# Patient Record
Sex: Female | Born: 1955 | Race: White | Hispanic: No | Marital: Married | Smoking: Never smoker
Health system: Southern US, Community
[De-identification: ages and names within clinical notes are randomized; demographics above are authoritative.]

## PROBLEM LIST (undated history)

## (undated) DIAGNOSIS — K859 Acute pancreatitis without necrosis or infection, unspecified: Secondary | ICD-10-CM

## (undated) DIAGNOSIS — E1142 Type 2 diabetes mellitus with diabetic polyneuropathy: Secondary | ICD-10-CM

## (undated) DIAGNOSIS — I4891 Unspecified atrial fibrillation: Secondary | ICD-10-CM

## (undated) DIAGNOSIS — E1143 Type 2 diabetes mellitus with diabetic autonomic (poly)neuropathy: Secondary | ICD-10-CM

## (undated) DIAGNOSIS — E119 Type 2 diabetes mellitus without complications: Secondary | ICD-10-CM

## (undated) DIAGNOSIS — Z992 Dependence on renal dialysis: Secondary | ICD-10-CM

## (undated) DIAGNOSIS — I10 Essential (primary) hypertension: Secondary | ICD-10-CM

## (undated) DIAGNOSIS — I219 Acute myocardial infarction, unspecified: Secondary | ICD-10-CM

## (undated) DIAGNOSIS — J449 Chronic obstructive pulmonary disease, unspecified: Secondary | ICD-10-CM

## (undated) DIAGNOSIS — N189 Chronic kidney disease, unspecified: Secondary | ICD-10-CM

## (undated) DIAGNOSIS — I2510 Atherosclerotic heart disease of native coronary artery without angina pectoris: Secondary | ICD-10-CM

## (undated) DIAGNOSIS — I509 Heart failure, unspecified: Secondary | ICD-10-CM

## (undated) DIAGNOSIS — K219 Gastro-esophageal reflux disease without esophagitis: Secondary | ICD-10-CM

## (undated) DIAGNOSIS — N289 Disorder of kidney and ureter, unspecified: Secondary | ICD-10-CM

## (undated) HISTORY — PX: CORONARY ANGIOPLASTY: SHX604

## (undated) HISTORY — PX: PORTACATH PLACEMENT: SHX2246

## (undated) HISTORY — PX: ABDOMINAL HYSTERECTOMY: SHX81

## (undated) HISTORY — PX: CHOLECYSTECTOMY: SHX55

## (undated) HISTORY — PX: EYE SURGERY: SHX253

## (undated) HISTORY — PX: BACK SURGERY: SHX140

---

## 2004-06-01 ENCOUNTER — Ambulatory Visit: Payer: Self-pay

## 2004-10-16 ENCOUNTER — Inpatient Hospital Stay: Payer: Self-pay | Admitting: Internal Medicine

## 2004-11-14 ENCOUNTER — Inpatient Hospital Stay: Payer: Self-pay | Admitting: Internal Medicine

## 2004-12-16 ENCOUNTER — Ambulatory Visit: Payer: Self-pay | Admitting: Internal Medicine

## 2005-01-04 ENCOUNTER — Ambulatory Visit: Payer: Self-pay

## 2005-02-21 ENCOUNTER — Encounter: Admission: RE | Admit: 2005-02-21 | Discharge: 2005-02-21 | Payer: Self-pay | Admitting: Neurosurgery

## 2005-03-07 ENCOUNTER — Encounter: Admission: RE | Admit: 2005-03-07 | Discharge: 2005-03-07 | Payer: Self-pay | Admitting: Neurosurgery

## 2005-03-28 ENCOUNTER — Encounter: Admission: RE | Admit: 2005-03-28 | Discharge: 2005-03-28 | Payer: Self-pay | Admitting: Neurosurgery

## 2005-04-09 ENCOUNTER — Encounter: Payer: Self-pay | Admitting: General Practice

## 2005-04-17 ENCOUNTER — Encounter: Payer: Self-pay | Admitting: General Practice

## 2005-05-04 ENCOUNTER — Ambulatory Visit (HOSPITAL_COMMUNITY): Admission: RE | Admit: 2005-05-04 | Discharge: 2005-05-05 | Payer: Self-pay | Admitting: Neurosurgery

## 2005-06-28 ENCOUNTER — Ambulatory Visit: Payer: Self-pay | Admitting: Neurosurgery

## 2005-10-03 ENCOUNTER — Encounter: Payer: Self-pay | Admitting: Neurosurgery

## 2005-10-16 ENCOUNTER — Encounter: Payer: Self-pay | Admitting: Neurosurgery

## 2005-11-01 ENCOUNTER — Ambulatory Visit: Payer: Self-pay

## 2005-11-15 ENCOUNTER — Encounter: Payer: Self-pay | Admitting: Neurosurgery

## 2006-11-08 ENCOUNTER — Ambulatory Visit: Payer: Self-pay

## 2007-11-27 ENCOUNTER — Ambulatory Visit: Payer: Self-pay

## 2007-12-04 ENCOUNTER — Emergency Department: Payer: Self-pay | Admitting: Emergency Medicine

## 2008-03-07 ENCOUNTER — Ambulatory Visit: Payer: Self-pay | Admitting: Neurosurgery

## 2008-03-21 ENCOUNTER — Ambulatory Visit (HOSPITAL_COMMUNITY): Admission: RE | Admit: 2008-03-21 | Discharge: 2008-03-22 | Payer: Self-pay | Admitting: Neurosurgery

## 2008-06-10 ENCOUNTER — Ambulatory Visit: Payer: Self-pay | Admitting: Ophthalmology

## 2008-06-18 ENCOUNTER — Ambulatory Visit: Payer: Self-pay | Admitting: Ophthalmology

## 2009-06-15 ENCOUNTER — Ambulatory Visit: Payer: Self-pay

## 2009-06-30 ENCOUNTER — Ambulatory Visit: Payer: Self-pay

## 2009-07-02 ENCOUNTER — Ambulatory Visit: Payer: Self-pay | Admitting: Gastroenterology

## 2009-07-22 ENCOUNTER — Ambulatory Visit: Payer: Self-pay | Admitting: Surgery

## 2009-07-28 ENCOUNTER — Emergency Department: Payer: Self-pay | Admitting: Internal Medicine

## 2009-08-13 ENCOUNTER — Ambulatory Visit: Payer: Self-pay | Admitting: Surgery

## 2009-09-13 ENCOUNTER — Emergency Department: Payer: Self-pay | Admitting: Emergency Medicine

## 2009-10-07 ENCOUNTER — Ambulatory Visit: Payer: Self-pay | Admitting: Ophthalmology

## 2009-10-14 ENCOUNTER — Ambulatory Visit: Payer: Self-pay | Admitting: Ophthalmology

## 2010-04-22 ENCOUNTER — Ambulatory Visit: Payer: Self-pay | Admitting: Neurosurgery

## 2010-07-06 ENCOUNTER — Ambulatory Visit: Payer: Self-pay

## 2010-08-07 ENCOUNTER — Emergency Department: Payer: Self-pay | Admitting: Emergency Medicine

## 2010-11-30 NOTE — Op Note (Signed)
[name], [name] NO.:  [id number]   MEDICAL RECORD NO.:  [id number]          PATIENT TYPE:  OIB   LOCATION:  3537                         FACILITY:  [facility]   PHYSICIAN:  Coletta Memos, M.D.     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   POSTOPERATIVE DIAGNOSIS:  Displaced disk, left L5-S1, which is  recurrent.   PROCEDURE:  Redo left L5-S1 diskectomy with microdissection.   COMPLICATIONS:  None.   SURGEON:  Coletta Memos, MD   ASSISTANT:  Hilda Lias, MD   ANESTHESIA:  General endotracheal.   INDICATIONS:  [name] presented with significant pain in the left  lower extremity.  MRI strongly suggested a recurrent disk herniation at  L5-S1.  I therefore offered and she agreed to undergo operative  decompression.   OPERATIVE NOTE:  [name] was brought to the operating room, intubated  and placed under general anesthetic.  She was rolled prone onto a Wilson  frame and all pressure points properly padded.  I infiltrated where old  incision with 0.5% lidocaine with 1:200,000 strength of epinephrine into  the lumbar region.  I opened the skin and took this down to the lamina  what I believed to be L5-S1.  Intraoperative x-ray confirm our location.  I then proceeded to drill away a small portion of L5 to make sure I had  greater access to the spinal canal.  I was able to remove ligament and  scar tissue and retract the thecal sac medially.  The microscope in  position and with microdissection, I was then able to perform a  diskectomy at L5-S1 on the left side.  Dr. Jeral Fruit assisted with the  decompression.  After diskectomy was performed, I irrigated.  I  inspected the nerve root and felt that there was no more pressure.  I  then closed the wound in layered fashion using Vicryl sutures  and  reapproximated thoracolumbar subcutaneous and subcuticular layers.           ______________________________  Coletta Memos, M.D.     KC/MEDQ  D:  04/17/2008  T:  04/18/2008  Job:  [id number]

## 2010-12-03 NOTE — Op Note (Signed)
NAMEDEBAR, PLATE                 ACCOUNT NO.:  [id number]   MEDICAL RECORD NO.:  [id number]          PATIENT TYPE:  AMB   LOCATION:  SDS                          FACILITY:  [facility]   PHYSICIAN:  Coletta Memos, M.D.     DATE OF BIRTH:  [birth year]   DATE OF PROCEDURE:  05/04/2005  DATE OF DISCHARGE:                                 OPERATIVE REPORT   PREOPERATIVE DIAGNOSES:  1.  Left L5-S1 displaced disk.  2.  Left S1 radiculopathy.   POSTOPERATIVE DIAGNOSES:  1.  Left L5-S1 displaced disk.  2.  Left S1 radiculopathy.   PROCEDURE:  Left L5-S1 diskectomy with  microdissection.   COMPLICATIONS:  None.   ASSISTANT:  Coletta Memos, M.D.   ASSISTANT:  Danae Orleans. Venetia Maxon, M.D.   ESTIMATED BLOOD LOSS:  Minimal.   ANESTHESIA:  General endotracheal.   INDICATIONS:  Shacarra Choe is a 55 year old who presented with severe pain in  the left lower extremity.  Time and conservative measures including epidural  steroid injections had not been helpful.  She therefore requested and I  agreed to perform the decompressive diskectomy for her.   OPERATIVE NOTE:  Ms. Collister was brought to the operating room, intubated and  placed under general anesthetic without difficulty.  She was rolled prone  onto a Wilson frame and all pressure points were properly padded.  I placed  a spinal needle for localization and after taking an x-ray infiltrated 10 mL  0.5% lidocaine and 1:200,000 strength into the lumbar region.  I opened the  skin with a #10 blade and took this down to the thoracolumbar fascia after  the patient had been prepped and draped sterilely, which was also done prior  to injection.  I exposed the lamina of what I believed to be L5 and S1.  I  placed a double-ended ganglion knife inferior to the superior lamina,  exposing my dissection site, operative field, and x-ray shows that I was at  L5-S1.  I then proceeded with diskectomy between L5 and S1 by removing the  ligamentum flavum, exposing the  thecal sac, and then retracting that  medially.  The microscope was brought into the operative field.  Then with  microdissection I was then able to remove the disk material with Dr. Fredrich Birks  assistance.  I opened the disk space with the #15 blade and proceeded to  perform a progressive diskectomy using pituitary rongeurs, Epstein curettes  and the Kerrison punches.  Removing a central portion of the disk, lateral  portion of the disk, I felt that I had decompressed the L5 and S1 nerve  roots quite well.  I then irrigated the wound.  I then closed the wound with  Dr. Fredrich Birks assistance in a layered fashion, reapproximating the  thoracolumbar fascial flap, which had been made in a semicircular fashion,  and then the subcutaneous tissues  and subcuticular layers.  Again, this patient was opened via a semicircular  flap in the thoracolumbar fascia and then I used the periosteal elevators to  expose the laminae of L5 and S1.  The patient had Dermabond used for a  sterile dressing.  She tolerated the procedure well.           ______________________________  Coletta Memos, M.D.     KC/MEDQ  D:  05/04/2005  T:  05/04/2005  Job:  [id number]

## 2011-04-20 LAB — CBC
HCT: 30.4 — ABNORMAL LOW
Hemoglobin: 10.3 — ABNORMAL LOW
MCHC: 33.7
MCV: 82.6
Platelets: 294
RBC: 3.68 — ABNORMAL LOW
RDW: 13.7
WBC: 9.3

## 2011-04-20 LAB — BASIC METABOLIC PANEL
BUN: 18
CO2: 27
Calcium: 9.3
Chloride: 100
Creatinine, Ser: 1
GFR calc Af Amer: >60
GFR calc non Af Amer: 58 — ABNORMAL LOW
Glucose, Bld: 98
Potassium: 3.4 — ABNORMAL LOW
Sodium: 137

## 2011-04-20 LAB — GLUCOSE, CAPILLARY
Glucose-Capillary: 103 — ABNORMAL HIGH
Glucose-Capillary: 155 — ABNORMAL HIGH
Glucose-Capillary: 178 — ABNORMAL HIGH
Glucose-Capillary: 290 — ABNORMAL HIGH
Glucose-Capillary: 328 — ABNORMAL HIGH

## 2011-06-08 ENCOUNTER — Ambulatory Visit: Payer: Self-pay | Admitting: Ophthalmology

## 2011-06-20 ENCOUNTER — Ambulatory Visit: Payer: Self-pay | Admitting: Ophthalmology

## 2011-09-22 ENCOUNTER — Ambulatory Visit: Payer: Self-pay

## 2011-10-25 ENCOUNTER — Emergency Department: Payer: Self-pay | Admitting: Emergency Medicine

## 2011-11-03 ENCOUNTER — Inpatient Hospital Stay: Payer: Self-pay | Admitting: Internal Medicine

## 2011-11-03 LAB — IRON AND TIBC
Iron: 28 ug/dL — ABNORMAL LOW (ref 50–170)
Unbound Iron-Bind.Cap.: 230 ug/dL

## 2011-11-03 LAB — COMPREHENSIVE METABOLIC PANEL
Albumin: 2.1 g/dL — ABNORMAL LOW (ref 3.4–5.0)
Alkaline Phosphatase: 201 U/L — ABNORMAL HIGH (ref 50–136)
BUN: 64 mg/dL — ABNORMAL HIGH (ref 7–18)
Bilirubin,Total: 0.2 mg/dL (ref 0.2–1.0)
Calcium, Total: 8.9 mg/dL (ref 8.5–10.1)
EGFR (African American): 11 — ABNORMAL LOW
EGFR (Non-African Amer.): 9 — ABNORMAL LOW
Potassium: 4.3 mmol/L (ref 3.5–5.1)
SGOT(AST): 38 U/L — ABNORMAL HIGH (ref 15–37)
SGPT (ALT): 48 U/L
Sodium: 133 mmol/L — ABNORMAL LOW (ref 136–145)
Total Protein: 8.2 g/dL (ref 6.4–8.2)

## 2011-11-03 LAB — URINALYSIS, COMPLETE
Bilirubin,UR: NEGATIVE
Glucose,UR: 150 mg/dL (ref 0–75)
Leukocyte Esterase: NEGATIVE
Protein: 100
RBC,UR: 2 /HPF (ref 0–5)
WBC UR: 2 /HPF (ref 0–5)

## 2011-11-03 LAB — CBC
HGB: 7.7 g/dL — ABNORMAL LOW (ref 12.0–16.0)
MCV: 78 fL — ABNORMAL LOW (ref 80–100)
Platelet: 360 10*3/uL (ref 150–440)
RBC: 3.23 10*6/uL — ABNORMAL LOW (ref 3.80–5.20)
WBC: 20.5 10*3/uL — ABNORMAL HIGH (ref 3.6–11.0)

## 2011-11-03 LAB — FERRITIN: Ferritin (ARMC): 211 ng/mL (ref 8–388)

## 2011-11-04 LAB — LIPID PANEL
Cholesterol: 77 mg/dL (ref 0–200)
HDL Cholesterol: 15 mg/dL — ABNORMAL LOW (ref 40–60)
Ldl Cholesterol, Calc: 25 mg/dL (ref 0–100)
Triglycerides: 183 mg/dL (ref 0–200)
VLDL Cholesterol, Calc: 37 mg/dL (ref 5–40)

## 2011-11-04 LAB — BASIC METABOLIC PANEL
Anion Gap: 14 (ref 7–16)
BUN: 65 mg/dL — ABNORMAL HIGH (ref 7–18)
Chloride: 104 mmol/L (ref 98–107)
Co2: 19 mmol/L — ABNORMAL LOW (ref 21–32)
Creatinine: 4.76 mg/dL — ABNORMAL HIGH (ref 0.60–1.30)
EGFR (Non-African Amer.): 9 — ABNORMAL LOW
Glucose: 141 mg/dL — ABNORMAL HIGH (ref 65–99)
Osmolality: 295 (ref 275–301)
Potassium: 3.7 mmol/L (ref 3.5–5.1)

## 2011-11-04 LAB — CBC WITH DIFFERENTIAL/PLATELET
Basophil #: 0.1 10*3/uL (ref 0.0–0.1)
Basophil %: 0.4 %
HGB: 8.1 g/dL — ABNORMAL LOW (ref 12.0–16.0)
Lymphocyte %: 8.2 %
MCH: 24.9 pg — ABNORMAL LOW (ref 26.0–34.0)
MCHC: 31.7 g/dL — ABNORMAL LOW (ref 32.0–36.0)
Monocyte #: 1.1 x10 3/mm — ABNORMAL HIGH (ref 0.2–0.9)
Neutrophil #: 11.9 10*3/uL — ABNORMAL HIGH (ref 1.4–6.5)
Neutrophil %: 78.4 %
Platelet: 321 10*3/uL (ref 150–440)
RBC: 3.27 10*6/uL — ABNORMAL LOW (ref 3.80–5.20)
RDW: 15.3 % — ABNORMAL HIGH (ref 11.5–14.5)
WBC: 15.2 10*3/uL — ABNORMAL HIGH (ref 3.6–11.0)

## 2011-11-05 ENCOUNTER — Ambulatory Visit: Payer: Self-pay | Admitting: Urology

## 2011-11-05 LAB — CBC WITH DIFFERENTIAL/PLATELET
Basophil #: 0.1 10*3/uL (ref 0.0–0.1)
Basophil %: 0.7 %
MCH: 25.2 pg — ABNORMAL LOW (ref 26.0–34.0)
MCHC: 32.1 g/dL (ref 32.0–36.0)
MCV: 78 fL — ABNORMAL LOW (ref 80–100)
Monocyte #: 1.2 x10 3/mm — ABNORMAL HIGH (ref 0.2–0.9)
Monocyte %: 7 %
Neutrophil #: 13.4 10*3/uL — ABNORMAL HIGH (ref 1.4–6.5)
Neutrophil %: 80.5 %
Platelet: 335 10*3/uL (ref 150–440)
RBC: 3.26 10*6/uL — ABNORMAL LOW (ref 3.80–5.20)
WBC: 16.6 10*3/uL — ABNORMAL HIGH (ref 3.6–11.0)

## 2011-11-05 LAB — BASIC METABOLIC PANEL
Anion Gap: 15 (ref 7–16)
Calcium, Total: 8.7 mg/dL (ref 8.5–10.1)
Creatinine: 4.47 mg/dL — ABNORMAL HIGH (ref 0.60–1.30)
EGFR (African American): 12 — ABNORMAL LOW
EGFR (Non-African Amer.): 10 — ABNORMAL LOW
Osmolality: 296 (ref 275–301)
Potassium: 3.9 mmol/L (ref 3.5–5.1)

## 2011-11-06 LAB — BASIC METABOLIC PANEL
BUN: 48 mg/dL — ABNORMAL HIGH (ref 7–18)
Calcium, Total: 8.8 mg/dL (ref 8.5–10.1)
Chloride: 107 mmol/L (ref 98–107)
Creatinine: 3.66 mg/dL — ABNORMAL HIGH (ref 0.60–1.30)
Glucose: 177 mg/dL — ABNORMAL HIGH (ref 65–99)
Potassium: 3.9 mmol/L (ref 3.5–5.1)

## 2011-11-06 LAB — PROTEIN / CREATININE RATIO, URINE: Creatinine, Urine: 51.4 mg/dL (ref 30.0–125.0)

## 2011-11-07 LAB — CBC WITH DIFFERENTIAL/PLATELET
Eosinophil #: 0.5 10*3/uL (ref 0.0–0.7)
Eosinophil %: 3.2 %
HCT: 28.1 % — ABNORMAL LOW (ref 35.0–47.0)
HGB: 8.7 g/dL — ABNORMAL LOW (ref 12.0–16.0)
Lymphocyte #: 1.6 10*3/uL (ref 1.0–3.6)
Lymphocyte %: 9.5 %
MCHC: 31 g/dL — ABNORMAL LOW (ref 32.0–36.0)
MCV: 79 fL — ABNORMAL LOW (ref 80–100)
Monocyte #: 0.9 x10 3/mm (ref 0.2–0.9)
Monocyte %: 5.4 %
Neutrophil #: 13.5 10*3/uL — ABNORMAL HIGH (ref 1.4–6.5)
Neutrophil %: 81.4 %
RBC: 3.58 10*6/uL — ABNORMAL LOW (ref 3.80–5.20)
RDW: 15.2 % — ABNORMAL HIGH (ref 11.5–14.5)

## 2011-11-07 LAB — BASIC METABOLIC PANEL
Co2: 16 mmol/L — ABNORMAL LOW (ref 21–32)
EGFR (African American): 17 — ABNORMAL LOW
Glucose: 170 mg/dL — ABNORMAL HIGH (ref 65–99)
Potassium: 3.6 mmol/L (ref 3.5–5.1)
Sodium: 140 mmol/L (ref 136–145)

## 2011-11-07 LAB — PROTEIN ELECTROPHORESIS(ARMC)

## 2011-11-08 LAB — CBC WITH DIFFERENTIAL/PLATELET
Basophil #: 0.1 10*3/uL (ref 0.0–0.1)
Basophil %: 0.6 %
Eosinophil #: 0.4 10*3/uL (ref 0.0–0.7)
Eosinophil %: 2.1 %
HCT: 27 % — ABNORMAL LOW (ref 35.0–47.0)
Lymphocyte #: 1.8 10*3/uL (ref 1.0–3.6)
MCH: 24.9 pg — ABNORMAL LOW (ref 26.0–34.0)
MCHC: 31.5 g/dL — ABNORMAL LOW (ref 32.0–36.0)
MCV: 79 fL — ABNORMAL LOW (ref 80–100)
Monocyte %: 5.7 %
Neutrophil #: 13.8 10*3/uL — ABNORMAL HIGH (ref 1.4–6.5)
Neutrophil %: 80.9 %
Platelet: 348 10*3/uL (ref 150–440)
RBC: 3.43 10*6/uL — ABNORMAL LOW (ref 3.80–5.20)
RDW: 15.5 % — ABNORMAL HIGH (ref 11.5–14.5)

## 2011-11-08 LAB — BASIC METABOLIC PANEL
Calcium, Total: 8.7 mg/dL (ref 8.5–10.1)
Chloride: 111 mmol/L — ABNORMAL HIGH (ref 98–107)
EGFR (African American): 21 — ABNORMAL LOW
Glucose: 182 mg/dL — ABNORMAL HIGH (ref 65–99)
Osmolality: 295 (ref 275–301)
Potassium: 3.8 mmol/L (ref 3.5–5.1)
Sodium: 140 mmol/L (ref 136–145)

## 2011-11-08 LAB — UR PROT ELECTROPHORESIS, URINE RANDOM

## 2011-11-09 LAB — CBC WITH DIFFERENTIAL/PLATELET
Basophil #: 0.1 10*3/uL (ref 0.0–0.1)
Basophil %: 0.4 %
Eosinophil #: 0.4 10*3/uL (ref 0.0–0.7)
HCT: 25.1 % — ABNORMAL LOW (ref 35.0–47.0)
Lymphocyte #: 1.6 10*3/uL (ref 1.0–3.6)
MCHC: 31.5 g/dL — ABNORMAL LOW (ref 32.0–36.0)
Monocyte #: 0.9 x10 3/mm (ref 0.2–0.9)
Monocyte %: 5.4 %
Neutrophil #: 14 10*3/uL — ABNORMAL HIGH (ref 1.4–6.5)
Neutrophil %: 82.4 %
Platelet: 341 10*3/uL (ref 150–440)
RBC: 3.2 10*6/uL — ABNORMAL LOW (ref 3.80–5.20)
WBC: 17 10*3/uL — ABNORMAL HIGH (ref 3.6–11.0)

## 2011-11-09 LAB — BASIC METABOLIC PANEL
Anion Gap: 12 (ref 7–16)
BUN: 41 mg/dL — ABNORMAL HIGH (ref 7–18)
Calcium, Total: 8.4 mg/dL — ABNORMAL LOW (ref 8.5–10.1)
Co2: 17 mmol/L — ABNORMAL LOW (ref 21–32)
EGFR (African American): 22 — ABNORMAL LOW
EGFR (Non-African Amer.): 19 — ABNORMAL LOW
Potassium: 3.9 mmol/L (ref 3.5–5.1)

## 2011-12-17 ENCOUNTER — Ambulatory Visit: Payer: Self-pay | Admitting: Urology

## 2011-12-23 ENCOUNTER — Inpatient Hospital Stay: Payer: Self-pay | Admitting: Internal Medicine

## 2011-12-23 LAB — BASIC METABOLIC PANEL
Calcium, Total: 8.7 mg/dL (ref 8.5–10.1)
Chloride: 110 mmol/L — ABNORMAL HIGH (ref 98–107)
Co2: 18 mmol/L — ABNORMAL LOW (ref 21–32)
Creatinine: 3.21 mg/dL — ABNORMAL HIGH (ref 0.60–1.30)
EGFR (African American): 18 — ABNORMAL LOW
EGFR (Non-African Amer.): 15 — ABNORMAL LOW
Osmolality: 286 (ref 275–301)

## 2011-12-23 LAB — CBC
HCT: 24.2 % — ABNORMAL LOW
HGB: 7.3 g/dL — ABNORMAL LOW
MCH: 24.2 pg — ABNORMAL LOW
MCHC: 30.3 g/dL — ABNORMAL LOW
MCV: 80 fL
Platelet: 276 10*3/uL
RBC: 3.02 X10 6/mm 3 — ABNORMAL LOW
RDW: 19.2 % — ABNORMAL HIGH
WBC: 9.1 10*3/uL

## 2011-12-23 LAB — TROPONIN I: Troponin-I: 2.72 ng/mL — ABNORMAL HIGH

## 2011-12-23 LAB — CK TOTAL AND CKMB (NOT AT ARMC)
CK, Total: 101 U/L
CK-MB: 7.9 ng/mL — ABNORMAL HIGH

## 2011-12-24 ENCOUNTER — Ambulatory Visit: Payer: Self-pay | Admitting: Urology

## 2011-12-24 LAB — CBC WITH DIFFERENTIAL/PLATELET
Basophil #: 0.1 10*3/uL (ref 0.0–0.1)
Basophil %: 1.4 %
HCT: 26.9 % — ABNORMAL LOW (ref 35.0–47.0)
HGB: 8.6 g/dL — ABNORMAL LOW (ref 12.0–16.0)
Lymphocyte #: 1.8 10*3/uL (ref 1.0–3.6)
MCH: 25.8 pg — ABNORMAL LOW (ref 26.0–34.0)
MCHC: 31.9 g/dL — ABNORMAL LOW (ref 32.0–36.0)
MCV: 81 fL (ref 80–100)
Monocyte #: 0.5 x10 3/mm (ref 0.2–0.9)
Monocyte %: 6.5 %
Neutrophil %: 60.3 %
Platelet: 250 10*3/uL (ref 150–440)
RDW: 18.8 % — ABNORMAL HIGH (ref 11.5–14.5)
WBC: 8.2 10*3/uL (ref 3.6–11.0)

## 2011-12-24 LAB — COMPREHENSIVE METABOLIC PANEL
Albumin: 2.6 g/dL — ABNORMAL LOW (ref 3.4–5.0)
Anion Gap: 10 (ref 7–16)
Calcium, Total: 8.6 mg/dL (ref 8.5–10.1)
Chloride: 114 mmol/L — ABNORMAL HIGH (ref 98–107)
Creatinine: 3.08 mg/dL — ABNORMAL HIGH (ref 0.60–1.30)
EGFR (Non-African Amer.): 16 — ABNORMAL LOW
SGOT(AST): 20 U/L (ref 15–37)
SGPT (ALT): 39 U/L

## 2011-12-24 LAB — CK TOTAL AND CKMB (NOT AT ARMC)
CK, Total: 83 U/L (ref 21–215)
CK-MB: 6.3 ng/mL — ABNORMAL HIGH (ref 0.5–3.6)
CK-MB: 5.5 ng/mL — ABNORMAL HIGH (ref 0.5–3.6)

## 2011-12-24 LAB — TROPONIN I: Troponin-I: 2.32 ng/mL — ABNORMAL HIGH

## 2011-12-25 LAB — BASIC METABOLIC PANEL
Calcium, Total: 9 mg/dL (ref 8.5–10.1)
Chloride: 109 mmol/L — ABNORMAL HIGH (ref 98–107)
Co2: 20 mmol/L — ABNORMAL LOW (ref 21–32)
Creatinine: 2.8 mg/dL — ABNORMAL HIGH (ref 0.60–1.30)
EGFR (African American): 21 — ABNORMAL LOW
EGFR (Non-African Amer.): 18 — ABNORMAL LOW
Potassium: 4.6 mmol/L (ref 3.5–5.1)
Sodium: 142 mmol/L (ref 136–145)

## 2011-12-25 LAB — CBC WITH DIFFERENTIAL/PLATELET
Basophil #: 0.1 10*3/uL (ref 0.0–0.1)
HCT: 32 % — ABNORMAL LOW (ref 35.0–47.0)
HGB: 10.4 g/dL — ABNORMAL LOW (ref 12.0–16.0)
Lymphocyte #: 2 10*3/uL (ref 1.0–3.6)
Lymphocyte %: 23.7 %
MCHC: 32.4 g/dL (ref 32.0–36.0)
Monocyte %: 6.6 %
Neutrophil #: 4.9 10*3/uL (ref 1.4–6.5)
Neutrophil %: 58.4 %
RDW: 18.5 % — ABNORMAL HIGH (ref 11.5–14.5)

## 2011-12-26 LAB — BASIC METABOLIC PANEL
Anion Gap: 9 (ref 7–16)
Calcium, Total: 9.1 mg/dL (ref 8.5–10.1)
Chloride: 108 mmol/L — ABNORMAL HIGH (ref 98–107)
Co2: 23 mmol/L (ref 21–32)
Creatinine: 2.95 mg/dL — ABNORMAL HIGH (ref 0.60–1.30)
EGFR (Non-African Amer.): 17 — ABNORMAL LOW
Glucose: 239 mg/dL — ABNORMAL HIGH (ref 65–99)
Osmolality: 293 (ref 275–301)
Potassium: 4.4 mmol/L (ref 3.5–5.1)
Sodium: 140 mmol/L (ref 136–145)

## 2012-01-07 ENCOUNTER — Emergency Department: Payer: Self-pay | Admitting: Internal Medicine

## 2012-01-26 ENCOUNTER — Ambulatory Visit: Payer: Self-pay | Admitting: Vascular Surgery

## 2012-01-26 LAB — BASIC METABOLIC PANEL
BUN: 78 mg/dL — ABNORMAL HIGH (ref 7–18)
Calcium, Total: 8.5 mg/dL (ref 8.5–10.1)
Chloride: 106 mmol/L (ref 98–107)
Co2: 18 mmol/L — ABNORMAL LOW (ref 21–32)
Creatinine: 4.71 mg/dL — ABNORMAL HIGH (ref 0.60–1.30)
EGFR (African American): 11 — ABNORMAL LOW
Glucose: 305 mg/dL — ABNORMAL HIGH (ref 65–99)
Osmolality: 305 (ref 275–301)

## 2012-01-26 LAB — CBC
HCT: 28.2 % — ABNORMAL LOW (ref 35.0–47.0)
HGB: 9.1 g/dL — ABNORMAL LOW (ref 12.0–16.0)
MCH: 26.8 pg (ref 26.0–34.0)
RBC: 3.38 10*6/uL — ABNORMAL LOW (ref 3.80–5.20)

## 2012-02-02 ENCOUNTER — Ambulatory Visit: Payer: Self-pay | Admitting: Vascular Surgery

## 2012-04-24 ENCOUNTER — Other Ambulatory Visit: Payer: Self-pay | Admitting: Urology

## 2012-04-24 LAB — CBC WITH DIFFERENTIAL/PLATELET
Basophil #: 0.2 10*3/uL — ABNORMAL HIGH (ref 0.0–0.1)
Eosinophil #: 0.9 10*3/uL — ABNORMAL HIGH (ref 0.0–0.7)
HGB: 10.9 g/dL — ABNORMAL LOW (ref 12.0–16.0)
Lymphocyte #: 2.7 10*3/uL (ref 1.0–3.6)
Lymphocyte %: 24.5 %
MCV: 85 fL (ref 80–100)
Monocyte %: 7.5 %
Neutrophil #: 6.4 10*3/uL (ref 1.4–6.5)
Neutrophil %: 57.5 %
Platelet: 236 10*3/uL (ref 150–440)
RBC: 3.94 10*6/uL (ref 3.80–5.20)
RDW: 14.5 % (ref 11.5–14.5)
WBC: 11.1 10*3/uL — ABNORMAL HIGH (ref 3.6–11.0)

## 2012-04-24 LAB — APTT: Activated PTT: 34.9 secs (ref 23.6–35.9)

## 2012-04-24 LAB — PROTIME-INR: Prothrombin Time: 13 secs (ref 11.5–14.7)

## 2012-04-26 ENCOUNTER — Emergency Department: Payer: Self-pay | Admitting: Internal Medicine

## 2012-04-26 LAB — COMPREHENSIVE METABOLIC PANEL
Alkaline Phosphatase: 142 U/L — ABNORMAL HIGH (ref 50–136)
Bilirubin,Total: 0.2 mg/dL (ref 0.2–1.0)
Calcium, Total: 9 mg/dL (ref 8.5–10.1)
Co2: 25 mmol/L (ref 21–32)
Creatinine: 4.6 mg/dL — ABNORMAL HIGH (ref 0.60–1.30)
EGFR (Non-African Amer.): 10 — ABNORMAL LOW
Glucose: 103 mg/dL — ABNORMAL HIGH (ref 65–99)
SGOT(AST): 18 U/L (ref 15–37)
SGPT (ALT): 27 U/L (ref 12–78)

## 2012-04-26 LAB — CBC
MCH: 28.7 pg (ref 26.0–34.0)
MCHC: 33.9 g/dL (ref 32.0–36.0)
Platelet: 272 10*3/uL (ref 150–440)
RBC: 3.96 10*6/uL (ref 3.80–5.20)
WBC: 11.9 10*3/uL — ABNORMAL HIGH (ref 3.6–11.0)

## 2012-05-11 ENCOUNTER — Ambulatory Visit: Payer: Self-pay | Admitting: Urology

## 2012-05-11 LAB — PLATELET COUNT: Platelet: 278 10*3/uL (ref 150–440)

## 2012-05-11 LAB — PROTIME-INR: INR: 0.9

## 2012-05-11 LAB — APTT: Activated PTT: 31.2 secs (ref 23.6–35.9)

## 2012-05-14 LAB — PATHOLOGY REPORT

## 2012-05-31 ENCOUNTER — Ambulatory Visit: Payer: Self-pay | Admitting: Urology

## 2012-06-12 ENCOUNTER — Ambulatory Visit: Payer: Self-pay | Admitting: Urology

## 2012-09-20 ENCOUNTER — Ambulatory Visit: Payer: Self-pay | Admitting: Urology

## 2012-10-09 ENCOUNTER — Ambulatory Visit: Payer: Self-pay

## 2013-02-14 ENCOUNTER — Emergency Department: Payer: Self-pay | Admitting: Emergency Medicine

## 2013-03-07 ENCOUNTER — Emergency Department: Payer: Self-pay | Admitting: Emergency Medicine

## 2013-04-10 ENCOUNTER — Ambulatory Visit: Payer: Self-pay | Admitting: Nephrology

## 2013-10-29 ENCOUNTER — Inpatient Hospital Stay: Payer: Self-pay | Admitting: Internal Medicine

## 2013-10-29 LAB — COMPREHENSIVE METABOLIC PANEL
ALBUMIN: 2.5 g/dL — AB (ref 3.4–5.0)
ALK PHOS: 146 U/L — AB
Anion Gap: 6 — ABNORMAL LOW (ref 7–16)
BUN: 38 mg/dL — ABNORMAL HIGH (ref 7–18)
Bilirubin,Total: 0.7 mg/dL (ref 0.2–1.0)
CO2: 31 mmol/L (ref 21–32)
Calcium, Total: 8.5 mg/dL (ref 8.5–10.1)
Chloride: 96 mmol/L — ABNORMAL LOW (ref 98–107)
Creatinine: 4.3 mg/dL — ABNORMAL HIGH (ref 0.60–1.30)
EGFR (African American): 12 — ABNORMAL LOW
EGFR (Non-African Amer.): 11 — ABNORMAL LOW
Glucose: 280 mg/dL — ABNORMAL HIGH (ref 65–99)
OSMOLALITY: 286 (ref 275–301)
POTASSIUM: 3.5 mmol/L (ref 3.5–5.1)
SGOT(AST): 11 U/L — ABNORMAL LOW (ref 15–37)
SGPT (ALT): 17 U/L (ref 12–78)
Sodium: 133 mmol/L — ABNORMAL LOW (ref 136–145)
TOTAL PROTEIN: 7.3 g/dL (ref 6.4–8.2)

## 2013-10-29 LAB — CBC
HCT: 31.3 % — ABNORMAL LOW (ref 35.0–47.0)
HGB: 10.2 g/dL — ABNORMAL LOW (ref 12.0–16.0)
MCH: 29.6 pg (ref 26.0–34.0)
MCHC: 32.4 g/dL (ref 32.0–36.0)
MCV: 91 fL (ref 80–100)
PLATELETS: 191 10*3/uL (ref 150–440)
RBC: 3.43 10*6/uL — ABNORMAL LOW (ref 3.80–5.20)
RDW: 14.2 % (ref 11.5–14.5)
WBC: 14.3 10*3/uL — AB (ref 3.6–11.0)

## 2013-10-29 LAB — URINALYSIS, COMPLETE
BILIRUBIN, UR: NEGATIVE
Glucose,UR: 50 mg/dL (ref 0–75)
KETONE: NEGATIVE
Nitrite: NEGATIVE
Ph: 7 (ref 4.5–8.0)
Protein: 100
RBC,UR: 1 /HPF (ref 0–5)
SPECIFIC GRAVITY: 1.008 (ref 1.003–1.030)
Squamous Epithelial: 6
WBC UR: 67 /HPF (ref 0–5)

## 2013-10-29 LAB — GLUCOSE, SEROUS FLUID: Glucose, Body Fluid: 890 mg/dL

## 2013-10-29 LAB — PROTEIN, BODY FLUID: PROTEIN, BODY FLUID: 0.2 g/dL

## 2013-10-29 LAB — LACTATE DEHYDROGENASE, PLEURAL OR PERITONEAL FLUID: LDH, Body Fluid: <6 U/L — ABNORMAL LOW

## 2013-10-29 LAB — AMYLASE, BODY FLUID: Amylase, Body Fluid: <2 U/L

## 2013-10-29 LAB — LIPASE, BLOOD: LIPASE: 140 U/L (ref 73–393)

## 2013-10-30 LAB — BODY FLUID CELL COUNT WITH DIFFERENTIAL
Basophil: 0 %
EOS PCT: 5 %
Lymphocytes: 38 %
Lymphocytes: 4 %
NEUTROS PCT: 60 %
NUCLEATED CELL COUNT: 320 /mm3
Neutrophils: 87 %
Nucleated Cell Count: 11433 /mm3
OTHER CELLS BF: 0 %
OTHER MONONUCLEAR CELLS: 2 %
Other Mononuclear Cells: 4 %

## 2013-10-31 LAB — BASIC METABOLIC PANEL
Anion Gap: 9 (ref 7–16)
BUN: 41 mg/dL — ABNORMAL HIGH (ref 7–18)
CO2: 26 mmol/L (ref 21–32)
Calcium, Total: 8.3 mg/dL — ABNORMAL LOW (ref 8.5–10.1)
Chloride: 101 mmol/L (ref 98–107)
Creatinine: 4.59 mg/dL — ABNORMAL HIGH (ref 0.60–1.30)
EGFR (African American): 11 — ABNORMAL LOW
EGFR (Non-African Amer.): 10 — ABNORMAL LOW
Glucose: 106 mg/dL — ABNORMAL HIGH (ref 65–99)
OSMOLALITY: 282 (ref 275–301)
Potassium: 3.4 mmol/L — ABNORMAL LOW (ref 3.5–5.1)
SODIUM: 136 mmol/L (ref 136–145)

## 2013-10-31 LAB — CBC WITH DIFFERENTIAL/PLATELET
Basophil #: 0 10*3/uL (ref 0.0–0.1)
Basophil %: 0.4 %
EOS ABS: 0.3 10*3/uL (ref 0.0–0.7)
EOS PCT: 3.1 %
HCT: 30.1 % — ABNORMAL LOW (ref 35.0–47.0)
HGB: 10.1 g/dL — AB (ref 12.0–16.0)
Lymphocyte #: 1.1 10*3/uL (ref 1.0–3.6)
Lymphocyte %: 10.6 %
MCH: 30.5 pg (ref 26.0–34.0)
MCHC: 33.6 g/dL (ref 32.0–36.0)
MCV: 91 fL (ref 80–100)
MONOS PCT: 6.8 %
Monocyte #: 0.7 x10 3/mm (ref 0.2–0.9)
NEUTROS ABS: 8.1 10*3/uL — AB (ref 1.4–6.5)
Neutrophil %: 79.1 %
Platelet: 211 10*3/uL (ref 150–440)
RBC: 3.32 10*6/uL — ABNORMAL LOW (ref 3.80–5.20)
RDW: 14.3 % (ref 11.5–14.5)
WBC: 10.2 10*3/uL (ref 3.6–11.0)

## 2013-10-31 LAB — URINE CULTURE

## 2013-11-01 LAB — BODY FLUID CELL COUNT WITH DIFFERENTIAL
Basophil: 0 %
Eosinophil: 5 %
Lymphocytes: 14 %
NEUTROS PCT: 67 %
Nucleated Cell Count: 585 /mm3
OTHER CELLS BF: 0 %
OTHER MONONUCLEAR CELLS: 14 %

## 2013-11-01 LAB — VANCOMYCIN, TROUGH: Vancomycin, Trough: 13 ug/mL (ref 10–20)

## 2013-11-01 LAB — PHOSPHORUS: Phosphorus: 4.8 mg/dL (ref 2.5–4.9)

## 2013-11-02 LAB — BODY FLUID CELL COUNT WITH DIFFERENTIAL
Basophil: 0 %
EOS PCT: 7 %
Lymphocytes: 21 %
NUCLEATED CELL COUNT: 567 /mm3
Neutrophils: 59 %
OTHER CELLS BF: 0 %
OTHER MONONUCLEAR CELLS: 13 %

## 2013-11-02 LAB — BODY FLUID CULTURE

## 2013-11-03 LAB — BODY FLUID CELL COUNT WITH DIFFERENTIAL
Basophil: 0 %
Eosinophil: 15 %
Lymphocytes: 33 %
NUCLEATED CELL COUNT: 421 /mm3
Neutrophils: 43 %
OTHER CELLS BF: 0 %
OTHER MONONUCLEAR CELLS: 9 %

## 2013-11-03 LAB — PHOSPHORUS: Phosphorus: 4.5 mg/dL (ref 2.5–4.9)

## 2013-11-03 LAB — BODY FLUID CULTURE

## 2013-11-03 LAB — CULTURE, BLOOD (SINGLE)

## 2013-11-03 LAB — VANCOMYCIN, TROUGH: Vancomycin, Trough: 24 ug/mL (ref 10–20)

## 2013-11-04 LAB — CBC WITH DIFFERENTIAL/PLATELET
BASOS ABS: 0.1 10*3/uL (ref 0.0–0.1)
Basophil %: 0.9 %
Eosinophil #: 0.4 10*3/uL (ref 0.0–0.7)
Eosinophil %: 4.8 %
HCT: 26.9 % — AB (ref 35.0–47.0)
HGB: 8.8 g/dL — ABNORMAL LOW (ref 12.0–16.0)
Lymphocyte #: 1.4 10*3/uL (ref 1.0–3.6)
Lymphocyte %: 15.7 %
MCH: 29.4 pg (ref 26.0–34.0)
MCHC: 32.6 g/dL (ref 32.0–36.0)
MCV: 90 fL (ref 80–100)
MONOS PCT: 9.1 %
Monocyte #: 0.8 x10 3/mm (ref 0.2–0.9)
NEUTROS ABS: 6 10*3/uL (ref 1.4–6.5)
Neutrophil %: 69.5 %
Platelet: 278 10*3/uL (ref 150–440)
RBC: 2.98 10*6/uL — AB (ref 3.80–5.20)
RDW: 14 % (ref 11.5–14.5)
WBC: 8.6 10*3/uL (ref 3.6–11.0)

## 2013-11-05 LAB — BODY FLUID CULTURE

## 2013-11-06 LAB — BODY FLUID CULTURE

## 2013-11-07 LAB — BODY FLUID CULTURE

## 2013-11-22 ENCOUNTER — Ambulatory Visit: Payer: Self-pay

## 2014-05-09 ENCOUNTER — Inpatient Hospital Stay: Payer: Self-pay | Admitting: Internal Medicine

## 2014-05-09 LAB — URINALYSIS, COMPLETE
Bilirubin,UR: NEGATIVE
Glucose,UR: NEGATIVE mg/dL (ref 0–75)
Ketone: NEGATIVE
Leukocyte Esterase: NEGATIVE
Nitrite: NEGATIVE
Ph: 7 (ref 4.5–8.0)
Protein: 100
RBC,UR: 1 /HPF (ref 0–5)
Specific Gravity: 1.005 (ref 1.003–1.030)
Squamous Epithelial: 1
WBC UR: 1 /HPF (ref 0–5)

## 2014-05-09 LAB — CBC WITH DIFFERENTIAL/PLATELET
BASOS ABS: 0.1 10*3/uL (ref 0.0–0.1)
BASOS PCT: 0.6 %
EOS PCT: 2.2 %
Eosinophil #: 0.2 10*3/uL (ref 0.0–0.7)
HCT: 31.8 % — AB (ref 35.0–47.0)
HGB: 9.9 g/dL — ABNORMAL LOW (ref 12.0–16.0)
LYMPHS PCT: 11.8 %
Lymphocyte #: 1.3 10*3/uL (ref 1.0–3.6)
MCH: 27.4 pg (ref 26.0–34.0)
MCHC: 31.2 g/dL — ABNORMAL LOW (ref 32.0–36.0)
MCV: 88 fL (ref 80–100)
Monocyte #: 0.6 x10 3/mm (ref 0.2–0.9)
Monocyte %: 5.3 %
NEUTROS ABS: 8.9 10*3/uL — AB (ref 1.4–6.5)
Neutrophil %: 80.1 %
Platelet: 305 10*3/uL (ref 150–440)
RBC: 3.63 10*6/uL — ABNORMAL LOW (ref 3.80–5.20)
RDW: 14.9 % — ABNORMAL HIGH (ref 11.5–14.5)
WBC: 11.1 10*3/uL — AB (ref 3.6–11.0)

## 2014-05-09 LAB — COMPREHENSIVE METABOLIC PANEL
ALBUMIN: 2.3 g/dL — AB (ref 3.4–5.0)
ANION GAP: 9 (ref 7–16)
AST: 22 U/L (ref 15–37)
Alkaline Phosphatase: 115 U/L
BUN: 35 mg/dL — AB (ref 7–18)
Bilirubin,Total: 0.4 mg/dL (ref 0.2–1.0)
CHLORIDE: 103 mmol/L (ref 98–107)
CO2: 24 mmol/L (ref 21–32)
Calcium, Total: 8.3 mg/dL — ABNORMAL LOW (ref 8.5–10.1)
Creatinine: 3.96 mg/dL — ABNORMAL HIGH (ref 0.60–1.30)
EGFR (Non-African Amer.): 12 — ABNORMAL LOW
GFR CALC AF AMER: 15 — AB
Glucose: 74 mg/dL (ref 65–99)
OSMOLALITY: 279 (ref 275–301)
Potassium: 3.6 mmol/L (ref 3.5–5.1)
SGPT (ALT): 20 U/L
Sodium: 136 mmol/L (ref 136–145)
TOTAL PROTEIN: 7.3 g/dL (ref 6.4–8.2)

## 2014-05-09 LAB — TROPONIN I
TROPONIN-I: 1.7 ng/mL — AB
Troponin-I: 1.6 ng/mL — ABNORMAL HIGH

## 2014-05-09 LAB — CK-MB
CK-MB: 11.3 ng/mL — AB (ref 0.5–3.6)
CK-MB: 8.6 ng/mL — AB (ref 0.5–3.6)

## 2014-05-10 LAB — CBC WITH DIFFERENTIAL/PLATELET
Basophil #: 0 10*3/uL (ref 0.0–0.1)
Basophil %: 0.3 %
Eosinophil #: 0.1 10*3/uL (ref 0.0–0.7)
Eosinophil %: 0.7 %
HCT: 35.3 % (ref 35.0–47.0)
HGB: 11.1 g/dL — ABNORMAL LOW (ref 12.0–16.0)
LYMPHS ABS: 0.5 10*3/uL — AB (ref 1.0–3.6)
Lymphocyte %: 4.2 %
MCH: 27.5 pg (ref 26.0–34.0)
MCHC: 31.4 g/dL — ABNORMAL LOW (ref 32.0–36.0)
MCV: 88 fL (ref 80–100)
MONOS PCT: 4.8 %
Monocyte #: 0.6 x10 3/mm (ref 0.2–0.9)
Neutrophil #: 11.1 10*3/uL — ABNORMAL HIGH (ref 1.4–6.5)
Neutrophil %: 90 %
Platelet: 299 10*3/uL (ref 150–440)
RBC: 4.03 10*6/uL (ref 3.80–5.20)
RDW: 14.9 % — ABNORMAL HIGH (ref 11.5–14.5)
WBC: 12.3 10*3/uL — AB (ref 3.6–11.0)

## 2014-05-10 LAB — TROPONIN I: TROPONIN-I: 1.4 ng/mL — AB

## 2014-05-10 LAB — MAGNESIUM: Magnesium: 1.9 mg/dL

## 2014-05-10 LAB — BASIC METABOLIC PANEL
ANION GAP: 10 (ref 7–16)
BUN: 34 mg/dL — ABNORMAL HIGH (ref 7–18)
Calcium, Total: 8.5 mg/dL (ref 8.5–10.1)
Chloride: 104 mmol/L (ref 98–107)
Co2: 27 mmol/L (ref 21–32)
Creatinine: 4.14 mg/dL — ABNORMAL HIGH (ref 0.60–1.30)
GFR CALC AF AMER: 14 — AB
GFR CALC NON AF AMER: 12 — AB
GLUCOSE: 124 mg/dL — AB (ref 65–99)
OSMOLALITY: 290 (ref 275–301)
Potassium: 3.3 mmol/L — ABNORMAL LOW (ref 3.5–5.1)
SODIUM: 141 mmol/L (ref 136–145)

## 2014-05-10 LAB — HEMOGLOBIN A1C: Hemoglobin A1C: 6.7 % — ABNORMAL HIGH (ref 4.2–6.3)

## 2014-05-10 LAB — BODY FLUID CELL COUNT WITH DIFFERENTIAL
Basophil: 0 %
EOS PCT: 1 %
LYMPHS PCT: 0 %
Neutrophils: 98 %
Nucleated Cell Count: 6220 /mm3
OTHER CELLS BF: 0 %
Other Mononuclear Cells: 1 %

## 2014-05-10 LAB — CK-MB: CK-MB: 9 ng/mL — ABNORMAL HIGH (ref 0.5–3.6)

## 2014-05-11 LAB — RENAL FUNCTION PANEL
ALBUMIN: 1.9 g/dL — AB (ref 3.4–5.0)
ANION GAP: 7 (ref 7–16)
BUN: 35 mg/dL — AB (ref 7–18)
CREATININE: 4 mg/dL — AB (ref 0.60–1.30)
Calcium, Total: 8.2 mg/dL — ABNORMAL LOW (ref 8.5–10.1)
Chloride: 106 mmol/L (ref 98–107)
Co2: 28 mmol/L (ref 21–32)
EGFR (Non-African Amer.): 12 — ABNORMAL LOW
GFR CALC AF AMER: 15 — AB
Glucose: 113 mg/dL — ABNORMAL HIGH (ref 65–99)
OSMOLALITY: 290 (ref 275–301)
POTASSIUM: 3.9 mmol/L (ref 3.5–5.1)
Phosphorus: 3.8 mg/dL (ref 2.5–4.9)
SODIUM: 141 mmol/L (ref 136–145)

## 2014-05-11 LAB — CBC WITH DIFFERENTIAL/PLATELET
BASOS ABS: 0.1 10*3/uL (ref 0.0–0.1)
Basophil %: 0.9 %
Eosinophil #: 0.4 10*3/uL (ref 0.0–0.7)
Eosinophil %: 5.5 %
HCT: 30.6 % — AB (ref 35.0–47.0)
HGB: 9.7 g/dL — AB (ref 12.0–16.0)
Lymphocyte #: 1.3 10*3/uL (ref 1.0–3.6)
Lymphocyte %: 16.9 %
MCH: 27.6 pg (ref 26.0–34.0)
MCHC: 31.6 g/dL — AB (ref 32.0–36.0)
MCV: 88 fL (ref 80–100)
MONO ABS: 0.5 x10 3/mm (ref 0.2–0.9)
Monocyte %: 6.1 %
NEUTROS PCT: 70.6 %
Neutrophil #: 5.4 10*3/uL (ref 1.4–6.5)
PLATELETS: 283 10*3/uL (ref 150–440)
RBC: 3.5 10*6/uL — AB (ref 3.80–5.20)
RDW: 14.3 % (ref 11.5–14.5)
WBC: 7.6 10*3/uL (ref 3.6–11.0)

## 2014-05-11 LAB — VANCOMYCIN, TROUGH: Vancomycin, Trough: 12 ug/mL (ref 10–20)

## 2014-05-13 LAB — PHOSPHORUS: PHOSPHORUS: 4.4 mg/dL (ref 2.5–4.9)

## 2014-05-14 LAB — RENAL FUNCTION PANEL
Albumin: 1.9 g/dL — ABNORMAL LOW (ref 3.4–5.0)
Anion Gap: 5 — ABNORMAL LOW (ref 7–16)
BUN: 28 mg/dL — AB (ref 7–18)
CHLORIDE: 102 mmol/L (ref 98–107)
Calcium, Total: 8 mg/dL — ABNORMAL LOW (ref 8.5–10.1)
Co2: 31 mmol/L (ref 21–32)
Creatinine: 3.06 mg/dL — ABNORMAL HIGH (ref 0.60–1.30)
EGFR (Non-African Amer.): 17 — ABNORMAL LOW
GFR CALC AF AMER: 20 — AB
GLUCOSE: 316 mg/dL — AB (ref 65–99)
OSMOLALITY: 293 (ref 275–301)
PHOSPHORUS: 3 mg/dL (ref 2.5–4.9)
Potassium: 4.7 mmol/L (ref 3.5–5.1)
Sodium: 138 mmol/L (ref 136–145)

## 2014-05-14 LAB — PLATELET COUNT: PLATELETS: 275 10*3/uL (ref 150–440)

## 2014-05-14 LAB — CBC WITH DIFFERENTIAL/PLATELET
BASOS ABS: 0.1 10*3/uL (ref 0.0–0.1)
BASOS PCT: 0.8 %
Eosinophil #: 0.4 10*3/uL (ref 0.0–0.7)
Eosinophil %: 4.8 %
HCT: 21.8 % — ABNORMAL LOW (ref 35.0–47.0)
HGB: 7.1 g/dL — ABNORMAL LOW (ref 12.0–16.0)
LYMPHS ABS: 1.2 10*3/uL (ref 1.0–3.6)
Lymphocyte %: 13.5 %
MCH: 28.3 pg (ref 26.0–34.0)
MCHC: 32.5 g/dL (ref 32.0–36.0)
MCV: 87 fL (ref 80–100)
MONOS PCT: 4.7 %
Monocyte #: 0.4 x10 3/mm (ref 0.2–0.9)
Neutrophil #: 6.7 10*3/uL — ABNORMAL HIGH (ref 1.4–6.5)
Neutrophil %: 76.2 %
Platelet: 271 10*3/uL (ref 150–440)
RBC: 2.5 10*6/uL — ABNORMAL LOW (ref 3.80–5.20)
RDW: 14.8 % — ABNORMAL HIGH (ref 11.5–14.5)
WBC: 8.8 10*3/uL (ref 3.6–11.0)

## 2014-05-14 LAB — CULTURE, BLOOD (SINGLE)

## 2014-05-14 LAB — BODY FLUID CULTURE

## 2014-05-15 LAB — CBC WITH DIFFERENTIAL/PLATELET
BASOS ABS: 0.1 10*3/uL (ref 0.0–0.1)
Basophil %: 0.7 %
EOS PCT: 4.2 %
Eosinophil #: 0.4 10*3/uL (ref 0.0–0.7)
HCT: 22.8 % — ABNORMAL LOW (ref 35.0–47.0)
HGB: 7.6 g/dL — ABNORMAL LOW (ref 12.0–16.0)
LYMPHS ABS: 1.8 10*3/uL (ref 1.0–3.6)
Lymphocyte %: 18.1 %
MCH: 28.9 pg (ref 26.0–34.0)
MCHC: 33.2 g/dL (ref 32.0–36.0)
MCV: 87 fL (ref 80–100)
MONOS PCT: 7.3 %
Monocyte #: 0.7 x10 3/mm (ref 0.2–0.9)
NEUTROS ABS: 7 10*3/uL — AB (ref 1.4–6.5)
NEUTROS PCT: 69.7 %
Platelet: 228 10*3/uL (ref 150–440)
RBC: 2.63 10*6/uL — ABNORMAL LOW (ref 3.80–5.20)
RDW: 15 % — AB (ref 11.5–14.5)
WBC: 10.1 10*3/uL (ref 3.6–11.0)

## 2014-05-15 LAB — WOUND CULTURE

## 2014-05-16 LAB — CULTURE, BLOOD (SINGLE)

## 2014-05-17 ENCOUNTER — Inpatient Hospital Stay: Payer: Self-pay | Admitting: Internal Medicine

## 2014-05-17 DIAGNOSIS — I219 Acute myocardial infarction, unspecified: Secondary | ICD-10-CM

## 2014-05-17 HISTORY — DX: Acute myocardial infarction, unspecified: I21.9

## 2014-05-17 LAB — COMPREHENSIVE METABOLIC PANEL
ALBUMIN: 2.6 g/dL — AB (ref 3.4–5.0)
ALK PHOS: 135 U/L — AB
ALT: 35 U/L
ANION GAP: 11 (ref 7–16)
AST: 37 U/L (ref 15–37)
BILIRUBIN TOTAL: 0.6 mg/dL (ref 0.2–1.0)
BUN: 14 mg/dL (ref 7–18)
CHLORIDE: 99 mmol/L (ref 98–107)
CO2: 29 mmol/L (ref 21–32)
CREATININE: 2.7 mg/dL — AB (ref 0.60–1.30)
Calcium, Total: 8.2 mg/dL — ABNORMAL LOW (ref 8.5–10.1)
EGFR (African American): 23 — ABNORMAL LOW
GFR CALC NON AF AMER: 19 — AB
Glucose: 255 mg/dL — ABNORMAL HIGH (ref 65–99)
OSMOLALITY: 287 (ref 275–301)
Potassium: 3.8 mmol/L (ref 3.5–5.1)
Sodium: 139 mmol/L (ref 136–145)
Total Protein: 7.9 g/dL (ref 6.4–8.2)

## 2014-05-17 LAB — PROTIME-INR
INR: 1
PROTHROMBIN TIME: 13.5 s (ref 11.5–14.7)

## 2014-05-17 LAB — CK-MB
CK-MB: 15.7 ng/mL — AB (ref 0.5–3.6)
CK-MB: 16.8 ng/mL — ABNORMAL HIGH (ref 0.5–3.6)

## 2014-05-17 LAB — HEPARIN LEVEL (UNFRACTIONATED)

## 2014-05-17 LAB — APTT: ACTIVATED PTT: 36.3 s — AB (ref 23.6–35.9)

## 2014-05-17 LAB — CBC
HCT: 24.8 % — ABNORMAL LOW (ref 35.0–47.0)
HGB: 7.9 g/dL — AB (ref 12.0–16.0)
MCH: 28.6 pg (ref 26.0–34.0)
MCHC: 32 g/dL (ref 32.0–36.0)
MCV: 89 fL (ref 80–100)
Platelet: 284 10*3/uL (ref 150–440)
RBC: 2.78 10*6/uL — ABNORMAL LOW (ref 3.80–5.20)
RDW: 15.5 % — ABNORMAL HIGH (ref 11.5–14.5)
WBC: 15.6 10*3/uL — ABNORMAL HIGH (ref 3.6–11.0)

## 2014-05-17 LAB — WOUND CULTURE

## 2014-05-17 LAB — TROPONIN I
Troponin-I: 7.4 ng/mL — ABNORMAL HIGH
Troponin-I: 10 ng/mL — ABNORMAL HIGH

## 2014-05-17 LAB — CK TOTAL AND CKMB (NOT AT ARMC)
CK, TOTAL: 212 U/L — AB
CK-MB: 13.6 ng/mL — ABNORMAL HIGH (ref 0.5–3.6)

## 2014-05-17 LAB — PRO B NATRIURETIC PEPTIDE: B-Type Natriuretic Peptide: 14970 pg/mL — ABNORMAL HIGH (ref 0–125)

## 2014-05-18 LAB — PHOSPHORUS: Phosphorus: 5 mg/dL — ABNORMAL HIGH (ref 2.5–4.9)

## 2014-05-18 LAB — HEPARIN LEVEL (UNFRACTIONATED)
Anti-Xa(Unfractionated): 0.18 IU/mL — ABNORMAL LOW (ref 0.30–0.70)
Anti-Xa(Unfractionated): 0.31 IU/mL (ref 0.30–0.70)
Anti-Xa(Unfractionated): 0.22 IU/mL — ABNORMAL LOW (ref 0.30–0.70)

## 2014-05-18 LAB — VANCOMYCIN, TROUGH: VANCOMYCIN, TROUGH: 12 ug/mL (ref 10–20)

## 2014-05-19 LAB — CBC WITH DIFFERENTIAL/PLATELET
BASOS ABS: 0.1 10*3/uL (ref 0.0–0.1)
Basophil %: 0.8 %
Eosinophil #: 0.4 10*3/uL (ref 0.0–0.7)
Eosinophil %: 3 %
HCT: 21.6 % — AB (ref 35.0–47.0)
HGB: 6.8 g/dL — AB (ref 12.0–16.0)
Lymphocyte #: 1.8 10*3/uL (ref 1.0–3.6)
Lymphocyte %: 12.7 %
MCH: 28.2 pg (ref 26.0–34.0)
MCHC: 31.5 g/dL — ABNORMAL LOW (ref 32.0–36.0)
MCV: 90 fL (ref 80–100)
Monocyte #: 1.1 x10 3/mm — ABNORMAL HIGH (ref 0.2–0.9)
Monocyte %: 7.9 %
NEUTROS ABS: 10.9 10*3/uL — AB (ref 1.4–6.5)
NEUTROS PCT: 75.6 %
PLATELETS: 273 10*3/uL (ref 150–440)
RBC: 2.41 10*6/uL — ABNORMAL LOW (ref 3.80–5.20)
RDW: 15.8 % — ABNORMAL HIGH (ref 11.5–14.5)
WBC: 14.4 10*3/uL — AB (ref 3.6–11.0)

## 2014-05-19 LAB — HEPARIN LEVEL (UNFRACTIONATED): Anti-Xa(Unfractionated): 0.13 IU/mL — ABNORMAL LOW (ref 0.30–0.70)

## 2014-05-19 LAB — PHOSPHORUS: Phosphorus: 3.9 mg/dL (ref 2.5–4.9)

## 2014-05-20 ENCOUNTER — Inpatient Hospital Stay (HOSPITAL_COMMUNITY)
Admission: AD | Admit: 2014-05-20 | Discharge: 2014-05-22 | DRG: 246 | Disposition: A | Discharge status: Home / Self Care | Payer: Medicare Other | Source: Other Acute Inpatient Hospital | Attending: Cardiology | Admitting: Cardiology

## 2014-05-20 ENCOUNTER — Encounter (HOSPITAL_COMMUNITY): Payer: Self-pay | Admitting: General Practice

## 2014-05-20 ENCOUNTER — Encounter (HOSPITAL_COMMUNITY)
Admission: AD | Disposition: A | Discharge status: Home / Self Care | Payer: Medicare Other | Source: Other Acute Inpatient Hospital | Attending: Cardiology

## 2014-05-20 DIAGNOSIS — N2581 Secondary hyperparathyroidism of renal origin: Secondary | ICD-10-CM | POA: Diagnosis present

## 2014-05-20 DIAGNOSIS — D638 Anemia in other chronic diseases classified elsewhere: Secondary | ICD-10-CM | POA: Diagnosis present

## 2014-05-20 DIAGNOSIS — Z794 Long term (current) use of insulin: Secondary | ICD-10-CM

## 2014-05-20 DIAGNOSIS — I252 Old myocardial infarction: Secondary | ICD-10-CM | POA: Diagnosis not present

## 2014-05-20 DIAGNOSIS — I214 Non-ST elevation (NSTEMI) myocardial infarction: Principal | ICD-10-CM | POA: Diagnosis present

## 2014-05-20 DIAGNOSIS — I120 Hypertensive chronic kidney disease with stage 5 chronic kidney disease or end stage renal disease: Secondary | ICD-10-CM | POA: Diagnosis present

## 2014-05-20 DIAGNOSIS — I249 Acute ischemic heart disease, unspecified: Secondary | ICD-10-CM | POA: Diagnosis present

## 2014-05-20 DIAGNOSIS — B9562 Methicillin resistant Staphylococcus aureus infection as the cause of diseases classified elsewhere: Secondary | ICD-10-CM | POA: Diagnosis not present

## 2014-05-20 DIAGNOSIS — H548 Legal blindness, as defined in USA: Secondary | ICD-10-CM | POA: Diagnosis present

## 2014-05-20 DIAGNOSIS — I2582 Chronic total occlusion of coronary artery: Secondary | ICD-10-CM | POA: Diagnosis not present

## 2014-05-20 DIAGNOSIS — J449 Chronic obstructive pulmonary disease, unspecified: Secondary | ICD-10-CM | POA: Diagnosis present

## 2014-05-20 DIAGNOSIS — E780 Pure hypercholesterolemia: Secondary | ICD-10-CM | POA: Diagnosis not present

## 2014-05-20 DIAGNOSIS — E1140 Type 2 diabetes mellitus with diabetic neuropathy, unspecified: Secondary | ICD-10-CM | POA: Diagnosis not present

## 2014-05-20 DIAGNOSIS — Z992 Dependence on renal dialysis: Secondary | ICD-10-CM

## 2014-05-20 DIAGNOSIS — I480 Paroxysmal atrial fibrillation: Secondary | ICD-10-CM | POA: Diagnosis present

## 2014-05-20 DIAGNOSIS — I5023 Acute on chronic systolic (congestive) heart failure: Secondary | ICD-10-CM | POA: Diagnosis present

## 2014-05-20 DIAGNOSIS — K650 Generalized (acute) peritonitis: Secondary | ICD-10-CM | POA: Diagnosis present

## 2014-05-20 DIAGNOSIS — E11649 Type 2 diabetes mellitus with hypoglycemia without coma: Secondary | ICD-10-CM | POA: Diagnosis present

## 2014-05-20 DIAGNOSIS — I2510 Atherosclerotic heart disease of native coronary artery without angina pectoris: Secondary | ICD-10-CM | POA: Diagnosis not present

## 2014-05-20 DIAGNOSIS — N186 End stage renal disease: Secondary | ICD-10-CM | POA: Diagnosis present

## 2014-05-20 DIAGNOSIS — I5020 Unspecified systolic (congestive) heart failure: Secondary | ICD-10-CM | POA: Diagnosis present

## 2014-05-20 HISTORY — DX: Type 2 diabetes mellitus without complications: E11.9

## 2014-05-20 HISTORY — DX: Acute myocardial infarction, unspecified: I21.9

## 2014-05-20 HISTORY — DX: Chronic kidney disease, unspecified: N18.9

## 2014-05-20 HISTORY — DX: Atherosclerotic heart disease of native coronary artery without angina pectoris: I25.10

## 2014-05-20 HISTORY — DX: Chronic obstructive pulmonary disease, unspecified: J44.9

## 2014-05-20 HISTORY — DX: Essential (primary) hypertension: I10

## 2014-05-20 HISTORY — PX: LEFT HEART CATHETERIZATION WITH CORONARY ANGIOGRAM: SHX5451

## 2014-05-20 LAB — HEMOGLOBIN A1C
HEMOGLOBIN A1C: 6.2 % — AB (ref <5.7)
Hgb A1c MFr Bld: 6.3 % — ABNORMAL HIGH (ref <5.7)
MEAN PLASMA GLUCOSE: 134 mg/dL — AB (ref <117)
Mean Plasma Glucose: 131 mg/dL — ABNORMAL HIGH (ref <117)

## 2014-05-20 LAB — POCT I-STAT, CHEM 8
BUN: 14 mg/dL (ref 6–23)
Calcium, Ion: 1.17 mmol/L (ref 1.12–1.23)
Chloride: 100 mEq/L (ref 96–112)
Creatinine, Ser: 2.2 mg/dL — ABNORMAL HIGH (ref 0.50–1.10)
GLUCOSE: 87 mg/dL (ref 70–99)
HCT: 26 % — ABNORMAL LOW (ref 36.0–46.0)
HEMOGLOBIN: 8.8 g/dL — AB (ref 12.0–15.0)
POTASSIUM: 3.6 meq/L — AB (ref 3.7–5.3)
Sodium: 140 mEq/L (ref 137–147)
TCO2: 29 mmol/L (ref 0–100)

## 2014-05-20 LAB — COMPREHENSIVE METABOLIC PANEL
ALBUMIN: 2.2 g/dL — AB (ref 3.5–5.2)
ALK PHOS: 128 U/L — AB (ref 39–117)
ALT: 93 U/L — ABNORMAL HIGH (ref 0–35)
ALT: 89 U/L — ABNORMAL HIGH (ref 0–35)
ANION GAP: 16 — AB (ref 5–15)
ANION GAP: 13 (ref 5–15)
AST: 105 U/L — ABNORMAL HIGH (ref 0–37)
AST: 97 U/L — ABNORMAL HIGH (ref 0–37)
Albumin: 2.4 g/dL — ABNORMAL LOW (ref 3.5–5.2)
Alkaline Phosphatase: 130 U/L — ABNORMAL HIGH (ref 39–117)
BUN: 15 mg/dL (ref 6–23)
BUN: 17 mg/dL (ref 6–23)
CALCIUM: 9.1 mg/dL (ref 8.4–10.5)
CO2: 29 mEq/L (ref 19–32)
CO2: 31 mEq/L (ref 19–32)
Calcium: 9 mg/dL (ref 8.4–10.5)
Chloride: 97 mEq/L (ref 96–112)
Chloride: 99 mEq/L (ref 96–112)
Creatinine, Ser: 2.17 mg/dL — ABNORMAL HIGH (ref 0.50–1.10)
Creatinine, Ser: 2.42 mg/dL — ABNORMAL HIGH (ref 0.50–1.10)
GFR calc Af Amer: 27 mL/min — ABNORMAL LOW (ref >90)
GFR calc Af Amer: 24 mL/min — ABNORMAL LOW (ref >90)
GFR calc non Af Amer: 21 mL/min — ABNORMAL LOW (ref >90)
GFR, EST NON AFRICAN AMERICAN: 24 mL/min — AB (ref >90)
GLUCOSE: 83 mg/dL (ref 70–99)
GLUCOSE: 55 mg/dL — AB (ref 70–99)
POTASSIUM: 4.2 meq/L (ref 3.7–5.3)
Potassium: 3.6 mEq/L — ABNORMAL LOW (ref 3.7–5.3)
Sodium: 142 mEq/L (ref 137–147)
Sodium: 143 mEq/L (ref 137–147)
Total Bilirubin: 0.5 mg/dL (ref 0.3–1.2)
Total Bilirubin: 0.6 mg/dL (ref 0.3–1.2)
Total Protein: 7.4 g/dL (ref 6.0–8.3)
Total Protein: 7.3 g/dL (ref 6.0–8.3)

## 2014-05-20 LAB — CBC WITH DIFFERENTIAL/PLATELET
BASOS ABS: 0.1 10*3/uL (ref 0.0–0.1)
BASOS PCT: 0 % (ref 0–1)
Eosinophils Absolute: 0.4 10*3/uL (ref 0.0–0.7)
Eosinophils Relative: 3 % (ref 0–5)
HCT: 26.1 % — ABNORMAL LOW (ref 36.0–46.0)
Hemoglobin: 8.1 g/dL — ABNORMAL LOW (ref 12.0–15.0)
LYMPHS PCT: 9 % — AB (ref 12–46)
Lymphs Abs: 1.2 10*3/uL (ref 0.7–4.0)
MCH: 28.3 pg (ref 26.0–34.0)
MCHC: 31 g/dL (ref 30.0–36.0)
MCV: 91.3 fL (ref 78.0–100.0)
MONO ABS: 0.9 10*3/uL (ref 0.1–1.0)
Monocytes Relative: 7 % (ref 3–12)
NEUTROS ABS: 10.1 10*3/uL — AB (ref 1.7–7.7)
Neutrophils Relative %: 81 % — ABNORMAL HIGH (ref 43–77)
PLATELETS: 322 10*3/uL (ref 150–400)
RBC: 2.86 MIL/uL — ABNORMAL LOW (ref 3.87–5.11)
RDW: 16.3 % — AB (ref 11.5–15.5)
WBC: 12.5 10*3/uL — ABNORMAL HIGH (ref 4.0–10.5)

## 2014-05-20 LAB — TROPONIN I
Troponin I: 5.64 ng/mL (ref <0.30)
Troponin I: 6.81 ng/mL (ref <0.30)
Troponin I: 6.17 ng/mL (ref <0.30)

## 2014-05-20 LAB — BLOOD PRODUCT ORDER (VERBAL) VERIFICATION

## 2014-05-20 LAB — CK TOTAL AND CKMB (NOT AT ARMC)
CK, MB: 5 ng/mL — ABNORMAL HIGH (ref 0.3–4.0)
Relative Index: INVALID (ref 0.0–2.5)
Total CK: 70 U/L (ref 7–177)

## 2014-05-20 LAB — GLUCOSE, CAPILLARY
GLUCOSE-CAPILLARY: 35 mg/dL — AB (ref 70–99)
GLUCOSE-CAPILLARY: 168 mg/dL — AB (ref 70–99)
GLUCOSE-CAPILLARY: 33 mg/dL — AB (ref 70–99)
Glucose-Capillary: 150 mg/dL — ABNORMAL HIGH (ref 70–99)
Glucose-Capillary: 140 mg/dL — ABNORMAL HIGH (ref 70–99)

## 2014-05-20 LAB — ABO/RH: ABO/RH(D): O POS

## 2014-05-20 LAB — LIPID PANEL
CHOL/HDL RATIO: 2.8 ratio
CHOLESTEROL: 102 mg/dL (ref 0–200)
HDL: 37 mg/dL — ABNORMAL LOW (ref >39)
LDL Cholesterol: 31 mg/dL (ref 0–99)
Triglycerides: 169 mg/dL — ABNORMAL HIGH (ref <150)
VLDL: 34 mg/dL (ref 0–40)

## 2014-05-20 LAB — POCT ACTIVATED CLOTTING TIME: Activated Clotting Time: 349 seconds

## 2014-05-20 LAB — PROTIME-INR
INR: 1.38 (ref 0.00–1.49)
Prothrombin Time: 17.1 seconds — ABNORMAL HIGH (ref 11.6–15.2)

## 2014-05-20 LAB — CBC
HCT: 27.1 % — ABNORMAL LOW (ref 36.0–46.0)
Hemoglobin: 8.4 g/dL — ABNORMAL LOW (ref 12.0–15.0)
MCH: 28.2 pg (ref 26.0–34.0)
MCHC: 31 g/dL (ref 30.0–36.0)
MCV: 90.9 fL (ref 78.0–100.0)
PLATELETS: 303 10*3/uL (ref 150–400)
RBC: 2.98 MIL/uL — AB (ref 3.87–5.11)
RDW: 16.4 % — ABNORMAL HIGH (ref 11.5–15.5)
WBC: 11.5 10*3/uL — ABNORMAL HIGH (ref 4.0–10.5)

## 2014-05-20 LAB — APTT: APTT: 143 s — AB (ref 24–37)

## 2014-05-20 LAB — MRSA PCR SCREENING: MRSA by PCR: NEGATIVE

## 2014-05-20 SURGERY — LEFT HEART CATHETERIZATION WITH CORONARY ANGIOGRAM
Anesthesia: LOCAL

## 2014-05-20 MED ORDER — ASPIRIN EC 81 MG PO TBEC
81.0000 mg | DELAYED_RELEASE_TABLET | Freq: Every day | ORAL | Status: DC
  Administered 2014-05-21 – 2014-05-22 (×2): 81 mg via ORAL
  Filled 2014-05-20 (×2): qty 1

## 2014-05-20 MED ORDER — ASPIRIN 325 MG PO TABS
ORAL_TABLET | ORAL | Status: AC
  Filled 2014-05-20: qty 1

## 2014-05-20 MED ORDER — NITROGLYCERIN 1 MG/10 ML FOR IR/CATH LAB
INTRA_ARTERIAL | Status: AC
  Filled 2014-05-20: qty 10

## 2014-05-20 MED ORDER — AZITHROMYCIN 250 MG PO TABS
250.0000 mg | ORAL_TABLET | ORAL | Status: DC
  Administered 2014-05-20 – 2014-05-21 (×2): 250 mg via ORAL
  Filled 2014-05-20 (×3): qty 1

## 2014-05-20 MED ORDER — HEPARIN (PORCINE) IN NACL 2-0.9 UNIT/ML-% IJ SOLN
INTRAMUSCULAR | Status: AC
  Filled 2014-05-20: qty 1500

## 2014-05-20 MED ORDER — ONDANSETRON HCL 4 MG/2ML IJ SOLN: 4.0000 mg | Freq: Four times a day (QID) | INTRAMUSCULAR | Status: DC | PRN

## 2014-05-20 MED ORDER — ATROPINE SULFATE 0.1 MG/ML IJ SOLN
INTRAMUSCULAR | Status: AC
  Filled 2014-05-20: qty 10

## 2014-05-20 MED ORDER — METOPROLOL TARTRATE 1 MG/ML IV SOLN
INTRAVENOUS | Status: AC
  Filled 2014-05-20: qty 5

## 2014-05-20 MED ORDER — VANCOMYCIN HCL IN DEXTROSE 750-5 MG/150ML-% IV SOLN: 750.0000 mg | INTRAVENOUS | Status: DC

## 2014-05-20 MED ORDER — SODIUM CHLORIDE 0.9 % IJ SOLN: 3.0000 mL | INTRAMUSCULAR | Status: DC | PRN

## 2014-05-20 MED ORDER — ASPIRIN 300 MG RE SUPP
300.0000 mg | RECTAL | Status: DC
  Filled 2014-05-20: qty 1

## 2014-05-20 MED ORDER — HEPARIN (PORCINE) IN NACL 100-0.45 UNIT/ML-% IJ SOLN
INTRAMUSCULAR | Status: AC
  Filled 2014-05-20: qty 250

## 2014-05-20 MED ORDER — ATORVASTATIN CALCIUM 80 MG PO TABS
80.0000 mg | ORAL_TABLET | Freq: Every day | ORAL | Status: DC
  Administered 2014-05-20 – 2014-05-21 (×2): 80 mg via ORAL
  Filled 2014-05-20 (×3): qty 1

## 2014-05-20 MED ORDER — HEPARIN BOLUS VIA INFUSION
4000.0000 [IU] | Freq: Once | INTRAVENOUS | Status: AC
  Administered 2014-05-20: 4000 [IU] via INTRAVENOUS
  Filled 2014-05-20: qty 4000

## 2014-05-20 MED ORDER — PANTOPRAZOLE SODIUM 40 MG PO TBEC
40.0000 mg | DELAYED_RELEASE_TABLET | Freq: Every day | ORAL | Status: DC
  Administered 2014-05-21 – 2014-05-22 (×2): 40 mg via ORAL
  Filled 2014-05-20 (×2): qty 1

## 2014-05-20 MED ORDER — BIVALIRUDIN 250 MG IV SOLR
INTRAVENOUS | Status: AC
  Filled 2014-05-20: qty 250

## 2014-05-20 MED ORDER — AMIODARONE HCL IN DEXTROSE 360-4.14 MG/200ML-% IV SOLN
30.0000 mg/h | INTRAVENOUS | Status: DC
  Administered 2014-05-20 – 2014-05-21 (×2): 30 mg/h via INTRAVENOUS
  Filled 2014-05-20 (×5): qty 200

## 2014-05-20 MED ORDER — ASPIRIN 81 MG PO CHEW: 324.0000 mg | CHEWABLE_TABLET | ORAL | Status: DC

## 2014-05-20 MED ORDER — AMIODARONE HCL IN DEXTROSE 360-4.14 MG/200ML-% IV SOLN
60.0000 mg/h | INTRAVENOUS | Status: DC
  Filled 2014-05-20: qty 200

## 2014-05-20 MED ORDER — TICAGRELOR 90 MG PO TABS
ORAL_TABLET | ORAL | Status: AC
  Filled 2014-05-20: qty 2

## 2014-05-20 MED ORDER — NITROGLYCERIN IN D5W 200-5 MCG/ML-% IV SOLN
0.0000 ug/min | INTRAVENOUS | Status: DC
  Administered 2014-05-20: 10 ug/min via INTRAVENOUS
  Administered 2014-05-20: 5 ug/min via INTRAVENOUS

## 2014-05-20 MED ORDER — ASPIRIN 81 MG PO CHEW: 81.0000 mg | CHEWABLE_TABLET | Freq: Every day | ORAL | Status: DC

## 2014-05-20 MED ORDER — SODIUM CHLORIDE 0.9 % IJ SOLN
3.0000 mL | Freq: Two times a day (BID) | INTRAMUSCULAR | Status: DC
  Administered 2014-05-20: 3 mL via INTRAVENOUS

## 2014-05-20 MED ORDER — AMIODARONE HCL 150 MG/3ML IV SOLN
INTRAVENOUS | Status: AC
  Filled 2014-05-20: qty 3

## 2014-05-20 MED ORDER — SEVELAMER CARBONATE 800 MG PO TABS
800.0000 mg | ORAL_TABLET | Freq: Three times a day (TID) | ORAL | Status: DC
  Administered 2014-05-21 – 2014-05-22 (×5): 800 mg via ORAL
  Filled 2014-05-20 (×7): qty 1

## 2014-05-20 MED ORDER — NITROGLYCERIN IN D5W 200-5 MCG/ML-% IV SOLN: 5.0000 ug/min | INTRAVENOUS | Status: DC

## 2014-05-20 MED ORDER — NITROGLYCERIN 0.4 MG SL SUBL: 0.4000 mg | SUBLINGUAL_TABLET | SUBLINGUAL | Status: DC | PRN

## 2014-05-20 MED ORDER — ASPIRIN 325 MG PO TABS
325.0000 mg | ORAL_TABLET | Freq: Every day | ORAL | Status: DC
  Administered 2014-05-20: 325 mg via ORAL

## 2014-05-20 MED ORDER — LIDOCAINE HCL (PF) 1 % IJ SOLN
INTRAMUSCULAR | Status: AC
  Filled 2014-05-20: qty 30

## 2014-05-20 MED ORDER — SODIUM CHLORIDE 0.9 % IV SOLN
250.0000 mL | INTRAVENOUS | Status: DC | PRN
  Administered 2014-05-20: 250 mL via INTRAVENOUS

## 2014-05-20 MED ORDER — ACETAMINOPHEN 325 MG PO TABS: 650.0000 mg | ORAL_TABLET | ORAL | Status: DC | PRN

## 2014-05-20 MED ORDER — ASPIRIN 81 MG PO CHEW: 81.0000 mg | CHEWABLE_TABLET | ORAL | Status: DC

## 2014-05-20 MED ORDER — TICAGRELOR 90 MG PO TABS
90.0000 mg | ORAL_TABLET | Freq: Two times a day (BID) | ORAL | Status: DC
  Administered 2014-05-20 – 2014-05-22 (×4): 90 mg via ORAL
  Filled 2014-05-20 (×6): qty 1

## 2014-05-20 MED ORDER — DEXTROSE 50 % IV SOLN
INTRAVENOUS | Status: AC
  Administered 2014-05-20: 50 mL
  Filled 2014-05-20: qty 50

## 2014-05-20 MED ORDER — DOCUSATE SODIUM 100 MG PO CAPS
100.0000 mg | ORAL_CAPSULE | Freq: Two times a day (BID) | ORAL | Status: DC
  Administered 2014-05-20 – 2014-05-22 (×4): 100 mg via ORAL
  Filled 2014-05-20 (×5): qty 1

## 2014-05-20 MED ORDER — MIDAZOLAM HCL 2 MG/2ML IJ SOLN
INTRAMUSCULAR | Status: AC
  Filled 2014-05-20: qty 2

## 2014-05-20 MED ORDER — ONDANSETRON HCL 4 MG/2ML IJ SOLN
4.0000 mg | Freq: Four times a day (QID) | INTRAMUSCULAR | Status: DC | PRN
  Administered 2014-05-21 – 2014-05-22 (×2): 4 mg via INTRAVENOUS
  Filled 2014-05-20 (×2): qty 2

## 2014-05-20 MED ORDER — NITROGLYCERIN IN D5W 200-5 MCG/ML-% IV SOLN
INTRAVENOUS | Status: AC
  Administered 2014-05-20: 5 ug/min
  Filled 2014-05-20: qty 250

## 2014-05-20 MED ORDER — INSULIN ASPART 100 UNIT/ML ~~LOC~~ SOLN
0.0000 [IU] | Freq: Three times a day (TID) | SUBCUTANEOUS | Status: DC
  Administered 2014-05-21: 3 [IU] via SUBCUTANEOUS
  Administered 2014-05-21: 2 [IU] via SUBCUTANEOUS
  Administered 2014-05-22 (×2): 3 [IU] via SUBCUTANEOUS

## 2014-05-20 MED ORDER — CARVEDILOL 6.25 MG PO TABS
6.2500 mg | ORAL_TABLET | Freq: Two times a day (BID) | ORAL | Status: DC
  Administered 2014-05-20 – 2014-05-22 (×4): 6.25 mg via ORAL
  Filled 2014-05-20 (×6): qty 1

## 2014-05-20 MED ORDER — NITROGLYCERIN IN D5W 200-5 MCG/ML-% IV SOLN: 10.0000 ug/min | INTRAVENOUS | Status: DC

## 2014-05-20 MED ORDER — DARBEPOETIN ALFA 40 MCG/0.4ML IJ SOSY
40.0000 ug | PREFILLED_SYRINGE | INTRAMUSCULAR | Status: DC
  Administered 2014-05-21: 40 ug via INTRAVENOUS
  Filled 2014-05-20: qty 0.4

## 2014-05-20 MED ORDER — FENTANYL CITRATE 0.05 MG/ML IJ SOLN
INTRAMUSCULAR | Status: AC
  Filled 2014-05-20: qty 2

## 2014-05-20 NOTE — H&P (Signed)
[name] is an 58 y.o. female.   Chief Complaint: chest pain/elevated cardiac enzymes and new EKG changes [name] is 58 year old female with past medical history significant for multiple medical problems i.e. Coronary artery disease history of MI in 2002 and subsequently had MI in 2013, hypertension, diabetes mellitus, history of congestive heart failure, end-stage renal disease on peritoneal dialysis since 2013 until last week when she was switched to hemodialysis and as. On your dialysis catheter was not working. Significant abdominal wall hematoma status post removal of the plate on UL dialysis catheter, hypercholesteremia, and GERD, anemia of chronic disease, paroxysmal A. Fib with RVR last night, converted back to sinus rhythm, was admitted at [facility] on 1031 because of weak chest pain associated with shortness of breath patient ruled in for non-Q-wave myocardial infarction and subsequently had cardiac catheterization done at [facility] yesterday which showed subtotal occlusion of left circumflex critical mid LAD and distal diffuse LAD stenosis and proximal60-70% RCA stenosis with EF of 30-35%. Patient was transferred here for further management. Spoke with CVT S and patient was felt not the candidate for CABG in view of diffuse diseas and not good targets. EKG done now showed normal sinus rhythm with slightly more prominent ST elevation in inferolateral leads as compared to EKG done on November 1.  No past medical history on file.  No past surgical history on file.  No family history on file. Social History:  has no tobacco, alcohol, and drug history on file.  Allergies: Allergies not on file  No prescriptions prior to admission    No results found for this or any previous visit (from the past 48 hour(s)). No results found.  Review of Systems  Constitutional: Negative for fever and chills.  Eyes: Negative for double vision and photophobia.   Respiratory: Positive for shortness of breath. Negative for cough, hemoptysis and sputum production.   Cardiovascular: Positive for chest pain. Negative for palpitations, orthopnea, claudication and leg swelling.  Gastrointestinal: Negative for nausea and vomiting.  Genitourinary: Negative for dysuria.  Neurological: Negative for dizziness and headaches.    Blood pressure 163/74, pulse 95, resp. rate 27, SpO2 100 %. Physical Exam  Constitutional: She is oriented to person, place, and time.  HENT:  Head: Normocephalic and atraumatic.  Eyes: Conjunctivae are normal. Left eye exhibits no discharge. No scleral icterus.  Neck: Normal range of motion. Neck supple. JVD present. No tracheal deviation present. No thyromegaly present.  Cardiovascular: Normal rate and regular rhythm.   Murmur (soft systolic murmur and S3 gallop noted) heard. Respiratory:  Decreased breath sound at bases with bilateral lateral faint rales  GI:  Moderate-sized hematoma left abdominal wall with ecchymosis noted  Musculoskeletal: She exhibits no edema or tenderness.  Neurological: She is alert and oriented to person, place, and time.     Assessment/Plan Acute inferolateral wall injury Status post acutenon-Q-wave myocardial infarctionstatus post left cath at [facility] yesterday Multivessel coronary artery disease Decompensated mild systolic heart failure hypertension Diabetes mellitus End-stage renal disease on hemodialysis Hypercholesteremia Status post paroxysmal A. Fib Anemia of chronic disease Abdominal wall hematoma CAD history of MI 2 in the past Plan Discussed with patient regarding emergency left cath possible PTCA stenting its risk and benefits i.e. Death MI stroke local vascular complications worsening abdominal hematoma with anti-platelet and anticoagulants and consents for the procedure. Discussed briefly with Dr. Servando Snare over the phone, Dr. Servando Snare has seen the films and felt  she is not the surgical candidate  in view of diffuse disease and poor distal targets.  [name] 05/20/2014, 9:45 AM

## 2014-05-20 NOTE — Progress Notes (Signed)
Sheath to be removed. Distal pulses palpable, VS stable. Sheath removed and firm pressure applied for 20 minutes. Site level 0,  No bleeding noted, no signs of a hematoma formation. Pressure dressing applied to site.  Distal Pulses palpable after sheath removal.  Educated patient on care of site after sheath removal and to keep leg straight for 6 hours. Will monitor closely and assess for any changes. Roxan Hockey, RN

## 2014-05-20 NOTE — Cardiovascular Report (Signed)
[name] NO.:  [id number]  MEDICAL RECORD NO.:  [id number]  LOCATION:  2H08C                        FACILITY:  [facility]  PHYSICIAN:  Bali Lyn N. Terrence Dupont, M.D. DATE OF BIRTH:  January 16, 1956  DATE OF PROCEDURE:  05/20/2014 DATE OF DISCHARGE:                           CARDIAC CATHETERIZATION   PROCEDURE: 1. Left cardiac catheterization with selective left and right coronary     angiography via left groin using Judkins technique. 2. Attempted percutaneous coronary intervention to chronically     occluded left circumflex system. 3. Successful percutaneous transluminal coronary angioplasty to mid     left anterior descending using initially 2.0 x 12 mm long Emerge     balloon and then 2.5 x 15 mm long [place] Emerge and then 3.0 x 20 mm     long [place] Emerge balloon for predilatation. 4. Successful deployment of 3.0 x 38 mm long Xience Alpine drug-     eluting stent in mid left anterior descending. 5. Successful postdilatation of this stent using 3.0 x 20 mm long [place]     Emerge balloon. 6. Successful percutaneous transluminal coronary angioplasty to     proximal right coronary artery using 2.5 x 15 mm long [place] Emerge     balloon. 7. Successful deployment of 3.0 x 18 mm long Xience Alpine drug-     eluting stent in proximal right coronary artery. 8. Successful postdilatation of this stent using 3.0 x 15 mm long [place]     Emerge balloon.  INDICATION FOR THE PROCEDURE:  [name] is a 58 year old female with past medical history significant for multiple medical problems, i.e., coronary artery disease; history of MI x2 in 2002 and subsequently had in 2013, was treated medically, as per the patient did not had any stents; hypertension; diabetes mellitus; history of congestive heart failure secondary to depressed LV systolic function; end-stage renal disease, on peritoneal dialysis since 2013 until last week when she was switched to hemodialysis as PD catheter was not working.   The patient developed significant abdominal wall hematoma status post removal of the peritoneal dialysis catheter.  Hypercholesteremia, GERD, anemia of chronic disease, paroxysmally atrial fibrillation with RVR, last night converted back into sinus rhythm was admitted at [facility] on May 17, 2014, because of vague chest pain associated with shortness of breath.  The patient ruled in for non-Q-wave myocardial infarction and subsequently had cardiac catheterization done at [facility] yesterday which showed subtotal occlusion of left circumflex, critical mid LAD and distal diffuse LAD stenosis, proximal 60-70% ostial stenosis with EF of 30-35%.  The patient was transferred here for further management.  I spoke with Dr. Servando Snare over the phone.  The patient was felt not a candidate for CABG in view of diffuse disease and not good targets.  EKG done now  showed normal sinus rhythm, with slightly more prominent ST elevation in inferolateral leads as compared to EKG done at [facility] on May 18, 2014.  Due to vague chest pain, new minor EKG changes discussed with the patient regarding emergency left cath, possible PTCA and stenting, its risks and benefits, i.e., death, MI, stroke, local vascular complications, worsening abdominal  wall hematoma, bleeding, infection etc. and consented for PCI.  DESCRIPTION OF PROCEDURE:  After obtaining the informed consent, the patient was brought emergently to the cath lab and was placed on fluoroscopy table.  Left groin was prepped and draped in usual fashion. A 1% Xylocaine was used for local anesthesia in the left groin.  With the help of thin wall needle, a 6-French arterial sheath was placed. The sheath was aspirated and flushed.  Next, 6-French right Judkins catheter was advanced over the wire under fluoroscopic guidance up to the ascending aorta.  Wire was pulled out.  The catheter was  aspirated and connected to the Manifold.  Catheter was further advanced and engaged into right coronary ostium.  A single view of right coronary artery was obtained.  Next, the catheter was disengaged and was pulled out over the wire and was replaced with 6-French XB guiding catheter, which was advanced over the wire under fluoroscopic guidance up to the ascending aorta.  Wire was pulled out.  The catheter was aspirated and connected to the Manifold.  Catheter was further advanced and engaged into left coronary ostium.  Multiple views of the left system were taken.  FINDINGS:  Left main was patent.  LAD has 99% mid stenosis which is a calcified vessel with TIMI 2 distal flow.  Distally, the vessel appears to be diffusely diseased with TIMI 2 flow.  Diagonal 1 and 3 were very very small.  Left circumflex has 50-60% proximal stenosis and then was 100% occluded beyond the midportion filling by collaterals from LAD. OM1 and OM2 were very, very small.  RCA has 70-80% proximal and 20-25% mid stenosis with TIMI grade 3 distal flow.  INTERVENTIONAL PROCEDURE:  Attempted to do PCI to mid circumflex/obtuse marginal 3 without success.  This lesion appears to be chronically occluded which is filling by collaterals from LAD.  Also, attempted to do PTCA to AV groove branch which is very small.  Successful PTCA to mid LAD was done using initially 2.0 x 12 mm long Emerge balloon for predilatation and then 2.5 x 15 mm long [place] Emerge and then 3.0 x 20 mm long [place] Emerge balloon for predilatation and then 3.0 x 38 mm long Xience Alpine drug-eluting stent was deployed at 11 atmospheric pressure.  This stent was post dilated using 3.0 x 20 mm long [place] Emerge balloon going up to 21 atmospheric pressure.  Lesion dilated from 99% to 0% ostial in mid-LAD with improvement in flow in distal LAD with 30-40% sequential stenosis in distal LAD with TIMI grade 3 distal flow without evidence of dissection or distal  embolization.  Successful PTCA to proximal RCA was done using 2.5 x 15 mm long [place] Emerge balloon.  Two inflations were done and then 3.0 x 18 mm long Xience Alpine drug-eluting stent was deployed at 10 atmospheric pressure in proximal RCA.  The stent was post dilated using 3.0 x 15 mm long  Emerge balloon going up to 20 atmospheric pressure.  Lesion dilated from 70-80% to 0% residual with excellent TIMI grade 3 distal flow without evidence of dissection or distal embolization.  The patient received weight based Angiomax and 180 mg of Brilinta during the procedure.  The patient tolerated the procedure well.  There were no complications.  The patient was transferred to CCU in stable condition.     [name] Lai. Terrence Dupont, M.D.     MNH/MEDQ  D:  05/20/2014  T:  05/20/2014  Job:  [id number]

## 2014-05-20 NOTE — CV Procedure (Signed)
Left cardiac cath/PTCA stenting report dictated on 05/20/2014 dictation number is [id number]

## 2014-05-20 NOTE — Progress Notes (Signed)
ANTIBIOTIC CONSULT NOTE - INITIAL  Pharmacy Consult for vancomycin Indication: MRSA peritonitis  Allergies not on file  Patient Measurements: Height: 5' 4.5" (163.8 cm) Weight: 177 lb 11.1 oz (80.6 kg) IBW/kg (Calculated) : 55.85   Vital Signs: Temp: 97.4 F (36.3 C) (11/03 1523) Temp Source: Oral (11/03 1523) BP: 115/48 mmHg (11/03 1545) Pulse Rate: 81 (11/03 1545) Intake/Output from previous day:   Intake/Output from this shift:    Labs:  Recent Labs  05/20/14 1040 05/20/14 1342  WBC 11.5* 12.5*  HGB 8.4* 8.1*  PLT 303 322  CREATININE 2.17* 2.42*   Estimated Creatinine Clearance: 26 mL/min (by C-G formula based on Cr of 2.42). No results for input(s): VANCOTROUGH, VANCOPEAK, VANCORANDOM, GENTTROUGH, GENTPEAK, GENTRANDOM, TOBRATROUGH, TOBRAPEAK, TOBRARND, AMIKACINPEAK, AMIKACINTROU, AMIKACIN in the last 72 hours.   Microbiology: Recent Results (from the past 720 hour(s))  MRSA PCR Screening     Status: None   Collection Time: 05/20/14  9:12 AM  Result Value Ref Range Status   MRSA by PCR NEGATIVE NEGATIVE Final    Comment:        The GeneXpert MRSA Assay (FDA approved for NASAL specimens only), is one component of a comprehensive MRSA colonization surveillance program. It is not intended to diagnose MRSA infection nor to guide or monitor treatment for MRSA infections.    Assessment: 20 YOF who was transferred from [facility] for the possibility of CABG, however CVTS has deemed patient not a candidate.  Patient was previously on PD, but was transitioned to HD. She received 2 HD treatments at [facility] and was d/c'd home 10/29, had 1 outpt HD session and was then readmitted to [facility] 10/31. [facility]- planning to keep her on a MWF schedule. Patient was started on vancomycin on Tuesday 10/27 for peritonitis- plan was for 2 weeks of therapy. WBC 12.5, currently afebrile.  Goal of Therapy:  Pre-HD Vancomycin level 15-25  mcg/ml  Plan:  1. Pre-HD vancomycin level prior to tomorrow's HD session 2. Will re-order vancomycin based on this level. With current weight listed as 80.6kg, patient is right on the border of needing 750mg  or 1000mg  post-each HD per our vancomycin HD dosing protocol. 3. Will follow for clinical progression, HD schedule changes and LOT  Andera Cranmer D. Londen Lorge, PharmD, BCPS Clinical Pharmacist Pager: [phone] 05/20/2014 4:02 PM

## 2014-05-20 NOTE — Progress Notes (Signed)
[facility]  Patient Status: Active (receiving services up to time of hospitalization)  [facility] is providing the following services: RN and PT  If patient discharges after hours, please call [phone].   [name] 05/20/2014, 4:23 PM

## 2014-05-20 NOTE — Consult Note (Signed)
Indication for Consultation:  Management of ESRD/hemodialysis; anemia, hypertension/volume and secondary hyperparathyroidism  HPI: [name] is a 58 y.o. female transferred from [facility] for further management of chest pain. She was admitted to [facility] 10/31 with complaints of chest pain and sob, requiring 02. She was found to have pulmonary edema and elevated troponin. She has a history of HTN, DM, CAD, CHF and ESRD- followed by [facility] in [place], she was previously on PD (since 2013 assisted by her husband), changed to HD last week due to PD cath malfunction. Last Tuesday she was admitted to [facility] for MRSA peritonitis (started on Vancomycin q HD x 2 weeks) and her PD cath was clotted, it was removed 10/27, HD cath was placed at this time and she was transitioned to HD. She develop an abdominal hematoma after removal of PD cath. She had 2 HD treatments at [facility] then was discharged home 10/29, had one HD at [facility] in [place] on Friday. On Saturday she began having chest pain, described at non radiating pressure and sob. She was found to have an NSTEMI, was followed by cardiology and started on a heparin gtt. She went to the cath lab at [facility] 11/2, triple vessel disease. EF was 30-35%. She was transferred to [facility] for possible CABG but CVTS felt the pt is not a surgical candidate. She was taken back to the cath lab this AM for stent placement. Currently she is resting is bed, denies chest pain since cath and reports breathing is much better, now off mask and on [place]. She received HD yesterday at [place], will keep pt on a MWF shedule.   Past Medical History  Diagnosis Date  . Diabetes   . COPD (chronic obstructive pulmonary disease)   . CKD (chronic kidney disease)   . CAD (coronary artery disease)   . MI (myocardial infarction)   . HTN (hypertension)    No past surgical history on file. No family history on file. Social History:  reports that she has never smoked. She  does not have any smokeless tobacco history on file. Her alcohol and drug histories are not on file. Allergies not on file Prior to Admission medications   Not on File   Review of Systems: Gen: Denies any fever, chills, sweats, anorexia, fatigue, weakness, malaise, and sleep disorder. Poor appetite, possible weight loss. HEENT:Legally blind,. Normal external appearance No Epistaxis or Sore throat. No sinusitis.   CV: Reports chest pain, non radiating, resolved s/p cath.  Denies palpitations, syncope, orthopnea, PND, peripheral edema, and claudication. Resp:Reports dyspnea at rest, dyspnea with exercise but much improved. Reports dry cough. Denies sputum, wheezing, coughing up blood, and pleurisy. GI: Denies vomiting blood, jaundice, and fecal incontinence.   Denies dysphagia or odynophagia. Denies abdominal pain. No BM since last week. On colace at home GU : anuric MS: Denies joint pain, limitation of movement, and swelling, stiffness, low back pain, extremity pain. Denies muscle weakness, cramps, atrophy.  No use of non steroidal antiinflammatory drugs. Derm: Denies rash, itching, dry skin, hives, moles, warts, or unhealing ulcers.  Psych: Denies depression, anxiety, memory loss, suicidal ideation, hallucinations, paranoia, and confusion. Heme: Denies bruising, bleeding, and enlarged lymph nodes. Neuro: No headache.  No diplopia. No dysarthria.  No dysphasia.  No history of CVA.  No Seizures. No paresthesias.  No weakness. Endocrine DM- hypoglycemic episodes at [facility] and today   Current Facility-Administered Medications  Medication Dose Route Frequency Provider Last Rate Last Dose  . 0.9 %  sodium chloride infusion  250 mL Intravenous PRN Clent Demark, MD      . acetaminophen (TYLENOL) tablet 650 mg  650 mg Oral Q4H PRN Clent Demark, MD      . amiodarone (NEXTERONE PREMIX) 360 MG/200ML (1.8 mg/mL) IV infusion  30 mg/hr Intravenous Continuous Clent Demark, MD      . aspirin 325 MG  tablet           . [START ON 05/21/2014] aspirin EC tablet 81 mg  81 mg Oral Daily Clent Demark, MD      . aspirin tablet 325 mg  325 mg Oral Daily Clent Demark, MD   325 mg at 05/20/14 0955  . atorvastatin (LIPITOR) tablet 80 mg  80 mg Oral q1800 Clent Demark, MD      . carvedilol (COREG) tablet 6.25 mg  6.25 mg Oral BID WC Clent Demark, MD      . heparin 100-0.45 UNIT/ML-% infusion           . insulin aspart (novoLOG) injection 0-9 Units  0-9 Units Subcutaneous TID WC Clent Demark, MD      . nitroGLYCERIN (NITROSTAT) SL tablet 0.4 mg  0.4 mg Sublingual Q5 Min x 3 PRN Clent Demark, MD      . nitroGLYCERIN 0.2 mg/mL in dextrose 5 % infusion           . nitroGLYCERIN 50 mg in dextrose 5 % 250 mL (0.2 mg/mL) infusion  0-200 mcg/min Intravenous Titrated Clent Demark, MD 3 mL/hr at 05/20/14 0945 10 mcg/min at 05/20/14 0945  . ondansetron (ZOFRAN) injection 4 mg  4 mg Intravenous Q6H PRN Clent Demark, MD      . Derrill Memo ON 05/21/2014] pantoprazole (PROTONIX) EC tablet 40 mg  40 mg Oral Q0600 Clent Demark, MD      . sodium chloride 0.9 % injection 3 mL  3 mL Intravenous Q12H Clent Demark, MD      . sodium chloride 0.9 % injection 3 mL  3 mL Intravenous PRN Clent Demark, MD      . ticagrelor (BRILINTA) tablet 90 mg  90 mg Oral BID Clent Demark, MD       Physical Exam: Filed Vitals:   05/20/14 0915 05/20/14 1000 05/20/14 1017  BP: 163/74 143/81   Pulse: 95 101 134  Temp:  98.4 F (36.9 C)   TempSrc:  Oral   Resp: 27 21   Height:  5' 4.5" (1.638 m)   Weight:  80.6 kg (177 lb 11.1 oz)   SpO2: 100% 91%      General: Well developed, well nourished, in no acute distress. Diaphoretic at time of my exam, getting D50 (BS 30's). Pt legally blind Head: Normocephalic, atraumatic, sclera non-icteric, mucus membranes are moist. Poor dentation Neck: Supple. JVD not elevated. No carotid bruits Lungs: Clear, decreased bases. Breathing is unlabored. Heart: tachy 2/6 systolic  murmur Abdomen: Soft, non-tender,  Large area of bruising. PD cath site with dressing. Sheath in left groin  M-S:  Strength and tone appear normal for age. Lower extremities: trace LE edema Neuro: Alert and oriented X 3. Moves all extremities spontaneously. Psych:  Responds to questions appropriately with a normal affect. Dialysis Access:R IJ (placed 10/27)  Labs: Basic Metabolic Panel:  Recent Labs Lab 05/20/14 1040  NA 142  K 3.6*  CL 97  CO2 29  GLUCOSE 83  BUN 15  CREATININE 2.17*  CALCIUM  9.1   Liver Function Tests:  Recent Labs Lab 05/20/14 1040  AST 105*  ALT 93*  ALKPHOS 130*  BILITOT 0.5  PROT 7.4  ALBUMIN 2.4*    Recent Labs Lab 05/20/14 1040  WBC 11.5*  HGB 8.4*  HCT 27.1*  MCV 90.9  PLT 303    Recent Labs Lab 05/20/14 1040  CKTOTAL 70  CKMB 5.0*  TROPONINI 5.64*   Dialysis Orders:  HD initiated 10/27 at [facility]- had been on PD since 2013 Follows with [facility] in [place]  Assessment/Plan: 1.  NSTEMI - s/p cardiac cath with stent placement- report dictated, not available. Chest pain resolved now.  2. MRSA Peritonitis- Cont vanc q Hd- check vanc level pre HD tomorrow. Afebrile.  WBC 12.5 3. Possible URI- was started on zithromax 11/2 x5 day 4. Abdominal hematoma- s/p PD cath removal.  5. ESRD - MWF HD, last HD yesterday at [facility],  6. Hypertension/volume  - 143/81. Home meds of metop and losartan. pulm edema on xray at [facility]. Reports home weights while on PD around 180lbs (82kgs) but was having issues draining all the fluid. Wt here 80.6kgs, cont to lower edw at tolerated. 7.  Anemia  - hgb 8.1. S/p 2 u RBC transfused at [facility]. Watch CBC. Transfuse PRB. Start low dose ESA tomorrow. Needs Fe panel.  8.  Metabolic bone disease -  Corrected Ca 10.4. Sounds like she is on phoslo- will start nonCa+ binder pending phos 9.  Nutrition - clears now, alb 2.4. Appetite hasnt been great   Shelle Iron, NP [facility]  [phone] 05/20/2014, 2:13 PM  I have seen and examined this patient and agree with plan and assessment of BWhelan NP with highlighted additions. Pt with ESRD on PD from 2013->04/2014 (PD catheter removed d/t non-fx and MRSA peritonitis). Transitioned to HD last week [facility] and her outpt schedule is MWF [facility] in [place] (though has only had 1 outpt rx there) Readmitted to [facility] Saturday with CP, cath 3 v ds, transferred here in hopes of CABG, not felt to be surgical candidate. Went to cath lab today - per RN (not in notes) had stent to RCA, LAD (both DES) and PTA only of circ.  She is currently stable though experiencing hypoglycemia (30's, s/p D50) BP stable and no CP.  Will plan usual HD in the AM.  Start ESA's.(Darbe 40/week) Continue vanco (per pharmacy) for recent MRSA peritonitis (not sure how long current duration of Rx is). Check iron studies with HD in AM, Start binder.  Bodey Frizell B,MD 05/20/2014 4:51 PM

## 2014-05-20 NOTE — Plan of Care (Signed)
Problem: Consults Goal: Cardiac Cath Patient Education (See Patient Education module for education specifics.) Outcome: Progressing Goal: Tobacco Cessation referral if indicated Outcome: Progressing

## 2014-05-20 NOTE — Progress Notes (Signed)
EKG CRITICAL VALUE     12 lead EKG performed.  Critical value noted. Dr.Harwani, notified.   Asencion Gowda, CCT 05/20/2014 9:19 AM

## 2014-05-21 DIAGNOSIS — I25110 Atherosclerotic heart disease of native coronary artery with unstable angina pectoris: Secondary | ICD-10-CM

## 2014-05-21 LAB — CBC
HEMATOCRIT: 23.2 % — AB (ref 36.0–46.0)
HEMOGLOBIN: 7.1 g/dL — AB (ref 12.0–15.0)
MCH: 28.3 pg (ref 26.0–34.0)
MCHC: 30.6 g/dL (ref 30.0–36.0)
MCV: 92.4 fL (ref 78.0–100.0)
Platelets: 300 10*3/uL (ref 150–400)
RBC: 2.51 MIL/uL — ABNORMAL LOW (ref 3.87–5.11)
RDW: 16.2 % — AB (ref 11.5–15.5)
WBC: 10.2 10*3/uL (ref 4.0–10.5)

## 2014-05-21 LAB — RENAL FUNCTION PANEL
ANION GAP: 14 (ref 5–15)
Albumin: 2.1 g/dL — ABNORMAL LOW (ref 3.5–5.2)
Albumin: 2.3 g/dL — ABNORMAL LOW (ref 3.5–5.2)
Anion gap: 14 (ref 5–15)
BUN: 25 mg/dL — ABNORMAL HIGH (ref 6–23)
BUN: 32 mg/dL — AB (ref 6–23)
CALCIUM: 8.7 mg/dL (ref 8.4–10.5)
CHLORIDE: 90 meq/L — AB (ref 96–112)
CO2: 28 meq/L (ref 19–32)
CO2: 28 mEq/L (ref 19–32)
Calcium: 8.6 mg/dL (ref 8.4–10.5)
Chloride: 96 mEq/L (ref 96–112)
Creatinine, Ser: 3.15 mg/dL — ABNORMAL HIGH (ref 0.50–1.10)
Creatinine, Ser: 3.62 mg/dL — ABNORMAL HIGH (ref 0.50–1.10)
GFR calc Af Amer: 15 mL/min — ABNORMAL LOW (ref >90)
GFR calc non Af Amer: 13 mL/min — ABNORMAL LOW (ref >90)
GFR, EST AFRICAN AMERICAN: 17 mL/min — AB (ref >90)
GFR, EST NON AFRICAN AMERICAN: 15 mL/min — AB (ref >90)
GLUCOSE: 178 mg/dL — AB (ref 70–99)
Glucose, Bld: 237 mg/dL — ABNORMAL HIGH (ref 70–99)
POTASSIUM: 4.1 meq/L (ref 3.7–5.3)
Phosphorus: 4.1 mg/dL (ref 2.3–4.6)
Phosphorus: 3.6 mg/dL (ref 2.3–4.6)
Potassium: 4.4 mEq/L (ref 3.7–5.3)
SODIUM: 138 meq/L (ref 137–147)
Sodium: 132 mEq/L — ABNORMAL LOW (ref 137–147)

## 2014-05-21 LAB — GLUCOSE, CAPILLARY
GLUCOSE-CAPILLARY: 172 mg/dL — AB (ref 70–99)
GLUCOSE-CAPILLARY: 168 mg/dL — AB (ref 70–99)
GLUCOSE-CAPILLARY: 107 mg/dL — AB (ref 70–99)
GLUCOSE-CAPILLARY: 214 mg/dL — AB (ref 70–99)
Glucose-Capillary: 174 mg/dL — ABNORMAL HIGH (ref 70–99)
Glucose-Capillary: 278 mg/dL — ABNORMAL HIGH (ref 70–99)
Glucose-Capillary: 145 mg/dL — ABNORMAL HIGH (ref 70–99)

## 2014-05-21 LAB — VANCOMYCIN, RANDOM: VANCOMYCIN RM: 21.1 ug/mL

## 2014-05-21 LAB — LIPID PANEL
CHOL/HDL RATIO: 3.1 ratio
Cholesterol: 91 mg/dL (ref 0–200)
HDL: 29 mg/dL — ABNORMAL LOW (ref >39)
LDL Cholesterol: 26 mg/dL (ref 0–99)
Triglycerides: 181 mg/dL — ABNORMAL HIGH (ref <150)
VLDL: 36 mg/dL (ref 0–40)

## 2014-05-21 LAB — TROPONIN I: TROPONIN I: 5.66 ng/mL — AB (ref <0.30)

## 2014-05-21 LAB — PREPARE RBC (CROSSMATCH)

## 2014-05-21 LAB — VANCOMYCIN, TROUGH: VANCOMYCIN TR: 20.2 ug/mL — AB (ref 10.0–20.0)

## 2014-05-21 LAB — FERRITIN: FERRITIN: 3816 ng/mL — AB (ref 10–291)

## 2014-05-21 MED ORDER — HEPARIN SODIUM (PORCINE) 1000 UNIT/ML DIALYSIS: 1000.0000 [IU] | INTRAMUSCULAR | Status: DC | PRN

## 2014-05-21 MED ORDER — PENTAFLUOROPROP-TETRAFLUOROETH EX AERO: 1.0000 "application " | INHALATION_SPRAY | CUTANEOUS | Status: DC | PRN

## 2014-05-21 MED ORDER — LIDOCAINE HCL (PF) 1 % IJ SOLN: 5.0000 mL | INTRAMUSCULAR | Status: DC | PRN

## 2014-05-21 MED ORDER — LIDOCAINE-PRILOCAINE 2.5-2.5 % EX CREA
1.0000 | TOPICAL_CREAM | CUTANEOUS | Status: DC | PRN
Start: 2014-05-21 — End: 2014-05-22
  Filled 2014-05-21: qty 5

## 2014-05-21 MED ORDER — LOSARTAN POTASSIUM 50 MG PO TABS
50.0000 mg | ORAL_TABLET | Freq: Every day | ORAL | Status: DC
  Administered 2014-05-21 – 2014-05-22 (×2): 50 mg via ORAL
  Filled 2014-05-21 (×2): qty 1

## 2014-05-21 MED ORDER — DARBEPOETIN ALFA 40 MCG/0.4ML IJ SOSY
PREFILLED_SYRINGE | INTRAMUSCULAR | Status: AC
  Administered 2014-05-21: 40 ug via INTRAVENOUS
  Filled 2014-05-21: qty 0.4

## 2014-05-21 MED ORDER — VANCOMYCIN HCL IN DEXTROSE 750-5 MG/150ML-% IV SOLN
750.0000 mg | INTRAVENOUS | Status: DC
  Administered 2014-05-21: 750 mg via INTRAVENOUS
  Filled 2014-05-21: qty 150

## 2014-05-21 MED ORDER — SODIUM CHLORIDE 0.9 % IV SOLN: 100.0000 mL | INTRAVENOUS | Status: DC | PRN

## 2014-05-21 MED ORDER — NEPRO/CARBSTEADY PO LIQD
237.0000 mL | ORAL | Status: DC | PRN
  Filled 2014-05-21: qty 237

## 2014-05-21 MED ORDER — AMIODARONE HCL 200 MG PO TABS
200.0000 mg | ORAL_TABLET | Freq: Two times a day (BID) | ORAL | Status: DC
  Administered 2014-05-21 – 2014-05-22 (×3): 200 mg via ORAL
  Filled 2014-05-21 (×4): qty 1

## 2014-05-21 MED ORDER — ALTEPLASE 2 MG IJ SOLR
2.0000 mg | Freq: Once | INTRAMUSCULAR | Status: AC | PRN
  Filled 2014-05-21: qty 2

## 2014-05-21 MED ORDER — ACETAMINOPHEN 325 MG PO TABS
650.0000 mg | ORAL_TABLET | Freq: Once | ORAL | Status: AC
Start: 2014-05-21 — End: 2014-05-21
  Administered 2014-05-21: 650 mg via ORAL
  Filled 2014-05-21: qty 2

## 2014-05-21 MED ORDER — SODIUM CHLORIDE 0.9 % IV SOLN
Freq: Once | INTRAVENOUS | Status: AC
  Administered 2014-05-21: 10 mL via INTRAVENOUS

## 2014-05-21 MED FILL — Sodium Chloride IV Soln 0.9%: INTRAVENOUS | Qty: 50 | Status: AC
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
## 2014-05-21 NOTE — Progress Notes (Signed)
ANTIBIOTIC CONSULT NOTE  Pharmacy Consult for vancomycin Indication: MRSA peritonitis  Allergies  Allergen Reactions  . Other     pilogel burns like fire.  . Doxycycline Rash    Patient Measurements: Height: 5' 4.5" (163.8 cm) Weight: 179 lb 0.2 oz (81.2 kg) (Bed) IBW/kg (Calculated) : 55.85   Vital Signs: Temp: 98.2 F (36.8 C) (11/04 1319) Temp Source: Oral (11/04 1319) BP: 133/64 mmHg (11/04 1530) Pulse Rate: 71 (11/04 1530) Intake/Output from previous day: 11/03 0701 - 11/04 0700 In: 1200.8 [P.O.:840; I.V.:360.8] Out: 400 [Urine:400] Intake/Output from this shift: Total I/O In: 650.1 [P.O.:600; I.V.:50.1] Out: -   Labs:  Recent Labs  05/20/14 1040 05/20/14 1342 05/21/14 0410 05/21/14 1350  WBC 11.5* 12.5*  --  10.2  HGB 8.4* 8.1*  --  7.1*  PLT 303 322  --  300  CREATININE 2.17* 2.42* 3.15* 3.62*   Estimated Creatinine Clearance: 17.4 mL/min (by C-G formula based on Cr of 3.62).  Recent Labs  05/21/14 1200 05/21/14 1350  VANCOTROUGH  --  20.2*  VANCORANDOM 21.1  --      Microbiology: Recent Results (from the past 720 hour(s))  MRSA PCR Screening     Status: None   Collection Time: 05/20/14  9:12 AM  Result Value Ref Range Status   MRSA by PCR NEGATIVE NEGATIVE Final    Comment:        The GeneXpert MRSA Assay (FDA approved for NASAL specimens only), is one component of a comprehensive MRSA colonization surveillance program. It is not intended to diagnose MRSA infection nor to guide or monitor treatment for MRSA infections.    Assessment: 28 YOF who was transferred from [facility] for the possibility of CABG, however CVTS has deemed patient not a candidate.   Patient was previously on PD, but was transitioned to HD. She received 2 HD treatments at [facility] and was d/c'd home 10/29, had 1 outpt HD session and was then readmitted to [facility] 10/31. Last HD session was 11/2 at [facility]- keeping on a MWF schedule here. She is currently  receiving dialysis.  Patient was started on vancomycin on Tuesday 10/27 for peritonitis- plan was for 2 weeks of therapy. A vancomycin level prior to the start of today's dialysis session was in therapeutic range at 20.26mcg/mL (there was also an extra level drawn about 2 hours before session started that was also in range at 21.18mcg/mL) She was receiving vancomycin 750mg  IV qHD at [facility].  WBC 10.2, currently afebrile.  Goal of Therapy:   Pre-HD Vancomycin level 15-25 mcg/ml  Plan:  1. Vancomycin 750mg  IV qHD-MWF 2. Will follow for clinical progression, HD schedule changes and LOT  Azhar Yogi D. Sowmya Partridge, PharmD, BCPS Clinical Pharmacist Pager: [phone] 05/21/2014 4:03 PM

## 2014-05-21 NOTE — Progress Notes (Signed)
[facility] Rounding Note Subjective: Just got back in bed "felt good to be up"  Main discomfort is left abd wall (hematoma) Not having any chest pain  Vital signs in last 24 hours: Filed Vitals:   05/21/14 0600 05/21/14 0700 05/21/14 0730 05/21/14 0800  BP: 134/53 146/58 146/58 141/61  Pulse: 78 77 81 78  Temp:  98.1 F (36.7 C)    TempSrc:      Resp: 17 12 18 17   Height:      Weight:      SpO2: 96% 100% 100% 100%   Weight change:   Intake/Output Summary (Last 24 hours) at 05/21/14 0955 Last data filed at 05/21/14 0800  Gross per 24 hour  Intake 1337.45 ml  Output    400 ml  Net 937.45 ml   Physical Exam:  Blood pressure 141/61, pulse 78, temperature 98.1 F (36.7 C), temperature source Oral, resp. rate 17, height 5' 4.5" (1.638 m), weight 80.3 kg (177 lb 0.5 oz), SpO2 100 %. Very nice pale WF Talkative r IJ TDC No JVD Lungs clear S1S2 No S3 1/2/6 murmur USB Abd with huge ecchymosis, dressing over prev PD cath site (PD cath recently remobed) Tender over the left abd (largest area of hematoma) No sig LE edema  Labs: Basic Metabolic Panel:  Recent Labs Lab 05/20/14 1034 05/20/14 1040 05/20/14 1342 05/21/14 0410  NA 140 142 143 138  K 3.6* 3.6* 4.2 4.4  CL 100 97 99 96  CO2  --  29 31 28   GLUCOSE 87 83 55* 178*  BUN 14 15 17  25*  CREATININE 2.20* 2.17* 2.42* 3.15*  CALCIUM  --  9.1 9.0 8.7  PHOS  --   --   --  4.1   Liver Function Tests:  Recent Labs Lab 05/20/14 1040 05/20/14 1342 05/21/14 0410  AST 105* 97*  --   ALT 93* 89*  --   ALKPHOS 130* 128*  --   BILITOT 0.5 0.6  --   PROT 7.4 7.3  --   ALBUMIN 2.4* 2.2* 2.1*   No results for input(s): LIPASE, AMYLASE in the last 168 hours. No results for input(s): AMMONIA in the last 168 hours. CBC:  Recent Labs Lab 05/20/14 1034 05/20/14 1040 05/20/14 1342  WBC  --  11.5* 12.5*  NEUTROABS  --   --  10.1*  HGB 8.8* 8.4* 8.1*  HCT 26.0* 27.1* 26.1*  MCV  --  90.9 91.3  PLT  --   303 322    Recent Labs Lab 05/20/14 1040 05/20/14 1342 05/20/14 1915 05/21/14 0410  CKTOTAL 70  --   --   --   CKMB 5.0*  --   --   --   TROPONINI 5.64* 6.81* 6.17* 5.66*   CBG:  Recent Labs Lab 05/20/14 1722 05/20/14 1800 05/20/14 2151 05/21/14 0154 05/21/14 0732  GLUCAP 168* 140* 174* 172* 168*    Iron Studies: No results for input(s): IRON, TIBC, TRANSFERRIN, FERRITIN in the last 168 hours. Studies/Results: No results found. Medications:   . amiodarone  200 mg Oral BID  . aspirin EC  81 mg Oral Daily  . atorvastatin  80 mg Oral q1800  . azithromycin  250 mg Oral Q24H  . carvedilol  6.25 mg Oral BID WC  . darbepoetin (ARANESP) injection - DIALYSIS  40 mcg Intravenous Q Wed-HD  . docusate sodium  100 mg Oral BID  . insulin aspart  0-9 Units Subcutaneous TID WC  . losartan  50 mg Oral Daily  . pantoprazole  40 mg Oral Q0600  . sevelamer carbonate  800 mg Oral TID WC  . ticagrelor  90 mg Oral BID    Assessment/Plan: 1. NSTEMI - s/p cardiac cath with DES's to LAD, RCA, attempted PCI of circ.(chronically occluded) Per cards. Possible d/c tomorrow. 2. MRSA Peritonitis- Cont vanc q Hd- check vanc level pre HD. Afebrile. WBC 12.5 3. Possible URI- was started on zithromax 11/2 x5 day 4. Abdominal hematoma- s/p PD cath removal.  5. ESRD - MWF HD - HD today 6. Hypertension/volume - Home meds of metop and losartan. Currently on carvedilol and losartan. BP good. Volume control with HD.  Unsure what EDW is and pt not sure but says around 180-182 at home... 7. Anemia - hgb 8.1. S/p 2 u RBC transfused at [facility]. Watch CBC. Transfuse 1 unit of blood on HD today. Starting darbe today as well.  8. Metabolic bone disease - Corrected Ca 10.4. Sounds like she is on phoslo-startedonCa+ binder (sevelamer) Phos looks fine 9. Nutrition - clears now, alb 2.4. Appetite hasnt been great   Jamal Maes, MD [name] [phone] pager 05/21/2014, 9:55 AM

## 2014-05-21 NOTE — Procedures (Signed)
HD in pt's room Access TDC 400 140/56 PRBC's infusing (1 unit) for Hb 7.1 Starting Aranesp 40 mcg/week  Jamal Maes, MD [facility] [phone] Pager 05/21/2014, 4:27 PM

## 2014-05-21 NOTE — Progress Notes (Signed)
Subjective:  Doing well and denies any chest pain or shortness of breath up in chair.  No further episodes of atrial fibrillation with rapid ventricular response.  States voided urine this morning and feeling better.  Patient tolerated PCI to LAD RCA and attempted PCI to chronically occluded left circumflex system.cardiac enzymes trending down  Objective:  Vital Signs in the last 24 hours: Temp:  [97.4 F (36.3 C)-98.4 F (36.9 C)] 98.1 F (36.7 C) (11/04 0700) Pulse Rate:  [75-134] 78 (11/04 0800) Resp:  [10-27] 17 (11/04 0800) BP: (92-163)/(42-81) 141/61 mmHg (11/04 0800) SpO2:  [91 %-100 %] 100 % (11/04 0800) Weight:  [80.3 kg (177 lb 0.5 oz)-80.6 kg (177 lb 11.1 oz)] 80.3 kg (177 lb 0.5 oz) (11/04 0400)  Intake/Output from previous day: 11/03 0701 - 11/04 0700 In: 1200.8 [P.O.:840; I.V.:360.8] Out: 400 [Urine:400] Intake/Output from this shift: Total I/O In: 136.7 [P.O.:120; I.V.:16.7] Out: -   Physical Exam: Neck: no adenopathy, no carotid bruit, no JVD and supple, symmetrical, trachea midline Lungs: decreased breath sounds at bases air entry has improved Heart: regular rate and rhythm, S1, S2 normal and soft systolic murmur noted Abdomen: soft, non-tender; bowel sounds normal; no masses,  no organomegaly and resolving hematoma of left lower abdominal wall with ecchymosis Extremities: extremities normal, atraumatic, no cyanosis or edema and both groins stable with no evidence of hematoma   Lab Results:  Recent Labs  05/20/14 1040 05/20/14 1342  WBC 11.5* 12.5*  HGB 8.4* 8.1*  PLT 303 322    Recent Labs  05/20/14 1342 05/21/14 0410  NA 143 138  K 4.2 4.4  CL 99 96  CO2 31 28  GLUCOSE 55* 178*  BUN 17 25*  CREATININE 2.42* 3.15*    Recent Labs  05/20/14 1915 05/21/14 0410  TROPONINI 6.17* 5.66*   Hepatic Function Panel  Recent Labs  05/20/14 1342 05/21/14 0410  PROT 7.3  --   ALBUMIN 2.2* 2.1*  AST 97*  --   ALT 89*  --   ALKPHOS 128*  --    BILITOT 0.6  --     Recent Labs  05/21/14 0410  CHOL 91   No results for input(s): PROTIME in the last 72 hours.  Imaging: Imaging results have been reviewed and No results found.  Cardiac Studies:  Assessment/Plan:  Status post acute non-Q-wave myocardial infarctionstatus post left cath [facility] yesterday Multivessel coronary artery diseasestatus post PTCA stenting to mid LAD and proximal RCA with excellent results Status post  brief attempted PCI to CTO of left circumflex Decompensated mild systolic heart failure improved hypertension Diabetes mellitus End-stage renal disease on hemodialysis Hypercholesteremia Status post paroxysmal A. Fib Anemia of chronic disease Abdominal wall hematoma CAD history of MI 2 in the past Resolving MRSA peritonitis Plan Continue present management Scheduled for hemodialysis today check labs in a.m.  Possible discharge tomorrow if stable DC nitroglycerin DC IV amiodarone and switch to by mouth as per orders  LOS: 1 day    [name] N 05/21/2014, 8:56 AM

## 2014-05-21 NOTE — Consult Note (Signed)
[address]       [place] [address]             [phone]        Nikea S Oliff Hulbert Medical Record #[id number] Date of Birth: 10/17/1955  Referring: Dr Humphrey Rolls, [facility] Primary Care: No primary care provider on file.  Chief Complaint:  Acute MI 4 days ago with renal failure and 3 v cad   History of Present Illness:   Patient admitted 4 days ago to [place] with acute onset of chest pain and positive cardiac enzymes. She has history of Coronary artery disease history of MI in 2002 and subsequently had MI in 2013 She has history of  hypertension, diabetes mellitus, history of congestive heart failure, end-stage renal disease on peritoneal dialysis since 2013.  Started  hemodialysis last week. Her peritoneal  dialysis catheter was not working. Significant abdominal wall hematoma status post removal of the plate on UL dialysis catheter, hypercholesteremia, and GERD, anemia of chronic disease, paroxysmal A. Fib with RVRconverted back to sinus rhythm Patient was admitted at [name] on 1031 because of weak chest pain associated with shortness of breath patient ruled in for non-Q-wave myocardial infarction.  Cardiac catheterization done at [facility]  which showed subtotal occlusion of left circumflex critical mid LAD and distal diffuse LAD stenosis and proximal60-70% RCA stenosis with EF of 30-35% severe anterior apical lv dysfunction.  Patient was transferred to [facility]  for further management.  Films reviewed before transfer her with severe distal disease  thought to be poor candidate for CABG. Discussed with Dr Terrence Dupont, and plan for repeat angio and stent discussed and proposed to patient   Current Activity/ Functional Status: Patient was independent with mobility/ambulation, transfers, ADL's, IADL's.   Zubrod Score: At the time of surgery this patient's most appropriate activity status/level should be described as: []     0     Normal activity, no symptoms []     1    Restricted in physical strenuous activity but ambulatory, able to do out light work [x]     2    Ambulatory and capable of self care, unable to do work activities, up and about                 more than 50%  Of the time                            []     3    Only limited self care, in bed greater than 50% of waking hours []     4    Completely disabled, no self care, confined to bed or chair []     5    Moribund  Past Medical History  Diagnosis Date  . Diabetes   . COPD (chronic obstructive pulmonary disease)   . CKD (chronic kidney disease)   . CAD (coronary artery disease)   . MI (myocardial infarction)   . HTN (hypertension)     History reviewed. No pertinent past surgical history.  History  Smoking status  . Never Smoker   Smokeless tobacco  . Not on file    History  Alcohol Use: Not on file    History   Social History  . Marital Status: Married    Spouse Name: N/A    Number of Children: N/A  . Years of Education: N/A   Occupational History  .  Not on file.   Social History Main Topics  . Smoking status: Never Smoker   . Smokeless tobacco: Not on file  . Alcohol Use: Not on file  . Drug Use: Not on file  . Sexual Activity: Not on file   Other Topics Concern  . Not on file   Social History Narrative  . No narrative on file    Allergies  Allergen Reactions  . Other     pilogel burns like fire.  . Doxycycline Rash    Current Facility-Administered Medications  Medication Dose Route Frequency Provider Last Rate Last Dose  . acetaminophen (TYLENOL) tablet 650 mg  650 mg Oral Q4H PRN Clent Demark, MD      . amiodarone (PACERONE) tablet 200 mg  200 mg Oral BID Clent Demark, MD   200 mg at 05/21/14 1023  . aspirin EC tablet 81 mg  81 mg Oral Daily Clent Demark, MD   81 mg at 05/21/14 1024  . atorvastatin (LIPITOR) tablet 80 mg  80 mg Oral q1800 Clent Demark, MD   80 mg at 05/20/14 1710  . azithromycin  (ZITHROMAX) tablet 250 mg  250 mg Oral Q24H Marlena Clipper, NP   250 mg at 05/20/14 1710  . carvedilol (COREG) tablet 6.25 mg  6.25 mg Oral BID WC Clent Demark, MD   6.25 mg at 05/21/14 0741  . Darbepoetin Alfa (ARANESP) injection 40 mcg  40 mcg Intravenous Q Wed-HD Marlena Clipper, NP      . docusate sodium (COLACE) capsule 100 mg  100 mg Oral BID Marlena Clipper, NP   100 mg at 05/21/14 1024  . insulin aspart (novoLOG) injection 0-9 Units  0-9 Units Subcutaneous TID WC Clent Demark, MD   3 Units at 05/21/14 1207  . losartan (COZAAR) tablet 50 mg  50 mg Oral Daily Clent Demark, MD   50 mg at 05/21/14 1023  . nitroGLYCERIN (NITROSTAT) SL tablet 0.4 mg  0.4 mg Sublingual Q5 Min x 3 PRN Clent Demark, MD      . ondansetron (ZOFRAN) injection 4 mg  4 mg Intravenous Q6H PRN Clent Demark, MD      . pantoprazole (PROTONIX) EC tablet 40 mg  40 mg Oral Q0600 Clent Demark, MD   40 mg at 05/21/14 0533  . sevelamer carbonate (RENVELA) tablet 800 mg  800 mg Oral TID WC Jamal Maes, MD   800 mg at 05/21/14 1207  . ticagrelor (BRILINTA) tablet 90 mg  90 mg Oral BID Clent Demark, MD   90 mg at 05/21/14 1024    Prescriptions prior to admission  Medication Sig Dispense Refill Last Dose  . albuterol (PROVENTIL HFA;VENTOLIN HFA) 108 (90 BASE) MCG/ACT inhaler Inhale 2 puffs into the lungs every 4 (four) hours as needed for wheezing or shortness of breath.   05/15/2014  . amLODipine (NORVASC) 10 MG tablet Take 10 mg by mouth daily.    05/17/2014  . calcium acetate (PHOSLO) 667 MG capsule Take 667 mg by mouth 3 (three) times daily with meals.   05/17/2014  . Calcium Carb-Cholecalciferol (CALCIUM 600 + D PO) Take 600 mg by mouth daily with lunch.   05/17/2014  . docusate sodium (COLACE) 100 MG capsule Take 100 mg by mouth at bedtime.   05/16/2014  . Ferrous Sulfate (SLOW FE PO) Take 1 tablet by mouth daily with lunch.   05/17/2014  . fluconazole (DIFLUCAN) 100 MG tablet  Take 100 mg  by mouth daily.   05/17/2014  . furosemide (LASIX) 40 MG tablet Take 40 mg by mouth 2 (two) times daily.   05/17/2014  . gabapentin (NEURONTIN) 600 MG tablet Take 600 mg by mouth 4 (four) times daily - after meals and at bedtime.   05/17/2014  . glipiZIDE (GLUCOTROL) 10 MG tablet Take 10 mg by mouth 2 (two) times daily before a meal.   05/17/2014  . hydrALAZINE (APRESOLINE) 50 MG tablet Take 50 mg by mouth 2 (two) times daily. Lunch and supper   05/17/2014  . insulin glargine (LANTUS) 100 UNIT/ML injection Inject 40 Units into the skin at bedtime.    05/16/2014  . insulin lispro (HUMALOG) 100 UNIT/ML injection Inject 5-10 Units into the skin 3 (three) times daily before meals. PER SLIDING SCALE   05/17/2014  . isosorbide mononitrate (IMDUR) 30 MG 24 hr tablet Take 30 mg by mouth daily.   05/17/2014  . levothyroxine (SYNTHROID, LEVOTHROID) 50 MCG tablet Take 50 mcg by mouth daily before breakfast.   05/17/2014  . losartan (COZAAR) 50 MG tablet Take 50 mg by mouth at bedtime.    05/16/2014  . metoprolol tartrate (LOPRESSOR) 25 MG tablet Take 25 mg by mouth 2 (two) times daily.   05/17/2014 at 600  . Omega-3 Fatty Acids (FISH OIL) 600 MG CAPS Take 600 mg by mouth daily with lunch.   05/17/2014  . omeprazole (PRILOSEC) 20 MG capsule Take 20 mg by mouth 2 (two) times daily before a meal.    05/17/2014  . potassium chloride (K-DUR,KLOR-CON) 10 MEQ tablet Take 10 mEq by mouth 2 (two) times daily.   05/18/19-15  . pregabalin (LYRICA) 25 MG capsule Take 25 mg by mouth at bedtime.    05/16/2014  . rosuvastatin (CRESTOR) 10 MG tablet Take 10 mg by mouth at bedtime.    05/16/2014  . traMADol (ULTRAM) 50 MG tablet Take 50 mg by mouth every 6 (six) hours as needed for moderate pain.   05/16/2014    History reviewed. No pertinent family history. - except patient mother has history of CAD and had CABG in 1999, lived 32 years postop   Review of Systems:     Cardiac Review of Systems: Y or N  Chest Pain Blue.Reese     ]  Resting SOB [   y] Exertional SOB  [ y ]  Orthopnea [  y]   Pedal Edema [  y ]    Palpitations Blue.Reese  ] Syncope  [ n ]   Presyncope [ n  ]  General Review of Systems: [Y] = yes [  ]=no Constitional: recent weight change Blue.Reese  ]; anorexia [  ]; fatigue Blue.Reese  ]; nausea [  ]; night sweats [  ]; fever [  ]; or chills [  ]                                                               Dental: poor dentition[  ]; Last Dentist visit:   Eye : blurred vision [  ]; diplopia [   ]; vision changes [  ];  Amaurosis fugax[  ]; Resp: cough [  ];  wheezing[y  ];  hemoptysis[  n]; shortness of breath[y  ]; paroxysmal nocturnal  dyspnea[ y ]; dyspnea on exertion[ y ]; or orthopnea[  ];  GI:  gallstones[  ], vomiting[  y];  dysphagia[  ]; melena[  ];  hematochezia [  ]; heartburn[  ];   Hx of  Colonoscopy[  ]; GU: kidney stones [  ]; hematuria[  ];   dysuria [  ];  nocturia[  ];  history of     obstruction [  ]; urinary frequency [  ]             Skin: rash, swelling[  ];, hair loss[  ];  peripheral edema[  ];  or itching[  ]; Musculosketetal: myalgias[  ];  joint swelling[  ];  joint erythema[  ];  joint pain[  ];  back pain[  ];  Heme/Lymph: bruising[  ];  bleeding[  ];  anemia[  ];  Neuro: TIA[ n ];  headaches[  ];  stroke[  ];  vertigo[  ];  seizures[n  ];   paresthesias[  ];  difficulty walking[y  ];  Psych:depression[  ]; anxiety[  ];  Endocrine: diabetes[ y ];  thyroid dysfunction[  ];  Immunizations: Flu Florencio.Farrier  ]; Pneumococcal[ n ];  Other:  Physical Exam: BP 127/52 mmHg  Pulse 76  Temp(Src) 98.1 F (36.7 C) (Oral)  Resp 19  Ht 5' 4.5" (1.638 m)  Wt 177 lb 0.5 oz (80.3 kg)  BMI 29.93 kg/m2  SpO2 97%  General appearance: alert, cooperative, appears older than stated age, fatigued and no distress Neurologic: intact Heart: regular rate and rhythm, S1, S2 normal, no murmur, click, rub or gallop Lungs: diminished breath sounds bilaterally Abdomen: healed incision midline ith abdominal wall  eccomosis/hematoma from dilaysis cath removal Extremities: extremities normal, atraumatic, no cyanosis or edema Wound: as noted above   Diagnostic Studies & Laboratory data:   Post  stent films reviewed:    Recent Radiology Findings:  No results found.    Recent Lab Findings: Lab Results  Component Value Date   WBC 12.5* 05/20/2014   HGB 8.1* 05/20/2014   HCT 26.1* 05/20/2014   PLT 322 05/20/2014   GLUCOSE 178* 05/21/2014   CHOL 91 05/21/2014   TRIG 181* 05/21/2014   HDL 29* 05/21/2014   LDLCALC 26 05/21/2014   ALT 89* 05/20/2014   AST 97* 05/20/2014   NA 138 05/21/2014   K 4.4 05/21/2014   CL 96 05/21/2014   CREATININE 3.15* 05/21/2014   BUN 25* 05/21/2014   CO2 28 05/21/2014   INR 1.38 05/20/2014   HGBA1C 6.2* 05/20/2014   Lab Results  Component Value Date   CKTOTAL 70 05/20/2014   CKMB 5.0* 05/20/2014   TROPONINI 5.66* 05/21/2014      Assessment / Plan:   4days s/p acute MI now sp multi vessel stent placement with good early result.- Poor distal targets esp lad and cir for CABG Chronic Kidney Disease Stage V    GFR  <15 Hypertension Diabetes with renal and vascular complications anemia of chronic disease paroxysmal A. Fib with RVR - now sinus    Grace Isaac MD      [address] [place] [address] Office [phone]   Beeper [phone]  05/21/2014 12:32 PM

## 2014-05-21 NOTE — Progress Notes (Signed)
05/21/2014 1055 UR completed. Chart reviewed. [name] Finner RN CCM Case Mgmt phone [phone]

## 2014-05-21 NOTE — Progress Notes (Signed)
Chaplain made initial visit with [name] as a consult was placed. [name] requested prayer. She seemed very confident and cheerful.   [name], Chaplain 05/21/2014

## 2014-05-22 LAB — BASIC METABOLIC PANEL
ANION GAP: 19 — AB (ref 5–15)
BUN: 14 mg/dL (ref 6–23)
CHLORIDE: 95 meq/L — AB (ref 96–112)
CO2: 24 meq/L (ref 19–32)
CREATININE: 2.08 mg/dL — AB (ref 0.50–1.10)
Calcium: 8.5 mg/dL (ref 8.4–10.5)
GFR calc non Af Amer: 25 mL/min — ABNORMAL LOW (ref >90)
GFR, EST AFRICAN AMERICAN: 29 mL/min — AB (ref >90)
Glucose, Bld: 224 mg/dL — ABNORMAL HIGH (ref 70–99)
POTASSIUM: 4.1 meq/L (ref 3.7–5.3)
Sodium: 138 mEq/L (ref 137–147)

## 2014-05-22 LAB — CBC
HCT: 28.2 % — ABNORMAL LOW (ref 36.0–46.0)
Hemoglobin: 9 g/dL — ABNORMAL LOW (ref 12.0–15.0)
MCH: 28.8 pg (ref 26.0–34.0)
MCHC: 31.9 g/dL (ref 30.0–36.0)
MCV: 90.1 fL (ref 78.0–100.0)
PLATELETS: 303 10*3/uL (ref 150–400)
RBC: 3.13 MIL/uL — ABNORMAL LOW (ref 3.87–5.11)
RDW: 16.2 % — AB (ref 11.5–15.5)
WBC: 12.1 10*3/uL — AB (ref 4.0–10.5)

## 2014-05-22 LAB — TROPONIN I: TROPONIN I: 3.37 ng/mL — AB (ref <0.30)

## 2014-05-22 LAB — GLUCOSE, CAPILLARY
GLUCOSE-CAPILLARY: 234 mg/dL — AB (ref 70–99)
Glucose-Capillary: 212 mg/dL — ABNORMAL HIGH (ref 70–99)

## 2014-05-22 LAB — CULTURE, BLOOD (SINGLE)

## 2014-05-22 MED ORDER — ROPINIROLE HCL 0.25 MG PO TABS: 0.2500 mg | ORAL_TABLET | Freq: Three times a day (TID) | ORAL | Status: DC

## 2014-05-22 MED ORDER — ASPIRIN 81 MG PO TBEC: 81.0000 mg | DELAYED_RELEASE_TABLET | Freq: Every day | ORAL | Status: DC

## 2014-05-22 MED ORDER — NEPRO/CARBSTEADY PO LIQD
237.0000 mL | Freq: Two times a day (BID) | ORAL | Status: DC
  Filled 2014-05-22 (×4): qty 237

## 2014-05-22 MED ORDER — VANCOMYCIN HCL IN DEXTROSE 750-5 MG/150ML-% IV SOLN: 750.0000 mg | INTRAVENOUS | Status: DC

## 2014-05-22 MED ORDER — ROPINIROLE HCL 0.25 MG PO TABS
0.2500 mg | ORAL_TABLET | Freq: Three times a day (TID) | ORAL | Status: DC
  Administered 2014-05-22: 0.25 mg via ORAL
  Filled 2014-05-22 (×3): qty 1

## 2014-05-22 MED ORDER — NITROGLYCERIN 0.4 MG SL SUBL: 0.4000 mg | SUBLINGUAL_TABLET | SUBLINGUAL | Status: DC | PRN

## 2014-05-22 MED ORDER — AMIODARONE HCL 200 MG PO TABS: 200.0000 mg | ORAL_TABLET | Freq: Every day | ORAL | Status: DC

## 2014-05-22 MED ORDER — TICAGRELOR 90 MG PO TABS: 90.0000 mg | ORAL_TABLET | Freq: Two times a day (BID) | ORAL | Status: DC

## 2014-05-22 MED ORDER — RENA-VITE PO TABS
1.0000 | ORAL_TABLET | Freq: Every day | ORAL | Status: DC
  Filled 2014-05-22: qty 1

## 2014-05-22 MED ORDER — CARVEDILOL 12.5 MG PO TABS: 12.5000 mg | ORAL_TABLET | Freq: Two times a day (BID) | ORAL | Status: DC

## 2014-05-22 MED ORDER — RENA-VITE PO TABS: 1.0000 | ORAL_TABLET | Freq: Every day | ORAL | Status: DC

## 2014-05-22 NOTE — Progress Notes (Signed)
[facility] Rounding Note Subjective: Had dialysis yesterday and transfused 1 unit of PRBC's Tolerated fine; felt nauseous after HD.   Has not been up walking since admission and is worried about that Not having any chest pain but still with the left abdominal wall pain from the hematome Complains of restless legs - esp the left - says has been tried on gabapentin and lyrica in the past.  Has never tried requip.  Vital signs in last 24 hours: Filed Vitals:   05/22/14 0500 05/22/14 0503 05/22/14 0600 05/22/14 0606  BP: 157/58  176/70 173/64  Pulse: 80  79 85  Temp:      TempSrc:      Resp: 16  14 24   Height:      Weight:  80.5 kg (177 lb 7.5 oz)    SpO2: 100%  100% 100%   Weight change: 0.6 kg (1 lb 5.2 oz)  Intake/Output Summary (Last 24 hours) at 05/22/14 0650 Last data filed at 05/22/14 0500  Gross per 24 hour  Intake 1041.8 ml  Output   2868 ml  Net -1826.2 ml   Physical Exam:  Blood pressure 141/61, pulse 78, temperature 98.1 F (36.7 C), temperature source Oral, resp. rate 17, height 5' 4.5" (1.638 m), weight 80.3 kg (177 lb 0.5 oz), SpO2 100 %. Very nice pale WF Talkative r IJ TDC No JVD Lungs coarse. BS diminised some. S1S2 No S3 1/2/6 murmur USB Abd with large ecchymosis, dressing over prev PD cath site (PD cath recently removed) Tender over the left abd (largest area of hematoma) No sig LE edema SCD's in place  Labs: Basic Metabolic Panel:  Recent Labs Lab 05/20/14 1034 05/20/14 1040 05/20/14 1342 05/21/14 0410 05/21/14 1350 05/22/14 0420  NA 140 142 143 138 132* 138  K 3.6* 3.6* 4.2 4.4 4.1 4.1  CL 100 97 99 96 90* 95*  CO2  --  29 31 28 28 24   GLUCOSE 87 83 55* 178* 237* 224*  BUN 14 15 17  25* 32* 14  CREATININE 2.20* 2.17* 2.42* 3.15* 3.62* 2.08*  CALCIUM  --  9.1 9.0 8.7 8.6 8.5  PHOS  --   --   --  4.1 3.6  --    Liver Function Tests:  Recent Labs Lab 05/20/14 1040 05/20/14 1342 05/21/14 0410 05/21/14 1350  AST 105* 97*   --   --   ALT 93* 89*  --   --   ALKPHOS 130* 128*  --   --   BILITOT 0.5 0.6  --   --   PROT 7.4 7.3  --   --   ALBUMIN 2.4* 2.2* 2.1* 2.3*    Recent Labs Lab 05/20/14 1040 05/20/14 1342 05/21/14 1350 05/22/14 0420  WBC 11.5* 12.5* 10.2 12.1*  NEUTROABS  --  10.1*  --   --   HGB 8.4* 8.1* 7.1* 9.0*  HCT 27.1* 26.1* 23.2* 28.2*  MCV 90.9 91.3 92.4 90.1  PLT 303 322 300 303    Recent Labs Lab 05/20/14 1040 05/20/14 1342 05/20/14 1915 05/21/14 0410 05/22/14 0420  CKTOTAL 70  --   --   --   --   CKMB 5.0*  --   --   --   --   TROPONINI 5.64* 6.81* 6.17* 5.66* 3.37*    Recent Labs Lab 05/21/14 0732 05/21/14 1125 05/21/14 1645 05/21/14 1903 05/21/14 2138  GLUCAP 168* 278* 107* 145* 214*     Recent Labs Lab 05/21/14 1350  FERRITIN 3816*   Studies/Results: No results found.  Medications:   . amiodarone  200 mg Oral BID  . aspirin EC  81 mg Oral Daily  . atorvastatin  80 mg Oral q1800  . azithromycin  250 mg Oral Q24H  . carvedilol  6.25 mg Oral BID WC  . darbepoetin (ARANESP) injection - DIALYSIS  40 mcg Intravenous Q Wed-HD  . docusate sodium  100 mg Oral BID  . insulin aspart  0-9 Units Subcutaneous TID WC  . losartan  50 mg Oral Daily  . pantoprazole  40 mg Oral Q0600  . sevelamer carbonate  800 mg Oral TID WC  . ticagrelor  90 mg Oral BID  . vancomycin  750 mg Intravenous Q M,W,F-HD    Assessment/Plan: 1. NSTEMI - s/p cardiac cath with DES's to LAD, RCA, attempted PCI of circ.(chronically occluded) Per cards. ? Discharge today per yesterday's cards notes but has not been walking any yet... 2. MRSA Peritonitis- Cont vanc q Hd- check vanc level pre HD. Afebrile. WBC 12.5 Would presume need for full 2 weeks treatment. Not sure when had first dose (managed by her [facility] group) but continuing while here 3. S/p non-functional PD cath removal last week with resultant large abd wall hematoma.  Has required total 3 units of blood (2 at [facility], 1  here) 4. Possible URI- was started on zithromax 11/2 x5 day 5. ESRD - MWF HD - will write orders for tomorrow in case still here. 6. Hypertension/volume - Home meds of metop and losartan. Currently on carvedilol and losartan. BP good. Volume control with HD.  Unsure what EDW is . Post HD yesterday was 78.5 kg... 7. Anemia - hgb 8.1. S/p 2 u RBC transfused at [facility]. Watch CBC. Transfused 1 unit of blood on HD 11/4. Started darbe 40/week 11/4  8. Metabolic bone disease - Corrected Ca 10.4. Home med phoslo - changed to sevelamer. Phos looks fine 9. Nutrition - clears now, alb 2.4. Supplements.  Vitamin.   Jamal Maes, MD [facility] [phone] pager 05/22/2014, 6:50 AM

## 2014-05-22 NOTE — Discharge Summary (Signed)
[name], [name] NO.:  [id number]  MEDICAL RECORD NO.:  [id number]  LOCATION:  2H08C                        FACILITY:  [facility]  PHYSICIAN:  Topanga Alvelo N. Terrence Dupont, M.D. DATE OF BIRTH:  Dec 14, 1955  DATE OF ADMISSION:  05/20/2014 DATE OF DISCHARGE:  05/22/2014                              DISCHARGE SUMMARY   ADMITTING DIAGNOSES: 1. Acute inferolateral wall injury. 2. Status post acute non-Q-wave myocardial infarction status post left     catheterization at [facility] on May 19, 2014,     multivessel coronary artery disease. 3. Decompensated mild systolic heart failure. 4. Hypertension. 5. Diabetes mellitus. 6. End-stage renal disease, on hemodialysis, now. 7. Abdominal wall hematoma which is resolving after removal of     peritoneal dialysis catheter. 8. Hypercholesteremia. 9. Status post paroxysmal recurrent atrial fibrillation. 10.Anemia of chronic disease. 11.Coronary artery disease, history of myocardial infarction x2 in the     past.  DISCHARGE DIAGNOSES: 1. Status post acute coronary syndrome/non-Q-wave myocardial     infarction status post percutaneous transluminal coronary     angioplasty stenting to mid left anterior descending coronary     artery and proximal right coronary artery using drug-eluting stents     with excellent results. 2. Status post attempted brief percutaneous coronary intervention to     chronic total occlusion of the left circumflex. 3. Compensated systolic heart failure. 4. Hypertension. 5. Diabetes mellitus. 6. End-stage renal disease, now on hemodialysis. 7. Resolving MRSA peritonitis status post removal of the peritoneal     dialysis catheter. 8. Resolving abdominal wall hematoma. 9. Coronary artery disease, history of myocardial infarction x2 in the     past. 10.Hypercholesteremia. 11.Status post paroxysmal recurrent atrial fibrillation. 12.Anemia of chronic disease, stable. 13.Diabetic  neuropathy.  DISCHARGE HOME MEDICATIONS:  Amiodarone 200 mg 1 tablet daily, aspirin 81 mg 1 tablet daily, carvedilol 12.5 mg 1 tablet twice daily, multivitamin 1 tablet daily, Nitrostat sublingual use as directed, Requip 0.25 mg 1 tablet 3 times daily, Brilinta 90 mg 1 tablet twice daily, vancomycin during hemodialysis as scheduled, Monday, Wednesday, Friday, albuterol 2 puffs every 4 hours as before as needed, calcium plus vitamin D 1 tablet daily, calcium acetate 667 mg 3 times daily with meals, Colace 100 mg at bedtime as needed, fish oil 600 mg 1 capsule daily with lunch, Diflucan 100 mg daily as before, glipizide 10 mg twice daily as before, Lantus insulin 40 units at bedtime as before, Humalog 5 to 10 units 3 times with meals sliding scale as before, levothyroxine 50 mcg daily as before, losartan 50 mg daily at bedtime, omeprazole 20 mg twice daily, Crestor 10 mg daily, slow iron 1 tablet daily as before. The patient has been advised to stop amlodipine, furosemide, gabapentin, hydralazine, isosorbide mononitrate, metoprolol tartrate, potassium chloride, Lyrica, and tramadol.  DIET:  Low-salt, low-cholesterol, 1800 calories ADA diet.  Post PTCA stent instructions have been given.  The patient will resume her hemodialysis in [place] on Monday, Wednesday, Friday.  FOLLOWUP:  With Dr. Neoma Laming, on Monday, May 26, 2014, at 10 a.m. in his office.  CONDITION AT DISCHARGE:  Stable.  BRIEF HISTORY AND HOSPITAL COURSE:  Ms.  [name] is 58 year old female with past medical history significant for multiple medical problems, i.e., coronary artery disease, history of MI in 2002 and subsequently had MI in 2013, treated medically, hypertension, diabetes mellitus, history of congestive heart failure, end-stage renal disease, on peritoneal dialysis since 2013, until last week when she switched to hemodialysis as her peritoneal dialysis catheter was not working.  The patient developed  significant hematoma of the abdominal wall status post removal of peritoneal dialysis catheter, hypercholesteremia, GERD, anemia of chronic disease, paroxysmal AFib with RVR, last night, she converted back into sinus rhythm, was admitted at [facility] on May 17, 2014, because of vague chest pain associated with shortness of breath.  The patient ruled in for non-Q-wave myocardial infarction and subsequently had cardiac catheterization done at [facility] yesterday which showed subtotal occlusion of left circumflex critical mid LAD stenosis and distal diffuse LAD stenosis and proximal 50% to 70% RCA stenosis with EF of 30% to 35%.  The patient was transferred here for further management.  Dr. Humphrey Rolls, primary cardiologist spoke with Dr. Servando Snare over the phone and was felt not the candidate for CABG in view of diffuse distal LAD stenosis and not good targets. EKG done upon arrival to [facility] showed slightly more prominent ST elevation in inferolateral leads as compared to prior EKG done at [place]:  GENERAL:  On examination, she was alert, awake, oriented x3. VITAL SIGNS:  Blood pressure was 163/74, pulse was 95, O2 sats were 100% on non-rebreather mask. HEENT:  Conjunctivae was pink. NECK:  Supple.  Positive JVD. LUNGS:  She had decreased breath sounds at bases with bilateral faint rales. CARDIOVASCULAR:  S1, S2 was normal.  There was soft S3 gallop. ABDOMEN:  She had moderate size left abdominal wall hematoma with ecchymosis.  The patient states hematoma is improving. EXTREMITIES: There was no clubbing, cyanosis, or edema.  LABORATORY DATA:  Sodium was 140, potassium 3.6, BUN 14, creatinine 2.20.  Her troponin I was trending down, it was 5.64, repeat troponin after PCI was 6.81, 6.17, 5.66 which is trending down.  Her last hemoglobin is 9, hematocrit 28.2, white count of 12.2, troponin I today is  3.37, BUN 14, creatinine 2.08.  Blood sugar is 224.  BRIEF HOSPITAL COURSE:  The patient was transferred from [facility] to [facility].  The patient subsequently underwent left cardiac cath and PTCA stenting to LAD and RCA as per procedure report.  PTCA and stenting to LAD and RCA and attempted PCI to CTO of left circumflex as per procedure report.  The patient tolerated the procedure well.  The patient did not have any further episodes of anginal chest pain during the hospital stay.  Both the groins are stable with no evidence of hematoma or bruit.  Her abdominal wall hematoma is soft and improving.  The patient did receive 1 unit of packed RBC yesterday during hemodialysis with appropriate increase of hemoglobin from 7.1 to 9.1.  The patient is up in chair, ambulated in hallway today without any problems.  Her O2 sats remained above 95% on room air.  The patient will be discharged home on above medications and will be followed by Dr. Neoma Laming, on Monday, May 26, 2014, at 10 a.m.  Patient will resume her hemodialysis on Monday, Wednesday, Friday in [place] and will receive also vancomycin for her MRSA peritonitis.  Patient remained afebrile during the hospital stay.     Allegra Lai.  Terrence Dupont, M.D.     MNH/MEDQ  D:  05/22/2014  T:  05/22/2014  Job:  [id number]

## 2014-05-22 NOTE — Discharge Instructions (Signed)
Coronary Angiogram with Stent °Coronary angiography with stent placement is a procedure to widen or open a narrow blood vessel of the heart (coronary artery). When a coronary artery becomes partially blocked, it decreases blood flow to that area. This may lead to chest pain or a heart attack (myocardial infarction). Arteries may become blocked by cholesterol buildup (plaque) in the lining or wall.  °A stent is a small piece of metal that looks like a mesh or a spring. Stent placement may be done right after a coronary angiography in which a blocked artery is found or as a treatment for a heart attack.  °LET YOUR HEALTH CARE PROVIDER KNOW ABOUT: °· Any allergies you have.   °· All medicines you are taking, including vitamins, herbs, eye drops, creams, and over-the-counter medicines.   °· Previous problems you or members of your family have had with the use of anesthetics.   °· Any blood disorders you have.   °· Previous surgeries you have had.   °· Medical conditions you have. °RISKS AND COMPLICATIONS °Generally, coronary angiography with stent is a safe procedure. However, problems can occur and include: °· Damage to the heart or its blood vessels.   °· A return of blockage.   °· Bleeding, infection, or bruising at the insertion site.   °· A collection of blood under the skin (hematoma) at the insertion site. °· Blood clot in another part of the body.   °· Kidney injury.   °· Allergic reaction to the dye or contrast used.   °· Bleeding into the abdomen (retroperitoneal bleeding). °BEFORE THE PROCEDURE °· Do not eat or drink anything after midnight on the night before the procedure or as directed by your health care provider.  °· Ask your health care provider about changing or stopping your regular medicines. This is especially important if you are taking diabetes medicines or blood thinners. °· Your health care provider will make sure you understand the procedure as well as the risks and potential problems  associated with the procedure.   °PROCEDURE °· You may be given a medicine to help you relax before and during the procedure (sedative). This medicine will be given through an IV tube that is put into one of your veins.   °· The area where the catheter will be inserted will be shaved and cleaned. This is usually done in the groin but may be done in the fold of your arm (near your elbow) or in the wrist.    °· A medicine will be given to numb the area where the catheter will be inserted (local anesthetic).   °· The catheter will be inserted into an artery using a guide wire. A type of X-ray (fluoroscopy) will be used to help guide the catheter to the opening of the blocked artery.   °· A dye will then be injected into the catheter, and X-rays will be taken. The dye will help to show where any narrowing or blockages are located in the heart arteries.   °· A tiny wire will be guided to the blocked spot, and a balloon will be inflated to make the artery wider. The stent will be expanded and will crush the plaque into the wall of the vessel. The stent will hold the area open like a scaffolding and improve the blood flow.   °· Sometimes the artery may be made wider using a laser or other tools to remove plaque.   °· When the blood flow is better, the catheter will be removed. The lining of the artery will grow over the stent, which stays where it was placed.   °  AFTER THE PROCEDURE  If the procedure is done through the leg, you will be kept in bed lying flat for about 6 hours. You will be instructed to not bend or cross your legs.   The insertion site will be checked frequently.   The pulse in your feet or wrist will be checked frequently.   Additional blood tests, X-rays, and electrocardiography may be done. Document Released: 01/08/2003 Document Revised: 11/18/2013 Document Reviewed: 01/10/2013 [facility] Patient Information 2015 [place], [place]. This information is not intended to replace advice given to you  by your health care provider. Make sure you discuss any questions you have with your health care provider. Acute Coronary Syndrome Acute coronary syndrome (ACS) is an urgent problem in which the blood and oxygen supply to the heart is critically deficient. ACS requires hospitalization because one or more coronary arteries may be blocked. ACS represents a range of conditions including:  Previous angina that is now unstable, lasts longer, happens at rest, or is more intense.  A heart attack, with heart muscle cell injury and death. There are three vital coronary arteries that supply the heart muscle with blood and oxygen so that it can pump blood effectively. If blockages to these arteries develop, blood flow to the heart muscle is reduced. If the heart does not get enough blood, angina may occur as the first warning sign. SYMPTOMS   The most common signs of angina include:  Tightness or squeezing in the chest.  Feeling of heaviness on the chest.  Discomfort in the arms, neck, back, or jaw.  Shortness of breath and nausea.  Cold, wet skin.  Angina is usually brought on by physical effort or excitement which increase the oxygen needs of the heart. These states increase the blood flow needs of the heart beyond what can be delivered.  Other symptoms that are not as common include:  Fatigue  Unexplained feelings of nervousness or anxiety  Weakness  Diarrhea  Sometimes, you may not have noticed any symptoms at all but still suffered a cardiac injury. TREATMENT   Medicines to help discomfort may include nitroglycerin (nitro) in the form of tablets or a spray for rapid relief, or longer-acting forms such as cream, patches, or capsules. (Be aware that there are many side effects and possible interactions with other drugs).  Other medicines may be used to help the heart pump better.  Procedures to open blocked arteries including angioplasty or stent placement to keep the arteries  open.  Open heart surgery may be needed when there are many blockages or they are in critical locations that are best treated with surgery. HOME CARE INSTRUCTIONS   Do not use any tobacco products including cigarettes, chewing tobacco, or electronic cigarettes.  Take one baby or adult aspirin daily, if your health care provider advises. This helps reduce the risk of a heart attack.  It is very important that you follow the angina treatment prescribed by your health care provider. Make arrangements for proper follow-up care.  Eat a heart healthy diet with salt and fat restrictions as advised.  Regular exercise is good for you as long as it does not cause discomfort. Do not begin any new type of exercise until you check with your health care provider.  If you are overweight, you should lose weight.  Try to maintain normal blood lipid levels.  Keep your blood pressure under control as recommended by your health care provider.  You should tell your health care provider right away about any  increase in the severity or frequency of your chest discomfort or angina attacks. When you have angina, you should stop what you are doing and sit down. This may bring relief in 3 to 5 minutes. If your health care provider has prescribed nitro, take it as directed.  If your health care provider has given you a follow-up appointment, it is very important to keep that appointment. Not keeping the appointment could result in a chronic or permanent injury, pain, and disability. If there is any problem keeping the appointment, you must call back to this facility for assistance. SEEK IMMEDIATE MEDICAL CARE IF:   You develop nausea, vomiting, or shortness of breath.  You feel faint, lightheaded, or pass out.  Your chest discomfort gets worse.  You are sweating or experience sudden profound fatigue.  You do not get relief of your chest pain after 3 doses of nitro.  Your discomfort lasts longer than 15  minutes. MAKE SURE YOU:   Understand these instructions.  Will watch your condition.  Will get help right away if you are not doing well or get worse.  Take all medicines as directed by your health care provider. Document Released: 07/04/2005 Document Revised: 07/09/2013 Document Reviewed: 11/05/2013 [facility] Patient Information 2015 [place], [place]. This information is not intended to replace advice given to you by your health care provider. Make sure you discuss any questions you have with your health care provider.

## 2014-05-22 NOTE — Progress Notes (Signed)
CARDIAC REHAB PHASE I   PRE:  Rate/Rhythm: 88 SR    BP: sitting 177/72    SaO2: 100 2L, 97 RA  MODE:  Ambulation: 90 ft   POST:  Rate/Rhythm: 100 ST    BP: sitting 168/60     SaO2: 95 RA  Pt steady with RW, which she uses at home (actually has rollator). Some SOB and fatigue with distance. Pt has limited exercise capacity PTA. To recliner, pt thankful to walk and sit up. Sts she has 24 hr caregiver for herself and husband. Ed completed with good reception. Encouraged to increase ex tol steadily at home.  [id number]  [name] CES, ACSM 05/22/2014 8:38 AM

## 2014-05-22 NOTE — Progress Notes (Signed)
Inpatient Diabetes Program Recommendations  AACE/ADA: New Consensus Statement on Inpatient Glycemic Control (2013)  Target Ranges:  Prepandial:   less than 140 mg/dL      Peak postprandial:   less than 180 mg/dL (1-2 hours)      Critically ill patients:  140 - 180 mg/dL   Inpatient Diabetes Program Recommendations Insulin - Basal: add Lantus 10 units (home dose 40 units) Diet: add carb modified to current heart healthy diet Thank you  Raoul Pitch [name], RN,CDE Inpatient Diabetes Coordinator [phone] (team pager)

## 2014-05-22 NOTE — Progress Notes (Signed)
Noted restless leg at times-verbalized about taking neurontin at home.

## 2014-05-22 NOTE — Discharge Summary (Signed)
Discharge summary dictated on 05/22/2014 dictation number is [phone]

## 2014-05-23 LAB — TYPE AND SCREEN
ABO/RH(D): O POS
ANTIBODY SCREEN: NEGATIVE
UNIT DIVISION: 0
UNIT DIVISION: 0

## 2014-05-25 ENCOUNTER — Emergency Department: Payer: Self-pay | Admitting: Emergency Medicine

## 2014-05-25 LAB — CBC
HCT: 30.1 % — AB (ref 35.0–47.0)
HGB: 10 g/dL — AB (ref 12.0–16.0)
MCH: 29 pg (ref 26.0–34.0)
MCHC: 33.2 g/dL (ref 32.0–36.0)
MCV: 87 fL (ref 80–100)
Platelet: 342 10*3/uL (ref 150–440)
RBC: 3.45 10*6/uL — AB (ref 3.80–5.20)
RDW: 16.1 % — ABNORMAL HIGH (ref 11.5–14.5)
WBC: 9.9 10*3/uL (ref 3.6–11.0)

## 2014-05-25 LAB — TROPONIN I
TROPONIN-I: 3.8 ng/mL — AB
TROPONIN-I: 3.5 ng/mL — AB

## 2014-05-25 LAB — COMPREHENSIVE METABOLIC PANEL
ALK PHOS: 146 U/L — AB
AST: 31 U/L (ref 15–37)
Albumin: 2.7 g/dL — ABNORMAL LOW (ref 3.4–5.0)
Anion Gap: 11 (ref 7–16)
BILIRUBIN TOTAL: 0.8 mg/dL (ref 0.2–1.0)
BUN: 24 mg/dL — ABNORMAL HIGH (ref 7–18)
Calcium, Total: 8.3 mg/dL — ABNORMAL LOW (ref 8.5–10.1)
Chloride: 96 mmol/L — ABNORMAL LOW (ref 98–107)
Co2: 29 mmol/L (ref 21–32)
Creatinine: 3.42 mg/dL — ABNORMAL HIGH (ref 0.60–1.30)
GFR CALC AF AMER: 18 — AB
GFR CALC NON AF AMER: 15 — AB
Glucose: 129 mg/dL — ABNORMAL HIGH (ref 65–99)
Osmolality: 278 (ref 275–301)
Potassium: 3.5 mmol/L (ref 3.5–5.1)
SGPT (ALT): 61 U/L
Sodium: 136 mmol/L (ref 136–145)
Total Protein: 8.1 g/dL (ref 6.4–8.2)

## 2014-05-25 LAB — CK-MB
CK-MB: 4 ng/mL — AB (ref 0.5–3.6)
CK-MB: 3.3 ng/mL (ref 0.5–3.6)

## 2014-05-25 LAB — LIPASE, BLOOD: LIPASE: 96 U/L (ref 73–393)

## 2014-06-26 ENCOUNTER — Encounter (HOSPITAL_COMMUNITY): Payer: Self-pay | Admitting: Cardiology

## 2014-07-22 ENCOUNTER — Ambulatory Visit: Payer: Self-pay | Admitting: Vascular Surgery

## 2014-07-22 LAB — POTASSIUM: Potassium: 4 mmol/L (ref 3.5–5.1)

## 2014-08-01 ENCOUNTER — Emergency Department: Payer: Self-pay | Admitting: Emergency Medicine

## 2014-08-01 LAB — COMPREHENSIVE METABOLIC PANEL
ALBUMIN: 3.3 g/dL — AB (ref 3.4–5.0)
ALK PHOS: 139 U/L — AB
AST: 27 U/L (ref 15–37)
Anion Gap: 8 (ref 7–16)
BUN: 17 mg/dL (ref 7–18)
Bilirubin,Total: 0.3 mg/dL (ref 0.2–1.0)
CALCIUM: 8.6 mg/dL (ref 8.5–10.1)
CO2: 30 mmol/L (ref 21–32)
Chloride: 96 mmol/L — ABNORMAL LOW (ref 98–107)
Creatinine: 2.51 mg/dL — ABNORMAL HIGH (ref 0.60–1.30)
EGFR (African American): 25 — ABNORMAL LOW
EGFR (Non-African Amer.): 21 — ABNORMAL LOW
Glucose: 102 mg/dL — ABNORMAL HIGH (ref 65–99)
OSMOLALITY: 270 (ref 275–301)
Potassium: 3.4 mmol/L — ABNORMAL LOW (ref 3.5–5.1)
SGPT (ALT): 34 U/L
SODIUM: 134 mmol/L — AB (ref 136–145)
Total Protein: 7.8 g/dL (ref 6.4–8.2)

## 2014-08-01 LAB — CBC
HCT: 43.7 % (ref 35.0–47.0)
HGB: 14 g/dL (ref 12.0–16.0)
MCH: 28 pg (ref 26.0–34.0)
MCHC: 31.9 g/dL — AB (ref 32.0–36.0)
MCV: 88 fL (ref 80–100)
Platelet: 196 10*3/uL (ref 150–440)
RBC: 4.99 10*6/uL (ref 3.80–5.20)
RDW: 17.4 % — ABNORMAL HIGH (ref 11.5–14.5)
WBC: 7.3 10*3/uL (ref 3.6–11.0)

## 2014-08-02 LAB — URINALYSIS, COMPLETE
BLOOD: NEGATIVE
Bilirubin,UR: NEGATIVE
Glucose,UR: 150 mg/dL (ref 0–75)
Hyaline Cast: 5
KETONE: NEGATIVE
LEUKOCYTE ESTERASE: NEGATIVE
Nitrite: NEGATIVE
PH: 7 (ref 4.5–8.0)
Protein: >=500
Specific Gravity: 1.015 (ref 1.003–1.030)
Squamous Epithelial: 1
WBC UR: 6 /HPF (ref 0–5)

## 2014-08-04 LAB — URINE CULTURE

## 2014-08-09 ENCOUNTER — Emergency Department: Payer: Self-pay | Admitting: Emergency Medicine

## 2014-08-09 LAB — CBC WITH DIFFERENTIAL/PLATELET
BASOS ABS: 0.1 10*3/uL (ref 0.0–0.1)
Basophil %: 0.4 %
Eosinophil #: 0.5 10*3/uL (ref 0.0–0.7)
Eosinophil %: 4.2 %
HCT: 40.8 % (ref 35.0–47.0)
HGB: 12.8 g/dL (ref 12.0–16.0)
LYMPHS PCT: 10.3 %
Lymphocyte #: 1.3 10*3/uL (ref 1.0–3.6)
MCH: 27.9 pg (ref 26.0–34.0)
MCHC: 31.4 g/dL — ABNORMAL LOW (ref 32.0–36.0)
MCV: 89 fL (ref 80–100)
MONOS PCT: 3.6 %
Monocyte #: 0.5 x10 3/mm (ref 0.2–0.9)
Neutrophil #: 10.5 10*3/uL — ABNORMAL HIGH (ref 1.4–6.5)
Neutrophil %: 81.5 %
PLATELETS: 163 10*3/uL (ref 150–440)
RBC: 4.59 10*6/uL (ref 3.80–5.20)
RDW: 17.6 % — AB (ref 11.5–14.5)
WBC: 12.8 10*3/uL — AB (ref 3.6–11.0)

## 2014-08-09 LAB — URINALYSIS, COMPLETE
Bacteria: NONE SEEN
Bilirubin,UR: NEGATIVE
Ketone: NEGATIVE
Leukocyte Esterase: NEGATIVE
Nitrite: NEGATIVE
Ph: 8 (ref 4.5–8.0)
Protein: >=500
SQUAMOUS EPITHELIAL: NONE SEEN
Specific Gravity: 1.007 (ref 1.003–1.030)
WBC UR: 4 /HPF (ref 0–5)

## 2014-08-09 LAB — BASIC METABOLIC PANEL
ANION GAP: 11 (ref 7–16)
BUN: 27 mg/dL — ABNORMAL HIGH (ref 7–18)
CHLORIDE: 102 mmol/L (ref 98–107)
CO2: 26 mmol/L (ref 21–32)
Calcium, Total: 9.1 mg/dL (ref 8.5–10.1)
Creatinine: 3.39 mg/dL — ABNORMAL HIGH (ref 0.60–1.30)
EGFR (African American): 18 — ABNORMAL LOW
GFR CALC NON AF AMER: 15 — AB
Glucose: 87 mg/dL (ref 65–99)
Osmolality: 282 (ref 275–301)
POTASSIUM: 4.2 mmol/L (ref 3.5–5.1)
SODIUM: 139 mmol/L (ref 136–145)

## 2014-08-09 LAB — TROPONIN I: TROPONIN-I: 0.29 ng/mL — AB

## 2014-09-13 ENCOUNTER — Inpatient Hospital Stay: Payer: Self-pay | Admitting: Internal Medicine

## 2014-11-04 NOTE — Op Note (Signed)
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   OPERATION: Percutaneous cryoablation of renal cell carcinoma.   ANESTHESIA: General.   SURGEON: Alcus Dad, MD    ASSISTANT: Dr. Jacqlyn Larsen   INDICATIONS: This patient is a 59 year old woman in renal failure who is on dialysis. In the course of her management, a mass was seen in the lower portion of the left kidney. It was highly suspicious for renal cell carcinoma. Her studies have not included contrast but an MRI has confirmed a mass and a biopsy has been done confirming the diagnosis of renal cell carcinoma. The patient is prepared for percutaneous cryoablation.   PROCEDURE: In the prone position under general anesthesia, the patient was positioned on the CT scanner for percutaneous cryoablation. A scan was done and the outline of proposed placement of freeze probes delineated. After prep, a punctate incision was made and the freeze probe placed into the renal mass under fluoroscopic monitoring. The freeze probe was stuck and its position confirmed with CT. A second freeze probe was placed and a stick performed, its position confirmed with fluoro. Following that a 10 minute freeze was delivered. CT scan at this point showed good ice ball involving all of the previously outlined involved area. After a thaw of 8 minutes a second freeze was delivered. Following that a thaw was instituted and the probes removed. A dry dressing was applied to the area. The patient tolerated the procedure well and returned to the recovery area in satisfactory condition.   ____________________________ Alcus Dad, MD jsh:drc D: 06/12/2012 09:38:42 ET T: 06/12/2012 11:03:58 ET JOB#: [id number] cc: Alcus Dad, MD, <Dictator> Alcus Dad MD ELECTRONICALLY SIGNED 06/26/2012  13:28

## 2014-11-06 ENCOUNTER — Emergency Department
Admit: 2014-11-06 | Disposition: A | Discharge status: Home / Self Care | Payer: Self-pay | Admitting: Emergency Medicine

## 2014-11-06 LAB — COMPREHENSIVE METABOLIC PANEL
ALK PHOS: 117 U/L
ANION GAP: 11 (ref 7–16)
AST: 25 U/L
Albumin: 3.7 g/dL
BUN: 49 mg/dL — AB
Bilirubin,Total: 0.4 mg/dL
CALCIUM: 9.5 mg/dL
CHLORIDE: 102 mmol/L
Co2: 30 mmol/L
Creatinine: 3.5 mg/dL — ABNORMAL HIGH
EGFR (Non-African Amer.): 13 — ABNORMAL LOW
GFR CALC AF AMER: 16 — AB
Glucose: 131 mg/dL — ABNORMAL HIGH
Potassium: 3.7 mmol/L
SGPT (ALT): 20 U/L
SODIUM: 143 mmol/L
TOTAL PROTEIN: 7.5 g/dL

## 2014-11-06 LAB — CBC WITH DIFFERENTIAL/PLATELET
Basophil #: 0.1 10*3/uL (ref 0.0–0.1)
Basophil %: 0.9 %
EOS PCT: 9.8 %
Eosinophil #: 1 10*3/uL — ABNORMAL HIGH (ref 0.0–0.7)
HCT: 39.5 % (ref 35.0–47.0)
HGB: 13.1 g/dL (ref 12.0–16.0)
Lymphocyte #: 2.3 10*3/uL (ref 1.0–3.6)
Lymphocyte %: 23.1 %
MCH: 30.4 pg (ref 26.0–34.0)
MCHC: 33.1 g/dL (ref 32.0–36.0)
MCV: 92 fL (ref 80–100)
Monocyte #: 0.6 x10 3/mm (ref 0.2–0.9)
Monocyte %: 6.1 %
NEUTROS PCT: 60.1 %
Neutrophil #: 6 10*3/uL (ref 1.4–6.5)
Platelet: 223 10*3/uL (ref 150–440)
RBC: 4.3 10*6/uL (ref 3.80–5.20)
RDW: 16.2 % — ABNORMAL HIGH (ref 11.5–14.5)
WBC: 9.9 10*3/uL (ref 3.6–11.0)

## 2014-11-06 LAB — APTT: ACTIVATED PTT: 31 s (ref 23.6–35.9)

## 2014-11-06 LAB — TROPONIN I: TROPONIN-I: 0.05 ng/mL — AB

## 2014-11-06 LAB — PROTIME-INR
INR: 0.9
Prothrombin Time: 12.4 secs

## 2014-11-08 NOTE — Discharge Summary (Signed)
PATIENT NAME:  [name], [name] MR#:  [id number] DATE OF BIRTH:  12-19-1955  DATE OF ADMISSION:  05/09/2014 DATE OF DISCHARGE:  05/15/2014  PRIMARY CARE PHYSICIAN: Morton Peters., MD  DISCHARGE DIAGNOSES:  1.  Peritonitis. 2.  End-stage renal disease. 3.  Hypertension.   CONDITION: Stable.  CODE STATUS: DNR.   HOME MEDICATIONS: Please refer to the medication reconciliation list.  SERVICES: The patient needs home health with PT and RN.  DIET: Renal diet.   ACTIVITY: As tolerated.  FOLLOWUP CARE: Follow with PCP and Dr. Holley Raring within 1 to 2 weeks. Follow up with Dr. Ola Spurr within 1 to 2 weeks.   REASON FOR ADMISSION: Fever and abdominal pain for 2 days.   HISTORY OF PRESENT ILLNESS: A 59 year old Caucasian female with a history of ESRD on PD and recent peritonitis who presented to the ED with fever and abdominal pain for 2 days. The patient could not get PD due to clotted PD catheter. In addition, the patient gained weight, about 4 pounds, for 2 days. For detailed history and physical examination, please refer to the admission note dictated by me. The patient was admitted for abdominal pain with the possibility of peritonitis. After admission, the patient has been treated with vancomycin and cefepime. Dr. Candiss Norse evaluated the patient, removed the PD catheter due to clot and possibly infection. The patient was changed to hemodialysis. Patient's PD exit site culture showed Enterococcus faecalis as well as MSSA. Dr. Ola Spurr suggested continue vancomycin during dialysis for 2 weeks.  Anemia. The patient's hemoglobin decreased to 7.1 yesterday and got 1 unit of PRBC transfusion, and hemoglobin increased to 7.6 today. Abdominal CT showed abdominal wall hematoma. The patient has no complaints. She is clinically stable and will be discharged to home with home health today. I discussed the patient's discharge plan with the patient.   TIME SPENT: About 38 minutes.     ____________________________ Demetrios Loll, MD qc:ST D: 05/15/2014 13:06:56 ET T: 05/15/2014 22:45:51 ET JOB#: [id number]  cc: Demetrios Loll, MD, <Dictator> Demetrios Loll MD ELECTRONICALLY SIGNED 05/16/2014 17:02

## 2014-11-08 NOTE — Consult Note (Signed)
PATIENT NAME:  [name], [name] MR#:  [id number] DATE OF BIRTH:  [birth year]  DATE OF CONSULTATION:  05/10/2014  REFERRING PHYSICIAN:   CONSULTING PHYSICIAN:  [name] David, MD  INDICATION FOR CONSULTATION: Elevated troponin.   HISTORY OF PRESENT ILLNESS: This is a 59 year old white female with a past medical history of end-stage renal disease on peritoneal dialysis, developed peritonitis and abdominal pain along with fever for 2 days. She was admitted to the hospital by Dr. Candiss Norse. I was asked to evaluate the patient because she had elevated troponins.   She denies any chest pain or shortness of breath right now, but she did have chest pain a week ago. She says this is not more than usual. She had a nuclear stress test done in 2013; it was an equivocal stress test at that time. It was decided not to pursue cardiac catheterization because of her renal impairment. Her Chest pain is not any worse or better than before.   PAST MEDICAL HISTORY: History of end-stage renal disease, MRSA peritonitis, hypertension, diabetes, coronary artery disease, CHF, hyperlipidemia.   SOCIAL HISTORY: She denies EtOH abuse or smoking.   ALLERGIES: DOXYCYCLINE.   FAMILY HISTORY: Positive for coronary artery disease and diabetes.   PHYSICAL EXAMINATION:  GENERAL: She is alert and oriented x 3, in no acute distress right now.  VITAL SIGNS: Blood pressure is 157/87, respirations 20, pulse 105, temperature 97.7, saturation 98.  NECK: No JVD.  LUNGS: Good air entry. No rales or rhonchi.  HEART: Regular rate and rhythm. Normal S1, S2. No audible murmur.  ABDOMEN: Soft. Slight tenderness diffusely.  EXTREMITIES: No pedal edema.   DIAGNOSTIC DATA: EKG shows normal sinus rhythm, LVH, no acute changes. Troponin is 1.7. BUN is 35, creatinine 3.96, glucose 74. Second set of troponin was 1.6 and third set of troponin was 1.4.   ASSESSMENT AND PLAN: The patient has elevated troponin without any chest pain or any EKG changes;  this is most likely related to renal failure and peritonitis. Initial troponin was 1.7; now it is 1.4. It seems like it is trending down. Without any symptoms, there is probably a noncardiac source of her elevated troponin, most likely end-stage renal disease. She has a history of coronary artery disease, which was presumed because of abnormal stress test in 2013, but has been clinically stable and we have been managing her with medications. Agree with giving the patient aspirin, and continue the rest of the medications, including isosorbide 30 mg, losartan and metoprolol. No further treatment is needed.   Thank you very much for the referral.    ____________________________ [name] David, MD sak:MT D: 05/10/2014 11:15:17 ET T: 05/10/2014 12:19:51 ET JOB#: [id number]  cc: [name] David, MD, <Dictator> [name] David MD ELECTRONICALLY SIGNED 06/19/2014 15:35

## 2014-11-08 NOTE — Discharge Summary (Signed)
PATIENT NAME:  [name], [name] MR#:  [id number] DATE OF BIRTH:  [birth year]  DATE OF ADMISSION:  10/29/2013 DATE OF DISCHARGE:  11/04/2013  PRESENTING COMPLAINT: Abdominal pain.   DISCHARGE DIAGNOSES:  1. Methicillin-resistant Staphylococcus aureus peritonitis. The patient has a peritoneal catheter. 2. Enterococcus faecalis urinary tract infection.  3. End-stage renal disease, on peritoneal dialysis.  4. Hypertension.   CONDITION ON DISCHARGE: Fair.   CODE STATUS: Full code.   FOLLOWUP:  1. Follow up with Dr. Juleen China or Dr. Candiss Norse in 2 to 3 days.  2. The patient to follow up for vancomycin which she is supposed to get through the PD clinic.   MEDICATIONS:  1. Gabapentin 600 mg 3 times a day.  2. Fish oil 1000 mg p.o. daily.  3. Super B complex p.o. daily.  4. Ergocalciferol 50,000 international units 1 capsule orally weekly. 5. Imdur 30 mg extended release p.o. daily.  6. K-Dur 10 mEq p.o. daily.  7. Lasix 1 tablet b.i.d.  8. Calcium with vitamin D 1 tablet p.o. daily.  9. Colace 100 mg at bedtime.  10. Hydralazine 50 mg b.i.d.  11. Omeprazole 20 mg b.i.d.   12. Amlodipine 10 mg daily.  13. Metoprolol 50 mg b.i.d.  14. Tramadol 50 mg daily as needed.  15. ProAir HFA 1 puff every 6 hours.  16. Ferrous sulfate 325 p.o. daily.  17. Glipizide 10 mg b.i.d.  18. Lumigan 1 drop both eyes at bedtime. 19. Combigan 1 drop both eyes b.i.d. at breakfast and at bedtime.  20. Azopt 1% ophthalmic drops to both eyes b.i.d.  21. Lantus 50 units at bedtime.  22. Levothyroxine 50 mcg p.o. daily.  23. Humalog KwikPen according to a sliding scale.  24. Meclizine 25 mg 1 tablet 3 times a day as needed.  25. Losartan 50 mg daily.  26. Acetaminophen/oxycodone 325/5 one tablet every 6 hours as needed.  27. Brinzolamide 1% ophthalmic solution 1 drop to affected eye.  28. Amoxicillin 250 p.o. b.i.d. for 7 days.  29. Crestor 10 mg at bedtime.   DISCHARGE INSTRUCTIONS: 1. Resume occupational  therapy and RN at home.  2. Follow up with PD clinic per Dr. Keturah Barre recommendation and follow up with Dr. Juleen China as outpatient.   CONSULTATION: Nephrology consultation with Dr. Candiss Norse, Dr. Holley Raring.   HOSPITAL COURSE: [name] is a 59 year old Caucasian female with past medical history of diabetes, end-stage renal disease, on peritoneal dialysis, hypertension, diabetes, hypothyroidism and glaucoma, who came in with abdominal pain and fever. She was admitted with:   1. Acute peritonitis. Her peritoneal fluid cultures grew MRSA. She was on IV vancomycin. PD fluid nucleated cell count was trending down. The patient's white cell count was normal. She did not have any fever. Abdominal pain improved. She was continued to be followed by nephrology. Vancomycin management per nephrology has been made through the PD clinic. 2. Sepsis due to peritonitis and Enterococcus faecalis urinary tract infection. She will complete a course with vancomycin and p.o. amoxicillin.  3. End-stage renal disease, on peritoneal dialysis. Nephrology continued dialysis as per their PD protocol.  4. Type 2 diabetes. The patient was continued on her home diabetes regimen.  5. Hyperlipidemia, on Crestor.  6. Hypertension. Metoprolol, Norvasc, hydralazine, Imdur and losartan were continued.  7. Gastroesophageal reflux disease. PPIs were continued.  8. Glaucoma. The patient's home eyedrops were continued.  [facility] stay otherwise remained stable.   CODE STATUS: The patient remained a full code.   TIME SPENT: 40  minutes.   ____________________________ Hart Rochester Posey Pronto, MD sap:lb D: 11/05/2013 06:58:43 ET T: 11/05/2013 07:18:27 ET JOB#: [id number]  cc: Brevan Luberto A. Posey Pronto, MD, <Dictator> Mamie Levers, MD Murlean Iba, MD Ilda Basset MD ELECTRONICALLY SIGNED 11/11/2013 9:10

## 2014-11-08 NOTE — Op Note (Signed)
PATIENT NAME:  [name], [name] MR#:  [id number] DATE OF BIRTH:  [birth year]  DATE OF PROCEDURE:  05/13/2014  PREOPERATIVE DIAGNOSES:  1.  End-stage renal disease requiring dialysis.  2.  Complication of dialysis device with nonfunction of peritoneal catheter.  3.  Peritonitis.   POSTOPERATIVE DIAGNOSES: 1.  End-stage renal disease requiring dialysis.  2.  Complication of dialysis device with nonfunction of peritoneal catheter.  3.  Peritonitis.   PROCEDURE PERFORMED: 1.  Removal of peritoneal dialysis catheter.  2.  Insertion of right internal jugular cuffed tunneled dialysis catheter with ultrasound and fluoroscopic guidance.   SURGEON:  Katha Cabal, MD   ANESTHESIA:  General by LMA.   FLUIDS:  Per anesthesia record.   ESTIMATED BLOOD LOSS:  Minimal.   SPECIMEN:  Culture of purulent material at the exit site of peritoneal catheter.   INDICATIONS:  Ms. Cai is a 59 year old woman who is no longer able to maintain peritoneal dialysis and is being converted to hemodialysis. She is therefore undergoing removal of her peritoneal catheter and placement of a hemodialysis catheter. Risks and benefits are reviewed. All questions answered. The patient has agreed to proceed.   DESCRIPTION OF PROCEDURE:  The patient is taken to the operating room and placed in the supine position. After adequate general anesthesia is induced and appropriate invasive monitors are placed, she is positioned supine and her abdomen and catheter are prepped and draped in sterile fashion. Incision is made through the existing scar, and the dissection is carried down to expose the catheter itself. The cuff is dissected free from the femoral sheath. Pursestring suture of 0 Vicryl is placed, and the catheter is removed from the abdominal cavity. Pursestring suture is secured to close the fascia. Second cuff is then dissected circumferentially, and the catheter is removed in two pieces and photographed for permanent  record. The wound is then washed, and three layers of 3-0 Vicryl are utilized to close the wound and 4-0 Monocryl subcuticular was used to close the skin. Dermabond is applied. Antibiotic ointment and a 2 x 2 is placed at the exit site.   Attention is then turned to the tunneled dialysis catheter. The patient is completely reprepped and redraped with a new set. Gown and gloves are changed as well. Appropriate timeout is called.   The jugular vein is identified with ultrasound. Seldinger needle is inserted into the jugular vein under direct visualization after an image is recorded. Jugular vein is compressible and homogeneous, indicating patency. Wire is then advanced under fluoroscopic guidance. Counterincision is made at the wire insertion site and a small pocket fashioned with a hemostat. Dilator is passed over the wire, and the dilator and peel-away sheath is inserted. Wire and dilator are removed, and a 19 cm tip to cuff Cannon catheter is inserted. Under fluoroscopic guidance, the tips are positioned at the atriocaval junction, and exit site is selected. A small incision is created, and the tunneling device is passed from the exit site to the counterincision on the neck. Tract is dilated, and the catheter is pulled subcutaneously. The catheter is then transected, and the hub is connected without difficulty. Both lumens aspirate and flush easily, and the catheter position is verified under fluoroscopy. Smooth contour is also verified, and 5000 units of heparin per 2 mL is packed into each lumen. The catheter is secured to the skin of the chest wall with 0 silk, and 4-0 Monocryl is used to close the neck counterincision. Dermabond is applied. Sterile dressing  with a Biopatch is applied at the catheter exit site.   The patient tolerated the procedure well, and there were no immediate complications. Sponge and needle counts were correct.   ____________________________ Katha Cabal,  MD ggs:nb D: 05/13/2014 12:46:57 ET T: 05/13/2014 22:55:19 ET JOB#: [id number]  cc: Katha Cabal, MD, <Dictator> Morton Peters., MD Unknown cc  Katha Cabal MD ELECTRONICALLY SIGNED 05/21/2014 11:46

## 2014-11-08 NOTE — H&P (Signed)
PATIENT NAME:  [name], [name] MR#:  [id number] DATE OF BIRTH:  Feb 23, 1956  DATE OF ADMISSION:  10/29/2013  REFERRING PHYSICIAN:  Dr. Reita Cliche.  PRIMARY CARE PHYSICIAN: Laurian Brim.  NEPHROLOGIST:  Dr. Juleen China.  CHIEF COMPLAINT:   Abdominal pain.  A 59 year old Caucasian female with history of end-stage renal disease on peritoneal dialysis since July 2013, diabetes, coronary artery disease, presenting with abdominal pain and fevers. Describes a 3 day duration of abdominal pain, suprapubic in location, nonradiating, intensity 8 to 9 out of 10 and described only as "pain." Worse with movement. No relieving factors. She has associated fevers and chills; however, denies any nausea, vomiting or diarrhea. No further symptomatology. Denies any recent sick contacts. The patient is legally blind, thus unable to tell if there is any change in the fluid color or consistency. Currently without complaints. Pain markedly improved after receiving morphine in the Emergency Department.   REVIEW OF SYSTEMS:  CONSTITUTIONAL: Positive for fever, chills, weakness.  EYES: Denies blurred vision, double vision, eye pain; however, positive for legal blindness, which is unchanged.  EARS, NOSE, THROAT: Denies tinnitus, ear pain, hearing loss.  RESPIRATORY: Denies cough, wheeze, shortness of breath.  CARDIOVASCULAR: Denies chest pain, palpitations, edema.  GASTROINTESTINAL: Positive for abdominal pain as described above. Denies any nausea, vomiting, diarrhea.  GENITOURINARY: Denies dysuria, hematuria.  ENDOCRINE: Denies nocturia or thyroid problems.  HEMATOLOGIC AND LYMPHATICS:  Denies easy bruising, bleeding.  SKIN: Denies rash or lesions. Denies any lesions or discharge around peritoneal dialysis catheter.  MUSCULOSKELETAL: Denies pain in neck, back, shoulders, knees, hips or arthritic symptoms.  NEUROLOGIC: Denies paralysis, paresthesias.  PSYCHIATRIC:  Denies anxiety or depressive symptoms.  Otherwise, full review  of systems  performed by me is negative.  PAST MEDICAL HISTORY: Diabetes, congestive heart failure, coronary artery disease, gastroesophageal reflux disease, hypertension, hyperlipidemia, end-stage renal disease on peritoneal dialysis since July 2013.   SOCIAL HISTORY: Denies any alcohol, tobacco or drug usage.  FAMILY HISTORY:  Positive for coronary artery disease, as well as diabetes.   ALLERGIES: DOXYCYCLINE AS WELL AS SEAFOOD.   HOME MEDICATIONS: Include tramadol 50 mg p.o. q.4 hours as needed for pain, losartan 50 mg p.o. daily, Imdur 30 mg p.o. daily, gabapentin 600 mg p.o. 3 times daily, glipizide 10 mg p.o. b.i.d., Humalog insulin sliding scale, Lantus 50 units subcutaneous at bedtime, meclizine 25 mg p.o. 3 times a day as needed for dizziness, Crestor 10 mg p.o. at bedtime, metoprolol 50 mg p.o. b.i.d., ProAir 1 puff every 6 hours as needed for shortness of breath, Norvasc 10 mg p.o. daily, Lasix 40 mg p.o. b.i.d., Feosol 325 mg p.o. daily, Colace 100 mg p.o. daily, potassium 10 mEq p.o. daily, fish oil 1000 mg p.o. daily, Azopt 1% ophthalmic solution 1 drop b.i.d., Combigan 0.2/0.5% ophthalmic solution 1 drop b.i.d., Lumigan 1 drop to both eyes at bedtime, Prilosec 20 mg p.o. b.i.d., Synthroid 50 mcg p.o. daily, hydralazine 50 mg p.o. b.i.d., calcium 600 plus vitamin D 1 tablet daily, Super B Complex 1 tab daily, ergocalciferol 50,000 International Units 1 capsule weekly.   PHYSICAL EXAMINATION: VITAL SIGNS: Temperature 100.7, heart rate 105, respirations 20, blood pressure 142/67, saturating 100% on room air. Weight 79.8 kg. BMI 30.2.  GENERAL: Well-nourished, well-developed, Caucasian female, currently in no acute distress.  HEAD: Normocephalic, atraumatic.  EYES: Pupils equal, round and reactive to light. Extraocular muscles intact. No scleral icterus.  MOUTH: Dry mucosal membranes. Dentition poor. No abscess noted.  EARS, NOSE AND THROAT: Clear without exudates.  No external lesions.   NECK: Supple. No thyromegaly. No nodules. No JVD.  PULMONARY: Clear to auscultation bilaterally without wheeze, rales or rhonchi. No use of accessory muscles.  Good respiratory effort.  CHEST: Nontender to palpation.  CARDIOVASCULAR: S1, S2, tachycardic. No murmurs, rubs or gallops. No edema. Pedal pulse 2+ bilaterally.  GASTROINTESTINAL: Soft. Distended.  Currently nontender to palpation. Hypoactive bowel sounds. Peritoneal dialysis catheter in place without surrounding edema, erythema or purulent discharge. No hepatosplenomegaly.  MUSCULOSKELETAL: No swelling, clubbing, edema. Range of motion full in all extremities.  NEUROLOGIC: Cranial nerves II through XII intact. No gross focal neurologic deficits. Sensation intact. Reflexes intact.  SKIN: No ulcerations, lesions, rash or cyanosis. Skin warm and dry. Turgor intact.  PSYCHIATRIC: Mood and affect are within normal limits. The patient is awake, alert, oriented x 3.  Insight and judgment intact.   LABORATORY DATA: Sodium 133, potassium 3.5, chloride 96, bicarb 31, BUN 38, creatinine 4.3, glucose 280. LFTs: Albumin 2.5, alk phos 146, AST 11, remainder within normal limits. WBC 14.3, hemoglobin 10.2, platelets of 191. Peritoneal fluid revealing 320 nucleated cells and Gram stain is pending at this time. Urinalysis: 67 WBCs, 1 RBC, leukocyte esterase 3+, nitrite negative, epithelial cells 6.   Chest x-ray performed revealing low lung volumes with bibasilar atelectasis. CT of the abdomen performed revealing large volume of pneumoperitoneum; however, no bowel obstruction or bowel perforation. This is likely infectious with peritonitis.   ASSESSMENT AND PLAN: A 59 year old Caucasian female with history of end-stage renal disease on peritoneal dialysis since 2013, presenting with abdominal pain and fever.  1. Sepsis, meeting septic criteria by heart rate, respiratory rate, temperature and leukocytosis. Evidence points towards peritonitis. Panculture  including blood, urine, peritoneal fluid, peritoneal Gram stain is pending at this time. Received cefotaxime in the Emergency Department. We will increase empiric antibiotic coverage to include cefepime and vancomycin, which would cover gram positives including MRSA, gram negatives and pseudomonas. We will await Gram stain and culture data and downgrade antibiotics at that time. She is noted to also have a UTI, although with this number of epithelial cells is somewhat contaminated; however, the above antibiotic regimen will provide more than adequate coverage. Once again, downgrade  antibiotics as culture data allows.  2.  End-stage renal disease on peritoneal dialysis. We will consult nephrology - Ive discussed the case already while patient in ED 3.  Diabetes. Insulin sliding scale with q.6 hour Accu-Cheks. Continue with Lantus. Hold p.o. agents.  4.  Hypertension. Continue Imdur, Lopressor, Norvasc.  5.  Venous thromboembolism prophylaxis with heparin subQ.   The patient is FULL CODE.  TIME SPENT:  45 minutes.    ____________________________ Aaron Mose. Hower, MD dkh:dmm D: 10/29/2013 20:56:00 ET T: 10/29/2013 21:38:05 ET JOB#: [id number]  cc: Aaron Mose. Hower, MD, <Dictator> DAVID Woodfin Ganja MD ELECTRONICALLY SIGNED 10/29/2013 23:53

## 2014-11-08 NOTE — H&P (Signed)
PATIENT NAME:  [name], BOSTON MR#:  [id number] DATE OF BIRTH:  Dec 01, 1955  DATE OF ADMISSION:  05/17/2014  REFERRING EMERGENCY ROOM PHYSICIAN: Algis Liming. Jimmye Norman, MD  PRIMARY CARE PHYSICIAN: Reed Breech., MD  PRIMARY CARDIOLOGIST: Dr. Neoma Laming.   CHIEF COMPLAINT: Shortness of breath.   HISTORY OF PRESENTING ILLNESS: A 59 year old female who has past history of end-stage renal disease. She had peritonitis and was admitted last week. She had PermCath placement and was started on hemodialysis. She was Discharged 2 days ago from the hospital.   OTHER HISTORY: She had hypertension, diabetes, coronary artery disease, congestive heart failure, and hyperlipidemia, after being discharged yesterday, she had her first outpatient dialysis, and then in the night last night, she started feeling short of breath. She also had chest pain, which was in the center of her chest and was 6-7 out of 10, more pressure kind and nonradiating, so she decided to come back to Emergency Room today in the morning. She states that, after getting some medications in the ER, her pain is gone but she still feels short of breath and is requiring oxygen supplementation. On ER chest x-ray, she was found to have pulmonary edema. BNP was elevated and her troponin was also higher than the last time admission, so the hospitalist team was called in and the ER physician already started on hepatin IV drip.   REVIEW OF SYSTEMS:  CONSTITUTIONAL: Negative for fever, fatigue, weakness, pain, or weight loss.  EYES: No blurring, double vision, discharge or redness.  EARS, NOSE, THROAT: No tinnitus, ear pain or hearing loss.  RESPIRATORY: The patient has some shortness of breath, but no cough or wheezing.  CARDIOVASCULAR: The patient had chest pain yesterday and last night, not now. No palpitations, edema, arrhythmia.  GASTROINTESTINAL: No nausea or vomiting, diarrhea, abdominal pain.  GENITOURINARY: No dysuria, hematuria, or  increased frequency.  ENDOCRINE: No heat or cold intolerance. No excessive sweating.  SKIN: No acne, rashes, or lesions.  MUSCULOSKELETAL: No pain or swelling in the joints.  NEUROLOGICAL: No numbness, weakness, tremor or vertigo.  PSYCHIATRIC: No anxiety, insomnia, bipolar disorder.   PAST MEDICAL HISTORY:  1.  End-stage renal disease on hemodialysis now. Recent peritonitis in October 2015, and so she was switched to hemodialysis.  2.  MRSA infection/peritonitis in the past.  3.  UTI.  4.  Hypertension.  5.  Diabetes.  6.  Coronary artery disease.  7.  CHF. 8.  Gastroesophageal reflux disease.  9.  Hyperlipidemia.   SOCIAL HISTORY: No smoking, drinking or illegal drug use.   FAMILY HISTORY: Coronary artery disease and diabetes.   HOME MEDICATIONS:  1.  Vancomycin injection during each hemodialysis for 2 weeks from discharge, which was 2 days ago.  2.  Tramadol 50 mg oral tablet as needed, 2 times a day.  3.  B complex once a day.  4.  ProAir inhalation 1 puff every 6 hours as needed.  5.  Omeprazole 20 mg oral 2 times a day.  6.  Metoprolol 50 mg oral 2 times a day.  7.  Meclizine 25 mg 3 times a day.  8.  Losartan 50 mg oral once a day.  9.  Levothyroxine 50 mcg once a day.  10.  Lasix 40 mg oral 2 times a day.  11.  Lantus 40 units subcutaneous at bedtime.  12.  Imdur 30 mg oral tablet once a day.  13.  Hydralazine 50 mg 2 times a day.  14.  Humalog  sliding scale 3 times a day.  15.  Glipizide 10 mg oral tablet 2 times a day.  16.  Gabapentin 600 mg oral 3 times a day.  17.  Fish oil 1000 mg oral tablet once a day.  18.  Ferrous sulfate 325 mg oral once a day.  19.  Crestor 10 mg oral once a day.  20.  Colace 1 mg oral once a day.  21.  Calcium 600+ vitamin D once a day.  22.  Aspirin daily oral once a day.  23.  Amlodipine 10 mg oral once a day.  24.  Acetaminophen and oxycodone 325/5 mg every 6 hours.   PHYSICAL EXAMINATION:  VITAL SIGNS: Temperature 99.4, pulse  is 108, respirations 18, blood pressure 128/74, and pulse oximetry is 91% on 4 L oxygen.  GENERAL: The patient is fully alert and oriented to time, place, and person. Does not appear in any acute distress. Follows commands.  HEENT: Head and neck atraumatic. Conjunctivae pink. Oral mucosa moist.  NECK: Supple. No JVD.  RESPIRATORY: Bilateral equal air entry. Crepitation present bilaterally. No wheezing.  CARDIOVASCULAR: S1, S2 present, regular, no murmur.  ABDOMEN: Soft, nontender. Bowel sounds present. No organomegaly. PermCath present on the right upper chest.  SKIN: No acne, rashes, or lesions.  MUSCULOSKELETAL: No pain or swelling in the joints.  NEUROLOGICAL: The right upper extremity power is 5/5, right lower extremity power is 5/5, left upper extremity power is 5/5, left lower extremity power is 5/5. No tremor or rigidity. Follows commands.  PSYCHIATRIC: Does not appear in any acute psychiatric illness at this time.   IMPORTANT LABORATORY RESULTS: Glucose 255. BNP 14,917. BUN 14, creatinine 2.7, sodium 139, potassium 3.8, chloride 99, CO2 of 29, anion gap 11 and calcium 8.2.  Total BUN 7.9. Albumin 2.6, bilirubin 0.6, alkaline phosphate 135, SGOT 37, and SGPT 35. Troponin is 7.40; in last admission it was 1.5. WBC 15.6, hemoglobin is 7.9, platelet count 284,000 and MCV is 89. INR is 1.   Chest x-ray, PA and lateral, in the ER shows diffuse pulmonary edema with bilateral pleural effusion.   ASSESSMENT AND PLAN: A 59 year old female with a past medical history of coronary artery disease and congestive heart failure and end-stage renal disease recently started on hemodialysis, switched from peritoneal dialysis because of infection, hypertension, and diabetes, who came to the Emergency Room with shortness of breath and some chest pain, and found having pulmonary edema and elevated troponin.  1.  Acute diastolic congestive heart failure. Echocardiogram done in last admission in hospital last week.  Ejection fraction is within normal limits, but the patient has diastolic abnormality. Emergency Room physician already gave Lasix injection, but because she is in end-stage renal disease on dialysis, will requiring urgent dialysis. She is being take for dialysis today by nephrology service. We will continue monitoring. Meanwhile, will monitor on telemetry and give oxygen supplementation as needed.  Non ST-elevation myocardial infarction. Troponin is elevated, with chest pain and some EKG changes with ST depression and T-wave inversion in some chest leads. The patient is seen by cardiologist, Dr. Neoma Laming, and is started on hepatin IV drip. We will also give aspirin and will load with Plavix for now, and continue beta blockers and statins. Further management as per cardiology team. We will monitor her on telemetry and follow serial troponins.  2.  Diabetes. We will continue her long-acting insulin and will follow fingersticks, and cover her with short-acting on an as-needed basis.  3.  Hypertension .  We will continue beta blocker, losartan and hydralazine.  4.  Recent peritonitis on antibiotics. We will just continue, as advised by nephrology team, during hemodialysis.  5.  Hyperlipidemia: Continue statin.   CODE STATUS: DNR. I discussed this with the patient, and she understands and agrees for that.   Also discussed with Dr. Neoma Laming about the case.  TOTAL TIME SPENT ON THIS ADMISSION: 50 minutes    ____________________________ Ceasar Lund Anselm Jungling, MD vgv:MT D: 05/17/2014 13:42:51 ET T: 05/17/2014 14:02:00 ET JOB#: [id number]  cc: Ceasar Lund. Anselm Jungling, MD, <Dictator> Morton Peters., MD [name] David, MD Rosalio Mac[facility] MD ELECTRONICALLY SIGNED 05/28/2014 22:18

## 2014-11-08 NOTE — Discharge Summary (Signed)
PATIENT NAME:  [name], MCCOLLOM MR#:  [id number] DATE OF BIRTH:  1955/09/02  DATE OF ADMISSION:  05/17/2014 DATE OF DISCHARGE:  05/20/2014  ADDENDUM: This is an addendum to the discharge summary dictated on November 2nd. The patient was accepted by cardiology, Dr. Terrence Dupont, at [facility]. She left this morning for further evaluation on her coronary artery disease.   ____________________________ Hart Rochester. Posey Pronto, MD sap:sb D: 05/20/2014 08:56:01 ET T: 05/20/2014 09:39:56 ET JOB#: [id number]  cc: Tayana Shankle A. Posey Pronto, MD, <Dictator> Ilda Basset MD ELECTRONICALLY SIGNED 06/05/2014 11:50

## 2014-11-08 NOTE — Consult Note (Signed)
PATIENT NAME:  [name], DOBY MR#:  [id number] DATE OF BIRTH:  Jul 09, 1956  DATE OF CONSULTATION:  05/17/2014  REFERRING PHYSICIAN:   CONSULTING PHYSICIAN:  [name] David, MD  CARDIOLOGY CONSULTATION   INDICATION FOR CONSULTATION: Chest pain and elevated troponin, non-ST segment elevation myocardial infarction.   HISTORY OF PRESENT ILLNESS: This is a 59 year old white female with a past medical history of end-stage renal disease. Initially had peritoneal dialysis, started having hemodialysis since last admission that was last week. She presented to the Emergency Room 1 week ago with shortness of breath and fluid overloaded because of not being able to get peritoneal dialysis due to peritonitis. She had mildly elevated troponins, about 1.5 at that time. She denied any chest pain or shortness of breath, thus it was decided to watch her.   This time, she presents now with chest pain. The patient states that she was doing fine until last night. She started having left precordial chest pain associated with shortness of breath. She kept having pain back and forth, and would get relief initially and then it would come back. Since this happened repeatedly, she decided to come to the hospital.   PAST MEDICAL HISTORY: History of coronary artery disease, history of end-stage renal disease, hypertension, diabetes and hyperlipidemia.   SOCIAL HISTORY: She denies EtOH abuse or smoking.   FAMILY HISTORY: Positive for coronary artery disease.   PHYSICAL EXAMINATION:  GENERAL: She is alert and oriented x 3, in no acute distress.  VITAL SIGNS: Stable.  NECK: No JVD.  LUNGS: Clear.  HEART: Regular rate and rhythm. Normal S1, S2. No audible murmur.  ABDOMEN: Soft, nontender, positive bowel sounds.  EXTREMITIES: No pedal edema.   EKG shows sinus rhythm with sinus tachycardia, 109 beats per minute, some ST depression in leads II, III, and AVF with ST elevation in AVR. Her CPK MB is 212 and 13.6. BNP is  14,970. Troponin came back 7.4.   ASSESSMENT AND PLAN: The patient is probably having non-ST segment elevation myocardial infarction.  EKG has some ST depressions, which are new compared to prior EKG last week. There is ST elevation, 1 mm AVR, that is common with ST depression in other leads, with troponin going up initially on the last admission from 1.5 to 7.5 now, and associated chest pain. She did not have chest pain last week; actually, she was not even having shortness of breath. At that time, she was more fluid overloaded because of lack of dialysis. I advise adding Nitro Paste 1 inch q. 6 hours. Also, will continue heparin after dialysis, and also will put the patient on 300 mg of Plavix today and 75 mg of Plavix starting tomorrow. Also, we will set up the patient for cardiac catheterization on Monday.   Thank you very much for this referral.    ____________________________ [name] David, MD sak:MT D: 05/17/2014 12:02:07 ET T: 05/17/2014 12:42:40 ET JOB#: [id number]  cc: [name] David, MD, <Dictator> [name] David MD ELECTRONICALLY SIGNED 06/19/2014 15:35

## 2014-11-08 NOTE — Discharge Summary (Signed)
PATIENT NAME:  [name], [name] MR#:  [id number] DATE OF BIRTH:  10-Jul-1956  DATE OF ADMISSION:  05/17/2014 DATE OF DISCHARGE:    The patient is on transfer list to [facility] for further evaluation on her coronary artery disease and possible consideration of CABG down the road.   PRESENTING COMPLAINT: Shortness of breath, chest pain.   CURRENT DIAGNOSES:  1. 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 Cardiology recommends coronary artery bypass graft. 2.  End-stage renal disease on hemodialysis, now recently started.  3.  Methicillin-resistant Staphylococcus aureus peritonitis. Continue IV vancomycin at hemodialysis.  4.  Hyperlipidemia.  5.  Type 2 diabetes.  6.  Diabetic neuropathy, nephropathy and retinopathy. 7.  Upper respiratory infection/mild acute bronchitis.  8.  Ischemic cardiomyopathy with ejection fraction of 35% to 40%.  9.  Anemia of chronic kidney disease. The patient is status post 1 unit blood transfusion and will get another an additional one today.   CODE STATUS: No code.  DNR.   CONSULTATION:  Cardiology;  Neoma Laming.  Nephrology;  Monsoor Holley Raring, M.D.     PROCEDURES: Cardiac catheterization done on November 2 shows EF of 40%, severe three vessel disease with occluded mid left circumflex with collaterals from LAD and 90% midportion with TIMI 2 flow, high-grade lesion in proximal RCA, moderate LV dysfunction.   LABORATORY DATA: Hemoglobin and hematocrit is 6.8 and 21.6. White count is 14.4. Platelet count is 273.  Troponin was peaked to 10, 7.4.   B-type natriuretic peptide is 14,970.   CURRENT MEDICATIONS: 1.  Azithromycin 250 mg p.o. daily for 5 days. Started on November 2.  2.  Vancomycin 750 mg IV once at hemodialysis. This is for her MRSA peritonitis. Vancomycin needs to be continued for 2 weeks from 05/12/2014.  3.  Crestor 10 mg at bedtime.  4.  Nitro-Bid 1 inch topical b.i.d.  5.  Metoprolol 25 mg b.i.d.  6.  Meclizine 25 mg  t.i.d. p.r.n. for vertigo.  7.  Losartan 50 mg daily.  8.  Levothyroxine 0.05 mg p.o. daily.  9.  Imdur 30 mg daily.  10.  Sliding scale insulin.  11.  Insulin detemir 40 units daily.  12.  Hydrolysate 50 mg b.i.d.  13.  Gabapentin 300 mg t.i.d.  14.  Ferrous sulfate 325 mg daily with meals.  15.  Docusate 100 mg b.i.d.  16.  Plavix 75 mg daily.  17.  Calcium carbonate with vitamin D 1 tablet daily.  18.  Aspirin 325 mg daily.   HISTORY OF PRESENT ILLNESS: [name] is a 59 year old Caucasian female with multiple medical problems including CAD, congestive heart failure, end-stage renal disease, who was recently started on hemodialysis, switched from peritoneal dialysis, because of MRSA, peritonitis, currently getting IV antibiotics at hemodialysis, comes in with:   1.  Acute hypoxic respiratory failure secondary to congestive heart failure, acute on chronic, systolic with EF most recent is 35% to 40%. The patient's ejection fraction in previous echocardiogram was normal. She was admitted in the Intensive Care Unit. Started on morphine.  Received some IV Lasix and Nitro-Bid along with her cardiac meds. She was seen by Dr. Holley Raring and underwent ultrafiltration with 1500 mL of fluid removal.  Her sats remained more than 92% on 3 liters nasal cannula.  2.  Acute non-Q-wave MI.  Troponin elevated.  Peaked to 10. The patient had ST-T changes on  the EKG.  Cardiology Neoma Laming, M.D., saw the patient. Recommended continue IV heparin  drip. Received a loading dose of Plavix, aspirin, and beta blockers along with statins.  Her cardiac cath showed severe 3 vessel disease along with ejection fraction of 35% to 40%. The patient has been recommended to get CABG. Dr. Humphrey Rolls has been trying to make arrangements with [facility] for tertiary care center referral and transfer.  3.  Type 2 diabetes. Continue her detemir and short-acting insulin.  4.  Hypertension. On beta blockers losartan and hydralazine along with  Imdur.  5.  MRSA peritonitis. The patient is on IV vancomycin. Her PD catheter have been removed. She does have a large hematoma on the abdomen. Her hemodialysis has been tolerated. She will get vancomycin at hemodialysis for 2 weeks.  Start date was October 26.  6.  Hyperlipidemia. Continue Crestor.  7.  Cough, fevers and congestion with elevated white count. Will cover with p.o. Zithromax for 5 days.  7.  Anemia of CKD with renal failure. The patient received 1 unit of blood transfusion. She will receive additional transfusion today totaling it to 2 units. She has had EGD colonoscopy in the past and per her there was nothing acute.   She remained a no code, DNR.   TIME SPENT: 40 minutes.    ____________________________ Hart Rochester Posey Pronto, MD sap:jw D: 05/19/2014 11:44:56 ET T: 05/19/2014 22:00:29 ET JOB#: [id number]  cc: Hawa Henly A. Posey Pronto, MD, <Dictator> Ilda Basset MD ELECTRONICALLY SIGNED 06/05/2014 11:50

## 2014-11-08 NOTE — Consult Note (Signed)
General Aspect complication of dialysis device   Present Illness The patient is a 59 year old female with a history of ESRD previously on PD with recent peritonitis, who presented to the ED several days ago with nonfunction of her catheter. The patient said she had a fever 2 days ago and on the day of admission. In addition, the patient has had abdominal pain, which is diffuse, aching, without radiation.  The patient said she also had some shortness of breath and 4  pounds  weight gain for the past 2 days, but the patient denies any nausea, vomiting. She said she had diarrhea twice with loose stool today. The patient denies any dysuria, hematuria or incontinence. The patient was suspected to have possible peritonitis, was treated with cefepime and vancomycin in the ED. Given the recent problems and the nonfunction of the catheter I am asked to evaluate for appropriate dialysis access.  PAST MEDICAL HISTORY: ESRD on PD, MRSA peritonitis, UTI, hypertension, diabetes, CAD, CHF, GERD, hyperlipidemia.   Home Medications: Medication Instructions Status  Crestor 10 mg oral tablet 1 tab(s) orally once a day (at bedtime) Active  acetaminophen-oxyCODONE 325 mg-5 mg oral tablet 1 tab(s) orally every 6 hours, As needed, pain Active  gabapentin 600 mg oral tablet 1 tab(s) orally 3 times a day Active  Fish Oil 1000 mg oral capsule 1 cap(s) orally once a day (in the morning) Active  Super B Complex oral tablet 1 tab(s) orally once a day Active  ergocalciferol 50,000 intl units (1.25 mg) oral capsule 1 cap(s) orally weekly Active  Calcium 600+D 1 tab(s) orally once a day Active  Colace 100 mg oral capsule 1 cap(s) orally once a day (at bedtime) Active  hydrALAZINE 50 mg oral tablet 1 tab(s) orally 2 times a day Active  omeprazole 20 mg oral delayed release capsule 1 cap(s) orally 2 times a day Active  amlodipine 10 mg oral tablet 1 tab(s) orally once a day Active  metoprolol tartrate 50 mg oral tablet 1 tab(s)  orally 2 times a day Active  tramadol 50 mg oral tablet 1 tab(s) orally , As Needed- for Pain  Active  ProAir HFA 1 puff(s) inhaled every 6 hours, As Needed- for Shortness of Breath  Active  ferrous sulfate 325 mg (65 mg elemental iron) oral tablet 1 tab(s) orally once a day Active  glipiZIDE 10 mg oral tablet 1 tab(s) orally 2 times a day Active  Lumigan 1 drop(s) to each both eyes once a day (at bedtime) Active  Lantus Solostar Pen 100 units/mL subcutaneous solution 50 unit(s) subcutaneous once a day (at bedtime) Active  Imdur 30 mg oral tablet, extended release 1 tab(s) orally once a day (in the morning) Active  K-Tab 10 mEq oral tablet, extended release 1 tab(s) orally once a day Active  Lasix 40 mg oral tablet 1 tab(s) orally 2 times a day Active  levothyroxine 50 mcg (0.05 mg) oral tablet 1 tab(s) orally once a day Active  HumaLOG KwikPen 100 units/mL subcutaneous solution  subcutaneous  Active  meclizine 25 mg oral tablet 1 tab(s) orally 3 times a day, As Needed Active  losartan 50 mg oral tablet 1 tab(s) orally once a day Active    Doxycycline: Rash  Seafood: Headaches  MSG: Headaches  Other- Explain in Comments Line: Other  Case History:  Past Surgical History None   Social History negative tobacco, negative ETOH, negative Illicit drugs   Review of Systems:  Fever/Chills Yes  on admission  Cough No   Sputum No   Abdominal Pain Yes   Diarrhea Yes   Constipation No   Nausea/Vomiting No   SOB/DOE No   Chest Pain No   Telemetry Reviewed NSR   Dysuria No   Physical Exam:  GEN well developed, well nourished   HEENT hearing intact to voice, poor dentition   NECK supple  trachea midline   RESP normal resp effort  no use of accessory muscles   CARD regular rate  LE edema present  positive JVD   ABD positive tenderness  nondistended,  PD cath present   EXTR negative cyanosis/clubbing, positive edema   SKIN No rashes, No ulcers, skin turgor poor    NEURO cranial nerves intact, follows commands, motor/sensory function intact   PSYCH alert, A+O to time, place, person   Nursing/Ancillary Notes: **Vital Signs.:   25-Oct-15 19:56  Vital Signs Type Routine  Temperature Temperature (F) 97.9  Celsius 36.6  Temperature Source oral  Pulse Pulse 88  Respirations Respirations 18  Systolic BP Systolic BP 222  Diastolic BP (mmHg) Diastolic BP (mmHg) 69  Mean BP 91  Pulse Ox % Pulse Ox % 94  Pulse Ox Activity Level  At rest  Oxygen Delivery Room Air/ 21 %   LabObservation:  25-Oct-15 11:06   OBSERVATION Reason for Test  Hepatic:  25-Oct-15 05:39   Albumin, Serum  1.9  TDMs:  25-Oct-15 17:10   Vancomycin, Trough LAB 12 (Result(s) reported on 11 May 2014 at 05:40PM.)  Routine Chem:  25-Oct-15 05:39   Glucose, Serum  113  BUN  35  Creatinine (comp)  4.00  Sodium, Serum 141  Potassium, Serum 3.9  Chloride, Serum 106  CO2, Serum 28  Calcium (Total), Serum  8.2  Phosphorus, Serum 3.8  Anion Gap 7  Osmolality (calc) 290  eGFR (African American)  15  eGFR (Non-African American)  12 (eGFR values <60mL/min/1.73 m2 may be an indication of chronic kidney disease (CKD). Calculated eGFR, using the MRDR Study equation, is useful in  patients with stable renal function. The eGFR calculation will not be reliable in acutely ill patients when serum creatinine is changing rapidly. It is not useful in patients on dialysis. The eGFR calculation may not be applicable to patients at the low and high extremes of body sizes, pregnant women, and vetetarians.)  Routine Hem:  25-Oct-15 05:39   WBC (CBC) 7.6  RBC (CBC)  3.50  Hemoglobin (CBC)  9.7  Hematocrit (CBC)  30.6  Platelet Count (CBC) 283  MCV 88  MCH 27.6  MCHC  31.6  RDW 14.3  Neutrophil % 70.6  Lymphocyte % 16.9  Monocyte % 6.1  Eosinophil % 5.5  Basophil % 0.9  Neutrophil # 5.4  Lymphocyte # 1.3  Monocyte # 0.5  Eosinophil # 0.4  Basophil # 0.1 (Result(s) reported on 11 May 2014 at 06:50AM.)   XRay:    23-Oct-15 19:41, Abdomen Flat and Erect  Abdomen Flat and Erect   REASON FOR EXAM:    position of PD cathter  COMMENTS:       PROCEDURE: DXR - DXR ABDOMEN 2 V FLAT AND ERECT  - May 09 2014  7:41PM     CLINICAL DATA:  Bilateral lower abdominal pain, beginning Wednesday.  Peritoneal dialysis patient.    EXAM:  ABDOMEN - 2 VIEW    COMPARISON:  CT 10/29/2013    FINDINGS:  Peritoneal dialysis catheter noted in the pelvis. Nonobstructive  bowel gas pattern. No free  air. Prior cholecystectomy. No  organomegaly. No acute bony abnormality.     IMPRESSION:  Peritoneal dialysis catheter the pelvis.    No acute findings.      Electronically Signed    By: Rolm Baptise M.D.    On: 05/09/2014 20:20         Verified By: Raelyn Number, M.D.,    Impression 1. Complication of dialysis device.  Given the recent problems and the multiple episodes of peritonitis in association with nonfunctin of the catheter I believe the PD catheter should be removed and a tunneled cather inserted.  The patient can then be transitioned to Hemodialysis for the time being.  I will plan to do both procedures at the same time which will require that they be done on Tuesday.  The risks and benefits as well as alternatives were discussed with the patient all questions answered. She agrees to proceed. 2. End-stage renal disease with peritoneal dialysis catheter malfunction.  Nephrology on consult and managing her electrolytes 3. Hypertension.  continue home meds 4. Diabetes. Sliding scale for now 5. History of congestive heart failure. given lack of dialysis will monitor with serial exam to avoid volume overload   Plan level 3 consult   Electronic Signatures: Hortencia Pilar (MD)  (Signed 25-Oct-15 20:55)  Authored: General Aspect/Present Illness, Home Medications, Allergies, History and Physical Exam, Vital Signs, Labs, Radiology, Impression/Plan   Last Updated: 25-Oct-15  20:55 by Hortencia Pilar (MD)

## 2014-11-08 NOTE — Consult Note (Signed)
PATIENT NAME:  [name], [name] MR#:  [id number] DATE OF BIRTH:  03/09/1956  DATE OF CONSULTATION:  05/13/2014  REFERRING PHYSICIAN:   CONSULTING PHYSICIAN:  Cheral Marker. Ola Spurr, MD  REQUESTING PHYSICIAN: Dr. Posey Pronto    REASON FOR CONSULT: Catheter associated peritonitis.   HISTORY OF PRESENT ILLNESS: A 59 year old female with a history of end-stage renal disease, on peritoneal dialysis with a history of prior MRSA peritoneal dialysis catheter infection in April admitted on October 23 with a malfunctioning peritoneal dialysis catheter as well as abdominal pain and fever. She had fluid drained from her catheter with cultures growing enterococcus. Catheter site drainage was cultured as well and has grown methicillin sensitive Staphylococcus aureus. She underwent removal of the peritoneal dialysis catheter on October 27. She has had placement of a PermCath and has undergone hemodialysis. Plan appears to be to continue hemodialysis. She has been treated with cefepime and vancomycin. Clinically, she reports improvement in her abdominal pain.   PAST MEDICAL HISTORY:  1.  End-stage renal disease, on peritoneal dialysis, with frequent complications including prior MRSA peritonitis in April 2015.  2.  UTI.  3.  Hypertension.  4.  Diabetes.  5.  Coronary artery disease.  6.  CHF.  7.  GERD.  8.  Hyperlipidemia.   SURGICAL HISTORY: Placement of peritoneal dialysis catheter.   SOCIAL HISTORY: Lives with her husband. Does not smoke or drink.   FAMILY HISTORY: Noncontributory.   ALLERGIES: DOXYCYCLINE.   ANTIBIOTICS SINCE ADMISSION: Include cefepime and vancomycin. She has also received Ancef prior to her surgery today.   REVIEW OF SYSTEMS:  Eleven systems reviewed and negative except as per HPI.   PHYSICAL EXAMINATION:   VITAL SIGNS: Temperature 97.8, pulse 98, blood pressure 152/76, respirations 19, saturation 92% on room air.  GENERAL: She is disheveled. She is in no acute distress.  HEENT:  Pupils equal, round, react to light and accommodation. Extraocular movements are intact. Sclerae are anicteric. The oropharynx is clear.  NECK: Supple.  HEART: Regular.  LUNGS: Clear.  ABDOMEN: Slightly distended. She has peritoneal dialysis site that is covered postsurgically. She is mild tender to palpation.  EXTREMITIES: No clubbing, cyanosis or edema.  NEUROLOGIC: She is alert and oriented x3. Grossly nonfocal neuro exam.   DATA: Blood cultures 2 of 2 are no growth. Peritoneal fluid grew Enterococcus faecalis sensitive to ampicillin and linezolid. Peritoneal dialysis exit site grew MSSA pan sensitive. Prior peritoneal fluid in April 2015 grew MRSA. White blood count on admission was noted to be 11.1, currently it is 7.6, hemoglobin 9.7, platelets 283. Peritoneal fluid done October 24 had 6000 nucleated cells, 98% neutrophils. Troponin was positive. Vancomycin trough was 12. LFTs were normal except low albumin at 1.9. Renal function consistent with end-stage renal disease.   IMAGING: Abdominal x-ray showed peritoneal dialysis catheter in the pelvis. No acute findings. Chest x-ray showed cardiomegaly and vascular congestion.   IMPRESSION: A 59 year old female, history of end-stage renal disease, on peritoneal dialysis with a history of prior methicillin-resistant Staphylococcus aureus peritoneal catheter infection, now with Enterococcus faecalis peritonitis as well as an methicillin-sensitive Staphylococcus aureus exit site infection. Her catheter has been removed October 27 and she is on hemodialysis through a PermCath. Clinically, she is improving without a white count or fever.   RECOMMENDATIONS:  1.  Antibiotics can be consolidated to vancomycin given at dialysis. This will cover the Enterococcus and the MSSA. Another option would be oral Augmentin although I think the vancomycin would  be a better choice  since it can be given at dialysis.  2.  Recommend a 2 week duration from removal of the  catheter.   Thank you for the consult. I will be glad to follow with you.     ____________________________ Cheral Marker. Ola Spurr, MD dpf:AT D: 05/13/2014 32:02:33 ET T: 05/14/2014 03:14:38 ET JOB#: [id number]  cc: Cheral Marker. Ola Spurr, MD, <Dictator> Xan Sparkman Ola Spurr MD ELECTRONICALLY SIGNED 05/20/2014 13:35

## 2014-11-08 NOTE — H&P (Signed)
PATIENT NAME:  [name], [name] MR#:  [id number] DATE OF BIRTH:  May 23, 1956  DATE OF ADMISSION:  05/09/2014  PRIMARY CARE PHYSICIAN: Dr. Hardin Negus REFERRING PHYSICIAN: Yetta Numbers. Karma Greaser, MD  CHIEF COMPLAINT: Fever and abdominal pain for 2 days.   HISTORY OF PRESENT ILLNESS: A 59 year old Caucasian female with a history of ESRD on PD, recent peritonitis, who presented to the ED with the above chief complaint. The patient is alert, awake, oriented, in no acute distress. The patient said she had a fever 2 days ago and today. In addition, the patient has had abdominal pain, which is diffuse, aching, without radiation, for the past 2 days. The patient could not get PD due to a clotted PD catheter. The patient said she also had some shortness of breath and 4  pounds  weight gain for the past 2 days, but the patient denies any nausea, vomiting. She said she had diarrhea twice with loose stool today. The patient denies any dysuria, hematuria or incontinence. The patient was suspected to have possible peritonitis, was treated with cefepime and vancomycin in the ED. Dr. Candiss Norse suggested that the patient continue antibiotics.   PAST MEDICAL HISTORY: ESRD on PD, MRSA peritonitis, UTI, hypertension, diabetes, CAD, CHF, GERD, hyperlipidemia.   SOCIAL HISTORY: No smoking, drinking or illicit drugs.   FAMILY HISTORY: CAD, diabetes.   ALLERGIES: DOXYCYCLINE.  HOME MEDICATIONS: Medication reconciliation list is not done yet; will update later.   REVIEW OF SYSTEMS:  CONSTITUTIONAL: The patient has a fever, chills. No headache or dizziness, but has generalized weakness.  EYES: No double vision or blurry vision, but the patient is legally blind, barely sees things.  HEENT: No epistaxis, dysphagia or slurred speech.  CARDIOVASCULAR: Positive for chest pain on the left side under her left breast. No palpitations. No orthopnea, nocturnal dyspnea. No leg edema.  PULMONARY: Positive for cough and shortness of breath, but no  sputum or hemoptysis.  GASTROINTESTINAL: Positive for abdominal pain, but no nausea, vomiting. The patient had loose stool twice. No melena or bloody stool.  GENITOURINARY: No dysuria, hematuria, or incontinence.  SKIN: No rash or jaundice.  NEUROLOGIC: No syncope, loss of consciousness, or seizure.  ENDOCRINE: No polyuria, polydipsia, heat or cold intolerance.  HEMATOLOGY: No easy bruising or bleeding.   PHYSICAL EXAMINATION: VITAL SIGNS: Temperature 100.2, blood pressure 152/78, pulse 96, oxygen saturation 94% on room air.  GENERAL: The patient is alert, awake, oriented, in no acute distress.  HEENT: Pupils round, equal, reactive to light and accommodation. Moist oral mucosa. Clear pharynx.  NECK: Supple. No JVD or carotid bruit. No lymphadenopathy. No thyromegaly.  CARDIOVASCULAR: S1, S2 regular rate and rhythm. No murmurs or gallops.  PULMONARY: Bilateral air entry. Mild basilar rales. No use of accessory muscles to breathe.  ABDOMEN: Soft, mild tenderness. No rigidity. No rebound. Bowel sounds present. PD catheter in situ.  EXTREMITIES: No edema, clubbing or cyanosis. No calf tenderness. Bilateral pedal pulses present.  NEUROLOGIC: A and O x3. No focal deficit. Power 5/5. Sensation intact.   LABORATORY DATA: Urinalysis is negative. Troponin 1.7. WBC 11.1, hemoglobin 9.9. platelets 305,000. Glucose 74, BUN 35, creatinine is 3.96. Electrolytes normal. CK-MB 11.3. EKG shows normal sinus rhythm at 99 bpm.   No chest x-ray. Will order.   IMPRESSIONS:  1. Abdominal pain. Need to rule out peritonitis. 2. End-stage renal disease with peritoneal dialysis catheter malfunction and clot.  3. Hypertension.  4. Diabetes.  5. History of congestive heart failure.  6. Elevated troponin.  PLAN OF TREATMENT: 1. The patient will be admitted to medical floor.  2. Will start a telemonitor and continue vancomycin  and cefepime.  3. Follow up CBC blood culture.  4. Follow up with Dr. Candiss Norse for  dialysis and clotted PD catheter.  5. For elevated troponin, the etiology is not clear. We will start aspirin and statin, and a cardiology consult.  6. Hypertension. Continue hypertension medication.  7. For diabetes, start a sliding scale Levemir.   8. I discussed the patient's condition and plan of treatment with the patient.   CODE STATUS: The patient wants DO NOT RESUSCITATE.   TIME SPENT: About 62 minutes.    ____________________________ Demetrios Loll, MD qc:lm D: 05/09/2014 19:02:10 ET T: 05/09/2014 19:50:57 ET JOB#: [id number]  cc: Demetrios Loll, MD, <Dictator> Demetrios Loll MD ELECTRONICALLY SIGNED 05/10/2014 11:55

## 2014-11-09 NOTE — H&P (Signed)
PATIENT NAME:  [name], [name] MR#:  [id number] DATE OF BIRTH:  04-21-1956  DATE OF ADMISSION:  11/03/2011  PRIMARY CARE PHYSICIAN: Morton Peters., MD   REFERRING PHYSICIAN: Dr. Hassell Done.   CHIEF COMPLAINT: Right leg swelling and pain.   HISTORY OF PRESENT ILLNESS: The patient is a 59 year old obese, Caucasian female with history of legal blindness, insulin-dependent diabetes mellitus, hypertension, chronic kidney disease and anemia, who presents with the above symptoms. The patient stated that about two weeks ago she had a fall in a restaurant bathroom, hitting the inside of her knee. There was no skin tear. The following day, the patient did develop redness in the anterior knee area. The patient started to develop worsening of redness and pain over several days. The patient did have x-ray of the right knee here on 10/25/2011 which did not show any significant osseous abnormalities. The patient was seen by her primary care physician last Friday and started on amoxicillin and Vicodin, per patient. She has taken that for six days, however, has had rigors, fevers, increased swelling, pain and redness in the leg. The patient has had decreased p.o. intake. The patient has decreased urine output. She still has been taking her blood pressure and diabetes medications. On arrival, she was found to have significant leukocytosis of about 20,000 and had tachycardia with initial pulse rate of 115. She has significant redness and evidence of cellulitis in the right knee. The patient was given Zosyn and linezolid, and the Hospitalist Service was contacted for further evaluation and management. The patient also was noted to have renal failure and significant anemia.   PAST MEDICAL HISTORY:  1. Insulin-dependent diabetes mellitus.  2. History of gastroesophageal reflux disease.  3. Hyperlipidemia.  4. Hypertensive cardiovascular disease. 5. Glaucoma. 6. Legal blindness.  7. History of hysterectomy.   8. History of right foot surgery.   CURRENT MEDICATIONS:  1. Azopt 1% ophthalmic solution, 1drop to each eye t.i.d.   2. Calcium 1 tab daily.  3. Colace 100 mg once a day.  4. Combigan 0.2/0.5% ophthalmic solution, 1 drop to each eye breakfast and bedtime. 5. Crestor 10 mg daily. Fish oil 1000 mg daily.  6. Gabapentin 600 mg t.i.d.  7. Glipizide 10 mg b.i.d.   8. HCTZ/lisinopril 12.5/20 mg, 1 tab daily.  9. Klor-Con 10 mEq daily.  10. Lantus 45 units daily.  11. Lumigan 1drop to each affected eye daily.  12. Meloxicam 7.5 mg b.i.d.   13. Norvasc 10 mg daily.  14. Omeprazole 20 mg daily.  15. Ramipril 10 mg daily.  16. Super B complex oral tablet, 1 tab daily.   ALLERGIES: Doxycycline, MSG food.   FAMILY HISTORY: Mom with skin cancer. Brother with bladder cancer.   SOCIAL HISTORY: No tobacco, although she is exposed to secondhand smoke. No alcohol or drug use. She is married.   REVIEW OF SYSTEMS: CONSTITUTIONAL: There is fatigue, weakness, and fevers and chills. No weight changes. EYES: The patient has legal blindness and glaucoma. ENT: No tinnitus, hearing loss. Had some sore throat a few days ago. RESPIRATORY: No cough, wheezing, shortness of breath or COPD. CARDIOVASCULAR: No chest pain, orthopnea, or edema. No history of syncope. The patient has high blood pressure. GASTROINTESTINAL: Some nausea. No vomiting, diarrhea, constipation. No abdominal pain. No black tarry stools. No bright red blood per rectum. GENITOURINARY: Denies dysuria, although she has had decreased urine output recently. ENDOCRINE: Denies polyuria, nocturia, or thyroid problems. HEME/LYMPH: Has history of anemia. No history of  recent bleeding or swollen glands. SKIN: Skin rash as above. MUSCULOSKELETAL: Has some arthritis. No gout. NEUROLOGICAL: Has numbness in her feet and her right hand which is chronic. No dementia, cerebrovascular accident, transient ischemic attack, or seizures. PSYCHIATRIC: No anxiety or  insomnia.    PHYSICAL EXAMINATION:  VITAL SIGNS: Temperature on arrival 98.2, pulse rate 115, respiratory rate 18, blood pressure 182/82, oxygen saturation 100% on room air.    GENERAL: The patient is an awake Caucasian obese female sitting in bed in mild distress.   HEENT: Normocephalic, atraumatic. Pupils are equal and reactive. Anicteric sclerae. Somewhat dry mucous membranes.   NECK: Supple. No thyroid tenderness.   CARDIOVASCULAR: S1 and S2 tachycardic. No murmurs, rubs, or gallops.   LUNGS: Clear to auscultation without wheezing or rhonchi.   ABDOMEN: Soft, nontender, nondistended.   EXTREMITIES: Right lower extremity: There is significant erythema, edema and tenderness to palpation involving areas about slightly over the middle of the knee going interiorly towards the ankle. Cellulitis is a mostly medially with some effusions in the knee. There is decreased range of motion in the knee secondary to pain.   NEUROLOGICAL: Cranial nerves II through XII are grossly intact. Strength is 5/5 in all extremities.   PSYCHIATRIC: Awake, alert, oriented x3, pleasant, and conversant.   LABORATORY, DIAGNOSTIC AND RADIOLOGICAL DATA:  Glucose 253, BUN 64, creatinine 4.85-it was 1.75 in October of 2011 and 1.66 in January of 2011, sodium is 133, potassium is 4.3, chloride 102. Serum CO2 is 16, anion gap is 15. Albumin is 2.1, alkaline phosphatase is 201, AST 38, ALT 48.  WBC is 20.5 thousand, hemoglobin 7.7-it was 10.1 in January 2011, hematocrit is 25.2, platelets are 360, MCV is 78.  Urinalysis: No nitrates or leukocyte esterase, trace bacteria, 2 WBCs.   ASSESSMENT AND PLAN: We have a 59 year old female, legally blind, with insulin-dependent diabetes, hypertension, chronic kidney disease with chronic anemia, who presents with severe sepsis in the setting of right lower extremity cellulitis. The patient also has acute on chronic renal failure, currently stage V, with some mild acidosis as well as  profound anemia with hemoglobin in the 7s. At this point, we would admit the patient for IV antibiotics. I discussed the case with Dr. Sabra Heck from Orthopedics. I would start the patient on Zosyn and Zyvox renally dosed. Would obtain blood cultures. There is no indication of acute aspiration for now, per Orthopedics. I would obtain x-rays of the knee and start the patient on IV fluids. I would consult Infectious Disease in the morning. I have marked the involving the area. I would obtain x-rays of the lower extremity and further imaging, if needed, depending on the hospital course. The patient does appear to have acute on chronic renal failure with creatinine of 4.85. The patient has had decreased urine output and p.o. intake as well as being on NSAIDs as well as ACE inhibitor and HCTZ. We would hold ACE inhibitors and HCTZ as well as the NSAIDs. There possibly is potential ATN here as well as prerenal causes such as decreased intake. We would start the patient on gentle IV fluids and consult with Nephrology in the morning. There is no acute indication for dialysis. The patient is not volume overloaded and is not uremic. Potassium is also okay. The patient also has significant anemia. This appears to be acute on chronic, likely chronic disease related. The patient has MCV which is in the 70s. The patient has no recent bleeding or dark stools. We would transfuse  1 unit, and the indication and consent have been discussed with the patient and she is agreeable. We would transfuse 1 unit and check hemoglobin and hematocrit. I would also send in iron studies and stool guaiac. In regards to her diabetes, we would continue the Lantus and sliding scale. We will check a hemoglobin A1c. in regards to hypertension, we would hold the ACE inhibitor and HCTZ. The patient's blood pressure is elevated. There is likely an element of pain and the fact that she has not taken her medicine today. I would continue the amlodipine and start  p.r.n. labetalol IV. I would resume her PPI and Crestor. I would also resume her eyedrops.   The above was discussed with the patient, her husband, and they are agreeable to this plan.   CODE STATUS:  The patient is FULL CODE.     CRITICAL CARE TIME SPENT:  80 minutes.   ____________________________ Vivien Presto, MD sa:cbb D: 11/03/2011 18:35:41 ET T: 11/03/2011 19:27:50 ET JOB#: [id number]  cc: Vivien Presto, MD, <Dictator> Morton Peters., MD Vivien Presto MD ELECTRONICALLY SIGNED 11/13/2011 19:29

## 2014-11-09 NOTE — Consult Note (Signed)
PATIENT NAME:  [name], [name] MR#:  [id number] DATE OF BIRTH:  Mar 28, 1956  DATE OF CONSULTATION:  11/04/2011  REFERRING PHYSICIAN:  Abel Presto, MD  CONSULTING PHYSICIAN:  Heinz Knuckles. Doree Kuehne, MD  REASON FOR CONSULTATION: Cellulitis.   HISTORY OF PRESENT ILLNESS: The patient is a 59 year old white female with a past history significant for diabetes who was admitted yesterday with approximately a 2-1/2 week history of redness and swelling of the right knee and leg. The patient had initially injured her leg when she fell in a public restroom. She did not have any break in the skin and was able to ambulate following the injury. She states that she went to the Emergency Room and had x-rays done and a right knee film from April 9th showed no significant abnormalities. She states that she had been having some redness and swelling since the initial injury but it had gotten worse. She did not get any antibiotics when she went to the Emergency Room. She represented on April 18th and was subsequently admitted. She has been started on Zosyn and Linezolid IV. She was seen by Orthopedics who felt that her knee joint while possibly having some effusion did not have clinical evidence for septic joint. Her white count was elevated on admission and has come down slightly.   ALLERGIES: Doxycycline, MSG, and seafood.   PAST MEDICAL HISTORY:  1. Diabetes.  2. Esophageal reflux.  3. Hypercholesterolemia.  4. Hypertension.  5. Glaucoma with legal blindness.   FAMILY HISTORY: Positive for bladder cancer and skin cancer.   SOCIAL HISTORY: She lives with her husband. She does not smoke but her husband does. She does not drink. No injecting drug use history.  REVIEW OF SYSTEMS: GENERAL: Positive for some fevers and chills and some malaise. HEENT: No headache. No sinus congestion. No sore throat. Positive decreased vision related to her legal blindness. NECK: No stiffness or swollen glands. RESPIRATORY: No cough. No  shortness of breath. No sputum production. CARDIAC: No chest pains or palpitations. GI: Some nausea. No vomiting. No abdominal pain. She has intermittent diarrhea and constipation which is chronic for her. GU: No complaints. MUSCULOSKELETAL: She has had swelling and pain in the right knee and foot. NEUROLOGIC: No focal weakness. PSYCHIATRIC: No complaints. SKIN: She has had redness over the right lower extremity that has worsened recently.   PHYSICAL EXAMINATION:   VITAL SIGNS: T-max 99.1, pulse 104, blood pressure 136/75, 97% on room air.   GENERAL: 59 year old obese white female in no acute distress.   HEENT: Normocephalic, atraumatic. Pupils equal and reactive to light. Extraocular motion intact. Sclerae, conjunctivae, and lids are without evidence for emboli or petechiae. Oropharynx shows no erythema or exudate.   NECK: Midline trachea. No lymphadenopathy. No thyromegaly. Full range of motion.   RESPIRATORY: Clear to auscultation bilaterally with good air movement. No focal consolidation.   CARDIAC: Regular rate and rhythm without murmur, rub, or gallop.   ABDOMEN: Obese, soft, nontender, nondistended. No hepatosplenomegaly. No hernias noted.   EXTREMITIES: No evidence for tenosynovitis.   SKIN: She has an erythematous area extending from the right knee down to above the ankle on the right. The area was slightly warm to touch. The erythema was fairly coalesced. No ulcerations were present. The redness was blanching to the touch.   NEUROLOGIC: The patient was awake and interactive, moving all four extremities.   PSYCHIATRIC: Mood and affect appeared normal.   LABORATORY DATA: BUN 65, creatinine 4.76, bicarbonate 19, anion gap 14, white count  15.2, hemoglobin 8.1, platelet count 321, ANC 11.9. White count on admission was 20.5. Blood cultures from admission show no growth. A urinalysis was unremarkable. A right knee x-ray showed no abnormalities. Renal ultrasound is currently pending.  Venous Doppler's are currently pending.   IMPRESSION: This is a 59 year old white female with a history of diabetes admitted with cellulitis and worsening renal function.   RECOMMENDATIONS:  1. While she does not recall any breaks in the skin, she did have local trauma to her right knee/leg around the onset of her discomfort. I suspect this was the inciting incident for infection. She does not have a diabetic ulcer present.  2. Most cellulitis is caused by Strep species with minority being caused by Staph (about half of which is MRSA). Without an ulcer, Strep remains the most likely organism.  3. Will change her to Keflex.  4. If she continues to improve, would treat for 10 days.  5. Would follow-up her white count.   Thank you very much for involving me in Ms. Cumpton's care. This is a low level Infectious Disease consult.   ____________________________ Heinz Knuckles. Pieper Kasik, MD meb:drc D: 11/04/2011 16:41:34 ET T: 11/05/2011 10:22:47 ET JOB#: [id number]  cc: Heinz Knuckles. Rayn Enderson, MD, <Dictator> Briley Sulton E Jema Deegan MD ELECTRONICALLY SIGNED 11/07/2011 11:05

## 2014-11-09 NOTE — Consult Note (Signed)
Impression: 59yo WF w/ h/o DM admitted with cellulitis.  While she does not recall any break in the skin, she did have local trauma to her right knee/leg around the onset of her discomfort.  I suspect that this was the inciting incident for her infection.  She does not have a diabetic ulcer present.  Most cellulitis is caused by Strep spp., with a minority caused by Staph (about half of which is MRSA).  Without an ulcer, Strep remains the most likely organism. Will change her to keflex. If she continues to improve, would treat for 10 days. Follow WBC.  Electronic Signatures: Deira Shimer, Heinz Knuckles (MD)  (Signed on 19-Apr-13 16:34)  Authored  Last Updated: 19-Apr-13 16:34 by Manuel Lawhead, Heinz Knuckles (MD)

## 2014-11-09 NOTE — Consult Note (Signed)
Chief Complaint:   Subjective/Chief Complaint Right leg cellulitis--  less reddness, feels better with moist heat.  range of motion of knee fairly good.  tender only over residual indurated spots.  white blood count up slightly but afebrile.  disussed with Dr Clayborn Bigness.  continue present treatment.   Electronic Signatures: Park Breed (MD)  (Signed 23-Apr-13 13:02)  Authored: Chief Complaint   Last Updated: 23-Apr-13 13:02 by Park Breed (MD)

## 2014-11-09 NOTE — H&P (Signed)
PATIENT NAME:  [name], [name] MR#:  [id number] DATE OF BIRTH:  12/15/1955  DATE OF ADMISSION:  12/23/2011  REFERRING PHYSICIAN: Dr. Thomasene Lot.   FAMILY PHYSICIAN: Laurian Brim, Brooke Bonito., MD  REASON FOR ADMISSION: Progressive shortness of breath with worsening anemia and acute on chronic renal failure.   HISTORY OF PRESENT ILLNESS: The patient is a 59 year old female with a significant history of insulin-dependent diabetes, hyperlipidemia, and hypertensive cardiovascular disease who was admitted approximately 6 weeks ago with acute on chronic renal failure at which time she was found to have a renal mass. She has been followed by Nephrology and Urology with no definitive diagnosis. Presents to the emergency room today with shortness of breath where she was found to be in florid congestive heart failure with worsening anemia and acute on chronic renal failure. She was also noted to have an elevated troponin. She denies chest pain. No fevers. No nausea or vomiting. She is now admitted for further evaluation.   PAST MEDICAL HISTORY:  1. Recent right lower extremity cellulitis.  2. Chronic renal failure.  3. Left kidney mass of unclear etiology.  4. Insulin-dependent diabetes mellitus. 5. Benign hypertension.  6. Glaucoma.  7. Hyperlipidemia.  8. Anemia of chronic disease.  9. Vitamin D deficiency.  10. GE reflux disease.  11. Hypertensive disease.  12. Status post hysterectomy.  13. Status post right foot surgery.   MEDICATIONS:  1. Oxycodone 5 mg p.o. q4 hours p.r.n. pain.  2. Prilosec 20 mg p.o. daily.  3. Norvasc 10 mg p.o. daily.  4. Lantus 45 units subcutaneous at bedtime.  5. Hydralazine 25 mg p.o. t.i.d.  6. Neurontin 600 mg p.o. t.i.d.  7. Iron sulfate 325 mg p.o. daily.  8. Vitamin D 50,000 units p.o. q. week.  9. Crestor 10 mg p.o. at bedtime.   ALLERGIES: Doxycycline and seafood.   SOCIAL HISTORY: Negative for alcohol or tobacco abuse.    FAMILY HISTORY: Positive for  bladder cancer and skin cancer.   REVIEW OF SYSTEMS. CONSTITUTIONAL: No fever or change in weight. EYES: No blurred or double vision. Positive for glaucoma. ENT: No tinnitus or hearing loss. No nasal discharge or bleeding. No difficulty swallowing. RESPIRATORY: The patient has had cough but no wheezing. Denies hemoptysis. No painful respiration. CARDIOVASCULAR: No chest pain or palpitations. No syncope. GI: No nausea, vomiting, or diarrhea. No abdominal pain. No change in bowel habits. GU: No dysuria, hematuria, or incontinence. ENDOCRINE: No polyuria or polydipsia. No heat or cold intolerance. HEMATOLOGIC: The patient has a history of anemia. Denies bruising or bleeding. LYMPHATIC: No swollen glands. MUSCULOSKELETAL: The patient denies pain in her neck, back, shoulders, knees, or hips. No gout. NEUROLOGIC: No numbness, although she has generalized weakness. Denies migraines, stroke or seizures. PSYCHIATRIC: The patient denies anxiety, insomnia, or depression.   PHYSICAL EXAMINATION:  GENERAL: The patient is in moderate respiratory distress.   VITAL SIGNS: Vital signs are currently remarkable for a blood pressure of 165/74 with a heart rate of 102 and a respiratory rate of 20. She is afebrile.   HEENT: Normocephalic, atraumatic. Pupils equally round and reactive to light and accommodation. Extraocular movements are intact. Sclerae not icteric. Conjunctivae are clear.  Oropharynx is clear.   NECK: Supple without thyromegaly. Minimal JVD noted.   LUNGS: Lungs revealed rales approximately 1/3 of the way up bilaterally. No wheezes or rhonchi. Some dullness at the bases.   CARDIAC: Rapid rate with a regular rhythm. Normal S1 and S2. No significant rubs or gallops noted.  ABDOMEN: Soft, nontender with normoactive bowel sounds. No organomegaly or masses were appreciated. No hernias or bruits were noted.   EXTREMITIES: Stasis changes with trace edema. Pulses were 2+ bilaterally.   SKIN: Warm and dry  without rash or lesions.   NEUROLOGIC: Cranial nerves II through XII grossly intact. Deep tendon reflexes were symmetric. Motor and sensory exam is nonfocal.   PSYCHIATRIC: Exam revealed a patient who was alert and oriented to person, place, and time. She was cooperative and used good judgment.   LABORATORY DATA: CK was 101 with an MB of 7.9 and a troponin of 2.72. Glucose was 97 with a BUN of 43 and a creatinine of 3.21 with a sodium of 138 and a potassium of 5.3 and a GFR of 15. White count was 9.1 with a hemoglobin of 7.3. Chest x-ray revealed pulmonary edema/congestive heart failure. EKG revealed sinus tachycardia with inferolateral ST segment depression.   ASSESSMENT:  1. Elevated troponin worrisome for ongoing ischemia.  2. Acute on chronic renal failure.  3. Worsening anemia.  4. Congestive heart failure, presumably diastolic.  5. Left kidney mass.  6. Benign hypertension.  7. Insulin-dependent diabetes mellitus.  8. Hyperkalemia.   PLAN: The patient will be admitted to telemetry and started on IV Lasix to attempt diuresis. We will follow her sugars with Accu-Cheks before meals and at bedtime and add sliding scale insulin as needed. We will follow serial cardiac enzymes and obtain an echocardiogram. Follow up chest x-ray in the morning. We will consult Cardiology because of her heart failure and elevated troponin. We will consult Nephrology for her acute on chronic renal failure and potential dialysis. We will consult Urology in regards to the left kidney mass for possible biopsy. Follow up chest x-ray and routine labs in the morning. We will supplement oxygen at this time and wean as tolerated. Further treatment and evaluation will depend upon the patient's progress.   TOTAL TIME SPENT ON ADMISSION: 50 minutes.    ____________________________ Leonie Douglas Doy Hutching, MD jds:vtd D: 12/23/2011 20:01:57 ET T: 12/24/2011 07:32:40 ET JOB#: [id number]  cc: Leonie Douglas. Doy Hutching, MD,  <Dictator> Morton Peters., MD JEFFREY Lennice Sites MD ELECTRONICALLY SIGNED 12/25/2011 23:26

## 2014-11-09 NOTE — Consult Note (Signed)
Pt admitted in April 2013 and found to have signif anemia then with hgb in mid 7 range and had heme neg stools then.  Her last colonoscopy was in 02/2003 internal hemorrhoids only. Last EGD was in 05/2001 and showed mild erythematous gastropathy.  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  Please do stool cards again and check FE/TIBC.  Will consider endoscopic evaluation in future at least 6 weeks from now.  Electronic Signatures: Manya Silvas (MD)  (Signed on 08-Jun-13 15:35)  Authored  Last Updated: 08-Jun-13 15:35 by Manya Silvas (MD)

## 2014-11-09 NOTE — Consult Note (Signed)
PATIENT NAME:  [name], [name] MR#:  [id number] DATE OF BIRTH:  [birth year]  DATE OF CONSULTATION:  11/04/2011  REFERRING PHYSICIAN:  Dr. Bridgette Habermann  CONSULTING PHYSICIAN:  Rafia Shedden Lilian Kapur, MD  REASON FOR CONSULTATION: Acute renal failure.   HISTORY OF PRESENT ILLNESS: The patient is a very pleasant 59 year old Caucasian female with past medical history of insulin-dependent diabetes mellitus, diabetic retinopathy, gastroesophageal reflux disease, hyperlipidemia, glaucoma, legal blindness, history of hysterectomy, history of laser eye surgery, and history of right foot surgery who presented to [facility] with right lower extremity swelling and pain. The patient appears to be a rather poor historian but in reviewing the history and physical that has already been dictated it appears that the patient fell approximately two weeks ago at a [employer]. She hit the inside of her right knee. There was no apparent skin tear at that point in time. She subsequently developed erythema and swelling in the knee. She had an x-ray of her right knee performed on 10/25/2011 which did not show any obvious osseous abnormalities. She was subsequently seen by her primary care physician and was started on pain pill including Vicodin and antibiotics including amoxicillin. She denies ingestion of NSAIDs. She subsequently developed fevers and chills and increasing erythema and pain in the right lower extremity. The patient also had decreased p.o. intake thereafter. The patient was found to have significant acute renal failure. The patient's baseline creatinine is noted as being 1.75 on 04/22/2010. Upon presentation her creatinine was noted as being 4.85. The patient had a urinalysis performed which showed urine protein of 100 mg/dL, 2 RBCs per high-power field were noted, and 2 WBCs per high-power field were noted. She is currently receiving IV fluid hydration. The patient also had a renal ultrasound for  further evaluation which was negative for hydronephrosis, however, there was a hypoechoic mass at the lower pole of the left kidney which is increased in size from prior exam of 06/15/2009. The patient is also notably anemic with a hemoglobin of 8.1 and MCV of 79. Blood culture from admission thus far is negative. The patient also has a low iron saturation of 11%.   PAST MEDICAL HISTORY:  1. Insulin-dependent diabetes mellitus.  2. Diabetic retinopathy.  3. Gastroesophageal reflux disease.  4. Hyperlipidemia.  5. Hypertension.  6. Legal blindness.  7. Hysterectomy.  8. Right foot surgery.  9. Laser eye surgery.   CURRENT INPATIENT MEDICATIONS:  1. Tylenol 650 mg p.o. q.4 hours p.r.n.  2. Amlodipine 10 mg daily. 3. Lumigan 0.01 one drop both eyes at bedtime. 4. Azopt 1% one drop both eyes t.i.d.  5. Lantus insulin 45 units subcutaneous at bedtime.  6. Sliding scale insulin.  7. Labetalol 10 mg IV q.2 hours p.r.n. hypertension.  8. Morphine 2 to 4 mg IV q.4 hours p.r.n. pain.  9. Zofran 4 mg IV q.4 hours p.r.n.  10. Crestor 10 mg p.o. at bedtime. 11. Cephalexin 500 mg q.8 hours.  12. Heparin 5000 units subcutaneous q.12 hours.   ALLERGIES: Doxycycline.   SOCIAL HISTORY: The patient lives in [place]. She is married. She denies tobacco abuse though is exposed to secondhand smoke. Denies alcohol or illicit drug use.   FAMILY HISTORY: Significant for skin cancer as well as bladder cancer.   REVIEW OF SYSTEMS: CONSTITUTIONAL: The patient was having fevers and chills as well as rigors at home. EYES: Legally blind and has history of diabetic retinopathy and glaucoma as well as history of cataracts. HEENT: Denies  headaches, hearing loss. Denies epistaxis, sore throat. PULMONARY: Denies cough, shortness of breath, hemoptysis. CARDIOVASCULAR: Denies chest pain, palpitations, PND, orthopnea. GI: Denies nausea, vomiting but did have some anorexia and poor p.o. intake at home. GU: Denies  frequency, urgency, dysuria. Reports diminished urine output at home. MUSCULOSKELETAL: Reports significant right lower extremity pain after fall. NEUROLOGIC: Denies focal extremity numbness, weakness, or tingling. ENDOCRINE: Denies polyuria, polydipsia, polyphagia. Does have history of longstanding diabetes mellitus. HEMATOLOGIC/LYMPHATIC: Denies easy bruisability, bleeding, or swollen lymph nodes. INTEGUMENTARY: Reports redness of her right lower extremity. PSYCHIATRIC: Denies depression, bipolar disorder. ALLERGY/IMMUNOLOGIC: Denies seasonal allergies or history of immunodeficiency.   PHYSICAL EXAMINATION:   VITAL SIGNS: Temperature 99.9, pulse 107, respirations 18, blood pressure 171/79, pulse oximetry 91% on room air.   GENERAL: Well developed, well nourished, obese Caucasian female currently in no acute distress.   HEENT: Normocephalic, atraumatic. Extraocular movements are intact. Pupils are equal, round, and reactive to light. No scleral icterus. Conjunctivae are pink. No epistaxis noted. Gross hearing intact. Oral mucosa moist. Tongue is red. The patient states that she just ate Jell-O.   NECK: Supple without JVD or lymphadenopathy.   LUNGS: Clear to auscultation bilaterally with normal respiratory effort.   CARDIOVASCULAR: S1, S2 regular rate and rhythm. No murmurs, rubs, or gallops appreciated.   ABDOMEN: Obese, soft, nontender, nondistended. Bowel sounds positive. No rebound or guarding. No gross organomegaly appreciated.   EXTREMITIES: No edema in the left lower extremity. There is significant edema in the right lower extremity. There is significant erythema of the right lower extremity as well. There is pain to palpation of the right lower extremity.   NEUROLOGIC: The patient is alert and oriented to time, person, and place. Strength is 5 out of 5 in both upper and lower extremities.   MUSCULOSKELETAL: As above, there is significant erythema, swelling, and redness of the right  lower extremity extending from her ankle up to her right knee.   SKIN: Warm and dry. Erythema of the right lower extremity is noted as above.   PSYCHIATRIC: The patient is with appropriate affect and appears to have good insight into her current illness.   GU: No suprapubic tenderness is noted at this time.   LABORATORY STUDIES: Right lower extremity duplex no DVT in the right lower extremity. There is observed cystic area posterior compatible with Baker's cyst, however.   Renal ultrasound shows no hydronephrosis. There is a left lower pole mass which appears to be vascular in nature.   CBC shows WBC 15.2, hemoglobin 8.1, hematocrit 25, platelets 321, MCV 79. Magnesium 2.4. Hemoglobin A1c 9.1. Lipid profile shows total cholesterol 77, triglycerides 183, HDL 15, LDL 25.   Right knee x-ray shows no acute bony abnormalities.   Blood cultures thus far negative. Iron saturation 11%, ferritin of 211.   IMPRESSION: This is a 59 year old Caucasian female with past medical history of diabetes mellitus, chronic kidney disease stage III with baseline creatinine 1.7, gastroesophageal reflux disease, hyperlipidemia, hypertension, glaucoma, legal blindness, diabetic retinopathy, hysterectomy, right foot surgery, and laser eye surgery who presented to [facility] with right lower extremity cellulitis.  1. Acute renal failure/chronic kidney disease, stage III. The patient's baseline creatinine is noted as being 1.7. Urinalysis shows that she has some proteinuria. No appreciable hematuria or pyuria. I suspect that she has underlying diabetic nephropathy, however, she appears to have acute renal failure now most likely due to acute tubular necrosis as the patient was not eating very well prior to admission.  The patient was on amoxicillin which could potentially cause allergic interstitial nephritis, however, this is less likely a possibility at this point in time. I agree with IV fluid  hydration for now as well as monitoring of urine output. We will proceed with SPEP, UPEP, screen for secondary hyperparathyroidism.  2. Iron deficiency anemia. This could potentially be related to her underlying chronic kidney disease, however, the patient does appear to have quite significant iron deficiency. Iron saturation was low at 11%. The patient had a colonoscopy in 2004 per records here at [facility]. We would recommend repeat colonoscopy if not done recently. I will check SPEP and UPEP as above. We would recommend starting the patient on ferrous sulfate, however, will hold off given the acute infection.  3. Right lower extremity cellulitis. This was found to be quite significant in nature. She was switched to cephalexin 500 mg p.o. q.8 hours. Progression of the cellulitis will need to be followed. Dr. Clayborn Bigness has been consulted and is following.  4. Left lower pole renal mass. This appears to be vascular in nature. We will obtain CT scan of the abdomen and pelvis to further investigate this as suggested by Radiology. Further plan to be determined once imaging is back.   I would like to thank Dr. Bridgette Habermann for this kind referral. Further plan as the patient progresses.   ____________________________ Tama High, MD mnl:drc D: 11/04/2011 19:06:00 ET T: 11/05/2011 08:00:05 ET JOB#: [id number]  cc: Tama High, MD, <Dictator> Mariah Milling Nadine Ryle MD ELECTRONICALLY SIGNED 11/21/2011 21:12

## 2014-11-09 NOTE — Consult Note (Signed)
PATIENT NAME:  [name], [name] MR#:  [id number] DATE OF BIRTH:  [birth year]  DATE OF CONSULTATION:  12/24/2011  REFERRING PHYSICIAN:   CONSULTING PHYSICIAN:  [name] David, MD  INDICATION FOR CONSULTATION: Shortness of breath and congestive heart failure.   HISTORY OF PRESENT ILLNESS: This is a 59 year old white female with a past medical history of insulin-dependent diabetes, hyperlipidemia, and hypertension who has been seen by nephrology and urology because of renal mass and worsening renal function. She came in because she was short of breath with PND, orthopnea, and left-sided chest pain. The chest pain was pressure-type.   PAST MEDICAL HISTORY:  1. History of chronic renal failure.  2. Renal mass. 3. Benign hypertension.  4. Hyperlipidemia.  5. History of glaucoma and blindness. 6. Hypertensive heart disease. 7. Anemia.   ALLERGIES: Doxycycline and seafood.   SOCIAL HISTORY: She denies EtOH abuse or smoking.   FAMILY HISTORY: Positive for coronary artery disease.   PHYSICAL EXAMINATION:   GENERAL: She is alert and oriented x3.  VITALS: Blood pressure is 170/80, pulse 93, temperature 97.4, and saturation 94%.   NECK: No JVD.   LUNGS: Clear.   HEART: Regular rate and rhythm. Normal S1 and S2. No audible murmur.   ABDOMEN: Soft and nontender, positive bowel sounds.   EXTREMITIES: No pedal edema.   LABS/STUDIES: BUN 38, creatinine 3.08. Her troponin was 2.72 when she came and now is 2.32. Hemoglobin and hematocrit is 8.6/26.9.   EKG shows sinus tachycardia at 107 beats per minute with ST depression in inferolateral leads suggestive of ischemia.   ASSESSMENT AND PLAN: The patient has mildly elevated troponin with EKG changes and chest pain. She probably should get cardiac catheterization. However, given the fact that she has renal insufficiency, we will have to have discussion with renal about dialysis prior to catheterization. In the meantime, we will maximize medical  therapy. She has 3 out of 3 risk factors for non-STEMI. Even though this elevated troponin can be due to renal insufficiency, with EKG changes and some chest tightness, this may be a non-STEMI. We will maximize medical therapy and have renal decide whether she needs to be on dialysis before considering catheterization. In the meantime, we will treat with medication aggressively. ____________________________ [name] David, MD sak:slb D: 12/24/2011 11:52:28 ET T: 12/24/2011 12:20:18 ET JOB#: [id number]  cc: [name] David, MD, <Dictator> [name] David MD ELECTRONICALLY SIGNED 12/26/2011 15:48

## 2014-11-09 NOTE — Consult Note (Signed)
Chief Complaint:   Subjective/Chief Complaint Cellulitis right leg--  less pain and swelling than 2 days ago.  Reddness has receded somewhat.  range of motion of knee satisfactory. no increase in swelling of knee.  continue present treatment.   Electronic Signatures: Park Breed (MD)  (Signed 20-Apr-13 15:25)  Authored: Chief Complaint   Last Updated: 20-Apr-13 15:25 by Park Breed (MD)

## 2014-11-09 NOTE — Consult Note (Signed)
Pt hgb up to 10 after transfusion.  No abd pain. no bleeding.  I will sign off, should get hemocults done with primary doctor, Dr. Hardin Negus. No plans for EGD or colonoscopy given recent MI/CHF.  Electronic Signatures: Manya Silvas (MD)  (Signed on 09-Jun-13 10:43)  Authored  Last Updated: 09-Jun-13 10:43 by Manya Silvas (MD)

## 2014-11-09 NOTE — Consult Note (Signed)
PATIENT NAME:  [name], [name] MR#:  [id number] DATE OF BIRTH:  12-09-1955  DATE OF CONSULTATION:  12/24/2011  REFERRING PHYSICIAN:   CONSULTING PHYSICIAN:  Manya Silvas, MD  ADDENDUM   Exam of her chest shows a few inspiratory crackles at the right base. The left is clear. Overall, the chest is much clearer than presentation at admission because of the treatment of her congestive heart failure.    ____________________________ Manya Silvas, MD rte:ap D: 12/24/2011 15:51:06 ET T: 12/24/2011 16:08:11 ET JOB#: [id number]  cc: Manya Silvas, MD, <Dictator>  Manya Silvas MD ELECTRONICALLY SIGNED 01/10/2012 16:05

## 2014-11-09 NOTE — Consult Note (Signed)
Chief Complaint:   Subjective/Chief Complaint Cellulitis right leg--  reddness and swelling gradually improving.  Still painful to touch and move.  circulation/sensation/motor function good.  Rec:  moist heat/ K pad.  Remains afebrile.   VITAL SIGNS/ANCILLARY NOTES: **Vital Signs.:   22-Apr-13 09:08   Vital Signs Type Q 4hr   Temperature Temperature (F) 98.2   Celsius 36.7   Temperature Source oral   Pulse Pulse 87   Pulse source per Dinamap   Respirations Respirations 20   Systolic BP Systolic BP 833   Diastolic BP (mmHg) Diastolic BP (mmHg) 73   Mean BP 102   BP Source Dinamap   Pulse Ox % Pulse Ox % 96   Pulse Ox Activity Level  At rest   Oxygen Delivery Room Air/ 21 %   Electronic Signatures: Park Breed (MD)  (Signed 22-Apr-13 13:47)  Authored: Chief Complaint, VITAL SIGNS/ANCILLARY NOTES   Last Updated: 22-Apr-13 13:47 by Park Breed (MD)

## 2014-11-09 NOTE — Consult Note (Signed)
Brief Consult Note: Diagnosis: 3cm left renal mass.   Patient was seen by consultant.   Consult note dictated.   Recommend further assessment or treatment.   Orders entered.   Discussed with Attending MD.   Comments: 3cm left renal mass increased from 1.5cm on U/S from 2010.  Images reviewed.  Reccomend Contrasted Renal CT vs. Contrasted MRI as outpatient once Cellulitis/ARF issues resolved.  Reviewed likely options of continued observation, ablation, or partial nephrectomy with patient.  Should f/u with Dr. [name] as an outpatient for this discussion.  Electronic Signatures: Felicity Coyer (MD)  (Signed 20-Apr-13 14:16)  Authored: Brief Consult Note   Last Updated: 20-Apr-13 14:16 by Felicity Coyer (MD)

## 2014-11-09 NOTE — Consult Note (Signed)
PATIENT NAME:  [name], [name] MR#:  [id number] DATE OF BIRTH:  07-Dec-1955  DATE OF CONSULTATION:  12/24/2011  REFERRING PHYSICIAN:   CONSULTING PHYSICIAN:  Manya Silvas, MD  HISTORY OF PRESENT ILLNESS: The patient is a 59 year old white female with history of significant medical problems in the past, insulin-dependent diabetes, chronic renal failure, hypertensive cardiovascular disease, and hypertension. She was [place] the hospital [place] congestive heart [place] and was found to have anemia. She had been here in April for skin infections and cellulitis of the legs and was found to be anemic with heme-negative Hemoccult cards.   Review of her past history indicates she had a colonoscopy in 2004 and an upper endoscopy in 2002 both showing minimal findings including internal hemorrhoids and mild erythematous gastritis.   REVIEW OF SYSTEMS: The patient denies any rectal bleeding. She has been on iron for a couple of months and this of course has made her stools dark. She says her appetite is good and sometimes when she swallows bread or french fries they do not seem to want to go down very well. She does complain of constipation. She is on a stool softener for this. There has been no abrupt change in bowel habits.  FAMILY HISTORY: Negative for colon cancer, negative for esophageal or stomach cancer.   PAST MEDICAL HISTORY:  1. Cellulitis in April 2013 requiring admission.  2. Chronic renal failure. Her creatinine on that admission was over 4.  3. Left kidney mass.  4. Insulin-dependent diabetes for greater than 25 years. 5. Hypertension for greater than 25 years. 6. Glaucoma.  7. Hyperlipidemia.  8. Anemia of chronic disease.  9. Gastroesophageal reflux disease. 10. Previous hysterectomy.  11. Previous right foot surgery.   MEDICATIONS ON ADMISSION:  1. Oxycodone 5 mg every four hours p.r.n. for pain. 2. Prilosec 20 mg a day.  3. Norvasc 10 mg a day.  4. Lantus insulin 45  units subcutaneous at bedtime.  5. Hydralazine 25 mg three times daily. 6. Neurontin 600 mg three times daily. 7. Iron sulfate 325 mg a day.  8. Vitamin D 50,000 units once a week.  9. Crestor 10 mg a day.   ALLERGIES: Seafood and doxycycline.   SOCIAL HISTORY: Does not smoke and does not drink.   FAMILY HISTORY: Positive for bladder cancer.   PHYSICAL EXAMINATION:   GENERAL: White female who looks older than stated age.   VITAL SIGNS: Temperature 98.5, pulse 95, respirations 18, blood pressure 174/84, and pulse oximetry 95%.   HEENT: Sclerae anicteric. Conjunctivae slightly pale. Head is atraumatic.   CHEST: Clear.   HEART: 1/6 systolic murmur.   ABDOMEN: Mild obesity, nontender. No hepatosplenomegaly   LAB DATA: Glucose 64, BUN 38, creatinine 3.1, sodium 143, potassium 4.9, chloride 114, and CO2 19. Troponin today 2.5. Total protein 7.7, albumin 2.6, total bilirubin 0.3, alkaline phosphatase 153, SGOT 20, and SGPT 39. White count 8.2 and hemoglobin 8.6 (this is after 2 units of blood and initial hemoglobin was 7.3) and platelet count 250. O-positive blood with negative antibody screen   ASSESSMENT: Anemia of chronic disease.   RECOMMENDATIONS: Stool Hemoccult cards. Check iron binding. Follow-up as needed. Because of her probable heart attack and her severe congestive heart failure, do not recommend any endoscopic procedures for at least six weeks, unless emergency.  ____________________________ Manya Silvas, MD rte:slb D: 12/24/2011 15:43:17 ET T: 12/24/2011 16:06:50 ET JOB#: [id number]  cc: Manya Silvas, MD, <Dictator> Morton Peters., MD  Manya Silvas MD ELECTRONICALLY SIGNED 01/10/2012 16:06

## 2014-11-09 NOTE — Consult Note (Signed)
PATIENT NAME:  [name], [name] MR#:  [id number] DATE OF BIRTH:  09-27-1955  DATE OF CONSULTATION:  11/05/2011  REFERRING PHYSICIAN:  Dr. Leslye Peer CONSULTING PHYSICIAN:  Juliann Mule. Valma Cava II, MD  REASON FOR CONSULTATION:  Left renal mass.    HISTORY OF PRESENT ILLNESS:  The patient is a 59 year old Caucasian female who was admitted secondary to cellulitis. She has a past history consistent with insulin-dependent diabetes, hypertension, chronic kidney disease, and anemia. She fell several days ago at [facility] and now presents with cellulitis. She has been admitted. She does have renal failure and subsequently had a CT scan that showed a left 3-cm lower pole renal mass. 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 Compared to a prior ultrasound 2010, this mass had increased from 1.5 cm to 3 cm. She denies any pain associated with this, any hematuria, any flank symptoms. She, however, related to her cellulitis had fevers, increased swelling, pain, and redness of her right lower extremity. Subsequently she has had decreased p.o. intake and urine output. She was admitted to the hospitalist service for management.   PAST MEDICAL HISTORY:  1. Insulin-dependent diabetes mellitus.  2. Gastroesophageal reflux disease. 3. Hyperlipidemia.  4. Cardiovascular disease. 5. Glaucoma. 6. Legal blindness. 7. History of hysterectomy. 8. History of right foot surgery.   OUTPATIENT MEDICATIONS: Up to date and reviewed in depth in the chart.   ALLERGIES: Doxycycline and msg.  FAMILY HISTORY: Brother with bladder cancer.  SOCIAL HISTORY: No tobacco, although she says, "I have lived with smoke all my life."  REVIEW OF SYSTEMS: Fevers and chills upon admission. No flank pain. The balance of 11 systems is otherwise negative.   PHYSICAL EXAM:  VITAL SIGNS:  The patient is currently at 36.8, heart rate 100, respiratory rate 18, blood pressure 183/83, sating 95%.  GENERAL: Alert and oriented times three, conversational.   HEENT:  Normocephalic, atraumatic, anicteric.   NECK:  Supple. No tenderness.  CARDIOVASCULAR: Tachycardic.   LUNGS: Clear anteriorly.  ABDOMEN:  Soft, nontender, nondistended.   EXTREMITIES:  Her left lower extremity is normal. Her right lower extremity has significant erythema, edema, and tenderness arising from the marked area above her right knee, which is demarcated.   SKIN: Otherwise negative.   NEUROLOGIC: Grossly intact.  PSYCH: Appropriate.   LABORATORY VALUES: Most recent chemistries as follows: Creatinine 4.47, estimated GFR 10.  This is essentially stable since admission. Her white count was 16, down from 20 on admission.   IMAGING:  CT scan reviewed by me and detailed in the report shows a left lower pole exophytic 3-cm renal mass. Cannot rule out a neoplastic etiology versus hyperdense cyst at this time.   ASSESSMENT: This is a 59 year old female with multiple medical problems currently admitted for cellulitis and acute on chronic renal failure. Her 3-cm left lower pole exophytic renal mass has likely no bearing on her current clinical state; however, it has doubled in size since renal ultrasound in 2010.   RECOMMENDATIONS:  1. Continue current medical inpatient course. Further evaluation of her renal mass should follow after her recovery from her current illness. 2. Ideally the patient would receive a contrasted base CT scan versus a contrasted base MRI in the future once her acute renal failure resolves. However, if the patient does not achieve adequate GFR for contrast-enhanced imaging, MRI may be useful in helping to delineate whether this is a solid suspicious lesion or not.  3. If indeed this is a 3-cm exophytic mass, I did counsel the patient regarding  continued observation versus ablation versus partial radical nephrectomy, and she should follow up with Dr. [name] as an outpatient for this discussion once she is adequately imaged and at her baseline level of health. This was  discussed with the primary team.      ____________________________ Juliann Mule. Nobie Putnam, MD erh:bjt D: 11/05/2011 16:47:55 ET T: 11/06/2011 08:15:17 ET JOB#: [id number]  (Entered as 02)  cc: Juliann Mule. Nobie Putnam, MD, <Dictator> Carroll Sage MD ELECTRONICALLY SIGNED 11/06/2011 9:33

## 2014-11-09 NOTE — Consult Note (Signed)
Brief Consult Note: Diagnosis: Cellulitis right leg.   Patient was seen by consultant.   Recommend further assessment or treatment.   Discussed with Attending MD.   Comments: 59 year old diabetic female fell about 2 weeks ago injuring the right knee/leg.  Had X-rays which were negative.  Has developed reddness and swelling of leg and knee and has had fever and chills.  Saw Dr Hardin Negus and took amoxacillin for 6 days but got worse.  Presents for care in Emergency Room today.   Exam:  Alert, oriented, and cooperative.  circulation/sensation/motor function good right leg.  cellulitis present along medial side of leg from just above knee to just above ankle. Slight amount of fluid in knee possibly but range of motion is fairly good and I believe this is smpathetic in nature.  Will watch closely.    Rx:  IV antibiotics per [facility]        Will observe response and aspirate joint is needed later.          Ice prn;Marland Kitchen  Electronic Signatures: Park Breed (MD)  (Signed 18-Apr-13 17:46)  Authored: Brief Consult Note   Last Updated: 18-Apr-13 17:46 by Park Breed (MD)

## 2014-11-09 NOTE — Consult Note (Signed)
Brief Urology Consultation Report for Consultation: Left Renal Mass (indeterminate) MD: Darcella Cheshire, M.D.Local Urologist: Alcus Dad, M.D. MD: Leonie Douglas. [place], M.D.Reed Breech., M.D.  Complex Left Renal Cystic Masses (x2), enlargingrecent imaging study on 12/17/2011: MRI (without contrast): Complex LLP Cystic Masses x2 (2.2cm laterally, exophytic and 1.3cm medially, intraparenchymally) -positive vascular flow on doppler RUS 11/04/2011 with the complex cystic mass (hypoechoic with internal echoes) measuring 2.85cm  -the cyst measured 1.49cm on Abd [place] 11/29/20103indication for acute intervention during the current hospitalization for exacerbation of CHF with ?MI, anemia, and acute on chronic renal insufficiency -d/w pt, the differential diagnoses of complex cystic renal masses with the options:  1. Continued serial imaging  2. Percutaneous biopsy with either Cryoablation or RFA (Radiofrequency Ablation)  3. Surgical Removal - ? laparoscopic complete nephrectomy, if the patient will be going on Hemodialysis, vs partial nephrectomy  No indication for acute intervention during the current hospitalizationMaintain follow up as an outpatient with Dr. Alcus Dad of [facility]   Electronic Signatures: Darcella Cheshire (MD) (Signed on 08-Jun-13 16:47)  Authored   Last Updated: 08-Jun-13 16:48 by Darcella Cheshire (MD)

## 2014-11-09 NOTE — Discharge Summary (Signed)
NAME:  [name], KNEE MR#:  [id number] DATE OF BIRTH:  1956/02/17  DATE OF ADMISSION:  11/03/2011 DATE OF DISCHARGE:  11/09/2011  PRIMARY CARE PHYSICIAN: Laurian Brim, MD   NEPHROLOGISTS: Dr. Holley Raring and Dr. Candiss Norse   FINAL DIAGNOSES:  1. Systemic inflammatory response syndrome with cellulitis of the right leg with leukocytosis that is persistent.  2. Acute renal failure on chronic kidney disease.  3. Kidney mass on noncontrasted CT scan.  4. Diabetes.  5. Hypertension.  6. Glaucoma.  7. Hyperlipidemia.  8. Constipation.  9. Nausea.  10. Anemia.  11. Vitamin D deficiency.  12. Weakness.   MEDICATIONS ON DISCHARGE:  1. Gabapentin 600 mg 3 times a day. 2. Crestor 10 mg daily.  3. Omeprazole 20 mg daily.  4. Norvasc 10 mg daily.  5. Combigan 0.2 to 0.5 ophthalmic solution one drop each eye twice a day.  6. Azopt 1% ophthalmic suspension one drop to affected eye twice a day. 7. Fish Oil 1,000 mg 1 capsule daily.  8. Calcium 1 tablet daily.  9. Super B Complex daily.  10. Calcium, magnesium, and zinc 1 capsule daily.  11. Lumigan one drop to affected eye at bedtime.  12. Lantus 45 units subcutaneous injection at night.  13. Colace 100 mg twice a day.  14. Ferrous sulfate 325 mg daily.  15. Hydralazine 25 mg 3 times a day.  16. Keflex 500 mg 1 capsule 3 times a day until complete.  17. Oxycodone 1 tablet every 4 to 6 hours as needed for pain.  18. Vitamin D 50,000 units weekly.   DO NOT TAKE: 1. Glipizide.   2. Lisinopril/hydrochlorothiazide. 3. Meloxicam.   4. Ramipril. 5. K-Lor.   ACTIVITY: As tolerated.   DIET: Low sodium, 1800 ADA diet.   FOLLOW-UP:  1. Follow-up with either Dr. Holley Raring or Dr. Candiss Norse in one week. 2. Follow-up with Dr. [name] in 2 to 3 weeks. 3. Follow-up with Dr. Laurian Brim in 1 to 2 weeks.   REASON FOR ADMISSION: The patient was admitted 11/03/2011 and discharged 11/09/2011. The patient came in with right leg swelling and pain.     [place] COURSE:  1. Dr. Earnestine Leys, Orthopedics 2.  Dr. Clayborn Bigness, Infectious Disease  3. Dr. Holley Raring and Dr. Candiss Norse, Nephrology  4. Dr. Valma Cava, Urology  5. Physical therapy   HISTORY OF PRESENT ILLNESS: The patient is a 59 year old female with diabetes, hypertension, chronic disease, and anemia. The patient came in with right knee pain, swelling, and erythema, had decreased urine output. The patient was admitted to the hospital and placed on Zosyn and Zyvox. Because of the acute renal failure, NSAIDs, ACE inhibitor, and hydrochlorothiazide were held. Orthopedic consultation was obtained, Nephrology consultation was obtained, and ID consultation was obtained.   LABORATORY AND RADIOLOGICAL DATA DURING THE HOSPITAL COURSE: Blood culture negative for five days. Urinalysis 1+ blood. Ferritin 211. Iron saturation 11, iron serum 28, TIBC 258. Glucose 253, BUN 64, creatinine 4.85, sodium 133, potassium 4.3, chloride 102, CO2 16, calcium 8.9, alkaline phosphatase 201, ALT 48, AST 38, albumin 2.1, GFR 9, white blood cell count 20.5, hemoglobin and hematocrit 7.7 and 25.2, platelet count 360.   Right knee x-ray showed no acute bony abnormalities.   LDL 25, HDL 15, triglycerides 183. Hemoglobin A1c 9.1. Magnesium 2.4.   Ultrasound of the kidneys bilaterally showed no hydronephrosis, markedly hypoechoic mass on the lower pole of the left kidney increased in size since the prior exam on 06/15/2009. On this  exam shows internal vascular flow consistent with an enlarging vascular left renal mass.   Ultrasound of the right lower extremity showed no DVT.   CT scan of the abdomen and pelvis without contrast showed findings consistent with a mass of the lower pole of the left kidney, mild enlargement of retroperitoneal lymph node and right groin.   Vitamin D 9.8. Protein electrophoresis albumin low at 2.0, alpha globulin high at 0.6, alpha 2 globulin high at 1.5. PTH 153. ANA negative.  Protein electrophoresis high at 268. Occult blood was negative. On the 21st creatinine was 3.66. Urine creatinine 51.4. Random urine protein 297. Protein creatinine ratio 5778. Creatinine on the 22nd was 3.24. Creatinine on the 23rd was 2.81. Creatinine on the 24th 2.71. Hemoglobin 7.9. White blood cell count 17.0.   HOSPITAL COURSE PER PROBLEM LIST:  1. For the systemic inflammatory response syndrome with cellulitis of the right leg and knee, the patient was started initially on Zyvox and Zosyn. Dr. Clayborn Bigness saw the patient and put the patient on p.o. Keflex right from the second day of hospitalization. The patient was seen in consultation by Orthopedics who did not feel that the knee needed to be tapped. Cellulitis had improved tremendously during the hospital course. The patient was given a total course of 14 days. The patient did have a persistent leukocytosis with white count being 17.0. Dr. Clayborn Bigness was okay with discharge and follow-up in a week for a repeat check on white count.  2. For the acute renal failure on chronic kidney disease, looking back at creatinine back in 2011 it was 1.75. The patient was admitted with a creatinine in the 4 range. The patient was given IV fluids during the hospital stay. Lisinopril/hydrochlorothiazide was stopped. Meloxicam was stopped. Ramipril was stopped. Creatinine slowly improved during the hospital course. Creatinine only improved to 2.71 upon discharge. Close clinical follow-up with the nephrologist as outpatient will continue to be needed.  3. Kidney mass on noncontrasted CT scan. As per the radiologist's reading of the mass, it has increased in size. The patient was seen by Dr. Valma Cava, covering urologist, who recommended follow-up with Dr. [name] as outpatient. He did recommend a contrasted imaging study but at this point with the creatinine being elevated unable to get that at this point.  4. For the patient's diabetes, the patient is going to be discharged home  on Lantus. Glipizide stopped.  5. Hypertension. Because of the creatinine being elevated, a lot of the medications for blood pressure were stopped because they affect the kidney. The patient was sent home on Norvasc and hydralazine. Further titration as outpatient needed.  6. For glaucoma, her usual drops were continued.  7. Hyperlipidemia. Crestor was continued.  8. Constipation. She is on Colace.  9. Nausea. The patient had nausea throughout the hospital stay most of the time probably secondary to infection. That had improved at the time of discharge.  10. Anemia. The patient was guaiac-negative in the hospital course. May need further work-up as outpatient. Wondering if this could be secondary to a kidney mass. The patient was given iron upon discharge, Venofer here in the hospital and Procrit here in the hospital.  11. For the Vitamin D deficiency, Vitamin D supplementation was given.  12. For the weakness, I think it is more that she had pain on stepping down on her leg and was unable to ambulate. The patient did not want to go to rehab and was discharged home with home physical therapy.   TIME  SPENT ON DISCHARGE: 35 minutes.   ____________________________ Tana Conch. Leslye Peer, MD rjw:[id number] D: 11/09/2011 15:43:28 ET T: 11/09/2011 16:24:04 ET JOB#: [id number]  cc: Tana Conch. Leslye Peer, MD, <Dictator> Morton Peters., MD [place] Lilian Kapur, MD Murlean Iba, MD Alcus Dad, MD Marisue Brooklyn MD ELECTRONICALLY SIGNED 11/10/2011 17:32

## 2014-11-09 NOTE — Consult Note (Signed)
PATIENT NAME:  [name], [name] MR#:  [id number] DATE OF BIRTH:  14-Jan-1956  DATE OF CONSULTATION:  12/24/2011  REFERRING PHYSICIAN:  Leonie Douglas. Doy Hutching, MD CONSULTING PHYSICIAN:  Darcella Cheshire, MD  PRIMARY CARE PHYSICIAN: Reed Breech., MD  ESTABLISHED LOCAL UROLOGIST: Alcus Dad, MD  REASON FOR CONSULTATION: Indeterminate left renal mass.   HISTORY OF PRESENT ILLNESS: The patient is a 59 year old white female who was admitted on 12/23/2011 for exacerbation of congestive heart failure with worsening anemia and acute on chronic renal insufficiency. The patient was also noted to have an elevated troponin and may have possibly experienced a mild myocardial infarction. Urology is being consulted for continued evaluation of the patient's known indeterminate left renal mass.   Labs on admission, 12/23/2011, with white blood cell count 9.1 thousand, hematocrit 24.2, platelet count 276,000, and creatinine 3.21. This compares with a creatinine of 2.71 on 11/09/2011. Troponin level 2.72.   Urologic history - on 06/15/2009 abdominal ultrasound revealed a 1.49 cm left renal cyst. Renal ultrasound on 11/04/2011 revealed a 2.85 cm left lower pole hypoechoic mass with internal echoes and vascular flow by Doppler. CT scan of the abdomen without contrast revealed a lobulated contour of the left lower pole of the kidney, but with normal parenchymal attenuation. MRI without contrast of the abdomen on 12/17/2011 revealed two complex cystic masses in the lower pole of the left kidney with the larger one measuring 2.2 cm in a lateral exophytic position and the smaller one measuring 1.3 cm in the medial aspect in the parenchyma of the left lower pole of the left kidney. Both solid and cystic components were felt to be identified on the MRI. The patient denies any flank or abdominal pain. The patient also denies any new bony pain, except for pain in the right hip status post a fall three weeks ago. The patient also  denies any chronic cough or hemoptysis or weight loss.   PAST MEDICAL HISTORY: Reviewed and is as per the admission history and physical by Dr. [name] Reek.   FAMILY HISTORY/SOCIAL HISTORY: Reviewed and as listed in the history and physical by Dr. [name] Reek on admission.   MEDICATIONS: The patient's medications are also reviewed as listed in the history and physical by Dr. [name] Reek on admission.   DRUG ALLERGIES: The patient reports a rash with doxycycline and migraines with seafood.   REVIEW OF SYSTEMS: Also per documented by Dr. [name] Reek in the admission history and physical with special emphasis as per the history of present illness. In addition, the patient denies any gross hematuria.   PHYSICAL EXAMINATION:   VITALS: As of 3:45 p.m. on 12/24/2011, temperature 98.9 degrees Fahrenheit, pulse 93 and regular, respirations 18 and unlabored, blood pressure 161/81, and pulse oximetry 96% on room air.   GENERAL: Well-developed, well-nourished white female in no apparent distress.   HEENT: Normocephalic and atraumatic. Anicteric. Extraocular movements intact.   NECK: No masses or bruits.   PULMONARY: Basilar rales bilaterally with normal respiratory effort.   CARDIAC: Regular rate and rhythm without murmurs, gallops, or rubs. 1+ radial pulses bilaterally.   ABDOMEN: Soft, nontender, and nondistended. No palpable masses. No costovertebral angle tenderness.   EXTREMITIES: Brawny discoloration of the right lower leg with pitting edema to the knee that was mildly tender.   SKIN: No rashes or lesions about the head and neck.   NEUROLOGIC: Nonfocal.   PSYCHIATRIC: Alert and oriented x4, pleasant and cooperative.  LABORATORY/RADIOLOGIC DATA: As per the history  of present illness.   ASSESSMENT: Indeterminate complex left renal cystic masses x2 (enlargement of the left lateral lower pole renal mass). The most recent imaging study (MRI) on 12/17/2011 revealed two complex  cystic renal masses in the lower pole of the left kidney with the largest one measuring 2.2 cm in the lateral position and exophytic upon my review. The smaller mass measures 1.3 cm and is medial in the left lower pole and intraparenchymal. Vascular flow to the larger mass was identified on ultrasonography, on 11/04/2011, with the mass being hypoechoic with internal echoes measuring 2.85 cm. The cyst measured 1.49 cm on abdominal ultrasonography in November 2010. The differential diagnosis of complex cystic renal masses was discussed at length with the patient along with the options for renal masses measuring less than 2.5 cm including continued serial imaging versus percutaneous biopsy with either cryoblation or radiofrequency ablation versus surgical removal. The patient commented that she was told that she would need dialysis. If the patient is felt to be in end-stage renal disease and is due to go on dialysis, laparoscopic complete nephrectomy may be considered. However, if the patient is not yet at end-stage with her renal disease then partial nephrectomy may be considered as well, either open versus laparoscopic. There is no current indication, however, for acute intervention during the patient's current hospitalization.   RECOMMENDATIONS:  1. No indication for acute intervention during the current hospitalization.  2. Maintain follow-up as an outpatient with her established local urologist, Dr. Alcus Dad, of [facility], to develop a long-term plan with regard to her complex left renal cystic masses. ____________________________ Darcella Cheshire, MD jhk:[id number] D: 12/24/2011 17:02:22 ET     T: 12/25/2011 15:25:09 ET        JOB#: [id number] cc: Darcella Cheshire, MD, <Dictator> Darcella Cheshire MD ELECTRONICALLY SIGNED 01/08/2012 12:57

## 2014-11-09 NOTE — Discharge Summary (Signed)
PATIENT NAME:  [name], [name] MR#:  [id number] DATE OF BIRTH:  Jan 11, 1956  DATE OF ADMISSION:  12/23/2011 DATE OF DISCHARGE:  12/26/2011  DISCHARGE DIAGNOSES:  1. Acute pulmonary edema.  2. Acute on chronic anemia.  3. Acute on chronic kidney disease stage III.  4. Left kidney mass.  5. Hypertension.  6. Hyperlipidemia.  7. Anemia of chronic disease.  8. Vitamin D deficiency.  9. Gastroesophageal reflux disease.   DISCHARGE MEDICATIONS:  1. Oxycodone 5 mg every four hours p.r.n.  2. Prilosec 20 mg p.o. daily.  3. Norvasc 10 mg p.o. daily.  4. Lantus 45 units at bedtime. 5. Hydralazine 50 mg q.i.d.  6. Neurontin 600 mg p.o. t.i.d. 7. Iron sulfate 325 mg p.o. daily.  8. Vitamin D 50,000 every week. 9. Crestor 10 mg p.o. daily.  10. Azopt eyedrops. 11. Fish oil 1 gram p.o. daily.  12. Vitamin B complex.  13. Lumigan eyedrops into the affected eye once a day.  14. She also is on Keflex for leg cellulitis 500 mg p.o. t.i.d. She needs to finish this. New medicines:  15. Imdur 30 mg p.o. daily, extended release.  16. KCL 10 mEq p.o. daily while she is on Lasix.  17. Lasix 40 mg p.o. b.i.d.  18. Metoprolol 50 mg every 12 hours.   DIET: Low sodium, ADA renal diet.  FOLLOWUP:  1. Follow up with Dr. [name] with urology associates as scheduled.  2. Follow up with Dr. Juleen China, her kidney doctor, in one week. 3. Follow up with Dr. Neoma Laming on Tuesday, tomorrow. 4. Follow up with family doctor in two weeks.     CONSULTATION:  1. GI consult with Dr. Vira Agar. 2. Cardiology consult with Dr. Neoma Laming. 3. Nephrology consult with Dr. Anthonette Legato. 4. Urology consult with Dr. Maudie Mercury.   HOSPITAL COURSE:  1. Acute diastolic heart failure and acute on chronic anemia: The patient is a 59 year old female with history of diabetes, hyperlipidemia, and hypertension. The patient had trouble breathing. The patient was found to have florid congestive heart failure. When she came in her chest  x-ray showed pulmonary edema with congestive heart failure and also EKG showed sinus tachycardia with ST depressions. She was admitted to the hospitalist service for diastolic heart failure exacerbation. She was started on IV Lasix and cardiology consult was obtained because the patient's troponin initially was 2.72. EKG showed sinus tachycardia with ST depression in anterolateral leads. The patient's second troponin dropped to 2.58 with CPK-MB slightly elevated at 6.3.  Dr. Humphrey Rolls saw the patient. The patient had an echocardiogram showing ejection fraction of more than 55% with no wall motion abnormality. The patient has elevated right ventricular pressure of 40 to 50 millimeters. Dr. Humphrey Rolls suggested having a LexiScan stress test done as an outpatient and added Imdur and Plavix, but because of her anemia requiring transfusion I did not give her Plavix to go home with. She is advised to continue metoprolol, and Imdur and see Dr. Neoma Laming for her non-ST-elevation myocardial infarction. The patient did not have any further episodes of chest pain, did not have any EKG changes. The patient's pulmonary edema improved with IV Lasix and her oxygenation is better on room air to 96%. The patient right now is on IV Lasix but we changed it to p.o. to go home with. She does have history of blood pressure up to 167/74, and also it was 180/90. Hydralazine was increased to 50 mg. Metoprolol is to start at 50 mg every  12 hours. She is also on Lasix and Imdur.  Blood pressure this morning is a little bit elevated, but she has no symptoms. She needs to follow up with blood pressure recordings with her primary doctor.  2. Acute on chronic kidney failure: The patient had BUN of 38, creatinine 3.08 on admission. The patient will follow with a nephrologist as an outpatient.  She was seen by the nephrologist here.  Acute renal failure resolved. Chronic kidney disease stage IV. The patient is advised to follow up with them. The patient  already has plans to have possible dialysis in near future according to the nephrologist. The patient does have diabetic nephropathy and hypertensive nephrosclerosis and diabetic retinopathy and  proteinuria. At this time we told the patient to avoid nephrotoxic agents and NSAIDs. The patient is going to have a LexiScan stress test. If it is abnormal she is going to need cardiac catheterization.  3. The patient does have anemia of chronic kidney disease, which is worse on admission. The patient did receive 2 units of transfusion. Her hemoglobin on admission was 7.3 and hematocrit was 24.2. She did receive 2 units of transfusion. Stool guaiacs were negative. She was seen by Dr. Vira Agar who did not recommend colonoscopy because of non-ST-elevation myocardial infarction. She needs to follow up with her primary doctor for stool guaiacs. The patient's stool guaiacs have been negative, but she will need probably esophagogastroduodenoscopy or colonoscopy in the near future once her cardiac work-up is stable. The patient had EGD and colonoscopy in 2004. At that time colonoscopy showed internal hemorrhoids and EGD showed some gastritis. The patient right now is on PPI. She is advised to continue that. The patient's hemoglobin was stable after transfusion and stayed at 10.4.  4. Hypoglycemia. When she came in she was started on clear liquids. She had diabetes. She was on Lantus. Lantus was continued but her blood sugar dropped to 29 on 06/09 so I stopped the Lantus and continued only on sliding scale. Her sugars have improved after stopping the Lantus. I advanced the diet as well. Blood sugars have improved to 200s. I told the patient as she is eating well she can restart the Lantus. Her diabetes is controlled with Lantus and sliding scale insulin.  5. Renal mass. The patient does have a history of left renal mass. The patient is followed by Dr. [name] as an outpatient. The patient has a cystic mass in the left kidney. MRA  done on 06/01 showed a complex cystic mass in the left kidney. The patient has to follow up with Dr. [name] as an outpatient.  Dr. Maudie Mercury, the urologist on-call, suggested no acute intervention for this and she can follow up with Dr. [name] as an outpatient.   CONDITION ON DISCHARGE: Stable.   FOLLOWUP: The patient needs to follow up with Dr. Neoma Laming and also Dr. Anthonette Legato and her primary care physician.  LABORATORY, RADIOLOGICAL, AND DIAGNOSTIC DATA: EKG: Sinus tach on admission. The patient's troponin 2.7 on admission. Basic metabolic panel: Sodium 528, potassium 5.3, chloride 110, bicarbonate 18, BUN 43, creatinine 3.2, and glucose 97 on admission. Transfused two units. Troponin dropped to 2.5. Hemoglobin improved after transfusion. Echocardiogram showed ejection fraction more than 55%. Chest x-ray on admission showed bilateral diffuse interstitial thickening suggesting interstitial edema.  The patient's creatinine did come down to 2.95 this morning and also her hemoglobin did stay around 10.4 and hematocrit 32.   TIME SPENT ON DISCHARGE PREPARATION: More than 30 minutes.  ____________________________ Epifanio Lesches, MD sk:bjt D: 12/26/2011 10:54:19 ET T: 12/27/2011 11:59:23 ET JOB#: [id number]  cc: Epifanio Lesches, MD, <Dictator> Alcus Dad, MD Mamie Levers, MD [name] David, MD Epifanio Lesches MD ELECTRONICALLY SIGNED 01/05/2012 16:40

## 2014-11-16 NOTE — Consult Note (Signed)
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

## 2014-11-16 NOTE — H&P (Signed)
PATIENT NAME:  [name], [name] MR#:  [id number] DATE OF BIRTH:  1956/03/30  DATE OF ADMISSION:  09/13/2014  ADDENDUM  I discussed the patient's condition and plan of treatment with the patient. I also asked the patient's code status. The patient said she is a DNR and a DNI status.     ____________________________ Demetrios Loll, MD qc:mw D: 09/13/2014 15:55:23 ET T: 09/13/2014 16:40:32 ET JOB#: [id number]  cc: Demetrios Loll, MD, <Dictator> Demetrios Loll MD ELECTRONICALLY SIGNED 09/13/2014 21:36

## 2014-11-16 NOTE — Consult Note (Signed)
Pt seen and examined. Please see [name]'s notes. Lipase going up but no GI sxs now. Had 2 weeks of nausea/vomiting and epigastric pain before admission.  Abdomen soft and nontender. CT of pancreas normal. On higher dose of crestor. Had bout of pancreatitis many years ago without obvious causes. Will see if lipase down after HD. Will see if level drops while NPO. Will follow. Thanks.  Electronic Signatures: Verdie Shire (MD)  (Signed on 02-Mar-16 15:59)  Authored  Last Updated: 02-Mar-16 15:59 by Verdie Shire (MD)

## 2014-11-16 NOTE — H&P (Signed)
PATIENT NAME:  [name], FONTANELLA MR#:  [id number] DATE OF BIRTH:  1955/11/05  DATE OF ADMISSION:  09/13/2014  PRIMARY CARE PHYSICIAN: Laurian Brim, MD  REFERRING PHYSICIAN: Dr. Benjaman Lobe.  CHIEF COMPLAINT:  Chest pain and vomiting on and off for 1 week.   HISTORY OF PRESENT ILLNESS: A 59 year old Caucasian female with a history of CAD, MI and ESRD, presented to the ED with chest pain and vomiting on and off for the past 1 week. The patient is alert, awake, oriented, in no acute distress. The patient said she started to have chest pain on the left side, which is dull, on and off without radiation. In addition, the patient has had nausea, vomiting, intermittently for the past 1 week, but the patient denies any diaphoresis. No headache or dizziness, denies any orthopnea, nocturnal dyspnea, or leg edema. The patient denies any cough, or sputum or shortness of breath. The patient was found to have elevated troponin of 0.71 and was treated with nitroglycerin and aspirin in the ED.  The patient was noted was diagnosed with non-STEMI last November and 3-vessel disease. She was transferred to [facility] where she got 2 cardiac stent.  After discharge, the patient has been on aspirin and Plavix in this setting.   PAST MEDICAL HISTORY:, 1.  MI. 2.  CAD with 2 stents last year. 3.  ESRD.  4.  MRSA infection with peritonitis in the past.   5.  UTI.    6.  Hypertension. 7.  Diabetes. 8.  CHF. 9.  GERD. 10. Hyperlipidemia.   SOCIAL HISTORY: No smoking or drinking or illicit drugs.   FAMILY HISTORY: CAD and diabetes.   PAST SURGICAL HISTORY:  1.  Hysterectomy. 2.  Right foot fracture. 3.  Back surgery.  4.  Bilateral cataract surgery. 6.  Cholecystectomy.  7.  Salivary gland excision.   ALLERGIES:  DOXYCYCLINE.   HOME MEDICATIONS: Zofran ODT 4 mg p.o. every 4 to 6 hours p.r.n., Ropinirole 0.25 mg p.o. t.i.d., vitamin B complex with C and folic acid tablets 1 tablet p.o. at bedtime,  ProAir HFA  CFC free 90 mcg inhaled 2 puffs every 4 hours p.r.n., Plavix 75 mg p.o. daily, Zofran 4 mg p.o. every 6 to 8 hours p.r.n., omeprazole 20 mg p.o. b.i.d., losartan 50 mg p.o. daily, levothyroxine 50 mcg p.o. daily, Lantus 40 units subcutaneous once a day, Humalog sliding scale p.r.n., glyburide 10 mg p.o. b.i.d., fluticasone nasal 50 mcg 1 spray to each nostril once a day, Colace 100 mg p.o. daily, Crestor 10 mg p.o. daily, Coreg 25 mg p.o. b.i.d., calcium with Vitamin D3 at 1 tablet once a day, aspirin 81 mg p.o. daily, amiodarone 200 mg p.o. daily, Percocet, Norco 325 mg/5 mg p.o. 1 to 2 tablets every 4 to 6 hours p.r.n.   REVIEW OF SYSTEMS.  CONSTITUTIONAL: The patient denies any fever or chills. No headache or dizziness. No weakness.  EYES: No double vision or blurry vision, but has poor vision.  EARS, NOSE, AND THROAT:  No postnasal drip, slurred speech or dysphagia.  CARDIOVASCULAR: Positive for chest pain. No palpitations. No orthopnea or nocturnal dyspnea. No leg edema.  PULMONARY: No cough, sputum, shortness of breath, or hematemesis.  GASTROINTESTINAL: No abdominal pain, but has some nausea, vomiting, no diarrhea, melena, or bloody stool.   GENITOURINARY:  No dysuria, hematuria, or incontinence.  SKIN: No rash or jaundice.  NEUROLOGY: No syncope, loss of consciousness, or seizure.  ENDOCRINE: No polyuria, polydipsia, heat or cold intolerance.  HEMATOLOGY: No easy bruising or bleeding.   PHYSICAL EXAMINATION:  VITAL SIGNS: Temperature 98.4, blood pressure 137/64, pulse 71, oxygen saturation 100% on oxygen.  GENERAL: The patient is alert, awake, oriented, in no acute distress.  HEENT: Pupils round, equal, reactive to light and accommodation. Moist oral mucosa. Clear oropharynx.  NECK: Supple. No JVD or carotid bruits, no lymphadenopathy, no thyromegaly.  CARDIOVASCULAR: S1, S2 regular rate and rhythm. No murmurs, gallop.  PULMONARY: Bilateral air entry. No wheezing or rales. No use of  accessory muscles to breathe.  ABDOMEN: Soft. No distention or tenderness. No organomegaly. Bowel sounds present.  EXTREMITIES: No edema, clubbing or cyanosis. No calf tenderness. Bilateral pedal pulses present.  SKIN: No rash or jaundice.  NEUROLOGIC: A and O x 3. No focal deficit. sensation intact.   LABORATORY DATA: Chest x-ray, no active cardiopulmonary disease. Troponin 0.74 and CBC in normal range.  Glucose 236, BUN 26, creatinine 3.75. Electrolytes are normal. Lipase is 408, INR 1.0.  EKG showed sinus rhythm at 69 BPM with second degree AV block, lead II, AVF and V4 to V6 ST depression.    IMPRESSIONS:  1.  Acute non ST segment myocardial infarction.   2.  End-stage renal disease.   3.  Coronary artery disease.  4.  Hypertension.  5.  Diabetes.  6.  Hyperlipidemia.   PLAN OF TREATMENT:  1.  The patient will be admitted to telemetry floor.  The patient was treated with aspirin and Nitro in the ED.  I will start a heparin drip and continue aspirin and Plavix and statin.  In addition, I discussed with Dr. Humphrey Rolls, cardiologist, about the patient's condition and the EKG changes. Also, the patient had bradycardia in the 30s twice.  Dr. Humphrey Rolls suggested hold amiodarone and Coreg, start heparin drip and given Nitro patch.  He will do a cardiac catheterization possibly on Monday.  2.  For hypertension, we will continue losartan and give hydralazine IV p.r.n., but hold Coreg. 3.  For diabetes, I will start a sliding scale, continue Lantus 40 units subcutaneous at bedtime.  4.  For end-stage renal disease, I will request a nephrology consult for hemodialysis.  5.  I discussed the patient's condition and plan of treatment with the patient and nurse and Dr. Humphrey Rolls.   TIME SPENT: About 65 minutes.     ____________________________ Demetrios Loll, MD qc:DT D: 09/13/2014 15:54:03 ET T: 09/13/2014 16:56:08 ET JOB#: [id number]  cc: Demetrios Loll, MD, <Dictator> Demetrios Loll MD ELECTRONICALLY SIGNED 09/14/2014  13:48

## 2014-11-16 NOTE — Consult Note (Signed)
PATIENT NAME:  [name], HAST MR#:  [id number] DATE OF BIRTH:  1955/11/12  DATE OF CONSULTATION:  09/16/2014  REFERRING PHYSICIAN:   CONSULTING PHYSICIAN:  Andyn Sales A. Keyaan Lederman, MD  REFERRING PHYSICIAN:   INDICATION FOR CONSULTATION:  Incidental renal mass.  HISTORY OF PRESENT ILLNESS: This is a 59 year old female admitted with chest pain and was found to have an acute myocardial infarction. She has been on dialysis for approximately 3 years by history. A few years ago, she had what appeared to be an ablation procedure performed transcutaneously of a left renal mass. She said things have gone well since then. She had a CT scan for an elevated pancreatic enzymes and the left renal mass in the lower pole is stable, but there may be a new lesion next to it in the lower pole, appropriately 2.5 cm in size. She has not been having flank pain or blood in the urine.   She voids once or twice a day, a small amount on dialysis. She has only had 1 urinary tract infection.  There is no other modifying factor, associated signs or symptoms. There is no other aggravating or relieving factors. The presentation is moderate in severity and ongoing.   PAST MEDICAL HEALTH HISTORY: Coronary artery disease and end-stage renal disease; history of congestive heart failure and GERD.   HOME MEDICATIONS:  Plavix and other cardiac medications and other medications listed.   SOCIAL HISTORY: Lives locally.   FAMILY HISTORY: No relevant family history of GU disease.   ALLERGIES: DOXYCYCLINE.  REVIEW OF SYSTEMS: The rest of review of systems was negative.  PHYSICAL EXAMINATION:  VITAL SIGNS: Normal.  CARDIOVASCULAR: Skin warm to touch with a reasonably regular pulse. RESPIRATORY: Breaths are quiet.  NEUROLOGIC: Alert and oriented. ABDOMEN: No abdominal masses.  GENITOURINARY: No CVA tenderness.  SKIN: No rash. LYMPHATIC: No inguinal adenopathy.  LABORATORY DATA:  A review of x-ray, I reviewed her CT scan and  noted in the history of present illness, the findings.  ASSESSMENT AND PLAN : Ms. Haft may have a local recurrence of a renal mass in the lower pole. She is going to follow up with Dr. Erlene Quan as an outpatient. I recommended that it would be very reasonable to get an MRI renal protocol with and without contrast while she is in the hospital and that will help delineate the lesion. Obviously, her comorbidities will be taken into consideration in her future management, if she truly has a recurrent renal cell.    ____________________________ Elayne Snare. Amad Mau, MD sam:mw D: 09/16/2014 18:29:03 ET T: 09/16/2014 18:49:58 ET JOB#: [id number]  cc: Nicki Reaper A. Naomia Lenderman, MD, <Dictator> Elayne Snare Taisha Pennebaker MD ELECTRONICALLY SIGNED 11/06/2014 9:24

## 2014-11-16 NOTE — Consult Note (Signed)
Brief Consult Note: Diagnosis: chest pain.   Patient was seen by consultant.   Consult note dictated.   Comments: Appreciate consult for 59 y/o caucasian woman with history of MI 11/15 s/p stent placement on Plavix, ESRD on dialysis, htn, dm, HL, kidney cancer 2013, admitted with chestpain/nstemi, for evaluation of elevated lipase. Has had unremarkable [place]. CT scan with recurrent kidney mass, but pancreas/liver/biliary normal. Lipase slightly elevated on admission but has been increasing as her stay goes on. Says she had some nausea and vomiting and pain across the lower chest/upper abdomen last week, but all this has improved. Denies abdominal pain, NV, hx PUD, melena, NSAIDs, chalky stools, and all other GI related complaints. Reports a history of pancreatitis 2001 with idopathic cause, not recurred. States she had significant abdominal pain and felt unwell at that time- and has none of this today. There is no family history of GI issues. In medication review, pancreatitis is a listed side effect of crestor. She normally takes 10mg  po daily at home. Has been receiving 20mg  po daily here.  Impression and plan: Elevated lipase. No other typical findings of pancreatitis. DDX does include renal disease. She has no signs of PUD and no history of IBD. ?possibly related to her decreased renal function and doubled crestor dose. Had dialysis today, so will recheck lipase now. Consider dose reduction or other crestor mgmt.  Further recommendations to follow..  Electronic Signatures: Stephens November H (NP)  (Signed 02-Mar-16 16:06)  Authored: Brief Consult Note   Last Updated: 02-Mar-16 16:06 by Theodore Demark (NP)

## 2014-11-16 NOTE — Consult Note (Signed)
PATIENT NAME:  [name], [name] MR#:  [id number] DATE OF BIRTH:  1956/05/28  DATE OF CONSULTATION:  09/17/2014  REASON FOR CONSULTATION: GI consult ordered by Dr. Bridgett Larsson for evaluation of elevated lipase.   HISTORY OF PRESENT ILLNESS: I appreciate consult for this 59 year old Caucasian woman with history of MI in November of 2015 status post stent placement on Plavix, ESRD on dialysis, hypertension, diabetes, hyperlipidemia, kidney cancer in 2013, admitted with chest pain, NSTEMI for evaluation of elevated lipase. Has had unremarkable ultrasound. CT scan with kidney mass, but the pancreas, liver biliary area normal. Lipase slightly elevated on admission, but has been increasing since that time. She said she had some nausea, vomiting and pain across the lower chest and upper abdomen last week, but all of this is improved. Denies abdominal pain, nausea, vomiting, history of peptic ulcer disease, melena, NSAIDs, chalky stools and all other GI-related complaints. Reports a history of pancreatitis in 2001 with idiopathic cause, not recurred. She states she had significant abdominal pain and felt unwell at that time and has none of this today. There is no family history of GI issues. In medication review, pancreatitis is a listed side effect of Crestor. She normally takes 10 mg p.o. daily at home. She has been receiving 20 mg p.o. daily here.   IMPRESSION AND PLAN: Elevated lipase. No other typical findings of pancreatitis. DDX does include renal disease. She has no signs of PUD. No history of IBD. Maybe  this is possibly related to her decreased renal function and doubled Crestor dose. She had dialysis today, so we will recheck the lipase now,  consider dose reduction or other crestor management. Further recommendations to follow. These services were provided by Stephens November, MSN, [name], in collaboration with Dr. Verdie Shire, MD, with whom I have discussed this patient with in full. Thank you for this consult.   ____________________________ Theodore Demark, NP chl:ap D: 09/17/2014 16:05:56 ET T: 09/17/2014 16:31:06 ET JOB#: [id number]  cc: Theodore Demark, NP, <Dictator> [place] SIGNED 09/18/2014 10:14

## 2014-11-16 NOTE — Consult Note (Signed)
PATIENT NAME:  [name], [name] MR#:  [id number] DATE OF BIRTH:  1955-12-24  DATE OF CONSULTATION:  09/14/2014  CONSULTING PROVIDER:  Kelby Fam. Dorena Cookey, PA-C  INDICATION FOR CONSULT: Non-STEMI.    HISTORY OF PRESENT ILLNESS: This is a 59 year old pleasant white female well known to our practice with a past medical history of coronary artery disease status post PCI in November, end-stage renal disease, congestive heart failure, hypertension, and hyperlipidemia who presented to the Emergency Room yesterday presenting of nausea and vomiting for about 2 weeks, and new onset chest pain last night. She states chest pain started in the right side of chest and moved to the left side of chest when changing positions, was dull in nature lasting about 5 minutes. Currently she is chest pain-free, nausea and vomiting have also resolved.   PAST MEDICAL HISTORY: Coronary artery disease status post high risk PCI in November at [facility], end-stage renal disease on hemodialysis, hypertension, hyperlipidemia, diabetes mellitus, congestive heart failure, and GERD.   HOME MEDICATIONS: Plavix 75 mg daily, omeprazole 20 mg b.i.d., losartan 50 mg daily, levothyroxine 50 mcg daily, Coreg 25 mg b.i.d., amiodarone 200 mg daily, and Crestor 10 mg daily.   SOCIAL HISTORY: No EtOH, drug abuse or tobacco use.   ALLERGIES: DOXYCYCLINE.  REVIEW OF SYSTEMS: CONSTITUTIONAL: No fatigue or malaise currently but did have fatigue for the past 2 weeks.  CARDIOVASCULAR: Positive for chest pain, palpitations, PND and orthopnea all have since resolved.  PULMONARY: Positive for shortness of breath and cough on admission since have also resolved.  GASTROINTESTINAL: No abdominal pain. Nausea and vomiting have also resolved.   PHYSICAL EXAMINATION:  VITAL SIGNS: Temperature 97.5, pulse 76, respirations 18, blood pressure 169/68, pulse oximetry 100% saturation on 2 liters.   GENERAL: Alert and oriented x 3, in no acute distress.  HEENT:  No JVD or carotid bruit.  PULMONARY: Good air entry bilaterally. No wheezes, rhonchi, or rales.  CARDIOVASCULAR: Normal S1, S2. No audible murmurs.  ABDOMEN: Soft, nontender. Positive bowel sounds.  EXTREMITIES: No pedal edema.   LABORATORY DATA: Creatinine 4.39, troponin 0.74, 0.78, 0.87, INR 1.0. EKG shows normal sinus rhythm, 80 beats per minute, first degree AV block, 1 mm upsloping ST elevation in septal leads as well as left atrial enlargement, nonspecific ST and T changes.   ASSESSMENT AND PLAN: Non-ST elevation myocardial infarction:  Advised continuing with home medications and heparin. We will plan for cardiac catheterization first case tomorrow morning if available. We will continue to follow. Thank you very much for this consult.   ____________________________ Kelby Fam. Baldwin [place] ear:mc D: 09/14/2014 11:24:07 ET T: 09/14/2014 11:40:43 ET JOB#: [id number]  cc: Dyann Ruddle A. Baldwin [place], <Dictator> Kelby Fam Ezel Vallone PA ELECTRONICALLY SIGNED 10/17/2014 13:45

## 2014-11-16 NOTE — Discharge Summary (Signed)
PATIENT NAME:  [name], [name] MR#:  [id number] DATE OF BIRTH:  Oct 22, 1955  DATE OF ADMISSION:  09/13/2014 DATE OF DISCHARGE:  09/18/2014  PRIMARY CARE PHYSICIAN: Morton Peters., MD  DISCHARGE DIAGNOSES: 1.  Non-ST elevation myocardial infarction status post cardiac catheterization. 2.  End-stage renal disease.  3.  Diabetes, 2.  4.  Malignant hypertension.  5.  Cardiomyopathy, ejection fraction 40%.  6.  Elevated lipase.  7. Possible recurrence of left renal carcinoma  CONDITION: Stable.   CODE STATUS: Do not resuscitate.   HOME MEDICATIONS: Please refer to the medication reconciliation list. The patient needs to resume home with nurse and social worker.   DIET: Low-sodium, low-fat, low-cholesterol, ADA, and renal diet.   ACTIVITY: As tolerated.   FOLLOWUP CARE: Follow up with Dr. Humphrey Rolls within 1-2 days.   REASON FOR ADMISSION: Chest pain and vomiting on and off for 1 week.  HOSPITAL COURSE: The patient is a 59 years old Caucasian female with a history of CAD and ESRD presenting to the ED with chest pain and vomiting on and off for 1 week. The patient was noted to have an elevated troponin of 0.71. She was treated with nitroglycerin and aspirin in the ED for non-STEMI. For detailed history and the physical examination, please refer to the admission note dictated by me.  Laboratory data on admission date showed troponin 0.74. CBC was in normal range. Glucose 236, BUN 26, creatinine 3.75. Electrolytes were normal. Lipase 408. Chest x-ray showed no active cardiopulmonary disease.   For the non-STEMI, after admission the patient has been treated with aspirin, Plavix, statin, heparin drip.  Dr. Humphrey Rolls did a cardiac catheterization which showed stent in the mid LAD and the mid RCA, patent and occluded LCX is chronic. Dr. Humphrey Rolls advised medical treatment, but hold amiodarone. Otherwise, very small dose of Coreg 3.25 mg b.i.d.  For ESRD, the patient has been on dialysis. Diabetes has  been controlled with Lantus and sliding scale. The patient had hypertensive malignancy. Blood pressure is controlled with Coreg, losartan, and Imdur. The patient's lipase has been elevated. The patient was kept n.p.o. for 1 day. After dialysis, patient's lipase decreased to 400 today. The patient has no complaints. Did not show any pancreatitis.  Second-degree AV block. According to Dr. Humphrey Rolls discontinue amiodarone, but continue low-dose Coreg.   Left renal carcinoma. According to urologist, Dr. Rogue Bussing, patient possibly has a local recurrence of left renal carcinoma. He recommended following with urologist as an outpatient. The patient has no complaints. Her vital signs are stable. She is clinically stable. Will be discharged to home with home health and social worker today. I discussed the patient's discharge plan with the patient, nurse, case manager.   TIME SPENT: About 42 minutes.   ____________________________ Demetrios Loll, MD qc:am D: 09/18/2014 17:29:31 ET T: 09/19/2014 03:00:50 ET JOB#: [id number]  cc: Demetrios Loll, MD, <Dictator> Demetrios Loll MD ELECTRONICALLY SIGNED 09/19/2014 17:15

## 2014-12-08 ENCOUNTER — Encounter
Admission: RE | Admit: 2014-12-08 | Discharge: 2014-12-08 | Disposition: A | Discharge status: Home / Self Care | Payer: Medicare Other | Source: Ambulatory Visit | Attending: Vascular Surgery | Admitting: Vascular Surgery

## 2014-12-08 DIAGNOSIS — N186 End stage renal disease: Secondary | ICD-10-CM | POA: Insufficient documentation

## 2014-12-08 DIAGNOSIS — Z79899 Other long term (current) drug therapy: Secondary | ICD-10-CM | POA: Diagnosis not present

## 2014-12-08 DIAGNOSIS — Z7982 Long term (current) use of aspirin: Secondary | ICD-10-CM | POA: Insufficient documentation

## 2014-12-08 DIAGNOSIS — I509 Heart failure, unspecified: Secondary | ICD-10-CM | POA: Diagnosis not present

## 2014-12-08 DIAGNOSIS — I499 Cardiac arrhythmia, unspecified: Secondary | ICD-10-CM | POA: Insufficient documentation

## 2014-12-08 DIAGNOSIS — Z01810 Encounter for preprocedural cardiovascular examination: Secondary | ICD-10-CM | POA: Insufficient documentation

## 2014-12-08 DIAGNOSIS — E119 Type 2 diabetes mellitus without complications: Secondary | ICD-10-CM | POA: Insufficient documentation

## 2014-12-08 HISTORY — DX: Dependence on renal dialysis: Z99.2

## 2014-12-08 HISTORY — DX: Unspecified atrial fibrillation: I48.91

## 2014-12-08 HISTORY — DX: Acute pancreatitis without necrosis or infection, unspecified: K85.90

## 2014-12-08 HISTORY — DX: Type 2 diabetes mellitus with diabetic autonomic (poly)neuropathy: E11.43

## 2014-12-08 HISTORY — DX: Type 2 diabetes mellitus with diabetic polyneuropathy: E11.42

## 2014-12-08 HISTORY — DX: Heart failure, unspecified: I50.9

## 2014-12-08 HISTORY — DX: Gastro-esophageal reflux disease without esophagitis: K21.9

## 2014-12-08 LAB — TYPE AND SCREEN
ABO/RH(D): O POS
ANTIBODY SCREEN: NEGATIVE

## 2014-12-08 LAB — ABO/RH: ABO/RH(D): O POS

## 2014-12-08 LAB — POTASSIUM: POTASSIUM: 4.5 mmol/L (ref 3.5–5.1)

## 2014-12-08 NOTE — Patient Instructions (Addendum)
  Your procedure is scheduled on: June 2, 2016Report to Same Day Surgery. To find out your arrival time please call [phone] between 1PM - 3PM on December 17, 2014.  Remember: Instructions that are not followed completely may result in serious medical risk, up to and including death, or upon the discretion of your surgeon and anesthesiologist your surgery may need to be rescheduled.    __x__ 1. Do not eat food or drink liquids after midnight. No gum chewing or hard candies.     ____ 2. No Alcohol for 24 hours before or after surgery.   ____ 3. Bring all medications with you on the day of surgery if instructed.    __x__ 4. Notify your doctor if there is any change in your medical condition     (cold, fever, infections).     Do not wear jewelry, make-up, hairpins, clips or nail polish.  Do not wear lotions, powders, or perfumes. You may wear deodorant.  Do not shave 48 hours prior to surgery. Men may shave face and neck.  Do not bring valuables to the hospital.    [facility] is not responsible for any belongings or valuables.               Contacts, dentures or bridgework may not be worn into surgery.  Leave your suitcase in the car. After surgery it may be brought to your room.  For patients admitted to the hospital, discharge time is determined by your                treatment team.   Patients discharged the day of surgery will not be allowed to drive home.   Please read over the following fact sheets that you were given:   MRSA Information and Surgical Site Infection Prevention   __X__ Take these medicines the morning of surgery with A SIP OF WATER:    1. Carvedilol  2. HydrAlazine  3. Losartan  4.Omeprazole  5.Isosorbide  6.NO INSULIN MORNING OF SURGERY  ____ Fleet Enema (as directed)   __x_ Use CHG Soap as directed  __x__ Use inhalers on the day of surgery (Albuterol inhaler as usual)  ____ Stop metformin 2 days prior to surgery    __x__ Take 1/2 of usual insulin  dose the night before surgery and none on the morning of surgery. (1/2 Lantus the night before surgery)_X Stop Coumadin/Plavix/aspirin on  (stop Plavix 4-5 days prior to surgery and restart ASAP after fistula is placed) ____ Stop Anti-inflammatories on    ____ Stop supplements until after surgery.    ____ Bring C-Pap to the hospital.

## 2014-12-18 ENCOUNTER — Encounter: Payer: Self-pay | Admitting: *Deleted

## 2014-12-18 ENCOUNTER — Ambulatory Visit
Admission: RE | Admit: 2014-12-18 | Discharge: 2014-12-18 | Disposition: A | Discharge status: Home / Self Care | Payer: Medicare Other | Source: Ambulatory Visit | Attending: Vascular Surgery | Admitting: Vascular Surgery

## 2014-12-18 DIAGNOSIS — K219 Gastro-esophageal reflux disease without esophagitis: Secondary | ICD-10-CM | POA: Insufficient documentation

## 2014-12-18 DIAGNOSIS — E1122 Type 2 diabetes mellitus with diabetic chronic kidney disease: Secondary | ICD-10-CM | POA: Insufficient documentation

## 2014-12-18 DIAGNOSIS — Z7982 Long term (current) use of aspirin: Secondary | ICD-10-CM | POA: Insufficient documentation

## 2014-12-18 DIAGNOSIS — Z794 Long term (current) use of insulin: Secondary | ICD-10-CM | POA: Diagnosis not present

## 2014-12-18 DIAGNOSIS — I252 Old myocardial infarction: Secondary | ICD-10-CM | POA: Diagnosis not present

## 2014-12-18 DIAGNOSIS — J449 Chronic obstructive pulmonary disease, unspecified: Secondary | ICD-10-CM | POA: Diagnosis not present

## 2014-12-18 DIAGNOSIS — Z992 Dependence on renal dialysis: Secondary | ICD-10-CM | POA: Diagnosis not present

## 2014-12-18 DIAGNOSIS — Z539 Procedure and treatment not carried out, unspecified reason: Secondary | ICD-10-CM | POA: Insufficient documentation

## 2014-12-18 DIAGNOSIS — I509 Heart failure, unspecified: Secondary | ICD-10-CM | POA: Diagnosis not present

## 2014-12-18 DIAGNOSIS — E039 Hypothyroidism, unspecified: Secondary | ICD-10-CM | POA: Insufficient documentation

## 2014-12-18 DIAGNOSIS — I499 Cardiac arrhythmia, unspecified: Secondary | ICD-10-CM | POA: Diagnosis not present

## 2014-12-18 DIAGNOSIS — I2510 Atherosclerotic heart disease of native coronary artery without angina pectoris: Secondary | ICD-10-CM | POA: Insufficient documentation

## 2014-12-18 DIAGNOSIS — Z7902 Long term (current) use of antithrombotics/antiplatelets: Secondary | ICD-10-CM | POA: Diagnosis not present

## 2014-12-18 DIAGNOSIS — I120 Hypertensive chronic kidney disease with stage 5 chronic kidney disease or end stage renal disease: Secondary | ICD-10-CM | POA: Insufficient documentation

## 2014-12-18 DIAGNOSIS — Z955 Presence of coronary angioplasty implant and graft: Secondary | ICD-10-CM | POA: Insufficient documentation

## 2014-12-18 DIAGNOSIS — N186 End stage renal disease: Secondary | ICD-10-CM | POA: Insufficient documentation

## 2014-12-18 DIAGNOSIS — Z79899 Other long term (current) drug therapy: Secondary | ICD-10-CM | POA: Diagnosis not present

## 2014-12-18 DIAGNOSIS — E1140 Type 2 diabetes mellitus with diabetic neuropathy, unspecified: Secondary | ICD-10-CM | POA: Insufficient documentation

## 2014-12-18 LAB — GLUCOSE, CAPILLARY: GLUCOSE-CAPILLARY: 226 mg/dL — AB (ref 65–99)

## 2014-12-18 MED ORDER — BUPIVACAINE-EPINEPHRINE (PF) 0.5% -1:200000 IJ SOLN
INTRAMUSCULAR | Status: AC
  Filled 2014-12-18: qty 30

## 2014-12-18 MED ORDER — CEFAZOLIN SODIUM 1-5 GM-% IV SOLN
INTRAVENOUS | Status: AC
  Filled 2014-12-18: qty 50

## 2014-12-18 MED ORDER — SODIUM CHLORIDE 0.9 % IV SOLN
INTRAVENOUS | Status: DC
  Administered 2014-12-18: 12:00:00 via INTRAVENOUS

## 2014-12-18 MED ORDER — CEFAZOLIN SODIUM 1-5 GM-% IV SOLN: 1.0000 g | Freq: Once | INTRAVENOUS | Status: DC

## 2014-12-18 MED ORDER — PAPAVERINE HCL 30 MG/ML IJ SOLN
INTRAMUSCULAR | Status: AC
  Filled 2014-12-18: qty 2

## 2014-12-18 MED ORDER — HEPARIN SODIUM (PORCINE) 5000 UNIT/ML IJ SOLN
INTRAMUSCULAR | Status: AC
  Filled 2014-12-18: qty 1

## 2014-12-18 NOTE — Anesthesia Preprocedure Evaluation (Addendum)
Anesthesia Evaluation  Patient identified by MRN, date of birth, ID band Patient awake    Reviewed: Allergy & Precautions, NPO status , Patient's Chart, lab work & pertinent test results, reviewed documented beta blocker date and time   Airway Mallampati: II  TM Distance: >3 FB     Dental  (+) Poor Dentition, Missing, Chipped   Pulmonary COPD COPD inhaler,  breath sounds clear to auscultation  Pulmonary exam normal       Cardiovascular hypertension, Pt. on home beta blockers + CAD, + Past MI, + Cardiac Stents and +CHF Rhythm:Irregular     Neuro/Psych  Neuromuscular disease negative psych ROS   GI/Hepatic GERD-  Medicated and Controlled,  Endo/Other  diabetes, Well Controlled, Type 2, Insulin Dependent, Oral Hypoglycemic Agents  Renal/GU ESRFRenal disease  negative genitourinary   Musculoskeletal negative musculoskeletal ROS (+)   Abdominal Normal abdominal exam  (+)   Peds negative pediatric ROS (+)  Hematology negative hematology ROS (+)   Anesthesia Other Findings ESRD.  Peripheral neuropathy. K 4.5. plavix held. Received coreg this am.  Reproductive/Obstetrics                          Anesthesia Physical Anesthesia Plan  ASA: III  Anesthesia Plan: General   Post-op Pain Management:    Induction: Intravenous  Airway Management Planned: LMA  Additional Equipment:   Intra-op Plan:   Post-operative Plan:   Informed Consent: I have reviewed the patients History and Physical, chart, labs and discussed the procedure including the risks, benefits and alternatives for the proposed anesthesia with the patient or authorized representative who has indicated his/her understanding and acceptance.   Dental advisory given  Plan Discussed with: CRNA and Surgeon  Anesthesia Plan Comments:        Anesthesia Quick Evaluation

## 2014-12-23 ENCOUNTER — Telehealth: Payer: Self-pay

## 2014-12-23 ENCOUNTER — Encounter: Payer: Medicare Other | Attending: Surgery | Admitting: Surgery

## 2014-12-23 DIAGNOSIS — Z992 Dependence on renal dialysis: Secondary | ICD-10-CM | POA: Insufficient documentation

## 2014-12-23 DIAGNOSIS — H548 Legal blindness, as defined in USA: Secondary | ICD-10-CM | POA: Insufficient documentation

## 2014-12-23 DIAGNOSIS — N186 End stage renal disease: Secondary | ICD-10-CM | POA: Insufficient documentation

## 2014-12-23 DIAGNOSIS — E11622 Type 2 diabetes mellitus with other skin ulcer: Secondary | ICD-10-CM | POA: Insufficient documentation

## 2014-12-23 DIAGNOSIS — L89112 Pressure ulcer of right upper back, stage 2: Secondary | ICD-10-CM | POA: Diagnosis present

## 2014-12-23 DIAGNOSIS — S80821A Blister (nonthermal), right lower leg, initial encounter: Secondary | ICD-10-CM | POA: Insufficient documentation

## 2014-12-23 DIAGNOSIS — X58XXXA Exposure to other specified factors, initial encounter: Secondary | ICD-10-CM | POA: Insufficient documentation

## 2014-12-23 DIAGNOSIS — I2510 Atherosclerotic heart disease of native coronary artery without angina pectoris: Secondary | ICD-10-CM | POA: Insufficient documentation

## 2014-12-23 DIAGNOSIS — E1140 Type 2 diabetes mellitus with diabetic neuropathy, unspecified: Secondary | ICD-10-CM | POA: Insufficient documentation

## 2014-12-23 DIAGNOSIS — I120 Hypertensive chronic kidney disease with stage 5 chronic kidney disease or end stage renal disease: Secondary | ICD-10-CM | POA: Insufficient documentation

## 2014-12-23 NOTE — Progress Notes (Addendum)
BETTYLOU, FREW ([id number]) Visit Report for 12/23/2014 Chief Complaint Document Details [name], [name]. Date of Service: 12/23/2014 9:00 AM Medical Record Number: [id number] Patient Account Number: [id number] Date of Birth/Sex: 24-Jun-1956 (59 y.o. Female) Treating RN: Primary Care Physician: Steele Sizer Other Clinician: Referring Physician: Steele Sizer Treating Physician/Extender: Frann Rider in Treatment: 0 Information Obtained from: Patient Chief Complaint Patient presents to the wound care center for a consult due non healing wound. She has an open wound on her right upper back which she's had for about a year and she recently noticed a blister on her right lower extremity about 2 weeks ago. Electronic Signature(s) Signed: 12/23/2014 12:36:02 PM By: [name], [name] Entered By: [name] on 12/23/2014 09:56:02 Lerette, [name] ([id number]) -------------------------------------------------------------------------------- Debridement Details [name]. Date of Service: 12/23/2014 9:00 AM Medical Record Number: [id number] Patient Account Number: [id number] Date of Birth/Sex: 12-Jan-1956 (59 y.o. Female) Treating RN: Primary Care Physician: Steele Sizer Other Clinician: Referring Physician: Steele Sizer Treating Physician/Extender: Frann Rider in Treatment: 0 Debridement Performed for Wound #2 Right,Anterior Lower Leg Assessment: Performed By: Physician Pat Patrick., MD Debridement: Open Wound/Selective Debridement Selective Description: Pre-procedure Yes Verification/Time Out Taken: Start Time: 09:46 Pain Control: Lidocaine 5% topical ointment Level: Skin/Epidermis Total Area Debrided (L x 4.5 (cm) x 3.5 (cm) = 15.75 (cm) W): Tissue and other Non-Viable, Fibrin/Slough, Skin material debrided: Instrument: Forceps, Scissors Bleeding: None End Time: 09:47 Procedural Pain: 0 Post Procedural Pain:  0 Response to Treatment: Procedure was tolerated well Post Debridement Measurements of Total Wound Length: (cm) 4.5 Width: (cm) 3.5 Depth: (cm) 0.1 Volume: (cm) 1.237 Electronic Signature(s) Signed: 12/23/2014 12:36:02 PM By: [name], [name] Entered By: [name] on 12/23/2014 09:55:20 Burry, [name] ([id number]) -------------------------------------------------------------------------------- HPI Details [name]. Date of Service: 12/23/2014 9:00 AM Medical Record Number: [id number] Patient Account Number: [id number] Date of Birth/Sex: Dec 11, 1955 (59 y.o. Female) Treating RN: Primary Care Physician: Steele Sizer Other Clinician: Referring Physician: Steele Sizer Treating Physician/Extender: Frann Rider in Treatment: 0 History of Present Illness Location: right upper back and right lower extremity wounds Quality: Patient reports No Pain. Severity: Patient states wound (s) are getting better. Duration: Patient has had the wound for > 3 months prior to seeking treatment at the wound center Timing: she thought it first occurred when she was using a heating pad about a year ago after back surgery. Context: The wound appeared gradually over time Modifying Factors: Patient is currently on renal dialysis and receives treatments 3 times weekly Associated Signs and Symptoms: Patient reports having: surgery scheduled for this week for a AV fistula left arm. HPI Description: 59 year old patient who is known to be a diabetic and has end-stage renal disease has had several comorbidities including coronary artery disease, hypertension, hyperlipidemia, pancreatitis, anemia, previous history of hysterectomy, cholecystectomy, left-sided salivary gland excision, bilateral cataract surgery,Peritoneal dialysis catheter, hemodialysis catheter. the area on the back has also been caused by instant pressure she used to sleep on a recliner all day and has  significant kyphoscoliosis. As far as the wound on her right lower extremity she's not sure how this blister occurred but she thought it has been there for about 2 weeks. No recent blood investigations available and no recent hemoglobin A1c. Electronic Signature(s) Signed: 12/23/2014 12:36:02 PM By: [name], [name] Entered By: [name] on 12/23/2014 10:00:26 Landers, [name] ([id number]) -------------------------------------------------------------------------------- Physical Exam Details Patient Name: [name], [name] Date of  Service: 12/23/2014 9:00 AM Medical Record Number: [id number] Patient Account Number: [id number] Date of Birth/Sex: 1955/11/15 (59 y.o. Female) Treating RN: Primary Care Physician: Steele Sizer Other Clinician: Referring Physician: Steele Sizer Treating Physician/Extender: Frann Rider in Treatment: 0 Constitutional . Pulse regular. Respirations normal and unlabored. Afebrile. . Eyes Nonicteric. Reactive to light. Ears, Nose, Mouth, and Throat Lips, teeth, and gums WNL.Marland Kitchen Moist mucosa without lesions . Neck supple and nontender. No palpable supraclavicular or cervical adenopathy. Normal sized without goiter. Respiratory WNL. No retractions.. Cardiovascular Pedal Pulses WNL. ABI not done as she has a large blister on her right lower extremity.. No clubbing, cyanosis or edema. Gastrointestinal (GI) Abdomen without masses or tenderness.. No liver or spleen enlargement or tenderness.. Musculoskeletal Adexa without tenderness or enlargement.. Digits and nails w/o clubbing, cyanosis, infection, petechiae, ischemia, or inflammatory conditions.. Integumentary (Hair, Skin) on her right upper back she has an area which shows some pressure injury possibly due to the way she has been resting on that side and also some superficial ulcerations which are like stage II pressure ulcers.. No crepitus or fluctuance. No peri-wound warmth or erythema. No  masses.Marland Kitchen Psychiatric Judgement and insight Intact.. No evidence of depression, anxiety, or agitation.. Electronic Signature(s) Signed: 12/23/2014 12:36:02 PM By: [name], [name] Entered By: [name] on 12/23/2014 10:01:47 Szydlowski, [name] ([id number]) -------------------------------------------------------------------------------- Physician Orders Details [name]. Date of Service: 12/23/2014 9:00 AM Medical Record Number: [id number] Patient Account Number: [id number] Date of Birth/Sex: 21-Nov-1955 (59 y.o. Female) Treating RN: Junious Dresser Primary Care Physician: Steele Sizer Other Clinician: Referring Physician: Steele Sizer Treating Physician/Extender: Frann Rider in Treatment: 0 Verbal / Phone Orders: Yes Clinician: Junious Dresser Read Back and Verified: Yes Diagnosis Coding Wound Cleansing Wound #1 Right Back o Clean wound with Normal Saline. Wound #2 Right,Anterior Lower Leg o Clean wound with Normal Saline. Anesthetic Wound #1 Right Back o Topical Lidocaine 4% cream applied to wound bed prior to debridement Wound #2 Right,Anterior Lower Leg o Topical Lidocaine 4% cream applied to wound bed prior to debridement Primary Wound Dressing Wound #1 Right Back o Prisma Ag Wound #2 Right,Anterior Lower Leg o Other: - Mepitel Secondary Dressing Wound #1 Right Back o Contact Layer - Mepitel o ABD pad - secure with only paper tape Wound #2 Right,Anterior Lower Leg o Dry Gauze o Tegaderm Dressing Change Frequency Wound #1 Right Back o Change dressing every other day. Wound #2 Right,Anterior Lower Leg o Change dressing every week - to be changed next week in the wound clinic Laubach, TEYA OTTERSON. ([id number]) Follow-up Appointments Wound #1 Right Back o Return Appointment in 1 week. Wound #2 Right,Anterior Lower Leg o Return Appointment in 1 week. Off-Loading Wound #1 Right Back o Turn and reposition every  2 hours Electronic Signature(s) Signed: 12/23/2014 12:36:02 PM By: [name], [name] Signed: 12/23/2014 4:43:53 PM By: Junious Dresser RN Entered By: Junious Dresser on 12/23/2014 09:49:26 Fonseca, [name] ([id number]) -------------------------------------------------------------------------------- Problem List Details Patient Name: Sorber, Tnya S. Date of Service: 12/23/2014 9:00 AM Medical Record Number: [id number] Patient Account Number: [id number] Date of Birth/Sex: 1955-12-01 (59 y.o. Female) Treating RN: Primary Care Physician: Steele Sizer Other Clinician: Referring Physician: Steele Sizer Treating Physician/Extender: Frann Rider in Treatment: 0 Active Problems ICD-10 Encounter Code Description Active Date Diagnosis E11.622 Type 2 diabetes mellitus with other skin ulcer 12/23/2014 Yes L89.112 Pressure ulcer of right upper back, stage 2 12/23/2014 Yes S80.821A Blister (nonthermal), right lower leg, initial encounter 12/23/2014 Yes N18.6  End stage renal disease 12/23/2014 Yes Inactive Problems Resolved Problems Electronic Signature(s) Signed: 12/23/2014 12:36:02 PM By: [name], [name] Entered By: [name] on 12/23/2014 09:54:43 Kirk, [name] ([id number]) -------------------------------------------------------------------------------- Progress Note Details Patient Name: Boyajian, Fatemah S. Date of Service: 12/23/2014 9:00 AM Medical Record Number: [id number] Patient Account Number: [id number] Date of Birth/Sex: Jun 25, 1956 (59 y.o. Female) Treating RN: Primary Care Physician: Steele Sizer Other Clinician: Referring Physician: Steele Sizer Treating Physician/Extender: Frann Rider in Treatment: 0 Subjective Chief Complaint Information obtained from Patient Patient presents to the wound care center for a consult due non healing wound. She has an open wound on her right upper back which she's had for about a year and she recently noticed a blister on her  right lower extremity about 2 weeks ago. History of Present Illness (HPI) The following HPI elements were documented for the patient's wound: Location: right upper back and right lower extremity wounds Quality: Patient reports No Pain. Severity: Patient states wound (s) are getting better. Duration: Patient has had the wound for > 3 months prior to seeking treatment at the wound center Timing: she thought it first occurred when she was using a heating pad about a year ago after back surgery. Context: The wound appeared gradually over time Modifying Factors: Patient is currently on renal dialysis and receives treatments 3 times weekly Associated Signs and Symptoms: Patient reports having: surgery scheduled for this week for a AV fistula left arm. 59 year old patient who is known to be a diabetic and has end-stage renal disease has had several comorbidities including coronary artery disease, hypertension, hyperlipidemia, pancreatitis, anemia, previous history of hysterectomy, cholecystectomy, left-sided salivary gland excision, bilateral cataract surgery,Peritoneal dialysis catheter, hemodialysis catheter. the area on the back has also been caused by instant pressure she used to sleep on a recliner all day and has significant kyphoscoliosis. As far as the wound on her right lower extremity she's not sure how this blister occurred but she thought it has been there for about 2 weeks. No recent blood investigations available and no recent hemoglobin A1c. Wound History Patient presents with 2 open wounds that have been present for approximately over year. Patient has been treating wounds in the following manner: dey deressing. The wounds have been healed in the past but have re-opened. Laboratory tests have been performed in the last month. Patient reportedly has not tested positive for an antibiotic resistant organism. Patient reportedly has not tested positive for osteomyelitis. Patient  reportedly has not had testing performed to evaluate circulation in the legs. Patient History Information obtained from Patient. HALSTON, FAIRCLOUGH ([id number]) Allergies No known Allergies Family History Cancer - Father, Mother, Diabetes - Father, Mother, Heart Disease - Mother, Hypertension - Mother, Father, Lung Disease - Mother, No family history of Hereditary Spherocytosis, Kidney Disease, Seizures, Stroke, Thyroid Problems, Tuberculosis. Social History Never smoker, Marital Status - Married, Alcohol Use - Never, Drug Use - No History, Caffeine Use - Daily - tea, cofee. Medical History Eyes Patient has history of Glaucoma Ear/Nose/Mouth/Throat Denies history of Chronic sinus problems/congestion, Middle ear problems Hematologic/Lymphatic Patient has history of Anemia Respiratory Denies history of Aspiration, Asthma, Chronic Obstructive Pulmonary Disease (COPD), Pneumothorax, Sleep Apnea, Tuberculosis Cardiovascular Patient has history of Coronary Artery Disease, Hypertension Gastrointestinal Denies history of Cirrhosis , Colitis, Crohn s, Hepatitis A, Hepatitis B, Hepatitis C Endocrine Patient has history of Type II Diabetes Genitourinary Patient has history of End Stage Renal Disease - on HD MWF Immunological Denies history of Lupus Erythematosus, Raynaud  s, Scleroderma Integumentary (Skin) Denies history of History of Burn, History of pressure wounds Musculoskeletal Patient has history of Osteoarthritis Neurologic Patient has history of Neuropathy - left foot Patient is treated with Insulin, Oral Agents. Blood sugar is tested. Blood sugar results noted at the following times: Breakfast - 50. Medical And Surgical History Notes Eyes legally blind Cardiovascular 2 stents placed by Dr. Humphrey Rolls, but one is already blocked per patient Gastrointestinal Modi, Mira S. 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 Plan: to take kidney out still waiting on decision. Review of Systems (ROS) Constitutional Symptoms (General Health) The patient has no complaints or symptoms. Eyes Denies complaints or symptoms of Glasses / Contacts. Ear/Nose/Mouth/Throat The patient has no complaints or symptoms. Hematologic/Lymphatic Complains or has symptoms of Bleeding / Clotting Disorders. Respiratory The patient has no complaints or symptoms. Gastrointestinal The patient has no complaints or symptoms. Endocrine The patient has no complaints or symptoms. Genitourinary Complains or has symptoms of Kidney failure/ Dialysis. Immunological The patient has no complaints or symptoms. Integumentary (Skin) Complains or has symptoms of Wounds, Bleeding or bruising tendency. Musculoskeletal Complains or has symptoms of Muscle Pain. Neurologic Complains or has symptoms of Numbness/parasthesias. Psychiatric Denies complaints or symptoms of Anxiety. Objective Constitutional Pulse regular. Respirations normal and unlabored. Afebrile. Vitals Time Taken: 9:10 AM, Height: 65 in, Source: Stated, Weight: 156 lbs, Source: Measured, BMI: 26, Temperature: 98.3 F, Pulse: 70 bpm, Respiratory Rate: 16 breaths/min, Blood Pressure: 130/70 mmHg. Eyes Nonicteric. Reactive to light. Tandy, [name] ([id number]) Ears, Nose, Mouth, and Throat Lips, teeth, and gums WNL.Marland Kitchen Moist mucosa without lesions . Neck supple and nontender. No palpable supraclavicular or cervical adenopathy. Normal sized without goiter. Respiratory WNL. No retractions.. Cardiovascular Pedal Pulses WNL. ABI not done as she has a large blister on her right lower extremity.. No clubbing, cyanosis or edema. Gastrointestinal (GI) Abdomen without masses or tenderness.. No liver or spleen enlargement or tenderness.. Musculoskeletal Adexa without tenderness or enlargement.. Digits and nails w/o clubbing, cyanosis, infection, petechiae, ischemia, or  inflammatory conditions.Marland Kitchen Psychiatric Judgement and insight Intact.. No evidence of depression, anxiety, or agitation.. Integumentary (Hair, Skin) on her right upper back she has an area which shows some pressure injury possibly due to the way she has been resting on that side and also some superficial ulcerations which are like stage II pressure ulcers.. No crepitus or fluctuance. No peri-wound warmth or erythema. No masses.. Wound #1 status is Open. Original cause of wound was Blister. The wound is located on the Right Back. The wound measures 7cm length x 7cm width x 0.1cm depth; 38.485cm^2 area and 3.848cm^3 volume. The wound is limited to skin breakdown. There is no tunneling or undermining noted. There is a small amount of serosanguineous drainage noted. The wound margin is distinct with the outline attached to the wound base. There is medium (34-66%) pink granulation within the wound bed. There is no necrotic tissue within the wound bed. The periwound skin appearance exhibited: Moist. The periwound skin appearance did not exhibit: Callus, Crepitus, Excoriation, Fluctuance, Friable, Induration, Localized Edema, Rash, Scarring, Dry/Scaly, Maceration, Atrophie Blanche, Cyanosis, Ecchymosis, Hemosiderin Staining, Mottled, Pallor, Rubor, Erythema. Periwound temperature was noted as No Abnormality. Wound #2 status is Open. Original cause of wound was Blister. The wound is located on the Right,Anterior Lower Leg. The wound measures 4.5cm length x 3.5cm width x 0.1cm depth; 12.37cm^2 area and 1.237cm^3 volume. The wound is limited to skin breakdown.  There is no tunneling or undermining noted. There is a none present amount of drainage noted. There is no granulation within the wound bed. There is no necrotic tissue within the wound bed. The periwound skin appearance did not exhibit: Callus, Crepitus, Excoriation, Fluctuance, Friable, Induration, Localized Edema, Rash, Scarring, Dry/Scaly,  Maceration, Moist, Atrophie Blanche, Cyanosis, Ecchymosis, Hemosiderin Staining, Mottled, Pallor, Rubor, Erythema. Periwound temperature was noted as No Abnormality. Magistro, [name] ([id number]) On her right upper back she has an area which shows some pressure injury possibly due to the way she has been resting on that side and also some superficial ulcerations which are like stage II pressure ulcers. on her right lower extremity she has a blister with fluid under that and it has been there for about 2 weeks. It needs opening out with a forcep and scissors and letting the fluid down and we will leave the skin intact over this. Assessment Active Problems ICD-10 E11.622 - Type 2 diabetes mellitus with other skin ulcer L89.112 - Pressure ulcer of right upper back, stage 2 S80.821A - Blister (nonthermal), right lower leg, initial encounter N18.6 - End stage renal disease I have recommended we use some Prisma AG on her back for the areas which are open and then cover the rest with a Mepitel dressing. We'll then place an ABD over this and a light dressing so as to offload this area. this dressing should be changed on alternate days As far as the leg goes once the blister has been opened out I believe the skin intact and continue to dress this with a nonadherent dressing and possibly gauze and Tegaderm over this. I have asked her leave this intact for a week if possible. she should come and see me back next week. Procedures Wound #2 Wound #2 is a To be determined located on the Right,Anterior Lower Leg . There was a Skin/Epidermis Open Wound/Selective [id number]) debridement with total area of 15.75 sq cm performed by Kirstine Jacquin, Jackson Latino., MD. with the following instrument(s): Forceps and Scissors to remove Non-Viable tissue/material including Fibrin/Slough and Skin after achieving pain control using Lidocaine 5% topical ointment. A time out was conducted prior to the start of the procedure. There  was no bleeding. The procedure was tolerated well with a pain level of 0 throughout and a pain level of 0 following the procedure. Post Debridement Measurements: 4.5cm length x 3.5cm width x 0.1cm depth; 1.237cm^3 volume. GERALDINE, SANDBERG ([id number]) Plan Wound Cleansing: Wound #1 Right Back: Clean wound with Normal Saline. Wound #2 Right,Anterior Lower Leg: Clean wound with Normal Saline. Anesthetic: Wound #1 Right Back: Topical Lidocaine 4% cream applied to wound bed prior to debridement Wound #2 Right,Anterior Lower Leg: Topical Lidocaine 4% cream applied to wound bed prior to debridement Primary Wound Dressing: Wound #1 Right Back: Prisma Ag Wound #2 Right,Anterior Lower Leg: Other: - Mepitel Secondary Dressing: Wound #1 Right Back: Contact Layer - Mepitel ABD pad - secure with only paper tape Wound #2 Right,Anterior Lower Leg: Dry Gauze Tegaderm Dressing Change Frequency: Wound #1 Right Back: Change dressing every other day. Wound #2 Right,Anterior Lower Leg: Change dressing every week - to be changed next week in the wound clinic Follow-up Appointments: Wound #1 Right Back: Return Appointment in 1 week. Wound #2 Right,Anterior Lower Leg: Return Appointment in 1 week. Off-Loading: Wound #1 Right Back: Turn and reposition every 2 hours I have recommended we use some Prisma AG on her back for the areas which are open and then cover  the rest with a Mepitel dressing. We'll then place an ABD over this and a light dressing so as to offload this area. this dressing should be changed on alternate days Adell, Addy S. ([id number]) As far as the leg goes once the blister has been opened out I believe the skin intact and continue to dress this with a nonadherent dressing and possibly gauze and Tegaderm over this. I have asked her leave this intact for a week if possible. she should come and see me back next week. Electronic Signature(s) Signed: 12/30/2014 11:17:56 AM By:  Junious Dresser RN Signed: 12/31/2014 4:45:54 PM By: [name], [name] Previous Signature: 12/29/2014 4:43:13 PM Version By: [name], [name] Previous Signature: 12/23/2014 12:36:02 PM Version By: [name], [name] Entered By: Junious Dresser on 12/30/2014 11:17:56 Mastrangelo, [name] ([id number]) -------------------------------------------------------------------------------- ROS/PFSH Details Patient Name: KEYIA, MORETTO. Date of Service: 12/23/2014 9:00 AM Medical Record Patient Account Number: [id number] Number: [name], RN, [name], Treating RN: [birth year] (59 y.o. [name] Date of Birth/Sex: Female) Other Clinician: Primary Care Physician: Steele Sizer Treating [name] Referring Physician: Steele Sizer Physician/Extender: Suella Grove in Treatment: 0 Information Obtained From Patient Wound History Do you currently have one or more open woundso Yes How many open wounds do you currently haveo 2 Approximately how long have you had your woundso over year How have you been treating your wound(s) until nowo dey deressing Has your wound(s) ever healed and then re-openedo Yes Have you had any lab work done in the past montho Yes Have you tested positive for an antibiotic resistant organism (MRSA, VRE)o No Have you tested positive for osteomyelitis (bone infection)o No Have you had any tests for circulation on your legso No Eyes Complaints and Symptoms: Negative for: Glasses / Contacts Medical History: Positive for: Glaucoma Past Medical History Notes: legally blind Hematologic/Lymphatic Complaints and Symptoms: Positive for: Bleeding / Clotting Disorders Medical History: Positive for: Anemia Genitourinary Complaints and Symptoms: Positive for: Kidney failure/ Dialysis Medical History: Positive for: End Stage Renal Disease - on HD MWF Integumentary (Skin) Leece, Kristi S. ([id number]) Complaints and Symptoms: Positive for: Wounds; Bleeding or bruising  tendency Medical History: Negative for: History of Burn; History of pressure wounds Musculoskeletal Complaints and Symptoms: Positive for: Muscle Pain Medical History: Positive for: Osteoarthritis Neurologic Complaints and Symptoms: Positive for: Numbness/parasthesias Medical History: Positive for: Neuropathy - left foot Psychiatric Complaints and Symptoms: Negative for: Anxiety Constitutional Symptoms (General Health) Complaints and Symptoms: No Complaints or Symptoms Ear/Nose/Mouth/Throat Complaints and Symptoms: No Complaints or Symptoms Medical History: Negative for: Chronic sinus problems/congestion; Middle ear problems Respiratory Complaints and Symptoms: No Complaints or Symptoms Medical History: Negative for: Aspiration; Asthma; Chronic Obstructive Pulmonary Disease (COPD); Pneumothorax; Sleep Apnea; Tuberculosis Cardiovascular Sagar, CHRISIE JANKOVICH ([id number]) Medical History: Positive for: Coronary Artery Disease; Hypertension Past Medical History Notes: 2 stents placed by Dr. Humphrey Rolls, but one is already blocked per patient Gastrointestinal Complaints and Symptoms: No Complaints or Symptoms Medical History: Negative for: Cirrhosis ; Colitis; Crohnos; Hepatitis A; Hepatitis B; Hepatitis C Past Medical History Notes: hyperthyroidsm Endocrine Complaints and Symptoms: No Complaints or Symptoms Medical History: Positive for: Type II Diabetes Time with diabetes: 30year Treated with: Insulin, Oral agents Blood sugar tested every day: Yes Tested : 3-4x/day Blood sugar testing results: Breakfast: 50 Immunological Complaints and Symptoms: No Complaints or Symptoms Medical History: Negative for: Lupus Erythematosus; Raynaudos; Scleroderma Oncologic Medical History: Past Medical History Notes: renal cancer, froze it, heat it and froze again. but it has come back  per patient. Plan: to take kidney out still waiting on decision. HBO Extended History  Items Eyes: Glaucoma Family and Social History Cancer: Yes - Father, Mother; Diabetes: Yes - Father, Mother; Heart Disease: Yes - Mother; Hereditary Spherocytosis: No; Hypertension: Yes - Mother, Father; Kidney Disease: No; Lung Disease: Yes - Mother; CHRISTLE, NOLTING ([id number]) Seizures: No; Stroke: No; Thyroid Problems: No; Tuberculosis: No; Never smoker; Marital Status - Married; Alcohol Use: Never; Drug Use: No History; Caffeine Use: Daily - tea, cofee; Financial Concerns: No; Food, Clothing or Shelter Needs: No; Support System Lacking: No; Transportation Concerns: No; Advanced Directives: Yes (Not Provided); Patient does not want information on Advanced Directives; Living Will: Yes (Not Provided) Physician Affirmation I have reviewed and agree with the above information. Electronic Signature(s) Signed: 12/23/2014 12:36:02 PM By: [name], [name] Signed: 12/23/2014 3:31:27 PM By: Regan Lemming [name], RN Entered By: [name] on 12/23/2014 09:32:01 Wyrick, [name] ([id number]) -------------------------------------------------------------------------------- SuperBill Details Patient Name: RODNEY, WIGGER. Date of Service: 12/23/2014 Medical Record Number: [id number] Patient Account Number: [id number] Date of Birth/Sex: 1956-01-01 (59 y.o. Female) Treating RN: Primary Care Physician: Steele Sizer Other Clinician: Referring Physician: Steele Sizer Treating Physician/Extender: Frann Rider in Treatment: 0 Diagnosis Coding ICD-10 Codes Code Description [phone] Type 2 diabetes mellitus with other skin ulcer L89.112 Pressure ulcer of right upper back, stage 2 S80.821A Blister (nonthermal), right lower leg, initial encounter N18.6 End stage renal disease Facility Procedures CPT4 Code: 54982641 Description: [id number] - WOUND CARE VISIT-LEV 2 EST PT Modifier: Quantity: 1 CPT4 Code: 40768088 Description: 11031 - DEBRIDE WOUND 1ST 20 SQ CM OR < ICD-10 Description Diagnosis  E11.622 Type 2 diabetes mellitus with other skin ulcer S80.821A Blister (nonthermal), right lower leg, initial encou Modifier: nter Quantity: 1 Physician Procedures CPT4 Code: [id number] Description: 29244 - WC PHYS LEVEL 4 - NEW PT ICD-10 Description Diagnosis E11.622 Type 2 diabetes mellitus with other skin ulcer L89.112 Pressure ulcer of right upper back, stage 2 S80.821A Blister (nonthermal), right lower leg, initial encou N18.6 End  stage renal disease Modifier: nter Quantity: 1 CPT4 Code: [id number] Mikes, Timberlyn S. Description: 97597 - WC PHYS DEBR WO ANESTH 20 SQ CM ICD-10 Description Diagnosis E11.622 Type 2 diabetes mellitus with other skin ulcer S80.821A Blister (nonthermal), right lower leg, initial encou ([id number]) Modifier: nter Quantity: 1 Electronic Signature(s) Signed: 12/23/2014 12:36:02 PM By: [name], [name] Signed: 12/23/2014 4:43:53 PM By: Junious Dresser RN Entered By: Junious Dresser on 12/23/2014 10:06:26

## 2014-12-23 NOTE — Progress Notes (Addendum)
TARRIE, MCMICHEN ([id number]) Visit Report for 12/23/2014 Allergy List Details [name], [name]. Date of Service: 12/23/2014 9:00 AM Medical Record Number: [id number] Patient Account Number: [id number] Date of Birth/Sex: June 29, 1956 (59 y.o. Female) Treating RN: Baruch Gouty, RN, [name], [name] Primary Care Physician: Steele Sizer Other Clinician: Referring Physician: Treating Physician/Extender: Frann Rider in Treatment: 0 Allergies Active Allergies No known Allergies Allergy Notes Electronic Signature([name]) Signed: 12/29/2014 3:19:52 PM By: Regan Lemming [name], RN Entered By: Regan Lemming on 12/29/2014 11:21:15 Schoeller, [name] ([id number]) -------------------------------------------------------------------------------- Arrival Information Details [name]. Date of Service: 12/23/2014 9:00 AM Medical Record Number: [id number] Patient Account Number: [id number] Date of Birth/Sex: 03-06-1956 (59 y.o. Female) Treating RN: Baruch Gouty, RN, [name], [name] Primary Care Physician: Steele Sizer Other Clinician: Referring Physician: Treating Physician/Extender: Frann Rider in Treatment: 0 Visit Information Patient Arrived: Charlyn Minerva Time: 09:04 Accompanied By: self Transfer Assistance: None Patient Identification Verified: Yes Secondary Verification Process Yes Completed: Patient Requires Transmission- No Based Precautions: Patient Has Alerts: Yes Patient Alerts: Patient on Blood Thinner Electronic Signature([name]) Signed: 12/23/2014 3:31:27 PM By: Regan Lemming [name], RN Entered By: Regan Lemming on 12/23/2014 09:11:05 Moncur, [name] ([id number]) -------------------------------------------------------------------------------- Clinic Level of Care Assessment Details [name]. Date of Service: 12/23/2014 9:00 AM Medical Record Number: [id number] Patient Account Number: [id number] Date of Birth/Sex: [birth year] (59 y.o. Female) Treating RN: Junious Dresser Primary Care Physician: Steele Sizer Other Clinician: Referring Physician: Treating Physician/Extender: Frann Rider in Treatment: 0 Clinic Level of Care Assessment Items TOOL 1 Quantity Score []  - Use when EandM and Procedure is performed on INITIAL visit 0 ASSESSMENTS - Nursing Assessment / Reassessment []  - General Physical Exam (combine w/ comprehensive assessment (listed just 0 below) when performed on new pt. evals) X - Comprehensive Assessment (HX, ROS, Risk Assessments, Wounds Hx, etc.) 1 25 ASSESSMENTS - Wound and Skin Assessment / Reassessment []  - Dermatologic / Skin Assessment (not related to wound area) 0 ASSESSMENTS - Ostomy and/or Continence Assessment and Care []  - Incontinence Assessment and Management 0 []  - Ostomy Care Assessment and Management (repouching, etc.) 0 PROCESS - Coordination of Care X - Simple Patient / Family Education for ongoing care 1 15 []  - Complex (extensive) Patient / Family Education for ongoing care 0 X - Staff obtains Programmer, systems, Records, Test Results / Process Orders 1 10 []  - Staff telephones HHA, Nursing Homes / Clarify orders / etc 0 []  - Routine Transfer to another Facility (non-emergent condition) 0 []  - Routine Hospital Admission (non-emergent condition) 0 X - New Admissions / Biomedical engineer / Ordering NPWT, Apligraf, etc. 1 15 []  - Emergency Hospital Admission (emergent condition) 0 PROCESS - Special Needs []  - Pediatric / Minor Patient Management 0 []  - Isolation Patient Management 0 Wiemers, Raymonde [name]. ([id number]) []  - Hearing / Language / Visual special needs 0 []  - Assessment of Community assistance (transportation, D/C planning, etc.) 0 []  - Additional assistance / Altered mentation 0 []  - Support Surface([name]) Assessment (bed, cushion, seat, etc.) 0 INTERVENTIONS - Miscellaneous []  - External ear exam 0 []  - Patient Transfer (multiple staff / Civil Service fast streamer / Similar devices) 0 []  - Simple Staple / Suture  removal (25 or less) 0 []  - Complex Staple / Suture removal (26 or more) 0 []  - Hypo/Hyperglycemic Management (do not check if billed separately) 0 []  - Ankle / Brachial Index (ABI) - do not check if billed separately 0 Has the patient been seen at the hospital  within the last three years: Yes Total Score: 65 Level Of Care: New/Established - Level 2 Electronic Signature([name]) Signed: 12/23/2014 4:43:53 PM By: Junious Dresser RN Entered By: Junious Dresser on 12/23/2014 10:06:11 Stonehouse, [name] ([id number]) -------------------------------------------------------------------------------- Encounter Discharge Information Details [name]. Date of Service: 12/23/2014 9:00 AM Medical Record Number: [id number] Patient Account Number: [id number] Date of Birth/Sex: 16-Dec-1955 (59 y.o. Female) Treating RN: Primary Care Physician: Steele Sizer Other Clinician: Referring Physician: Treating Physician/Extender: Frann Rider in Treatment: 0 Encounter Discharge Information Items Schedule Follow-up Appointment: No Medication Reconciliation completed No and provided to Patient/Care Sadler Teschner: Provided on Clinical Summary of Care: 12/23/2014 Form Type Recipient Paper Patient LD Electronic Signature([name]) Signed: 12/23/2014 10:07:30 AM By: Ruthine Dose Entered By: Ruthine Dose on 12/23/2014 10:07:30 Stanko, [name] ([id number]) -------------------------------------------------------------------------------- Lower Extremity Assessment Details [name]. Date of Service: 12/23/2014 9:00 AM Medical Record Number: [id number] Patient Account Number: [id number] Date of Birth/Sex: 28-Jul-1955 (59 y.o. Female) Treating RN: Baruch Gouty, RN, [name], [name] Primary Care Physician: Steele Sizer Other Clinician: Referring Physician: Treating Physician/Extender: Frann Rider in Treatment: 0 Edema Assessment Assessed: [Left: No] [Right: No] Edema: [Left: N] [Right: o] Vascular  Assessment Claudication: Claudication Assessment [Right:None] Pulses: Posterior Tibial Palpable: [Right:Yes] Dorsalis Pedis Palpable: [Right:Yes] Extremity colors, hair growth, and conditions: Extremity Color: [Right:Normal] Hair Growth on Extremity: [Right:Yes] Temperature of Extremity: [Right:Warm] Capillary Refill: [Right:< 3 seconds] Dependent Rubor: [Right:No] Blanched when Elevated: [Right:No] Lipodermatosclerosis: [Right:No] Toe Nail Assessment Left: Right: Thick: No Discolored: No Deformed: No Improper Length and Hygiene: No Notes ABI not completed due to blistered area and patient verbalized discomfort. Nurse didn't want to burst blister with blood pressure cuff. Electronic Signature([name]) Signed: 12/29/2014 3:19:52 PM By: Regan Lemming [name], RN Previous Signature: 12/23/2014 3:31:27 PM Version By: Regan Lemming [name], RN Baham, [name] ([id number]) Entered By: Regan Lemming on 12/29/2014 11:23:02 Bornemann, [name] ([id number]) -------------------------------------------------------------------------------- Multi Wound Chart Details Patient Name: AMARIS, DELAFUENTE. Date of Service: 12/23/2014 9:00 AM Medical Record Number: [id number] Patient Account Number: [id number] Date of Birth/Sex: Dec 10, 1955 (59 y.o. Female) Treating RN: Junious Dresser Primary Care Physician: Steele Sizer Other Clinician: Referring Physician: Treating Physician/Extender: Frann Rider in Treatment: 0 Vital Signs Height(in): 65 Pulse(bpm): 70 Weight(lbs): 156 Blood Pressure 130/70 (mmHg): Body Mass Index(BMI): 26 Temperature(F): 98.3 Respiratory Rate 16 (breaths/min): Photos: [1:No Photos] [2:No Photos] [N/A:N/A] Wound Location: [1:Right Back] [2:Right Lower Leg - Anterior N/A] Wounding Event: [1:Blister] [2:Blister] [N/A:N/A] Primary Etiology: [1:2nd degree Burn] [2:To be determined] [N/A:N/A] Comorbid History: [1:Glaucoma, Anemia, Coronary Artery Disease, Coronary Artery Disease,  Hypertension, Type II Diabetes, End Stage Renal Disease, Osteoarthritis, Neuropathy Osteoarthritis, Neuropathy] [2:Glaucoma, Anemia, Hypertension, Type II Diabetes,  End Stage Renal Disease,] [N/A:N/A] Date Acquired: [1:10/19/2013] [2:11/18/2014] [N/A:N/A] Weeks of Treatment: [1:0] [2:0] [N/A:N/A] Wound Status: [1:Open] [2:Open] [N/A:N/A] Measurements L x W x D 7x7x0.1 [2:4.5x3.5x0.1] [N/A:N/A] (cm) Area (cm) : [1:38.485] [2:12.37] [N/A:N/A] Volume (cm) : [1:3.848] [2:1.237] [N/A:N/A] % Reduction in Area: [1:0.00%] [2:0.00%] [N/A:N/A] % Reduction in Volume: 0.00% [2:0.00%] [N/A:N/A] Classification: [1:Partial Thickness] [2:Unclassifiable] [N/A:N/A] HBO Classification: [1:N/A] [2:Unable to visualize wound N/A bed] Exudate Amount: [1:Small] [2:None Present] [N/A:N/A] Exudate Type: [1:Serosanguineous] [2:N/A] [N/A:N/A] Exudate Color: [1:red, brown] [2:N/A] [N/A:N/A] Wound Margin: [1:Distinct, outline attached N/A] [N/A:N/A] Granulation Amount: [1:Medium (34-66%)] [2:None Present (0%)] [N/A:N/A] Granulation Quality: [1:Pink] [2:N/A] [N/A:N/A] Necrotic Amount: [1:None Present (0%)] [2:None Present (0%)] [N/A:N/A] Exposed Structures: [N/A:N/A] Fascia: No Fascia: No Fat: No Fat: No Tendon: No Tendon: No Muscle: No Muscle: No Joint: No Joint: No  Bone: No Bone: No Limited to Skin Limited to Skin Breakdown Breakdown Epithelialization: Medium (34-66%) None N/A Periwound Skin Texture: Edema: No Edema: No N/A Excoriation: No Excoriation: No Induration: No Induration: No Callus: No Callus: No Crepitus: No Crepitus: No Fluctuance: No Fluctuance: No Friable: No Friable: No Rash: No Rash: No Scarring: No Scarring: No Periwound Skin Moist: Yes Maceration: No N/A Moisture: Maceration: No Moist: No Dry/Scaly: No Dry/Scaly: No Periwound Skin Color: Atrophie Blanche: No Atrophie Blanche: No N/A Cyanosis: No Cyanosis: No Ecchymosis: No Ecchymosis: No Erythema: No Erythema:  No Hemosiderin Staining: No Hemosiderin Staining: No Mottled: No Mottled: No Pallor: No Pallor: No Rubor: No Rubor: No Temperature: No Abnormality No Abnormality N/A Tenderness on No No N/A Palpation: Wound Preparation: Ulcer Cleansing: Ulcer Cleansing: Not N/A Rinsed/Irrigated with Cleansed Saline Topical Anesthetic Topical Anesthetic Applied: Other: lidocaine Applied: Other: lidocaine 4% 4% Treatment Notes Electronic Signature([name]) Signed: 12/23/2014 4:43:53 PM By: Junious Dresser RN Entered By: Junious Dresser on 12/23/2014 09:45:55 Sansom, [name] ([id number]) -------------------------------------------------------------------------------- [place] Details [name]. Date of Service: 12/23/2014 9:00 AM Medical Record Number: [id number] Patient Account Number: [id number] Date of Birth/Sex: 1956-02-16 (59 y.o. Female) Treating RN: Junious Dresser Primary Care Physician: Steele Sizer Other Clinician: Referring Physician: Treating Physician/Extender: Frann Rider in Treatment: 0 Active Inactive Abuse / Safety / Falls / Self Care Management Nursing Diagnoses: Potential for falls Goals: Patient will remain injury free Date Initiated: 12/23/2014 Goal Status: Active Patient/caregiver will verbalize understanding of skin care regimen Date Initiated: 12/23/2014 Goal Status: Active Patient/caregiver will verbalize/demonstrate measures taken to prevent injury and/or falls Date Initiated: 12/23/2014 Goal Status: Active Patient/caregiver will verbalize/demonstrate understanding of what to do in case of emergency Date Initiated: 12/23/2014 Goal Status: Active Interventions: Assess fall risk on admission and as needed Assess: immobility, friction, shearing, incontinence upon admission and as needed Assess impairment of mobility on admission and as needed per policy Assess self care needs on admission and as needed Provide education on fall  prevention Notes: Orientation to the Wound Care Program Nursing Diagnoses: Knowledge deficit related to the wound healing center program Goals: Patient/caregiver will verbalize understanding of the [facility] Date Initiated: 12/23/2014 [name] ([id number]) Goal Status: Active Interventions: Provide education on orientation to the wound center Notes: Wound/Skin Impairment Nursing Diagnoses: Impaired tissue integrity Knowledge deficit related to ulceration/compromised skin integrity Goals: Patient/caregiver will verbalize understanding of skin care regimen Date Initiated: 12/23/2014 Goal Status: Active Ulcer/skin breakdown will heal within 14 weeks Date Initiated: 12/23/2014 Goal Status: Active Interventions: Assess patient/caregiver ability to obtain necessary supplies Assess patient/caregiver ability to perform ulcer/skin care regimen upon admission and as needed Assess ulceration([name]) every visit Provide education on ulcer and skin care Treatment Activities: Skin care regimen initiated : 12/23/2014 Topical wound management initiated : 12/23/2014 Notes: Electronic Signature([name]) Signed: 12/23/2014 4:43:53 PM By: Junious Dresser RN Entered By: Junious Dresser on 12/23/2014 09:44:59 Spruell, Raynee [name]. ([id number]) -------------------------------------------------------------------------------- Pain Assessment Details [name]. Date of Service: 12/23/2014 9:00 AM Medical Record Number: [id number] Patient Account Number: [id number] Date of Birth/Sex: 1956-04-11 (59 y.o. Female) Treating RN: Baruch Gouty, RN, [name], [name] Primary Care Physician: Steele Sizer Other Clinician: Referring Physician: Treating Physician/Extender: Frann Rider in Treatment: 0 Active Problems Location of Pain Severity and Description of Pain Patient Has Paino No Site Locations Pain Management and Medication Current Pain Management: Electronic Signature([name]) Signed: 12/23/2014  3:31:27 PM By: Regan Lemming [name], RN Entered By: Regan Lemming on 12/23/2014 09:11:11  SHELSY, SENG ([id number]) -------------------------------------------------------------------------------- Patient/Caregiver Education Details Patient Name: [name], Shatora [name]. Date of Service: 12/23/2014 9:00 AM Medical Record Number: [id number] Patient Account Number: [id number] Date of Birth/Gender: [birth year] (59 y.o. Female) Treating RN: Junious Dresser Primary Care Physician: Steele Sizer Other Clinician: Referring Physician: Treating Physician/Extender: Frann Rider in Treatment: 0 Education Assessment Education Provided To: Patient Education Topics Provided Basic Hygiene: Methods: Explain/Verbal Responses: State content correctly Offloading: Methods: Explain/Verbal Responses: State content correctly Pressure: Methods: Explain/Verbal Responses: State content correctly Safety: Methods: Explain/Verbal Responses: State content correctly Welcome To [facility]: Methods: Explain/Verbal Responses: State content correctly Wound Debridement: Methods: Explain/Verbal Responses: State content correctly Wound/Skin Impairment: Methods: Explain/Verbal Responses: State content correctly Electronic Signature([name]) Signed: 12/23/2014 4:43:53 PM By: Junious Dresser RN Entered By: Junious Dresser on 12/23/2014 09:53:39 Trumbull, Bryelle [name]. ([id number]) Santmyer, Anayah [name]. ([id number]) -------------------------------------------------------------------------------- Wound Assessment Details Patient Name: Sica, Franca [name]. Date of Service: 12/23/2014 9:00 AM Medical Record Number: [id number] Patient Account Number: [id number] Date of Birth/Sex: [birth year] (59 y.o. Female) Treating RN: [name], RN, [name], [place] Primary Care Physician: Steele Sizer Other Clinician: Referring Physician: Treating Physician/Extender: Frann Rider in Treatment: 0 Wound Status Wound Number: 1 Primary Pressure  Ulcer Etiology: Wound Location: Right Back Wound Open Wounding Event: Blister Status: Date Acquired: 10/19/2013 Comorbid Glaucoma, Anemia, Coronary Artery Weeks Of Treatment: 0 History: Disease, Hypertension, Type II Clustered Wound: No Diabetes, End Stage Renal Disease, Osteoarthritis, Neuropathy Photos Wound Measurements Length: (cm) 7 Width: (cm) 7 Depth: (cm) 0.1 Area: (cm) 38.485 Volume: (cm) 3.848 % Reduction in Area: 0% % Reduction in Volume: 0% Epithelialization: Medium (34-66%) Tunneling: No Undermining: No Wound Description Classification: Category/Stage II Wound Margin: Distinct, outline attached Exudate Amount: Small Exudate Type: Serosanguineous Exudate Color: red, brown Foul Odor After Cleansing: No Wound Bed Granulation Amount: Medium (34-66%) Exposed Structure Granulation Quality: Pink Fascia Exposed: No Necrotic Amount: None Present (0%) Fat Layer Exposed: No Tendon Exposed: No Paladino, Mayline [name]. ([id number]) Muscle Exposed: No Joint Exposed: No Bone Exposed: No Limited to Skin Breakdown Periwound Skin Texture Texture Color No Abnormalities Noted: No No Abnormalities Noted: No Callus: No Atrophie Blanche: No Crepitus: No Cyanosis: No Excoriation: No Ecchymosis: No Fluctuance: No Erythema: No Friable: No Hemosiderin Staining: No Induration: No Mottled: No Localized Edema: No Pallor: No Rash: No Rubor: No Scarring: No Temperature / Pain Moisture Temperature: No Abnormality No Abnormalities Noted: No Dry / Scaly: No Maceration: No Moist: Yes Wound Preparation Ulcer Cleansing: Rinsed/Irrigated with Saline Topical Anesthetic Applied: Other: lidocaine 4%, Electronic Signature([name]) Signed: 12/29/2014 9:58:52 AM By: Gretta Cool RN, [name], Kim RN, [name] Signed: 12/29/2014 3:19:52 PM By: Regan Lemming [name], RN Previous Signature: 12/23/2014 3:31:27 PM Version By: Regan Lemming [name], RN Entered By: Gretta Cool, RN, [name], Kim on 12/29/2014 09:58:52 Frysinger, [name] ([id number]) -------------------------------------------------------------------------------- Wound Assessment Details Patient Name: ELENER, CUSTODIO. Date of Service: 12/23/2014 9:00 AM Medical Record Number: [id number] Patient Account Number: [id number] Date of Birth/Sex: Oct 09, 1955 (59 y.o. Female) Treating RN: Baruch Gouty, RN, [name], [name] Primary Care Physician: Steele Sizer Other Clinician: Referring Physician: Treating Physician/Extender: Frann Rider in Treatment: 0 Wound Status Wound Number: 2 Primary To be determined Etiology: Wound Location: Right Lower Leg - Anterior Wound Open Wounding Event: Blister Status: Date Acquired: 11/18/2014 Comorbid Glaucoma, Anemia, Coronary Artery Weeks Of Treatment: 0 History: Disease, Hypertension, Type II Clustered Wound: No Diabetes, End Stage Renal Disease, Osteoarthritis, Neuropathy Photos Photo Uploaded By: Regan Lemming on 12/23/2014 11:49:10 Wound Measurements Length: (cm) 4.5 Width: (cm) 3.5 Depth: (cm)  0.1 Area: (cm) 12.37 Volume: (cm) 1.237 % Reduction in Area: 0% % Reduction in Volume: 0% Epithelialization: None Tunneling: No Undermining: No Wound Description Classification: Unclassifiable Diabetic Severity Unable to visualize wound Earleen Newport): bed Exudate Amount: None Present Foul Odor After Cleansing: No Wound Bed Granulation Amount: None Present (0%) Exposed Structure Necrotic Amount: None Present (0%) Fascia Exposed: No Fat Layer Exposed: No Tendon Exposed: No Azer, Deina [name]. ([id number]) Muscle Exposed: No Joint Exposed: No Bone Exposed: No Limited to Skin Breakdown Periwound Skin Texture Texture Color No Abnormalities Noted: No No Abnormalities Noted: No Callus: No Atrophie Blanche: No Crepitus: No Cyanosis: No Excoriation: No Ecchymosis: No Fluctuance: No Erythema: No Friable: No Hemosiderin Staining: No Induration: No Mottled: No Localized Edema: No Pallor: No Rash: No Rubor:  No Scarring: No Temperature / Pain Moisture Temperature: No Abnormality No Abnormalities Noted: No Dry / Scaly: No Maceration: No Moist: No Wound Preparation Ulcer Cleansing: Not Cleansed Topical Anesthetic Applied: Other: lidocaine 4%, Electronic Signature([name]) Signed: 12/23/2014 3:31:27 PM By: Regan Lemming [name], RN Entered By: Regan Lemming on 12/23/2014 09:27:52 Mcdougall, [name] ([id number]) -------------------------------------------------------------------------------- Vitals Details [name]. Date of Service: 12/23/2014 9:00 AM Medical Record Number: [id number] Patient Account Number: [id number] Date of Birth/Sex: May 22, 1956 (59 y.o. Female) Treating RN: [name], RN, [name], [place] Primary Care Physician: Steele Sizer Other Clinician: Referring Physician: Treating Physician/Extender: Frann Rider in Treatment: 0 Vital Signs Time Taken: 09:10 Temperature (F): 98.3 Height (in): 65 Pulse (bpm): 70 Source: Stated Respiratory Rate (breaths/min): 16 Weight (lbs): 156 Blood Pressure (mmHg): 130/70 Source: Measured Reference Range: 80 - 120 mg / dl Body Mass Index (BMI): 26 Electronic Signature([name]) Signed: 12/23/2014 3:31:27 PM By: Regan Lemming [name], RN Entered By: Regan Lemming on 12/23/2014 09:13:18

## 2014-12-23 NOTE — Progress Notes (Signed)
[name] ([id number]) Visit Report for 12/23/2014 Abuse/Suicide Risk Screen Details [name]. Date of Service: 12/23/2014 9:00 AM Medical Record Patient Account Number: [id number] Number: [name], RN, [name], Treating RN: 1955/11/13 (59 y.o. [name] Date of Birth/Sex: Female) Other Clinician: Primary Care Physician: Steele Sizer Treating Britto, Errol Referring Physician: Physician/Extender: Suella Grove in Treatment: 0 Abuse/Suicide Risk Screen Items Answer ABUSE/SUICIDE RISK SCREEN: Has anyone close to you tried to hurt or harm you recentlyo No Do you feel uncomfortable with anyone in your familyo No Has anyone forced you do things that you didnot want to doo No Do you have any thoughts of harming yourselfo No Patient displays signs or symptoms of abuse and/or neglect. No Electronic Signature(s) Signed: 12/23/2014 3:31:27 PM By: Regan Lemming [name], RN Entered By: Regan Lemming on 12/23/2014 09:16:51 Kovacich, [name] ([id number]) -------------------------------------------------------------------------------- Activities of Daily Living Details [name]. Date of Service: 12/23/2014 9:00 AM Medical Record Patient Account Number: [id number] Number: [name], RN, [name], Treating RN: July 30, 1955 (59 y.o. [name] Date of Birth/Sex: Female) Other Clinician: Primary Care Physician: Steele Sizer Treating [name] Referring Physician: Physician/Extender: Suella Grove in Treatment: 0 Activities of Daily Living Items Answer Activities of Daily Living (Please select one for each item) Drive Automobile Need Assistance Take Medications Need Assistance Use Telephone Completely Able Care for Appearance Completely Able Use Toilet Completely Able Bath / Shower Completely Able Dress Self Completely Able Feed Self Completely Able Walk Need Assistance Get In / Out Bed Completely Able Housework Need Assistance Prepare Meals Completely [place] for Self Completely Able Electronic Signature(s) Signed: 12/23/2014 3:31:27 PM By: Regan Lemming [name], RN Entered By: Regan Lemming on 12/23/2014 09:16:39 Hemann, [name] ([id number]) -------------------------------------------------------------------------------- Education Assessment Details [name]. Date of Service: 12/23/2014 9:00 AM Medical Record Patient Account Number: [id number] Number: [name], RN, [name], Treating RN: Apr 25, 1956 (59 y.o. [name] Date of Birth/Sex: Female) Other Clinician: Primary Care Physician: Steele Sizer Treating [name] Referring Physician: Physician/Extender: Suella Grove in Treatment: 0 Primary Learner Assessed: Patient Learning Preferences/Education Level/Primary Language Learning Preference: Explanation Highest Education Level: High School Preferred Language: English Cognitive Barrier Assessment/Beliefs Language Barrier: No Physical Barrier Assessment Impaired Vision: Yes Legally Blind Impaired Hearing: No Decreased Hand dexterity: No Knowledge/Comprehension Assessment Knowledge Level: High Comprehension Level: High Ability to understand written High instructions: Ability to understand verbal High instructions: Motivation Assessment Anxiety Level: Calm Cooperation: Cooperative Education Importance: Acknowledges Need Interest in Health Problems: Asks Questions Perception: Coherent Willingness to Engage in Self- High Management Activities: Readiness to Engage in Self- High Management Activities: Electronic Signature(s) Signed: 12/23/2014 3:31:27 PM By: Regan Lemming [name], RN Entered By: Regan Lemming on 12/23/2014 09:16:15 Lucks, [name] ([id number]) Fenley, [name] ([id number]) -------------------------------------------------------------------------------- Fall Risk Assessment Details Patient Name: [name], [name] Date of Service: 12/23/2014 9:00 AM Medical Record Patient Account Number:  [id number] Number: [name], RN, [name], Treating RN: 1956/06/11 (59 y.o. [name] Date of Birth/Sex: Female) Other Clinician: Primary Care Physician: Steele Sizer Treating [name] Referring Physician: Physician/Extender: Suella Grove in Treatment: 0 Fall Risk Assessment Items FALL RISK ASSESSMENT: History of falling - immediate or within 3 months 0 No Secondary diagnosis 0 No Ambulatory aid None/bed rest/wheelchair/nurse 0 No Crutches/cane/walker 15 Yes Furniture 0 No IV Access/Saline Lock 0 No Gait/Training Normal/bed rest/immobile 0 No Weak 10 Yes Impaired 0 No Mental Status Oriented to own ability 0 Yes Electronic Signature(s) Signed: 12/23/2014 3:31:27 PM By: Regan Lemming [name], RN Entered By:  Regan Lemming on 12/23/2014 09:15:28 Hoopingarner, [name] ([id number]) -------------------------------------------------------------------------------- Foot Assessment Details Patient Name: [name], [name] Date of Service: 12/23/2014 9:00 AM Medical Record Patient Account Number: [id number] Number: [name], RN, [name], Treating RN: 26-Jan-1956 (59 y.o. [name] Date of Birth/Sex: Female) Other Clinician: Primary Care Physician: Steele Sizer Treating Britto, Errol Referring Physician: Physician/Extender: Suella Grove in Treatment: 0 Foot Assessment Items Site Locations + = Sensation present, - = Sensation absent, C = Callus, U = Ulcer R = Redness, W = Warmth, M = Maceration, PU = Pre-ulcerative lesion F = Fissure, S = Swelling, D = Dryness Assessment Right: Left: Other Deformity: No No Prior Foot Ulcer: No No Prior Amputation: No No Charcot Joint: No No Ambulatory Status: Ambulatory With Help Assistance Device: Walker Gait: Administrator, arts) Signed: 12/23/2014 3:31:27 PM By: Regan Lemming [name], RN Entered By: Regan Lemming on 12/23/2014 09:14:57 Bouwman, [name] ([id number]) Rajan, [name]  ([id number]) -------------------------------------------------------------------------------- Nutrition Risk Assessment Details Patient Name: [name], [name] Date of Service: 12/23/2014 9:00 AM Medical Record Patient Account Number: [id number] Number: [name], RN, [name], Treating RN: 07-09-1956 (59 y.o. [name] Date of Birth/Sex: Female) Other Clinician: Primary Care Physician: Steele Sizer Treating Britto, Errol Referring Physician: Physician/Extender: Suella Grove in Treatment: 0 Height (in): 65 Weight (lbs): 156 Body Mass Index (BMI): 26 Nutrition Risk Assessment Items NUTRITION RISK SCREEN: I have an illness or condition that made me change the kind and/or 0 No amount of food I eat I eat fewer than two meals per day 0 No I eat few fruits and vegetables, or milk products 0 No I have three or more drinks of beer, liquor or wine almost every day 0 No I have tooth or mouth problems that make it hard for me to eat 0 No I don't always have enough money to buy the food I need 0 No I eat alone most of the time 0 No I take three or more different prescribed or over-the-counter drugs a 0 No day Without wanting to, I have lost or gained 10 pounds in the last six 0 No months I am not always physically able to shop, cook and/or feed myself 0 No Nutrition Protocols Good Risk Protocol 0 No interventions needed Moderate Risk Protocol Electronic Signature(s) Signed: 12/23/2014 3:31:27 PM By: Regan Lemming [name], RN Entered By: Regan Lemming on 12/23/2014 09:15:06

## 2014-12-23 NOTE — Telephone Encounter (Signed)
Patient lost looking for wcc called courtesy car to pick her up

## 2014-12-24 DIAGNOSIS — E119 Type 2 diabetes mellitus without complications: Secondary | ICD-10-CM | POA: Diagnosis not present

## 2014-12-24 DIAGNOSIS — N186 End stage renal disease: Secondary | ICD-10-CM | POA: Diagnosis present

## 2014-12-24 DIAGNOSIS — E785 Hyperlipidemia, unspecified: Secondary | ICD-10-CM | POA: Diagnosis not present

## 2014-12-24 DIAGNOSIS — Z992 Dependence on renal dialysis: Secondary | ICD-10-CM | POA: Diagnosis not present

## 2014-12-24 DIAGNOSIS — I509 Heart failure, unspecified: Secondary | ICD-10-CM | POA: Diagnosis not present

## 2014-12-24 DIAGNOSIS — I120 Hypertensive chronic kidney disease with stage 5 chronic kidney disease or end stage renal disease: Secondary | ICD-10-CM | POA: Diagnosis not present

## 2014-12-24 DIAGNOSIS — Z79899 Other long term (current) drug therapy: Secondary | ICD-10-CM | POA: Diagnosis not present

## 2014-12-24 DIAGNOSIS — Z7982 Long term (current) use of aspirin: Secondary | ICD-10-CM | POA: Diagnosis not present

## 2014-12-24 NOTE — OR Nursing (Signed)
Telephone call to patient and reviewed instructions. She still has paperwork and verbalized understanding

## 2014-12-25 ENCOUNTER — Ambulatory Visit: Payer: Medicare Other | Admitting: Anesthesiology

## 2014-12-25 ENCOUNTER — Encounter: Payer: Self-pay | Admitting: *Deleted

## 2014-12-25 ENCOUNTER — Encounter
Admission: RE | Disposition: A | Discharge status: Skilled Nursing Facility | Payer: Self-pay | Source: Ambulatory Visit | Attending: Vascular Surgery

## 2014-12-25 ENCOUNTER — Ambulatory Visit
Admission: RE | Admit: 2014-12-25 | Discharge: 2014-12-25 | Disposition: A | Discharge status: Skilled Nursing Facility | Payer: Medicare Other | Source: Ambulatory Visit | Attending: Vascular Surgery | Admitting: Vascular Surgery

## 2014-12-25 DIAGNOSIS — N186 End stage renal disease: Secondary | ICD-10-CM | POA: Insufficient documentation

## 2014-12-25 DIAGNOSIS — I120 Hypertensive chronic kidney disease with stage 5 chronic kidney disease or end stage renal disease: Secondary | ICD-10-CM | POA: Insufficient documentation

## 2014-12-25 DIAGNOSIS — Z992 Dependence on renal dialysis: Secondary | ICD-10-CM | POA: Insufficient documentation

## 2014-12-25 DIAGNOSIS — E119 Type 2 diabetes mellitus without complications: Secondary | ICD-10-CM | POA: Insufficient documentation

## 2014-12-25 DIAGNOSIS — E785 Hyperlipidemia, unspecified: Secondary | ICD-10-CM | POA: Insufficient documentation

## 2014-12-25 DIAGNOSIS — Z7982 Long term (current) use of aspirin: Secondary | ICD-10-CM | POA: Insufficient documentation

## 2014-12-25 DIAGNOSIS — Z79899 Other long term (current) drug therapy: Secondary | ICD-10-CM | POA: Insufficient documentation

## 2014-12-25 DIAGNOSIS — I509 Heart failure, unspecified: Secondary | ICD-10-CM | POA: Insufficient documentation

## 2014-12-25 HISTORY — PX: AV FISTULA PLACEMENT: SHX1204

## 2014-12-25 LAB — BASIC METABOLIC PANEL
ANION GAP: 10 (ref 5–15)
BUN: 33 mg/dL — ABNORMAL HIGH (ref 6–20)
CALCIUM: 9.2 mg/dL (ref 8.9–10.3)
CHLORIDE: 95 mmol/L — AB (ref 101–111)
CO2: 33 mmol/L — AB (ref 22–32)
CREATININE: 3.78 mg/dL — AB (ref 0.44–1.00)
GFR calc non Af Amer: 12 mL/min — ABNORMAL LOW (ref >60)
GFR, EST AFRICAN AMERICAN: 14 mL/min — AB (ref >60)
Glucose, Bld: 221 mg/dL — ABNORMAL HIGH (ref 65–99)
Potassium: 4.3 mmol/L (ref 3.5–5.1)
SODIUM: 138 mmol/L (ref 135–145)

## 2014-12-25 LAB — CBC
HEMATOCRIT: 37.4 % (ref 35.0–47.0)
HEMOGLOBIN: 12.3 g/dL (ref 12.0–16.0)
MCH: 29.8 pg (ref 26.0–34.0)
MCHC: 32.9 g/dL (ref 32.0–36.0)
MCV: 90.6 fL (ref 80.0–100.0)
PLATELETS: 185 10*3/uL (ref 150–440)
RBC: 4.13 MIL/uL (ref 3.80–5.20)
RDW: 15.8 % — ABNORMAL HIGH (ref 11.5–14.5)
WBC: 7.6 10*3/uL (ref 3.6–11.0)

## 2014-12-25 LAB — TYPE AND SCREEN
ABO/RH(D): O POS
Antibody Screen: NEGATIVE

## 2014-12-25 LAB — DIFFERENTIAL
Basophils Absolute: 0.1 10*3/uL (ref 0–0.1)
Basophils Relative: 1 %
EOS ABS: 0.4 10*3/uL (ref 0–0.7)
Eosinophils Relative: 6 %
LYMPHS PCT: 25 %
Lymphs Abs: 1.9 10*3/uL (ref 1.0–3.6)
MONO ABS: 0.5 10*3/uL (ref 0.2–0.9)
Monocytes Relative: 7 %
Neutro Abs: 4.6 10*3/uL (ref 1.4–6.5)
Neutrophils Relative %: 61 %

## 2014-12-25 LAB — GLUCOSE, CAPILLARY: Glucose-Capillary: 141 mg/dL — ABNORMAL HIGH (ref 65–99)

## 2014-12-25 SURGERY — ARTERIOVENOUS (AV) FISTULA CREATION
Anesthesia: General | Laterality: Left | Wound class: Clean

## 2014-12-25 MED ORDER — HEPARIN SODIUM (PORCINE) 1000 UNIT/ML IJ SOLN
INTRAMUSCULAR | Status: DC | PRN
  Administered 2014-12-25: 3 mL via INTRAVENOUS

## 2014-12-25 MED ORDER — PHENYLEPHRINE HCL 10 MG/ML IJ SOLN
INTRAMUSCULAR | Status: DC | PRN
  Administered 2014-12-25 (×4): 100 ug via INTRAVENOUS

## 2014-12-25 MED ORDER — KETAMINE HCL 50 MG/ML IJ SOLN
INTRAMUSCULAR | Status: DC | PRN
  Administered 2014-12-25: 25 mg via INTRAMUSCULAR

## 2014-12-25 MED ORDER — ONDANSETRON HCL 4 MG/2ML IJ SOLN: 4.0000 mg | Freq: Once | INTRAMUSCULAR | Status: DC | PRN

## 2014-12-25 MED ORDER — FENTANYL CITRATE (PF) 100 MCG/2ML IJ SOLN
INTRAMUSCULAR | Status: DC | PRN
  Administered 2014-12-25 (×2): 25 ug via INTRAVENOUS
  Administered 2014-12-25: 50 ug via INTRAVENOUS

## 2014-12-25 MED ORDER — ONDANSETRON HCL 4 MG/2ML IJ SOLN
INTRAMUSCULAR | Status: DC | PRN
  Administered 2014-12-25: 4 mg via INTRAVENOUS

## 2014-12-25 MED ORDER — GLYCOPYRROLATE 0.2 MG/ML IJ SOLN
INTRAMUSCULAR | Status: DC | PRN
  Administered 2014-12-25: .2 mg via INTRAVENOUS

## 2014-12-25 MED ORDER — HYDROCODONE-ACETAMINOPHEN 5-325 MG PO TABS
1.0000 | ORAL_TABLET | Freq: Four times a day (QID) | ORAL | Status: DC | PRN
  Administered 2014-12-25: 1 via ORAL

## 2014-12-25 MED ORDER — FENTANYL CITRATE (PF) 100 MCG/2ML IJ SOLN
25.0000 ug | INTRAMUSCULAR | Status: DC | PRN
  Administered 2014-12-25 (×4): 25 ug via INTRAVENOUS

## 2014-12-25 MED ORDER — HEPARIN SODIUM (PORCINE) 5000 UNIT/ML IJ SOLN
INTRAMUSCULAR | Status: AC
  Filled 2014-12-25: qty 1

## 2014-12-25 MED ORDER — HYDROCODONE-ACETAMINOPHEN 5-325 MG PO TABS: 1.0000 | ORAL_TABLET | Freq: Four times a day (QID) | ORAL | Status: DC | PRN

## 2014-12-25 MED ORDER — CEFAZOLIN SODIUM 1-5 GM-% IV SOLN
1.0000 g | INTRAVENOUS | Status: AC
  Administered 2014-12-25: 1 g via INTRAVENOUS

## 2014-12-25 MED ORDER — PROPOFOL 10 MG/ML IV BOLUS
INTRAVENOUS | Status: DC | PRN
  Administered 2014-12-25: 120 mg via INTRAVENOUS

## 2014-12-25 MED ORDER — PAPAVERINE HCL 30 MG/ML IJ SOLN
INTRAMUSCULAR | Status: DC | PRN
  Administered 2014-12-25: 30 mg via INTRAVENOUS

## 2014-12-25 MED ORDER — PAPAVERINE HCL 30 MG/ML IJ SOLN
INTRAMUSCULAR | Status: AC
  Filled 2014-12-25: qty 2

## 2014-12-25 MED ORDER — BUPIVACAINE-EPINEPHRINE (PF) 0.5% -1:200000 IJ SOLN
INTRAMUSCULAR | Status: AC
  Filled 2014-12-25: qty 30

## 2014-12-25 MED ORDER — MIDAZOLAM HCL 2 MG/2ML IJ SOLN
INTRAMUSCULAR | Status: DC | PRN
  Administered 2014-12-25: 2 mg via INTRAVENOUS

## 2014-12-25 MED ORDER — HYDROCODONE-ACETAMINOPHEN 5-325 MG PO TABS
ORAL_TABLET | ORAL | Status: AC
  Filled 2014-12-25: qty 1

## 2014-12-25 MED ORDER — FENTANYL CITRATE (PF) 100 MCG/2ML IJ SOLN
INTRAMUSCULAR | Status: DC
Start: 2014-12-25 — End: 2014-12-25
  Filled 2014-12-25: qty 2

## 2014-12-25 MED ORDER — LIDOCAINE HCL (CARDIAC) 20 MG/ML IV SOLN
INTRAVENOUS | Status: DC | PRN
  Administered 2014-12-25: 100 mg via INTRAVENOUS

## 2014-12-25 MED ORDER — CEFAZOLIN SODIUM 1-5 GM-% IV SOLN
INTRAVENOUS | Status: AC
  Administered 2014-12-25: 1 g via INTRAVENOUS
  Filled 2014-12-25: qty 50

## 2014-12-25 MED ORDER — SODIUM CHLORIDE 0.9 % IV SOLN
INTRAVENOUS | Status: DC
  Administered 2014-12-25: 13:00:00 via INTRAVENOUS

## 2014-12-25 SURGICAL SUPPLY — 55 items
BAG DECANTER STRL (MISCELLANEOUS) ×3 IMPLANT
BLADE SURG SZ11 CARB STEEL (BLADE) ×3 IMPLANT
BOOT SUTURE AID YELLOW STND (SUTURE) ×3 IMPLANT
BRUSH SCRUB 4% CHG (MISCELLANEOUS) ×3 IMPLANT
CANISTER SUCT 1200ML W/VALVE (MISCELLANEOUS) ×3 IMPLANT
CHLORAPREP W/TINT 26ML (MISCELLANEOUS) ×3 IMPLANT
CLIP SPRNG 6 S-JAW DBL (CLIP) ×1 IMPLANT
CLIP SPRNG 6MM S-JAW DBL (CLIP) ×3
ELECT CAUTERY BLADE 6.4 (BLADE) ×3 IMPLANT
GEL ULTRASOUND 20GR AQUASONIC (MISCELLANEOUS) ×3 IMPLANT
GLOVE BIO SURGEON STRL SZ7 (GLOVE) ×3 IMPLANT
GLOVE SURG SYN 7.0 (GLOVE) ×3 IMPLANT
GLOVE SURG SYN 7.0 PF PI (GLOVE) IMPLANT
GOWN STRL REUS W/ TWL LRG LVL3 (GOWN DISPOSABLE) ×1 IMPLANT
GOWN STRL REUS W/ TWL XL LVL3 (GOWN DISPOSABLE) ×1 IMPLANT
GOWN STRL REUS W/TWL LRG LVL3 (GOWN DISPOSABLE) ×3
GOWN STRL REUS W/TWL XL LVL3 (GOWN DISPOSABLE) ×3
HEMOSTAT SURGICEL 2X3 (HEMOSTASIS) ×3 IMPLANT
IV NS 500ML (IV SOLUTION) ×3
IV NS 500ML BAXH (IV SOLUTION) ×1 IMPLANT
KIT RM TURNOVER STRD PROC AR (KITS) ×3 IMPLANT
LABEL OR SOLS (LABEL) ×3 IMPLANT
LIQUID BAND (GAUZE/BANDAGES/DRESSINGS) ×3 IMPLANT
LOOP RED MAXI  1X406MM (MISCELLANEOUS) ×2
LOOP VESSEL MAXI 1X406 RED (MISCELLANEOUS) ×1 IMPLANT
LOOP VESSEL MINI 0.8X406 BLUE (MISCELLANEOUS) ×1 IMPLANT
LOOPS BLUE MINI 0.8X406MM (MISCELLANEOUS) ×2
NDL FILTER BLUNT 18X1 1/2 (NEEDLE) ×1 IMPLANT
NDL HYPO 30X.5 LL (NEEDLE) ×1 IMPLANT
NEEDLE FILTER BLUNT 18X 1/2SAF (NEEDLE) ×2
NEEDLE FILTER BLUNT 18X1 1/2 (NEEDLE) ×1 IMPLANT
NEEDLE HYPO 30X.5 LL (NEEDLE) IMPLANT
NS IRRIG 500ML POUR BTL (IV SOLUTION) ×3 IMPLANT
PACK EXTREMITY ARMC (MISCELLANEOUS) ×3 IMPLANT
PAD GROUND ADULT SPLIT (MISCELLANEOUS) ×3 IMPLANT
PAD PREP 24X41 OB/GYN DISP (PERSONAL CARE ITEMS) ×3 IMPLANT
SOLUTION CELL SAVER (CLIP) ×1 IMPLANT
STOCKINETTE 48X4 2 PLY STRL (GAUZE/BANDAGES/DRESSINGS) ×1 IMPLANT
STOCKINETTE STRL 4IN 9604848 (GAUZE/BANDAGES/DRESSINGS) ×3 IMPLANT
SUT GTX CV-6 30 (SUTURE) ×2 IMPLANT
SUT MNCRL AB 4-0 PS2 18 (SUTURE) ×3 IMPLANT
SUT PROLENE 6 0 BV (SUTURE) ×12 IMPLANT
SUT SILK 2 0 (SUTURE) ×3
SUT SILK 2 0 SH (SUTURE) ×3 IMPLANT
SUT SILK 2-0 18XBRD TIE 12 (SUTURE) ×1 IMPLANT
SUT SILK 3 0 (SUTURE) ×3
SUT SILK 3-0 18XBRD TIE 12 (SUTURE) ×1 IMPLANT
SUT SILK 4 0 (SUTURE) ×3
SUT SILK 4-0 18XBRD TIE 12 (SUTURE) ×1 IMPLANT
SUT VIC AB 3-0 SH 27 (SUTURE) ×6
SUT VIC AB 3-0 SH 27X BRD (SUTURE) ×2 IMPLANT
SYR 20CC LL (SYRINGE) ×3 IMPLANT
SYR 3ML LL SCALE MARK (SYRINGE) ×3 IMPLANT
SYR TB 1ML 27GX1/2 LL (SYRINGE) ×1 IMPLANT
TOWEL OR 17X26 4PK STRL BLUE (TOWEL DISPOSABLE) ×3 IMPLANT
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
## 2014-12-25 NOTE — OR Nursing (Signed)
Glucose of 221 reported to Dr. Ronelle Nigh, no new orders obtained.

## 2014-12-25 NOTE — H&P (Signed)
[facility] History & Physical Update  The patient was interviewed and re-examined.  The patient's previous History and Physical has been reviewed and is unchanged.  There is no change in the plan of care.  DEW,JASON, MD  12/25/2014, 2:49 PM

## 2014-12-25 NOTE — Op Note (Signed)
[facility]   OPERATIVE NOTE   PROCEDURE: Left brachiocephalic arteriovenous fistula placement  PRE-OPERATIVE DIAGNOSIS: 1.  ESRD        POST-OPERATIVE DIAGNOSIS: 1. ESRD       SURGEON: Leotis Pain, MD  ASSISTANT(S): Hezzie Bump, PA-C  ANESTHESIA: general  ESTIMATED BLOOD LOSS: minimal  FINDING(S): Adequate cephalic vein for fistula creation  SPECIMEN(S):  none  INDICATIONS:   [name] is a 59 y.o. female who presents with renal failure in need of pemanent dialysis acces.  The patient is scheduled for left  arm AVF placement.  The patient is aware the risks include but are not limited to: bleeding, infection, steal syndrome, nerve damage, ischemic monomelic neuropathy, failure to mature, and need for additional procedures.  The patient is aware of the risks of the procedure and elects to proceed forward.  DESCRIPTION: After full informed written consent was obtained from the patient, the patient was brought back to the operating room and placed supine upon the operating table.  Prior to induction, the patient received IV antibiotics.   After obtaining adequate anesthesia, the patient was then prepped and draped in the standard fashion for a left arm access procedure.  I made a curvilinear incision at the level of the antecubital fossa and dissected through the subcutaneous tissue and fascia to gain exposure of the brachial artery.  This was noted to be patent and adequate in size for fistula creation.  This was dissected out proximally and distally and prepared for control with vessel loops .  I then dissected out the cephalic vein.  This was noted to be patent and adequate in size for fistula creation.  I then gave the patient 3000 units of intravenous heparin.  The vein was marked for orientation and the distal segment of the vein was ligated with a  2-0 silk, and the vein was transected.  I then instilled the heparinized saline into the vein and clamped  it.  At this point, I reset my exposure of the brachial artery and pulled up control on the vessel loops.  I made an arteriotomy with a #11 blade, and then I extended the arteriotomy with a Potts scissor.  I injected heparinized saline proximal and distal to this arteriotomy.  The vein was then sewn to the artery in an end-to-side configuration with a running stitch of 6-0 Prolene.  Prior to completing this anastomosis, I allowed the vein and artery to backbleed.  There was no evidence of clot from any vessels.  I completed the anastomosis in the usual fashion and then released all vessel loops and clamps.  There was a palpable  thrill in the venous outflow, and there was a palpable pulse in the artery distal to the anastomosis.  At this point, I irrigated out the surgical wound.  Surgicel was placed. There was no further active bleeding.  The subcutaneous tissue was reapproximated with a running stitch of 3-0 Vicryl.  The skin was then closed with a 4-0 Monocryl suture.  The skin was then cleaned, dried, and reinforced with Dermabond.  The patient tolerated this procedure well and was taken to the recovery room in stable condition  COMPLICATIONS: None  CONDITION: Stable   Alexandrina Fiorini    12/25/2014, 4:43 PM

## 2014-12-25 NOTE — OR Nursing (Signed)
Permacath intact in right subclavion, dressing Dry and Intact, ports capped and wrapped with gauze

## 2014-12-25 NOTE — Anesthesia Postprocedure Evaluation (Signed)
  Anesthesia Post-op Note  Patient: [name]  Procedure(s) Performed: Procedure(s): ARTERIOVENOUS (AV) FISTULA CREATION (Left)  Anesthesia type:General  Patient location: PACU  Post pain: Pain level controlled  Post assessment: Post-op Vital signs reviewed, Patient's Cardiovascular Status Stable, Respiratory Function Stable, Patent Airway and No signs of Nausea or vomiting  Post vital signs: Reviewed and stable  Last Vitals:  Filed Vitals:   12/25/14 1741  BP: 169/81  Pulse: 94  Temp: 36.5 C  Resp:     Level of consciousness: awake, alert  and patient cooperative  Complications: No apparent anesthesia complications

## 2014-12-25 NOTE — Discharge Instructions (Signed)
No heavy lifting for 5-7 days. Replace dressing as needed Call or contact our office with any fever >101, wound redness or drainage, severe pain, or other issuesAMBULATORY SURGERY  DISCHARGE INSTRUCTIONS   1) The drugs that you were given will stay in your system until tomorrow so for the next 24 hours you should not:  A) Drive an automobile B) Make any legal decisions C) Drink any alcoholic beverage   2) You may resume regular meals tomorrow.  Today it is better to start with liquids and gradually work up to solid foods.  You may eat anything you prefer, but it is better to start with liquids, then soup and crackers, and gradually work up to solid foods.   3) Please notify your doctor immediately if you have any unusual bleeding, trouble breathing, redness and pain at the surgery site, drainage, fever, or pain not relieved by medication. 4)   5) Your post-operative visit with Dr. Lucky Cowboy    Please call the office for an appt in 6 weeks

## 2014-12-25 NOTE — Transfer of Care (Signed)
Immediate Anesthesia Transfer of Care Note  Patient: [name]  Procedure(s) Performed: Procedure(s): ARTERIOVENOUS (AV) FISTULA CREATION (Left)  Patient Location: PACU  Anesthesia Type:General  Level of Consciousness: awake, alert  and oriented  Airway & Oxygen Therapy: Patient Spontanous Breathing and Patient connected to nasal cannula oxygen  Post-op Assessment: Report given to RN and Post -op Vital signs reviewed and stable  Post vital signs: Reviewed and stable  Last Vitals:  Filed Vitals:   12/25/14 1708  BP: 149/65  Pulse: 95  Temp: 36.2 C  Resp: 18    Complications: No apparent anesthesia complications

## 2014-12-26 ENCOUNTER — Encounter: Payer: Self-pay | Admitting: Vascular Surgery

## 2014-12-30 ENCOUNTER — Encounter: Payer: Medicare Other | Admitting: Surgery

## 2014-12-30 DIAGNOSIS — L89112 Pressure ulcer of right upper back, stage 2: Secondary | ICD-10-CM | POA: Diagnosis not present

## 2014-12-31 NOTE — Progress Notes (Signed)
[name], [name] ([id number]) Visit Report for 12/30/2014 Chief Complaint Document Details [name], [name]. Date of Service: 12/30/2014 9:30 AM Medical Record Number: [id number] Patient Account Number: [id number] Date of Birth/Sex: [birth year] (59 y.o. Female) Treating RN: Primary Care Physician: Steele Sizer Other Clinician: Referring Physician: Steele Sizer Treating Physician/Extender: Frann Rider in Treatment: 1 Information Obtained from: Patient Chief Complaint Patient presents to the wound care center for a consult due non healing wound. She has an open wound on her right upper back which she's had for about a year and she recently noticed a blister on her right lower extremity about 2 weeks ago. Electronic Signature(s) Signed: 12/30/2014 12:20:02 PM By: [name], [name] Entered By: [name] on 12/30/2014 09:59:09 [name], [name] ([id number]) -------------------------------------------------------------------------------- Debridement Details [name]. Date of Service: 12/30/2014 9:30 AM Medical Record Number: [id number] Patient Account Number: [id number] Date of Birth/Sex: 1955-09-28 (59 y.o. Female) Treating RN: Primary Care Physician: Steele Sizer Other Clinician: Referring Physician: Steele Sizer Treating Physician/Extender: Frann Rider in Treatment: 1 Debridement Performed for Wound #1 Right Back Assessment: Performed By: Physician Pat Patrick., MD Debridement: Open Wound/Selective Debridement Selective Description: Pre-procedure Yes Verification/Time Out Taken: Start Time: 09:52 Pain Control: Lidocaine 4% Topical Solution Level: Skin/Epidermis Total Area Debrided (L x 5 (cm) x 5.6 (cm) = 28 (cm) W): Tissue and other Non-Viable, Eschar, Exudate, Skin material debrided: Instrument: Forceps, Scissors Bleeding: Minimum End Time: 09:53 Procedural Pain: 0 Post Procedural Pain: 0 Post  Debridement Measurements of Total Wound Length: (cm) 4 Stage: Category/Stage II Width: (cm) 7 Depth: (cm) 0.1 Volume: (cm) 2.199 Electronic Signature(s) Signed: 12/30/2014 12:20:02 PM By: [name], [name] Entered By: [name] on 12/30/2014 09:59:01 [name], [name] ([id number]) -------------------------------------------------------------------------------- HPI Details [name]. Date of Service: 12/30/2014 9:30 AM Medical Record Number: [id number] Patient Account Number: [id number] Date of Birth/Sex: 01-03-1956 (59 y.o. Female) Treating RN: Primary Care Physician: Steele Sizer Other Clinician: Referring Physician: Steele Sizer Treating Physician/Extender: Frann Rider in Treatment: 1 History of Present Illness Location: right upper back and right lower extremity wounds Quality: Patient reports No Pain. Severity: Patient states wound (s) are getting better. Duration: Patient has had the wound for > 3 months prior to seeking treatment at the wound center Timing: she thought it first occurred when she was using a heating pad about a year ago after back surgery. Context: The wound appeared gradually over time Modifying Factors: Patient is currently on renal dialysis and receives treatments 3 times weekly Associated Signs and Symptoms: Patient reports having: surgery scheduled for this week for a AV fistula left arm. HPI Description: 59 year old patient who is known to be a diabetic and has end-stage renal disease has had several comorbidities including coronary artery disease, hypertension, hyperlipidemia, pancreatitis, anemia, previous history of hysterectomy, cholecystectomy, left-sided salivary gland excision, bilateral cataract surgery,Peritoneal dialysis catheter, hemodialysis catheter. the area on the back has also been caused by instant pressure she used to sleep on a recliner all day and has significant kyphoscoliosis. As far as the  wound on her right lower extremity she's not sure how this blister occurred but she thought it has been there for about 2 weeks. No recent blood investigations available and no recent hemoglobin A1c. 12/30/2014 -- she is an assisted living facility but I believe the nurses that have not followed instructions as she had some cream applied on her back and there was a different dressing. Last week she's had  a AV fistula placed on her left forearm. Electronic Signature(s) Signed: 12/30/2014 12:20:02 PM By: [name], [name] Entered By: [name] on 12/30/2014 10:00:02 [name], [name] ([id number]) -------------------------------------------------------------------------------- Physical Exam Details [name], [name]. Date of Service: 12/30/2014 9:30 AM Medical Record Number: [id number] Patient Account Number: [id number] Date of Birth/Sex: [birth year] (59 y.o. Female) Treating RN: Primary Care Physician: Steele Sizer Other Clinician: Referring Physician: Steele Sizer Treating Physician/Extender: Frann Rider in Treatment: 1 Constitutional . Pulse regular. Respirations normal and unlabored. Afebrile. . Eyes Nonicteric. Reactive to light. Ears, Nose, Mouth, and Throat Lips, teeth, and gums WNL.Marland Kitchen Moist mucosa without lesions . Neck supple and nontender. No palpable supraclavicular or cervical adenopathy. Normal sized without goiter. Respiratory WNL. No retractions.. Cardiovascular Pedal Pulses WNL. No clubbing, cyanosis or edema. Integumentary (Hair, Skin) the area on her back has now got very loose epidermis stuck almost like a blister and I'm sharply debrided this.. No crepitus or fluctuance. No peri-wound warmth or erythema. No masses.Marland Kitchen Psychiatric Judgement and insight Intact.. No evidence of depression, anxiety, or agitation.. Electronic Signature(s) Signed: 12/30/2014 12:20:02 PM By: [name], [name] Entered By: [name] on 12/30/2014  10:00:56 [name], [name] ([id number]) -------------------------------------------------------------------------------- Physician Orders Details [name]. Date of Service: 12/30/2014 9:30 AM Medical Record Number: [id number] Patient Account Number: [id number] Date of Birth/Sex: Jun 21, 1956 (59 y.o. Female) Treating RN: Junious Dresser Primary Care Physician: Steele Sizer Other Clinician: Referring Physician: Steele Sizer Treating Physician/Extender: Frann Rider in Treatment: 1 Verbal / Phone Orders: Yes Clinician: Junious Dresser Read Back and Verified: Yes Diagnosis Coding Wound Cleansing Wound #1 Right Back o Clean wound with Normal Saline. Wound #2 Right,Anterior Lower Leg o Clean wound with Normal Saline. Anesthetic Wound #1 Right Back o Topical Lidocaine 4% cream applied to wound bed prior to debridement Wound #2 Right,Anterior Lower Leg o Topical Lidocaine 4% cream applied to wound bed prior to debridement Primary Wound Dressing Wound #1 Right Back o Prisma Ag - or silver collagen equivalent. Please do not apply any medicated cream. Wound #2 Right,Anterior Lower Leg o Other: - betadine paint and Mepitel (or contact layer equivalent) Secondary Dressing Wound #1 Right Back o Contact Layer - Mepitel (or contact layer equivalent) o ABD pad - secure with only paper tape Wound #2 Right,Anterior Lower Leg o Boardered Foam Dressing Dressing Change Frequency Wound #1 Right Back o Change dressing every other day. Wound #2 Right,Anterior Lower Leg o Change dressing every week - to be changed next week in the wound clinic [name], [name] VILES. ([id number]) Follow-up Appointments Wound #1 Right Back o Return Appointment in 1 week. Wound #2 Right,Anterior Lower Leg o Return Appointment in 1 week. Off-Loading Wound #1 Right Back o Turn and reposition every 2 hours Electronic Signature(s) Signed: 12/30/2014 12:20:02 PM By:  [name], [name] Signed: 12/30/2014 5:14:43 PM By: Junious Dresser RN Entered By: Junious Dresser on 12/30/2014 10:03:11 [name], [name] ([id number]) -------------------------------------------------------------------------------- Problem List Details Patient Name: Ghanem, Naevia S. Date of Service: 12/30/2014 9:30 AM Medical Record Number: [id number] Patient Account Number: [id number] Date of Birth/Sex: [birth year] (59 y.o. Female) Treating RN: Primary Care Physician: Steele Sizer Other Clinician: Referring Physician: Steele Sizer Treating Physician/Extender: Frann Rider in Treatment: 1 Active Problems ICD-10 Encounter Code Description Active Date Diagnosis E11.622 Type 2 diabetes mellitus with other skin ulcer 12/23/2014 Yes L89.112 Pressure ulcer of right upper back, stage 2 12/23/2014 Yes S80.821A Blister (nonthermal), right lower leg, initial encounter 12/23/2014 Yes N18.6  End stage renal disease 12/23/2014 Yes Inactive Problems Resolved Problems Electronic Signature(s) Signed: 12/30/2014 12:20:02 PM By: [name], [name] Entered By: [name] on 12/30/2014 09:58:28 Daughdrill, [name] ([id number]) -------------------------------------------------------------------------------- Progress Note Details [name], [name] Date of Service: 12/30/2014 9:30 AM Medical Record Number: [id number] Patient Account Number: [id number] Date of Birth/Sex: 1955-12-15 (59 y.o. Female) Treating RN: Primary Care Physician: Steele Sizer Other Clinician: Referring Physician: Steele Sizer Treating Physician/Extender: Frann Rider in Treatment: 1 Subjective Chief Complaint Information obtained from Patient Patient presents to the wound care center for a consult due non healing wound. She has an open wound on her right upper back which she's had for about a year and she recently noticed a blister on her right lower extremity about 2 weeks ago. History of Present  Illness (HPI) The following HPI elements were documented for the patient's wound: Location: right upper back and right lower extremity wounds Quality: Patient reports No Pain. Severity: Patient states wound (s) are getting better. Duration: Patient has had the wound for > 3 months prior to seeking treatment at the wound center Timing: she thought it first occurred when she was using a heating pad about a year ago after back surgery. Context: The wound appeared gradually over time Modifying Factors: Patient is currently on renal dialysis and receives treatments 3 times weekly Associated Signs and Symptoms: Patient reports having: surgery scheduled for this week for a AV fistula left arm. 59 year old patient who is known to be a diabetic and has end-stage renal disease has had several comorbidities including coronary artery disease, hypertension, hyperlipidemia, pancreatitis, anemia, previous history of hysterectomy, cholecystectomy, left-sided salivary gland excision, bilateral cataract surgery,Peritoneal dialysis catheter, hemodialysis catheter. the area on the back has also been caused by instant pressure she used to sleep on a recliner all day and has significant kyphoscoliosis. As far as the wound on her right lower extremity she's not sure how this blister occurred but she thought it has been there for about 2 weeks. No recent blood investigations available and no recent hemoglobin A1c. 12/30/2014 -- she is an assisted living facility but I believe the nurses that have not followed instructions as she had some cream applied on her back and there was a different dressing. Last week she's had a AV fistula placed on her left forearm. Objective [name], [name] ([id number]) Constitutional Pulse regular. Respirations normal and unlabored. Afebrile. Vitals Time Taken: 9:39 AM, Height: 65 in, Weight: 156 lbs, BMI: 26, Temperature: 97.9 F, Pulse: 89 bpm, Respiratory Rate: 16 breaths/min,  Blood Pressure: 92/47 mmHg. Eyes Nonicteric. Reactive to light. Ears, Nose, Mouth, and Throat Lips, teeth, and gums WNL.Marland Kitchen Moist mucosa without lesions . Neck supple and nontender. No palpable supraclavicular or cervical adenopathy. Normal sized without goiter. Respiratory WNL. No retractions.. Cardiovascular Pedal Pulses WNL. No clubbing, cyanosis or edema. Psychiatric Judgement and insight Intact.. No evidence of depression, anxiety, or agitation.. Integumentary (Hair, Skin) the area on her back has now got very loose epidermis stuck almost like a blister and I'm sharply debrided this.. No crepitus or fluctuance. No peri-wound warmth or erythema. No masses.. Wound #1 status is Open. Original cause of wound was Blister. The wound is located on the Right Back. The wound measures 5cm length x 4cm width x 0.1cm depth; 15.708cm^2 area and 1.571cm^3 volume. The wound is limited to skin breakdown. There is no tunneling or undermining noted. There is a small amount of serosanguineous drainage noted. The wound margin is distinct with  the outline attached to the wound base. There is medium (34-66%) pink granulation within the wound bed. There is no necrotic tissue within the wound bed. The periwound skin appearance exhibited: Moist. The periwound skin appearance did not exhibit: Callus, Crepitus, Excoriation, Fluctuance, Friable, Induration, Localized Edema, Rash, Scarring, Dry/Scaly, Maceration, Atrophie Blanche, Cyanosis, Ecchymosis, Hemosiderin Staining, Mottled, Pallor, Rubor, Erythema. Periwound temperature was noted as No Abnormality. Wound #2 status is Open. Original cause of wound was Blister. The wound is located on the Right,Anterior Lower Leg. The wound measures 0.2cm length x 0.2cm width x 0.1cm depth; 0.031cm^2 area and 0.003cm^3 volume. The wound is limited to skin breakdown. There is no tunneling or undermining noted. There is a none present amount of drainage noted. The wound  margin is distinct with the outline attached to the wound base. There is no granulation within the wound bed. There is no necrotic tissue within the wound bed. The periwound skin appearance exhibited: Dry/Scaly. The periwound skin appearance did not exhibit: Callus, Crepitus, Excoriation, Fluctuance, Friable, Induration, Localized Edema, Rash, Scarring, Maceration, Moist, Atrophie Blanche, Cyanosis, Ecchymosis, Hemosiderin Staining, Mottled, Pallor, Rubor, [name], [name] ([id number]) Erythema. Periwound temperature was noted as No Abnormality. Her back now has moisture related injury and I have washed this area with saline and found the area on her back has now got very loose epidermis stuck almost like a blister and I'm sharply debrided this. This has made the size of the wound larger. Assessment Active Problems ICD-10 E11.622 - Type 2 diabetes mellitus with other skin ulcer L89.112 - Pressure ulcer of right upper back, stage 2 S80.821A - Blister (nonthermal), right lower leg, initial encounter N18.6 - End stage renal disease Diagnoses ICD-10 E11.622: Type 2 diabetes mellitus with other skin ulcer L89.112: Pressure ulcer of right upper back, stage 2 S80.821A: Blister (nonthermal), right lower leg, initial encounter N18.6: End stage renal disease Procedures Wound #1 Wound #1 is a Pressure Ulcer located on the Right Back . There was a Skin/Epidermis Open Wound/Selective [phone] debridement with total area of 28 sq cm performed by Suzzanne Brunkhorst, Jackson Latino., MD. with the following instrument(s): Forceps and Scissors to remove Non-Viable tissue/material including Exudate, Eschar, and Skin after achieving pain control using Lidocaine 4% Topical Solution. A time out was conducted prior to the start of the procedure. A Minimum amount of bleeding was controlled with N/A. The patient tolerated the procedure with a pain level of 0 throughout and a pain level of 0 following the procedure. Post  Debridement Measurements: 4cm length x 7cm width x 0.1cm depth; 2.199cm^3 volume. Post debridement Stage noted as Category/Stage II. Plan [name], [name] ([id number]) Wound Cleansing: Wound #1 Right Back: Clean wound with Normal Saline. Wound #2 Right,Anterior Lower Leg: Clean wound with Normal Saline. Anesthetic: Wound #1 Right Back: Topical Lidocaine 4% cream applied to wound bed prior to debridement Wound #2 Right,Anterior Lower Leg: Topical Lidocaine 4% cream applied to wound bed prior to debridement Primary Wound Dressing: Wound #1 Right Back: Prisma Ag - or silver collagen equivalent. Please do not apply any medicated cream. Wound #2 Right,Anterior Lower Leg: Other: - betadine paint and Mepitel (or contact layer equivalent) Secondary Dressing: Wound #1 Right Back: Contact Layer - Mepitel (or contact layer equivalent) ABD pad - secure with only paper tape Wound #2 Right,Anterior Lower Leg: Boardered Foam Dressing Dressing Change Frequency: Wound #1 Right Back: Change dressing every other day. Wound #2 Right,Anterior Lower Leg: Change dressing every week - to be changed next week in the  wound clinic Follow-up Appointments: Wound #1 Right Back: Return Appointment in 1 week. Wound #2 Right,Anterior Lower Leg: Return Appointment in 1 week. Off-Loading: Wound #1 Right Back: Turn and reposition every 2 hours We will continue with the Mepitel and nonstick adherent dressing to the right lower extremity and he went on for a week. I have advised her Prisma AG on her back with appropriate application of a large offloading ABD pad and have asked her to stay off this as much as possible. Will rewrite the instructions for the nurses at the skilled facility and asked them to call if they have any questions. She will come back and see me next week. CIPRIANA, BILLER ([id number]) Electronic Signature(s) Signed: 12/30/2014 5:08:51 PM By: Junious Dresser RN Signed: 12/31/2014 4:45:26 PM By:  [name], [name] Previous Signature: 12/30/2014 12:20:02 PM Version By: [name], [name] Entered By: Junious Dresser on 12/30/2014 17:08:51 [place], [name] ([id number]) -------------------------------------------------------------------------------- SuperBill Details Patient Name: HAYLA, HINGER. Date of Service: 12/30/2014 Medical Record Number: [id number] Patient Account Number: [id number] Date of Birth/Sex: 10-08-1955 (59 y.o. Female) Treating RN: Primary Care Physician: Steele Sizer Other Clinician: Referring Physician: Steele Sizer Treating Physician/Extender: Frann Rider in Treatment: 1 Diagnosis Coding ICD-10 Codes Code Description [phone] Type 2 diabetes mellitus with other skin ulcer L89.112 Pressure ulcer of right upper back, stage 2 S80.821A Blister (nonthermal), right lower leg, initial encounter N18.6 End stage renal disease Facility Procedures CPT4 Code: [id number] Description: [phone] - DEBRIDE WOUND 1ST 20 SQ CM OR < ICD-10 Description Diagnosis E11.622 Type 2 diabetes mellitus with other skin ulcer S80.821A Blister (nonthermal), right lower leg, initial encou L89.112 Pressure ulcer of right upper back, stage 2 Modifier: nter Quantity: 1 CPT4 Code: [id number] Description: 65681 - DEBRIDE WOUND EA ADDL 20 SQ CM ICD-10 Description Diagnosis E11.622 Type 2 diabetes mellitus with other skin ulcer L89.112 Pressure ulcer of right upper back, stage 2 S80.821A Blister (nonthermal), right lower leg, initial encou Modifier: nter Quantity: 1 Physician Procedures CPT4 Code: [id number] Description: 17494 - WC PHYS DEBR WO ANESTH 20 SQ CM ICD-10 Description Diagnosis E11.622 Type 2 diabetes mellitus with other skin ulcer S80.821A Blister (nonthermal), right lower leg, initial encoun L89.112 Pressure ulcer of right upper back, stage 2 Modifier: ter Quantity: 1 CPT4 Code: [id number] Ransier, Delmi Description: 97598 - WC PHYS DEBR WO ANESTH EA ADD 20 CM Description S.  ([id number]) Modifier: Quantity: 1 Electronic Signature(s) Signed: 12/30/2014 12:20:02 PM By: [name], [name] Entered By: [name] on 12/30/2014 10:03:52

## 2014-12-31 NOTE — Progress Notes (Signed)
[name], [name] ([id number]) Visit Report for 12/30/2014 Arrival Information Details Patient Name: [name], [name]. Date of Service: 12/30/2014 9:30 AM Medical Record Number: [id number] Patient Account Number: [id number] Date of Birth/Sex: [birth year] (59 y.o. Female) Treating RN: Baruch Gouty, RN, [name], [name] Primary Care Physician: Steele Sizer Other Clinician: Referring Physician: Steele Sizer Treating Physician/Extender: Frann Rider in Treatment: 1 Visit Information History Since Last Visit Any new allergies or adverse reactions: No Patient Arrived: [name] Had a fall or experienced change in No Arrival Time: 09:39 activities of daily living that may affect Accompanied By: self risk of falls: Transfer Assistance: None Signs or symptoms of abuse/neglect since last No Patient Identification Verified: Yes visito Secondary Verification Process Yes Hospitalized since last visit: No Completed: Pain Present Now: No Patient Requires Transmission- No Based Precautions: Patient Has Alerts: Yes Patient Alerts: Patient on Blood Thinner ABI R: 1.28 Electronic Signature(s) Signed: 12/30/2014 12:22:51 PM By: Regan Lemming [name], RN Entered By: Regan Lemming on 12/30/2014 10:09:39 Griswold, [name] ([id number]) -------------------------------------------------------------------------------- Encounter Discharge Information Details [name]. Date of Service: 12/30/2014 9:30 AM Medical Record Number: [id number] Patient Account Number: [id number] Date of Birth/Sex: Nov 22, 1955 (59 y.o. Female) Treating RN: Baruch Gouty, RN, [name], [name] Primary Care Physician: Steele Sizer Other Clinician: Referring Physician: Steele Sizer Treating Physician/Extender: Frann Rider in Treatment: 1 Encounter Discharge Information Items Discharge Pain Level: 0 Discharge Condition: Stable Ambulatory Status: Walker Discharge Destination: Home Transportation: Other Schedule Follow-up  Appointment: No Medication Reconciliation completed and No provided to Patient/Care Shariyah Eland: Clinical Summary of Care: Electronic Signature(s) Signed: 12/30/2014 12:22:51 PM By: Regan Lemming [name], RN Previous Signature: 12/30/2014 10:06:17 AM Version By: Ruthine Dose Entered By: Regan Lemming on 12/30/2014 10:13:37 Ohanian, [name] ([id number]) -------------------------------------------------------------------------------- Lower Extremity Assessment Details Patient Name: [name], [name] Date of Service: 12/30/2014 9:30 AM Medical Record Number: [id number] Patient Account Number: [id number] Date of Birth/Sex: 09-29-1955 (59 y.o. Female) Treating RN: Baruch Gouty, RN, [name], [name] Primary Care Physician: Steele Sizer Other Clinician: Referring Physician: Steele Sizer Treating Physician/Extender: Frann Rider in Treatment: 1 Vascular Assessment Pulses: Posterior Tibial Dorsalis Pedis Palpable: [Right:Yes] Extremity colors, hair growth, and conditions: Extremity Color: [Right:Normal] Hair Growth on Extremity: [Right:Yes] Temperature of Extremity: [Right:Warm] Capillary Refill: [Right:< 3 seconds] Dependent Rubor: [Right:No] Blanched when Elevated: [Right:No] Lipodermatosclerosis: [Right:No] Blood Pressure: Brachial: [Right:92] Dorsalis Pedis: [Left:Dorsalis Pedis: 416] Ankle: Posterior Tibial: [Left:Posterior Tibial: 606] [Right:1.28] Toe Nail Assessment Left: Right: Thick: No Discolored: Yes Deformed: No Improper Length and Hygiene: No Electronic Signature(s) Signed: 12/30/2014 12:22:51 PM By: Regan Lemming [name], RN Entered By: Regan Lemming on 12/30/2014 10:09:11 Gent, [name] ([id number]) -------------------------------------------------------------------------------- Multi Wound Chart Details [name]. Date of Service: 12/30/2014 9:30 AM Medical Record Number: [id number] Patient Account Number: [id number] Date of Birth/Sex: 1956/04/02 (59 y.o.  Female) Treating RN: Junious Dresser Primary Care Physician: Steele Sizer Other Clinician: Referring Physician: Steele Sizer Treating Physician/Extender: Frann Rider in Treatment: 1 Vital Signs Height(in): 65 Pulse(bpm): 89 Weight(lbs): 156 Blood Pressure 92/47 (mmHg): Body Mass Index(BMI): 26 Temperature(F): 97.9 Respiratory Rate 16 (breaths/min): Photos: [1:No Photos] [2:No Photos] [N/A:N/A] Wound Location: [1:Right Back] [2:Right Lower Leg - Anterior N/A] Wounding Event: [1:Blister] [2:Blister] [N/A:N/A] Primary Etiology: [1:Pressure Ulcer] [2:To be determined] [N/A:N/A] Comorbid History: [1:Glaucoma, Anemia, Coronary Artery Disease, Coronary Artery Disease, Hypertension, Type II Diabetes, End Stage Renal Disease, Osteoarthritis, Neuropathy Osteoarthritis, Neuropathy] [2:Glaucoma, Anemia, Hypertension, Type II Diabetes,  End Stage Renal Disease,] [N/A:N/A] Date Acquired: [1:10/19/2013] [2:11/18/2014] [N/A:N/A] Weeks of Treatment: [1:1] [2:1] [N/A:N/A] Wound  Status: [1:Open] [2:Open] [N/A:N/A] Measurements L x W x D 5x4x0.1 [2:0.2x0.2x0.1] [N/A:N/A] (cm) Area (cm) : [1:15.708] [2:0.031] [N/A:N/A] Volume (cm) : [1:1.571] [2:0.003] [N/A:N/A] % Reduction in Area: [1:59.20%] [2:99.70%] [N/A:N/A] % Reduction in Volume: 59.20% [2:99.80%] [N/A:N/A] Classification: [1:Category/Stage II] [2:Unclassifiable] [N/A:N/A] HBO Classification: [1:N/A] [2:Grade 0] [N/A:N/A] Exudate Amount: [1:Small] [2:None Present] [N/A:N/A] Exudate Type: [1:Serosanguineous] [2:N/A] [N/A:N/A] Exudate Color: [1:red, brown] [2:N/A] [N/A:N/A] Wound Margin: [1:Distinct, outline attached Distinct, outline attached N/A] Granulation Amount: [1:Medium (34-66%)] [2:None Present (0%)] [N/A:N/A] Granulation Quality: [1:Pink] [2:N/A] [N/A:N/A] Necrotic Amount: [1:None Present (0%)] [2:None Present (0%)] [N/A:N/A] Exposed Structures: [1:Fascia: No Fat: No] [2:Fascia: No Fat: No] [N/A:N/A] Tendon:  No Tendon: No Muscle: No Muscle: No Joint: No Joint: No Bone: No Bone: No Limited to Skin Limited to Skin Breakdown Breakdown Epithelialization: Medium (34-66%) Large (67-100%) N/A Periwound Skin Texture: Edema: No Edema: No N/A Excoriation: No Excoriation: No Induration: No Induration: No Callus: No Callus: No Crepitus: No Crepitus: No Fluctuance: No Fluctuance: No Friable: No Friable: No Rash: No Rash: No Scarring: No Scarring: No Periwound Skin Moist: Yes Dry/Scaly: Yes N/A Moisture: Maceration: No Maceration: No Dry/Scaly: No Moist: No Periwound Skin Color: Atrophie Blanche: No Atrophie Blanche: No N/A Cyanosis: No Cyanosis: No Ecchymosis: No Ecchymosis: No Erythema: No Erythema: No Hemosiderin Staining: No Hemosiderin Staining: No Mottled: No Mottled: No Pallor: No Pallor: No Rubor: No Rubor: No Temperature: No Abnormality No Abnormality N/A Tenderness on No No N/A Palpation: Wound Preparation: Ulcer Cleansing: Ulcer Cleansing: Not N/A Rinsed/Irrigated with Cleansed: intact blister Saline Topical Anesthetic Applied: None Treatment Notes Electronic Signature(s) Signed: 12/30/2014 5:14:43 PM By: Junious Dresser RN Entered By: Junious Dresser on 12/30/2014 09:51:29 Rubens, [name] ([id number]) -------------------------------------------------------------------------------- Multi-Disciplinary Care Plan Details [name]. Date of Service: 12/30/2014 9:30 AM Medical Record Number: [id number] Patient Account Number: [id number] Date of Birth/Sex: Feb 14, 1956 (59 y.o. Female) Treating RN: Junious Dresser Primary Care Physician: Steele Sizer Other Clinician: Referring Physician: Steele Sizer Treating Physician/Extender: Frann Rider in Treatment: 1 Active Inactive Abuse / Safety / Falls / Self Care Management Nursing Diagnoses: Potential for falls Goals: Patient will remain injury free Date Initiated: 12/23/2014 Goal  Status: Active Patient/caregiver will verbalize understanding of skin care regimen Date Initiated: 12/23/2014 Goal Status: Active Patient/caregiver will verbalize/demonstrate measures taken to prevent injury and/or falls Date Initiated: 12/23/2014 Goal Status: Active Patient/caregiver will verbalize/demonstrate understanding of what to do in case of emergency Date Initiated: 12/23/2014 Goal Status: Active Interventions: Assess fall risk on admission and as needed Assess: immobility, friction, shearing, incontinence upon admission and as needed Assess impairment of mobility on admission and as needed per policy Assess self care needs on admission and as needed Provide education on fall prevention Notes: Orientation to the Wound Care Program Nursing Diagnoses: Knowledge deficit related to the wound healing center program Goals: Patient/caregiver will verbalize understanding of the [place] Program Date Initiated: 12/23/2014 [name] ([id number]) Goal Status: Active Interventions: Provide education on orientation to the wound center Notes: Wound/Skin Impairment Nursing Diagnoses: Impaired tissue integrity Knowledge deficit related to ulceration/compromised skin integrity Goals: Patient/caregiver will verbalize understanding of skin care regimen Date Initiated: 12/23/2014 Goal Status: Active Ulcer/skin breakdown will heal within 14 weeks Date Initiated: 12/23/2014 Goal Status: Active Interventions: Assess patient/caregiver ability to obtain necessary supplies Assess patient/caregiver ability to perform ulcer/skin care regimen upon admission and as needed Assess ulceration(s) every visit Provide education on ulcer and skin care Treatment Activities: Skin care regimen initiated : 12/30/2014 Topical wound management initiated :  12/30/2014 Notes: Electronic Signature(s) Signed: 12/30/2014 5:14:43 PM By: Junious Dresser RN Entered By: Junious Dresser on 12/30/2014  09:51:17 Treu, [name] ([id number]) -------------------------------------------------------------------------------- Pain Assessment Details [name]. Date of Service: 12/30/2014 9:30 AM Medical Record Number: [id number] Patient Account Number: [id number] Date of Birth/Sex: 1955/12/20 (59 y.o. Female) Treating RN: Baruch Gouty, RN, [name], [name] Primary Care Physician: Steele Sizer Other Clinician: Referring Physician: Steele Sizer Treating Physician/Extender: Frann Rider in Treatment: 1 Active Problems Location of Pain Severity and Description of Pain Patient Has Paino No Site Locations Pain Management and Medication Current Pain Management: Electronic Signature(s) Signed: 12/30/2014 12:22:51 PM By: Regan Lemming [name], RN Entered By: Regan Lemming on 12/30/2014 09:39:55 Delavega, [name] ([id number]) -------------------------------------------------------------------------------- Patient/Caregiver Education Details [name]. Date of Service: 12/30/2014 9:30 AM Medical Record Number: [id number] Patient Account Number: [id number] Date of Birth/Gender: 1955/08/17 (59 y.o. Female) Treating RN: Junious Dresser Primary Care Physician: Steele Sizer Other Clinician: Referring Physician: Steele Sizer Treating Physician/Extender: Frann Rider in Treatment: 1 Education Assessment Education Provided To: Patient Education Topics Provided Offloading: Methods: Explain/Verbal Responses: State content correctly Safety: Methods: Explain/Verbal Responses: State content correctly Wound Debridement: Methods: Explain/Verbal Responses: State content correctly Wound/Skin Impairment: Methods: Explain/Verbal Responses: State content correctly Electronic Signature(s) Signed: 12/30/2014 5:14:43 PM By: Junious Dresser RN Entered By: Junious Dresser on 12/30/2014 10:07:55 Crago, [name]  ([id number]) -------------------------------------------------------------------------------- Wound Assessment Details Patient Name: ZJIRC, Estera S. Date of Service: 12/30/2014 9:30 AM Medical Record Number: [id number] Patient Account Number: [id number] Date of Birth/Sex: [birth year] (59 y.o. Female) Treating RN: [name], RN, [name], [place] Primary Care Physician: Steele Sizer Other Clinician: Referring Physician: Steele Sizer Treating Physician/Extender: Frann Rider in Treatment: 1 Wound Status Wound Number: 1 Primary Pressure Ulcer Etiology: Wound Location: Right Back Wound Open Wounding Event: Blister Status: Date Acquired: 10/19/2013 Comorbid Glaucoma, Anemia, Coronary Artery Weeks Of Treatment: 1 History: Disease, Hypertension, Type II Clustered Wound: No Diabetes, End Stage Renal Disease, Osteoarthritis, Neuropathy Photos Photo Uploaded By: Regan Lemming on 12/30/2014 15:45:52 Wound Measurements Length: (cm) 5 Width: (cm) 4 Depth: (cm) 0.1 Area: (cm) 15.708 Volume: (cm) 1.571 % Reduction in Area: 59.2% % Reduction in Volume: 59.2% Epithelialization: Medium (34-66%) Tunneling: No Undermining: No Wound Description Classification: Category/Stage II Wound Margin: Distinct, outline attached Exudate Amount: Small Exudate Type: Serosanguineous Exudate Color: red, brown Foul Odor After Cleansing: No Wound Bed Granulation Amount: Medium (34-66%) Exposed Structure Granulation Quality: Pink Fascia Exposed: No Necrotic Amount: None Present (0%) Fat Layer Exposed: No Langdon, Najat S. ([id number]) Tendon Exposed: No Muscle Exposed: No Joint Exposed: No Bone Exposed: No Limited to Skin Breakdown Periwound Skin Texture Texture Color No Abnormalities Noted: No No Abnormalities Noted: No Callus: No Atrophie Blanche: No Crepitus: No Cyanosis: No Excoriation: No Ecchymosis: No Fluctuance: No Erythema: No Friable: No Hemosiderin Staining: No Induration:  No Mottled: No Localized Edema: No Pallor: No Rash: No Rubor: No Scarring: No Temperature / Pain Moisture Temperature: No Abnormality No Abnormalities Noted: No Dry / Scaly: No Maceration: No Moist: Yes Wound Preparation Ulcer Cleansing: Rinsed/Irrigated with Saline Topical Anesthetic Applied: None Treatment Notes Wound #1 (Right Back) 1. Cleansed with: Clean wound with Normal Saline 3. Peri-wound Care: Skin Prep 4. Dressing Applied: Prisma Ag 5. Secondary Dressing Applied Bordered Foam Dressing Electronic Signature(s) Signed: 12/30/2014 12:22:51 PM By: Regan Lemming [name], RN Entered By: Regan Lemming on 12/30/2014 09:44:58 Baquero, [name] ([id number]) -------------------------------------------------------------------------------- Wound Assessment Details [name]. Date of Service: 12/30/2014 9:30 AM Medical  Record Number: [id number] Patient Account Number: [id number] Date of Birth/Sex: Jul 02, 1956 (59 y.o. Female) Treating RN: [name], RN, [name], [place] Primary Care Physician: Steele Sizer Other Clinician: Referring Physician: Steele Sizer Treating Physician/Extender: Frann Rider in Treatment: 1 Wound Status Wound Number: 2 Primary To be determined Etiology: Wound Location: Right Lower Leg - Anterior Wound Open Wounding Event: Blister Status: Date Acquired: 11/18/2014 Comorbid Glaucoma, Anemia, Coronary Artery Weeks Of Treatment: 1 History: Disease, Hypertension, Type II Clustered Wound: No Diabetes, End Stage Renal Disease, Osteoarthritis, Neuropathy Photos Photo Uploaded By: Regan Lemming on 12/30/2014 15:45:53 Wound Measurements Length: (cm) 0.2 Width: (cm) 0.2 Depth: (cm) 0.1 Area: (cm) 0.031 Volume: (cm) 0.003 % Reduction in Area: 99.7% % Reduction in Volume: 99.8% Epithelialization: Large (67-100%) Tunneling: No Undermining: No Wound Description Classification: Unclassifiable Foul O Diabetic Severity (Wagner): Grade  0 Wound Margin: Distinct, outline attached Exudate Amount: None Present dor After Cleansing: No Wound Bed Granulation Amount: None Present (0%) Exposed Structure Necrotic Amount: None Present (0%) Fascia Exposed: No Fat Layer Exposed: No Tendon Exposed: No Mcclaine, Janaiah S. ([id number]) Muscle Exposed: No Joint Exposed: No Bone Exposed: No Limited to Skin Breakdown Periwound Skin Texture Texture Color No Abnormalities Noted: No No Abnormalities Noted: No Callus: No Atrophie Blanche: No Crepitus: No Cyanosis: No Excoriation: No Ecchymosis: No Fluctuance: No Erythema: No Friable: No Hemosiderin Staining: No Induration: No Mottled: No Localized Edema: No Pallor: No Rash: No Rubor: No Scarring: No Temperature / Pain Moisture Temperature: No Abnormality No Abnormalities Noted: No Dry / Scaly: Yes Maceration: No Moist: No Wound Preparation Ulcer Cleansing: Not Cleansed: intact blister, Treatment Notes Wound #2 (Right, Anterior Lower Leg) 1. Cleansed with: Clean wound with Normal Saline 3. Peri-wound Care: Skin Prep 4. Dressing Applied: Mepitel 5. Secondary Dressing Applied Bordered Foam Dressing Electronic Signature(s) Signed: 12/30/2014 12:22:51 PM By: Regan Lemming [name], RN Entered By: Regan Lemming on 12/30/2014 09:45:51 [place]olt, [name] ([id number]) -------------------------------------------------------------------------------- [place] Details [name]. Date of Service: 12/30/2014 9:30 AM Medical Record Number: [id number] Patient Account Number: [id number] Date of Birth/Sex: Nov 06, 1955 (59 y.o. Female) Treating RN: [name], RN, [name], [place] Primary Care Physician: Steele Sizer Other Clinician: Referring Physician: Steele Sizer Treating Physician/Extender: Frann Rider in Treatment: 1 Vital Signs Time Taken: 09:39 Temperature (F): 97.9 Height (in): 65 Pulse (bpm): 89 Weight (lbs): 156 Respiratory Rate (breaths/min): 16 Body  Mass Index (BMI): 26 Blood Pressure (mmHg): 92/47 Reference Range: 80 - 120 mg / dl Electronic Signature(s) Signed: 12/30/2014 12:22:51 PM By: Regan Lemming [name], RN Entered By: Regan Lemming on 12/30/2014 09:40:17

## 2015-01-06 ENCOUNTER — Encounter: Payer: Medicare Other | Admitting: Surgery

## 2015-01-06 DIAGNOSIS — L89112 Pressure ulcer of right upper back, stage 2: Secondary | ICD-10-CM | POA: Diagnosis not present

## 2015-01-07 NOTE — Progress Notes (Signed)
ZAKYRA, KUKUK ([id number]) Visit Report for 01/06/2015 Arrival Information Details [name], [name]. Date of Service: 01/06/2015 9:30 AM Medical Record Number: [id number] Patient Account Number: [id number] Date of Birth/Sex: 1956/03/08 (59 y.o. Female) Treating RN: Baruch Gouty, RN, [name], [name] Primary Care Physician: Steele Sizer Other Clinician: Referring Physician: Steele Sizer Treating Physician/Extender: Frann Rider in Treatment: 2 Visit Information History Since Last Visit Any new allergies or adverse reactions: No Patient Arrived: [name] Had a fall or experienced change in No Arrival Time: 09:27 activities of daily living that may affect Accompanied By: self risk of falls: Transfer Assistance: None Hospitalized since last visit: No Patient Identification Verified: Yes Has Dressing in Place as Prescribed: Yes Secondary Verification Process Yes Pain Present Now: No Completed: Patient Requires Transmission- No Based Precautions: Patient Has Alerts: Yes Patient Alerts: Patient on Blood Thinner ABI R: 1.28 Electronic Signature(s) Signed: 01/06/2015 4:04:52 PM By: Regan Lemming [name], RN Entered By: Regan Lemming on 01/06/2015 09:27:44 [name], [name] ([id number]) -------------------------------------------------------------------------------- Clinic Level of Care Assessment Details [name]. Date of Service: 01/06/2015 9:30 AM Medical Record Number: [id number] Patient Account Number: [id number] Date of Birth/Sex: Dec 22, 1955 (59 y.o. Female) Treating RN: Junious Dresser Primary Care Physician: Steele Sizer Other Clinician: Referring Physician: Steele Sizer Treating Physician/Extender: Frann Rider in Treatment: 2 Clinic Level of Care Assessment Items TOOL 4 Quantity Score []  - Use when only an EandM is performed on FOLLOW-UP visit 0 ASSESSMENTS - Nursing Assessment / Reassessment []  - Reassessment of Co-morbidities (includes  updates in patient status) 0 []  - Reassessment of Adherence to Treatment Plan 0 ASSESSMENTS - Wound and Skin Assessment / Reassessment X - Simple Wound Assessment / Reassessment - one wound 1 5 []  - Complex Wound Assessment / Reassessment - multiple wounds 0 []  - Dermatologic / Skin Assessment (not related to wound area) 0 ASSESSMENTS - Focused Assessment X - Circumferential Edema Measurements - multi extremities 1 5 []  - Nutritional Assessment / Counseling / Intervention 0 X - Lower Extremity Assessment (monofilament, tuning fork, pulses) 1 5 []  - Peripheral Arterial Disease Assessment (using hand held doppler) 0 ASSESSMENTS - Ostomy and/or Continence Assessment and Care []  - Incontinence Assessment and Management 0 []  - Ostomy Care Assessment and Management (repouching, etc.) 0 PROCESS - Coordination of Care X - Simple Patient / Family Education for ongoing care 1 15 []  - Complex (extensive) Patient / Family Education for ongoing care 0 []  - Staff obtains Programmer, systems, Records, Test Results / Process Orders 0 X - Staff telephones HHA, Nursing Homes / Clarify orders / etc 1 10 []  - Routine Transfer to another Facility (non-emergent condition) 0 [name], [name] ([id number]) []  - Routine Hospital Admission (non-emergent condition) 0 []  - New Admissions / Biomedical engineer / Ordering NPWT, Apligraf, etc. 0 []  - Emergency Hospital Admission (emergent condition) 0 X - Simple Discharge Coordination 1 10 []  - Complex (extensive) Discharge Coordination 0 PROCESS - Special Needs []  - Pediatric / Minor Patient Management 0 []  - Isolation Patient Management 0 []  - Hearing / Language / Visual special needs 0 []  - Assessment of Community assistance (transportation, D/C planning, etc.) 0 []  - Additional assistance / Altered mentation 0 []  - Support Surface(s) Assessment (bed, cushion, seat, etc.) 0 INTERVENTIONS - Wound Cleansing / Measurement X - Simple Wound Cleansing - one wound 1 5 []  -  Complex Wound Cleansing - multiple wounds 0 X - Wound Imaging (photographs - any number of wounds) 1 5 []  - Wound  Tracing (instead of photographs) 0 X - Simple Wound Measurement - one wound 1 5 []  - Complex Wound Measurement - multiple wounds 0 INTERVENTIONS - Wound Dressings X - Small Wound Dressing one or multiple wounds 1 10 []  - Medium Wound Dressing one or multiple wounds 0 []  - Large Wound Dressing one or multiple wounds 0 []  - Application of Medications - topical 0 []  - Application of Medications - injection 0 INTERVENTIONS - Miscellaneous []  - External ear exam 0 [name], [name] ([id number]) []  - Specimen Collection (cultures, biopsies, blood, body fluids, etc.) 0 []  - Specimen(s) / Culture(s) sent or taken to Lab for analysis 0 []  - Patient Transfer (multiple staff / Harrel Lemon Lift / Similar devices) 0 []  - Simple Staple / Suture removal (25 or less) 0 []  - Complex Staple / Suture removal (26 or more) 0 []  - Hypo / Hyperglycemic Management (close monitor of Blood Glucose) 0 []  - Ankle / Brachial Index (ABI) - do not check if billed separately 0 X - Vital Signs 1 5 Has the patient been seen at the hospital within the last three years: Yes Total Score: 80 Level Of Care: New/Established - Level 3 Electronic Signature(s) Signed: 01/06/2015 5:19:44 PM By: Junious Dresser RN Entered By: Junious Dresser on 01/06/2015 09:46:21 [name], [name] ([id number]) -------------------------------------------------------------------------------- Encounter Discharge Information Details Patient Name: [name], [name] Date of Service: 01/06/2015 9:30 AM Medical Record Number: [id number] Patient Account Number: [id number] Date of Birth/Sex: [birth year] (59 y.o. Female) Treating RN: Primary Care Physician: Steele Sizer Other Clinician: Referring Physician: Steele Sizer Treating Physician/Extender: Frann Rider in Treatment: 2 Encounter Discharge Information Items Schedule Follow-up  Appointment: No Medication Reconciliation completed No and provided to Patient/Care [name]: Provided on Clinical Summary of Care: 01/06/2015 Form Type Recipient Paper Patient LD Electronic Signature(s) Signed: 01/06/2015 9:53:10 AM By: Ruthine Dose Entered By: Ruthine Dose on 01/06/2015 09:53:09 Bascom, [name] ([id number]) -------------------------------------------------------------------------------- Lower Extremity Assessment Details [name], [name] Date of Service: 01/06/2015 9:30 AM Medical Record Number: [id number] Patient Account Number: [id number] Date of Birth/Sex: May 25, 1956 (59 y.o. Female) Treating RN: Baruch Gouty, RN, [name], [name] Primary Care Physician: Steele Sizer Other Clinician: Referring Physician: Steele Sizer Treating Physician/Extender: Frann Rider in Treatment: 2 Edema Assessment Assessed: [Left: No] [Right: No] Edema: [Left: N] [Right: o] Vascular Assessment Claudication: Claudication Assessment [Right:None] Pulses: Posterior Tibial Dorsalis Pedis Palpable: [Right:Yes] Extremity colors, hair growth, and conditions: Extremity Color: [Right:Normal] Hair Growth on Extremity: [Right:Yes] Temperature of Extremity: [Right:Warm] Capillary Refill: [Right:< 3 seconds] Toe Nail Assessment Left: Right: Thick: No Discolored: Yes Deformed: No Improper Length and Hygiene: No Electronic Signature(s) Signed: 01/06/2015 4:04:52 PM By: Regan Lemming [name], RN Entered By: Regan Lemming on 01/06/2015 09:33:14 [place], [name] ([id number]) -------------------------------------------------------------------------------- Multi Wound Chart Details [name]. Date of Service: 01/06/2015 9:30 AM Medical Record Number: [id number] Patient Account Number: [id number] Date of Birth/Sex: August 16, 1955 (59 y.o. Female) Treating RN: Junious Dresser Primary Care Physician: Steele Sizer Other Clinician: Referring Physician: Steele Sizer Treating Physician/Extender: Frann Rider in Treatment: 2 Vital Signs Height(in): 65 Pulse(bpm): 87 Weight(lbs): 156 Blood Pressure 161/78 (mmHg): Body Mass Index(BMI): 26 Temperature(F): 98.4 Respiratory Rate 16 (breaths/min): Photos: [1:No Photos] [2:No Photos] [N/A:N/A] Wound Location: [1:Right Back] [2:Right Lower Leg - Anterior N/A] Wounding Event: [1:Blister] [2:Blister] [N/A:N/A] Primary Etiology: [1:Pressure Ulcer] [2:Pressure Ulcer] [N/A:N/A] Comorbid History: [1:Glaucoma, Anemia, Coronary Artery Disease, Coronary Artery Disease, Hypertension, Type II Diabetes, End Stage Renal Disease, Osteoarthritis, Neuropathy Osteoarthritis, Neuropathy] [2:Glaucoma, Anemia, Hypertension,  Type II Diabetes,  End Stage Renal Disease,] [N/A:N/A] Date Acquired: [1:10/19/2013] [2:11/18/2014] [N/A:N/A] Weeks of Treatment: [1:2] [2:2] [N/A:N/A] Wound Status: [1:Open] [2:Open] [N/A:N/A] Measurements L x W x D 5x6.5x0.1 [2:0x0x0] [N/A:N/A] (cm) Area (cm) : [1:25.525] [2:0] [N/A:N/A] Volume (cm) : [1:2.553] [2:0] [N/A:N/A] % Reduction in Area: [1:33.70%] [2:100.00%] [N/A:N/A] % Reduction in Volume: 33.70% [2:100.00%] [N/A:N/A] Classification: [1:Category/Stage II] [2:Unstageable/Unclassified N/A] HBO Classification: [1:N/A] [2:Grade 0] [N/A:N/A] Exudate Amount: [1:Medium] [2:None Present] [N/A:N/A] Exudate Type: [1:Serosanguineous] [2:N/A] [N/A:N/A] Exudate Color: [1:red, brown] [2:N/A] [N/A:N/A] Wound Margin: [1:Distinct, outline attached Distinct, outline attached N/A] Granulation Amount: [1:Large (67-100%)] [2:None Present (0%)] [N/A:N/A] Granulation Quality: [1:Pink] [2:N/A] [N/A:N/A] Necrotic Amount: [1:None Present (0%)] [2:None Present (0%)] [N/A:N/A] Exposed Structures: [1:Fascia: No Fat: No] [2:Fascia: No Fat: No] [N/A:N/A] Tendon: No Tendon: No Muscle: No Muscle: No Joint: No Joint: No Bone: No Bone: No Limited to Skin Limited to Skin Breakdown  Breakdown Epithelialization: Medium (34-66%) Large (67-100%) N/A Periwound Skin Texture: Edema: No Edema: No N/A Excoriation: No Excoriation: No Induration: No Induration: No Callus: No Callus: No Crepitus: No Crepitus: No Fluctuance: No Fluctuance: No Friable: No Friable: No Rash: No Rash: No Scarring: No Scarring: No Periwound Skin Moist: Yes Dry/Scaly: Yes N/A Moisture: Maceration: No Maceration: No Dry/Scaly: No Moist: No Periwound Skin Color: Atrophie Blanche: No Atrophie Blanche: No N/A Cyanosis: No Cyanosis: No Ecchymosis: No Ecchymosis: No Erythema: No Erythema: No Hemosiderin Staining: No Hemosiderin Staining: No Mottled: No Mottled: No Pallor: No Pallor: No Rubor: No Rubor: No Temperature: No Abnormality No Abnormality N/A Tenderness on No No N/A Palpation: Wound Preparation: Ulcer Cleansing: Ulcer Cleansing: Not N/A Rinsed/Irrigated with Cleansed Saline Topical Anesthetic Applied: None Treatment Notes Electronic Signature(s) Signed: 01/06/2015 5:19:44 PM By: Junious Dresser RN Entered By: Junious Dresser on 01/06/2015 09:42:30 [name], [name] ([id number]) -------------------------------------------------------------------------------- Multi-Disciplinary Care Plan Details [name]. Date of Service: 01/06/2015 9:30 AM Medical Record Number: [id number] Patient Account Number: [id number] Date of Birth/Sex: 14-May-1956 (59 y.o. Female) Treating RN: Junious Dresser Primary Care Physician: Steele Sizer Other Clinician: Referring Physician: Steele Sizer Treating Physician/Extender: Frann Rider in Treatment: 2 Active Inactive Abuse / Safety / Falls / Self Care Management Nursing Diagnoses: Potential for falls Goals: Patient will remain injury free Date Initiated: 12/23/2014 Goal Status: Active Patient/caregiver will verbalize understanding of skin care regimen Date Initiated: 12/23/2014 Goal Status:  Active Patient/caregiver will verbalize/demonstrate measures taken to prevent injury and/or falls Date Initiated: 12/23/2014 Goal Status: Active Patient/caregiver will verbalize/demonstrate understanding of what to do in case of emergency Date Initiated: 12/23/2014 Goal Status: Active Interventions: Assess fall risk on admission and as needed Assess: immobility, friction, shearing, incontinence upon admission and as needed Assess impairment of mobility on admission and as needed per policy Assess self care needs on admission and as needed Provide education on fall prevention Notes: Wound/Skin Impairment Nursing Diagnoses: Impaired tissue integrity Knowledge deficit related to ulceration/compromised skin integrity Goals: Patient/caregiver will verbalize understanding of skin care regimen CAMREN, HENTHORN ([id number]) Date Initiated: 12/23/2014 Goal Status: Active Ulcer/skin breakdown will heal within 14 weeks Date Initiated: 12/23/2014 Goal Status: Active Interventions: Assess patient/caregiver ability to obtain necessary supplies Assess patient/caregiver ability to perform ulcer/skin care regimen upon admission and as needed Assess ulceration(s) every visit Provide education on ulcer and skin care Treatment Activities: Skin care regimen initiated : 01/06/2015 Topical wound management initiated : 01/06/2015 Notes: Electronic Signature(s) Signed: 01/06/2015 5:19:44 PM By: Junious Dresser RN Entered By: Junious Dresser on 01/06/2015 09:42:13 [name], [name] ([id number]) --------------------------------------------------------------------------------  Pain Assessment Details [name], [name]. Date of Service: 01/06/2015 9:30 AM Medical Record Number: [id number] Patient Account Number: [id number] Date of Birth/Sex: 1955-12-14 (59 y.o. Female) Treating RN: Baruch Gouty, RN, [name], [name] Primary Care Physician: Steele Sizer Other Clinician: Referring Physician: Steele Sizer Treating  Physician/Extender: Frann Rider in Treatment: 2 Active Problems Location of Pain Severity and Description of Pain Patient Has Paino No Site Locations Pain Management and Medication Current Pain Management: Electronic Signature(s) Signed: 01/06/2015 4:04:52 PM By: Regan Lemming [name], RN Entered By: Regan Lemming on 01/06/2015 09:29:34 [name], [name] ([id number]) -------------------------------------------------------------------------------- Patient/Caregiver Education Details [name]. Date of Service: 01/06/2015 9:30 AM Medical Record Number: [id number] Patient Account Number: [id number] Date of Birth/Gender: 08/20/1955 (59 y.o. Female) Treating RN: Baruch Gouty, RN, [name], [name] Primary Care Physician: Steele Sizer Other Clinician: Referring Physician: Steele Sizer Treating Physician/Extender: Frann Rider in Treatment: 2 Education Assessment Education Provided To: Patient Education Topics Provided Basic Hygiene: Methods: Explain/Verbal Responses: State content correctly Wound/Skin Impairment: Methods: Explain/Verbal Responses: State content correctly Electronic Signature(s) Signed: 01/06/2015 4:04:52 PM By: Regan Lemming [name], RN Entered By: Regan Lemming on 01/06/2015 09:53:37 [name], [name] ([id number]) -------------------------------------------------------------------------------- Wound Assessment Details Patient Name: [name], Stephan S. Date of Service: 01/06/2015 9:30 AM Medical Record Number: [id number] Patient Account Number: [id number] Date of Birth/Sex: [birth year] (59 y.o. Female) Treating RN: Baruch Gouty, RN, [name], [name] Primary Care Physician: Steele Sizer Other Clinician: Referring Physician: Steele Sizer Treating Physician/Extender: Frann Rider in Treatment: 2 Wound Status Wound Number: 1 Primary Pressure Ulcer Etiology: Wound Location: Right Back Wound Open Wounding Event: Blister Status: Date Acquired: 10/19/2013 Comorbid  Glaucoma, Anemia, Coronary Artery Weeks Of Treatment: 2 History: Disease, Hypertension, Type II Clustered Wound: No Diabetes, End Stage Renal Disease, Osteoarthritis, Neuropathy Photos Photo Uploaded By: Regan Lemming on 01/06/2015 15:37:01 Wound Measurements Length: (cm) 5 Width: (cm) 6.5 Depth: (cm) 0.1 Area: (cm) 25.525 Volume: (cm) 2.553 % Reduction in Area: 33.7% % Reduction in Volume: 33.7% Epithelialization: Medium (34-66%) Wound Description Classification: Category/Stage II Wound Margin: Distinct, outline attached Exudate Amount: Medium Exudate Type: Serosanguineous Exudate Color: red, brown Foul Odor After Cleansing: No Wound Bed Granulation Amount: Large (67-100%) Exposed Structure Granulation Quality: Pink Fascia Exposed: No Necrotic Amount: None Present (0%) Fat Layer Exposed: No Spofford, Miata S. ([id number]) Tendon Exposed: No Muscle Exposed: No Joint Exposed: No Bone Exposed: No Limited to Skin Breakdown Periwound Skin Texture Texture Color No Abnormalities Noted: No No Abnormalities Noted: No Callus: No Atrophie Blanche: No Crepitus: No Cyanosis: No Excoriation: No Ecchymosis: No Fluctuance: No Erythema: No Friable: No Hemosiderin Staining: No Induration: No Mottled: No Localized Edema: No Pallor: No Rash: No Rubor: No Scarring: No Temperature / Pain Moisture Temperature: No Abnormality No Abnormalities Noted: No Dry / Scaly: No Maceration: No Moist: Yes Wound Preparation Ulcer Cleansing: Rinsed/Irrigated with Saline Topical Anesthetic Applied: None Treatment Notes Wound #1 (Right Back) 1. Cleansed with: Clean wound with Normal Saline 3. Peri-wound Care: Skin Prep 4. Dressing Applied: Aquacel Ag 5. Secondary Dressing Applied Bordered Foam Dressing Electronic Signature(s) Signed: 01/06/2015 4:04:52 PM By: Regan Lemming [name], RN Entered By: Regan Lemming on 01/06/2015 09:36:57 Blakely, [name]  ([id number]) -------------------------------------------------------------------------------- Wound Assessment Details [name]. Date of Service: 01/06/2015 9:30 AM Medical Record Number: [id number] Patient Account Number: [id number] Date of Birth/Sex: January 26, 1956 (59 y.o. Female) Treating RN: Baruch Gouty, RN, [name], [name] Primary Care Physician: Steele Sizer Other Clinician: Referring Physician: Steele Sizer Treating Physician/Extender: Frann Rider in Treatment: 2  Wound Status Wound Number: 2 Primary Pressure Ulcer Etiology: Wound Location: Right Lower Leg - Anterior Wound Healed - Epithelialized Wounding Event: Blister Status: Date Acquired: 11/18/2014 Comorbid Glaucoma, Anemia, Coronary Artery Weeks Of Treatment: 2 History: Disease, Hypertension, Type II Clustered Wound: No Diabetes, End Stage Renal Disease, Osteoarthritis, Neuropathy Photos Photo Uploaded By: Regan Lemming on 01/06/2015 15:37:01 Wound Measurements Length: (cm) 0 % Reductio Width: (cm) 0 % Reductio Depth: (cm) 0 Epithelial Area: (cm) 0 Tunneling Volume: (cm) 0 Undermini n in Area: 100% n in Volume: 100% ization: Large (67-100%) : No ng: No Wound Description Classification: Unstageable/Unclassified Foul Odor Diabetic Severity Earleen Newport): Grade 0 Exudate Amount: None Present After Cleansing: No Wound Bed Granulation Amount: None Present (0%) Exposed Structure Necrotic Amount: None Present (0%) Fascia Exposed: No Fat Layer Exposed: No Tendon Exposed: No Muscle Exposed: No Thaxton, Kayley S. ([id number]) Joint Exposed: No Bone Exposed: No Limited to Skin Breakdown Periwound Skin Texture Texture Color No Abnormalities Noted: No No Abnormalities Noted: No Callus: No Atrophie Blanche: No Crepitus: No Cyanosis: No Excoriation: No Ecchymosis: No Fluctuance: No Erythema: No Friable: No Hemosiderin Staining: No Induration: No Mottled: No Localized Edema: No Pallor:  No Rash: No Rubor: No Scarring: No Temperature / Pain Moisture Temperature: No Abnormality No Abnormalities Noted: No Dry / Scaly: Yes Maceration: No Moist: No Wound Preparation Ulcer Cleansing: Not Cleansed Electronic Signature(s) Signed: 01/06/2015 4:04:52 PM By: Regan Lemming [name], RN Signed: 01/06/2015 5:19:44 PM By: Junious Dresser RN Entered By: Junious Dresser on 01/06/2015 09:43:22 Keo, [name] ([id number]) -------------------------------------------------------------------------------- Vitals Details Patient Name: Abigail Miyamoto, Ethan S. Date of Service: 01/06/2015 9:30 AM Medical Record Number: [id number] Patient Account Number: [id number] Date of Birth/Sex: 1956/03/26 (59 y.o. Female) Treating RN: [name], RN, [name], [place] Primary Care Physician: Steele Sizer Other Clinician: Referring Physician: Steele Sizer Treating Physician/Extender: Frann Rider in Treatment: 2 Vital Signs Time Taken: 09:29 Temperature (F): 98.4 Height (in): 65 Pulse (bpm): 87 Weight (lbs): 156 Respiratory Rate (breaths/min): 16 Body Mass Index (BMI): 26 Blood Pressure (mmHg): 161/78 Reference Range: 80 - 120 mg / dl Electronic Signature(s) Signed: 01/06/2015 4:04:52 PM By: Regan Lemming [name], RN Entered By: Regan Lemming on 01/06/2015 09:32:54

## 2015-01-08 NOTE — Progress Notes (Signed)
SHARINE, CADLE ([id number]) Visit Report for 01/06/2015 Chief Complaint Document Details [name], [name]. Date of Service: 01/06/2015 9:30 AM Medical Record Number: [id number] Patient Account Number: [id number] Date of Birth/Sex: [birth year] (59 y.o. Female) Treating RN: Primary Care Physician: Steele Sizer Other Clinician: Referring Physician: Steele Sizer Treating Physician/Extender: Frann Rider in Treatment: 2 Information Obtained from: Patient Chief Complaint Patient presents to the wound care center for a consult due non healing wound. She has an open wound on her right upper back which she's had for about a year and she recently noticed a blister on her right lower extremity about 2 weeks ago. Electronic Signature(s) Signed: 01/06/2015 12:24:35 PM By: [name], [name] Entered By: [name] on 01/06/2015 09:53:13 Frosch, [name] ([id number]) -------------------------------------------------------------------------------- HPI Details [name], [name]. Date of Service: 01/06/2015 9:30 AM Medical Record Number: [id number] Patient Account Number: [id number] Date of Birth/Sex: [birth year] (59 y.o. Female) Treating RN: Primary Care Physician: Steele Sizer Other Clinician: Referring Physician: Steele Sizer Treating Physician/Extender: Frann Rider in Treatment: 2 History of Present Illness Location: right upper back and right lower extremity wounds Quality: Patient reports No Pain. Severity: Patient states wound (s) are getting better. Duration: Patient has had the wound for > 3 months prior to seeking treatment at the wound center Timing: she thought it first occurred when she was using a heating pad about a year ago after back surgery. Context: The wound appeared gradually over time Modifying Factors: Patient is currently on renal dialysis and receives treatments 3 times weekly Associated Signs and Symptoms: Patient reports  having: surgery scheduled for this week for a AV fistula left arm. HPI Description: 59 year old patient who is known to be a diabetic and has end-stage renal disease has had several comorbidities including coronary artery disease, hypertension, hyperlipidemia, pancreatitis, anemia, previous history of hysterectomy, cholecystectomy, left-sided salivary gland excision, bilateral cataract surgery,Peritoneal dialysis catheter, hemodialysis catheter. the area on the back has also been caused by instant pressure she used to sleep on a recliner all day and has significant kyphoscoliosis. As far as the wound on her right lower extremity she's not sure how this blister occurred but she thought it has been there for about 2 weeks. No recent blood investigations available and no recent hemoglobin A1c. 12/30/2014 -- she is an assisted living facility but I believe the nurses that have not followed instructions as she had some cream applied on her back and there was a different dressing. Last week she's had a AV fistula placed on her left forearm. Electronic Signature(s) Signed: 01/06/2015 12:24:35 PM By: [name], [name] Entered By: [name] on 01/06/2015 09:53:19 Horkey, [name] ([id number]) -------------------------------------------------------------------------------- Physical Exam Details [name], [name] Date of Service: 01/06/2015 9:30 AM Medical Record Number: [id number] Patient Account Number: [id number] Date of Birth/Sex: 12/25/1955 (59 y.o. Female) Treating RN: Primary Care Physician: Steele Sizer Other Clinician: Referring Physician: Steele Sizer Treating Physician/Extender: Frann Rider in Treatment: 2 Constitutional . Pulse regular. Respirations normal and unlabored. Afebrile. . Eyes Nonicteric. Reactive to light. Ears, Nose, Mouth, and Throat Lips, teeth, and gums WNL.Marland Kitchen Moist mucosa without lesions . Neck supple and nontender. No palpable  supraclavicular or cervical adenopathy. Normal sized without goiter. Respiratory WNL. No retractions.. Cardiovascular Pedal Pulses WNL. No clubbing, cyanosis or edema. Musculoskeletal Adexa without tenderness or enlargement.. Digits and nails w/o clubbing, cyanosis, infection, petechiae, ischemia, or inflammatory conditions.. Integumentary (Hair, Skin) her right lower extremity has healed. The back has  been moist and there's quite a bit of drainage. There is healthy granulation tissue.Marland Kitchen No crepitus or fluctuance. No peri-wound warmth or erythema. No masses.Marland Kitchen Psychiatric Judgement and insight Intact.. No evidence of depression, anxiety, or agitation.. Electronic Signature(s) Signed: 01/06/2015 12:24:35 PM By: [name], [name] Entered By: [name] on 01/06/2015 09:54:02 Mayer, [name] ([id number]) -------------------------------------------------------------------------------- Physician Orders Details Patient Name: [name] Cornwall. Date of Service: 01/06/2015 9:30 AM Medical Record Number: [id number] Patient Account Number: [id number] Date of Birth/Sex: 11/21/1955 (59 y.o. Female) Treating RN: Junious Dresser Primary Care Physician: Steele Sizer Other Clinician: Referring Physician: Steele Sizer Treating Physician/Extender: Frann Rider in Treatment: 2 Verbal / Phone Orders: Yes Clinician: Junious Dresser Read Back and Verified: Yes Diagnosis Coding Wound Cleansing Wound #1 Right Back o Clean wound with Normal Saline. Anesthetic Wound #1 Right Back o Topical Lidocaine 4% cream applied to wound bed prior to debridement Primary Wound Dressing Wound #1 Right Back o Aquacel Ag Secondary Dressing Wound #1 Right Back o Boardered Foam Dressing Dressing Change Frequency Wound #1 Right Back o Change dressing every other day. Follow-up Appointments Wound #1 Right Back o Return Appointment in 1 week. Off-Loading Wound #1 Right Back o Turn and  reposition every 2 hours Electronic Signature(s) Signed: 01/06/2015 12:24:35 PM By: [name], [name] Signed: 01/06/2015 5:19:44 PM By: Junious Dresser RN Entered By: Junious Dresser on 01/06/2015 09:45:34 Espe, [name] ([id number]) Picchi, [name] ([id number]) -------------------------------------------------------------------------------- Problem List Details [name], [name] Date of Service: 01/06/2015 9:30 AM Medical Record Number: [id number] Patient Account Number: [id number] Date of Birth/Sex: [birth year] (59 y.o. Female) Treating RN: Primary Care Physician: Steele Sizer Other Clinician: Referring Physician: Steele Sizer Treating Physician/Extender: Frann Rider in Treatment: 2 Active Problems ICD-10 Encounter Code Description Active Date Diagnosis E11.622 Type 2 diabetes mellitus with other skin ulcer 12/23/2014 Yes L89.112 Pressure ulcer of right upper back, stage 2 12/23/2014 Yes S80.821A Blister (nonthermal), right lower leg, initial encounter 12/23/2014 Yes N18.6 End stage renal disease 12/23/2014 Yes Inactive Problems Resolved Problems Electronic Signature(s) Signed: 01/06/2015 12:24:35 PM By: [name], [name] Entered By: [name] on 01/06/2015 09:53:05 Frasco, [name] ([id number]) -------------------------------------------------------------------------------- Progress Note Details [name], [name] Date of Service: 01/06/2015 9:30 AM Medical Record Number: [id number] Patient Account Number: [id number] Date of Birth/Sex: [birth year] (59 y.o. Female) Treating RN: Primary Care Physician: Steele Sizer Other Clinician: Referring Physician: Steele Sizer Treating Physician/Extender: Frann Rider in Treatment: 2 Subjective Chief Complaint Information obtained from Patient Patient presents to the wound care center for a consult due non healing wound. She has an open wound on her right upper back which she's had for  about a year and she recently noticed a blister on her right lower extremity about 2 weeks ago. History of Present Illness (HPI) The following HPI elements were documented for the patient's wound: Location: right upper back and right lower extremity wounds Quality: Patient reports No Pain. Severity: Patient states wound (s) are getting better. Duration: Patient has had the wound for > 3 months prior to seeking treatment at the wound center Timing: she thought it first occurred when she was using a heating pad about a year ago after back surgery. Context: The wound appeared gradually over time Modifying Factors: Patient is currently on renal dialysis and receives treatments 3 times weekly Associated Signs and Symptoms: Patient reports having: surgery scheduled for this week for a AV fistula left arm. 59 year old patient who is known to be a  diabetic and has end-stage renal disease has had several comorbidities including coronary artery disease, hypertension, hyperlipidemia, pancreatitis, anemia, previous history of hysterectomy, cholecystectomy, left-sided salivary gland excision, bilateral cataract surgery,Peritoneal dialysis catheter, hemodialysis catheter. the area on the back has also been caused by instant pressure she used to sleep on a recliner all day and has significant kyphoscoliosis. As far as the wound on her right lower extremity she's not sure how this blister occurred but she thought it has been there for about 2 weeks. No recent blood investigations available and no recent hemoglobin A1c. 12/30/2014 -- she is an assisted living facility but I believe the nurses that have not followed instructions as she had some cream applied on her back and there was a different dressing. Last week she's had a AV fistula placed on her left forearm. Objective [name], [name] ([id number]) Constitutional Pulse regular. Respirations normal and unlabored. Afebrile. Vitals Time Taken: 9:29 AM,  Height: 65 in, Weight: 156 lbs, BMI: 26, Temperature: 98.4 F, Pulse: 87 bpm, Respiratory Rate: 16 breaths/min, Blood Pressure: 161/78 mmHg. Eyes Nonicteric. Reactive to light. Ears, Nose, Mouth, and Throat Lips, teeth, and gums WNL.Marland Kitchen Moist mucosa without lesions . Neck supple and nontender. No palpable supraclavicular or cervical adenopathy. Normal sized without goiter. Respiratory WNL. No retractions.. Cardiovascular Pedal Pulses WNL. No clubbing, cyanosis or edema. Musculoskeletal Adexa without tenderness or enlargement.. Digits and nails w/o clubbing, cyanosis, infection, petechiae, ischemia, or inflammatory conditions.Marland Kitchen Psychiatric Judgement and insight Intact.. No evidence of depression, anxiety, or agitation.. Integumentary (Hair, Skin) her right lower extremity has healed. The back has been moist and there's quite a bit of drainage. There is healthy granulation tissue.Marland Kitchen No crepitus or fluctuance. No peri-wound warmth or erythema. No masses.. Wound #1 status is Open. Original cause of wound was Blister. The wound is located on the Right Back. The wound measures 5cm length x 6.5cm width x 0.1cm depth; 25.525cm^2 area and 2.553cm^3 volume. The wound is limited to skin breakdown. There is a medium amount of serosanguineous drainage noted. The wound margin is distinct with the outline attached to the wound base. There is large (67-100%) pink granulation within the wound bed. There is no necrotic tissue within the wound bed. The periwound skin appearance exhibited: Moist. The periwound skin appearance did not exhibit: Callus, Crepitus, Excoriation, Fluctuance, Friable, Induration, Localized Edema, Rash, Scarring, Dry/Scaly, Maceration, Atrophie Blanche, Cyanosis, Ecchymosis, Hemosiderin Staining, Mottled, Pallor, Rubor, Erythema. Periwound temperature was noted as No Abnormality. Wound #2 status is Healed - Epithelialized. Original cause of wound was Blister. The wound is located  on the Right,Anterior Lower Leg. The wound measures 0cm length x 0cm width x 0cm depth; 0cm^2 area and 0cm^3 volume. The wound is limited to skin breakdown. There is no tunneling or undermining noted. There is a none present amount of drainage noted. There is no granulation within the wound bed. There is no necrotic tissue within the wound bed. The periwound skin appearance exhibited: Dry/Scaly. The periwound [name], [name] ([id number]) skin appearance did not exhibit: Callus, Crepitus, Excoriation, Fluctuance, Friable, Induration, Localized Edema, Rash, Scarring, Maceration, Moist, Atrophie Blanche, Cyanosis, Ecchymosis, Hemosiderin Staining, Mottled, Pallor, Rubor, Erythema. Periwound temperature was noted as No Abnormality. Assessment Active Problems ICD-10 E11.622 - Type 2 diabetes mellitus with other skin ulcer L89.112 - Pressure ulcer of right upper back, stage 2 S80.821A - Blister (nonthermal), right lower leg, initial encounter N18.6 - End stage renal disease Diagnoses ICD-10 E11.622: Type 2 diabetes mellitus with other skin ulcer L89.112: Pressure ulcer  of right upper back, stage 2 S80.821A: Blister (nonthermal), right lower leg, initial encounter N18.6: End stage renal disease The right lower extrimety is healed and I have asked her to protect this and make sure she moisturize it. The back needs much more offloading and is quite moist, and so we will change over to silver alginate and put some protective ABD pads. She is encouraged to offload as much as possible but has a problem during her dialysis as she has to be in a recliner. She will come back and see [place] next week. Plan Wound Cleansing: Wound #1 Right Back: Clean wound with Normal Saline. Anesthetic: Wound #1 Right Back: Topical Lidocaine 4% cream applied to wound bed prior to debridement Primary Wound Dressing: Wound #1 Right Back: Aquacel Ag Secondary Dressing: [name], [name] ([id number]) Wound #1 Right  Back: Boardered Foam Dressing Dressing Change Frequency: Wound #1 Right Back: Change dressing every other day. Follow-up Appointments: Wound #1 Right Back: Return Appointment in 1 week. Off-Loading: Wound #1 Right Back: Turn and reposition every 2 hours The right lower extrimety is healed and I have asked her to protect this and make sure she moisturize it. The back needs much more offloading and is quite moist, and so we will change over to silver alginate and put some protective ABD pads. She is encouraged to offload as much as possible but has a problem during her dialysis as she has to be in a recliner. She will come back and see [place] next week. Electronic Signature(s) Signed: 01/06/2015 5:11:59 PM By: Junious Dresser RN Signed: 01/07/2015 5:31:33 PM By: [name], [name] Previous Signature: 01/06/2015 12:24:35 PM Version By: [name], [name] Entered By: Junious Dresser on 01/06/2015 17:11:59 [name], [name] ([id number]) -------------------------------------------------------------------------------- SuperBill Details Patient Name: TABYTHA, GRADILLAS. Date of Service: 01/06/2015 Medical Record Number: [id number] Patient Account Number: [id number] Date of Birth/Sex: 1955/12/26 (59 y.o. Female) Treating RN: Primary Care Physician: Steele Sizer Other Clinician: Referring Physician: Steele Sizer Treating Physician/Extender: Frann Rider in Treatment: 2 Diagnosis Coding ICD-10 Codes Code Description [phone] Type 2 diabetes mellitus with other skin ulcer L89.112 Pressure ulcer of right upper back, stage 2 S80.821A Blister (nonthermal), right lower leg, initial encounter N18.6 End stage renal disease Facility Procedures CPT4 Code: [id number] Description: 99213 - WOUND CARE VISIT-LEV 3 EST PT Modifier: Quantity: 1 Physician Procedures CPT4 Code: [id number] Description: 88502 - WC PHYS LEVEL 3 - EST PT ICD-10 Description Diagnosis L89.112 Pressure ulcer of right upper  back, stage 2 E11.622 Type 2 diabetes mellitus with other skin ulcer Modifier: Quantity: 1 Electronic Signature(s) Signed: 01/06/2015 12:24:35 PM By: [name], [name] Entered By: [name] on 01/06/2015 09:59:17

## 2015-01-13 ENCOUNTER — Encounter: Payer: Medicare Other | Admitting: Surgery

## 2015-01-13 DIAGNOSIS — L89112 Pressure ulcer of right upper back, stage 2: Secondary | ICD-10-CM | POA: Diagnosis not present

## 2015-01-14 NOTE — Progress Notes (Signed)
[name], [name] ([id number]) Visit Report for 01/13/2015 Chief Complaint Document Details [name], [name] 01/13/2015 11:00 Date of Service: AM Medical Record [id number] Number: Patient Account Number: [id number] 1956/06/14 (59 y.o. Treating RN: Date of Birth/Sex: Female) Other Clinician: Primary Care Physician: Steele Sizer Treating [name] Referring Physician: Steele Sizer Physician/Extender: Suella Grove in Treatment: 3 Information Obtained from: Patient Chief Complaint Patient presents to the wound care center for a consult due non healing wound. She has an open wound on her right upper back which she's had for about a year and she recently noticed a blister on her right lower extremity about 2 weeks ago. Electronic Signature(s) Signed: 01/13/2015 12:30:49 PM By: [name], [name] Entered By: [name] on 01/13/2015 11:23:11 [name], [name] ([id number]) -------------------------------------------------------------------------------- Debridement Details [name], [name] 01/13/2015 11:00 Date of Service: AM Medical Record [id number] Number: Patient Account Number: [id number] 1956-06-20 (59 y.o. Treating RN: Date of Birth/Sex: Female) Other Clinician: Primary Care Physician: Steele Sizer Treating [name] Referring Physician: Steele Sizer Physician/Extender: [name]: 3 Debridement Performed for Wound #1 Right Back Assessment: Performed By: Physician Pat Patrick., MD Debridement: Open Wound/Selective Debridement Selective Description: Pre-procedure Yes Verification/Time Out Taken: Start Time: 11:19 Pain Control: Other : lidocaine 4% Level: Non-Viable Tissue Total Area Debrided (L x 4 (cm) x 3 (cm) = 12 (cm) W): Tissue and other Non-Viable, Skin material debrided: Instrument: Forceps, Scissors Bleeding: Minimum Hemostasis Achieved: Pressure End Time: 11:22 Procedural Pain: 1 Post Procedural Pain:  1 Response to Treatment: Procedure was tolerated well Post Debridement Measurements of Total Wound Length: (cm) 3.7 Stage: Category/Stage II Width: (cm) 6.4 Depth: (cm) 0.1 Volume: (cm) 1.86 Electronic Signature(s) Signed: 01/13/2015 12:30:49 PM By: [name], [name] Entered By: [name] on 01/13/2015 11:24:54 [name], [name] ([id number]) -------------------------------------------------------------------------------- HPI Details [name]. 01/13/2015 11:00 Date of Service: AM Medical Record [id number] Number: Patient Account Number: [id number] August 24, 1955 (59 y.o. Treating RN: Date of Birth/Sex: Female) Other Clinician: Primary Care Physician: Steele Sizer Treating [name] Referring Physician: Steele Sizer Physician/Extender: [name]: 3 History of Present Illness Location: right upper back and right lower extremity wounds Quality: Patient reports No Pain. Severity: Patient states wound (s) are getting better. Duration: Patient has had the wound for > 3 months prior to seeking treatment at the wound center Timing: she thought it first occurred when she was using a heating pad about a year ago after back surgery. Context: The wound appeared gradually over time Modifying Factors: Patient is currently on renal dialysis and receives treatments 3 times weekly Associated Signs and Symptoms: Patient reports having: surgery scheduled for this week for a AV fistula left arm. HPI Description: 59 year old patient who is known to be a diabetic and has end-stage renal disease has had several comorbidities including coronary artery disease, hypertension, hyperlipidemia, pancreatitis, anemia, previous history of hysterectomy, cholecystectomy, left-sided salivary gland excision, bilateral cataract surgery,Peritoneal dialysis catheter, hemodialysis catheter. the area on the back has also been caused by instant pressure she used to sleep on a  recliner all day and has significant kyphoscoliosis. As far as the wound on her right lower extremity she's not sure how this blister occurred but she thought it has been there for about 2 weeks. No recent blood investigations available and no recent hemoglobin A1c. 12/30/2014 -- she is an assisted living facility but I believe the nurses that have not followed instructions as she had some cream applied on her back and  there was a different dressing. Last week she's had a AV fistula placed on her left forearm. 01/13/2015 -- she has had some localized infection at the port site and she's been on doxycycline for this. Electronic Signature(s) Signed: 01/13/2015 12:30:49 PM By: [name], [name] Entered By: [name] on 01/13/2015 11:23:33 [name], [name] ([id number]) -------------------------------------------------------------------------------- Physical Exam Details [name], [name] 01/13/2015 11:00 Date of Service: AM Medical Record [id number] Number: Patient Account Number: [id number] October 29, 1955 (59 y.o. Treating RN: Date of Birth/Sex: Female) Other Clinician: Primary Care Physician: Steele Sizer Treating [name] Referring Physician: Steele Sizer Physician/Extender: [name]: 3 Constitutional . Pulse regular. Respirations normal and unlabored. Afebrile. . Eyes Nonicteric. Reactive to light. Ears, Nose, Mouth, and Throat Lips, teeth, and gums WNL.Marland Kitchen Moist mucosa without lesions . Neck supple and nontender. No palpable supraclavicular or cervical adenopathy. Normal sized without goiter. Respiratory WNL. No retractions.. Cardiovascular Pedal Pulses WNL. No clubbing, cyanosis or edema. Chest Breasts symmetical and no nipple discharge.. Breast tissue WNL, no masses, lumps, or tenderness.. Musculoskeletal Adexa without tenderness or enlargement.. Digits and nails w/o clubbing, cyanosis, infection, petechiae, ischemia, or inflammatory  conditions.. Integumentary (Hair, Skin) there is a large area of a skin flap which is absolutely loose and hanging and this will need to be sharply debrided.. No crepitus or fluctuance. No peri-wound warmth or erythema. No masses.Marland Kitchen Psychiatric Judgement and insight Intact.. No evidence of depression, anxiety, or agitation.. Electronic Signature(s) Signed: 01/13/2015 12:30:49 PM By: [name], [name] Entered By: [name] on 01/13/2015 11:24:18 [name], [name] ([id number]) -------------------------------------------------------------------------------- Physician Orders Details [name], [name] 01/13/2015 11:00 Date of Service: AM Medical Record [id number] Number: Patient Account Number: [id number] [birth year] (59 y.o. Treating RN: Cornell Barman Date of Birth/Sex: Female) Other Clinician: Primary Care Physician: Steele Sizer Treating [name] Referring Physician: Steele Sizer Physician/Extender: Suella Grove in Treatment: 3 Verbal / Phone Orders: Yes Clinician: Cornell Barman Read Back and Verified: Yes Diagnosis Coding Wound Cleansing Wound #1 Right Back o Clean wound with Normal Saline. Anesthetic Wound #1 Right Back o Topical Lidocaine 4% cream applied to wound bed prior to debridement Primary Wound Dressing Wound #1 Right Back o Aquacel Ag Secondary Dressing Wound #1 Right Back o Boardered Foam Dressing Dressing Change Frequency Wound #1 Right Back o Change dressing every other day. Follow-up Appointments Wound #1 Right Back o Return Appointment in 1 week. Off-Loading Wound #1 Right Back o Turn and reposition every 2 hours Electronic Signature(s) Signed: 01/13/2015 12:30:49 PM By: [name], [name] Signed: 01/13/2015 4:58:02 PM By: Gretta Cool RN, [name], Kim RN, [name] [name], [name] ([id number]) Entered By: Gretta Cool RN, [name], Kim on 01/13/2015 11:21:37 [name], [name]  ([id number]) -------------------------------------------------------------------------------- Problem List Details [name], [name] 01/13/2015 11:00 Date of Service: AM Medical Record [id number] Number: Patient Account Number: [id number] 1956-02-08 (59 y.o. Treating RN: Date of Birth/Sex: Female) Other Clinician: Primary Care Physician: Steele Sizer Treating [name] Referring Physician: Steele Sizer Physician/Extender: Suella Grove in Treatment: 3 Active Problems ICD-10 Encounter Code Description Active Date Diagnosis E11.622 Type 2 diabetes mellitus with other skin ulcer 12/23/2014 Yes L89.112 Pressure ulcer of right upper back, stage 2 12/23/2014 Yes S80.821A Blister (nonthermal), right lower leg, initial encounter 12/23/2014 Yes N18.6 End stage renal disease 12/23/2014 Yes Inactive Problems Resolved Problems Electronic Signature(s) Signed: 01/13/2015 12:30:49 PM By: [name], [name] Entered By: [name] on 01/13/2015 11:22:43 [name], [name] ([id number]) -------------------------------------------------------------------------------- Progress Note Details [name]. 01/13/2015  11:00 Date of Service: AM Medical Record [id number] Number: Patient Account Number: [id number] [birth year] (59 y.o. Treating RN: Date of Birth/Sex: Female) Other Clinician: Primary Care Physician: Steele Sizer Treating [name] Referring Physician: Steele Sizer Physician/Extender: Suella Grove in Treatment: 3 Subjective Chief Complaint Information obtained from Patient Patient presents to the wound care center for a consult due non healing wound. She has an open wound on her right upper back which she's had for about a year and she recently noticed a blister on her right lower extremity about 2 weeks ago. History of Present Illness (HPI) The following HPI elements were documented for the patient's wound: Location: right upper back and right lower  extremity wounds Quality: Patient reports No Pain. Severity: Patient states wound (s) are getting better. Duration: Patient has had the wound for > 3 months prior to seeking treatment at the wound center Timing: she thought it first occurred when she was using a heating pad about a year ago after back surgery. Context: The wound appeared gradually over time Modifying Factors: Patient is currently on renal dialysis and receives treatments 3 times weekly Associated Signs and Symptoms: Patient reports having: surgery scheduled for this week for a AV fistula left arm. 59 year old patient who is known to be a diabetic and has end-stage renal disease has had several comorbidities including coronary artery disease, hypertension, hyperlipidemia, pancreatitis, anemia, previous history of hysterectomy, cholecystectomy, left-sided salivary gland excision, bilateral cataract surgery,Peritoneal dialysis catheter, hemodialysis catheter. the area on the back has also been caused by instant pressure she used to sleep on a recliner all day and has significant kyphoscoliosis. As far as the wound on her right lower extremity she's not sure how this blister occurred but she thought it has been there for about 2 weeks. No recent blood investigations available and no recent hemoglobin A1c. 12/30/2014 -- she is an assisted living facility but I believe the nurses that have not followed instructions as she had some cream applied on her back and there was a different dressing. Last week she's had a AV fistula placed on her left forearm. 01/13/2015 -- she has had some localized infection at the port site and she's been on doxycycline for this. [name] ([id number]) Objective Constitutional Pulse regular. Respirations normal and unlabored. Afebrile. Vitals Time Taken: 11:06 AM, Height: 65 in, Weight: 156 lbs, BMI: 26, Temperature: 98.2 F, Pulse: 81 bpm, Respiratory Rate: 16 breaths/min, Blood Pressure:  147/75 mmHg. Eyes Nonicteric. Reactive to light. Ears, Nose, Mouth, and Throat Lips, teeth, and gums WNL.Marland Kitchen Moist mucosa without lesions . Neck supple and nontender. No palpable supraclavicular or cervical adenopathy. Normal sized without goiter. Respiratory WNL. No retractions.. Cardiovascular Pedal Pulses WNL. No clubbing, cyanosis or edema. Chest Breasts symmetical and no nipple discharge.. Breast tissue WNL, no masses, lumps, or tenderness.. Musculoskeletal Adexa without tenderness or enlargement.. Digits and nails w/o clubbing, cyanosis, infection, petechiae, ischemia, or inflammatory conditions.Marland Kitchen Psychiatric Judgement and insight Intact.. No evidence of depression, anxiety, or agitation.. Integumentary (Hair, Skin) there is a large area of a skin flap which is absolutely loose and hanging and this will need to be sharply debrided.. No crepitus or fluctuance. No peri-wound warmth or erythema. No masses.. Wound #1 status is Open. Original cause of wound was Blister. The wound is located on the Right Back. The wound measures 3.7cm length x 6.4cm width x 0.1cm depth; 18.598cm^2 area and 1.86cm^3 volume. The wound is limited to skin breakdown. There is no tunneling or undermining noted. There is a  medium amount of serosanguineous drainage noted. The wound margin is distinct with the outline attached to the wound base. There is large (67-100%) pink granulation within the wound bed. There is no necrotic tissue within the wound bed. The periwound skin appearance exhibited: Moist. The periwound skin appearance did not exhibit: Callus, Crepitus, Excoriation, Fluctuance, Friable, Induration, Localized Edema, Rash, Milne, Krystelle S. ([id number]) Scarring, Dry/Scaly, Maceration, Atrophie Blanche, Cyanosis, Ecchymosis, Hemosiderin Staining, Mottled, Pallor, Rubor, Erythema. Periwound temperature was noted as No Abnormality. Assessment Active Problems ICD-10 E11.622 - Type 2 diabetes mellitus  with other skin ulcer L89.112 - Pressure ulcer of right upper back, stage 2 S80.821A - Blister (nonthermal), right lower leg, initial encounter N18.6 - End stage renal disease We will continue with silver alginate and a ABD pad with a foam border over this so as to protect this area. We have discussed offloading as much as possible and I understand she finds it difficult during the time she has hemodialysis. we'll continue her doxycycline and see me back next week. Procedures Wound #1 Wound #1 is a Pressure Ulcer located on the Right Back . There was a Non-Viable Tissue Open Wound/Selective [id number]) debridement with total area of 12 sq cm performed by Mateo Overbeck, Jackson Latino., MD. with the following instrument(s): Forceps and Scissors to remove Non-Viable tissue/material including Skin after achieving pain control using Other (lidocaine 4%). A time out was conducted prior to the start of the procedure. A Minimum amount of bleeding was controlled with Pressure. The procedure was tolerated well with a pain level of 1 throughout and a pain level of 1 following the procedure. Post Debridement Measurements: 3.7cm length x 6.4cm width x 0.1cm depth; 1.86cm^3 volume. Post debridement Stage noted as Category/Stage II. Plan Wound Cleansing: Wound #1 Right Back: Clean wound with Normal Saline. [name] ([id number]) Anesthetic: Wound #1 Right Back: Topical Lidocaine 4% cream applied to wound bed prior to debridement Primary Wound Dressing: Wound #1 Right Back: Aquacel Ag Secondary Dressing: Wound #1 Right Back: Boardered Foam Dressing Dressing Change Frequency: Wound #1 Right Back: Change dressing every other day. Follow-up Appointments: Wound #1 Right Back: Return Appointment in 1 week. Off-Loading: Wound #1 Right Back: Turn and reposition every 2 hours We will continue with silver alginate and a ABD pad with a foam border over this so as to protect this area. We have discussed  offloading as much as possible and I understand she finds it difficult during the time she has hemodialysis. we'll continue her doxycycline and see me back next week. Electronic Signature(s) Signed: 01/13/2015 12:30:49 PM By: [name], [name] Entered By: [name] on 01/13/2015 11:25:59 Vanbrocklin, [name] ([id number]) -------------------------------------------------------------------------------- SuperBill Details [name]. Date of Service: 01/13/2015 Medical Record Number: [id number] Patient Account Number: [id number] Date of Birth/Sex: 25-Aug-1955 (59 y.o. Female) Treating RN: Primary Care Physician: Steele Sizer Other Clinician: Referring Physician: Steele Sizer Treating Physician/Extender: Frann Rider in Treatment: 3 Diagnosis Coding ICD-10 Codes Code Description [phone] Type 2 diabetes mellitus with other skin ulcer L89.112 Pressure ulcer of right upper back, stage 2 S80.821A Blister (nonthermal), right lower leg, initial encounter N18.6 End stage renal disease Facility Procedures CPT4 Code: [id number] Description: [phone] - DEBRIDE WOUND 1ST 20 SQ CM OR < ICD-10 Description Diagnosis E11.622 Type 2 diabetes mellitus with other skin ulcer L89.112 Pressure ulcer of right upper back, stage 2 S80.821A Blister (nonthermal), right lower leg, initial encou  N18.6 End stage renal disease Modifier: nter Quantity: 1 Physician Procedures CPT4  Code: [id number] Description: 25427 - WC PHYS DEBR WO ANESTH 20 SQ CM ICD-10 Description Diagnosis E11.622 Type 2 diabetes mellitus with other skin ulcer L89.112 Pressure ulcer of right upper back, stage 2 S80.821A Blister (nonthermal), right lower leg, initial encou  N18.6 End stage renal disease Modifier: nter Quantity: 1 Electronic Signature(s) Signed: 01/13/2015 12:30:49 PM By: [name], [name] Entered By: [name] on 01/13/2015 11:26:13

## 2015-01-14 NOTE — Progress Notes (Signed)
AAYANA, REINERTSEN ([id number]) Visit Report for 01/13/2015 Arrival Information Details [name], [name]. Date of Service: 01/13/2015 11:00 AM Medical Record Number: [id number] Patient Account Number: [id number] Date of Birth/Sex: [birth year] (59 y.o. Female) Treating RN: Montey Hora Primary Care Physician: Steele Sizer Other Clinician: Referring Physician: Steele Sizer Treating Physician/Extender: Frann Rider in Treatment: 3 Visit Information History Since Last Visit Added or deleted any medications: No Patient Arrived: Walker Any new allergies or adverse reactions: No Arrival Time: 11:02 Had a fall or experienced change in No Accompanied By: self activities of daily living that may affect Transfer Assistance: None risk of falls: Patient Identification Verified: Yes Signs or symptoms of abuse/neglect since last No Secondary Verification Process Yes visito Completed: Hospitalized since last visit: No Patient Requires Transmission- No Pain Present Now: No Based Precautions: Patient Has Alerts: Yes Patient Alerts: Patient on Blood Thinner ABI R: 1.28 Electronic Signature(s) Signed: 01/13/2015 4:37:27 PM By: Montey Hora Entered By: Montey Hora on 01/13/2015 11:06:02 Scheffler, [name] ([id number]) -------------------------------------------------------------------------------- Encounter Discharge Information Details Patient Name: [name], [name] Date of Service: 01/13/2015 11:00 AM Medical Record Number: [id number] Patient Account Number: [id number] Date of Birth/Sex: [birth year] (59 y.o. Female) Treating RN: Montey Hora Primary Care Physician: Steele Sizer Other Clinician: Referring Physician: Steele Sizer Treating Physician/Extender: Frann Rider in Treatment: 3 Encounter Discharge Information Items Discharge Pain Level: 0 Discharge Condition: Stable Ambulatory Status: [place] Discharge  Destination: Home Private Transportation: Auto Accompanied By: self Schedule Follow-up Appointment: Yes Medication Reconciliation completed and No provided to Patient/Care Neftaly Inzunza: Clinical Summary of Care: Electronic Signature(s) Signed: 01/13/2015 4:37:27 PM By: Montey Hora Entered By: Montey Hora on 01/13/2015 12:31:14 Helget, [name] ([id number]) -------------------------------------------------------------------------------- Multi Wound Chart Details [name]. Date of Service: 01/13/2015 11:00 AM Medical Record Number: [id number] Patient Account Number: [id number] Date of Birth/Sex: 07-27-1955 (59 y.o. Female) Treating RN: Cornell Barman Primary Care Physician: Steele Sizer Other Clinician: Referring Physician: Steele Sizer Treating Physician/Extender: Frann Rider in Treatment: 3 Vital Signs Height(in): 65 Pulse(bpm): 81 Weight(lbs): 156 Blood Pressure 147/75 (mmHg): Body Mass Index(BMI): 26 Temperature(F): 98.2 Respiratory Rate 16 (breaths/min): Photos: [1:No Photos] [N/A:N/A] Wound Location: [1:Right Back] [N/A:N/A] Wounding Event: [1:Blister] [N/A:N/A] Primary Etiology: [1:Pressure Ulcer] [N/A:N/A] Comorbid History: [1:Glaucoma, Anemia, Coronary Artery Disease, Hypertension, Type II Diabetes, End Stage Renal Disease, Osteoarthritis, Neuropathy] [N/A:N/A] Date Acquired: [1:10/19/2013] [N/A:N/A] Weeks of Treatment: [1:3] [N/A:N/A] Wound Status: [1:Open] [N/A:N/A] Measurements L x W x D 3.7x6.4x0.1 [N/A:N/A] (cm) Area (cm) : [1:18.598] [N/A:N/A] Volume (cm) : [1:1.86] [N/A:N/A] % Reduction in Area: [1:51.70%] [N/A:N/A] % Reduction in Volume: 51.70% [N/A:N/A] Classification: [1:Category/Stage II] [N/A:N/A] Exudate Amount: [1:Medium] [N/A:N/A] Exudate Type: [1:Serosanguineous] [N/A:N/A] Exudate Color: [1:red, brown] [N/A:N/A] Wound Margin: [1:Distinct, outline attached N/A] Granulation Amount: [1:Large (67-100%)]  [N/A:N/A] Granulation Quality: [1:Pink] [N/A:N/A] Necrotic Amount: [1:None Present (0%)] [N/A:N/A] Exposed Structures: [1:Fascia: No Fat: No Tendon: No] [N/A:N/A] Muscle: No Joint: No Bone: No Limited to Skin Breakdown Epithelialization: Medium (34-66%) N/A N/A Periwound Skin Texture: Edema: No N/A N/A Excoriation: No Induration: No Callus: No Crepitus: No Fluctuance: No Friable: No Rash: No Scarring: No Periwound Skin Moist: Yes N/A N/A Moisture: Maceration: No Dry/Scaly: No Periwound Skin Color: Atrophie Blanche: No N/A N/A Cyanosis: No Ecchymosis: No Erythema: No Hemosiderin Staining: No Mottled: No Pallor: No Rubor: No Temperature: No Abnormality N/A N/A Tenderness on No N/A N/A Palpation: Wound Preparation: Ulcer Cleansing: N/A N/A Rinsed/Irrigated with Saline Topical Anesthetic Applied: Other: lidocaine 4% Treatment Notes Electronic Signature(s) Signed: 01/13/2015 4:58:02  PM By: Gretta Cool, RN, [name], Kim RN, [name] Entered By: Gretta Cool, RN, [name], Kim on 01/13/2015 11:19:57 Sargeant, [name] ([id number]) -------------------------------------------------------------------------------- Multi-Disciplinary Care Plan Details [name]. Date of Service: 01/13/2015 11:00 AM Medical Record Number: [id number] Patient Account Number: [id number] Date of Birth/Sex: [birth year] (59 y.o. Female) Treating RN: Cornell Barman Primary Care Physician: Steele Sizer Other Clinician: Referring Physician: Steele Sizer Treating Physician/Extender: Frann Rider in Treatment: 3 Active Inactive Abuse / Safety / Falls / Self Care Management Nursing Diagnoses: Potential for falls Goals: Patient will remain injury free Date Initiated: 12/23/2014 Goal Status: Active Patient/caregiver will verbalize understanding of skin care regimen Date Initiated: 12/23/2014 Goal Status: Active Patient/caregiver will verbalize/demonstrate measures taken to prevent injury and/or  falls Date Initiated: 12/23/2014 Goal Status: Active Patient/caregiver will verbalize/demonstrate understanding of what to do in case of emergency Date Initiated: 12/23/2014 Goal Status: Active Interventions: Assess fall risk on admission and as needed Assess: immobility, friction, shearing, incontinence upon admission and as needed Assess impairment of mobility on admission and as needed per policy Assess self care needs on admission and as needed Provide education on fall prevention Notes: Wound/Skin Impairment Nursing Diagnoses: Impaired tissue integrity Knowledge deficit related to ulceration/compromised skin integrity Goals: Patient/caregiver will verbalize understanding of skin care regimen LAELANI, VASKO ([id number]) Date Initiated: 12/23/2014 Goal Status: Active Ulcer/skin breakdown will heal within 14 weeks Date Initiated: 12/23/2014 Goal Status: Active Interventions: Assess patient/caregiver ability to obtain necessary supplies Assess patient/caregiver ability to perform ulcer/skin care regimen upon admission and as needed Assess ulceration(s) every visit Provide education on ulcer and skin care Treatment Activities: Skin care regimen initiated : 01/13/2015 Topical wound management initiated : 01/13/2015 Notes: Electronic Signature(s) Signed: 01/13/2015 4:58:02 PM By: Gretta Cool, RN, [name], Kim RN, [name] Entered By: Gretta Cool, RN, [name], Kim on 01/13/2015 11:19:48 Sussman, [name] ([id number]) -------------------------------------------------------------------------------- Patient/Caregiver Education Details [name]. Date of Service: 01/13/2015 11:00 AM Medical Record Number: [id number] Patient Account Number: [id number] Date of Birth/Gender: [birth year] (59 y.o. Female) Treating RN: Montey Hora Primary Care Physician: Steele Sizer Other Clinician: Referring Physician: Steele Sizer Treating Physician/Extender: Frann Rider in Treatment: 3 Education  Assessment Education Provided To: Patient Education Topics Provided Pressure: Handouts: Other: offloading and shifting positions even in HD Methods: Demonstration, Explain/Verbal Responses: State content correctly Electronic Signature(s) Signed: 01/13/2015 4:37:27 PM By: Montey Hora Entered By: Montey Hora on 01/13/2015 12:31:37 Jurney, [name] ([id number]) -------------------------------------------------------------------------------- Wound Assessment Details Patient Name: DTOIZ, Fae S. Date of Service: 01/13/2015 11:00 AM Medical Record Number: [id number] Patient Account Number: [id number] Date of Birth/Sex: 02-16-1956 (59 y.o. Female) Treating RN: Montey Hora Primary Care Physician: Steele Sizer Other Clinician: Referring Physician: Steele Sizer Treating Physician/Extender: Frann Rider in Treatment: 3 Wound Status Wound Number: 1 Primary Pressure Ulcer Etiology: Wound Location: Right Back Wound Open Wounding Event: Blister Status: Date Acquired: 10/19/2013 Comorbid Glaucoma, Anemia, Coronary Artery Weeks Of Treatment: 3 History: Disease, Hypertension, Type II Clustered Wound: No Diabetes, End Stage Renal Disease, Osteoarthritis, Neuropathy Photos Photo Uploaded By: Montey Hora on 01/13/2015 11:33:01 Wound Measurements Length: (cm) 3.7 Width: (cm) 6.4 Depth: (cm) 0.1 Area: (cm) 18.598 Volume: (cm) 1.86 % Reduction in Area: 51.7% % Reduction in Volume: 51.7% Epithelialization: Medium (34-66%) Tunneling: No Undermining: No Wound Description Classification: Category/Stage II Wound Margin: Distinct, outline attached Exudate Amount: Medium Exudate Type: Serosanguineous Exudate Color: red, brown Foul Odor After Cleansing: No Wound Bed Granulation Amount: Large (67-100%) Exposed Structure Granulation Quality: Pink Fascia Exposed: No Necrotic Amount: None  Present (0%) Fat Layer Exposed: No Mcgeachy, Leathia S. ([id number]) Tendon  Exposed: No Muscle Exposed: No Joint Exposed: No Bone Exposed: No Limited to Skin Breakdown Periwound Skin Texture Texture Color No Abnormalities Noted: No No Abnormalities Noted: No Callus: No Atrophie Blanche: No Crepitus: No Cyanosis: No Excoriation: No Ecchymosis: No Fluctuance: No Erythema: No Friable: No Hemosiderin Staining: No Induration: No Mottled: No Localized Edema: No Pallor: No Rash: No Rubor: No Scarring: No Temperature / Pain Moisture Temperature: No Abnormality No Abnormalities Noted: No Dry / Scaly: No Maceration: No Moist: Yes Wound Preparation Ulcer Cleansing: Rinsed/Irrigated with Saline Topical Anesthetic Applied: Other: lidocaine 4%, Treatment Notes Wound #1 (Right Back) 1. Cleansed with: Clean wound with Normal Saline 2. Anesthetic Topical Lidocaine 4% cream to wound bed prior to debridement 4. Dressing Applied: Aquacel Ag 5. Secondary Dressing Applied Bordered Foam Dressing Electronic Signature(s) Signed: 01/13/2015 4:37:27 PM By: Montey Hora Entered By: Montey Hora on 01/13/2015 11:13:47 Arentz, [name] ([id number]) -------------------------------------------------------------------------------- Vitals Details [name]. Date of Service: 01/13/2015 11:00 AM Medical Record Number: [id number] Patient Account Number: [id number] Date of Birth/Sex: 1956-01-19 (59 y.o. Female) Treating RN: Montey Hora Primary Care Physician: Steele Sizer Other Clinician: Referring Physician: Steele Sizer Treating Physician/Extender: Frann Rider in Treatment: 3 Vital Signs Time Taken: 11:06 Temperature (F): 98.2 Height (in): 65 Pulse (bpm): 81 Weight (lbs): 156 Respiratory Rate (breaths/min): 16 Body Mass Index (BMI): 26 Blood Pressure (mmHg): 147/75 Reference Range: 80 - 120 mg / dl Electronic Signature(s) Signed: 01/13/2015 4:37:27 PM By: Montey Hora Entered By: Montey Hora on 01/13/2015  11:07:08

## 2015-01-22 ENCOUNTER — Encounter: Payer: Medicare Other | Attending: Surgery | Admitting: Surgery

## 2015-01-22 DIAGNOSIS — E785 Hyperlipidemia, unspecified: Secondary | ICD-10-CM | POA: Insufficient documentation

## 2015-01-22 DIAGNOSIS — I120 Hypertensive chronic kidney disease with stage 5 chronic kidney disease or end stage renal disease: Secondary | ICD-10-CM | POA: Insufficient documentation

## 2015-01-22 DIAGNOSIS — I2510 Atherosclerotic heart disease of native coronary artery without angina pectoris: Secondary | ICD-10-CM | POA: Diagnosis not present

## 2015-01-22 DIAGNOSIS — S80821A Blister (nonthermal), right lower leg, initial encounter: Secondary | ICD-10-CM | POA: Insufficient documentation

## 2015-01-22 DIAGNOSIS — L89112 Pressure ulcer of right upper back, stage 2: Secondary | ICD-10-CM | POA: Insufficient documentation

## 2015-01-22 DIAGNOSIS — X58XXXA Exposure to other specified factors, initial encounter: Secondary | ICD-10-CM | POA: Diagnosis not present

## 2015-01-22 DIAGNOSIS — D649 Anemia, unspecified: Secondary | ICD-10-CM | POA: Insufficient documentation

## 2015-01-22 DIAGNOSIS — Z992 Dependence on renal dialysis: Secondary | ICD-10-CM | POA: Diagnosis not present

## 2015-01-22 DIAGNOSIS — E11622 Type 2 diabetes mellitus with other skin ulcer: Secondary | ICD-10-CM | POA: Insufficient documentation

## 2015-01-22 DIAGNOSIS — N186 End stage renal disease: Secondary | ICD-10-CM | POA: Insufficient documentation

## 2015-01-23 NOTE — Progress Notes (Signed)
[name], [name] ([id number]) Visit Report for 01/22/2015 Arrival Information Details [name], [name]. Date of Service: 01/22/2015 10:15 AM Medical Record Number: [id number] Patient Account Number: [id number] Date of Birth/Sex: 1955/11/14 (59 y.o. Female) Treating RN: Baruch Gouty, RN, [name], [name] Primary Care Physician: Steele Sizer Other Clinician: Referring Physician: Steele Sizer Treating Physician/Extender: Frann Rider in Treatment: 4 Visit Information History Since Last Visit Added or deleted any medications: No Patient Arrived: [name] Any new allergies or adverse reactions: No Arrival Time: 10:29 Had a fall or experienced change in No Accompanied By: self activities of daily living that may affect Transfer Assistance: None risk of falls: Patient Identification Verified: Yes Signs or symptoms of abuse/neglect since last No Secondary Verification Process Yes visito Completed: Hospitalized since last visit: No Patient Requires Transmission- No Has Dressing in Place as Prescribed: Yes Based Precautions: Pain Present Now: No Patient Has Alerts: Yes Patient Alerts: Patient on Blood Thinner ABI R: 1.28 Electronic Signature(s) Signed: 01/22/2015 4:18:25 PM By: Regan Lemming [name], RN Entered By: Regan Lemming on 01/22/2015 10:32:34 [name], [name] ([id number]) -------------------------------------------------------------------------------- Encounter Discharge Information Details [name]. Date of Service: 01/22/2015 10:15 AM Medical Record Number: [id number] Patient Account Number: [id number] Date of Birth/Sex: [birth year] (59 y.o. Female) Treating RN: Baruch Gouty, RN, [name], [name] Primary Care Physician: Steele Sizer Other Clinician: Referring Physician: Steele Sizer Treating Physician/Extender: Frann Rider in Treatment: 4 Encounter Discharge Information Items Discharge Pain Level: 0 Discharge Condition: Stable Ambulatory Status:  Walker Discharge Destination: Home Private Transportation: Auto Accompanied By: self Schedule Follow-up Appointment: No Medication Reconciliation completed and No provided to Patient/Care Jordynne Mccown: Clinical Summary of Care: Electronic Signature(s) Signed: 01/22/2015 4:18:25 PM By: Regan Lemming [name], RN Entered By: Regan Lemming on 01/22/2015 10:55:00 Badeaux, [name] ([id number]) -------------------------------------------------------------------------------- Lower Extremity Assessment Details Patient Name: [name], [name] Date of Service: 01/22/2015 10:15 AM Medical Record Number: [id number] Patient Account Number: [id number] Date of Birth/Sex: Mar 24, 1956 (59 y.o. Female) Treating RN: [name], RN, [name], [name] Primary Care Physician: Steele Sizer Other Clinician: Referring Physician: Steele Sizer Treating Physician/Extender: Frann Rider in Treatment: 4 Electronic Signature(s) Signed: 01/22/2015 4:18:25 PM By: Regan Lemming [name], RN Entered By: Regan Lemming on 01/22/2015 10:34:21 Sciara, [name] ([id number]) -------------------------------------------------------------------------------- Multi Wound Chart Details Patient Name: [name], [name] Date of Service: 01/22/2015 10:15 AM Medical Record Number: [id number] Patient Account Number: [id number] Date of Birth/Sex: October 06, 1955 (59 y.o. Female) Treating RN: Baruch Gouty, RN, [name], [name] Primary Care Physician: Steele Sizer Other Clinician: Referring Physician: Steele Sizer Treating Physician/Extender: Frann Rider in Treatment: 4 Vital Signs Height(in): 65 Pulse(bpm): 91 Weight(lbs): 156 Blood Pressure 116/55 (mmHg): Body Mass Index(BMI): 26 Temperature(F): 98.3 Respiratory Rate 16 (breaths/min): Photos: [1:No Photos] [N/A:N/A] Wound Location: [1:Right Back] [N/A:N/A] Wounding Event: [1:Blister] [N/A:N/A] Primary Etiology: [1:Pressure Ulcer] [N/A:N/A] Comorbid History: [1:Glaucoma, Anemia, Coronary Artery Disease,  Hypertension, Type II Diabetes, End Stage Renal Disease, Osteoarthritis, Neuropathy] [N/A:N/A] Date Acquired: [1:10/19/2013] [N/A:N/A] Weeks of Treatment: [1:4] [N/A:N/A] Wound Status: [1:Open] [N/A:N/A] Measurements L x W x D 5.6x6.3x0.1 [N/A:N/A] (cm) Area (cm) : [1:27.709] [N/A:N/A] Volume (cm) : [1:2.771] [N/A:N/A] % Reduction in Area: [1:28.00%] [N/A:N/A] % Reduction in Volume: 28.00% [N/A:N/A] Classification: [1:Category/Stage II] [N/A:N/A] Exudate Amount: [1:Medium] [N/A:N/A] Exudate Type: [1:Serosanguineous] [N/A:N/A] Exudate Color: [1:red, brown] [N/A:N/A] Wound Margin: [1:Distinct, outline attached N/A] Granulation Amount: [1:Large (67-100%)] [N/A:N/A] Granulation Quality: [1:Pink] [N/A:N/A] Necrotic Amount: [1:None Present (0%)] [N/A:N/A] Exposed Structures: [1:Fascia: No Fat: No Tendon: No] [N/A:N/A] Muscle: No Joint: No Bone: No Limited to Skin Breakdown Epithelialization:  Medium (34-66%) N/A N/A Periwound Skin Texture: Edema: No N/A N/A Excoriation: No Induration: No Callus: No Crepitus: No Fluctuance: No Friable: No Rash: No Scarring: No Periwound Skin Moist: Yes N/A N/A Moisture: Maceration: No Dry/Scaly: No Periwound Skin Color: Atrophie Blanche: No N/A N/A Cyanosis: No Ecchymosis: No Erythema: No Hemosiderin Staining: No Mottled: No Pallor: No Rubor: No Temperature: No Abnormality N/A N/A Tenderness on No N/A N/A Palpation: Wound Preparation: Ulcer Cleansing: N/A N/A Rinsed/Irrigated with Saline Topical Anesthetic Applied: Other: lidocaine 4% Treatment Notes Electronic Signature(s) Signed: 01/22/2015 4:18:25 PM By: Regan Lemming [name], RN Entered By: Regan Lemming on 01/22/2015 10:40:20 Wigley, [name] ([id number]) -------------------------------------------------------------------------------- Multi-Disciplinary Care Plan Details [name]. Date of Service: 01/22/2015 10:15 AM Medical Record Number: [id number] Patient  Account Number: [id number] Date of Birth/Sex: Sep 17, 1955 (59 y.o. Female) Treating RN: Baruch Gouty, RN, [name], [name] Primary Care Physician: Steele Sizer Other Clinician: Referring Physician: Steele Sizer Treating Physician/Extender: Frann Rider in Treatment: 4 Active Inactive Abuse / Safety / Falls / Self Care Management Nursing Diagnoses: Potential for falls Goals: Patient will remain injury free Date Initiated: 12/23/2014 Goal Status: Active Patient/caregiver will verbalize understanding of skin care regimen Date Initiated: 12/23/2014 Goal Status: Active Patient/caregiver will verbalize/demonstrate measures taken to prevent injury and/or falls Date Initiated: 12/23/2014 Goal Status: Active Patient/caregiver will verbalize/demonstrate understanding of what to do in case of emergency Date Initiated: 12/23/2014 Goal Status: Active Interventions: Assess fall risk on admission and as needed Assess: immobility, friction, shearing, incontinence upon admission and as needed Assess impairment of mobility on admission and as needed per policy Assess self care needs on admission and as needed Provide education on fall prevention Notes: Wound/Skin Impairment Nursing Diagnoses: Impaired tissue integrity Knowledge deficit related to ulceration/compromised skin integrity Goals: Patient/caregiver will verbalize understanding of skin care regimen LATEASHA, BREUER ([id number]) Date Initiated: 12/23/2014 Goal Status: Active Ulcer/skin breakdown will heal within 14 weeks Date Initiated: 12/23/2014 Goal Status: Active Interventions: Assess patient/caregiver ability to obtain necessary supplies Assess patient/caregiver ability to perform ulcer/skin care regimen upon admission and as needed Assess ulceration(s) every visit Provide education on ulcer and skin care Treatment Activities: Skin care regimen initiated : 01/22/2015 Topical wound management initiated : 01/22/2015 Notes: Electronic  Signature(s) Signed: 01/22/2015 4:18:25 PM By: Regan Lemming [name], RN Entered By: Regan Lemming on 01/22/2015 10:34:58 Tinnel, [name] ([id number]) -------------------------------------------------------------------------------- Pain Assessment Details [name]. Date of Service: 01/22/2015 10:15 AM Medical Record Number: [id number] Patient Account Number: [id number] Date of Birth/Sex: November 12, 1955 (59 y.o. Female) Treating RN: Baruch Gouty, RN, [name], [name] Primary Care Physician: Steele Sizer Other Clinician: Referring Physician: Steele Sizer Treating Physician/Extender: Frann Rider in Treatment: 4 Active Problems Location of Pain Severity and Description of Pain Patient Has Paino No Site Locations Pain Management and Medication Current Pain Management: Electronic Signature(s) Signed: 01/22/2015 4:18:25 PM By: Regan Lemming [name], RN Entered By: Regan Lemming on 01/22/2015 10:32:40 Poullard, [name] ([id number]) -------------------------------------------------------------------------------- Patient/Caregiver Education Details [name]. Date of Service: 01/22/2015 10:15 AM Medical Record Number: [id number] Patient Account Number: [id number] Date of Birth/Gender: Dec 02, 1955 (59 y.o. Female) Treating RN: Baruch Gouty, RN, [name], [name] Primary Care Physician: Steele Sizer Other Clinician: Referring Physician: Steele Sizer Treating Physician/Extender: Frann Rider in Treatment: 4 Education Assessment Education Provided To: Patient Education Topics Provided Basic Hygiene: Methods: Explain/Verbal Responses: State content correctly Safety: Methods: Explain/Verbal Responses: State content correctly Wound/Skin Impairment: Methods: Explain/Verbal Responses: State content correctly Electronic Signature(s) Signed: 01/22/2015 4:18:25 PM By: Baruch Gouty,  [name] [name], RN Entered By: Regan Lemming on 01/22/2015 10:55:19 Preble, [name]  ([id number]) -------------------------------------------------------------------------------- Wound Assessment Details Patient Name: DORIA, FERN. Date of Service: 01/22/2015 10:15 AM Medical Record Number: [id number] Patient Account Number: [id number] Date of Birth/Sex: Jul 10, 1956 (59 y.o. Female) Treating RN: [name], RN, [name], [place] Primary Care Physician: Steele Sizer Other Clinician: Referring Physician: Steele Sizer Treating Physician/Extender: Frann Rider in Treatment: 4 Wound Status Wound Number: 1 Primary Pressure Ulcer Etiology: Wound Location: Right Back Wound Open Wounding Event: Blister Status: Date Acquired: 10/19/2013 Comorbid Glaucoma, Anemia, Coronary Artery Weeks Of Treatment: 4 History: Disease, Hypertension, Type II Clustered Wound: No Diabetes, End Stage Renal Disease, Osteoarthritis, Neuropathy Photos Photo Uploaded By: Regan Lemming on 01/22/2015 16:14:46 Wound Measurements Length: (cm) 5.6 Width: (cm) 6.3 Depth: (cm) 0.1 Area: (cm) 27.709 Volume: (cm) 2.771 % Reduction in Area: 28% % Reduction in Volume: 28% Epithelialization: Medium (34-66%) Tunneling: No Undermining: No Wound Description Classification: Category/Stage II Wound Margin: Distinct, outline attached Exudate Amount: Medium Exudate Type: Serosanguineous Exudate Color: red, brown Foul Odor After Cleansing: No Wound Bed Granulation Amount: Large (67-100%) Exposed Structure Granulation Quality: Pink Fascia Exposed: No Necrotic Amount: None Present (0%) Fat Layer Exposed: No Slaby, Jewell S. ([id number]) Tendon Exposed: No Muscle Exposed: No Joint Exposed: No Bone Exposed: No Limited to Skin Breakdown Periwound Skin Texture Texture Color No Abnormalities Noted: No No Abnormalities Noted: No Callus: No Atrophie Blanche: No Crepitus: No Cyanosis: No Excoriation: No Ecchymosis: No Fluctuance: No Erythema: No Friable: No Hemosiderin Staining: No Induration:  No Mottled: No Localized Edema: No Pallor: No Rash: No Rubor: No Scarring: No Temperature / Pain Moisture Temperature: No Abnormality No Abnormalities Noted: No Dry / Scaly: No Maceration: No Moist: Yes Wound Preparation Ulcer Cleansing: Rinsed/Irrigated with Saline Topical Anesthetic Applied: Other: lidocaine 4%, Treatment Notes Wound #1 (Right Back) 1. Cleansed with: Clean wound with Normal Saline 3. Peri-wound Care: Skin Prep 4. Dressing Applied: Prisma Ag 5. Secondary Dressing Applied Bordered Foam Dressing Dry Gauze 7. Secured with Recruitment consultant) Signed: 01/22/2015 4:18:25 PM By: Regan Lemming [name], RN Entered By: Regan Lemming on 01/22/2015 10:34:53 Tenny, [name] ([id number]) Henard, [name] ([id number]) -------------------------------------------------------------------------------- Vitals Details Patient Name: KWIOX, 68. Date of Service: 01/22/2015 10:15 AM Medical Record Number: [id number] Patient Account Number: [id number] Date of Birth/Sex: Oct 11, 1955 (59 y.o. Female) Treating RN: [name], RN, [name], [place] Primary Care Physician: Steele Sizer Other Clinician: Referring Physician: Steele Sizer Treating Physician/Extender: Frann Rider in Treatment: 4 Vital Signs Time Taken: 10:32 Temperature (F): 98.3 Height (in): 65 Pulse (bpm): 91 Weight (lbs): 156 Respiratory Rate (breaths/min): 16 Body Mass Index (BMI): 26 Blood Pressure (mmHg): 116/55 Reference Range: 80 - 120 mg / dl Electronic Signature(s) Signed: 01/22/2015 4:18:25 PM By: Regan Lemming [name], RN Entered By: Regan Lemming on 01/22/2015 10:34:17

## 2015-01-23 NOTE — Progress Notes (Signed)
RIDA, LOUDIN ([id number]) Visit Report for 01/22/2015 Chief Complaint Document Details [name], [name]. Date of Service: 01/22/2015 10:15 AM Medical Record Number: [id number] Patient Account Number: [id number] Date of Birth/Sex: 1955-12-05 (59 y.o. Female) Treating RN: Primary Care Physician: Steele Sizer Other Clinician: Referring Physician: Steele Sizer Treating Physician/Extender: Frann Rider in Treatment: 4 Information Obtained from: Patient Chief Complaint Patient presents to the wound care center for a consult due non healing wound. She has an open wound on her right upper back which she's had for about a year and she recently noticed a blister on her right lower extremity about 2 weeks ago. Electronic Signature(s) Signed: 01/22/2015 10:47:26 AM By: [name], [name] Entered By: [name] on 01/22/2015 10:47:26 [name], [name] ([id number]) -------------------------------------------------------------------------------- HPI Details [name], [name]. Date of Service: 01/22/2015 10:15 AM Medical Record Number: [id number] Patient Account Number: [id number] Date of Birth/Sex: 12/04/1955 (58 y.o. Female) Treating RN: Primary Care Physician: Steele Sizer Other Clinician: Referring Physician: Steele Sizer Treating Physician/Extender: Frann Rider in Treatment: 4 History of Present Illness Location: right upper back and right lower extremity wounds Quality: Patient reports No Pain. Severity: Patient states wound (s) are getting better. Duration: Patient has had the wound for > 3 months prior to seeking treatment at the wound center Timing: she thought it first occurred when she was using a heating pad about a year ago after back surgery. Context: The wound appeared gradually over time Modifying Factors: Patient is currently on renal dialysis and receives treatments 3 times weekly Associated Signs and Symptoms: Patient reports  having: surgery scheduled for this week for a AV fistula left arm. HPI Description: 59 year old patient who is known to be a diabetic and has end-stage renal disease has had several comorbidities including coronary artery disease, hypertension, hyperlipidemia, pancreatitis, anemia, previous history of hysterectomy, cholecystectomy, left-sided salivary gland excision, bilateral cataract surgery,Peritoneal dialysis catheter, hemodialysis catheter. the area on the back has also been caused by instant pressure she used to sleep on a recliner all day and has significant kyphoscoliosis. As far as the wound on her right lower extremity she's not sure how this blister occurred but she thought it has been there for about 2 weeks. No recent blood investigations available and no recent hemoglobin A1c. 12/30/2014 -- she is an assisted living facility but I believe the nurses that have not followed instructions as she had some cream applied on her back and there was a different dressing. Last week she's had a AV fistula placed on her left forearm. 01/13/2015 -- she has had some localized infection at the port site and she's been on doxycycline for this. Electronic Signature(s) Signed: 01/22/2015 10:47:34 AM By: [name], [name] Entered By: [name] on 01/22/2015 10:47:34 [name], [name] ([id number]) -------------------------------------------------------------------------------- Physical Exam Details [name], 50. Date of Service: 01/22/2015 10:15 AM Medical Record Number: [id number] Patient Account Number: [id number] Date of Birth/Sex: 07/23/1955 (59 y.o. Female) Treating RN: Primary Care Physician: Steele Sizer Other Clinician: Referring Physician: Steele Sizer Treating Physician/Extender: Frann Rider in Treatment: 4 Constitutional . Pulse regular. Respirations normal and unlabored. Afebrile. . Eyes Nonicteric. Reactive to light. Ears, Nose, Mouth, and  Throat Lips, teeth, and gums WNL.Marland Kitchen Moist mucosa without lesions . Neck supple and nontender. No palpable supraclavicular or cervical adenopathy. Normal sized without goiter. Respiratory WNL. No retractions.. Cardiovascular Pedal Pulses WNL. No clubbing, cyanosis or edema. Chest Breasts symmetical and no nipple discharge.. Breast tissue WNL, no  masses, lumps, or tenderness.. Musculoskeletal Adexa without tenderness or enlargement.. Digits and nails w/o clubbing, cyanosis, infection, petechiae, ischemia, or inflammatory conditions.. Integumentary (Hair, Skin) No suspicious lesions. No crepitus or fluctuance. No peri-wound warmth or erythema. No masses.Marland Kitchen Psychiatric Judgement and insight Intact.. No evidence of depression, anxiety, or agitation.. Notes The wound on the back is looking very clean has grown smaller in size and has good healthy granulation tissue. Electronic Signature(s) Signed: 01/22/2015 10:48:09 AM By: [name], [name] Entered By: [name] on 01/22/2015 10:48:09 [name], [name] ([id number]) -------------------------------------------------------------------------------- Physician Orders Details [name]. Date of Service: 01/22/2015 10:15 AM Medical Record Patient Account Number: [id number] Number: [name], RN, [name], Treating RN: 07/28/1955 (59 y.o. [name] Date of Birth/Sex: Female) Other Clinician: Primary Care Physician: Steele Sizer Treating [name] Referring Physician: Steele Sizer Physician/Extender: Suella Grove in Treatment: 4 Verbal / Phone Orders: Yes Clinician: [name], RN, [name], [name] Read Back and Verified: Yes Diagnosis Coding Wound Cleansing Wound #1 Right Back o May shower with protection. Skin Barriers/Peri-Wound Care Wound #1 Right Back o Skin Prep Primary Wound Dressing Wound #1 Right Back o Prisma Ag o Xeroform Secondary Dressing Wound #1 Right Back o Dry Gauze o Boardered Foam  Dressing Dressing Change Frequency Wound #1 Right Back o Change dressing every week - to be changed at [facility] next visit Follow-up Appointments Wound #1 Right Back o Return Appointment in 1 week. Electronic Signature(s) Signed: 01/22/2015 12:23:30 PM By: [name], [name] Signed: 01/22/2015 4:18:25 PM By: Regan Lemming [name], RN Entered By: Regan Lemming on 01/22/2015 10:43:07 [name], [name] ([id number]) -------------------------------------------------------------------------------- Problem List Details [name], RIVA. Date of Service: 01/22/2015 10:15 AM Medical Record Number: [id number] Patient Account Number: [id number] Date of Birth/Sex: 1956/04/20 (59 y.o. 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 ([id number]) -------------------------------------------------------------------------------- Progress Note Details [name], [name] Date of Service: 01/22/2015 10:15 AM Medical Record Number: [id number] Patient Account Number: [id number] Date of Birth/Sex: 1956/03/24 (59 y.o. Female) Treating RN: Primary Care Physician: Steele Sizer Other Clinician: Referring Physician: Steele Sizer Treating Physician/Extender: Frann Rider in Treatment: 4 Subjective Chief Complaint Information obtained from  Patient Patient presents to the wound care center for a consult due non healing wound. She has an open wound on her right upper back which she's had for about a year and she recently noticed a blister on her right lower extremity about 2 weeks ago. History of Present Illness (HPI) The following HPI elements were documented for the patient's wound: Location: right upper back and right lower extremity wounds Quality: Patient reports No Pain. Severity: Patient states wound (s) are getting better. Duration: Patient has had the wound for > 3 months prior to seeking treatment at the wound center Timing: she thought it first occurred when she was using a heating pad about a year ago after back surgery. Context: The wound appeared gradually over time Modifying Factors: Patient is currently on renal dialysis and receives treatments 3  times weekly Associated Signs and Symptoms: Patient reports having: surgery scheduled for this week for a AV fistula left arm. 59 year old patient who is known to be a diabetic and has end-stage renal disease has had several comorbidities including coronary artery disease, hypertension, hyperlipidemia, pancreatitis, anemia, previous history of hysterectomy, cholecystectomy, left-sided salivary gland excision, bilateral cataract surgery,Peritoneal dialysis catheter, hemodialysis catheter. the area on the back has also been caused by instant pressure she used to sleep on a recliner all day and has significant kyphoscoliosis. As far as the wound on her right lower extremity she's not sure how this blister occurred but she thought it has been there for about 2 weeks. No recent blood investigations available and no recent hemoglobin A1c. 12/30/2014 -- she is an assisted living facility but I believe the nurses that have not followed instructions as she had some cream applied on her back and there was a different dressing. Last week she's had a AV fistula placed on her left  forearm. 01/13/2015 -- she has had some localized infection at the port site and she's been on doxycycline for this. [name] ([id number]) Objective Constitutional Pulse regular. Respirations normal and unlabored. Afebrile. Vitals Time Taken: 10:32 AM, Height: 65 in, Weight: 156 lbs, BMI: 26, Temperature: 98.3 F, Pulse: 91 bpm, Respiratory Rate: 16 breaths/min, Blood Pressure: 116/55 mmHg. Eyes Nonicteric. Reactive to light. Ears, Nose, Mouth, and Throat Lips, teeth, and gums WNL.Marland Kitchen Moist mucosa without lesions . Neck supple and nontender. No palpable supraclavicular or cervical adenopathy. Normal sized without goiter. Respiratory WNL. No retractions.. Cardiovascular Pedal Pulses WNL. No clubbing, cyanosis or edema. Chest Breasts symmetical and no nipple discharge.. Breast tissue WNL, no masses, lumps, or tenderness.. Musculoskeletal Adexa without tenderness or enlargement.. Digits and nails w/o clubbing, cyanosis, infection, petechiae, ischemia, or inflammatory conditions.Marland Kitchen Psychiatric Judgement and insight Intact.. No evidence of depression, anxiety, or agitation.. General Notes: The wound on the back is looking very clean has grown smaller in size and has good healthy granulation tissue. Integumentary (Hair, Skin) No suspicious lesions. No crepitus or fluctuance. No peri-wound warmth or erythema. No masses.. Wound #1 status is Open. Original cause of wound was Blister. The wound is located on the Right Back. The wound measures 5.6cm length x 6.3cm width x 0.1cm depth; 27.709cm^2 area and 2.771cm^3 volume. The wound is limited to skin breakdown. There is no tunneling or undermining noted. There is a medium amount of serosanguineous drainage noted. The wound margin is distinct with the outline attached to the wound base. There is large (67-100%) pink granulation within the wound bed. There is no necrotic tissue within the wound bed. The periwound skin appearance exhibited:  Moist. The periwound skin appearance did not exhibit: Callus, Crepitus, Excoriation, Fluctuance, Friable, Induration, Localized Edema, Rash, Scarring, Dry/Scaly, Maceration, Atrophie Blanche, Cyanosis, Ecchymosis, Hemosiderin Staining, Mottled, Braithwaite, Timmy S. ([id number]) Pallor, Rubor, Erythema. Periwound temperature was noted as No Abnormality. Assessment Active Problems ICD-10 E11.622 - Type 2 diabetes mellitus with other skin ulcer L89.112 - Pressure ulcer of right upper back, stage 2 S80.821A - Blister (nonthermal), right lower leg, initial encounter N18.6 - End stage renal disease I have recommended a piece of Prisma over the wound a non-adherent dressing like Xeroform gauze and then an ABD pad and offloading completely for a week. If the dressing has not soaked she does not need to change it and she comes to see me next week. Plan Wound Cleansing: Wound #1 Right Back: May shower with protection. Skin Barriers/Peri-Wound Care: Wound #  1 Right Back: Skin Prep Primary Wound Dressing: Wound #1 Right Back: Prisma Ag Xeroform Secondary Dressing: Wound #1 Right Back: Dry Gauze Boardered Foam Dressing Dressing Change Frequency: Wound #1 Right Back: Change dressing every week - to be changed at [facility] next visit Follow-up Appointments: Wound #1 Right Back: Return Appointment in 1 week. [name], [name] ([id number]) I have recommended a piece of Prisma over the wound a non-adherent dressing like Xeroform gauze and then an ABD pad and offloading completely for a week. If the dressing has not soaked she does not need to change it and she comes to see me next week. Electronic Signature(s) Signed: 01/22/2015 10:49:03 AM By: [name], [name] Entered By: [name] on 01/22/2015 10:49:03 [name], [name] ([id number]) -------------------------------------------------------------------------------- SuperBill Details [name]. Date of Service: 01/22/2015 Medical  Record Number: [id number] Patient Account Number: [id number] Date of Birth/Sex: July 11, 1956 (59 y.o. Female) Treating RN: Primary Care Physician: Steele Sizer Other Clinician: Referring Physician: Steele Sizer Treating Physician/Extender: Frann Rider in Treatment: 4 Diagnosis Coding ICD-10 Codes Code Description [phone] Type 2 diabetes mellitus with other skin ulcer L89.112 Pressure ulcer of right upper back, stage 2 S80.821A Blister (nonthermal), right lower leg, initial encounter N18.6 End stage renal disease Physician Procedures CPT4 Code: [id number] Description: [id number] - WC PHYS LEVEL 3 - EST PT ICD-10 Description Diagnosis E11.622 Type 2 diabetes mellitus with other skin ulcer L89.112 Pressure ulcer of right upper back, stage 2 Modifier: Quantity: 1 Electronic Signature(s) Signed: 01/22/2015 10:49:21 AM By: [name], [name] Entered By: [name] on 01/22/2015 10:49:20

## 2015-01-29 ENCOUNTER — Encounter: Payer: Medicare Other | Admitting: Surgery

## 2015-01-29 DIAGNOSIS — L89112 Pressure ulcer of right upper back, stage 2: Secondary | ICD-10-CM | POA: Diagnosis not present

## 2015-01-30 NOTE — Progress Notes (Signed)
[name], [name] ([id number]) Visit Report for 01/29/2015 Arrival Information Details [name], [name]. Date of Service: 01/29/2015 8:45 AM Medical Record Number: [id number] Patient Account Number: [id number] Date of Birth/Sex: Aug 14, 1955 (59 y.o. Female) Treating RN: Montey Hora Primary Care Physician: Steele Sizer Other Clinician: Referring Physician: Steele Sizer Treating Physician/Extender: Frann Rider in Treatment: 5 Visit Information History Since Last Visit Added or deleted any medications: No Patient Arrived: Walker Any new allergies or adverse reactions: No Arrival Time: 09:18 Had a fall or experienced change in No Accompanied By: self activities of daily living that may affect Transfer Assistance: None risk of falls: Patient Identification Verified: Yes Signs or symptoms of abuse/neglect since last No Secondary Verification Process Yes visito Completed: Hospitalized since last visit: No Patient Requires Transmission- No Pain Present Now: No Based Precautions: Patient Has Alerts: Yes Patient Alerts: Patient on Blood Thinner ABI R: 1.28 Electronic Signature(s) Signed: 01/29/2015 5:45:58 PM By: Montey Hora Entered By: Montey Hora on 01/29/2015 09:22:03 Talbot, [name] ([id number]) -------------------------------------------------------------------------------- Clinic Level of Care Assessment Details [name]. Date of Service: 01/29/2015 8:45 AM Medical Record Number: [id number] Patient Account Number: [id number] Date of Birth/Sex: 17-Sep-1955 (59 y.o. Female) Treating RN: Montey Hora Primary Care Physician: Steele Sizer Other Clinician: Referring Physician: Steele Sizer Treating Physician/Extender: Frann Rider in Treatment: 5 Clinic Level of Care Assessment Items TOOL 4 Quantity Score []  - Use when only an EandM is performed on FOLLOW-UP visit 0 ASSESSMENTS - Nursing Assessment / Reassessment X  - Reassessment of Co-morbidities (includes updates in patient status) 1 10 X - Reassessment of Adherence to Treatment Plan 1 5 ASSESSMENTS - Wound and Skin Assessment / Reassessment X - Simple Wound Assessment / Reassessment - one wound 1 5 []  - Complex Wound Assessment / Reassessment - multiple wounds 0 []  - Dermatologic / Skin Assessment (not related to wound area) 0 ASSESSMENTS - Focused Assessment []  - Circumferential Edema Measurements - multi extremities 0 []  - Nutritional Assessment / Counseling / Intervention 0 []  - Lower Extremity Assessment (monofilament, tuning fork, pulses) 0 []  - Peripheral Arterial Disease Assessment (using hand held doppler) 0 ASSESSMENTS - Ostomy and/or Continence Assessment and Care []  - Incontinence Assessment and Management 0 []  - Ostomy Care Assessment and Management (repouching, etc.) 0 PROCESS - Coordination of Care X - Simple Patient / Family Education for ongoing care 1 15 []  - Complex (extensive) Patient / Family Education for ongoing care 0 []  - Staff obtains Programmer, systems, Records, Test Results / Process Orders 0 []  - Staff telephones HHA, Nursing Homes / Clarify orders / etc 0 []  - Routine Transfer to another Facility (non-emergent condition) 0 [name], [name] ([id number]) []  - Routine Hospital Admission (non-emergent condition) 0 []  - New Admissions / Biomedical engineer / Ordering NPWT, Apligraf, etc. 0 []  - Emergency Hospital Admission (emergent condition) 0 X - Simple Discharge Coordination 1 10 []  - Complex (extensive) Discharge Coordination 0 PROCESS - Special Needs []  - Pediatric / Minor Patient Management 0 []  - Isolation Patient Management 0 []  - Hearing / Language / Visual special needs 0 []  - Assessment of Community assistance (transportation, D/C planning, etc.) 0 []  - Additional assistance / Altered mentation 0 []  - Support Surface(s) Assessment (bed, cushion, seat, etc.) 0 INTERVENTIONS - Wound Cleansing / Measurement X -  Simple Wound Cleansing - one wound 1 5 []  - Complex Wound Cleansing - multiple wounds 0 X - Wound Imaging (photographs - any number of wounds) 1 5 []  -  Wound Tracing (instead of photographs) 0 X - Simple Wound Measurement - one wound 1 5 []  - Complex Wound Measurement - multiple wounds 0 INTERVENTIONS - Wound Dressings X - Small Wound Dressing one or multiple wounds 1 10 []  - Medium Wound Dressing one or multiple wounds 0 []  - Large Wound Dressing one or multiple wounds 0 []  - Application of Medications - topical 0 []  - Application of Medications - injection 0 INTERVENTIONS - Miscellaneous []  - External ear exam 0 [name], [name] ([id number]) []  - Specimen Collection (cultures, biopsies, blood, body fluids, etc.) 0 []  - Specimen(s) / Culture(s) sent or taken to Lab for analysis 0 []  - Patient Transfer (multiple staff / Harrel Lemon Lift / Similar devices) 0 []  - Simple Staple / Suture removal (25 or less) 0 []  - Complex Staple / Suture removal (26 or more) 0 []  - Hypo / Hyperglycemic Management (close monitor of Blood Glucose) 0 []  - Ankle / Brachial Index (ABI) - do not check if billed separately 0 X - Vital Signs 1 5 Has the patient been seen at the hospital within the last three years: Yes Total Score: 75 Level Of Care: New/Established - Level 2 Electronic Signature(s) Signed: 01/29/2015 9:48:57 AM By: Montey Hora Entered By: Montey Hora on 01/29/2015 09:48:57 [name], [name] ([id number]) -------------------------------------------------------------------------------- Encounter Discharge Information Details [name]. Date of Service: 01/29/2015 8:45 AM Medical Record Number: [id number] Patient Account Number: [id number] Date of Birth/Sex: 08-17-1955 (59 y.o. Female) Treating RN: Montey Hora Primary Care Physician: Steele Sizer Other Clinician: Referring Physician: Steele Sizer Treating Physician/Extender: Frann Rider in Treatment: 5 Encounter  Discharge Information Items Discharge Pain Level: 0 Discharge Condition: Stable Ambulatory Status: Walker Discharge Destination: Home Private Transportation: Auto Accompanied By: self Schedule Follow-up Appointment: Yes Medication Reconciliation completed and No provided to Patient/Care [place]: Clinical Summary of Care: Electronic Signature(s) Signed: 01/29/2015 9:49:57 AM By: Montey Hora Entered By: Montey Hora on 01/29/2015 09:49:57 Divita, [name] ([id number]) -------------------------------------------------------------------------------- Multi Wound Chart Details [name]. Date of Service: 01/29/2015 8:45 AM Medical Record Number: [id number] Patient Account Number: [id number] Date of Birth/Sex: May 20, 1956 (59 y.o. Female) Treating RN: Montey Hora Primary Care Physician: Steele Sizer Other Clinician: Referring Physician: Steele Sizer Treating Physician/Extender: Frann Rider in Treatment: 5 Vital Signs Height(in): 65 Pulse(bpm): 82 Weight(lbs): 156 Blood Pressure 188/78 (mmHg): Body Mass Index(BMI): 26 Temperature(F): 98.4 Respiratory Rate 16 (breaths/min): Photos: [1:No Photos] [N/A:N/A] Wound Location: [1:Right Back] [N/A:N/A] Wounding Event: [1:Blister] [N/A:N/A] Primary Etiology: [1:Pressure Ulcer] [N/A:N/A] Comorbid History: [1:Glaucoma, Anemia, Coronary Artery Disease, Hypertension, Type II Diabetes, End Stage Renal Disease, Osteoarthritis, Neuropathy] [N/A:N/A] Date Acquired: [1:10/19/2013] [N/A:N/A] Weeks of Treatment: [1:5] [N/A:N/A] Wound Status: [1:Open] [N/A:N/A] Measurements L x W x D 5.3x4.4x0.1 [N/A:N/A] (cm) Area (cm) : [1:18.315] [N/A:N/A] Volume (cm) : [1:1.832] [N/A:N/A] % Reduction in Area: [1:52.40%] [N/A:N/A] % Reduction in Volume: 52.40% [N/A:N/A] Classification: [1:Category/Stage II] [N/A:N/A] Exudate Amount: [1:Medium] [N/A:N/A] Exudate Type: [1:Serosanguineous] [N/A:N/A] Exudate Color:  [1:red, brown] [N/A:N/A] Wound Margin: [1:Distinct, outline attached N/A] Granulation Amount: [1:Large (67-100%)] [N/A:N/A] Granulation Quality: [1:Pink] [N/A:N/A] Necrotic Amount: [1:None Present (0%)] [N/A:N/A] Exposed Structures: [1:Fascia: No Fat: No Tendon: No] [N/A:N/A] Muscle: No Joint: No Bone: No Limited to Skin Breakdown Epithelialization: Medium (34-66%) N/A N/A Periwound Skin Texture: Edema: No N/A N/A Excoriation: No Induration: No Callus: No Crepitus: No Fluctuance: No Friable: No Rash: No Scarring: No Periwound Skin Moist: Yes N/A N/A Moisture: Maceration: No Dry/Scaly: No Periwound Skin Color: Atrophie Blanche: No N/A  N/A Cyanosis: No Ecchymosis: No Erythema: No Hemosiderin Staining: No Mottled: No Pallor: No Rubor: No Temperature: No Abnormality N/A N/A Tenderness on No N/A N/A Palpation: Wound Preparation: Ulcer Cleansing: N/A N/A Rinsed/Irrigated with Saline Topical Anesthetic Applied: None Treatment Notes Electronic Signature(s) Signed: 01/29/2015 5:45:58 PM By: Montey Hora Entered By: Montey Hora on 01/29/2015 09:28:04 [place]ia, [name] ([id number]) -------------------------------------------------------------------------------- [facility] Details [name], [name]. Date of Service: 01/29/2015 8:45 AM Medical Record Number: [id number] Patient Account Number: [id number] Date of Birth/Sex: 1956/02/16 (59 y.o. Female) Treating RN: Montey Hora Primary Care Physician: Steele Sizer Other Clinician: Referring Physician: Steele Sizer Treating Physician/Extender: Frann Rider in Treatment: 5 Active Inactive Abuse / Safety / Falls / Self Care Management Nursing Diagnoses: Potential for falls Goals: Patient will remain injury free Date Initiated: 12/23/2014 Goal Status: Active Patient/caregiver will verbalize understanding of skin care regimen Date Initiated: 12/23/2014 Goal Status:  Active Patient/caregiver will verbalize/demonstrate measures taken to prevent injury and/or falls Date Initiated: 12/23/2014 Goal Status: Active Patient/caregiver will verbalize/demonstrate understanding of what to do in case of emergency Date Initiated: 12/23/2014 Goal Status: Active Interventions: Assess fall risk on admission and as needed Assess: immobility, friction, shearing, incontinence upon admission and as needed Assess impairment of mobility on admission and as needed per policy Assess self care needs on admission and as needed Provide education on fall prevention Notes: Wound/Skin Impairment Nursing Diagnoses: Impaired tissue integrity Knowledge deficit related to ulceration/compromised skin integrity Goals: Patient/caregiver will verbalize understanding of skin care regimen [name], [name] ([id number]) Date Initiated: 12/23/2014 Goal Status: Active Ulcer/skin breakdown will heal within 14 weeks Date Initiated: 12/23/2014 Goal Status: Active Interventions: Assess patient/caregiver ability to obtain necessary supplies Assess patient/caregiver ability to perform ulcer/skin care regimen upon admission and as needed Assess ulceration(s) every visit Provide education on ulcer and skin care Treatment Activities: Skin care regimen initiated : 01/29/2015 Topical wound management initiated : 01/29/2015 Notes: Electronic Signature(s) Signed: 01/29/2015 5:45:58 PM By: Montey Hora Entered By: Montey Hora on 01/29/2015 09:27:55 [name], [name] ([id number]) -------------------------------------------------------------------------------- Patient/Caregiver Education Details [name]. Date of Service: 01/29/2015 8:45 AM Medical Record Number: [id number] Patient Account Number: [id number] Date of Birth/Gender: 1956-02-07 (59 y.o. Female) Treating RN: Montey Hora Primary Care Physician: Steele Sizer Other Clinician: Referring Physician: Steele Sizer Treating Physician/Extender: Frann Rider in Treatment: 5 Education Assessment Education Provided To: Patient Education Topics Provided Offloading: Handouts: Other: continue offloading and do not remove dressing unless soiled Methods: Explain/Verbal Responses: State content correctly Electronic Signature(s) Signed: 01/29/2015 9:50:23 AM By: Montey Hora Entered By: Montey Hora on 01/29/2015 09:50:23 [name], [name] ([id number]) -------------------------------------------------------------------------------- Wound Assessment Details [name], [name] Date of Service: 01/29/2015 8:45 AM Medical Record Number: [id number] Patient Account Number: [id number] Date of Birth/Sex: 1956/07/17 (59 y.o. Female) Treating RN: Montey Hora Primary Care Physician: Steele Sizer Other Clinician: Referring Physician: Steele Sizer Treating Physician/Extender: Frann Rider in Treatment: 5 Wound Status Wound Number: 1 Primary Pressure Ulcer Etiology: Wound Location: Right Back Wound Open Wounding Event: Blister Status: Date Acquired: 10/19/2013 Comorbid Glaucoma, Anemia, Coronary Artery Weeks Of Treatment: 5 History: Disease, Hypertension, Type II Clustered Wound: No Diabetes, End Stage Renal Disease, Osteoarthritis, Neuropathy Photos Photo Uploaded By: Gretta Cool, RN, [name], Kim on 01/29/2015 17:43:13 Wound Measurements Length: (cm) 5.3 Width: (cm) 4.4 Depth: (cm) 0.1 Area: (cm) 18.315 Volume: (cm) 1.832 % Reduction in Area: 52.4% % Reduction in Volume: 52.4% Epithelialization: Medium (34-66%) Tunneling: No Undermining: No Wound Description Classification: Category/Stage  II Wound Margin: Distinct, outline attached Exudate Amount: Medium Exudate Type: Serosanguineous Exudate Color: red, brown Foul Odor After Cleansing: No Wound Bed Granulation Amount: Large (67-100%) Exposed Structure Granulation Quality: Pink Fascia Exposed: No Necrotic  Amount: None Present (0%) Fat Layer Exposed: No [name], [name] ([id number]) Tendon Exposed: No Muscle Exposed: No Joint Exposed: No Bone Exposed: No Limited to Skin Breakdown Periwound Skin Texture Texture Color No Abnormalities Noted: No No Abnormalities Noted: No Callus: No Atrophie Blanche: No Crepitus: No Cyanosis: No Excoriation: No Ecchymosis: No Fluctuance: No Erythema: No Friable: No Hemosiderin Staining: No Induration: No Mottled: No Localized Edema: No Pallor: No Rash: No Rubor: No Scarring: No Temperature / Pain Moisture Temperature: No Abnormality No Abnormalities Noted: No Dry / Scaly: No Maceration: No Moist: Yes Wound Preparation Ulcer Cleansing: Rinsed/Irrigated with Saline Topical Anesthetic Applied: None Treatment Notes Wound #1 (Right Back) 1. Cleansed with: Clean wound with Normal Saline 2. Anesthetic Topical Lidocaine 4% cream to wound bed prior to debridement 3. Peri-wound Care: Skin Prep 4. Dressing Applied: Prisma Ag Contact layer 5. Secondary Dressing Applied Bordered Foam Dressing Dry Gauze 7. Secured with Recruitment consultant) Signed: 01/29/2015 5:45:58 PM By: Posey Pronto ([id number]) Entered By: Montey Hora on 01/29/2015 09:27:50 Spagnuolo, [name] ([id number]) -------------------------------------------------------------------------------- Vitals Details Patient Name: ENES, WEGENER. Date of Service: 01/29/2015 8:45 AM Medical Record Number: [id number] Patient Account Number: [id number] Date of Birth/Sex: [birth year] (59 y.o. Female) Treating RN: Montey Hora Primary Care Physician: Steele Sizer Other Clinician: Referring Physician: Steele Sizer Treating Physician/Extender: Frann Rider in Treatment: 5 Vital Signs Time Taken: 09:20 Temperature (F): 98.4 Height (in): 65 Pulse (bpm): 82 Weight (lbs): 156 Respiratory Rate (breaths/min): 16 Body Mass Index (BMI): 26 Blood  Pressure (mmHg): 188/78 Reference Range: 80 - 120 mg / dl Electronic Signature(s) Signed: 01/29/2015 5:45:58 PM By: Montey Hora Entered By: Montey Hora on 01/29/2015 36:12:24

## 2015-01-30 NOTE — Progress Notes (Signed)
SHAASIA, ODLE ([id number]) Visit Report for 01/29/2015 Chief Complaint Document Details [name], [name]. Date of Service: 01/29/2015 8:45 AM Medical Record Number: [id number] Patient Account Number: [id number] Date of Birth/Sex: 30-Jul-1955 (59 y.o. Female) Treating RN: Primary Care Physician: Steele Sizer Other Clinician: Referring Physician: Steele Sizer Treating Physician/Extender: Frann Rider in Treatment: 5 Information Obtained from: Patient Chief Complaint Patient presents to the wound care center for a consult due non healing wound. She has an open wound on her right upper back which she's had for about a year and she recently noticed a blister on her right lower extremity about 2 weeks ago. Electronic Signature(s) Signed: 01/29/2015 9:39:49 AM By: [name], [name] Entered By: [name] on 01/29/2015 09:39:49 Kerchner, [name] ([id number]) -------------------------------------------------------------------------------- HPI Details [name], [name]. Date of Service: 01/29/2015 8:45 AM Medical Record Number: [id number] Patient Account Number: [id number] Date of Birth/Sex: Jun 10, 1956 (59 y.o. Female) Treating RN: Primary Care Physician: Steele Sizer Other Clinician: Referring Physician: Steele Sizer Treating Physician/Extender: Frann Rider in Treatment: 5 History of Present Illness Location: right upper back and right lower extremity wounds Quality: Patient reports No Pain. Severity: Patient states wound (s) are getting better. Duration: Patient has had the wound for > 3 months prior to seeking treatment at the wound center Timing: she thought it first occurred when she was using a heating pad about a year ago after back surgery. Context: The wound appeared gradually over time Modifying Factors: Patient is currently on renal dialysis and receives treatments 3 times weekly Associated Signs and Symptoms: Patient reports  having: surgery scheduled for this week for a AV fistula left arm. HPI Description: 59 year old patient who is known to be a diabetic and has end-stage renal disease has had several comorbidities including coronary artery disease, hypertension, hyperlipidemia, pancreatitis, anemia, previous history of hysterectomy, cholecystectomy, left-sided salivary gland excision, bilateral cataract surgery,Peritoneal dialysis catheter, hemodialysis catheter. the area on the back has also been caused by instant pressure she used to sleep on a recliner all day and has significant kyphoscoliosis. As far as the wound on her right lower extremity she's not sure how this blister occurred but she thought it has been there for about 2 weeks. No recent blood investigations available and no recent hemoglobin A1c. 12/30/2014 -- she is an assisted living facility but I believe the nurses that have not followed instructions as she had some cream applied on her back and there was a different dressing. Last week she's had a AV fistula placed on her left forearm. 01/13/2015 -- she has had some localized infection at the port site and she's been on doxycycline for this. Electronic Signature(s) Signed: 01/29/2015 9:39:53 AM By: [name], [name] Entered By: [name] on 01/29/2015 09:39:53 Rochford, [name] ([id number]) -------------------------------------------------------------------------------- Physical Exam Details [name], [name] Date of Service: 01/29/2015 8:45 AM Medical Record Number: [id number] Patient Account Number: [id number] Date of Birth/Sex: 02-09-1956 (59 y.o. Female) Treating RN: Primary Care Physician: Steele Sizer Other Clinician: Referring Physician: Steele Sizer Treating Physician/Extender: Frann Rider in Treatment: 5 Constitutional . Pulse regular. Respirations normal and unlabored. Afebrile. . Eyes Nonicteric. Reactive to light. Ears, Nose, Mouth, and  Throat Lips, teeth, and gums WNL.Marland Kitchen Moist mucosa without lesions . Neck supple and nontender. No palpable supraclavicular or cervical adenopathy. Normal sized without goiter. Respiratory WNL. No retractions.. Cardiovascular Pedal Pulses WNL. No clubbing, cyanosis or edema. Chest Breasts symmetical and no nipple discharge.. Breast tissue WNL, no  masses, lumps, or tenderness.. Musculoskeletal Adexa without tenderness or enlargement.. Digits and nails w/o clubbing, cyanosis, infection, petechiae, ischemia, or inflammatory conditions.. Integumentary (Hair, Skin) No suspicious lesions. No crepitus or fluctuance. No peri-wound warmth or erythema. No masses.Marland Kitchen Psychiatric Judgement and insight Intact.. No evidence of depression, anxiety, or agitation.. Notes the wound is small and is looking excellent with good granulation tissue. Electronic Signature(s) Signed: 01/29/2015 9:40:19 AM By: [name], [name] Entered By: [name] on 01/29/2015 09:40:19 Witz, [name] ([id number]) -------------------------------------------------------------------------------- Physician Orders Details [name]. Date of Service: 01/29/2015 8:45 AM Medical Record Number: [id number] Patient Account Number: [id number] Date of Birth/Sex: 02-06-1956 (59 y.o. Female) Treating RN: Montey Hora Primary Care Physician: Steele Sizer Other Clinician: Referring Physician: Steele Sizer Treating Physician/Extender: Frann Rider in Treatment: 5 Verbal / Phone Orders: Yes Clinician: Montey Hora Read Back and Verified: Yes Diagnosis Coding Wound Cleansing Wound #1 Right Back o May shower with protection. Skin Barriers/Peri-Wound Care Wound #1 Right Back o Skin Prep Primary Wound Dressing Wound #1 Right Back o Contact layer o Prisma Ag Secondary Dressing Wound #1 Right Back o Dry Gauze o Boardered Foam Dressing Dressing Change Frequency Wound #1 Right  Back o Change dressing every week - to be changed at wound care center next visit - only change at facility if dressing becomes soiled or comes off Follow-up Appointments Wound #1 Right Back o Return Appointment in 1 week. Electronic Signature(s) Signed: 01/29/2015 12:22:30 PM By: [name], [name] Signed: 01/29/2015 5:45:58 PM By: Montey Hora Entered By: Montey Hora on 01/29/2015 09:34:15 Guterrez, [name] ([id number]) -------------------------------------------------------------------------------- Problem List Details [name], [name]. Date of Service: 01/29/2015 8:45 AM Medical Record Number: [id number] Patient Account Number: [id number] Date of Birth/Sex: [birth year] (59 y.o. Female) Treating RN: Primary Care Physician: Steele Sizer Other Clinician: Referring Physician: Steele Sizer Treating Physician/Extender: Frann Rider in Treatment: 5 Active Problems ICD-10 Encounter Code Description Active Date Diagnosis E11.622 Type 2 diabetes mellitus with other skin ulcer 12/23/2014 Yes L89.112 Pressure ulcer of right upper back, stage 2 12/23/2014 Yes S80.821A Blister (nonthermal), right lower leg, initial encounter 12/23/2014 Yes N18.6 End stage renal disease 12/23/2014 Yes Inactive Problems Resolved Problems Electronic Signature(s) Signed: 01/29/2015 9:39:42 AM By: [name], [name] Entered By: [name] on 01/29/2015 09:39:42 Riggin, [name] ([id number]) -------------------------------------------------------------------------------- Progress Note Details [name], [name] Date of Service: 01/29/2015 8:45 AM Medical Record Number: [id number] Patient Account Number: [id number] Date of Birth/Sex: [birth year] (59 y.o. Female) Treating RN: Primary Care Physician: Steele Sizer Other Clinician: Referring Physician: Steele Sizer Treating Physician/Extender: Frann Rider in Treatment: 5 Subjective Chief  Complaint Information obtained from Patient Patient presents to the wound care center for a consult due non healing wound. She has an open wound on her right upper back which she's had for about a year and she recently noticed a blister on her right lower extremity about 2 weeks ago. History of Present Illness (HPI) The following HPI elements were documented for the patient's wound: Location: right upper back and right lower extremity wounds Quality: Patient reports No Pain. Severity: Patient states wound (s) are getting better. Duration: Patient has had the wound for > 3 months prior to seeking treatment at the wound center Timing: she thought it first occurred when she was using a heating pad about a year ago after back surgery. Context: The wound appeared gradually over time Modifying Factors: Patient is currently on renal dialysis and receives treatments  3 times weekly Associated Signs and Symptoms: Patient reports having: surgery scheduled for this week for a AV fistula left arm. 59 year old patient who is known to be a diabetic and has end-stage renal disease has had several comorbidities including coronary artery disease, hypertension, hyperlipidemia, pancreatitis, anemia, previous history of hysterectomy, cholecystectomy, left-sided salivary gland excision, bilateral cataract surgery,Peritoneal dialysis catheter, hemodialysis catheter. the area on the back has also been caused by instant pressure she used to sleep on a recliner all day and has significant kyphoscoliosis. As far as the wound on her right lower extremity she's not sure how this blister occurred but she thought it has been there for about 2 weeks. No recent blood investigations available and no recent hemoglobin A1c. 12/30/2014 -- she is an assisted living facility but I believe the nurses that have not followed instructions as she had some cream applied on her back and there was a different dressing. Last week she's  had a AV fistula placed on her left forearm. 01/13/2015 -- she has had some localized infection at the port site and she's been on doxycycline for this. [name] ([id number]) Objective Constitutional Pulse regular. Respirations normal and unlabored. Afebrile. Vitals Time Taken: 9:20 AM, Height: 65 in, Weight: 156 lbs, BMI: 26, Temperature: 98.4 F, Pulse: 82 bpm, Respiratory Rate: 16 breaths/min, Blood Pressure: 188/78 mmHg. Eyes Nonicteric. Reactive to light. Ears, Nose, Mouth, and Throat Lips, teeth, and gums WNL.Marland Kitchen Moist mucosa without lesions . Neck supple and nontender. No palpable supraclavicular or cervical adenopathy. Normal sized without goiter. Respiratory WNL. No retractions.. Cardiovascular Pedal Pulses WNL. No clubbing, cyanosis or edema. Chest Breasts symmetical and no nipple discharge.. Breast tissue WNL, no masses, lumps, or tenderness.. Musculoskeletal Adexa without tenderness or enlargement.. Digits and nails w/o clubbing, cyanosis, infection, petechiae, ischemia, or inflammatory conditions.Marland Kitchen Psychiatric Judgement and insight Intact.. No evidence of depression, anxiety, or agitation.. General Notes: the wound is small and is looking excellent with good granulation tissue. Integumentary (Hair, Skin) No suspicious lesions. No crepitus or fluctuance. No peri-wound warmth or erythema. No masses.. Wound #1 status is Open. Original cause of wound was Blister. The wound is located on the Right Back. The wound measures 5.3cm length x 4.4cm width x 0.1cm depth; 18.315cm^2 area and 1.832cm^3 volume. The wound is limited to skin breakdown. There is no tunneling or undermining noted. There is a medium amount of serosanguineous drainage noted. The wound margin is distinct with the outline attached to the wound base. There is large (67-100%) pink granulation within the wound bed. There is no necrotic tissue within the wound bed. The periwound skin appearance exhibited:  Moist. The periwound skin appearance did not exhibit: Callus, Crepitus, Excoriation, Fluctuance, Friable, Induration, Localized Edema, Rash, Scarring, Dry/Scaly, Maceration, Atrophie Blanche, Cyanosis, Ecchymosis, Hemosiderin Staining, Mottled, Dominik, Lajuan S. ([id number]) Pallor, Rubor, Erythema. Periwound temperature was noted as No Abnormality. Assessment Active Problems ICD-10 E11.622 - Type 2 diabetes mellitus with other skin ulcer L89.112 - Pressure ulcer of right upper back, stage 2 S80.821A - Blister (nonthermal), right lower leg, initial encounter N18.6 - End stage renal disease I have recommended a piece of Prisma over the wound a non-adherent dressing like Xeroform gauze and then an ABD pad and offloading completely for a week. If the dressing has not soaked she does not need to change it and she comes to see me next week. Plan Wound Cleansing: Wound #1 Right Back: May shower with protection. Skin Barriers/Peri-Wound Care: Wound #1 Right Back: Skin Prep Primary Wound  Dressing: Wound #1 Right Back: Contact layer Prisma Ag Secondary Dressing: Wound #1 Right Back: Dry Gauze Boardered Foam Dressing Dressing Change Frequency: Wound #1 Right Back: Change dressing every week - to be changed at wound care center next visit - only change at facility if dressing becomes soiled or comes off Follow-up Appointments: Wound #1 Right Back: Return Appointment in 1 week. [name], [name] ([id number]) I have recommended a piece of Prisma over the wound a non-adherent dressing like Xeroform gauze and then an ABD pad and offloading completely for a week. If the dressing has not soaked she does not need to change it and she comes to see me next week. Electronic Signature(s) Signed: 01/29/2015 9:41:38 AM By: [name], [name] Entered By: [name] on 01/29/2015 09:41:38 [name], [name]  ([id number]) -------------------------------------------------------------------------------- SuperBill Details [name]. Date of Service: 01/29/2015 Medical Record Number: [id number] Patient Account Number: [id number] Date of Birth/Sex: [birth year] (59 y.o. Female) Treating RN: Primary Care Physician: Steele Sizer Other Clinician: Referring Physician: Steele Sizer Treating Physician/Extender: Frann Rider in Treatment: 5 Diagnosis Coding ICD-10 Codes Code Description [phone] Type 2 diabetes mellitus with other skin ulcer L89.112 Pressure ulcer of right upper back, stage 2 S80.821A Blister (nonthermal), right lower leg, initial encounter N18.6 End stage renal disease Physician Procedures CPT4 Code: [id number] Description: 61683 - WC PHYS LEVEL 3 - EST PT ICD-10 Description Diagnosis E11.622 Type 2 diabetes mellitus with other skin ulcer L89.112 Pressure ulcer of right upper back, stage 2 Modifier: Quantity: 1 Electronic Signature(s) Signed: 01/29/2015 9:42:05 AM By: [name], [name] Entered By: [name] on 01/29/2015 09:42:05

## 2015-02-05 ENCOUNTER — Encounter: Payer: Medicare Other | Admitting: Surgery

## 2015-02-05 DIAGNOSIS — L89112 Pressure ulcer of right upper back, stage 2: Secondary | ICD-10-CM | POA: Diagnosis not present

## 2015-02-05 NOTE — Progress Notes (Signed)
AMIL, MOSEMAN ([id number]) Visit Report for 02/05/2015 Chief Complaint Document Details [name], [name] 02/05/2015 11:00 Date of Service: AM Medical Record [id number] Number: Patient Account Number: [id number] 1955/11/11 (59 y.o. Treating RN: Date of Birth/Sex: Female) Other Clinician: Primary Care Physician: Steele Sizer Treating [name] Referring Physician: Steele Sizer Physician/Extender: Suella Grove in Treatment: 6 Information Obtained from: Patient Chief Complaint Patient presents to the wound care center for a consult due non healing wound. She has an open wound on her right upper back which she's had for about a year and she recently noticed a blister on her right lower extremity about 2 weeks ago. Electronic Signature(s) Signed: 02/05/2015 11:08:09 AM By: [name], [name] Entered By: [name] on 02/05/2015 11:08:09 Zeisler, [name] ([id number]) -------------------------------------------------------------------------------- HPI Details [name], [name] 02/05/2015 11:00 Date of Service: AM Medical Record [id number] Number: Patient Account Number: [id number] Jun 29, 1956 (59 y.o. Treating RN: Date of Birth/Sex: Female) Other Clinician: Primary Care Physician: Steele Sizer Treating [name] Referring Physician: Steele Sizer Physician/Extender: [name]: 6 History of Present Illness Location: right upper back and right lower extremity wounds Quality: Patient reports No Pain. Severity: Patient states wound (s) are getting better. Duration: Patient has had the wound for > 3 months prior to seeking treatment at the wound center Timing: she thought it first occurred when she was using a heating pad about a year ago after back surgery. Context: The wound appeared gradually over time Modifying Factors: Patient is currently on renal dialysis and receives treatments 3 times weekly Associated Signs and Symptoms:  Patient reports having: surgery scheduled for this week for a AV fistula left arm. HPI Description: 59 year old patient who is known to be a diabetic and has end-stage renal disease has had several comorbidities including coronary artery disease, hypertension, hyperlipidemia, pancreatitis, anemia, previous history of hysterectomy, cholecystectomy, left-sided salivary gland excision, bilateral cataract surgery,Peritoneal dialysis catheter, hemodialysis catheter. the area on the back has also been caused by instant pressure she used to sleep on a recliner all day and has significant kyphoscoliosis. As far as the wound on her right lower extremity she's not sure how this blister occurred but she thought it has been there for about 2 weeks. No recent blood investigations available and no recent hemoglobin A1c. 12/30/2014 -- she is an assisted living facility but I believe the nurses that have not followed instructions as she had some cream applied on her back and there was a different dressing. Last week she's had a AV fistula placed on her left forearm. 01/13/2015 -- she has had some localized infection at the port site and she's been on doxycycline for this. 02/05/2015 - he has developed a small blister on her right lower extremity. Electronic Signature(s) Signed: 02/05/2015 11:08:30 AM By: [name], [name] Entered By: [name] on 02/05/2015 11:08:30 Donoso, [name] ([id number]) -------------------------------------------------------------------------------- Physical Exam Details [name], [name] 02/05/2015 11:00 Date of Service: AM Medical Record [id number] Number: Patient Account Number: [id number] Jul 23, 1955 (59 y.o. Treating RN: Date of Birth/Sex: Female) Other Clinician: Primary Care Physician: Steele Sizer Treating [name] Referring Physician: Steele Sizer Physician/Extender: [name]: 6 Constitutional . Pulse regular. Respirations  normal and unlabored. Afebrile. . Eyes Nonicteric. Reactive to light. Ears, Nose, Mouth, and Throat Lips, teeth, and gums WNL.Marland Kitchen Moist mucosa without lesions . Neck supple and nontender. No palpable supraclavicular or cervical adenopathy. Normal sized without goiter. Respiratory WNL. No retractions.. Cardiovascular Pedal Pulses WNL. No clubbing, cyanosis  or edema. Musculoskeletal Adexa without tenderness or enlargement.. Digits and nails w/o clubbing, cyanosis, infection, petechiae, ischemia, or inflammatory conditions.. Integumentary (Hair, Skin) No suspicious lesions. No crepitus or fluctuance. No peri-wound warmth or erythema. No masses.Marland Kitchen Psychiatric Judgement and insight Intact.. No evidence of depression, anxiety, or agitation.. Notes the wound on her back is looking clean and does not have any surrounding cellulitis. She is currently blister on the right lower extremity which is fluid-filled but does not have any cellulitis. Electronic Signature(s) Signed: 02/05/2015 11:09:09 AM By: [name], [name] Entered By: [name] on 02/05/2015 11:09:08 Dexter, [name] ([id number]) -------------------------------------------------------------------------------- Physician Orders Details [name], [name] 02/05/2015 11:00 Date of Service: AM Medical Record [id number] Number: Patient Account Number: [id number] Nov 30, 1955 (59 y.o. Treating RN: Montey Hora Date of Birth/Sex: Female) Other Clinician: Primary Care Physician: Steele Sizer Treating [name] Referring Physician: Steele Sizer Physician/Extender: Suella Grove in Treatment: 6 Verbal / Phone Orders: Yes Clinician: Montey Hora Read Back and Verified: Yes Diagnosis Coding Wound Cleansing Wound #1 Right Back o May shower with protection. Wound #3 Right,Midline Lower Leg o May shower with protection. Skin Barriers/Peri-Wound Care Wound #1 Right Back o Skin Prep Wound #3 Right,Midline  Lower Leg o Skin Prep Primary Wound Dressing Wound #1 Right Back o Contact layer o Aquacel Ag Wound #3 Right,Midline Lower Leg o Contact layer o Aquacel Ag Secondary Dressing Wound #1 Right Back o Dry Gauze o Boardered Foam Dressing Wound #3 Right,Midline Lower Leg o Dry Gauze o Boardered Foam Dressing Dressing Change Frequency Wound #1 Right Back [name], [name] ([id number]) o Change dressing every week - to be changed at wound care center next visit - only change at facility if dressing becomes soiled or comes off Wound #3 Right,Midline Lower Leg o Change dressing every week - to be changed at wound care center next visit - only change at facility if dressing becomes soiled or comes off Follow-up Appointments Wound #1 Right Back o Return Appointment in 1 week. Electronic Signature(s) Signed: 02/05/2015 11:59:42 AM By: [name], [name] Signed: 02/05/2015 4:33:06 PM By: Montey Hora Entered By: Montey Hora on 02/05/2015 10:56:07 Verret, [name] ([id number]) -------------------------------------------------------------------------------- Problem List Details [name], [name] 02/05/2015 11:00 Date of Service: AM Medical Record [id number] Number: Patient Account Number: [id number] 16-Nov-1955 (59 y.o. Treating RN: Date of Birth/Sex: Female) Other Clinician: Primary Care Physician: Steele Sizer Treating [name] Referring Physician: Steele Sizer Physician/Extender: Suella Grove in Treatment: 6 Active Problems ICD-10 Encounter Code Description Active Date Diagnosis E11.622 Type 2 diabetes mellitus with other skin ulcer 12/23/2014 Yes L89.112 Pressure ulcer of right upper back, stage 2 12/23/2014 Yes S80.821A Blister (nonthermal), right lower leg, initial encounter 12/23/2014 Yes N18.6 End stage renal disease 12/23/2014 Yes Inactive Problems Resolved Problems Electronic Signature(s) Signed: 02/05/2015 11:09:36 AM By: [name], [name] Previous Signature: 02/05/2015 11:08:01 AM Version By: [name], [name] Entered By: [name] on 02/05/2015 11:09:36 Deisher, [name] ([id number]) -------------------------------------------------------------------------------- Progress Note Details [name], POMPLUN. 02/05/2015 11:00 Date of Service: AM Medical Record [id number] Number: Patient Account Number: [id number] 06/28/1956 (59 y.o. Treating RN: Date of Birth/Sex: Female) Other Clinician: Primary Care Physician: Steele Sizer Treating [name] Referring Physician: Steele Sizer Physician/Extender: Suella Grove in Treatment: 6 Subjective Chief Complaint Information obtained from Patient Patient presents to the wound care center for a consult due non healing wound. She has an open wound on her right upper back which she's had for about a year and  she recently noticed a blister on her right lower extremity about 2 weeks ago. History of Present Illness (HPI) The following HPI elements were documented for the patient's wound: Location: right upper back and right lower extremity wounds Quality: Patient reports No Pain. Severity: Patient states wound (s) are getting better. Duration: Patient has had the wound for > 3 months prior to seeking treatment at the wound center Timing: she thought it first occurred when she was using a heating pad about a year ago after back surgery. Context: The wound appeared gradually over time Modifying Factors: Patient is currently on renal dialysis and receives treatments 3 times weekly Associated Signs and Symptoms: Patient reports having: surgery scheduled for this week for a AV fistula left arm. 59 year old patient who is known to be a diabetic and has end-stage renal disease has had several comorbidities including coronary artery disease, hypertension, hyperlipidemia, pancreatitis, anemia, previous history of hysterectomy, cholecystectomy, left-sided  salivary gland excision, bilateral cataract surgery,Peritoneal dialysis catheter, hemodialysis catheter. the area on the back has also been caused by instant pressure she used to sleep on a recliner all day and has significant kyphoscoliosis. As far as the wound on her right lower extremity she's not sure how this blister occurred but she thought it has been there for about 2 weeks. No recent blood investigations available and no recent hemoglobin A1c. 12/30/2014 -- she is an assisted living facility but I believe the nurses that have not followed instructions as she had some cream applied on her back and there was a different dressing. Last week she's had a AV fistula placed on her left forearm. 01/13/2015 -- she has had some localized infection at the port site and she's been on doxycycline for this. 02/05/2015 - he has developed a small blister on her right lower extremity. [name] ([id number]) Objective Constitutional Pulse regular. Respirations normal and unlabored. Afebrile. Vitals Time Taken: 10:39 AM, Height: 65 in, Weight: 156 lbs, BMI: 26, Temperature: 98.3 F, Pulse: 83 bpm, Respiratory Rate: 16 breaths/min, Blood Pressure: 131/68 mmHg. Eyes Nonicteric. Reactive to light. Ears, Nose, Mouth, and Throat Lips, teeth, and gums WNL.Marland Kitchen Moist mucosa without lesions . Neck supple and nontender. No palpable supraclavicular or cervical adenopathy. Normal sized without goiter. Respiratory WNL. No retractions.. Cardiovascular Pedal Pulses WNL. No clubbing, cyanosis or edema. Musculoskeletal Adexa without tenderness or enlargement.. Digits and nails w/o clubbing, cyanosis, infection, petechiae, ischemia, or inflammatory conditions.Marland Kitchen Psychiatric Judgement and insight Intact.. No evidence of depression, anxiety, or agitation.. General Notes: the wound on her back is looking clean and does not have any surrounding cellulitis. She is currently blister on the right lower extremity  which is fluid-filled but does not have any cellulitis. Integumentary (Hair, Skin) No suspicious lesions. No crepitus or fluctuance. No peri-wound warmth or erythema. No masses.. Wound #1 status is Open. Original cause of wound was Blister. The wound is located on the Right Back. The wound measures 3.2cm length x 6.5cm width x 0.1cm depth; 16.336cm^2 area and 1.634cm^3 volume. The wound is limited to skin breakdown. There is no tunneling or undermining noted. There is a medium amount of serosanguineous drainage noted. The wound margin is distinct with the outline attached to the wound base. There is large (67-100%) red, pink, friable granulation within the wound bed. There is no necrotic tissue within the wound bed. The periwound skin appearance exhibited: Moist. The periwound skin appearance did not exhibit: Callus, Crepitus, Excoriation, Fluctuance, Friable, Induration, Localized Edema, Rash, Scarring, Dry/Scaly, Maceration, Atrophie Blanche,  Cyanosis, Ecchymosis, Hemosiderin Staining, Mottled, Pallor, Rubor, Erythema. Periwound temperature was noted as No Abnormality. [name] ([id number]) Wound #3 status is Open. Original cause of wound was Blister. The wound is located on the Right,Midline Lower Leg. The wound measures 5cm length x 2cm width x 0.1cm depth; 7.854cm^2 area and 0.785cm^3 volume. The wound is limited to skin breakdown. The wound margin is indistinct and nonvisible. The periwound skin appearance did not exhibit: Callus, Crepitus, Excoriation, Fluctuance, Friable, Induration, Localized Edema, Rash, Scarring, Dry/Scaly, Maceration, Moist, Atrophie Blanche, Cyanosis, Ecchymosis, Hemosiderin Staining, Mottled, Pallor, Rubor, Erythema. General Notes: intact blister Assessment Active Problems ICD-10 E11.622 - Type 2 diabetes mellitus with other skin ulcer L89.112 - Pressure ulcer of right upper back, stage 2 S80.821A - Blister (nonthermal), right lower leg, initial  encounter N18.6 - End stage renal disease Have recommended silver alginate on her back and a nonstick dressing and foam bandage to be left for a week. We will do the same on her right lower extremity and continue to monitor her. She will come back and see as next week. Plan Wound Cleansing: Wound #1 Right Back: May shower with protection. Wound #3 Right,Midline Lower Leg: May shower with protection. Skin Barriers/Peri-Wound Care: Wound #1 Right Back: Skin Prep Wound #3 Right,Midline Lower Leg: Skin Prep Primary Wound Dressing: Wound #1 Right Back: Contact layer Aquacel Ag Wound #3 Right,Midline Lower Leg: Contact layer [name], [name] ([id number]) Aquacel Ag Secondary Dressing: Wound #1 Right Back: Dry Gauze Boardered Foam Dressing Wound #3 Right,Midline Lower Leg: Dry Gauze Boardered Foam Dressing Dressing Change Frequency: Wound #1 Right Back: Change dressing every week - to be changed at wound care center next visit - only change at facility if dressing becomes soiled or comes off Wound #3 Right,Midline Lower Leg: Change dressing every week - to be changed at wound care center next visit - only change at facility if dressing becomes soiled or comes off Follow-up Appointments: Wound #1 Right Back: Return Appointment in 1 week. Have recommended silver alginate on her back and a nonstick dressing and foam bandage to be left for a week. We will do the same on her right lower extremity and continue to monitor her. She will come back and see as next week. Electronic Signature(s) Signed: 02/05/2015 11:10:18 AM By: [name], [name] Entered By: [name] on 02/05/2015 11:10:17 Trinka, [name] ([id number]) -------------------------------------------------------------------------------- SuperBill Details [name]. Date of Service: 02/05/2015 Medical Record Number: [id number] Patient Account Number: [id number] Date of Birth/Sex: [birth year] (59 y.o.  Female) Treating RN: Primary Care Physician: Steele Sizer Other Clinician: Referring Physician: Steele Sizer Treating Physician/Extender: Frann Rider in Treatment: 6 Diagnosis Coding ICD-10 Codes Code Description E11.622 Type 2 diabetes mellitus with other skin ulcer L89.112 Pressure ulcer of right upper back, stage 2 S80.821A Blister (nonthermal), right lower leg, initial encounter N18.6 End stage renal disease Facility Procedures CPT4 Code: [id number] Description: 99213 - WOUND CARE VISIT-LEV 3 EST PT Modifier: Quantity: 1 Physician Procedures CPT4 Code: [id number] Description: 92330 - WC PHYS LEVEL 3 - EST PT ICD-10 Description Diagnosis E11.622 Type 2 diabetes mellitus with other skin ulcer L89.112 Pressure ulcer of right upper back, stage 2 S80.821A Blister (nonthermal), right lower leg, initial enc N18.6 End  stage renal disease Modifier: ounter Quantity: 1 Electronic Signature(s) Signed: 02/05/2015 11:10:36 AM By: [name], [name] Entered By: [name] on 02/05/2015 11:10:36

## 2015-02-06 NOTE — Progress Notes (Signed)
[name], [name] ([id number]) Visit Report for 02/05/2015 Arrival Information Details [name], [name]. Date of Service: 02/05/2015 11:00 AM Medical Record Number: [id number] Patient Account Number: [id number] Date of Birth/Sex: 23-Sep-1955 (59 y.o. Female) Treating RN: Cornell Barman Primary Care Physician: Steele Sizer Other Clinician: Referring Physician: Steele Sizer Treating Physician/Extender: Frann Rider in Treatment: 6 Visit Information History Since Last Visit Added or deleted any medications: No Patient Arrived: [name] Any new allergies or adverse reactions: No Arrival Time: 10:38 Had a fall or experienced change in No Accompanied By: self activities of daily living that may affect Transfer Assistance: None risk of falls: Patient Identification Verified: Yes Hospitalized since last visit: No Secondary Verification Process Yes Has Dressing in Place as Prescribed: Yes Completed: Pain Present Now: No Patient Requires Transmission- No Based Precautions: Patient Has Alerts: Yes Patient Alerts: Patient on Blood Thinner ABI R: 1.28 Electronic Signature(s) Signed: 02/05/2015 4:42:55 PM By: Gretta Cool, RN, [name], Kim RN, [name] Entered By: Gretta Cool, RN, [name], Kim on 02/05/2015 10:39:39 [name], [name] ([id number]) -------------------------------------------------------------------------------- Clinic Level of Care Assessment Details [name]. Date of Service: 02/05/2015 11:00 AM Medical Record Number: [id number] Patient Account Number: [id number] Date of Birth/Sex: September 25, 1955 (59 y.o. Female) Treating RN: Montey Hora Primary Care Physician: Steele Sizer Other Clinician: Referring Physician: Steele Sizer Treating Physician/Extender: Frann Rider in Treatment: 6 Clinic Level of Care Assessment Items TOOL 4 Quantity Score []  - Use when only an EandM is performed on FOLLOW-UP visit 0 ASSESSMENTS - Nursing Assessment / Reassessment X  - Reassessment of Co-morbidities (includes updates in patient status) 1 10 X - Reassessment of Adherence to Treatment Plan 1 5 ASSESSMENTS - Wound and Skin Assessment / Reassessment []  - Simple Wound Assessment / Reassessment - one wound 0 X - Complex Wound Assessment / Reassessment - multiple wounds 2 5 []  - Dermatologic / Skin Assessment (not related to wound area) 0 ASSESSMENTS - Focused Assessment []  - Circumferential Edema Measurements - multi extremities 0 []  - Nutritional Assessment / Counseling / Intervention 0 X - Lower Extremity Assessment (monofilament, tuning fork, pulses) 1 5 []  - Peripheral Arterial Disease Assessment (using hand held doppler) 0 ASSESSMENTS - Ostomy and/or Continence Assessment and Care []  - Incontinence Assessment and Management 0 []  - Ostomy Care Assessment and Management (repouching, etc.) 0 PROCESS - Coordination of Care X - Simple Patient / Family Education for ongoing care 1 15 []  - Complex (extensive) Patient / Family Education for ongoing care 0 []  - Staff obtains Programmer, systems, Records, Test Results / Process Orders 0 []  - Staff telephones HHA, Nursing Homes / Clarify orders / etc 0 []  - Routine Transfer to another Facility (non-emergent condition) 0 [name], [name] ([id number]) []  - Routine Hospital Admission (non-emergent condition) 0 []  - New Admissions / Biomedical engineer / Ordering NPWT, Apligraf, etc. 0 []  - Emergency Hospital Admission (emergent condition) 0 X - Simple Discharge Coordination 1 10 []  - Complex (extensive) Discharge Coordination 0 PROCESS - Special Needs []  - Pediatric / Minor Patient Management 0 []  - Isolation Patient Management 0 []  - Hearing / Language / Visual special needs 0 []  - Assessment of Community assistance (transportation, D/C planning, etc.) 0 []  - Additional assistance / Altered mentation 0 []  - Support Surface(s) Assessment (bed, cushion, seat, etc.) 0 INTERVENTIONS - Wound Cleansing / Measurement []  -  Simple Wound Cleansing - one wound 0 X - Complex Wound Cleansing - multiple wounds 2 5 X - Wound Imaging (photographs - any  number of wounds) 1 5 []  - Wound Tracing (instead of photographs) 0 []  - Simple Wound Measurement - one wound 0 X - Complex Wound Measurement - multiple wounds 2 5 INTERVENTIONS - Wound Dressings []  - Small Wound Dressing one or multiple wounds 0 X - Medium Wound Dressing one or multiple wounds 2 15 []  - Large Wound Dressing one or multiple wounds 0 []  - Application of Medications - topical 0 []  - Application of Medications - injection 0 INTERVENTIONS - Miscellaneous []  - External ear exam 0 [name], [name] ([id number]) []  - Specimen Collection (cultures, biopsies, blood, body fluids, etc.) 0 []  - Specimen(s) / Culture(s) sent or taken to Lab for analysis 0 []  - Patient Transfer (multiple staff / Harrel Lemon Lift / Similar devices) 0 []  - Simple Staple / Suture removal (25 or less) 0 []  - Complex Staple / Suture removal (26 or more) 0 []  - Hypo / Hyperglycemic Management (close monitor of Blood Glucose) 0 []  - Ankle / Brachial Index (ABI) - do not check if billed separately 0 X - Vital Signs 1 5 Has the patient been seen at the hospital within the last three years: Yes Total Score: 115 Level Of Care: New/Established - Level 3 Electronic Signature(s) Signed: 02/05/2015 4:33:06 PM By: Montey Hora Entered By: Montey Hora on 02/05/2015 10:57:07 [name], [name] ([id number]) -------------------------------------------------------------------------------- Encounter Discharge Information Details [name]. Date of Service: 02/05/2015 11:00 AM Medical Record Number: [id number] Patient Account Number: [id number] Date of Birth/Sex: 02-May-1956 (59 y.o. Female) Treating RN: Cornell Barman Primary Care Physician: Steele Sizer Other Clinician: Referring Physician: Steele Sizer Treating Physician/Extender: Frann Rider in Treatment: 6 Encounter  Discharge Information Items Discharge Pain Level: 0 Discharge Condition: Stable Ambulatory Status: Walker Discharge Destination: Home Private Transportation: Auto Accompanied By: self Schedule Follow-up Appointment: Yes Medication Reconciliation completed and Yes provided to Patient/Care Samyrah Bruster: Clinical Summary of Care: Electronic Signature(s) Signed: 02/05/2015 4:42:55 PM By: Gretta Cool, RN, [name], Kim RN, [name] Entered By: Gretta Cool, RN, [name], Kim on 02/05/2015 11:04:06 Joy, [name] ([id number]) -------------------------------------------------------------------------------- Lower Extremity Assessment Details Patient Name: MATSUE, STROM. Date of Service: 02/05/2015 11:00 AM Medical Record Number: [id number] Patient Account Number: [id number] Date of Birth/Sex: [birth year] (59 y.o. Female) Treating RN: Cornell Barman Primary Care Physician: Steele Sizer Other Clinician: Referring Physician: Steele Sizer Treating Physician/Extender: Frann Rider in Treatment: 6 Edema Assessment Assessed: Shirlyn Goltz: No] [Right: No] E[Left: dema] [Right: :] Calf Left: Right: Point of Measurement: 30 cm From Medial Instep cm 30.9 cm Ankle Left: Right: Point of Measurement: 11 cm From Medial Instep cm 19.5 cm Vascular Assessment Pulses: Posterior Tibial Palpable: [Right:Yes] Dorsalis Pedis Palpable: [Right:Yes] Extremity colors, hair growth, and conditions: Extremity Color: [Right:Normal] Hair Growth on Extremity: [Right:No] Temperature of Extremity: [Right:Warm] Capillary Refill: [Right:< 3 seconds] Toe Nail Assessment Left: Right: Thick: No Discolored: Yes Deformed: No Improper Length and Hygiene: No Electronic Signature(s) Signed: 02/05/2015 4:42:55 PM By: Gretta Cool, RN, [name], Kim RN, [name] Entered By: Gretta Cool, RN, [name], Kim on 02/05/2015 10:42:23 [name], [name] ([id number]) [name], [name] ([id number]) -------------------------------------------------------------------------------- Multi  Wound Chart Details [name], [name] Date of Service: 02/05/2015 11:00 AM Medical Record Number: [id number] Patient Account Number: [id number] Date of Birth/Sex: 14-May-1956 (59 y.o. Female) Treating RN: Montey Hora Primary Care Physician: Steele Sizer Other Clinician: Referring Physician: Steele Sizer Treating Physician/Extender: Frann Rider in Treatment: 6 Vital Signs Height(in): 65 Pulse(bpm): 83 Weight(lbs): 156 Blood Pressure 131/68 (mmHg): Body Mass Index(BMI): 26 Temperature(F): 98.3 Respiratory  Rate 16 (breaths/min): Photos: [1:No Photos] [3:No Photos] [N/A:N/A] Wound Location: [1:Right Back] [3:Right Lower Leg - Midline N/A] Wounding Event: [1:Blister] [3:Blister] [N/A:N/A] Primary Etiology: [1:Pressure Ulcer] [3:Venous Leg Ulcer] [N/A:N/A] Comorbid History: [1:Glaucoma, Anemia, Coronary Artery Disease, Coronary Artery Disease, Hypertension, Type II Diabetes, End Stage Renal Disease, Osteoarthritis, Neuropathy Osteoarthritis, Neuropathy] [3:Glaucoma, Anemia, Hypertension, Type II Diabetes,  End Stage Renal Disease,] [N/A:N/A] Date Acquired: [1:10/19/2013] [3:01/22/2015] [N/A:N/A] Weeks of Treatment: [1:6] [3:0] [N/A:N/A] Wound Status: [1:Open] [3:Open] [N/A:N/A] Measurements L x W x D 3.2x6.5x0.1 [3:5x2x0.1] [N/A:N/A] (cm) Area (cm) : [1:16.336] [3:7.854] [N/A:N/A] Volume (cm) : [1:1.634] [3:0.785] [N/A:N/A] % Reduction in Area: [1:57.60%] [3:0.00%] [N/A:N/A] % Reduction in Volume: 57.50% [3:0.00%] [N/A:N/A] Classification: [1:Category/Stage II] [3:Unclassifiable] [N/A:N/A] HBO Classification: [1:N/A] [3:Grade 0] [N/A:N/A] Exudate Amount: [1:Medium] [3:N/A] [N/A:N/A] Exudate Type: [1:Serosanguineous] [3:N/A] [N/A:N/A] Exudate Color: [1:red, brown] [3:N/A] [N/A:N/A] Wound Margin: [1:Distinct, outline attached Indistinct, nonvisible] [N/A:N/A] Granulation Amount: [1:Large (67-100%)] [3:N/A] [N/A:N/A] Granulation Quality: [1:Red, Pink,  Friable] [3:N/A] [N/A:N/A] Necrotic Amount: [1:None Present (0%)] [3:N/A] [N/A:N/A] Exposed Structures: [1:Fascia: No Fat: No] [3:Fascia: No Fat: No] [N/A:N/A] Tendon: No Tendon: No Muscle: No Muscle: No Joint: No Joint: No Bone: No Bone: No Limited to Skin Limited to Skin Breakdown Breakdown Epithelialization: Medium (34-66%) N/A N/A Periwound Skin Texture: Edema: No Edema: No N/A Excoriation: No Excoriation: No Induration: No Induration: No Callus: No Callus: No Crepitus: No Crepitus: No Fluctuance: No Fluctuance: No Friable: No Friable: No Rash: No Rash: No Scarring: No Scarring: No Periwound Skin Moist: Yes Maceration: No N/A Moisture: Maceration: No Moist: No Dry/Scaly: No Dry/Scaly: No Periwound Skin Color: Atrophie Blanche: No Atrophie Blanche: No N/A Cyanosis: No Cyanosis: No Ecchymosis: No Ecchymosis: No Erythema: No Erythema: No Hemosiderin Staining: No Hemosiderin Staining: No Mottled: No Mottled: No Pallor: No Pallor: No Rubor: No Rubor: No Temperature: No Abnormality N/A N/A Tenderness on No No N/A Palpation: Wound Preparation: Ulcer Cleansing: Topical Anesthetic N/A Rinsed/Irrigated with Applied: Other: Saline lidocaine4% Topical Anesthetic Applied: None Assessment Notes: N/A intact blister N/A Treatment Notes Electronic Signature(s) Signed: 02/05/2015 4:33:06 PM By: Montey Hora Entered By: Montey Hora on 02/05/2015 10:54:42 [name], [name] ([id number]) -------------------------------------------------------------------------------- [place] Details Patient Name: OMUNIQUE, PEDERSON. Date of Service: 02/05/2015 11:00 AM Medical Record Number: [id number] Patient Account Number: [id number] Date of Birth/Sex: September 27, 1955 (59 y.o. Female) Treating RN: Cornell Barman Primary Care Physician: Steele Sizer Other Clinician: Referring Physician: Steele Sizer Treating Physician/Extender: Frann Rider in  Treatment: 6 Active Inactive Abuse / Safety / Falls / Self Care Management Nursing Diagnoses: Potential for falls Goals: Patient will remain injury free Date Initiated: 12/23/2014 Goal Status: Active Patient/caregiver will verbalize understanding of skin care regimen Date Initiated: 12/23/2014 Goal Status: Active Patient/caregiver will verbalize/demonstrate measures taken to prevent injury and/or falls Date Initiated: 12/23/2014 Goal Status: Active Patient/caregiver will verbalize/demonstrate understanding of what to do in case of emergency Date Initiated: 12/23/2014 Goal Status: Active Interventions: Assess fall risk on admission and as needed Assess: immobility, friction, shearing, incontinence upon admission and as needed Assess impairment of mobility on admission and as needed per policy Assess self care needs on admission and as needed Provide education on fall prevention Notes: Wound/Skin Impairment Nursing Diagnoses: Impaired tissue integrity Knowledge deficit related to ulceration/compromised skin integrity Goals: Patient/caregiver will verbalize understanding of skin care regimen SABRIN, DUNLEVY ([id number]) Date Initiated: 12/23/2014 Goal Status: Active Ulcer/skin breakdown will heal within 14 weeks Date Initiated: 12/23/2014 Goal Status: Active Interventions: Assess patient/caregiver ability to obtain necessary supplies Assess patient/caregiver ability to  perform ulcer/skin care regimen upon admission and as needed Assess ulceration(s) every visit Provide education on ulcer and skin care Treatment Activities: Skin care regimen initiated : 02/05/2015 Topical wound management initiated : 02/05/2015 Notes: Electronic Signature(s) Signed: 02/05/2015 4:42:55 PM By: Gretta Cool, RN, [name], Kim RN, [name] Entered By: Gretta Cool, RN, [name], Kim on 02/05/2015 10:50:50 [place], [name] ([id number]) -------------------------------------------------------------------------------- Pain Assessment  Details [name]. Date of Service: 02/05/2015 11:00 AM Medical Record Number: [id number] Patient Account Number: [id number] Date of Birth/Sex: January 01, 1956 (59 y.o. Female) Treating RN: Cornell Barman Primary Care Physician: Steele Sizer Other Clinician: Referring Physician: Steele Sizer Treating Physician/Extender: Frann Rider in Treatment: 6 Active Problems Location of Pain Severity and Description of Pain Patient Has Paino No Site Locations Pain Management and Medication Current Pain Management: Electronic Signature(s) Signed: 02/05/2015 4:42:55 PM By: Gretta Cool, RN, [name], Kim RN, [name] Entered By: Gretta Cool, RN, [name], Kim on 02/05/2015 10:39:44 [place], [name] ([id number]) -------------------------------------------------------------------------------- Patient/Caregiver Education Details [name]. Date of Service: 02/05/2015 11:00 AM Medical Record Number: [id number] Patient Account Number: [id number] Date of Birth/Gender: 06-Jul-1956 (59 y.o. Female) Treating RN: Montey Hora Primary Care Physician: Steele Sizer Other Clinician: Referring Physician: Steele Sizer Treating Physician/Extender: Frann Rider in Treatment: 6 Education Assessment Education Provided To: Patient Education Topics Provided Wound/Skin Impairment: Handouts: Other: keep dressing intact unti next visit Methods: Explain/Verbal Responses: State content correctly Electronic Signature(s) Signed: 02/05/2015 4:42:55 PM By: Gretta Cool, RN, [name], Kim RN, [name] Entered By: Gretta Cool, RN, [name], Kim on 02/05/2015 11:04:12 [place], [name] ([id number]) -------------------------------------------------------------------------------- Wound Assessment Details Patient Name: SHONI, QUIJAS. Date of Service: 02/05/2015 11:00 AM Medical Record Number: [id number] Patient Account Number: [id number] Date of Birth/Sex: [birth year] (59 y.o. Female) Treating RN: Cornell Barman Primary Care  Physician: Steele Sizer Other Clinician: Referring Physician: Steele Sizer Treating Physician/Extender: Frann Rider in Treatment: 6 Wound Status Wound Number: 1 Primary Pressure Ulcer Etiology: Wound Location: Right Back Wound Open Wounding Event: Blister Status: Date Acquired: 10/19/2013 Comorbid Glaucoma, Anemia, Coronary Artery Weeks Of Treatment: 6 History: Disease, Hypertension, Type II Clustered Wound: No Diabetes, End Stage Renal Disease, Osteoarthritis, Neuropathy Photos Photo Uploaded By: Gretta Cool, RN, [name], Kim on 02/05/2015 11:22:27 Wound Measurements Length: (cm) 3.2 Width: (cm) 6.5 Depth: (cm) 0.1 Area: (cm) 16.336 Volume: (cm) 1.634 % Reduction in Area: 57.6% % Reduction in Volume: 57.5% Epithelialization: Medium (34-66%) Tunneling: No Undermining: No Wound Description Classification: Category/Stage II Wound Margin: Distinct, outline attached Exudate Amount: Medium Exudate Type: Serosanguineous Exudate Color: red, brown Foul Odor After Cleansing: No Wound Bed Granulation Amount: Large (67-100%) Exposed Structure Granulation Quality: Red, Pink, Friable Fascia Exposed: No Necrotic Amount: None Present (0%) Fat Layer Exposed: No Saetern, Danila S. ([id number]) Tendon Exposed: No Muscle Exposed: No Joint Exposed: No Bone Exposed: No Limited to Skin Breakdown Periwound Skin Texture Texture Color No Abnormalities Noted: No No Abnormalities Noted: No Callus: No Atrophie Blanche: No Crepitus: No Cyanosis: No Excoriation: No Ecchymosis: No Fluctuance: No Erythema: No Friable: No Hemosiderin Staining: No Induration: No Mottled: No Localized Edema: No Pallor: No Rash: No Rubor: No Scarring: No Temperature / Pain Moisture Temperature: No Abnormality No Abnormalities Noted: No Dry / Scaly: No Maceration: No Moist: Yes Wound Preparation Ulcer Cleansing: Rinsed/Irrigated with Saline Topical Anesthetic Applied: None Treatment  Notes Wound #1 (Right Back) 1. Cleansed with: Clean wound with Normal Saline 2. Anesthetic Topical Lidocaine 4% cream to wound bed prior to debridement 4. Dressing Applied: Aquacel Ag 5. Secondary Dressing Applied Bordered  Foam Dressing Electronic Signature(s) Signed: 02/05/2015 4:42:55 PM By: Gretta Cool, RN, [name], Kim RN, [name] Entered By: Gretta Cool, RN, [name], Kim on 02/05/2015 10:49:30 [place], [name] ([id number]) -------------------------------------------------------------------------------- Wound Assessment Details Patient Name: MORRIS, MARKHAM. Date of Service: 02/05/2015 11:00 AM Medical Record Number: [id number] Patient Account Number: [id number] Date of Birth/Sex: 02-Aug-1955 (59 y.o. Female) Treating RN: Cornell Barman Primary Care Physician: Steele Sizer Other Clinician: Referring Physician: Steele Sizer Treating Physician/Extender: Frann Rider in Treatment: 6 Wound Status Wound Number: 3 Primary Venous Leg Ulcer Etiology: Wound Location: Right Lower Leg - Midline Wound Open Wounding Event: Blister Status: Date Acquired: 01/22/2015 Comorbid Glaucoma, Anemia, Coronary Artery Weeks Of Treatment: 0 History: Disease, Hypertension, Type II Clustered Wound: No Diabetes, End Stage Renal Disease, Osteoarthritis, Neuropathy Photos Photo Uploaded By: Gretta Cool, RN, [name], Kim on 02/05/2015 11:22:28 Wound Measurements Length: (cm) 5 Width: (cm) 2 Depth: (cm) 0.1 Area: (cm) 7.854 Volume: (cm) 0.785 % Reduction in Area: 0% % Reduction in Volume: 0% Wound Description Classification: Unclassifiable Diabetic Severity Earleen Newport): Grade 0 Wound Margin: Indistinct, nonvisible Wound Bed Exposed Structure Fascia Exposed: No Fat Layer Exposed: No Tendon Exposed: No Muscle Exposed: No Goda, Stella S. ([id number]) Joint Exposed: No Bone Exposed: No Limited to Skin Breakdown Periwound Skin Texture Texture Color No Abnormalities Noted: No No Abnormalities Noted: No Callus:  No Atrophie Blanche: No Crepitus: No Cyanosis: No Excoriation: No Ecchymosis: No Fluctuance: No Erythema: No Friable: No Hemosiderin Staining: No Induration: No Mottled: No Localized Edema: No Pallor: No Rash: No Rubor: No Scarring: No Moisture No Abnormalities Noted: No Dry / Scaly: No Maceration: No Moist: No Wound Preparation Topical Anesthetic Applied: Other: lidocaine4%, Assessment Notes intact blister Treatment Notes Wound #3 (Right, Midline Lower Leg) 1. Cleansed with: Clean wound with Normal Saline 2. Anesthetic Topical Lidocaine 4% cream to wound bed prior to debridement 4. Dressing Applied: Aquacel Ag 5. Secondary Dressing Applied Bordered Foam Dressing Electronic Signature(s) Signed: 02/05/2015 4:42:55 PM By: Gretta Cool, RN, [name], Kim RN, [name] Entered By: Gretta Cool, RN, [name], Kim on 02/05/2015 10:45:41 Slee, [name] ([id number]) -------------------------------------------------------------------------------- Vitals Details [name]. Date of Service: 02/05/2015 11:00 AM Medical Record Number: [id number] Patient Account Number: [id number] Date of Birth/Sex: [birth year] (59 y.o. Female) Treating RN: Cornell Barman Primary Care Physician: Steele Sizer Other Clinician: Referring Physician: Steele Sizer Treating Physician/Extender: Frann Rider in Treatment: 6 Vital Signs Time Taken: 10:39 Temperature (F): 98.3 Height (in): 65 Pulse (bpm): 83 Weight (lbs): 156 Respiratory Rate (breaths/min): 16 Body Mass Index (BMI): 26 Blood Pressure (mmHg): 131/68 Reference Range: 80 - 120 mg / dl Electronic Signature(s) Signed: 02/05/2015 4:42:55 PM By: Gretta Cool, RN, [name], Kim RN, [name] Entered By: Gretta Cool, RN, [name], Kim on 02/05/2015 10:40:00

## 2015-02-10 ENCOUNTER — Encounter: Payer: Medicare Other | Admitting: Surgery

## 2015-02-10 DIAGNOSIS — L89112 Pressure ulcer of right upper back, stage 2: Secondary | ICD-10-CM | POA: Diagnosis not present

## 2015-02-11 NOTE — Progress Notes (Signed)
TUWANA, KAPAUN ([id number]) Visit Report for 02/10/2015 Arrival Information Details [name], [name]. Date of Service: 02/10/2015 11:00 AM Medical Record Number: [id number] Patient Account Number: [id number] Date of Birth/Sex: Apr 22, 1956 (59 y.o. Female) Treating RN: Montey Hora Primary Care Physician: Steele Sizer Other Clinician: Referring Physician: Steele Sizer Treating Physician/Extender: Frann Rider in Treatment: 7 Visit Information History Since Last Visit Added or deleted any medications: No Patient Arrived: [place] Any new allergies or adverse reactions: No Arrival Time: 10:53 Had a fall or experienced change in No Accompanied By: SELF activities of daily living that may affect Transfer Assistance: None risk of falls: Patient Identification Verified: Yes Signs or symptoms of abuse/neglect since last No Secondary Verification Process Yes visito Completed: Hospitalized since last visit: No Patient Requires Transmission- No Pain Present Now: No Based Precautions: Patient Has Alerts: Yes Patient Alerts: Patient on Blood Thinner ABI R: 1.28 Electronic Signature(s) Signed: 02/10/2015 4:46:32 PM By: Montey Hora Entered By: Montey Hora on 02/10/2015 10:57:09 Sheriff, [name] ([id number]) -------------------------------------------------------------------------------- Clinic Level of Care Assessment Details [name]. Date of Service: 02/10/2015 11:00 AM Medical Record Number: [id number] Patient Account Number: [id number] Date of Birth/Sex: May 06, 1956 (59 y.o. Female) Treating RN: Montey Hora Primary Care Physician: Steele Sizer Other Clinician: Referring Physician: Steele Sizer Treating Physician/Extender: Frann Rider in Treatment: 7 Clinic Level of Care Assessment Items TOOL 4 Quantity Score []  - Use when only an EandM is performed on FOLLOW-UP visit 0 ASSESSMENTS - Nursing Assessment /  Reassessment X - Reassessment of Co-morbidities (includes updates in patient status) 1 10 X - Reassessment of Adherence to Treatment Plan 1 5 ASSESSMENTS - Wound and Skin Assessment / Reassessment []  - Simple Wound Assessment / Reassessment - one wound 0 X - Complex Wound Assessment / Reassessment - multiple wounds 2 5 []  - Dermatologic / Skin Assessment (not related to wound area) 0 ASSESSMENTS - Focused Assessment []  - Circumferential Edema Measurements - multi extremities 0 []  - Nutritional Assessment / Counseling / Intervention 0 X - Lower Extremity Assessment (monofilament, tuning fork, pulses) 1 5 []  - Peripheral Arterial Disease Assessment (using hand held doppler) 0 ASSESSMENTS - Ostomy and/or Continence Assessment and Care []  - Incontinence Assessment and Management 0 []  - Ostomy Care Assessment and Management (repouching, etc.) 0 PROCESS - Coordination of Care X - Simple Patient / Family Education for ongoing care 1 15 []  - Complex (extensive) Patient / Family Education for ongoing care 0 X - Staff obtains Programmer, systems, Records, Test Results / Process Orders 1 10 []  - Staff telephones HHA, Nursing Homes / Clarify orders / etc 0 []  - Routine Transfer to another Facility (non-emergent condition) 0 Fei, Jaqulyn S. ([id number]) []  - Routine Hospital Admission (non-emergent condition) 0 []  - New Admissions / Biomedical engineer / Ordering NPWT, Apligraf, etc. 0 []  - Emergency Hospital Admission (emergent condition) 0 X - Simple Discharge Coordination 1 10 []  - Complex (extensive) Discharge Coordination 0 PROCESS - Special Needs []  - Pediatric / Minor Patient Management 0 []  - Isolation Patient Management 0 []  - Hearing / Language / Visual special needs 0 []  - Assessment of Community assistance (transportation, D/C planning, etc.) 0 []  - Additional assistance / Altered mentation 0 []  - Support Surface(s) Assessment (bed, cushion, seat, etc.) 0 INTERVENTIONS - Wound Cleansing /  Measurement []  - Simple Wound Cleansing - one wound 0 X - Complex Wound Cleansing - multiple wounds 2 5 X - Wound Imaging (photographs - any number of wounds)  1 5 []  - Wound Tracing (instead of photographs) 0 []  - Simple Wound Measurement - one wound 0 X - Complex Wound Measurement - multiple wounds 2 5 INTERVENTIONS - Wound Dressings X - Small Wound Dressing one or multiple wounds 2 10 []  - Medium Wound Dressing one or multiple wounds 0 []  - Large Wound Dressing one or multiple wounds 0 []  - Application of Medications - topical 0 []  - Application of Medications - injection 0 INTERVENTIONS - Miscellaneous []  - External ear exam 0 Moxley, Davona S. ([id number]) []  - Specimen Collection (cultures, biopsies, blood, body fluids, etc.) 0 []  - Specimen(s) / Culture(s) sent or taken to Lab for analysis 0 []  - Patient Transfer (multiple staff / Harrel Lemon Lift / Similar devices) 0 []  - Simple Staple / Suture removal (25 or less) 0 []  - Complex Staple / Suture removal (26 or more) 0 []  - Hypo / Hyperglycemic Management (close monitor of Blood Glucose) 0 []  - Ankle / Brachial Index (ABI) - do not check if billed separately 0 X - Vital Signs 1 5 Has the patient been seen at the hospital within the last three years: Yes Total Score: 115 Level Of Care: New/Established - Level 3 Electronic Signature(s) Signed: 02/10/2015 4:46:32 PM By: Montey Hora Entered By: Montey Hora on 02/10/2015 11:22:58 Lemelin, [name] ([id number]) -------------------------------------------------------------------------------- Encounter Discharge Information Details Patient Name: [name], [name] Date of Service: 02/10/2015 11:00 AM Medical Record Number: [id number] Patient Account Number: [id number] Date of Birth/Sex: Jun 27, 1956 (59 y.o. Female) Treating RN: Montey Hora Primary Care Physician: Steele Sizer Other Clinician: Referring Physician: Steele Sizer Treating Physician/Extender: Frann Rider in  Treatment: 7 Encounter Discharge Information Items Discharge Pain Level: 0 Discharge Condition: Stable Ambulatory Status: [place] Discharge Destination: Home Private Transportation: Auto Accompanied By: self Schedule Follow-up Appointment: Yes Medication Reconciliation completed and No provided to Patient/Care Juanjose Mojica: Clinical Summary of Care: Electronic Signature(s) Signed: 02/10/2015 4:46:32 PM By: Montey Hora Entered By: Montey Hora on 02/10/2015 11:52:39 Yorke, [name] ([id number]) -------------------------------------------------------------------------------- Lower Extremity Assessment Details Patient Name: HUTML, Karsten S. Date of Service: 02/10/2015 11:00 AM Medical Record Number: [id number] Patient Account Number: [id number] Date of Birth/Sex: [birth year] (59 y.o. Female) Treating RN: Montey Hora Primary Care Physician: Steele Sizer Other Clinician: Referring Physician: Steele Sizer Treating Physician/Extender: Frann Rider in Treatment: 7 Edema Assessment Assessed: [Left: No] [Right: No] E[Left: dema] [Right: :] Calf Left: Right: Point of Measurement: 30 cm From Medial Instep cm 31 cm Ankle Left: Right: Point of Measurement: 11 cm From Medial Instep cm 20 cm Vascular Assessment Pulses: Posterior Tibial Palpable: [Right:Yes] Dorsalis Pedis Palpable: [Right:Yes] Extremity colors, hair growth, and conditions: Extremity Color: [Right:Normal] Hair Growth on Extremity: [Right:No] Temperature of Extremity: [Right:Warm] Capillary Refill: [Right:< 3 seconds] Electronic Signature(s) Signed: 02/10/2015 4:46:32 PM By: Montey Hora Entered By: Montey Hora on 02/10/2015 11:08:11 [place], [name] ([id number]) -------------------------------------------------------------------------------- Multi Wound Chart Details Patient Name: YFVCB, Tersa S. Date of Service: 02/10/2015 11:00 AM Medical Record Number: [id number] Patient Account  Number: [id number] Date of Birth/Sex: 05/18/1956 (59 y.o. Female) Treating RN: Montey Hora Primary Care Physician: Steele Sizer Other Clinician: Referring Physician: Steele Sizer Treating Physician/Extender: Frann Rider in Treatment: 7 Vital Signs Height(in): 65 Pulse(bpm): 71 Weight(lbs): 156 Blood Pressure 106/70 (mmHg): Body Mass Index(BMI): 26 Temperature(F): 98.2 Respiratory Rate 18 (breaths/min): Photos: [1:No Photos] [3:No Photos] [N/A:N/A] Wound Location: [1:Right Back] [3:Right, Midline Lower Leg N/A] Wounding Event: [1:Blister] [3:Blister] [N/A:N/A] Primary Etiology: [1:Pressure Ulcer] [3:Venous Leg Ulcer] [N/A:N/A] Comorbid History: [1:Glaucoma,  Anemia, Coronary Artery Disease, Coronary Artery Disease, Hypertension, Type II Diabetes, End Stage Renal Disease, Osteoarthritis, Neuropathy Osteoarthritis, Neuropathy] [3:Glaucoma, Anemia, Hypertension, Type II Diabetes,  End Stage Renal Disease,] [N/A:N/A] Date Acquired: [1:10/19/2013] [3:01/22/2015] [N/A:N/A] Weeks of Treatment: [1:7] [3:0] [N/A:N/A] Wound Status: [1:Open] [3:Open] [N/A:N/A] Measurements L x W x D 4.3x6.5x0.1 [3:6x2.8x0.1] [N/A:N/A] (cm) Area (cm) : [1:21.952] [3:13.195] [N/A:N/A] Volume (cm) : [1:2.195] [3:1.319] [N/A:N/A] % Reduction in Area: [1:43.00%] [3:-68.00%] [N/A:N/A] % Reduction in Volume: 43.00% [3:-68.00%] [N/A:N/A] Classification: [1:Category/Stage II] [3:Unclassifiable] [N/A:N/A] HBO Classification: [1:N/A] [3:Grade 0] [N/A:N/A] Exudate Amount: [1:Large] [3:Medium] [N/A:N/A] Exudate Type: [1:Purulent] [3:Purulent] [N/A:N/A] Exudate Color: [1:yellow, brown, green] [3:yellow, brown, green] [N/A:N/A] Wound Margin: [1:Distinct, outline attached Indistinct, nonvisible] [N/A:N/A] Granulation Amount: [1:Large (67-100%)] [3:None Present (0%)] [N/A:N/A] Granulation Quality: [1:Red, Pink, Friable] [3:N/A] [N/A:N/A] Necrotic Amount: [1:None Present (0%)] [3:None Present (0%)]  [N/A:N/A] Exposed Structures: [1:Fascia: No Fat: No] [3:Fascia: No Fat: No] [N/A:N/A] Tendon: No Tendon: No Muscle: No Muscle: No Joint: No Joint: No Bone: No Bone: No Limited to Skin Limited to Skin Breakdown Breakdown Epithelialization: Medium (34-66%) Large (67-100%) N/A Periwound Skin Texture: Edema: No Edema: No N/A Excoriation: No Excoriation: No Induration: No Induration: No Callus: No Callus: No Crepitus: No Crepitus: No Fluctuance: No Fluctuance: No Friable: No Friable: No Rash: No Rash: No Scarring: No Scarring: No Periwound Skin Moist: Yes Maceration: No N/A Moisture: Maceration: No Moist: No Dry/Scaly: No Dry/Scaly: No Periwound Skin Color: Atrophie Blanche: No Atrophie Blanche: No N/A Cyanosis: No Cyanosis: No Ecchymosis: No Ecchymosis: No Erythema: No Erythema: No Hemosiderin Staining: No Hemosiderin Staining: No Mottled: No Mottled: No Pallor: No Pallor: No Rubor: No Rubor: No Temperature: No Abnormality No Abnormality N/A Tenderness on No Yes N/A Palpation: Wound Preparation: Ulcer Cleansing: Ulcer Cleansing: N/A Rinsed/Irrigated with Rinsed/Irrigated with Saline Saline Topical Anesthetic Topical Anesthetic Applied: None Applied: Other: lidocaine4% Treatment Notes Electronic Signature(s) Signed: 02/10/2015 4:46:32 PM By: Montey Hora Entered By: Montey Hora on 02/10/2015 11:10:56 Edelen, [name] ([id number]) -------------------------------------------------------------------------------- [place] Details [name]. Date of Service: 02/10/2015 11:00 AM Medical Record Number: [id number] Patient Account Number: [id number] Date of Birth/Sex: January 10, 1956 (59 y.o. Female) Treating RN: Montey Hora Primary Care Physician: Steele Sizer Other Clinician: Referring Physician: Steele Sizer Treating Physician/Extender: Frann Rider in Treatment: 7 Active Inactive Abuse / Safety /  Falls / Self Care Management Nursing Diagnoses: Potential for falls Goals: Patient will remain injury free Date Initiated: 12/23/2014 Goal Status: Active Patient/caregiver will verbalize understanding of skin care regimen Date Initiated: 12/23/2014 Goal Status: Active Patient/caregiver will verbalize/demonstrate measures taken to prevent injury and/or falls Date Initiated: 12/23/2014 Goal Status: Active Patient/caregiver will verbalize/demonstrate understanding of what to do in case of emergency Date Initiated: 12/23/2014 Goal Status: Active Interventions: Assess fall risk on admission and as needed Assess: immobility, friction, shearing, incontinence upon admission and as needed Assess impairment of mobility on admission and as needed per policy Assess self care needs on admission and as needed Provide education on fall prevention Notes: Wound/Skin Impairment Nursing Diagnoses: Impaired tissue integrity Knowledge deficit related to ulceration/compromised skin integrity Goals: Patient/caregiver will verbalize understanding of skin care regimen KALLEN, DELATORRE ([id number]) Date Initiated: 12/23/2014 Goal Status: Active Ulcer/skin breakdown will heal within 14 weeks Date Initiated: 12/23/2014 Goal Status: Active Interventions: Assess patient/caregiver ability to obtain necessary supplies Assess patient/caregiver ability to perform ulcer/skin care regimen upon admission and as needed Assess ulceration(s) every visit Provide education on ulcer and skin care Treatment Activities: Skin care regimen initiated :  02/10/2015 Topical wound management initiated : 02/10/2015 Notes: Electronic Signature(s) Signed: 02/10/2015 4:46:32 PM By: Montey Hora Entered By: Montey Hora on 02/10/2015 11:10:48 Bruney, [name] ([id number]) -------------------------------------------------------------------------------- Patient/Caregiver Education Details [name]. Date of Service:  02/10/2015 11:00 AM Medical Record Number: [id number] Patient Account Number: [id number] Date of Birth/Gender: 1955/12/05 (58 y.o. Female) Treating RN: Montey Hora Primary Care Physician: Steele Sizer Other Clinician: Referring Physician: Steele Sizer Treating Physician/Extender: Frann Rider in Treatment: 7 Education Assessment Education Provided To: Patient Education Topics Provided Nutrition: Handouts: Other: nutrition and wound healing Methods: Explain/Verbal Responses: State content correctly Offloading: Handouts: Other: continue offloading shoulder Methods: Demonstration, Explain/Verbal Responses: State content correctly Electronic Signature(s) Signed: 02/10/2015 4:46:32 PM By: Montey Hora Entered By: Montey Hora on 02/10/2015 11:53:22 Fatima, [name] ([id number]) -------------------------------------------------------------------------------- Wound Assessment Details Patient Name: XNATF, Kiely S. Date of Service: 02/10/2015 11:00 AM Medical Record Number: [id number] Patient Account Number: [id number] Date of Birth/Sex: [birth year] (59 y.o. Female) Treating RN: Montey Hora Primary Care Physician: Steele Sizer Other Clinician: Referring Physician: Steele Sizer Treating Physician/Extender: Frann Rider in Treatment: 7 Wound Status Wound Number: 1 Primary Pressure Ulcer Etiology: Wound Location: Right Back Wound Open Wounding Event: Blister Status: Date Acquired: 10/19/2013 Comorbid Glaucoma, Anemia, Coronary Artery Weeks Of Treatment: 7 History: Disease, Hypertension, Type II Clustered Wound: No Diabetes, End Stage Renal Disease, Osteoarthritis, Neuropathy Photos Photo Uploaded By: Montey Hora on 02/10/2015 16:42:48 Wound Measurements Length: (cm) 4.3 Width: (cm) 6.5 Depth: (cm) 0.1 Area: (cm) 21.952 Volume: (cm) 2.195 % Reduction in Area: 43% % Reduction in Volume: 43% Epithelialization: Medium (34-66%) Tunneling:  No Undermining: No Wound Description Classification: Category/Stage II Wound Margin: Distinct, outline attached Exudate Amount: Large Exudate Type: Purulent Exudate Color: yellow, brown, green Foul Odor After Cleansing: No Wound Bed Granulation Amount: Large (67-100%) Exposed Structure Granulation Quality: Red, Pink, Friable Fascia Exposed: No Necrotic Amount: None Present (0%) Fat Layer Exposed: No [name], Leauna S. ([id number]) Tendon Exposed: No Muscle Exposed: No Joint Exposed: No Bone Exposed: No Limited to Skin Breakdown Periwound Skin Texture Texture Color No Abnormalities Noted: No No Abnormalities Noted: No Callus: No Atrophie Blanche: No Crepitus: No Cyanosis: No Excoriation: No Ecchymosis: No Fluctuance: No Erythema: No Friable: No Hemosiderin Staining: No Induration: No Mottled: No Localized Edema: No Pallor: No Rash: No Rubor: No Scarring: No Temperature / Pain Moisture Temperature: No Abnormality No Abnormalities Noted: No Dry / Scaly: No Maceration: No Moist: Yes Wound Preparation Ulcer Cleansing: Rinsed/Irrigated with Saline Topical Anesthetic Applied: None Treatment Notes Wound #1 (Right Back) 1. Cleansed with: Clean wound with Normal Saline 3. Peri-wound Care: Skin Prep 4. Dressing Applied: Hydrocolloid Other dressing (specify in notes) 5. Secondary Dressing Applied Bordered Foam Dressing Notes drawtex Electronic Signature(s) Signed: 02/10/2015 4:46:32 PM By: Montey Hora Entered By: Montey Hora on 02/10/2015 11:06:46 [place], [name] ([id number]) Zuba, [name] ([id number]) -------------------------------------------------------------------------------- Wound Assessment Details Patient Name: Roark, Adanya S. Date of Service: 02/10/2015 11:00 AM Medical Record Number: [id number] Patient Account Number: [id number] Date of Birth/Sex: 10-24-1955 (59 y.o. Female) Treating RN: Montey Hora Primary Care Physician: Steele Sizer Other Clinician: Referring Physician: Steele Sizer Treating Physician/Extender: Frann Rider in Treatment: 7 Wound Status Wound Number: 3 Primary Venous Leg Ulcer Etiology: Wound Location: Right, Midline Lower Leg Wound Open Wounding Event: Blister Status: Date Acquired: 01/22/2015 Comorbid Glaucoma, Anemia, Coronary Artery Weeks Of Treatment: 0 History: Disease, Hypertension, Type II Clustered Wound: No Diabetes, End Stage Renal Disease, Osteoarthritis, Neuropathy Photos Photo Uploaded By: Marjory Lies,  Joanna on 02/10/2015 16:42:50 Wound Measurements Length: (cm) 6 Width: (cm) 2.8 Depth: (cm) 0.1 Area: (cm) 13.195 Volume: (cm) 1.319 % Reduction in Area: -68% % Reduction in Volume: -68% Epithelialization: Large (67-100%) Tunneling: No Undermining: No Wound Description Classification: Unclassifiable Diabetic Severity Earleen Newport): Grade 0 Wound Margin: Indistinct, nonvisib Exudate Amount: Medium Exudate Type: Purulent Exudate Color: yellow, brown, green Foul Odor After Cleansing: No le Wound Bed Granulation Amount: None Present (0%) Exposed Structure Necrotic Amount: None Present (0%) Fascia Exposed: No Savard, Keenan S. ([id number]) Fat Layer Exposed: No Tendon Exposed: No Muscle Exposed: No Joint Exposed: No Bone Exposed: No Limited to Skin Breakdown Periwound Skin Texture Texture Color No Abnormalities Noted: No No Abnormalities Noted: No Callus: No Atrophie Blanche: No Crepitus: No Cyanosis: No Excoriation: No Ecchymosis: No Fluctuance: No Erythema: No Friable: No Hemosiderin Staining: No Induration: No Mottled: No Localized Edema: No Pallor: No Rash: No Rubor: No Scarring: No Temperature / Pain Moisture Temperature: No Abnormality No Abnormalities Noted: No Tenderness on Palpation: Yes Dry / Scaly: No Maceration: No Moist: No Wound Preparation Ulcer Cleansing: Rinsed/Irrigated with Saline Topical Anesthetic Applied: Other:  lidocaine4%, Treatment Notes Wound #3 (Right, Midline Lower Leg) 1. Cleansed with: Clean wound with Normal Saline 3. Peri-wound Care: Skin Prep 4. Dressing Applied: Aquacel Ag 5. Secondary Dressing Applied Bordered Foam Dressing Electronic Signature(s) Signed: 02/10/2015 4:46:32 PM By: Montey Hora Entered By: Montey Hora on 02/10/2015 11:07:30 [place]andenbos, [name] ([id number]) -------------------------------------------------------------------------------- Vitals Details [name]. Date of Service: 02/10/2015 11:00 AM Medical Record Number: [id number] Patient Account Number: [id number] Date of Birth/Sex: Jul 17, 1956 (59 y.o. Female) Treating RN: Montey Hora Primary Care Physician: Steele Sizer Other Clinician: Referring Physician: Steele Sizer Treating Physician/Extender: Frann Rider in Treatment: 7 Vital Signs Time Taken: 10:57 Temperature (F): 98.2 Height (in): 65 Pulse (bpm): 71 Weight (lbs): 156 Respiratory Rate (breaths/min): 18 Body Mass Index (BMI): 26 Blood Pressure (mmHg): 106/70 Reference Range: 80 - 120 mg / dl Electronic Signature(s) Signed: 02/10/2015 4:46:32 PM By: Montey Hora Entered By: Montey Hora on 02/10/2015 10:59:08

## 2015-02-11 NOTE — Progress Notes (Signed)
[name], [name] ([id number]) Visit Report for 02/10/2015 Chief Complaint Document Details [name], [name] 02/10/2015 11:00 Date of Service: AM Medical Record [id number] Number: Patient Account Number: [id number] September 19, 1955 (59 y.o. Treating RN: Date of Birth/Sex: Female) Other Clinician: Primary Care Physician: [name] Treating [name] Referring Physician: Steele [name] Physician/Extender: [name] in [name]: 7 Information Obtained from: Patient Chief Complaint Patient presents to the wound care center for a consult due non healing wound. She has an open wound on her right upper back which she's had for about a year and she recently noticed a blister on her right lower extremity about 2 [name] ago. Electronic Signature(s) Signed: 02/10/2015 11:26:52 AM By: [name] Fudge MD, [name] Entered By: [name] on 02/10/2015 11:26:51 [name], [name] ([id number]) -------------------------------------------------------------------------------- HPI Details [name], [name] 02/10/2015 11:00 Date of Service: AM Medical Record [id number] Number: Patient Account Number: [id number] Feb 14, 1956 (59 y.o. Treating RN: Date of Birth/Sex: Female) Other Clinician: Primary Care Physician: [name] Treating [name] Referring Physician: Steele [name] Physician/Extender: [name]: 7 History of Present Illness Location: right upper back and right lower extremity wounds Quality: Patient reports No Pain. Severity: Patient states wound (s) are getting better. Duration: Patient has had the wound for > 3 months prior to seeking [name] at the wound center Timing: she thought it first occurred when she was using a heating pad about a year ago after back surgery. Context: The wound appeared gradually over time Modifying Factors: Patient is currently on renal dialysis and receives treatments 3 times weekly Associated Signs and Symptoms:  Patient reports having: surgery scheduled for this week for a AV fistula left arm. HPI Description: 59 year old patient who is known to be a diabetic and has end-stage renal disease has had several comorbidities including coronary artery disease, hypertension, hyperlipidemia, pancreatitis, anemia, previous history of hysterectomy, cholecystectomy, left-sided salivary gland excision, bilateral cataract surgery,Peritoneal dialysis catheter, hemodialysis catheter. the area on the back has also been caused by instant pressure she used to sleep on a recliner all day and has significant kyphoscoliosis. As far as the wound on her right lower extremity she's not sure how this blister occurred but she thought it has been there for about 2 [name]. No recent blood investigations available and no recent hemoglobin A1c. 12/30/2014 -- she is an assisted living facility but I believe the nurses that have not followed instructions as she had some cream applied on her back and there was a different dressing. Last week she's had a AV fistula placed on her left forearm. 01/13/2015 -- she has had some localized infection at the port site and she's been on doxycycline for this. 02/05/2015 - he has developed a small blister on her right lower extremity. 02/10/2015 -- she has developed another small blister on her right anterior chest wall in the area where she's had tape for her dialysis access. this may just be injury caused by a tape burn. Electronic Signature(s) Signed: 02/10/2015 11:27:33 AM By: [name] Fudge MD, [name] Entered By: [name] on 02/10/2015 11:27:32 [name], [name] ([id number]) -------------------------------------------------------------------------------- Physical Exam Details [name], [name] 02/10/2015 11:00 Date of Service: AM Medical Record [id number] Number: Patient Account Number: [id number] [birth year] (59 y.o. Treating RN: Date of Birth/Sex: Female) Other  Clinician: Primary Care Physician: [name] Treating [name] Referring Physician: Steele [name] Physician/Extender: [name]: 7 Constitutional . Pulse regular. Respirations normal and unlabored. Afebrile. . Eyes Nonicteric. Reactive to light. Ears, Nose, Mouth, and Throat  Lips, teeth, and gums WNL.Marland Kitchen Moist mucosa without lesions . Neck supple and nontender. No palpable supraclavicular or cervical adenopathy. Normal sized without goiter. Respiratory WNL. No retractions.. Cardiovascular Pedal Pulses WNL. No clubbing, cyanosis or edema. Musculoskeletal Adexa without tenderness or enlargement.. Digits and nails w/o clubbing, cyanosis, infection, petechiae, ischemia, or inflammatory conditions.. Integumentary (Hair, Skin) No suspicious lesions. No crepitus or fluctuance. No peri-wound warmth or erythema. No masses.Marland Kitchen Psychiatric Judgement and insight Intact.. No evidence of depression, anxiety, or agitation.. Notes The blister on the right lower extremity has discharged fluid and the skin is intact and I will leave this and apply a silver alginate dressing over this. On her back I am going to use a piece of duoderm and a piece of DrawTex over this and put a bolster dressing and leave this intact for a week. The new blister on her right anterior chest wall will be covered with a nonadherent dressing and bordered foam. Electronic Signature(s) Signed: 02/10/2015 11:30:07 AM By: [name] Fudge MD, [name] Entered By: [name] on 02/10/2015 11:30:07 [name], [name] ([id number]) -------------------------------------------------------------------------------- Physician Orders Details [name], [name] 02/10/2015 11:00 Date of Service: AM Medical Record [id number] Number: Patient Account Number: [id number] 26-Jul-1955 (59 y.o. Treating RN: [name] Date of Birth/Sex: Female) Other Clinician: Primary Care Physician: [name]  Treating [name] Referring Physician: Steele [name] Physician/Extender: [name] in [name]: 7 Verbal / Phone Orders: Yes Clinician: Montey [name] Read Back and Verified: Yes Diagnosis Coding Wound Cleansing Wound #1 Right Back o May shower with protection. Wound #3 Right,Midline Lower Leg o May shower with protection. Skin Barriers/Peri-Wound Care Wound #1 Right Back o Skin Prep Wound #3 Right,Midline Lower Leg o Skin Prep Primary Wound Dressing Wound #1 Right Back o Hydrocolloid o Drawtex Wound #3 Right,Midline Lower Leg o Aquacel Ag Secondary Dressing Wound #1 Right Back o Boardered Foam Dressing Wound #3 Right,Midline Lower Leg o Boardered Foam Dressing Dressing Change Frequency Wound #1 Right Back o Change dressing every week - to be changed at wound care center next visit - only change at facility if dressing becomes soiled or comes off [name], [name] ([id number]) Wound #3 Right,Midline Lower Leg o Change dressing every week - to be changed at wound care center next visit - only change at facility if dressing becomes soiled or comes off Follow-up Appointments Wound #1 Right Back o Return Appointment in 1 week. Wound #3 Right,Midline Lower Leg o Return Appointment in 1 week. Electronic Signature(s) Signed: 02/10/2015 12:36:54 PM By: [name] Fudge MD, [name] Signed: 02/10/2015 4:46:32 PM By: [name] Entered By: [name] on 02/10/2015 11:21:55 Zunker, [name] ([id number]) -------------------------------------------------------------------------------- Problem List Details [name], [name] 02/10/2015 11:00 Date of Service: AM Medical Record [id number] Number: Patient Account Number: [id number] April 25, 1956 (59 y.o. Treating RN: Date of Birth/Sex: Female) Other Clinician: Primary Care Physician: [name] Treating [name] Referring Physician: Steele [name] Physician/Extender: [name] in  [name]: 7 Active Problems ICD-10 Encounter Code Description Active Date Diagnosis E11.622 Type 2 diabetes mellitus with other skin ulcer 12/23/2014 Yes L89.112 Pressure ulcer of right upper back, stage 2 12/23/2014 Yes S80.821A Blister (nonthermal), right lower leg, initial encounter 12/23/2014 Yes N18.6 End stage renal disease 12/23/2014 Yes Inactive Problems Resolved Problems Electronic Signature(s) Signed: 02/10/2015 11:26:42 AM By: [name] Fudge MD, [name] Entered By: [name] on 02/10/2015 11:26:42 Senft, [name] ([id number]) -------------------------------------------------------------------------------- Progress Note Details [name], [name]. 02/10/2015 11:00 Date of Service: AM Medical Record [id number] Number: Patient Account  Number: [id number] 1956-02-07 (59 y.o. Treating RN: Date of Birth/Sex: Female) Other Clinician: Primary Care Physician: [name] Treating [name] Referring Physician: Steele [name] Physician/Extender: [name] in [name]: 7 Subjective Chief Complaint Information obtained from Patient Patient presents to the wound care center for a consult due non healing wound. She has an open wound on her right upper back which she's had for about a year and she recently noticed a blister on her right lower extremity about 2 [name] ago. History of Present Illness (HPI) The following HPI elements were documented for the patient's wound: Location: right upper back and right lower extremity wounds Quality: Patient reports No Pain. Severity: Patient states wound (s) are getting better. Duration: Patient has had the wound for > 3 months prior to seeking [name] at the wound center Timing: she thought it first occurred when she was using a heating pad about a year ago after back surgery. Context: The wound appeared gradually over time Modifying Factors: Patient is currently on renal dialysis and receives treatments 3 times weekly Associated  Signs and Symptoms: Patient reports having: surgery scheduled for this week for a AV fistula left arm. 59 year old patient who is known to be a diabetic and has end-stage renal disease has had several comorbidities including coronary artery disease, hypertension, hyperlipidemia, pancreatitis, anemia, previous history of hysterectomy, cholecystectomy, left-sided salivary gland excision, bilateral cataract surgery,Peritoneal dialysis catheter, hemodialysis catheter. the area on the back has also been caused by instant pressure she used to sleep on a recliner all day and has significant kyphoscoliosis. As far as the wound on her right lower extremity she's not sure how this blister occurred but she thought it has been there for about 2 [name]. No recent blood investigations available and no recent hemoglobin A1c. 12/30/2014 -- she is an assisted living facility but I believe the nurses that have not followed instructions as she had some cream applied on her back and there was a different dressing. Last week she's had a AV fistula placed on her left forearm. 01/13/2015 -- she has had some localized infection at the port site and she's been on doxycycline for this. 02/05/2015 - he has developed a small blister on her right lower extremity. 02/10/2015 -- she has developed another small blister on her right anterior chest wall in the area where she's had tape for her dialysis access. this may just be injury caused by a tape burn. [name] ([id number]) Objective Constitutional Pulse regular. Respirations normal and unlabored. Afebrile. Vitals Time Taken: 10:57 AM, Height: 65 in, Weight: 156 lbs, BMI: 26, Temperature: 98.2 F, Pulse: 71 bpm, Respiratory Rate: 18 breaths/min, Blood Pressure: 106/70 mmHg. Eyes Nonicteric. Reactive to light. Ears, Nose, Mouth, and Throat Lips, teeth, and gums WNL.Marland Kitchen Moist mucosa without lesions . Neck supple and nontender. No palpable supraclavicular or  cervical adenopathy. Normal sized without goiter. Respiratory WNL. No retractions.. Cardiovascular Pedal Pulses WNL. No clubbing, cyanosis or edema. Musculoskeletal Adexa without tenderness or enlargement.. Digits and nails w/o clubbing, cyanosis, infection, petechiae, ischemia, or inflammatory conditions.Marland Kitchen Psychiatric Judgement and insight Intact.. No evidence of depression, anxiety, or agitation.. General Notes: The blister on the right lower extremity has discharged fluid and the skin is intact and I will leave this and apply a silver alginate dressing over this. On her back I am going to use a piece of duoderm and a piece of DrawTex over this and put a bolster dressing and leave this intact for a week. The new blister on her right  anterior chest wall will be covered with a nonadherent dressing and bordered foam. Integumentary (Hair, Skin) No suspicious lesions. No crepitus or fluctuance. No peri-wound warmth or erythema. No masses.. Wound #1 status is Open. Original cause of wound was Blister. The wound is located on the Right Back. The wound measures 4.3cm length x 6.5cm width x 0.1cm depth; 21.952cm^2 area and 2.195cm^3 volume. The wound is limited to skin breakdown. There is no tunneling or undermining noted. There is a large amount of purulent drainage noted. The wound margin is distinct with the outline attached to the wound [name], [name] ([id number]) base. There is large (67-100%) red, pink, friable granulation within the wound bed. There is no necrotic tissue within the wound bed. The periwound skin appearance exhibited: Moist. The periwound skin appearance did not exhibit: Callus, Crepitus, Excoriation, Fluctuance, Friable, Induration, Localized Edema, Rash, Scarring, Dry/Scaly, Maceration, Atrophie Blanche, Cyanosis, Ecchymosis, Hemosiderin Staining, Mottled, Pallor, Rubor, Erythema. Periwound temperature was noted as No Abnormality. Wound #3 status is Open. Original cause of  wound was Blister. The wound is located on the Right,Midline Lower Leg. The wound measures 6cm length x 2.8cm width x 0.1cm depth; 13.195cm^2 area and 1.319cm^3 volume. The wound is limited to skin breakdown. There is no tunneling or undermining noted. There is a medium amount of purulent drainage noted. The wound margin is indistinct and nonvisible. There is no granulation within the wound bed. There is no necrotic tissue within the wound bed. The periwound skin appearance did not exhibit: Callus, Crepitus, Excoriation, Fluctuance, Friable, Induration, Localized Edema, Rash, Scarring, Dry/Scaly, Maceration, Moist, Atrophie Blanche, Cyanosis, Ecchymosis, Hemosiderin Staining, Mottled, Pallor, Rubor, Erythema. Periwound temperature was noted as No Abnormality. The periwound has tenderness on palpation. Assessment Active Problems ICD-10 E11.622 - Type 2 diabetes mellitus with other skin ulcer L89.112 - Pressure ulcer of right upper back, stage 2 S80.821A - Blister (nonthermal), right lower leg, initial encounter N18.6 - End stage renal disease She has an appointment with her dermatologist next Tuesday and will come back and see me after this. We will continue the local dressing. The blister on the right lower extremity has discharged fluid and the skin is intact and I will leave this and apply a silver alginate dressing over this. On her back I am going to use a piece of duoderm and a piece of DrawTex over this and put a bolster dressing and leave this intact for a week. The new blister on her right anterior chest wall will be covered with a nonadherent dressing and bordered foam. Plan Wound Cleansing: Wound #1 Right Back: May shower with protection. Wound #3 Right,Midline Lower Leg: May shower with protection. Skin Barriers/Peri-Wound Care: [name], [name] ([id number]) Wound #1 Right Back: Skin Prep Wound #3 Right,Midline Lower Leg: Skin Prep Primary Wound Dressing: Wound #1 Right  Back: Hydrocolloid Drawtex Wound #3 Right,Midline Lower Leg: Aquacel Ag Secondary Dressing: Wound #1 Right Back: Boardered Foam Dressing Wound #3 Right,Midline Lower Leg: Boardered Foam Dressing Dressing Change Frequency: Wound #1 Right Back: Change dressing every week - to be changed at wound care center next visit - only change at facility if dressing becomes soiled or comes off Wound #3 Right,Midline Lower Leg: Change dressing every week - to be changed at wound care center next visit - only change at facility if dressing becomes soiled or comes off Follow-up Appointments: Wound #1 Right Back: Return Appointment in 1 week. Wound #3 Right,Midline Lower Leg: Return Appointment in 1 week. She has an appointment with  her dermatologist next Tuesday and will come back and see me after this. We will continue the local dressing. The blister on the right lower extremity has discharged fluid and the skin is intact and I will leave this and apply a silver alginate dressing over this. On her back I am going to use a piece of duoderm and a piece of DrawTex over this and put a bolster dressing and leave this intact for a week. The new blister on her right anterior chest wall will be covered with a nonadherent dressing and bordered foam. Electronic Signature(s) Signed: 02/10/2015 11:31:37 AM By: [name] Fudge MD, [name] Entered By: [name] on 02/10/2015 11:31:37 [name], [name] ([id number]) -------------------------------------------------------------------------------- SuperBill Details [name], Tamyrah S. Date of Service: 02/10/2015 Medical Record Number: [id number] Patient Account Number: [id number] Date of Birth/Sex: August 06, 1955 (59 y.o. Female) Treating RN: Primary Care Physician: [name] Other Clinician: Referring Physician: Steele [name] Treating Physician/Extender: Frann Rider in [name]: 7 Diagnosis Coding ICD-10 Codes Code Description E11.622  Type 2 diabetes mellitus with other skin ulcer L89.112 Pressure ulcer of right upper back, stage 2 S80.821A Blister (nonthermal), right lower leg, initial encounter N18.6 End stage renal disease Facility Procedures CPT4 Code: [id number] Description: 99213 - WOUND CARE VISIT-LEV 3 EST PT Modifier: Quantity: 1 Physician Procedures CPT4 Code: [id number] Description: 64403 - WC PHYS LEVEL 3 - EST PT ICD-10 Description Diagnosis E11.622 Type 2 diabetes mellitus with other skin ulcer L89.112 Pressure ulcer of right upper back, stage 2 S80.821A Blister (nonthermal), right lower leg, initial enc Modifier: ounter Quantity: 1 Electronic Signature(s) Signed: 02/10/2015 11:31:52 AM By: [name] Fudge MD, [name] Entered By: [name] on 02/10/2015 11:31:51

## 2015-02-12 ENCOUNTER — Ambulatory Visit
Admission: RE | Admit: 2015-02-12 | Discharge: 2015-02-12 | Disposition: A | Discharge status: Skilled Nursing Facility | Payer: Medicare Other | Source: Ambulatory Visit | Attending: Vascular Surgery | Admitting: Vascular Surgery

## 2015-02-12 ENCOUNTER — Encounter: Payer: Self-pay | Admitting: *Deleted

## 2015-02-12 ENCOUNTER — Encounter
Admission: RE | Disposition: A | Discharge status: Skilled Nursing Facility | Payer: Self-pay | Source: Ambulatory Visit | Attending: Vascular Surgery

## 2015-02-12 DIAGNOSIS — I2510 Atherosclerotic heart disease of native coronary artery without angina pectoris: Secondary | ICD-10-CM | POA: Insufficient documentation

## 2015-02-12 DIAGNOSIS — Z794 Long term (current) use of insulin: Secondary | ICD-10-CM | POA: Diagnosis not present

## 2015-02-12 DIAGNOSIS — Z79899 Other long term (current) drug therapy: Secondary | ICD-10-CM | POA: Diagnosis not present

## 2015-02-12 DIAGNOSIS — E1122 Type 2 diabetes mellitus with diabetic chronic kidney disease: Secondary | ICD-10-CM | POA: Insufficient documentation

## 2015-02-12 DIAGNOSIS — T82858A Stenosis of vascular prosthetic devices, implants and grafts, initial encounter: Secondary | ICD-10-CM | POA: Insufficient documentation

## 2015-02-12 DIAGNOSIS — N186 End stage renal disease: Secondary | ICD-10-CM | POA: Diagnosis not present

## 2015-02-12 DIAGNOSIS — Z992 Dependence on renal dialysis: Secondary | ICD-10-CM | POA: Diagnosis not present

## 2015-02-12 DIAGNOSIS — I252 Old myocardial infarction: Secondary | ICD-10-CM | POA: Diagnosis not present

## 2015-02-12 DIAGNOSIS — I120 Hypertensive chronic kidney disease with stage 5 chronic kidney disease or end stage renal disease: Secondary | ICD-10-CM | POA: Insufficient documentation

## 2015-02-12 DIAGNOSIS — E785 Hyperlipidemia, unspecified: Secondary | ICD-10-CM | POA: Diagnosis not present

## 2015-02-12 DIAGNOSIS — I509 Heart failure, unspecified: Secondary | ICD-10-CM | POA: Insufficient documentation

## 2015-02-12 DIAGNOSIS — E039 Hypothyroidism, unspecified: Secondary | ICD-10-CM | POA: Diagnosis not present

## 2015-02-12 DIAGNOSIS — I499 Cardiac arrhythmia, unspecified: Secondary | ICD-10-CM | POA: Insufficient documentation

## 2015-02-12 DIAGNOSIS — Z7982 Long term (current) use of aspirin: Secondary | ICD-10-CM | POA: Insufficient documentation

## 2015-02-12 DIAGNOSIS — Z7902 Long term (current) use of antithrombotics/antiplatelets: Secondary | ICD-10-CM | POA: Insufficient documentation

## 2015-02-12 DIAGNOSIS — Y832 Surgical operation with anastomosis, bypass or graft as the cause of abnormal reaction of the patient, or of later complication, without mention of misadventure at the time of the procedure: Secondary | ICD-10-CM | POA: Insufficient documentation

## 2015-02-12 DIAGNOSIS — Y841 Kidney dialysis as the cause of abnormal reaction of the patient, or of later complication, without mention of misadventure at the time of the procedure: Secondary | ICD-10-CM | POA: Diagnosis not present

## 2015-02-12 HISTORY — PX: PERIPHERAL VASCULAR CATHETERIZATION: SHX172C

## 2015-02-12 LAB — POTASSIUM (ARMC VASCULAR LAB ONLY): POTASSIUM (ARMC VASCULAR LAB): 4.2

## 2015-02-12 SURGERY — A/V SHUNTOGRAM/FISTULAGRAM
Anesthesia: Moderate Sedation

## 2015-02-12 MED ORDER — CEFAZOLIN SODIUM 1-5 GM-% IV SOLN
INTRAVENOUS | Status: AC
  Filled 2015-02-12: qty 50

## 2015-02-12 MED ORDER — HEPARIN SODIUM (PORCINE) 1000 UNIT/ML IJ SOLN
INTRAMUSCULAR | Status: AC
  Filled 2015-02-12: qty 1

## 2015-02-12 MED ORDER — FENTANYL CITRATE (PF) 100 MCG/2ML IJ SOLN: 25.0000 ug | INTRAMUSCULAR | Status: DC | PRN

## 2015-02-12 MED ORDER — IOHEXOL 300 MG/ML  SOLN
INTRAMUSCULAR | Status: DC | PRN
  Administered 2015-02-12: 30 mL via INTRAVENOUS

## 2015-02-12 MED ORDER — CEFAZOLIN SODIUM 1-5 GM-% IV SOLN
1.0000 g | Freq: Once | INTRAVENOUS | Status: AC
Start: 2015-02-12 — End: 2015-02-12
  Administered 2015-02-12: 1 g via INTRAVENOUS

## 2015-02-12 MED ORDER — FENTANYL CITRATE (PF) 100 MCG/2ML IJ SOLN
INTRAMUSCULAR | Status: DC | PRN
Start: 2015-02-12 — End: 2015-02-12
  Administered 2015-02-12 (×2): 50 ug via INTRAVENOUS

## 2015-02-12 MED ORDER — HEPARIN SODIUM (PORCINE) 1000 UNIT/ML IJ SOLN
INTRAMUSCULAR | Status: DC | PRN
  Administered 2015-02-12: 3000 [IU] via INTRAVENOUS

## 2015-02-12 MED ORDER — FENTANYL CITRATE (PF) 100 MCG/2ML IJ SOLN
INTRAMUSCULAR | Status: AC
  Filled 2015-02-12: qty 2

## 2015-02-12 MED ORDER — SODIUM CHLORIDE 0.9 % IV SOLN
INTRAVENOUS | Status: DC
  Administered 2015-02-12 (×2): via INTRAVENOUS

## 2015-02-12 MED ORDER — HEPARIN (PORCINE) IN NACL 2-0.9 UNIT/ML-% IJ SOLN
INTRAMUSCULAR | Status: AC
  Filled 2015-02-12: qty 1000

## 2015-02-12 MED ORDER — MIDAZOLAM HCL 2 MG/2ML IJ SOLN
INTRAMUSCULAR | Status: DC | PRN
  Administered 2015-02-12 (×2): 2 mg via INTRAVENOUS

## 2015-02-12 MED ORDER — MIDAZOLAM HCL 2 MG/2ML IJ SOLN
INTRAMUSCULAR | Status: AC
Start: 2015-02-12 — End: 2015-02-12
  Filled 2015-02-12: qty 4

## 2015-02-12 MED ORDER — LIDOCAINE-EPINEPHRINE (PF) 1 %-1:200000 IJ SOLN
INTRAMUSCULAR | Status: AC
  Filled 2015-02-12: qty 30

## 2015-02-12 MED ORDER — ONDANSETRON HCL 4 MG/2ML IJ SOLN: 4.0000 mg | Freq: Once | INTRAMUSCULAR | Status: DC | PRN

## 2015-02-12 SURGICAL SUPPLY — 14 items
BALLN DORADO 5X40X80 (BALLOONS) ×3
BALLN LUTONIX DCB 4X60X130 (BALLOONS) ×3
BALLOON DORADO 5X40X80 (BALLOONS) ×1 IMPLANT
BALLOON LUTONIX DCB 4X60X130 (BALLOONS) ×1 IMPLANT
CANNULA 5F STIFF (CANNULA) ×3 IMPLANT
CATH KUMPE (CATHETERS) ×2
CATH SLIP 5FR .038X65 KMP (CATHETERS) ×1
CATH SLIP 5FR 0.38 X 40 KMP (CATHETERS) ×1 IMPLANT
DRAPE BRACHIAL (DRAPES) ×3 IMPLANT
GLIDEWIRE STIFF .35X180X3 HYDR (WIRE) ×3 IMPLANT
PACK ANGIOGRAPHY (CUSTOM PROCEDURE TRAY) ×3 IMPLANT
SHEATH BRITE TIP 6FRX5.5 (SHEATH) ×3 IMPLANT
TOWEL OR 17X26 4PK STRL BLUE (TOWEL DISPOSABLE) ×3 IMPLANT
WIRE MAGIC TOR.035 180C (WIRE) ×3 IMPLANT

## 2015-02-12 NOTE — OR Nursing (Signed)
[facility] reported blood sugar at 6 am was 248. No insulin coverage.

## 2015-02-12 NOTE — H&P (Signed)
[facility] History & Physical Update  The patient was interviewed and re-examined.  The patient's previous History and Physical has been reviewed and is unchanged.  There is no change in the plan of care. We plan to proceed with the scheduled procedure.  Bertrum Helmstetter, MD  02/12/2015, 9:24 AM

## 2015-02-12 NOTE — Discharge Instructions (Signed)
Fistulogram, Care After °Refer to this sheet in the next few weeks. These instructions provide you with information on caring for yourself after your procedure. Your health care provider may also give you more specific instructions. Your treatment has been planned according to current medical practices, but problems sometimes occur. Call your health care provider if you have any problems or questions after your procedure. °WHAT TO EXPECT AFTER THE PROCEDURE °After your procedure, it is typical to have the following: °· A small amount of discomfort in the area where the catheters were placed. °· A small amount of bruising around the fistula. °· Sleepiness and fatigue. °HOME CARE INSTRUCTIONS °· Rest at home for the day following your procedure. °· Do not drive or operate heavy machinery while taking pain medicine. °· Take medicines only as directed by your health care provider. °· Do not take baths, swim, or use a hot tub until your health care provider approves. You may shower 24 hours after the procedure or as directed by your health care provider. °· There are many different ways to close and cover an incision, including stitches, skin glue, and adhesive strips. Follow your health care provider's instructions on: °¨ Incision care. °¨ Bandage (dressing) changes and removal. °¨ Incision closure removal. °· Monitor your dialysis fistula carefully. °SEEK MEDICAL CARE IF: °· You have drainage, redness, swelling, or pain at your catheter site. °· You have a fever. °· You have chills. °SEEK IMMEDIATE MEDICAL CARE IF: °· You feel weak. °· You have trouble balancing. °· You have trouble moving your arms or legs. °· You have problems with your speech or vision. °· You can no longer feel a vibration or buzz when you put your fingers over your dialysis fistula. °· The limb that was used for the procedure: °¨ Swells. °¨ Is painful. °¨ Is cold. °¨ Is discolored, such as blue or pale white. °Document Released: 11/18/2013  Document Reviewed: 08/23/2013 °Exit[employer]® Patient Information ©2015 [employer]. This information is not intended to replace advice given to you by your health care provider. Make sure you discuss any questions you have with your health care provider. ° °

## 2015-02-12 NOTE — OR Nursing (Signed)
Large blister right chest beside perm cath, multiple broken blisters around right breast, pt reports these have been there since Feb 2016, dressings right thigh covers blister per pt, and one on back from burn secondary to heating pad per pt. [employer] Development worker, international aid.

## 2015-02-12 NOTE — Op Note (Signed)
[facility]    OPERATIVE NOTE   PROCEDURE: 1.   Left brachiocephalic arteriovenous fistula cannulation under ultrasound guidance 2.   Left arm fistulagram including central venogram 3.   Percutaneous transluminal angioplasty of perianastomotic stenosis with 4 mm diameter drug-coated and 5 mm diameter high pressure angioplasty balloon  PRE-OPERATIVE DIAGNOSIS: 1. ESRD 2. Poorly functional left brachiocephalic AVF  POST-OPERATIVE DIAGNOSIS: same as above   SURGEON: Leotis Pain, MD  ANESTHESIA: local with MCS  ESTIMATED BLOOD LOSS: 25 cc  FINDING(S): 1. Near occlusive stenosis from intimal hyperplasia at the anastomosis tracking the first couple of centimeters into the cephalic vein  SPECIMEN(S):  None  CONTRAST: 30 cc  INDICATIONS: [name] is a 59 y.o. female who presents with malfunctioning  left brachiocephalic arteriovenous fistula. The fistula is not maturing and cannot be used for her dialysis yet. The patient is scheduled for  left arm fistulagram.  The patient is aware the risks include but are not limited to: bleeding, infection, thrombosis of the cannulated access, and possible anaphylactic reaction to the contrast.  The patient is aware of the risks of the procedure and elects to proceed forward.  DESCRIPTION: After full informed written consent was obtained, the patient was brought back to the angiography suite and placed supine upon the angiography table.  The patient was connected to monitoring equipment.  The  left arm was prepped and draped in the standard fashion for a percutaneous access intervention.  Under ultrasound guidance, the  left brachiocephalic arteriovenous fistula was cannulated in a retrograde fashion in the proximal to mid upper arm with a micropuncture needle under direct ultrasound guidance and a permanent image was performed.  The microwire was advanced into the fistula and the needle was exchanged for the a microsheath.  I  then upsized to a 6 Fr Sheath and imaging was performed.  Hand injections were completed to image the access including the central venous system. This demonstrated a string sign with near occlusion in the perianastomotic region tracking the first couple of centimeters into the cephalic vein.  Based on the images, this patient will need treatment of this area to salvage the fistula and potentially make it functional. I then gave the patient 3000 units of intravenous heparin.  I then crossed the stenosis with a Magic Tourqe wire.  Based on the imaging, a 4 mm x 6 cm  Lutonix drug-coated angioplasty balloon was selected.  The balloon was centered around the perianastomotic stenosis and inflated to 12 ATM for 1 minute(s).  This was slightly undersized, so I upsized to a 5 mm diameter by 4 cm length high pressure angioplasty balloon and inflated in the same location again up to 16 atm. On completion imaging, a 10-15% residual stenosis was present.     Based on the completion imaging, no further intervention is necessary.  The wire and balloon were removed from the sheath.  A 4-0 Monocryl purse-string suture was sewn around the sheath.  The sheath was removed while tying down the suture.  A sterile bandage was applied to the puncture site.  COMPLICATIONS: None  CONDITION: Stable   [name]  02/12/2015 9:26 AM

## 2015-02-13 ENCOUNTER — Encounter: Payer: Self-pay | Admitting: Vascular Surgery

## 2015-02-19 ENCOUNTER — Encounter: Payer: Medicare Other | Attending: Surgery | Admitting: Surgery

## 2015-02-19 DIAGNOSIS — L89112 Pressure ulcer of right upper back, stage 2: Secondary | ICD-10-CM | POA: Insufficient documentation

## 2015-02-19 DIAGNOSIS — N186 End stage renal disease: Secondary | ICD-10-CM | POA: Diagnosis not present

## 2015-02-19 DIAGNOSIS — E785 Hyperlipidemia, unspecified: Secondary | ICD-10-CM | POA: Diagnosis not present

## 2015-02-19 DIAGNOSIS — I2510 Atherosclerotic heart disease of native coronary artery without angina pectoris: Secondary | ICD-10-CM | POA: Insufficient documentation

## 2015-02-19 DIAGNOSIS — Z992 Dependence on renal dialysis: Secondary | ICD-10-CM | POA: Insufficient documentation

## 2015-02-19 DIAGNOSIS — X58XXXA Exposure to other specified factors, initial encounter: Secondary | ICD-10-CM | POA: Diagnosis not present

## 2015-02-19 DIAGNOSIS — E11622 Type 2 diabetes mellitus with other skin ulcer: Secondary | ICD-10-CM | POA: Diagnosis not present

## 2015-02-19 DIAGNOSIS — I120 Hypertensive chronic kidney disease with stage 5 chronic kidney disease or end stage renal disease: Secondary | ICD-10-CM | POA: Insufficient documentation

## 2015-02-19 DIAGNOSIS — D649 Anemia, unspecified: Secondary | ICD-10-CM | POA: Insufficient documentation

## 2015-02-19 DIAGNOSIS — S80821A Blister (nonthermal), right lower leg, initial encounter: Secondary | ICD-10-CM | POA: Insufficient documentation

## 2015-02-19 NOTE — Progress Notes (Signed)
EDNAH, HAMMOCK ([id number]) Visit Report for 02/19/2015 Chief Complaint Document Details [name], [name]. Date of Service: 02/19/2015 10:15 AM Medical Record Patient Account Number: [id number] Number: [name], RN, [name], Treating RN: 12-12-1955 (59 y.o. [name] Date of Birth/Sex: Female) Other Clinician: Primary Care Physician: Steele Sizer Treating [name] Referring Physician: Steele Sizer Physician/Extender: Suella Grove in Treatment: 8 Information Obtained from: Patient Chief Complaint Patient presents to the wound care center for a consult due non healing wound. She has an open wound on her right upper back which she's had for about a year and she recently noticed a blister on her right lower extremity about 2 weeks ago. Electronic Signature(s) Signed: 02/19/2015 10:31:09 AM By: [name], [name] Entered By: [name] on 02/19/2015 10:31:08 Bellanca, [name] ([id number]) -------------------------------------------------------------------------------- HPI Details [name], [name]. Date of Service: 02/19/2015 10:15 AM Medical Record Patient Account Number: [id number] Number: [name], RN, [name], Treating RN: 1956/02/16 (59 y.o. [name] Date of Birth/Sex: Female) Other Clinician: Primary Care Physician: Steele Sizer Treating [name] Referring Physician: Steele Sizer Physician/Extender: Suella Grove in Treatment: 8 History of Present Illness Location: right upper back and right lower extremity wounds Quality: Patient reports No Pain. Severity: Patient states wound (s) are getting better. Duration: Patient has had the wound for > 3 months prior to seeking treatment at the wound center Timing: she thought it first occurred when she was using a heating pad about a year ago after back surgery. Context: The wound appeared gradually over time Modifying Factors: Patient is currently on renal dialysis and receives treatments 3 times  weekly Associated Signs and Symptoms: Patient reports having: surgery scheduled for this week for a AV fistula left arm. HPI Description: 59 year old patient who is known to be a diabetic and has end-stage renal disease has had several comorbidities including coronary artery disease, hypertension, hyperlipidemia, pancreatitis, anemia, previous history of hysterectomy, cholecystectomy, left-sided salivary gland excision, bilateral cataract surgery,Peritoneal dialysis catheter, hemodialysis catheter. the area on the back has also been caused by instant pressure she used to sleep on a recliner all day and has significant kyphoscoliosis. As far as the wound on her right lower extremity she's not sure how this blister occurred but she thought it has been there for about 2 weeks. No recent blood investigations available and no recent hemoglobin A1c. 12/30/2014 -- she is an assisted living facility but I believe the nurses that have not followed instructions as she had some cream applied on her back and there was a different dressing. Last week she's had a AV fistula placed on her left forearm. 01/13/2015 -- she has had some localized infection at the port site and she's been on doxycycline for this. 02/05/2015 - he has developed a small blister on her right lower extremity. 02/10/2015 -- she has developed another small blister on her right anterior chest wall in the area where she's had tape for her dialysis access. this may just be injury caused by a tape burn. 02/18/2015 -- no new blisters and she had a dermatology opinion and they have taken a biopsy of her skin. She also had a left brachial AV fistula placed this week. Electronic Signature(s) Signed: 02/19/2015 10:32:06 AM By: [name], [name] Entered By: [name] on 02/19/2015 10:32:05 Hyle, [name] ([id number]) [name], [name] ([id number]) -------------------------------------------------------------------------------- CHEM CAUT  GRANULATION TISS Details [name], [name]. Date of Service: 02/19/2015 10:15 AM Medical Record Patient Account Number: [id number] Number: [name], RN, [name],  Treating RN: 1955-09-11 (59 y.o. [name] Date of Birth/Sex: Female) Other Clinician: Primary Care Physician: Steele Sizer Treating [name] Referring Physician: Steele Sizer Physician/Extender: Suella Grove in Treatment: 8 Procedure Performed for: Wound #1 Right Back Performed By: Physician Pat Patrick., MD Notes The wound is clean and has some hypergranulation tissue and because of this I have used silver nitrate to help control this. Electronic Signature(s) Signed: 02/19/2015 10:31:01 AM By: [name], [name] Entered By: [name] on 02/19/2015 10:31:00 [name], [name] ([id number]) -------------------------------------------------------------------------------- Physical Exam Details [name], [name] Date of Service: 02/19/2015 10:15 AM Medical Record Patient Account Number: [id number] Number: [name], RN, [name], Treating RN: Feb 08, 1956 (59 y.o. [name] Date of Birth/Sex: Female) Other Clinician: Primary Care Physician: Steele Sizer Treating [name] Referring Physician: Steele Sizer Physician/Extender: Suella Grove in Treatment: 8 Constitutional . Pulse regular. Respirations normal and unlabored. Afebrile. . Eyes Nonicteric. Reactive to light. Ears, Nose, Mouth, and Throat Lips, teeth, and gums WNL.Marland Kitchen Moist mucosa without lesions . Neck supple and nontender. No palpable supraclavicular or cervical adenopathy. Normal sized without goiter. Respiratory WNL. No retractions.. Cardiovascular Pedal Pulses WNL. No clubbing, cyanosis or edema. Lymphatic No adneopathy. No adenopathy. No adenopathy. Musculoskeletal Adexa without tenderness or enlargement.. Digits and nails w/o clubbing, cyanosis, infection, petechiae, ischemia, or inflammatory conditions.. Integumentary  (Hair, Skin) No suspicious lesions. No crepitus or fluctuance. No peri-wound warmth or erythema. No masses.Marland Kitchen Psychiatric Judgement and insight Intact.. No evidence of depression, anxiety, or agitation.. Notes the one on the right lower extremity has completely healed and the one in the chest has gone down completely. The ulceration on the back has now filled out with healthy granulation tissue and in some areas there is hyper granulation and this will need some silver nitrate cauterization. Electronic Signature(s) Signed: 02/19/2015 10:33:01 AM By: [name], [name] Entered By: [name] on 02/19/2015 10:33:01 Levett, [name] ([id number]) -------------------------------------------------------------------------------- Physician Orders Details [name]. Date of Service: 02/19/2015 10:15 AM Medical Record Patient Account Number: [id number] Number: [name], RN, [name], Treating RN: 09/22/1955 (59 y.o. [name] Date of Birth/Sex: Female) Other Clinician: Primary Care Physician: Steele Sizer Treating [name] Referring Physician: Steele Sizer Physician/Extender: Suella Grove in Treatment: 8 Verbal / Phone Orders: Yes Clinician: [name], RN, [name], [name] Read Back and Verified: Yes Diagnosis Coding Wound Cleansing Wound #1 Right Back o Clean wound with Normal Saline. Skin Barriers/Peri-Wound Care Wound #1 Right Back o Skin Prep Primary Wound Dressing Wound #1 Right Back o Drawtex o Mepitel One Secondary Dressing Wound #1 Right Back o Boardered Foam Dressing Dressing Change Frequency Wound #1 Right Back o Change dressing every week Follow-up Appointments Wound #1 Right Back o Return Appointment in 1 week. Electronic Signature(s) Signed: 02/19/2015 10:26:03 AM By: Regan Lemming [name], RN Signed: 02/19/2015 12:28:06 PM By: [name], [name] Entered By: Regan Lemming on 02/19/2015 10:26:03 Broxton, [name]  ([id number]) -------------------------------------------------------------------------------- Problem List Details Patient Name: TIWANA, CHAVIS. Date of Service: 02/19/2015 10:15 AM Medical Record Patient Account Number: [id number] Number: [name], RN, [name], Treating RN: 04-11-1956 (59 y.o. 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 Date of Service: 02/19/2015 10:15 AM Medical Record Patient Account Number: [id number] Number: [name], RN, [name], Treating RN: [birth year] (59 y.o. [name] Date of Birth/Sex: Female) Other Clinician: Primary Care Physician: Steele Sizer Treating [name] Referring Physician: Steele Sizer Physician/Extender: Suella Grove in Treatment: 8 Subjective Chief Complaint Information obtained from Patient Patient presents to the wound care center for a consult due non healing wound. She has an open wound on her right upper back which she's had for about a year and she recently noticed a blister on her right lower extremity about 2 weeks ago. History of Present Illness (HPI) The following HPI elements were documented for the patient's wound: Location:  right upper back and right lower extremity wounds Quality: Patient reports No Pain. Severity: Patient states wound (s) are getting better. Duration: Patient has had the wound for > 3 months prior to seeking treatment at the wound center Timing: she thought it first occurred when she was using a heating pad about a year ago after back surgery. Context: The wound appeared gradually over time Modifying Factors: Patient is currently on renal dialysis and receives treatments 3 times weekly Associated Signs and Symptoms: Patient reports having: surgery scheduled for this week for a AV fistula left arm. 59 year old patient who is known to be a diabetic and has end-stage renal disease has had several comorbidities including coronary artery disease, hypertension, hyperlipidemia, pancreatitis, anemia, previous history of hysterectomy, cholecystectomy, left-sided salivary gland excision, bilateral cataract surgery,Peritoneal dialysis catheter, hemodialysis catheter. the area on the back has also been caused by instant pressure she used to sleep on a recliner all day and has significant kyphoscoliosis. As far as the wound on her right lower extremity she's not sure how this blister occurred but she thought it has been there for about 2 weeks. No recent blood investigations available and no recent hemoglobin A1c. 12/30/2014 -- she is an assisted living facility but I believe the nurses that have not followed instructions as she had some cream applied on her back and there was a different dressing. Last week she's had a AV fistula placed on her left forearm. 01/13/2015 -- she has had some localized infection at the port site and she's been on doxycycline for this. 02/05/2015 - he has developed a small blister on her right lower extremity. 02/10/2015 -- she has developed another small blister on her right anterior chest wall in the area where she's had tape for her dialysis access. this may just be injury  caused by a tape burn. KARNA, [name] ([id number]) 02/18/2015 -- no new blisters and she had a dermatology opinion and they have taken a biopsy of her skin. She also had a left brachial AV fistula placed this week. Objective Constitutional Pulse regular. Respirations normal and unlabored. Afebrile. Vitals Time Taken: 10:03 AM, Height: 65 in, Weight: 156 lbs, BMI: 26, Temperature: 98.1 F, Pulse: 70 bpm, Respiratory Rate: 18 breaths/min, Blood Pressure: 140/76 mmHg. Eyes Nonicteric. Reactive to light. Ears, Nose, Mouth, and Throat Lips, teeth, and gums WNL.Marland Kitchen Moist mucosa without lesions . Neck supple and nontender. No palpable supraclavicular or cervical adenopathy. Normal sized without goiter. Respiratory WNL. No retractions.. Cardiovascular Pedal Pulses WNL. No clubbing, cyanosis or edema. Lymphatic No adneopathy. No adenopathy. No adenopathy. Musculoskeletal Adexa without tenderness or enlargement.Marland Kitchen  Digits and nails w/o clubbing, cyanosis, infection, petechiae, ischemia, or inflammatory conditions.Marland Kitchen Psychiatric Judgement and insight Intact.. No evidence of depression, anxiety, or agitation.. General Notes: the one on the right lower extremity has completely healed and the one in the chest has gone down completely. The ulceration on the back has now filled out with healthy granulation tissue and in some areas there is hyper granulation and this will need some silver nitrate cauterization. Integumentary (Hair, Skin) No suspicious lesions. No crepitus or fluctuance. No peri-wound warmth or erythema. No masses.[name] ([id number]) Wound #1 status is Open. Original cause of wound was Blister. The wound is located on the Right Back. The wound measures 4cm length x 6cm width x 0.1cm depth; 18.85cm^2 area and 1.885cm^3 volume. The wound is limited to skin breakdown. There is no tunneling or undermining noted. There is a large amount of serosanguineous drainage noted. The wound  margin is distinct with the outline attached to the wound base. There is large (67-100%) red, pink, friable granulation within the wound bed. There is no necrotic tissue within the wound bed. The periwound skin appearance exhibited: Moist. The periwound skin appearance did not exhibit: Callus, Crepitus, Excoriation, Fluctuance, Friable, Induration, Localized Edema, Rash, Scarring, Dry/Scaly, Maceration, Atrophie Blanche, Cyanosis, Ecchymosis, Hemosiderin Staining, Mottled, Pallor, Rubor, Erythema. Periwound temperature was noted as No Abnormality. Wound #3 status is Open. Original cause of wound was Blister. The wound is located on the Right,Midline Lower Leg. The wound measures 0cm length x 0cm width x 0cm depth; 0cm^2 area and 0cm^3 volume. The wound is limited to skin breakdown. There is no tunneling or undermining noted. There is a none present amount of drainage noted. The wound margin is indistinct and nonvisible. There is no granulation within the wound bed. There is no necrotic tissue within the wound bed. The periwound skin appearance exhibited: Dry/Scaly. The periwound skin appearance did not exhibit: Callus, Crepitus, Excoriation, Fluctuance, Friable, Induration, Localized Edema, Rash, Scarring, Maceration, Moist, Atrophie Blanche, Cyanosis, Ecchymosis, Hemosiderin Staining, Mottled, Pallor, Rubor, Erythema. Periwound temperature was noted as No Abnormality. Assessment Active Problems ICD-10 E11.622 - Type 2 diabetes mellitus with other skin ulcer L89.112 - Pressure ulcer of right upper back, stage 2 S80.821A - Blister (nonthermal), right lower leg, initial encounter N18.6 - End stage renal disease After chemically cauterizing the back wound, I am going to recommend some Mepitel some pressure dressing with gauze and the daughter asked if needed and a bordered foam dressing over this. This can be left in place all week if possible. Procedures Wound #1 Wound #1 is a Pressure Ulcer  located on the Right Back . An CHEM CAUT GRANULATION TISS procedure was performed by Mahreen Schewe, Jackson Latino., MD. Notes: The wound is clean and has some Farrugia, [name] ([id number]) hypergranulation tissue and because of this I have used silver nitrate to help control this. Plan Wound Cleansing: Wound #1 Right Back: Clean wound with Normal Saline. Skin Barriers/Peri-Wound Care: Wound #1 Right Back: Skin Prep Primary Wound Dressing: Wound #1 Right Back: Drawtex Mepitel One Secondary Dressing: Wound #1 Right Back: Boardered Foam Dressing Dressing Change Frequency: Wound #1 Right Back: Change dressing every week Follow-up Appointments: Wound #1 Right Back: Return Appointment in 1 week. After chemically cauterizing the back wound, I am going to recommend some Mepitel some pressure dressing with gauze and the daughter asked if needed and a bordered foam dressing over this. This can be left in place all week if possible. Electronic Signature(s) Signed: 02/19/2015 10:33:53  AM By: [name], [name] Entered By: [name] on 02/19/2015 10:33:52 [name], [name] ([id number]) -------------------------------------------------------------------------------- SuperBill Details [name], [name] Date of Service: 02/19/2015 Medical Record Patient Account Number: [id number] Number: [name], RN, [name], Treating RN: 1956-04-22 (59 y.o. [name] Date of Birth/Sex: Female) Other Clinician: Primary Care Physician: Steele Sizer Treating [name] Referring Physician: Steele Sizer Physician/Extender: Suella Grove in Treatment: 8 Diagnosis Coding ICD-10 Codes Code Description E11.622 Type 2 diabetes mellitus with other skin ulcer L89.112 Pressure ulcer of right upper back, stage 2 S80.821A Blister (nonthermal), right lower leg, initial encounter N18.6 End stage renal disease Facility Procedures CPT4 Code: [id number] Description: 03833 - CHEM CAUT GRANULATION TISS ICD-10  Description Diagnosis E11.622 Type 2 diabetes mellitus with other skin ulcer L89.112 Pressure ulcer of right upper back, stage 2 S80.821A Blister (nonthermal), right lower leg, initial enco Modifier: unter Quantity: 1 Physician Procedures CPT4 Code: [id number] Description: 38329 - WC PHYS CHEM CAUT GRAN TISSUE ICD-10 Description Diagnosis E11.622 Type 2 diabetes mellitus with other skin ulcer L89.112 Pressure ulcer of right upper back, stage 2 S80.821A Blister (nonthermal), right lower leg, initial enco Modifier: unter Quantity: 1 Electronic Signature(s) Signed: 02/19/2015 10:34:07 AM By: [name], [name] Entered By: [name] on 02/19/2015 10:34:06

## 2015-02-19 NOTE — Progress Notes (Signed)
[name] ([id number]) Visit Report for 02/19/2015 Arrival Information Details [name], [name]. Date of Service: 02/19/2015 10:15 AM Medical Record Number: [id number] Patient Account Number: [id number] Date of Birth/Sex: 1956/02/24 (59 y.o. Female) Treating RN: Baruch Gouty, RN, [name], [name] Primary Care Physician: Steele Sizer Other Clinician: Referring Physician: Steele Sizer Treating Physician/Extender: Frann Rider in Treatment: 8 Visit Information History Since Last Visit Any new allergies or adverse reactions: No Patient Arrived: [name] Had a fall or experienced change in No Arrival Time: 10:02 activities of daily living that may affect Accompanied By: self risk of falls: Transfer Assistance: None Signs or symptoms of abuse/neglect since last No Patient Identification Verified: Yes visito Secondary Verification Process Yes Hospitalized since last visit: No Completed: Has Dressing in Place as Prescribed: Yes Patient Requires Transmission- No Pain Present Now: No Based Precautions: Patient Has Alerts: Yes Patient Alerts: Patient on Blood Thinner ABI R: 1.28 Electronic Signature(s) Signed: 02/19/2015 3:16:53 PM By: Regan Lemming [name], RN Entered By: Regan Lemming on 02/19/2015 10:03:14 [name] ([id number]) -------------------------------------------------------------------------------- Encounter Discharge Information Details [name]. Date of Service: 02/19/2015 10:15 AM Medical Record Number: [id number] Patient Account Number: [id number] Date of Birth/Sex: [birth year] (59 y.o. Female) Treating RN: Baruch Gouty, RN, [name], [name] Primary Care Physician: Steele Sizer Other Clinician: Referring Physician: Steele Sizer Treating Physician/Extender: Frann Rider in Treatment: 8 Encounter Discharge Information Items Schedule Follow-up Appointment: No Medication Reconciliation completed No and provided to Patient/Care  Linken Mcglothen: Provided on Clinical Summary of Care: 02/19/2015 Form Type Recipient Paper Patient LD Electronic Signature(s) Signed: 02/19/2015 10:26:19 AM By: Ruthine Dose Entered By: Ruthine Dose on 02/19/2015 10:26:18 [name] ([id number]) -------------------------------------------------------------------------------- Lower Extremity Assessment Details Patient Name: [name], [name] Date of Service: 02/19/2015 10:15 AM Medical Record Number: [id number] Patient Account Number: [id number] Date of Birth/Sex: [birth year] (59 y.o. Female) Treating RN: Baruch Gouty, RN, [name], [name] Primary Care Physician: Steele Sizer Other Clinician: Referring Physician: Steele Sizer Treating Physician/Extender: Frann Rider in Treatment: 8 Edema Assessment Assessed: Shirlyn Goltz: No] [Right: No] E[Left: dema] [Right: :] Calf Left: Right: Point of Measurement: 30 cm From Medial Instep cm 31.3 cm Ankle Left: Right: Point of Measurement: 11 cm From Medial Instep cm 20.2 cm Vascular Assessment Claudication: Claudication Assessment [Right:None] Pulses: Posterior Tibial Dorsalis Pedis Palpable: [Right:Yes] Extremity colors, hair growth, and conditions: Extremity Color: [Right:Normal] Hair Growth on Extremity: [Right:No] Temperature of Extremity: [Right:Warm] Capillary Refill: [Right:< 3 seconds] Electronic Signature(s) Signed: 02/19/2015 3:16:53 PM By: Regan Lemming [name], RN Entered By: Regan Lemming on 02/19/2015 10:08:37 [name] ([id number]) -------------------------------------------------------------------------------- Multi Wound Chart Details Patient Name: [name], [name] Date of Service: 02/19/2015 10:15 AM Medical Record Number: [id number] Patient Account Number: [id number] Date of Birth/Sex: 21-Aug-1955 (59 y.o. Female) Treating RN: Baruch Gouty, RN, [name], [name] Primary Care Physician: Steele Sizer Other Clinician: Referring Physician: Steele Sizer Treating Physician/Extender: Frann Rider in Treatment: 8 Vital Signs Height(in): 65 Pulse(bpm): 70 Weight(lbs): 156 Blood Pressure 140/76 (mmHg): Body Mass Index(BMI): 26 Temperature(F): 98.1 Respiratory Rate 18 (breaths/min): Photos: [1:No Photos] [3:No Photos] [N/A:N/A] Wound Location: [1:Right Back] [3:Right Lower Leg - Midline N/A] Wounding Event: [1:Blister] [3:Blister] [N/A:N/A] Primary Etiology: [1:Pressure Ulcer] [3:Venous Leg Ulcer] [N/A:N/A] Comorbid History: [1:Glaucoma, Anemia, Coronary Artery Disease, Coronary Artery Disease, Hypertension, Type II Diabetes, End Stage Renal Disease, Osteoarthritis, Neuropathy Osteoarthritis, Neuropathy] [3:Glaucoma, Anemia, Hypertension, Type II Diabetes,  End Stage Renal Disease,] [N/A:N/A] Date Acquired: [1:10/19/2013] [3:01/22/2015] [N/A:N/A] Weeks of Treatment: [1:8] [3:2] [N/A:N/A] Wound Status: [1:Open] [3:Open] [N/A:N/A] Measurements L  x W x D 4x6x0.1 [3:0x0x0] [N/A:N/A] (cm) Area (cm) : [1:18.85] [3:0] [N/A:N/A] Volume (cm) : [1:1.885] [3:0] [N/A:N/A] % Reduction in Area: [1:51.00%] [3:100.00%] [N/A:N/A] % Reduction in Volume: 51.00% [3:100.00%] [N/A:N/A] Classification: [1:Category/Stage II] [3:Unclassifiable] [N/A:N/A] HBO Classification: [1:N/A] [3:Grade 0] [N/A:N/A] Exudate Amount: [1:Large] [3:None Present] [N/A:N/A] Exudate Type: [1:Serosanguineous] [3:N/A] [N/A:N/A] Exudate Color: [1:red, brown] [3:N/A] [N/A:N/A] Wound Margin: [1:Distinct, outline attached Indistinct, nonvisible] [N/A:N/A] Granulation Amount: [1:Large (67-100%)] [3:None Present (0%)] [N/A:N/A] Granulation Quality: [1:Red, Pink, Friable] [3:N/A] [N/A:N/A] Necrotic Amount: [1:None Present (0%)] [3:None Present (0%)] [N/A:N/A] Exposed Structures: [1:Fascia: No Fat: No] [3:Fascia: No Fat: No] [N/A:N/A] Tendon: No Tendon: No Muscle: No Muscle: No Joint: No Joint: No Bone: No Bone: No Limited to Skin Limited to Skin Breakdown Breakdown Epithelialization: Medium (34-66%)  Large (67-100%) N/A Periwound Skin Texture: Edema: No Edema: No N/A Excoriation: No Excoriation: No Induration: No Induration: No Callus: No Callus: No Crepitus: No Crepitus: No Fluctuance: No Fluctuance: No Friable: No Friable: No Rash: No Rash: No Scarring: No Scarring: No Periwound Skin Moist: Yes Dry/Scaly: Yes N/A Moisture: Maceration: No Maceration: No Dry/Scaly: No Moist: No Periwound Skin Color: Atrophie Blanche: No Atrophie Blanche: No N/A Cyanosis: No Cyanosis: No Ecchymosis: No Ecchymosis: No Erythema: No Erythema: No Hemosiderin Staining: No Hemosiderin Staining: No Mottled: No Mottled: No Pallor: No Pallor: No Rubor: No Rubor: No Temperature: No Abnormality No Abnormality N/A Tenderness on No No N/A Palpation: Wound Preparation: Ulcer Cleansing: Ulcer Cleansing: N/A Rinsed/Irrigated with Rinsed/Irrigated with Saline Saline Topical Anesthetic Applied: None Procedures Performed: CHEM CAUT N/A N/A GRANULATION TISS Treatment Notes Electronic Signature(s) Signed: 02/19/2015 3:16:53 PM By: Regan Lemming [name], RN Entered By: Regan Lemming on 02/19/2015 10:16:37 Kasparek, [name] ([id number]) -------------------------------------------------------------------------------- [place] Details [name]. Date of Service: 02/19/2015 10:15 AM Medical Record Number: [id number] Patient Account Number: [id number] Date of Birth/Sex: 20-Jan-1956 (59 y.o. Female) Treating RN: Baruch Gouty, RN, [name], [name] Primary Care Physician: Steele Sizer Other Clinician: Referring Physician: Steele Sizer Treating Physician/Extender: Frann Rider in Treatment: 8 Active Inactive Abuse / Safety / Falls / Self Care Management Nursing Diagnoses: Potential for falls Goals: Patient will remain injury free Date Initiated: 12/23/2014 Goal Status: Active Patient/caregiver will verbalize understanding of skin care regimen Date Initiated:  12/23/2014 Goal Status: Active Patient/caregiver will verbalize/demonstrate measures taken to prevent injury and/or falls Date Initiated: 12/23/2014 Goal Status: Active Patient/caregiver will verbalize/demonstrate understanding of what to do in case of emergency Date Initiated: 12/23/2014 Goal Status: Active Interventions: Assess fall risk on admission and as needed Assess: immobility, friction, shearing, incontinence upon admission and as needed Assess impairment of mobility on admission and as needed per policy Assess self care needs on admission and as needed Provide education on fall prevention Notes: Wound/Skin Impairment Nursing Diagnoses: Impaired tissue integrity Knowledge deficit related to ulceration/compromised skin integrity Goals: Patient/caregiver will verbalize understanding of skin care regimen ELESE, RANE ([id number]) Date Initiated: 12/23/2014 Goal Status: Active Ulcer/skin breakdown will heal within 14 weeks Date Initiated: 12/23/2014 Goal Status: Active Interventions: Assess patient/caregiver ability to obtain necessary supplies Assess patient/caregiver ability to perform ulcer/skin care regimen upon admission and as needed Assess ulceration(s) every visit Provide education on ulcer and skin care Treatment Activities: Skin care regimen initiated : 02/19/2015 Topical wound management initiated : 02/19/2015 Notes: Electronic Signature(s) Signed: 02/19/2015 3:16:53 PM By: Regan Lemming [name], RN Entered By: Regan Lemming on 02/19/2015 10:16:29 Samons, [name] ([id number]) -------------------------------------------------------------------------------- Pain Assessment Details [name]. Date of Service: 02/19/2015 10:15  AM Medical Record Number: [id number] Patient Account Number: [id number] Date of Birth/Sex: March 18, 1956 (59 y.o. Female) Treating RN: Baruch Gouty, RN, [name], [name] Primary Care Physician: Steele Sizer Other Clinician: Referring Physician: Steele Sizer Treating Physician/Extender: Frann Rider in Treatment: 8 Active Problems Location of Pain Severity and Description of Pain Patient Has Paino No Site Locations Pain Management and Medication Current Pain Management: Electronic Signature(s) Signed: 02/19/2015 3:16:53 PM By: Regan Lemming [name], RN Entered By: Regan Lemming on 02/19/2015 10:03:21 [name], [name] ([id number]) -------------------------------------------------------------------------------- Wound Assessment Details Patient Name: Mahurin, [name] Date of Service: 02/19/2015 10:15 AM Medical Record Number: [id number] Patient Account Number: [id number] Date of Birth/Sex: [birth year] (59 y.o. Female) Treating RN: [name], RN, [name], [place] Primary Care Physician: Steele Sizer Other Clinician: Referring Physician: Steele Sizer Treating Physician/Extender: Frann Rider in Treatment: 8 Wound Status Wound Number: 1 Primary Pressure Ulcer Etiology: Wound Location: Right Back Wound Open Wounding Event: Blister Status: Date Acquired: 10/19/2013 Comorbid Glaucoma, Anemia, Coronary Artery Weeks Of Treatment: 8 History: Disease, Hypertension, Type II Clustered Wound: No Diabetes, End Stage Renal Disease, Osteoarthritis, Neuropathy Photos Photo Uploaded By: Regan Lemming on 02/19/2015 15:07:04 Wound Measurements Length: (cm) 4 Width: (cm) 6 Depth: (cm) 0.1 Area: (cm) 18.85 Volume: (cm) 1.885 % Reduction in Area: 51% % Reduction in Volume: 51% Epithelialization: Medium (34-66%) Tunneling: No Undermining: No Wound Description Classification: Category/Stage II Wound Margin: Distinct, outline attached Exudate Amount: Large Exudate Type: Serosanguineous Exudate Color: red, brown Foul Odor After Cleansing: No Wound Bed Granulation Amount: Large (67-100%) Exposed Structure Granulation Quality: Red, Pink, Friable Fascia Exposed: No Necrotic Amount: None Present (0%) Fat Layer Exposed: No Navejas, [name]  ([id number]) Tendon Exposed: No Muscle Exposed: No Joint Exposed: No Bone Exposed: No Limited to Skin Breakdown Periwound Skin Texture Texture Color No Abnormalities Noted: No No Abnormalities Noted: No Callus: No Atrophie Blanche: No Crepitus: No Cyanosis: No Excoriation: No Ecchymosis: No Fluctuance: No Erythema: No Friable: No Hemosiderin Staining: No Induration: No Mottled: No Localized Edema: No Pallor: No Rash: No Rubor: No Scarring: No Temperature / Pain Moisture Temperature: No Abnormality No Abnormalities Noted: No Dry / Scaly: No Maceration: No Moist: Yes Wound Preparation Ulcer Cleansing: Rinsed/Irrigated with Saline Topical Anesthetic Applied: None Electronic Signature(s) Signed: 02/19/2015 3:16:53 PM By: Regan Lemming [name], RN Entered By: Regan Lemming on 02/19/2015 10:09:23 Want, [name] ([id number]) -------------------------------------------------------------------------------- Wound Assessment Details Patient Name: Freer, [name] Date of Service: 02/19/2015 10:15 AM Medical Record Number: [id number] Patient Account Number: [id number] Date of Birth/Sex: May 26, 1956 (59 y.o. Female) Treating RN: [name], RN, [name], [place] Primary Care Physician: Steele Sizer Other Clinician: Referring Physician: Steele Sizer Treating Physician/Extender: Frann Rider in Treatment: 8 Wound Status Wound Number: 3 Primary Venous Leg Ulcer Etiology: Wound Location: Right Lower Leg - Midline Wound Open Wounding Event: Blister Status: Date Acquired: 01/22/2015 Comorbid Glaucoma, Anemia, Coronary Artery Weeks Of Treatment: 2 History: Disease, Hypertension, Type II Clustered Wound: No Diabetes, End Stage Renal Disease, Osteoarthritis, Neuropathy Photos Photo Uploaded By: Regan Lemming on 02/19/2015 15:07:05 Wound Measurements Length: (cm) 0 % Reduct Width: (cm) 0 % Reduct Depth: (cm) 0 Epitheli Area: (cm) 0 Tunneli Volume: (cm) 0 Undermi ion in Area:  100% ion in Volume: 100% alization: Large (67-100%) ng: No ning: No Wound Description Classification: Unclassifiable Diabetic Severity [name]): Grade 0 Wound Margin: Indistinct, nonvisib Exudate Amount: None Present Foul Odor After Cleansing: No le Wound Bed Granulation Amount: None Present (0%) Exposed Structure Necrotic Amount: None Present (0%) Fascia Exposed: No Fat Layer  Exposed: No Tendon Exposed: No Capili, [name] ([id number]) Muscle Exposed: No Joint Exposed: No Bone Exposed: No Limited to Skin Breakdown Periwound Skin Texture Texture Color No Abnormalities Noted: No No Abnormalities Noted: No Callus: No Atrophie Blanche: No Crepitus: No Cyanosis: No Excoriation: No Ecchymosis: No Fluctuance: No Erythema: No Friable: No Hemosiderin Staining: No Induration: No Mottled: No Localized Edema: No Pallor: No Rash: No Rubor: No Scarring: No Temperature / Pain Moisture Temperature: No Abnormality No Abnormalities Noted: No Dry / Scaly: Yes Maceration: No Moist: No Wound Preparation Ulcer Cleansing: Rinsed/Irrigated with Saline Electronic Signature(s) Signed: 02/19/2015 3:16:53 PM By: Regan Lemming [name], RN Entered By: Regan Lemming on 02/19/2015 10:09:43 Senne, [name] ([id number]) -------------------------------------------------------------------------------- Vitals Details [name]. Date of Service: 02/19/2015 10:15 AM Medical Record Number: [id number] Patient Account Number: [id number] Date of Birth/Sex: [birth year] (59 y.o. Female) Treating RN: [name], RN, [name], [place] Primary Care Physician: Steele Sizer Other Clinician: Referring Physician: Steele Sizer Treating Physician/Extender: Frann Rider in Treatment: 8 Vital Signs Time Taken: 10:03 Temperature (F): 98.1 Height (in): 65 Pulse (bpm): 70 Weight (lbs): 156 Respiratory Rate (breaths/min): 18 Body Mass Index (BMI): 26 Blood Pressure (mmHg): 140/76 Reference Range:  80 - 120 mg / dl Electronic Signature(s) Signed: 02/19/2015 3:16:53 PM By: Regan Lemming [name], RN Entered By: Regan Lemming on 02/19/2015 10:07:59

## 2015-02-24 ENCOUNTER — Encounter: Payer: Medicare Other | Admitting: Surgery

## 2015-02-24 DIAGNOSIS — L89112 Pressure ulcer of right upper back, stage 2: Secondary | ICD-10-CM | POA: Diagnosis not present

## 2015-02-24 NOTE — Progress Notes (Signed)
[name], [name] ([id number]) Visit Report for 02/24/2015 Arrival Information Details [name], [name]. Date of Service: 02/24/2015 9:30 AM Medical Record Number: [id number] Patient Account Number: [id number] Date of Birth/Sex: 08/27/1955 (59 y.o. Female) Treating RN: Baruch Gouty, RN, [name], [name] Primary Care Physician: Steele Sizer Other Clinician: Referring Physician: Steele Sizer Treating Physician/Extender: Frann Rider in Treatment: 9 Visit Information History Since Last Visit Any new allergies or adverse reactions: No Patient Arrived: [name] Had a fall or experienced change in No Arrival Time: 09:21 activities of daily living that may affect Accompanied By: self risk of falls: Transfer Assistance: None Signs or symptoms of abuse/neglect since last No Patient Identification Verified: Yes visito Secondary Verification Process Yes Hospitalized since last visit: No Completed: Has Dressing in Place as Prescribed: Yes Patient Requires Transmission- No Pain Present Now: No Based Precautions: Patient Has Alerts: Yes Patient Alerts: Patient on Blood Thinner ABI R: 1.28 Electronic Signature(s) Signed: 02/24/2015 9:21:38 AM By: Regan Lemming [name], RN Entered By: Regan Lemming on 02/24/2015 09:21:38 [name], [name] -------------------------------------------------------------------------------- Clinic Level of Care Assessment Details [name]. Date of Service: 02/24/2015 9:30 AM Medical Record Number: [id number] Patient Account Number: [id number] Date of Birth/Sex: 1955/10/29 (59 y.o. Female) Treating RN: [name], RN, [name], [place] Primary Care Physician: Steele Sizer Other Clinician: Referring Physician: Steele Sizer Treating Physician/Extender: Frann Rider in Treatment: 9 Clinic Level of Care Assessment Items TOOL 4 Quantity Score []  - Use when only an EandM is performed on FOLLOW-UP visit 0 ASSESSMENTS - Nursing Assessment /  Reassessment X - Reassessment of Co-morbidities (includes updates in patient status) 1 10 X - Reassessment of Adherence to Treatment Plan 1 5 ASSESSMENTS - Wound and Skin Assessment / Reassessment X - Simple Wound Assessment / Reassessment - one wound 1 5 []  - Complex Wound Assessment / Reassessment - multiple wounds 0 []  - Dermatologic / Skin Assessment (not related to wound area) 0 ASSESSMENTS - Focused Assessment []  - Circumferential Edema Measurements - multi extremities 0 []  - Nutritional Assessment / Counseling / Intervention 0 []  - Lower Extremity Assessment (monofilament, tuning fork, pulses) 0 []  - Peripheral Arterial Disease Assessment (using hand held doppler) 0 ASSESSMENTS - Ostomy and/or Continence Assessment and Care []  - Incontinence Assessment and Management 0 []  - Ostomy Care Assessment and Management (repouching, etc.) 0 PROCESS - Coordination of Care X - Simple Patient / Family Education for ongoing care 1 15 []  - Complex (extensive) Patient / Family Education for ongoing care 0 []  - Staff obtains Programmer, systems, Records, Test Results / Process Orders 0 []  - Staff telephones HHA, Nursing Homes / Clarify orders / etc 0 []  - Routine Transfer to another Facility (non-emergent condition) 0 [name], [name] []  - Routine Hospital Admission (non-emergent condition) 0 []  - New Admissions / Biomedical engineer / Ordering NPWT, Apligraf, etc. 0 []  - Emergency Hospital Admission (emergent condition) 0 []  - Simple Discharge Coordination 0 []  - Complex (extensive) Discharge Coordination 0 PROCESS - Special Needs []  - Pediatric / Minor Patient Management 0 []  - Isolation Patient Management 0 []  - Hearing / Language / Visual special needs 0 []  - Assessment of Community assistance (transportation, D/C planning, etc.) 0 []  - Additional assistance / Altered mentation 0 []  - Support Surface(s) Assessment (bed, cushion, seat, etc.) 0 INTERVENTIONS - Wound Cleansing /  Measurement X - Simple Wound Cleansing - one wound 1 5 []  - Complex Wound Cleansing - multiple wounds 0 X - Wound Imaging (photographs -  any number of wounds) 1 5 []  - Wound Tracing (instead of photographs) 0 []  - Simple Wound Measurement - one wound 0 []  - Complex Wound Measurement - multiple wounds 0 INTERVENTIONS - Wound Dressings X - Small Wound Dressing one or multiple wounds 1 10 []  - Medium Wound Dressing one or multiple wounds 0 []  - Large Wound Dressing one or multiple wounds 0 []  - Application of Medications - topical 0 []  - Application of Medications - injection 0 INTERVENTIONS - Miscellaneous []  - External ear exam 0 [name], [name] []  - Specimen Collection (cultures, biopsies, blood, body fluids, etc.) 0 []  - Specimen(s) / Culture(s) sent or taken to Lab for analysis 0 []  - Patient Transfer (multiple staff / Harrel Lemon Lift / Similar devices) 0 []  - Simple Staple / Suture removal (25 or less) 0 []  - Complex Staple / Suture removal (26 or more) 0 []  - Hypo / Hyperglycemic Management (close monitor of Blood Glucose) 0 []  - Ankle / Brachial Index (ABI) - do not check if billed separately 0 X - Vital Signs 1 5 Has the patient been seen at the hospital within the last three years: Yes Total Score: 60 Level Of Care: New/Established - Level 2 Electronic Signature(s) Signed: 02/24/2015 9:47:59 AM By: Regan Lemming [name], RN Entered By: Regan Lemming on 02/24/2015 09:47:59 [name], [name] -------------------------------------------------------------------------------- Encounter Discharge Information Details Patient Name: [name], [name] Date of Service: 02/24/2015 9:30 AM Medical Record Number: [id number] Patient Account Number: [id number] Date of Birth/Sex: 08-Dec-1955 (59 y.o. Female) Treating RN: Baruch Gouty, RN, [name], [name] Primary Care Physician: Steele Sizer Other Clinician: Referring Physician: Steele Sizer Treating Physician/Extender: Frann Rider in  Treatment: 9 Encounter Discharge Information Items Discharge Pain Level: 0 Discharge Condition: Stable Ambulatory Status: [place] Discharge Destination: Home Transportation: Other Accompanied By: self Schedule Follow-up Appointment: No Medication Reconciliation completed No and provided to Patient/Care Taimur Fier: Provided on Clinical Summary of Care: 02/24/2015 Form Type Recipient Paper Patient LD Electronic Signature(s) Signed: 02/24/2015 9:49:58 AM By: Ruthine Dose Previous Signature: 02/24/2015 9:49:06 AM Version By: Regan Lemming [name], RN Entered By: Ruthine Dose on 02/24/2015 09:49:58 [name], [name] ([id number]) -------------------------------------------------------------------------------- Lower Extremity Assessment Details [name], [name] Date of Service: 02/24/2015 9:30 AM Medical Record Number: [id number] Patient Account Number: [id number] Date of Birth/Sex: 1956/02/28 (59 y.o. Female) Treating RN: [name], RN, [name], [name] Primary Care Physician: Steele Sizer Other Clinician: Referring Physician: Steele Sizer Treating Physician/Extender: Frann Rider in Treatment: 9 Electronic Signature(s) Signed: 02/24/2015 9:29:48 AM By: Regan Lemming [name], RN Entered By: Regan Lemming on 02/24/2015 09:29:48 Norman, [name] ([id number]) -------------------------------------------------------------------------------- Multi Wound Chart Details [name], [name] Date of Service: 02/24/2015 9:30 AM Medical Record Number: [id number] Patient Account Number: [id number] Date of Birth/Sex: 1956/01/10 (59 y.o. Female) Treating RN: Baruch Gouty, RN, [name], [name] Primary Care Physician: Steele Sizer Other Clinician: Referring Physician: Steele Sizer Treating Physician/Extender: Frann Rider in Treatment: 9 Vital Signs Height(in): 65 Pulse(bpm): 70 Weight(lbs): 156 Blood Pressure 128/72 (mmHg): Body Mass Index(BMI): 26 Temperature(F): 98.1 Respiratory  Rate 18 (breaths/min): Photos: [1:No Photos] [N/A:N/A] Wound Location: [1:Right Back] [N/A:N/A] Wounding Event: [1:Blister] [N/A:N/A] Primary Etiology: [1:Pressure Ulcer] [N/A:N/A] Comorbid History: [1:Glaucoma, Anemia, Coronary Artery Disease, Hypertension, Type II Diabetes, End Stage Renal Disease, Osteoarthritis, Neuropathy] [N/A:N/A] Date Acquired: [1:10/19/2013] [N/A:N/A] Weeks of Treatment: [1:9] [N/A:N/A] Wound Status: [1:Open] [N/A:N/A] Measurements L x W x D 3.5x6x0.1 [N/A:N/A] (cm) Area (cm) : [1:16.493] [N/A:N/A] Volume (cm) : [1:1.649] [N/A:N/A] % Reduction in Area: [  1:57.10%] [N/A:N/A] % Reduction in Volume: 57.10% [N/A:N/A] Classification: [1:Category/Stage II] [N/A:N/A] Exudate Amount: [1:Large] [N/A:N/A] Exudate Type: [1:Serosanguineous] [N/A:N/A] Exudate Color: [1:red, brown] [N/A:N/A] Wound Margin: [1:Distinct, outline attached N/A] Granulation Amount: [1:Large (67-100%)] [N/A:N/A] Granulation Quality: [1:Red, Pink, Friable] [N/A:N/A] Necrotic Amount: [1:None Present (0%)] [N/A:N/A] Exposed Structures: [1:Fascia: No Fat: No Tendon: No] [N/A:N/A] Muscle: No Joint: No Bone: No Limited to Skin Breakdown Epithelialization: Medium (34-66%) N/A N/A Periwound Skin Texture: Edema: No N/A N/A Excoriation: No Induration: No Callus: No Crepitus: No Fluctuance: No Friable: No Rash: No Scarring: No Periwound Skin Moist: Yes N/A N/A Moisture: Maceration: No Dry/Scaly: No Periwound Skin Color: Atrophie Blanche: No N/A N/A Cyanosis: No Ecchymosis: No Erythema: No Hemosiderin Staining: No Mottled: No Pallor: No Rubor: No Temperature: No Abnormality N/A N/A Tenderness on No N/A N/A Palpation: Wound Preparation: Ulcer Cleansing: N/A N/A Rinsed/Irrigated with Saline Topical Anesthetic Applied: None Treatment Notes Electronic Signature(s) Signed: 02/24/2015 9:35:41 AM By: Regan Lemming [name], RN Entered By: Regan Lemming on 02/24/2015 09:35:41 [place], [name]  ([id number]) -------------------------------------------------------------------------------- [place] Details [name]. Date of Service: 02/24/2015 9:30 AM Medical Record Number: [id number] Patient Account Number: [id number] Date of Birth/Sex: 1955/09/12 (59 y.o. Female) Treating RN: Baruch Gouty, RN, [name], [name] Primary Care Physician: Steele Sizer Other Clinician: Referring Physician: Steele Sizer Treating Physician/Extender: Frann Rider in Treatment: 9 Active Inactive Abuse / Safety / Falls / Self Care Management Nursing Diagnoses: Potential for falls Goals: Patient will remain injury free Date Initiated: 12/23/2014 Goal Status: Active Patient/caregiver will verbalize understanding of skin care regimen Date Initiated: 12/23/2014 Goal Status: Active Patient/caregiver will verbalize/demonstrate measures taken to prevent injury and/or falls Date Initiated: 12/23/2014 Goal Status: Active Patient/caregiver will verbalize/demonstrate understanding of what to do in case of emergency Date Initiated: 12/23/2014 Goal Status: Active Interventions: Assess fall risk on admission and as needed Assess: immobility, friction, shearing, incontinence upon admission and as needed Assess impairment of mobility on admission and as needed per policy Assess self care needs on admission and as needed Provide education on fall prevention Notes: Wound/Skin Impairment Nursing Diagnoses: Impaired tissue integrity Knowledge deficit related to ulceration/compromised skin integrity Goals: Patient/caregiver will verbalize understanding of skin care regimen [name], [name] ([id number]) Date Initiated: 12/23/2014 Goal Status: Active Ulcer/skin breakdown will heal within 14 weeks Date Initiated: 12/23/2014 Goal Status: Active Interventions: Assess patient/caregiver ability to obtain necessary supplies Assess patient/caregiver ability to perform ulcer/skin care regimen  upon admission and as needed Assess ulceration(s) every visit Provide education on ulcer and skin care Treatment Activities: Skin care regimen initiated : 02/24/2015 Topical wound management initiated : 02/24/2015 Notes: Electronic Signature(s) Signed: 02/24/2015 9:35:27 AM By: Regan Lemming [name], RN Entered By: Regan Lemming on 02/24/2015 09:35:27 [name], [name] ([id number]) -------------------------------------------------------------------------------- Pain Assessment Details [name]. Date of Service: 02/24/2015 9:30 AM Medical Record Number: [id number] Patient Account Number: [id number] Date of Birth/Sex: 07-08-1956 (59 y.o. Female) Treating RN: Baruch Gouty, RN, [name], [name] Primary Care Physician: Steele Sizer Other Clinician: Referring Physician: Steele Sizer Treating Physician/Extender: Frann Rider in Treatment: 9 Active Problems Location of Pain Severity and Description of Pain Patient Has Paino No Site Locations Pain Management and Medication Current Pain Management: Electronic Signature(s) Signed: 02/24/2015 9:22:43 AM By: Regan Lemming [name], RN Entered By: Regan Lemming on 02/24/2015 09:22:42 [name], [name] ([id number]) -------------------------------------------------------------------------------- Patient/Caregiver Education Details [name]. Date of Service: 02/24/2015 9:30 AM Medical Record Number: [id number] Patient Account Number: [id number] Date of Birth/Gender: 1956-01-01 (59 y.o.  Female) Treating RN: Baruch Gouty, RN, [name], [name] Primary Care Physician: Steele Sizer Other Clinician: Referring Physician: Steele Sizer Treating Physician/Extender: Frann Rider in Treatment: 9 Education Assessment Education Provided To: Patient Education Topics Provided Safety: Methods: Explain/Verbal Responses: State content correctly Wound/Skin Impairment: Methods: Explain/Verbal Responses: State content correctly Electronic  Signature(s) Signed: 02/24/2015 9:49:18 AM By: Regan Lemming [name], RN Entered By: Regan Lemming on 02/24/2015 09:49:18 Christina, [name] ([id number]) -------------------------------------------------------------------------------- Wound Assessment Details Patient Name: WUJWJ, Khyleigh S. Date of Service: 02/24/2015 9:30 AM Medical Record Number: [id number] Patient Account Number: [id number] Date of Birth/Sex: [birth year] (59 y.o. Female) Treating RN: [name], RN, [name], [place] Primary Care Physician: Steele Sizer Other Clinician: Referring Physician: Steele Sizer Treating Physician/Extender: Frann Rider in Treatment: 9 Wound Status Wound Number: 1 Primary Pressure Ulcer Etiology: Wound Location: Right Back Wound Open Wounding Event: Blister Status: Date Acquired: 10/19/2013 Comorbid Glaucoma, Anemia, Coronary Artery Weeks Of Treatment: 9 History: Disease, Hypertension, Type II Clustered Wound: No Diabetes, End Stage Renal Disease, Osteoarthritis, Neuropathy Wound Measurements Length: (cm) 3.5 Width: (cm) 6 Depth: (cm) 0.1 Area: (cm) 16.493 Volume: (cm) 1.649 % Reduction in Area: 57.1% % Reduction in Volume: 57.1% Epithelialization: Medium (34-66%) Tunneling: No Undermining: No Wound Description Classification: Category/Stage II Wound Margin: Distinct, outline attached Exudate Amount: Large Exudate Type: Serosanguineous Exudate Color: red, brown Foul Odor After Cleansing: No Wound Bed Granulation Amount: Large (67-100%) Exposed Structure Granulation Quality: Red, Pink, Friable Fascia Exposed: No Necrotic Amount: None Present (0%) Fat Layer Exposed: No Tendon Exposed: No Muscle Exposed: No Joint Exposed: No Bone Exposed: No Limited to Skin Breakdown Periwound Skin Texture Texture Color No Abnormalities Noted: No No Abnormalities Noted: No Callus: No Atrophie Blanche: No Crepitus: No Cyanosis: No Raneri, Raaga S. ([id number]) Excoriation: No Ecchymosis:  No Fluctuance: No Erythema: No Friable: No Hemosiderin Staining: No Induration: No Mottled: No Localized Edema: No Pallor: No Rash: No Rubor: No Scarring: No Temperature / Pain Moisture Temperature: No Abnormality No Abnormalities Noted: No Dry / Scaly: No Maceration: No Moist: Yes Wound Preparation Ulcer Cleansing: Rinsed/Irrigated with Saline Topical Anesthetic Applied: None Treatment Notes Wound #1 (Right Back) 1. Cleansed with: Clean wound with Normal Saline 3. Peri-wound Care: Skin Prep 4. Dressing Applied: Mepitel 5. Secondary Dressing Applied Bordered Foam Dressing Notes drawtex Electronic Signature(s) Signed: 02/24/2015 9:31:26 AM By: Regan Lemming [name], RN Entered By: Regan Lemming on 02/24/2015 09:31:26 Mateo, [name] ([id number]) -------------------------------------------------------------------------------- Vitals Details [name]. Date of Service: 02/24/2015 9:30 AM Medical Record Number: [id number] Patient Account Number: [id number] Date of Birth/Sex: 04/12/1956 (59 y.o. Female) Treating RN: [name], RN, [name], [place] Primary Care Physician: Steele Sizer Other Clinician: Referring Physician: Steele Sizer Treating Physician/Extender: Frann Rider in Treatment: 9 Vital Signs Time Taken: 09:28 Temperature (F): 98.1 Height (in): 65 Pulse (bpm): 70 Weight (lbs): 156 Respiratory Rate (breaths/min): 18 Body Mass Index (BMI): 26 Blood Pressure (mmHg): 128/72 Reference Range: 80 - 120 mg / dl Electronic Signature(s) Signed: 02/24/2015 9:29:41 AM By: Regan Lemming [name], RN Entered By: Regan Lemming on 02/24/2015 09:29:40

## 2015-02-24 NOTE — Progress Notes (Signed)
[name], [name] ([id number]) Visit Report for 02/24/2015 Chief Complaint Document Details Patient Name: [name], [name]. Date of Service: 02/24/2015 9:30 AM Medical Record Number: [id number] Patient Account Number: [id number] Date of Birth/Sex: 1956/07/10 (59 y.o. Female) Treating RN: Primary Care Physician: Steele Sizer Other Clinician: Referring Physician: Steele Sizer Treating Physician/Extender: Frann Rider in Treatment: 9 Information Obtained from: Patient Chief Complaint Patient presents to the wound care center for a consult due non healing wound. She has an open wound on her right upper back which she's had for about a year and she recently noticed a blister on her right lower extremity about 2 weeks ago. Electronic Signature(s) Signed: 02/24/2015 9:41:19 AM By: [name], [name] Entered By: [name] on 02/24/2015 09:41:18 [name] ([id number]) -------------------------------------------------------------------------------- HPI Details Patient Name: [name], [name]. Date of Service: 02/24/2015 9:30 AM Medical Record Number: [id number] Patient Account Number: [id number] Date of Birth/Sex: 1955-10-31 (59 y.o. Female) Treating RN: Primary Care Physician: Steele Sizer Other Clinician: Referring Physician: Steele Sizer Treating Physician/Extender: Frann Rider in Treatment: 9 History of Present Illness Location: right upper back and right lower extremity wounds Quality: Patient reports No Pain. Severity: Patient states wound (s) are getting better. Duration: Patient has had the wound for > 3 months prior to seeking treatment at the wound center Timing: she thought it first occurred when she was using a heating pad about a year ago after back surgery. Context: The wound appeared gradually over time Modifying Factors: Patient is currently on renal dialysis and receives treatments 3 times weekly Associated Signs and Symptoms: Patient reports  having: surgery scheduled for this week for a AV fistula left arm. HPI Description: 59 year old patient who is known to be a diabetic and has end-stage renal disease has had several comorbidities including coronary artery disease, hypertension, hyperlipidemia, pancreatitis, anemia, previous history of hysterectomy, cholecystectomy, left-sided salivary gland excision, bilateral cataract surgery,Peritoneal dialysis catheter, hemodialysis catheter. the area on the back has also been caused by instant pressure she used to sleep on a recliner all day and has significant kyphoscoliosis. As far as the wound on her right lower extremity she's not sure how this blister occurred but she thought it has been there for about 2 weeks. No recent blood investigations available and no recent hemoglobin A1c. 12/30/2014 -- she is an assisted living facility but I believe the nurses that have not followed instructions as she had some cream applied on her back and there was a different dressing. Last week she's had a AV fistula placed on her left forearm. 01/13/2015 -- she has had some localized infection at the port site and she's been on doxycycline for this. 02/05/2015 - he has developed a small blister on her right lower extremity. 02/10/2015 -- she has developed another small blister on her right anterior chest wall in the area where she's had tape for her dialysis access. this may just be injury caused by a tape burn. 02/18/2015 -- no new blisters and she had a dermatology opinion and they have taken a biopsy of her skin. She also had a left brachial AV fistula placed this week. Electronic Signature(s) Signed: 02/24/2015 9:41:26 AM By: [name], [name] Entered By: [name] on 02/24/2015 09:41:26 [name] ([id number]) -------------------------------------------------------------------------------- Physical Exam Details [name]. Date of Service: 02/24/2015 9:30 AM Medical  Record Number: [id number] Patient Account Number: [id number] Date of Birth/Sex: 01/31/1956 (59 y.o. Female) Treating RN: Primary Care Physician: Steele Sizer Other Clinician: Referring  Physician: Steele Sizer Treating Physician/Extender: Frann Rider in Treatment: 9 Constitutional . Pulse regular. Respirations normal and unlabored. Afebrile. . Eyes Nonicteric. Reactive to light. Ears, Nose, Mouth, and Throat Lips, teeth, and gums WNL.Marland Kitchen Moist mucosa without lesions . Neck supple and nontender. No palpable supraclavicular or cervical adenopathy. Normal sized without goiter. Respiratory WNL. No retractions.. Cardiovascular Pedal Pulses WNL. No clubbing, cyanosis or edema. Lymphatic No adneopathy. No adenopathy. No adenopathy. Musculoskeletal Adexa without tenderness or enlargement.. Digits and nails w/o clubbing, cyanosis, infection, petechiae, ischemia, or inflammatory conditions.. Integumentary (Hair, Skin) No suspicious lesions. No crepitus or fluctuance. No peri-wound warmth or erythema. No masses.Marland Kitchen Psychiatric Judgement and insight Intact.. No evidence of depression, anxiety, or agitation.. Notes the area on her back has epithelialized nicely and there is some area which is mildly hyper granulated. Electronic Signature(s) Signed: 02/24/2015 9:42:41 AM By: [name], [name] Previous Signature: 02/24/2015 9:42:07 AM Version By: [name], [name] Entered By: [name] on 02/24/2015 09:42:41 [name] ([id number]) -------------------------------------------------------------------------------- Physician Orders Details [name]. Date of Service: 02/24/2015 9:30 AM Medical Record Patient Account Number: [id number] Number: [name], RN, [name], Treating RN: 1956-03-31 ([name] y.o. [name] Date of Birth/Sex: Female) Other Clinician: Primary Care Physician: Steele Sizer Treating [name] Referring Physician: Steele Sizer Physician/Extender: Suella Grove in Treatment: 9 Verbal / Phone Orders: Yes Clinician: [name], RN, [name], [name] Read Back and Verified: Yes Diagnosis Coding Wound Cleansing Wound #1 Right Back o Clean wound with Normal Saline. Skin Barriers/Peri-Wound Care Wound #1 Right Back o Skin Prep Primary Wound Dressing Wound #1 Right Back o Drawtex o Mepitel One Secondary Dressing Wound #1 Right Back o Boardered Foam Dressing Dressing Change Frequency Wound #1 Right Back o Change dressing every week Follow-up Appointments Wound #1 Right Back o Return Appointment in 1 week. Electronic Signature(s) Signed: 02/24/2015 9:38:24 AM By: Regan Lemming [name], RN Signed: 02/24/2015 12:16:19 PM By: [name], [name] Entered By: Regan Lemming on 02/24/2015 09:38:23 Dooner, Christean [name] ([id number]) -------------------------------------------------------------------------------- Problem List Details Patient Name: [name], [name]. Date of Service: 02/24/2015 9:30 AM Medical Record Number: [id number] Patient Account Number: [id number] Date of Birth/Sex: Mar 26, 1956 (59 y.o. Female) Treating RN: Primary Care Physician: Steele Sizer Other Clinician: Referring Physician: Steele Sizer Treating Physician/Extender: Frann Rider in Treatment: 9 Active Problems ICD-10 Encounter Code Description Active Date Diagnosis E11.622 Type 2 diabetes mellitus with other skin ulcer 12/23/2014 Yes L89.112 Pressure ulcer of right upper back, stage 2 12/23/2014 Yes S80.821A Blister (nonthermal), right lower leg, initial encounter 12/23/2014 Yes N18.6 End stage renal disease 12/23/2014 Yes Inactive Problems Resolved Problems Electronic Signature(s) Signed: 02/24/2015 9:41:10 AM By: [name], [name] Entered By: [name] on 02/24/2015 09:41:10 Markos, Christean [name] ([id number]) -------------------------------------------------------------------------------- Progress Note Details Patient Name: [name], [name]  S. Date of Service: 02/24/2015 9:30 AM Medical Record Number: [id number] Patient Account Number: [id number] Date of Birth/Sex: 25-Dec-1955 (59 y.o. Female) Treating RN: Primary Care Physician: Steele Sizer Other Clinician: Referring Physician: Steele Sizer Treating Physician/Extender: Frann Rider in Treatment: 9 Subjective Chief Complaint Information obtained from Patient Patient presents to the wound care center for a consult due non healing wound. She has an open wound on her right upper back which she's had for about a year and she recently noticed a blister on her right lower extremity about 2 weeks ago. History of Present Illness (HPI) The following HPI elements were documented for the patient's wound: Location: right upper back and right lower extremity wounds  Quality: Patient reports No Pain. Severity: Patient states wound (s) are getting better. Duration: Patient has had the wound for > 3 months prior to seeking treatment at the wound center Timing: she thought it first occurred when she was using a heating pad about a year ago after back surgery. Context: The wound appeared gradually over time Modifying Factors: Patient is currently on renal dialysis and receives treatments 3 times weekly Associated Signs and Symptoms: Patient reports having: surgery scheduled for this week for a AV fistula left arm. 59 year old patient who is known to be a diabetic and has end-stage renal disease has had several comorbidities including coronary artery disease, hypertension, hyperlipidemia, pancreatitis, anemia, previous history of hysterectomy, cholecystectomy, left-sided salivary gland excision, bilateral cataract surgery,Peritoneal dialysis catheter, hemodialysis catheter. the area on the back has also been caused by instant pressure she used to sleep on a recliner all day and has significant kyphoscoliosis. As far as the wound on her right lower extremity she's not sure how  this blister occurred but she thought it has been there for about 2 weeks. No recent blood investigations available and no recent hemoglobin A1c. 12/30/2014 -- she is an assisted living facility but I believe the nurses that have not followed instructions as she had some cream applied on her back and there was a different dressing. Last week she's had a AV fistula placed on her left forearm. 01/13/2015 -- she has had some localized infection at the port site and she's been on doxycycline for this. 02/05/2015 - he has developed a small blister on her right lower extremity. 02/10/2015 -- she has developed another small blister on her right anterior chest wall in the area where she's had tape for her dialysis access. this may just be injury caused by a tape burn. 02/18/2015 -- no new blisters and she had a dermatology opinion and they have taken a biopsy of her skin. Bushnell, Christean [name] ([id number]) She also had a left brachial AV fistula placed this week. Objective Constitutional Pulse regular. Respirations normal and unlabored. Afebrile. Vitals Time Taken: 9:28 AM, Height: 65 in, Weight: 156 lbs, BMI: 26, Temperature: 98.1 F, Pulse: [name] bpm, Respiratory Rate: 18 breaths/min, Blood Pressure: 128/72 mmHg. Eyes Nonicteric. Reactive to light. Ears, Nose, Mouth, and Throat Lips, teeth, and gums WNL.Marland Kitchen Moist mucosa without lesions . Neck supple and nontender. No palpable supraclavicular or cervical adenopathy. Normal sized without goiter. Respiratory WNL. No retractions.. Cardiovascular Pedal Pulses WNL. No clubbing, cyanosis or edema. Lymphatic No adneopathy. No adenopathy. No adenopathy. Musculoskeletal Adexa without tenderness or enlargement.. Digits and nails w/o clubbing, cyanosis, infection, petechiae, ischemia, or inflammatory conditions.Marland Kitchen Psychiatric Judgement and insight Intact.. No evidence of depression, anxiety, or agitation.. General Notes: the area on her back has epithelialized  nicely and there is some area which is mildly hyper granulated. Integumentary (Hair, Skin) No suspicious lesions. No crepitus or fluctuance. No peri-wound warmth or erythema. No masses.. Wound #1 status is Open. Original cause of wound was Blister. The wound is located on the Right Back. The wound measures 3.5cm length x 6cm width x 0.1cm depth; 16.493cm^2 area and 1.649cm^3 volume. Smyth, Christean [name] ([id number]) The wound is limited to skin breakdown. There is no tunneling or undermining noted. There is a large amount of serosanguineous drainage noted. The wound margin is distinct with the outline attached to the wound base. There is large (67-100%) red, pink, friable granulation within the wound bed. There is no necrotic tissue within the wound bed. The periwound skin  appearance exhibited: Moist. The periwound skin appearance did not exhibit: Callus, Crepitus, Excoriation, Fluctuance, Friable, Induration, Localized Edema, Rash, Scarring, Dry/Scaly, Maceration, Atrophie Blanche, Cyanosis, Ecchymosis, Hemosiderin Staining, Mottled, Pallor, Rubor, Erythema. Periwound temperature was noted as No Abnormality. Assessment Active Problems ICD-10 E11.622 - Type 2 diabetes mellitus with other skin ulcer L89.112 - Pressure ulcer of right upper back, stage 2 S80.821A - Blister (nonthermal), right lower leg, initial encounter N18.6 - End stage renal disease We will continue to use Mepitel a bolster and then the DrawTex over this and a foam border to be left intact for a week if possible. She will be visiting her dermatologist soon and hopefully we'll have a report of her skin condition. She will come back and see me next week. Plan Wound Cleansing: Wound #1 Right Back: Clean wound with Normal Saline. Skin Barriers/Peri-Wound Care: Wound #1 Right Back: Skin Prep Primary Wound Dressing: Wound #1 Right Back: Drawtex Mepitel One Secondary Dressing: Wound #1 Right Back: Boardered Foam  Dressing Dressing Change Frequency: Wound #1 Right Back: Change dressing every week Follow-up Appointments: [name], [name] ([id number]) Wound #1 Right Back: Return Appointment in 1 week. We will continue to use Mepitel a bolster and then the DrawTex over this and a foam border to be left intact for a week if possible. She will be visiting her dermatologist soon and hopefully we'll have a report of her skin condition. She will come back and see me next week. Electronic Signature(s) Signed: 02/24/2015 9:44:04 AM By: [name], [name] Entered By: [name] on 02/24/2015 09:44:04 Vannote, Christean [name] ([id number]) -------------------------------------------------------------------------------- SuperBill Details [name]. Date of Service: 02/24/2015 Medical Record Number: [id number] Patient Account Number: [id number] Date of Birth/Sex: [birth year] (59 y.o. Female) Treating RN: Primary Care Physician: Steele Sizer Other Clinician: Referring Physician: Steele Sizer Treating Physician/Extender: Frann Rider in Treatment: 9 Diagnosis Coding ICD-10 Codes Code Description E11.622 Type 2 diabetes mellitus with other skin ulcer L89.112 Pressure ulcer of right upper back, stage 2 S80.821A Blister (nonthermal), right lower leg, initial encounter N18.6 End stage renal disease Physician Procedures CPT4 Code: [id number] Description: 62863 - WC PHYS LEVEL 3 - EST PT ICD-10 Description Diagnosis E11.622 Type 2 diabetes mellitus with other skin ulcer L89.112 Pressure ulcer of right upper back, stage 2 S80.821A Blister (nonthermal), right lower leg, initial enc Modifier: ounter Quantity: 1 Electronic Signature(s) Signed: 02/24/2015 9:44:19 AM By: [name], [name] Entered By: [name] on 02/24/2015 09:44:19

## 2015-03-03 ENCOUNTER — Encounter: Payer: Medicare Other | Admitting: Surgery

## 2015-03-03 DIAGNOSIS — L89112 Pressure ulcer of right upper back, stage 2: Secondary | ICD-10-CM | POA: Diagnosis not present

## 2015-03-03 NOTE — Progress Notes (Addendum)
JARRAH, SEHER ([id number]) Visit Report for 03/03/2015 Chief Complaint Document Details [name], [name]. Date of Service: 03/03/2015 9:30 AM Medical Record Number: [id number] Patient Account Number: [id number] Date of Birth/Sex: 05/27/1956 (59 y.o. Female) Treating RN: Cornell Barman Primary Care Physician: Steele Sizer Other Clinician: Referring Physician: Steele Sizer Treating Physician/Extender: Frann Rider in Treatment: 10 Information Obtained from: Patient Chief Complaint Patient presents to the wound care center for a consult due non healing wound. She has an open wound on her right upper back which she's had for about a year and she recently noticed a blister on her right lower extremity about 2 weeks ago. Electronic Signature(s) Signed: 03/03/2015 9:49:07 AM By: [name], [name] Entered By: [name] on 03/03/2015 09:49:07 Fennel, [name] ([id number]) -------------------------------------------------------------------------------- HPI Details [name], [name]. Date of Service: 03/03/2015 9:30 AM Medical Record Number: [id number] Patient Account Number: [id number] Date of Birth/Sex: [birth year] (59 y.o. Female) Treating RN: Cornell Barman Primary Care Physician: Steele Sizer Other Clinician: Referring Physician: Steele Sizer Treating Physician/Extender: Frann Rider in Treatment: 10 History of Present Illness Location: right upper back and right lower extremity wounds Quality: Patient reports No Pain. Severity: Patient states wound (s) are getting better. Duration: Patient has had the wound for > 3 months prior to seeking treatment at the wound center Timing: she thought it first occurred when she was using a heating pad about a year ago after back surgery. Context: The wound appeared gradually over time Modifying Factors: Patient is currently on renal dialysis and receives treatments 3 times weekly Associated Signs and  Symptoms: Patient reports having: surgery scheduled for this week for a AV fistula left arm. HPI Description: 59 year old patient who is known to be a diabetic and has end-stage renal disease has had several comorbidities including coronary artery disease, hypertension, hyperlipidemia, pancreatitis, anemia, previous history of hysterectomy, cholecystectomy, left-sided salivary gland excision, bilateral cataract surgery,Peritoneal dialysis catheter, hemodialysis catheter. the area on the back has also been caused by instant pressure she used to sleep on a recliner all day and has significant kyphoscoliosis. As far as the wound on her right lower extremity she's not sure how this blister occurred but she thought it has been there for about 2 weeks. No recent blood investigations available and no recent hemoglobin A1c. 12/30/2014 -- she is an assisted living facility but I believe the nurses that have not followed instructions as she had some cream applied on her back and there was a different dressing. Last week she's had a AV fistula placed on her left forearm. 01/13/2015 -- she has had some localized infection at the port site and she's been on doxycycline for this. 02/05/2015 - he has developed a small blister on her right lower extremity. 02/10/2015 -- she has developed another small blister on her right anterior chest wall in the area where she's had tape for her dialysis access. this may just be injury caused by a tape burn. 02/18/2015 -- no new blisters and she had a dermatology opinion and they have taken a biopsy of her skin. She also had a left brachial AV fistula placed this week. 03/03/2015 -- though we do not have the pathology report yet the patient says she has been put on prednisone because this skin disease is possibly to do with her immune system and her dermatologist is recommended this. Electronic Signature(s) Signed: 03/03/2015 9:49:46 AM By: [name], [name] Entered  By: [name] on 03/03/2015 09:49:46 Demedeiros, Monzerrath  Chauncey Cruel ([id number]) Kohlmann, [name] ([id number]) -------------------------------------------------------------------------------- Physical Exam Details [name]. Date of Service: 03/03/2015 9:30 AM Medical Record Number: [id number] Patient Account Number: [id number] Date of Birth/Sex: 08-May-1956 (59 y.o. Female) Treating RN: Cornell Barman Primary Care Physician: Steele Sizer Other Clinician: Referring Physician: Steele Sizer Treating Physician/Extender: Frann Rider in Treatment: 10 Constitutional . Pulse regular. Respirations normal and unlabored. Afebrile. . Eyes Nonicteric. Reactive to light. Ears, Nose, Mouth, and Throat Lips, teeth, and gums WNL.Marland Kitchen Moist mucosa without lesions . Neck supple and nontender. No palpable supraclavicular or cervical adenopathy. Normal sized without goiter. Respiratory WNL. No retractions.. Cardiovascular Pedal Pulses WNL. No clubbing, cyanosis or edema. Chest Breasts symmetical and no nipple discharge.. Breast tissue WNL, no masses, lumps, or tenderness.. Lymphatic No adneopathy. No adenopathy. No adenopathy. Musculoskeletal Adexa without tenderness or enlargement.. Digits and nails w/o clubbing, cyanosis, infection, petechiae, ischemia, or inflammatory conditions.. Integumentary (Hair, Skin) No suspicious lesions. No crepitus or fluctuance. No peri-wound warmth or erythema. No masses.Marland Kitchen Psychiatric Judgement and insight Intact.. No evidence of depression, anxiety, or agitation.. Notes Quite a significant amount of the wound has closed and there is a central area where there is still some open non -epithelialized area. Electronic Signature(s) Signed: 03/03/2015 9:51:02 AM By: [name], [name] Entered By: [name] on 03/03/2015 09:51:02 Popson, [name] ([id number]) -------------------------------------------------------------------------------- Physician  Orders Details [name]. Date of Service: 03/03/2015 9:30 AM Medical Record Patient Account Number: [id number] Number: [name], RN, [name], Treating RN: 29-Apr-1956 (59 y.o. [name] Date of Birth/Sex: Female) Other Clinician: Primary Care Physician: Steele Sizer Treating [name] Referring Physician: Steele Sizer Physician/Extender: Suella Grove in Treatment: 10 Verbal / Phone Orders: Yes Clinician: [name], RN, [name], [name] Read Back and Verified: Yes Diagnosis Coding Wound Cleansing Wound #1 Right Back o Clean wound with Normal Saline. Skin Barriers/Peri-Wound Care Wound #1 Right Back o Skin Prep Primary Wound Dressing Wound #1 Right Back o Aquacel o Drawtex o Mepitel One Secondary Dressing Wound #1 Right Back o Boardered Foam Dressing Dressing Change Frequency Wound #1 Right Back o Change dressing every week - Please keep dressing in place. can change outer with excess drainage prn. Follow-up Appointments Wound #1 Right Back o Return Appointment in 1 week. Electronic Signature(s) Signed: 03/03/2015 10:00:57 AM By: Regan Lemming [name], RN Signed: 03/03/2015 12:24:58 PM By: [name], [name] Previous Signature: 03/03/2015 9:55:30 AM Version By: Regan Lemming [name], RN Entered By: Regan Lemming on 03/03/2015 10:00:57 Oser, [name] ([id number]) Omara, [name] ([id number]) -------------------------------------------------------------------------------- Problem List Details Patient Name: MILANNA, KOZLOV. Date of Service: 03/03/2015 9:30 AM Medical Record Number: [id number] Patient Account Number: [id number] Date of Birth/Sex: [birth year] (59 y.o. Female) Treating RN: Cornell Barman Primary Care Physician: Steele Sizer Other Clinician: Referring Physician: Steele Sizer Treating Physician/Extender: Frann Rider in Treatment: 10 Active Problems ICD-10 Encounter Code Description Active Date Diagnosis E11.622 Type 2 diabetes  mellitus with other skin ulcer 12/23/2014 Yes L89.112 Pressure ulcer of right upper back, stage 2 12/23/2014 Yes S80.821A Blister (nonthermal), right lower leg, initial encounter 12/23/2014 Yes N18.6 End stage renal disease 12/23/2014 Yes Inactive Problems Resolved Problems Electronic Signature(s) Signed: 03/03/2015 9:48:57 AM By: [name], [name] Entered By: [name] on 03/03/2015 09:48:57 Waldman, [name] ([id number]) -------------------------------------------------------------------------------- Progress Note Details Patient Name: Avina, Annelise S. Date of Service: 03/03/2015 9:30 AM Medical Record Number: [id number] Patient Account Number: [id number] Date of Birth/Sex: 04-24-1956 (59 y.o. Female) Treating RN: Cornell Barman Primary Care Physician: Ancil Boozer,  Drue Stager Other Clinician: Referring Physician: Steele Sizer Treating Physician/Extender: Frann Rider in Treatment: 10 Subjective Chief Complaint Information obtained from Patient Patient presents to the wound care center for a consult due non healing wound. She has an open wound on her right upper back which she's had for about a year and she recently noticed a blister on her right lower extremity about 2 weeks ago. History of Present Illness (HPI) The following HPI elements were documented for the patient's wound: Location: right upper back and right lower extremity wounds Quality: Patient reports No Pain. Severity: Patient states wound (s) are getting better. Duration: Patient has had the wound for > 3 months prior to seeking treatment at the wound center Timing: she thought it first occurred when she was using a heating pad about a year ago after back surgery. Context: The wound appeared gradually over time Modifying Factors: Patient is currently on renal dialysis and receives treatments 3 times weekly Associated Signs and Symptoms: Patient reports having: surgery scheduled for this week for a AV fistula left  arm. 59 year old patient who is known to be a diabetic and has end-stage renal disease has had several comorbidities including coronary artery disease, hypertension, hyperlipidemia, pancreatitis, anemia, previous history of hysterectomy, cholecystectomy, left-sided salivary gland excision, bilateral cataract surgery,Peritoneal dialysis catheter, hemodialysis catheter. the area on the back has also been caused by instant pressure she used to sleep on a recliner all day and has significant kyphoscoliosis. As far as the wound on her right lower extremity she's not sure how this blister occurred but she thought it has been there for about 2 weeks. No recent blood investigations available and no recent hemoglobin A1c. 12/30/2014 -- she is an assisted living facility but I believe the nurses that have not followed instructions as she had some cream applied on her back and there was a different dressing. Last week she's had a AV fistula placed on her left forearm. 01/13/2015 -- she has had some localized infection at the port site and she's been on doxycycline for this. 02/05/2015 - he has developed a small blister on her right lower extremity. 02/10/2015 -- she has developed another small blister on her right anterior chest wall in the area where she's had tape for her dialysis access. this may just be injury caused by a tape burn. 02/18/2015 -- no new blisters and she had a dermatology opinion and they have taken a biopsy of her skin. Jankovich, [name] ([id number]) She also had a left brachial AV fistula placed this week. 03/03/2015 -- though we do not have the pathology report yet the patient says she has been put on prednisone because this skin disease is possibly to do with her immune system and her dermatologist is recommended this. Objective Constitutional Pulse regular. Respirations normal and unlabored. Afebrile. Vitals Time Taken: 9:35 AM, Height: 65 in, Weight: 156 lbs, BMI: 26,  Temperature: 98.0 F, Pulse: 75 bpm, Respiratory Rate: 18 breaths/min, Blood Pressure: 185/73 mmHg. Eyes Nonicteric. Reactive to light. Ears, Nose, Mouth, and Throat Lips, teeth, and gums WNL.Marland Kitchen Moist mucosa without lesions . Neck supple and nontender. No palpable supraclavicular or cervical adenopathy. Normal sized without goiter. Respiratory WNL. No retractions.. Cardiovascular Pedal Pulses WNL. No clubbing, cyanosis or edema. Chest Breasts symmetical and no nipple discharge.. Breast tissue WNL, no masses, lumps, or tenderness.. Lymphatic No adneopathy. No adenopathy. No adenopathy. Musculoskeletal Adexa without tenderness or enlargement.. Digits and nails w/o clubbing, cyanosis, infection, petechiae, ischemia, or inflammatory conditions.Marland Kitchen Psychiatric Judgement and  insight Intact.. No evidence of depression, anxiety, or agitation.. General Notes: Quite a significant amount of the wound has closed and there is a central area where there is still some open non -epithelialized area. [name] ([id number]) Integumentary (Hair, Skin) No suspicious lesions. No crepitus or fluctuance. No peri-wound warmth or erythema. No masses.. Wound #1 status is Open. Original cause of wound was Blister. The wound is located on the Right Back. The wound measures 3.5cm length x 2.5cm width x 0.1cm depth; 6.872cm^2 area and 0.687cm^3 volume. The wound is limited to skin breakdown. There is no tunneling or undermining noted. There is a large amount of serosanguineous drainage noted. The wound margin is distinct with the outline attached to the wound base. There is large (67-100%) red, pink, friable granulation within the wound bed. There is no necrotic tissue within the wound bed. The periwound skin appearance exhibited: Moist. The periwound skin appearance did not exhibit: Callus, Crepitus, Excoriation, Fluctuance, Friable, Induration, Localized Edema, Rash, Scarring, Dry/Scaly, Maceration, Atrophie  Blanche, Cyanosis, Ecchymosis, Hemosiderin Staining, Mottled, Pallor, Rubor, Erythema. Periwound temperature was noted as No Abnormality. Assessment Active Problems ICD-10 E11.622 - Type 2 diabetes mellitus with other skin ulcer L89.112 - Pressure ulcer of right upper back, stage 2 S80.821A - Blister (nonthermal), right lower leg, initial encounter N18.6 - End stage renal disease I will use Mepitel and then a layer of silver alginate and Drawtex to help control the fluid oozing through the raw surface. She is urged to keep this on for a week and come back and see me next week. Plan Wound Cleansing: Wound #1 Right Back: Clean wound with Normal Saline. Skin Barriers/Peri-Wound Care: Wound #1 Right Back: Skin Prep Primary Wound Dressing: Wound #1 Right Back: Aquacel Drawtex Mepitel One Secondary Dressing: Harty, [name] ([id number]) Wound #1 Right Back: Boardered Foam Dressing Dressing Change Frequency: Wound #1 Right Back: Change dressing every week - Please keep dressing in place. can change outer with excess drainage prn. Follow-up Appointments: Wound #1 Right Back: Return Appointment in 1 week. I will use Mepitel and then a layer of silver alginate and Drawtex to help control the fluid oozing through the raw surface. She is urged to keep this on for a week and come back and see me next week. Electronic Signature(s) Signed: 03/05/2015 4:26:23 PM By: [name], [name] Previous Signature: 03/03/2015 9:52:18 AM Version By: [name], [name] Entered By: [name] on 03/05/2015 16:26:22 Cantero, [name] ([id number]) -------------------------------------------------------------------------------- SuperBill Details [name]. Date of Service: 03/03/2015 Medical Record Number: [id number] Patient Account Number: [id number] Date of Birth/Sex: 1956-03-07 (59 y.o. Female) Treating RN: Cornell Barman Primary Care Physician: Steele Sizer Other  Clinician: Referring Physician: Steele Sizer Treating Physician/Extender: Frann Rider in Treatment: 10 Diagnosis Coding ICD-10 Codes Code Description E11.622 Type 2 diabetes mellitus with other skin ulcer L89.112 Pressure ulcer of right upper back, stage 2 S80.821A Blister (nonthermal), right lower leg, initial encounter N18.6 End stage renal disease Facility Procedures CPT4 Code: [id number] Description: [phone] - WOUND CARE VISIT-LEV 2 EST PT Modifier: Quantity: 1 Physician Procedures CPT4 Code: [id number] Description: 82423 - WC PHYS LEVEL 3 - EST PT ICD-10 Description Diagnosis E11.622 Type 2 diabetes mellitus with other skin ulcer L89.112 Pressure ulcer of right upper back, stage 2 S80.821A Blister (nonthermal), right lower leg, initial enc Modifier: ounter Quantity: 1 Electronic Signature(s) Signed: 03/04/2015 4:16:56 PM By: Regan Lemming [name], RN Signed: 03/05/2015 1:56:54 PM By: [name], [name] Previous Signature:  03/03/2015 9:52:34 AM Version By: [name], [name] Entered By: Regan Lemming on 03/04/2015 16:16:56

## 2015-03-03 NOTE — Progress Notes (Signed)
[name], [name] ([id number]) Visit Report for 03/03/2015 Arrival Information Details [name], [name]. Date of Service: 03/03/2015 9:30 AM Medical Record Number: [id number] Patient Account Number: [id number] Date of Birth/Sex: September 26, 1955 (59 y.o. Female) Treating RN: Baruch Gouty, RN, [name], [name] Primary Care Physician: Steele Sizer Other Clinician: Referring Physician: Steele Sizer Treating Physician/Extender: Frann Rider in Treatment: 10 Visit Information History Since Last Visit Any new allergies or adverse reactions: No Patient Arrived: [name] Had a fall or experienced change in No Arrival Time: 09:34 activities of daily living that may affect Accompanied By: SELF risk of falls: Transfer Assistance: None Signs or symptoms of abuse/neglect since last No Patient Identification Verified: Yes visito Secondary Verification Process Yes Hospitalized since last visit: No Completed: Has Dressing in Place as Prescribed: Yes Patient Requires Transmission- No Pain Present Now: No Based Precautions: Patient Has Alerts: Yes Patient Alerts: Patient on Blood Thinner ABI R: 1.28 Electronic Signature(s) Signed: 03/03/2015 9:35:55 AM By: Regan Lemming [name], RN Entered By: Regan Lemming on 03/03/2015 09:35:55 Alvarenga, [name] ([id number]) -------------------------------------------------------------------------------- Clinic Level of Care Assessment Details [name]. Date of Service: 03/03/2015 9:30 AM Medical Record Number: [id number] Patient Account Number: [id number] Date of Birth/Sex: Jun 26, 1956 (59 y.o. Female) Treating RN: [name], RN, [name], [place] Primary Care Physician: Steele Sizer Other Clinician: Referring Physician: Steele Sizer Treating Physician/Extender: Frann Rider in Treatment: 10 Clinic Level of Care Assessment Items TOOL 4 Quantity Score []  - Use when only an EandM is performed on FOLLOW-UP visit 0 ASSESSMENTS - Nursing  Assessment / Reassessment X - Reassessment of Co-morbidities (includes updates in patient status) 1 10 X - Reassessment of Adherence to Treatment Plan 1 5 ASSESSMENTS - Wound and Skin Assessment / Reassessment X - Simple Wound Assessment / Reassessment - one wound 1 5 []  - Complex Wound Assessment / Reassessment - multiple wounds 0 []  - Dermatologic / Skin Assessment (not related to wound area) 0 ASSESSMENTS - Focused Assessment []  - Circumferential Edema Measurements - multi extremities 0 []  - Nutritional Assessment / Counseling / Intervention 0 []  - Lower Extremity Assessment (monofilament, tuning fork, pulses) 0 []  - Peripheral Arterial Disease Assessment (using hand held doppler) 0 ASSESSMENTS - Ostomy and/or Continence Assessment and Care []  - Incontinence Assessment and Management 0 []  - Ostomy Care Assessment and Management (repouching, etc.) 0 PROCESS - Coordination of Care X - Simple Patient / Family Education for ongoing care 1 15 []  - Complex (extensive) Patient / Family Education for ongoing care 0 []  - Staff obtains Programmer, systems, Records, Test Results / Process Orders 0 []  - Staff telephones HHA, Nursing Homes / Clarify orders / etc 0 []  - Routine Transfer to another Facility (non-emergent condition) 0 Spagnuolo, Laresa S. ([id number]) []  - Routine Hospital Admission (non-emergent condition) 0 []  - New Admissions / Biomedical engineer / Ordering NPWT, Apligraf, etc. 0 []  - Emergency Hospital Admission (emergent condition) 0 X - Simple Discharge Coordination 1 10 []  - Complex (extensive) Discharge Coordination 0 PROCESS - Special Needs []  - Pediatric / Minor Patient Management 0 []  - Isolation Patient Management 0 []  - Hearing / Language / Visual special needs 0 []  - Assessment of Community assistance (transportation, D/C planning, etc.) 0 []  - Additional assistance / Altered mentation 0 []  - Support Surface(s) Assessment (bed, cushion, seat, etc.) 0 INTERVENTIONS - Wound  Cleansing / Measurement X - Simple Wound Cleansing - one wound 1 5 []  - Complex Wound Cleansing - multiple wounds 0 X - Wound Imaging (photographs -  any number of wounds) 1 5 []  - Wound Tracing (instead of photographs) 0 X - Simple Wound Measurement - one wound 1 5 []  - Complex Wound Measurement - multiple wounds 0 INTERVENTIONS - Wound Dressings X - Small Wound Dressing one or multiple wounds 1 10 []  - Medium Wound Dressing one or multiple wounds 0 []  - Large Wound Dressing one or multiple wounds 0 []  - Application of Medications - topical 0 []  - Application of Medications - injection 0 INTERVENTIONS - Miscellaneous []  - External ear exam 0 Mayorga, Tashianna S. ([id number]) []  - Specimen Collection (cultures, biopsies, blood, body fluids, etc.) 0 []  - Specimen(s) / Culture(s) sent or taken to Lab for analysis 0 []  - Patient Transfer (multiple staff / Harrel Lemon Lift / Similar devices) 0 []  - Simple Staple / Suture removal (25 or less) 0 []  - Complex Staple / Suture removal (26 or more) 0 []  - Hypo / Hyperglycemic Management (close monitor of Blood Glucose) 0 []  - Ankle / Brachial Index (ABI) - do not check if billed separately 0 X - Vital Signs 1 5 Has the patient been seen at the hospital within the last three years: Yes Total Score: 75 Level Of Care: New/Established - Level 2 Electronic Signature(s) Signed: 03/03/2015 10:01:55 AM By: Regan Lemming [name], RN Entered By: Regan Lemming on 03/03/2015 10:01:54 Warn, [name] ([id number]) -------------------------------------------------------------------------------- Encounter Discharge Information Details [name]. Date of Service: 03/03/2015 9:30 AM Medical Record Number: [id number] Patient Account Number: [id number] Date of Birth/Sex: 1955/09/15 (59 y.o. Female) Treating RN: Cornell Barman Primary Care Physician: Steele Sizer Other Clinician: Referring Physician: Steele Sizer Treating Physician/Extender: Frann Rider in Treatment: 10 Encounter Discharge Information Items Schedule Follow-up Appointment: No Medication Reconciliation completed No and provided to Patient/Care Cloma Rahrig: Provided on Clinical Summary of Care: 03/03/2015 Form Type Recipient Paper Patient LD Electronic Signature(s) Signed: 03/03/2015 9:57:43 AM By: Ruthine Dose Entered By: Ruthine Dose on 03/03/2015 09:57:42 Kyne, [name] ([id number]) -------------------------------------------------------------------------------- Lower Extremity Assessment Details Patient Name: Valent, Ailyn S. Date of Service: 03/03/2015 9:30 AM Medical Record Number: [id number] Patient Account Number: [id number] Date of Birth/Sex: Jan 08, 1956 (59 y.o. Female) Treating RN: [name], RN, [name], [name] Primary Care Physician: Steele Sizer Other Clinician: Referring Physician: Steele Sizer Treating Physician/Extender: Frann Rider in Treatment: 10 Electronic Signature(s) Signed: 03/03/2015 9:37:27 AM By: Regan Lemming [name], RN Entered By: Regan Lemming on 03/03/2015 09:37:27 Strege, [name] ([id number]) -------------------------------------------------------------------------------- Multi Wound Chart Details Patient Name: NIRVANA, BLANCHETT. Date of Service: 03/03/2015 9:30 AM Medical Record Number: [id number] Patient Account Number: [id number] Date of Birth/Sex: 12-11-1955 (59 y.o. Female) Treating RN: Baruch Gouty, RN, [name], [name] Primary Care Physician: Steele Sizer Other Clinician: Referring Physician: Steele Sizer Treating Physician/Extender: Frann Rider in Treatment: 10 Vital Signs Height(in): 65 Pulse(bpm): 75 Weight(lbs): 156 Blood Pressure 185/73 (mmHg): Body Mass Index(BMI): 26 Temperature(F): 98.0 Respiratory Rate 18 (breaths/min): Photos: [1:No Photos] [N/A:N/A] Wound Location: [1:Right Back] [N/A:N/A] Wounding Event: [1:Blister] [N/A:N/A] Primary Etiology: [1:Pressure Ulcer] [N/A:N/A] Comorbid History:  [1:Glaucoma, Anemia, Coronary Artery Disease, Hypertension, Type II Diabetes, End Stage Renal Disease, Osteoarthritis, Neuropathy] [N/A:N/A] Date Acquired: [1:10/19/2013] [N/A:N/A] Weeks of Treatment: [1:10] [N/A:N/A] Wound Status: [1:Open] [N/A:N/A] Measurements L x W x D 3.5x2.5x0.1 [N/A:N/A] (cm) Area (cm) : [1:6.872] [N/A:N/A] Volume (cm) : [1:0.687] [N/A:N/A] % Reduction in Area: [1:82.10%] [N/A:N/A] % Reduction in Volume: 82.10% [N/A:N/A] Classification: [1:Category/Stage II] [N/A:N/A] Exudate Amount: [1:Large] [N/A:N/A] Exudate Type: [1:Serosanguineous] [N/A:N/A] Exudate Color: [1:red, brown] [N/A:N/A] Wound Margin: [1:Distinct, outline attached N/A]  Granulation Amount: [1:Large (67-100%)] [N/A:N/A] Granulation Quality: [1:Red, Pink, Friable] [N/A:N/A] Necrotic Amount: [1:None Present (0%)] [N/A:N/A] Exposed Structures: [1:Fascia: No Fat: No Tendon: No] [N/A:N/A] Muscle: No Joint: No Bone: No Limited to Skin Breakdown Epithelialization: Medium (34-66%) N/A N/A Periwound Skin Texture: Edema: No N/A N/A Excoriation: No Induration: No Callus: No Crepitus: No Fluctuance: No Friable: No Rash: No Scarring: No Periwound Skin Moist: Yes N/A N/A Moisture: Maceration: No Dry/Scaly: No Periwound Skin Color: Atrophie Blanche: No N/A N/A Cyanosis: No Ecchymosis: No Erythema: No Hemosiderin Staining: No Mottled: No Pallor: No Rubor: No Temperature: No Abnormality N/A N/A Tenderness on No N/A N/A Palpation: Wound Preparation: Ulcer Cleansing: N/A N/A Rinsed/Irrigated with Saline Topical Anesthetic Applied: None Treatment Notes Electronic Signature(s) Signed: 03/03/2015 9:46:17 AM By: Regan Lemming [name], RN Entered By: Regan Lemming on 03/03/2015 09:46:17 Husby, [name] ([id number]) -------------------------------------------------------------------------------- [facility] Details [name]. Date of Service: 03/03/2015 9:30  AM Medical Record Number: [id number] Patient Account Number: [id number] Date of Birth/Sex: 1956-03-02 (59 y.o. Female) Treating RN: Baruch Gouty, RN, [name], [name] Primary Care Physician: Steele Sizer Other Clinician: Referring Physician: Steele Sizer Treating Physician/Extender: Frann Rider in Treatment: 10 Active Inactive Abuse / Safety / Falls / Self Care Management Nursing Diagnoses: Potential for falls Goals: Patient will remain injury free Date Initiated: 12/23/2014 Goal Status: Active Patient/caregiver will verbalize understanding of skin care regimen Date Initiated: 12/23/2014 Goal Status: Active Patient/caregiver will verbalize/demonstrate measures taken to prevent injury and/or falls Date Initiated: 12/23/2014 Goal Status: Active Patient/caregiver will verbalize/demonstrate understanding of what to do in case of emergency Date Initiated: 12/23/2014 Goal Status: Active Interventions: Assess fall risk on admission and as needed Assess: immobility, friction, shearing, incontinence upon admission and as needed Assess impairment of mobility on admission and as needed per policy Assess self care needs on admission and as needed Provide education on fall prevention Notes: Wound/Skin Impairment Nursing Diagnoses: Impaired tissue integrity Knowledge deficit related to ulceration/compromised skin integrity Goals: Patient/caregiver will verbalize understanding of skin care regimen RAMATOULAYE, PACK ([id number]) Date Initiated: 12/23/2014 Goal Status: Active Ulcer/skin breakdown will heal within 14 weeks Date Initiated: 12/23/2014 Goal Status: Active Interventions: Assess patient/caregiver ability to obtain necessary supplies Assess patient/caregiver ability to perform ulcer/skin care regimen upon admission and as needed Assess ulceration(s) every visit Provide education on ulcer and skin care Treatment Activities: Skin care regimen initiated : 03/03/2015 Topical wound management  initiated : 03/03/2015 Notes: Electronic Signature(s) Signed: 03/03/2015 9:44:06 AM By: Regan Lemming [name], RN Entered By: Regan Lemming on 03/03/2015 09:44:06 Brocksmith, [name] ([id number]) -------------------------------------------------------------------------------- Pain Assessment Details [name]. Date of Service: 03/03/2015 9:30 AM Medical Record Number: [id number] Patient Account Number: [id number] Date of Birth/Sex: [birth year] (59 y.o. Female) Treating RN: Baruch Gouty, RN, [name], [name] Primary Care Physician: Steele Sizer Other Clinician: Referring Physician: Steele Sizer Treating Physician/Extender: Frann Rider in Treatment: 10 Active Problems Location of Pain Severity and Description of Pain Patient Has Paino No Site Locations Pain Management and Medication Current Pain Management: Electronic Signature(s) Signed: 03/03/2015 9:36:01 AM By: Regan Lemming [name], RN Entered By: Regan Lemming on 03/03/2015 09:36:00 Ives, [name] ([id number]) -------------------------------------------------------------------------------- Wound Assessment Details [name]. Date of Service: 03/03/2015 9:30 AM Medical Record Number: [id number] Patient Account Number: [id number] Date of Birth/Sex: 1955-08-21 (59 y.o. Female) Treating RN: Baruch Gouty, RN, [name], [name] Primary Care Physician: Steele Sizer Other Clinician: Referring Physician: Steele Sizer Treating Physician/Extender: Frann Rider in Treatment: 10 Wound Status Wound Number: 1  Primary Pressure Ulcer Etiology: Wound Location: Right Back Wound Open Wounding Event: Blister Status: Date Acquired: 10/19/2013 Comorbid Glaucoma, Anemia, Coronary Artery Weeks Of Treatment: 10 History: Disease, Hypertension, Type II Clustered Wound: No Diabetes, End Stage Renal Disease, Osteoarthritis, Neuropathy Wound Measurements Length: (cm) 3.5 Width: (cm) 2.5 Depth: (cm) 0.1 Area: (cm) 6.872 Volume:  (cm) 0.687 % Reduction in Area: 82.1% % Reduction in Volume: 82.1% Epithelialization: Medium (34-66%) Tunneling: No Undermining: No Wound Description Classification: Category/Stage II Wound Margin: Distinct, outline attached Exudate Amount: Large Exudate Type: Serosanguineous Exudate Color: red, brown Foul Odor After Cleansing: No Wound Bed Granulation Amount: Large (67-100%) Exposed Structure Granulation Quality: Red, Pink, Friable Fascia Exposed: No Necrotic Amount: None Present (0%) Fat Layer Exposed: No Tendon Exposed: No Muscle Exposed: No Joint Exposed: No Bone Exposed: No Limited to Skin Breakdown Periwound Skin Texture Texture Color No Abnormalities Noted: No No Abnormalities Noted: No Callus: No Atrophie Blanche: No Crepitus: No Cyanosis: No Matheny, Henya S. ([id number]) Excoriation: No Ecchymosis: No Fluctuance: No Erythema: No Friable: No Hemosiderin Staining: No Induration: No Mottled: No Localized Edema: No Pallor: No Rash: No Rubor: No Scarring: No Temperature / Pain Moisture Temperature: No Abnormality No Abnormalities Noted: No Dry / Scaly: No Maceration: No Moist: Yes Wound Preparation Ulcer Cleansing: Rinsed/Irrigated with Saline Topical Anesthetic Applied: None Electronic Signature(s) Signed: 03/03/2015 9:41:59 AM By: Regan Lemming [name], RN Entered By: Regan Lemming on 03/03/2015 09:41:59 Torti, [name] ([id number]) -------------------------------------------------------------------------------- Vitals Details [name]. Date of Service: 03/03/2015 9:30 AM Medical Record Number: [id number] Patient Account Number: [id number] Date of Birth/Sex: 05-20-1956 (59 y.o. Female) Treating RN: [name], RN, [name], [place] Primary Care Physician: Steele Sizer Other Clinician: Referring Physician: Steele Sizer Treating Physician/Extender: Frann Rider in Treatment: 10 Vital Signs Time Taken: 09:35 Temperature (F):  98.0 Height (in): 65 Pulse (bpm): 75 Weight (lbs): 156 Respiratory Rate (breaths/min): 18 Body Mass Index (BMI): 26 Blood Pressure (mmHg): 185/73 Reference Range: 80 - 120 mg / dl Electronic Signature(s) Signed: 03/03/2015 9:37:11 AM By: Regan Lemming [name], RN Entered By: Regan Lemming on 03/03/2015 09:37:11

## 2015-03-10 ENCOUNTER — Encounter: Payer: Medicare Other | Admitting: Surgery

## 2015-03-10 DIAGNOSIS — L89112 Pressure ulcer of right upper back, stage 2: Secondary | ICD-10-CM | POA: Diagnosis not present

## 2015-03-11 NOTE — Progress Notes (Signed)
ALAZE, GARVERICK ([id number]) Visit Report for 03/10/2015 Arrival Information Details [name], [name]. Date of Service: 03/10/2015 1:45 PM Medical Record Number: [id number] Patient Account Number: [id number] Date of Birth/Sex: 1956-07-15 (59 y.o. Female) Treating RN: Montey Hora Primary Care Physician: [name] Other Clinician: Referring Physician: Steele [name] Treating Physician/Extender: [name] in Treatment: 11 Visit Information History Since Last Visit Added or deleted any medications: No Patient Arrived: Walker Any new allergies or adverse reactions: No Arrival Time: 13:56 Had a fall or experienced change in No Accompanied By: self activities of daily living that may affect Transfer Assistance: None risk of falls: Patient Identification Verified: Yes Signs or symptoms of abuse/neglect since last No Secondary Verification Process Yes visito Completed: Hospitalized since last visit: No Patient Requires Transmission- No Pain Present Now: No Based Precautions: Patient Has Alerts: Yes Patient Alerts: Patient on Blood Thinner ABI R: 1.28 Electronic Signature(s) Signed: 03/10/2015 5:20:03 PM By: Montey Hora Entered By: Montey Hora on 03/10/2015 13:56:52 Hubka, [name] ([id number]) -------------------------------------------------------------------------------- Clinic Level of Care Assessment Details [name]. Date of Service: 03/10/2015 1:45 PM Medical Record Number: [id number] Patient Account Number: [id number] Date of Birth/Sex: 10/23/1955 (59 y.o. Female) Treating RN: Montey Hora Primary Care Physician: [name] Other Clinician: Referring Physician: Steele [name] Treating Physician/Extender: [name] in Treatment: 11 Clinic Level of Care Assessment Items TOOL 4 Quantity Score []  - Use when only an EandM is performed on FOLLOW-UP visit 0 ASSESSMENTS - Nursing Assessment /  Reassessment X - Reassessment of Co-morbidities (includes updates in patient status) 1 10 X - Reassessment of Adherence to Treatment Plan 1 5 ASSESSMENTS - Wound and Skin Assessment / Reassessment []  - Simple Wound Assessment / Reassessment - one wound 0 X - Complex Wound Assessment / Reassessment - multiple wounds 2 5 []  - Dermatologic / Skin Assessment (not related to wound area) 0 ASSESSMENTS - Focused Assessment []  - Circumferential Edema Measurements - multi extremities 0 []  - Nutritional Assessment / Counseling / Intervention 0 []  - Lower Extremity Assessment (monofilament, tuning fork, pulses) 0 []  - Peripheral Arterial Disease Assessment (using hand held doppler) 0 ASSESSMENTS - Ostomy and/or Continence Assessment and Care []  - Incontinence Assessment and Management 0 []  - Ostomy Care Assessment and Management (repouching, etc.) 0 PROCESS - Coordination of Care X - Simple Patient / Family Education for ongoing care 1 15 []  - Complex (extensive) Patient / Family Education for ongoing care 0 []  - Staff obtains Programmer, systems, Records, Test Results / Process Orders 0 []  - Staff telephones HHA, Nursing Homes / Clarify orders / etc 0 []  - Routine Transfer to another Facility (non-emergent condition) 0 [name], [name] ([id number]) []  - Routine Hospital Admission (non-emergent condition) 0 []  - New Admissions / Biomedical engineer / Ordering NPWT, Apligraf, etc. 0 []  - Emergency Hospital Admission (emergent condition) 0 X - Simple Discharge Coordination 1 10 []  - Complex (extensive) Discharge Coordination 0 PROCESS - Special Needs []  - Pediatric / Minor Patient Management 0 []  - Isolation Patient Management 0 []  - Hearing / Language / Visual special needs 0 []  - Assessment of Community assistance (transportation, D/C planning, etc.) 0 []  - Additional assistance / Altered mentation 0 []  - Support Surface(s) Assessment (bed, cushion, seat, etc.) 0 INTERVENTIONS - Wound Cleansing /  Measurement []  - Simple Wound Cleansing - one wound 0 X - Complex Wound Cleansing - multiple wounds 2 5 X - Wound Imaging (photographs - any number of wounds) 1 5 []  -  Wound Tracing (instead of photographs) 0 []  - Simple Wound Measurement - one wound 0 X - Complex Wound Measurement - multiple wounds 2 5 INTERVENTIONS - Wound Dressings X - Small Wound Dressing one or multiple wounds 2 10 []  - Medium Wound Dressing one or multiple wounds 0 []  - Large Wound Dressing one or multiple wounds 0 []  - Application of Medications - topical 0 []  - Application of Medications - injection 0 INTERVENTIONS - Miscellaneous []  - External ear exam 0 [name], [name] ([id number]) []  - Specimen Collection (cultures, biopsies, blood, body fluids, etc.) 0 []  - Specimen(s) / Culture(s) sent or taken to Lab for analysis 0 []  - Patient Transfer (multiple staff / Harrel Lemon Lift / Similar devices) 0 []  - Simple Staple / Suture removal (25 or less) 0 []  - Complex Staple / Suture removal (26 or more) 0 []  - Hypo / Hyperglycemic Management (close monitor of Blood Glucose) 0 []  - Ankle / Brachial Index (ABI) - do not check if billed separately 0 X - Vital Signs 1 5 Has the patient been seen at the hospital within the last three years: Yes Total Score: 100 Level Of Care: New/Established - Level 3 Electronic Signature(s) Signed: 03/10/2015 5:20:03 PM By: Montey Hora Entered By: Montey Hora on 03/10/2015 14:42:45 [name], [name] ([id number]) -------------------------------------------------------------------------------- Encounter Discharge Information Details Patient Name: [name], [name] Date of Service: 03/10/2015 1:45 PM Medical Record Number: [id number] Patient Account Number: [id number] Date of Birth/Sex: Jun 18, 1956 (60 y.o. Female) Treating RN: Montey Hora Primary Care Physician: [name] Other Clinician: Referring Physician: Steele [name] Treating Physician/Extender: [name] in  Treatment: 11 Encounter Discharge Information Items Discharge Pain Level: 0 Discharge Condition: Stable Ambulatory Status: Walker Discharge Destination: Home Transportation: Private Auto Accompanied By: self Schedule Follow-up Appointment: Yes Medication Reconciliation completed No and provided to Patient/Care Madalyn Legner: Provided on Clinical Summary of Care: 03/10/2015 Form Type Recipient Paper Patient LD Electronic Signature(s) Signed: 03/10/2015 4:59:05 PM By: Montey Hora Previous Signature: 03/10/2015 2:32:27 PM Version By: Ruthine Dose Entered By: Montey Hora on 03/10/2015 16:59:05 Leppanen, [name] ([id number]) -------------------------------------------------------------------------------- Multi Wound Chart Details [name]. Date of Service: 03/10/2015 1:45 PM Medical Record Number: [id number] Patient Account Number: [id number] Date of Birth/Sex: September 18, 1955 (59 y.o. 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] [N/A:N/A] Comorbid History: [1:Glaucoma, Anemia, Coronary Artery Disease, Coronary Artery Disease, Hypertension, Type II Diabetes, End Stage Renal Disease, Osteoarthritis, Neuropathy Osteoarthritis, Neuropathy] [4:Glaucoma, Anemia, Hypertension, Type II Diabetes,  End Stage Renal Disease,] [N/A:N/A] Date Acquired: [1:10/19/2013] [4:03/07/2015] [N/A:N/A] Weeks of Treatment: [1:11] [4:0] [N/A:N/A] Wound Status:  [1:Open] [4:Open] [N/A:N/A] Measurements L x W x D 5.3x2.8x0.1 [4:5.7x6.5x0.1] [N/A:N/A] (cm) Area (cm) : [1:11.655] [4:29.099] [N/A:N/A] Volume (cm) : [1:1.166] [4:2.91] [N/A:N/A] % Reduction in Area: [1:69.70%] [4:0.00%] [N/A:N/A] % Reduction in Volume: 69.70% [4:0.00%] [N/A:N/A] Classification: [1:Category/Stage II] [4:Partial Thickness] [N/A:N/A] HBO Classification: [1:N/A] [4:Grade 0] [N/A:N/A] Exudate Amount: [1:Medium] [4:None Present] [N/A:N/A] Exudate Type: [1:Serosanguineous] [4:N/A] [N/A:N/A] Exudate Color: [1:red, brown] [4:N/A] [N/A:N/A] Wound Margin: [1:Distinct, outline attached Flat and Intact] [N/A:N/A] Granulation Amount: [1:Large (67-100%)] [4:None Present (0%)] [N/A:N/A] Granulation Quality: [1:Red, Pink, Friable] [4:N/A] [  N/A:N/A] Necrotic Amount: [1:None Present (0%)] [4:None Present (0%)] [N/A:N/A] Exposed Structures: [1:Fascia: No Fat: No] [4:Fascia: No Fat: No] [N/A:N/A] Tendon: No Tendon: No Muscle: No Muscle: No Joint: No Joint: No Bone: No Bone: No Limited to Skin Limited to Skin Breakdown Breakdown Epithelialization: Medium (34-66%) Large (67-100%) N/A Periwound Skin Texture: Edema: No Edema: No N/A Excoriation: No Excoriation: No Induration: No Induration: No Callus: No Callus: No Crepitus: No Crepitus: No Fluctuance: No Fluctuance: No Friable: No Friable: No Rash: No Rash: No Scarring: No Scarring: No Periwound Skin Moist: Yes Maceration: No N/A Moisture: Maceration: No Moist: No Dry/Scaly: No Dry/Scaly: No Periwound Skin Color: Atrophie Blanche: No Atrophie Blanche: No N/A Cyanosis: No Cyanosis: No Ecchymosis: No Ecchymosis: No Erythema: No Erythema: No Hemosiderin Staining: No Hemosiderin Staining: No Mottled: No Mottled: No Pallor: No Pallor: No Rubor: No Rubor: No Temperature: No Abnormality No Abnormality N/A Tenderness on No No N/A Palpation: Wound Preparation: Ulcer Cleansing: Ulcer Cleansing:  N/A Rinsed/Irrigated with Rinsed/Irrigated with Saline Saline Topical Anesthetic Topical Anesthetic Applied: None Applied: None Treatment Notes Electronic Signature(s) Signed: 03/10/2015 5:20:03 PM By: Montey Hora Entered By: Montey Hora on 03/10/2015 14:08:45 Burtt, [name] ([id number]) -------------------------------------------------------------------------------- [place] Details [name]. Date of Service: 03/10/2015 1:45 PM Medical Record Number: [id number] Patient Account Number: [id number] Date of Birth/Sex: [birth year] (59 y.o. Female) Treating RN: Montey Hora Primary Care Physician: [name] Other Clinician: Referring Physician: Steele [name] Treating Physician/Extender: [name] in Treatment: 11 Active Inactive Abuse / Safety / Falls / Self Care Management Nursing Diagnoses: Potential for falls Goals: Patient will remain injury free Date Initiated: 12/23/2014 Goal Status: Active Patient/caregiver will verbalize understanding of skin care regimen Date Initiated: 12/23/2014 Goal Status: Active Patient/caregiver will verbalize/demonstrate measures taken to prevent injury and/or falls Date Initiated: 12/23/2014 Goal Status: Active Patient/caregiver will verbalize/demonstrate understanding of what to do in case of emergency Date Initiated: 12/23/2014 Goal Status: Active Interventions: Assess fall risk on admission and as needed Assess: immobility, friction, shearing, incontinence upon admission and as needed Assess impairment of mobility on admission and as needed per policy Assess self care needs on admission and as needed Provide education on fall prevention Notes: Wound/Skin Impairment Nursing Diagnoses: Impaired tissue integrity Knowledge deficit related to ulceration/compromised skin integrity Goals: Patient/caregiver will verbalize understanding of skin care regimen [name], [name]  ([id number]) Date Initiated: 12/23/2014 Goal Status: Active Ulcer/skin breakdown will heal within 14 weeks Date Initiated: 12/23/2014 Goal Status: Active Interventions: Assess patient/caregiver ability to obtain necessary supplies Assess patient/caregiver ability to perform ulcer/skin care regimen upon admission and as needed Assess ulceration(s) every visit Provide education on ulcer and skin care Treatment Activities: Skin care regimen initiated : 03/10/2015 Topical wound management initiated : 03/10/2015 Notes: Electronic Signature(s) Signed: 03/10/2015 5:20:03 PM By: Montey Hora Entered By: Montey Hora on 03/10/2015 14:08:38 Califf, [name] ([id number]) -------------------------------------------------------------------------------- Patient/Caregiver Education Details [name]. Date of Service: 03/10/2015 1:45 PM Medical Record Number: [id number] Patient Account Number: [id number] Date of Birth/Gender: 1955-10-16 (59 y.o. Female) Treating RN: Montey Hora Primary Care Physician: [name] Other Clinician: Referring Physician: Steele [name] Treating Physician/Extender: [name] in Treatment: 11 Education Assessment Education Provided To: Patient Education Topics Provided Wound/Skin Impairment: Handouts: Other: wound care as ordered Methods: Demonstration, Explain/Verbal Responses: State content correctly Electronic Signature(s) Signed: 03/10/2015 4:59:26 PM By: Montey Hora Entered By: Montey Hora on 03/10/2015 16:59:26 Mckamey, [name] ([id number]) -------------------------------------------------------------------------------- Wound Assessment Details Patient Name: TMLYY, [name] Date of  Service: 03/10/2015 1:45 PM Medical Record Number: [id number] Patient Account Number: [id number] Date of Birth/Sex: 27-Jan-1956 (59 y.o. Female) Treating RN: Montey Hora Primary Care Physician: [name] Other Clinician: Referring  Physician: Steele [name] Treating Physician/Extender: [name] in Treatment: 11 Wound Status Wound Number: 1 Primary Pressure Ulcer Etiology: Wound Location: Right Back Wound Open Wounding Event: Blister Status: Date Acquired: 10/19/2013 Comorbid Glaucoma, Anemia, Coronary Artery Weeks Of Treatment: 11 History: Disease, Hypertension, Type II Clustered Wound: No Diabetes, End Stage Renal Disease, Osteoarthritis, Neuropathy Photos Photo Uploaded By: Gretta Cool, RN, [name], Kim on 03/10/2015 17:04:35 Wound Measurements Length: (cm) 5.3 Width: (cm) 2.8 Depth: (cm) 0.1 Area: (cm) 11.655 Volume: (cm) 1.166 % Reduction in Area: 69.7% % Reduction in Volume: 69.7% Epithelialization: Medium (34-66%) Tunneling: No Undermining: No Wound Description Classification: Category/Stage II Wound Margin: Distinct, outline attached Exudate Amount: Medium Exudate Type: Serosanguineous Exudate Color: red, brown Foul Odor After Cleansing: No Wound Bed Granulation Amount: Large (67-100%) Exposed Structure Granulation Quality: Red, Pink, Friable Fascia Exposed: No Necrotic Amount: None Present (0%) Fat Layer Exposed: No Carmean, [name] ([id number]) Tendon Exposed: No Muscle Exposed: No Joint Exposed: No Bone Exposed: No Limited to Skin Breakdown Periwound Skin Texture Texture Color No Abnormalities Noted: No No Abnormalities Noted: No Callus: No Atrophie Blanche: No Crepitus: No Cyanosis: No Excoriation: No Ecchymosis: No Fluctuance: No Erythema: No Friable: No Hemosiderin Staining: No Induration: No Mottled: No Localized Edema: No Pallor: No Rash: No Rubor: No Scarring: No Temperature / Pain Moisture Temperature: No Abnormality No Abnormalities Noted: No Dry / Scaly: No Maceration: No Moist: Yes Wound Preparation Ulcer Cleansing: Rinsed/Irrigated with Saline Topical Anesthetic Applied: None Treatment Notes Wound #1 (Right Back) 1. Cleansed with: Clean  wound with Normal Saline 3. Peri-wound Care: Skin Prep 4. Dressing Applied: Aquacel Ag Mepitel 5. Secondary Dressing Applied Bordered Foam Dressing Notes drawtex Electronic Signature(s) Signed: 03/10/2015 5:20:03 PM By: Montey Hora Entered By: Montey Hora on 03/10/2015 14:07:55 Kalas, [name] ([id number]) [place], [name] ([id number]) -------------------------------------------------------------------------------- Wound Assessment Details Patient Name: Suliman, [name] Date of Service: 03/10/2015 1:45 PM Medical Record Number: [id number] Patient Account Number: [id number] Date of Birth/Sex: April 09, 1956 (59 y.o. 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 - Anterior Wound Open Wounding Event: Blister Status: Date Acquired: 03/07/2015 Comorbid Glaucoma, Anemia, Coronary Artery Weeks Of Treatment: 0 History: Disease, Hypertension, Type II Clustered Wound: No Diabetes, End Stage Renal Disease, Osteoarthritis, Neuropathy Photos Photo Uploaded By: Gretta Cool, RN, [name], Kim on 03/10/2015 17:06:42 Wound Measurements Length: (cm) 5.7 Width: (cm) 6.5 Depth: (cm) 0.1 Area: (cm) 29.099 Volume: (cm) 2.91 % Reduction in Area: 0% % Reduction in Volume: 0% Epithelialization: Large (67-100%) Tunneling: No Undermining: No Wound Description Classification: Partial Thickness Foul Odor A Diabetic Severity (Wagner): Grade 0 Wound Margin: Flat and Intact Exudate Amount: None Present fter Cleansing: No Wound Bed Granulation Amount: None Present (0%) Exposed Structure Necrotic Amount: None Present (0%) Fascia Exposed: No Fat Layer Exposed: No Tendon Exposed: No Randon, Tamey S. ([id number]) Muscle Exposed: No Joint Exposed: No Bone Exposed:  No Limited to Skin Breakdown Periwound Skin Texture Texture Color No Abnormalities Noted: No No Abnormalities Noted: No Callus: No Atrophie Blanche: No Crepitus: No Cyanosis: No Excoriation: No Ecchymosis: No Fluctuance: No Erythema: No Friable: No Hemosiderin Staining: No Induration: No Mottled: No Localized Edema: No Pallor: No  Rash: No Rubor: No Scarring: No Temperature / Pain Moisture Temperature: No Abnormality No Abnormalities Noted: No Dry / Scaly: No Maceration: No Moist: No Wound Preparation Ulcer Cleansing: Rinsed/Irrigated with Saline Topical Anesthetic Applied: None Electronic Signature(s) Signed: 03/10/2015 5:20:03 PM By: Montey Hora Entered By: Montey Hora on 03/10/2015 14:08:28 Luellen, [name] ([id number]) -------------------------------------------------------------------------------- [place] Details [name], [name] Date of Service: 03/10/2015 1:45 PM Medical Record Number: [id number] Patient Account Number: [id number] Date of Birth/Sex: 07-07-1956 (59 y.o. Female) Treating RN: Montey Hora Primary Care Physician: [name] Other Clinician: Referring Physician: Steele [name] Treating Physician/Extender: [name] in Treatment: 11 Vital Signs Time Taken: 13:56 Temperature (F): 98.2 Height (in): 65 Pulse (bpm): 89 Weight (lbs): 156 Respiratory Rate (breaths/min): 18 Body Mass Index (BMI): 26 Blood Pressure (mmHg): 149/61 Reference Range: 80 - 120 mg / dl Electronic Signature(s) Signed: 03/10/2015 5:20:03 PM By: Montey Hora Entered By: Montey Hora on 03/10/2015 13:59:34

## 2015-03-11 NOTE — Progress Notes (Signed)
ERINE, PHENIX ([id number]) Visit Report for 03/10/2015 Chief Complaint Document Details [name], [name]. Date of Service: 03/10/2015 1:45 PM Medical Record Number: [id number] Patient Account Number: [id number] Date of Birth/Sex: [birth year] (59 y.o. Female) Treating RN: Montey Hora Primary Care Physician: Steele Sizer Other Clinician: Referring Physician: Steele Sizer Treating Physician/Extender: Frann Rider in Treatment: 11 Information Obtained from: Patient Chief Complaint Patient presents to the wound care center for a consult due non healing wound. She has an open wound on her right upper back which she's had for about a year and she recently noticed a blister on her right lower extremity about 2 weeks ago. Electronic Signature(s) Signed: 03/10/2015 2:19:35 PM By: [name], [name] Entered By: [name] on 03/10/2015 14:19:35 [name], [name] ([id number]) -------------------------------------------------------------------------------- HPI Details [name], [name]. Date of Service: 03/10/2015 1:45 PM Medical Record Number: [id number] Patient Account Number: [id number] Date of Birth/Sex: 04-09-1956 (59 y.o. Female) Treating RN: Montey Hora Primary Care Physician: Steele Sizer Other Clinician: Referring Physician: Steele Sizer Treating Physician/Extender: Frann Rider in Treatment: 11 History of Present Illness Location: right upper back and right lower extremity wounds Quality: Patient reports No Pain. Severity: Patient states wound (s) are getting better. Duration: Patient has had the wound for > 3 months prior to seeking treatment at the wound center Timing: she thought it first occurred when she was using a heating pad about a year ago after back surgery. Context: The wound appeared gradually over time Modifying Factors: Patient is currently on renal dialysis and receives treatments 3 times weekly Associated  Signs and Symptoms: Patient reports having: surgery scheduled for this week for a AV fistula left arm. HPI Description: 59 year old patient who is known to be a diabetic and has end-stage renal disease has had several comorbidities including coronary artery disease, hypertension, hyperlipidemia, pancreatitis, anemia, previous history of hysterectomy, cholecystectomy, left-sided salivary gland excision, bilateral cataract surgery,Peritoneal dialysis catheter, hemodialysis catheter. the area on the back has also been caused by instant pressure she used to sleep on a recliner all day and has significant kyphoscoliosis. As far as the wound on her right lower extremity she's not sure how this blister occurred but she thought it has been there for about 2 weeks. No recent blood investigations available and no recent hemoglobin A1c. 12/30/2014 -- she is an assisted living facility but I believe the nurses that have not followed instructions as she had some cream applied on her back and there was a different dressing. Last week she's had a AV fistula placed on her left forearm. 01/13/2015 -- she has had some localized infection at the port site and she's been on doxycycline for this. 02/05/2015 - he has developed a small blister on her right lower extremity. 02/10/2015 -- she has developed another small blister on her right anterior chest wall in the area where she's had tape for her dialysis access. this may just be injury caused by a tape burn. 02/18/2015 -- no new blisters and she had a dermatology opinion and they have taken a biopsy of her skin. She also had a left brachial AV fistula placed this week. 03/03/2015 -- though we do not have the pathology report yet the patient says she has been put on prednisone because this skin disease is possibly to do with her immune system and her dermatologist is recommended this. 03/10/2015 -- she has developed a new blister which is quite large on her right  lower extremity on the shin.  Electronic Signature(s) Signed: 03/10/2015 2:20:05 PM By: [name], [name] Scheel, [name] ([id number]) Entered By: [name] on 03/10/2015 14:20:05 [name], [name] ([id number]) -------------------------------------------------------------------------------- Physical Exam Details [name], [name] Date of Service: 03/10/2015 1:45 PM Medical Record Number: [id number] Patient Account Number: [id number] Date of Birth/Sex: 1955/10/09 (59 y.o. Female) Treating RN: Montey Hora Primary Care Physician: Steele Sizer Other Clinician: Referring Physician: Steele Sizer Treating Physician/Extender: Frann Rider in Treatment: 11 Constitutional . Pulse regular. Respirations normal and unlabored. Afebrile. . Eyes Nonicteric. Reactive to light. Ears, Nose, Mouth, and Throat Lips, teeth, and gums WNL.Marland Kitchen Moist mucosa without lesions . Neck supple and nontender. No palpable supraclavicular or cervical adenopathy. Normal sized without goiter. Respiratory WNL. No retractions.. Breath sounds WNL, No rubs, rales, rhonchi, or wheeze.. Cardiovascular Heart rhythm and rate regular, no murmur or gallop.. Pedal Pulses WNL. Chest Breast tissue WNL, no masses, lumps, or tenderness.. Lymphatic No adneopathy. No adenopathy. No adenopathy. Musculoskeletal Adexa without tenderness or enlargement.. Digits and nails w/o clubbing, cyanosis, infection, petechiae, ischemia, or inflammatory conditions.. Integumentary (Hair, Skin) No suspicious lesions. No crepitus or fluctuance. No peri-wound warmth or erythema. No masses.Marland Kitchen Psychiatric Judgement and insight Intact.. No evidence of depression, anxiety, or agitation.. Notes The wound on the back has grown healthy granulation tissue and is much smaller with the epithelialization at the edges. The blister on the right lower extremity has minimal fluid and we will apply an appropriate dressing over  this. Electronic Signature(s) Signed: 03/10/2015 2:21:10 PM By: [name], [name] Entered By: [name] on 03/10/2015 14:21:09 [name], [name] ([id number]) -------------------------------------------------------------------------------- Physician Orders Details [name]. Date of Service: 03/10/2015 1:45 PM Medical Record Number: [id number] Patient Account Number: [id number] Date of Birth/Sex: 18-Jul-1956 (59 y.o. Female) Treating RN: Montey Hora Primary Care Physician: Steele Sizer Other Clinician: Referring Physician: Steele Sizer Treating Physician/Extender: Frann Rider in Treatment: 11 Verbal / Phone Orders: Yes Clinician: Montey Hora Read Back and Verified: Yes Diagnosis Coding Wound Cleansing Wound #1 Right Back o Clean wound with Normal Saline. Wound #4 Right,Anterior Lower Leg o Clean wound with Normal Saline. Skin Barriers/Peri-Wound Care Wound #1 Right Back o Skin Prep Primary Wound Dressing Wound #1 Right Back o Aquacel Ag o Drawtex o Mepitel One Wound #4 Right,Anterior Lower Leg o Mepitel One Secondary Dressing Wound #1 Right Back o Boardered Foam Dressing Wound #4 Right,Anterior Lower Leg o Boardered Foam Dressing Dressing Change Frequency Wound #1 Right Back o Change dressing every week - Please keep dressing in place. can change outer with excess drainage prn. Wound #4 Right,Anterior Lower Leg o Change dressing every week - Please keep dressing in place. can change outer with excess drainage prn. DULCEMARIA, BULA ([id number]) Follow-up Appointments Wound #1 Right Back o Return Appointment in 1 week. Wound #4 Right,Anterior Lower Leg o Return Appointment in 1 week. Electronic Signature(s) Signed: 03/10/2015 4:00:25 PM By: [name], [name] Signed: 03/10/2015 5:20:03 PM By: Montey Hora Entered By: Montey Hora on 03/10/2015 14:18:50 Shannahan, [name]  ([id number]) -------------------------------------------------------------------------------- Problem List Details [name], [name] Date of Service: 03/10/2015 1:45 PM Medical Record Number: [id number] Patient Account Number: [id number] Date of Birth/Sex: 18-May-1956 (59 y.o. Female) Treating RN: Montey Hora Primary Care Physician: Steele Sizer Other Clinician: Referring Physician: Steele Sizer Treating Physician/Extender: Frann Rider in Treatment: 11 Active Problems ICD-10 Encounter Code Description Active Date Diagnosis E11.622 Type 2 diabetes mellitus with other skin ulcer 12/23/2014 Yes L89.112 Pressure ulcer of  right upper back, stage 2 12/23/2014 Yes S80.821A Blister (nonthermal), right lower leg, initial encounter 12/23/2014 Yes N18.6 End stage renal disease 12/23/2014 Yes Inactive Problems Resolved Problems Electronic Signature(s) Signed: 03/10/2015 2:19:29 PM By: [name], [name] Entered By: [name] on 03/10/2015 14:19:29 [name], [name] -------------------------------------------------------------------------------- Progress Note Details [name], [name] Date of Service: 03/10/2015 1:45 PM Medical Record Number: [id number] Patient Account Number: [id number] Date of Birth/Sex: May 06, 1956 (59 y.o. Female) Treating RN: Montey Hora Primary Care Physician: Steele Sizer Other Clinician: Referring Physician: Steele Sizer Treating Physician/Extender: Frann Rider in Treatment: 11 Subjective Chief Complaint Information obtained from Patient Patient presents to the wound care center for a consult due non healing wound. She has an open wound on her right upper back which she's had for about a year and she recently noticed a blister on her right lower extremity about 2 weeks ago. History of Present Illness (HPI) The following HPI elements were documented for the patient's wound: Location: right upper back  and right lower extremity wounds Quality: Patient reports No Pain. Severity: Patient states wound (s) are getting better. Duration: Patient has had the wound for > 3 months prior to seeking treatment at the wound center Timing: she thought it first occurred when she was using a heating pad about a year ago after back surgery. Context: The wound appeared gradually over time Modifying Factors: Patient is currently on renal dialysis and receives treatments 3 times weekly Associated Signs and Symptoms: Patient reports having: surgery scheduled for this week for a AV fistula left arm. 59 year old patient who is known to be a diabetic and has end-stage renal disease has had several comorbidities including coronary artery disease, hypertension, hyperlipidemia, pancreatitis, anemia, previous history of hysterectomy, cholecystectomy, left-sided salivary gland excision, bilateral cataract surgery,Peritoneal dialysis catheter, hemodialysis catheter. the area on the back has also been caused by instant pressure she used to sleep on a recliner all day and has significant kyphoscoliosis. As far as the wound on her right lower extremity she's not sure how this blister occurred but she thought it has been there for about 2 weeks. No recent blood investigations available and no recent hemoglobin A1c. 12/30/2014 -- she is an assisted living facility but I believe the nurses that have not followed instructions as she had some cream applied on her back and there was a different dressing. Last week she's had a AV fistula placed on her left forearm. 01/13/2015 -- she has had some localized infection at the port site and she's been on doxycycline for this. 02/05/2015 - he has developed a small blister on her right lower extremity. 02/10/2015 -- she has developed another small blister on her right anterior chest wall in the area where she's had tape for her dialysis access. this may just be injury caused by a tape  burn. 02/18/2015 -- no new blisters and she had a dermatology opinion and they have taken a biopsy of her skin. [name], [name] ([id number]) She also had a left brachial AV fistula placed this week. 03/03/2015 -- though we do not have the pathology report yet the patient says she has been put on prednisone because this skin disease is possibly to do with her immune system and her dermatologist is recommended this. 03/10/2015 -- she has developed a new blister which is quite large on her right lower extremity on the shin. Objective Constitutional Pulse regular. Respirations normal and unlabored. Afebrile. Vitals Time Taken: 1:56 PM, Height: 65 in, Weight:  156 lbs, BMI: 26, Temperature: 98.2 F, Pulse: 89 bpm, Respiratory Rate: 18 breaths/min, Blood Pressure: 149/61 mmHg. Eyes Nonicteric. Reactive to light. Ears, Nose, Mouth, and Throat Lips, teeth, and gums WNL.Marland Kitchen Moist mucosa without lesions . Neck supple and nontender. No palpable supraclavicular or cervical adenopathy. Normal sized without goiter. Respiratory WNL. No retractions.. Breath sounds WNL, No rubs, rales, rhonchi, or wheeze.. Cardiovascular Heart rhythm and rate regular, no murmur or gallop.. Pedal Pulses WNL. Chest Breast tissue WNL, no masses, lumps, or tenderness.. Lymphatic No adneopathy. No adenopathy. No adenopathy. Musculoskeletal Adexa without tenderness or enlargement.. Digits and nails w/o clubbing, cyanosis, infection, petechiae, ischemia, or inflammatory conditions.Marland Kitchen Psychiatric Judgement and insight Intact.. No evidence of depression, anxiety, or agitation.. General Notes: The wound on the back has grown healthy granulation tissue and is much smaller with the Sisney, Kyrah S. ([id number]) epithelialization at the edges. The blister on the right lower extremity has minimal fluid and we will apply an appropriate dressing over this. Integumentary (Hair, Skin) No suspicious lesions. No crepitus or fluctuance. No  peri-wound warmth or erythema. No masses.. Wound #1 status is Open. Original cause of wound was Blister. The wound is located on the Right Back. The wound measures 5.3cm length x 2.8cm width x 0.1cm depth; 11.655cm^2 area and 1.166cm^3 volume. The wound is limited to skin breakdown. There is no tunneling or undermining noted. There is a medium amount of serosanguineous drainage noted. The wound margin is distinct with the outline attached to the wound base. There is large (67-100%) red, pink, friable granulation within the wound bed. There is no necrotic tissue within the wound bed. The periwound skin appearance exhibited: Moist. The periwound skin appearance did not exhibit: Callus, Crepitus, Excoriation, Fluctuance, Friable, Induration, Localized Edema, Rash, Scarring, Dry/Scaly, Maceration, Atrophie Blanche, Cyanosis, Ecchymosis, Hemosiderin Staining, Mottled, Pallor, Rubor, Erythema. Periwound temperature was noted as No Abnormality. Wound #4 status is Open. Original cause of wound was Blister. The wound is located on the Right,Anterior Lower Leg. The wound measures 5.7cm length x 6.5cm width x 0.1cm depth; 29.099cm^2 area and 2.91cm^3 volume. The wound is limited to skin breakdown. There is no tunneling or undermining noted. There is a none present amount of drainage noted. The wound margin is flat and intact. There is no granulation within the wound bed. There is no necrotic tissue within the wound bed. The periwound skin appearance did not exhibit: Callus, Crepitus, Excoriation, Fluctuance, Friable, Induration, Localized Edema, Rash, Scarring, Dry/Scaly, Maceration, Moist, Atrophie Blanche, Cyanosis, Ecchymosis, Hemosiderin Staining, Mottled, Pallor, Rubor, Erythema. Periwound temperature was noted as No Abnormality. Assessment Active Problems ICD-10 E11.622 - Type 2 diabetes mellitus with other skin ulcer L89.112 - Pressure ulcer of right upper back, stage 2 S80.821A - Blister  (nonthermal), right lower leg, initial encounter N18.6 - End stage renal disease I will use Mepitel and then a layer of silver alginate and Drawtex to help control the fluid oozing through the raw surface. She is urged to keep this on for a week and come back and see me next week. On the right lower extremity will use a piece of Mepitel and a bordered foam dressing and leave this alone for a week. CHARLOTTIE, PERAGINE ([id number]) Plan Wound Cleansing: Wound #1 Right Back: Clean wound with Normal Saline. Wound #4 Right,Anterior Lower Leg: Clean wound with Normal Saline. Skin Barriers/Peri-Wound Care: Wound #1 Right Back: Skin Prep Primary Wound Dressing: Wound #1 Right Back: Aquacel Ag Drawtex Mepitel One Wound #4 Right,Anterior Lower Leg: Mepitel One Secondary  Dressing: Wound #1 Right Back: Boardered Foam Dressing Wound #4 Right,Anterior Lower Leg: Boardered Foam Dressing Dressing Change Frequency: Wound #1 Right Back: Change dressing every week - Please keep dressing in place. can change outer with excess drainage prn. Wound #4 Right,Anterior Lower Leg: Change dressing every week - Please keep dressing in place. can change outer with excess drainage prn. Follow-up Appointments: Wound #1 Right Back: Return Appointment in 1 week. Wound #4 Right,Anterior Lower Leg: Return Appointment in 1 week. I will use Mepitel and then a layer of silver alginate and Drawtex to help control the fluid oozing through the raw surface. She is urged to keep this on for a week and come back and see me next week. On the right lower extremity will use a piece of Mepitel and a bordered foam dressing and leave this alone for a week. Electronic Signature(s) Signed: 03/10/2015 2:22:46 PM By: [name], [name] [name], [name] ([id number]) Entered By: [name] on 03/10/2015 14:22:45 Schlotter, [name]  ([id number]) -------------------------------------------------------------------------------- SuperBill Details Patient Name: SIENA, POEHLER S. Date of Service: 03/10/2015 Medical Record Number: [id number] Patient Account Number: [id number] Date of Birth/Sex: 1956/02/02 (59 y.o. Female) Treating RN: Montey Hora Primary Care Physician: Steele Sizer Other Clinician: Referring Physician: Steele Sizer Treating Physician/Extender: Frann Rider in Treatment: 11 Diagnosis Coding ICD-10 Codes Code Description E11.622 Type 2 diabetes mellitus with other skin ulcer L89.112 Pressure ulcer of right upper back, stage 2 S80.821A Blister (nonthermal), right lower leg, initial encounter N18.6 End stage renal disease Facility Procedures CPT4 Code: [id number] Description: 99213 - WOUND CARE VISIT-LEV 3 EST PT Modifier: Quantity: 1 Physician Procedures CPT4 Code: [id number] Description: 15176 - WC PHYS LEVEL 3 - EST PT ICD-10 Description Diagnosis E11.622 Type 2 diabetes mellitus with other skin ulcer L89.112 Pressure ulcer of right upper back, stage 2 S80.821A Blister (nonthermal), right lower leg, initial enc N18.6 End  stage renal disease Modifier: ounter Quantity: 1 Electronic Signature(s) Signed: 03/10/2015 4:00:25 PM By: [name], [name] Signed: 03/10/2015 5:20:03 PM By: Montey Hora Previous Signature: 03/10/2015 2:23:03 PM Version By: [name], [name] Entered By: Montey Hora on 03/10/2015 14:43:00

## 2015-03-17 ENCOUNTER — Encounter: Payer: Self-pay | Admitting: General Surgery

## 2015-03-17 ENCOUNTER — Encounter (HOSPITAL_BASED_OUTPATIENT_CLINIC_OR_DEPARTMENT_OTHER): Payer: Medicare Other | Admitting: General Surgery

## 2015-03-17 DIAGNOSIS — L89112 Pressure ulcer of right upper back, stage 2: Secondary | ICD-10-CM | POA: Diagnosis not present

## 2015-03-17 NOTE — Progress Notes (Addendum)
SONI, KEGEL ([id number]) Visit Report for 03/17/2015 Arrival Information Details [name]. Date of Service: 03/17/2015 11:45 AM Medical Record Number: [id number] Patient Account Number: [id number] Date of Birth/Sex: 18-Nov-1955 (59 y.o. Female) Treating RN: Baruch Gouty, RN, [name], [name] Primary Care Physician: Steele Sizer Other Clinician: Referring Physician: Steele Sizer Treating Physician/Extender: Benjaman Pott in Treatment: 12 Visit Information History Since Last Visit Any new allergies or adverse reactions: No Patient Arrived: [name] Had a fall or experienced change in No Arrival Time: 11:27 activities of daily living that may affect Accompanied By: self risk of falls: Transfer Assistance: None Signs or symptoms of abuse/neglect since last No Patient Identification Verified: Yes visito Secondary Verification Process Yes Hospitalized since last visit: No Completed: Has Dressing in Place as Prescribed: Yes Patient Requires Transmission- No Pain Present Now: No Based Precautions: Patient Has Alerts: Yes Patient Alerts: Patient on Blood Thinner ABI R: 1.28 Electronic Signature(s) Signed: 03/17/2015 11:28:01 AM By: Regan Lemming [name], RN Entered By: Regan Lemming on 03/17/2015 11:28:00 [name], [name] ([id number]) -------------------------------------------------------------------------------- Clinic Level of Care Assessment Details [name]. Date of Service: 03/17/2015 11:45 AM Medical Record Number: [id number] Patient Account Number: [id number] Date of Birth/Sex: [birth year] (59 y.o. Female) Treating RN: [name], RN, [name], [place] Primary Care Physician: Steele Sizer Other Clinician: Referring Physician: Steele Sizer Treating Physician/Extender: Benjaman Pott in Treatment: 12 Clinic Level of Care Assessment Items TOOL 4 Quantity Score []  - Use when only an EandM is performed on FOLLOW-UP visit 0 ASSESSMENTS - Nursing  Assessment / Reassessment X - Reassessment of Co-morbidities (includes updates in patient status) 1 10 X - Reassessment of Adherence to Treatment Plan 1 5 ASSESSMENTS - Wound and Skin Assessment / Reassessment []  - Simple Wound Assessment / Reassessment - one wound 0 X - Complex Wound Assessment / Reassessment - multiple wounds 2 5 []  - Dermatologic / Skin Assessment (not related to wound area) 0 ASSESSMENTS - Focused Assessment []  - Circumferential Edema Measurements - multi extremities 0 []  - Nutritional Assessment / Counseling / Intervention 0 []  - Lower Extremity Assessment (monofilament, tuning fork, pulses) 0 []  - Peripheral Arterial Disease Assessment (using hand held doppler) 0 ASSESSMENTS - Ostomy and/or Continence Assessment and Care []  - Incontinence Assessment and Management 0 []  - Ostomy Care Assessment and Management (repouching, etc.) 0 PROCESS - Coordination of Care X - Simple Patient / Family Education for ongoing care 1 15 []  - Complex (extensive) Patient / Family Education for ongoing care 0 []  - Staff obtains Programmer, systems, Records, Test Results / Process Orders 0 []  - Staff telephones HHA, Nursing Homes / Clarify orders / etc 0 []  - Routine Transfer to another Facility (non-emergent condition) 0 [name], [name] ([id number]) []  - Routine Hospital Admission (non-emergent condition) 0 []  - New Admissions / Biomedical engineer / Ordering NPWT, Apligraf, etc. 0 []  - Emergency Hospital Admission (emergent condition) 0 []  - Simple Discharge Coordination 0 []  - Complex (extensive) Discharge Coordination 0 PROCESS - Special Needs []  - Pediatric / Minor Patient Management 0 []  - Isolation Patient Management 0 []  - Hearing / Language / Visual special needs 0 []  - Assessment of Community assistance (transportation, D/C planning, etc.) 0 []  - Additional assistance / Altered mentation 0 []  - Support Surface(s) Assessment (bed, cushion, seat, etc.) 0 INTERVENTIONS - Wound  Cleansing / Measurement []  - Simple Wound Cleansing - one wound 0 X - Complex Wound Cleansing - multiple wounds 2 5 X - Wound Imaging (photographs -  any number of wounds) 1 5 []  - Wound Tracing (instead of photographs) 0 []  - Simple Wound Measurement - one wound 0 X - Complex Wound Measurement - multiple wounds 2 5 INTERVENTIONS - Wound Dressings X - Small Wound Dressing one or multiple wounds 2 10 []  - Medium Wound Dressing one or multiple wounds 0 []  - Large Wound Dressing one or multiple wounds 0 []  - Application of Medications - topical 0 []  - Application of Medications - injection 0 INTERVENTIONS - Miscellaneous []  - External ear exam 0 [name], [name] ([id number]) []  - Specimen Collection (cultures, biopsies, blood, body fluids, etc.) 0 []  - Specimen(s) / Culture(s) sent or taken to Lab for analysis 0 []  - Patient Transfer (multiple staff / Harrel Lemon Lift / Similar devices) 0 []  - Simple Staple / Suture removal (25 or less) 0 []  - Complex Staple / Suture removal (26 or more) 0 []  - Hypo / Hyperglycemic Management (close monitor of Blood Glucose) 0 []  - Ankle / Brachial Index (ABI) - do not check if billed separately 0 X - Vital Signs 1 5 Has the patient been seen at the hospital within the last three years: Yes Total Score: 90 Level Of Care: New/Established - Level 3 Electronic Signature(s) Signed: 03/17/2015 11:51:03 AM By: Regan Lemming [name], RN Entered By: Regan Lemming on 03/17/2015 11:51:01 [name], [name] ([id number]) -------------------------------------------------------------------------------- Encounter Discharge Information Details [name]. Date of Service: 03/17/2015 11:45 AM Medical Record Number: [id number] Patient Account Number: [id number] Date of Birth/Sex: 01/26/1956 (59 y.o. Female) Treating RN: Baruch Gouty, RN, [name], [name] Primary Care Physician: Steele Sizer Other Clinician: Referring Physician: Steele Sizer Treating Physician/Extender: Benjaman Pott in Treatment: 12 Encounter Discharge Information Items Discharge Pain Level: 0 Discharge Condition: Stable Ambulatory Status: Walker Discharge Destination: Home Transportation: Private Auto Accompanied By: self Schedule Follow-up Appointment: No Medication Reconciliation completed No and provided to Patient/Care Lief Palmatier: Provided on Clinical Summary of Care: 03/17/2015 Form Type Recipient Paper Patient LD Electronic Signature(s) Signed: 03/17/2015 12:16:07 PM By: Judene Companion MD Previous Signature: 03/17/2015 11:51:52 AM Version By: Regan Lemming [name], RN Previous Signature: 03/17/2015 11:49:50 AM Version By: Ruthine Dose Entered By: Judene Companion on 03/17/2015 12:16:07 Pennisi, [name] ([id number]) -------------------------------------------------------------------------------- Lower Extremity Assessment Details Patient Name: Chandler, Bernetha S. Date of Service: 03/17/2015 11:45 AM Medical Record Number: [id number] Patient Account Number: [id number] Date of Birth/Sex: 1956/04/04 (59 y.o. Female) Treating RN: Baruch Gouty, RN, [name], [name] Primary Care Physician: Steele Sizer Other Clinician: Referring Physician: Steele Sizer Treating Physician/Extender: Benjaman Pott in Treatment: 12 Edema Assessment Assessed: [Left: No] [Right: No] Edema: [Left: N] [Right: o] Vascular Assessment Pulses: Posterior Tibial Dorsalis Pedis Palpable: [Right:Yes] Extremity colors, hair growth, and conditions: Extremity Color: [Right:Normal] Hair Growth on Extremity: [Right:Yes] Temperature of Extremity: [Right:Warm] Capillary Refill: [Right:< 3 seconds] Toe Nail Assessment Left: Right: Thick: No Discolored: No Deformed: No Improper Length and Hygiene: No Electronic Signature(s) Signed: 03/17/2015 11:32:07 AM By: Regan Lemming [name], RN Entered By: Regan Lemming on 03/17/2015 11:32:07 Safranek, [name]  ([id number]) -------------------------------------------------------------------------------- Multi Wound Chart Details Patient Name: ZDGUY, Jesyca S. Date of Service: 03/17/2015 11:45 AM Medical Record Number: [id number] Patient Account Number: [id number] Date of Birth/Sex: 06/28/1956 (59 y.o. 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] [N/A:N/A] Comorbid History: [1:Glaucoma, Anemia, Coronary Artery Disease, Coronary Artery Disease, Hypertension, Type II Diabetes, End Stage Renal Disease, Osteoarthritis, Neuropathy Osteoarthritis, Neuropathy] [4:Glaucoma, Anemia, Hypertension, Type II Diabetes,  End Stage Renal Disease,] [N/A:N/A] Date Acquired: [1:10/19/2013] [4:03/07/2015] [N/A:N/A] Weeks of Treatment: [1:12] [4:1] [N/A:N/A] Wound Status: [1:Open] [4:Open] [N/A:N/A] Measurements L x W x D 3.5x2.5x0.1 [4:6x3x0.1] [N/A:N/A] (cm) Area (cm) : [1:6.872] [4:14.137] [N/A:N/A] Volume (cm) : [1:0.687] [4:1.414] [N/A:N/A] % Reduction in Area: [1:82.10%] [4:51.40%] [N/A:N/A] % Reduction in Volume: 82.10% [4:51.40%] [N/A:N/A] Classification: [1:Category/Stage II] [4:Partial Thickness] [N/A:N/A] HBO Classification: [1:N/A] [4:Grade 0] [N/A:N/A] Exudate Amount: [1:Medium] [4:None Present] [N/A:N/A] Exudate Type: [1:Serosanguineous] [4:N/A] [N/A:N/A] Exudate Color: [1:red, brown] [4:N/A] [N/A:N/A] Wound Margin: [1:Distinct, outline attached Flat and  Intact] [N/A:N/A] Granulation Amount: [1:Large (67-100%)] [4:None Present (0%)] [N/A:N/A] Granulation Quality: [1:Red, Pink, Friable] [4:N/A] [N/A:N/A] Necrotic Amount: [1:None Present (0%)] [4:None Present (0%)] [N/A:N/A] Exposed Structures: [1:Fascia: No Fat: No] [4:Fascia: No Fat: No] [N/A:N/A] Tendon: No Tendon: No Muscle: No Muscle: No Joint: No Joint: No Bone: No Bone: No Limited to Skin Limited to Skin Breakdown Breakdown Epithelialization: Medium (34-66%) Large (67-100%) N/A Periwound Skin Texture: Edema: No Edema: No N/A Excoriation: No Excoriation: No Induration: No Induration: No Callus: No Callus: No Crepitus: No Crepitus: No Fluctuance: No Fluctuance: No Friable: No Friable: No Rash: No Rash: No Scarring: No Scarring: No Periwound Skin Moist: Yes Maceration: No N/A Moisture: Maceration: No Moist: No Dry/Scaly: No Dry/Scaly: No Periwound Skin Color: Atrophie Blanche: No Atrophie Blanche: No N/A Cyanosis: No Cyanosis: No Ecchymosis: No Ecchymosis: No Erythema: No Erythema: No Hemosiderin Staining: No Hemosiderin Staining: No Mottled: No Mottled: No Pallor: No Pallor: No Rubor: No Rubor: No Temperature: No Abnormality No Abnormality N/A Tenderness on No No N/A Palpation: Wound Preparation: Ulcer Cleansing: Ulcer Cleansing: N/A Rinsed/Irrigated with Rinsed/Irrigated with Saline Saline Topical Anesthetic Topical Anesthetic Applied: None Applied: None Treatment Notes Electronic Signature(s) Signed: 03/17/2015 11:42:57 AM By: Regan Lemming [name], RN Entered By: Regan Lemming on 03/17/2015 11:42:57 Windhorst, [name] ([id number]) -------------------------------------------------------------------------------- [place] Details [name]. Date of Service: 03/17/2015 11:45 AM Medical Record Number: [id number] Patient Account Number: [id number] Date of Birth/Sex: May 23, 1956 (59 y.o. Female) Treating RN: Baruch Gouty, RN,  [name], [name] Primary Care Physician: Steele Sizer Other Clinician: Referring Physician: Steele Sizer Treating Physician/Extender: Benjaman Pott in Treatment: 12 Active Inactive Abuse / Safety / Falls / Self Care Management Nursing Diagnoses: Potential for falls Goals: Patient will remain injury free Date Initiated: 12/23/2014 Goal Status: Active Patient/caregiver will verbalize understanding of skin care regimen Date Initiated: 12/23/2014 Goal Status: Active Patient/caregiver will verbalize/demonstrate measures taken to prevent injury and/or falls Date Initiated: 12/23/2014 Goal Status: Active Patient/caregiver will verbalize/demonstrate understanding of what to do in case of emergency Date Initiated: 12/23/2014 Goal Status: Active Interventions: Assess fall risk on admission and as needed Assess: immobility, friction, shearing, incontinence upon admission and as needed Assess impairment of mobility on admission and as needed per policy Assess self care needs on admission and as needed Provide education on fall prevention Notes: Wound/Skin Impairment Nursing Diagnoses: Impaired tissue integrity Knowledge deficit related to ulceration/compromised skin integrity Goals: Patient/caregiver will verbalize understanding of skin care regimen DORIAN, RENFRO ([id number]) Date Initiated: 12/23/2014 Goal Status: Active Ulcer/skin breakdown will heal within 14 weeks Date Initiated: 12/23/2014 Goal Status: Active Interventions: Assess patient/caregiver ability to obtain necessary  supplies Assess patient/caregiver ability to perform ulcer/skin care regimen upon admission and as needed Assess ulceration(s) every visit Provide education on ulcer and skin care Treatment Activities: Skin care regimen initiated : 03/17/2015 Topical wound management initiated : 03/17/2015 Notes: Electronic Signature(s) Signed: 03/17/2015 11:42:49 AM By: Regan Lemming [name], RN Entered By: Regan Lemming on  03/17/2015 11:42:48 Galla, Jinan S. ([id number]) -------------------------------------------------------------------------------- Pain Assessment Details [name]. Date of Service: 03/17/2015 11:45 AM Medical Record Number: [id number] Patient Account Number: [id number] Date of Birth/Sex: 1955/12/06 (59 y.o. Female) Treating RN: Baruch Gouty, RN, [name], [name] Primary Care Physician: Steele Sizer Other Clinician: Referring Physician: Steele Sizer Treating Physician/Extender: Benjaman Pott in Treatment: 12 Active Problems Location of Pain Severity and Description of Pain Patient Has Paino Yes Site Locations Pain Location: Generalized Pain Rate the pain. Current Pain Level: 4 Character of Pain Describe the Pain: Tender Pain Management and Medication Current Pain Management: How does your pain impact your activities of daily livingo Sleep: Yes Bathing: Yes Appetite: Yes Relationship With Others: Yes Bladder Continence: Yes Emotions: Yes Bowel Continence: Yes Work: Yes Toileting: Yes Drive: Yes Dressing: Yes Hobbies: Yes Electronic Signature(s) Signed: 03/17/2015 11:28:17 AM By: Regan Lemming [name], RN Entered By: Regan Lemming on 03/17/2015 11:28:17 Habermehl, [name] ([id number]) -------------------------------------------------------------------------------- Patient/Caregiver Education Details [name]. Date of Service: 03/17/2015 11:45 AM Medical Record Number: [id number] Patient Account Number: [id number] Date of Birth/Gender: 09/12/1955 (59 y.o. Female) Treating RN: Baruch Gouty, RN, [name], [name] Primary Care Physician: Steele Sizer Other Clinician: Referring Physician: Steele Sizer Treating Physician/Extender: Benjaman Pott in Treatment: 12 Education Assessment Education Provided To: Patient Education Topics Provided Basic Hygiene: Methods: Explain/Verbal Responses: State content correctly Safety: Methods:  Explain/Verbal Responses: State content correctly Wound/Skin Impairment: Methods: Explain/Verbal Responses: State content correctly Electronic Signature(s) Signed: 03/17/2015 12:16:18 PM By: Judene Companion MD Previous Signature: 03/17/2015 11:52:10 AM Version By: Regan Lemming [name], RN Entered By: Judene Companion on 03/17/2015 12:16:18 Herbig, [name] ([id number]) -------------------------------------------------------------------------------- Wound Assessment Details Patient Name: Gienger, Collins S. Date of Service: 03/17/2015 11:45 AM Medical Record Number: [id number] Patient Account Number: [id number] Date of Birth/Sex: 04/05/1956 (59 y.o. Female) Treating RN: [name], RN, [name], [place] Primary Care Physician: Steele Sizer Other Clinician: Referring Physician: Steele Sizer Treating Physician/Extender: Benjaman Pott in Treatment: 12 Wound Status Wound Number: 1 Primary Pressure Ulcer Etiology: Wound Location: Right Back Wound Open Wounding Event: Blister Status: Date Acquired: 10/19/2013 Comorbid Glaucoma, Anemia, Coronary Artery Weeks Of Treatment: 12 History: Disease, Hypertension, Type II Clustered Wound: No Diabetes, End Stage Renal Disease, Osteoarthritis, Neuropathy Photos Wound Measurements Length: (cm) 3.5 Width: (cm) 2.5 Depth: (cm) 0.1 Area: (cm) 6.872 Volume: (cm) 0.687 % Reduction in Area: 82.1% % Reduction in Volume: 82.1% Epithelialization: Medium (34-66%) Tunneling: No Undermining: No Wound Description Classification: Category/Stage II Wound Margin: Distinct, outline attached Exudate Amount: Medium Exudate Type: Serosanguineous Exudate Color: red, brown Foul Odor After Cleansing: No Wound Bed Granulation Amount: Large (67-100%) Exposed Structure Granulation Quality: Red, Pink, Friable Fascia Exposed: No Necrotic Amount: None Present (0%) Fat Layer Exposed: No Tendon Exposed: No Disbro, Elisa S. ([id number]) Muscle Exposed: No Joint Exposed:  No Bone Exposed: No Limited to Skin Breakdown Periwound Skin Texture Texture Color No Abnormalities Noted: No No Abnormalities Noted: No Callus: No Atrophie Blanche: No Crepitus: No Cyanosis: No Excoriation: No Ecchymosis: No Fluctuance: No Erythema: No Friable: No Hemosiderin Staining: No Induration: No Mottled: No Localized Edema: No Pallor: No Rash: No Rubor: No Scarring: No Temperature / Pain Moisture  Temperature: No Abnormality No Abnormalities Noted: No Dry / Scaly: No Maceration: No Moist: Yes Wound Preparation Ulcer Cleansing: Rinsed/Irrigated with Saline Topical Anesthetic Applied: None Treatment Notes Wound #1 (Right Back) 1. Cleansed with: Clean wound with Normal Saline 3. Peri-wound Care: Skin Prep 4. Dressing Applied: Aquacel Ag Mepitel 5. Secondary Dressing Applied Bordered Foam Dressing Notes drawtex Electronic Signature(s) Signed: 03/18/2015 3:58:12 PM By: Regan Lemming [name], RN Previous Signature: 03/17/2015 11:35:56 AM Version By: Regan Lemming [name], RN Entered By: Regan Lemming on 03/18/2015 15:58:12 Putzier, [name] ([id number]) Moler, [name] ([id number]) -------------------------------------------------------------------------------- Wound Assessment Details Patient Name: Ossa, Juliane S. Date of Service: 03/17/2015 11:45 AM Medical Record Number: [id number] Patient Account Number: [id number] Date of Birth/Sex: April 06, 1956 (59 y.o. Female) Treating RN: [name], RN, [name], [place] Primary Care Physician: Steele Sizer Other Clinician: Referring Physician: Steele Sizer Treating Physician/Extender: Benjaman Pott in Treatment: 12 Wound Status Wound Number: 4 Primary Auto-immune Etiology: Wound Location: Right Lower Leg - Anterior Wound Open Wounding Event: Blister Status: Date Acquired: 03/07/2015 Comorbid Glaucoma, Anemia, Coronary Artery Weeks Of Treatment: 1 History: Disease, Hypertension, Type II Clustered Wound: No Diabetes, End  Stage Renal Disease, Osteoarthritis, Neuropathy Photos Wound Measurements Length: (cm) 6 Width: (cm) 3 Depth: (cm) 0.1 Area: (cm) 14.137 Volume: (cm) 1.414 % Reduction in Area: 51.4% % Reduction in Volume: 51.4% Epithelialization: Large (67-100%) Tunneling: No Undermining: No Wound Description Classification: Partial Thickness Foul Odor A Diabetic Severity (Wagner): Grade 0 Wound Margin: Flat and Intact Exudate Amount: None Present fter Cleansing: No Wound Bed Granulation Amount: Large (67-100%) Exposed Structure Granulation Quality: Red, Pink Fascia Exposed: No Necrotic Amount: None Present (0%) Fat Layer Exposed: No Tendon Exposed: No Muscle Exposed: No Blaney, Shatona S. ([id number]) Joint Exposed: No Bone Exposed: No Limited to Skin Breakdown Periwound Skin Texture Texture Color No Abnormalities Noted: No No Abnormalities Noted: No Callus: No Atrophie Blanche: No Crepitus: No Cyanosis: No Excoriation: No Ecchymosis: No Fluctuance: No Erythema: No Friable: No Hemosiderin Staining: No Induration: No Mottled: No Localized Edema: No Pallor: No Rash: No Rubor: No Scarring: No Temperature / Pain Moisture Temperature: No Abnormality No Abnormalities Noted: No Dry / Scaly: No Maceration: No Moist: Yes Wound Preparation Ulcer Cleansing: Rinsed/Irrigated with Saline Topical Anesthetic Applied: None Treatment Notes Wound #4 (Right, Anterior Lower Leg) 1. Cleansed with: Clean wound with Normal Saline 3. Peri-wound Care: Skin Prep 4. Dressing Applied: Aquacel Ag Mepitel 5. Secondary Dressing Applied Bordered Foam Dressing Notes drawtex Electronic Signature(s) Signed: 03/18/2015 3:58:58 PM By: Regan Lemming [name], RN Previous Signature: 03/17/2015 11:36:04 AM Version By: Regan Lemming [name], RN Entered By: Regan Lemming on 03/18/2015 15:58:58 Pautsch, [name] ([id number]) -------------------------------------------------------------------------------- [place]  Details [name]. Date of Service: 03/17/2015 11:45 AM Medical Record Number: [id number] Patient Account Number: [id number] Date of Birth/Sex: October 31, 1955 (59 y.o. Female) Treating RN: [name], RN, [name], [place] Primary Care Physician: Steele Sizer Other Clinician: Referring Physician: Steele Sizer Treating Physician/Extender: Benjaman Pott in Treatment: 12 Vital Signs Time Taken: 11:29 Temperature (F): 98.4 Height (in): 65 Pulse (bpm): 81 Weight (lbs): 156 Respiratory Rate (breaths/min): 18 Body Mass Index (BMI): 26 Blood Pressure (mmHg): 153/73 Reference Range: 80 - 120 mg / dl Electronic Signature(s) Signed: 03/17/2015 11:31:18 AM By: Regan Lemming [name], RN Entered By: Regan Lemming on 03/17/2015 11:31:18

## 2015-03-17 NOTE — Progress Notes (Signed)
UBAH, RADKE ([id number]) Visit Report for 03/17/2015 Chief Complaint Document Details [name], [name] 03/17/2015 11:45 Date of Service: AM Medical Record [id number] Number: Patient Account Number: [id number] 11-Jan-1956 (59 y.o. Treating RN: Baruch Gouty, RN, [name], [name] Date of Birth/Sex: Female) Other Clinician: Primary Care Physician: Melodye Ped, Genie Mirabal Referring Physician: Steele Sizer Physician/Extender: Suella Grove in Treatment: 12 Information Obtained from: Patient Chief Complaint Patient presents to the wound care center for a consult due non healing wound. She has an open wound on her right upper back which she's had for about a year and she recently noticed a blister on her right lower extremity about 2 weeks ago. Electronic Signature(s) Signed: 03/17/2015 12:12:20 PM By: Judene Companion MD Entered By: Judene Companion on 03/17/2015 12:12:20 Ashlock, [name] ([id number]) -------------------------------------------------------------------------------- HPI Details [name], [name] 03/17/2015 11:45 Date of Service: AM Medical Record [id number] Number: Patient Account Number: [id number] [birth year] (59 y.o. Treating RN: Baruch Gouty, RN, [name], [name] Date of Birth/Sex: Female) Other Clinician: Primary Care Physician: Melodye Ped, Liyah Higham Referring Physician: Steele Sizer Physician/Extender: [name]: 12 History of Present Illness Location: right upper back and right lower extremity wounds Quality: Patient reports No Pain. Severity: Patient states wound (s) are getting better. Duration: Patient has had the wound for > 3 months prior to seeking treatment at the wound center Timing: she thought it first occurred when she was using a heating pad about a year ago after back surgery. Context: The wound appeared gradually over time Modifying Factors: Patient is currently on renal dialysis and receives treatments 3 times  weekly Associated Signs and Symptoms: Patient reports having: surgery scheduled for this week for a AV fistula left arm. HPI Description: 59 year old patient who is known to be a diabetic and has end-stage renal disease has had several comorbidities including coronary artery disease, hypertension, hyperlipidemia, pancreatitis, anemia, previous history of hysterectomy, cholecystectomy, left-sided salivary gland excision, bilateral cataract surgery,Peritoneal dialysis catheter, hemodialysis catheter. the area on the back has also been caused by instant pressure she used to sleep on a recliner all day and has significant kyphoscoliosis. As far as the wound on her right lower extremity she's not sure how this blister occurred but she thought it has been there for about 2 weeks. No recent blood investigations available and no recent hemoglobin A1c. 12/30/2014 -- she is an assisted living facility but I believe the nurses that have not followed instructions as she had some cream applied on her back and there was a different dressing. Last week she's had a AV fistula placed on her left forearm. 01/13/2015 -- she has had some localized infection at the port site and she's been on doxycycline for this. 02/05/2015 - he has developed a small blister on her right lower extremity. 02/10/2015 -- she has developed another small blister on her right anterior chest wall in the area where she's had tape for her dialysis access. this may just be injury caused by a tape burn. 02/18/2015 -- no new blisters and she had a dermatology opinion and they have taken a biopsy of her skin. She also had a left brachial AV fistula placed this week. 03/03/2015 -- though we do not have the pathology report yet the patient says she has been put on prednisone because this skin disease is possibly to do with her immune system and her dermatologist is recommended this. 03/10/2015 -- she has developed a new blister which is quite  large on her right lower extremity  on the shin. CHANITA, BODEN ([id number]) Electronic Signature(s) Signed: 03/17/2015 12:12:51 PM By: Judene Companion MD Entered By: Judene Companion on 03/17/2015 12:12:51 Minner, [name] ([id number]) -------------------------------------------------------------------------------- Physical Exam Details [name], [name] 03/17/2015 11:45 Date of Service: AM Medical Record [id number] Number: Patient Account Number: [id number] June 25, 1956 (59 y.o. Treating RN: Baruch Gouty, RN, [name], [name] Date of Birth/Sex: Female) Other Clinician: Primary Care Physician: Melodye Ped, Kimarion Chery Referring Physician: Steele Sizer Physician/Extender: Suella Grove in Treatment: 12 Electronic Signature(s) Signed: 03/17/2015 12:13:01 PM By: Judene Companion MD Entered By: Judene Companion on 03/17/2015 12:13:01 Esperanza, [name] ([id number]) -------------------------------------------------------------------------------- Physician Orders Details [name], [name] 03/17/2015 11:45 Date of Service: AM Medical Record [id number] Number: Patient Account Number: [id number] [birth year] (59 y.o. Treating RN: Baruch Gouty, RN, [name], [name] Date of Birth/Sex: Female) Other Clinician: Primary Care Physician: Melodye Ped, Jaxxson Cavanah Referring Physician: Steele Sizer Physician/Extender: Suella Grove in Treatment: 12 Verbal / Phone Orders: Yes Clinician: [name], RN, [name], [name] Read Back and Verified: Yes Diagnosis Coding Wound Cleansing Wound #1 Right Back o Clean wound with Normal Saline. Wound #4 Right,Anterior Lower Leg o Clean wound with Normal Saline. Skin Barriers/Peri-Wound Care Wound #1 Right Back o Skin Prep Wound #4 Right,Anterior Lower Leg o Skin Prep Primary Wound Dressing Wound #1 Right Back o Aquacel Ag o Drawtex o Mepitel One Wound #4 Right,Anterior Lower Leg o Aquacel Ag o Mepitel One Secondary Dressing Wound #1 Right  Back o Boardered Foam Dressing Wound #4 Right,Anterior Lower Leg o Boardered Foam Dressing Dressing Change Frequency Wound #1 Right Back Cicero, Carel S. ([id number]) o Change dressing every week - Please keep dressing in place. can change outer with excess drainage prn. Wound #4 Right,Anterior Lower Leg o Change dressing every week - Please keep dressing in place. can change outer with excess drainage prn. Follow-up Appointments Wound #1 Right Back o Return Appointment in 1 week. Wound #4 Right,Anterior Lower Leg o Return Appointment in 1 week. Electronic Signature(s) Signed: 03/17/2015 11:50:05 AM By: Regan Lemming [name], RN Previous Signature: 03/17/2015 11:43:19 AM Version By: Regan Lemming [name], RN Entered By: Regan Lemming on 03/17/2015 11:50:05 Shawgo, [name] ([id number]) -------------------------------------------------------------------------------- Problem List Details [name], [name] 03/17/2015 11:45 Date of Service: AM Medical Record [id number] Number: Patient Account Number: [id number] 1955-08-24 (59 y.o. 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, stage 2 12/23/2014 Yes S80.821A Blister (nonthermal), right lower leg, initial encounter 12/23/2014 Yes N18.6 End stage renal disease 12/23/2014 Yes Inactive Problems Resolved Problems Electronic Signature(s) Signed: 03/17/2015 12:11:59 PM By: Judene Companion MD Entered By: Judene Companion on 03/17/2015 12:11:59 Kleiber, [name] ([id number]) -------------------------------------------------------------------------------- Progress Note Details Patient  Name: [name], [name] 03/17/2015 11:45 Date of Service: AM Medical Record [id number] Number: Patient Account Number: [id number] 1956-01-08 (59 y.o. Treating RN: Baruch Gouty, RN, [name], [name] Date of Birth/Sex: Female) Other Clinician: Primary Care Physician: Melodye Ped, Airrion Otting Referring Physician: Steele Sizer Physician/Extender: Suella Grove in Treatment: 12 Subjective Chief Complaint Information obtained from Patient Patient presents to the wound care center for a consult due non healing wound. She has an open wound on her right upper back which she's had for about a year and she recently noticed a blister on  her right lower extremity about 2 weeks ago. History of Present Illness (HPI) The following HPI elements were documented for the patient's wound: Location: right upper back and right lower extremity wounds Quality: Patient reports No Pain. Severity: Patient states wound (s) are getting better. Duration: Patient has had the wound for > 3 months prior to seeking treatment at the wound center Timing: she thought it first occurred when she was using a heating pad about a year ago after back surgery. Context: The wound appeared gradually over time Modifying Factors: Patient is currently on renal dialysis and receives treatments 3 times weekly Associated Signs and Symptoms: Patient reports having: surgery scheduled for this week for a AV fistula left arm. 59 year old patient who is known to be a diabetic and has end-stage renal disease has had several comorbidities including coronary artery disease, hypertension, hyperlipidemia, pancreatitis, anemia, previous history of hysterectomy, cholecystectomy, left-sided salivary gland excision, bilateral cataract surgery,Peritoneal dialysis catheter, hemodialysis catheter. the area on the back has also been caused by instant pressure she used to sleep on a recliner all day and has significant kyphoscoliosis. As far as the wound on  her right lower extremity she's not sure how this blister occurred but she thought it has been there for about 2 weeks. No recent blood investigations available and no recent hemoglobin A1c. 12/30/2014 -- she is an assisted living facility but I believe the nurses that have not followed instructions as she had some cream applied on her back and there was a different dressing. Last week she's had a AV fistula placed on her left forearm. 01/13/2015 -- she has had some localized infection at the port site and she's been on doxycycline for this. 02/05/2015 - he has developed a small blister on her right lower extremity. 02/10/2015 -- she has developed another small blister on her right anterior chest wall in the area where she's had tape for her dialysis access. this may just be injury caused by a tape burn. [name], [name] ([id number]) 02/18/2015 -- no new blisters and she had a dermatology opinion and they have taken a biopsy of her skin. She also had a left brachial AV fistula placed this week. 03/03/2015 -- though we do not have the pathology report yet the patient says she has been put on prednisone because this skin disease is possibly to do with her immune system and her dermatologist is recommended this. 03/10/2015 -- she has developed a new blister which is quite large on her right lower extremity on the shin. Objective Constitutional Vitals Time Taken: 11:29 AM, Height: 65 in, Weight: 156 lbs, BMI: 26, Temperature: 98.4 F, Pulse: 81 bpm, Respiratory Rate: 18 breaths/min, Blood Pressure: 153/73 mmHg. Integumentary (Hair, Skin) Wound #1 status is Open. Original cause of wound was Blister. The wound is located on the Right Back. The wound measures 3.5cm length x 2.5cm width x 0.1cm depth; 6.872cm^2 area and 0.687cm^3 volume. The wound is limited to skin breakdown. There is no tunneling or undermining noted. There is a medium amount of serosanguineous drainage noted. The wound margin is  distinct with the outline attached to the wound base. There is large (67-100%) red, pink, friable granulation within the wound bed. There is no necrotic tissue within the wound bed. The periwound skin appearance exhibited: Moist. The periwound skin appearance did not exhibit: Callus, Crepitus, Excoriation, Fluctuance, Friable, Induration, Localized Edema, Rash, Scarring, Dry/Scaly, Maceration, Atrophie Blanche, Cyanosis, Ecchymosis, Hemosiderin Staining, Mottled, Pallor, Rubor, Erythema. Periwound temperature was noted as No Abnormality.  Wound #4 status is Open. Original cause of wound was Blister. The wound is located on the Right,Anterior Lower Leg. The wound measures 6cm length x 3cm width x 0.1cm depth; 14.137cm^2 area and 1.414cm^3 volume. The wound is limited to skin breakdown. There is no tunneling or undermining noted. There is a none present amount of drainage noted. The wound margin is flat and intact. There is no granulation within the wound bed. There is no necrotic tissue within the wound bed. The periwound skin appearance did not exhibit: Callus, Crepitus, Excoriation, Fluctuance, Friable, Induration, Localized Edema, Rash, Scarring, Dry/Scaly, Maceration, Moist, Atrophie Blanche, Cyanosis, Ecchymosis, Hemosiderin Staining, Mottled, Pallor, Rubor, Erythema. Periwound temperature was noted as No Abnormality. Assessment Active Problems ICD-10 E11.622 - Type 2 diabetes mellitus with other skin ulcer L89.112 - Pressure ulcer of right upper back, stage 2 [name], [name] ([id number]) O87.867E - Blister (nonthermal), right lower leg, initial encounter N18.6 - End stage renal disease Plan Wound Cleansing: Wound #1 Right Back: Clean wound with Normal Saline. Wound #4 Right,Anterior Lower Leg: Clean wound with Normal Saline. Skin Barriers/Peri-Wound Care: Wound #1 Right Back: Skin Prep Wound #4 Right,Anterior Lower Leg: Skin Prep Primary Wound Dressing: Wound #1 Right  Back: Aquacel Ag Drawtex Mepitel One Wound #4 Right,Anterior Lower Leg: Aquacel Ag Mepitel One Secondary Dressing: Wound #1 Right Back: Boardered Foam Dressing Wound #4 Right,Anterior Lower Leg: Boardered Foam Dressing Dressing Change Frequency: Wound #1 Right Back: Change dressing every week - Please keep dressing in place. can change outer with excess drainage prn. Wound #4 Right,Anterior Lower Leg: Change dressing every week - Please keep dressing in place. can change outer with excess drainage prn. Follow-up Appointments: Wound #1 Right Back: Return Appointment in 1 week. Wound #4 Right,Anterior Lower Leg: Return Appointment in 1 week. Follow-Up Appointments: A Patient Clinical Summary of Care was provided to LD [name], [name] ([id number]) Pressure ulcer on back. aaalso superficial blister like ulcer right anterior leg. Rx with collagen Electronic Signature(s) Signed: 03/17/2015 12:15:11 PM By: Judene Companion MD Entered By: Judene Companion on 03/17/2015 12:15:11 Hett, [name] ([id number]) -------------------------------------------------------------------------------- SuperBill Details [name]. Date of Service: 03/17/2015 Medical Record Patient Account Number: [id number] Number: [name], RN, [name], Treating RN: [birth year] (59 y.o. [name] Date of Birth/Sex: Female) Other Clinician: Primary Care Physician: Melodye Ped, Jadda Hunsucker Referring Physician: Steele Sizer Physician/Extender: Suella Grove in Treatment: 12 Diagnosis Coding ICD-10 Codes Code Description E11.622 Type 2 diabetes mellitus with other skin ulcer L89.112 Pressure ulcer of right upper back, stage 2 S80.821A Blister (nonthermal), right lower leg, initial encounter N18.6 End stage renal disease Facility Procedures CPT4 Code: [id number] Description: 99213 - WOUND CARE VISIT-LEV 3 EST PT Modifier: Quantity: 1 Physician Procedures CPT4 Code: [id number] Description: 94496  - WC PHYS LEVEL 2 - EST PT ICD-10 Description Diagnosis L89.112 Pressure ulcer of right upper back, stage 2 Modifier: Quantity: 1 Electronic Signature(s) Signed: 03/17/2015 12:15:37 PM By: Judene Companion MD Entered By: Judene Companion on 03/17/2015 12:15:37

## 2015-03-17 NOTE — Progress Notes (Signed)
See i heal 

## 2015-03-24 ENCOUNTER — Encounter: Payer: Medicare Other | Attending: Surgery | Admitting: Surgery

## 2015-03-24 DIAGNOSIS — Z992 Dependence on renal dialysis: Secondary | ICD-10-CM | POA: Diagnosis not present

## 2015-03-24 DIAGNOSIS — X58XXXD Exposure to other specified factors, subsequent encounter: Secondary | ICD-10-CM | POA: Insufficient documentation

## 2015-03-24 DIAGNOSIS — L89112 Pressure ulcer of right upper back, stage 2: Secondary | ICD-10-CM | POA: Diagnosis present

## 2015-03-24 DIAGNOSIS — I120 Hypertensive chronic kidney disease with stage 5 chronic kidney disease or end stage renal disease: Secondary | ICD-10-CM | POA: Diagnosis not present

## 2015-03-24 DIAGNOSIS — E11622 Type 2 diabetes mellitus with other skin ulcer: Secondary | ICD-10-CM | POA: Diagnosis not present

## 2015-03-24 DIAGNOSIS — S80821D Blister (nonthermal), right lower leg, subsequent encounter: Secondary | ICD-10-CM | POA: Insufficient documentation

## 2015-03-24 DIAGNOSIS — N186 End stage renal disease: Secondary | ICD-10-CM | POA: Diagnosis not present

## 2015-03-24 DIAGNOSIS — I2510 Atherosclerotic heart disease of native coronary artery without angina pectoris: Secondary | ICD-10-CM | POA: Diagnosis not present

## 2015-03-25 NOTE — Progress Notes (Signed)
SUBRINA, VECCHIARELLI ([id number]) Visit Report for 03/24/2015 Arrival Information Details [name], [name]. Date of Service: 03/24/2015 2:30 PM Medical Record Number: [id number] Patient Account Number: [id number] Date of Birth/Sex: 07/11/1956 (59 y.o. Female) Treating RN: Cornell Barman Primary Care Physician: Steele Sizer Other Clinician: Referring Physician: Steele Sizer Treating Physician/Extender: Frann Rider in Treatment: 64 Visit Information History Since Last Visit Added or deleted any medications: No Patient Arrived: [name] Any new allergies or adverse reactions: No Arrival Time: 14:30 Had a fall or experienced change in No Accompanied By: self activities of daily living that may affect Transfer Assistance: None risk of falls: Patient Identification Verified: Yes Signs or symptoms of abuse/neglect since last No Secondary Verification Process Yes visito Completed: Hospitalized since last visit: No Patient Requires Transmission- No Has Dressing in Place as Prescribed: Yes Based Precautions: Pain Present Now: No Patient Has Alerts: Yes Patient Alerts: Patient on Blood Thinner ABI R: 1.28 Electronic Signature(s) Signed: 03/24/2015 5:22:53 PM By: Gretta Cool, RN, [name], Kim RN, [name] Entered By: Gretta Cool, RN, [name], Kim on 03/24/2015 14:30:57 Nishikawa, [name] ([id number]) -------------------------------------------------------------------------------- Clinic Level of Care Assessment Details [name]. Date of Service: 03/24/2015 2:30 PM Medical Record Number: [id number] Patient Account Number: [id number] Date of Birth/Sex: 08/13/1955 (59 y.o. Female) Treating RN: Cornell Barman Primary Care Physician: Steele Sizer Other Clinician: Referring Physician: Steele Sizer Treating Physician/Extender: Frann Rider in Treatment: 13 Clinic Level of Care Assessment Items TOOL 4 Quantity Score []  - Use when only an EandM is performed on FOLLOW-UP visit  0 ASSESSMENTS - Nursing Assessment / Reassessment []  - Reassessment of Co-morbidities (includes updates in patient status) 0 X - Reassessment of Adherence to Treatment Plan 1 5 ASSESSMENTS - Wound and Skin Assessment / Reassessment X - Simple Wound Assessment / Reassessment - one wound 1 5 []  - Complex Wound Assessment / Reassessment - multiple wounds 0 []  - Dermatologic / Skin Assessment (not related to wound area) 0 ASSESSMENTS - Focused Assessment []  - Circumferential Edema Measurements - multi extremities 0 []  - Nutritional Assessment / Counseling / Intervention 0 []  - Lower Extremity Assessment (monofilament, tuning fork, pulses) 0 []  - Peripheral Arterial Disease Assessment (using hand held doppler) 0 ASSESSMENTS - Ostomy and/or Continence Assessment and Care []  - Incontinence Assessment and Management 0 []  - Ostomy Care Assessment and Management (repouching, etc.) 0 PROCESS - Coordination of Care X - Simple Patient / Family Education for ongoing care 1 15 []  - Complex (extensive) Patient / Family Education for ongoing care 0 X - Staff obtains Programmer, systems, Records, Test Results / Process Orders 1 10 []  - Staff telephones HHA, Nursing Homes / Clarify orders / etc 0 []  - Routine Transfer to another Facility (non-emergent condition) 0 Efaw, Ashlley S. ([id number]) []  - Routine Hospital Admission (non-emergent condition) 0 []  - New Admissions / Biomedical engineer / Ordering NPWT, Apligraf, etc. 0 []  - Emergency Hospital Admission (emergent condition) 0 X - Simple Discharge Coordination 1 10 []  - Complex (extensive) Discharge Coordination 0 PROCESS - Special Needs []  - Pediatric / Minor Patient Management 0 []  - Isolation Patient Management 0 []  - Hearing / Language / Visual special needs 0 []  - Assessment of Community assistance (transportation, D/C planning, etc.) 0 []  - Additional assistance / Altered mentation 0 []  - Support Surface(s) Assessment (bed, cushion, seat, etc.)  0 INTERVENTIONS - Wound Cleansing / Measurement X - Simple Wound Cleansing - one wound 1 5 []  - Complex Wound Cleansing - multiple wounds  0 X - Wound Imaging (photographs - any number of wounds) 1 5 []  - Wound Tracing (instead of photographs) 0 X - Simple Wound Measurement - one wound 1 5 []  - Complex Wound Measurement - multiple wounds 0 INTERVENTIONS - Wound Dressings []  - Small Wound Dressing one or multiple wounds 0 X - Medium Wound Dressing one or multiple wounds 1 15 []  - Large Wound Dressing one or multiple wounds 0 []  - Application of Medications - topical 0 []  - Application of Medications - injection 0 INTERVENTIONS - Miscellaneous []  - External ear exam 0 Thalman, Mahli S. ([id number]) []  - Specimen Collection (cultures, biopsies, blood, body fluids, etc.) 0 []  - Specimen(s) / Culture(s) sent or taken to Lab for analysis 0 []  - Patient Transfer (multiple staff / Harrel Lemon Lift / Similar devices) 0 []  - Simple Staple / Suture removal (25 or less) 0 []  - Complex Staple / Suture removal (26 or more) 0 []  - Hypo / Hyperglycemic Management (close monitor of Blood Glucose) 0 []  - Ankle / Brachial Index (ABI) - do not check if billed separately 0 X - Vital Signs 1 5 Has the patient been seen at the hospital within the last three years: Yes Total Score: 80 Level Of Care: New/Established - Level 3 Electronic Signature(s) Signed: 03/24/2015 5:22:53 PM By: Gretta Cool, RN, [name], Kim RN, [name] Entered By: Gretta Cool, RN, [name], Kim on 03/24/2015 14:47:05 Rayos, [name] ([id number]) -------------------------------------------------------------------------------- Encounter Discharge Information Details [name]. Date of Service: 03/24/2015 2:30 PM Medical Record Number: [id number] Patient Account Number: [id number] Date of Birth/Sex: 1955/09/25 (59 y.o. Female) Treating RN: Cornell Barman Primary Care Physician: Steele Sizer Other Clinician: Referring Physician: Steele Sizer Treating  Physician/Extender: Frann Rider in Treatment: 39 Encounter Discharge Information Items Schedule Follow-up Appointment: No Medication Reconciliation completed No and provided to Patient/Care Cassundra Mckeever: Provided on Clinical Summary of Care: 03/24/2015 Form Type Recipient Paper Patient LD Electronic Signature(s) Signed: 03/24/2015 2:52:10 PM By: Ruthine Dose Entered By: Ruthine Dose on 03/24/2015 14:52:09 Stroh, [name] ([id number]) -------------------------------------------------------------------------------- Lower Extremity Assessment Details Patient Name: Arseneault, [place] S. Date of Service: 03/24/2015 2:30 PM Medical Record Number: [id number] Patient Account Number: [id number] Date of Birth/Sex: 12-01-1955 (59 y.o. Female) Treating RN: Cornell Barman Primary Care Physician: Steele Sizer Other Clinician: Referring Physician: Steele Sizer Treating Physician/Extender: Frann Rider in Treatment: 13 Vascular Assessment Pulses: Posterior Tibial Dorsalis Pedis Palpable: [Right:Yes] Extremity colors, hair growth, and conditions: Extremity Color: [Right:Hyperpigmented] Hair Growth on Extremity: [Right:Yes] Temperature of Extremity: [Right:Warm] Capillary Refill: [Right:< 3 seconds] Toe Nail Assessment Left: Right: Thick: No Discolored: No Deformed: No Improper Length and Hygiene: No Electronic Signature(s) Signed: 03/24/2015 5:22:53 PM By: Gretta Cool, RN, [name], Kim RN, [name] Entered By: Gretta Cool, RN, [name], Kim on 03/24/2015 14:37:38 Sooy, [name] ([id number]) -------------------------------------------------------------------------------- Multi Wound Chart Details [name]. Date of Service: 03/24/2015 2:30 PM Medical Record Number: [id number] Patient Account Number: [id number] Date of Birth/Sex: 07-01-1956 (59 y.o. 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] [N/A:N/A] Comorbid History: [1:Glaucoma, Anemia, Coronary Artery Disease, Hypertension, Type II Diabetes, End Stage Renal Disease, Osteoarthritis, Neuropathy] [4:N/A] [  N/A:N/A] Date Acquired: [1:10/19/2013] [4:03/07/2015] [N/A:N/A] Weeks of Treatment: [1:13] [4:2] [N/A:N/A] Wound Status: [1:Open] [4:Healed - Epithelialized] [N/A:N/A] Measurements L x W x D 3.4x3.2x0.1 [4:0x0x0] [N/A:N/A] (cm) Area (cm) : [1:8.545] [4:0] [N/A:N/A] Volume (cm) : [1:0.855] [4:0] [N/A:N/A] % Reduction in Area: [1:77.80%] [4:N/A] [N/A:N/A] % Reduction in Volume: 77.80% [4:N/A] [N/A:N/A] Classification: [1:Category/Stage II] [4:Partial Thickness] [N/A:N/A] Exudate Amount: [1:Medium] [4:N/A] [N/A:N/A] Exudate Type: [1:Sanguinous] [4:N/A] [N/A:N/A] Exudate Color: [1:red] [4:N/A] [N/A:N/A] Wound Margin: [1:Distinct, outline attached N/A] [N/A:N/A] Granulation Amount: [1:Large (67-100%)] [4:N/A] [N/A:N/A] Granulation Quality: [1:Red, Friable] [4:N/A] [N/A:N/A] Necrotic Amount: [1:None Present (0%)] [4:N/A] [N/A:N/A] Exposed Structures: [1:Fascia: No Fat: No Tendon: No] [4:N/A] [N/A:N/A] Muscle: No Joint: No Bone: No Limited to Skin Breakdown Epithelialization: Medium (34-66%) N/A N/A Periwound Skin Texture: Edema: No No Abnormalities Noted N/A Excoriation: No Induration: No Callus: No Crepitus: No Fluctuance: No Friable: No Rash: No Scarring: No Periwound Skin Moist: Yes No Abnormalities Noted N/A Moisture: Maceration: No Dry/Scaly: No Periwound  Skin Color: Atrophie Blanche: No No Abnormalities Noted N/A Cyanosis: No Ecchymosis: No Erythema: No Hemosiderin Staining: No Mottled: No Pallor: No Rubor: No Temperature: No Abnormality N/A N/A Tenderness on No No N/A Palpation: Wound Preparation: Ulcer Cleansing: N/A N/A Rinsed/Irrigated with Saline Topical Anesthetic Applied: None Treatment Notes Electronic Signature(s) Signed: 03/24/2015 5:22:53 PM By: Gretta Cool, RN, [name], Kim RN, [name] Entered By: Gretta Cool, RN, [name], Kim on 03/24/2015 14:46:01 Joy, [name] ([id number]) -------------------------------------------------------------------------------- Multi-Disciplinary Care Plan Details Patient Name: LYRIK, DOCKSTADER. Date of Service: 03/24/2015 2:30 PM Medical Record Number: [id number] Patient Account Number: [id number] Date of Birth/Sex: 1956-01-28 (59 y.o. Female) Treating RN: Cornell Barman Primary Care Physician: Steele Sizer Other Clinician: Referring Physician: Steele Sizer Treating Physician/Extender: Frann Rider in Treatment: 11 Active Inactive Abuse / Safety / Falls / Self Care Management Nursing Diagnoses: Potential for falls Goals: Patient will remain injury free Date Initiated: 12/23/2014 Goal Status: Active Patient/caregiver will verbalize understanding of skin care regimen Date Initiated: 12/23/2014 Goal Status: Active Patient/caregiver will verbalize/demonstrate measures taken to prevent injury and/or falls Date Initiated: 12/23/2014 Goal Status: Active Patient/caregiver will verbalize/demonstrate understanding of what to do in case of emergency Date Initiated: 12/23/2014 Goal Status: Active Interventions: Assess fall risk on admission and as needed Assess: immobility, friction, shearing, incontinence upon admission and as needed Assess impairment of mobility on admission and as needed per policy Assess self care needs on admission and as needed Provide education on fall prevention Notes: Wound/Skin  Impairment Nursing Diagnoses: Impaired tissue integrity Knowledge deficit related to ulceration/compromised skin integrity Goals: Patient/caregiver will verbalize understanding of skin care regimen PERPETUA, ELLING ([id number]) Date Initiated: 12/23/2014 Goal Status: Active Ulcer/skin breakdown will heal within 14 weeks Date Initiated: 12/23/2014 Goal Status: Active Interventions: Assess patient/caregiver ability to obtain necessary supplies Assess patient/caregiver ability to perform ulcer/skin care regimen upon admission and as needed Assess ulceration(s) every visit Provide education on ulcer and skin care Treatment Activities: Skin care regimen initiated : 03/24/2015 Topical wound management initiated : 03/24/2015 Notes: Electronic Signature(s) Signed: 03/24/2015 5:22:53 PM By: Gretta Cool, RN, [name], Kim RN, [name] Entered By: Gretta Cool, RN, [name], Kim on 03/24/2015 14:45:52 Hufford, [name] ([id number]) -------------------------------------------------------------------------------- Pain Assessment Details [name]. Date of Service: 03/24/2015 2:30 PM Medical Record Number: [id number] Patient Account Number: [id number] Date of Birth/Sex: 14-Sep-1955 (59 y.o. Female) Treating RN: Cornell Barman Primary Care Physician: Steele Sizer Other Clinician: Referring Physician: Steele Sizer Treating Physician/Extender: Frann Rider in Treatment: 13 Active Problems Location of Pain Severity and Description of  Pain Patient Has Paino No Site Locations Pain Management and Medication Current Pain Management: Electronic Signature(s) Signed: 03/24/2015 5:22:53 PM By: Gretta Cool, RN, [name], Kim RN, [name] Entered By: Gretta Cool, RN, [name], Kim on 03/24/2015 14:31:21 Naumann, [name] ([id number]) -------------------------------------------------------------------------------- Patient/Caregiver Education Details [name]. Date of Service: 03/24/2015 2:30 PM Medical Record Number:  [id number] Patient Account Number: [id number] Date of Birth/Gender: 1956-05-16 (59 y.o. Female) Treating RN: Cornell Barman Primary Care Physician: Steele Sizer Other Clinician: Referring Physician: Steele Sizer Treating Physician/Extender: Frann Rider in Treatment: 13 Education Assessment Education Provided To: Patient Education Topics Provided Wound/Skin Impairment: Handouts: Caring for Your Ulcer, Other: continue wound care as prescribed Electronic Signature(s) Signed: 03/24/2015 5:22:53 PM By: Gretta Cool, RN, [name], Kim RN, [name] Entered By: Gretta Cool, RN, [name], Kim on 03/24/2015 14:54:19 Kabler, [name] ([id number]) -------------------------------------------------------------------------------- Wound Assessment Details Patient Name: CORIANA, ANGELLO. Date of Service: 03/24/2015 2:30 PM Medical Record Number: [id number] Patient Account Number: [id number] Date of Birth/Sex: [birth year] (59 y.o. Female) Treating RN: Cornell Barman Primary Care Physician: Steele Sizer Other Clinician: Referring Physician: Steele Sizer Treating Physician/Extender: Frann Rider in Treatment: 13 Wound Status Wound Number: 1 Primary Pressure Ulcer Etiology: Wound Location: Right Back Wound Open Wounding Event: Blister Status: Date Acquired: 10/19/2013 Comorbid Glaucoma, Anemia, Coronary Artery Weeks Of Treatment: 13 History: Disease, Hypertension, Type II Clustered Wound: No Diabetes, End Stage Renal Disease, Osteoarthritis, Neuropathy Photos Photo Uploaded By: Gretta Cool, RN, [name], Kim on 03/24/2015 15:02:59 Wound Measurements Length: (cm) 3.4 Width: (cm) 3.2 Depth: (cm) 0.1 Area: (cm) 8.545 Volume: (cm) 0.855 % Reduction in Area: 77.8% % Reduction in Volume: 77.8% Epithelialization: Medium (34-66%) Wound Description Classification: Category/Stage II Wound Margin: Distinct, outline attached Exudate Amount: Medium Exudate Type: Sanguinous Exudate Color: red Foul Odor After Cleansing:  No Wound Bed Granulation Amount: Large (67-100%) Exposed Structure Granulation Quality: Red, Friable Fascia Exposed: No Necrotic Amount: None Present (0%) Fat Layer Exposed: No Tortora, Olene S. ([id number]) Tendon Exposed: No Muscle Exposed: No Joint Exposed: No Bone Exposed: No Limited to Skin Breakdown Periwound Skin Texture Texture Color No Abnormalities Noted: No No Abnormalities Noted: No Callus: No Atrophie Blanche: No Crepitus: No Cyanosis: No Excoriation: No Ecchymosis: No Fluctuance: No Erythema: No Friable: No Hemosiderin Staining: No Induration: No Mottled: No Localized Edema: No Pallor: No Rash: No Rubor: No Scarring: No Temperature / Pain Moisture Temperature: No Abnormality No Abnormalities Noted: No Dry / Scaly: No Maceration: No Moist: Yes Wound Preparation Ulcer Cleansing: Rinsed/Irrigated with Saline Topical Anesthetic Applied: None Treatment Notes Wound #1 (Right Back) 1. Cleansed with: Clean wound with Normal Saline 4. Dressing Applied: Aquacel Ag Mepitel 5. Secondary Dressing Applied Bordered Foam Dressing Electronic Signature(s) Signed: 03/24/2015 5:22:53 PM By: Gretta Cool, RN, [name], Kim RN, [name] Entered By: Gretta Cool, RN, [name], Kim on 03/24/2015 14:39:17 Uresti, [name] ([id number]) -------------------------------------------------------------------------------- Wound Assessment Details Patient Name: CRISTABEL, BICKNELL. Date of Service: 03/24/2015 2:30 PM Medical Record Number: [id number] Patient Account Number: [id number] Date of Birth/Sex: 1955/10/08 (59 y.o. Female) Treating RN: Cornell Barman Primary Care Physician: Steele Sizer Other Clinician: Referring Physician: Steele Sizer Treating Physician/Extender: Frann Rider in Treatment: 13 Wound Status Wound Number: 4 Primary Etiology: Auto-immune Wound Location: Right, Anterior Lower Leg Wound Status: Healed - Epithelialized Wounding Event: Blister Date Acquired: 03/07/2015 Weeks Of  Treatment: 2 Clustered Wound: No Photos Photo Uploaded By: Gretta Cool, RN, [name], Kim on 03/24/2015 15:03:15 Wound Measurements Length: (cm) Width: (cm) Depth: (cm) Area: (cm) Volume: (cm) 0 % Reduction in Area: 0 %  Reduction in Volume: 0 0 0 Wound Description Classification: Partial Thickness Periwound Skin Texture Texture Color No Abnormalities Noted: No No Abnormalities Noted: No Moisture No Abnormalities Noted: No Electronic Signature(s) Signed: 03/24/2015 5:22:53 PM By: Gretta Cool, RN, [name], Kim RN, [name] Tester, Leda Chauncey Cruel ([id number]) Entered By: Gretta Cool, RN, [name], Kim on 03/24/2015 14:45:41 Krawiec, [name] ([id number]) -------------------------------------------------------------------------------- [place] Details [name]. Date of Service: 03/24/2015 2:30 PM Medical Record Number: [id number] Patient Account Number: [id number] Date of Birth/Sex: February 07, 1956 (59 y.o. Female) Treating RN: Cornell Barman Primary Care Physician: Steele Sizer Other Clinician: Referring Physician: Steele Sizer Treating Physician/Extender: Frann Rider in Treatment: 13 Vital Signs Time Taken: 14:31 Temperature (F): 98.8 Height (in): 65 Pulse (bpm): 92 Weight (lbs): 156 Respiratory Rate (breaths/min): 20 Body Mass Index (BMI): 26 Blood Pressure (mmHg): 183/81 Reference Range: 80 - 120 mg / dl Notes Patient states she has taken medications this morning. If BP remains high patient will follow up with PCP. Patient understands and agrees. Electronic Signature(s) Signed: 03/24/2015 5:22:53 PM By: Gretta Cool, RN, [name], Kim RN, [name] Entered By: Gretta Cool, RN, [name], Kim on 03/24/2015 14:32:55

## 2015-03-25 NOTE — Progress Notes (Signed)
[name], [name] ([id number]) Visit Report for 03/24/2015 Chief Complaint Document Details [name], [name]. Date of Service: 03/24/2015 2:30 PM Medical Record Number: [id number] Patient Account Number: [id number] Date of Birth/Sex: 06-17-1956 (59 y.o. Female) Treating RN: Cornell Barman Primary Care Physician: Steele Sizer Other Clinician: Referring Physician: Steele Sizer Treating Physician/Extender: Frann Rider in Treatment: 13 Information Obtained from: Patient Chief Complaint Patient presents to the wound care center for a consult due non healing wound. She has an open wound on her right upper back which she's had for about a year and she recently noticed a blister on her right lower extremity about 2 weeks ago. Electronic Signature(s) Signed: 03/24/2015 2:48:01 PM By: [name], [name] Entered By: [name] on 03/24/2015 14:48:00 [name] ([id number]) -------------------------------------------------------------------------------- HPI Details [name]. Date of Service: 03/24/2015 2:30 PM Medical Record Number: [id number] Patient Account Number: [id number] Date of Birth/Sex: 1956/05/10 (59 y.o. Female) Treating RN: Cornell Barman Primary Care Physician: Steele Sizer Other Clinician: Referring Physician: Steele Sizer Treating Physician/Extender: Frann Rider in Treatment: 13 History of Present Illness Location: right upper back and right lower extremity wounds Quality: Patient reports No Pain. Severity: Patient states wound (s) are getting better. Duration: Patient has had the wound for > 3 months prior to seeking treatment at the wound center Timing: she thought it first occurred when she was using a heating pad about a year ago after back surgery. Context: The wound appeared gradually over time Modifying Factors: Patient is currently on renal dialysis and receives treatments 3 times weekly Associated Signs and  Symptoms: Patient reports having: surgery scheduled for this week for a AV fistula left arm. HPI Description: 59 year old patient who is known to be a diabetic and has end-stage renal disease has had several comorbidities including coronary artery disease, hypertension, hyperlipidemia, pancreatitis, anemia, previous history of hysterectomy, cholecystectomy, left-sided salivary gland excision, bilateral cataract surgery,Peritoneal dialysis catheter, hemodialysis catheter. the area on the back has also been caused by instant pressure she used to sleep on a recliner all day and has significant kyphoscoliosis. As far as the wound on her right lower extremity she's not sure how this blister occurred but she thought it has been there for about 2 weeks. No recent blood investigations available and no recent hemoglobin A1c. 12/30/2014 -- she is an assisted living facility but I believe the nurses that have not followed instructions as she had some cream applied on her back and there was a different dressing. Last week she's had a AV fistula placed on her left forearm. 01/13/2015 -- she has had some localized infection at the port site and she's been on doxycycline for this. 02/05/2015 - he has developed a small blister on her right lower extremity. 02/10/2015 -- she has developed another small blister on her right anterior chest wall in the area where she's had tape for her dialysis access. this may just be injury caused by a tape burn. 02/18/2015 -- no new blisters and she had a dermatology opinion and they have taken a biopsy of her skin. She also had a left brachial AV fistula placed this week. 03/03/2015 -- though we do not have the pathology report yet the patient says she has been put on prednisone because this skin disease is possibly to do with her immune system and her dermatologist is recommended this. 03/10/2015 -- she has developed a new blister which is quite large on her right lower  extremity on the shin.  Electronic Signature(s) Signed: 03/24/2015 2:48:08 PM By: [name], [name] [name] ([id number]) Entered By: [name] on 03/24/2015 14:48:08 [name] ([id number]) -------------------------------------------------------------------------------- Physical Exam Details [name], 59. Date of Service: 03/24/2015 2:30 PM Medical Record Number: [id number] Patient Account Number: [id number] Date of Birth/Sex: 12-09-1955 (59 y.o. Female) Treating RN: Cornell Barman Primary Care Physician: Steele Sizer Other Clinician: Referring Physician: Steele Sizer Treating Physician/Extender: Frann Rider in Treatment: 13 Constitutional . Pulse regular. Respirations normal and unlabored. Afebrile. . Eyes Nonicteric. Reactive to light. Ears, Nose, Mouth, and Throat Lips, teeth, and gums WNL.Marland Kitchen Moist mucosa without lesions . Neck supple and nontender. No palpable supraclavicular or cervical adenopathy. Normal sized without goiter. Respiratory WNL. No retractions.. Cardiovascular Pedal Pulses WNL. No clubbing, cyanosis or edema. Chest Breasts symmetical and no nipple discharge.. Breast tissue WNL, no masses, lumps, or tenderness.. Lymphatic No adneopathy. No adenopathy. No adenopathy. Musculoskeletal Adexa without tenderness or enlargement.. Digits and nails w/o clubbing, cyanosis, infection, petechiae, ischemia, or inflammatory conditions.. Integumentary (Hair, Skin) No suspicious lesions. No crepitus or fluctuance. No peri-wound warmth or erythema. No masses.Marland Kitchen Psychiatric Judgement and insight Intact.. No evidence of depression, anxiety, or agitation.. Notes The right lower extremity is completely healed out. Her back is looking clean and there is healthy granulation tissue but still a bit moist. Electronic Signature(s) Signed: 03/24/2015 2:48:42 PM By: [name], [name] Entered By: [name] on 03/24/2015 14:48:41 [name],  Christean [name] ([id number]) -------------------------------------------------------------------------------- Physician Orders Details [name]. Date of Service: 03/24/2015 2:30 PM Medical Record Number: [id number] Patient Account Number: [id number] Date of Birth/Sex: 04-Dec-1955 (58 y.o. Female) Treating RN: Cornell Barman Primary Care Physician: Steele Sizer Other Clinician: Referring Physician: Steele Sizer Treating Physician/Extender: Frann Rider in Treatment: 76 Verbal / Phone Orders: Yes Clinician: Cornell Barman Read Back and Verified: Yes Diagnosis Coding Wound Cleansing Wound #1 Right Back o Clean wound with Normal Saline. Skin Barriers/Peri-Wound Care Wound #1 Right Back o Skin Prep Primary Wound Dressing Wound #1 Right Back o Aquacel Ag o Mepitel One Secondary Dressing Wound #1 Right Back o Boardered Foam Dressing Dressing Change Frequency Wound #1 Right Back o Change dressing every week - Please keep dressing in place. can change outer with excess drainage prn. Follow-up Appointments Wound #1 Right Back o Return Appointment in 1 week. Electronic Signature(s) Signed: 03/24/2015 4:40:03 PM By: [name], [name] Signed: 03/24/2015 5:22:53 PM By: Gretta Cool RN, [name], Kim RN, [name] Entered By: Gretta Cool, RN, [name], Kim on 03/24/2015 14:46:32 Manuelito, Christean [name] ([id number]) -------------------------------------------------------------------------------- Problem List Details Patient Name: CIARA, KAGAN. Date of Service: 03/24/2015 2:30 PM Medical Record Number: [id number] Patient Account Number: [id number] Date of Birth/Sex: 12-14-1955 (59 y.o. Female) Treating RN: Cornell Barman Primary Care Physician: Steele Sizer Other Clinician: Referring Physician: Steele Sizer Treating Physician/Extender: Frann Rider in Treatment: 13 Active Problems ICD-10 Encounter Code Description Active Date Diagnosis E11.622 Type 2 diabetes mellitus with  other skin ulcer 12/23/2014 Yes L89.112 Pressure ulcer of right upper back, stage 2 12/23/2014 Yes S80.821A Blister (nonthermal), right lower leg, initial encounter 12/23/2014 Yes N18.6 End stage renal disease 12/23/2014 Yes Inactive Problems Resolved Problems Electronic Signature(s) Signed: 03/24/2015 2:47:43 PM By: [name], [name] Entered By: [name] on 03/24/2015 14:47:43 Theilen, Christean [name] ([id number]) -------------------------------------------------------------------------------- Progress Note Details Patient Name: Lunsford, Verlee S. Date of Service: 03/24/2015 2:30 PM Medical Record Number: [id number] Patient Account Number: [id number] Date of Birth/Sex: [birth year] (59 y.o. Female) Treating RN: Cornell Barman  Primary Care Physician: Steele Sizer Other Clinician: Referring Physician: Steele Sizer Treating Physician/Extender: Frann Rider in Treatment: 13 Subjective Chief Complaint Information obtained from Patient Patient presents to the wound care center for a consult due non healing wound. She has an open wound on her right upper back which she's had for about a year and she recently noticed a blister on her right lower extremity about 2 weeks ago. History of Present Illness (HPI) The following HPI elements were documented for the patient's wound: Location: right upper back and right lower extremity wounds Quality: Patient reports No Pain. Severity: Patient states wound (s) are getting better. Duration: Patient has had the wound for > 3 months prior to seeking treatment at the wound center Timing: she thought it first occurred when she was using a heating pad about a year ago after back surgery. Context: The wound appeared gradually over time Modifying Factors: Patient is currently on renal dialysis and receives treatments 3 times weekly Associated Signs and Symptoms: Patient reports having: surgery scheduled for this week for a AV fistula left arm. 59 year old patient  who is known to be a diabetic and has end-stage renal disease has had several comorbidities including coronary artery disease, hypertension, hyperlipidemia, pancreatitis, anemia, previous history of hysterectomy, cholecystectomy, left-sided salivary gland excision, bilateral cataract surgery,Peritoneal dialysis catheter, hemodialysis catheter. the area on the back has also been caused by instant pressure she used to sleep on a recliner all day and has significant kyphoscoliosis. As far as the wound on her right lower extremity she's not sure how this blister occurred but she thought it has been there for about 2 weeks. No recent blood investigations available and no recent hemoglobin A1c. 12/30/2014 -- she is an assisted living facility but I believe the nurses that have not followed instructions as she had some cream applied on her back and there was a different dressing. Last week she's had a AV fistula placed on her left forearm. 01/13/2015 -- she has had some localized infection at the port site and she's been on doxycycline for this. 02/05/2015 - he has developed a small blister on her right lower extremity. 02/10/2015 -- she has developed another small blister on her right anterior chest wall in the area where she's had tape for her dialysis access. this may just be injury caused by a tape burn. 02/18/2015 -- no new blisters and she had a dermatology opinion and they have taken a biopsy of her skin. Dosanjh, Christean [name] ([id number]) She also had a left brachial AV fistula placed this week. 03/03/2015 -- though we do not have the pathology report yet the patient says she has been put on prednisone because this skin disease is possibly to do with her immune system and her dermatologist is recommended this. 03/10/2015 -- she has developed a new blister which is quite large on her right lower extremity on the shin. Objective Constitutional Pulse regular. Respirations normal and unlabored.  Afebrile. Vitals Time Taken: 2:31 PM, Height: 65 in, Weight: 156 lbs, BMI: 26, Temperature: 98.8 F, Pulse: 92 bpm, Respiratory Rate: 20 breaths/min, Blood Pressure: 183/81 mmHg. General Notes: Patient states she has taken medications this morning. If BP remains high patient will follow up with PCP. Patient understands and agrees. Eyes Nonicteric. Reactive to light. Ears, Nose, Mouth, and Throat Lips, teeth, and gums WNL.Marland Kitchen Moist mucosa without lesions . Neck supple and nontender. No palpable supraclavicular or cervical adenopathy. Normal sized without goiter. Respiratory WNL. No retractions.. Cardiovascular Pedal Pulses WNL.  No clubbing, cyanosis or edema. Chest Breasts symmetical and no nipple discharge.. Breast tissue WNL, no masses, lumps, or tenderness.. Lymphatic No adneopathy. No adenopathy. No adenopathy. Musculoskeletal Adexa without tenderness or enlargement.. Digits and nails w/o clubbing, cyanosis, infection, petechiae, ischemia, or inflammatory conditions.Marland Kitchen Psychiatric Judgement and insight Intact.. No evidence of depression, anxiety, or agitation.[name] ([id number]) General Notes: The right lower extremity is completely healed out. Her back is looking clean and there is healthy granulation tissue but still a bit moist. Integumentary (Hair, Skin) No suspicious lesions. No crepitus or fluctuance. No peri-wound warmth or erythema. No masses.. Wound #1 status is Open. Original cause of wound was Blister. The wound is located on the Right Back. The wound measures 3.4cm length x 3.2cm width x 0.1cm depth; 8.545cm^2 area and 0.855cm^3 volume. The wound is limited to skin breakdown. There is a medium amount of sanguinous drainage noted. The wound margin is distinct with the outline attached to the wound base. There is large (67-100%) red, friable granulation within the wound bed. There is no necrotic tissue within the wound bed. The periwound skin appearance  exhibited: Moist. The periwound skin appearance did not exhibit: Callus, Crepitus, Excoriation, Fluctuance, Friable, Induration, Localized Edema, Rash, Scarring, Dry/Scaly, Maceration, Atrophie Blanche, Cyanosis, Ecchymosis, Hemosiderin Staining, Mottled, Pallor, Rubor, Erythema. Periwound temperature was noted as No Abnormality. Wound #4 status is Healed - Epithelialized. Original cause of wound was Blister. The wound is located on the Right,Anterior Lower Leg. The wound measures 0cm length x 0cm width x 0cm depth; 0cm^2 area and 0cm^3 volume. Assessment Active Problems ICD-10 E11.622 - Type 2 diabetes mellitus with other skin ulcer L89.112 - Pressure ulcer of right upper back, stage 2 S80.821A - Blister (nonthermal), right lower leg, initial encounter N18.6 - End stage renal disease I have recommended a piece of Mepitel followed by a silver alginate over this and then a bordered foam dressing. This wound is fairly driving will not use DrawTex today. She has been doing fine with her AV fistula on her left arm and will continue with dialysis. She will come back and see me next week. Plan Wound Cleansing: Wound #1 Right Back: Clean wound with Normal Saline. Skin Barriers/Peri-Wound Care: SAFARI, CINQUE ([id number]) Wound #1 Right Back: Skin Prep Primary Wound Dressing: Wound #1 Right Back: Aquacel Ag Mepitel One Secondary Dressing: Wound #1 Right Back: Boardered Foam Dressing Dressing Change Frequency: Wound #1 Right Back: Change dressing every week - Please keep dressing in place. can change outer with excess drainage prn. Follow-up Appointments: Wound #1 Right Back: Return Appointment in 1 week. I have recommended a piece of Mepitel followed by a silver alginate over this and then a bordered foam dressing. This wound is fairly dry so we will not use DrawTex today. She has been doing fine with her AV fistula on her left arm and will continue with dialysis. She will come back  and see me next week. Electronic Signature(s) Signed: 03/24/2015 2:51:43 PM By: [name], [name] Entered By: [name] on 03/24/2015 14:51:43 Feldmeier, Christean [name] ([id number]) -------------------------------------------------------------------------------- SuperBill Details [name]. Date of Service: 03/24/2015 Medical Record Number: [id number] Patient Account Number: [id number] Date of Birth/Sex: Aug 08, 1955 (59 y.o. Female) Treating RN: Cornell Barman Primary Care Physician: Steele Sizer Other Clinician: Referring Physician: Steele Sizer Treating Physician/Extender: Frann Rider in Treatment: 13 Diagnosis Coding ICD-10 Codes Code Description E11.622 Type 2 diabetes mellitus with other skin ulcer L89.112 Pressure ulcer of right upper back, stage  2 S80.821A Blister (nonthermal), right lower leg, initial encounter N18.6 End stage renal disease Facility Procedures CPT4 Code: [id number] Description: 99213 - WOUND CARE VISIT-LEV 3 EST PT Modifier: Quantity: 1 Physician Procedures CPT4 Code: 8563149 Description: 70263 - WC PHYS LEVEL 3 - EST PT ICD-10 Description Diagnosis E11.622 Type 2 diabetes mellitus with other skin ulcer L89.112 Pressure ulcer of right upper back, stage 2 S80.821A Blister (nonthermal), right lower leg, initial enc N18.6 End  stage renal disease Modifier: ounter Quantity: 1 Electronic Signature(s) Signed: 03/24/2015 2:51:57 PM By: [name], [name] Entered By: [name] on 03/24/2015 14:51:56

## 2015-03-30 ENCOUNTER — Other Ambulatory Visit: Payer: Self-pay | Admitting: Vascular Surgery

## 2015-03-31 ENCOUNTER — Encounter: Payer: Medicare Other | Admitting: Surgery

## 2015-03-31 DIAGNOSIS — L89112 Pressure ulcer of right upper back, stage 2: Secondary | ICD-10-CM | POA: Diagnosis not present

## 2015-03-31 NOTE — Progress Notes (Addendum)
[name], [name] Visit Report for 03/31/2015 Chief Complaint Document Details [name], [name]. Date of Service: 03/31/2015 1:45 PM Medical Record Patient Account Number: [id number] Number: [name], RN, [name], Treating RN: February 06, 1956 (59 y.o. [name] Date of Birth/Sex: Female) Other Clinician: Primary Care Physician: Steele Sizer Treating [name] Referring Physician: Steele Sizer Physician/Extender: Suella Grove in Treatment: 14 Information Obtained from: Patient Chief Complaint Patient presents to the wound care center for a consult due non healing wound. She has an open wound on her right upper back which she's had for about a year and she recently noticed a blister on her right lower extremity about 2 weeks ago. Electronic Signature(s) Signed: 03/31/2015 2:07:30 PM By: [name], [name] Entered By: [name] on 03/31/2015 14:07:30 [name], [name] ([id number]) -------------------------------------------------------------------------------- HPI Details [name], [name]. Date of Service: 03/31/2015 1:45 PM Medical Record Patient Account Number: [id number] Number: [name], RN, [name], Treating RN: 13-Dec-1955 (59 y.o. [name] Date of Birth/Sex: Female) Other Clinician: Primary Care Physician: Steele Sizer Treating [name] Referring Physician: Steele Sizer Physician/Extender: Suella Grove in Treatment: 14 History of Present Illness Location: right upper back and right lower extremity wounds Quality: Patient reports No Pain. Severity: Patient states wound (s) are getting better. Duration: Patient has had the wound for > 3 months prior to seeking treatment at the wound center Timing: she thought it first occurred when she was using a heating pad about a year ago after back surgery. Context: The wound appeared gradually over time Modifying Factors: Patient is currently on renal dialysis and receives treatments 3 times  weekly Associated Signs and Symptoms: Patient reports having: surgery scheduled for this week for a AV fistula left arm. HPI Description: 59 year old patient who is known to be a diabetic and has end-stage renal disease has had several comorbidities including coronary artery disease, hypertension, hyperlipidemia, pancreatitis, anemia, previous history of hysterectomy, cholecystectomy, left-sided salivary gland excision, bilateral cataract surgery,Peritoneal dialysis catheter, hemodialysis catheter. the area on the back has also been caused by instant pressure she used to sleep on a recliner all day and has significant kyphoscoliosis. As far as the wound on her right lower extremity she's not sure how this blister occurred but she thought it has been there for about 2 weeks. No recent blood investigations available and no recent hemoglobin A1c. 12/30/2014 -- she is an assisted living facility but I believe the nurses that have not followed instructions as she had some cream applied on her back and there was a different dressing. Last week she's had a AV fistula placed on her left forearm. 01/13/2015 -- she has had some localized infection at the port site and she's been on doxycycline for this. 02/05/2015 - he has developed a small blister on her right lower extremity. 02/10/2015 -- she has developed another small blister on her right anterior chest wall in the area where she's had tape for her dialysis access. this may just be injury caused by a tape burn. 02/18/2015 -- no new blisters and she had a dermatology opinion and they have taken a biopsy of her skin. She also had a left brachial AV fistula placed this week. 03/03/2015 -- though we do not have the pathology report yet the patient says she has been put on prednisone because this skin disease is possibly to do with her immune system and her dermatologist is recommended this. 03/10/2015 -- she has developed a new blister which is quite  large on her right lower  extremity on the shin. [name], [name] ([id number]) Electronic Signature(s) Signed: 03/31/2015 2:07:37 PM By: [name], [name] Entered By: [name] on 03/31/2015 14:07:36 [name], [name] ([id number]) -------------------------------------------------------------------------------- Otelia Sergeant TISS Details Patient Name: [name] Date of Service: 03/31/2015 1:45 PM Medical Record Patient Account Number: [id number] Number: [name], RN, [name], Treating RN: 24-Dec-1955 (59 y.o. [name] Date of Birth/Sex: Female) Other Clinician: Primary Care Physician: Steele Sizer Treating [name] Referring Physician: Steele Sizer Physician/Extender: Suella Grove in Treatment: 14 Procedure Performed for: Wound #1 Right Back Performed By: Physician Pat Patrick., MD Post Procedure Diagnosis Same as Pre-procedure Notes there is hypergranulation of the main wound and after using silver nitrate we will apply a bolster to put some pressure and Mepitel to prevent it from sticking. Electronic Signature(s) Signed: 03/31/2015 2:07:24 PM By: [name], [name] Previous Signature: 03/31/2015 2:03:23 PM Version By: Regan Lemming [name], RN Entered By: [name] on 03/31/2015 14:07:24 Hunsberger, [name] ([id number]) -------------------------------------------------------------------------------- Physical Exam Details [name], [name]. Date of Service: 03/31/2015 1:45 PM Medical Record Patient Account Number: [id number] Number: [name], RN, [name], Treating RN: 06-25-1956 (59 y.o. [name] Date of Birth/Sex: Female) Other Clinician: Primary Care Physician: Steele Sizer Treating [name] Referring Physician: Steele Sizer Physician/Extender: Suella Grove in Treatment: 14 Constitutional . Pulse regular. Respirations normal and unlabored. Afebrile. . Eyes Nonicteric. Reactive to light. Ears, Nose, Mouth, and Throat Lips, teeth, and  gums WNL.Marland Kitchen Moist mucosa without lesions . Neck supple and nontender. No palpable supraclavicular or cervical adenopathy. Normal sized without goiter. Respiratory WNL. No retractions.. Cardiovascular Pedal Pulses WNL. No clubbing, cyanosis or edema. Lymphatic No adneopathy. No adenopathy. No adenopathy. Musculoskeletal Adexa without tenderness or enlargement.. Digits and nails w/o clubbing, cyanosis, infection, petechiae, ischemia, or inflammatory conditions.. Integumentary (Hair, Skin) No suspicious lesions. No crepitus or fluctuance. No peri-wound warmth or erythema. No masses.Marland Kitchen Psychiatric Judgement and insight Intact.. No evidence of depression, anxiety, or agitation.. Notes hypergranulation tissue needs to be treated but other than that she is doing well. Electronic Signature(s) Signed: 03/31/2015 2:08:37 PM By: [name], [name] Entered By: [name] on 03/31/2015 14:08:36 [name], [name] ([id number]) -------------------------------------------------------------------------------- Physician Orders Details Patient Name: [name] Date of Service: 03/31/2015 1:45 PM Medical Record Patient Account Number: [id number] Number: [name], RN, [name], Treating RN: 01-30-1956 (59 y.o. [name] Date of Birth/Sex: Female) Other Clinician: Primary Care Physician: Steele Sizer Treating [name] Referring Physician: Steele Sizer Physician/Extender: Suella Grove in Treatment: 20 Verbal / Phone Orders: Yes Clinician: [name], RN, [name], [name] Read Back and Verified: Yes Diagnosis Coding ICD-10 Coding Code Description E11.622 Type 2 diabetes mellitus with other skin ulcer L89.112 Pressure ulcer of right upper back, stage 2 S80.821A Blister (nonthermal), right lower leg, initial encounter N18.6 End stage renal disease Wound Cleansing Wound #1 Right Back o Clean wound with Normal Saline. Skin Barriers/Peri-Wound Care Wound #1 Right Back o Skin Prep Primary Wound  Dressing Wound #1 Right Back o Mepitel One Secondary Dressing Wound #1 Right Back o Dry Gauze o Boardered Foam Dressing Dressing Change Frequency Wound #1 Right Back o Other: - May change outer dressing as needed for drainage. Follow-up Appointments Wound #1 Right Back o Return Appointment in 1 week. [name], [name] ([id number]) Electronic Signature(s) Signed: 03/31/2015 2:10:13 PM By: Regan Lemming [name], RN Signed: 03/31/2015 4:28:23 PM By: [name], [name] Entered By: Regan Lemming on 03/31/2015 14:10:13 [name], [name] ([id number]) -------------------------------------------------------------------------------- Problem List Details [name], [name].  Date of Service: 03/31/2015 1:45 PM Medical Record Patient Account Number: [id number] Number: [name], RN, [name], Treating RN: 1955-11-05 (59 y.o. [name] Date of Birth/Sex: Female) Other Clinician: Primary Care Physician: Steele Sizer Treating [name] Referring Physician: Steele Sizer Physician/Extender: Suella Grove in Treatment: 14 Active Problems ICD-10 Encounter Code Description Active Date Diagnosis E11.622 Type 2 diabetes mellitus with other skin ulcer 12/23/2014 Yes L89.112 Pressure ulcer of right upper back, stage 2 12/23/2014 Yes S80.821A Blister (nonthermal), right lower leg, initial encounter 12/23/2014 Yes N18.6 End stage renal disease 12/23/2014 Yes Inactive Problems Resolved Problems Electronic Signature(s) Signed: 03/31/2015 2:06:17 PM By: [name], [name] Entered By: [name] on 03/31/2015 14:06:17 Ergle, [name] ([id number]) -------------------------------------------------------------------------------- Progress Note Details Patient Name: Hinger, Madalena S. Date of Service: 03/31/2015 1:45 PM Medical Record Patient Account Number: [id number] Number: [name], RN, [name], Treating RN: 1956/01/08 (59 y.o. [name] Date of Birth/Sex: Female) Other Clinician: Primary Care  Physician: Steele Sizer Treating [name] Referring Physician: Steele Sizer Physician/Extender: Suella Grove in Treatment: 14 Subjective Chief Complaint Information obtained from Patient Patient presents to the wound care center for a consult due non healing wound. She has an open wound on her right upper back which she's had for about a year and she recently noticed a blister on her right lower extremity about 2 weeks ago. History of Present Illness (HPI) The following HPI elements were documented for the patient's wound: Location: right upper back and right lower extremity wounds Quality: Patient reports No Pain. Severity: Patient states wound (s) are getting better. Duration: Patient has had the wound for > 3 months prior to seeking treatment at the wound center Timing: she thought it first occurred when she was using a heating pad about a year ago after back surgery. Context: The wound appeared gradually over time Modifying Factors: Patient is currently on renal dialysis and receives treatments 3 times weekly Associated Signs and Symptoms: Patient reports having: surgery scheduled for this week for a AV fistula left arm. 59 year old patient who is known to be a diabetic and has end-stage renal disease has had several comorbidities including coronary artery disease, hypertension, hyperlipidemia, pancreatitis, anemia, previous history of hysterectomy, cholecystectomy, left-sided salivary gland excision, bilateral cataract surgery,Peritoneal dialysis catheter, hemodialysis catheter. the area on the back has also been caused by instant pressure she used to sleep on a recliner all day and has significant kyphoscoliosis. As far as the wound on her right lower extremity she's not sure how this blister occurred but she thought it has been there for about 2 weeks. No recent blood investigations available and no recent hemoglobin A1c. 12/30/2014 -- she is an assisted living facility but  I believe the nurses that have not followed instructions as she had some cream applied on her back and there was a different dressing. Last week she's had a AV fistula placed on her left forearm. 01/13/2015 -- she has had some localized infection at the port site and she's been on doxycycline for this. 02/05/2015 - he has developed a small blister on her right lower extremity. 02/10/2015 -- she has developed another small blister on her right anterior chest wall in the area where she's had tape for her dialysis access. this may just be injury caused by a tape burn. ANDREIKA, VANDAGRIFF ([id number]) 02/18/2015 -- no new blisters and she had a dermatology opinion and they have taken a biopsy of her skin. She also had a left brachial AV fistula placed this week. 03/03/2015 -- though we  do not have the pathology report yet the patient says she has been put on prednisone because this skin disease is possibly to do with her immune system and her dermatologist is recommended this. 03/10/2015 -- she has developed a new blister which is quite large on her right lower extremity on the shin. Objective Constitutional Pulse regular. Respirations normal and unlabored. Afebrile. Vitals Time Taken: 1:56 PM, Height: 65 in, Weight: 156 lbs, BMI: 26, Temperature: 98.3 F, Pulse: 94 bpm, Respiratory Rate: 18 breaths/min, Blood Pressure: 176/85 mmHg. Eyes Nonicteric. Reactive to light. Ears, Nose, Mouth, and Throat Lips, teeth, and gums WNL.Marland Kitchen Moist mucosa without lesions . Neck supple and nontender. No palpable supraclavicular or cervical adenopathy. Normal sized without goiter. Respiratory WNL. No retractions.. Cardiovascular Pedal Pulses WNL. No clubbing, cyanosis or edema. Lymphatic No adneopathy. No adenopathy. No adenopathy. Musculoskeletal Adexa without tenderness or enlargement.. Digits and nails w/o clubbing, cyanosis, infection, petechiae, ischemia, or inflammatory  conditions.Marland Kitchen Psychiatric Judgement and insight Intact.. No evidence of depression, anxiety, or agitation.. General Notes: hypergranulation tissue needs to be treated but other than that she is doing well. [name] ([id number]) Integumentary (Hair, Skin) No suspicious lesions. No crepitus or fluctuance. No peri-wound warmth or erythema. No masses.. Wound #1 status is Open. Original cause of wound was Blister. The wound is located on the Right Back. The wound measures 3.4cm length x 3.3cm width x 0.1cm depth; 8.812cm^2 area and 0.881cm^3 volume. The wound is limited to skin breakdown. There is a medium amount of sanguinous drainage noted. The wound margin is distinct with the outline attached to the wound base. There is large (67-100%) red, friable granulation within the wound bed. There is no necrotic tissue within the wound bed. The periwound skin appearance exhibited: Moist. The periwound skin appearance did not exhibit: Callus, Crepitus, Excoriation, Fluctuance, Friable, Induration, Localized Edema, Rash, Scarring, Dry/Scaly, Maceration, Atrophie Blanche, Cyanosis, Ecchymosis, Hemosiderin Staining, Mottled, Pallor, Rubor, Erythema. Periwound temperature was noted as No Abnormality. Assessment Active Problems ICD-10 E11.622 - Type 2 diabetes mellitus with other skin ulcer L89.112 - Pressure ulcer of right upper back, stage 2 S80.821A - Blister (nonthermal), right lower leg, initial encounter N18.6 - End stage renal disease I have advised mepitel followed by a bolster and a pressure dressing to keep this area to epithelialize. She can leave this on for a week if possible, if not she can get it changed by her nursing staff. She will come back and see as next week. Procedures Wound #1 Wound #1 is a Pressure Ulcer located on the Right Back . An CHEM CAUT GRANULATION TISS procedure was performed by Pat Patrick., MD. Post procedure Diagnosis Wound #1: Same as Pre-Procedure Notes:  there is hypergranulation of the main wound and after using silver nitrate we will apply a bolster to put some pressure and Mepitel to prevent it from sticking. Plan Hase, Muntaha S. ([id number]) I have advised mepitel followed by a bolster and a pressure dressing to keep this area to epithelialize. She can leave this on for a week if possible, if not she can get it changed by her nursing staff. She will come back and see as next week. Electronic Signature(s) Signed: 04/02/2015 4:27:29 PM By: [name], [name] Previous Signature: 03/31/2015 2:10:25 PM Version By: [name], [name] Entered By: [name] on 04/02/2015 16:27:29 Nole, [name] ([id number]) -------------------------------------------------------------------------------- SuperBill Details Patient Name: [name] Date of Service: 03/31/2015 Medical Record Patient Account Number: [id number] Number: [name], RN, [name],  Treating RN: 11/24/1955 (59 y.o. [name] Date of Birth/Sex: Female) Other Clinician: Primary Care Physician: Steele Sizer Treating [name] Referring Physician: Steele Sizer Physician/Extender: Suella Grove in Treatment: 14 Diagnosis Coding ICD-10 Codes Code Description E11.622 Type 2 diabetes mellitus with other skin ulcer L89.112 Pressure ulcer of right upper back, stage 2 S80.821A Blister (nonthermal), right lower leg, initial encounter N18.6 End stage renal disease Facility Procedures CPT4 Code: [id number] Description: 91505 - CHEM CAUT GRANULATION TISS ICD-10 Description Diagnosis E11.622 Type 2 diabetes mellitus with other skin ulcer L89.112 Pressure ulcer of right upper back, stage 2 S80.821A Blister (nonthermal), right lower leg, initial enco N18.6 End  stage renal disease Modifier: unter Quantity: 1 Physician Procedures CPT4 Code: [id number] Description: [id number] - WC PHYS CHEM CAUT GRAN TISSUE ICD-10 Description Diagnosis E11.622 Type 2 diabetes mellitus with other skin ulcer  L89.112 Pressure ulcer of right upper back, stage 2 S80.821A Blister (nonthermal), right lower leg, initial enco N18.6  End stage renal disease Modifier: unter Quantity: 1 Electronic Signature(s) Signed: 03/31/2015 2:10:55 PM By: [name], [name] Entered By: [name] on 03/31/2015 14:10:55

## 2015-03-31 NOTE — Progress Notes (Addendum)
JARAH, PEMBER ([id number]) Visit Report for 03/31/2015 Arrival Information Details [name], [name]. Date of Service: 03/31/2015 1:45 PM Medical Record Number: [id number] Patient Account Number: [id number] Date of Birth/Sex: [birth year] (59 y.o. Female) Treating RN: Baruch Gouty, RN, [name], [name] Primary Care Physician: Steele Sizer Other Clinician: Referring Physician: Steele Sizer Treating Physician/Extender: Frann Rider in Treatment: 14 Visit Information History Since Last Visit Any new allergies or adverse reactions: No Patient Arrived: Ambulatory Had a fall or experienced change in No Arrival Time: 13:53 activities of daily living that may affect Accompanied By: self risk of falls: Transfer Assistance: None Signs or symptoms of abuse/neglect since last No Patient Identification Verified: Yes visito Secondary Verification Process Yes Hospitalized since last visit: No Completed: Has Dressing in Place as Prescribed: Yes Patient Requires Transmission- No Pain Present Now: No Based Precautions: Patient Has Alerts: Yes Patient Alerts: Patient on Blood Thinner ABI R: 1.28 Electronic Signature(s) Signed: 03/31/2015 1:56:48 PM By: Regan Lemming [name], RN Entered By: Regan Lemming on 03/31/2015 13:56:48 Captain, [name] ([id number]) -------------------------------------------------------------------------------- Encounter Discharge Information Details [name]. Date of Service: 03/31/2015 1:45 PM Medical Record Number: [id number] Patient Account Number: [id number] Date of Birth/Sex: 15-Nov-1955 (59 y.o. Female) Treating RN: Baruch Gouty, RN, [name], [name] Primary Care Physician: Steele Sizer Other Clinician: Referring Physician: Steele Sizer Treating Physician/Extender: Frann Rider in Treatment: 14 Encounter Discharge Information Items Discharge Pain Level: 0 Discharge Condition: Stable Ambulatory Status: Walker Discharge Destination:  Home Transportation: Private Auto Accompanied By: self Schedule Follow-up Appointment: No Medication Reconciliation completed No and provided to Patient/Care Brandi Tomlinson: Provided on Clinical Summary of Care: 03/31/2015 Form Type Recipient Paper Patient LD Electronic Signature(s) Signed: 03/31/2015 2:14:51 PM By: Regan Lemming [name], RN Previous Signature: 03/31/2015 2:10:07 PM Version By: Ruthine Dose Entered By: Regan Lemming on 03/31/2015 14:14:51 Cowdrey, [name] ([id number]) -------------------------------------------------------------------------------- Lower Extremity Assessment Details Patient Name: [name], [name] Date of Service: 03/31/2015 1:45 PM Medical Record Number: [id number] Patient Account Number: [id number] Date of Birth/Sex: 28-May-1956 (59 y.o. Female) Treating RN: [name], RN, [name], [name] Primary Care Physician: Steele Sizer Other Clinician: Referring Physician: Steele Sizer Treating Physician/Extender: Frann Rider in Treatment: 14 Electronic Signature(s) Signed: 03/31/2015 1:57:25 PM By: Regan Lemming [name], RN Entered By: Regan Lemming on 03/31/2015 13:57:24 Copado, [name] ([id number]) -------------------------------------------------------------------------------- Multi Wound Chart Details [name]. Date of Service: 03/31/2015 1:45 PM Medical Record Number: [id number] Patient Account Number: [id number] Date of Birth/Sex: Jan 02, 1956 (59 y.o. Female) Treating RN: Baruch Gouty, RN, [name], [name] Primary Care Physician: Steele Sizer Other Clinician: Referring Physician: Steele Sizer Treating Physician/Extender: Frann Rider in Treatment: 14 Vital Signs Height(in): 65 Pulse(bpm): 94 Weight(lbs): 156 Blood Pressure 176/85 (mmHg): Body Mass Index(BMI): 26 Temperature(F): 98.3 Respiratory Rate 18 (breaths/min): Photos: [1:No Photos] [N/A:N/A] Wound Location: [1:Right Back] [N/A:N/A] Wounding Event: [1:Blister] [N/A:N/A] Primary  Etiology: [1:Pressure Ulcer] [N/A:N/A] Date Acquired: [1:10/19/2013] [N/A:N/A] Weeks of Treatment: [1:14] [N/A:N/A] Wound Status: [1:Open] [N/A:N/A] Measurements L x W x D 3.4x3.3x0.1 [N/A:N/A] (cm) Area (cm) : [1:8.812] [N/A:N/A] Volume (cm) : [1:0.881] [N/A:N/A] % Reduction in Area: [1:77.10%] [N/A:N/A] % Reduction in Volume: 77.10% [N/A:N/A] Classification: [1:Category/Stage II] [N/A:N/A] Periwound Skin Texture: No Abnormalities Noted [N/A:N/A] Periwound Skin [1:No Abnormalities Noted] [N/A:N/A] Moisture: Periwound Skin Color: No Abnormalities Noted [N/A:N/A] Tenderness on [1:No] [N/A:N/A] Palpation: Procedures Performed: CHEM CAUT [1:GRANULATION TISS] [N/A:N/A] Treatment Notes Electronic Signature(s) Signed: 03/31/2015 2:09:01 PM By: Regan Lemming [name], RN Entered By: Regan Lemming on 03/31/2015 14:09:01 Lambson, [name] ([id number]) Saine, Mackenze S. (  [id number]) -------------------------------------------------------------------------------- Multi-Disciplinary Care Plan Details [name]. Date of Service: 03/31/2015 1:45 PM Medical Record Number: [id number] Patient Account Number: [id number] Date of Birth/Sex: 08/03/1955 (59 y.o. Female) Treating RN: Baruch Gouty, RN, [name], [name] Primary Care Physician: Steele Sizer Other Clinician: Referring Physician: Steele Sizer Treating Physician/Extender: Frann Rider in Treatment: 14 Active Inactive Abuse / Safety / Falls / Self Care Management Nursing Diagnoses: Potential for falls Goals: Patient will remain injury free Date Initiated: 12/23/2014 Goal Status: Active Patient/caregiver will verbalize understanding of skin care regimen Date Initiated: 12/23/2014 Goal Status: Active Patient/caregiver will verbalize/demonstrate measures taken to prevent injury and/or falls Date Initiated: 12/23/2014 Goal Status: Active Patient/caregiver will verbalize/demonstrate understanding of what to do in case of emergency Date  Initiated: 12/23/2014 Goal Status: Active Interventions: Assess fall risk on admission and as needed Assess: immobility, friction, shearing, incontinence upon admission and as needed Assess impairment of mobility on admission and as needed per policy Assess self care needs on admission and as needed Provide education on fall prevention Notes: Wound/Skin Impairment Nursing Diagnoses: Impaired tissue integrity Knowledge deficit related to ulceration/compromised skin integrity Goals: Patient/caregiver will verbalize understanding of skin care regimen JAHNAVI, MURATORE ([id number]) Date Initiated: 12/23/2014 Goal Status: Active Ulcer/skin breakdown will heal within 14 weeks Date Initiated: 12/23/2014 Goal Status: Active Interventions: Assess patient/caregiver ability to obtain necessary supplies Assess patient/caregiver ability to perform ulcer/skin care regimen upon admission and as needed Assess ulceration(s) every visit Provide education on ulcer and skin care Treatment Activities: Skin care regimen initiated : 03/31/2015 Topical wound management initiated : 03/31/2015 Notes: Electronic Signature(s) Signed: 03/31/2015 2:03:33 PM By: Regan Lemming [name], RN Entered By: Regan Lemming on 03/31/2015 14:03:32 Donavan, [name] ([id number]) -------------------------------------------------------------------------------- Pain Assessment Details [name]. Date of Service: 03/31/2015 1:45 PM Medical Record Number: [id number] Patient Account Number: [id number] Date of Birth/Sex: 09/16/1955 (59 y.o. Female) Treating RN: Baruch Gouty, RN, [name], [name] Primary Care Physician: Steele Sizer Other Clinician: Referring Physician: Steele Sizer Treating Physician/Extender: Frann Rider in Treatment: 14 Active Problems Location of Pain Severity and Description of Pain Patient Has Paino No Site Locations Pain Management and Medication Current Pain Management: Electronic  Signature(s) Signed: 03/31/2015 1:56:55 PM By: Regan Lemming [name], RN Entered By: Regan Lemming on 03/31/2015 13:56:55 Schrecengost, [name] ([id number]) -------------------------------------------------------------------------------- Patient/Caregiver Education Details [name]. Date of Service: 03/31/2015 1:45 PM Medical Record Number: [id number] Patient Account Number: [id number] Date of Birth/Gender: 1955/10/22 (59 y.o. Female) Treating RN: Baruch Gouty, RN, [name], [name] Primary Care Physician: Steele Sizer Other Clinician: Referring Physician: Steele Sizer Treating Physician/Extender: Frann Rider in Treatment: 14 Education Assessment Education Provided To: Patient Education Topics Provided Safety: Responses: State content correctly Wound/Skin Impairment: Methods: Explain/Verbal Responses: State content correctly Electronic Signature(s) Signed: 03/31/2015 2:15:06 PM By: Regan Lemming [name], RN Entered By: Regan Lemming on 03/31/2015 14:15:06 Fraiser, [name] ([id number]) -------------------------------------------------------------------------------- Wound Assessment Details [name]. Date of Service: 03/31/2015 1:45 PM Medical Record Number: [id number] Patient Account Number: [id number] Date of Birth/Sex: 1955/12/06 (59 y.o. Female) Treating RN: Baruch Gouty, RN, [name], [name] Primary Care Physician: Steele Sizer Other Clinician: Referring Physician: Steele Sizer Treating Physician/Extender: Frann Rider in Treatment: 14 Wound Status Wound Number: 1 Primary Pressure Ulcer Etiology: Wound Location: Right Back Wound Open Wounding Event: Blister Status: Date Acquired: 10/19/2013 Comorbid Glaucoma, Anemia, Coronary Artery Weeks Of Treatment: 14 History: Disease, Hypertension, Type II Clustered Wound: No Diabetes, End Stage Renal Disease, Osteoarthritis, Neuropathy Photos Wound Measurements Length: (cm)  3.4 Width: (cm) 3.3 Depth: (cm)  0.1 Area: (cm) 8.812 Volume: (cm) 0.881 % Reduction in Area: 77.1% % Reduction in Volume: 77.1% Epithelialization: Medium (34-66%) Wound Description Classification: Category/Stage II Wound Margin: Distinct, outline attached Exudate Amount: Medium Exudate Type: Sanguinous Exudate Color: red Foul Odor After Cleansing: No Wound Bed Granulation Amount: Large (67-100%) Exposed Structure Granulation Quality: Red, Friable Fascia Exposed: No Necrotic Amount: None Present (0%) Fat Layer Exposed: No Tendon Exposed: No Salts, Terianna S. ([id number]) Muscle Exposed: No Joint Exposed: No Bone Exposed: No Limited to Skin Breakdown Periwound Skin Texture Texture Color No Abnormalities Noted: No No Abnormalities Noted: No Callus: No Atrophie Blanche: No Crepitus: No Cyanosis: No Excoriation: No Ecchymosis: No Fluctuance: No Erythema: No Friable: No Hemosiderin Staining: No Induration: No Mottled: No Localized Edema: No Pallor: No Rash: No Rubor: No Scarring: No Temperature / Pain Moisture Temperature: No Abnormality No Abnormalities Noted: No Dry / Scaly: No Maceration: No Moist: Yes Wound Preparation Ulcer Cleansing: Rinsed/Irrigated with Saline Topical Anesthetic Applied: None Treatment Notes Wound #1 (Right Back) 1. Cleansed with: Clean wound with Normal Saline 4. Dressing Applied: Mepitel 5. Secondary Dressing Applied Bordered Foam Dressing Dry Gauze Electronic Signature(s) Signed: 04/01/2015 2:37:01 PM By: Regan Lemming [name], RN Previous Signature: 03/31/2015 4:45:16 PM Version By: Regan Lemming [name], RN Entered By: Regan Lemming on 04/01/2015 14:37:01 Heavner, [name] ([id number]) -------------------------------------------------------------------------------- Vitals Details [name]. Date of Service: 03/31/2015 1:45 PM Medical Record Number: [id number] Patient Account Number: [id number] Date of Birth/Sex: 1956/03/19 (59 y.o. Female) Treating RN:  [name], RN, [name], [place] Primary Care Physician: Steele Sizer Other Clinician: Referring Physician: Steele Sizer Treating Physician/Extender: Frann Rider in Treatment: 14 Vital Signs Time Taken: 13:56 Temperature (F): 98.3 Height (in): 65 Pulse (bpm): 94 Weight (lbs): 156 Respiratory Rate (breaths/min): 18 Body Mass Index (BMI): 26 Blood Pressure (mmHg): 176/85 Reference Range: 80 - 120 mg / dl Electronic Signature(s) Signed: 03/31/2015 1:57:16 PM By: Regan Lemming [name], RN Entered By: Regan Lemming on 03/31/2015 13:57:16

## 2015-04-02 MED ORDER — INSULIN ASPART 100 UNIT/ML ~~LOC~~ SOLN: 0.0000 [IU] | SUBCUTANEOUS | Status: DC

## 2015-04-07 ENCOUNTER — Encounter: Payer: Medicare Other | Admitting: Surgery

## 2015-04-07 DIAGNOSIS — L89112 Pressure ulcer of right upper back, stage 2: Secondary | ICD-10-CM | POA: Diagnosis not present

## 2015-04-07 NOTE — Progress Notes (Signed)
LYNDZEE, KLIEBERT ([id number]) Visit Report for 04/07/2015 Chief Complaint Document Details [name], [name]. Date of Service: 04/07/2015 1:00 PM Medical Record Number: [id number] Patient Account Number: [id number] Date of Birth/Sex: 1955/07/25 (59 y.o. Female) Treating RN: Cornell Barman Primary Care Physician: Steele Sizer Other Clinician: Referring Physician: Steele Sizer Treating Physician/Extender: Frann Rider in Treatment: 15 Information Obtained from: Patient Chief Complaint Patient presents to the wound care center for a consult due non healing wound. She has an open wound on her right upper back which she's had for about a year and she recently noticed a blister on her right lower extremity about 2 weeks ago. Electronic Signature(s) Signed: 04/07/2015 1:23:03 PM By: [name], [name] Entered By: [name] on 04/07/2015 13:23:03 Thai, [name] ([id number]) -------------------------------------------------------------------------------- HPI Details Patient Name: [name], [name]. Date of Service: 04/07/2015 1:00 PM Medical Record Number: [id number] Patient Account Number: [id number] Date of Birth/Sex: 1955-09-13 (59 y.o. Female) Treating RN: Cornell Barman Primary Care Physician: Steele Sizer Other Clinician: Referring Physician: Steele Sizer Treating Physician/Extender: Frann Rider in Treatment: 15 History of Present Illness Location: right upper back and right lower extremity wounds Quality: Patient reports No Pain. Severity: Patient states wound (s) are getting better. Duration: Patient has had the wound for > 3 months prior to seeking treatment at the wound center Timing: she thought it first occurred when she was using a heating pad about a year ago after back surgery. Context: The wound appeared gradually over time Modifying Factors: Patient is currently on renal dialysis and receives treatments 3 times weekly Associated Signs and  Symptoms: Patient reports having: surgery scheduled for this week for a AV fistula left arm. HPI Description: 59 year old patient who is known to be a diabetic and has end-stage renal disease has had several comorbidities including coronary artery disease, hypertension, hyperlipidemia, pancreatitis, anemia, previous history of hysterectomy, cholecystectomy, left-sided salivary gland excision, bilateral cataract surgery,Peritoneal dialysis catheter, hemodialysis catheter. the area on the back has also been caused by instant pressure she used to sleep on a recliner all day and has significant kyphoscoliosis. As far as the wound on her right lower extremity she's not sure how this blister occurred but she thought it has been there for about 2 weeks. No recent blood investigations available and no recent hemoglobin A1c. 12/30/2014 -- she is an assisted living facility but I believe the nurses that have not followed instructions as she had some cream applied on her back and there was a different dressing. Last week she's had a AV fistula placed on her left forearm. 01/13/2015 -- she has had some localized infection at the port site and she's been on doxycycline for this. 02/05/2015 - he has developed a small blister on her right lower extremity. 02/10/2015 -- she has developed another small blister on her right anterior chest wall in the area where she's had tape for her dialysis access. this may just be injury caused by a tape burn. 02/18/2015 -- no new blisters and she had a dermatology opinion and they have taken a biopsy of her skin. She also had a left brachial AV fistula placed this week. 03/03/2015 -- though we do not have the pathology report yet the patient says she has been put on prednisone because this skin disease is possibly to do with her immune system and her dermatologist is recommended this. 03/10/2015 -- she has developed a new blister which is quite large on her right lower  extremity on the shin.  Electronic Signature(s) Signed: 04/07/2015 1:23:11 PM By: [name], [name] Tallman, [name] ([id number]) Entered By: [name] on 04/07/2015 13:23:11 Gauger, [name] ([id number]) -------------------------------------------------------------------------------- Physical Exam Details Patient Name: [name], [name]. Date of Service: 04/07/2015 1:00 PM Medical Record Number: [id number] Patient Account Number: [id number] Date of Birth/Sex: [birth year] (59 y.o. Female) Treating RN: Cornell Barman Primary Care Physician: Steele Sizer Other Clinician: Referring Physician: Steele Sizer Treating Physician/Extender: Frann Rider in Treatment: 15 Constitutional . Pulse regular. Respirations normal and unlabored. Afebrile. . Eyes Nonicteric. Reactive to light. Ears, Nose, Mouth, and Throat Lips, teeth, and gums WNL.Marland Kitchen Moist mucosa without lesions . Neck supple and nontender. No palpable supraclavicular or cervical adenopathy. Normal sized without goiter. Respiratory WNL. No retractions.. Cardiovascular Pedal Pulses WNL. No clubbing, cyanosis or edema. Chest Breasts symmetical and no nipple discharge.. Breast tissue WNL, no masses, lumps, or tenderness.. Lymphatic No adneopathy. No adenopathy. No adenopathy. Musculoskeletal Adexa without tenderness or enlargement.. Digits and nails w/o clubbing, cyanosis, infection, petechiae, ischemia, or inflammatory conditions.. Integumentary (Hair, Skin) No suspicious lesions. No crepitus or fluctuance. No peri-wound warmth or erythema. No masses.Marland Kitchen Psychiatric Judgement and insight Intact.. No evidence of depression, anxiety, or agitation.. Notes the back wound has gone down in size and has some granulation tissue which is healthy. the right lower extremity has a small eschar but no open blister. Electronic Signature(s) Signed: 04/07/2015 1:23:54 PM By: [name], [name] Entered By: [name] on 04/07/2015  13:23:54 Dudzinski, [name] ([id number]) -------------------------------------------------------------------------------- Physician Orders Details Patient Name: [name] Date of Service: 04/07/2015 1:00 PM Medical Record Number: [id number] Patient Account Number: [id number] Date of Birth/Sex: July 17, 1956 (59 y.o. Female) Treating RN: Cornell Barman Primary Care Physician: Steele Sizer Other Clinician: Referring Physician: Steele Sizer Treating Physician/Extender: Frann Rider in Treatment: 15 Verbal / Phone Orders: Yes Clinician: Cornell Barman Read Back and Verified: Yes Diagnosis Coding Wound Cleansing Wound #1 Right Back o Clean wound with Normal Saline. Wound #5 Right,Medial,Anterior Lower Leg o Clean wound with Normal Saline. Primary Wound Dressing Wound #1 Right Back o Mepitel One Wound #5 Right,Medial,Anterior Lower Leg o Mepitel One Secondary Dressing Wound #1 Right Back o Dry Gauze o Boardered Foam Dressing Wound #5 Right,Medial,Anterior Lower Leg o Boardered Foam Dressing Dressing Change Frequency Wound #1 Right Back o Other: - May change outer dressing as needed for drainage. Wound #5 Right,Medial,Anterior Lower Leg o Other: - May change outer dressing as needed for drainage. Follow-up Appointments Wound #1 Right Back o Return Appointment in 1 week. Electronic Signature(s) Signed: 04/07/2015 2:12:38 PM By: Gretta Cool RN, [name], Kim RN, [name] Montalvo, [name] ([id number]) Signed: 04/07/2015 3:11:54 PM By: [name], [name] Entered By: Gretta Cool RN, [name], Kim on 04/07/2015 13:20:42 Funke, [name] ([id number]) -------------------------------------------------------------------------------- Problem List Details Patient Name: ALYVIA, DERK. Date of Service: 04/07/2015 1:00 PM Medical Record Number: [id number] Patient Account Number: [id number] Date of Birth/Sex: [birth year] (59 y.o. Female) Treating RN: Cornell Barman Primary Care Physician: Steele Sizer Other Clinician: Referring Physician: Steele Sizer Treating Physician/Extender: Frann Rider in Treatment: 15 Active Problems ICD-10 Encounter Code Description Active Date Diagnosis E11.622 Type 2 diabetes mellitus with other skin ulcer 12/23/2014 Yes L89.112 Pressure ulcer of right upper back, stage 2 12/23/2014 Yes S80.821A Blister (nonthermal), right lower leg, initial encounter 12/23/2014 Yes N18.6 End stage renal disease 12/23/2014 Yes Inactive Problems Resolved Problems Electronic Signature(s) Signed: 04/07/2015 1:22:57 PM By: [name], [name] Entered By: [name] on 04/07/2015 13:22:57 Wires, Keylah S. (  [id number]) -------------------------------------------------------------------------------- Progress Note Details Patient Name: KORI, GOINS. Date of Service: 04/07/2015 1:00 PM Medical Record Number: [id number] Patient Account Number: [id number] Date of Birth/Sex: 1956/05/14 (59 y.o. Female) Treating RN: Cornell Barman Primary Care Physician: Steele Sizer Other Clinician: Referring Physician: Steele Sizer Treating Physician/Extender: Frann Rider in Treatment: 15 Subjective Chief Complaint Information obtained from Patient Patient presents to the wound care center for a consult due non healing wound. She has an open wound on her right upper back which she's had for about a year and she recently noticed a blister on her right lower extremity about 2 weeks ago. History of Present Illness (HPI) The following HPI elements were documented for the patient's wound: Location: right upper back and right lower extremity wounds Quality: Patient reports No Pain. Severity: Patient states wound (s) are getting better. Duration: Patient has had the wound for > 3 months prior to seeking treatment at the wound center Timing: she thought it first occurred when she was using a heating pad about a year ago after back surgery. Context: The wound appeared  gradually over time Modifying Factors: Patient is currently on renal dialysis and receives treatments 3 times weekly Associated Signs and Symptoms: Patient reports having: surgery scheduled for this week for a AV fistula left arm. 59 year old patient who is known to be a diabetic and has end-stage renal disease has had several comorbidities including coronary artery disease, hypertension, hyperlipidemia, pancreatitis, anemia, previous history of hysterectomy, cholecystectomy, left-sided salivary gland excision, bilateral cataract surgery,Peritoneal dialysis catheter, hemodialysis catheter. the area on the back has also been caused by instant pressure she used to sleep on a recliner all day and has significant kyphoscoliosis. As far as the wound on her right lower extremity she's not sure how this blister occurred but she thought it has been there for about 2 weeks. No recent blood investigations available and no recent hemoglobin A1c. 12/30/2014 -- she is an assisted living facility but I believe the nurses that have not followed instructions as she had some cream applied on her back and there was a different dressing. Last week she's had a AV fistula placed on her left forearm. 01/13/2015 -- she has had some localized infection at the port site and she's been on doxycycline for this. 02/05/2015 - he has developed a small blister on her right lower extremity. 02/10/2015 -- she has developed another small blister on her right anterior chest wall in the area where she's had tape for her dialysis access. this may just be injury caused by a tape burn. 02/18/2015 -- no new blisters and she had a dermatology opinion and they have taken a biopsy of her skin. Billingham, [name] ([id number]) She also had a left brachial AV fistula placed this week. 03/03/2015 -- though we do not have the pathology report yet the patient says she has been put on prednisone because this skin disease is possibly to do with  her immune system and her dermatologist is recommended this. 03/10/2015 -- she has developed a new blister which is quite large on her right lower extremity on the shin. Objective Constitutional Pulse regular. Respirations normal and unlabored. Afebrile. Vitals Time Taken: 1:06 PM, Height: 65 in, Weight: 156 lbs, BMI: 26, Temperature: 98.1 F, Pulse: 92 bpm, Respiratory Rate: 18 breaths/min, Blood Pressure: 170/74 mmHg. Eyes Nonicteric. Reactive to light. Ears, Nose, Mouth, and Throat Lips, teeth, and gums WNL.Marland Kitchen Moist mucosa without lesions . Neck supple and nontender. No palpable supraclavicular or cervical adenopathy. Normal  sized without goiter. Respiratory WNL. No retractions.. Cardiovascular Pedal Pulses WNL. No clubbing, cyanosis or edema. Chest Breasts symmetical and no nipple discharge.. Breast tissue WNL, no masses, lumps, or tenderness.. Lymphatic No adneopathy. No adenopathy. No adenopathy. Musculoskeletal Adexa without tenderness or enlargement.. Digits and nails w/o clubbing, cyanosis, infection, petechiae, ischemia, or inflammatory conditions.Marland Kitchen Psychiatric Judgement and insight Intact.. No evidence of depression, anxiety, or agitation.. General Notes: the back wound has gone down in size and has some granulation tissue which is healthy. Sickinger, [name] ([id number]) the right lower extremity has a small eschar but no open blister. Integumentary (Hair, Skin) No suspicious lesions. No crepitus or fluctuance. No peri-wound warmth or erythema. No masses.. Wound #1 status is Open. Original cause of wound was Blister. The wound is located on the Right Back. The wound measures 2.7cm length x 3cm width x 0.1cm depth; 6.362cm^2 area and 0.636cm^3 volume. The wound is limited to skin breakdown. There is a medium amount of sanguinous drainage noted. The wound margin is distinct with the outline attached to the wound base. There is large (67-100%) red, friable granulation within  the wound bed. There is no necrotic tissue within the wound bed. The periwound skin appearance exhibited: Moist. The periwound skin appearance did not exhibit: Callus, Crepitus, Excoriation, Fluctuance, Friable, Induration, Localized Edema, Rash, Scarring, Dry/Scaly, Maceration, Atrophie Blanche, Cyanosis, Ecchymosis, Hemosiderin Staining, Mottled, Pallor, Rubor, Erythema. Periwound temperature was noted as No Abnormality. Wound #5 status is Open. Original cause of wound was Gradually Appeared. The wound is located on the Right,Medial,Anterior Lower Leg. The wound measures 0.5cm length x 0.4cm width x 0.1cm depth; 0.157cm^2 area and 0.016cm^3 volume. The wound is limited to skin breakdown. There is a none present amount of drainage noted. The wound margin is distinct with the outline attached to the wound base. There is no granulation within the wound bed. There is a large (67-100%) amount of necrotic tissue within the wound bed including Eschar. The periwound skin appearance did not exhibit: Callus, Crepitus, Excoriation, Fluctuance, Friable, Induration, Localized Edema, Rash, Scarring, Dry/Scaly, Maceration, Moist, Atrophie Blanche, Cyanosis, Ecchymosis, Hemosiderin Staining, Mottled, Pallor, Rubor, Erythema. Assessment Active Problems ICD-10 E11.622 - Type 2 diabetes mellitus with other skin ulcer L89.112 - Pressure ulcer of right upper back, stage 2 S80.821A - Blister (nonthermal), right lower leg, initial encounter N18.6 - End stage renal disease We will use Mepitel and a bolster dressing on the back and use the pressure to control hypergranulation tissue and promote healing. as far as the leg goes there is minimum eschar and I have recommended a Mepitel and a bordered foam. Plan Tiller, Marjani S. ([id number]) Wound Cleansing: Wound #1 Right Back: Clean wound with Normal Saline. Wound #5 Right,Medial,Anterior Lower Leg: Clean wound with Normal Saline. Primary Wound Dressing: Wound #1  Right Back: Mepitel One Wound #5 Right,Medial,Anterior Lower Leg: Mepitel One Secondary Dressing: Wound #1 Right Back: Dry Gauze Boardered Foam Dressing Wound #5 Right,Medial,Anterior Lower Leg: Boardered Foam Dressing Dressing Change Frequency: Wound #1 Right Back: Other: - May change outer dressing as needed for drainage. Wound #5 Right,Medial,Anterior Lower Leg: Other: - May change outer dressing as needed for drainage. Follow-up Appointments: Wound #1 Right Back: Return Appointment in 1 week. We will use Mepitel and a bolster dressing on the back and use the pressure to control hypergranulation tissue and promote healing. as far as the leg goes there is minimum eschar and I have recommended a Mepitel and a bordered foam. Electronic Signature(s) Signed: 04/07/2015 1:26:12 PM By:  [name], [name] Entered By: [name] on 04/07/2015 13:26:12 Riquelme, [name] ([id number]) -------------------------------------------------------------------------------- SuperBill Details Patient Name: [name] Date of Service: 04/07/2015 Medical Record Number: [id number] Patient Account Number: [id number] Date of Birth/Sex: 10/01/1955 (60 y.o. Female) Treating RN: Cornell Barman Primary Care Physician: Steele Sizer Other Clinician: Referring Physician: Steele Sizer Treating Physician/Extender: Frann Rider in Treatment: 15 Diagnosis Coding ICD-10 Codes Code Description E11.622 Type 2 diabetes mellitus with other skin ulcer L89.112 Pressure ulcer of right upper back, stage 2 S80.821A Blister (nonthermal), right lower leg, initial encounter N18.6 End stage renal disease Facility Procedures CPT4 Code: [id number] Description: 99213 - WOUND CARE VISIT-LEV 3 EST PT Modifier: Quantity: 1 Physician Procedures CPT4 Code: [id number] Description: 93570 - WC PHYS LEVEL 3 - EST PT ICD-10 Description Diagnosis E11.622 Type 2 diabetes mellitus with other skin ulcer L89.112 Pressure  ulcer of right upper back, stage 2 S80.821A Blister (nonthermal), right lower leg, initial enc N18.6 End  stage renal disease Modifier: ounter Quantity: 1 Electronic Signature(s) Signed: 04/07/2015 1:26:25 PM By: [name], [name] Entered By: [name] on 04/07/2015 13:26:25

## 2015-04-07 NOTE — Progress Notes (Signed)
[name], [name] ([id number]) Visit Report for 04/07/2015 Arrival Information Details Patient Name: [name], [name]. Date of Service: 04/07/2015 1:00 PM Medical Record Number: [id number] Patient Account Number: [id number] Date of Birth/Sex: 28-Feb-1956 (59 y.o. Female) Treating [name]: [name] Primary Care Physician: [name] Other Clinician: Referring Physician: Steele [name] Treating Physician/Extender: [name] in Treatment: 15 Visit Information History Since Last Visit Added or deleted any medications: No Patient Arrived: [name] Any new allergies or adverse reactions: No Arrival Time: 13:05 Had a fall or experienced change in No Accompanied By: self activities of daily living that may affect Transfer Assistance: None risk of falls: Patient Identification Verified: Yes Signs or symptoms of abuse/neglect since last No Secondary Verification Process Yes visito Completed: Hospitalized since last visit: No Patient Requires Transmission- No Has Dressing in Place as Prescribed: Yes Based Precautions: Pain Present Now: No Patient Has Alerts: Yes Patient Alerts: Patient on Blood Thinner ABI R: 1.28 Electronic Signature(s) Signed: 04/07/2015 2:12:38 PM By: [name] Cool, [name], [name], [name], [name] Entered By: [name] Cool, [name], [name], [name] on 04/07/2015 13:06:32 [name] ([id number]) -------------------------------------------------------------------------------- Clinic Level of Care Assessment Details [name]. Date of Service: 04/07/2015 1:00 PM Medical Record Number: [id number] Patient Account Number: [id number] Date of Birth/Sex: 1956-06-19 (59 y.o. Female) Treating [name]: [name] Primary Care Physician: [name] Other Clinician: Referring Physician: Steele [name] Treating Physician/Extender: [name] in Treatment: 15 Clinic Level of Care Assessment Items TOOL 4 Quantity Score []  - Use when only an EandM is performed on FOLLOW-UP visit  0 ASSESSMENTS - Nursing Assessment / Reassessment X - Reassessment of Co-morbidities (includes updates in patient status) 1 10 X - Reassessment of Adherence to Treatment Plan 1 5 ASSESSMENTS - Wound and Skin Assessment / Reassessment X - Simple Wound Assessment / Reassessment - one wound 1 5 []  - Complex Wound Assessment / Reassessment - multiple wounds 0 []  - Dermatologic / Skin Assessment (not related to wound area) 0 ASSESSMENTS - Focused Assessment []  - Circumferential Edema Measurements - multi extremities 0 []  - Nutritional Assessment / Counseling / Intervention 0 []  - Lower Extremity Assessment (monofilament, tuning fork, pulses) 0 []  - Peripheral Arterial Disease Assessment (using hand held doppler) 0 ASSESSMENTS - Ostomy and/or Continence Assessment and Care []  - Incontinence Assessment and Management 0 []  - Ostomy Care Assessment and Management (repouching, etc.) 0 PROCESS - Coordination of Care X - Simple Patient / Family Education for ongoing care 1 15 []  - Complex (extensive) Patient / Family Education for ongoing care 0 X - Staff obtains Programmer, systems, Records, Test Results / Process Orders 1 10 []  - Staff telephones HHA, Nursing Homes / Clarify orders / etc 0 []  - Routine Transfer to another Facility (non-emergent condition) 0 [name], [name] ([id number]) []  - Routine Hospital Admission (non-emergent condition) 0 []  - New Admissions / Biomedical engineer / Ordering NPWT, Apligraf, etc. 0 []  - Emergency Hospital Admission (emergent condition) 0 X - Simple Discharge Coordination 1 10 []  - Complex (extensive) Discharge Coordination 0 PROCESS - Special Needs []  - Pediatric / Minor Patient Management 0 []  - Isolation Patient Management 0 []  - Hearing / Language / Visual special needs 0 []  - Assessment of Community assistance (transportation, D/C planning, etc.) 0 []  - Additional assistance / Altered mentation 0 []  - Support Surface(s) Assessment (bed, cushion, seat, etc.)  0 INTERVENTIONS - Wound Cleansing / Measurement X - Simple Wound Cleansing - one wound 1 5 []  - Complex Wound Cleansing - multiple  wounds 0 X - Wound Imaging (photographs - any number of wounds) 1 5 []  - Wound Tracing (instead of photographs) 0 X - Simple Wound Measurement - one wound 1 5 []  - Complex Wound Measurement - multiple wounds 0 INTERVENTIONS - Wound Dressings []  - Small Wound Dressing one or multiple wounds 0 X - Medium Wound Dressing one or multiple wounds 2 15 []  - Large Wound Dressing one or multiple wounds 0 []  - Application of Medications - topical 0 []  - Application of Medications - injection 0 INTERVENTIONS - Miscellaneous []  - External ear exam 0 [name], [name] ([id number]) []  - Specimen Collection (cultures, biopsies, blood, body fluids, etc.) 0 []  - Specimen(s) / Culture(s) sent or taken to Lab for analysis 0 []  - Patient Transfer (multiple staff / Harrel Lemon Lift / Similar devices) 0 []  - Simple Staple / Suture removal (25 or less) 0 []  - Complex Staple / Suture removal (26 or more) 0 []  - Hypo / Hyperglycemic Management (close monitor of Blood Glucose) 0 []  - Ankle / Brachial Index (ABI) - do not check if billed separately 0 X - Vital Signs 1 5 Has the patient been seen at the hospital within the last three years: Yes Total Score: 105 Level Of Care: New/Established - Level 3 Electronic Signature(s) Signed: 04/07/2015 2:12:38 PM By: [name] Cool, [name], [name], [name], [name] Entered By: [name] Cool, [name], [name], [name] on 04/07/2015 13:21:05 [name] ([id number]) -------------------------------------------------------------------------------- Encounter Discharge Information Details [name], [name]. Date of Service: 04/07/2015 1:00 PM Medical Record Number: [id number] Patient Account Number: [id number] Date of Birth/Sex: 1956-01-06 (59 y.o. Female) Treating [name]: [name] Primary Care Physician: [name] Other Clinician: Referring Physician: Steele [name] Treating Physician/Extender: [name] in Treatment: 15 Encounter Discharge Information Items Discharge Pain Level: 0 Discharge Condition: Stable Ambulatory Status: Walker Discharge Destination: Nursing Home Transportation: Private Auto Accompanied By: self Schedule Follow-up Appointment: Yes Medication Reconciliation completed Yes and provided to Patient/Care Hope Brandenburger: Provided on Clinical Summary of Care: 04/07/2015 Form Type Recipient Paper Patient LD Electronic Signature(s) Signed: 04/07/2015 1:27:05 PM By: Ruthine Dose Entered By: Ruthine Dose on 04/07/2015 13:27:05 Prell, [name] ([id number]) -------------------------------------------------------------------------------- Lower Extremity Assessment Details Patient Name: [name], [name] Date of Service: 04/07/2015 1:00 PM Medical Record Number: [id number] Patient Account Number: [id number] Date of Birth/Sex: Dec 01, 1955 (59 y.o. Female) Treating [name]: [name] Primary Care Physician: [name] Other Clinician: Referring Physician: Steele [name] Treating Physician/Extender: [name] in Treatment: 15 Vascular Assessment Pulses: Posterior Tibial Dorsalis Pedis Palpable: [Right:Yes] Extremity colors, hair growth, and conditions: Extremity Color: [Right:Normal] Hair Growth on Extremity: [Right:No] Temperature of Extremity: [Right:Warm] Capillary Refill: [Right:< 3 seconds] Toe Nail Assessment Left: Right: Thick: No Discolored: No Deformed: No Improper Length and Hygiene: No Electronic Signature(s) Signed: 04/07/2015 2:12:38 PM By: [name] Cool, [name], [name], [name], [name] Entered By: [name] Cool, [name], [name], [name] on 04/07/2015 13:17:00 Kretchmer, [name] ([id number]) -------------------------------------------------------------------------------- Multi Wound Chart Details [name]. Date of Service: 04/07/2015 1:00 PM Medical Record Number: [id number] Patient Account Number:  [id number] Date of Birth/Sex: 1955/10/11 (59 y.o. Female) Treating [name]: [name] Primary Care Physician: [name] Other Clinician: Referring Physician: Steele [name] Treating Physician/Extender: [name] in Treatment: 15 Vital Signs Height(in): 65 Pulse(bpm): 92 Weight(lbs): 156 Blood Pressure 170/74 (mmHg): Body Mass Index(BMI): 26 Temperature(F): 98.1 Respiratory Rate 18 (breaths/min): Photos: [1:No Photos] [5:No Photos] [N/A:N/A] Wound Location: [1:Right Back] [5:Right Lower Leg - Medial, N/A Anterior] Wounding Event: [1:Blister] [5:Gradually Appeared] [N/A:N/A]  Primary Etiology: [1:Pressure Ulcer] [5:Cellulitis] [N/A:N/A] Comorbid History: [1:Glaucoma, Anemia, Coronary Artery Disease, Coronary Artery Disease, Hypertension, Type II Diabetes, End Stage Renal Disease, Osteoarthritis, Neuropathy Osteoarthritis, Neuropathy] [5:Glaucoma, Anemia, Hypertension, Type II Diabetes,  End Stage Renal Disease,] [N/A:N/A] Date Acquired: [1:10/19/2013] [5:03/30/2015] [N/A:N/A] Weeks of Treatment: [1:15] [5:0] [N/A:N/A] Wound Status: [1:Open] [5:Open] [N/A:N/A] Measurements L x W x D 2.7x3x0.1 [5:0.5x0.4x0.1] [N/A:N/A] (cm) Area (cm) : [1:6.362] [5:0.157] [N/A:N/A] Volume (cm) : [1:0.636] [5:0.016] [N/A:N/A] % Reduction in Area: [1:83.50%] [5:0.00%] [N/A:N/A] % Reduction in Volume: 83.50% [5:0.00%] [N/A:N/A] Classification: [1:Category/Stage II] [5:Partial Thickness] [N/A:N/A] HBO Classification: [1:N/A] [5:Unable to visualize wound N/A bed] Exudate Amount: [1:Medium] [5:None Present] [N/A:N/A] Exudate Type: [1:Sanguinous] [5:N/A] [N/A:N/A] Exudate Color: [1:red] [5:N/A] [N/A:N/A] Wound Margin: [1:Distinct, outline attached Distinct, outline attached N/A] Granulation Amount: [1:Large (67-100%)] [5:None Present (0%)] [N/A:N/A] Granulation Quality: [1:Red, Friable] [5:N/A] [N/A:N/A] Necrotic Amount: [1:None Present (0%)] [5:Large (67-100%)] [N/A:N/A] Necrotic Tissue:  N/A Eschar N/A Exposed Structures: Fascia: No Fascia: No N/A Fat: No Fat: No Tendon: No Tendon: No Muscle: No Muscle: No Joint: No Joint: No Bone: No Bone: No Limited to Skin Limited to Skin Breakdown Breakdown Epithelialization: Medium (34-66%) N/A N/A Periwound Skin Texture: Edema: No Edema: No N/A Excoriation: No Excoriation: No Induration: No Induration: No Callus: No Callus: No Crepitus: No Crepitus: No Fluctuance: No Fluctuance: No Friable: No Friable: No Rash: No Rash: No Scarring: No Scarring: No Periwound Skin Moist: Yes Maceration: No N/A Moisture: Maceration: No Moist: No Dry/Scaly: No Dry/Scaly: No Periwound Skin Color: Atrophie Blanche: No Atrophie Blanche: No N/A Cyanosis: No Cyanosis: No Ecchymosis: No Ecchymosis: No Erythema: No Erythema: No Hemosiderin Staining: No Hemosiderin Staining: No Mottled: No Mottled: No Pallor: No Pallor: No Rubor: No Rubor: No Temperature: No Abnormality N/A N/A Tenderness on No No N/A Palpation: Wound Preparation: Ulcer Cleansing: Ulcer Cleansing: N/A Rinsed/Irrigated with Rinsed/Irrigated with Saline Saline, Not Cleansed Topical Anesthetic Topical Anesthetic Applied: None Applied: Other Treatment Notes Electronic Signature(s) Signed: 04/07/2015 2:12:38 PM By: [name] Cool, [name], [name], [name], [name] Entered By: [name] Cool, [name], [name], [name] on 04/07/2015 13:17:14 Brandau, [name] ([id number]) -------------------------------------------------------------------------------- [place] Details [name], [name]. Date of Service: 04/07/2015 1:00 PM Medical Record Number: [id number] Patient Account Number: [id number] Date of Birth/Sex: 10-29-1955 (59 y.o. Female) Treating [name]: [name] Primary Care Physician: [name] Other Clinician: Referring Physician: Steele [name] Treating Physician/Extender: [name] in Treatment: 15 Active Inactive Abuse / Safety / Falls / Self  Care Management Nursing Diagnoses: Potential for falls Goals: Patient will remain injury free Date Initiated: 12/23/2014 Goal Status: Active Patient/caregiver will verbalize understanding of skin care regimen Date Initiated: 12/23/2014 Goal Status: Active Patient/caregiver will verbalize/demonstrate measures taken to prevent injury and/or falls Date Initiated: 12/23/2014 Goal Status: Active Patient/caregiver will verbalize/demonstrate understanding of what to do in case of emergency Date Initiated: 12/23/2014 Goal Status: Active Interventions: Assess fall risk on admission and as needed Assess: immobility, friction, shearing, incontinence upon admission and as needed Assess impairment of mobility on admission and as needed per policy Assess self care needs on admission and as needed Provide education on fall prevention Notes: Wound/Skin Impairment Nursing Diagnoses: Impaired tissue integrity Knowledge deficit related to ulceration/compromised skin integrity Goals: Patient/caregiver will verbalize understanding of skin care regimen [name], [name] ([id number]) Date Initiated: 12/23/2014 Goal Status: Active Ulcer/skin breakdown will heal within 14 weeks Date Initiated: 12/23/2014 Goal Status: Active Interventions: Assess patient/caregiver ability to obtain necessary supplies Assess patient/caregiver ability to perform ulcer/skin care regimen upon admission and as  needed Assess ulceration(s) every visit Provide education on ulcer and skin care Treatment Activities: Skin care regimen initiated : 04/07/2015 Topical wound management initiated : 04/07/2015 Notes: Electronic Signature(s) Signed: 04/07/2015 2:12:38 PM By: [name] Cool, [name], [name], [name], [name] Entered By: [name] Cool, [name], [name], [name] on 04/07/2015 13:17:08 Butkiewicz, [name] ([id number]) -------------------------------------------------------------------------------- Pain Assessment Details [name], [name]. Date of Service: 04/07/2015  1:00 PM Medical Record Number: [id number] Patient Account Number: [id number] Date of Birth/Sex: 11/07/1955 (59 y.o. Female) Treating [name]: [name] Primary Care Physician: [name] Other Clinician: Referring Physician: Steele [name] Treating Physician/Extender: [name] in Treatment: 15 Active Problems Location of Pain Severity and Description of Pain Patient Has Paino No Site Locations Pain Management and Medication Current Pain Management: Electronic Signature(s) Signed: 04/07/2015 2:12:38 PM By: [name] Cool, [name], [name], [name], [name] Entered By: [name] Cool, [name], [name], [name] on 04/07/2015 13:06:46 [name], [name] ([id number]) -------------------------------------------------------------------------------- Patient/Caregiver Education Details [name]. Date of Service: 04/07/2015 1:00 PM Medical Record Number: [id number] Patient Account Number: [id number] Date of Birth/Gender: [birth year] (59 y.o. Female) Treating [name]: [name] Primary Care Physician: [name] Other Clinician: Referring Physician: Steele [name] Treating Physician/Extender: [name] in Treatment: 15 Education Assessment Education Provided To: Patient Education Topics Provided Wound/Skin Impairment: Handouts: Caring for Your Ulcer, Other: continue wound care as prescribed Methods: Demonstration, Explain/Verbal Responses: State content correctly Electronic Signature(s) Signed: 04/07/2015 2:12:38 PM By: [name] Cool, [name], [name], [name], [name] Entered By: [name] Cool, [name], [name], [name] on 04/07/2015 13:26:48 [name], [name] ([id number]) -------------------------------------------------------------------------------- Wound Assessment Details [name], [name]. Date of Service: 04/07/2015 1:00 PM Medical Record Number: [id number] Patient Account Number: [id number] Date of Birth/Sex: 1955-10-19 (59 y.o. Female) Treating [name]: [name] Primary Care Physician: [name] Other  Clinician: Referring Physician: Steele [name] Treating Physician/Extender: [name] in Treatment: 15 Wound Status Wound Number: 1 Primary Pressure Ulcer Etiology: Wound Location: Right Back Wound Open Wounding Event: Blister Status: Date Acquired: 10/19/2013 Comorbid Glaucoma, Anemia, Coronary Artery Weeks Of Treatment: 15 History: Disease, Hypertension, Type II Clustered Wound: No Diabetes, End Stage Renal Disease, Osteoarthritis, Neuropathy Wound Measurements Length: (cm) 2.7 Width: (cm) 3 Depth: (cm) 0.1 Area: (cm) 6.362 Volume: (cm) 0.636 % Reduction in Area: 83.5% % Reduction in Volume: 83.5% Epithelialization: Medium (34-66%) Wound Description Classification: Category/Stage II Wound Margin: Distinct, outline attached Exudate Amount: Medium Exudate Type: Sanguinous Exudate Color: red Foul Odor After Cleansing: No Wound Bed Granulation Amount: Large (67-100%) Exposed Structure Granulation Quality: Red, Friable Fascia Exposed: No Necrotic Amount: None Present (0%) Fat Layer Exposed: No Tendon Exposed: No Muscle Exposed: No Joint Exposed: No Bone Exposed: No Limited to Skin Breakdown Periwound Skin Texture Texture Color No Abnormalities Noted: No No Abnormalities Noted: No Callus: No Atrophie Blanche: No Crepitus: No Cyanosis: No Garis, Naomi S. ([id number]) Excoriation: No Ecchymosis: No Fluctuance: No Erythema: No Friable: No Hemosiderin Staining: No Induration: No Mottled: No Localized Edema: No Pallor: No Rash: No Rubor: No Scarring: No Temperature / Pain Moisture Temperature: No Abnormality No Abnormalities Noted: No Dry / Scaly: No Maceration: No Moist: Yes Wound Preparation Ulcer Cleansing: Rinsed/Irrigated with Saline Topical Anesthetic Applied: None Treatment Notes Wound #1 (Right Back) 1. Cleansed with: Clean wound with Normal Saline 2. Anesthetic Topical Lidocaine 4% cream to wound bed prior to debridement 4.  Dressing Applied: Mepitel 5. Secondary Dressing Applied Bordered Foam Dressing Electronic Signature(s) Signed: 04/07/2015 2:12:38 PM By: [name] Cool, [name], [name], [name], [name] Entered By: [name] Cool, [name], [name], [name] on  04/07/2015 13:14:14 Manka, [name] ([id number]) -------------------------------------------------------------------------------- Wound Assessment Details Patient Name: JALYSA, SWOPES. Date of Service: 04/07/2015 1:00 PM Medical Record Number: [id number] Patient Account Number: [id number] Date of Birth/Sex: July 19, 1955 (59 y.o. Female) Treating [name]: [name] Primary Care Physician: [name] Other Clinician: Referring Physician: Steele [name] Treating Physician/Extender: [name] in Treatment: 15 Wound Status Wound Number: 5 Primary Cellulitis Etiology: Wound Location: Right Lower Leg - Medial, Anterior Wound Open Status: Wounding Event: Gradually Appeared Comorbid Glaucoma, Anemia, Coronary Artery Date Acquired: 03/30/2015 History: Disease, Hypertension, Type II Weeks Of Treatment: 0 Diabetes, End Stage Renal Disease, Clustered Wound: No Osteoarthritis, Neuropathy Photos Photo Uploaded By: [name] Cool, [name], [name], [name] on 04/07/2015 17:29:13 Wound Measurements Length: (cm) 0.5 Width: (cm) 0.4 Depth: (cm) 0.1 Area: (cm) 0.157 Volume: (cm) 0.016 % Reduction in Area: 0% % Reduction in Volume: 0% Wound Description Classification: Partial Thickness Diabetic Severity Unable to visualize wound Earleen Newport): bed Barretto, [name] ([id number]) Wound Margin: Distinct, outline attached Exudate Amount: None Present Wound Bed Granulation Amount: None Present (0%) Exposed Structure Necrotic Amount: Large (67-100%) Fascia Exposed: No Necrotic Quality: Eschar Fat Layer Exposed: No Tendon Exposed: No Muscle Exposed: No Joint Exposed: No Bone Exposed: No Limited to Skin Breakdown Periwound Skin Texture Texture Color No Abnormalities Noted: No No Abnormalities Noted:  No Callus: No Atrophie Blanche: No Crepitus: No Cyanosis: No Excoriation: No Ecchymosis: No Fluctuance: No Erythema: No Friable: No Hemosiderin Staining: No Induration: No Mottled: No Localized Edema: No Pallor: No Rash: No Rubor: No Scarring: No Moisture No Abnormalities Noted: No Dry / Scaly: No Maceration: No Moist: No Wound Preparation Ulcer Cleansing: Rinsed/Irrigated with Saline, Not Cleansed Topical Anesthetic Applied: Other Treatment Notes Wound #5 (Right, Medial, Anterior Lower Leg) 1. Cleansed with: Clean wound with Normal Saline 2. Anesthetic Topical Lidocaine 4% cream to wound bed prior to debridement 4. Dressing Applied: Mepitel 5. Secondary Dressing Applied Bordered Foam Dressing Electronic Signature(s) MANDIE, CRABBE ([id number]) Signed: 04/07/2015 2:12:38 PM By: [name] Cool, [name], [name], [name], [name] Entered By: [name] Cool, [name], [name], [name] on 04/07/2015 13:13:56 Fromme, [name] ([id number]) -------------------------------------------------------------------------------- Vitals Details [name]. Date of Service: 04/07/2015 1:00 PM Medical Record Number: [id number] Patient Account Number: [id number] Date of Birth/Sex: 25-Oct-1955 (59 y.o. Female) Treating [name]: [name] Primary Care Physician: [name] Other Clinician: Referring Physician: Steele [name] Treating Physician/Extender: [name] in Treatment: 15 Vital Signs Time Taken: 13:06 Temperature (F): 98.1 Height (in): 65 Pulse (bpm): 92 Weight (lbs): 156 Respiratory Rate (breaths/min): 18 Body Mass Index (BMI): 26 Blood Pressure (mmHg): 170/74 Reference Range: 80 - 120 mg / dl Electronic Signature(s) Signed: 04/07/2015 2:12:38 PM By: [name] Cool, [name], [name], [name], [name] Entered By: [name] Cool, [name], [name], [name] on 04/07/2015 13:07:04

## 2015-04-09 ENCOUNTER — Encounter: Payer: Self-pay | Admitting: *Deleted

## 2015-04-09 ENCOUNTER — Ambulatory Visit
Admission: RE | Admit: 2015-04-09 | Discharge: 2015-04-09 | Disposition: A | Discharge status: Home / Self Care | Payer: Medicare Other | Source: Ambulatory Visit | Attending: Vascular Surgery | Admitting: Vascular Surgery

## 2015-04-09 ENCOUNTER — Encounter
Admission: RE | Disposition: A | Discharge status: Home / Self Care | Payer: Self-pay | Source: Ambulatory Visit | Attending: Vascular Surgery

## 2015-04-09 DIAGNOSIS — N186 End stage renal disease: Secondary | ICD-10-CM | POA: Insufficient documentation

## 2015-04-09 DIAGNOSIS — E1143 Type 2 diabetes mellitus with diabetic autonomic (poly)neuropathy: Secondary | ICD-10-CM | POA: Insufficient documentation

## 2015-04-09 DIAGNOSIS — Z4901 Encounter for fitting and adjustment of extracorporeal dialysis catheter: Secondary | ICD-10-CM | POA: Insufficient documentation

## 2015-04-09 DIAGNOSIS — Z992 Dependence on renal dialysis: Secondary | ICD-10-CM | POA: Insufficient documentation

## 2015-04-09 DIAGNOSIS — J449 Chronic obstructive pulmonary disease, unspecified: Secondary | ICD-10-CM | POA: Diagnosis not present

## 2015-04-09 DIAGNOSIS — I120 Hypertensive chronic kidney disease with stage 5 chronic kidney disease or end stage renal disease: Secondary | ICD-10-CM | POA: Insufficient documentation

## 2015-04-09 DIAGNOSIS — I252 Old myocardial infarction: Secondary | ICD-10-CM | POA: Insufficient documentation

## 2015-04-09 DIAGNOSIS — Z794 Long term (current) use of insulin: Secondary | ICD-10-CM | POA: Insufficient documentation

## 2015-04-09 DIAGNOSIS — I4891 Unspecified atrial fibrillation: Secondary | ICD-10-CM | POA: Insufficient documentation

## 2015-04-09 DIAGNOSIS — I509 Heart failure, unspecified: Secondary | ICD-10-CM | POA: Diagnosis not present

## 2015-04-09 DIAGNOSIS — K859 Acute pancreatitis, unspecified: Secondary | ICD-10-CM | POA: Diagnosis not present

## 2015-04-09 DIAGNOSIS — K219 Gastro-esophageal reflux disease without esophagitis: Secondary | ICD-10-CM | POA: Diagnosis not present

## 2015-04-09 DIAGNOSIS — E1122 Type 2 diabetes mellitus with diabetic chronic kidney disease: Secondary | ICD-10-CM | POA: Diagnosis not present

## 2015-04-09 DIAGNOSIS — I2510 Atherosclerotic heart disease of native coronary artery without angina pectoris: Secondary | ICD-10-CM | POA: Diagnosis not present

## 2015-04-09 HISTORY — PX: PERIPHERAL VASCULAR CATHETERIZATION: SHX172C

## 2015-04-09 SURGERY — DIALYSIS/PERMA CATHETER REMOVAL
Anesthesia: Moderate Sedation

## 2015-04-09 MED ORDER — BACITRACIN-NEOMYCIN-POLYMYXIN 400-5-5000 EX OINT
TOPICAL_OINTMENT | CUTANEOUS | Status: AC
  Filled 2015-04-09: qty 1

## 2015-04-09 SURGICAL SUPPLY — 8 items
DRSG TEGADERM 4X4.75 (GAUZE/BANDAGES/DRESSINGS) ×1 IMPLANT
GLOVE SURG SYN 7.0 (GLOVE) ×2 IMPLANT
GLOVE SURG SYN 7.0 PF PI (GLOVE) IMPLANT
GLOVE SURG SYN 8.0 (GLOVE) ×2 IMPLANT
GLOVE SURG SYN 8.0 PF PI (GLOVE) IMPLANT
HEMOSTAT SURGICEL 2X3 (HEMOSTASIS) ×1 IMPLANT
SCALPEL PROTECTED #11 DISP (BLADE) ×1 IMPLANT
TRAY LACERAT/PLASTIC (MISCELLANEOUS) ×1 IMPLANT

## 2015-04-09 NOTE — Op Note (Signed)
Operative Note     Preoperative diagnosis:   1. ESRD with functional permanent access  Postoperative diagnosis:  1. ESRD with functional permanent access  Procedure:  Removal of right jugular Permcath  Surgeon:  Leotis Pain, MD  Anesthesia:  Local  EBL:  Minimal  Indication for the Procedure:  The patient has a functional permanent dialysis access and no longer needs their permcath.  This can be removed.  Risks and benefits are discussed and informed consent is obtained.  Description of the Procedure:  The patient's right neck, chest and existing catheter were sterilely prepped and draped. The area around the catheter was anesthetized copiously with 1% lidocaine. The catheter was dissected out with curved hemostats until the cuff was freed from the surrounding fibrous sheath. The fiber sheath was transected, and the catheter was then removed in its entirety using gentle traction. Pressure was held and sterile dressings were placed. The patient tolerated the procedure well and was taken to the recovery room in stable condition.     DEW,JASON  04/09/2015, 9:24 AM

## 2015-04-09 NOTE — H&P (Signed)
[facility] Admission History & Physical  MRN : [id number]  [name] is a 59 y.o. (20-Nov-1955) female who presents with chief complaint of No chief complaint on file. Marland Kitchen  History of Present Illness: Patient is a 59 year old female with end-stage renal disease. Her permanent dialysis access is now functional and she can have her catheter removed. She is going to the wound care center for chronic wounds that have been present now for several months. She otherwise has no complaints today.  Current Facility-Administered Medications  Medication Dose Route Frequency Provider Last Rate Last Dose  . insulin aspart (novoLOG) injection 0-24 Units  0-24 Units Subcutaneous 6 times per day Sela Hua, PA-C        Past Medical History  Diagnosis Date  . Diabetes   . COPD (chronic obstructive pulmonary disease)   . CKD (chronic kidney disease)   . CAD (coronary artery disease)   . HTN (hypertension)   . MI (myocardial infarction) 05-17-2014  . CHF (congestive heart failure)   . Atrial fibrillation   . GERD (gastroesophageal reflux disease)   . Pancreatitis   . Peripheral autonomic neuropathy due to diabetes mellitus   . Diabetic peripheral neuropathy associated with type 2 diabetes mellitus   . Dialysis patient     Past Surgical History  Procedure Laterality Date  . Left heart catheterization with coronary angiogram N/A 05/20/2014    Procedure: LEFT HEART CATHETERIZATION WITH CORONARY ANGIOGRAM;  Surgeon: Clent Demark, MD;  Location: [facility] CATH LAB;  Service: Cardiovascular;  Laterality: N/A;  . Coronary angioplasty      Cardiac stents  . Back surgery    . Eye surgery      bilateral cataract extractions  . Abdominal hysterectomy    . Cholecystectomy    . Portacath placement    . Av fistula placement Left 12/25/2014    Procedure: ARTERIOVENOUS (AV) FISTULA CREATION;  Surgeon: Algernon Huxley, MD;  Location: [facility] ORS;  Service: Vascular;  Laterality:  Left;  . Peripheral vascular catheterization N/A 02/12/2015    Procedure: A/V Shuntogram/Fistulagram;  Surgeon: Algernon Huxley, MD;  Location: [place];  Service: Cardiovascular;  Laterality: N/A;  . Peripheral vascular catheterization N/A 02/12/2015    Procedure: A/V Shunt Intervention;  Surgeon: Algernon Huxley, MD;  Location: [place] [facility];  Service: Cardiovascular;  Laterality: N/A;    Social History Social History  Substance Use Topics  . Smoking status: Never Smoker   . Smokeless tobacco: Never Used  . Alcohol Use: No  lives in assisted living with husband  Family History Family History  Problem Relation Age of Onset  . Diabetes Mother   . Hypertension Mother   No bleeding disorders or clotting disorders  Allergies  Allergen Reactions  . Adhesive [Tape] Other (See Comments)    "blisters"  . Other     pilogel burns like fire.  Seafood - intolerant   . Doxycycline Rash     REVIEW OF SYSTEMS (Negative unless checked)  Constitutional: [] Weight loss  [] Fever  [] Chills Cardiac: [] Chest pain   [] Chest pressure   [x] Palpitations   [] Shortness of breath when laying flat   [] Shortness of breath at rest   [x] Shortness of breath with exertion. Vascular:  [] Pain in legs with walking   [] Pain in legs at rest   [] Pain in legs when laying flat   [] Claudication   [] Pain in feet when walking  [] Pain in feet at rest  []   Pain in feet when laying flat   [] History of DVT   [] Phlebitis   [] Swelling in legs   [] Varicose veins   [] Non-healing ulcers Pulmonary:   [] Uses home oxygen   [] Productive cough   [] Hemoptysis   [] Wheeze  [] COPD   [] Asthma Neurologic:  [] Dizziness  [] Blackouts   [] Seizures   [] History of stroke   [] History of TIA  [] Aphasia   [] Temporary blindness   [] Dysphagia   [] Weakness or numbness in arms   [] Weakness or numbness in legs Musculoskeletal:  [] Arthritis   [] Joint swelling   [] Joint pain   [] Low back pain Hematologic:  [] Easy bruising  [] Easy bleeding    [] Hypercoagulable state   [] Anemic  [] Hepatitis Gastrointestinal:  [] Blood in stool   [] Vomiting blood  [] Gastroesophageal reflux/heartburn   [] Difficulty swallowing. Genitourinary:  [x] Chronic kidney disease   [] Difficult urination  [] Frequent urination  [] Burning with urination   [] Blood in urine Skin:  [x] Rashes   [] Ulcers   [x] Wounds Psychological:  [] History of anxiety   []  History of major depression.  Physical Examination  Filed Vitals:   04/09/15 0854  BP: 151/81  Pulse: 93  Temp: 98.9 F (37.2 C)  TempSrc: Oral  Height: 5\' 4"  (1.626 m)  Weight: 71.215 kg (157 lb)  SpO2: 96%   Body mass index is 26.94 kg/(m^2). Gen: WD/WN, NAD Head: [place]/AT, No temporalis wasting. Prominent temp pulse not noted. Ear/Nose/Throat: Hearing grossly intact, nares w/o erythema or drainage, oropharynx w/o Erythema/Exudate,  Eyes: PERRLA, EOMI.  Neck: Supple, no nuchal rigidity.  No bruit or JVD.  Pulmonary:  Good air movement, clear to auscultation bilaterally, no use of accessory muscles.  Cardiac: RRR, normal S1, S2, no Murmurs, rubs or gallops. Vascular: left arm access with good thrill and bruit, catheter in right chest Vessel Right Left  Radial Palpable Palpable                                   Gastrointestinal: soft, non-tender/non-distended. No guarding/reflex.  Musculoskeletal: M/S 5/5 throughout.  Extremities without ischemic changes.  No deformity or atrophy.  Neurologic: CN 2-12 intact. Pain and light touch intact in extremities.  Symmetrical.  Speech is fluent. Motor exam as listed above. Psychiatric: Judgment intact, Mood & affect appropriate for pt's clinical situation. Dermatologic: No rashes or ulcers noted.  No cellulitis or open wounds. Lymph : No Cervical, Axillary, or Inguinal lymphadenopathy.      CBC Lab Results  Component Value Date   WBC 7.6 12/25/2014   HGB 12.3 12/25/2014   HCT 37.4 12/25/2014   MCV 90.6 12/25/2014   PLT 185 12/25/2014    BMET     Component Value Date/Time   NA 138 12/25/2014 1152   NA 143 11/06/2014 1942   K 4.3 12/25/2014 1152   K 3.7 11/06/2014 1942   CL 95* 12/25/2014 1152   CL 102 11/06/2014 1942   CO2 33* 12/25/2014 1152   CO2 30 11/06/2014 1942   GLUCOSE 221* 12/25/2014 1152   GLUCOSE 131* 11/06/2014 1942   BUN 33* 12/25/2014 1152   BUN 49* 11/06/2014 1942   CREATININE 3.78* 12/25/2014 1152   CREATININE 3.50* 11/06/2014 1942   CALCIUM 9.2 12/25/2014 1152   CALCIUM 9.5 11/06/2014 1942   GFRNONAA 12* 12/25/2014 1152   GFRNONAA 13* 11/06/2014 1942   GFRNONAA 15* 08/09/2014 1408   GFRAA 14* 12/25/2014 1152   GFRAA 16* 11/06/2014 1942   GFRAA 18*  08/09/2014 1408   CrCl cannot be calculated (Patient has no serum creatinine result on file.).  COAG Lab Results  Component Value Date   INR 0.9 11/06/2014   INR 1.38 05/20/2014   INR 1.0 05/17/2014    Radiology No results found.    Assessment/Plan 1. ESRD.  Has functional dialysis access and can have her catheter removed. 2. Diabetes. Stable. Continue outpatient meds 3. COPD. Stable    DEW,JASON, MD  04/09/2015 9:10 AM

## 2015-04-10 ENCOUNTER — Encounter: Payer: Self-pay | Admitting: Vascular Surgery

## 2015-04-14 ENCOUNTER — Encounter: Payer: Medicare Other | Admitting: Surgery

## 2015-04-14 DIAGNOSIS — L89112 Pressure ulcer of right upper back, stage 2: Secondary | ICD-10-CM | POA: Diagnosis not present

## 2015-04-14 NOTE — Progress Notes (Addendum)
[name], [name] ([id number]) Visit Report for 04/14/2015 Chief Complaint Document Details [name], [name]. Date of Service: 04/14/2015 1:45 PM Medical Record Number: [id number] Patient Account Number: [id number] Date of Birth/Sex: 02-24-1956 (59 y.o. Female) Treating RN: Montey Hora Primary Care Physician: Steele Sizer Other Clinician: Referring Physician: Steele Sizer Treating Physician/Extender: Frann Rider in Treatment: 16 Information Obtained from: Patient Chief Complaint Patient presents to the wound care center for a consult due non healing wound. She has an open wound on her right upper back which she's had for about a year and she recently noticed a blister on her right lower extremity about 2 weeks ago. Electronic Signature(s) Signed: 04/14/2015 2:21:36 PM By: [name], [name] Entered By: [name] on 04/14/2015 14:21:36 [name], [name] ([id number]) -------------------------------------------------------------------------------- HPI Details [name], [name]. Date of Service: 04/14/2015 1:45 PM Medical Record Number: [id number] Patient Account Number: [id number] Date of Birth/Sex: [birth year] (59 y.o. Female) Treating RN: Montey Hora Primary Care Physician: Steele Sizer Other Clinician: Referring Physician: Steele Sizer Treating Physician/Extender: Frann Rider in Treatment: 16 History of Present Illness Location: right upper back and right lower extremity wounds Quality: Patient reports No Pain. Severity: Patient states wound (s) are getting better. Duration: Patient has had the wound for > 3 months prior to seeking treatment at the wound center Timing: she thought it first occurred when she was using a heating pad about a year ago after back surgery. Context: The wound appeared gradually over time Modifying Factors: Patient is currently on renal dialysis and receives treatments 3 times weekly Associated  Signs and Symptoms: Patient reports having: surgery scheduled for this week for a AV fistula left arm. HPI Description: 59 year old patient who is known to be a diabetic and has end-stage renal disease has had several comorbidities including coronary artery disease, hypertension, hyperlipidemia, pancreatitis, anemia, previous history of hysterectomy, cholecystectomy, left-sided salivary gland excision, bilateral cataract surgery,Peritoneal dialysis catheter, hemodialysis catheter. the area on the back has also been caused by instant pressure she used to sleep on a recliner all day and has significant kyphoscoliosis. As far as the wound on her right lower extremity she's not sure how this blister occurred but she thought it has been there for about 2 weeks. No recent blood investigations available and no recent hemoglobin A1c. 12/30/2014 -- she is an assisted living facility but I believe the nurses that have not followed instructions as she had some cream applied on her back and there was a different dressing. Last week she's had a AV fistula placed on her left forearm. 01/13/2015 -- she has had some localized infection at the port site and she's been on doxycycline for this. 02/05/2015 - he has developed a small blister on her right lower extremity. 02/10/2015 -- she has developed another small blister on her right anterior chest wall in the area where she's had tape for her dialysis access. this may just be injury caused by a tape burn. 02/18/2015 -- no new blisters and she had a dermatology opinion and they have taken a biopsy of her skin. She also had a left brachial AV fistula placed this week. 03/03/2015 -- though we do not have the pathology report yet the patient says she has been put on prednisone because this skin disease is possibly to do with her immune system and her dermatologist is recommended this. 03/10/2015 -- she has developed a new blister which is quite large on her right  lower extremity on the shin.  Electronic Signature(s) Signed: 04/14/2015 2:21:45 PM By: [name], [name] Zentner, [name] ([id number]) Entered By: [name] on 04/14/2015 14:21:44 Alcott, [name] ([id number]) -------------------------------------------------------------------------------- Physical Exam Details [name], [name] Date of Service: 04/14/2015 1:45 PM Medical Record Number: [id number] Patient Account Number: [id number] Date of Birth/Sex: April 19, 1956 (59 y.o. Female) Treating RN: Montey Hora Primary Care Physician: Steele Sizer Other Clinician: Referring Physician: Steele Sizer Treating Physician/Extender: Frann Rider in Treatment: 16 Constitutional . Pulse regular. Respirations normal and unlabored. Afebrile. . Eyes Nonicteric. Reactive to light. Ears, Nose, Mouth, and Throat Lips, teeth, and gums WNL.Marland Kitchen Moist mucosa without lesions . Neck supple and nontender. No palpable supraclavicular or cervical adenopathy. Normal sized without goiter. Respiratory WNL. No retractions.. Cardiovascular Pedal Pulses WNL. No clubbing, cyanosis or edema. Chest Breasts symmetical and no nipple discharge.. Breast tissue WNL, no masses, lumps, or tenderness.. Lymphatic No adneopathy. No adenopathy. No adenopathy. Musculoskeletal Adexa without tenderness or enlargement.. Digits and nails w/o clubbing, cyanosis, infection, petechiae, ischemia, or inflammatory conditions.. Integumentary (Hair, Skin) No suspicious lesions. No crepitus or fluctuance. No peri-wound warmth or erythema. No masses.Marland Kitchen Psychiatric Judgement and insight Intact.. No evidence of depression, anxiety, or agitation.. Notes the wound on right lower extremity is completely healed. The wound on her back looks really good with most of the epithelialization complete except for 2 small satellite lesions. Electronic Signature(s) Signed: 04/14/2015 2:22:27 PM By: [name], [name] Entered  By: [name] on 04/14/2015 14:22:27 [name], [name] ([id number]) -------------------------------------------------------------------------------- Physician Orders Details [name]. Date of Service: 04/14/2015 1:45 PM Medical Record Number: [id number] Patient Account Number: [id number] Date of Birth/Sex: 01-01-1956 (59 y.o. Female) Treating RN: Montey Hora Primary Care Physician: Steele Sizer Other Clinician: Referring Physician: Steele Sizer Treating Physician/Extender: Frann Rider in Treatment: 31 Verbal / Phone Orders: Yes Clinician: Montey Hora Read Back and Verified: Yes Diagnosis Coding ICD-10 Coding Code Description E11.622 Type 2 diabetes mellitus with other skin ulcer L89.112 Pressure ulcer of right upper back, stage 2 S80.821A Blister (nonthermal), right lower leg, initial encounter N18.6 End stage renal disease Wound Cleansing Wound #1 Right Back o Clean wound with Normal Saline. Primary Wound Dressing Wound #1 Right Back o Mepitel One Secondary Dressing Wound #1 Right Back o Dry Gauze o Boardered Foam Dressing Dressing Change Frequency Wound #1 Right Back o Other: - May change outer dressing as needed for drainage. Follow-up Appointments Wound #1 Right Back o Return Appointment in 1 week. Electronic Signature(s) Signed: 04/14/2015 2:22:38 PM By: Montey Hora Signed: 04/14/2015 3:53:46 PM By: [name], [name] Entered By: Montey Hora on 04/14/2015 14:22:38 Schoenberger, [name] ([id number]) [name], [name] ([id number]) -------------------------------------------------------------------------------- Problem List Details [name], [name] Date of Service: 04/14/2015 1:45 PM Medical Record Number: [id number] Patient Account Number: [id number] Date of Birth/Sex: Nov 22, 1955 (59 y.o. Female) Treating RN: Montey Hora Primary Care Physician: Steele Sizer Other Clinician: Referring Physician:  Steele Sizer Treating Physician/Extender: Frann Rider in Treatment: 16 Active Problems ICD-10 Encounter Code Description Active Date Diagnosis E11.622 Type 2 diabetes mellitus with other skin ulcer 12/23/2014 Yes L89.112 Pressure ulcer of right upper back, stage 2 12/23/2014 Yes S80.821A Blister (nonthermal), right lower leg, initial encounter 12/23/2014 Yes N18.6 End stage renal disease 12/23/2014 Yes Inactive Problems Resolved Problems Electronic Signature(s) Signed: 04/14/2015 2:21:29 PM By: [name], [name] Entered By: [name] on 04/14/2015 14:21:28 Kagel, [name] ([id number]) -------------------------------------------------------------------------------- Progress Note Details [name], [name] Date of Service: 04/14/2015  1:45 PM Medical Record Number: [id number] Patient Account Number: [id number] Date of Birth/Sex: 04-29-1956 (59 y.o. Female) Treating RN: Montey Hora Primary Care Physician: Steele Sizer Other Clinician: Referring Physician: Steele Sizer Treating Physician/Extender: Frann Rider in Treatment: 16 Subjective Chief Complaint Information obtained from Patient Patient presents to the wound care center for a consult due non healing wound. She has an open wound on her right upper back which she's had for about a year and she recently noticed a blister on her right lower extremity about 2 weeks ago. History of Present Illness (HPI) The following HPI elements were documented for the patient's wound: Location: right upper back and right lower extremity wounds Quality: Patient reports No Pain. Severity: Patient states wound (s) are getting better. Duration: Patient has had the wound for > 3 months prior to seeking treatment at the wound center Timing: she thought it first occurred when she was using a heating pad about a year ago after back surgery. Context: The wound appeared gradually over time Modifying Factors: Patient  is currently on renal dialysis and receives treatments 3 times weekly Associated Signs and Symptoms: Patient reports having: surgery scheduled for this week for a AV fistula left arm. 59 year old patient who is known to be a diabetic and has end-stage renal disease has had several comorbidities including coronary artery disease, hypertension, hyperlipidemia, pancreatitis, anemia, previous history of hysterectomy, cholecystectomy, left-sided salivary gland excision, bilateral cataract surgery,Peritoneal dialysis catheter, hemodialysis catheter. the area on the back has also been caused by instant pressure she used to sleep on a recliner all day and has significant kyphoscoliosis. As far as the wound on her right lower extremity she's not sure how this blister occurred but she thought it has been there for about 2 weeks. No recent blood investigations available and no recent hemoglobin A1c. 12/30/2014 -- she is an assisted living facility but I believe the nurses that have not followed instructions as she had some cream applied on her back and there was a different dressing. Last week she's had a AV fistula placed on her left forearm. 01/13/2015 -- she has had some localized infection at the port site and she's been on doxycycline for this. 02/05/2015 - he has developed a small blister on her right lower extremity. 02/10/2015 -- she has developed another small blister on her right anterior chest wall in the area where she's had tape for her dialysis access. this may just be injury caused by a tape burn. 02/18/2015 -- no new blisters and she had a dermatology opinion and they have taken a biopsy of her skin. [name], [name] ([id number]) She also had a left brachial AV fistula placed this week. 03/03/2015 -- though we do not have the pathology report yet the patient says she has been put on prednisone because this skin disease is possibly to do with her immune system and her dermatologist  is recommended this. 03/10/2015 -- she has developed a new blister which is quite large on her right lower extremity on the shin. Objective Constitutional Pulse regular. Respirations normal and unlabored. Afebrile. Vitals Time Taken: 2:02 PM, Height: 65 in, Weight: 156 lbs, BMI: 26, Temperature: 98.2 F, Pulse: 91 bpm, Respiratory Rate: 18 breaths/min, Blood Pressure: 151/69 mmHg. Eyes Nonicteric. Reactive to light. Ears, Nose, Mouth, and Throat Lips, teeth, and gums WNL.Marland Kitchen Moist mucosa without lesions . Neck supple and nontender. No palpable supraclavicular or cervical adenopathy. Normal sized without goiter. Respiratory WNL. No retractions.. Cardiovascular Pedal Pulses WNL. No clubbing, cyanosis  or edema. Chest Breasts symmetical and no nipple discharge.. Breast tissue WNL, no masses, lumps, or tenderness.. Lymphatic No adneopathy. No adenopathy. No adenopathy. Musculoskeletal Adexa without tenderness or enlargement.. Digits and nails w/o clubbing, cyanosis, infection, petechiae, ischemia, or inflammatory conditions.Marland Kitchen Psychiatric Judgement and insight Intact.. No evidence of depression, anxiety, or agitation.. General Notes: the wound on right lower extremity is completely healed. The wound on her back looks [name], [name] ([id number]) really good with most of the epithelialization complete except for 2 small satellite lesions. Integumentary (Hair, Skin) No suspicious lesions. No crepitus or fluctuance. No peri-wound warmth or erythema. No masses.. Wound #1 status is Open. Original cause of wound was Blister. The wound is located on the Right Back. The wound measures 2.6cm length x 3.9cm width x 0.1cm depth; 7.964cm^2 area and 0.796cm^3 volume. The wound is limited to skin breakdown. There is no tunneling or undermining noted. There is a medium amount of sanguinous drainage noted. The wound margin is distinct with the outline attached to the wound base. There is large (67-100%)  red, friable granulation within the wound bed. There is no necrotic tissue within the wound bed. The periwound skin appearance exhibited: Moist. The periwound skin appearance did not exhibit: Callus, Crepitus, Excoriation, Fluctuance, Friable, Induration, Localized Edema, Rash, Scarring, Dry/Scaly, Maceration, Atrophie Blanche, Cyanosis, Ecchymosis, Hemosiderin Staining, Mottled, Pallor, Rubor, Erythema. Periwound temperature was noted as No Abnormality. Wound #5 status is Healed - Epithelialized. Original cause of wound was Gradually Appeared. The wound is located on the Right,Medial,Anterior Lower Leg. The wound measures 0cm length x 0cm width x 0cm depth; 0cm^2 area and 0cm^3 volume. Assessment Active Problems ICD-10 E11.622 - Type 2 diabetes mellitus with other skin ulcer L89.112 - Pressure ulcer of right upper back, stage 2 S80.821A - Blister (nonthermal), right lower leg, initial encounter N18.6 - End stage renal disease I have recommended we use Mepitel and a bolster over this an aborted form and leave it on for a week. She can change it only if it gets damp was soaked. She will come back and see me next week. Plan I have recommended we use Mepitel and a bolster over this an aborted form and leave it on for a week. She can change it only if it gets damp was soaked. [name] ([id number]) She will come back and see me next week. Electronic Signature(s) Signed: 04/14/2015 2:23:10 PM By: [name], [name] Entered By: [name] on 04/14/2015 14:23:10 Montee, [name] ([id number]) -------------------------------------------------------------------------------- SuperBill Details [name]. Date of Service: 04/14/2015 Medical Record Number: [id number] Patient Account Number: [id number] Date of Birth/Sex: Dec 23, 1955 (59 y.o. Female) Treating RN: Montey Hora Primary Care Physician: Steele Sizer Other Clinician: Referring Physician: Steele Sizer Treating Physician/Extender: Frann Rider in Treatment: 16 Diagnosis Coding ICD-10 Codes Code Description E11.622 Type 2 diabetes mellitus with other skin ulcer L89.112 Pressure ulcer of right upper back, stage 2 S80.821A Blister (nonthermal), right lower leg, initial encounter N18.6 End stage renal disease Facility Procedures CPT4 Code: [id number] Description: 99213 - WOUND CARE VISIT-LEV 3 EST PT Modifier: Quantity: 1 Physician Procedures CPT4 Code: [id number] Description: 85631 - WC PHYS LEVEL 3 - EST PT ICD-10 Description Diagnosis E11.622 Type 2 diabetes mellitus with other skin ulcer L89.112 Pressure ulcer of right upper back, stage 2 S80.821A Blister (nonthermal), right lower leg, initial enc N18.6 End  stage renal disease Modifier: ounter Quantity: 1 Electronic Signature(s) Signed: 04/14/2015 4:08:11 PM By: Montey Hora Previous Signature: 04/14/2015 2:23:33 PM  Version By: [name], [name] Entered By: Montey Hora on 04/14/2015 16:08:11

## 2015-04-15 NOTE — Progress Notes (Signed)
KEBRINA, FRIEND ([id number]) Visit Report for 04/14/2015 Arrival Information Details [name]. Date of Service: 04/14/2015 1:45 PM Medical Record Number: [id number] Patient Account Number: [id number] Date of Birth/Sex: 1955-11-25 (59 y.o. Female) Treating RN: Montey Hora Primary Care Physician: Steele Sizer Other Clinician: Referring Physician: Steele Sizer Treating Physician/Extender: Frann Rider in Treatment: 17 Visit Information History Since Last Visit Added or deleted any medications: No Patient Arrived: Walker Any new allergies or adverse reactions: No Arrival Time: 14:01 Had a fall or experienced change in No Accompanied By: self activities of daily living that may affect Transfer Assistance: None risk of falls: Patient Identification Verified: Yes Signs or symptoms of abuse/neglect since last No Secondary Verification Process Yes visito Completed: Hospitalized since last visit: No Patient Requires Transmission- No Pain Present Now: No Based Precautions: Patient Has Alerts: Yes Patient Alerts: Patient on Blood Thinner ABI R: 1.28 Electronic Signature(s) Signed: 04/14/2015 4:21:27 PM By: Montey Hora Entered By: Montey Hora on 04/14/2015 14:01:33 [name] ([id number]) -------------------------------------------------------------------------------- Clinic Level of Care Assessment Details [name]. Date of Service: 04/14/2015 1:45 PM Medical Record Number: [id number] Patient Account Number: [id number] Date of Birth/Sex: 07-20-1955 (59 y.o. Female) Treating RN: Montey Hora Primary Care Physician: Steele Sizer Other Clinician: Referring Physician: Steele Sizer Treating Physician/Extender: Frann Rider in Treatment: 16 Clinic Level of Care Assessment Items TOOL 4 Quantity Score []  - Use when only an EandM is performed on FOLLOW-UP visit 0 ASSESSMENTS - Nursing Assessment /  Reassessment X - Reassessment of Co-morbidities (includes updates in patient status) 1 10 X - Reassessment of Adherence to Treatment Plan 1 5 ASSESSMENTS - Wound and Skin Assessment / Reassessment X - Simple Wound Assessment / Reassessment - one wound 1 5 []  - Complex Wound Assessment / Reassessment - multiple wounds 0 []  - Dermatologic / Skin Assessment (not related to wound area) 0 ASSESSMENTS - Focused Assessment []  - Circumferential Edema Measurements - multi extremities 0 []  - Nutritional Assessment / Counseling / Intervention 0 X - Lower Extremity Assessment (monofilament, tuning fork, pulses) 1 5 []  - Peripheral Arterial Disease Assessment (using hand held doppler) 0 ASSESSMENTS - Ostomy and/or Continence Assessment and Care []  - Incontinence Assessment and Management 0 []  - Ostomy Care Assessment and Management (repouching, etc.) 0 PROCESS - Coordination of Care X - Simple Patient / Family Education for ongoing care 1 15 []  - Complex (extensive) Patient / Family Education for ongoing care 0 []  - Staff obtains Programmer, systems, Records, Test Results / Process Orders 0 []  - Staff telephones HHA, Nursing Homes / Clarify orders / etc 0 []  - Routine Transfer to another Facility (non-emergent condition) 0 [name], [name] ([id number]) []  - Routine Hospital Admission (non-emergent condition) 0 []  - New Admissions / Biomedical engineer / Ordering NPWT, Apligraf, etc. 0 []  - Emergency Hospital Admission (emergent condition) 0 X - Simple Discharge Coordination 1 10 []  - Complex (extensive) Discharge Coordination 0 PROCESS - Special Needs []  - Pediatric / Minor Patient Management 0 []  - Isolation Patient Management 0 []  - Hearing / Language / Visual special needs 0 []  - Assessment of Community assistance (transportation, D/C planning, etc.) 0 []  - Additional assistance / Altered mentation 0 []  - Support Surface(s) Assessment (bed, cushion, seat, etc.) 0 INTERVENTIONS - Wound Cleansing /  Measurement X - Simple Wound Cleansing - one wound 1 5 []  - Complex Wound Cleansing - multiple wounds 0 X - Wound Imaging (photographs - any number of wounds) 1  5 []  - Wound Tracing (instead of photographs) 0 X - Simple Wound Measurement - one wound 1 5 []  - Complex Wound Measurement - multiple wounds 0 INTERVENTIONS - Wound Dressings X - Small Wound Dressing one or multiple wounds 1 10 []  - Medium Wound Dressing one or multiple wounds 0 []  - Large Wound Dressing one or multiple wounds 0 []  - Application of Medications - topical 0 []  - Application of Medications - injection 0 INTERVENTIONS - Miscellaneous []  - External ear exam 0 [name], [name] ([id number]) []  - Specimen Collection (cultures, biopsies, blood, body fluids, etc.) 0 []  - Specimen(s) / Culture(s) sent or taken to Lab for analysis 0 []  - Patient Transfer (multiple staff / Harrel Lemon Lift / Similar devices) 0 []  - Simple Staple / Suture removal (25 or less) 0 []  - Complex Staple / Suture removal (26 or more) 0 []  - Hypo / Hyperglycemic Management (close monitor of Blood Glucose) 0 []  - Ankle / Brachial Index (ABI) - do not check if billed separately 0 X - Vital Signs 1 5 Has the patient been seen at the hospital within the last three years: Yes Total Score: 80 Level Of Care: New/Established - Level 3 Electronic Signature(s) Signed: 04/14/2015 4:07:59 PM By: Montey Hora Entered By: Montey Hora on 04/14/2015 16:07:58 [name], [name] ([id number]) -------------------------------------------------------------------------------- Encounter Discharge Information Details Patient Name: [name], [name] Date of Service: 04/14/2015 1:45 PM Medical Record Number: [id number] Patient Account Number: [id number] Date of Birth/Sex: 1956-03-06 (59 y.o. Female) Treating RN: Montey Hora Primary Care Physician: Steele Sizer Other Clinician: Referring Physician: Steele Sizer Treating Physician/Extender: Frann Rider in  Treatment: 16 Encounter Discharge Information Items Discharge Pain Level: 0 Discharge Condition: Stable Ambulatory Status: Walker Discharge Destination: Nursing Home Transportation: Private Auto Accompanied By: self Schedule Follow-up Appointment: Yes Medication Reconciliation completed No and provided to Patient/Care Provider: Provided on Clinical Summary of Care: 04/14/2015 Form Type Recipient Paper Patient LD Electronic Signature(s) Signed: 04/14/2015 4:09:37 PM By: Montey Hora Previous Signature: 04/14/2015 2:20:33 PM Version By: Ruthine Dose Entered By: Montey Hora on 04/14/2015 16:09:36 Henthorn, [name] ([id number]) -------------------------------------------------------------------------------- Lower Extremity Assessment Details [name], [name] Date of Service: 04/14/2015 1:45 PM Medical Record Number: [id number] Patient Account Number: [id number] Date of Birth/Sex: 09-24-1955 (59 y.o. Female) Treating RN: Montey Hora Primary Care Physician: Steele Sizer Other Clinician: Referring Physician: Steele Sizer Treating Physician/Extender: Frann Rider in Treatment: 16 Vascular Assessment Pulses: Posterior Tibial Dorsalis Pedis Palpable: [Right:Yes] Extremity colors, hair growth, and conditions: Extremity Color: [Right:Normal] Hair Growth on Extremity: [Right:No] Temperature of Extremity: [Right:Warm] Capillary Refill: [Right:< 3 seconds] Electronic Signature(s) Signed: 04/14/2015 4:21:27 PM By: Montey Hora Entered By: Montey Hora on 04/14/2015 14:07:56 [name], [name] ([id number]) -------------------------------------------------------------------------------- Multi Wound Chart Details Patient Name: QIHKV, Caelan S. Date of Service: 04/14/2015 1:45 PM Medical Record Number: [id number] Patient Account Number: [id number] Date of Birth/Sex: 12-Feb-1956 (59 y.o. Female) Treating RN: Montey Hora Primary Care Physician: Steele Sizer Other Clinician: Referring Physician: Steele Sizer Treating Physician/Extender: Frann Rider in Treatment: 16 Vital Signs Height(in): 65 Pulse(bpm): 91 Weight(lbs): 156 Blood Pressure 151/69 (mmHg): Body Mass Index(BMI): 26 Temperature(F): 98.2 Respiratory Rate 18 (breaths/min): Photos: [1:No Photos] [5:No Photos] [N/A:N/A] Wound Location: [1:Right Back] [5:Right, Medial, Anterior Lower Leg] [N/A:N/A] Wounding Event: [1:Blister] [5:Gradually Appeared] [N/A:N/A] Primary Etiology: [1:Pressure Ulcer] [5:Cellulitis] [N/A:N/A] Comorbid History: [1:Glaucoma, Anemia, Coronary Artery Disease, Hypertension, Type II Diabetes, End Stage Renal Disease, Osteoarthritis, Neuropathy] [5:N/A] [N/A:N/A] Date Acquired: [1:10/19/2013] [5:03/30/2015] [N/A:N/A] Weeks of Treatment: [  1:16] [5:1] [N/A:N/A] Wound Status: [1:Open] [5:Healed - Epithelialized] [N/A:N/A] Measurements L x W x D 2.6x3.9x0.1 [5:0x0x0] [N/A:N/A] (cm) Area (cm) : [1:7.964] [5:0] [N/A:N/A] Volume (cm) : [1:0.796] [5:0] [N/A:N/A] % Reduction in Area: [1:79.30%] [5:100.00%] [N/A:N/A] % Reduction in Volume: 79.30% [5:100.00%] [N/A:N/A] Classification: [1:Category/Stage II] [5:Partial Thickness] [N/A:N/A] Exudate Amount: [1:Medium] [5:N/A] [N/A:N/A] Exudate Type: [1:Sanguinous] [5:N/A] [N/A:N/A] Exudate Color: [1:red] [5:N/A] [N/A:N/A] Wound Margin: [1:Distinct, outline attached N/A] [N/A:N/A] Granulation Amount: [1:Large (67-100%)] [5:N/A] [N/A:N/A] Granulation Quality: [1:Red, Friable] [5:N/A] [N/A:N/A] Necrotic Amount: [1:None Present (0%)] [5:N/A] [N/A:N/A] Exposed Structures: [1:Fascia: No Fat: No] [5:N/A] [N/A:N/A] Tendon: No Muscle: No Joint: No Bone: No Limited to Skin Breakdown Epithelialization: Medium (34-66%) N/A N/A Periwound Skin Texture: Edema: No No Abnormalities Noted N/A Excoriation: No Induration: No Callus: No Crepitus: No Fluctuance: No Friable: No Rash: No Scarring:  No Periwound Skin Moist: Yes No Abnormalities Noted N/A Moisture: Maceration: No Dry/Scaly: No Periwound Skin Color: Atrophie Blanche: No No Abnormalities Noted N/A Cyanosis: No Ecchymosis: No Erythema: No Hemosiderin Staining: No Mottled: No Pallor: No Rubor: No Temperature: No Abnormality N/A N/A Tenderness on No No N/A Palpation: Wound Preparation: Ulcer Cleansing: N/A N/A Rinsed/Irrigated with Saline Topical Anesthetic Applied: None Treatment Notes Electronic Signature(s) Signed: 04/14/2015 4:21:27 PM By: Montey Hora Entered By: Montey Hora on 04/14/2015 14:10:01 [name], [name] ([id number]) -------------------------------------------------------------------------------- [place] Details [name]. Date of Service: 04/14/2015 1:45 PM Medical Record Number: [id number] Patient Account Number: [id number] Date of Birth/Sex: Jul 05, 1956 (59 y.o. Female) Treating RN: Montey Hora Primary Care Physician: Steele Sizer Other Clinician: Referring Physician: Steele Sizer Treating Physician/Extender: Frann Rider in Treatment: 89 Active Inactive Abuse / Safety / Falls / Self Care Management Nursing Diagnoses: Potential for falls Goals: Patient will remain injury free Date Initiated: 12/23/2014 Goal Status: Active Patient/caregiver will verbalize understanding of skin care regimen Date Initiated: 12/23/2014 Goal Status: Active Patient/caregiver will verbalize/demonstrate measures taken to prevent injury and/or falls Date Initiated: 12/23/2014 Goal Status: Active Patient/caregiver will verbalize/demonstrate understanding of what to do in case of emergency Date Initiated: 12/23/2014 Goal Status: Active Interventions: Assess fall risk on admission and as needed Assess: immobility, friction, shearing, incontinence upon admission and as needed Assess impairment of mobility on admission and as needed per policy Assess self  care needs on admission and as needed Provide education on fall prevention Notes: Wound/Skin Impairment Nursing Diagnoses: Impaired tissue integrity Knowledge deficit related to ulceration/compromised skin integrity Goals: Patient/caregiver will verbalize understanding of skin care regimen [name], [name] ([id number]) Date Initiated: 12/23/2014 Goal Status: Active Ulcer/skin breakdown will heal within 14 weeks Date Initiated: 12/23/2014 Goal Status: Active Interventions: Assess patient/caregiver ability to obtain necessary supplies Assess patient/caregiver ability to perform ulcer/skin care regimen upon admission and as needed Assess ulceration(s) every visit Provide education on ulcer and skin care Treatment Activities: Skin care regimen initiated : 04/14/2015 Topical wound management initiated : 04/14/2015 Notes: Electronic Signature(s) Signed: 04/14/2015 4:21:27 PM By: Montey Hora Entered By: Montey Hora on 04/14/2015 14:09:53 [name], [name] ([id number]) -------------------------------------------------------------------------------- Patient/Caregiver Education Details [name]. Date of Service: 04/14/2015 1:45 PM Medical Record Number: [id number] Patient Account Number: [id number] Date of Birth/Gender: Mar 11, 1956 (59 y.o. Female) Treating RN: Montey Hora Primary Care Physician: Steele Sizer Other Clinician: Referring Physician: Steele Sizer Treating Physician/Extender: Frann Rider in Treatment: 16 Education Assessment Education Provided To: Patient Education Topics Provided Wound/Skin Impairment: Handouts: Other: wound care as ordered Methods: Explain/Verbal Responses: State content correctly Electronic Signature(s) Signed: 04/14/2015 4:11:40 PM By:  Dorthy, Di Kindle Entered By: Montey Hora on 04/14/2015 16:11:40 [name], [name] ([id number]) -------------------------------------------------------------------------------- Wound  Assessment Details [name], [name] Date of Service: 04/14/2015 1:45 PM Medical Record Number: [id number] Patient Account Number: [id number] Date of Birth/Sex: 15-Jul-1956 (59 y.o. Female) Treating RN: Montey Hora Primary Care Physician: Steele Sizer Other Clinician: Referring Physician: Steele Sizer Treating Physician/Extender: Frann Rider in Treatment: 16 Wound Status Wound Number: 1 Primary Pressure Ulcer Etiology: Wound Location: Right Back Wound Open Wounding Event: Blister Status: Date Acquired: 10/19/2013 Comorbid Glaucoma, Anemia, Coronary Artery Weeks Of Treatment: 16 History: Disease, Hypertension, Type II Clustered Wound: No Diabetes, End Stage Renal Disease, Osteoarthritis, Neuropathy Photos Photo Uploaded By: Montey Hora on 04/14/2015 16:19:37 Wound Measurements Length: (cm) 2.6 Width: (cm) 3.9 Depth: (cm) 0.1 Area: (cm) 7.964 Volume: (cm) 0.796 % Reduction in Area: 79.3% % Reduction in Volume: 79.3% Epithelialization: Medium (34-66%) Tunneling: No Undermining: No Wound Description Classification: Category/Stage II Wound Margin: Distinct, outline attached Exudate Amount: Medium Exudate Type: Sanguinous Exudate Color: red Foul Odor After Cleansing: No Wound Bed Granulation Amount: Large (67-100%) Exposed Structure Granulation Quality: Red, Friable Fascia Exposed: No Necrotic Amount: None Present (0%) Fat Layer Exposed: No [name], [name] ([id number]) Tendon Exposed: No Muscle Exposed: No Joint Exposed: No Bone Exposed: No Limited to Skin Breakdown Periwound Skin Texture Texture Color No Abnormalities Noted: No No Abnormalities Noted: No Callus: No Atrophie Blanche: No Crepitus: No Cyanosis: No Excoriation: No Ecchymosis: No Fluctuance: No Erythema: No Friable: No Hemosiderin Staining: No Induration: No Mottled: No Localized Edema: No Pallor: No Rash: No Rubor: No Scarring: No Temperature /  Pain Moisture Temperature: No Abnormality No Abnormalities Noted: No Dry / Scaly: No Maceration: No Moist: Yes Wound Preparation Ulcer Cleansing: Rinsed/Irrigated with Saline Topical Anesthetic Applied: None Treatment Notes Wound #1 (Right Back) 1. Cleansed with: Clean wound with Normal Saline 3. Peri-wound Care: Skin Prep 4. Dressing Applied: Dry Gauze Mepitel 5. Secondary Dressing Applied Bordered Foam Dressing Electronic Signature(s) Signed: 04/14/2015 4:21:27 PM By: Montey Hora Entered By: Montey Hora on 04/14/2015 14:07:34 Amsler, [name] ([id number]) -------------------------------------------------------------------------------- Wound Assessment Details Patient Name: Schuneman, Keilah S. Date of Service: 04/14/2015 1:45 PM Medical Record Number: [id number] Patient Account Number: [id number] Date of Birth/Sex: 1956-01-16 (59 y.o. Female) Treating RN: Montey Hora Primary Care Physician: Steele Sizer Other Clinician: Referring Physician: Steele Sizer Treating Physician/Extender: Frann Rider in Treatment: 16 Wound Status Wound Number: 5 Primary Etiology: Cellulitis Wound Location: Right, Medial, Anterior Lower Wound Status: Healed - Epithelialized Leg Wounding Event: Gradually Appeared Date Acquired: 03/30/2015 Weeks Of Treatment: 1 Clustered Wound: No Photos Photo Uploaded By: Montey Hora on 04/14/2015 16:19:38 Wound Measurements Length: (cm) 0 % Reduction Width: (cm) 0 % Reduction Depth: (cm) 0 Area: (cm) 0 Volume: (cm) 0 in Area: 100% in Volume: 100% Wound Description Classification: Partial Thickness Periwound Skin Texture Texture Color No Abnormalities Noted: No No Abnormalities Noted: No Moisture No Abnormalities Noted: No Electronic Signature(s) Signed: 04/14/2015 4:21:27 PM By: Posey Pronto ([id number]) Entered By: Montey Hora on 04/14/2015 14:09:45 Tedesco, [name]  ([id number]) -------------------------------------------------------------------------------- Vitals Details Patient Name: PPJKD, Latrell S. Date of Service: 04/14/2015 1:45 PM Medical Record Number: [id number] Patient Account Number: [id number] Date of Birth/Sex: September 13, 1955 (59 y.o. Female) Treating RN: Montey Hora Primary Care Physician: Steele Sizer Other Clinician: Referring Physician: Steele Sizer Treating Physician/Extender: Frann Rider in Treatment: 16 Vital Signs Time Taken: 14:02 Temperature (F): 98.2 Height (in): 65 Pulse (bpm): 91 Weight (lbs): 156 Respiratory Rate (breaths/min):  18 Body Mass Index (BMI): 26 Blood Pressure (mmHg): 151/69 Reference Range: 80 - 120 mg / dl Electronic Signature(s) Signed: 04/14/2015 4:21:27 PM By: Montey Hora Entered By: Montey Hora on 04/14/2015 14:02:18

## 2015-04-21 ENCOUNTER — Ambulatory Visit: Payer: Medicare Other | Admitting: Surgery

## 2015-04-23 ENCOUNTER — Encounter: Payer: Self-pay | Admitting: General Surgery

## 2015-04-23 ENCOUNTER — Encounter: Payer: Medicare Other | Attending: General Surgery | Admitting: General Surgery

## 2015-04-23 DIAGNOSIS — Z992 Dependence on renal dialysis: Secondary | ICD-10-CM | POA: Diagnosis not present

## 2015-04-23 DIAGNOSIS — L89112 Pressure ulcer of right upper back, stage 2: Secondary | ICD-10-CM | POA: Diagnosis not present

## 2015-04-23 DIAGNOSIS — X58XXXD Exposure to other specified factors, subsequent encounter: Secondary | ICD-10-CM | POA: Diagnosis not present

## 2015-04-23 DIAGNOSIS — S80821D Blister (nonthermal), right lower leg, subsequent encounter: Secondary | ICD-10-CM | POA: Insufficient documentation

## 2015-04-23 DIAGNOSIS — I2510 Atherosclerotic heart disease of native coronary artery without angina pectoris: Secondary | ICD-10-CM | POA: Insufficient documentation

## 2015-04-23 DIAGNOSIS — N186 End stage renal disease: Secondary | ICD-10-CM | POA: Diagnosis not present

## 2015-04-23 DIAGNOSIS — L98421 Non-pressure chronic ulcer of back limited to breakdown of skin: Secondary | ICD-10-CM | POA: Diagnosis not present

## 2015-04-23 DIAGNOSIS — E11622 Type 2 diabetes mellitus with other skin ulcer: Secondary | ICD-10-CM | POA: Diagnosis not present

## 2015-04-23 DIAGNOSIS — I120 Hypertensive chronic kidney disease with stage 5 chronic kidney disease or end stage renal disease: Secondary | ICD-10-CM | POA: Insufficient documentation

## 2015-04-23 NOTE — Progress Notes (Signed)
seeiheal 

## 2015-04-27 NOTE — Progress Notes (Signed)
[name], [name] ([id number]) Visit Report for 04/23/2015 Arrival Information Details [name]. Date of Service: 04/23/2015 1:00 PM Medical Record Number: [id number] Patient Account Number: [id number] Date of Birth/Sex: Sep 05, 1955 (59 y.o. Female) Treating RN: Cornell Barman Primary Care Physician: Steele Sizer Other Clinician: Referring Physician: Steele Sizer Treating Physician/Extender: Frann Rider in Treatment: 34 Visit Information History Since Last Visit Added or deleted any medications: No Patient Arrived: [name] Any new allergies or adverse reactions: No Arrival Time: 13:07 Had a fall or experienced change in No Accompanied By: self activities of daily living that may affect Transfer Assistance: None risk of falls: Patient Identification Verified: Yes Signs or symptoms of abuse/neglect since last No Secondary Verification Process Yes visito Completed: Hospitalized since last visit: No Patient Requires Transmission- No Has Dressing in Place as Prescribed: Yes Based Precautions: Pain Present Now: No Patient Has Alerts: Yes Patient Alerts: Patient on Blood Thinner ABI R: 1.28 Electronic Signature([name]) Signed: 04/27/2015 9:25:31 AM By: Gretta Cool, RN, [name], Kim RN, [name] Entered By: Gretta Cool, RN, [name], Kim on 04/23/2015 13:07:58 [name] ([id number]) -------------------------------------------------------------------------------- Encounter Discharge Information Details [name]. Date of Service: 04/23/2015 1:00 PM Medical Record Number: [id number] Patient Account Number: [id number] Date of Birth/Sex: [birth year] (59 y.o. Female) Treating RN: Cornell Barman Primary Care Physician: Steele Sizer Other Clinician: Referring Physician: Steele Sizer Treating Physician/Extender: Frann Rider in Treatment: 17 Encounter Discharge Information Items Discharge Pain Level: 0 Discharge Condition: Stable Ambulatory Status:  Walker Discharge Destination: Home Transportation: Private Auto Accompanied By: self Schedule Follow-up Appointment: Yes Medication Reconciliation completed Yes and provided to Patient/Care [name]: Provided on Clinical Summary of Care: 04/23/2015 Form Type Recipient Paper Patient LD Electronic Signature([name]) Signed: 04/23/2015 5:11:41 PM By: Judene Companion MD Previous Signature: 04/23/2015 1:25:54 PM Version By: Ruthine Dose Entered By: Judene Companion on 04/23/2015 16:29:36 [name] ([id number]) -------------------------------------------------------------------------------- Lower Extremity Assessment Details [name], [name]. Date of Service: 04/23/2015 1:00 PM Medical Record Number: [id number] Patient Account Number: [id number] Date of Birth/Sex: [birth year] (59 y.o. Female) Treating RN: Cornell Barman Primary Care Physician: Steele Sizer Other Clinician: Referring Physician: Steele Sizer Treating Physician/Extender: Frann Rider in Treatment: 17 Vascular Assessment Pulses: Posterior Tibial Dorsalis Pedis Palpable: [Right:Yes] Extremity colors, hair growth, and conditions: Extremity Color: [Right:Normal] Hair Growth on Extremity: [Right:Yes] Temperature of Extremity: [Right:Warm] Capillary Refill: [Right:< 3 seconds] Toe Nail Assessment Left: Right: Thick: No Discolored: No Deformed: No Improper Length and Hygiene: No Electronic Signature([name]) Signed: 04/27/2015 9:25:31 AM By: Gretta Cool, RN, [name], Kim RN, [name] Entered By: Gretta Cool, RN, [name], Kim on 04/23/2015 13:27:23 [name] ([id number]) -------------------------------------------------------------------------------- Multi Wound Chart Details [name]. Date of Service: 04/23/2015 1:00 PM Medical Record Number: [id number] Patient Account Number: [id number] Date of Birth/Sex: Mar 27, 1956 (59 y.o. 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] [N/A:N/A] Comorbid History: [1:Glaucoma, Anemia, Coronary Artery Disease, Coronary Artery Disease, Hypertension, Type II Diabetes, End Stage Renal Disease, Osteoarthritis, Neuropathy Osteoarthritis, Neuropathy] [4R:Glaucoma, Anemia, Hypertension, Type II Diabetes,  End Stage Renal Disease,] [N/A:N/A] Date Acquired: [1:10/19/2013] [4R:03/07/2015] [N/A:N/A] Weeks of Treatment: [1:17] [4R:6] [N/A:N/A] Wound  Status: [1:Open] [4R:Open] [N/A:N/A] Wound Recurrence: [1:No] [4R:Yes] [N/A:N/A] Measurements L x W x D 2.5x2.2x0.1 [4R:1.1x2.5x0.1] [N/A:N/A] (cm) Area (cm) : [1:4.32] [4R:2.16] [N/A:N/A] Volume (cm) : [1:0.432] [2W:5.809] [N/A:N/A] % Reduction in Area: [1:88.80%] [4R:92.60%] [N/A:N/A] % Reduction in Volume: 88.80% [4R:92.60%] [N/A:N/A] Classification: [1:Category/Stage II] [4R:Partial Thickness] [N/A:N/A] HBO Classification: [1:N/A] [4R:Grade 0] [N/A:N/A] Exudate Amount: [1:Medium] [4R:None Present] [N/A:N/A] Exudate Type: [1:Sanguinous] [4R:N/A] [N/A:N/A] Exudate Color: [1:red] [4R:N/A] [N/A:N/A] Wound Margin: [1:Distinct, outline attached Indistinct, nonvisible] [N/A:N/A] Granulation Amount: [1:Large (67-100%)] [4R:None Present (0%)] [N/A:N/A] Granulation Quality: [1:Red, Friable] [4R:N/A] [N/A:N/A] Necrotic Amount: [1:None Present (0%)] [4R:N/A] [N/A:N/A] Exposed Structures: [N/A:N/A] Fascia: No Fascia: No Fat: No Fat: No Tendon: No Tendon: No Muscle: No Muscle:  No Joint: No Joint: No Bone: No Bone: No Limited to Skin Limited to Skin Breakdown Breakdown Epithelialization: Medium (34-66%) N/A N/A Debridement: N/A Open Wound/Selective N/A ([id number]) - Selective Time-Out Taken: N/A Yes N/A Tissue Debrided: N/A Exudates, Skin N/A Level: N/A Non-Viable Tissue N/A Debridement Area (sq N/A 2.75 N/A cm): Instrument: N/A Scissors N/A Bleeding: N/A None N/A Procedural Pain: N/A 0 N/A Post Procedural Pain: N/A 0 N/A Debridement Treatment N/A Procedure was tolerated N/A Response: well Post Debridement N/A 1.1x2.5x0.1 N/A Measurements L x W x D (cm) Post Debridement N/A 0.216 N/A Volume: (cm) Periwound Skin Texture: Edema: No Edema: No N/A Excoriation: No Excoriation: No Induration: No Induration: No Callus: No Callus: No Crepitus: No Crepitus: No Fluctuance: No Fluctuance: No Friable: No Friable: No Rash: No Rash: No Scarring: No Scarring: No Periwound Skin Moist: Yes Maceration: No N/A Moisture: Maceration: No Moist: No Dry/Scaly: No Dry/Scaly: No Periwound Skin Color: Atrophie Blanche: No Atrophie Blanche: No N/A Cyanosis: No Cyanosis: No Ecchymosis: No Ecchymosis: No Erythema: No Erythema: No Hemosiderin Staining: No Hemosiderin Staining: No Mottled: No Mottled: No Pallor: No Pallor: No Rubor: No Rubor: No Temperature: No Abnormality N/A N/A Tenderness on No No N/A Palpation: Better, [name] ([id number]) Wound Preparation: Ulcer Cleansing: Ulcer Cleansing: N/A Rinsed/Irrigated with Rinsed/Irrigated with Saline Saline Topical Anesthetic Topical Anesthetic Applied: None Applied: None Procedures Performed: N/A Debridement N/A Treatment Notes Electronic Signature([name]) Signed: 04/27/2015 9:25:31 AM By: Gretta Cool, RN, [name], Kim RN, [name] Entered By: Gretta Cool, RN, [name], Kim on 04/23/2015 13:27:37 [name], [name]  ([id number]) -------------------------------------------------------------------------------- Multi-Disciplinary Care Plan Details Patient Name: PAISLEIGH, MARONEY. Date of Service: 04/23/2015 1:00 PM Medical Record Number: [id number] Patient Account Number: [id number] Date of Birth/Sex: May 31, 1956 (59 y.o. Female) Treating RN: Cornell Barman Primary Care Physician: Steele Sizer Other Clinician: Referring Physician: Steele Sizer Treating Physician/Extender: Frann Rider in Treatment: 70 Active Inactive Abuse / Safety / Falls / Self Care Management Nursing Diagnoses: Potential for falls Goals: Patient will remain injury free Date Initiated: 12/23/2014 Goal Status: Active Patient/caregiver will verbalize understanding of skin care regimen Date Initiated: 12/23/2014 Goal Status: Active Patient/caregiver will verbalize/demonstrate measures taken to prevent injury and/or falls Date Initiated: 12/23/2014 Goal Status: Active Patient/caregiver will verbalize/demonstrate understanding of what to do in case of emergency Date Initiated: 12/23/2014 Goal Status: Active Interventions: Assess fall risk on admission and as needed Assess: immobility, friction, shearing, incontinence upon admission and as needed Assess impairment of mobility on admission and as needed per policy Assess self care needs on admission and as needed Provide education on fall prevention Notes: Wound/Skin Impairment Nursing Diagnoses: Impaired tissue integrity Knowledge deficit related to ulceration/compromised skin integrity Goals: Patient/caregiver will verbalize understanding of skin care regimen LORENZO, PEREYRA ([id number]) Date Initiated: 12/23/2014 Goal Status: Active Ulcer/skin breakdown will heal within  14 weeks Date Initiated: 12/23/2014 Goal Status: Active Interventions: Assess patient/caregiver ability to obtain necessary supplies Assess patient/caregiver ability to perform ulcer/skin care regimen upon  admission and as needed Assess ulceration([name]) every visit Provide education on ulcer and skin care Treatment Activities: Skin care regimen initiated : 04/23/2015 Topical wound management initiated : 04/23/2015 Notes: Electronic Signature([name]) Signed: 04/27/2015 9:25:31 AM By: Gretta Cool, RN, [name], Kim RN, [name] Entered By: Gretta Cool, RN, [name], Kim on 04/23/2015 13:27:30 Brandenberger, [name] ([id number]) -------------------------------------------------------------------------------- Pain Assessment Details [name]. Date of Service: 04/23/2015 1:00 PM Medical Record Number: [id number] Patient Account Number: [id number] Date of Birth/Sex: Dec 13, 1955 (59 y.o. Female) Treating RN: Cornell Barman Primary Care Physician: Steele Sizer Other Clinician: Referring Physician: Steele Sizer Treating Physician/Extender: Frann Rider in Treatment: 17 Active Problems Location of Pain Severity and Description of Pain Patient Has Paino No Site Locations Pain Management and Medication Current Pain Management: Electronic Signature([name]) Signed: 04/27/2015 9:25:31 AM By: Gretta Cool, RN, [name], Kim RN, [name] Entered By: Gretta Cool, RN, [name], Kim on 04/23/2015 13:08:11 Markel, [name] ([id number]) -------------------------------------------------------------------------------- Patient/Caregiver Education Details [name]. Date of Service: 04/23/2015 1:00 PM Medical Record Number: [id number] Patient Account Number: [id number] Date of Birth/Gender: 08/27/1955 (59 y.o. Female) Treating RN: Cornell Barman Primary Care Physician: Steele Sizer Other Clinician: Referring Physician: Steele Sizer Treating Physician/Extender: Frann Rider in Treatment: 45 Education Assessment Education Provided To: Patient Education Topics Provided Wound/Skin Impairment: Handouts: Caring for Your Ulcer, Other: wound care as prescribed Methods: Demonstration, Explain/Verbal Responses: State content  correctly Electronic Signature([name]) Signed: 04/23/2015 5:11:41 PM By: Judene Companion MD Entered By: Judene Companion on 04/23/2015 16:29:44 [name], [name] ([id number]) -------------------------------------------------------------------------------- Wound Assessment Details [name], [name]. Date of Service: 04/23/2015 1:00 PM Medical Record Number: [id number] Patient Account Number: [id number] Date of Birth/Sex: 1956/04/12 (59 y.o. Female) Treating RN: Cornell Barman Primary Care Physician: Steele Sizer Other Clinician: Referring Physician: Steele Sizer Treating Physician/Extender: Frann Rider in Treatment: 17 Wound Status Wound Number: 1 Primary Pressure Ulcer Etiology: Wound Location: Right Back Wound Open Wounding Event: Blister Status: Date Acquired: 10/19/2013 Comorbid Glaucoma, Anemia, Coronary Artery Weeks Of Treatment: 17 History: Disease, Hypertension, Type II Clustered Wound: No Diabetes, End Stage Renal Disease, Osteoarthritis, Neuropathy Photos Photo Uploaded By: Gretta Cool, RN, [name], Kim on 04/23/2015 14:59:48 Wound Measurements Length: (cm) 2.5 Width: (cm) 2.2 Depth: (cm) 0.1 Area: (cm) 4.32 Volume: (cm) 0.432 % Reduction in Area: 88.8% % Reduction in Volume: 88.8% Epithelialization: Medium (34-66%) Wound Description Classification: Category/Stage II Wound Margin: Distinct, outline attached Exudate Amount: Medium Exudate Type: Sanguinous Exudate Color: red Foul Odor After Cleansing: No Wound Bed Granulation Amount: Large (67-100%) Exposed Structure Granulation Quality: Red, Friable Fascia Exposed: No Necrotic Amount: None Present (0%) Fat Layer Exposed: No [name], [name]. ([id number]) Tendon Exposed: No Muscle Exposed: No Joint Exposed: No Bone Exposed: No Limited to Skin Breakdown Periwound Skin Texture Texture Color No Abnormalities Noted: No No Abnormalities Noted: No Callus: No Atrophie Blanche: No Crepitus: No Cyanosis:  No Excoriation: No Ecchymosis: No Fluctuance: No Erythema: No Friable: No Hemosiderin Staining: No Induration: No Mottled: No Localized Edema: No Pallor: No Rash: No Rubor: No Scarring: No Temperature / Pain Moisture Temperature: No Abnormality No Abnormalities Noted: No Dry / Scaly: No Maceration: No Moist: Yes Wound Preparation Ulcer Cleansing: Rinsed/Irrigated with Saline Topical Anesthetic Applied: None Treatment Notes Wound #1 (Right Back) 1. Cleansed with: Clean wound with Normal Saline 2. Anesthetic Topical Lidocaine 4% cream to wound bed prior  to debridement 4. Dressing Applied: Prisma Ag 5. Secondary Dressing Applied Bordered Foam Dressing Electronic Signature([name]) Signed: 04/27/2015 9:25:31 AM By: Gretta Cool, RN, [name], Kim RN, [name] Entered By: Gretta Cool, RN, [name], Kim on 04/23/2015 13:15:51 [name], [name] ([id number]) -------------------------------------------------------------------------------- Wound Assessment Details [name], SOLTIS. Date of Service: 04/23/2015 1:00 PM Medical Record Number: [id number] Patient Account Number: [id number] Date of Birth/Sex: 04/27/1956 (59 y.o. Female) Treating RN: Cornell Barman Primary Care Physician: Steele Sizer Other Clinician: Referring Physician: Steele Sizer Treating Physician/Extender: Frann Rider in Treatment: 17 Wound Status Wound Number: 4R Primary Auto-immune Etiology: Wound Location: Right Lower Leg - Anterior Wound Open Wounding Event: Blister Status: Date Acquired: 03/07/2015 Comorbid Glaucoma, Anemia, Coronary Artery Weeks Of Treatment: 6 History: Disease, Hypertension, Type II Clustered Wound: No Diabetes, End Stage Renal Disease, Osteoarthritis, Neuropathy Photos Photo Uploaded By: Gretta Cool, RN, [name], Kim on 04/23/2015 14:59:49 Wound Measurements Length: (cm) 1.1 Width: (cm) 2.5 Depth: (cm) 0.1 Area: (cm) 2.16 Volume: (cm) 0.216 % Reduction in Area: 92.6% % Reduction in Volume:  92.6% Wound Description Classification: Partial Thickness Diabetic Severity Earleen Newport): Grade 0 Wound Margin: Indistinct, nonvisible Exudate Amount: None Present Wound Bed Granulation Amount: None Present (0%) Exposed Structure Fascia Exposed: No Fat Layer Exposed: No Tendon Exposed: No [name], [name]. ([id number]) Muscle Exposed: No Joint Exposed: No Bone Exposed: No Limited to Skin Breakdown Periwound Skin Texture Texture Color No Abnormalities Noted: No No Abnormalities Noted: No Callus: No Atrophie Blanche: No Crepitus: No Cyanosis: No Excoriation: No Ecchymosis: No Fluctuance: No Erythema: No Friable: No Hemosiderin Staining: No Induration: No Mottled: No Localized Edema: No Pallor: No Rash: No Rubor: No Scarring: No Moisture No Abnormalities Noted: No Dry / Scaly: No Maceration: No Moist: No Wound Preparation Ulcer Cleansing: Rinsed/Irrigated with Saline Topical Anesthetic Applied: None Treatment Notes Wound #4R (Right, Anterior Lower Leg) 1. Cleansed with: Clean wound with Normal Saline 2. Anesthetic Topical Lidocaine 4% cream to wound bed prior to debridement 4. Dressing Applied: Prisma Ag 5. Secondary Dressing Applied Bordered Foam Dressing Electronic Signature([name]) Signed: 04/27/2015 9:25:31 AM By: Gretta Cool, RN, [name], Kim RN, [name] Entered By: Gretta Cool, RN, [name], Kim on 04/23/2015 13:15:29 Defreitas, [name] ([id number]) -------------------------------------------------------------------------------- [place] Details [name]. Date of Service: 04/23/2015 1:00 PM Medical Record Number: [id number] Patient Account Number: [id number] Date of Birth/Sex: 01-17-1956 (59 y.o. Female) Treating RN: Cornell Barman Primary Care Physician: Steele Sizer Other Clinician: Referring Physician: Steele Sizer Treating Physician/Extender: Frann Rider in Treatment: 17 Vital Signs Time Taken: 13:08 Temperature (F): 98.2 Height (in): 65 Pulse  (bpm): 93 Weight (lbs): 156 Respiratory Rate (breaths/min): 18 Body Mass Index (BMI): 26 Blood Pressure (mmHg): 169/74 Reference Range: 80 - 120 mg / dl Electronic Signature([name]) Signed: 04/27/2015 9:25:31 AM By: Gretta Cool, RN, [name], Kim RN, [name] Entered By: Gretta Cool, RN, [name], Kim on 04/23/2015 13:08:36

## 2015-04-28 NOTE — Progress Notes (Addendum)
NEIL, ERRICKSON ([id number]) Visit Report for 04/23/2015 Chief Complaint Document Details [name], [name]. Date of Service: 04/23/2015 1:00 PM Medical Record Number: [id number] Patient Account Number: [id number] Date of Birth/Sex: 1956-06-12 (59 y.o. Female) Treating RN: Cornell Barman Primary Care Physician: Steele Sizer Other Clinician: Referring Physician: Steele Sizer Treating Physician/Extender: Benjaman Pott in Treatment: 17 Information Obtained from: Patient Chief Complaint Patient presents to the wound care center for a consult due non healing wound. She has an open wound on her right upper back which she's had for about a year and she recently noticed a blister on her right lower extremity about 2 weeks ago. Electronic Signature(s) Signed: 04/23/2015 5:11:41 PM By: Judene Companion MD Entered By: Judene Companion on 04/23/2015 16:26:36 Grade, [name] ([id number]) -------------------------------------------------------------------------------- Debridement Details [name]. Date of Service: 04/23/2015 1:00 PM Medical Record Number: [id number] Patient Account Number: [id number] Date of Birth/Sex: 05-19-1956 (59 y.o. Female) Treating RN: Cornell Barman Primary Care Physician: Steele Sizer Other Clinician: Referring Physician: Steele Sizer Treating Physician/Extender: Frann Rider in Treatment: 17 Debridement Performed for Wound #4R Right,Anterior Lower Leg Assessment: Performed By: Physician [name], MD Debridement: Open Wound/Selective Debridement Selective Description: Pre-procedure Yes Verification/Time Out Taken: Start Time: 13:20 Level: Non-Viable Tissue Total Area Debrided (L x 1.1 (cm) x 2.5 (cm) = 2.75 (cm) W): Tissue and other Non-Viable, Exudate, Skin material debrided: Instrument: Scissors Bleeding: None End Time: 13:22 Procedural Pain: 0 Post Procedural Pain: 0 Response to Treatment: Procedure was  tolerated well Post Debridement Measurements of Total Wound Length: (cm) 1.1 Width: (cm) 2.5 Depth: (cm) 0.1 Volume: (cm) 0.216 Post Procedure Diagnosis Same as Pre-procedure Electronic Signature(s) Signed: 04/27/2015 9:25:31 AM By: Gretta Cool, RN, [name], Kim RN, [name] Signed: 04/28/2015 8:06:26 AM By: [name], [name] Entered By: Gretta Cool RN, [name], Kim on 04/23/2015 13:26:52 Mast, [name] ([id number]) -------------------------------------------------------------------------------- HPI Details [name], [name]. Date of Service: 04/23/2015 1:00 PM Medical Record Number: [id number] Patient Account Number: [id number] Date of Birth/Sex: 05/24/1956 (59 y.o. Female) Treating RN: Cornell Barman Primary Care Physician: Steele Sizer Other Clinician: Referring Physician: Steele Sizer Treating Physician/Extender: Frann Rider in Treatment: 17 History of Present Illness Location: right upper back and right lower extremity wounds Quality: Patient reports No Pain. Severity: Patient states wound (s) are getting better. Duration: Patient has had the wound for > 3 months prior to seeking treatment at the wound center Timing: she thought it first occurred when she was using a heating pad about a year ago after back surgery. Context: The wound appeared gradually over time Modifying Factors: Patient is currently on renal dialysis and receives treatments 3 times weekly Associated Signs and Symptoms: Patient reports having: surgery scheduled for this week for a AV fistula left arm. HPI Description: 59 year old patient who is known to be a diabetic and has end-stage renal disease has had several comorbidities including coronary artery disease, hypertension, hyperlipidemia, pancreatitis, anemia, previous history of hysterectomy, cholecystectomy, left-sided salivary gland excision, bilateral cataract surgery,Peritoneal dialysis catheter, hemodialysis catheter. the area on the back has  also been caused by instant pressure she used to sleep on a recliner all day and has significant kyphoscoliosis. As far as the wound on her right lower extremity she's not sure how this blister occurred but she thought it has been there for about 2 weeks. No recent blood investigations available and no recent hemoglobin A1c. 12/30/2014 -- she is an assisted living facility but I believe the nurses that have not  followed instructions as she had some cream applied on her back and there was a different dressing. Last week she's had a AV fistula placed on her left forearm. 01/13/2015 -- she has had some localized infection at the port site and she's been on doxycycline for this. 02/05/2015 - he has developed a small blister on her right lower extremity. 02/10/2015 -- she has developed another small blister on her right anterior chest wall in the area where she's had tape for her dialysis access. this may just be injury caused by a tape burn. 02/18/2015 -- no new blisters and she had a dermatology opinion and they have taken a biopsy of her skin. She also had a left brachial AV fistula placed this week. 03/03/2015 -- though we do not have the pathology report yet the patient says she has been put on prednisone because this skin disease is possibly to do with her immune system and her dermatologist is recommended this. 03/10/2015 -- she has developed a new blister which is quite large on her right lower extremity on the shin. Electronic Signature(s) Signed: 04/23/2015 5:11:41 PM By: Judene Companion MD Veracruz, [name] ([id number]) Signed: 04/28/2015 8:06:26 AM By: [name], [name] Entered By: Judene Companion on 04/23/2015 16:27:00 Knittle, [name] ([id number]) -------------------------------------------------------------------------------- Physical Exam Details [name], [name]. Date of Service: 04/23/2015 1:00 PM Medical Record Number: [id number] Patient Account Number: [id number] Date  of Birth/Sex: [birth year] (59 y.o. Female) Treating RN: Cornell Barman Primary Care Physician: Steele Sizer Other Clinician: Referring Physician: Steele Sizer Treating Physician/Extender: Frann Rider in Treatment: 17 Electronic Signature(s) Signed: 04/23/2015 5:11:41 PM By: Judene Companion MD Signed: 04/28/2015 8:06:26 AM By: [name], [name] Entered By: Judene Companion on 04/23/2015 16:27:08 Parrow, [name] ([id number]) -------------------------------------------------------------------------------- Physician Orders Details [name], [name]. Date of Service: 04/23/2015 1:00 PM Medical Record Number: [id number] Patient Account Number: [id number] Date of Birth/Sex: 05/26/1956 (59 y.o. Female) Treating RN: Cornell Barman Primary Care Physician: Steele Sizer Other Clinician: Referring Physician: Steele Sizer Treating Physician/Extender: Benjaman Pott in Treatment: 23 Verbal / Phone Orders: Yes Clinician: Cornell Barman Read Back and Verified: Yes Diagnosis Coding Wound Cleansing Wound #1 Right Back o Clean wound with Normal Saline. Wound #4R Right,Anterior Lower Leg o Clean wound with Normal Saline. Primary Wound Dressing Wound #1 Right Back o Prisma Ag - Silver Collagen Wound #4R Right,Anterior Lower Leg o Prisma Ag - Silver Collagen Secondary Dressing Wound #1 Right Back o Boardered Foam Dressing Wound #4R Right,Anterior Lower Leg o Boardered Foam Dressing Dressing Change Frequency Wound #1 Right Back o Change dressing every other day. Wound #4R Right,Anterior Lower Leg o Change dressing every other day. Follow-up Appointments Wound #1 Right Back o Return Appointment in 1 week. Electronic Signature(s) Signed: 04/28/2015 9:35:56 AM By: Judene Companion MD Previous Signature: 04/27/2015 9:25:31 AM Version By: Gretta Cool RN, [name], Kim RN, [name] Uncapher, Tekeshia Chauncey Cruel ([id number]) Previous Signature: 04/28/2015 8:06:26 AM Version By: [name], [name] Entered By: Judene Companion on 04/28/2015 09:35:56 Pence, [name] ([id number]) -------------------------------------------------------------------------------- Problem List Details Patient Name: HILARIA, TITSWORTH. Date of Service: 04/23/2015 1:00 PM Medical Record Number: [id number] Patient Account Number: [id number] Date of Birth/Sex: 1956/03/09 (59 y.o. Female) Treating RN: Cornell Barman Primary Care Physician: Steele Sizer Other Clinician: Referring Physician: Steele Sizer Treating Physician/Extender: Frann Rider in Treatment: 17 Active Problems ICD-10 Encounter Code Description Active Date Diagnosis E11.622 Type 2 diabetes mellitus with other skin ulcer 12/23/2014 Yes L89.112 Pressure ulcer of  right upper back, stage 2 12/23/2014 Yes S80.821A Blister (nonthermal), right lower leg, initial encounter 12/23/2014 Yes N18.6 End stage renal disease 12/23/2014 Yes Inactive Problems Resolved Problems Electronic Signature(s) Signed: 04/23/2015 5:11:41 PM By: Judene Companion MD Signed: 04/28/2015 8:06:26 AM By: [name], [name] Entered By: Judene Companion on 04/23/2015 16:25:45 Dahms, [name] ([id number]) -------------------------------------------------------------------------------- Progress Note Details [name], [name] Date of Service: 04/23/2015 1:00 PM Medical Record Number: [id number] Patient Account Number: [id number] Date of Birth/Sex: [birth year] (59 y.o. Female) Treating RN: Cornell Barman Primary Care Physician: Steele Sizer Other Clinician: Referring Physician: Steele Sizer Treating Physician/Extender: Frann Rider in Treatment: 17 Subjective Chief Complaint Information obtained from Patient Patient presents to the wound care center for a consult due non healing wound. She has an open wound on her right upper back which she's had for about a year and she recently noticed a blister on her right lower extremity about 2 weeks  ago. History of Present Illness (HPI) The following HPI elements were documented for the patient's wound: Location: right upper back and right lower extremity wounds Quality: Patient reports No Pain. Severity: Patient states wound (s) are getting better. Duration: Patient has had the wound for > 3 months prior to seeking treatment at the wound center Timing: she thought it first occurred when she was using a heating pad about a year ago after back surgery. Context: The wound appeared gradually over time Modifying Factors: Patient is currently on renal dialysis and receives treatments 3 times weekly Associated Signs and Symptoms: Patient reports having: surgery scheduled for this week for a AV fistula left arm. 59 year old patient who is known to be a diabetic and has end-stage renal disease has had several comorbidities including coronary artery disease, hypertension, hyperlipidemia, pancreatitis, anemia, previous history of hysterectomy, cholecystectomy, left-sided salivary gland excision, bilateral cataract surgery,Peritoneal dialysis catheter, hemodialysis catheter. the area on the back has also been caused by instant pressure she used to sleep on a recliner all day and has significant kyphoscoliosis. As far as the wound on her right lower extremity she's not sure how this blister occurred but she thought it has been there for about 2 weeks. No recent blood investigations available and no recent hemoglobin A1c. 12/30/2014 -- she is an assisted living facility but I believe the nurses that have not followed instructions as she had some cream applied on her back and there was a different dressing. Last week she's had a AV fistula placed on her left forearm. 01/13/2015 -- she has had some localized infection at the port site and she's been on doxycycline for this. 02/05/2015 - he has developed a small blister on her right lower extremity. 02/10/2015 -- she has developed another small  blister on her right anterior chest wall in the area where she's had tape for her dialysis access. this may just be injury caused by a tape burn. 02/18/2015 -- no new blisters and she had a dermatology opinion and they have taken a biopsy of her skin. Stuller, [name] ([id number]) She also had a left brachial AV fistula placed this week. 03/03/2015 -- though we do not have the pathology report yet the patient says she has been put on prednisone because this skin disease is possibly to do with her immune system and her dermatologist is recommended this. 03/10/2015 -- she has developed a new blister which is quite large on her right lower extremity on the shin. Objective Constitutional Vitals Time Taken: 1:08 PM, Height: 65 in,  Weight: 156 lbs, BMI: 26, Temperature: 98.2 F, Pulse: 93 bpm, Respiratory Rate: 18 breaths/min, Blood Pressure: 169/74 mmHg. Integumentary (Hair, Skin) Wound #1 status is Open. Original cause of wound was Blister. The wound is located on the Right Back. The wound measures 2.5cm length x 2.2cm width x 0.1cm depth; 4.32cm^2 area and 0.432cm^3 volume. The wound is limited to skin breakdown. There is a medium amount of sanguinous drainage noted. The wound margin is distinct with the outline attached to the wound base. There is large (67-100%) red, friable granulation within the wound bed. There is no necrotic tissue within the wound bed. The periwound skin appearance exhibited: Moist. The periwound skin appearance did not exhibit: Callus, Crepitus, Excoriation, Fluctuance, Friable, Induration, Localized Edema, Rash, Scarring, Dry/Scaly, Maceration, Atrophie Blanche, Cyanosis, Ecchymosis, Hemosiderin Staining, Mottled, Pallor, Rubor, Erythema. Periwound temperature was noted as No Abnormality. Wound #4R status is Open. Original cause of wound was Blister. The wound is located on the Right,Anterior Lower Leg. The wound measures 1.1cm length x 2.5cm width x 0.1cm depth;  2.16cm^2 area and 0.216cm^3 volume. The wound is limited to skin breakdown. There is a none present amount of drainage noted. The wound margin is indistinct and nonvisible. There is no granulation within the wound bed. The periwound skin appearance did not exhibit: Callus, Crepitus, Excoriation, Fluctuance, Friable, Induration, Localized Edema, Rash, Scarring, Dry/Scaly, Maceration, Moist, Atrophie Blanche, Cyanosis, Ecchymosis, Hemosiderin Staining, Mottled, Pallor, Rubor, Erythema. Assessment Active Problems ICD-10 E11.622 - Type 2 diabetes mellitus with other skin ulcer L89.112 - Pressure ulcer of right upper back, stage 2 S80.821A - Blister (nonthermal), right lower leg, initial encounter N18.6 - End stage renal disease Theilen, Elner S. 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 There was a Non-Viable Tissue Open Wound/Selective [phone]) debridement with total area of 2.75 sq cm performed by [name], MD. with the following instrument(s): Scissors to remove Non-Viable tissue/material including Exudate and Skin. A time out was conducted prior to the start of the procedure. There was no bleeding. The procedure was tolerated well with a pain level of 0 throughout and a pain level of 0 following the procedure. Post Debridement Measurements: 1.1cm length x 2.5cm width x 0.1cm depth; 0.216cm^3 volume. Post procedure Diagnosis Wound #4R: Same as Pre-Procedure debrided blister right. Very small. Back looks better . Treat with collagen Plan Wound Cleansing: Wound #1 Right Back: Clean wound with Normal Saline. Wound #4R Right,Anterior Lower Leg: Clean wound with Normal Saline. Primary Wound Dressing: Wound #1 Right Back: Prisma Ag - Silver Collagen Wound #4R Right,Anterior Lower Leg: Prisma Ag - Silver Collagen Secondary Dressing: Wound #1 Right Back: Boardered Foam Dressing Wound #4R Right,Anterior Lower  Leg: Boardered Foam Dressing Dressing Change Frequency: Wound #1 Right Back: Change dressing every other day. Wound #4R Right,Anterior Lower Leg: Change dressing every other day. Follow-up Appointments: Wound #1 Right Back: Return Appointment in 1 week. MYEASHA, BALLOWE ([id number]) Follow-Up Appointments: A follow-up appointment should be scheduled. Medication Reconciliation completed and provided to Patient/Care Provider. A Patient Clinical Summary of Care was provided to LD Electronic Signature(s) Signed: 04/23/2015 5:11:41 PM By: Judene Companion MD Signed: 04/28/2015 8:06:26 AM By: [name], [name] Entered By: Judene Companion on 04/23/2015 16:28:38 Grattan, [name] ([id number]) -------------------------------------------------------------------------------- SuperBill Details [name], [name]. Date of Service: 04/23/2015 Medical Record Number: [id number] Patient Account Number: [id number] Date of Birth/Sex: [birth year] (59 y.o. Female) Treating RN: Cornell Barman Primary Care Physician: Steele Sizer Other  Clinician: Referring Physician: Steele Sizer Treating Physician/Extender: Frann Rider in Treatment: 17 Diagnosis Coding ICD-10 Codes Code Description E11.622 Type 2 diabetes mellitus with other skin ulcer L89.112 Pressure ulcer of right upper back, stage 2 S80.821A Blister (nonthermal), right lower leg, initial encounter N18.6 End stage renal disease Facility Procedures CPT4 Code: 15520802 Description: 23361 - DEBRIDE WOUND 1ST 20 SQ CM OR < ICD-10 Description Diagnosis S80.821A Blister (nonthermal), right lower leg, initial encou Modifier: nter Quantity: 1 Physician Procedures CPT4 Code: 2244975 Description: 30051 - WC PHYS LEVEL 1 EST PT ICD-10 Description Diagnosis L89.112 Pressure ulcer of right upper back, stage 2 Modifier: Quantity: 1 CPT4 Code: 1021117 Description: 35670 - WC PHYS DEBR WO ANESTH 20 SQ CM ICD-10 Description Diagnosis S80.821A  Blister (nonthermal), right lower leg, initial encou Modifier: nter Quantity: 1 Electronic Signature(s) Signed: 04/23/2015 5:11:41 PM By: Judene Companion MD Signed: 04/28/2015 8:06:26 AM By: [name], [name] Entered By: Judene Companion on 04/23/2015 16:29:13

## 2015-05-05 ENCOUNTER — Encounter: Payer: Medicare Other | Admitting: Surgery

## 2015-05-05 DIAGNOSIS — L89112 Pressure ulcer of right upper back, stage 2: Secondary | ICD-10-CM | POA: Diagnosis not present

## 2015-05-05 NOTE — Progress Notes (Signed)
RIVER, MCKERCHER ([id number]) Visit Report for 05/05/2015 Arrival Information Details [name], [name]. Date of Service: 05/05/2015 10:00 AM Medical Record Number: [id number] Patient Account Number: [id number] Date of Birth/Sex: 06-Jun-1956 (59 y.o. Female) Treating RN: [name] Gouty, RN, [name], [name] Primary Care Physician: Steele Sizer Other Clinician: Referring Physician: Steele Sizer Treating Physician/Extender: [name] in Treatment: 53 Visit Information History Since Last Visit Added or deleted any medications: No Patient Arrived: Walker Any new allergies or adverse reactions: No Arrival Time: 10:18 Signs or symptoms of abuse/neglect since last No Accompanied By: self visito Transfer Assistance: None Hospitalized since last visit: No Patient Identification Verified: Yes Has Dressing in Place as Prescribed: Yes Secondary Verification Process Yes Pain Present Now: No Completed: Patient Requires Transmission- No Based Precautions: Patient Has Alerts: Yes Patient Alerts: Patient on Blood Thinner ABI R: 1.28 Electronic Signature(s) Signed: 05/05/2015 10:21:22 AM By: Regan Lemming [name], RN Entered By: Regan Lemming on 05/05/2015 10:21:22 [name], [name] ([id number]) -------------------------------------------------------------------------------- Clinic Level of Care Assessment Details [name]. Date of Service: 05/05/2015 10:00 AM Medical Record Number: [id number] Patient Account Number: [id number] Date of Birth/Sex: 1956/03/03 (59 y.o. Female) Treating RN: [name], RN, [name], [place] Primary Care Physician: Steele Sizer Other Clinician: Referring Physician: Steele Sizer Treating Physician/Extender: [name] in Treatment: 19 Clinic Level of Care Assessment Items TOOL 4 Quantity Score []  - Use when only an EandM is performed on FOLLOW-UP visit 0 ASSESSMENTS - Nursing Assessment / Reassessment X - Reassessment of Co-morbidities  (includes updates in patient status) 1 10 X - Reassessment of Adherence to Treatment Plan 1 5 ASSESSMENTS - Wound and Skin Assessment / Reassessment X - Simple Wound Assessment / Reassessment - one wound 1 5 []  - Complex Wound Assessment / Reassessment - multiple wounds 0 []  - Dermatologic / Skin Assessment (not related to wound area) 0 ASSESSMENTS - Focused Assessment []  - Circumferential Edema Measurements - multi extremities 0 []  - Nutritional Assessment / Counseling / Intervention 0 X - Lower Extremity Assessment (monofilament, tuning fork, pulses) 1 5 []  - Peripheral Arterial Disease Assessment (using hand held doppler) 0 ASSESSMENTS - Ostomy and/or Continence Assessment and Care []  - Incontinence Assessment and Management 0 []  - Ostomy Care Assessment and Management (repouching, etc.) 0 PROCESS - Coordination of Care X - Simple Patient / Family Education for ongoing care 1 15 []  - Complex (extensive) Patient / Family Education for ongoing care 0 []  - Staff obtains Programmer, systems, Records, Test Results / Process Orders 0 []  - Staff telephones HHA, Nursing Homes / Clarify orders / etc 0 []  - Routine Transfer to another Facility (non-emergent condition) 0 [name], [name] ([id number]) []  - Routine Hospital Admission (non-emergent condition) 0 []  - New Admissions / Biomedical engineer / Ordering NPWT, Apligraf, etc. 0 []  - Emergency Hospital Admission (emergent condition) 0 []  - Simple Discharge Coordination 0 []  - Complex (extensive) Discharge Coordination 0 PROCESS - Special Needs []  - Pediatric / Minor Patient Management 0 []  - Isolation Patient Management 0 []  - Hearing / Language / Visual special needs 0 []  - Assessment of Community assistance (transportation, D/C planning, etc.) 0 []  - Additional assistance / Altered mentation 0 []  - Support Surface(s) Assessment (bed, cushion, seat, etc.) 0 INTERVENTIONS - Wound Cleansing / Measurement X - Simple Wound Cleansing - one wound 1  5 []  - Complex Wound Cleansing - multiple wounds 0 X - Wound Imaging (photographs - any number of wounds) 1 5 []  - Wound Tracing (instead  of photographs) 0 X - Simple Wound Measurement - one wound 1 5 []  - Complex Wound Measurement - multiple wounds 0 INTERVENTIONS - Wound Dressings X - Small Wound Dressing one or multiple wounds 1 10 []  - Medium Wound Dressing one or multiple wounds 0 []  - Large Wound Dressing one or multiple wounds 0 []  - Application of Medications - topical 0 []  - Application of Medications - injection 0 INTERVENTIONS - Miscellaneous []  - External ear exam 0 [name], [name] ([id number]) []  - Specimen Collection (cultures, biopsies, blood, body fluids, etc.) 0 []  - Specimen(s) / Culture(s) sent or taken to Lab for analysis 0 []  - Patient Transfer (multiple staff / Harrel Lemon Lift / Similar devices) 0 []  - Simple Staple / Suture removal (25 or less) 0 []  - Complex Staple / Suture removal (26 or more) 0 []  - Hypo / Hyperglycemic Management (close monitor of Blood Glucose) 0 []  - Ankle / Brachial Index (ABI) - do not check if billed separately 0 X - Vital Signs 1 5 Has the patient been seen at the hospital within the last three years: Yes Total Score: 70 Level Of Care: New/Established - Level 2 Electronic Signature(s) Signed: 05/05/2015 10:44:51 AM By: Regan Lemming [name], RN Entered By: Regan Lemming on 05/05/2015 10:44:50 [name], [name] ([id number]) -------------------------------------------------------------------------------- Encounter Discharge Information Details [name]. Date of Service: 05/05/2015 10:00 AM Medical Record Number: [id number] Patient Account Number: [id number] Date of Birth/Sex: 1955-12-30 (59 y.o. Female) Treating RN: [name] Gouty, RN, [name], [name] Primary Care Physician: Steele Sizer Other Clinician: Referring Physician: Steele Sizer Treating Physician/Extender: [name] in Treatment: 27 Encounter Discharge Information  Items Discharge Pain Level: 0 Discharge Condition: Stable Ambulatory Status: Walker Discharge Destination: Home Transportation: Private Auto Accompanied By: self Schedule Follow-up Appointment: No Medication Reconciliation completed and provided to Patient/Care No Kemonte Ullman: Provided on Clinical Summary of Care: 05/05/2015 Form Type Recipient Paper Patient LD Electronic Signature(s) Signed: 05/05/2015 10:46:25 AM By: Ruthine Dose Previous Signature: 05/05/2015 10:46:07 AM Version By: Regan Lemming [name], RN Entered By: Ruthine Dose on 05/05/2015 10:46:25 [name], [name] ([id number]) -------------------------------------------------------------------------------- Lower Extremity Assessment Details [name], [name] Date of Service: 05/05/2015 10:00 AM Medical Record Number: [id number] Patient Account Number: [id number] Date of Birth/Sex: 01-26-1956 (59 y.o. Female) Treating RN: [name] Gouty, RN, [name], [name] Primary Care Physician: Steele Sizer Other Clinician: Referring Physician: Steele Sizer Treating Physician/Extender: [name] in Treatment: 19 Vascular Assessment Pulses: Posterior Tibial Dorsalis Pedis Palpable: [Right:Yes] Extremity colors, hair growth, and conditions: Extremity Color: [Right:Normal] Hair Growth on Extremity: [Right:Yes] Temperature of Extremity: [Right:Warm] Capillary Refill: [Right:< 3 seconds] Toe Nail Assessment Left: Right: Thick: No Discolored: No Deformed: No Improper Length and Hygiene: No Electronic Signature(s) Signed: 05/05/2015 10:23:29 AM By: Regan Lemming [name], RN Entered By: Regan Lemming on 05/05/2015 10:23:29 Birkel, [name] ([id number]) -------------------------------------------------------------------------------- Multi Wound Chart Details [name], [name] Date of Service: 05/05/2015 10:00 AM Medical Record Number: [id number] Patient Account Number: [id number] Date of Birth/Sex: January 14, 1956 (59 y.o.  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] [N/A:N/A] Comorbid History: [1:Glaucoma, Anemia, Coronary Artery Disease,  Coronary Artery Disease, Hypertension, Type II Diabetes, End Stage Renal Disease, Osteoarthritis, Neuropathy Osteoarthritis, Neuropathy] [4R:Glaucoma, Anemia, Hypertension, Type II Diabetes,  End Stage Renal Disease,] [N/A:N/A] Date Acquired: [1:10/19/2013] [4R:03/07/2015] [N/A:N/A] Weeks of Treatment: [1:19] [4R:8] [N/A:N/A] Wound Status: [1:Open] [4R:Open] [N/A:N/A] Wound Recurrence: [1:No] [4R:Yes] [N/A:N/A] Measurements L x W x D 4x3x0.1 [4R:1x2.1x0.1] [N/A:N/A] (cm) Area (cm) : [1:9.425] [4R:1.649] [N/A:N/A] Volume (cm) : [1:0.942] [4R:0.165] [N/A:N/A] % Reduction in Area: [1:75.50%] [4R:94.30%] [N/A:N/A] % Reduction in Volume: 75.50% [4R:94.30%] [N/A:N/A] Classification: [1:Category/Stage II] [4R:Partial Thickness] [N/A:N/A] HBO Classification: [1:N/A] [4R:Grade 0] [N/A:N/A] Exudate Amount: [1:Medium] [4R:Small] [N/A:N/A] Exudate Type: [1:Sanguinous] [4R:N/A] [N/A:N/A] Exudate Color: [1:red] [4R:N/A] [N/A:N/A] Wound Margin: [1:Distinct, outline attached Indistinct, nonvisible] [N/A:N/A] Granulation Amount: [1:Large (67-100%)] [4R:None Present (0%)] [N/A:N/A] Granulation Quality: [1:Red, Friable] [4R:N/A] [N/A:N/A] Necrotic Amount: [1:None Present (0%)] [4R:N/A] [N/A:N/A] Exposed Structures: [N/A:N/A] Fascia:  No Fascia: No Fat: No Fat: No Tendon: No Tendon: No Muscle: No Muscle: No Joint: No Joint: No Bone: No Bone: No Limited to Skin Limited to Skin Breakdown Breakdown Epithelialization: Medium (34-66%) None N/A Periwound Skin Texture: Edema: No Edema: No N/A Excoriation: No Excoriation: No Induration: No Induration: No Callus: No Callus: No Crepitus: No Crepitus: No Fluctuance: No Fluctuance: No Friable: No Friable: No Rash: No Rash: No Scarring: No Scarring: No Periwound Skin Moist: Yes Moist: Yes N/A Moisture: Maceration: No Maceration: No Dry/Scaly: No Dry/Scaly: No Periwound Skin Color: Atrophie Blanche: No Atrophie Blanche: No N/A Cyanosis: No Cyanosis: No Ecchymosis: No Ecchymosis: No Erythema: No Erythema: No Hemosiderin Staining: No Hemosiderin Staining: No Mottled: No Mottled: No Pallor: No Pallor: No Rubor: No Rubor: No Temperature: No Abnormality N/A N/A Tenderness on No No N/A Palpation: Wound Preparation: Ulcer Cleansing: Ulcer Cleansing: N/A Rinsed/Irrigated with Rinsed/Irrigated with Saline Saline Topical Anesthetic Topical Anesthetic Applied: None Applied: None Treatment Notes Electronic Signature(s) Signed: 05/05/2015 10:27:29 AM By: Regan Lemming [name], RN Entered By: Regan Lemming on 05/05/2015 10:27:29 [name], [name] ([id number]) -------------------------------------------------------------------------------- [place] Details [name]. Date of Service: 05/05/2015 10:00 AM Medical Record Number: [id number] Patient Account Number: [id number] Date of Birth/Sex: 12/14/1955 (59 y.o. Female) Treating RN: [name] Gouty, RN, [name], [name] Primary Care Physician: Steele Sizer Other Clinician: Referring Physician: Steele Sizer Treating Physician/Extender: [name] in Treatment: 45 Active Inactive Abuse / Safety / Falls / Self Care Management Nursing Diagnoses: Potential for  falls Goals: Patient will remain injury free Date Initiated: 12/23/2014 Goal Status: Active Patient/caregiver will verbalize understanding of skin care regimen Date Initiated: 12/23/2014 Goal Status: Active Patient/caregiver will verbalize/demonstrate measures taken to prevent injury and/or falls Date Initiated: 12/23/2014 Goal Status: Active Patient/caregiver will verbalize/demonstrate understanding of what to do in case of emergency Date Initiated: 12/23/2014 Goal Status: Active Interventions: Assess fall risk on admission and as needed Assess: immobility, friction, shearing, incontinence upon admission and as needed Assess impairment of mobility on admission and as needed per policy Assess self care needs on admission and as needed Provide education on fall prevention Notes: Wound/Skin Impairment Nursing Diagnoses: Impaired tissue integrity Knowledge deficit related to ulceration/compromised skin integrity Goals: Patient/caregiver will verbalize understanding of skin care regimen TOMEKIA, HELTON ([id number]) Date Initiated: 12/23/2014 Goal Status: Active Ulcer/skin breakdown will heal within 14 weeks Date Initiated: 12/23/2014 Goal Status: Active Interventions: Assess patient/caregiver ability to obtain necessary supplies Assess patient/caregiver ability to perform ulcer/skin care regimen upon admission and as needed Assess ulceration(s) every visit Provide education on ulcer and skin care Treatment Activities: Skin care regimen initiated : 05/05/2015 Topical wound management  initiated : 05/05/2015 Notes: Electronic Signature(s) Signed: 05/05/2015 10:27:22 AM By: Regan Lemming [name], RN Entered By: Regan Lemming on 05/05/2015 10:27:22 [name], [name] ([id number]) -------------------------------------------------------------------------------- Pain Assessment Details [name]. Date of Service: 05/05/2015 10:00 AM Medical Record Number: [id number] Patient Account  Number: [id number] Date of Birth/Sex: [birth year] (59 y.o. Female) Treating RN: [name] Gouty, RN, [name], [name] Primary Care Physician: Steele Sizer Other Clinician: Referring Physician: Steele Sizer Treating Physician/Extender: [name] in Treatment: 35 Active Problems Location of Pain Severity and Description of Pain Patient Has Paino No Site Locations Pain Management and Medication Current Pain Management: Electronic Signature(s) Signed: 05/05/2015 10:21:28 AM By: Regan Lemming [name], RN Entered By: Regan Lemming on 05/05/2015 10:21:28 [name] ([id number]) -------------------------------------------------------------------------------- Patient/Caregiver Education Details Patient Name: LEEANA, CREER. Date of Service: 05/05/2015 10:00 AM Medical Record Number: [id number] Patient Account Number: [id number] Date of Birth/Gender: 1956-03-31 (59 y.o. Female) Treating RN: [name] Gouty, RN, [name], [name] Primary Care Physician: Steele Sizer Other Clinician: Referring Physician: Steele Sizer Treating Physician/Extender: [name] in Treatment: 44 Education Assessment Education Provided To: Patient Education Topics Provided Safety: Methods: Explain/Verbal Responses: State content correctly Wound/Skin Impairment: Methods: Explain/Verbal Responses: State content correctly Electronic Signature(s) Signed: 05/05/2015 10:46:19 AM By: Regan Lemming [name], RN Entered By: Regan Lemming on 05/05/2015 10:46:19 [name], [name] ([id number]) -------------------------------------------------------------------------------- Wound Assessment Details [name], [name] Date of Service: 05/05/2015 10:00 AM Medical Record Number: [id number] Patient Account Number: [id number] Date of Birth/Sex: 1956/07/03 (59 y.o. Female) Treating RN: [name], RN, [name], [place] Primary Care Physician: Steele Sizer Other Clinician: Referring Physician: Steele Sizer Treating Physician/Extender:  [name] in Treatment: 19 Wound Status Wound Number: 1 Primary Pressure Ulcer Etiology: Wound Location: Right Back Wound Open Wounding Event: Blister Status: Date Acquired: 10/19/2013 Comorbid Glaucoma, Anemia, Coronary Artery Weeks Of Treatment: 19 History: Disease, Hypertension, Type II Clustered Wound: No Diabetes, End Stage Renal Disease, Osteoarthritis, Neuropathy Wound Measurements Length: (cm) 4 Width: (cm) 3 Depth: (cm) 0.1 Area: (cm) 9.425 Volume: (cm) 0.942 % Reduction in Area: 75.5% % Reduction in Volume: 75.5% Epithelialization: Medium (34-66%) Tunneling: No Undermining: No Wound Description Classification: Category/Stage II Wound Margin: Distinct, outline attached Exudate Amount: Medium Exudate Type: Sanguinous Exudate Color: red Foul Odor After Cleansing: No Wound Bed Granulation Amount: Large (67-100%) Exposed Structure Granulation Quality: Red, Friable Fascia Exposed: No Necrotic Amount: None Present (0%) Fat Layer Exposed: No Tendon Exposed: No Muscle Exposed: No Joint Exposed: No Bone Exposed: No Limited to Skin Breakdown Periwound Skin Texture Texture Color No Abnormalities Noted: No No Abnormalities Noted: No Callus: No Atrophie Blanche: No Crepitus: No Cyanosis: No Yates, Haili S. ([id number]) Excoriation: No Ecchymosis: No Fluctuance: No Erythema: No Friable: No Hemosiderin Staining: No Induration: No Mottled: No Localized Edema: No Pallor: No Rash: No Rubor: No Scarring: No Temperature / Pain Moisture Temperature: No Abnormality No Abnormalities Noted: No Dry / Scaly: No Maceration: No Moist: Yes Wound Preparation Ulcer Cleansing: Rinsed/Irrigated with Saline Topical Anesthetic Applied: None Treatment Notes Wound #1 (Right Back) 1. Cleansed with: Clean wound with Normal Saline 4. Dressing Applied: Other dressing (specify in notes) 5. Secondary Dressing Applied Bordered Foam Dressing Dry  Gauze Electronic Signature(s) Signed: 05/05/2015 10:26:57 AM By: Regan Lemming [name], RN Entered By: Regan Lemming on 05/05/2015 10:26:57 Siddique, [name] ([id number]) -------------------------------------------------------------------------------- Wound Assessment Details [name], [name] S. Date of Service: 05/05/2015 10:00 AM Medical Record Number: [id number] Patient Account Number: [id number] Date of Birth/Sex: 17-Mar-1956 (59 y.o. Female) Treating RN: [name], RN,  [name], [name] Primary Care Physician: Steele Sizer Other Clinician: Referring Physician: Steele Sizer Treating Physician/Extender: [name] in Treatment: 19 Wound Status Wound Number: 4R Primary Auto-immune Etiology: Wound Location: Right Lower Leg - Anterior Wound Open Wounding Event: Blister Status: Date Acquired: 03/07/2015 Comorbid Glaucoma, Anemia, Coronary Artery Weeks Of Treatment: 8 History: Disease, Hypertension, Type II Clustered Wound: No Diabetes, End Stage Renal Disease, Osteoarthritis, Neuropathy Wound Measurements Length: (cm) 1 Width: (cm) 2.1 Depth: (cm) 0.1 Area: (cm) 1.649 Volume: (cm) 0.165 % Reduction in Area: 94.3% % Reduction in Volume: 94.3% Epithelialization: None Tunneling: No Undermining: No Wound Description Classification: Partial Thickness Diabetic Severity Earleen Newport): Grade 0 Wound Margin: Indistinct, nonvisible Exudate Amount: Small Wound Bed Granulation Amount: None Present (0%) Exposed Structure Fascia Exposed: No Fat Layer Exposed: No Tendon Exposed: No Muscle Exposed: No Joint Exposed: No Bone Exposed: No Limited to Skin Breakdown Periwound Skin Texture Texture Color No Abnormalities Noted: No No Abnormalities Noted: No Callus: No Atrophie Blanche: No Crepitus: No Cyanosis: No Excoriation: No Ecchymosis: No Mckinstry, Caidyn S. ([id number]) Fluctuance: No Erythema: No Friable: No Hemosiderin Staining: No Induration: No Mottled:  No Localized Edema: No Pallor: No Rash: No Rubor: No Scarring: No Moisture No Abnormalities Noted: No Dry / Scaly: No Maceration: No Moist: Yes Wound Preparation Ulcer Cleansing: Rinsed/Irrigated with Saline Topical Anesthetic Applied: None Treatment Notes Wound #4R (Right, Anterior Lower Leg) 1. Cleansed with: Clean wound with Normal Saline 4. Dressing Applied: Other dressing (specify in notes) 5. Secondary Dressing Applied Bordered Foam Dressing Dry Gauze Electronic Signature(s) Signed: 05/05/2015 10:27:11 AM By: Regan Lemming [name], RN Entered By: Regan Lemming on 05/05/2015 10:27:11 Ducat, [name] ([id number]) -------------------------------------------------------------------------------- [place] Details [name]. Date of Service: 05/05/2015 10:00 AM Medical Record Number: [id number] Patient Account Number: [id number] Date of Birth/Sex: [birth year] (59 y.o. Female) Treating RN: [name], RN, [name], [place] Primary Care Physician: Steele Sizer Other Clinician: Referring Physician: Steele Sizer Treating Physician/Extender: [name] in Treatment: 19 Vital Signs Time Taken: 10:21 Temperature (F): 98.3 Height (in): 65 Pulse (bpm): 81 Weight (lbs): 156 Respiratory Rate (breaths/min): 18 Body Mass Index (BMI): 26 Blood Pressure (mmHg): 153/69 Reference Range: 80 - 120 mg / dl Electronic Signature(s) Signed: 05/05/2015 10:22:03 AM By: Regan Lemming [name], RN Entered By: Regan Lemming on 05/05/2015 10:22:03

## 2015-05-06 NOTE — Progress Notes (Signed)
[name], [name] ([id number]) Visit Report for 05/05/2015 Chief Complaint Document Details [name], [name] 05/05/2015 10:00 Date of Service: AM Medical Record [id number] Number: Patient Account Number: [id number] 12/26/1955 (59 y.o. Treating RN: Baruch Gouty, RN, [name], [name] Date of Birth/Sex: Female) Other Clinician: Primary Care Physician: Steele Sizer Treating [name] Referring Physician: Steele Sizer Physician/Extender: [name]: 19 Information Obtained from: Patient Chief Complaint Patient presents to the wound care center for a consult due non healing wound. She has an open wound on her right upper back which she's had for about a year and she recently noticed a blister on her right lower extremity about 2 weeks ago. Electronic Signature(s) Signed: 05/05/2015 10:40:20 AM By: [name], [name] Entered By: [name] on 05/05/2015 10:40:20 Mccubbins, [name] ([id number]) -------------------------------------------------------------------------------- HPI Details [name], [name] 05/05/2015 10:00 Date of Service: AM Medical Record [id number] Number: Patient Account Number: [id number] 1955/11/10 (59 y.o. Treating RN: Baruch Gouty, RN, [name], [name] Date of Birth/Sex: Female) Other Clinician: Primary Care Physician: Steele Sizer Treating [name] Referring Physician: Steele Sizer Physician/Extender: [name]: 19 History of Present Illness Location: right upper back and right lower extremity wounds Quality: Patient reports No Pain. Severity: Patient states wound (s) are getting better. Duration: Patient has had the wound for > 3 months prior to seeking treatment at the wound center Timing: she thought it first occurred when she was using a heating pad about a year ago after back surgery. Context: The wound appeared gradually over time Modifying Factors: Patient is currently on renal dialysis and receives treatments 3  times weekly Associated Signs and Symptoms: Patient reports having: surgery scheduled for this week for a AV fistula left arm. HPI Description: 59 year old patient who is known to be a diabetic and has end-stage renal disease has had several comorbidities including coronary artery disease, hypertension, hyperlipidemia, pancreatitis, anemia, previous history of hysterectomy, cholecystectomy, left-sided salivary gland excision, bilateral cataract surgery,Peritoneal dialysis catheter, hemodialysis catheter. the area on the back has also been caused by instant pressure she used to sleep on a recliner all day and has significant kyphoscoliosis. As far as the wound on her right lower extremity she's not sure how this blister occurred but she thought it has been there for about 2 weeks. No recent blood investigations available and no recent hemoglobin A1c. 12/30/2014 -- she is an assisted living facility but I believe the nurses that have not followed instructions as she had some cream applied on her back and there was a different dressing. Last week she's had a AV fistula placed on her left forearm. 01/13/2015 -- she has had some localized infection at the port site and she's been on doxycycline for this. 02/05/2015 - he has developed a small blister on her right lower extremity. 02/10/2015 -- she has developed another small blister on her right anterior chest wall in the area where she's had tape for her dialysis access. this may just be injury caused by a tape burn. 02/18/2015 -- no new blisters and she had a dermatology opinion and they have taken a biopsy of her skin. She also had a left brachial AV fistula placed this week. 03/03/2015 -- though we do not have the pathology report yet the patient says she has been put on prednisone because this skin disease is possibly to do with her immune system and her dermatologist is recommended this. 03/10/2015 -- she has developed a new blister which is  quite large on her right lower  extremity on the shin. [name], [name] ([id number]) Electronic Signature(s) Signed: 05/05/2015 10:40:27 AM By: [name], [name] Entered By: [name] on 05/05/2015 10:40:27 Konitzer, [name] ([id number]) -------------------------------------------------------------------------------- Physical Exam Details [name], [name] 05/05/2015 10:00 Date of Service: AM Medical Record [id number] Number: Patient Account Number: [id number] 08/31/1955 (59 y.o. Treating RN: Baruch Gouty, RN, [name], [name] Date of Birth/Sex: Female) Other Clinician: Primary Care Physician: Steele Sizer Treating [name] Referring Physician: Steele Sizer Physician/Extender: [name]: 19 Constitutional . Pulse regular. Respirations normal and unlabored. Afebrile. . Eyes Nonicteric. Reactive to light. Ears, Nose, Mouth, and Throat Lips, teeth, and gums WNL.Marland Kitchen Moist mucosa without lesions . Neck supple and nontender. No palpable supraclavicular or cervical adenopathy. Normal sized without goiter. Respiratory WNL. No retractions.. Cardiovascular Pedal Pulses WNL. No clubbing, cyanosis or edema. Lymphatic No adneopathy. No adenopathy. No adenopathy. Musculoskeletal Adexa without tenderness or enlargement.. Digits and nails w/o clubbing, cyanosis, infection, petechiae, ischemia, or inflammatory conditions.. Integumentary (Hair, Skin) No suspicious lesions. No crepitus or fluctuance. No peri-wound warmth or erythema. No masses.Marland Kitchen Psychiatric Judgement and insight Intact.. No evidence of depression, anxiety, or agitation.. Notes the ulcerated area on her back is very superficial and there is no hyper granulation tissue and overall it's looking good. Electronic Signature(s) Signed: 05/05/2015 10:40:53 AM By: [name], [name] Entered By: [name] on 05/05/2015 10:40:52 Troost, [name]  ([id number]) -------------------------------------------------------------------------------- Physician Orders Details [name], [name] 05/05/2015 10:00 Date of Service: AM Medical Record [id number] Number: Patient Account Number: [id number] [birth year] (59 y.o. Treating RN: Baruch Gouty, RN, [name], [name] Date of Birth/Sex: Female) Other Clinician: Primary Care Physician: Steele Sizer Treating [name] Referring Physician: Steele Sizer Physician/Extender: Suella Grove in Treatment: 68 Verbal / Phone Orders: Yes Clinician: [name], RN, [name], [name] Read Back and Verified: Yes Diagnosis Coding Wound Cleansing Wound #1 Right Back o Clean wound with Normal Saline. Skin Barriers/Peri-Wound Care Wound #1 Right Back o Skin Prep Wound #4R Right,Anterior Lower Leg o Skin Prep Primary Wound Dressing Wound #1 Right Back o Other: - SorbACT Secondary Dressing Wound #1 Right Back o Dry Gauze o Boardered Foam Dressing Dressing Change Frequency Wound #1 Right Back o Other: - May change outer dressing as needed for drainage. iF YOU HAVE TO CHANGE DRESSING PLEASE USE A NON ADHERENT DRESSING Follow-up Appointments Wound #1 Right Back o Return Appointment in 1 week. Electronic Signature(s) Signed: 05/05/2015 10:37:37 AM By: Regan Lemming [name], RN Signed: 05/05/2015 4:16:00 PM By: [name], [name] Rodino, [name] ([id number]) Entered By: Regan Lemming on 05/05/2015 10:37:36 Alderete, [name] ([id number]) -------------------------------------------------------------------------------- Problem List Details [name], [name] 05/05/2015 10:00 Date of Service: AM Medical Record [id number] Number: Patient Account Number: [id number] 10/11/1955 (59 y.o. Treating RN: Baruch Gouty, RN, [name], [name] Date of Birth/Sex: Female) Other Clinician: Primary Care Physician: Steele Sizer Treating [name] Referring Physician: Steele Sizer Physician/Extender: Suella Grove in  Treatment: 19 Active Problems ICD-10 Encounter Code Description Active Date Diagnosis E11.622 Type 2 diabetes mellitus with other skin ulcer 12/23/2014 Yes L89.112 Pressure ulcer of right upper back, stage 2 12/23/2014 Yes S80.821A Blister (nonthermal), right lower leg, initial encounter 12/23/2014 Yes N18.6 End stage renal disease 12/23/2014 Yes Inactive Problems Resolved Problems Electronic Signature(s) Signed: 05/05/2015 10:40:12 AM By: [name], [name] Entered By: [name] on 05/05/2015 10:40:12 Stegemann, [name] ([id number]) -------------------------------------------------------------------------------- Progress Note Details Patient Name: [name] Cornwall. 05/05/2015 10:00 Date of Service: AM Medical Record [id number] Number: Patient Account Number: [id number] 07-07-1956 (59 y.o.  Treating RN: Baruch Gouty, RN, [name], [name] Date of Birth/Sex: Female) Other Clinician: Primary Care Physician: Steele Sizer Treating [name] Referring Physician: Steele Sizer Physician/Extender: Suella Grove in Treatment: 19 Subjective Chief Complaint Information obtained from Patient Patient presents to the wound care center for a consult due non healing wound. She has an open wound on her right upper back which she's had for about a year and she recently noticed a blister on her right lower extremity about 2 weeks ago. History of Present Illness (HPI) The following HPI elements were documented for the patient's wound: Location: right upper back and right lower extremity wounds Quality: Patient reports No Pain. Severity: Patient states wound (s) are getting better. Duration: Patient has had the wound for > 3 months prior to seeking treatment at the wound center Timing: she thought it first occurred when she was using a heating pad about a year ago after back surgery. Context: The wound appeared gradually over time Modifying Factors: Patient is currently on renal dialysis and receives treatments 3  times weekly Associated Signs and Symptoms: Patient reports having: surgery scheduled for this week for a AV fistula left arm. 59 year old patient who is known to be a diabetic and has end-stage renal disease has had several comorbidities including coronary artery disease, hypertension, hyperlipidemia, pancreatitis, anemia, previous history of hysterectomy, cholecystectomy, left-sided salivary gland excision, bilateral cataract surgery,Peritoneal dialysis catheter, hemodialysis catheter. the area on the back has also been caused by instant pressure she used to sleep on a recliner all day and has significant kyphoscoliosis. As far as the wound on her right lower extremity she's not sure how this blister occurred but she thought it has been there for about 2 weeks. No recent blood investigations available and no recent hemoglobin A1c. 12/30/2014 -- she is an assisted living facility but I believe the nurses that have not followed instructions as she had some cream applied on her back and there was a different dressing. Last week she's had a AV fistula placed on her left forearm. 01/13/2015 -- she has had some localized infection at the port site and she's been on doxycycline for this. 02/05/2015 - he has developed a small blister on her right lower extremity. 02/10/2015 -- she has developed another small blister on her right anterior chest wall in the area where she's had tape for her dialysis access. this may just be injury caused by a tape burn. [name], [name] ([id number]) 02/18/2015 -- no new blisters and she had a dermatology opinion and they have taken a biopsy of her skin. She also had a left brachial AV fistula placed this week. 03/03/2015 -- though we do not have the pathology report yet the patient says she has been put on prednisone because this skin disease is possibly to do with her immune system and her dermatologist is recommended this. 03/10/2015 -- she has developed a new  blister which is quite large on her right lower extremity on the shin. Objective Constitutional Pulse regular. Respirations normal and unlabored. Afebrile. Vitals Time Taken: 10:21 AM, Height: 65 in, Weight: 156 lbs, BMI: 26, Temperature: 98.3 F, Pulse: 81 bpm, Respiratory Rate: 18 breaths/min, Blood Pressure: 153/69 mmHg. Eyes Nonicteric. Reactive to light. Ears, Nose, Mouth, and Throat Lips, teeth, and gums WNL.Marland Kitchen Moist mucosa without lesions . Neck supple and nontender. No palpable supraclavicular or cervical adenopathy. Normal sized without goiter. Respiratory WNL. No retractions.. Cardiovascular Pedal Pulses WNL. No clubbing, cyanosis or edema. Lymphatic No adneopathy. No adenopathy. No adenopathy. Musculoskeletal Adexa  without tenderness or enlargement.. Digits and nails w/o clubbing, cyanosis, infection, petechiae, ischemia, or inflammatory conditions.Marland Kitchen Psychiatric Judgement and insight Intact.. No evidence of depression, anxiety, or agitation.. General Notes: the ulcerated area on her back is very superficial and there is no hyper granulation tissue and overall it's looking good. [name] Cornwall ([id number]) Integumentary (Hair, Skin) No suspicious lesions. No crepitus or fluctuance. No peri-wound warmth or erythema. No masses.. Wound #1 status is Open. Original cause of wound was Blister. The wound is located on the Right Back. The wound measures 4cm length x 3cm width x 0.1cm depth; 9.425cm^2 area and 0.942cm^3 volume. The wound is limited to skin breakdown. There is no tunneling or undermining noted. There is a medium amount of sanguinous drainage noted. The wound margin is distinct with the outline attached to the wound base. There is large (67-100%) red, friable granulation within the wound bed. There is no necrotic tissue within the wound bed. The periwound skin appearance exhibited: Moist. The periwound skin appearance did not exhibit: Callus, Crepitus, Excoriation,  Fluctuance, Friable, Induration, Localized Edema, Rash, Scarring, Dry/Scaly, Maceration, Atrophie Blanche, Cyanosis, Ecchymosis, Hemosiderin Staining, Mottled, Pallor, Rubor, Erythema. Periwound temperature was noted as No Abnormality. Wound #4R status is Open. Original cause of wound was Blister. The wound is located on the Right,Anterior Lower Leg. The wound measures 1cm length x 2.1cm width x 0.1cm depth; 1.649cm^2 area and 0.165cm^3 volume. The wound is limited to skin breakdown. There is no tunneling or undermining noted. There is a small amount of drainage noted. The wound margin is indistinct and nonvisible. There is no granulation within the wound bed. The periwound skin appearance exhibited: Moist. The periwound skin appearance did not exhibit: Callus, Crepitus, Excoriation, Fluctuance, Friable, Induration, Localized Edema, Rash, Scarring, Dry/Scaly, Maceration, Atrophie Blanche, Cyanosis, Ecchymosis, Hemosiderin Staining, Mottled, Pallor, Rubor, Erythema. Assessment Active Problems ICD-10 E11.622 - Type 2 diabetes mellitus with other skin ulcer L89.112 - Pressure ulcer of right upper back, stage 2 S80.821A - Blister (nonthermal), right lower leg, initial encounter N18.6 - End stage renal disease I have recommended we use Sorbact and a bolster over this an a bordered foam and leave it on for a week. She can change it only if it gets damp was soaked. She will come back and see me next week. Plan [name], [name] ([id number]) Wound Cleansing: Wound #1 Right Back: Clean wound with Normal Saline. Skin Barriers/Peri-Wound Care: Wound #1 Right Back: Skin Prep Wound #4R Right,Anterior Lower Leg: Skin Prep Primary Wound Dressing: Wound #1 Right Back: Other: - SorbACT Secondary Dressing: Wound #1 Right Back: Dry Gauze Boardered Foam Dressing Dressing Change Frequency: Wound #1 Right Back: Other: - May change outer dressing as needed for drainage. iF YOU HAVE TO CHANGE  DRESSING PLEASE USE A NON ADHERENT DRESSING Follow-up Appointments: Wound #1 Right Back: Return Appointment in 1 week. I have recommended we use Sorbact and a bolster over this an a bordered foam and leave it on for a week. She can change it only if it gets damp was soaked. She will come back and see me next week. Electronic Signature(s) Signed: 05/05/2015 10:41:34 AM By: [name], [name] Entered By: [name] on 05/05/2015 10:41:34 Sawdey, [name] ([id number]) -------------------------------------------------------------------------------- SuperBill Details Patient Name: [name] Cornwall. Date of Service: 05/05/2015 Medical Record Patient Account Number: [id number] Number: [name], RN, [name], Treating RN: 12/04/1955 (59 y.o. [name] Date of Birth/Sex: Female) Other Clinician: Primary Care Physician: Steele Sizer Treating [name] Referring Physician: Steele Sizer  Physician/Extender: Suella Grove in Treatment: 19 Diagnosis Coding ICD-10 Codes Code Description E11.622 Type 2 diabetes mellitus with other skin ulcer L89.112 Pressure ulcer of right upper back, stage 2 S80.821A Blister (nonthermal), right lower leg, initial encounter N18.6 End stage renal disease Physician Procedures CPT4 Code: [id number] Description: 71062 - WC PHYS LEVEL 3 - EST PT ICD-10 Description Diagnosis E11.622 Type 2 diabetes mellitus with other skin ulcer L89.112 Pressure ulcer of right upper back, stage 2 S80.821A Blister (nonthermal), right lower leg, initial enc N18.6 End  stage renal disease Modifier: ounter Quantity: 1 Electronic Signature(s) Signed: 05/05/2015 10:41:52 AM By: [name], [name] Entered By: [name] on 05/05/2015 10:41:52

## 2015-05-12 ENCOUNTER — Encounter: Payer: Medicare Other | Admitting: Surgery

## 2015-05-12 DIAGNOSIS — L89112 Pressure ulcer of right upper back, stage 2: Secondary | ICD-10-CM | POA: Diagnosis not present

## 2015-05-13 NOTE — Progress Notes (Signed)
WANA, MOUNT ([id number]) Visit Report for 05/12/2015 Arrival Information Details [name], [name]. Date of Service: 05/12/2015 11:00 AM Medical Record Number: [id number] Patient Account Number: [id number] Date of Birth/Sex: 1956/01/01 (59 y.o. Female) Treating RN: Montey Hora Primary Care Physician: Steele Sizer Other Clinician: Referring Physician: Steele Sizer Treating Physician/Extender: Frann Rider in Treatment: 54 Visit Information History Since Last Visit Added or deleted any medications: No Patient Arrived: Walker Any new allergies or adverse reactions: No Arrival Time: 11:03 Had a fall or experienced change in No Accompanied By: self activities of daily living that may affect Transfer Assistance: None risk of falls: Patient Identification Verified: Yes Signs or symptoms of abuse/neglect since last No Secondary Verification Process Yes visito Completed: Hospitalized since last visit: No Patient Requires Transmission- No Pain Present Now: No Based Precautions: Patient Has Alerts: Yes Patient Alerts: Patient on Blood Thinner ABI R: 1.28 Electronic Signature(s) Signed: 05/12/2015 5:44:39 PM By: Montey Hora Entered By: Montey Hora on 05/12/2015 11:04:10 Benninger, [name] ([id number]) -------------------------------------------------------------------------------- Clinic Level of Care Assessment Details [name]. Date of Service: 05/12/2015 11:00 AM Medical Record Number: [id number] Patient Account Number: [id number] Date of Birth/Sex: 08/02/1955 (59 y.o. Female) Treating RN: Montey Hora Primary Care Physician: Steele Sizer Other Clinician: Referring Physician: Steele Sizer Treating Physician/Extender: Frann Rider in Treatment: 20 Clinic Level of Care Assessment Items TOOL 4 Quantity Score []  - Use when only an EandM is performed on FOLLOW-UP visit 0 ASSESSMENTS - Nursing Assessment /  Reassessment X - Reassessment of Co-morbidities (includes updates in patient status) 1 10 X - Reassessment of Adherence to Treatment Plan 1 5 ASSESSMENTS - Wound and Skin Assessment / Reassessment X - Simple Wound Assessment / Reassessment - one wound 1 5 []  - Complex Wound Assessment / Reassessment - multiple wounds 0 []  - Dermatologic / Skin Assessment (not related to wound area) 0 ASSESSMENTS - Focused Assessment []  - Circumferential Edema Measurements - multi extremities 0 []  - Nutritional Assessment / Counseling / Intervention 0 []  - Lower Extremity Assessment (monofilament, tuning fork, pulses) 0 []  - Peripheral Arterial Disease Assessment (using hand held doppler) 0 ASSESSMENTS - Ostomy and/or Continence Assessment and Care []  - Incontinence Assessment and Management 0 []  - Ostomy Care Assessment and Management (repouching, etc.) 0 PROCESS - Coordination of Care X - Simple Patient / Family Education for ongoing care 1 15 []  - Complex (extensive) Patient / Family Education for ongoing care 0 []  - Staff obtains Programmer, systems, Records, Test Results / Process Orders 0 []  - Staff telephones HHA, Nursing Homes / Clarify orders / etc 0 []  - Routine Transfer to another Facility (non-emergent condition) 0 [name], [name] ([id number]) []  - Routine Hospital Admission (non-emergent condition) 0 []  - New Admissions / Biomedical engineer / Ordering NPWT, Apligraf, etc. 0 []  - Emergency Hospital Admission (emergent condition) 0 X - Simple Discharge Coordination 1 10 []  - Complex (extensive) Discharge Coordination 0 PROCESS - Special Needs []  - Pediatric / Minor Patient Management 0 []  - Isolation Patient Management 0 []  - Hearing / Language / Visual special needs 0 []  - Assessment of Community assistance (transportation, D/C planning, etc.) 0 []  - Additional assistance / Altered mentation 0 []  - Support Surface(s) Assessment (bed, cushion, seat, etc.) 0 INTERVENTIONS - Wound Cleansing /  Measurement X - Simple Wound Cleansing - one wound 1 5 []  - Complex Wound Cleansing - multiple wounds 0 X - Wound Imaging (photographs - any number of wounds) 1 5 []  -  Wound Tracing (instead of photographs) 0 X - Simple Wound Measurement - one wound 1 5 []  - Complex Wound Measurement - multiple wounds 0 INTERVENTIONS - Wound Dressings X - Small Wound Dressing one or multiple wounds 1 10 []  - Medium Wound Dressing one or multiple wounds 0 []  - Large Wound Dressing one or multiple wounds 0 []  - Application of Medications - topical 0 []  - Application of Medications - injection 0 INTERVENTIONS - Miscellaneous []  - External ear exam 0 [name], [name] ([id number]) []  - Specimen Collection (cultures, biopsies, blood, body fluids, etc.) 0 []  - Specimen(s) / Culture(s) sent or taken to Lab for analysis 0 []  - Patient Transfer (multiple staff / Harrel Lemon Lift / Similar devices) 0 []  - Simple Staple / Suture removal (25 or less) 0 []  - Complex Staple / Suture removal (26 or more) 0 []  - Hypo / Hyperglycemic Management (close monitor of Blood Glucose) 0 []  - Ankle / Brachial Index (ABI) - do not check if billed separately 0 X - Vital Signs 1 5 Has the patient been seen at the hospital within the last three years: Yes Total Score: 75 Level Of Care: New/Established - Level 2 Electronic Signature(s) Signed: 05/12/2015 4:51:23 PM By: Montey Hora Entered By: Montey Hora on 05/12/2015 16:51:22 [name], [name] ([id number]) -------------------------------------------------------------------------------- Encounter Discharge Information Details Patient Name: [name], [name] Date of Service: 05/12/2015 11:00 AM Medical Record Number: [id number] Patient Account Number: [id number] Date of Birth/Sex: [birth year] (59 y.o. Female) Treating RN: Montey Hora Primary Care Physician: Steele Sizer Other Clinician: Referring Physician: Steele Sizer Treating Physician/Extender: Frann Rider in  Treatment: 20 Encounter Discharge Information Items Discharge Pain Level: 0 Discharge Condition: Stable Ambulatory Status: Walker Discharge Destination: Home Transportation: Private Auto Accompanied By: self Schedule Follow-up Appointment: Yes Medication Reconciliation completed and provided to Patient/Care No Tommy Minichiello: Provided on Clinical Summary of Care: 05/12/2015 Form Type Recipient Paper Patient LD Electronic Signature(s) Signed: 05/12/2015 5:44:39 PM By: Montey Hora Previous Signature: 05/12/2015 11:24:47 AM Version By: Ruthine Dose Entered By: Montey Hora on 05/12/2015 11:29:49 Battaglini, [name] ([id number]) -------------------------------------------------------------------------------- Multi Wound Chart Details [name], [name] Date of Service: 05/12/2015 11:00 AM Medical Record Number: [id number] Patient Account Number: [id number] Date of Birth/Sex: Nov 19, 1955 (59 y.o. 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] [N/A:N/A] Date Acquired: [1:10/19/2013] [4R:03/07/2015] [N/A:N/A] Weeks of Treatment: [1:20] [4R:9] [N/A:N/A] Wound Status: [1:Open] [4R:Open] [N/A:N/A] Wound Recurrence: [1:No] [4R:Yes] [N/A:N/A] Measurements L x W x D 3.2x2.8x0.1 [4R:0x0x0] [N/A:N/A] (cm) Area (cm) : [1:7.037] [4R:0] [N/A:N/A] Volume (cm) : [3:6.644] [4R:0] [N/A:N/A] % Reduction in Area: [1:81.70%] [4R:100.00%]  [N/A:N/A] % Reduction in Volume: 81.70% [4R:100.00%] [N/A:N/A] Classification: [1:Category/Stage II] [4R:Partial Thickness] [N/A:N/A] Periwound Skin Texture: No Abnormalities Noted [4R:No Abnormalities Noted] [N/A:N/A] Periwound Skin [1:No Abnormalities Noted] [4R:No Abnormalities Noted] [N/A:N/A] Moisture: Periwound Skin Color: No Abnormalities Noted [4R:No Abnormalities Noted] [N/A:N/A] Tenderness on [1:No] [4R:No] [N/A:N/A] Treatment Notes Electronic Signature(s) Signed: 05/12/2015 5:44:39 PM By: Montey Hora Entered By: Montey Hora on 05/12/2015 11:17:34 [name], [name] ([id number]) [name], [name] ([id number]) -------------------------------------------------------------------------------- [place] Details [name], [name]. Date of Service: 05/12/2015 11:00  AM Medical Record Number: [id number] Patient Account Number: [id number] Date of Birth/Sex: Aug 05, 1955 (59 y.o. Female) Treating RN: Montey Hora Primary Care Physician: Steele Sizer Other Clinician: Referring Physician: Steele Sizer Treating Physician/Extender: Frann Rider in Treatment: 20 Active Inactive Abuse / Safety / Falls / Self Care Management Nursing Diagnoses: Potential for falls Goals: Patient will remain injury free Date Initiated: 12/23/2014 Goal Status: Active Patient/caregiver will verbalize understanding of skin care regimen Date Initiated: 12/23/2014 Goal Status: Active Patient/caregiver will verbalize/demonstrate measures taken to prevent injury and/or falls Date Initiated: 12/23/2014 Goal Status: Active Patient/caregiver will verbalize/demonstrate understanding of what to do in case of emergency Date Initiated: 12/23/2014 Goal Status: Active Interventions: Assess fall risk on admission and as needed Assess: immobility, friction, shearing, incontinence upon admission and as needed Assess impairment of mobility on admission and as needed per policy Assess  self care needs on admission and as needed Provide education on fall prevention Notes: Wound/Skin Impairment Nursing Diagnoses: Impaired tissue integrity Knowledge deficit related to ulceration/compromised skin integrity Goals: Patient/caregiver will verbalize understanding of skin care regimen ANETH, SCHLAGEL ([id number]) Date Initiated: 12/23/2014 Goal Status: Active Ulcer/skin breakdown will heal within 14 weeks Date Initiated: 12/23/2014 Goal Status: Active Interventions: Assess patient/caregiver ability to obtain necessary supplies Assess patient/caregiver ability to perform ulcer/skin care regimen upon admission and as needed Assess ulceration(s) every visit Provide education on ulcer and skin care Treatment Activities: Skin care regimen initiated : 05/12/2015 Topical wound management initiated : 05/12/2015 Notes: Electronic Signature(s) Signed: 05/12/2015 5:44:39 PM By: Montey Hora Entered By: Montey Hora on 05/12/2015 11:17:26 [name], [name] ([id number]) -------------------------------------------------------------------------------- Patient/Caregiver Education Details [name]. Date of Service: 05/12/2015 11:00 AM Medical Record Number: [id number] Patient Account Number: [id number] Date of Birth/Gender: 1955-07-27 (58 y.o. Female) Treating RN: Montey Hora Primary Care Physician: Steele Sizer Other Clinician: Referring Physician: Steele Sizer Treating Physician/Extender: Frann Rider in Treatment: 20 Education Assessment Education Provided To: Patient Education Topics Provided Wound/Skin Impairment: Handouts: Other: wound care as ordered Methods: Demonstration, Explain/Verbal Responses: State content correctly Electronic Signature(s) Signed: 05/12/2015 5:44:39 PM By: Montey Hora Entered By: Montey Hora on 05/12/2015 11:30:07 [name], [name]  ([id number]) -------------------------------------------------------------------------------- Wound Assessment Details [name], [name] Date of Service: 05/12/2015 11:00 AM Medical Record Number: [id number] Patient Account Number: [id number] Date of Birth/Sex: 05-03-1956 (59 y.o. Female) Treating RN: Montey Hora Primary Care Physician: Steele Sizer Other Clinician: Referring Physician: Steele Sizer Treating Physician/Extender: Frann Rider in Treatment: 20 Wound Status Wound Number: 1 Primary Etiology: Pressure Ulcer Wound Location: Right Back Wound Status: Open Wounding Event: Blister Date Acquired: 10/19/2013 Weeks Of Treatment: 20 Clustered Wound: No Photos Photo Uploaded By: Montey Hora on 05/12/2015 16:55:19 Wound Measurements Length: (cm) 3.2 Width: (cm) 2.8 Depth: (cm) 0.1 Area: (cm) 7.037 Volume: (cm) 0.704 % Reduction in Area: 81.7% % Reduction in Volume: 81.7% Wound Description Classification: Category/Stage II Periwound Skin Texture Texture Color No Abnormalities Noted: No No Abnormalities Noted: No Moisture No Abnormalities Noted: No Treatment Notes Wound #1 (Right Back) 1. Cleansed with: Reimers, [name] ([id number]) Clean wound with Normal Saline 4. Dressing Applied: Other dressing (specify in notes) Notes siltec sorbact Electronic Signature(s) Signed: 05/12/2015 5:44:39 PM By: Montey Hora Entered By: Montey Hora on 05/12/2015 11:12:28 Hochstatter, [name] ([id number]) -------------------------------------------------------------------------------- Wound Assessment Details Patient Name: Karen, Ezzie S. Date of Service: 05/12/2015 11:00 AM Medical Record Number: [id number] Patient Account Number: [id number] Date of Birth/Sex: 28-Feb-1956 (59 y.o. Female) Treating RN: Montey Hora Primary Care Physician: Ancil Boozer,  Drue Stager Other Clinician: Referring Physician: Steele Sizer Treating Physician/Extender: Frann Rider in Treatment: 20 Wound Status Wound Number: 4R Primary Etiology: Auto-immune Wound Location: Right, Anterior Lower Leg Wound Status: Open Wounding Event: Blister Date Acquired: 03/07/2015 Weeks Of Treatment: 9 Clustered Wound: No Photos Photo Uploaded By: Montey Hora on 05/12/2015 16:55:46 Wound Measurements Length: (cm) 0 % Reduction Width: (cm) 0 % Reduction Depth: (cm) 0 Area: (cm) 0 Volume: (cm) 0 in Area: 100% in Volume: 100% Wound Description Classification: Partial Thickness Periwound Skin Texture Texture Color No Abnormalities Noted: No No Abnormalities Noted: No Moisture No Abnormalities Noted: No Electronic Signature(s) Signed: 05/12/2015 5:44:39 PM By: Posey Pronto ([id number]) Entered By: Montey Hora on 05/12/2015 11:12:40 Goar, [name] ([id number]) -------------------------------------------------------------------------------- Vitals Details Patient Name: LAGTX, Maanya S. Date of Service: 05/12/2015 11:00 AM Medical Record Number: [id number] Patient Account Number: [id number] Date of Birth/Sex: 10-26-1955 (59 y.o. Female) Treating RN: Montey Hora Primary Care Physician: Steele Sizer Other Clinician: Referring Physician: Steele Sizer Treating Physician/Extender: Frann Rider in Treatment: 20 Vital Signs Time Taken: 11:06 Temperature (F): 98.1 Height (in): 65 Pulse (bpm): 88 Weight (lbs): 156 Respiratory Rate (breaths/min): 18 Body Mass Index (BMI): 26 Blood Pressure (mmHg): 178/77 Reference Range: 80 - 120 mg / dl Electronic Signature(s) Signed: 05/12/2015 5:44:39 PM By: Montey Hora Entered By: Montey Hora on 05/12/2015 11:06:37

## 2015-05-13 NOTE — Progress Notes (Signed)
[name], [name] ([id number]) Visit Report for 05/12/2015 Chief Complaint Document Details [name], [name] 05/12/2015 11:00 Date of Service: AM Medical Record [id number] Number: Patient Account Number: [id number] 01/14/1956 (59 y.o. Treating RN: Montey Hora Date of Birth/Sex: Female) Other Clinician: Primary Care Physician: Steele Sizer Treating [name] Referring Physician: Steele Sizer Physician/Extender: Suella Grove in Treatment: 20 Information Obtained from: Patient Chief Complaint Patient presents to the wound care center for a consult due non healing wound. She has an open wound on her right upper back which she's had for about a year and she recently noticed a blister on her right lower extremity about 2 weeks ago. Electronic Signature(s) Signed: 05/12/2015 11:19:36 AM By: [name], [name] Entered By: [name] on 05/12/2015 11:19:35 [name], [name] ([id number]) -------------------------------------------------------------------------------- HPI Details [name], [name] 05/12/2015 11:00 Date of Service: AM Medical Record [id number] Number: Patient Account Number: [id number] 1955/07/26 (59 y.o. Treating RN: Montey Hora Date of Birth/Sex: Female) Other Clinician: Primary Care Physician: Steele Sizer Treating [name] Referring Physician: Steele Sizer Physician/Extender: Suella Grove in Treatment: 20 History of Present Illness Location: right upper back and right lower extremity wounds Quality: Patient reports No Pain. Severity: Patient states wound (s) are getting better. Duration: Patient has had the wound for > 3 months prior to seeking treatment at the wound center Timing: she thought it first occurred when she was using a heating pad about a year ago after back surgery. Context: The wound appeared gradually over time Modifying Factors: Patient is currently on renal dialysis and receives treatments 3 times  weekly Associated Signs and Symptoms: Patient reports having: surgery scheduled for this week for a AV fistula left arm. HPI Description: 59 year old patient who is known to be a diabetic and has end-stage renal disease has had several comorbidities including coronary artery disease, hypertension, hyperlipidemia, pancreatitis, anemia, previous history of hysterectomy, cholecystectomy, left-sided salivary gland excision, bilateral cataract surgery,Peritoneal dialysis catheter, hemodialysis catheter. the area on the back has also been caused by instant pressure she used to sleep on a recliner all day and has significant kyphoscoliosis. As far as the wound on her right lower extremity she's not sure how this blister occurred but she thought it has been there for about 2 weeks. No recent blood investigations available and no recent hemoglobin A1c. 12/30/2014 -- she is an assisted living facility but I believe the nurses that have not followed instructions as she had some cream applied on her back and there was a different dressing. Last week she's had a AV fistula placed on her left forearm. 01/13/2015 -- she has had some localized infection at the port site and she's been on doxycycline for this. 02/05/2015 - he has developed a small blister on her right lower extremity. 02/10/2015 -- she has developed another small blister on her right anterior chest wall in the area where she's had tape for her dialysis access. this may just be injury caused by a tape burn. 02/18/2015 -- no new blisters and she had a dermatology opinion and they have taken a biopsy of her skin. She also had a left brachial AV fistula placed this week. 03/03/2015 -- though we do not have the pathology report yet the patient says she has been put on prednisone because this skin disease is possibly to do with her immune system and her dermatologist is recommended this. 03/10/2015 -- she has developed a new blister which is quite  large on her right lower extremity on the shin.  [name], [name] ([id number]) Electronic Signature(s) Signed: 05/12/2015 11:19:42 AM By: [name], [name] Entered By: [name] on 05/12/2015 11:19:42 [name], [name] ([id number]) -------------------------------------------------------------------------------- Physical Exam Details Patient Name: NIMAH, UPHOFF 05/12/2015 11:00 Date of Service: AM Medical Record [id number] Number: Patient Account Number: [id number] September 26, 1955 (59 y.o. Treating RN: Montey Hora Date of Birth/Sex: Female) Other Clinician: Primary Care Physician: Steele Sizer Treating [name] Referring Physician: Steele Sizer Physician/Extender: [name]: 20 Constitutional . Pulse regular. Respirations normal and unlabored. Afebrile. . Eyes Nonicteric. Reactive to light. Ears, Nose, Mouth, and Throat Lips, teeth, and gums WNL.Marland Kitchen Moist mucosa without lesions . Neck supple and nontender. No palpable supraclavicular or cervical adenopathy. Normal sized without goiter. Respiratory WNL. No retractions.. Cardiovascular Pedal Pulses WNL. No clubbing, cyanosis or edema. Lymphatic No adneopathy. No adenopathy. No adenopathy. Musculoskeletal Adexa without tenderness or enlargement.. Digits and nails w/o clubbing, cyanosis, infection, petechiae, ischemia, or inflammatory conditions.. Integumentary (Hair, Skin) No suspicious lesions. No crepitus or fluctuance. No peri-wound warmth or erythema. No masses.Marland Kitchen Psychiatric Judgement and insight Intact.. No evidence of depression, anxiety, or agitation.. Notes overall looking excellent with a small open area in the center possibly from the adherent sorbact Electronic Signature(s) Signed: 05/12/2015 11:20:25 AM By: [name], [name] Entered By: [name] on 05/12/2015 11:20:25 [name], [name]  ([id number]) -------------------------------------------------------------------------------- Physician Orders Details Patient Name: KASHENA, NOVITSKI 05/12/2015 11:00 Date of Service: AM Medical Record [id number] Number: Patient Account Number: [id number] 02-Aug-1955 (59 y.o. Treating RN: Montey Hora Date of Birth/Sex: Female) Other Clinician: Primary Care Physician: Steele Sizer Treating [name] Referring Physician: Steele Sizer Physician/Extender: Suella Grove in Treatment: 20 Verbal / Phone Orders: Yes Clinician: Montey Hora Read Back and Verified: Yes Diagnosis Coding Wound Cleansing Wound #1 Right Back o Clean wound with Normal Saline. Skin Barriers/Peri-Wound Care Wound #1 Right Back o Skin Prep Primary Wound Dressing Wound #1 Right Back o Other: - siltec sorbact Dressing Change Frequency Wound #1 Right Back o Other: - SNF RN may change if needed for drainage - Mrs Ruberg has an extra dressing if needed Follow-up Appointments Wound #1 Right Back o Return Appointment in 1 week. Electronic Signature(s) Signed: 05/12/2015 1:38:48 PM By: [name], [name] Signed: 05/12/2015 5:44:39 PM By: Montey Hora Entered By: Montey Hora on 05/12/2015 11:19:53 [name], [name] ([id number]) -------------------------------------------------------------------------------- Problem List Details [name], [name] 05/12/2015 11:00 Date of Service: AM Medical Record [id number] Number: Patient Account Number: [id number] 06/30/1956 (59 y.o. Treating RN: Montey Hora Date of Birth/Sex: Female) Other Clinician: Primary Care Physician: Steele Sizer Treating [name] Referring Physician: Steele Sizer Physician/Extender: Suella Grove in Treatment: 20 Active Problems ICD-10 Encounter Code Description Active Date Diagnosis E11.622 Type 2 diabetes mellitus with other skin ulcer 12/23/2014 Yes L89.112 Pressure ulcer of right upper back, stage 2  12/23/2014 Yes S80.821A Blister (nonthermal), right lower leg, initial encounter 12/23/2014 Yes N18.6 End stage renal disease 12/23/2014 Yes Inactive Problems Resolved Problems Electronic Signature(s) Signed: 05/12/2015 11:19:30 AM By: [name], [name] Entered By: [name] on 05/12/2015 11:19:29 [name], [name] ([id number]) -------------------------------------------------------------------------------- Progress Note Details [name], [name] 05/12/2015 11:00 Date of Service: AM Medical Record [id number] Number: Patient Account Number: [id number] 01/03/1956 (59 y.o. Treating RN: Montey Hora Date of Birth/Sex: Female) Other Clinician: Primary Care Physician: Steele Sizer Treating [name] Referring Physician: Steele Sizer Physician/Extender: Suella Grove in Treatment: 20 Subjective Chief Complaint Information obtained from Patient Patient presents to the wound care center for a consult due non  healing wound. She has an open wound on her right upper back which she's had for about a year and she recently noticed a blister on her right lower extremity about 2 weeks ago. History of Present Illness (HPI) The following HPI elements were documented for the patient's wound: Location: right upper back and right lower extremity wounds Quality: Patient reports No Pain. Severity: Patient states wound (s) are getting better. Duration: Patient has had the wound for > 3 months prior to seeking treatment at the wound center Timing: she thought it first occurred when she was using a heating pad about a year ago after back surgery. Context: The wound appeared gradually over time Modifying Factors: Patient is currently on renal dialysis and receives treatments 3 times weekly Associated Signs and Symptoms: Patient reports having: surgery scheduled for this week for a AV fistula left arm. 60 year old patient who is known to be a diabetic and has end-stage renal disease has  had several comorbidities including coronary artery disease, hypertension, hyperlipidemia, pancreatitis, anemia, previous history of hysterectomy, cholecystectomy, left-sided salivary gland excision, bilateral cataract surgery,Peritoneal dialysis catheter, hemodialysis catheter. the area on the back has also been caused by instant pressure she used to sleep on a recliner all day and has significant kyphoscoliosis. As far as the wound on her right lower extremity she's not sure how this blister occurred but she thought it has been there for about 2 weeks. No recent blood investigations available and no recent hemoglobin A1c. 12/30/2014 -- she is an assisted living facility but I believe the nurses that have not followed instructions as she had some cream applied on her back and there was a different dressing. Last week she's had a AV fistula placed on her left forearm. 01/13/2015 -- she has had some localized infection at the port site and she's been on doxycycline for this. 02/05/2015 - he has developed a small blister on her right lower extremity. 02/10/2015 -- she has developed another small blister on her right anterior chest wall in the area where she's had tape for her dialysis access. this may just be injury caused by a tape burn. [name], [name] ([id number]) 02/18/2015 -- no new blisters and she had a dermatology opinion and they have taken a biopsy of her skin. She also had a left brachial AV fistula placed this week. 03/03/2015 -- though we do not have the pathology report yet the patient says she has been put on prednisone because this skin disease is possibly to do with her immune system and her dermatologist is recommended this. 03/10/2015 -- she has developed a new blister which is quite large on her right lower extremity on the shin. Objective Constitutional Pulse regular. Respirations normal and unlabored. Afebrile. Vitals Time Taken: 11:06 AM, Height: 65 in, Weight: 156 lbs,  BMI: 26, Temperature: 98.1 F, Pulse: 88 bpm, Respiratory Rate: 18 breaths/min, Blood Pressure: 178/77 mmHg. Eyes Nonicteric. Reactive to light. Ears, Nose, Mouth, and Throat Lips, teeth, and gums WNL.Marland Kitchen Moist mucosa without lesions . Neck supple and nontender. No palpable supraclavicular or cervical adenopathy. Normal sized without goiter. Respiratory WNL. No retractions.. Cardiovascular Pedal Pulses WNL. No clubbing, cyanosis or edema. Lymphatic No adneopathy. No adenopathy. No adenopathy. Musculoskeletal Adexa without tenderness or enlargement.. Digits and nails w/o clubbing, cyanosis, infection, petechiae, ischemia, or inflammatory conditions.Marland Kitchen Psychiatric Judgement and insight Intact.. No evidence of depression, anxiety, or agitation.. General Notes: overall looking excellent with a small open area in the center possibly from the adherent sorbact Madeira, Anastassia  S. ([id number]) Integumentary (Hair, Skin) No suspicious lesions. No crepitus or fluctuance. No peri-wound warmth or erythema. No masses.. Wound #1 status is Open. Original cause of wound was Blister. The wound is located on the Right Back. The wound measures 3.2cm length x 2.8cm width x 0.1cm depth; 7.037cm^2 area and 0.704cm^3 volume. Wound #4R status is Open. Original cause of wound was Blister. The wound is located on the Right,Anterior Lower Leg. The wound measures 0cm length x 0cm width x 0cm depth; 0cm^2 area and 0cm^3 volume. Assessment Active Problems ICD-10 E11.622 - Type 2 diabetes mellitus with other skin ulcer L89.112 - Pressure ulcer of right upper back, stage 2 S80.821A - Blister (nonthermal), right lower leg, initial encounter N18.6 - End stage renal disease Plan Wound Cleansing: Wound #1 Right Back: Clean wound with Normal Saline. Skin Barriers/Peri-Wound Care: Wound #1 Right Back: Skin Prep Primary Wound Dressing: Wound #1 Right Back: Other: - siltec sorbact Dressing Change Frequency: Wound #1  Right Back: Other: - SNF RN may change if needed for drainage - Mrs Burry has an extra dressing if needed Follow-up Appointments: Wound #1 Right Back: Return Appointment in 1 week. [place]sweeney, [name] ([id number]) I have recommended we use Siltec Sorbact foam and leave it on for a week. She can change it only if it gets damp was soaked. She will come back and see me next week. Electronic Signature(s) Signed: 05/12/2015 11:20:54 AM By: [name], [name] Entered By: [name] on 05/12/2015 11:20:53 [place]ong, [name] ([id number]) -------------------------------------------------------------------------------- SuperBill Details Patient Name: [name] Date of Service: 05/12/2015 Medical Record Number: [id number] Patient Account Number: [id number] Date of Birth/Sex: 28-May-1956 (59 y.o. Female) Treating RN: Montey Hora Primary Care Physician: Steele Sizer Other Clinician: Referring Physician: Steele Sizer Treating Physician/Extender: Frann Rider in Treatment: 20 Diagnosis Coding ICD-10 Codes Code Description E11.622 Type 2 diabetes mellitus with other skin ulcer L89.112 Pressure ulcer of right upper back, stage 2 S80.821A Blister (nonthermal), right lower leg, initial encounter N18.6 End stage renal disease Facility Procedures CPT4 Code: [id number] Description: [id number] - WOUND CARE VISIT-LEV 2 EST PT Modifier: Quantity: 1 Physician Procedures CPT4 Code: [id number] Description: 68115 - WC PHYS LEVEL 3 - EST PT ICD-10 Description Diagnosis E11.622 Type 2 diabetes mellitus with other skin ulcer L89.112 Pressure ulcer of right upper back, stage 2 S80.821A Blister (nonthermal), right lower leg, initial enc N18.6 End  stage renal disease Modifier: ounter Quantity: 1 Electronic Signature(s) Signed: 05/12/2015 4:51:38 PM By: Montey Hora Previous Signature: 05/12/2015 11:25:09 AM Version By: [name], [name] Entered By: Montey Hora on 05/12/2015 16:51:38

## 2015-05-22 ENCOUNTER — Encounter: Payer: Medicare Other | Attending: Surgery | Admitting: Surgery

## 2015-05-22 DIAGNOSIS — N186 End stage renal disease: Secondary | ICD-10-CM | POA: Insufficient documentation

## 2015-05-22 DIAGNOSIS — D649 Anemia, unspecified: Secondary | ICD-10-CM | POA: Insufficient documentation

## 2015-05-22 DIAGNOSIS — I120 Hypertensive chronic kidney disease with stage 5 chronic kidney disease or end stage renal disease: Secondary | ICD-10-CM | POA: Insufficient documentation

## 2015-05-22 DIAGNOSIS — I2510 Atherosclerotic heart disease of native coronary artery without angina pectoris: Secondary | ICD-10-CM | POA: Insufficient documentation

## 2015-05-22 DIAGNOSIS — S80821A Blister (nonthermal), right lower leg, initial encounter: Secondary | ICD-10-CM | POA: Diagnosis not present

## 2015-05-22 DIAGNOSIS — L89112 Pressure ulcer of right upper back, stage 2: Secondary | ICD-10-CM | POA: Insufficient documentation

## 2015-05-22 DIAGNOSIS — Z992 Dependence on renal dialysis: Secondary | ICD-10-CM | POA: Diagnosis not present

## 2015-05-22 DIAGNOSIS — E785 Hyperlipidemia, unspecified: Secondary | ICD-10-CM | POA: Insufficient documentation

## 2015-05-22 DIAGNOSIS — E11622 Type 2 diabetes mellitus with other skin ulcer: Secondary | ICD-10-CM | POA: Diagnosis present

## 2015-05-22 DIAGNOSIS — X58XXXA Exposure to other specified factors, initial encounter: Secondary | ICD-10-CM | POA: Insufficient documentation

## 2015-05-22 NOTE — Progress Notes (Signed)
[name], [name] ([id number]) Visit Report for 05/22/2015 Chief Complaint Document Details [name], [name] 05/22/2015 11:00 Date of Service: AM Medical Record [id number] Number: Patient Account Number: [id number] [birth year] (59 y.o. Treating RN: Cornell Barman Date of Birth/Sex: Female) Other Clinician: Primary Care Physician: Steele Sizer Treating [name] Referring Physician: Steele Sizer Physician/Extender: Suella Grove in Treatment: 21 Information Obtained from: Patient Chief Complaint Patient presents to the wound care center for a consult due non healing wound. She has an open wound on her right upper back which she's had for about a year and she recently noticed a blister on her right lower extremity about 2 weeks ago. Electronic Signature(s) Signed: 05/22/2015 11:20:36 AM By: [name], [name] Entered By: [name] on 05/22/2015 11:20:36 [name] ([id number]) -------------------------------------------------------------------------------- HPI Details [name], [name] 05/22/2015 11:00 Date of Service: AM Medical Record [id number] Number: Patient Account Number: [id number] [birth year] (59 y.o. Treating RN: Cornell Barman Date of Birth/Sex: Female) Other Clinician: Primary Care Physician: Steele Sizer Treating [name] Referring Physician: Steele Sizer Physician/Extender: Suella Grove in Treatment: 21 History of Present Illness Location: right upper back and right lower extremity wounds Quality: Patient reports No Pain. Severity: Patient states wound (s) are getting better. Duration: Patient has had the wound for > 3 months prior to seeking treatment at the wound center Timing: she thought it first occurred when she was using a heating pad about a year ago after back surgery. Context: The wound appeared gradually over time Modifying Factors: Patient is currently on renal dialysis and receives treatments 3 times weekly Associated  Signs and Symptoms: Patient reports having: surgery scheduled for this week for a AV fistula left arm. HPI Description: 59 year old patient who is known to be a diabetic and has end-stage renal disease has had several comorbidities including coronary artery disease, hypertension, hyperlipidemia, pancreatitis, anemia, previous history of hysterectomy, cholecystectomy, left-sided salivary gland excision, bilateral cataract surgery,Peritoneal dialysis catheter, hemodialysis catheter. the area on the back has also been caused by instant pressure she used to sleep on a recliner all day and has significant kyphoscoliosis. As far as the wound on her right lower extremity she's not sure how this blister occurred but she thought it has been there for about 2 weeks. No recent blood investigations available and no recent hemoglobin A1c. 12/30/2014 -- she is an assisted living facility but I believe the nurses that have not followed instructions as she had some cream applied on her back and there was a different dressing. Last week she's had a AV fistula placed on her left forearm. 01/13/2015 -- she has had some localized infection at the port site and she's been on doxycycline for this. 02/05/2015 - he has developed a small blister on her right lower extremity. 02/10/2015 -- she has developed another small blister on her right anterior chest wall in the area where she's had tape for her dialysis access. this may just be injury caused by a tape burn. 02/18/2015 -- no new blisters and she had a dermatology opinion and they have taken a biopsy of her skin. She also had a left brachial AV fistula placed this week. 03/03/2015 -- though we do not have the pathology report yet the patient says she has been put on prednisone because this skin disease is possibly to do with her immune system and her dermatologist is recommended this. 03/10/2015 -- she has developed a new blister which is quite large on her right  lower extremity on the shin.  [name] ([id number]) 05/22/2015 -- she did very well with her dressing but on trying to remove the [place] it peels away the newly healed skin. Electronic Signature(s) Signed: 05/22/2015 11:21:27 AM By: [name], [name] Entered By: [name] on 05/22/2015 11:21:27 [name] ([id number]) -------------------------------------------------------------------------------- Physical Exam Details [name], [name] 05/22/2015 11:00 Date of Service: AM Medical Record [id number] Number: Patient Account Number: [id number] 05-01-1956 (59 y.o. Treating RN: Cornell Barman Date of Birth/Sex: Female) Other Clinician: Primary Care Physician: Steele Sizer Treating [name] Referring Physician: Steele Sizer Physician/Extender: Suella Grove in Treatment: 21 Constitutional . Pulse regular. Respirations normal and unlabored. Afebrile. . Eyes Nonicteric. Reactive to light. Ears, Nose, Mouth, and Throat Lips, teeth, and gums WNL.Marland Kitchen Moist mucosa without lesions . Neck supple and nontender. No palpable supraclavicular or cervical adenopathy. Normal sized without goiter. Respiratory WNL. No retractions.. Cardiovascular Pedal Pulses WNL. No clubbing, cyanosis or edema. Chest Breasts symmetical and no nipple discharge.. Breast tissue WNL, no masses, lumps, or tenderness.. Lymphatic No adneopathy. No adenopathy. No adenopathy. Musculoskeletal Adexa without tenderness or enlargement.. Digits and nails w/o clubbing, cyanosis, infection, petechiae, ischemia, or inflammatory conditions.. Integumentary (Hair, Skin) No suspicious lesions. No crepitus or fluctuance. No peri-wound warmth or erythema. No masses.Marland Kitchen Psychiatric Judgement and insight Intact.. No evidence of depression, anxiety, or agitation.. Notes most of the wound is healed except for an area in the center which got denuded while the dressing material was removed with great  care. Electronic Signature(s) Signed: 05/22/2015 11:22:22 AM By: [name], [name] Entered By: [name] on 05/22/2015 11:22:21 [name] ([id number]) -------------------------------------------------------------------------------- Physician Orders Details [name], [name] 05/22/2015 11:00 Date of Service: AM Medical Record [id number] Number: Patient Account Number: [id number] 1956-06-28 (59 y.o. Treating RN: Cornell Barman Date of Birth/Sex: Female) Other Clinician: Primary Care Physician: Steele Sizer Treating [name] Referring Physician: Steele Sizer Physician/Extender: Suella Grove in Treatment: 21 Verbal / Phone Orders: Yes Clinician: Cornell Barman Read Back and Verified: Yes Diagnosis Coding Wound Cleansing Wound #1 Right Back o Clean wound with Normal Saline. Wound #6 Right,Midline Lower Leg o Clean wound with Normal Saline. Primary Wound Dressing Wound #1 Right Back o Other: - Sorbact Hydroactive B; If must be changed: use Vaseline gauze and foam dressing. Dressing Change Frequency Wound #1 Right Back o Other: - SNF RN may change if needed for drainage - Follow-up Appointments Wound #1 Right Back o Return Appointment in 1 week. Electronic Signature(s) Signed: 05/22/2015 12:00:49 PM By: Gretta Cool RN, [name], Kim RN, [name] Signed: 05/22/2015 3:45:45 PM By: [name], [name] Entered By: Gretta Cool RN, [name], Kim on 05/22/2015 11:18:41 Muhlbauer, Christean [name] ([id number]) -------------------------------------------------------------------------------- Problem List Details [name], [name] 05/22/2015 11:00 Date of Service: AM Medical Record [id number] Number: Patient Account Number: [id number] 11/03/1955 (59 y.o. Treating RN: Cornell Barman Date of Birth/Sex: Female) Other Clinician: Primary Care Physician: Steele Sizer Treating [name] Referring Physician: Steele Sizer Physician/Extender: Suella Grove in Treatment: 21 Active  Problems ICD-10 Encounter Code Description Active Date Diagnosis E11.622 Type 2 diabetes mellitus with other skin ulcer 12/23/2014 Yes L89.112 Pressure ulcer of right upper back, stage 2 12/23/2014 Yes S80.821A Blister (nonthermal), right lower leg, initial encounter 12/23/2014 Yes N18.6 End stage renal disease 12/23/2014 Yes Inactive Problems Resolved Problems Electronic Signature(s) Signed: 05/22/2015 11:19:58 AM By: [name], [name] Entered By: [name] on 05/22/2015 11:19:57 Ensey, Christean [name] ([id number]) -------------------------------------------------------------------------------- Progress Note Details Patient Name: [name] Hudson S. 05/22/2015 11:00 Date of Service:  AM Medical Record [id number] Number: Patient Account Number: [id number] 03-05-1956 (59 y.o. Treating RN: Cornell Barman Date of Birth/Sex: Female) Other Clinician: Primary Care Physician: Steele Sizer Treating [name] Referring Physician: Steele Sizer Physician/Extender: Suella Grove in Treatment: 21 Subjective Chief Complaint Information obtained from Patient Patient presents to the wound care center for a consult due non healing wound. She has an open wound on her right upper back which she's had for about a year and she recently noticed a blister on her right lower extremity about 2 weeks ago. History of Present Illness (HPI) The following HPI elements were documented for the patient's wound: Location: right upper back and right lower extremity wounds Quality: Patient reports No Pain. Severity: Patient states wound (s) are getting better. Duration: Patient has had the wound for > 3 months prior to seeking treatment at the wound center Timing: she thought it first occurred when she was using a heating pad about a year ago after back surgery. Context: The wound appeared gradually over time Modifying Factors: Patient is currently on renal dialysis and receives treatments 3 times weekly Associated Signs  and Symptoms: Patient reports having: surgery scheduled for this week for a AV fistula left arm. 59 year old patient who is known to be a diabetic and has end-stage renal disease has had several comorbidities including coronary artery disease, hypertension, hyperlipidemia, pancreatitis, anemia, previous history of hysterectomy, cholecystectomy, left-sided salivary gland excision, bilateral cataract surgery,Peritoneal dialysis catheter, hemodialysis catheter. the area on the back has also been caused by instant pressure she used to sleep on a recliner all day and has significant kyphoscoliosis. As far as the wound on her right lower extremity she's not sure how this blister occurred but she thought it has been there for about 2 weeks. No recent blood investigations available and no recent hemoglobin A1c. 12/30/2014 -- she is an assisted living facility but I believe the nurses that have not followed instructions as she had some cream applied on her back and there was a different dressing. Last week she's had a AV fistula placed on her left forearm. 01/13/2015 -- she has had some localized infection at the port site and she's been on doxycycline for this. 02/05/2015 - he has developed a small blister on her right lower extremity. 02/10/2015 -- she has developed another small blister on her right anterior chest wall in the area where she's had tape for her dialysis access. this may just be injury caused by a tape burn. [name], [name] ([id number]) 02/18/2015 -- no new blisters and she had a dermatology opinion and they have taken a biopsy of her skin. She also had a left brachial AV fistula placed this week. 03/03/2015 -- though we do not have the pathology report yet the patient says she has been put on prednisone because this skin disease is possibly to do with her immune system and her dermatologist is recommended this. 03/10/2015 -- she has developed a new blister which is quite large on her  right lower extremity on the shin. 05/22/2015 -- she did very well with her dressing but on trying to remove the [place] it peels away the newly healed skin. Objective Constitutional Pulse regular. Respirations normal and unlabored. Afebrile. Vitals Time Taken: 11:05 AM, Height: 65 in, Weight: 156 lbs, BMI: 26, Temperature: 98.1 F, Pulse: 84 bpm, Respiratory Rate: 18 breaths/min, Blood Pressure: 182/81 mmHg. Eyes Nonicteric. Reactive to light. Ears, Nose, Mouth, and Throat Lips, teeth, and gums WNL.Marland Kitchen Moist mucosa without lesions . Neck supple  and nontender. No palpable supraclavicular or cervical adenopathy. Normal sized without goiter. Respiratory WNL. No retractions.. Cardiovascular Pedal Pulses WNL. No clubbing, cyanosis or edema. Chest Breasts symmetical and no nipple discharge.. Breast tissue WNL, no masses, lumps, or tenderness.. Lymphatic No adneopathy. No adenopathy. No adenopathy. Musculoskeletal Adexa without tenderness or enlargement.. Digits and nails w/o clubbing, cyanosis, infection, petechiae, ischemia, or inflammatory conditions.[name] ([id number]) Psychiatric Judgement and insight Intact.. No evidence of depression, anxiety, or agitation.. General Notes: most of the wound is healed except for an area in the center which got denuded while the dressing material was removed with great care. Integumentary (Hair, Skin) No suspicious lesions. No crepitus or fluctuance. No peri-wound warmth or erythema. No masses.. Wound #1 status is Open. Original cause of wound was Blister. The wound is located on the Right Back. The wound measures 3.5cm length x 4.5cm width x 0.1cm depth; 12.37cm^2 area and 1.237cm^3 volume. The wound is limited to skin breakdown. There is a medium amount of serosanguineous drainage noted. The wound margin is indistinct and nonvisible. There is no granulation within the wound bed. There is no necrotic tissue within the wound bed.  The periwound skin appearance exhibited: Moist. The periwound skin appearance did not exhibit: Callus, Crepitus, Excoriation, Fluctuance, Friable, Induration, Localized Edema, Rash, Scarring, Dry/Scaly, Maceration, Atrophie Blanche, Cyanosis, Ecchymosis, Hemosiderin Staining, Mottled, Pallor, Rubor, Erythema. Assessment Active Problems ICD-10 E11.622 - Type 2 diabetes mellitus with other skin ulcer L89.112 - Pressure ulcer of right upper back, stage 2 S80.821A - Blister (nonthermal), right lower leg, initial encounter N18.6 - End stage renal disease To prevent the fresh epithelialized skin from tearing away each time the dressing is changed I'm going to try [place] Sorbact Hydroactive B, and if this lasts through the week we will leave this intact. If not she will use a Xeroform gauze with form and come back to see me next week. Plan Wound Cleansing: Wound #1 Right Back: Clean wound with Normal Saline. Wound #6 Right,Midline Lower Leg: Clean wound with Normal Saline. Primary Wound Dressing: [name], [name] ([id number]) Wound #1 Right Back: Other: - Sorbact Hydroactive B; If must be changed: use Vaseline gauze and foam dressing. Dressing Change Frequency: Wound #1 Right Back: Other: - SNF RN may change if needed for drainage - Follow-up Appointments: Wound #1 Right Back: Return Appointment in 1 week. To prevent the fresh epithelialized skin from tearing away each time the dressing is changed I'm going to try [place]ed Sorbact Hydroactive B, and if this lasts through the week we will leave this intact. If not she will use a Xeroform gauze with form and come back to see me next week. Electronic Signature(s) Signed: 05/22/2015 11:24:20 AM By: [name], [name] Entered By: [name] on 05/22/2015 11:24:20 [place], Christean [name] ([id number]) -------------------------------------------------------------------------------- SuperBill Details [name]. Date of Service:  05/22/2015 Medical Record Number: [id number] Patient Account Number: [id number] Date of Birth/Sex: 09-01-1955 (60 y.o. Female) Treating RN: Cornell Barman Primary Care Physician: Steele Sizer Other Clinician: Referring Physician: Steele Sizer Treating Physician/Extender: Frann Rider in Treatment: 21 Diagnosis Coding ICD-10 Codes Code Description E11.622 Type 2 diabetes mellitus with other skin ulcer L89.112 Pressure ulcer of right upper back, stage 2 S80.821A Blister (nonthermal), right lower leg, initial encounter N18.6 End stage renal disease Facility Procedures CPT4 Code: [id number] Description: 84536 - WOUND CARE VISIT-LEV 2 EST PT Modifier: Quantity: 1 Physician Procedures CPT4 Code: [id number] Description: 99213 - WC PHYS LEVEL 3 - EST PT  ICD-10 Description Diagnosis E11.622 Type 2 diabetes mellitus with other skin ulcer L89.112 Pressure ulcer of right upper back, stage 2 S80.821A Blister (nonthermal), right lower leg, initial enc N18.6 End  stage renal disease Modifier: ounter Quantity: 1 Electronic Signature(s) Signed: 05/22/2015 11:25:23 AM By: [name], [name] Entered By: [name] on 05/22/2015 11:25:23

## 2015-05-22 NOTE — Progress Notes (Signed)
[name] ([id number]) Visit Report for 05/22/2015 Arrival Information Details [name], [name]. Date of Service: 05/22/2015 11:00 AM Medical Record Number: [id number] Patient Account Number: [id number] Date of Birth/Sex: 1956/02/06 (59 y.o. Female) Treating RN: Cornell Barman Primary Care Physician: Steele Sizer Other Clinician: Referring Physician: Steele Sizer Treating Physician/Extender: Frann Rider in Treatment: 21 Visit Information History Since Last Visit Added or deleted any medications: Yes Patient Arrived: Walker Any new allergies or adverse reactions: No Arrival Time: 11:03 Had a fall or experienced change in No Accompanied By: self activities of daily living that may affect Transfer Assistance: None risk of falls: Patient Identification Verified: Yes Signs or symptoms of abuse/neglect since last No Secondary Verification Process Yes visito Completed: Hospitalized since last visit: No Patient Requires Transmission- No Has Dressing in Place as Prescribed: Yes Based Precautions: Pain Present Now: No Patient Has Alerts: Yes Patient Alerts: Patient on Blood Thinner ABI R: 1.28 Electronic Signature(s) Signed: 05/22/2015 12:00:49 PM By: Gretta Cool, RN, [name], Kim RN, [name] Entered By: Gretta Cool, RN, [name], Kim on 05/22/2015 11:04:02 [name] ([id number]) -------------------------------------------------------------------------------- Clinic Level of Care Assessment Details [name]. Date of Service: 05/22/2015 11:00 AM Medical Record Number: [id number] Patient Account Number: [id number] Date of Birth/Sex: [birth year] (59 y.o. Female) Treating RN: Cornell Barman Primary Care Physician: Steele Sizer Other Clinician: Referring Physician: Steele Sizer Treating Physician/Extender: Frann Rider in Treatment: 21 Clinic Level of Care Assessment Items TOOL 4 Quantity Score []  - Use when only an EandM is performed on FOLLOW-UP  visit 0 ASSESSMENTS - Nursing Assessment / Reassessment []  - Reassessment of Co-morbidities (includes updates in patient status) 0 X - Reassessment of Adherence to Treatment Plan 1 5 ASSESSMENTS - Wound and Skin Assessment / Reassessment X - Simple Wound Assessment / Reassessment - one wound 1 5 []  - Complex Wound Assessment / Reassessment - multiple wounds 0 []  - Dermatologic / Skin Assessment (not related to wound area) 0 ASSESSMENTS - Focused Assessment []  - Circumferential Edema Measurements - multi extremities 0 []  - Nutritional Assessment / Counseling / Intervention 0 []  - Lower Extremity Assessment (monofilament, tuning fork, pulses) 0 []  - Peripheral Arterial Disease Assessment (using hand held doppler) 0 ASSESSMENTS - Ostomy and/or Continence Assessment and Care []  - Incontinence Assessment and Management 0 []  - Ostomy Care Assessment and Management (repouching, etc.) 0 PROCESS - Coordination of Care X - Simple Patient / Family Education for ongoing care 1 15 []  - Complex (extensive) Patient / Family Education for ongoing care 0 X - Staff obtains Programmer, systems, Records, Test Results / Process Orders 1 10 []  - Staff telephones HHA, Nursing Homes / Clarify orders / etc 0 []  - Routine Transfer to another Facility (non-emergent condition) 0 [name], [name] ([id number]) []  - Routine Hospital Admission (non-emergent condition) 0 []  - New Admissions / Biomedical engineer / Ordering NPWT, Apligraf, etc. 0 []  - Emergency Hospital Admission (emergent condition) 0 X - Simple Discharge Coordination 1 10 []  - Complex (extensive) Discharge Coordination 0 PROCESS - Special Needs []  - Pediatric / Minor Patient Management 0 []  - Isolation Patient Management 0 []  - Hearing / Language / Visual special needs 0 []  - Assessment of Community assistance (transportation, D/C planning, etc.) 0 []  - Additional assistance / Altered mentation 0 []  - Support Surface(s) Assessment (bed, cushion, seat,  etc.) 0 INTERVENTIONS - Wound Cleansing / Measurement X - Simple Wound Cleansing - one wound 1 5 []  - Complex Wound Cleansing - multiple wounds  0 X - Wound Imaging (photographs - any number of wounds) 1 5 []  - Wound Tracing (instead of photographs) 0 X - Simple Wound Measurement - one wound 1 5 []  - Complex Wound Measurement - multiple wounds 0 INTERVENTIONS - Wound Dressings X - Small Wound Dressing one or multiple wounds 1 10 []  - Medium Wound Dressing one or multiple wounds 0 []  - Large Wound Dressing one or multiple wounds 0 []  - Application of Medications - topical 0 []  - Application of Medications - injection 0 INTERVENTIONS - Miscellaneous []  - External ear exam 0 [name], [name] ([id number]) []  - Specimen Collection (cultures, biopsies, blood, body fluids, etc.) 0 []  - Specimen(s) / Culture(s) sent or taken to Lab for analysis 0 []  - Patient Transfer (multiple staff / Harrel Lemon Lift / Similar devices) 0 []  - Simple Staple / Suture removal (25 or less) 0 []  - Complex Staple / Suture removal (26 or more) 0 []  - Hypo / Hyperglycemic Management (close monitor of Blood Glucose) 0 []  - Ankle / Brachial Index (ABI) - do not check if billed separately 0 X - Vital Signs 1 5 Has the patient been seen at the hospital within the last three years: Yes Total Score: 75 Level Of Care: New/Established - Level 2 Electronic Signature(s) Signed: 05/22/2015 12:00:49 PM By: Gretta Cool, RN, [name], Kim RN, [name] Entered By: Gretta Cool, RN, [name], Kim on 05/22/2015 11:22:29 [name] ([id number]) -------------------------------------------------------------------------------- Encounter Discharge Information Details [name]. Date of Service: 05/22/2015 11:00 AM Medical Record Number: [id number] Patient Account Number: [id number] Date of Birth/Sex: Mar 11, 1956 (59 y.o. Female) Treating RN: Cornell Barman Primary Care Physician: Steele Sizer Other Clinician: Referring Physician: Steele Sizer Treating Physician/Extender: Frann Rider in Treatment: 21 Encounter Discharge Information Items Discharge Pain Level: 0 Discharge Condition: Stable Ambulatory Status: Wheelchair Discharge Destination: Home Transportation: Private Auto Accompanied By: self Schedule Follow-up Appointment: Yes Medication Reconciliation completed and provided to Patient/Care Yes Elveta Rape: Provided on Clinical Summary of Care: 05/22/2015 Form Type Recipient Paper Patient LD Electronic Signature(s) Signed: 05/22/2015 12:00:49 PM By: Gretta Cool RN, [name], Kim RN, [name] Previous Signature: 05/22/2015 11:20:53 AM Version By: Ruthine Dose Entered By: Gretta Cool RN, [name], Kim on 05/22/2015 11:23:20 Ojo, [name] ([id number]) -------------------------------------------------------------------------------- Multi Wound Chart Details [name]. Date of Service: 05/22/2015 11:00 AM Medical Record Number: [id number] Patient Account Number: [id number] Date of Birth/Sex: Dec 16, 1955 (59 y.o. 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] Comorbid History: [1:Glaucoma, Anemia, Coronary Artery Disease, Coronary Artery Disease, Hypertension, Type II Diabetes, End Stage Renal Disease, Osteoarthritis, Neuropathy Osteoarthritis, Neuropathy] [6:Glaucoma, Anemia, Hypertension, Type II Diabetes,  End Stage Renal Disease,]  [N/A:N/A] Date Acquired: [1:10/19/2013] [6:05/19/2015] [N/A:N/A] Weeks of Treatment: [1:21] [6:0] [N/A:N/A] Wound Status: [1:Open] [6:Open] [N/A:N/A] Measurements L x W x D 3.5x4.5x0.1 [6:1.5x1.2x0.1] [N/A:N/A] (cm) Area (cm) : [1:12.37] [6:1.414] [N/A:N/A] Volume (cm) : [1:1.237] [6:0.141] [N/A:N/A] % Reduction in Area: [1:67.90%] [6:N/A] [N/A:N/A] % Reduction in Volume: 67.90% [6:N/A] [N/A:N/A] Classification: [1:Category/Stage II] [6:Grade 0] [N/A:N/A] Exudate Amount: [1:Medium] [6:None Present] [N/A:N/A] Exudate Type: [1:Serosanguineous] [6:N/A] [N/A:N/A] Exudate Color: [1:red,  brown] [6:N/A] [N/A:N/A] Wound Margin: [1:Indistinct, nonvisible] [6:Indistinct, nonvisible] [N/A:N/A] Granulation Amount: [1:None Present (0%)] [6:None Present (0%)] [N/A:N/A] Necrotic Amount: [1:None Present (0%)] [6:None Present (0%)] [N/A:N/A] Exposed Structures: [1:Fascia: No Fat: No Tendon: No] [6:Fascia: No Fat: No Tendon: No] [N/A:N/A] Muscle: No Muscle: No Joint: No Joint: No Bone: No Bone: No Limited to Skin Limited to Skin Breakdown Breakdown Periwound Skin Texture: Edema: No Edema: No N/A Excoriation: No Excoriation: No Induration: No Induration: No Callus: No Callus: No Crepitus: No Crepitus: No Fluctuance: No Fluctuance: No Friable: No Friable: No Rash: No Rash: No Scarring: No Scarring: No Periwound Skin Moist: Yes Maceration: No N/A Moisture: Maceration: No Moist: No Dry/Scaly: No Dry/Scaly: No Periwound Skin Color: Atrophie Blanche: No Atrophie Blanche: No N/A Cyanosis: No Cyanosis: No Ecchymosis: No Ecchymosis: No Erythema: No Erythema: No Hemosiderin Staining: No Hemosiderin Staining: No Mottled: No Mottled: No Pallor: No Pallor: No Rubor: No Rubor: No Tenderness on No No N/A Palpation: Wound Preparation: Ulcer Cleansing: N/A N/A Rinsed/Irrigated with Saline Topical Anesthetic Applied: None Treatment Notes Electronic Signature(s) Signed:  05/22/2015 12:00:49 PM By: Gretta Cool, RN, [name], Kim RN, [name] Entered By: Gretta Cool, RN, [name], Kim on 05/22/2015 11:15:55 Kugler, [name] ([id number]) -------------------------------------------------------------------------------- Multi-Disciplinary Care Plan Details [name], [name]. Date of Service: 05/22/2015 11:00 AM Medical Record Number: [id number] Patient Account Number: [id number] Date of Birth/Sex: [birth year] (59 y.o. Female) Treating RN: Cornell Barman Primary Care Physician: Steele Sizer Other Clinician: Referring Physician: Steele Sizer Treating Physician/Extender: Frann Rider in Treatment: 21 Active Inactive Abuse / Safety / Falls / Self Care Management Nursing Diagnoses: Potential for falls Goals: Patient will remain injury free Date Initiated: 12/23/2014 Goal Status: Active Patient/caregiver will verbalize understanding of skin care regimen Date Initiated: 12/23/2014 Goal Status: Active Patient/caregiver will verbalize/demonstrate measures taken to prevent injury and/or falls Date Initiated: 12/23/2014 Goal Status: Active Patient/caregiver will verbalize/demonstrate understanding of what to do in case of emergency Date Initiated: 12/23/2014 Goal Status: Active Interventions: Assess fall risk on admission and as needed Assess: immobility, friction, shearing, incontinence upon admission and as needed Assess impairment of mobility on admission and as needed per policy Assess self care needs on admission and as needed Provide education on fall prevention Notes: Wound/Skin Impairment Nursing Diagnoses: Impaired tissue integrity Knowledge deficit related to ulceration/compromised skin integrity Goals: Patient/caregiver will verbalize understanding of skin care regimen [name], [name] ([id number]) Date Initiated: 12/23/2014 Goal Status: Active Ulcer/skin breakdown will heal within 14 weeks Date Initiated: 12/23/2014 Goal Status: Active Interventions: Assess  patient/caregiver ability to obtain necessary supplies Assess patient/caregiver ability to perform ulcer/skin care regimen upon admission and as needed Assess ulceration(s) every visit Provide education on ulcer and skin care Treatment Activities: Skin care regimen initiated : 05/22/2015 Topical wound management initiated : 05/22/2015 Notes: Electronic Signature(s) Signed: 05/22/2015 12:00:49 PM By: Gretta Cool, RN, [name], Kim RN, [name] Entered By: Gretta Cool, RN, [name], Kim on 05/22/2015 11:15:11 [name], [name] ([id number]) -------------------------------------------------------------------------------- Pain Assessment Details [name]. Date of Service: 05/22/2015 11:00 AM Medical Record Number: [id number] Patient Account Number: [id number] Date of Birth/Sex: Aug 15, 1955 (59 y.o. Female) Treating RN: Cornell Barman Primary Care Physician: Steele Sizer Other Clinician: Referring Physician: Steele Sizer Treating Physician/Extender: Frann Rider in Treatment: 21 Active Problems Location of Pain Severity and Description of Pain Patient Has Paino No Site Locations Pain Management and Medication Current Pain Management: Electronic Signature(s) Signed: 05/22/2015 12:00:49 PM By: Gretta Cool, RN, [name], Kim RN, [name] Entered By: Gretta Cool, RN, [name], Kim on 05/22/2015 11:04:08  [name], [name] ([id number]) -------------------------------------------------------------------------------- Patient/Caregiver Education Details Patient Name: [name], Marzelle S. Date of Service: 05/22/2015 11:00 AM Medical Record Number: [id number] Patient Account Number: [id number] Date of Birth/Gender: [birth year] (59 y.o. Female) Treating RN: Cornell Barman Primary Care Physician: Steele Sizer Other Clinician: Referring Physician: Steele Sizer Treating Physician/Extender: Frann Rider in Treatment: 21 Education Assessment Education Provided To: Patient Education Topics Provided Wound/Skin  Impairment: Handouts: Caring for Your Ulcer, Other: wound care as prescribed Methods: Demonstration, Explain/Verbal Responses: State content correctly Electronic Signature(s) Signed: 05/22/2015 12:00:49 PM By: Gretta Cool, RN, [name], Kim RN, [name] Entered By: Gretta Cool, RN, [name], Kim on 05/22/2015 11:23:40 [name], [name] ([id number]) -------------------------------------------------------------------------------- Wound Assessment Details [name], [name]. Date of Service: 05/22/2015 11:00 AM Medical Record Number: [id number] Patient Account Number: [id number] Date of Birth/Sex: 03/24/1956 (59 y.o. Female) Treating RN: Cornell Barman Primary Care Physician: Steele Sizer Other Clinician: Referring Physician: Steele Sizer Treating Physician/Extender: Frann Rider in Treatment: 21 Wound Status Wound Number: 1 Primary Pressure Ulcer Etiology: Wound Location: Right Back Wound Open Wounding Event: Blister Status: Date Acquired: 10/19/2013 Comorbid Glaucoma, Anemia, Coronary Artery Weeks Of Treatment: 21 History: Disease, Hypertension, Type II Clustered Wound: No Diabetes, End Stage Renal Disease, Osteoarthritis, Neuropathy Photos Photo Uploaded By: Gretta Cool, RN, [name], Kim on 05/22/2015 14:03:34 Wound Measurements Length: (cm) 3.5 Width: (cm) 4.5 Depth: (cm) 0.1 Area: (cm) 12.37 Volume: (cm) 1.237 % Reduction in Area: 67.9% % Reduction in Volume: 67.9% Wound Description Classification: Category/Stage II Wound Margin: Indistinct, nonvisible Exudate Amount: Medium Exudate Type: Serosanguineous Exudate Color: red, brown Wound Bed Granulation Amount: None Present (0%) Exposed Structure Necrotic Amount: None Present (0%) Fascia Exposed: No Fat Layer Exposed: No Nalepa, [name] ([id number]) Tendon Exposed: No Muscle Exposed: No Joint Exposed: No Bone Exposed: No Limited to Skin Breakdown Periwound Skin Texture Texture Color No Abnormalities Noted: No No Abnormalities  Noted: No Callus: No Atrophie Blanche: No Crepitus: No Cyanosis: No Excoriation: No Ecchymosis: No Fluctuance: No Erythema: No Friable: No Hemosiderin Staining: No Induration: No Mottled: No Localized Edema: No Pallor: No Rash: No Rubor: No Scarring: No Moisture No Abnormalities Noted: No Dry / Scaly: No Maceration: No Moist: Yes Wound Preparation Ulcer Cleansing: Rinsed/Irrigated with Saline Topical Anesthetic Applied: None Treatment Notes Wound #1 (Right Back) 1. Cleansed with: Clean wound with Normal Saline Notes siltec sorbact hydroactive B Electronic Signature(s) Signed: 05/22/2015 12:00:49 PM By: Gretta Cool, RN, [name], Kim RN, [name] Entered By: Gretta Cool, RN, [name], Kim on 05/22/2015 11:10:15 [name], [name] ([id number]) -------------------------------------------------------------------------------- Vitals Details [name]. Date of Service: 05/22/2015 11:00 AM Medical Record Number: [id number] Patient Account Number: [id number] Date of Birth/Sex: 1956-03-18 (59 y.o. Female) Treating RN: Cornell Barman Primary Care Physician: Steele Sizer Other Clinician: Referring Physician: Steele Sizer Treating Physician/Extender: Frann Rider in Treatment: 21 Vital Signs Time Taken: 11:05 Temperature (F): 98.1 Height (in): 65 Pulse (bpm): 84 Weight (lbs): 156 Respiratory Rate (breaths/min): 18 Body Mass Index (BMI): 26 Blood Pressure (mmHg): 182/81 Reference Range: 80 - 120 mg / dl Electronic Signature(s) Signed: 05/22/2015 12:00:49 PM By: Gretta Cool, RN, [name], Kim RN, [name] Entered By: Gretta Cool, RN, [name], Kim on 05/22/2015 11:06:48

## 2015-05-29 ENCOUNTER — Encounter: Payer: Medicare Other | Admitting: Surgery

## 2015-05-29 DIAGNOSIS — E11622 Type 2 diabetes mellitus with other skin ulcer: Secondary | ICD-10-CM | POA: Diagnosis not present

## 2015-05-29 NOTE — Progress Notes (Addendum)
[name], [name] ([id number]) Visit Report for 05/29/2015 Chief Complaint Document Details [name], [name] 05/29/2015 8:00 Date of Service: AM Medical Record [id number] Number: Patient Account Number: [id number] [birth year] (59 y.o. Treating RN: [name] Date of Birth/Sex: Female) Other Clinician: Primary Care Physician: [name] Treating [name] Referring Physician: Steele [name] Physician/Extender: Suella Grove in Treatment: 39 Information Obtained from: Patient Chief Complaint Patient presents to the wound care center for a consult due non healing wound. She has an open wound on her right upper back which she's had for about a year and she recently noticed a blister on her right lower extremity about 2 weeks ago. Electronic Signature(s) Signed: 05/29/2015 8:23:52 AM By: [name] Fudge MD, [name] Entered By: [name] on 05/29/2015 08:23:52 [name], [name] ([id number]) -------------------------------------------------------------------------------- HPI Details [name], [name] 05/29/2015 8:00 Date of Service: AM Medical Record [id number] Number: Patient Account Number: [id number] 1955/08/20 (59 y.o. Treating RN: [name] Date of Birth/Sex: Female) Other Clinician: Primary Care Physician: [name] Treating [name] Referring Physician: Steele [name] Physician/Extender: [name]: 22 History of Present Illness Location: right upper back and right lower extremity wounds Quality: Patient reports No Pain. Severity: Patient states wound (s) are getting better. Duration: Patient has had the wound for > 3 months prior to seeking treatment at the wound center Timing: she thought it first occurred when she was using a heating pad about a year ago after back surgery. Context: The wound appeared gradually over time Modifying Factors: Patient is currently on renal dialysis and receives treatments 3 times  weekly Associated Signs and Symptoms: Patient reports having: surgery scheduled for this week for a AV fistula left arm. HPI Description: 59 year old patient who is known to be a diabetic and has end-stage renal disease has had several comorbidities including coronary artery disease, hypertension, hyperlipidemia, pancreatitis, anemia, previous history of hysterectomy, cholecystectomy, left-sided salivary gland excision, bilateral cataract surgery,Peritoneal dialysis catheter, hemodialysis catheter. the area on the back has also been caused by instant pressure she used to sleep on a recliner all day and has significant kyphoscoliosis. As far as the wound on her right lower extremity she's not sure how this blister occurred but she thought it has been there for about 2 weeks. No recent blood investigations available and no recent hemoglobin A1c. 12/30/2014 -- she is an assisted living facility but I believe the nurses that have not followed instructions as she had some cream applied on her back and there was a different dressing. Last week she's had a AV fistula placed on her left forearm. 01/13/2015 -- she has had some localized infection at the port site and she's been on doxycycline for this. 02/05/2015 - he has developed a small blister on her right lower extremity. 02/10/2015 -- she has developed another small blister on her right anterior chest wall in the area where she's had tape for her dialysis access. this may just be injury caused by a tape burn. 02/18/2015 -- no new blisters and she had a dermatology opinion and they have taken a biopsy of her skin. She also had a left brachial AV fistula placed this week. 03/03/2015 -- though we do not have the pathology report yet the patient says she has been put on prednisone because this skin disease is possibly to do with her immune system and her dermatologist is recommended this. 03/10/2015 -- she has developed a new blister which is quite  large on her right lower extremity on the shin.  [name], [name] ([id number]) 05/22/2015 -- she did very well with her dressing but on trying to remove the [place] it peels away the newly healed skin. Electronic Signature(s) Signed: 05/29/2015 8:23:56 AM By: [name] Fudge MD, [name] Entered By: [name] on 05/29/2015 08:23:56 [name], [name] ([id number]) -------------------------------------------------------------------------------- Physical Exam Details Patient Name: [name], [name] 05/29/2015 8:00 Date of Service: AM Medical Record [id number] Number: Patient Account Number: [id number] [birth year] (59 y.o. Treating RN: [name] Date of Birth/Sex: Female) Other Clinician: Primary Care Physician: [name] Treating [name] Referring Physician: Steele [name] Physician/Extender: [name]: 22 Constitutional . Pulse regular. Respirations normal and unlabored. Afebrile. . Eyes Nonicteric. Reactive to light. Ears, Nose, Mouth, and Throat Lips, teeth, and gums WNL.Marland Kitchen Moist mucosa without lesions . Neck supple and nontender. No palpable supraclavicular or cervical adenopathy. Normal sized without goiter. Respiratory WNL. No retractions.. Cardiovascular Pedal Pulses WNL. No clubbing, cyanosis or edema. Chest Breasts symmetical and no nipple discharge.. Breast tissue WNL, no masses, lumps, or tenderness.. Lymphatic No adneopathy. No adenopathy. No adenopathy. Musculoskeletal Adexa without tenderness or enlargement.. Digits and nails w/o clubbing, cyanosis, infection, petechiae, ischemia, or inflammatory conditions.. Integumentary (Hair, Skin) No suspicious lesions. No crepitus or fluctuance. No peri-wound warmth or erythema. No masses.Marland Kitchen Psychiatric Judgement and insight Intact.. No evidence of depression, anxiety, or agitation.. Notes most of the wound is healed except for an area in the center. She has a blister on the right leg which  is superficial. Electronic Signature(s) Signed: 05/29/2015 8:35:57 AM By: [name] Fudge MD, [name] Entered By: [name] on 05/29/2015 08:35:56 [name], [name] ([id number]) -------------------------------------------------------------------------------- Physician Orders Details [name]. 05/29/2015 8:00 Date of Service: AM Medical Record [id number] Number: Patient Account Number: [id number] 1955/11/10 (59 y.o. Treating RN: Cornell Barman Date of Birth/Sex: Female) Other Clinician: Primary Care Physician: [name] Treating [name] Referring Physician: Steele [name] Physician/Extender: Suella Grove in Treatment: 22 Verbal / Phone Orders: Yes Clinician: Cornell Barman Read Back and Verified: Yes Diagnosis Coding ICD-10 Coding Code Description E11.622 Type 2 diabetes mellitus with other skin ulcer L89.112 Pressure ulcer of right upper back, stage 2 S80.821A Blister (nonthermal), right lower leg, initial encounter N18.6 End stage renal disease Wound Cleansing Wound #1 Right Back o Clean wound with Normal Saline. Primary Wound Dressing Wound #1 Right Back o Other: - Sorbact Hydroactive B; If must be changed: use Vaseline gauze and foam dressing. Wound #6 Right,Midline Lower Leg o Other: - mepitel lite Dressing Change Frequency Wound #1 Right Back o Other: - SNF RN may change if needed for drainage - Follow-up Appointments Wound #1 Right Back o Return Appointment in 1 week. Electronic Signature(s) Signed: 05/29/2015 4:13:02 PM By: [name] Fudge MD, [name] Signed: 05/29/2015 4:39:51 PM By: Gretta Cool RN, [name], Kim RN, [name] Entered By: Gretta Cool, RN, [name], Kim on 05/29/2015 08:42:18 [name], [name] ([id number]) -------------------------------------------------------------------------------- Problem List Details Patient Name: GUILA, HOMOLA 05/29/2015 8:00 Date of Service: AM Medical Record [id number] Number: Patient Account Number: [id number] 09-04-1955  (59 y.o. Treating RN: [name] Date of Birth/Sex: Female) Other Clinician: Primary Care Physician: [name] Treating [name] Referring Physician: Steele [name] Physician/Extender: Suella Grove in Treatment: 22 Active Problems ICD-10 Encounter Code Description Active Date Diagnosis E11.622 Type 2 diabetes mellitus with other skin ulcer 12/23/2014 Yes L89.112 Pressure ulcer of right upper back, stage 2 12/23/2014 Yes S80.821A Blister (nonthermal), right lower leg, initial encounter 12/23/2014 Yes N18.6 End stage renal disease 12/23/2014 Yes Inactive Problems Resolved Problems Electronic Signature(s) Signed:  05/29/2015 8:23:47 AM By: [name] Fudge MD, [name] Entered By: [name] on 05/29/2015 08:23:47 Asche, [name] ([id number]) -------------------------------------------------------------------------------- Progress Note Details [name], [name] 05/29/2015 8:00 Date of Service: AM Medical Record [id number] Number: Patient Account Number: [id number] 07-01-1956 (59 y.o. Treating RN: [name] Date of Birth/Sex: Female) Other Clinician: Primary Care Physician: [name] Treating [name] Referring Physician: Steele [name] Physician/Extender: Suella Grove in Treatment: 22 Subjective Chief Complaint Information obtained from Patient Patient presents to the wound care center for a consult due non healing wound. She has an open wound on her right upper back which she's had for about a year and she recently noticed a blister on her right lower extremity about 2 weeks ago. History of Present Illness (HPI) The following HPI elements were documented for the patient's wound: Location: right upper back and right lower extremity wounds Quality: Patient reports No Pain. Severity: Patient states wound (s) are getting better. Duration: Patient has had the wound for > 3 months prior to seeking treatment at the wound center Timing: she thought it first  occurred when she was using a heating pad about a year ago after back surgery. Context: The wound appeared gradually over time Modifying Factors: Patient is currently on renal dialysis and receives treatments 3 times weekly Associated Signs and Symptoms: Patient reports having: surgery scheduled for this week for a AV fistula left arm. 59 year old patient who is known to be a diabetic and has end-stage renal disease has had several comorbidities including coronary artery disease, hypertension, hyperlipidemia, pancreatitis, anemia, previous history of hysterectomy, cholecystectomy, left-sided salivary gland excision, bilateral cataract surgery,Peritoneal dialysis catheter, hemodialysis catheter. the area on the back has also been caused by instant pressure she used to sleep on a recliner all day and has significant kyphoscoliosis. As far as the wound on her right lower extremity she's not sure how this blister occurred but she thought it has been there for about 2 weeks. No recent blood investigations available and no recent hemoglobin A1c. 12/30/2014 -- she is an assisted living facility but I believe the nurses that have not followed instructions as she had some cream applied on her back and there was a different dressing. Last week she's had a AV fistula placed on her left forearm. 01/13/2015 -- she has had some localized infection at the port site and she's been on doxycycline for this. 02/05/2015 - he has developed a small blister on her right lower extremity. 02/10/2015 -- she has developed another small blister on her right anterior chest wall in the area where she's had tape for her dialysis access. this may just be injury caused by a tape burn. [name], [name] ([id number]) 02/18/2015 -- no new blisters and she had a dermatology opinion and they have taken a biopsy of her skin. She also had a left brachial AV fistula placed this week. 03/03/2015 -- though we do not have the pathology  report yet the patient says she has been put on prednisone because this skin disease is possibly to do with her immune system and her dermatologist is recommended this. 03/10/2015 -- she has developed a new blister which is quite large on her right lower extremity on the shin. 05/22/2015 -- she did very well with her dressing but on trying to remove the [place] it peels away the newly healed skin. Objective Constitutional Pulse regular. Respirations normal and unlabored. Afebrile. Vitals Time Taken: 8:15 AM, Height: 65 in, Weight: 156 lbs, BMI: 26, Temperature: 97.7 F,  Pulse: 84 bpm, Respiratory Rate: 18 breaths/min, Blood Pressure: 137/65 mmHg. Eyes Nonicteric. Reactive to light. Ears, Nose, Mouth, and Throat Lips, teeth, and gums WNL.Marland Kitchen Moist mucosa without lesions . Neck supple and nontender. No palpable supraclavicular or cervical adenopathy. Normal sized without goiter. Respiratory WNL. No retractions.. Cardiovascular Pedal Pulses WNL. No clubbing, cyanosis or edema. Chest Breasts symmetical and no nipple discharge.. Breast tissue WNL, no masses, lumps, or tenderness.. Lymphatic No adneopathy. No adenopathy. No adenopathy. Musculoskeletal Adexa without tenderness or enlargement.. Digits and nails w/o clubbing, cyanosis, infection, petechiae, ischemia, or inflammatory conditions.[name] ([id number]) Psychiatric Judgement and insight Intact.. No evidence of depression, anxiety, or agitation.. General Notes: most of the wound is healed except for an area in the center. She has a blister on the right leg which is superficial. Integumentary (Hair, Skin) No suspicious lesions. No crepitus or fluctuance. No peri-wound warmth or erythema. No masses.. Wound #1 status is Open. Original cause of wound was Blister. The wound is located on the Right Back. The wound measures 3cm length x 3.3cm width x 0.1cm depth; 7.775cm^2 area and 0.778cm^3 volume. The wound is limited  to skin breakdown. There is a medium amount of serosanguineous drainage noted. The wound margin is indistinct and nonvisible. There is small (1-33%) red granulation within the wound bed. There is no necrotic tissue within the wound bed. The periwound skin appearance exhibited: Moist. The periwound skin appearance did not exhibit: Callus, Crepitus, Excoriation, Fluctuance, Friable, Induration, Localized Edema, Rash, Scarring, Dry/Scaly, Maceration, Atrophie Blanche, Cyanosis, Ecchymosis, Hemosiderin Staining, Mottled, Pallor, Rubor, Erythema. Wound #6 status is Open. Original cause of wound was Blister. The wound is located on the Right,Midline Lower Leg. The wound measures 2cm length x 1.2cm width x 0.1cm depth; 1.885cm^2 area and 0.188cm^3 volume. The wound is limited to skin breakdown. There is a small amount of serous drainage noted. The wound margin is flat and intact. There is large (67-100%) red granulation within the wound bed. There is no necrotic tissue within the wound bed. The periwound skin appearance did not exhibit: Callus, Crepitus, Excoriation, Fluctuance, Friable, Induration, Localized Edema, Rash, Scarring, Dry/Scaly, Maceration, Moist, Atrophie Blanche, Cyanosis, Ecchymosis, Hemosiderin Staining, Mottled, Pallor, Rubor, Erythema. Assessment Active Problems ICD-10 E11.622 - Type 2 diabetes mellitus with other skin ulcer L89.112 - Pressure ulcer of right upper back, stage 2 S80.821A - Blister (nonthermal), right lower leg, initial encounter N18.6 - End stage renal disease We will continue to use Hydoactive B on the back and a Mepitel light on her right lower extremity. Offloading has been discussed with her in great detail and she will be compliant. [name], [name] ([id number]) Plan Wound Cleansing: Wound #1 Right Back: Clean wound with Normal Saline. Primary Wound Dressing: Wound #1 Right Back: Other: - Sorbact Hydroactive B; If must be changed: use Vaseline gauze and  foam dressing. Wound #6 Right,Midline Lower Leg: Other: - mepitel lite Dressing Change Frequency: Wound #1 Right Back: Other: - SNF RN may change if needed for drainage - Follow-up Appointments: Wound #1 Right Back: Return Appointment in 1 week. We will continue to use Hydoactive B on the back and a Mepitel light on her right lower extremity. Offloading has been discussed with her in great detail and she will be compliant. Electronic Signature(s) Signed: 05/29/2015 4:14:25 PM By: [name] Fudge MD, [name] Previous Signature: 05/29/2015 8:37:10 AM Version By: [name] Fudge MD, [name] Entered By: [name] on 05/29/2015 16:14:25 Karpf, [name] ([id number]) -------------------------------------------------------------------------------- SuperBill Details Patient Name: [name] Hudson  S. Date of Service: 05/29/2015 Medical Record Number: [id number] Patient Account Number: [id number] Date of Birth/Sex: 04-Aug-1955 (59 y.o. Female) Treating RN: [name] Primary Care Physician: [name] Other Clinician: Referring Physician: Steele [name] Treating Physician/Extender: Frann Rider in Treatment: 22 Diagnosis Coding ICD-10 Codes Code Description E11.622 Type 2 diabetes mellitus with other skin ulcer L89.112 Pressure ulcer of right upper back, stage 2 S80.821A Blister (nonthermal), right lower leg, initial encounter N18.6 End stage renal disease Facility Procedures CPT4 Code: [id number] Description: [phone] - WOUND CARE VISIT-LEV 2 EST PT Modifier: Quantity: 1 Physician Procedures CPT4 Code: [id number] Description: [id number] - WC PHYS LEVEL 3 - EST PT ICD-10 Description Diagnosis E11.622 Type 2 diabetes mellitus with other skin ulcer S80.821A Blister (nonthermal), right lower leg, initial enc L89.112 Pressure ulcer of right upper back, stage 2 N18.6 End  stage renal disease Modifier: ounter Quantity: 1 Electronic Signature(s) Signed: 05/29/2015 4:13:02 PM By: [name] Fudge MD,  [name] Signed: 05/29/2015 4:39:51 PM By: Gretta Cool RN, [name], Kim RN, [name] Previous Signature: 05/29/2015 8:37:24 AM Version By: [name] Fudge MD, [name] Entered By: Gretta Cool RN, [name], Kim on 05/29/2015 08:42:37

## 2015-06-01 ENCOUNTER — Emergency Department
Admission: EM | Admit: 2015-06-01 | Discharge: 2015-06-01 | Disposition: A | Discharge status: Home / Self Care | Payer: Medicare Other | Attending: Emergency Medicine | Admitting: Emergency Medicine

## 2015-06-01 ENCOUNTER — Encounter: Payer: Self-pay | Admitting: Emergency Medicine

## 2015-06-01 DIAGNOSIS — Z992 Dependence on renal dialysis: Secondary | ICD-10-CM | POA: Diagnosis not present

## 2015-06-01 DIAGNOSIS — Z7952 Long term (current) use of systemic steroids: Secondary | ICD-10-CM | POA: Diagnosis not present

## 2015-06-01 DIAGNOSIS — E1142 Type 2 diabetes mellitus with diabetic polyneuropathy: Secondary | ICD-10-CM | POA: Insufficient documentation

## 2015-06-01 DIAGNOSIS — Z7982 Long term (current) use of aspirin: Secondary | ICD-10-CM | POA: Insufficient documentation

## 2015-06-01 DIAGNOSIS — Z794 Long term (current) use of insulin: Secondary | ICD-10-CM | POA: Diagnosis not present

## 2015-06-01 DIAGNOSIS — Z7951 Long term (current) use of inhaled steroids: Secondary | ICD-10-CM | POA: Insufficient documentation

## 2015-06-01 DIAGNOSIS — R040 Epistaxis: Secondary | ICD-10-CM | POA: Diagnosis present

## 2015-06-01 DIAGNOSIS — I120 Hypertensive chronic kidney disease with stage 5 chronic kidney disease or end stage renal disease: Secondary | ICD-10-CM | POA: Insufficient documentation

## 2015-06-01 DIAGNOSIS — Z7902 Long term (current) use of antithrombotics/antiplatelets: Secondary | ICD-10-CM | POA: Insufficient documentation

## 2015-06-01 DIAGNOSIS — Z79899 Other long term (current) drug therapy: Secondary | ICD-10-CM | POA: Insufficient documentation

## 2015-06-01 DIAGNOSIS — N186 End stage renal disease: Secondary | ICD-10-CM | POA: Insufficient documentation

## 2015-06-01 LAB — CBC WITH DIFFERENTIAL/PLATELET
BASOS PCT: 1 %
Basophils Absolute: 0 10*3/uL (ref 0–0.1)
EOS ABS: 0.1 10*3/uL (ref 0–0.7)
Eosinophils Relative: 2 %
HEMATOCRIT: 37.2 % (ref 35.0–47.0)
Hemoglobin: 11.8 g/dL — ABNORMAL LOW (ref 12.0–16.0)
LYMPHS ABS: 1.3 10*3/uL (ref 1.0–3.6)
Lymphocytes Relative: 18 %
MCH: 29.6 pg (ref 26.0–34.0)
MCHC: 31.8 g/dL — ABNORMAL LOW (ref 32.0–36.0)
MCV: 93.1 fL (ref 80.0–100.0)
MONO ABS: 0.4 10*3/uL (ref 0.2–0.9)
MONOS PCT: 6 %
Neutro Abs: 5.1 10*3/uL (ref 1.4–6.5)
Neutrophils Relative %: 73 %
Platelets: 213 10*3/uL (ref 150–440)
RBC: 3.99 MIL/uL (ref 3.80–5.20)
RDW: 15.5 % — AB (ref 11.5–14.5)
WBC: 6.9 10*3/uL (ref 3.6–11.0)

## 2015-06-01 MED ORDER — OXYMETAZOLINE HCL 0.05 % NA SOLN
1.0000 | Freq: Once | NASAL | Status: AC
  Administered 2015-06-01: 1 via NASAL
  Filled 2015-06-01: qty 15

## 2015-06-01 MED ORDER — BACITRACIN ZINC 500 UNIT/GM EX OINT
TOPICAL_OINTMENT | CUTANEOUS | Status: AC
  Administered 2015-06-01: 1
  Filled 2015-06-01: qty 0.9

## 2015-06-01 NOTE — ED Provider Notes (Signed)
Time Seen: Approximately 1841 I have reviewed the triage notes  Chief Complaint: Epistaxis   History of Present Illness: [name] is a 59 y.o. female who presents with right-sided nosebleed that she's had intermittently for the last 24 hours. Patient has a long history of hypertension arrives at 230/82. She states she hasn't had her normal blood pressure medication that she takes in the afternoon. Patient states that the bleeding seems to stop intermittently and then will return. She had a nasal packing inserted by the nursing staff at the facility of [facility]. He states that she Bleeding so they transferred her here to the emergency department for evaluation. Past Medical History  Diagnosis Date  . Diabetes ([place])   . COPD (chronic obstructive pulmonary disease) ([place])   . CKD (chronic kidney disease)   . CAD (coronary artery disease)   . HTN (hypertension)   . MI (myocardial infarction) ([place]) 05-17-2014  . CHF (congestive heart failure) ([place])   . Atrial fibrillation ([place])   . GERD (gastroesophageal reflux disease)   . Pancreatitis   . Peripheral autonomic neuropathy due to diabetes mellitus ([place])   . Diabetic peripheral neuropathy associated with type 2 diabetes mellitus ([place])   . Dialysis patient [facility])     Patient Active Problem List   Diagnosis Date Noted  . Acute coronary syndrome ([place]) 05/20/2014    Past Surgical History  Procedure Laterality Date  . Left heart catheterization with coronary angiogram N/A 05/20/2014    Procedure: LEFT HEART CATHETERIZATION WITH CORONARY ANGIOGRAM;  Surgeon: Clent Demark, MD;  Location: [facility] CATH LAB;  Service: Cardiovascular;  Laterality: N/A;  . Coronary angioplasty      Cardiac stents  . Back surgery    . Eye surgery      bilateral cataract extractions  . Abdominal hysterectomy    . Cholecystectomy    . Portacath placement    . Av fistula placement Left 12/25/2014    Procedure: ARTERIOVENOUS (AV) FISTULA CREATION;   Surgeon: Algernon Huxley, MD;  Location: [facility] ORS;  Service: Vascular;  Laterality: Left;  . Peripheral vascular catheterization N/A 02/12/2015    Procedure: A/V Shuntogram/Fistulagram;  Surgeon: Algernon Huxley, MD;  Location: [place] CV LAB;  Service: Cardiovascular;  Laterality: N/A;  . Peripheral vascular catheterization N/A 02/12/2015    Procedure: A/V Shunt Intervention;  Surgeon: Algernon Huxley, MD;  Location: [place] CV LAB;  Service: Cardiovascular;  Laterality: N/A;  . Peripheral vascular catheterization N/A 04/09/2015    Procedure: Dialysis/Perma Catheter Removal;  Surgeon: Algernon Huxley, MD;  Location: [place] CV LAB;  Service: Cardiovascular;  Laterality: N/A;    Past Surgical History  Procedure Laterality Date  . Left heart catheterization with coronary angiogram N/A 05/20/2014    Procedure: LEFT HEART CATHETERIZATION WITH CORONARY ANGIOGRAM;  Surgeon: Clent Demark, MD;  Location: [facility] CATH LAB;  Service: Cardiovascular;  Laterality: N/A;  . Coronary angioplasty      Cardiac stents  . Back surgery    . Eye surgery      bilateral cataract extractions  . Abdominal hysterectomy    . Cholecystectomy    . Portacath placement    . Av fistula placement Left 12/25/2014    Procedure: ARTERIOVENOUS (AV) FISTULA CREATION;  Surgeon: Algernon Huxley, MD;  Location: [facility] ORS;  Service: Vascular;  Laterality: Left;  . Peripheral vascular catheterization N/A 02/12/2015    Procedure: A/V Shuntogram/Fistulagram;  Surgeon: Algernon Huxley, MD;  Location:  [place] CV LAB;  Service: Cardiovascular;  Laterality: N/A;  . Peripheral vascular catheterization N/A 02/12/2015    Procedure: A/V Shunt Intervention;  Surgeon: Algernon Huxley, MD;  Location: [place] CV LAB;  Service: Cardiovascular;  Laterality: N/A;  . Peripheral vascular catheterization N/A 04/09/2015    Procedure: Dialysis/Perma Catheter Removal;  Surgeon: Algernon Huxley, MD;  Location: [place] CV LAB;  Service: Cardiovascular;   Laterality: N/A;    Current Outpatient Rx  Name  Route  Sig  Dispense  Refill  . acetaminophen (TYLENOL) 325 MG tablet   Oral   Take 650 mg by mouth every 6 (six) hours as needed for mild pain.          Marland Kitchen aspirin EC 81 MG tablet   Oral   Take 81 mg by mouth daily.         Marland Kitchen atorvastatin (LIPITOR) 20 MG tablet   Oral   Take 20 mg by mouth every evening.          . baclofen (LIORESAL) 10 MG tablet   Oral   Take 5 mg by mouth at bedtime.         . calcium acetate (PHOSLO) 667 MG capsule   Oral   Take 1,334 mg by mouth daily.         . Calcium Carbonate-Vitamin D (CALCIUM 600+D) 600-400 MG-UNIT tablet   Oral   Take 1 tablet by mouth daily.         . carvedilol (COREG) 6.25 MG tablet   Oral   Take 6.25 mg by mouth 2 (two) times daily.          . clobetasol cream (TEMOVATE) 0.05 %   Topical   Apply 1 application topically 2 (two) times daily as needed (for itching).         . clobetasol ointment (TEMOVATE) 0.05 %   Topical   Apply 1 application topically 2 (two) times daily.         . clopidogrel (PLAVIX) 75 MG tablet   Oral   Take 75 mg by mouth every evening.          . docusate sodium (COLACE) 100 MG capsule   Oral   Take 100 mg by mouth daily.          . famotidine (PEPCID) 20 MG tablet   Oral   Take 20 mg by mouth every evening.         . fluticasone (FLONASE) 50 MCG/ACT nasal spray   Each Nare   Place 1 spray into both nostrils daily.         . furosemide (LASIX) 40 MG tablet   Oral   Take 40 mg by mouth every Monday, Wednesday, and Friday.         . furosemide (LASIX) 80 MG tablet   Oral   Take 80 mg by mouth every Tuesday, Thursday, Saturday, and Sunday.         . hydrALAZINE (APRESOLINE) 50 MG tablet   Oral   Take 50 mg by mouth 2 (two) times daily.         Marland Kitchen HYDROcodone-acetaminophen (NORCO) 5-325 MG per tablet   Oral   Take 1 tablet by mouth every 6 (six) hours as needed for moderate pain.   30 tablet   0    . insulin glargine (LANTUS) 100 UNIT/ML injection   Subcutaneous   Inject 35 Units into the skin at bedtime.          Marland Kitchen  insulin lispro (HUMALOG) 100 UNIT/ML injection   Subcutaneous   Inject 4-10 Units into the skin daily. Pt uses per sliding scale:    0-200:  No insulin  201-250:  4 units  251-300:  6 units  301-350:  8 units  351-400:  10 units Greater than 400:  Call MD         . isosorbide dinitrate (ISORDIL) 30 MG tablet   Oral   Take 30 mg by mouth daily.          Marland Kitchen levothyroxine (SYNTHROID, LEVOTHROID) 50 MCG tablet   Oral   Take 50 mcg by mouth at bedtime.          Marland Kitchen losartan (COZAAR) 50 MG tablet   Oral   Take 50 mg by mouth 2 (two) times daily.          . magnesium oxide (MAG-OX) 400 MG tablet   Oral   Take 400 mg by mouth every evening.          . meclizine (ANTIVERT) 25 MG tablet   Oral   Take 25 mg by mouth every 6 (six) hours as needed for dizziness or nausea.          . metoCLOPramide (REGLAN) 10 MG tablet   Oral   Take 10 mg by mouth at bedtime.         . multivitamin (RENA-VIT) TABS tablet   Oral   Take 1 tablet by mouth daily.         . mycophenolate (CELLCEPT) 500 MG tablet   Oral   Take 500 mg by mouth 2 (two) times daily.         . polyethylene glycol (MIRALAX / GLYCOLAX) packet   Oral   Take 17 g by mouth daily as needed for mild constipation.         . predniSONE (DELTASONE) 10 MG tablet   Oral   Take 10 mg by mouth daily.         . pregabalin (LYRICA) 25 MG capsule   Oral   Take 25 mg by mouth 2 (two) times daily.         . pregabalin (LYRICA) 50 MG capsule   Oral   Take 50 mg by mouth at bedtime.         Marland Kitchen rOPINIRole (REQUIP) 0.25 MG tablet   Oral   Take 0.25 mg by mouth See admin instructions. Pt takes one tablet once a day on Sunday, Tuesday, Thursday, and Saturday.   Pt takes one tablet twice a day on Monday, Wednesday, and Friday.           Allergies:  Adhesive; Shellfish allergy; and  Doxycycline  Family History: Family History  Problem Relation Age of Onset  . Diabetes Mother   . Hypertension Mother     Social History: Social History  Substance Use Topics  . Smoking status: Never Smoker   . Smokeless tobacco: Never Used  . Alcohol Use: No     Review of Systems:   10 point review of systems was performed and was otherwise negative:  Constitutional: No fever Eyes: No visual disturbances ENT: No sore throat, ear pain Cardiac: No chest pain Respiratory: No shortness of breath, wheezing, or stridor Abdomen: No abdominal pain, no vomiting, No diarrhea Endocrine: No weight loss, No night sweats Extremities: No peripheral edema, cyanosis Skin: No rashes, easy bruising Neurologic: No focal weakness, trouble with speech or swollowing Urologic: No dysuria, Hematuria, or urinary frequency  Physical Exam:  ED Triage Vitals  Enc Vitals Group     BP 06/01/15 1815 230/82 mmHg     Pulse Rate 06/01/15 1815 85     Resp 06/01/15 1815 20     Temp 06/01/15 1815 97.6 F (36.4 C)     Temp Source 06/01/15 1815 Oral     SpO2 06/01/15 1815 98 %     Weight 06/01/15 1815 179 lb (81.194 kg)     Height 06/01/15 1815 5\' 3"  (1.6 m)     Head Cir --      Peak Flow --      Pain Score --      Pain Loc --      Pain Edu? --      Excl. in [place]? --     General: Awake , Alert , and Oriented times 3; GCS 15 Head: Normal cephalic , atraumatic Eyes: Pupils equal , round, reactive to light Nose/Throat: Patient's exam shows no bleeding from the left nares. She does have area was identified over the septal region anteriorly as the cause of her bleeding. No evidence of posterior bleeding.  Neck: Supple, Full range of motion, No anterior adenopathy or palpable thyroid masses Lungs: Clear to ascultation without wheezes , rhonchi, or rales Heart: Regular rate, regular rhythm without murmurs , gallops , or rubs Abdomen: Soft, non tender without rebound, guarding , or rigidity; bowel  sounds positive and symmetric in all 4 quadrants. No organomegaly .        Extremities: 2 plus symmetric pulses. No edema, clubbing or cyanosis Neurologic: normal ambulation, Motor symmetric without deficits, sensory intact Skin: warm, dry, no rashes   Labs:   All laboratory work was reviewed including any pertinent negatives or positives listed below:  Labs Reviewed  CBC WITH DIFFERENTIAL/PLATELET - Abnormal; Notable for the following:    Hemoglobin 11.8 (*)    MCHC 31.8 (*)    RDW 15.5 (*)    All other components within normal limits   laboratory work was reviewed showed no significant findings  Procedure: Right-sided nasal packing. Patient had Afrin nasal spray applied and then had a Murocel dressing inserted with Neosporin coated. Patient then had inflation of the packing with Afrin nasal spray. She was observed here for a period of time and had some mild drainage of blood but otherwise seemed to stabilize with no active bleeding at this point. Her blood pressure remained elevated but she states is "" always elevated "". I felt her nosebleed was not secondary to hypertension. She was referred to your nose and throat unassigned  ED Course:  As stated above patient was observed here and transported by BLS unit back to the nursing facility    Assessment Right-sided epistaxis Right-sided nasal packing   Final Clinical Impression: * Final diagnoses:  Anterior epistaxis     Plan:  Outpatient management Patient was advised to return immediately if condition worsens. Patient was advised to follow up with her primary care physician or other specialized physicians involved and in their current assessment.             Daymon Larsen, MD 06/01/15 [phone]

## 2015-06-01 NOTE — Discharge Instructions (Signed)

## 2015-06-01 NOTE — ED Notes (Signed)
States nurse at [facility] packed nare but no improvement

## 2015-06-02 NOTE — Progress Notes (Signed)
MADALIE, AVANS ([id number]) Visit Report for 05/29/2015 Arrival Information Details [name], [name]. Date of Service: 05/29/2015 8:00 AM Medical Record Number: [id number] Patient Account Number: [id number] Date of Birth/Sex: [birth year] (59 y.o. Female) Treating RN: Cornell Barman Primary Care Physician: Steele Sizer Other Clinician: Referring Physician: Steele Sizer Treating Physician/Extender: Frann Rider in Treatment: 77 Visit Information History Since Last Visit Added or deleted any medications: No Patient Arrived: [name] Any new allergies or adverse reactions: No Arrival Time: 08:15 Had a fall or experienced change in No Accompanied By: transport activities of daily living that may affect Transfer Assistance: None risk of falls: Patient Identification Verified: Yes Signs or symptoms of abuse/neglect since last No Secondary Verification Process Yes visito Completed: Hospitalized since last visit: No Patient Requires Transmission- No Has Dressing in Place as Prescribed: Yes Based Precautions: Pain Present Now: No Patient Has Alerts: Yes Patient Alerts: Patient on Blood Thinner ABI R: 1.28 Electronic Signature(s) Signed: 05/29/2015 4:39:51 PM By: Gretta Cool, RN, [name], Kim RN, [name] Entered By: Gretta Cool, RN, [name], Kim on 05/29/2015 12:45:02 Lips, [name] ([id number]) -------------------------------------------------------------------------------- Clinic Level of Care Assessment Details [name]. Date of Service: 05/29/2015 8:00 AM Medical Record Number: [id number] Patient Account Number: [id number] Date of Birth/Sex: [birth year] (59 y.o. Female) Treating RN: Cornell Barman Primary Care Physician: Steele Sizer Other Clinician: Referring Physician: Steele Sizer Treating Physician/Extender: Frann Rider in Treatment: 22 Clinic Level of Care Assessment Items TOOL 1 Quantity Score []  - Use when EandM and Procedure is performed on  INITIAL visit 0 ASSESSMENTS - Nursing Assessment / Reassessment []  - General Physical Exam (combine w/ comprehensive assessment (listed just 0 below) when performed on new pt. evals) []  - Comprehensive Assessment (HX, ROS, Risk Assessments, Wounds Hx, etc.) 0 ASSESSMENTS - Wound and Skin Assessment / Reassessment []  - Dermatologic / Skin Assessment (not related to wound area) 0 ASSESSMENTS - Ostomy and/or Continence Assessment and Care []  - Incontinence Assessment and Management 0 []  - Ostomy Care Assessment and Management (repouching, etc.) 0 PROCESS - Coordination of Care []  - Simple Patient / Family Education for ongoing care 0 []  - Complex (extensive) Patient / Family Education for ongoing care 0 []  - Staff obtains Programmer, systems, Records, Test Results / Process Orders 0 []  - Staff telephones HHA, Nursing Homes / Clarify orders / etc 0 []  - Routine Transfer to another Facility (non-emergent condition) 0 []  - Routine Hospital Admission (non-emergent condition) 0 []  - New Admissions / Biomedical engineer / Ordering NPWT, Apligraf, etc. 0 []  - Emergency Hospital Admission (emergent condition) 0 PROCESS - Special Needs []  - Pediatric / Minor Patient Management 0 []  - Isolation Patient Management 0 Weinberg, Elyana S. ([id number]) []  - Hearing / Language / Visual special needs 0 []  - Assessment of Community assistance (transportation, D/C planning, etc.) 0 []  - Additional assistance / Altered mentation 0 []  - Support Surface(s) Assessment (bed, cushion, seat, etc.) 0 INTERVENTIONS - Miscellaneous []  - External ear exam 0 []  - Patient Transfer (multiple staff / Civil Service fast streamer / Similar devices) 0 []  - Simple Staple / Suture removal (25 or less) 0 []  - Complex Staple / Suture removal (26 or more) 0 []  - Hypo/Hyperglycemic Management (do not check if billed separately) 0 []  - Ankle / Brachial Index (ABI) - do not check if billed separately 0 Has the patient been seen at the hospital within the  last three years: Yes Total Score: 0 Level Of Care: ____ Electronic Signature(s) Signed: 05/29/2015 4:39:51  PM By: Gretta Cool, RN, [name], Kim RN, [name] Entered By: Gretta Cool, RN, [name], Kim on 05/29/2015 08:42:25 [place]embridge, [name] ([id number]) -------------------------------------------------------------------------------- Encounter Discharge Information Details [name]. Date of Service: 05/29/2015 8:00 AM Medical Record Number: [id number] Patient Account Number: [id number] Date of Birth/Sex: Dec 10, 1955 (59 y.o. Female) Treating RN: Cornell Barman Primary Care Physician: Steele Sizer Other Clinician: Referring Physician: Steele Sizer Treating Physician/Extender: Frann Rider in Treatment: 22 Encounter Discharge Information Items Discharge Pain Level: 0 Discharge Condition: Stable Ambulatory Status: Walker Discharge Destination: Home Transportation: Private Auto Accompanied By: self Schedule Follow-up Appointment: Yes Medication Reconciliation completed and provided to Patient/Care Yes Zeniah Briney: Provided on Clinical Summary of Care: 05/29/2015 Form Type Recipient Paper Patient LD Electronic Signature(s) Signed: 05/29/2015 8:48:02 AM By: Ruthine Dose Entered By: Ruthine Dose on 05/29/2015 08:48:02 Pinela, [name] ([id number]) -------------------------------------------------------------------------------- Lower Extremity Assessment Details Patient Name: [name], [name] Date of Service: 05/29/2015 8:00 AM Medical Record Number: [id number] Patient Account Number: [id number] Date of Birth/Sex: 1956/05/14 (59 y.o. Female) Treating RN: Cornell Barman Primary Care Physician: Steele Sizer Other Clinician: Referring Physician: Steele Sizer Treating Physician/Extender: Frann Rider in Treatment: 22 Vascular Assessment Pulses: Posterior Tibial Dorsalis Pedis Palpable: [Right:Yes] Extremity colors, hair growth, and conditions: Extremity Color:  [Right:Normal] Hair Growth on Extremity: [Right:No] Temperature of Extremity: [Right:Warm] Capillary Refill: [Right:< 3 seconds] Toe Nail Assessment Left: Right: Thick: No Discolored: No Deformed: No Improper Length and Hygiene: No Electronic Signature(s) Signed: 05/29/2015 4:39:51 PM By: Gretta Cool, RN, [name], Kim RN, [name] Entered By: Gretta Cool, RN, [name], Kim on 05/29/2015 08:41:42 Albertsen, [name] ([id number]) -------------------------------------------------------------------------------- Multi Wound Chart Details [name]. Date of Service: 05/29/2015 8:00 AM Medical Record Number: [id number] Patient Account Number: [id number] Date of Birth/Sex: 06-28-1956 (59 y.o. Female) Treating RN: Cornell Barman Primary Care Physician: Steele Sizer Other Clinician: Referring Physician: Steele Sizer Treating Physician/Extender: Frann Rider in Treatment: 22 Vital Signs Height(in): 65 Pulse(bpm): 84 Weight(lbs): 156 Blood Pressure 137/65 (mmHg): Body Mass Index(BMI): 26 Temperature(F): 97.7 Respiratory Rate 18 (breaths/min): Photos: [1:No Photos] [6:No Photos] [N/A:N/A] Wound Location: [1:Right Back] [6:Right Lower Leg - Midline N/A] Wounding Event: [1:Blister] [6:Blister] [N/A:N/A] Primary Etiology: [1:Pressure Ulcer] [6:Venous Leg Ulcer] [N/A:N/A] Comorbid History: [1:Glaucoma, Anemia, Coronary Artery Disease, Coronary Artery Disease, Hypertension, Type II Diabetes, End Stage Renal Disease, Osteoarthritis, Neuropathy Osteoarthritis, Neuropathy] [6:Glaucoma, Anemia, Hypertension, Type II Diabetes,  End Stage Renal Disease,] [N/A:N/A] Date Acquired: [1:10/19/2013] [6:05/22/2015] [N/A:N/A] Weeks of Treatment: [1:22] [6:0] [N/A:N/A] Wound Status: [1:Open] [6:Open] [N/A:N/A] Measurements L x W x D 3x3.3x0.1 [6:2x1.2x0.1] [N/A:N/A] (cm) Area (cm) : [1:7.775] [6:1.885] [N/A:N/A] Volume (cm) : [1:0.778] [6:0.188] [N/A:N/A] % Reduction in Area: [1:79.80%] [6:N/A]  [N/A:N/A] % Reduction in Volume: 79.80% [6:N/A] [N/A:N/A] Classification: [1:Category/Stage II] [6:Partial Thickness] [N/A:N/A] HBO Classification: [1:N/A] [6:Grade 1] [N/A:N/A] Exudate Amount: [1:Medium] [6:Small] [N/A:N/A] Exudate Type: [1:Serosanguineous] [6:Serous] [N/A:N/A] Exudate Color: [1:red, brown] [6:amber] [N/A:N/A] Wound Margin: [1:Indistinct, nonvisible] [6:Flat and Intact] [N/A:N/A] Granulation Amount: [1:Small (1-33%)] [6:Large (67-100%)] [N/A:N/A] Granulation Quality: [1:Red] [6:Red] [N/A:N/A] Necrotic Amount: [1:None Present (0%)] [6:None Present (0%)] [N/A:N/A] Exposed Structures: [1:Fascia: No Fat: No] [6:Fascia: No Fat: No] [N/A:N/A] Tendon: No Tendon: No Muscle: No Muscle: No Joint: No Joint: No Bone: No Bone: No Limited to Skin Limited to Skin Breakdown Breakdown Periwound Skin Texture: Edema: No Edema: No N/A Excoriation: No Excoriation: No Induration: No Induration: No Callus: No Callus: No Crepitus: No Crepitus: No Fluctuance: No Fluctuance: No Friable: No Friable: No Rash: No Rash: No Scarring: No Scarring: No Periwound Skin Moist:  Yes Maceration: No N/A Moisture: Maceration: No Moist: No Dry/Scaly: No Dry/Scaly: No Periwound Skin Color: Atrophie Blanche: No Atrophie Blanche: No N/A Cyanosis: No Cyanosis: No Ecchymosis: No Ecchymosis: No Erythema: No Erythema: No Hemosiderin Staining: No Hemosiderin Staining: No Mottled: No Mottled: No Pallor: No Pallor: No Rubor: No Rubor: No Tenderness on No No N/A Palpation: Wound Preparation: Ulcer Cleansing: Ulcer Cleansing: N/A Rinsed/Irrigated with Rinsed/Irrigated with Saline Saline Topical Anesthetic Topical Anesthetic Applied: None Applied: Other: lidociane 4% Treatment Notes Electronic Signature(s) Signed: 05/29/2015 4:39:51 PM By: Gretta Cool, RN, [name], Kim RN, [name] Entered By: Gretta Cool, RN, [name], Kim on 05/29/2015 08:42:07 Buis, [name]  ([id number]) -------------------------------------------------------------------------------- Elizabeth Details Patient Name: DESARIE, MAZMANIAN. Date of Service: 05/29/2015 8:00 AM Medical Record Number: [id number] Patient Account Number: [id number] Date of Birth/Sex: [birth year] (59 y.o. Female) Treating RN: Cornell Barman Primary Care Physician: Steele Sizer Other Clinician: Referring Physician: Steele Sizer Treating Physician/Extender: Frann Rider in Treatment: 40 Active Inactive Abuse / Safety / Falls / Self Care Management Nursing Diagnoses: Potential for falls Goals: Patient will remain injury free Date Initiated: 12/23/2014 Goal Status: Active Patient/caregiver will verbalize understanding of skin care regimen Date Initiated: 12/23/2014 Goal Status: Active Patient/caregiver will verbalize/demonstrate measures taken to prevent injury and/or falls Date Initiated: 12/23/2014 Goal Status: Active Patient/caregiver will verbalize/demonstrate understanding of what to do in case of emergency Date Initiated: 12/23/2014 Goal Status: Active Interventions: Assess fall risk on admission and as needed Assess: immobility, friction, shearing, incontinence upon admission and as needed Assess impairment of mobility on admission and as needed per policy Assess self care needs on admission and as needed Provide education on fall prevention Notes: Wound/Skin Impairment Nursing Diagnoses: Impaired tissue integrity Knowledge deficit related to ulceration/compromised skin integrity Goals: Patient/caregiver will verbalize understanding of skin care regimen KEYLEI, FIZER ([id number]) Date Initiated: 12/23/2014 Goal Status: Active Ulcer/skin breakdown will heal within 14 weeks Date Initiated: 12/23/2014 Goal Status: Active Interventions: Assess patient/caregiver ability to obtain necessary supplies Assess patient/caregiver ability to perform ulcer/skin care regimen upon  admission and as needed Assess ulceration(s) every visit Provide education on ulcer and skin care Treatment Activities: Skin care regimen initiated : 05/29/2015 Topical wound management initiated : 05/29/2015 Notes: Electronic Signature(s) Signed: 05/29/2015 4:39:51 PM By: Gretta Cool, RN, [name], Kim RN, [name] Entered By: Gretta Cool, RN, [name], Kim on 05/29/2015 08:41:56 Ogletree, [name] ([id number]) -------------------------------------------------------------------------------- Pain Assessment Details [name]. Date of Service: 05/29/2015 8:00 AM Medical Record Number: [id number] Patient Account Number: [id number] Date of Birth/Sex: [birth year] (59 y.o. Female) Treating RN: Cornell Barman Primary Care Physician: Steele Sizer Other Clinician: Referring Physician: Steele Sizer Treating Physician/Extender: Frann Rider in Treatment: 22 Active Problems Location of Pain Severity and Description of Pain Patient Has Paino Yes Site Locations Rate the pain. Current Pain Level: 2 Character of Pain Describe the Pain: Burning Pain Management and Medication Current Pain Management: Electronic Signature(s) Signed: 05/29/2015 4:39:51 PM By: Gretta Cool, RN, [name], Kim RN, [name] Entered By: Gretta Cool, RN, [name], Kim on 05/29/2015 08:41:31 Delia, [name] ([id number]) -------------------------------------------------------------------------------- Patient/Caregiver Education Details [name]. Date of Service: 05/29/2015 8:00 AM Medical Record Number: [id number] Patient Account Number: [id number] Date of Birth/Gender: [birth year] (59 y.o. Female) Treating RN: Cornell Barman Primary Care Physician: Steele Sizer Other Clinician: Referring Physician: Steele Sizer Treating Physician/Extender: Frann Rider in Treatment: 22 Education Assessment Education Provided To: Patient Education Topics Provided Wound/Skin Impairment: Handouts: Other: wound care as  prescribed Methods: Demonstration Responses: State content correctly Electronic  Signature(s) Signed: 05/29/2015 4:39:51 PM By: Gretta Cool, RN, [name], Kim RN, [name] Entered By: Gretta Cool, RN, [name], Kim on 05/29/2015 08:46:21 [place]on, [name] ([id number]) -------------------------------------------------------------------------------- Wound Assessment Details Patient Name: SOHEILA, KORBA. Date of Service: 05/29/2015 8:00 AM Medical Record Number: [id number] Patient Account Number: [id number] Date of Birth/Sex: Nov 24, 1955 (59 y.o. Female) Treating RN: Montey Hora Primary Care Physician: Steele Sizer Other Clinician: Referring Physician: Steele Sizer Treating Physician/Extender: Frann Rider in Treatment: 22 Wound Status Wound Number: 1 Primary Pressure Ulcer Etiology: Wound Location: Right Back Wound Open Wounding Event: Blister Status: Date Acquired: 10/19/2013 Comorbid Glaucoma, Anemia, Coronary Artery Weeks Of Treatment: 22 History: Disease, Hypertension, Type II Clustered Wound: No Diabetes, End Stage Renal Disease, Osteoarthritis, Neuropathy Photos Photo Uploaded By: Gretta Cool, RN, [name], Kim on 05/29/2015 09:26:05 Wound Measurements Length: (cm) 3 Width: (cm) 3.3 Depth: (cm) 0.1 Area: (cm) 7.775 Volume: (cm) 0.778 % Reduction in Area: 79.8% % Reduction in Volume: 79.8% Wound Description Classification: Category/Stage II Wound Margin: Indistinct, nonvisible Exudate Amount: Medium Exudate Type: Serosanguineous Exudate Color: red, brown Wound Bed Granulation Amount: Small (1-33%) Exposed Structure Granulation Quality: Red Fascia Exposed: No Necrotic Amount: None Present (0%) Fat Layer Exposed: No Pietila, Jazariah S. ([id number]) Tendon Exposed: No Muscle Exposed: No Joint Exposed: No Bone Exposed: No Limited to Skin Breakdown Periwound Skin Texture Texture Color No Abnormalities Noted: No No Abnormalities Noted: No Callus: No Atrophie Blanche: No Crepitus:  No Cyanosis: No Excoriation: No Ecchymosis: No Fluctuance: No Erythema: No Friable: No Hemosiderin Staining: No Induration: No Mottled: No Localized Edema: No Pallor: No Rash: No Rubor: No Scarring: No Moisture No Abnormalities Noted: No Dry / Scaly: No Maceration: No Moist: Yes Wound Preparation Ulcer Cleansing: Rinsed/Irrigated with Saline Topical Anesthetic Applied: None Treatment Notes Wound #1 (Right Back) Notes siltec sorbact hydroactive B Electronic Signature(s) Signed: 05/29/2015 2:30:55 PM By: Lorine Bears RCP, RRT, CHT Signed: 06/01/2015 5:00:47 PM By: Montey Hora Entered By: Lorine Bears on 05/29/2015 08:30:42 Kennerly, [name] ([id number]) -------------------------------------------------------------------------------- Wound Assessment Details Patient Name: Cryan, Shade S. Date of Service: 05/29/2015 8:00 AM Medical Record Number: [id number] Patient Account Number: [id number] Date of Birth/Sex: 03/22/1956 (59 y.o. Female) Treating RN: Montey Hora Primary Care Physician: Steele Sizer Other Clinician: Referring Physician: Steele Sizer Treating Physician/Extender: Frann Rider in Treatment: 22 Wound Status Wound Number: 6 Primary Venous Leg Ulcer Etiology: Wound Location: Right Lower Leg - Midline Wound Open Wounding Event: Blister Status: Date Acquired: 05/22/2015 Comorbid Glaucoma, Anemia, Coronary Artery Weeks Of Treatment: 0 History: Disease, Hypertension, Type II Clustered Wound: No Diabetes, End Stage Renal Disease, Osteoarthritis, Neuropathy Photos Photo Uploaded By: Gretta Cool, RN, [name], Kim on 05/29/2015 09:26:05 Wound Measurements Length: (cm) 2 % Reduction Width: (cm) 1.2 % Reduction Depth: (cm) 0.1 Area: (cm) 1.885 Volume: (cm) 0.188 in Area: in Volume: Wound Description Classification: Partial Thickness Diabetic Severity Earleen Newport): Grade 1 Wound Margin: Flat and Intact Exudate  Amount: Small Exudate Type: Serous Exudate Color: amber Wound Bed Granulation Amount: Large (67-100%) Exposed Structure Granulation Quality: Red Fascia Exposed: No Star, Marticia S. ([id number]) Necrotic Amount: None Present (0%) Fat Layer Exposed: No Tendon Exposed: No Muscle Exposed: No Joint Exposed: No Bone Exposed: No Limited to Skin Breakdown Periwound Skin Texture Texture Color No Abnormalities Noted: No No Abnormalities Noted: No Callus: No Atrophie Blanche: No Crepitus: No Cyanosis: No Excoriation: No Ecchymosis: No Fluctuance: No Erythema: No Friable: No Hemosiderin Staining: No Induration: No Mottled: No Localized Edema: No Pallor: No Rash: No Rubor: No Scarring: No Moisture  No Abnormalities Noted: No Dry / Scaly: No Maceration: No Moist: No Wound Preparation Ulcer Cleansing: Rinsed/Irrigated with Saline Topical Anesthetic Applied: Other: lidociane 4%, Treatment Notes Wound #6 (Right, Midline Lower Leg) Notes Mepitel lite Electronic Signature(s) Signed: 05/29/2015 2:30:55 PM By: Lorine Bears RCP, RRT, CHT Signed: 06/01/2015 5:00:47 PM By: Montey Hora Entered By: Lorine Bears on 05/29/2015 08:27:56 Caputo, [name] ([id number]) -------------------------------------------------------------------------------- Vitals Details [name]. Date of Service: 05/29/2015 8:00 AM Medical Record Number: [id number] Patient Account Number: [id number] Date of Birth/Sex: 11-28-1955 (59 y.o. Female) Treating RN: Cornell Barman Primary Care Physician: Steele Sizer Other Clinician: Referring Physician: Steele Sizer Treating Physician/Extender: Frann Rider in Treatment: 22 Vital Signs Time Taken: 08:15 Temperature (F): 97.7 Height (in): 65 Pulse (bpm): 84 Weight (lbs): 156 Respiratory Rate (breaths/min): 18 Body Mass Index (BMI): 26 Blood Pressure (mmHg): 137/65 Reference Range: 80 - 120 mg /  dl Electronic Signature(s) Signed: 05/29/2015 4:39:51 PM By: Gretta Cool, RN, [name], Kim RN, [name] Entered By: Gretta Cool, RN, [name], Kim on 05/29/2015 08:41:36

## 2015-06-05 ENCOUNTER — Encounter: Payer: Medicare Other | Admitting: Surgery

## 2015-06-05 DIAGNOSIS — E11622 Type 2 diabetes mellitus with other skin ulcer: Secondary | ICD-10-CM | POA: Diagnosis not present

## 2015-06-06 NOTE — Progress Notes (Addendum)
CRYSTALANN, DEVENDORF ([id number]) Visit Report for 06/05/2015 Arrival Information Details [name], [name]. Date of Service: 06/05/2015 8:00 AM Medical Record Number: [id number] Patient Account Number: [id number] Date of Birth/Sex: 09-Sep-1955 (59 y.o. Female) Treating RN: [name], RN, [name], [name] Primary Care Physician: [name] Other Clinician: Referring Physician: [name] Treating Physician/Extender: Frann Rider in Treatment: 23 Visit Information History Since Last Visit Any new allergies or adverse reactions: No Patient Arrived: [name] Had a fall or experienced change in No Arrival Time: 08:04 activities of daily living that may affect Accompanied By: self risk of falls: Transfer Assistance: None Signs or symptoms of abuse/neglect since last No Patient Identification Verified: Yes visito Secondary Verification Process Yes Has Dressing in Place as Prescribed: Yes Completed: Pain Present Now: No Patient Requires Transmission- No Based Precautions: Patient Has Alerts: Yes Patient Alerts: Patient on Blood Thinner ABI R: 1.28 Electronic Signature(s) Signed: 06/05/2015 8:06:06 AM By: Regan Lemming [name], RN Entered By: Regan Lemming on 06/05/2015 08:06:06 [name] ([id number]) -------------------------------------------------------------------------------- Clinic Level of Care Assessment Details [name]. Date of Service: 06/05/2015 8:00 AM Medical Record Number: [id number] Patient Account Number: [id number] Date of Birth/Sex: [birth year] (59 y.o. Female) Treating RN: [name], RN, [name], [name] Primary Care Physician: [name] Other Clinician: Referring Physician: [name] Treating Physician/Extender: Frann Rider in Treatment: 23 Clinic Level of Care Assessment Items TOOL 4 Quantity Score []  - Use when only an EandM is performed on FOLLOW-UP visit 0 ASSESSMENTS - Nursing Assessment / Reassessment X - Reassessment of  Co-morbidities (includes updates in patient status) 1 10 X - Reassessment of Adherence to Treatment Plan 1 5 ASSESSMENTS - Wound and Skin Assessment / Reassessment []  - Simple Wound Assessment / Reassessment - one wound 0 X - Complex Wound Assessment / Reassessment - multiple wounds 2 5 []  - Dermatologic / Skin Assessment (not related to wound area) 0 ASSESSMENTS - Focused Assessment []  - Circumferential Edema Measurements - multi extremities 0 []  - Nutritional Assessment / Counseling / Intervention 0 X - Lower Extremity Assessment (monofilament, tuning fork, pulses) 1 5 []  - Peripheral Arterial Disease Assessment (using hand held doppler) 0 ASSESSMENTS - Ostomy and/or Continence Assessment and Care []  - Incontinence Assessment and Management 0 []  - Ostomy Care Assessment and Management (repouching, etc.) 0 PROCESS - Coordination of Care X - Simple Patient / Family Education for ongoing care 1 15 []  - Complex (extensive) Patient / Family Education for ongoing care 0 []  - Staff obtains Programmer, systems, Records, Test Results / Process Orders 0 []  - Staff telephones HHA, Nursing Homes / Clarify orders / etc 0 []  - Routine Transfer to another Facility (non-emergent condition) 0 [name], [name] ([id number]) []  - Routine Hospital Admission (non-emergent condition) 0 []  - New Admissions / Biomedical engineer / Ordering NPWT, Apligraf, etc. 0 []  - Emergency Hospital Admission (emergent condition) 0 []  - Simple Discharge Coordination 0 []  - Complex (extensive) Discharge Coordination 0 PROCESS - Special Needs []  - Pediatric / Minor Patient Management 0 []  - Isolation Patient Management 0 []  - Hearing / Language / Visual special needs 0 []  - Assessment of Community assistance (transportation, D/C planning, etc.) 0 []  - Additional assistance / Altered mentation 0 []  - Support Surface(s) Assessment (bed, cushion, seat, etc.) 0 INTERVENTIONS - Wound Cleansing / Measurement []  - Simple Wound  Cleansing - one wound 0 X - Complex Wound Cleansing - multiple wounds 2 5 X - Wound Imaging (photographs - any number of wounds)  1 5 []  - Wound Tracing (instead of photographs) 0 []  - Simple Wound Measurement - one wound 0 X - Complex Wound Measurement - multiple wounds 2 5 INTERVENTIONS - Wound Dressings []  - Small Wound Dressing one or multiple wounds 0 X - Medium Wound Dressing one or multiple wounds 2 15 []  - Large Wound Dressing one or multiple wounds 0 []  - Application of Medications - topical 0 []  - Application of Medications - injection 0 INTERVENTIONS - Miscellaneous []  - External ear exam 0 [name], [name] ([id number]) []  - Specimen Collection (cultures, biopsies, blood, body fluids, etc.) 0 []  - Specimen(s) / Culture(s) sent or taken to Lab for analysis 0 []  - Patient Transfer (multiple staff / Harrel Lemon Lift / Similar devices) 0 []  - Simple Staple / Suture removal (25 or less) 0 []  - Complex Staple / Suture removal (26 or more) 0 []  - Hypo / Hyperglycemic Management (close monitor of Blood Glucose) 0 []  - Ankle / Brachial Index (ABI) - do not check if billed separately 0 X - Vital Signs 1 5 Has the patient been seen at the hospital within the last three years: Yes Total Score: 105 Level Of Care: New/Established - Level 3 Electronic Signature(s) Signed: 06/05/2015 8:33:59 AM By: Regan Lemming [name], RN Entered By: Regan Lemming on 06/05/2015 08:33:59 [name] ([id number]) -------------------------------------------------------------------------------- Complex / Palliative Patient Assessment Details Patient Name: [name], [name] Date of Service: 06/05/2015 8:00 AM Medical Record Number: [id number] Patient Account Number: [id number] Date of Birth/Sex: Aug 27, 1955 (59 y.o. Female) Treating RN: Cornell Barman Primary Care Physician: [name] Other Clinician: Referring Physician: [name] Treating Physician/Extender: Frann Rider in Treatment: 23 Palliative  Management Criteria Complex Wound Management Criteria Patient has remarkable or complex co-morbidities requiring medications or treatments that extend wound healing times. Examples: o Diabetes mellitus with chronic renal failure or end stage renal disease requiring dialysis o Advanced or poorly controlled rheumatoid arthritis o Diabetes mellitus and end stage chronic obstructive pulmonary disease o Active cancer with current chemo- or radiation therapy ESRD Care Approach Wound Care Plan: Complex Wound Management Electronic Signature(s) Signed: 06/15/2015 5:17:02 PM By: Gretta Cool RN, [name], Kim RN, [name] Signed: 06/22/2015 4:31:40 PM By: [name], [name] Entered By: Gretta Cool, RN, [name], Kim on 06/15/2015 10:16:33 Cotugno, [name] ([id number]) -------------------------------------------------------------------------------- Encounter Discharge Information Details Patient Name: RAGNHILD, FORNESS. Date of Service: 06/05/2015 8:00 AM Medical Record Number: [id number] Patient Account Number: [id number] Date of Birth/Sex: 13-Oct-1955 (59 y.o. Female) Treating RN: Baruch Gouty, RN, [name], [name] Primary Care Physician: [name] Other Clinician: Referring Physician: [name] Treating Physician/Extender: Frann Rider in Treatment: 23 Encounter Discharge Information Items Discharge Pain Level: 0 Discharge Condition: Stable Ambulatory Status: Walker Discharge Destination: Nursing Home Transportation: Other Accompanied By: self Schedule Follow-up Appointment: No Medication Reconciliation completed and provided to Patient/Care No Fedora Knisely: Provided on Clinical Summary of Care: 06/05/2015 Form Type Recipient Paper Patient LD Electronic Signature(s) Signed: 06/05/2015 8:44:43 AM By: Regan Lemming [name], RN Previous Signature: 06/05/2015 8:33:23 AM Version By: Ruthine Dose Entered By: Regan Lemming on 06/05/2015 08:44:43 Rawson, [name]  ([id number]) -------------------------------------------------------------------------------- Lower Extremity Assessment Details Patient Name: Branton, Jasmeet S. Date of Service: 06/05/2015 8:00 AM Medical Record Number: [id number] Patient Account Number: [id number] Date of Birth/Sex: 1956-04-27 (59 y.o. Female) Treating RN: Baruch Gouty, RN, [name], [name] Primary Care Physician: [name] Other Clinician: Referring Physician: [name] Treating Physician/Extender: Frann Rider in Treatment: 23 Vascular Assessment Pulses: Posterior Tibial Dorsalis Pedis Palpable: [Right:Yes] Extremity colors, hair  growth, and conditions: Extremity Color: [Right:Normal] Hair Growth on Extremity: [Right:No] Temperature of Extremity: [Right:Warm] Capillary Refill: [Right:< 3 seconds] Toe Nail Assessment Left: Right: Thick: Yes Discolored: No Deformed: No Improper Length and Hygiene: No Electronic Signature(s) Signed: 06/05/2015 8:11:19 AM By: Regan Lemming [name], RN Entered By: Regan Lemming on 06/05/2015 08:11:19 Erno, [name] ([id number]) -------------------------------------------------------------------------------- Multi Wound Chart Details [name]. Date of Service: 06/05/2015 8:00 AM Medical Record Number: [id number] Patient Account Number: [id number] Date of Birth/Sex: September 17, 1955 (59 y.o. Female) Treating RN: Baruch Gouty, RN, [name], [name] Primary Care Physician: [name] Other Clinician: Referring Physician: [name] Treating Physician/Extender: Frann Rider in Treatment: 23 Vital Signs Height(in): 65 Pulse(bpm): 91 Weight(lbs): 156 Blood Pressure 137/65 (mmHg): Body Mass Index(BMI): 26 Temperature(F): 98 Respiratory Rate 18 (breaths/min): Photos: [1:No Photos] [6:No Photos] [N/A:N/A] Wound Location: [1:Right Back] [6:Right Lower Leg - Midline N/A] Wounding Event: [1:Blister] [6:Blister] [N/A:N/A] Primary Etiology: [1:Pressure Ulcer] [6:Venous Leg  Ulcer] [N/A:N/A] Comorbid History: [1:Glaucoma, Anemia, Coronary Artery Disease, Coronary Artery Disease, Hypertension, Type II Diabetes, End Stage Renal Disease, Osteoarthritis, Neuropathy Osteoarthritis, Neuropathy] [6:Glaucoma, Anemia, Hypertension, Type II Diabetes,  End Stage Renal Disease,] [N/A:N/A] Date Acquired: [1:10/19/2013] [6:05/22/2015] [N/A:N/A] Weeks of Treatment: [1:23] [6:1] [N/A:N/A] Wound Status: [1:Open] [6:Open] [N/A:N/A] Measurements L x W x D 3x3x0.1 [6:1.5x0.5x0.1] [N/A:N/A] (cm) Area (cm) : [1:7.069] [6:0.589] [N/A:N/A] Volume (cm) : [1:0.707] [6:0.059] [N/A:N/A] % Reduction in Area: [1:81.60%] [6:68.80%] [N/A:N/A] % Reduction in Volume: 81.60% [6:68.60%] [N/A:N/A] Classification: [1:Category/Stage II] [6:Partial Thickness] [N/A:N/A] HBO Classification: [1:N/A] [6:Grade 1] [N/A:N/A] Exudate Amount: [1:Large] [6:Small] [N/A:N/A] Exudate Type: [1:Sanguinous] [6:Serous] [N/A:N/A] Exudate Color: [1:red] [6:amber] [N/A:N/A] Wound Margin: [1:Indistinct, nonvisible] [6:Flat and Intact] [N/A:N/A] Granulation Amount: [1:Small (1-33%)] [6:Large (67-100%)] [N/A:N/A] Granulation Quality: [1:Red] [6:Red] [N/A:N/A] Necrotic Amount: [1:None Present (0%)] [6:None Present (0%)] [N/A:N/A] Exposed Structures: [1:Fascia: No Fat: No] [6:Fascia: No Fat: No] [N/A:N/A] Tendon: No Tendon: No Muscle: No Muscle: No Joint: No Joint: No Bone: No Bone: No Limited to Skin Limited to Skin Breakdown Breakdown Epithelialization: None N/A N/A Periwound Skin Texture: Edema: No Edema: No N/A Excoriation: No Excoriation: No Induration: No Induration: No Callus: No Callus: No Crepitus: No Crepitus: No Fluctuance: No Fluctuance: No Friable: No Friable: No Rash: No Rash: No Scarring: No Scarring: No Periwound Skin Moist: Yes Moist: Yes N/A Moisture: Maceration: No Maceration: No Dry/Scaly: No Dry/Scaly: No Periwound Skin Color: Atrophie Blanche: No Atrophie Blanche: No  N/A Cyanosis: No Cyanosis: No Ecchymosis: No Ecchymosis: No Erythema: No Erythema: No Hemosiderin Staining: No Hemosiderin Staining: No Mottled: No Mottled: No Pallor: No Pallor: No Rubor: No Rubor: No Temperature: N/A No Abnormality N/A Tenderness on No No N/A Palpation: Wound Preparation: Ulcer Cleansing: Ulcer Cleansing: N/A Rinsed/Irrigated with Rinsed/Irrigated with Saline Saline Topical Anesthetic Topical Anesthetic Applied: None Applied: None Treatment Notes Electronic Signature(s) Signed: 06/05/2015 8:22:16 AM By: Regan Lemming [name], RN Entered By: Regan Lemming on 06/05/2015 08:22:16 Cummiskey, [name] ([id number]) -------------------------------------------------------------------------------- [place] Details [name]. Date of Service: 06/05/2015 8:00 AM Medical Record Number: [id number] Patient Account Number: [id number] Date of Birth/Sex: Jul 27, 1955 (59 y.o. Female) Treating RN: Baruch Gouty, RN, [name], [name] Primary Care Physician: [name] Other Clinician: Referring Physician: [name] Treating Physician/Extender: Frann Rider in Treatment: 15 Active Inactive Abuse / Safety / Falls / Self Care Management Nursing Diagnoses: Potential for falls Goals: Patient will remain injury free Date Initiated: 12/23/2014 Goal Status: Active Patient/caregiver will verbalize understanding of skin care regimen Date Initiated: 12/23/2014 Goal Status: Active Patient/caregiver will  verbalize/demonstrate measures taken to prevent injury and/or falls Date Initiated: 12/23/2014 Goal Status: Active Patient/caregiver will verbalize/demonstrate understanding of what to do in case of emergency Date Initiated: 12/23/2014 Goal Status: Active Interventions: Assess fall risk on admission and as needed Assess: immobility, friction, shearing, incontinence upon admission and as needed Assess impairment of mobility on admission and as needed per  policy Assess self care needs on admission and as needed Provide education on fall prevention Notes: Wound/Skin Impairment Nursing Diagnoses: Impaired tissue integrity Knowledge deficit related to ulceration/compromised skin integrity Goals: Patient/caregiver will verbalize understanding of skin care regimen AMEELAH, LACINA ([id number]) Date Initiated: 12/23/2014 Goal Status: Active Ulcer/skin breakdown will heal within 14 weeks Date Initiated: 12/23/2014 Goal Status: Active Interventions: Assess patient/caregiver ability to obtain necessary supplies Assess patient/caregiver ability to perform ulcer/skin care regimen upon admission and as needed Assess ulceration(s) every visit Provide education on ulcer and skin care Treatment Activities: Skin care regimen initiated : 06/05/2015 Topical wound management initiated : 06/05/2015 Notes: Electronic Signature(s) Signed: 06/05/2015 8:22:10 AM By: Regan Lemming [name], RN Entered By: Regan Lemming on 06/05/2015 08:22:10 Boulter, [name] ([id number]) -------------------------------------------------------------------------------- Pain Assessment Details [name]. Date of Service: 06/05/2015 8:00 AM Medical Record Number: [id number] Patient Account Number: [id number] Date of Birth/Sex: 07-23-1955 (59 y.o. Female) Treating RN: Baruch Gouty, RN, [name], [name] Primary Care Physician: [name] Other Clinician: Referring Physician: [name] Treating Physician/Extender: Frann Rider in Treatment: 23 Active Problems Location of Pain Severity and Description of Pain Patient Has Paino No Site Locations Pain Management and Medication Current Pain Management: Electronic Signature(s) Signed: 06/05/2015 8:06:20 AM By: Regan Lemming [name], RN Entered By: Regan Lemming on 06/05/2015 08:06:20 Boehringer, [name] ([id number]) -------------------------------------------------------------------------------- Patient/Caregiver Education  Details [name]. Date of Service: 06/05/2015 8:00 AM Medical Record Number: [id number] Patient Account Number: [id number] Date of Birth/Gender: Apr 03, 1956 (59 y.o. Female) Treating RN: Baruch Gouty, RN, [name], [name] Primary Care Physician: [name] Other Clinician: Referring Physician: [name] Treating Physician/Extender: Frann Rider in Treatment: 84 Education Assessment Education Provided To: Patient Education Topics Provided Safety: Methods: Explain/Verbal Responses: State content correctly Wound/Skin Impairment: Methods: Explain/Verbal Responses: State content correctly Electronic Signature(s) Signed: 06/05/2015 8:44:56 AM By: Regan Lemming [name], RN Entered By: Regan Lemming on 06/05/2015 08:44:56 Eller, [name] ([id number]) -------------------------------------------------------------------------------- Wound Assessment Details Patient Name: [name], Tangi S. Date of Service: 06/05/2015 8:00 AM Medical Record Number: [id number] Patient Account Number: [id number] Date of Birth/Sex: 01-Jun-1956 (59 y.o. Female) Treating RN: [name], RN, [name], [name] Primary Care Physician: [name] Other Clinician: Referring Physician: [name] Treating Physician/Extender: Frann Rider in Treatment: 23 Wound Status Wound Number: 1 Primary Pressure Ulcer Etiology: Wound Location: Right Back Wound Open Wounding Event: Blister Status: Date Acquired: 10/19/2013 Comorbid Glaucoma, Anemia, Coronary Artery Weeks Of Treatment: 23 History: Disease, Hypertension, Type II Clustered Wound: No Diabetes, End Stage Renal Disease, Osteoarthritis, Neuropathy Photos Photo Uploaded By: Regan Lemming on 06/05/2015 16:34:44 Wound Measurements Length: (cm) 3 Width: (cm) 3 Depth: (cm) 0.1 Area: (cm) 7.069 Volume: (cm) 0.707 % Reduction in Area: 81.6% % Reduction in Volume: 81.6% Epithelialization: None Tunneling: No Undermining: No Wound  Description Classification: Category/Stage II Wound Margin: Indistinct, nonvisible Exudate Amount: Large Exudate Type: Sanguinous Exudate Color: red Foul Odor After Cleansing: No Wound Bed Granulation Amount: Small (1-33%) Exposed Structure Granulation Quality: Red Fascia Exposed: No Necrotic Amount: None Present (0%) Fat Layer Exposed: No Otterness, Kariss S. ([id number]) Tendon Exposed: No Muscle Exposed: No Joint Exposed: No Bone Exposed:  No Limited to Skin Breakdown Periwound Skin Texture Texture Color No Abnormalities Noted: No No Abnormalities Noted: No Callus: No Atrophie Blanche: No Crepitus: No Cyanosis: No Excoriation: No Ecchymosis: No Fluctuance: No Erythema: No Friable: No Hemosiderin Staining: No Induration: No Mottled: No Localized Edema: No Pallor: No Rash: No Rubor: No Scarring: No Moisture No Abnormalities Noted: No Dry / Scaly: No Maceration: No Moist: Yes Wound Preparation Ulcer Cleansing: Rinsed/Irrigated with Saline Topical Anesthetic Applied: None Electronic Signature(s) Signed: 06/05/2015 8:13:29 AM By: Regan Lemming [name], RN Entered By: Regan Lemming on 06/05/2015 08:13:29 Benedetti, [name] ([id number]) -------------------------------------------------------------------------------- Wound Assessment Details [name], Leomia S. Date of Service: 06/05/2015 8:00 AM Medical Record Number: [id number] Patient Account Number: [id number] Date of Birth/Sex: [birth year] (59 y.o. Female) Treating RN: [name], RN, [name], [name] Primary Care Physician: [name] Other Clinician: Referring Physician: [name] Treating Physician/Extender: Frann Rider in Treatment: 23 Wound Status Wound Number: 6 Primary Venous Leg Ulcer Etiology: Wound Location: Right Lower Leg - Midline Wound Open Wounding Event: Blister Status: Date Acquired: 05/22/2015 Comorbid Glaucoma, Anemia, Coronary Artery Weeks Of Treatment: 1 History: Disease,  Hypertension, Type II Clustered Wound: No Diabetes, End Stage Renal Disease, Osteoarthritis, Neuropathy Photos Photo Uploaded By: Regan Lemming on 06/05/2015 16:34:44 Wound Measurements Length: (cm) 1.5 Width: (cm) 0.5 Depth: (cm) 0.1 Area: (cm) 0.589 Volume: (cm) 0.059 % Reduction in Area: 68.8% % Reduction in Volume: 68.6% Tunneling: No Undermining: No Wound Description Classification: Partial Thickness Diabetic Severity Earleen Newport): Grade 1 Wound Margin: Flat and Intact Exudate Amount: Small Exudate Type: Serous Exudate Color: amber Wound Bed Granulation Amount: Large (67-100%) Exposed Structure Granulation Quality: Red Fascia Exposed: No Gharibian, Brooklyne S. ([id number]) Necrotic Amount: None Present (0%) Fat Layer Exposed: No Tendon Exposed: No Muscle Exposed: No Joint Exposed: No Bone Exposed: No Limited to Skin Breakdown Periwound Skin Texture Texture Color No Abnormalities Noted: No No Abnormalities Noted: No Callus: No Atrophie Blanche: No Crepitus: No Cyanosis: No Excoriation: No Ecchymosis: No Fluctuance: No Erythema: No Friable: No Hemosiderin Staining: No Induration: No Mottled: No Localized Edema: No Pallor: No Rash: No Rubor: No Scarring: No Temperature / Pain Moisture Temperature: No Abnormality No Abnormalities Noted: No Dry / Scaly: No Maceration: No Moist: Yes Wound Preparation Ulcer Cleansing: Rinsed/Irrigated with Saline Topical Anesthetic Applied: None Electronic Signature(s) Signed: 06/05/2015 8:13:45 AM By: Regan Lemming [name], RN Entered By: Regan Lemming on 06/05/2015 08:13:45 [place], [name] ([id number]) -------------------------------------------------------------------------------- Vitals Details [name]. Date of Service: 06/05/2015 8:00 AM Medical Record Number: [id number] Patient Account Number: [id number] Date of Birth/Sex: [birth year] (59 y.o. Female) Treating RN: [name], RN, [name], [name] Primary Care  Physician: [name] Other Clinician: Referring Physician: [name] Treating Physician/Extender: Frann Rider in Treatment: 23 Vital Signs Time Taken: 08:06 Temperature (F): 98 Height (in): 65 Pulse (bpm): 91 Weight (lbs): 156 Respiratory Rate (breaths/min): 18 Body Mass Index (BMI): 26 Blood Pressure (mmHg): 137/65 Reference Range: 80 - 120 mg / dl Electronic Signature(s) Signed: 06/05/2015 8:06:41 AM By: Regan Lemming [name], RN Entered By: Regan Lemming on 06/05/2015 08:06:41

## 2015-06-06 NOTE — Progress Notes (Addendum)
[name], [name] ([id number]) Visit Report for 06/05/2015 Chief Complaint Document Details [name], [name] 06/05/2015 8:00 Date of Service: AM Medical Record [id number] Number: Patient Account Number: [id number] [birth year] (59 y.o. Treating RN: Baruch Gouty, RN, [name], [name] Date of Birth/Sex: Female) Other Clinician: Primary Care Physician: [name] Treating Marji Kuehnel Referring Physician: Steele Sizer Physician/Extender: [name]: 23 Information Obtained from: Patient Chief Complaint Patient presents to the wound care center for a consult due non healing wound. She has an open wound on her right upper back which she's had for about a year and she recently noticed a blister on her right lower extremity about 2 weeks ago. Electronic Signature(s) Signed: 06/05/2015 8:55:25 AM By: [name], [name] Entered By: [name] on 06/05/2015 08:55:25 Dungan, [name] ([id number]) -------------------------------------------------------------------------------- HPI Details [name], [name] 06/05/2015 8:00 Date of Service: AM Medical Record [id number] Number: Patient Account Number: [id number] 1955-11-28 (59 y.o. Treating RN: [name], RN, [name], [name] Date of Birth/Sex: Female) Other Clinician: Primary Care Physician: [name] Treating Zailee Vallely Referring Physician: Steele Sizer Physician/Extender: [name]: 23 History of Present Illness Location: right upper back and right lower extremity wounds Quality: Patient reports No Pain. Severity: Patient states wound (s) are getting better. Duration: Patient has had the wound for > 3 months prior to seeking treatment at the wound center Timing: she thought it first occurred when she was using a heating pad about a year ago after back surgery. Context: The wound appeared gradually over time Modifying Factors: Patient is currently on renal dialysis and receives treatments 3 times  weekly Associated Signs and Symptoms: Patient reports having: surgery scheduled for this week for a AV fistula left arm. HPI Description: 59 year old patient who is known to be a diabetic and has end-stage renal disease has had several comorbidities including coronary artery disease, hypertension, hyperlipidemia, pancreatitis, anemia, previous history of hysterectomy, cholecystectomy, left-sided salivary gland excision, bilateral cataract surgery,Peritoneal dialysis catheter, hemodialysis catheter. the area on the back has also been caused by instant pressure she used to sleep on a recliner all day and has significant kyphoscoliosis. As far as the wound on her right lower extremity she's not sure how this blister occurred but she thought it has been there for about 2 weeks. No recent blood investigations available and no recent hemoglobin A1c. 12/30/2014 -- she is an assisted living facility but I believe the nurses that have not followed instructions as she had some cream applied on her back and there was a different dressing. Last week she's had a AV fistula placed on her left forearm. 01/13/2015 -- she has had some localized infection at the port site and she's been on doxycycline for this. 02/05/2015 - he has developed a small blister on her right lower extremity. 02/10/2015 -- she has developed another small blister on her right anterior chest wall in the area where she's had tape for her dialysis access. this may just be injury caused by a tape burn. 02/18/2015 -- no new blisters and she had a dermatology opinion and they have taken a biopsy of her skin. She also had a left brachial AV fistula placed this week. 03/03/2015 -- though we do not have the pathology report yet the patient says she has been put on prednisone because this skin disease is possibly to do with her immune system and her dermatologist is recommended this. 03/10/2015 -- she has developed a new blister which is quite  large on her right lower  extremity on the shin. 05/22/2015 -- she did very well with her dressing but on trying to remove the [place] it peels away Mcnerney, Savina S. ([id number]) the newly healed skin. 06/05/2015 -- the patient was in the ER this past week for severe bleeding from the right nostril and had to have ENT see her and do a packing. They've also been holding her heparin during her dialysis. The patient is going back to ENT today. Other than that she has had no other significant issues. Electronic Signature(s) Signed: 06/05/2015 8:56:07 AM By: [name], [name] Entered By: [name] on 06/05/2015 08:56:07 Lautner, [name] ([id number]) -------------------------------------------------------------------------------- Physical Exam Details [name], [name] 06/05/2015 8:00 Date of Service: AM Medical Record [id number] Number: Patient Account Number: [id number] 1956/04/19 (59 y.o. Treating RN: Baruch Gouty, RN, [name], [name] Date of Birth/Sex: Female) Other Clinician: Primary Care Physician: [name] Treating Alba Perillo Referring Physician: Steele Sizer Physician/Extender: [name]: 23 Constitutional . Pulse regular. Respirations normal and unlabored. Afebrile. . Eyes Nonicteric. Reactive to light. Ears, Nose, Mouth, and Throat Lips, teeth, and gums WNL.Marland Kitchen Moist mucosa without lesions . Neck supple and nontender. No palpable supraclavicular or cervical adenopathy. Normal sized without goiter. Respiratory WNL. No retractions.. Cardiovascular Pedal Pulses WNL. No clubbing, cyanosis or edema. Lymphatic No adneopathy. No adenopathy. No adenopathy. Musculoskeletal Adexa without tenderness or enlargement.. Digits and nails w/o clubbing, cyanosis, infection, petechiae, ischemia, or inflammatory conditions.. Integumentary (Hair, Skin) No suspicious lesions. No crepitus or fluctuance. No peri-wound warmth or erythema. No  masses.Marland Kitchen Psychiatric Judgement and insight Intact.. No evidence of depression, anxiety, or agitation.. Notes the right leg has a minimal open area which will be treated appropriately with a local dressing. The ulcer on her back is now briskly bleeding in the area where the dressing was removed and will need silver nitrate cauterization to control the bleeding. Once this was done there is good control of the oozing from the wound. Electronic Signature(s) Signed: 06/05/2015 8:57:22 AM By: [name], [name] Entered By: [name] on 06/05/2015 08:57:22 [name], [name] ([id number]) -------------------------------------------------------------------------------- Physician Orders Details [name], [name] 06/05/2015 8:00 Date of Service: AM Medical Record [id number] Number: Patient Account Number: [id number] [birth year] (59 y.o. Treating RN: Baruch Gouty, RN, [name], [name] Date of Birth/Sex: Female) Other Clinician: Primary Care Physician: [name] Treating Fumiko Cham Referring Physician: Steele Sizer Physician/Extender: Suella [place] in Treatment: 64 Verbal / Phone Orders: Yes Clinician: [name], RN, [name], [name] Read Back and Verified: Yes Diagnosis Coding Wound Cleansing Wound #1 Right Back o Clean wound with Normal Saline. Wound #6 Right,Midline Lower Leg o Clean wound with Normal Saline. Skin Barriers/Peri-Wound Care Wound #1 Right Back o Skin Prep Wound #6 Right,Midline Lower Leg o Skin Prep Primary Wound Dressing Wound #1 Right Back o Aquacel Ag Wound #6 Right,Midline Lower Leg o Aquacel Ag Secondary Dressing Wound #1 Right Back o Boardered Foam Dressing - bolster with gauze under BFD Wound #6 Right,Midline Lower Leg o Foam - mepitel lite Dressing Change Frequency Wound #6 Right,Midline Lower Leg o Change dressing every week - change once a week. DO not remove contact layer/mepitel one Wound #1 Right Back o Other: - twice a week. [name], [name] ([id number]) Follow-up Appointments Wound #1 Right Back o Return Appointment in 2 weeks. Wound #6 Right,Midline Lower Leg o Return Appointment in 2 weeks. Electronic Signature(s) Signed: 06/05/2015 8:36:20 AM By: Regan Lemming [name], RN Signed: 06/05/2015 4:42:27 PM By: [name], [name] Previous Signature: 06/05/2015  8:33:10 AM Version By: Regan Lemming [name], RN Entered By: Regan Lemming on 06/05/2015 08:36:20 [name], [name] ([id number]) -------------------------------------------------------------------------------- Problem List Details [name], [name] 06/05/2015 8:00 Date of Service: AM Medical Record [id number] Number: Patient Account Number: [id number] 06-Nov-1955 (59 y.o. Treating RN: Baruch Gouty, RN, [name], [name] Date of Birth/Sex: Female) Other Clinician: Primary Care Physician: [name] Treating Malayia Spizzirri Referring Physician: Steele Sizer Physician/Extender: [name]: 23 Active Problems ICD-10 Encounter Code Description Active Date Diagnosis E11.622 Type 2 diabetes mellitus with other skin ulcer 12/23/2014 Yes L89.112 Pressure ulcer of right upper back, stage 2 12/23/2014 Yes S80.821A Blister (nonthermal), right lower leg, initial encounter 12/23/2014 Yes N18.6 End stage renal disease 12/23/2014 Yes Inactive Problems Resolved Problems Electronic Signature(s) Signed: 06/05/2015 8:54:52 AM By: [name], [name] Entered By: [name] on 06/05/2015 08:54:51 Glantz, [name] ([id number]) -------------------------------------------------------------------------------- Progress Note Details [name], IMDIEKE. 06/05/2015 8:00 Date of Service: AM Medical Record [id number] Number: Patient Account Number: [id number] [birth year] (59 y.o. Treating RN: Baruch Gouty, RN, [name], [name] Date of Birth/Sex: Female) Other Clinician: Primary Care Physician: [name] Treating Nashya Garlington Referring Physician: Steele Sizer Physician/Extender: [name]: 23 Subjective Chief Complaint Information obtained from Patient Patient presents to the wound care center for a consult due non healing wound. She has an open wound on her right upper back which she's had for about a year and she recently noticed a blister on her right lower extremity about 2 weeks ago. History of Present Illness (HPI) The following HPI elements were documented for the patient's wound: Location: right upper back and right lower extremity wounds Quality: Patient reports No Pain. Severity: Patient states wound (s) are getting better. Duration: Patient has had the wound for > 3 months prior to seeking treatment at the wound center Timing: she thought it first occurred when she was using a heating pad about a year ago after back surgery. Context: The wound appeared gradually over time Modifying Factors: Patient is currently on renal dialysis and receives treatments 3 times weekly Associated Signs and Symptoms: Patient reports having: surgery scheduled for this week for a AV fistula left arm. 59 year old patient who is known to be a diabetic and has end-stage renal disease has had several comorbidities including coronary artery disease, hypertension, hyperlipidemia, pancreatitis, anemia, previous history of hysterectomy, cholecystectomy, left-sided salivary gland excision, bilateral cataract surgery,Peritoneal dialysis catheter, hemodialysis catheter. the area on the back has also been caused by instant pressure she used to sleep on a recliner all day and has significant kyphoscoliosis. As far as the wound on her right lower extremity she's not sure how this blister occurred but she thought it has been there for about 2 weeks. No recent blood investigations available and no recent hemoglobin A1c. 12/30/2014 -- she is an assisted living facility but I believe the nurses that have not followed instructions as she had some cream  applied on her back and there was a different dressing. Last week she's had a AV fistula placed on her left forearm. 01/13/2015 -- she has had some localized infection at the port site and she's been on doxycycline for this. 02/05/2015 - he has developed a small blister on her right lower extremity. 02/10/2015 -- she has developed another small blister on her right anterior chest wall in the area where she's had tape for her dialysis access. this may just be injury caused by a tape burn. [name], [name] ([id number]) 02/18/2015 -- no new blisters and she  had a dermatology opinion and they have taken a biopsy of her skin. She also had a left brachial AV fistula placed this week. 03/03/2015 -- though we do not have the pathology report yet the patient says she has been put on prednisone because this skin disease is possibly to do with her immune system and her dermatologist is recommended this. 03/10/2015 -- she has developed a new blister which is quite large on her right lower extremity on the shin. 05/22/2015 -- she did very well with her dressing but on trying to remove the [place] it peels away the newly healed skin. 06/05/2015 -- the patient was in the ER this past week for severe bleeding from the right nostril and had to have ENT see her and do a packing. They've also been holding her heparin during her dialysis. The patient is going back to ENT today. Other than that she has had no other significant issues. Objective Constitutional Pulse regular. Respirations normal and unlabored. Afebrile. Vitals Time Taken: 8:06 AM, Height: 65 in, Weight: 156 lbs, BMI: 26, Temperature: 98 F, Pulse: 91 bpm, Respiratory Rate: 18 breaths/min, Blood Pressure: 137/65 mmHg. Eyes Nonicteric. Reactive to light. Ears, Nose, Mouth, and Throat Lips, teeth, and gums WNL.Marland Kitchen Moist mucosa without lesions . Neck supple and nontender. No palpable supraclavicular or cervical adenopathy. Normal sized without  goiter. Respiratory WNL. No retractions.. Cardiovascular Pedal Pulses WNL. No clubbing, cyanosis or edema. Lymphatic No adneopathy. No adenopathy. No adenopathy. Musculoskeletal Adexa without tenderness or enlargement.. Digits and nails w/o clubbing, cyanosis, infection, petechiae, ischemia, or inflammatory conditions.[name] ([id number]) Psychiatric Judgement and insight Intact.. No evidence of depression, anxiety, or agitation.. General Notes: the right leg has a minimal open area which will be treated appropriately with a local dressing. The ulcer on her back is now briskly bleeding in the area where the dressing was removed and will need silver nitrate cauterization to control the bleeding. Once this was done there is good control of the oozing from the wound. Integumentary (Hair, Skin) No suspicious lesions. No crepitus or fluctuance. No peri-wound warmth or erythema. No masses.. Wound #1 status is Open. Original cause of wound was Blister. The wound is located on the Right Back. The wound measures 3cm length x 3cm width x 0.1cm depth; 7.069cm^2 area and 0.707cm^3 volume. The wound is limited to skin breakdown. There is no tunneling or undermining noted. There is a large amount of sanguinous drainage noted. The wound margin is indistinct and nonvisible. There is small (1-33%) red granulation within the wound bed. There is no necrotic tissue within the wound bed. The periwound skin appearance exhibited: Moist. The periwound skin appearance did not exhibit: Callus, Crepitus, Excoriation, Fluctuance, Friable, Induration, Localized Edema, Rash, Scarring, Dry/Scaly, Maceration, Atrophie Blanche, Cyanosis, Ecchymosis, Hemosiderin Staining, Mottled, Pallor, Rubor, Erythema. Wound #6 status is Open. Original cause of wound was Blister. The wound is located on the Right,Midline Lower Leg. The wound measures 1.5cm length x 0.5cm width x 0.1cm depth; 0.589cm^2 area and 0.059cm^3  volume. The wound is limited to skin breakdown. There is no tunneling or undermining noted. There is a small amount of serous drainage noted. The wound margin is flat and intact. There is large (67- 100%) red granulation within the wound bed. There is no necrotic tissue within the wound bed. The periwound skin appearance exhibited: Moist. The periwound skin appearance did not exhibit: Callus, Crepitus, Excoriation, Fluctuance, Friable, Induration, Localized Edema, Rash, Scarring, Dry/Scaly, Maceration, Atrophie Blanche, Cyanosis, Ecchymosis, Hemosiderin  Staining, Mottled, Pallor, Rubor, Erythema. Periwound temperature was noted as No Abnormality. Assessment Active Problems ICD-10 E11.622 - Type 2 diabetes mellitus with other skin ulcer L89.112 - Pressure ulcer of right upper back, stage 2 S80.821A - Blister (nonthermal), right lower leg, initial encounter N18.6 - End stage renal disease Fleeman, [place] S. ([id number]) Having reviewed her recent ER visit I have recommended that they did not use any of her blood thinners and she should inform her nephrologist about the bleed. I have recommended silver alginate to be changed on alternate date for the wound on her back and covered with a bordered foam. We will use of Mepitel light on her right lower extremity and keep this covered for the week. Due to the holiday we will see her back in 2 weeks' time. Plan Wound Cleansing: Wound #1 Right Back: Clean wound with Normal Saline. Wound #6 Right,Midline Lower Leg: Clean wound with Normal Saline. Skin Barriers/Peri-Wound Care: Wound #1 Right Back: Skin Prep Wound #6 Right,Midline Lower Leg: Skin Prep Primary Wound Dressing: Wound #1 Right Back: Aquacel Ag Wound #6 Right,Midline Lower Leg: Aquacel Ag Secondary Dressing: Wound #1 Right Back: Boardered Foam Dressing - bolster with gauze under BFD Wound #6 Right,Midline Lower Leg: Foam - mepitel lite Dressing Change Frequency: Wound #6  Right,Midline Lower Leg: Change dressing every week - change once a week. DO not remove contact layer/mepitel one Wound #1 Right Back: Other: - twice a week. Follow-up Appointments: Wound #1 Right Back: Return Appointment in 2 weeks. Wound #6 Right,Midline Lower Leg: Return Appointment in 2 weeks. [name] ([id number]) Having reviewed her recent ER visit I have recommended that they did not use any of her blood thinners and she should inform her nephrologist about the bleed. I have recommended silver alginate to be changed on alternate date for the wound on her back and covered with a bordered foam. We will use of Mepitel light on her right lower extremity and keep this covered for the week. Due to the holiday we will see her back in 2 weeks' time. Electronic Signature(s) Signed: 06/05/2015 8:58:44 AM By: [name], [name] Entered By: [name] on 06/05/2015 08:58:44 Lalani, [name] ([id number]) -------------------------------------------------------------------------------- SuperBill Details Patient Name: MAYBEL, LEAMAN S. Date of Service: 06/05/2015 Medical Record Patient Account Number: [id number] Number: [name], RN, [name], Treating RN: 04-10-1956 (59 y.o. [name] Date of Birth/Sex: Female) Other Clinician: Primary Care Physician: [name] Treating Shamariah Shewmake Referring Physician: Steele Sizer Physician/Extender: [name]: 23 Diagnosis Coding ICD-10 Codes Code Description E11.622 Type 2 diabetes mellitus with other skin ulcer L89.112 Pressure ulcer of right upper back, stage 2 S80.821A Blister (nonthermal), right lower leg, initial encounter N18.6 End stage renal disease Facility Procedures CPT4 Code: [id number] Description: 99213 - WOUND CARE VISIT-LEV 3 EST PT Modifier: Quantity: 1 Physician Procedures CPT4 Code: [id number] Description: [id number] - WC PHYS LEVEL 3 - EST PT ICD-10 Description Diagnosis E11.622 Type 2 diabetes mellitus  with other skin ulcer L89.112 Pressure ulcer of right upper back, stage 2 S80.821A Blister (nonthermal), right lower leg, initial enc N18.6 End  stage renal disease Modifier: [place]ter Quantity: 1 Electronic Signature(s) Signed: 06/05/2015 8:59:00 AM By: [name], [name] Entered By: [name] on 06/05/2015 08:59:00

## 2015-06-19 ENCOUNTER — Encounter: Payer: Medicare Other | Attending: Surgery | Admitting: Surgery

## 2015-06-19 DIAGNOSIS — N186 End stage renal disease: Secondary | ICD-10-CM | POA: Insufficient documentation

## 2015-06-19 DIAGNOSIS — E11622 Type 2 diabetes mellitus with other skin ulcer: Secondary | ICD-10-CM | POA: Insufficient documentation

## 2015-06-19 DIAGNOSIS — I120 Hypertensive chronic kidney disease with stage 5 chronic kidney disease or end stage renal disease: Secondary | ICD-10-CM | POA: Diagnosis not present

## 2015-06-19 DIAGNOSIS — L89112 Pressure ulcer of right upper back, stage 2: Secondary | ICD-10-CM | POA: Diagnosis not present

## 2015-06-19 DIAGNOSIS — E785 Hyperlipidemia, unspecified: Secondary | ICD-10-CM | POA: Diagnosis not present

## 2015-06-19 DIAGNOSIS — X58XXXA Exposure to other specified factors, initial encounter: Secondary | ICD-10-CM | POA: Insufficient documentation

## 2015-06-19 DIAGNOSIS — Z992 Dependence on renal dialysis: Secondary | ICD-10-CM | POA: Diagnosis not present

## 2015-06-19 DIAGNOSIS — D649 Anemia, unspecified: Secondary | ICD-10-CM | POA: Insufficient documentation

## 2015-06-19 DIAGNOSIS — S80821A Blister (nonthermal), right lower leg, initial encounter: Secondary | ICD-10-CM | POA: Diagnosis not present

## 2015-06-19 DIAGNOSIS — I2510 Atherosclerotic heart disease of native coronary artery without angina pectoris: Secondary | ICD-10-CM | POA: Diagnosis not present

## 2015-06-20 NOTE — Progress Notes (Addendum)
FELESIA, ZUNINO ([id number]) Visit Report for 06/19/2015 Chief Complaint Document Details [name], [name] 06/19/2015 10:15 Date of Service: AM Medical Record [id number] Number: Patient Account Number: [id number] [birth year] (59 y.o. Treating RN: Cornell Barman Date of Birth/Sex: Female) Other Clinician: Primary Care Physician: [name] Treating Lucelia Lacey Referring Physician: [name] Physician/Extender: [name]: 25 Information Obtained from: Patient Chief Complaint Patient presents to the wound care center for a consult due non healing wound. She has an open wound on her right upper back which she's had for about a year and she recently noticed a blister on her right lower extremity about 2 weeks ago. Electronic Signature(s) Signed: 06/19/2015 11:02:28 AM By: [name], [name] Entered By: [name] on 06/19/2015 11:02:27 Hadaway, [name] ([id number]) -------------------------------------------------------------------------------- HPI Details [name], [name] 06/19/2015 10:15 Date of Service: AM Medical Record [id number] Number: Patient Account Number: [id number] 08-16-1955 (59 y.o. Treating RN: Cornell Barman Date of Birth/Sex: Female) Other Clinician: Primary Care Physician: [name] Treating Oluchi Pucci Referring Physician: [name] Physician/Extender: [name]: 25 History of Present Illness Location: right upper back and right lower extremity wounds Quality: Patient reports No Pain. Severity: Patient states wound (s) are getting better. Duration: Patient has had the wound for > 3 months prior to seeking treatment at the wound center Timing: she thought it first occurred when she was using a heating pad about a year ago after back surgery. Context: The wound appeared gradually over time Modifying Factors: Patient is currently on renal dialysis and receives treatments 3 times weekly Associated Signs and  Symptoms: Patient reports having: surgery scheduled for this week for a AV fistula left arm. HPI Description: 59 year old patient who is known to be a diabetic and has end-stage renal disease has had several comorbidities including coronary artery disease, hypertension, hyperlipidemia, pancreatitis, anemia, previous history of hysterectomy, cholecystectomy, left-sided salivary gland excision, bilateral cataract surgery,Peritoneal dialysis catheter, hemodialysis catheter. the area on the back has also been caused by instant pressure she used to sleep on a recliner all day and has significant kyphoscoliosis. As far as the wound on her right lower extremity she's not sure how this blister occurred but she thought it has been there for about 2 weeks. No recent blood investigations available and no recent hemoglobin A1c. 12/30/2014 -- she is an assisted living facility but I believe the nurses that have not followed instructions as she had some cream applied on her back and there was a different dressing. Last week she's had a AV fistula placed on her left forearm. 01/13/2015 -- she has had some localized infection at the port site and she's been on doxycycline for this. 02/05/2015 - he has developed a small blister on her right lower extremity. 02/10/2015 -- she has developed another small blister on her right anterior chest wall in the area where she's had tape for her dialysis access. this may just be injury caused by a tape burn. 02/18/2015 -- no new blisters and she had a dermatology opinion and they have taken a biopsy of her skin. She also had a left brachial AV fistula placed this week. 03/03/2015 -- though we do not have the pathology report yet the patient says she has been put on prednisone because this skin disease is possibly to do with her immune system and her dermatologist is recommended this. 03/10/2015 -- she has developed a new blister which is quite large on her right lower  extremity on the shin.  05/22/2015 -- she did very well with her dressing but on trying to remove the [facility] it peels away Curenton, Thetis S. ([id number]) the newly healed skin. 06/05/2015 -- the patient was in the ER this past week for severe bleeding from the right nostril and had to have ENT see her and do a packing. They've also been holding her heparin during her dialysis. The patient is going back to ENT today. Other than that she has had no other significant issues. 06/19/2015 -- she is back on her blood thinners and continues to have her hemodialysis as before. Electronic Signature(s) Signed: 06/19/2015 11:02:53 AM By: [name], [name] Entered By: [name] on 06/19/2015 11:02:52 Wiltrout, [name] ([id number]) -------------------------------------------------------------------------------- Otelia Sergeant TISS Details Patient Name: MARIELE, BANGERT 06/19/2015 10:15 Date of Service: AM Medical Record [id number] Number: Patient Account Number: [id number] 1955/11/30 (59 y.o. Treating RN: Cornell Barman Date of Birth/Sex: Female) Other Clinician: Primary Care Physician: [name] Treating [name] Referring Physician: [name] Physician/Extender: [name]: 25 Procedure Performed for: Wound #1 Right Back Performed By: Physician [name], MD Post Procedure Diagnosis Same as Pre-procedure Notes there was excessive hypergranulation tissue which was bleeding profusely and I had to use 3 sticks of silver nitrate before this could be controlled. She will have a Aquacel Ag dressing and some pressure over the bordered foam to help this be controlled. Electronic Signature(s) Signed: 06/19/2015 11:02:21 AM By: [name], [name] Entered By: [name] on 06/19/2015 11:02:21 [name] ([id number]) -------------------------------------------------------------------------------- Physical Exam Details Patient Name: NOLENE, STAHL  06/19/2015 10:15 Date of Service: AM Medical Record [id number] Number: Patient Account Number: [id number] 07-17-1956 (59 y.o. Treating RN: Cornell Barman Date of Birth/Sex: Female) Other Clinician: Primary Care Physician: [name] Treating Alaysia Lightle Referring Physician: [name] Physician/Extender: [name]: 25 Constitutional . Pulse regular. Respirations normal and unlabored. Afebrile. . Eyes Nonicteric. Reactive to light. Ears, Nose, Mouth, and Throat Lips, teeth, and gums WNL.Marland Kitchen Moist mucosa without lesions . Neck supple and nontender. No palpable supraclavicular or cervical adenopathy. Normal sized without goiter. Respiratory WNL. No retractions.. Cardiovascular Pedal Pulses WNL. No clubbing, cyanosis or edema. Lymphatic No adneopathy. No adenopathy. No adenopathy. Musculoskeletal Adexa without tenderness or enlargement.. Digits and nails w/o clubbing, cyanosis, infection, petechiae, ischemia, or inflammatory conditions.. Integumentary (Hair, Skin) No suspicious lesions. No crepitus or fluctuance. No peri-wound warmth or erythema. No masses.Marland Kitchen Psychiatric Judgement and insight Intact.. No evidence of depression, anxiety, or agitation.. Notes the right leg is almost completely healed and we will continue with some local care. The area on her back is bleeding quite profusely from her hyper granulation tissue and I had to cauterize this with 3 separate sticks of silver nitrate. Electronic Signature(s) Signed: 06/19/2015 11:03:38 AM By: [name], [name] Entered By: [name] on 06/19/2015 11:03:37 Chubbuck, [name] ([id number]) -------------------------------------------------------------------------------- Physician Orders Details Patient Name: SYLVI, UDO 06/19/2015 10:15 Date of Service: AM Medical Record [id number] Number: Patient Account Number: [id number] [birth year] (59 y.o. Treating RN: Ahmed Prima Date of Birth/Sex: Female) Other  Clinician: Primary Care Physician: Brynda Greathouse, ERNEST Treating Lyndle Pang Referring Physician: [name] Physician/Extender: [name]: 25 Verbal / Phone Orders: Yes Clinician: Carolyne Fiscal, Debi Read Back and Verified: Yes Diagnosis Coding ICD-10 Coding Code Description E11.622 Type 2 diabetes mellitus with other skin ulcer L89.112 Pressure ulcer of right upper back, stage 2 S80.821A Blister (nonthermal), right lower leg, initial encounter N18.6 End stage renal disease Wound Cleansing Wound #  1 Right Back o Clean wound with Normal Saline. Wound #6 Right,Midline Lower Leg o Clean wound with Normal Saline. Skin Barriers/Peri-Wound Care Wound #1 Right Back o Skin Prep Wound #6 Right,Midline Lower Leg o Skin Prep Primary Wound Dressing Wound #1 Right Back o Aquacel Ag Wound #6 Right,Midline Lower Leg o Other: - mepitel lite Secondary Dressing Wound #1 Right Back o Boardered Foam Dressing Dressing Change Frequency Weilbacher, Aliana S. ([id number]) Wound #1 Right Back o Other: - twice a week unless saturated Wound #6 Right,Midline Lower Leg o Change dressing every week - mepitel lite Follow-up Appointments Wound #1 Right Back o Return Appointment in 2 weeks. Wound #6 Right,Midline Lower Leg o Return Appointment in 2 weeks. Electronic Signature(s) Signed: 06/19/2015 4:40:44 PM By: [name], [name] Signed: 06/23/2015 6:00:56 PM By: Alric Quan Entered By: Alric Quan on 06/19/2015 11:10:11 Wronski, [name] ([id number]) -------------------------------------------------------------------------------- Problem List Details Patient Name: DAMIYAH, [name] 06/19/2015 10:15 Date of Service: AM Medical Record [id number] Number: Patient Account Number: [id number] 02-May-1956 (59 y.o. Treating RN: Cornell Barman Date of Birth/Sex: Female) Other Clinician: Primary Care Physician: [name] Treating [name] Referring Physician: [name] Physician/Extender: [name]: 25 Active Problems ICD-10 Encounter Code Description Active Date Diagnosis E11.622 Type 2 diabetes mellitus with other skin ulcer 12/23/2014 Yes L89.112 Pressure ulcer of right upper back, stage 2 12/23/2014 Yes S80.821A Blister (nonthermal), right lower leg, initial encounter 12/23/2014 Yes N18.6 End stage renal disease 12/23/2014 Yes Inactive Problems Resolved Problems Electronic Signature(s) Signed: 06/19/2015 11:01:20 AM By: [name], [name] Entered By: [name] on 06/19/2015 11:01:20 Mcilhenny, [name] ([id number]) -------------------------------------------------------------------------------- Progress Note Details Patient Name: MASHA, [name]. 06/19/2015 10:15 Date of Service: AM Medical Record [id number] Number: Patient Account Number: [id number] October 19, 1955 (59 y.o. Treating RN: Cornell Barman Date of Birth/Sex: Female) Other Clinician: Primary Care Physician: [name] Treating Safina Huard Referring Physician: [name] Physician/Extender: [place]ella [place] in Treatment: 25 Subjective Chief Complaint Information obtained from Patient Patient presents to the wound care center for a consult due non healing wound. She has an open wound on her right upper back which she's had for about a year and she recently noticed a blister on her right lower extremity about 2 weeks ago. History of Present Illness (HPI) The following HPI elements were documented for the patient's wound: Location: right upper back and right lower extremity wounds Quality: Patient reports No Pain. Severity: Patient states wound (s) are getting better. Duration: Patient has had the wound for > 3 months prior to seeking treatment at the wound center Timing: she thought it first occurred when she was using a heating pad about a year ago after back surgery. Context: The wound appeared gradually over time Modifying Factors: Patient is currently on renal dialysis and  receives treatments 3 times weekly Associated Signs and Symptoms: Patient reports having: surgery scheduled for this week for a AV fistula left arm. 59 year old patient who is known to be a diabetic and has end-stage renal disease has had several comorbidities including coronary artery disease, hypertension, hyperlipidemia, pancreatitis, anemia, previous history of hysterectomy, cholecystectomy, left-sided salivary gland excision, bilateral cataract surgery,Peritoneal dialysis catheter, hemodialysis catheter. the area on the back has also been caused by instant pressure she used to sleep on a recliner all day and has significant kyphoscoliosis. As far as the wound on her right lower extremity she's not sure how this blister occurred but she thought it has been there for about 2 weeks. No recent blood investigations  available and no recent hemoglobin A1c. 12/30/2014 -- she is an assisted living facility but I believe the nurses that have not followed instructions as she had some cream applied on her back and there was a different dressing. Last week she's had a AV fistula placed on her left forearm. 01/13/2015 -- she has had some localized infection at the port site and she's been on doxycycline for this. 02/05/2015 - he has developed a small blister on her right lower extremity. 02/10/2015 -- she has developed another small blister on her right anterior chest wall in the area where she's had tape for her dialysis access. this may just be injury caused by a tape burn. JEEYA, DERUYTER ([id number]) 02/18/2015 -- no new blisters and she had a dermatology opinion and they have taken a biopsy of her skin. She also had a left brachial AV fistula placed this week. 03/03/2015 -- though we do not have the pathology report yet the patient says she has been put on prednisone because this skin disease is possibly to do with her immune system and her dermatologist is recommended this. 03/10/2015 -- she has  developed a new blister which is quite large on her right lower extremity on the shin. 05/22/2015 -- she did very well with her dressing but on trying to remove the [place] it peels away the newly healed skin. 06/05/2015 -- the patient was in the ER this past week for severe bleeding from the right nostril and had to have ENT see her and do a packing. They've also been holding her heparin during her dialysis. The patient is going back to ENT today. Other than that she has had no other significant issues. 06/19/2015 -- she is back on her blood thinners and continues to have her hemodialysis as before. Objective Constitutional Pulse regular. Respirations normal and unlabored. Afebrile. Vitals Time Taken: 10:38 AM, Height: 65 in, Weight: 156 lbs, BMI: 26, Temperature: 98.1 F, Pulse: 87 bpm, Respiratory Rate: 18 breaths/min, Blood Pressure: 190/80 mmHg. Eyes Nonicteric. Reactive to light. Ears, Nose, Mouth, and Throat Lips, teeth, and gums WNL.Marland Kitchen Moist mucosa without lesions . Neck supple and nontender. No palpable supraclavicular or cervical adenopathy. Normal sized without goiter. Respiratory WNL. No retractions.. Cardiovascular Pedal Pulses WNL. No clubbing, cyanosis or edema. Lymphatic No adneopathy. No adenopathy. No adenopathy. Musculoskeletal Furio, Cate S. ([id number]) Adexa without tenderness or enlargement.. Digits and nails w/o clubbing, cyanosis, infection, petechiae, ischemia, or inflammatory conditions.Marland Kitchen Psychiatric Judgement and insight Intact.. No evidence of depression, anxiety, or agitation.. General Notes: the right leg is almost completely healed and we will continue with some local care. The area on her back is bleeding quite profusely from her hyper granulation tissue and I had to cauterize this with 3 separate sticks of silver nitrate. Integumentary (Hair, Skin) No suspicious lesions. No crepitus or fluctuance. No peri-wound warmth or erythema. No  masses.. Wound #1 status is Open. Original cause of wound was Blister. The wound is located on the Right Back. The wound measures 4cm length x 3.4cm width x 0.1cm depth; 10.681cm^2 area and 1.068cm^3 volume. The wound is limited to skin breakdown. There is no tunneling or undermining noted. There is a large amount of sanguinous drainage noted. The wound margin is indistinct and nonvisible. There is small (1-33%) red granulation within the wound bed. There is no necrotic tissue within the wound bed. The periwound skin appearance exhibited: Localized Edema, Moist. The periwound skin appearance did not exhibit: Callus, Crepitus, Excoriation, Fluctuance, Friable, Induration,  Rash, Scarring, Dry/Scaly, Maceration, Atrophie Blanche, Cyanosis, Ecchymosis, Hemosiderin Staining, Mottled, Pallor, Rubor, Erythema. Wound #6 status is Open. Original cause of wound was Blister. The wound is located on the Right,Midline Lower Leg. The wound measures 0.5cm length x 0.7cm width x 0.1cm depth; 0.275cm^2 area and 0.027cm^3 volume. The wound is limited to skin breakdown. There is no tunneling or undermining noted. There is a small amount of serous drainage noted. The wound margin is flat and intact. There is large (67- 100%) red granulation within the wound bed. There is no necrotic tissue within the wound bed. The periwound skin appearance exhibited: Moist. The periwound skin appearance did not exhibit: Callus, Crepitus, Excoriation, Fluctuance, Friable, Induration, Localized Edema, Rash, Scarring, Dry/Scaly, Maceration, Atrophie Blanche, Cyanosis, Ecchymosis, Hemosiderin Staining, Mottled, Pallor, Rubor, Erythema. Periwound temperature was noted as No Abnormality. Assessment Active Problems ICD-10 E11.622 - Type 2 diabetes mellitus with other skin ulcer L89.112 - Pressure ulcer of right upper back, stage 2 S80.821A - Blister (nonthermal), right lower leg, initial encounter N18.6 - End stage renal  disease Wellnitz, Mavery S. ([id number]) I have recommended silver alginate to be changed on alternate date for the wound on her back and covered with a bordered foam. We will use of Mepitel light on her right lower extremity and keep this covered for the week. she will come back and see me next week. Procedures Wound #1 Wound #1 is a Pressure Ulcer located on the Right Back . An CHEM CAUT GRANULATION TISS procedure was performed by [name], MD. Post procedure Diagnosis Wound #1: Same as Pre-Procedure Notes: there was excessive hypergranulation tissue which was bleeding profusely and I had to use 3 sticks of silver nitrate before this could be controlled. She will have a Aquacel Ag dressing and some pressure over the bordered foam to help this be controlled. Plan Wound Cleansing: Wound #1 Right Back: Clean wound with Normal Saline. Wound #6 Right,Midline Lower Leg: Clean wound with Normal Saline. Skin Barriers/Peri-Wound Care: Wound #1 Right Back: Skin Prep Wound #6 Right,Midline Lower Leg: Skin Prep Primary Wound Dressing: Wound #1 Right Back: Aquacel Ag Wound #6 Right,Midline Lower Leg: Other: - mepitel lite Secondary Dressing: Wound #1 Right Back: Boardered Foam Dressing Dressing Change Frequency: Wound #1 Right Back: Other: - twice a week unless saturated Wound #6 Right,Midline Lower Leg: Change dressing every week - mepitel lite Chesler, Emma S. ([id number]) Follow-up Appointments: Wound #1 Right Back: Return Appointment in 2 weeks. Wound #6 Right,Midline Lower Leg: Return Appointment in 2 weeks. I have recommended silver alginate to be changed on alternate date for the wound on her back and covered with a bordered foam. We will use of Mepitel light on her right lower extremity and keep this covered for the week. she will come back and see me next week. Electronic Signature(s) Signed: 06/19/2015 3:30:43 PM By: [name], [name] Previous Signature: 06/19/2015  11:04:14 AM Version By: [name], [name] Entered By: [name] on 06/19/2015 15:30:43 Sonier, [name] ([id number]) -------------------------------------------------------------------------------- SuperBill Details Patient Name: [name] Date of Service: 06/19/2015 Medical Record Number: [id number] Patient Account Number: [id number] Date of Birth/Sex: 1955-08-01 (59 y.o. Female) Treating RN: Cornell Barman Primary Care Physician: [name] Other Clinician: Referring Physician: [name] Treating Physician/Extender: Frann Rider in Treatment: 25 Diagnosis Coding ICD-10 Codes Code Description E11.622 Type 2 diabetes mellitus with other skin ulcer L89.112 Pressure ulcer of right upper back, stage 2 S80.821A Blister (nonthermal), right lower leg, initial encounter N18.6 End stage  renal disease Facility Procedures CPT4 Code: [id number] Description: [id number] - CHEM CAUT GRANULATION TISS ICD-10 Description Diagnosis E11.622 Type 2 diabetes mellitus with other skin ulcer L89.112 Pressure ulcer of right upper back, stage 2 S80.821A Blister (nonthermal), right lower leg, initial enco N18.6 End  stage renal disease Modifier: unter Quantity: 1 Physician Procedures CPT4 Code: [id number] Description: [id number] - WC PHYS CHEM CAUT GRAN TISSUE ICD-10 Description Diagnosis E11.622 Type 2 diabetes mellitus with other skin ulcer L89.112 Pressure ulcer of right upper back, stage 2 S80.821A Blister (nonthermal), right lower leg, initial enco N18.6  End stage renal disease Modifier: unter Quantity: 1 Electronic Signature(s) Signed: 06/19/2015 11:04:26 AM By: [name], [name] Entered By: [name] on 06/19/2015 11:04:26

## 2015-06-24 NOTE — Progress Notes (Signed)
RENUKA, VASUDEVAN ([id number]) Visit Report for 06/19/2015 Arrival Information Details Patient Name: TIMYA, QUARTARARO. Date of Service: 06/19/2015 10:15 AM Medical Record Number: [id number] Patient Account Number: [id number] Date of Birth/Sex: 18-Nov-1955 (59 y.o. Female) Treating RN: Carolyne Fiscal, Debi Primary Care Physician: [name] Other Clinician: Referring Physician: [name] Treating Physician/Extender: Frann Rider in Treatment: 25 Visit Information History Since Last Visit All ordered tests and consults were completed: No Patient Arrived: Ambulatory Added or deleted any medications: No Arrival Time: 10:38 Any new allergies or adverse reactions: No Accompanied By: self Had a fall or experienced change in No Transfer Assistance: None activities of daily living that may affect Patient Identification Verified: Yes risk of falls: Secondary Verification Process Yes Signs or symptoms of abuse/neglect since last No Completed: visito Patient Requires Transmission- No Hospitalized since last visit: No Based Precautions: Pain Present Now: No Patient Has Alerts: Yes Patient Alerts: Patient on Blood Thinner ABI R: 1.28 Electronic Signature(s) Signed: 06/23/2015 6:00:56 PM By: Alric Quan Entered By: Alric Quan on 06/19/2015 10:38:39 Taunton, Crissy S. ([id number]) -------------------------------------------------------------------------------- Clinic Level of Care Assessment Details Patient Name: [name] Date of Service: 06/19/2015 10:15 AM Medical Record Number: [id number] Patient Account Number: [id number] Date of Birth/Sex: 1955/07/21 (59 y.o. Female) Treating RN: Carolyne Fiscal, Debi Primary Care Physician: [name] Other Clinician: Referring Physician: [name] Treating Physician/Extender: Frann Rider in Treatment: 25 Clinic Level of Care Assessment Items TOOL 4 Quantity Score []  - Use when only an EandM is performed on FOLLOW-UP  visit 0 ASSESSMENTS - Nursing Assessment / Reassessment []  - Reassessment of Co-morbidities (includes updates in patient status) 0 X - Reassessment of Adherence to Treatment Plan 1 5 ASSESSMENTS - Wound and Skin Assessment / Reassessment []  - Simple Wound Assessment / Reassessment - one wound 0 X - Complex Wound Assessment / Reassessment - multiple wounds 1 5 []  - Dermatologic / Skin Assessment (not related to wound area) 0 ASSESSMENTS - Focused Assessment []  - Circumferential Edema Measurements - multi extremities 0 []  - Nutritional Assessment / Counseling / Intervention 0 []  - Lower Extremity Assessment (monofilament, tuning fork, pulses) 0 []  - Peripheral Arterial Disease Assessment (using hand held doppler) 0 ASSESSMENTS - Ostomy and/or Continence Assessment and Care []  - Incontinence Assessment and Management 0 []  - Ostomy Care Assessment and Management (repouching, etc.) 0 PROCESS - Coordination of Care []  - Simple Patient / Family Education for ongoing care 0 X - Complex (extensive) Patient / Family Education for ongoing care 1 20 []  - Staff obtains Programmer, systems, Records, Test Results / Process Orders 0 []  - Staff telephones HHA, Nursing Homes / Clarify orders / etc 0 []  - Routine Transfer to another Facility (non-emergent condition) 0 Hailu, Ashleen S. ([id number]) []  - Routine Hospital Admission (non-emergent condition) 0 []  - New Admissions / Biomedical engineer / Ordering NPWT, Apligraf, etc. 0 []  - Emergency Hospital Admission (emergent condition) 0 X - Simple Discharge Coordination 1 10 []  - Complex (extensive) Discharge Coordination 0 PROCESS - Special Needs []  - Pediatric / Minor Patient Management 0 []  - Isolation Patient Management 0 []  - Hearing / Language / Visual special needs 0 []  - Assessment of Community assistance (transportation, D/C planning, etc.) 0 []  - Additional assistance / Altered mentation 0 []  - Support Surface(s) Assessment (bed, cushion, seat, etc.)  0 INTERVENTIONS - Wound Cleansing / Measurement []  - Simple Wound Cleansing - one wound 0 X - Complex Wound Cleansing - multiple wounds 1 5 []  - Wound Imaging (photographs -  any number of wounds) 0 []  - Wound Tracing (instead of photographs) 0 []  - Simple Wound Measurement - one wound 0 X - Complex Wound Measurement - multiple wounds 1 5 INTERVENTIONS - Wound Dressings []  - Small Wound Dressing one or multiple wounds 0 X - Medium Wound Dressing one or multiple wounds 1 15 []  - Large Wound Dressing one or multiple wounds 0 []  - Application of Medications - topical 0 []  - Application of Medications - injection 0 INTERVENTIONS - Miscellaneous []  - External ear exam 0 Anstead, Camyra S. ([id number]) []  - Specimen Collection (cultures, biopsies, blood, body fluids, etc.) 0 []  - Specimen(s) / Culture(s) sent or taken to Lab for analysis 0 []  - Patient Transfer (multiple staff / Harrel Lemon Lift / Similar devices) 0 []  - Simple Staple / Suture removal (25 or less) 0 []  - Complex Staple / Suture removal (26 or more) 0 []  - Hypo / Hyperglycemic Management (close monitor of Blood Glucose) 0 []  - Ankle / Brachial Index (ABI) - do not check if billed separately 0 X - Vital Signs 1 5 Has the patient been seen at the hospital within the last three years: Yes Total Score: 70 Level Of Care: New/Established - Level 2 Electronic Signature(s) Signed: 06/23/2015 6:00:56 PM By: Alric Quan Entered By: Alric Quan on 06/19/2015 11:11:24 Kinkade, [name] ([id number]) -------------------------------------------------------------------------------- Encounter Discharge Information Details [name], Astin S. Date of Service: 06/19/2015 10:15 AM Medical Record Number: [id number] Patient Account Number: [id number] Date of Birth/Sex: 1956-04-11 (59 y.o. Female) Treating RN: Carolyne Fiscal, Debi Primary Care Physician: [name] Other Clinician: Referring Physician: [name] Treating  Physician/Extender: Frann Rider in Treatment: 25 Encounter Discharge Information Items Discharge Pain Level: 0 Discharge Condition: Stable Ambulatory Status: Ambulatory Discharge Destination: Nursing Home Transportation: Private Auto Accompanied By: self Schedule Follow-up Appointment: Yes Medication Reconciliation completed and provided to Patient/Care Yes Alizey Noren: Provided on Clinical Summary of Care: 06/19/2015 Form Type Recipient Paper Patient LD Electronic Signature(s) Signed: 06/19/2015 11:15:32 AM By: Ruthine Dose Entered By: Ruthine Dose on 06/19/2015 11:15:32 Zaccone, [name] ([id number]) -------------------------------------------------------------------------------- Lower Extremity Assessment Details Patient Name: Braun, Yazmyn S. Date of Service: 06/19/2015 10:15 AM Medical Record Number: [id number] Patient Account Number: [id number] Date of Birth/Sex: 05-May-1956 (59 y.o. Female) Treating RN: Carolyne Fiscal, Debi Primary Care Physician: [name] Other Clinician: Referring Physician: [name] Treating Physician/Extender: Frann Rider in Treatment: 25 Edema Assessment Assessed: [Left: No] [Right: No] Edema: [Left: Ye] [Right: s] Vascular Assessment Pulses: Posterior Tibial Palpable: [Left:Yes] Dorsalis Pedis Palpable: [Left:Yes] Extremity colors, hair growth, and conditions: Extremity Color: [Left:Normal] Temperature of Extremity: [Left:Warm] Capillary Refill: [Left:< 3 seconds] Electronic Signature(s) Signed: 06/23/2015 6:00:56 PM By: Alric Quan Entered By: Alric Quan on 06/19/2015 10:53:15 Lesch, Tobie S. ([id number]) -------------------------------------------------------------------------------- Multi Wound Chart Details [name], Nainika S. Date of Service: 06/19/2015 10:15 AM Medical Record Number: [id number] Patient Account Number: [id number] Date of Birth/Sex: 07-30-1955 (59 y.o. Female) Treating RN: Carolyne Fiscal,  Debi Primary Care Physician: [name] Other Clinician: Referring Physician: [name] Treating Physician/Extender: Frann Rider in Treatment: 25 Vital Signs Height(in): 65 Pulse(bpm): 87 Weight(lbs): 156 Blood Pressure 190/80 (mmHg): Body Mass Index(BMI): 26 Temperature(F): 98.1 Respiratory Rate 18 (breaths/min): Photos: [1:No Photos] [6:No Photos] [N/A:N/A] Wound Location: [1:Right Back] [6:Right Lower Leg - Midline N/A] Wounding Event: [1:Blister] [6:Blister] [N/A:N/A] Primary Etiology: [1:Pressure Ulcer] [6:Venous Leg Ulcer] [N/A:N/A] Comorbid History: [1:Glaucoma, Anemia, Coronary Artery Disease, Coronary Artery Disease, Hypertension, Type II Diabetes, End Stage Renal Disease, Osteoarthritis, Neuropathy Osteoarthritis, Neuropathy] [  6:Glaucoma, Anemia, Hypertension, Type II Diabetes,  End Stage Renal Disease,] [N/A:N/A] Date Acquired: [1:10/19/2013] [6:05/22/2015] [N/A:N/A] Weeks of Treatment: [1:25] [6:3] [N/A:N/A] Wound Status: [1:Open] [6:Open] [N/A:N/A] Measurements L x W x D 4x3.4x0.1 [6:0.5x0.7x0.1] [N/A:N/A] (cm) Area (cm) : [1:10.681] [6:0.275] [N/A:N/A] Volume (cm) : [1:1.068] [6:0.027] [N/A:N/A] % Reduction in Area: [1:72.20%] [6:85.40%] [N/A:N/A] % Reduction in Volume: 72.20% [6:85.60%] [N/A:N/A] Classification: [1:Category/Stage II] [6:Partial Thickness] [N/A:N/A] HBO Classification: [1:N/A] [6:Grade 1] [N/A:N/A] Exudate Amount: [1:Large] [6:Small] [N/A:N/A] Exudate Type: [1:Sanguinous] [6:Serous] [N/A:N/A] Exudate Color: [1:red] [6:amber] [N/A:N/A] Wound Margin: [1:Indistinct, nonvisible] [6:Flat and Intact] [N/A:N/A] Granulation Amount: [1:Small (1-33%)] [6:Large (67-100%)] [N/A:N/A] Granulation Quality: [1:Red] [6:Red] [N/A:N/A] Necrotic Amount: [1:None Present (0%)] [6:None Present (0%)] [N/A:N/A] Exposed Structures: [1:Fascia: No Fat: No] [6:Fascia: No Fat: No] [N/A:N/A] Tendon: No Tendon: No Muscle: No Muscle: No Joint: No Joint:  No Bone: No Bone: No Limited to Skin Limited to Skin Breakdown Breakdown Epithelialization: None None N/A Periwound Skin Texture: Edema: Yes Edema: No N/A Excoriation: No Excoriation: No Induration: No Induration: No Callus: No Callus: No Crepitus: No Crepitus: No Fluctuance: No Fluctuance: No Friable: No Friable: No Rash: No Rash: No Scarring: No Scarring: No Periwound Skin Moist: Yes Moist: Yes N/A Moisture: Maceration: No Maceration: No Dry/Scaly: No Dry/Scaly: No Periwound Skin Color: Atrophie Blanche: No Atrophie Blanche: No N/A Cyanosis: No Cyanosis: No Ecchymosis: No Ecchymosis: No Erythema: No Erythema: No Hemosiderin Staining: No Hemosiderin Staining: No Mottled: No Mottled: No Pallor: No Pallor: No Rubor: No Rubor: No Temperature: N/A No Abnormality N/A Tenderness on No No N/A Palpation: Wound Preparation: Ulcer Cleansing: Ulcer Cleansing: N/A Rinsed/Irrigated with Rinsed/Irrigated with Saline Saline Topical Anesthetic Topical Anesthetic Applied: Other: lidocaine Applied: Other: lidocaine 4% 4% Treatment Notes Electronic Signature(s) Signed: 06/23/2015 6:00:56 PM By: Alric Quan Entered By: Alric Quan on 06/19/2015 10:52:25 Ayars, [name] ([id number]) -------------------------------------------------------------------------------- [place] Details Patient Name: [name] Date of Service: 06/19/2015 10:15 AM Medical Record Number: [id number] Patient Account Number: [id number] Date of Birth/Sex: 1956/01/15 (59 y.o. Female) Treating RN: Carolyne Fiscal, Debi Primary Care Physician: [name] Other Clinician: Referring Physician: [name] Treating Physician/Extender: Frann Rider in Treatment: 25 Active Inactive Abuse / Safety / Falls / Self Care Management Nursing Diagnoses: Potential for falls Goals: Patient will remain injury free Date Initiated: 12/23/2014 Goal Status:  Active Patient/caregiver will verbalize understanding of skin care regimen Date Initiated: 12/23/2014 Goal Status: Active Patient/caregiver will verbalize/demonstrate measures taken to prevent injury and/or falls Date Initiated: 12/23/2014 Goal Status: Active Patient/caregiver will verbalize/demonstrate understanding of what to do in case of emergency Date Initiated: 12/23/2014 Goal Status: Active Interventions: Assess fall risk on admission and as needed Assess: immobility, friction, shearing, incontinence upon admission and as needed Assess impairment of mobility on admission and as needed per policy Assess self care needs on admission and as needed Provide education on fall prevention Notes: Wound/Skin Impairment Nursing Diagnoses: Impaired tissue integrity Knowledge deficit related to ulceration/compromised skin integrity Goals: Patient/caregiver will verbalize understanding of skin care regimen ROZ, HERZBERGER ([id number]) Date Initiated: 12/23/2014 Goal Status: Active Ulcer/skin breakdown will heal within 14 weeks Date Initiated: 12/23/2014 Goal Status: Active Interventions: Assess patient/caregiver ability to obtain necessary supplies Assess patient/caregiver ability to perform ulcer/skin care regimen upon admission and as needed Assess ulceration(s) every visit Provide education on ulcer and skin care Treatment Activities: Skin care regimen initiated : 06/19/2015 Topical wound management initiated : 06/19/2015 Notes: Electronic Signature(s) Signed: 06/23/2015 6:00:56 PM By: Alric Quan Entered By: Alric Quan on 06/19/2015 10:52:07 Stgermaine,  [name] ([id number]) -------------------------------------------------------------------------------- Pain Assessment Details Patient Name: SHARENDA, CLISHAM. Date of Service: 06/19/2015 10:15 AM Medical Record Number: [id number] Patient Account Number: [id number] Date of Birth/Sex: 11/20/1955 (59 y.o. Female) Treating RN: Carolyne Fiscal,  Debi Primary Care Physician: [name] Other Clinician: Referring Physician: [name] Treating Physician/Extender: Frann Rider in Treatment: 25 Active Problems Location of Pain Severity and Description of Pain Patient Has Paino No Site Locations Pain Management and Medication Current Pain Management: Electronic Signature(s) Signed: 06/23/2015 6:00:56 PM By: Alric Quan Entered By: Alric Quan on 06/19/2015 10:38:45 Creasman, [name] ([id number]) -------------------------------------------------------------------------------- Patient/Caregiver Education Details Patient Name: [name] Date of Service: 06/19/2015 10:15 AM Medical Record Number: [id number] Patient Account Number: [id number] Date of Birth/Gender: [birth year] (59 y.o. Female) Treating RN: Carolyne Fiscal, Debi Primary Care Physician: [name] Other Clinician: Referring Physician: [name] Treating Physician/Extender: Frann Rider in Treatment: 25 Education Assessment Education Provided To: Patient Education Topics Provided Wound/Skin Impairment: Handouts: Other: change dressing as ordered Methods: Demonstration, Explain/Verbal Responses: State content correctly Electronic Signature(s) Signed: 06/23/2015 6:00:56 PM By: Alric Quan Entered By: Alric Quan on 06/19/2015 11:13:04 Goodlow, [name] ([id number]) -------------------------------------------------------------------------------- Wound Assessment Details Patient Name: Gallop, Metztli S. Date of Service: 06/19/2015 10:15 AM Medical Record Number: [id number] Patient Account Number: [id number] Date of Birth/Sex: [birth year] (59 y.o. Female) Treating RN: Carolyne Fiscal, Debi Primary Care Physician: [name] Other Clinician: Referring Physician: [name] Treating Physician/Extender: Frann Rider in Treatment: 25 Wound Status Wound Number: 1 Primary Pressure Ulcer Etiology: Wound Location: Right  Back Wound Open Wounding Event: Blister Status: Date Acquired: 10/19/2013 Comorbid Glaucoma, Anemia, Coronary Artery Weeks Of Treatment: 25 History: Disease, Hypertension, Type II Clustered Wound: No Diabetes, End Stage Renal Disease, Osteoarthritis, Neuropathy Photos Photo Uploaded By: Alric Quan on 06/19/2015 17:03:51 Wound Measurements Length: (cm) 4 Width: (cm) 3.4 Depth: (cm) 0.1 Area: (cm) 10.681 Volume: (cm) 1.068 % Reduction in Area: 72.2% % Reduction in Volume: 72.2% Epithelialization: None Tunneling: No Undermining: No Wound Description Classification: Category/Stage II Wound Margin: Indistinct, nonvisible Exudate Amount: Large Exudate Type: Sanguinous Exudate Color: red Foul Odor After Cleansing: No Wound Bed Granulation Amount: Small (1-33%) Exposed Structure Granulation Quality: Red Fascia Exposed: No Necrotic Amount: None Present (0%) Fat Layer Exposed: No Marcoux, Keyarra S. ([id number]) Tendon Exposed: No Muscle Exposed: No Joint Exposed: No Bone Exposed: No Limited to Skin Breakdown Periwound Skin Texture Texture Color No Abnormalities Noted: No No Abnormalities Noted: No Callus: No Atrophie Blanche: No Crepitus: No Cyanosis: No Excoriation: No Ecchymosis: No Fluctuance: No Erythema: No Friable: No Hemosiderin Staining: No Induration: No Mottled: No Localized Edema: Yes Pallor: No Rash: No Rubor: No Scarring: No Moisture No Abnormalities Noted: No Dry / Scaly: No Maceration: No Moist: Yes Wound Preparation Ulcer Cleansing: Rinsed/Irrigated with Saline Topical Anesthetic Applied: Other: lidocaine 4%, Treatment Notes Wound #1 (Right Back) 1. Cleansed with: Clean wound with Normal Saline 2. Anesthetic Topical Lidocaine 4% cream to wound bed prior to debridement 4. Dressing Applied: Aquacel Ag Other dressing (specify in notes) 5. Secondary Dressing Applied Bordered Foam Dressing Notes mepitel lite Electronic  Signature(s) Signed: 06/23/2015 6:00:56 PM By: Alric Quan Entered By: Alric Quan on 06/19/2015 10:51:18 Mancuso, [name] ([id number]) Silveira, [name] ([id number]) -------------------------------------------------------------------------------- Wound Assessment Details Patient Name: Rumer, Yesenia S. Date of Service: 06/19/2015 10:15 AM Medical Record Number: [id number] Patient Account Number: [id number] Date of Birth/Sex: [birth year] (59 y.o. Female) Treating RN: Carolyne Fiscal, Debi Primary Care Physician: [name] Other Clinician: Referring Physician: [name] Treating Physician/Extender: Con Memos,  Errol [name]: 25 Wound Status Wound Number: 6 Primary Venous Leg Ulcer Etiology: Wound Location: Right Lower Leg - Midline Wound Open Wounding Event: Blister Status: Date Acquired: 05/22/2015 Comorbid Glaucoma, Anemia, Coronary Artery Weeks Of Treatment: 3 History: Disease, Hypertension, Type II Clustered Wound: No Diabetes, End Stage Renal Disease, Osteoarthritis, Neuropathy Photos Photo Uploaded By: Alric Quan on 06/19/2015 17:03:52 Wound Measurements Length: (cm) 0.5 Width: (cm) 0.7 Depth: (cm) 0.1 Area: (cm) 0.275 Volume: (cm) 0.027 % Reduction in Area: 85.4% % Reduction in Volume: 85.6% Epithelialization: None Tunneling: No Undermining: No Wound Description Classification: Partial Thickness Diabetic Severity (Wagner): Grade 1 Wound Margin: Flat and Intact Exudate Amount: Small Exudate Type: Serous Exudate Color: amber Wound Bed Granulation Amount: Large (67-100%) Exposed Structure Granulation Quality: Red Fascia Exposed: No Lamorte, Kahlee S. ([id number]) Necrotic Amount: None Present (0%) Fat Layer Exposed: No Tendon Exposed: No Muscle Exposed: No Joint Exposed: No Bone Exposed: No Limited to Skin Breakdown Periwound Skin Texture Texture Color No Abnormalities Noted: No No Abnormalities Noted: No Callus: No Atrophie Blanche:  No Crepitus: No Cyanosis: No Excoriation: No Ecchymosis: No Fluctuance: No Erythema: No Friable: No Hemosiderin Staining: No Induration: No Mottled: No Localized Edema: No Pallor: No Rash: No Rubor: No Scarring: No Temperature / Pain Moisture Temperature: No Abnormality No Abnormalities Noted: No Dry / Scaly: No Maceration: No Moist: Yes Wound Preparation Ulcer Cleansing: Rinsed/Irrigated with Saline Topical Anesthetic Applied: Other: lidocaine 4%, Treatment Notes Wound #6 (Right, Midline Lower Leg) 1. Cleansed with: Clean wound with Normal Saline 2. Anesthetic Topical Lidocaine 4% cream to wound bed prior to debridement 4. Dressing Applied: Aquacel Ag Other dressing (specify in notes) 5. Secondary Dressing Applied Bordered Foam Dressing Notes mepitel lite Electronic Signature(s) Signed: 06/23/2015 6:00:56 PM By: Alric Quan Entered By: Alric Quan on 06/19/2015 10:51:56 Baucum, [name] ([id number]) Castrillon, [name] ([id number]) -------------------------------------------------------------------------------- [place] Details Patient Name: Abigail Miyamoto, 65. Date of Service: 06/19/2015 10:15 AM Medical Record Number: [id number] Patient Account Number: [id number] Date of Birth/Sex: 1955-12-01 (59 y.o. Female) Treating RN: Carolyne Fiscal, Debi Primary Care Physician: [name] Other Clinician: Referring Physician: [name] Treating Physician/Extender: Frann Rider in Treatment: 25 Vital Signs Time Taken: 10:38 Temperature (F): 98.1 Height (in): 65 Pulse (bpm): 87 Weight (lbs): 156 Respiratory Rate (breaths/min): 18 Body Mass Index (BMI): 26 Blood Pressure (mmHg): 190/80 Reference Range: 80 - 120 mg / dl Electronic Signature(s) Signed: 06/23/2015 6:00:56 PM By: Alric Quan Entered By: Alric Quan on 06/19/2015 10:43:02

## 2015-06-26 ENCOUNTER — Encounter: Payer: Medicare Other | Admitting: Surgery

## 2015-06-26 DIAGNOSIS — E11622 Type 2 diabetes mellitus with other skin ulcer: Secondary | ICD-10-CM | POA: Diagnosis not present

## 2015-06-27 NOTE — Progress Notes (Signed)
[name], [name] ([id number]) Visit Report for 06/26/2015 Chief Complaint Document Details [name], [name]. Date of Service: 06/26/2015 9:00 AM Medical Record Number: [id number] Patient Account Number: [id number] Date of Birth/Sex: 03/15/1956 (59 y.o. Female) Treating RN: [name] Primary Care Physician: [name] Other Clinician: Referring Physician: Nicky [name] Treating Physician/Extender: [name] in Treatment: 26 Information Obtained from: Patient Chief Complaint Patient presents to the wound care center for a consult due non healing wound. She has an open wound on her right upper back which she's had for about a year and she recently noticed a blister on her right lower extremity about 2 weeks ago. Electronic Signature(s) Signed: 06/26/2015 9:43:19 AM By: [name] Fudge MD, [name] Entered By: [name] on 06/26/2015 09:43:19 [name], [name] ([id number]) -------------------------------------------------------------------------------- HPI Details [name]. Date of Service: 06/26/2015 9:00 AM Medical Record Number: [id number] Patient Account Number: [id number] Date of Birth/Sex: [birth year] (59 y.o. Female) Treating RN: [name] Primary Care Physician: [name] Other Clinician: Referring Physician: Nicky [name] Treating Physician/Extender: [name] in Treatment: 26 History of Present Illness Location: right upper back and right lower extremity wounds Quality: Patient reports No Pain. Severity: Patient states wound (s) are getting better. Duration: Patient has had the wound for > 3 months prior to seeking treatment at the wound center Timing: she thought it first occurred when she was using a heating pad about a year ago after back surgery. Context: The wound appeared gradually over time Modifying Factors: Patient is currently on renal dialysis and receives treatments 3 times weekly Associated Signs and Symptoms:  Patient reports having: surgery scheduled for this week for a AV fistula left arm. HPI Description: 59 year old patient who is known to be a diabetic and has end-stage renal disease has had several comorbidities including coronary artery disease, hypertension, hyperlipidemia, pancreatitis, anemia, previous history of hysterectomy, cholecystectomy, left-sided salivary gland excision, bilateral cataract surgery,Peritoneal dialysis catheter, hemodialysis catheter. the area on the back has also been caused by instant pressure she used to sleep on a recliner all day and has significant kyphoscoliosis. As far as the wound on her right lower extremity she's not sure how this blister occurred but she thought it has been there for about 2 weeks. No recent blood investigations available and no recent hemoglobin A1c. 12/30/2014 -- she is an assisted living facility but I believe the nurses that have not followed instructions as she had some cream applied on her back and there was a different dressing. Last week she's had a AV fistula placed on her left forearm. 01/13/2015 -- she has had some localized infection at the port site and she's been on doxycycline for this. 02/05/2015 - he has developed a small blister on her right lower extremity. 02/10/2015 -- she has developed another small blister on her right anterior chest wall in the area where she's had tape for her dialysis access. this may just be injury caused by a tape burn. 02/18/2015 -- no new blisters and she had a dermatology opinion and they have taken a biopsy of her skin. She also had a left brachial AV fistula placed this week. 03/03/2015 -- though we do not have the pathology report yet the patient says she has been put on prednisone because this skin disease is possibly to do with her immune system and her dermatologist is recommended this. 03/10/2015 -- she has developed a new blister which is quite large on her right lower extremity on  the shin.  05/22/2015 -- she did very well with her dressing but on trying to remove the [place] it peels away the newly healed skin. [name] ([id number]) 06/05/2015 -- the patient was in the ER this past week for severe bleeding from the right nostril and had to have ENT see her and do a packing. They've also been holding her heparin during her dialysis. The patient is going back to ENT today. Other than that she has had no other significant issues. 06/19/2015 -- she is back on her blood thinners and continues to have her hemodialysis as before. Electronic Signature(s) Signed: 06/26/2015 9:43:23 AM By: [name] Fudge MD, [name] Entered By: [name] on 06/26/2015 09:43:23 [name], [name] ([id number]) -------------------------------------------------------------------------------- Physical Exam Details [name], [name]. Date of Service: 06/26/2015 9:00 AM Medical Record Number: [id number] Patient Account Number: [id number] Date of Birth/Sex: [birth year] (59 y.o. Female) Treating RN: [name] Primary Care Physician: [name] Other Clinician: Referring Physician: Nicky [name] Treating Physician/Extender: [name] in Treatment: 26 Constitutional . Pulse regular. Respirations normal and unlabored. Afebrile. . Eyes Nonicteric. Reactive to light. Ears, Nose, Mouth, and Throat Lips, teeth, and gums WNL.Marland Kitchen Moist mucosa without lesions . Neck supple and nontender. No palpable supraclavicular or cervical adenopathy. Normal sized without goiter. Respiratory WNL. No retractions.. Cardiovascular Pedal Pulses WNL. No clubbing, cyanosis or edema. Lymphatic No adneopathy. No adenopathy. No adenopathy. Musculoskeletal Adexa without tenderness or enlargement.. Digits and nails w/o clubbing, cyanosis, infection, petechiae, ischemia, or inflammatory conditions.. Integumentary (Hair, Skin) No suspicious lesions. No crepitus or fluctuance. No peri-wound  warmth or erythema. No masses.Marland Kitchen Psychiatric Judgement and insight Intact.. No evidence of depression, anxiety, or agitation.. Notes The right leg is healed. The lesion on the back is looking much cleaner less hyper granulation tissue and overall has improved tremendously. Electronic Signature(s) Signed: 06/26/2015 9:44:09 AM By: [name] Fudge MD, [name] Entered By: [name] on 06/26/2015 09:44:09 Ellers, [name] ([id number]) -------------------------------------------------------------------------------- Physician Orders Details [name]. Date of Service: 06/26/2015 9:00 AM Medical Record Number: [id number] Patient Account Number: [id number] Date of Birth/Sex: 21-Sep-1955 (59 y.o. Female) Treating RN: [name] Primary Care Physician: [name] Other Clinician: Referring Physician: Nicky [name] Treating Physician/Extender: [name] in Treatment: 56 Verbal / Phone Orders: No Diagnosis Coding Wound Cleansing Wound #1 Right Back o Clean wound with Normal Saline. Skin Barriers/Peri-Wound Care Wound #1 Right Back o Skin Prep Primary Wound Dressing Wound #1 Right Back o Aquacel Ag - Applied in clinic. Order RTD dressing for use at [facility]. Secondary Dressing Wound #1 Right Back o Boardered Foam Dressing Dressing Change Frequency Wound #1 Right Back o Other: - twice a week unless saturated Follow-up Appointments Wound #1 Right Back o Return Appointment in 2 weeks. Electronic Signature(s) Signed: 06/26/2015 3:49:12 PM By: Gretta Cool RN, [name], Kim RN, [name] Signed: 06/26/2015 4:13:31 PM By: [name] Fudge MD, [name] Entered By: Gretta Cool RN, [name], Kim on 06/26/2015 09:37:14 [name], [name] ([id number]) -------------------------------------------------------------------------------- Problem List Details Patient Name: DAIJANAE, ANGELOPOULOS. Date of Service: 06/26/2015 9:00 AM Medical Record Number: [id number] Patient Account Number:  [id number] Date of Birth/Sex: [birth year] (59 y.o. Female) Treating RN: [name] Primary Care Physician: [name] Other Clinician: Referring Physician: Nicky [name] Treating Physician/Extender: [name] in Treatment: 26 Active Problems ICD-10 Encounter Code Description Active Date Diagnosis E11.622 Type 2 diabetes mellitus with other skin ulcer 12/23/2014 Yes L89.112 Pressure ulcer of right upper back, stage 2 12/23/2014 Yes S80.821A Blister (nonthermal), right lower  leg, initial encounter 12/23/2014 Yes N18.6 End stage renal disease 12/23/2014 Yes Inactive Problems Resolved Problems Electronic Signature(s) Signed: 06/26/2015 9:43:13 AM By: [name] Fudge MD, [name] Entered By: [name] on 06/26/2015 09:43:13 [name], [name] ([id number]) -------------------------------------------------------------------------------- Progress Note Details [name], [name] Date of Service: 06/26/2015 9:00 AM Medical Record Number: [id number] Patient Account Number: [id number] Date of Birth/Sex: 01-08-1956 (59 y.o. Female) Treating RN: [name] Primary Care Physician: [name] Other Clinician: Referring Physician: Nicky [name] Treating Physician/Extender: [name] in Treatment: 26 Subjective Chief Complaint Information obtained from Patient Patient presents to the wound care center for a consult due non healing wound. She has an open wound on her right upper back which she's had for about a year and she recently noticed a blister on her right lower extremity about 2 weeks ago. History of Present Illness (HPI) The following HPI elements were documented for the patient's wound: Location: right upper back and right lower extremity wounds Quality: Patient reports No Pain. Severity: Patient states wound (s) are getting better. Duration: Patient has had the wound for > 3 months prior to seeking treatment at the wound center Timing: she thought it first  occurred when she was using a heating pad about a year ago after back surgery. Context: The wound appeared gradually over time Modifying Factors: Patient is currently on renal dialysis and receives treatments 3 times weekly Associated Signs and Symptoms: Patient reports having: surgery scheduled for this week for a AV fistula left arm. 59 year old patient who is known to be a diabetic and has end-stage renal disease has had several comorbidities including coronary artery disease, hypertension, hyperlipidemia, pancreatitis, anemia, previous history of hysterectomy, cholecystectomy, left-sided salivary gland excision, bilateral cataract surgery,Peritoneal dialysis catheter, hemodialysis catheter. the area on the back has also been caused by instant pressure she used to sleep on a recliner all day and has significant kyphoscoliosis. As far as the wound on her right lower extremity she's not sure how this blister occurred but she thought it has been there for about 2 weeks. No recent blood investigations available and no recent hemoglobin A1c. 12/30/2014 -- she is an assisted living facility but I believe the nurses that have not followed instructions as she had some cream applied on her back and there was a different dressing. Last week she's had a AV fistula placed on her left forearm. 01/13/2015 -- she has had some localized infection at the port site and she's been on doxycycline for this. 02/05/2015 - he has developed a small blister on her right lower extremity. 02/10/2015 -- she has developed another small blister on her right anterior chest wall in the area where she's had tape for her dialysis access. this may just be injury caused by a tape burn. 02/18/2015 -- no new blisters and she had a dermatology opinion and they have taken a biopsy of her skin. [name], [name] ([id number]) She also had a left brachial AV fistula placed this week. 03/03/2015 -- though we do not have the pathology  report yet the patient says she has been put on prednisone because this skin disease is possibly to do with her immune system and her dermatologist is recommended this. 03/10/2015 -- she has developed a new blister which is quite large on her right lower extremity on the shin. 05/22/2015 -- she did very well with her dressing but on trying to remove the [place] it peels away the newly healed skin. 06/05/2015 -- the patient was in  the ER this past week for severe bleeding from the right nostril and had to have ENT see her and do a packing. They've also been holding her heparin during her dialysis. The patient is going back to ENT today. Other than that she has had no other significant issues. 06/19/2015 -- she is back on her blood thinners and continues to have her hemodialysis as before. Objective Constitutional Pulse regular. Respirations normal and unlabored. Afebrile. Vitals Time Taken: 9:[name] AM, Height: 65 in, Weight: 156 lbs, BMI: 26, Temperature: 97.9 F, Pulse: 86 bpm, Respiratory Rate: 18 breaths/min, Blood Pressure: 176/79 mmHg. Eyes Nonicteric. Reactive to light. Ears, Nose, Mouth, and Throat Lips, teeth, and gums WNL.Marland Kitchen Moist mucosa without lesions . Neck supple and nontender. No palpable supraclavicular or cervical adenopathy. Normal sized without goiter. Respiratory WNL. No retractions.. Cardiovascular Pedal Pulses WNL. No clubbing, cyanosis or edema. Lymphatic No adneopathy. No adenopathy. No adenopathy. Musculoskeletal Adexa without tenderness or enlargement.. Digits and nails w/o clubbing, cyanosis, infection, petechiae, ischemia, or inflammatory conditions.[name] ([id number]) Psychiatric Judgement and insight Intact.. No evidence of depression, anxiety, or agitation.. General Notes: The right leg is healed. The lesion on the back is looking much cleaner less hyper granulation tissue and overall has improved tremendously. Integumentary (Hair,  Skin) No suspicious lesions. No crepitus or fluctuance. No peri-wound warmth or erythema. No masses.. Wound #1 status is Open. Original cause of wound was Blister. The wound is located on the Right Back. The wound measures 3.2cm length x 2.5cm width x 0.1cm depth; 6.283cm^2 area and 0.628cm^3 volume. The wound is limited to skin breakdown. There is a large amount of sanguinous drainage noted. The wound margin is indistinct and nonvisible. There is small (1-33%) red, friable granulation within the wound bed. There is no necrotic tissue within the wound bed. The periwound skin appearance exhibited: Localized Edema, Moist. The periwound skin appearance did not exhibit: Callus, Crepitus, Excoriation, Fluctuance, Friable, Induration, Rash, Scarring, Dry/Scaly, Maceration, Atrophie Blanche, Cyanosis, Ecchymosis, Hemosiderin Staining, Mottled, Pallor, Rubor, Erythema. Wound #6 status is Open. Original cause of wound was Blister. The wound is located on the Right,Midline Lower Leg. The wound measures 0cm length x 0cm width x 0cm depth; 0cm^2 area and 0cm^3 volume. Assessment Active Problems ICD-10 E11.622 - Type 2 diabetes mellitus with other skin ulcer L89.112 - Pressure ulcer of right upper back, stage 2 S80.821A - Blister (nonthermal), right lower leg, initial encounter N18.6 - End stage renal disease I would like the patient to have RTD form be applied over this area on her back and we will order this for the nursing home to get. He'll then it is okay for them to use Aquacel Ag or Acticoat as available and change it 2 or 3 times a week as necessary with a secondary dressing to absorb the fluid. She will come back and see me next week. Plan [name], [name] ([id number]) Wound Cleansing: Wound #1 Right Back: Clean wound with Normal Saline. Skin Barriers/Peri-Wound Care: Wound #1 Right Back: Skin Prep Primary Wound Dressing: Wound #1 Right Back: Aquacel Ag - Applied in clinic. Order RTD  dressing for use at [facility]. Secondary Dressing: Wound #1 Right Back: Boardered Foam Dressing Dressing Change Frequency: Wound #1 Right Back: Other: - twice a week unless saturated Follow-up Appointments: Wound #1 Right Back: Return Appointment in 2 weeks. I would like the patient to have RTD form be applied over this area on her back and we will order this for the nursing home  to get. He'll then it is okay for them to use Aquacel Ag or Acticoat as available and change it 2 or 3 times a week as necessary with a secondary dressing to absorb the fluid. She will come back and see me next week. Electronic Signature(s) Signed: 06/26/2015 9:45:25 AM By: [name] Fudge MD, [name] Previous Signature: 06/26/2015 9:45:14 AM Version By: [name] Fudge MD, [name] Entered By: [name] on 06/26/2015 09:45:25 [name], [name] ([id number]) -------------------------------------------------------------------------------- SuperBill Details [name]. Date of Service: 06/26/2015 Medical Record Number: [id number] Patient Account Number: [id number] Date of Birth/Sex: May 09, 1956 (59 y.o. Female) Treating RN: [name] Primary Care Physician: [name] Other Clinician: Referring Physician: Nicky [name] Treating Physician/Extender: [name] in Treatment: 26 Diagnosis Coding ICD-10 Codes Code Description E11.622 Type 2 diabetes mellitus with other skin ulcer L89.112 Pressure ulcer of right upper back, stage 2 S80.821A Blister (nonthermal), right lower leg, initial encounter N18.6 End stage renal disease Facility Procedures CPT4 Code: [id number] Description: [id number] - WOUND CARE VISIT-LEV 2 EST PT Modifier: Quantity: 1 Physician Procedures CPT4 Code: [id number] Description: [id number] - WC PHYS LEVEL 3 - EST PT ICD-10 Description Diagnosis E11.622 Type 2 diabetes mellitus with other skin ulcer L89.112 Pressure ulcer of right upper back, stage 2 S80.821A Blister  (nonthermal), right lower leg, initial enc N18.6 End  stage renal disease Modifier: ounter Quantity: 1 Electronic Signature(s) Signed: 06/26/2015 9:45:40 AM By: [name] Fudge MD, [name] Entered By: [name] on 06/26/2015 09:45:39

## 2015-06-27 NOTE — Progress Notes (Signed)
[name] ([id number]) Visit Report for 06/26/2015 Arrival Information Details [name]. Date of Service: 06/26/2015 9:00 AM Medical Record Number: [id number] Patient Account Number: [id number] Date of Birth/Sex: Dec 15, 1955 (59 y.o. Female) Treating RN: [name] Primary Care Physician: [name] Other Clinician: Referring Physician: Nicky [name] Treating Physician/Extender: [name] in Treatment: 3 Visit Information History Since Last Visit Added or deleted any medications: Yes Patient Arrived: Ambulatory Any new allergies or adverse reactions: No Arrival Time: 09:19 Had a fall or experienced change in No Accompanied By: self activities of daily living that may affect Transfer Assistance: None risk of falls: Patient Identification Verified: Yes Signs or symptoms of abuse/neglect since last No Secondary Verification Process Yes visito Completed: Hospitalized since last visit: No Patient Requires Transmission- No Has Dressing in Place as Prescribed: Yes Based Precautions: Pain Present Now: No Patient Has Alerts: Yes Patient Alerts: Patient on Blood Thinner ABI R: 1.28 Electronic Signature(s) Signed: 06/26/2015 3:49:12 PM By: [name] Cool, RN, [name], Kim RN, [name] Entered By: [name] Cool, RN, [name], [name] on 06/26/2015 09:20:20 [name] ([id number]) -------------------------------------------------------------------------------- Clinic Level of Care Assessment Details [name]. Date of Service: 06/26/2015 9:00 AM Medical Record Number: [id number] Patient Account Number: [id number] Date of Birth/Sex: 1955-11-29 (59 y.o. Female) Treating RN: [name] Primary Care Physician: [name] Other Clinician: Referring Physician: Nicky [name] Treating Physician/Extender: [name] in Treatment: 26 Clinic Level of Care Assessment Items TOOL 4 Quantity Score []  - Use when only an EandM is performed on FOLLOW-UP visit  0 ASSESSMENTS - Nursing Assessment / Reassessment []  - Reassessment of Co-morbidities (includes updates in patient status) 0 X - Reassessment of Adherence to Treatment Plan 1 5 ASSESSMENTS - Wound and Skin Assessment / Reassessment X - Simple Wound Assessment / Reassessment - one wound 1 5 []  - Complex Wound Assessment / Reassessment - multiple wounds 0 []  - Dermatologic / Skin Assessment (not related to wound area) 0 ASSESSMENTS - Focused Assessment []  - Circumferential Edema Measurements - multi extremities 0 []  - Nutritional Assessment / Counseling / Intervention 0 []  - Lower Extremity Assessment (monofilament, tuning fork, pulses) 0 []  - Peripheral Arterial Disease Assessment (using hand held doppler) 0 ASSESSMENTS - Ostomy and/or Continence Assessment and Care []  - Incontinence Assessment and Management 0 []  - Ostomy Care Assessment and Management (repouching, etc.) 0 PROCESS - Coordination of Care X - Simple Patient / Family Education for ongoing care 1 15 []  - Complex (extensive) Patient / Family Education for ongoing care 0 X - Staff obtains Programmer, systems, Records, Test Results / Process Orders 1 10 []  - Staff telephones HHA, Nursing Homes / Clarify orders / etc 0 []  - Routine Transfer to another Facility (non-emergent condition) 0 Pollok, Allyson S. ([id number]) []  - Routine Hospital Admission (non-emergent condition) 0 []  - New Admissions / Biomedical engineer / Ordering NPWT, Apligraf, etc. 0 []  - Emergency Hospital Admission (emergent condition) 0 X - Simple Discharge Coordination 1 10 []  - Complex (extensive) Discharge Coordination 0 PROCESS - Special Needs []  - Pediatric / Minor Patient Management 0 []  - Isolation Patient Management 0 []  - Hearing / Language / Visual special needs 0 []  - Assessment of Community assistance (transportation, D/C planning, etc.) 0 []  - Additional assistance / Altered mentation 0 []  - Support Surface(s) Assessment (bed, cushion, seat, etc.)  0 INTERVENTIONS - Wound Cleansing / Measurement X - Simple Wound Cleansing - one wound 1 5 []  - Complex Wound Cleansing - multiple wounds  0 X - Wound Imaging (photographs - any number of wounds) 1 5 []  - Wound Tracing (instead of photographs) 0 X - Simple Wound Measurement - one wound 1 5 []  - Complex Wound Measurement - multiple wounds 0 INTERVENTIONS - Wound Dressings []  - Small Wound Dressing one or multiple wounds 0 []  - Medium Wound Dressing one or multiple wounds 0 []  - Large Wound Dressing one or multiple wounds 0 []  - Application of Medications - topical 0 []  - Application of Medications - injection 0 INTERVENTIONS - Miscellaneous []  - External ear exam 0 Hunnicutt, Brodie S. ([id number]) []  - Specimen Collection (cultures, biopsies, blood, body fluids, etc.) 0 []  - Specimen(s) / Culture(s) sent or taken to Lab for analysis 0 []  - Patient Transfer (multiple staff / Harrel Lemon Lift / Similar devices) 0 []  - Simple Staple / Suture removal (25 or less) 0 []  - Complex Staple / Suture removal (26 or more) 0 []  - Hypo / Hyperglycemic Management (close monitor of Blood Glucose) 0 []  - Ankle / Brachial Index (ABI) - do not check if billed separately 0 X - Vital Signs 1 5 Has the patient been seen at the hospital within the last three years: Yes Total Score: 65 Level Of Care: New/Established - Level 2 Electronic Signature(s) Signed: 06/26/2015 3:49:12 PM By: [name] Cool, RN, [name], Kim RN, [name] Entered By: [name] Cool, RN, [name], [name] on 06/26/2015 09:41:45 [name] ([id number]) -------------------------------------------------------------------------------- Encounter Discharge Information Details [name]. Date of Service: 06/26/2015 9:00 AM Medical Record Number: [id number] Patient Account Number: [id number] Date of Birth/Sex: 1956/06/27 (59 y.o. Female) Treating RN: [name] Primary Care Physician: [name] Other Clinician: Referring Physician: Nicky [name] Treating  Physician/Extender: [name] in Treatment: 26 Encounter Discharge Information Items Schedule Follow-up Appointment: No Medication Reconciliation completed No and provided to Patient/Care Aimy Sweeting: Provided on Clinical Summary of Care: 06/26/2015 Form Type Recipient Paper Patient LD Electronic Signature(s) Signed: 06/26/2015 9:42:14 AM By: Ruthine Dose Entered By: Ruthine Dose on 06/26/2015 09:42:14 [name] ([id number]) -------------------------------------------------------------------------------- Lower Extremity Assessment Details Patient Name: Courtwright, Onyinyechi S. Date of Service: 06/26/2015 9:00 AM Medical Record Number: [id number] Patient Account Number: [id number] Date of Birth/Sex: [birth year] (59 y.o. Female) Treating RN: [name] Primary Care Physician: [name] Other Clinician: Referring Physician: Nicky [name] Treating Physician/Extender: [name] in Treatment: 26 Electronic Signature(s) Signed: 06/26/2015 3:49:12 PM By: [name] Cool, RN, [name], Kim RN, [name] Entered By: [name] Cool, RN, [name], [name] on 06/26/2015 09:24:17 [name] ([id number]) -------------------------------------------------------------------------------- Multi Wound Chart Details Patient Name: ZORINA, STAHLEY. Date of Service: 06/26/2015 9:00 AM Medical Record Number: [id number] Patient Account Number: [id number] Date of Birth/Sex: [birth year] (59 y.o. Female) Treating RN: [name] Primary Care Physician: [name] Other Clinician: Referring Physician: Nicky [name] Treating Physician/Extender: [name] in Treatment: 26 Vital Signs Height(in): 65 Pulse(bpm): 86 Weight(lbs): 156 Blood Pressure 176/79 (mmHg): Body Mass Index(BMI): 26 Temperature(F): 97.9 Respiratory Rate 18 (breaths/min): Photos: [1:No Photos] [6:No Photos] [N/A:N/A] Wound Location: [1:Right Back] [6:Right, Midline Lower Leg] [N/A:N/A] Wounding Event: [1:Blister] [6:Blister]  [N/A:N/A] Primary Etiology: [1:Pressure Ulcer] [6:Venous Leg Ulcer] [N/A:N/A] Comorbid History: [1:Glaucoma, Anemia, Coronary Artery Disease, Hypertension, Type II Diabetes, End Stage Renal Disease, Osteoarthritis, Neuropathy] [6:N/A] [N/A:N/A] Date Acquired: [1:10/19/2013] [6:05/22/2015] [N/A:N/A] Weeks of Treatment: [1:26] [6:4] [N/A:N/A] Wound Status: [1:Open] [6:Open] [N/A:N/A] Measurements L x W x D 3.2x2.5x0.1 [6:0x0x0] [N/A:N/A] (cm) Area (cm) : [1:6.283] [6:0] [N/A:N/A] Volume (cm) : [1:0.628] [6:0] [N/A:N/A] % Reduction in Area: [1:83.70%] [6:100.00%] [N/A:N/A] %  Reduction in Volume: 83.70% [6:100.00%] [N/A:N/A] Classification: [1:Category/Stage II] [6:Partial Thickness] [N/A:N/A] Exudate Amount: [1:Large] [6:N/A] [N/A:N/A] Exudate Type: [1:Sanguinous] [6:N/A] [N/A:N/A] Exudate Color: [1:red] [6:N/A] [N/A:N/A] Wound Margin: [1:Indistinct, nonvisible] [6:N/A] [N/A:N/A] Granulation Amount: [1:Small (1-33%)] [6:N/A] [N/A:N/A] Granulation Quality: [1:Red, Friable] [6:N/A] [N/A:N/A] Necrotic Amount: [1:None Present (0%)] [6:N/A] [N/A:N/A] Exposed Structures: [1:Fascia: No Fat: No Tendon: No] [6:N/A] [N/A:N/A] Muscle: No Joint: No Bone: No Limited to Skin Breakdown Epithelialization: Medium (34-66%) N/A N/A Periwound Skin Texture: Edema: Yes No Abnormalities Noted N/A Excoriation: No Induration: No Callus: No Crepitus: No Fluctuance: No Friable: No Rash: No Scarring: No Periwound Skin Moist: Yes No Abnormalities Noted N/A Moisture: Maceration: No Dry/Scaly: No Periwound Skin Color: Atrophie Blanche: No No Abnormalities Noted N/A Cyanosis: No Ecchymosis: No Erythema: No Hemosiderin Staining: No Mottled: No Pallor: No Rubor: No Tenderness on No No N/A Palpation: Wound Preparation: Ulcer Cleansing: N/A N/A Rinsed/Irrigated with Saline Topical Anesthetic Applied: Other: lidocaine 4% Treatment Notes Electronic Signature(s) Signed: 06/26/2015 3:49:12 PM By:  [name] Cool, RN, [name], Kim RN, [name] Entered By: [name] Cool, RN, [name], [name] on 06/26/2015 09:36:03 [name] ([id number]) -------------------------------------------------------------------------------- Multi-Disciplinary Care Plan Details Patient Name: BRYTNEY, MEINDL. Date of Service: 06/26/2015 9:00 AM Medical Record Number: [id number] Patient Account Number: [id number] Date of Birth/Sex: [birth year] (59 y.o. Female) Treating RN: [name] Primary Care Physician: [name] Other Clinician: Referring Physician: Nicky [name] Treating Physician/Extender: [name] in Treatment: 56 Active Inactive Abuse / Safety / Falls / Self Care Management Nursing Diagnoses: Potential for falls Goals: Patient will remain injury free Date Initiated: 12/23/2014 Goal Status: Active Patient/caregiver will verbalize understanding of skin care regimen Date Initiated: 12/23/2014 Goal Status: Active Patient/caregiver will verbalize/demonstrate measures taken to prevent injury and/or falls Date Initiated: 12/23/2014 Goal Status: Active Patient/caregiver will verbalize/demonstrate understanding of what to do in case of emergency Date Initiated: 12/23/2014 Goal Status: Active Interventions: Assess fall risk on admission and as needed Assess: immobility, friction, shearing, incontinence upon admission and as needed Assess impairment of mobility on admission and as needed per policy Assess self care needs on admission and as needed Provide education on fall prevention Notes: Wound/Skin Impairment Nursing Diagnoses: Impaired tissue integrity Knowledge deficit related to ulceration/compromised skin integrity Goals: Patient/caregiver will verbalize understanding of skin care regimen SUSEN, RANZ ([id number]) Date Initiated: 12/23/2014 Goal Status: Active Ulcer/skin breakdown will heal within 14 weeks Date Initiated: 12/23/2014 Goal Status: Active Interventions: Assess patient/caregiver ability to obtain  necessary supplies Assess patient/caregiver ability to perform ulcer/skin care regimen upon admission and as needed Assess ulceration(s) every visit Provide education on ulcer and skin care Treatment Activities: Skin care regimen initiated : 06/26/2015 Topical wound management initiated : 06/26/2015 Notes: Electronic Signature(s) Signed: 06/26/2015 3:49:12 PM By: [name] Cool, RN, [name], Kim RN, [name] Entered By: [name] Cool, RN, [name], [name] on 06/26/2015 09:35:57 [name] ([id number]) -------------------------------------------------------------------------------- Pain Assessment Details [name]. Date of Service: 06/26/2015 9:00 AM Medical Record Number: [id number] Patient Account Number: [id number] Date of Birth/Sex: 26-Jun-1956 (59 y.o. Female) Treating RN: [name] Primary Care Physician: [name] Other Clinician: Referring Physician: Nicky [name] Treating Physician/Extender: [name] in Treatment: 26 Active Problems Location of Pain Severity and Description of Pain Patient Has Paino No Site Locations Pain Management and Medication Current Pain Management: Electronic Signature(s) Signed: 06/26/2015 3:49:12 PM By: [name] Cool, RN, [name], Kim RN, [name] Entered By: [name] Cool, RN, [name], [name] on 06/26/2015 09:20:33 [name] ([id number]) -------------------------------------------------------------------------------- Patient/Caregiver Education Details [name]. Date of Service: 06/26/2015  9:00 AM Medical Record Number: [id number] Patient Account Number: [id number] Date of Birth/Gender: [birth year] (59 y.o. Female) Treating RN: [name] Primary Care Physician: [name] Other Clinician: Referring Physician: Nicky [name] Treating Physician/Extender: [name] in Treatment: 10 Education Assessment Education Provided To: Patient Education Topics Provided Wound/Skin Impairment: Handouts: Caring for Your Ulcer, Other: continue  wound care as prescribed Methods: Demonstration Responses: State content correctly Electronic Signature(s) Signed: 06/26/2015 3:49:12 PM By: [name] Cool, RN, [name], Kim RN, [name] Entered By: [name] Cool, RN, [name], [name] on 06/26/2015 09:42:42 [name] ([id number]) -------------------------------------------------------------------------------- Wound Assessment Details Patient Name: KAREENA, ELTZ. Date of Service: 06/26/2015 9:00 AM Medical Record Number: [id number] Patient Account Number: [id number] Date of Birth/Sex: September 12, 1955 (59 y.o. Female) Treating RN: [name] Primary Care Physician: [name] Other Clinician: Referring Physician: Nicky [name] Treating Physician/Extender: [name] in Treatment: 26 Wound Status Wound Number: 1 Primary Pressure Ulcer Etiology: Wound Location: Right Back Wound Open Wounding Event: Blister Status: Date Acquired: 10/19/2013 Comorbid Glaucoma, Anemia, Coronary Artery Weeks Of Treatment: 26 History: Disease, Hypertension, Type II Clustered Wound: No Diabetes, End Stage Renal Disease, Osteoarthritis, Neuropathy Photos Photo Uploaded By: [name] Cool, RN, [name], [name] on 06/26/2015 17:06:19 Wound Measurements Length: (cm) 3.2 Width: (cm) 2.5 Depth: (cm) 0.1 Area: (cm) 6.283 Volume: (cm) 0.628 % Reduction in Area: 83.7% % Reduction in Volume: 83.7% Epithelialization: Medium (34-66%) Wound Description Classification: Category/Stage II Wound Margin: Indistinct, nonvisible Exudate Amount: Large Exudate Type: Sanguinous Exudate Color: red Foul Odor After Cleansing: No Wound Bed Granulation Amount: Small (1-33%) Exposed Structure Granulation Quality: Red, Friable Fascia Exposed: No Necrotic Amount: None Present (0%) Fat Layer Exposed: No Bridgett, Gweneth S. ([id number]) Tendon Exposed: No Muscle Exposed: No Joint Exposed: No Bone Exposed: No Limited to Skin Breakdown Periwound Skin Texture Texture Color No Abnormalities Noted: No No  Abnormalities Noted: No Callus: No Atrophie Blanche: No Crepitus: No Cyanosis: No Excoriation: No Ecchymosis: No Fluctuance: No Erythema: No Friable: No Hemosiderin Staining: No Induration: No Mottled: No Localized Edema: Yes Pallor: No Rash: No Rubor: No Scarring: No Moisture No Abnormalities Noted: No Dry / Scaly: No Maceration: No Moist: Yes Wound Preparation Ulcer Cleansing: Rinsed/Irrigated with Saline Topical Anesthetic Applied: Other: lidocaine 4%, Treatment Notes Wound #1 (Right Back) 1. Cleansed with: Clean wound with Normal Saline 2. Anesthetic Topical Lidocaine 4% cream to wound bed prior to debridement 4. Dressing Applied: Aquacel Ag 5. Secondary Dressing Applied Bordered Foam Dressing Electronic Signature(s) Signed: 06/26/2015 3:49:12 PM By: [name] Cool, RN, [name], Kim RN, [name] Entered By: [name] Cool, RN, [name], [name] on 06/26/2015 09:25:00 Pitcher, [name] ([id number]) -------------------------------------------------------------------------------- Wound Assessment Details Patient Name: ALEIZA, HAMMITT. Date of Service: 06/26/2015 9:00 AM Medical Record Number: [id number] Patient Account Number: [id number] Date of Birth/Sex: June 03, 1956 (59 y.o. Female) Treating RN: [name] Primary Care Physician: [name] Other Clinician: Referring Physician: Nicky [name] Treating Physician/Extender: [name] in Treatment: 26 Wound Status Wound Number: 6 Primary Etiology: Venous Leg Ulcer Wound Location: Right, Midline Lower Leg Wound Status: Open Wounding Event: Blister Date Acquired: 05/22/2015 Weeks Of Treatment: 4 Clustered Wound: No Photos Photo Uploaded By: [name] Cool, RN, [name], [name] on 06/26/2015 17:06:19 Wound Measurements Length: (cm) 0 % Reduction Width: (cm) 0 % Reduction Depth: (cm) 0 Area: (cm) 0 Volume: (cm) 0 in Area: 100% in Volume: 100% Wound Description Classification: Partial Thickness Periwound Skin Texture Texture Color No  Abnormalities Noted: No No Abnormalities Noted: No Moisture No Abnormalities Noted: No Electronic Signature(s) Signed: 06/26/2015 3:49:12 PM By: [name] Cool, RN, [name],  Romie Minus, [name] Fleig, [name] ([id number]) Entered By: [name] Cool RN, [name], [name] on 06/26/2015 09:24:38 Stutz, [name] ([id number]) -------------------------------------------------------------------------------- [place] Details [name]. Date of Service: 06/26/2015 9:00 AM Medical Record Number: [id number] Patient Account Number: [id number] Date of Birth/Sex: 13-Jan-1956 (59 y.o. Female) Treating RN: [name] Primary Care Physician: [name] Other Clinician: Referring Physician: Nicky [name] Treating Physician/Extender: [name] in Treatment: 26 Vital Signs Time Taken: 09:20 Temperature (F): 97.9 Height (in): 65 Pulse (bpm): 86 Weight (lbs): 156 Respiratory Rate (breaths/min): 18 Body Mass Index (BMI): 26 Blood Pressure (mmHg): 176/79 Reference Range: 80 - 120 mg / dl Electronic Signature(s) Signed: 06/26/2015 3:49:12 PM By: [name] Cool, RN, [name], Kim RN, [name] Entered By: [name] Cool, RN, [name], [name] on 06/26/2015 09:21:03

## 2015-06-30 ENCOUNTER — Other Ambulatory Visit: Payer: Self-pay | Admitting: Vascular Surgery

## 2015-07-03 ENCOUNTER — Encounter: Payer: Medicare Other | Admitting: Surgery

## 2015-07-03 DIAGNOSIS — E11622 Type 2 diabetes mellitus with other skin ulcer: Secondary | ICD-10-CM | POA: Diagnosis not present

## 2015-07-04 NOTE — Progress Notes (Signed)
ALEYSSA, PALOMAR ([id number]) Visit Report for 07/03/2015 Chief Complaint Document Details [name], [name] 07/03/2015 8:45 Date of Service: AM Medical Record [id number] Number: Patient Account Number: [id number] 1955/12/20 (59 y.o. Treating RN: Montey Hora Date of Birth/Sex: Female) Other Clinician: Primary Care Physician: [name] Treating Cristela Stalder Referring Physician: [name] Physician/Extender: [name]: 27 Information Obtained from: Patient Chief Complaint Patient presents to the wound care center for a consult due non healing wound. She has an open wound on her right upper back which she's had for about a year and she recently noticed a blister on her right lower extremity about 2 weeks ago. Electronic Signature(s) Signed: 07/03/2015 10:17:08 AM By: [name], [name] Entered By: [name] on 07/03/2015 10:17:08 Kemple, [name] ([id number]) -------------------------------------------------------------------------------- HPI Details [name], [name] 07/03/2015 8:45 Date of Service: AM Medical Record [id number] Number: Patient Account Number: [id number] [birth year] (59 y.o. Treating RN: Montey Hora Date of Birth/Sex: Female) Other Clinician: Primary Care Physician: [name] Treating Arun Herrod Referring Physician: [name] Physician/Extender: [name]: 27 History of Present Illness Location: right upper back and right lower extremity wounds Quality: Patient reports No Pain. Severity: Patient states wound (s) are getting better. Duration: Patient has had the wound for > 3 months prior to seeking treatment at the wound center Timing: she thought it first occurred when she was using a heating pad about a year ago after back surgery. Context: The wound appeared gradually over time Modifying Factors: Patient is currently on renal dialysis and receives treatments 3 times weekly Associated  Signs and Symptoms: Patient reports having: surgery scheduled for this week for a AV fistula left arm. HPI Description: 59 year old patient who is known to be a diabetic and has end-stage renal disease has had several comorbidities including coronary artery disease, hypertension, hyperlipidemia, pancreatitis, anemia, previous history of hysterectomy, cholecystectomy, left-sided salivary gland excision, bilateral cataract surgery,Peritoneal dialysis catheter, hemodialysis catheter. the area on the back has also been caused by instant pressure she used to sleep on a recliner all day and has significant kyphoscoliosis. As far as the wound on her right lower extremity she's not sure how this blister occurred but she thought it has been there for about 2 weeks. No recent blood investigations available and no recent hemoglobin A1c. 12/30/2014 -- she is an assisted living facility but I believe the nurses that have not followed instructions as she had some cream applied on her back and there was a different dressing. Last week she's had a AV fistula placed on her left forearm. 01/13/2015 -- she has had some localized infection at the port site and she's been on doxycycline for this. 02/05/2015 - he has developed a small blister on her right lower extremity. 02/10/2015 -- she has developed another small blister on her right anterior chest wall in the area where she's had tape for her dialysis access. this may just be injury caused by a tape burn. 02/18/2015 -- no new blisters and she had a dermatology opinion and they have taken a biopsy of her skin. She also had a left brachial AV fistula placed this week. 03/03/2015 -- though we do not have the pathology report yet the patient says she has been put on prednisone because this skin disease is possibly to do with her immune system and her dermatologist is recommended this. 03/10/2015 -- she has developed a new blister which is quite large on her right  lower extremity on the shin.  05/22/2015 -- she did very well with her dressing but on trying to remove the [place] it peels away Mikula, Aryana S. ([id number]) the newly healed skin. 06/05/2015 -- the patient was in the ER this past week for severe bleeding from the right nostril and had to have ENT see her and do a packing. They've also been holding her heparin during her dialysis. The patient is going back to ENT today. Other than that she has had no other significant issues. 06/19/2015 -- she is back on her blood thinners and continues to have her hemodialysis as before. Electronic Signature(s) Signed: 07/03/2015 10:17:16 AM By: [name], [name] Entered By: [name] on 07/03/2015 10:17:16 Duve, [name] ([id number]) -------------------------------------------------------------------------------- Physical Exam Details [name], [name] 07/03/2015 8:45 Date of Service: AM Medical Record [id number] Number: Patient Account Number: [id number] [birth year] (59 y.o. Treating RN: Montey Hora Date of Birth/Sex: Female) Other Clinician: Primary Care Physician: [name] Treating Jhaniya Briski Referring Physician: [name] Physician/Extender: [name]: 27 Constitutional . Pulse regular. Respirations normal and unlabored. Afebrile. . Eyes Nonicteric. Reactive to light. Ears, Nose, Mouth, and Throat Lips, teeth, and gums WNL.Marland Kitchen Moist mucosa without lesions . Neck supple and nontender. No palpable supraclavicular or cervical adenopathy. Normal sized without goiter. Respiratory WNL. No retractions.. Cardiovascular Pedal Pulses WNL. No clubbing, cyanosis or edema. Chest Breasts symmetical and no nipple discharge.. Breast tissue WNL, no masses, lumps, or tenderness.. Lymphatic No adneopathy. No adenopathy. No adenopathy. Musculoskeletal Adexa without tenderness or enlargement.. Digits and nails w/o clubbing, cyanosis, infection,  petechiae, ischemia, or inflammatory conditions.. Integumentary (Hair, Skin) No suspicious lesions. No crepitus or fluctuance. No peri-wound warmth or erythema. No masses.Marland Kitchen Psychiatric Judgement and insight Intact.. No evidence of depression, anxiety, or agitation.. Notes she has a recurrent blister on the right lower extremity which we will treat appropriately. This is superficial. The lesion on her back has significant new skin but this is so fragile that every time we remove the dressing will know some of it. The hypergranulation tissue is improved tremendously. Electronic Signature(s) Signed: 07/03/2015 10:18:14 AM By: [name], [name] Entered By: [name] on 07/03/2015 10:18:13 Larzelere, [name] ([id number]) -------------------------------------------------------------------------------- Physician Orders Details Patient Name: CALLEIGH, GHASSEMI 07/03/2015 8:45 Date of Service: AM Medical Record [id number] Number: Patient Account Number: [id number] [birth year] (59 y.o. Treating RN: Montey Hora Date of Birth/Sex: Female) Other Clinician: Primary Care Physician: [name] Treating Jaycee Mckellips Referring Physician: [name] Physician/Extender: [name]: 2 Verbal / Phone Orders: Yes Clinician: Montey Hora Read Back and Verified: Yes Diagnosis Coding Wound Cleansing Wound #1 Right Back o Clean wound with Normal Saline. Skin Barriers/Peri-Wound Care Wound #1 Right Back o Skin Prep Primary Wound Dressing Wound #1 Right Back o Other: - siltec sorbact if dressing needs to be changed please use the RTD Wound #7 Right,Distal,Midline Lower Leg o Other: - mepilex lite Secondary Dressing Wound #1 Right Back o Boardered Foam Dressing Dressing Change Frequency Wound #1 Right Back o Other: - twice a week if saturated if dressing needs to be changed please use the RTD Wound #7 Right,Distal,Midline Lower Leg o Change dressing every week  - at [place] Follow-up Appointments Wound #1 Right Back o Return Appointment in 1 week. Electronic Signature(s) Signed: 07/03/2015 4:28:44 PM By: [name], [name] Propst, [name] ([id number]) Signed: 07/03/2015 5:02:54 PM By: Montey Hora Entered By: Montey Hora on 07/03/2015 09:27:54 Orihuela, [name] ([id number]) -------------------------------------------------------------------------------- Problem List Details Patient  Name: Bruni, NKECHINYERE FERDERER 07/03/2015 8:45 Date of Service: AM Medical Record [id number] Number: Patient Account Number: [id number] Feb 19, 1956 (59 y.o. Treating RN: Montey Hora Date of Birth/Sex: Female) Other Clinician: Primary Care Physician: [name] Treating Damira Kem Referring Physician: [name] Physician/Extender: [name]: 27 Active Problems ICD-10 Encounter Code Description Active Date Diagnosis E11.622 Type 2 diabetes mellitus with other skin ulcer 12/23/2014 Yes L89.112 Pressure ulcer of right upper back, stage 2 12/23/2014 Yes S80.821A Blister (nonthermal), right lower leg, initial encounter 12/23/2014 Yes N18.6 End stage renal disease 12/23/2014 Yes Inactive Problems Resolved Problems Electronic Signature(s) Signed: 07/03/2015 10:16:57 AM By: [name], [name] Entered By: [name] on 07/03/2015 10:16:57 Jalbert, [name] ([id number]) -------------------------------------------------------------------------------- Progress Note Details [name], [name]. 07/03/2015 8:45 Date of Service: AM Medical Record [id number] Number: Patient Account Number: [id number] Jun 08, 1956 (59 y.o. Treating RN: Montey Hora Date of Birth/Sex: Female) Other Clinician: Primary Care Physician: [name] Treating Norvel Wenker Referring Physician: [name] Physician/Extender: [name]: 27 Subjective Chief Complaint Information obtained from Patient Patient presents to the wound  care center for a consult due non healing wound. She has an open wound on her right upper back which she's had for about a year and she recently noticed a blister on her right lower extremity about 2 weeks ago. History of Present Illness (HPI) The following HPI elements were documented for the patient's wound: Location: right upper back and right lower extremity wounds Quality: Patient reports No Pain. Severity: Patient states wound (s) are getting better. Duration: Patient has had the wound for > 3 months prior to seeking treatment at the wound center Timing: she thought it first occurred when she was using a heating pad about a year ago after back surgery. Context: The wound appeared gradually over time Modifying Factors: Patient is currently on renal dialysis and receives treatments 3 times weekly Associated Signs and Symptoms: Patient reports having: surgery scheduled for this week for a AV fistula left arm. 59 year old patient who is known to be a diabetic and has end-stage renal disease has had several comorbidities including coronary artery disease, hypertension, hyperlipidemia, pancreatitis, anemia, previous history of hysterectomy, cholecystectomy, left-sided salivary gland excision, bilateral cataract surgery,Peritoneal dialysis catheter, hemodialysis catheter. the area on the back has also been caused by instant pressure she used to sleep on a recliner all day and has significant kyphoscoliosis. As far as the wound on her right lower extremity she's not sure how this blister occurred but she thought it has been there for about 2 weeks. No recent blood investigations available and no recent hemoglobin A1c. 12/30/2014 -- she is an assisted living facility but I believe the nurses that have not followed instructions as she had some cream applied on her back and there was a different dressing. Last week she's had a AV fistula placed on her left forearm. 01/13/2015 -- she has had some  localized infection at the port site and she's been on doxycycline for this. 02/05/2015 - he has developed a small blister on her right lower extremity. 02/10/2015 -- she has developed another small blister on her right anterior chest wall in the area where she's had tape for her dialysis access. this may just be injury caused by a tape burn. ALIVIANA, EWY ([id number]) 02/18/2015 -- no new blisters and she had a dermatology opinion and they have taken a biopsy of her skin. She also had a left brachial AV fistula placed this week. 03/03/2015 -- though we  do not have the pathology report yet the patient says she has been put on prednisone because this skin disease is possibly to do with her immune system and her dermatologist is recommended this. 03/10/2015 -- she has developed a new blister which is quite large on her right lower extremity on the shin. 05/22/2015 -- she did very well with her dressing but on trying to remove the [place] it peels away the newly healed skin. 06/05/2015 -- the patient was in the ER this past week for severe bleeding from the right nostril and had to have ENT see her and do a packing. They've also been holding her heparin during her dialysis. The patient is going back to ENT today. Other than that she has had no other significant issues. 06/19/2015 -- she is back on her blood thinners and continues to have her hemodialysis as before. Objective Constitutional Pulse regular. Respirations normal and unlabored. Afebrile. Vitals Time Taken: 9:01 AM, Height: 65 in, Weight: 156 lbs, BMI: 26, Temperature: 98.2 F, Pulse: 86 bpm, Respiratory Rate: 20 breaths/min, Blood Pressure: 178/84 mmHg. Eyes Nonicteric. Reactive to light. Ears, Nose, Mouth, and Throat Lips, teeth, and gums WNL.Marland Kitchen Moist mucosa without lesions . Neck supple and nontender. No palpable supraclavicular or cervical adenopathy. Normal sized without goiter. Respiratory WNL. No  retractions.. Cardiovascular Pedal Pulses WNL. No clubbing, cyanosis or edema. Chest Breasts symmetical and no nipple discharge.. Breast tissue WNL, no masses, lumps, or tenderness.. Lymphatic Lattner, Alanya S. ([id number]) No adneopathy. No adenopathy. No adenopathy. Musculoskeletal Adexa without tenderness or enlargement.. Digits and nails w/o clubbing, cyanosis, infection, petechiae, ischemia, or inflammatory conditions.Marland Kitchen Psychiatric Judgement and insight Intact.. No evidence of depression, anxiety, or agitation.. General Notes: she has a recurrent blister on the right lower extremity which we will treat appropriately. This is superficial. The lesion on her back has significant new skin but this is so fragile that every time we remove the dressing will know some of it. The hypergranulation tissue is improved tremendously. Integumentary (Hair, Skin) No suspicious lesions. No crepitus or fluctuance. No peri-wound warmth or erythema. No masses.. Wound #1 status is Open. Original cause of wound was Blister. The wound is located on the Right Back. The wound measures 4cm length x 2.5cm width x 0.1cm depth; 7.854cm^2 area and 0.785cm^3 volume. The wound is limited to skin breakdown. There is no tunneling or undermining noted. There is a large amount of sanguinous drainage noted. The wound margin is indistinct and nonvisible. There is large (67- 100%) red granulation within the wound bed. There is no necrotic tissue within the wound bed. The periwound skin appearance exhibited: Localized Edema, Moist. The periwound skin appearance did not exhibit: Callus, Crepitus, Excoriation, Fluctuance, Friable, Induration, Rash, Scarring, Dry/Scaly, Maceration, Atrophie Blanche, Cyanosis, Ecchymosis, Hemosiderin Staining, Mottled, Pallor, Rubor, Erythema. Periwound temperature was noted as No Abnormality. The periwound has tenderness on palpation. Wound #7 status is Open. Original cause of wound was Gradually  Appeared. The wound is located on the Right,Distal,Midline Lower Leg. The wound measures 1.1cm length x 1.1cm width x 0.1cm depth; 0.95cm^2 area and 0.095cm^3 volume. The wound is limited to skin breakdown. There is no tunneling or undermining noted. There is a none present amount of drainage noted. The wound margin is flat and intact. There is no granulation within the wound bed. There is a large (67-100%) amount of necrotic tissue within the wound bed including Eschar. The periwound skin appearance exhibited: Localized Edema, Erythema. The surrounding wound skin color is noted with  erythema which is circumferential. Periwound temperature was noted as No Abnormality. The periwound has tenderness on palpation. Assessment Active Problems ICD-10 E11.622 - Type 2 diabetes mellitus with other skin ulcer L89.112 - Pressure ulcer of right upper back, stage 2 S80.821A - Blister (nonthermal), right lower leg, initial encounter N18.6 - End stage renal disease Markuson, Kurstin S. ([id number]) I would like the patient to have RTD form be applied over this area on her back and we will order this for the nursing home to get.Till then it is okay for them to use Mepilex light as available and change it 2 or 3 times a week as necessary with a secondary dressing to absorb the fluid. Today we have used a Art gallery manager on her back and her mepilex light on her right lower extremity. She will come back and see me next week. Plan Wound Cleansing: Wound #1 Right Back: Clean wound with Normal Saline. Skin Barriers/Peri-Wound Care: Wound #1 Right Back: Skin Prep Primary Wound Dressing: Wound #1 Right Back: Other: - siltec sorbact if dressing needs to be changed please use the RTD Wound #7 Right,Distal,Midline Lower Leg: Other: - mepilex lite Secondary Dressing: Wound #1 Right Back: Boardered Foam Dressing Dressing Change Frequency: Wound #1 Right Back: Other: - twice a week if saturated if dressing needs to  be changed please use the RTD Wound #7 Right,Distal,Midline Lower Leg: Change dressing every week - at [facility] Follow-up Appointments: Wound #1 Right Back: Return Appointment in 1 week. I would like the patient to have RTD form be applied over this area on her back and we will order this for the nursing home to get.Till then it is okay for them to use Mepilex light as available and change it 2 or 3 times a week as necessary with a secondary dressing to absorb the fluid. Today we have used a Art gallery manager on her back and her mepilex light on her right lower extremity. [name] [name] ([id number]) She will come back and see me next week. Electronic Signature(s) Signed: 07/03/2015 10:21:02 AM By: [name], [name] Entered By: [name] on 07/03/2015 10:21:02 Irmen, [name] ([id number]) -------------------------------------------------------------------------------- SuperBill Details [name], [name] Date of Service: 07/03/2015 Medical Record Number: [id number] Patient Account Number: [id number] Date of Birth/Sex: 1956/03/29 (59 y.o. Female) Treating RN: Montey Hora Primary Care Physician: [name] Other Clinician: Referring Physician: [name] Treating Physician/Extender: Frann Rider in Treatment: 27 Diagnosis Coding ICD-10 Codes Code Description E11.622 Type 2 diabetes mellitus with other skin ulcer L89.112 Pressure ulcer of right upper back, stage 2 S80.821A Blister (nonthermal), right lower leg, initial encounter N18.6 End stage renal disease Facility Procedures CPT4 Code: [id number] Description: 99213 - WOUND CARE VISIT-LEV 3 EST PT Modifier: Quantity: 1 Physician Procedures CPT4 Code: [id number] Description: [id number] - WC PHYS LEVEL 3 - EST PT ICD-10 Description Diagnosis E11.622 Type 2 diabetes mellitus with other skin ulcer L89.112 Pressure ulcer of right upper back, stage 2 S80.821A Blister (nonthermal), right lower leg,  initial enc N18.6 End  stage renal disease Modifier: ounter Quantity: 1 Electronic Signature(s) Signed: 07/03/2015 12:02:33 PM By: Montey Hora Signed: 07/03/2015 4:28:44 PM By: [name], [name] Previous Signature: 07/03/2015 10:21:21 AM Version By: [name], [name] Entered By: Montey Hora on 07/03/2015 12:02:33

## 2015-07-04 NOTE — Progress Notes (Signed)
JOAN, HOGAN ([id number]) Visit Report for 07/03/2015 Arrival Information Details Patient Name: JESSIECA, ZEMAN. Date of Service: 07/03/2015 8:45 AM Medical Record Number: [id number] Patient Account Number: [id number] Date of Birth/Sex: 1956-04-16 (59 y.o. Female) Treating RN: Montey Hora Primary Care Physician: [name] Other Clinician: Referring Physician: [name] Treating Physician/Extender: Frann Rider in Treatment: 27 Visit Information History Since Last Visit All ordered tests and consults were completed: No Patient Arrived: Walker Added or deleted any medications: No Arrival Time: 08:58 Any new allergies or adverse reactions: No Accompanied By: self Had a fall or experienced change in No Transfer Assistance: None activities of daily living that may affect Patient Identification Verified: Yes risk of falls: Secondary Verification Process Yes Signs or symptoms of abuse/neglect since last No Completed: visito Patient Requires Transmission- No Hospitalized since last visit: No Based Precautions: Pain Present Now: No Patient Has Alerts: Yes Patient Alerts: Patient on Blood Thinner ABI R: 1.28 Electronic Signature(s) Signed: 07/03/2015 5:02:54 PM By: Montey Hora Entered By: Montey Hora on 07/03/2015 09:00:59 Venhuizen, [name] ([id number]) -------------------------------------------------------------------------------- Clinic Level of Care Assessment Details Patient Name: [name] Date of Service: 07/03/2015 8:45 AM Medical Record Number: [id number] Patient Account Number: [id number] Date of Birth/Sex: Apr 21, 1956 (59 y.o. Female) Treating RN: Montey Hora Primary Care Physician: [name] Other Clinician: Referring Physician: [name] Treating Physician/Extender: Frann Rider in Treatment: 27 Clinic Level of Care Assessment Items TOOL 4 Quantity Score []  - Use when only an EandM is performed on FOLLOW-UP visit  0 ASSESSMENTS - Nursing Assessment / Reassessment X - Reassessment of Co-morbidities (includes updates in patient status) 1 10 X - Reassessment of Adherence to Treatment Plan 1 5 ASSESSMENTS - Wound and Skin Assessment / Reassessment []  - Simple Wound Assessment / Reassessment - one wound 0 X - Complex Wound Assessment / Reassessment - multiple wounds 2 5 []  - Dermatologic / Skin Assessment (not related to wound area) 0 ASSESSMENTS - Focused Assessment []  - Circumferential Edema Measurements - multi extremities 0 []  - Nutritional Assessment / Counseling / Intervention 0 X - Lower Extremity Assessment (monofilament, tuning fork, pulses) 1 5 []  - Peripheral Arterial Disease Assessment (using hand held doppler) 0 ASSESSMENTS - Ostomy and/or Continence Assessment and Care []  - Incontinence Assessment and Management 0 []  - Ostomy Care Assessment and Management (repouching, etc.) 0 PROCESS - Coordination of Care X - Simple Patient / Family Education for ongoing care 1 15 []  - Complex (extensive) Patient / Family Education for ongoing care 0 []  - Staff obtains Programmer, systems, Records, Test Results / Process Orders 0 []  - Staff telephones HHA, Nursing Homes / Clarify orders / etc 0 []  - Routine Transfer to another Facility (non-emergent condition) 0 [name], Euleta S. ([id number]) []  - Routine Hospital Admission (non-emergent condition) 0 []  - New Admissions / Biomedical engineer / Ordering NPWT, Apligraf, etc. 0 []  - Emergency Hospital Admission (emergent condition) 0 X - Simple Discharge Coordination 1 10 []  - Complex (extensive) Discharge Coordination 0 PROCESS - Special Needs []  - Pediatric / Minor Patient Management 0 []  - Isolation Patient Management 0 []  - Hearing / Language / Visual special needs 0 []  - Assessment of Community assistance (transportation, D/C planning, etc.) 0 []  - Additional assistance / Altered mentation 0 []  - Support Surface(s) Assessment (bed, cushion, seat, etc.)  0 INTERVENTIONS - Wound Cleansing / Measurement []  - Simple Wound Cleansing - one wound 0 X - Complex Wound Cleansing - multiple wounds 2 5 X - Wound  Imaging (photographs - any number of wounds) 1 5 []  - Wound Tracing (instead of photographs) 0 []  - Simple Wound Measurement - one wound 0 X - Complex Wound Measurement - multiple wounds 2 5 INTERVENTIONS - Wound Dressings X - Small Wound Dressing one or multiple wounds 2 10 []  - Medium Wound Dressing one or multiple wounds 0 []  - Large Wound Dressing one or multiple wounds 0 []  - Application of Medications - topical 0 []  - Application of Medications - injection 0 INTERVENTIONS - Miscellaneous []  - External ear exam 0 Want, Francoise S. ([id number]) []  - Specimen Collection (cultures, biopsies, blood, body fluids, etc.) 0 []  - Specimen(s) / Culture(s) sent or taken to Lab for analysis 0 []  - Patient Transfer (multiple staff / Harrel Lemon Lift / Similar devices) 0 []  - Simple Staple / Suture removal (25 or less) 0 []  - Complex Staple / Suture removal (26 or more) 0 []  - Hypo / Hyperglycemic Management (close monitor of Blood Glucose) 0 []  - Ankle / Brachial Index (ABI) - do not check if billed separately 0 X - Vital Signs 1 5 Has the patient been seen at the hospital within the last three years: Yes Total Score: 105 Level Of Care: New/Established - Level 3 Electronic Signature(s) Signed: 07/03/2015 12:02:14 PM By: Montey Hora Entered By: Montey Hora on 07/03/2015 12:02:12 Musick, [name] ([id number]) -------------------------------------------------------------------------------- Encounter Discharge Information Details [name], Audri S. Date of Service: 07/03/2015 8:45 AM Medical Record Number: [id number] Patient Account Number: [id number] Date of Birth/Sex: 08-02-1955 (59 y.o. Female) Treating RN: Montey Hora Primary Care Physician: [name] Other Clinician: Referring Physician: [name] Treating  Physician/Extender: Frann Rider in Treatment: 30 Encounter Discharge Information Items Discharge Pain Level: 0 Discharge Condition: Stable Ambulatory Status: Walker Discharge Destination: Nursing Home Transportation: Other Accompanied By: self Schedule Follow-up Appointment: Yes Medication Reconciliation completed and provided to Patient/Care Yes Agape Hardiman: Provided on Clinical Summary of Care: 07/03/2015 Form Type Recipient Paper Patient LD Electronic Signature(s) Signed: 07/03/2015 9:33:09 AM By: Ruthine Dose Entered By: Ruthine Dose on 07/03/2015 09:33:09 Roots, [name] ([id number]) -------------------------------------------------------------------------------- Lower Extremity Assessment Details [name], Jovanka S. Date of Service: 07/03/2015 8:45 AM Medical Record Number: [id number] Patient Account Number: [id number] Date of Birth/Sex: [birth year] (59 y.o. Female) Treating RN: Montey Hora Primary Care Physician: [name] Other Clinician: Referring Physician: [name] Treating Physician/Extender: Frann Rider in Treatment: 27 Electronic Signature(s) Signed: 07/03/2015 5:02:54 PM By: Montey Hora Entered By: Montey Hora on 07/03/2015 09:03:04 Kneisel, [name] ([id number]) -------------------------------------------------------------------------------- Multi Wound Chart Details Patient Name: Leavell, Ziare S. Date of Service: 07/03/2015 8:45 AM Medical Record Number: [id number] Patient Account Number: [id number] Date of Birth/Sex: 10-05-1955 (59 y.o. Female) Treating RN: Montey Hora Primary Care Physician: [name] Other Clinician: Referring Physician: [name] Treating Physician/Extender: Frann Rider in Treatment: 27 Vital Signs Height(in): 65 Pulse(bpm): 86 Weight(lbs): 156 Blood Pressure 178/84 (mmHg): Body Mass Index(BMI): 26 Temperature(F): 98.2 Respiratory Rate 20 (breaths/min): Photos:  [1:No Photos] [7:No Photos] [N/A:N/A] Wound Location: [1:Right Back] [7:Right Lower Leg - Midline, N/A Distal] Wounding Event: [1:Blister] [7:Gradually Appeared] [N/A:N/A] Primary Etiology: [1:Pressure Ulcer] [7:To be determined] [N/A:N/A] Comorbid History: [1:Glaucoma, Anemia, Coronary Artery Disease, Coronary Artery Disease, Hypertension, Type II Diabetes, End Stage Renal Disease, Osteoarthritis, Neuropathy Osteoarthritis, Neuropathy] [7:Glaucoma, Anemia, Hypertension, Type II Diabetes,  End Stage Renal Disease,] [N/A:N/A] Date Acquired: [1:10/19/2013] [7:06/30/2015] [N/A:N/A] Weeks of Treatment: [1:27] [7:0] [N/A:N/A] Wound Status: [1:Open] [7:Open] [N/A:N/A] Measurements L x W x D 4x2.5x0.1 [7:1.1x1.1x0.1] [  N/A:N/A] (cm) Area (cm) : [1:7.854] [7:0.95] [N/A:N/A] Volume (cm) : [1:0.785] [7:0.095] [N/A:N/A] % Reduction in Area: [1:79.60%] [7:N/A] [N/A:N/A] % Reduction in Volume: 79.60% [7:N/A] [N/A:N/A] Classification: [1:Category/Stage II] [7:Partial Thickness] [N/A:N/A] HBO Classification: [1:N/A] [7:Grade 1] [N/A:N/A] Exudate Amount: [1:Large] [7:None Present] [N/A:N/A] Exudate Type: [1:Sanguinous] [7:N/A] [N/A:N/A] Exudate Color: [1:red] [7:N/A] [N/A:N/A] Wound Margin: [1:Indistinct, nonvisible] [7:Flat and Intact] [N/A:N/A] Granulation Amount: [1:Large (67-100%)] [7:None Present (0%)] [N/A:N/A] Granulation Quality: [1:Red] [7:N/A] [N/A:N/A] Necrotic Amount: [1:None Present (0%)] [7:Large (67-100%)] [N/A:N/A] Necrotic Tissue: [1:N/A] [7:Eschar] [N/A:N/A] Exposed Structures: Fascia: No Fascia: No N/A Fat: No Fat: No Tendon: No Tendon: No Muscle: No Muscle: No Joint: No Joint: No Bone: No Bone: No Limited to Skin Limited to Skin Breakdown Breakdown Epithelialization: None None N/A Periwound Skin Texture: Edema: Yes Edema: Yes N/A Excoriation: No Induration: No Callus: No Crepitus: No Fluctuance: No Friable: No Rash: No Scarring: No Periwound Skin Moist: Yes No  Abnormalities Noted N/A Moisture: Maceration: No Dry/Scaly: No Periwound Skin Color: Atrophie Blanche: No Erythema: Yes N/A Cyanosis: No Ecchymosis: No Erythema: No Hemosiderin Staining: No Mottled: No Pallor: No Rubor: No Erythema Location: N/A Circumferential N/A Temperature: No Abnormality No Abnormality N/A Tenderness on Yes Yes N/A Palpation: Wound Preparation: Ulcer Cleansing: Ulcer Cleansing: N/A Rinsed/Irrigated with Rinsed/Irrigated with Saline Saline Topical Anesthetic Topical Anesthetic Applied: Other: lidocaine Applied: Other: lidocaine 4% 4% Treatment Notes Electronic Signature(s) Signed: 07/03/2015 5:02:54 PM By: Montey Hora Entered By: Montey Hora on 07/03/2015 09:11:48 Latino, [name] ([id number]) -------------------------------------------------------------------------------- [place] Details Patient Name: [name] Date of Service: 07/03/2015 8:45 AM Medical Record Number: [id number] Patient Account Number: [id number] Date of Birth/Sex: [birth year] (59 y.o. Female) Treating RN: Montey Hora Primary Care Physician: [name] Other Clinician: Referring Physician: [name] Treating Physician/Extender: Frann Rider in Treatment: 16 Active Inactive Abuse / Safety / Falls / Self Care Management Nursing Diagnoses: Potential for falls Goals: Patient will remain injury free Date Initiated: 12/23/2014 Goal Status: Active Patient/caregiver will verbalize understanding of skin care regimen Date Initiated: 12/23/2014 Goal Status: Active Patient/caregiver will verbalize/demonstrate measures taken to prevent injury and/or falls Date Initiated: 12/23/2014 Goal Status: Active Patient/caregiver will verbalize/demonstrate understanding of what to do in case of emergency Date Initiated: 12/23/2014 Goal Status: Active Interventions: Assess fall risk on admission and as needed Assess: immobility, friction, shearing,  incontinence upon admission and as needed Assess impairment of mobility on admission and as needed per policy Assess self care needs on admission and as needed Provide education on fall prevention Notes: Wound/Skin Impairment Nursing Diagnoses: Impaired tissue integrity Knowledge deficit related to ulceration/compromised skin integrity Goals: Patient/caregiver will verbalize understanding of skin care regimen MARIONA, BRUMMELL ([id number]) Date Initiated: 12/23/2014 Goal Status: Active Ulcer/skin breakdown will heal within 14 weeks Date Initiated: 12/23/2014 Goal Status: Active Interventions: Assess patient/caregiver ability to obtain necessary supplies Assess patient/caregiver ability to perform ulcer/skin care regimen upon admission and as needed Assess ulceration(s) every visit Provide education on ulcer and skin care Treatment Activities: Skin care regimen initiated : 07/03/2015 Topical wound management initiated : 07/03/2015 Notes: Electronic Signature(s) Signed: 07/03/2015 5:02:54 PM By: Montey Hora Entered By: Montey Hora on 07/03/2015 09:11:40 Demars, [name] ([id number]) -------------------------------------------------------------------------------- Pain Assessment Details Patient Name: [name] Date of Service: 07/03/2015 8:45 AM Medical Record Number: [id number] Patient Account Number: [id number] Date of Birth/Sex: 1955/11/04 (59 y.o. Female) Treating RN: Montey Hora Primary Care Physician: [name] Other Clinician: Referring Physician: [name] Treating Physician/Extender: Frann Rider in Treatment: 27 Active Problems Location  of Pain Severity and Description of Pain Patient Has Paino No Site Locations Pain Management and Medication Current Pain Management: Electronic Signature(s) Signed: 07/03/2015 5:02:54 PM By: Montey Hora Entered By: Montey Hora on 07/03/2015 09:01:07 Heiss, [name]  ([id number]) -------------------------------------------------------------------------------- Patient/Caregiver Education Details Patient Name: [name] Date of Service: 07/03/2015 8:45 AM Medical Record Number: [id number] Patient Account Number: [id number] Date of Birth/Gender: 1955-12-30 (59 y.o. Female) Treating RN: Montey Hora Primary Care Physician: [name] Other Clinician: Referring Physician: [name] Treating Physician/Extender: Frann Rider in Treatment: 66 Education Assessment Education Provided To: Patient Education Topics Provided Wound/Skin Impairment: Handouts: Other: change dressings as ordered Methods: Demonstration, Explain/Verbal Responses: State content correctly Electronic Signature(s) Signed: 07/03/2015 5:02:54 PM By: Montey Hora Entered By: Montey Hora on 07/03/2015 09:29:38 Coba, [name] ([id number]) -------------------------------------------------------------------------------- Wound Assessment Details Patient Name: Perfect, Larkyn S. Date of Service: 07/03/2015 8:45 AM Medical Record Number: [id number] Patient Account Number: [id number] Date of Birth/Sex: January 11, 1956 (59 y.o. Female) Treating RN: Montey Hora Primary Care Physician: [name] Other Clinician: Referring Physician: [name] Treating Physician/Extender: Frann Rider in Treatment: 27 Wound Status Wound Number: 1 Primary Pressure Ulcer Etiology: Wound Location: Right Back Wound Open Wounding Event: Blister Status: Date Acquired: 10/19/2013 Comorbid Glaucoma, Anemia, Coronary Artery Weeks Of Treatment: 27 History: Disease, Hypertension, Type II Clustered Wound: No Diabetes, End Stage Renal Disease, Osteoarthritis, Neuropathy Photos Photo Uploaded By: Montey Hora on 07/03/2015 12:52:07 Wound Measurements Length: (cm) 4 Width: (cm) 2.5 Depth: (cm) 0.1 Area: (cm) 7.854 Volume: (cm) 0.785 % Reduction in Area: 79.6% %  Reduction in Volume: 79.6% Epithelialization: None Tunneling: No Undermining: No Wound Description Classification: Category/Stage II Wound Margin: Indistinct, nonvisible Exudate Amount: Large Exudate Type: Sanguinous Exudate Color: red Foul Odor After Cleansing: No Wound Bed Granulation Amount: Large (67-100%) Exposed Structure Granulation Quality: Red Fascia Exposed: No Necrotic Amount: None Present (0%) Fat Layer Exposed: No Kwasny, Rosana S. ([id number]) Tendon Exposed: No Muscle Exposed: No Joint Exposed: No Bone Exposed: No Limited to Skin Breakdown Periwound Skin Texture Texture Color No Abnormalities Noted: No No Abnormalities Noted: No Callus: No Atrophie Blanche: No Crepitus: No Cyanosis: No Excoriation: No Ecchymosis: No Fluctuance: No Erythema: No Friable: No Hemosiderin Staining: No Induration: No Mottled: No Localized Edema: Yes Pallor: No Rash: No Rubor: No Scarring: No Temperature / Pain Moisture Temperature: No Abnormality No Abnormalities Noted: No Tenderness on Palpation: Yes Dry / Scaly: No Maceration: No Moist: Yes Wound Preparation Ulcer Cleansing: Rinsed/Irrigated with Saline Topical Anesthetic Applied: Other: lidocaine 4%, Treatment Notes Wound #1 (Right Back) 1. Cleansed with: Clean wound with Normal Saline 2. Anesthetic Topical Lidocaine 4% cream to wound bed prior to debridement 4. Dressing Applied: Other dressing (specify in notes) 5. Secondary Dressing Applied Bordered Foam Dressing Notes Mepitel lite, siltec sorbact Electronic Signature(s) Signed: 07/03/2015 5:02:54 PM By: Montey Hora Entered By: Montey Hora on 07/03/2015 09:11:32 Niemann, [name] ([id number]) -------------------------------------------------------------------------------- Wound Assessment Details Patient Name: Forget, Willadene S. Date of Service: 07/03/2015 8:45 AM Medical Record Number: [id number] Patient Account Number: [id number] Date of  Birth/Sex: [birth year] (59 y.o. Female) Treating RN: Montey Hora Primary Care Physician: [name] Other Clinician: Referring Physician: [name] Treating Physician/Extender: Frann Rider in Treatment: 27 Wound Status Wound Number: 7 Primary To be determined Etiology: Wound Location: Right Lower Leg - Midline, Distal Wound Open Status: Wounding Event: Gradually Appeared Comorbid Glaucoma, Anemia, Coronary Artery Date Acquired: 06/30/2015 History: Disease, Hypertension, Type II Weeks Of Treatment: 0 Diabetes, End Stage Renal Disease, Clustered  Wound: No Osteoarthritis, Neuropathy Photos Photo Uploaded By: Montey Hora on 07/03/2015 12:52:07 Wound Measurements Length: (cm) 1.1 Width: (cm) 1.1 Depth: (cm) 0.1 Area: (cm) 0.95 Volume: (cm) 0.095 % Reduction in Area: % Reduction in Volume: Epithelialization: None Tunneling: No Undermining: No Wound Description Classification: Partial Thickness Foul Odor A Diabetic Severity (Wagner): Grade 1 Wound Margin: Flat and Intact Exudate Amount: None Present fter Cleansing: No Wound Bed Granulation Amount: None Present (0%) Exposed Structure Necrotic Amount: Large (67-100%) Fascia Exposed: No Necrotic Quality: Eschar Fat Layer Exposed: No Tendon Exposed: No Knee, Jaiya S. ([id number]) Muscle Exposed: No Joint Exposed: No Bone Exposed: No Limited to Skin Breakdown Periwound Skin Texture Texture Color No Abnormalities Noted: No No Abnormalities Noted: No Localized Edema: Yes Erythema: Yes Erythema Location: Circumferential Moisture No Abnormalities Noted: No Temperature / Pain Temperature: No Abnormality Tenderness on Palpation: Yes Wound Preparation Ulcer Cleansing: Rinsed/Irrigated with Saline Topical Anesthetic Applied: Other: lidocaine 4%, Treatment Notes Wound #7 (Right, Distal, Midline Lower Leg) 1. Cleansed with: Clean wound with Normal Saline 2. Anesthetic Topical Lidocaine 4% cream  to wound bed prior to debridement 4. Dressing Applied: Other dressing (specify in notes) 5. Secondary Dressing Applied Bordered Foam Dressing Notes Mepitel lite, siltec sorbact Electronic Signature(s) Signed: 07/03/2015 5:02:54 PM By: Montey Hora Entered By: Montey Hora on 07/03/2015 09:08:00 Wuthrich, [name] ([id number]) -------------------------------------------------------------------------------- Vitals Details Patient Name: [name] Date of Service: 07/03/2015 8:45 AM Medical Record Number: [id number] Patient Account Number: [id number] Date of Birth/Sex: [birth year] (59 y.o. Female) Treating RN: Montey Hora Primary Care Physician: [name] Other Clinician: Referring Physician: [name] Treating Physician/Extender: Frann Rider in Treatment: 27 Vital Signs Time Taken: 09:01 Temperature (F): 98.2 Height (in): 65 Pulse (bpm): 86 Weight (lbs): 156 Respiratory Rate (breaths/min): 20 Body Mass Index (BMI): 26 Blood Pressure (mmHg): 178/84 Reference Range: 80 - 120 mg / dl Electronic Signature(s) Signed: 07/03/2015 5:02:54 PM By: Montey Hora Entered By: Montey Hora on 07/03/2015 09:02:17

## 2015-07-05 ENCOUNTER — Emergency Department: Payer: Medicare Other

## 2015-07-05 ENCOUNTER — Other Ambulatory Visit: Payer: Self-pay

## 2015-07-05 ENCOUNTER — Encounter: Payer: Self-pay | Admitting: Emergency Medicine

## 2015-07-05 ENCOUNTER — Emergency Department
Admission: EM | Admit: 2015-07-05 | Discharge: 2015-07-05 | Disposition: A | Discharge status: Home / Self Care | Payer: Medicare Other | Attending: Emergency Medicine | Admitting: Emergency Medicine

## 2015-07-05 DIAGNOSIS — Z992 Dependence on renal dialysis: Secondary | ICD-10-CM | POA: Insufficient documentation

## 2015-07-05 DIAGNOSIS — Z7952 Long term (current) use of systemic steroids: Secondary | ICD-10-CM | POA: Insufficient documentation

## 2015-07-05 DIAGNOSIS — Z7902 Long term (current) use of antithrombotics/antiplatelets: Secondary | ICD-10-CM | POA: Insufficient documentation

## 2015-07-05 DIAGNOSIS — Z7951 Long term (current) use of inhaled steroids: Secondary | ICD-10-CM | POA: Insufficient documentation

## 2015-07-05 DIAGNOSIS — Z7982 Long term (current) use of aspirin: Secondary | ICD-10-CM | POA: Diagnosis not present

## 2015-07-05 DIAGNOSIS — E11622 Type 2 diabetes mellitus with other skin ulcer: Secondary | ICD-10-CM | POA: Diagnosis not present

## 2015-07-05 DIAGNOSIS — I120 Hypertensive chronic kidney disease with stage 5 chronic kidney disease or end stage renal disease: Secondary | ICD-10-CM | POA: Diagnosis not present

## 2015-07-05 DIAGNOSIS — R410 Disorientation, unspecified: Secondary | ICD-10-CM | POA: Insufficient documentation

## 2015-07-05 DIAGNOSIS — E1143 Type 2 diabetes mellitus with diabetic autonomic (poly)neuropathy: Secondary | ICD-10-CM | POA: Insufficient documentation

## 2015-07-05 DIAGNOSIS — Z79899 Other long term (current) drug therapy: Secondary | ICD-10-CM | POA: Diagnosis not present

## 2015-07-05 DIAGNOSIS — N186 End stage renal disease: Secondary | ICD-10-CM | POA: Insufficient documentation

## 2015-07-05 DIAGNOSIS — Z794 Long term (current) use of insulin: Secondary | ICD-10-CM | POA: Diagnosis not present

## 2015-07-05 DIAGNOSIS — R4182 Altered mental status, unspecified: Secondary | ICD-10-CM | POA: Diagnosis present

## 2015-07-05 DIAGNOSIS — Z4931 Encounter for adequacy testing for hemodialysis: Secondary | ICD-10-CM | POA: Insufficient documentation

## 2015-07-05 LAB — COMPREHENSIVE METABOLIC PANEL
ALBUMIN: 3.6 g/dL (ref 3.5–5.0)
ALK PHOS: 78 U/L (ref 38–126)
ALT: 17 U/L (ref 14–54)
AST: 17 U/L (ref 15–41)
Anion gap: 12 (ref 5–15)
BILIRUBIN TOTAL: 0.7 mg/dL (ref 0.3–1.2)
BUN: 68 mg/dL — ABNORMAL HIGH (ref 6–20)
CALCIUM: 9.1 mg/dL (ref 8.9–10.3)
CO2: 28 mmol/L (ref 22–32)
CREATININE: 6.89 mg/dL — AB (ref 0.44–1.00)
Chloride: 102 mmol/L (ref 101–111)
GFR calc non Af Amer: 6 mL/min — ABNORMAL LOW (ref >60)
GFR, EST AFRICAN AMERICAN: 7 mL/min — AB (ref >60)
GLUCOSE: 177 mg/dL — AB (ref 65–99)
Potassium: 5.3 mmol/L — ABNORMAL HIGH (ref 3.5–5.1)
SODIUM: 142 mmol/L (ref 135–145)
TOTAL PROTEIN: 6.8 g/dL (ref 6.5–8.1)

## 2015-07-05 LAB — CBC WITH DIFFERENTIAL/PLATELET
BASOS ABS: 0.1 10*3/uL (ref 0–0.1)
BASOS PCT: 1 %
EOS ABS: 0.3 10*3/uL (ref 0–0.7)
Eosinophils Relative: 4 %
HCT: 40.7 % (ref 35.0–47.0)
Hemoglobin: 13.2 g/dL (ref 12.0–16.0)
Lymphocytes Relative: 8 %
Lymphs Abs: 0.6 10*3/uL — ABNORMAL LOW (ref 1.0–3.6)
MCH: 29.8 pg (ref 26.0–34.0)
MCHC: 32.4 g/dL (ref 32.0–36.0)
MCV: 92.1 fL (ref 80.0–100.0)
MONO ABS: 0.3 10*3/uL (ref 0.2–0.9)
MONOS PCT: 5 %
Neutro Abs: 6.1 10*3/uL (ref 1.4–6.5)
Neutrophils Relative %: 82 %
PLATELETS: 173 10*3/uL (ref 150–440)
RBC: 4.42 MIL/uL (ref 3.80–5.20)
RDW: 16 % — AB (ref 11.5–14.5)
WBC: 7.4 10*3/uL (ref 3.6–11.0)

## 2015-07-05 LAB — URINALYSIS COMPLETE WITH MICROSCOPIC (ARMC ONLY)
BILIRUBIN URINE: NEGATIVE
GLUCOSE, UA: 150 mg/dL — AB
KETONES UR: NEGATIVE mg/dL
Leukocytes, UA: NEGATIVE
NITRITE: NEGATIVE
Protein, ur: >500 mg/dL — AB
SPECIFIC GRAVITY, URINE: 1.008 (ref 1.005–1.030)
pH: 8 (ref 5.0–8.0)

## 2015-07-05 NOTE — ED Provider Notes (Signed)
[facility] Emergency Department Provider Note  Time seen: 12:59 PM  I have reviewed the triage vital signs and the nursing notes.   HISTORY  Chief Complaint Altered Mental Status and Vascular Access Problem    HPI [name] is a 59 y.o. female with a past medical history of diabetes, COPD, CK D, hypertension, MI, CHF, atrial fibrillation, gastric reflux, end-stage renal disease on hemodialysis Monday, Wednesday, Friday, Who presents the emergency department for confusion. According to the patient for the past 2 days she has been feeling confused and "disoriented." Patient states she can't really describe it any further just feels like she is confused. Patient is somnolent during exam, and keeps her eyes closed but continues to answer questions. Patient denies any chest pain, trouble breathing, abdominal pain. Patient did state during her dialysis session yesterday her fistula infiltrated in her dialysis session was stopped short. Patient denies dysuria, states she continues to make urine every day. Has no real complaints besides feeling "disoriented."     Past Medical History  Diagnosis Date  . Diabetes ([place])   . COPD (chronic obstructive pulmonary disease) ([place])   . CKD (chronic kidney disease)   . CAD (coronary artery disease)   . HTN (hypertension)   . MI (myocardial infarction) ([place]) 05-17-2014  . CHF (congestive heart failure) ([place])   . Atrial fibrillation ([place])   . GERD (gastroesophageal reflux disease)   . Pancreatitis   . Peripheral autonomic neuropathy due to diabetes mellitus ([place])   . Diabetic peripheral neuropathy associated with type 2 diabetes mellitus ([place])   . Dialysis patient [facility])     Patient Active Problem List   Diagnosis Date Noted  . Acute coronary syndrome ([place]) 05/20/2014    Past Surgical History  Procedure Laterality Date  . Left heart catheterization with coronary angiogram N/A 05/20/2014    Procedure: LEFT HEART  CATHETERIZATION WITH CORONARY ANGIOGRAM;  Surgeon: Clent Demark, MD;  Location: [facility] CATH LAB;  Service: Cardiovascular;  Laterality: N/A;  . Coronary angioplasty      Cardiac stents  . Back surgery    . Eye surgery      bilateral cataract extractions  . Abdominal hysterectomy    . Cholecystectomy    . Portacath placement    . Av fistula placement Left 12/25/2014    Procedure: ARTERIOVENOUS (AV) FISTULA CREATION;  Surgeon: Algernon Huxley, MD;  Location: [facility] ORS;  Service: Vascular;  Laterality: Left;  . Peripheral vascular catheterization N/A 02/12/2015    Procedure: A/V Shuntogram/Fistulagram;  Surgeon: Algernon Huxley, MD;  Location: [place] CV LAB;  Service: Cardiovascular;  Laterality: N/A;  . Peripheral vascular catheterization N/A 02/12/2015    Procedure: A/V Shunt Intervention;  Surgeon: Algernon Huxley, MD;  Location: [place] CV LAB;  Service: Cardiovascular;  Laterality: N/A;  . Peripheral vascular catheterization N/A 04/09/2015    Procedure: Dialysis/Perma Catheter Removal;  Surgeon: Algernon Huxley, MD;  Location: [place] CV LAB;  Service: Cardiovascular;  Laterality: N/A;    Current Outpatient Rx  Name  Route  Sig  Dispense  Refill  . acetaminophen (TYLENOL) 325 MG tablet   Oral   Take 650 mg by mouth every 6 (six) hours as needed for mild pain.          Marland Kitchen aspirin EC 81 MG tablet   Oral   Take 81 mg by mouth daily.         Marland Kitchen atorvastatin (LIPITOR) 20 MG tablet  Oral   Take 20 mg by mouth every evening.          . baclofen (LIORESAL) 10 MG tablet   Oral   Take 5 mg by mouth at bedtime.         . calcium acetate (PHOSLO) 667 MG capsule   Oral   Take 1,334 mg by mouth daily.         . Calcium Carbonate-Vitamin D (CALCIUM 600+D) 600-400 MG-UNIT tablet   Oral   Take 1 tablet by mouth daily.         . carvedilol (COREG) 6.25 MG tablet   Oral   Take 6.25 mg by mouth 2 (two) times daily.          . clobetasol cream (TEMOVATE) 0.05 %   Topical    Apply 1 application topically 2 (two) times daily as needed (for itching).         . clobetasol ointment (TEMOVATE) 0.05 %   Topical   Apply 1 application topically 2 (two) times daily.         . clopidogrel (PLAVIX) 75 MG tablet   Oral   Take 75 mg by mouth every evening.          . docusate sodium (COLACE) 100 MG capsule   Oral   Take 100 mg by mouth daily.          . famotidine (PEPCID) 20 MG tablet   Oral   Take 20 mg by mouth every evening.         . fluticasone (FLONASE) 50 MCG/ACT nasal spray   Each Nare   Place 1 spray into both nostrils daily.         . furosemide (LASIX) 40 MG tablet   Oral   Take 40 mg by mouth every Monday, Wednesday, and Friday.         . furosemide (LASIX) 80 MG tablet   Oral   Take 80 mg by mouth every Tuesday, Thursday, Saturday, and Sunday.         . hydrALAZINE (APRESOLINE) 50 MG tablet   Oral   Take 50 mg by mouth 2 (two) times daily.         Marland Kitchen HYDROcodone-acetaminophen (NORCO) 5-325 MG per tablet   Oral   Take 1 tablet by mouth every 6 (six) hours as needed for moderate pain.   30 tablet   0   . insulin glargine (LANTUS) 100 UNIT/ML injection   Subcutaneous   Inject 35 Units into the skin at bedtime.          . insulin lispro (HUMALOG) 100 UNIT/ML injection   Subcutaneous   Inject 4-10 Units into the skin daily. Pt uses per sliding scale:    0-200:  No insulin  201-250:  4 units  251-300:  6 units  301-350:  8 units  351-400:  10 units Greater than 400:  Call MD         . isosorbide dinitrate (ISORDIL) 30 MG tablet   Oral   Take 30 mg by mouth daily.          Marland Kitchen levothyroxine (SYNTHROID, LEVOTHROID) 50 MCG tablet   Oral   Take 50 mcg by mouth at bedtime.          Marland Kitchen losartan (COZAAR) 50 MG tablet   Oral   Take 50 mg by mouth 2 (two) times daily.          . magnesium oxide (MAG-OX) 400  MG tablet   Oral   Take 400 mg by mouth every evening.          . meclizine (ANTIVERT) 25 MG  tablet   Oral   Take 25 mg by mouth every 6 (six) hours as needed for dizziness or nausea.          . metoCLOPramide (REGLAN) 10 MG tablet   Oral   Take 10 mg by mouth at bedtime.         . multivitamin (RENA-VIT) TABS tablet   Oral   Take 1 tablet by mouth daily.         . mycophenolate (CELLCEPT) 500 MG tablet   Oral   Take 500 mg by mouth 2 (two) times daily.         . polyethylene glycol (MIRALAX / GLYCOLAX) packet   Oral   Take 17 g by mouth daily as needed for mild constipation.         . predniSONE (DELTASONE) 10 MG tablet   Oral   Take 10 mg by mouth daily.         . pregabalin (LYRICA) 25 MG capsule   Oral   Take 25 mg by mouth 2 (two) times daily.         . pregabalin (LYRICA) 50 MG capsule   Oral   Take 50 mg by mouth at bedtime.         Marland Kitchen rOPINIRole (REQUIP) 0.25 MG tablet   Oral   Take 0.25 mg by mouth See admin instructions. Pt takes one tablet once a day on Sunday, Tuesday, Thursday, and Saturday.   Pt takes one tablet twice a day on Monday, Wednesday, and Friday.           Allergies Adhesive; Shellfish allergy; and Doxycycline  Family History  Problem Relation Age of Onset  . Diabetes Mother   . Hypertension Mother     Social History Social History  Substance Use Topics  . Smoking status: Never Smoker   . Smokeless tobacco: Never Used  . Alcohol Use: No    Review of Systems Constitutional: Negative for fever. and feels confused/disoriented.  Cardiovascular: Negative for chest pain. Respiratory: Negative for shortness of breath. Gastrointestinal: Negative for abdominal pain Genitourinary: Negative for dysuria. Musculoskeletal: Negative for back pain. Neurological: Negative for headache. Denies focal weakness or numbness. 10-point ROS otherwise negative.  ____________________________________________   PHYSICAL EXAM:  VITAL SIGNS: ED Triage Vitals  Enc Vitals Group     BP 07/05/15 1208 195/86 mmHg     Pulse Rate  07/05/15 1208 99     Resp 07/05/15 1208 20     Temp 07/05/15 1208 98.2 F (36.8 C)     Temp Source 07/05/15 1208 Oral     SpO2 07/05/15 1200 95 %     Weight 07/05/15 1208 180 lb (81.647 kg)     Height 07/05/15 1208 5\' 1"  (1.549 m)     Head Cir --      Peak Flow --      Pain Score --      Pain Loc --      Pain Edu? --      Excl. in [place]? --     Constitutional:  Alert, oriented but the patient is quite somnolent on exam. Eyes: Normal exam ENT   Head: Normocephalic and atraumatic   Mouth/Throat: Mucous membranes are moist. Cardiovascular: Normal rate, regular rhythm. No murmur Respiratory: Normal respiratory effort without tachypnea nor retractions. Breath  sounds are clear Gastrointestinal: Soft and nontender. No distention.   Musculoskeletal: Nontender with normal range of motion in all extremities. bandage applied to left upper extremity fistula. Neurologic:  Normal speech and language. No gross focal neurologic deficits. Equal grip strengths bilaterally, 5/5 motor in all extremities. Skin:  Skin is warm, dry and intact , bandage to left upper extremity fistula, without bleeding. Psychiatric:  Somnolent, but answers questions appropriately, follows commands appropriately.  ____________________________________________    EKG  EKG reviewed and interpreted by myself shows sinus tachycardia 101 bpm, narrow QRS, normal axis, normal intervals, nonspecific ST changes including lateral T-wave inversions. Patient appears to also have lateral changes on her prior EKG of 08/2014.  ____________________________________________    RADIOLOGY  CT within normal limits  ____________________________________________    INITIAL IMPRESSION / ASSESSMENT AND PLAN / ED COURSE  Pertinent labs & imaging results that were available during my care of the patient were reviewed by me and considered in my medical decision making (see chart for details).  Patient presents with feeling confused  and disoriented 2 days. Did not receive her full dialysis session yesterday. Patient is somnolent on exam but answers questions appropriately, is appropriately oriented, and follows all commands appropriately. We will check labs, urinalysis, CT head and closely monitor in the emergency department.  Labs are largely the patient's baseline with a mildly elevated potassium on that the patient is scheduled for dialysis tomorrow. CT is normal. Patient much more awake at this time, answers questions without difficulty. We'll discharge home, patient is up with her primary care doctor as soon as possible.  ____________________________________________   FINAL CLINICAL IMPRESSION(S) / ED DIAGNOSES  Confusion Altered mental status   Harvest Dark, MD 07/05/15 1700

## 2015-07-05 NOTE — Discharge Instructions (Signed)
Your workup today has not shown any acute abnormalities. Please follow-up with your primary care doctor in the next 1-2 days for recheck. Please proceed here dialysis appointment tomorrow as scheduled. Return to the emergency department for any personally concerning symptoms.   Confusion Confusion is the inability to think with your usual speed or clarity. Confusion may come on quickly or slowly over time. How quickly the confusion comes on depends on the cause. Confusion can be due to any number of causes. CAUSES   Concussion, head injury, or head trauma.  Seizures.  Stroke.  Fever.  Brain tumor.  Age related decreased brain function (dementia).  Heightened emotional states like rage or terror.  Mental illness in which the person loses the ability to determine what is real and what is not (hallucinations).  Infections such as a urinary tract infection (UTI).  Toxic effects from alcohol, drugs, or prescription medicines.  Dehydration and an imbalance of salts in the body (electrolytes).  Lack of sleep.  Low blood sugar (diabetes).  Low levels of oxygen from conditions such as chronic lung disorders.  Drug interactions or other medicine side effects.  Nutritional deficiencies, especially niacin, thiamine, vitamin C, or vitamin B.  Sudden drop in body temperature (hypothermia).  Change in routine, such as when traveling or hospitalized. SIGNS AND SYMPTOMS  People often describe their thinking as cloudy or unclear when they are confused. Confusion can also include feeling disoriented. That means you are unaware of where or who you are. You may also not know what the date or time is. If confused, you may also have difficulty paying attention, remembering, and making decisions. Some people also act aggressively when they are confused.  DIAGNOSIS  The medical evaluation of confusion may include:  Blood and urine tests.  X-rays.  Brain and nervous system  tests.  Analyzing your brain waves (electroencephalogram or EEG).  Magnetic resonance imaging (MRI) of your head.  Computed tomography (CT) scan of your head.  Mental status tests in which your health care provider may ask many questions. Some of these questions may seem silly or strange, but they are a very important test to help diagnose and treat confusion. TREATMENT  An admission to the hospital may not be needed, but a person with confusion should not be left alone. Stay with a family member or friend until the confusion clears. Avoid alcohol, pain relievers, or sedative drugs until you have fully recovered. Do not drive until directed by your health care provider. HOME CARE INSTRUCTIONS  What family and friends can do:  To find out if someone is confused, ask the person to state his or her name, age, and the date. If the person is unsure or answers incorrectly, he or she is confused.  Always introduce yourself, no matter how well the person knows you.  Often remind the person of his or her location.  Place a calendar and clock near the confused person.  Help the person with his or her medicines. You may want to use a pill box, an alarm as a reminder, or give the person each dose as prescribed.  Talk about current events and plans for the day.  Try to keep the environment calm, quiet, and peaceful.  Make sure the person keeps follow-up visits with his or her health care provider. PREVENTION  Ways to prevent confusion:  Avoid alcohol.  Eat a balanced diet.  Get enough sleep.  Take medicine only as directed by your health care provider.  Do  not become isolated. Spend time with other people and make plans for your days.  Keep careful watch on your blood sugar levels if you are diabetic. SEEK IMMEDIATE MEDICAL CARE IF:   You develop severe headaches, repeated vomiting, seizures, blackouts, or slurred speech.  There is increasing confusion, weakness, numbness,  restlessness, or personality changes.  You develop a loss of balance, have marked dizziness, feel uncoordinated, or fall.  You have delusions, hallucinations, or develop severe anxiety.  Your family members think you need to be rechecked.   This information is not intended to replace advice given to you by your health care provider. Make sure you discuss any questions you have with your health care provider.   Document Released: 08/11/2004 Document Revised: 07/25/2014 Document Reviewed: 08/09/2013 Elsevier Interactive Patient Education [employer].

## 2015-07-05 NOTE — ED Notes (Signed)
From [facility], hd yesterday and blew fistula on left side (no bleeding currently) distal pulse intact, bruit felt with palpation.  Next dialysis is Monday.  Was feeling dizzy with standing and jerks at times.  Appears tired.  Hypertensive.

## 2015-07-05 NOTE — ED Notes (Signed)
Pt being transported via [facility] to [facility] for discharge. Pt made aware and verbalized understanding at this time.

## 2015-07-05 NOTE — ED Notes (Signed)
Report called to [facility] at this time regarding pt's departure condition. Report given to Leonidas Romberg, LPN.

## 2015-07-06 ENCOUNTER — Emergency Department
Admission: EM | Admit: 2015-07-06 | Discharge: 2015-07-06 | Disposition: A | Discharge status: Home / Self Care | Payer: Medicare Other | Attending: Emergency Medicine | Admitting: Emergency Medicine

## 2015-07-06 ENCOUNTER — Encounter: Payer: Self-pay | Admitting: Emergency Medicine

## 2015-07-06 ENCOUNTER — Ambulatory Visit: Admission: RE | Admit: 2015-07-06 | Payer: Medicare Other | Source: Ambulatory Visit | Admitting: Vascular Surgery

## 2015-07-06 ENCOUNTER — Emergency Department: Payer: Medicare Other

## 2015-07-06 ENCOUNTER — Encounter: Admission: RE | Payer: Self-pay | Source: Ambulatory Visit

## 2015-07-06 ENCOUNTER — Other Ambulatory Visit: Payer: Self-pay

## 2015-07-06 DIAGNOSIS — R4182 Altered mental status, unspecified: Secondary | ICD-10-CM | POA: Diagnosis present

## 2015-07-06 DIAGNOSIS — Z992 Dependence on renal dialysis: Secondary | ICD-10-CM | POA: Insufficient documentation

## 2015-07-06 DIAGNOSIS — N186 End stage renal disease: Secondary | ICD-10-CM | POA: Insufficient documentation

## 2015-07-06 DIAGNOSIS — Z794 Long term (current) use of insulin: Secondary | ICD-10-CM | POA: Diagnosis not present

## 2015-07-06 DIAGNOSIS — Z79899 Other long term (current) drug therapy: Secondary | ICD-10-CM | POA: Diagnosis not present

## 2015-07-06 DIAGNOSIS — Z7982 Long term (current) use of aspirin: Secondary | ICD-10-CM | POA: Insufficient documentation

## 2015-07-06 DIAGNOSIS — I120 Hypertensive chronic kidney disease with stage 5 chronic kidney disease or end stage renal disease: Secondary | ICD-10-CM | POA: Diagnosis not present

## 2015-07-06 DIAGNOSIS — Z7902 Long term (current) use of antithrombotics/antiplatelets: Secondary | ICD-10-CM | POA: Diagnosis not present

## 2015-07-06 DIAGNOSIS — Z7952 Long term (current) use of systemic steroids: Secondary | ICD-10-CM | POA: Diagnosis not present

## 2015-07-06 DIAGNOSIS — R410 Disorientation, unspecified: Secondary | ICD-10-CM | POA: Diagnosis not present

## 2015-07-06 DIAGNOSIS — E1140 Type 2 diabetes mellitus with diabetic neuropathy, unspecified: Secondary | ICD-10-CM | POA: Insufficient documentation

## 2015-07-06 LAB — URINALYSIS COMPLETE WITH MICROSCOPIC (ARMC ONLY)
Bilirubin Urine: NEGATIVE
Glucose, UA: >500 mg/dL — AB
KETONES UR: NEGATIVE mg/dL
LEUKOCYTES UA: NEGATIVE
NITRITE: NEGATIVE
PH: 8 (ref 5.0–8.0)
SPECIFIC GRAVITY, URINE: 1.005 (ref 1.005–1.030)
Squamous Epithelial / LPF: NONE SEEN

## 2015-07-06 LAB — CBC WITH DIFFERENTIAL/PLATELET
BASOS ABS: 0 10*3/uL (ref 0–0.1)
Basophils Relative: 0 %
EOS PCT: 4 %
Eosinophils Absolute: 0.3 10*3/uL (ref 0–0.7)
HEMATOCRIT: 42.3 % (ref 35.0–47.0)
HEMOGLOBIN: 13.3 g/dL (ref 12.0–16.0)
LYMPHS ABS: 0.6 10*3/uL — AB (ref 1.0–3.6)
LYMPHS PCT: 8 %
MCH: 28.6 pg (ref 26.0–34.0)
MCHC: 31.3 g/dL — ABNORMAL LOW (ref 32.0–36.0)
MCV: 91.2 fL (ref 80.0–100.0)
Monocytes Absolute: 0.5 10*3/uL (ref 0.2–0.9)
Monocytes Relative: 6 %
NEUTROS ABS: 6.7 10*3/uL — AB (ref 1.4–6.5)
Neutrophils Relative %: 82 %
PLATELETS: 160 10*3/uL (ref 150–440)
RBC: 4.64 MIL/uL (ref 3.80–5.20)
RDW: 15.9 % — ABNORMAL HIGH (ref 11.5–14.5)
WBC: 8.2 10*3/uL (ref 3.6–11.0)

## 2015-07-06 LAB — COMPREHENSIVE METABOLIC PANEL
ALBUMIN: 4 g/dL (ref 3.5–5.0)
ALT: 20 U/L (ref 14–54)
ANION GAP: 14 (ref 5–15)
AST: 23 U/L (ref 15–41)
Alkaline Phosphatase: 94 U/L (ref 38–126)
BUN: 28 mg/dL — AB (ref 6–20)
CHLORIDE: 95 mmol/L — AB (ref 101–111)
CO2: 30 mmol/L (ref 22–32)
Calcium: 8.7 mg/dL — ABNORMAL LOW (ref 8.9–10.3)
Creatinine, Ser: 3.55 mg/dL — ABNORMAL HIGH (ref 0.44–1.00)
GFR calc Af Amer: 15 mL/min — ABNORMAL LOW (ref >60)
GFR, EST NON AFRICAN AMERICAN: 13 mL/min — AB (ref >60)
Glucose, Bld: 186 mg/dL — ABNORMAL HIGH (ref 65–99)
POTASSIUM: 3.7 mmol/L (ref 3.5–5.1)
Sodium: 139 mmol/L (ref 135–145)
Total Bilirubin: 1.1 mg/dL (ref 0.3–1.2)
Total Protein: 7.5 g/dL (ref 6.5–8.1)

## 2015-07-06 LAB — AMMONIA: AMMONIA: 15 umol/L (ref 9–35)

## 2015-07-06 LAB — LIPASE, BLOOD: Lipase: 27 U/L (ref 11–51)

## 2015-07-06 LAB — TROPONIN I: Troponin I: 0.05 ng/mL — ABNORMAL HIGH (ref <0.031)

## 2015-07-06 SURGERY — A/V SHUNTOGRAM/FISTULAGRAM
Anesthesia: Moderate Sedation | Laterality: Right

## 2015-07-06 NOTE — ED Provider Notes (Signed)
[facility] Emergency Department Provider Note REMINDER - THIS NOTE IS NOT A FINAL MEDICAL RECORD UNTIL IT IS SIGNED. UNTIL THEN, THE CONTENT BELOW MAY REFLECT INFORMATION FROM A DOCUMENTATION TEMPLATE, NOT THE ACTUAL PATIENT VISIT. ____________________________________________  Time seen: Approximately 3:46 PM  I have reviewed the triage vital signs and the nursing notes.   HISTORY  Chief Complaint Altered Mental Status  EM caveat: Some elements of history and review of systems cannot be reliable due to some disorientation and confusion  HPI [name] is a 59 y.o. female presents today after dialysis her she was noted to be confused. The patient does recall coming to the emergency room yesterday for similar but states she just feels worsening confusion.  She denies any pain or new symptoms. She has not noticed any weakness, numbness tingling headache or chest pain. She denies any fevers or chills. She does still urinate, and has not noticed any symptoms with that. She reports that she did do her entire dialysis today.   Past Medical History  Diagnosis Date  . Diabetes ([place])   . COPD (chronic obstructive pulmonary disease) ([place])   . CKD (chronic kidney disease)   . CAD (coronary artery disease)   . HTN (hypertension)   . MI (myocardial infarction) ([place]) 05-17-2014  . CHF (congestive heart failure) ([place])   . Atrial fibrillation ([place])   . GERD (gastroesophageal reflux disease)   . Pancreatitis   . Peripheral autonomic neuropathy due to diabetes mellitus ([place])   . Diabetic peripheral neuropathy associated with type 2 diabetes mellitus ([place])   . Dialysis patient [facility])     Patient Active Problem List   Diagnosis Date Noted  . Acute coronary syndrome ([place]) 05/20/2014    Past Surgical History  Procedure Laterality Date  . Left heart catheterization with coronary angiogram N/A 05/20/2014    Procedure: LEFT HEART CATHETERIZATION WITH CORONARY ANGIOGRAM;   Surgeon: Clent Demark, MD;  Location: [facility] CATH LAB;  Service: Cardiovascular;  Laterality: N/A;  . Coronary angioplasty      Cardiac stents  . Back surgery    . Eye surgery      bilateral cataract extractions  . Abdominal hysterectomy    . Cholecystectomy    . Portacath placement    . Av fistula placement Left 12/25/2014    Procedure: ARTERIOVENOUS (AV) FISTULA CREATION;  Surgeon: Algernon Huxley, MD;  Location: [facility] ORS;  Service: Vascular;  Laterality: Left;  . Peripheral vascular catheterization N/A 02/12/2015    Procedure: A/V Shuntogram/Fistulagram;  Surgeon: Algernon Huxley, MD;  Location: [place] CV LAB;  Service: Cardiovascular;  Laterality: N/A;  . Peripheral vascular catheterization N/A 02/12/2015    Procedure: A/V Shunt Intervention;  Surgeon: Algernon Huxley, MD;  Location: [place] CV LAB;  Service: Cardiovascular;  Laterality: N/A;  . Peripheral vascular catheterization N/A 04/09/2015    Procedure: Dialysis/Perma Catheter Removal;  Surgeon: Algernon Huxley, MD;  Location: [place] CV LAB;  Service: Cardiovascular;  Laterality: N/A;    Current Outpatient Rx  Name  Route  Sig  Dispense  Refill  . acetaminophen (TYLENOL) 325 MG tablet   Oral   Take 650 mg by mouth every 6 (six) hours as needed for mild pain.          Marland Kitchen aspirin EC 81 MG tablet   Oral   Take 81 mg by mouth daily.         Marland Kitchen atorvastatin (LIPITOR) 20 MG tablet  Oral   Take 20 mg by mouth every evening.          . baclofen (LIORESAL) 10 MG tablet   Oral   Take 5 mg by mouth at bedtime.         . calcium acetate (PHOSLO) 667 MG capsule   Oral   Take 1,334 mg by mouth daily.         . Calcium Carbonate-Vitamin D (CALCIUM 600+D) 600-400 MG-UNIT tablet   Oral   Take 1 tablet by mouth daily.         . carvedilol (COREG) 6.25 MG tablet   Oral   Take 6.25 mg by mouth 2 (two) times daily.          . clobetasol cream (TEMOVATE) 0.05 %   Topical   Apply 1 application topically 2 (two) times  daily as needed (for itching).         . clobetasol ointment (TEMOVATE) 0.05 %   Topical   Apply 1 application topically 2 (two) times daily.         . clopidogrel (PLAVIX) 75 MG tablet   Oral   Take 75 mg by mouth every evening.          . docusate sodium (COLACE) 100 MG capsule   Oral   Take 100 mg by mouth daily.          . famotidine (PEPCID) 20 MG tablet   Oral   Take 20 mg by mouth every evening.         . fluticasone (FLONASE) 50 MCG/ACT nasal spray   Each Nare   Place 1 spray into both nostrils daily.         . furosemide (LASIX) 40 MG tablet   Oral   Take 40 mg by mouth every Monday, Wednesday, and Friday.         . furosemide (LASIX) 80 MG tablet   Oral   Take 80 mg by mouth every Tuesday, Thursday, Saturday, and Sunday.         . hydrALAZINE (APRESOLINE) 50 MG tablet   Oral   Take 50 mg by mouth 2 (two) times daily.         Marland Kitchen HYDROcodone-acetaminophen (NORCO) 5-325 MG per tablet   Oral   Take 1 tablet by mouth every 6 (six) hours as needed for moderate pain.   30 tablet   0   . insulin glargine (LANTUS) 100 UNIT/ML injection   Subcutaneous   Inject 35 Units into the skin at bedtime.          . insulin lispro (HUMALOG) 100 UNIT/ML injection   Subcutaneous   Inject 4-10 Units into the skin daily. Pt uses per sliding scale:    0-200:  No insulin  201-250:  4 units  251-300:  6 units  301-350:  8 units  351-400:  10 units Greater than 400:  Call MD         . isosorbide dinitrate (ISORDIL) 30 MG tablet   Oral   Take 30 mg by mouth daily.          Marland Kitchen levothyroxine (SYNTHROID, LEVOTHROID) 50 MCG tablet   Oral   Take 50 mcg by mouth at bedtime.          Marland Kitchen losartan (COZAAR) 50 MG tablet   Oral   Take 50 mg by mouth 2 (two) times daily.          . magnesium oxide (MAG-OX) 400  MG tablet   Oral   Take 400 mg by mouth every evening.          . meclizine (ANTIVERT) 25 MG tablet   Oral   Take 25 mg by mouth every 6  (six) hours as needed for dizziness or nausea.          . metoCLOPramide (REGLAN) 10 MG tablet   Oral   Take 10 mg by mouth at bedtime.         . multivitamin (RENA-VIT) TABS tablet   Oral   Take 1 tablet by mouth daily.         . mycophenolate (CELLCEPT) 500 MG tablet   Oral   Take 500 mg by mouth 2 (two) times daily.         . polyethylene glycol (MIRALAX / GLYCOLAX) packet   Oral   Take 17 g by mouth daily as needed for mild constipation.         . predniSONE (DELTASONE) 10 MG tablet   Oral   Take 10 mg by mouth daily.         . pregabalin (LYRICA) 25 MG capsule   Oral   Take 25 mg by mouth 2 (two) times daily.         . pregabalin (LYRICA) 50 MG capsule   Oral   Take 50 mg by mouth at bedtime.         Marland Kitchen rOPINIRole (REQUIP) 0.25 MG tablet   Oral   Take 0.25 mg by mouth See admin instructions. Pt takes one tablet once a day on Sunday, Tuesday, Thursday, and Saturday.   Pt takes one tablet twice a day on Monday, Wednesday, and Friday.           Allergies Adhesive; Shellfish allergy; and Doxycycline  Family History  Problem Relation Age of Onset  . Diabetes Mother   . Hypertension Mother     Social History Social History  Substance Use Topics  . Smoking status: Never Smoker   . Smokeless tobacco: Never Used  . Alcohol Use: No    Review of Systems Constitutional: No fever/chills Eyes: No visual changes. ENT: No sore throat. Cardiovascular: Denies chest pain. Respiratory: Denies shortness of breath. Gastrointestinal: No abdominal pain.  No nausea, no vomiting.  No diarrhea.  No constipation. Genitourinary: Negative for dysuria. Musculoskeletal: Negative for back pain. Skin: Negative for rash. Neurological: Negative for headaches, focal weakness or numbness. Patient does state she is confused though. She feels fuzzy at times.  10-point ROS otherwise negative.  ____________________________________________   PHYSICAL EXAM:  VITAL  SIGNS: ED Triage Vitals  Enc Vitals Group     BP --      Pulse --      Resp --      Temp --      Temp src --      SpO2 --      Weight --      Height --      Head Cir --      Peak Flow --      Pain Score 07/06/15 1540 0     Pain Loc --      Pain Edu? --      Excl. in [place]? --    Constitutional: Alert and oriented to self but does report ears 1986 and that today is Saturday. Well appearing and in no acute distress. Eyes: Conjunctivae are normal. PERRL. EOMI. Head: Atraumatic. Nose: No congestion/rhinnorhea. Mouth/Throat: Mucous membranes are  moist.  Oropharynx non-erythematous. Neck: No stridor.   Cardiovascular: Normal rate, regular rhythm. Grossly normal heart sounds.  Good peripheral circulation. Respiratory: Normal respiratory effort.  No retractions. Lungs CTAB. Gastrointestinal: Soft and nontender. No distention. No abdominal bruits. No CVA tenderness. Musculoskeletal: No lower extremity tenderness nor edema.  No joint effusions. There is bruising over the left upper arm fistula with some bruising noted, bandage in no distress. Neurologic:  Normal speech and language. No gross focal neurologic deficits are appreciated.   NIH score equals 4, performed by me at bedside. Please note that the patient does have some confusion, and has difficulty following complex commands at this time her exam less reliable. The patient has no pronator drift on the right, but some very mild drift on the left. The patient has normal cranial nerve exam. Extraocular movements are normal. Visual fields are normal. Patient has 5 out of 5 strength in all extremities except approximately 4-5 in the left upper extremity. There is no numbness or gross, acute sensory abnormality in the extremities bilaterally. No speech disturbance. Patient has mild word finding difficulty. No aphasia. There is mild bilateral upper extremity ataxia. Normal finger nose finger bilat. Patient speaking in full sentences but  occasionally having difficulty finding words.   Skin:  Skin is warm, dry and intact. No rash noted. Psychiatric: Mood and affect are normal. Speech and behavior are normal.  ____________________________________________   LABS (all labs ordered are listed, but only abnormal results are displayed)  Labs Reviewed  COMPREHENSIVE METABOLIC PANEL - Abnormal; Notable for the following:    Chloride 95 (*)    Glucose, Bld 186 (*)    BUN 28 (*)    Creatinine, Ser 3.55 (*)    Calcium 8.7 (*)    GFR calc non Af Amer 13 (*)    GFR calc Af Amer 15 (*)    All other components within normal limits  CBC WITH DIFFERENTIAL/PLATELET - Abnormal; Notable for the following:    MCHC 31.3 (*)    RDW 15.9 (*)    Neutro Abs 6.7 (*)    Lymphs Abs 0.6 (*)    All other components within normal limits  TROPONIN I - Abnormal; Notable for the following:    Troponin I 0.05 (*)    All other components within normal limits  URINALYSIS COMPLETEWITH MICROSCOPIC ([facility] ONLY) - Abnormal; Notable for the following:    Color, Urine YELLOW (*)    APPearance CLEAR (*)    Glucose, UA >500 (*)    Hgb urine dipstick 1+ (*)    Protein, ur >500 (*)    Bacteria, UA RARE (*)    All other components within normal limits  AMMONIA  LIPASE, BLOOD   ____________________________________________  EKG  Reviewed and interpreted by me Heart rate 95 QRS 90 QTC 420 Probable left ventricular hypertrophy Compared with previous EKG no significant changes noted Patient has nonspecific T-wave abnormality, likely consistent with left ventricular atrophy. No evidence of ST elevation. ____________________________________________  RADIOLOGY  MR Brain Wo Contrast (Final result) Result time: 07/06/15 17:22:12   Final result by Rad Results In Interface (07/06/15 17:22:12)   Narrative:   CLINICAL DATA: 59 year old diabetic hypertensive female presenting with confusion. Recent dialysis treatment incomplete secondary to poor  access. Subsequent encounter.  EXAM: MRI HEAD WITHOUT CONTRAST  TECHNIQUE: Multiplanar, multiecho pulse sequences of the brain and surrounding structures were obtained without intravenous contrast.  COMPARISON: 07/05/2015 head CT. No comparison brain  FINDINGS: No acute infarct.  Moderate  small vessel disease type changes most notable periventricular region and central pons.  No intracranial hemorrhage.  Mild atrophy without hydrocephalus.  No intracranial mass lesion noted on this unenhanced exam.  Major intracranial vascular structures are patent.  Post lens replacement. Prominent exophthalmos.  Moderate mucosal thickening right maxillary sinus.  Cervical spondylotic changes upper cervical spine. Cervical medullary junction,, pituitary region and pineal region unremarkable.  Decreased signal intensity of bone marrow may be related to patient's habitus and/or anemia of renal disease.  IMPRESSION: No acute infarct.  Moderate small vessel disease type changes most notable periventricular region and central pons.  Mild atrophy without hydrocephalus.  No intracranial mass lesion noted on this unenhanced exam.  Post lens replacement. Prominent exophthalmos.  Moderate mucosal thickening right maxillary sinus.     ____________________________________________   PROCEDURES  Procedure(s) performed: None  Critical Care performed: No  ____________________________________________   INITIAL IMPRESSION / ASSESSMENT AND PLAN / ED COURSE  Pertinent labs & imaging results that were available during my care of the patient were reviewed by me and considered in my medical decision making (see chart for details).  Patient presents for evaluation of ongoing confusion. She was actually seen yesterday in the ER and had a fairly hypertensive examination with no evidence of a clear etiology, however she endorses ongoing symptoms of confusion. She is in fact confused and  disoriented to date, seems fairly well oriented to situation. She has no obvious cause by clinical history that I do note she has some slight word finding difficulty and question if she may have some weakness and left arm. She reports these symptoms started a few days ago, and she had a normal CT the head yesterday however today I will obtain an MRI to evaluate for stroke.  In addition we will evaluate for cardiac etiology, reevaluate with urinalysis, review labs, and continue to observe her closely.  ----------------------------------------- 7:21 PM on 07/06/2015 -----------------------------------------  Patient remained stable. Afebrile and in no distress. Fully awake and alert at this time. Thus far MRI shows no evidence of infarction, she feels well. We discussed results, and she is currently alert and oriented to year. She reports that she would prefer to go back to her nursing facility. I discussed her case and care with Dr. Tamala Julian of neurology who notes that there is no significant further evaluation that he would recommend at this time except that this could be due to possible ongoing chronic dialysis, and that she can follow up outpatient. I think this is reasonable, she has no signs of instability and her labs are reassuring. MRI normal. Symptoms are ongoing for about 2-3 days now, and I would expect a stroke to be positive on MRI at this point.  Troponin 0.05, given the patient is a dialysis patient and her EKG shows no changes without any symptoms of chest pain or trouble breathing, I believe this likely does not reprsent any acute coronary syndrome.  Plan to discharge her back to her nursing level facility, follow-up with physician at her nursing facility recommended this week and outpatient follow-up with neurology. ____________________________________________   FINAL CLINICAL IMPRESSION(S) / ED DIAGNOSES  Final diagnoses:  None      Delman Kitten, MD 07/06/15 1924

## 2015-07-06 NOTE — Discharge Instructions (Signed)
Please follow-up with your your practitioner at the nursing facility for the next 1-2 days. Follow-up with outpatient neurology. Continue your usual dialysis regimen.  Return to the emergency room right away if she developed severe weakness, severe headache, fever, other new concerns, notice increased confusion, or other new concerns arise.  Confusion Confusion is the inability to think with your usual speed or clarity. Confusion may come on quickly or slowly over time. How quickly the confusion comes on depends on the cause. Confusion can be due to any number of causes. CAUSES   Concussion, head injury, or head trauma.  Seizures.  Stroke.  Fever.  Brain tumor.  Age related decreased brain function (dementia).  Heightened emotional states like rage or terror.  Mental illness in which the person loses the ability to determine what is real and what is not (hallucinations).  Infections such as a urinary tract infection (UTI).  Toxic effects from alcohol, drugs, or prescription medicines.  Dehydration and an imbalance of salts in the body (electrolytes).  Lack of sleep.  Low blood sugar (diabetes).  Low levels of oxygen from conditions such as chronic lung disorders.  Drug interactions or other medicine side effects.  Nutritional deficiencies, especially niacin, thiamine, vitamin C, or vitamin B.  Sudden drop in body temperature (hypothermia).  Change in routine, such as when traveling or hospitalized. SIGNS AND SYMPTOMS  People often describe their thinking as cloudy or unclear when they are confused. Confusion can also include feeling disoriented. That means you are unaware of where or who you are. You may also not know what the date or time is. If confused, you may also have difficulty paying attention, remembering, and making decisions. Some people also act aggressively when they are confused.  DIAGNOSIS  The medical evaluation of confusion may include:  Blood and  urine tests.  X-rays.  Brain and nervous system tests.  Analyzing your brain waves (electroencephalogram or EEG).  Magnetic resonance imaging (MRI) of your head.  Computed tomography (CT) scan of your head.  Mental status tests in which your health care provider may ask many questions. Some of these questions may seem silly or strange, but they are a very important test to help diagnose and treat confusion. TREATMENT  An admission to the hospital may not be needed, but a person with confusion should not be left alone. Stay with a family member or friend until the confusion clears. Avoid alcohol, pain relievers, or sedative drugs until you have fully recovered. Do not drive until directed by your health care provider. HOME CARE INSTRUCTIONS  What family and friends can do:  To find out if someone is confused, ask the person to state his or her name, age, and the date. If the person is unsure or answers incorrectly, he or she is confused.  Always introduce yourself, no matter how well the person knows you.  Often remind the person of his or her location.  Place a calendar and clock near the confused person.  Help the person with his or her medicines. You may want to use a pill box, an alarm as a reminder, or give the person each dose as prescribed.  Talk about current events and plans for the day.  Try to keep the environment calm, quiet, and peaceful.  Make sure the person keeps follow-up visits with his or her health care provider. PREVENTION  Ways to prevent confusion:  Avoid alcohol.  Eat a balanced diet.  Get enough sleep.  Take medicine only  as directed by your health care provider.  Do not become isolated. Spend time with other people and make plans for your days.  Keep careful watch on your blood sugar levels if you are diabetic. SEEK IMMEDIATE MEDICAL CARE IF:   You develop severe headaches, repeated vomiting, seizures, blackouts, or slurred speech.  There is  increasing confusion, weakness, numbness, restlessness, or personality changes.  You develop a loss of balance, have marked dizziness, feel uncoordinated, or fall.  You have delusions, hallucinations, or develop severe anxiety.  Your family members think you need to be rechecked.   This information is not intended to replace advice given to you by your health care provider. Make sure you discuss any questions you have with your health care provider.   Document Released: 08/11/2004 Document Revised: 07/25/2014 Document Reviewed: 08/09/2013 Elsevier Interactive Patient Education [employer].

## 2015-07-06 NOTE — ED Notes (Signed)
Pt arrived via EMS from dialysis center, lives at [facility] skilled facility with altered mental status per dialysis center for unknown date and time. Pt was not able to remember the year. Per EMS, alerts and oriented. BP-101/99, SpO2-98% on room air, Temp-99.52f. Pt finished dialysis.

## 2015-07-17 ENCOUNTER — Encounter: Payer: Self-pay | Admitting: General Surgery

## 2015-07-17 ENCOUNTER — Encounter (HOSPITAL_BASED_OUTPATIENT_CLINIC_OR_DEPARTMENT_OTHER): Payer: Medicare Other | Admitting: General Surgery

## 2015-07-17 DIAGNOSIS — L89102 Pressure ulcer of unspecified part of back, stage 2: Secondary | ICD-10-CM | POA: Insufficient documentation

## 2015-07-17 DIAGNOSIS — E11622 Type 2 diabetes mellitus with other skin ulcer: Secondary | ICD-10-CM | POA: Diagnosis not present

## 2015-07-17 NOTE — Progress Notes (Signed)
seeiheal 

## 2015-07-18 NOTE — Progress Notes (Signed)
[name], [name] ([id number]) Visit Report for 07/17/2015 Chief Complaint Document Details [name], [name] 07/17/2015 8:45 Date of Service: AM Medical Record [id number] Number: Patient Account Number: [id number] [birth year] (59 y.o. Treating RN: [name] Date of Birth/Sex: Female) Other Clinician: Primary Care Physician: [name], [name] Referring Physician: Nicky [name] [name]: [name] in Treatment: 29 Information Obtained from: Patient Chief Complaint Patient presents to the wound care center for a consult due non healing wound. She has an open wound on her right upper back which she's had for about a year and she recently noticed a blister on her right lower extremity about 2 [name] ago. Electronic Signature(s) Signed: 07/17/2015 9:40:09 AM By: [name] Companion MD Entered By: [name] on 07/17/2015 09:40:09 [name], [name] ([id number]) -------------------------------------------------------------------------------- HPI Details [name], [name] 07/17/2015 8:45 Date of Service: AM Medical Record [id number] Number: Patient Account Number: [id number] February 15, 1956 (59 y.o. Treating RN: [name] Date of Birth/Sex: Female) Other Clinician: Primary Care Physician: [name], [name] Referring Physician: Nicky [name] [name]: [name] in Treatment: 29 History of Present Illness Location: right upper back and right lower extremity wounds Quality: Patient reports No Pain. Severity: Patient states wound (s) are getting better. Duration: Patient has had the wound for > 3 months prior to seeking treatment at the wound center Timing: she thought it first occurred when she was using a heating pad about a year ago after back surgery. Context: The wound appeared gradually over time Modifying Factors: Patient is currently on renal dialysis and receives treatments 3 times weekly Associated Signs  and Symptoms: Patient reports having: surgery scheduled for this week for a AV fistula left arm. HPI Description: 59 year old patient who is known to be a diabetic and has end-stage renal disease has had several comorbidities including coronary artery disease, hypertension, hyperlipidemia, pancreatitis, anemia, previous history of hysterectomy, cholecystectomy, left-sided salivary gland excision, bilateral cataract surgery,Peritoneal dialysis catheter, hemodialysis catheter. the area on the back has also been caused by instant pressure she used to sleep on a recliner all day and has significant kyphoscoliosis. As far as the wound on her right lower extremity she's not sure how this blister occurred but she thought it has been there for about 2 [name]. No recent blood investigations available and no recent hemoglobin A1c. 12/30/2014 -- she is an assisted living facility but I believe the nurses that have not followed instructions as she had some cream applied on her back and there was a different dressing. Last week she's had a AV fistula placed on her left forearm. 01/13/2015 -- she has had some localized infection at the port site and she's been on doxycycline for this. 02/05/2015 - he has developed a small blister on her right lower extremity. 02/10/2015 -- she has developed another small blister on her right anterior chest wall in the area where she's had tape for her dialysis access. this may just be injury caused by a tape burn. 02/18/2015 -- no new blisters and she had a dermatology opinion and they have taken a biopsy of her skin. She also had a left brachial AV fistula placed this week. 03/03/2015 -- though we do not have the pathology report yet the patient says she has been put on prednisone because this skin disease is possibly to do with her immune system and her dermatologist is recommended this. 03/10/2015 -- she has developed a new blister which is quite large on her right lower  extremity on the shin. 05/22/2015 --  she did very well with her dressing but on trying to remove the [place] it peels away [name], [name] ([id number]) the newly healed skin. 06/05/2015 -- the patient was in the ER this past week for severe bleeding from the right nostril and had to have ENT see her and do a packing. They've also been holding her heparin during her dialysis. The patient is going back to ENT today. Other than that she has had no other significant issues. 06/19/2015 -- she is back on her blood thinners and continues to have her hemodialysis as before. Electronic Signature(s) Signed: 07/17/2015 9:40:17 AM By: [name] Companion MD Entered By: [name] on 07/17/2015 09:40:17 [name], [name] ([id number]) -------------------------------------------------------------------------------- Physical Exam Details [name], [name] 07/17/2015 8:45 Date of Service: AM Medical Record [id number] Number: Patient Account Number: [id number] 11/20/1955 (59 y.o. Treating RN: [name] Date of Birth/Sex: Female) Other Clinician: Primary Care Physician: [name], [name] Referring Physician: Nicky [name] [name]: [name] in Treatment: 29 Electronic Signature(s) Signed: 07/17/2015 9:40:26 AM By: [name] Companion MD Entered By: [name] on 07/17/2015 09:40:26 [name] ([id number]) -------------------------------------------------------------------------------- Physician Orders Details [name], [name] 07/17/2015 8:45 Date of Service: AM Medical Record [id number] Number: Patient Account Number: [id number] [birth year] (59 y.o. Treating RN: [name] Date of Birth/Sex: Female) Other Clinician: Primary Care Physician: [name], [name] Referring Physician: Nicky [name] [name]: [name] in Treatment: 29 Verbal / Phone Orders: Yes Clinician: Montey [name] Read Back and Verified:  Yes Diagnosis Coding Wound Cleansing Wound #1 Right Back o Clean wound with Normal Saline. Skin Barriers/Peri-Wound Care Wound #1 Right Back o Skin Prep Primary Wound Dressing Wound #1 Right Back o Other: - siltec sorbact if dressing needs to be changed at SNF please use the RTD and boardered foam dressing Secondary Dressing Wound #1 Right Back o Boardered Foam Dressing Dressing Change Frequency Wound #1 Right Back o Other: - twice a week if saturated if dressing needs to be changed please use the RTD and boardered foam dressing Follow-up Appointments Wound #1 Right Back o Return Appointment in 1 week. Electronic Signature(s) Signed: 07/17/2015 1:09:45 PM By: [name] Companion MD Signed: 07/17/2015 2:31:21 PM By: [name] Entered By: [name] on 07/17/2015 09:34:38 Hentz, [name] ([id number]) -------------------------------------------------------------------------------- Problem List Details [name], [name] 07/17/2015 8:45 Date of Service: AM Medical Record [id number] Number: Patient Account Number: [id number] [birth year] (59 y.o. Treating RN: [name] Date of Birth/Sex: Female) Other Clinician: Primary Care Physician: [name], Malakhai Beitler Referring Physician: Nicky [name] [name]: [name] in Treatment: 29 Active Problems ICD-10 Encounter Code Description Active Date Diagnosis E11.622 Type 2 diabetes mellitus with other skin ulcer 12/23/2014 Yes L89.112 Pressure ulcer of right upper back, stage 2 12/23/2014 Yes S80.821A Blister (nonthermal), right lower leg, initial encounter 12/23/2014 Yes N18.6 End stage renal disease 12/23/2014 Yes Inactive Problems Resolved Problems Electronic Signature(s) Signed: 07/17/2015 9:39:26 AM By: [name] Companion MD Entered By: [name] on 07/17/2015 09:39:25 Suman, [name]  ([id number]) -------------------------------------------------------------------------------- Progress Note Details [name], [name] 07/17/2015 8:45 Date of Service: AM Medical Record [id number] Number: Patient Account Number: [id number] 09/01/1955 (59 y.o. Treating RN: [name] Date of Birth/Sex: Female) Other Clinician: Primary Care Physician: [name], Nicholous Girgenti Referring Physician: Nicky [name] [name]: [name] in Treatment: 29 Subjective Chief Complaint Information obtained from Patient Patient presents to the wound care center for a consult due non healing wound. She has an open wound on her  right upper back which she's had for about a year and she recently noticed a blister on her right lower extremity about 2 [name] ago. History of Present Illness (HPI) The following HPI elements were documented for the patient's wound: Location: right upper back and right lower extremity wounds Quality: Patient reports No Pain. Severity: Patient states wound (s) are getting better. Duration: Patient has had the wound for > 3 months prior to seeking treatment at the wound center Timing: she thought it first occurred when she was using a heating pad about a year ago after back surgery. Context: The wound appeared gradually over time Modifying Factors: Patient is currently on renal dialysis and receives treatments 3 times weekly Associated Signs and Symptoms: Patient reports having: surgery scheduled for this week for a AV fistula left arm. 59 year old patient who is known to be a diabetic and has end-stage renal disease has had several comorbidities including coronary artery disease, hypertension, hyperlipidemia, pancreatitis, anemia, previous history of hysterectomy, cholecystectomy, left-sided salivary gland excision, bilateral cataract surgery,Peritoneal dialysis catheter, hemodialysis catheter. the area on the back has also been caused by  instant pressure she used to sleep on a recliner all day and has significant kyphoscoliosis. As far as the wound on her right lower extremity she's not sure how this blister occurred but she thought it has been there for about 2 [name]. No recent blood investigations available and no recent hemoglobin A1c. 12/30/2014 -- she is an assisted living facility but I believe the nurses that have not followed instructions as she had some cream applied on her back and there was a different dressing. Last week she's had a AV fistula placed on her left forearm. 01/13/2015 -- she has had some localized infection at the port site and she's been on doxycycline for this. 02/05/2015 - he has developed a small blister on her right lower extremity. 02/10/2015 -- she has developed another small blister on her right anterior chest wall in the area where she's had tape for her dialysis access. this may just be injury caused by a tape burn. [name], [name] ([id number]) 02/18/2015 -- no new blisters and she had a dermatology opinion and they have taken a biopsy of her skin. She also had a left brachial AV fistula placed this week. 03/03/2015 -- though we do not have the pathology report yet the patient says she has been put on prednisone because this skin disease is possibly to do with her immune system and her dermatologist is recommended this. 03/10/2015 -- she has developed a new blister which is quite large on her right lower extremity on the shin. 05/22/2015 -- she did very well with her dressing but on trying to remove the [place] it peels away the newly healed skin. 06/05/2015 -- the patient was in the ER this past week for severe bleeding from the right nostril and had to have ENT see her and do a packing. They've also been holding her heparin during her dialysis. The patient is going back to ENT today. Other than that she has had no other significant issues. 06/19/2015 -- she is back on her blood  thinners and continues to have her hemodialysis as before. Objective Constitutional Vitals Time Taken: 9:11 AM, Height: 65 in, Weight: 156 lbs, BMI: 26, Temperature: 97.8 F, Pulse: 86 bpm, Respiratory Rate: 18 breaths/min, Blood Pressure: 166/72 mmHg. Integumentary (Hair, Skin) Wound #1 status is Open. Original cause of wound was Blister. The wound is located on the Right Back. The  wound measures 2.9cm length x 2.8cm width x 0.1cm depth; 6.377cm^2 area and 0.638cm^3 volume. The wound is limited to skin breakdown. There is no tunneling or undermining noted. There is a large amount of sanguinous drainage noted. The wound margin is indistinct and nonvisible. There is large (67- 100%) red granulation within the wound bed. There is no necrotic tissue within the wound bed. The periwound skin appearance exhibited: Localized Edema, Moist. The periwound skin appearance did not exhibit: Callus, Crepitus, Excoriation, Fluctuance, Friable, Induration, Rash, Scarring, Dry/Scaly, Maceration, Atrophie Blanche, Cyanosis, Ecchymosis, Hemosiderin Staining, Mottled, Pallor, Rubor, Erythema. Periwound temperature was noted as No Abnormality. The periwound has tenderness on palpation. Wound #7 status is Healed - Epithelialized. Original cause of wound was Gradually Appeared. The wound is located on the Right,Distal,Midline Lower Leg. The wound measures 0cm length x 0cm width x 0cm depth; 0cm^2 area and 0cm^3 volume. Assessment [name], [name] ([id number]) Active Problems ICD-10 E11.622 - Type 2 diabetes mellitus with other skin ulcer L89.112 - Pressure ulcer of right upper back, stage 2 S80.821A - Blister (nonthermal), right lower leg, initial encounter N18.6 - End stage renal disease Plan Wound Cleansing: Wound #1 Right Back: Clean wound with Normal Saline. Skin Barriers/Peri-Wound Care: Wound #1 Right Back: Skin Prep Primary Wound Dressing: Wound #1 Right Back: Other: - siltec sorbact if dressing  needs to be changed at SNF please use the RTD and boardered foam dressing Secondary Dressing: Wound #1 Right Back: Boardered Foam Dressing Dressing Change Frequency: Wound #1 Right Back: Other: - twice a week if saturated if dressing needs to be changed please use the RTD and boardered foam dressing Follow-up Appointments: Wound #1 Right Back: Return Appointment in 1 week. Follow-Up Appointments: A Patient Clinical Summary of Care was provided to LD Stage 2 pressure ulcer of upper back about 4 cm. Treat with Sorbact dressing daily. Diabetic on dialysis. [name], [name] ([id number]) Electronic Signature(s) Signed: 07/17/2015 9:42:48 AM By: [name] Companion MD Entered By: [name] on 07/17/2015 09:42:48 [name], [name] ([id number]) -------------------------------------------------------------------------------- SuperBill Details [name]. Date of Service: 07/17/2015 Medical Record Number: [id number] Patient Account Number: [id number] Date of Birth/Sex: 09/16/1955 (59 y.o. Female) Treating RN: [name] Primary Care Physician: [name] Other Clinician: Referring Physician: Nicky [name] Treating [name]: Benjaman Pott in Treatment: 29 Diagnosis Coding ICD-10 Codes Code Description E11.622 Type 2 diabetes mellitus with other skin ulcer L89.112 Pressure ulcer of right upper back, stage 2 S80.821A Blister (nonthermal), right lower leg, initial encounter N18.6 End stage renal disease Facility Procedures CPT4 Code: [id number] Description: [id number] - WOUND CARE VISIT-LEV 3 EST PT Modifier: Quantity: 1 Physician Procedures CPT4 Code: [id number] Description: [id number] - WC PHYS LEVEL 2 - EST PT ICD-10 Description Diagnosis L89.112 Pressure ulcer of right upper back, stage 2 Modifier: Quantity: 1 Electronic Signature(s) Signed: 07/17/2015 1:09:45 PM By: [name] Companion MD Signed: 07/17/2015 2:31:21 PM By: [name] Previous Signature:  07/17/2015 9:43:19 AM Version By: [name] Companion MD Entered By: [name] on 07/17/2015 10:39:37

## 2015-07-18 NOTE — Progress Notes (Signed)
JENNFER, SONIA ([id number]) Visit Report for 07/17/2015 Arrival Information Details Patient Name: [name], [name]. Date of Service: 07/17/2015 8:45 AM Medical Record Number: [id number] Patient Account Number: [id number] Date of Birth/Sex: 12-28-1955 (59 y.o. Female) Treating RN: Montey Hora Primary Care Physician: [name] Other Clinician: Referring Physician: [name] Treating Physician/Extender: Benjaman Pott in Treatment: 29 Visit Information History Since Last Visit Added or deleted any medications: No Patient Arrived: Walker Any new allergies or adverse reactions: No Arrival Time: 09:09 Had a fall or experienced change in No Accompanied By: self activities of daily living that may affect Transfer Assistance: None risk of falls: Patient Identification Verified: Yes Signs or symptoms of abuse/neglect since last No Secondary Verification Process Yes visito Completed: Hospitalized since last visit: No Patient Requires Transmission- No Pain Present Now: No Based Precautions: Patient Has Alerts: Yes Patient Alerts: Patient on Blood Thinner ABI R: 1.28 Electronic Signature(s) Signed: 07/17/2015 2:31:21 PM By: Montey Hora Entered By: Montey Hora on 07/17/2015 09:09:35 Davidow, Sarrah S. ([id number]) -------------------------------------------------------------------------------- Clinic Level of Care Assessment Details [name]. Date of Service: 07/17/2015 8:45 AM Medical Record Number: [id number] Patient Account Number: [id number] Date of Birth/Sex: July 16, 1956 (59 y.o. Female) Treating RN: Montey Hora Primary Care Physician: [name] Other Clinician: Referring Physician: [name] Treating Physician/Extender: Benjaman Pott in Treatment: 29 Clinic Level of Care Assessment Items TOOL 4 Quantity Score []  - Use when only an EandM is performed on FOLLOW-UP visit 0 ASSESSMENTS - Nursing Assessment / Reassessment X -  Reassessment of Co-morbidities (includes updates in patient status) 1 10 X - Reassessment of Adherence to Treatment Plan 1 5 ASSESSMENTS - Wound and Skin Assessment / Reassessment []  - Simple Wound Assessment / Reassessment - one wound 0 X - Complex Wound Assessment / Reassessment - multiple wounds 2 5 []  - Dermatologic / Skin Assessment (not related to wound area) 0 ASSESSMENTS - Focused Assessment []  - Circumferential Edema Measurements - multi extremities 0 []  - Nutritional Assessment / Counseling / Intervention 0 []  - Lower Extremity Assessment (monofilament, tuning fork, pulses) 0 []  - Peripheral Arterial Disease Assessment (using hand held doppler) 0 ASSESSMENTS - Ostomy and/or Continence Assessment and Care []  - Incontinence Assessment and Management 0 []  - Ostomy Care Assessment and Management (repouching, etc.) 0 PROCESS - Coordination of Care X - Simple Patient / Family Education for ongoing care 1 15 []  - Complex (extensive) Patient / Family Education for ongoing care 0 []  - Staff obtains Programmer, systems, Records, Test Results / Process Orders 0 []  - Staff telephones HHA, Nursing Homes / Clarify orders / etc 0 []  - Routine Transfer to another Facility (non-emergent condition) 0 Fenoglio, Allyse S. ([id number]) []  - Routine Hospital Admission (non-emergent condition) 0 []  - New Admissions / Biomedical engineer / Ordering NPWT, Apligraf, etc. 0 []  - Emergency Hospital Admission (emergent condition) 0 X - Simple Discharge Coordination 1 10 []  - Complex (extensive) Discharge Coordination 0 PROCESS - Special Needs []  - Pediatric / Minor Patient Management 0 []  - Isolation Patient Management 0 []  - Hearing / Language / Visual special needs 0 []  - Assessment of Community assistance (transportation, D/C planning, etc.) 0 []  - Additional assistance / Altered mentation 0 []  - Support Surface(s) Assessment (bed, cushion, seat, etc.) 0 INTERVENTIONS - Wound Cleansing / Measurement []  -  Simple Wound Cleansing - one wound 0 X - Complex Wound Cleansing - multiple wounds 2 5 X - Wound Imaging (photographs - any number of wounds) 1 5 []  -  Wound Tracing (instead of photographs) 0 []  - Simple Wound Measurement - one wound 0 X - Complex Wound Measurement - multiple wounds 2 5 INTERVENTIONS - Wound Dressings X - Small Wound Dressing one or multiple wounds 1 10 []  - Medium Wound Dressing one or multiple wounds 0 []  - Large Wound Dressing one or multiple wounds 0 []  - Application of Medications - topical 0 []  - Application of Medications - injection 0 INTERVENTIONS - Miscellaneous []  - External ear exam 0 Presas, Aylla S. ([id number]) []  - Specimen Collection (cultures, biopsies, blood, body fluids, etc.) 0 []  - Specimen(s) / Culture(s) sent or taken to Lab for analysis 0 []  - Patient Transfer (multiple staff / Harrel Lemon Lift / Similar devices) 0 []  - Simple Staple / Suture removal (25 or less) 0 []  - Complex Staple / Suture removal (26 or more) 0 []  - Hypo / Hyperglycemic Management (close monitor of Blood Glucose) 0 []  - Ankle / Brachial Index (ABI) - do not check if billed separately 0 X - Vital Signs 1 5 Has the patient been seen at the hospital within the last three years: Yes Total Score: 90 Level Of Care: New/Established - Level 3 Electronic Signature(s) Signed: 07/17/2015 2:31:21 PM By: Montey Hora Entered By: Montey Hora on 07/17/2015 10:39:27 Deady, [name] ([id number]) -------------------------------------------------------------------------------- Encounter Discharge Information Details Patient Name: [name], [name] Date of Service: 07/17/2015 8:45 AM Medical Record Number: [id number] Patient Account Number: [id number] Date of Birth/Sex: 1956/01/27 (59 y.o. Female) Treating RN: Montey Hora Primary Care Physician: [name] Other Clinician: Referring Physician: [name] Treating Physician/Extender: Benjaman Pott in Treatment: 29 Encounter  Discharge Information Items Discharge Pain Level: 0 Discharge Condition: Stable Ambulatory Status: Walker Discharge Destination: Home Transportation: Private Auto Accompanied By: SELF Schedule Follow-up Appointment: Yes Medication Reconciliation completed and provided to Patient/Care No Coyle Stordahl: Provided on Clinical Summary of Care: 07/17/2015 Form Type Recipient Paper Patient LD Electronic Signature(s) Signed: 07/17/2015 2:31:21 PM By: Montey Hora Previous Signature: 07/17/2015 9:43:46 AM Version By: Judene Companion MD Previous Signature: 07/17/2015 9:37:27 AM Version By: Sharon Mt Entered By: Montey Hora on 07/17/2015 10:40:20 Ross, [name] ([id number]) -------------------------------------------------------------------------------- Multi Wound Chart Details Patient Name: [name], [name] Date of Service: 07/17/2015 8:45 AM Medical Record Number: [id number] Patient Account Number: [id number] Date of Birth/Sex: July 21, 1955 (59 y.o. Female) Treating RN: Montey Hora Primary Care Physician: [name] Other Clinician: Referring Physician: [name] Treating Physician/Extender: Benjaman Pott in Treatment: 29 Vital Signs Height(in): 65 Pulse(bpm): 86 Weight(lbs): 156 Blood Pressure 166/72 (mmHg): Body Mass Index(BMI): 26 Temperature(F): 97.8 Respiratory Rate 18 (breaths/min): Photos: [1:No Photos] [7:No Photos] [N/A:N/A] Wound Location: [1:Right Back] [7:Right, Distal, Midline Lower Leg] [N/A:N/A] Wounding Event: [1:Blister] [7:Gradually Appeared] [N/A:N/A] Primary Etiology: [1:Pressure Ulcer] [7:Venous Leg Ulcer] [N/A:N/A] Comorbid History: [1:Glaucoma, Anemia, Coronary Artery Disease, Hypertension, Type II Diabetes, End Stage Renal Disease, Osteoarthritis, Neuropathy] [7:N/A] [N/A:N/A] Date Acquired: [1:10/19/2013] [7:06/30/2015] [N/A:N/A] Weeks of Treatment: [1:29] [7:2] [N/A:N/A] Wound Status: [1:Open] [7:Healed - Epithelialized]  [N/A:N/A] Measurements L x W x D 2.9x2.8x0.1 [7:0x0x0] [N/A:N/A] (cm) Area (cm) : [1:6.377] [7:0] [N/A:N/A] Volume (cm) : [1:0.638] [7:0] [N/A:N/A] % Reduction in Area: [1:83.40%] [7:100.00%] [N/A:N/A] % Reduction in Volume: 83.40% [7:100.00%] [N/A:N/A] Classification: [1:Category/Stage II] [7:Partial Thickness] [N/A:N/A] Exudate Amount: [1:Large] [7:N/A] [N/A:N/A] Exudate Type: [1:Sanguinous] [7:N/A] [N/A:N/A] Exudate Color: [1:red] [7:N/A] [N/A:N/A] Wound Margin: [1:Indistinct, nonvisible] [7:N/A] [N/A:N/A] Granulation Amount: [1:Large (67-100%)] [7:N/A] [N/A:N/A] Granulation Quality: [1:Red] [7:N/A] [N/A:N/A] Necrotic Amount: [1:None Present (0%)] [7:N/A] [N/A:N/A] Exposed Structures: [1:Fascia: No Fat: No] [7:N/A] [  N/A:N/A] Tendon: No Muscle: No Joint: No Bone: No Limited to Skin Breakdown Epithelialization: None N/A N/A Periwound Skin Texture: Edema: Yes No Abnormalities Noted N/A Excoriation: No Induration: No Callus: No Crepitus: No Fluctuance: No Friable: No Rash: No Scarring: No Periwound Skin Moist: Yes No Abnormalities Noted N/A Moisture: Maceration: No Dry/Scaly: No Periwound Skin Color: Atrophie Blanche: No No Abnormalities Noted N/A Cyanosis: No Ecchymosis: No Erythema: No Hemosiderin Staining: No Mottled: No Pallor: No Rubor: No Temperature: No Abnormality N/A N/A Tenderness on Yes No N/A Palpation: Wound Preparation: Ulcer Cleansing: N/A N/A Rinsed/Irrigated with Saline Topical Anesthetic Applied: None Treatment Notes Electronic Signature(s) Signed: 07/17/2015 2:31:21 PM By: Montey Hora Entered By: Montey Hora on 07/17/2015 09:33:38 Baudoin, [name] ([id number]) -------------------------------------------------------------------------------- [place] Level Details [name]. Date of Service: 07/17/2015 8:45 AM Medical Record Number: [id number] Patient Account Number: [id number] Date of Birth/Sex:  [birth year] (59 y.o. Female) Treating RN: Montey Hora Primary Care Physician: [name] Other Clinician: Referring Physician: [name] Treating Physician/Extender: Benjaman Pott in Treatment: 86 Active Inactive Abuse / Safety / Falls / Self Care Management Nursing Diagnoses: Potential for falls Goals: Patient will remain injury free Date Initiated: 12/23/2014 Goal Status: Active Patient/caregiver will verbalize understanding of skin care regimen Date Initiated: 12/23/2014 Goal Status: Active Patient/caregiver will verbalize/demonstrate measures taken to prevent injury and/or falls Date Initiated: 12/23/2014 Goal Status: Active Patient/caregiver will verbalize/demonstrate understanding of what to do in case of emergency Date Initiated: 12/23/2014 Goal Status: Active Interventions: Assess fall risk on admission and as needed Assess: immobility, friction, shearing, incontinence upon admission and as needed Assess impairment of mobility on admission and as needed per policy Assess self care needs on admission and as needed Provide education on fall prevention Notes: Wound/Skin Impairment Nursing Diagnoses: Impaired tissue integrity Knowledge deficit related to ulceration/compromised skin integrity Goals: Patient/caregiver will verbalize understanding of skin care regimen ROLLENE, GELPI ([id number]) Date Initiated: 12/23/2014 Goal Status: Active Ulcer/skin breakdown will heal within 14 weeks Date Initiated: 12/23/2014 Goal Status: Active Interventions: Assess patient/caregiver ability to obtain necessary supplies Assess patient/caregiver ability to perform ulcer/skin care regimen upon admission and as needed Assess ulceration(s) every visit Provide education on ulcer and skin care Treatment Activities: Skin care regimen initiated : 07/17/2015 Topical wound management initiated : 07/17/2015 Notes: Electronic Signature(s) Signed: 07/17/2015 2:31:21 PM By: Montey Hora Entered By: Montey Hora on 07/17/2015 09:33:31 Hazen, [name] ([id number]) -------------------------------------------------------------------------------- Patient/Caregiver Education Details [name]. Date of Service: 07/17/2015 8:45 AM Medical Record Number: [id number] Patient Account Number: [id number] Date of Birth/Gender: [birth year] (59 y.o. Female) Treating RN: Montey Hora Primary Care Physician: [name] Other Clinician: Referring Physician: [name] Treating Physician/Extender: Benjaman Pott in Treatment: 35 Education Assessment Education Provided To: Patient Education Topics Provided Wound/Skin Impairment: Handouts: Other: WOUND CARE AS ORDERED Methods: Demonstration, Explain/Verbal Responses: State content correctly Electronic Signature(s) Signed: 07/17/2015 2:31:21 PM By: Montey Hora Previous Signature: 07/17/2015 9:43:58 AM Version By: Judene Companion MD Entered By: Montey Hora on 07/17/2015 10:42:25 Ord, [name] ([id number]) -------------------------------------------------------------------------------- Wound Assessment Details Patient Name: Lehner, Airabella S. Date of Service: 07/17/2015 8:45 AM Medical Record Number: [id number] Patient Account Number: [id number] Date of Birth/Sex: 1956/04/29 (59 y.o. Female) Treating RN: Montey Hora Primary Care Physician: [name] Other Clinician: Referring Physician: [name] Treating Physician/Extender: Benjaman Pott in Treatment: 29 Wound Status Wound Number: 1 Primary Pressure Ulcer Etiology: Wound Location: Right Back Wound Open Wounding Event: Blister Status: Date Acquired: 10/19/2013 Comorbid Glaucoma,  Anemia, Coronary Artery Weeks Of Treatment: 29 History: Disease, Hypertension, Type II Clustered Wound: No Diabetes, End Stage Renal Disease, Osteoarthritis, Neuropathy Photos Photo Uploaded By: Montey Hora on 07/17/2015 13:51:52 Wound  Measurements Length: (cm) 2.9 Width: (cm) 2.8 Depth: (cm) 0.1 Area: (cm) 6.377 Volume: (cm) 0.638 % Reduction in Area: 83.4% % Reduction in Volume: 83.4% Epithelialization: None Tunneling: No Undermining: No Wound Description Classification: Category/Stage II Wound Margin: Indistinct, nonvisible Exudate Amount: Large Exudate Type: Sanguinous Exudate Color: red Foul Odor After Cleansing: No Wound Bed Granulation Amount: Large (67-100%) Exposed Structure Granulation Quality: Red Fascia Exposed: No Necrotic Amount: None Present (0%) Fat Layer Exposed: No Bias, Carle S. ([id number]) Tendon Exposed: No Muscle Exposed: No Joint Exposed: No Bone Exposed: No Limited to Skin Breakdown Periwound Skin Texture Texture Color No Abnormalities Noted: No No Abnormalities Noted: No Callus: No Atrophie Blanche: No Crepitus: No Cyanosis: No Excoriation: No Ecchymosis: No Fluctuance: No Erythema: No Friable: No Hemosiderin Staining: No Induration: No Mottled: No Localized Edema: Yes Pallor: No Rash: No Rubor: No Scarring: No Temperature / Pain Moisture Temperature: No Abnormality No Abnormalities Noted: No Tenderness on Palpation: Yes Dry / Scaly: No Maceration: No Moist: Yes Wound Preparation Ulcer Cleansing: Rinsed/Irrigated with Saline Topical Anesthetic Applied: None Treatment Notes Wound #1 (Right Back) 1. Cleansed with: Clean wound with Normal Saline 3. Peri-wound Care: Skin Prep 4. Dressing Applied: Other dressing (specify in notes) Notes siltec sorbact Electronic Signature(s) Signed: 07/17/2015 2:31:21 PM By: Montey Hora Entered By: Montey Hora on 07/17/2015 09:30:06 Mccalister, [name] ([id number]) -------------------------------------------------------------------------------- Wound Assessment Details Patient Name: Swamy, Brenetta S. Date of Service: 07/17/2015 8:45 AM Medical Record Number: [id number] Patient Account Number: [id number] Date of  Birth/Sex: 06-12-1956 (59 y.o. Female) Treating RN: Montey Hora Primary Care Physician: [name] Other Clinician: Referring Physician: [name] Treating Physician/Extender: Benjaman Pott in Treatment: 29 Wound Status Wound Number: 7 Primary Etiology: Venous Leg Ulcer Wound Location: Right, Distal, Midline Lower Leg Wound Status: Healed - Epithelialized Wounding Event: Gradually Appeared Date Acquired: 06/30/2015 Weeks Of Treatment: 2 Clustered Wound: No Photos Photo Uploaded By: Montey Hora on 07/17/2015 13:51:52 Wound Measurements Length: (cm) 0 % Reduction Width: (cm) 0 % Reduction Depth: (cm) 0 Area: (cm) 0 Volume: (cm) 0 in Area: 100% in Volume: 100% Wound Description Classification: Partial Thickness Periwound Skin Texture Texture Color No Abnormalities Noted: No No Abnormalities Noted: No Moisture No Abnormalities Noted: No Electronic Signature(s) Signed: 07/17/2015 2:31:21 PM By: Posey Pronto ([id number]) Entered By: Montey Hora on 07/17/2015 09:18:45 Monteverde, Tianna S. ([id number]) -------------------------------------------------------------------------------- Vitals Details Patient Name: [name], [name] Date of Service: 07/17/2015 8:45 AM Medical Record Number: [id number] Patient Account Number: [id number] Date of Birth/Sex: [birth year] (59 y.o. Female) Treating RN: Montey Hora Primary Care Physician: [name] Other Clinician: Referring Physician: [name] Treating Physician/Extender: Benjaman Pott in Treatment: 29 Vital Signs Time Taken: 09:11 Temperature (F): 97.8 Height (in): 65 Pulse (bpm): 86 Weight (lbs): 156 Respiratory Rate (breaths/min): 18 Body Mass Index (BMI): 26 Blood Pressure (mmHg): 166/72 Reference Range: 80 - 120 mg / dl Electronic Signature(s) Signed: 07/17/2015 2:31:21 PM By: Montey Hora Entered By: Montey Hora on 07/17/2015 09:11:33

## 2015-07-24 ENCOUNTER — Encounter: Payer: Medicare Other | Attending: Surgery | Admitting: Surgery

## 2015-07-24 DIAGNOSIS — E1165 Type 2 diabetes mellitus with hyperglycemia: Secondary | ICD-10-CM | POA: Insufficient documentation

## 2015-07-24 DIAGNOSIS — E1122 Type 2 diabetes mellitus with diabetic chronic kidney disease: Secondary | ICD-10-CM | POA: Insufficient documentation

## 2015-07-24 DIAGNOSIS — E785 Hyperlipidemia, unspecified: Secondary | ICD-10-CM | POA: Insufficient documentation

## 2015-07-24 DIAGNOSIS — N186 End stage renal disease: Secondary | ICD-10-CM | POA: Diagnosis not present

## 2015-07-24 DIAGNOSIS — Z9889 Other specified postprocedural states: Secondary | ICD-10-CM | POA: Insufficient documentation

## 2015-07-24 DIAGNOSIS — I120 Hypertensive chronic kidney disease with stage 5 chronic kidney disease or end stage renal disease: Secondary | ICD-10-CM | POA: Diagnosis not present

## 2015-07-24 DIAGNOSIS — E11622 Type 2 diabetes mellitus with other skin ulcer: Secondary | ICD-10-CM | POA: Diagnosis not present

## 2015-07-24 DIAGNOSIS — L89112 Pressure ulcer of right upper back, stage 2: Secondary | ICD-10-CM | POA: Diagnosis not present

## 2015-07-24 DIAGNOSIS — K859 Acute pancreatitis without necrosis or infection, unspecified: Secondary | ICD-10-CM | POA: Insufficient documentation

## 2015-07-24 DIAGNOSIS — S80821A Blister (nonthermal), right lower leg, initial encounter: Secondary | ICD-10-CM | POA: Diagnosis not present

## 2015-07-25 NOTE — Progress Notes (Addendum)
KAMETRIA, GOSHERT ([id number]) Visit Report for 07/24/2015 Chief Complaint Document Details [name], [name]. Date of Service: 07/24/2015 2:15 PM Medical Record Patient Account Number: [id number] Number: [name], RN, [name], Treating RN: 04/15/1956 (60 y.o. [name] Date of Birth/Sex: Female) Other Clinician: Primary Care Physician: Brynda Greathouse, ERNEST Treating Rilan Eiland Referring Physician: [name] Physician/Extender: [name]: 30 Information Obtained from: Patient Chief Complaint Patient presents to the wound care center for a consult due non healing wound. She has an open wound on her right upper back which she's had for about a year and she recently noticed a blister on her right lower extremity about 2 weeks ago. Electronic Signature(s) Signed: 07/24/2015 2:19:06 PM By: [name], [name] Entered By: [name] on 07/24/2015 14:19:06 Pelzer, [name] ([id number]) -------------------------------------------------------------------------------- HPI Details Patient Name: [name], [name]. Date of Service: 07/24/2015 2:15 PM Medical Record Patient Account Number: [id number] Number: [name], RN, [name], Treating RN: [birth year] (60 y.o. [name] Date of Birth/Sex: Female) Other Clinician: Primary Care Physician: Brynda Greathouse, ERNEST Treating Alexyia Guarino Referring Physician: [name] Physician/Extender: [name]: 30 History of Present Illness Location: right upper back and right lower extremity wounds Quality: Patient reports No Pain. Severity: Patient states wound (s) are getting better. Duration: Patient has had the wound for > 3 months prior to seeking treatment at the wound center Timing: she thought it first occurred when she was using a heating pad about a year ago after back surgery. Context: The wound appeared gradually over time Modifying Factors: Patient is currently on renal dialysis and receives treatments 3 times  weekly Associated Signs and Symptoms: Patient reports having: surgery scheduled for this week for a AV fistula left arm. HPI Description: 60 year old patient who is known to be a diabetic and has end-stage renal disease has had several comorbidities including coronary artery disease, hypertension, hyperlipidemia, pancreatitis, anemia, previous history of hysterectomy, cholecystectomy, left-sided salivary gland excision, bilateral cataract surgery,Peritoneal dialysis catheter, hemodialysis catheter. the area on the back has also been caused by instant pressure she used to sleep on a recliner all day and has significant kyphoscoliosis. As far as the wound on her right lower extremity she's not sure how this blister occurred but she thought it has been there for about 2 weeks. No recent blood investigations available and no recent hemoglobin A1c. 12/30/2014 -- she is an assisted living facility but I believe the nurses that have not followed instructions as she had some cream applied on her back and there was a different dressing. Last week she's had a AV fistula placed on her left forearm. 01/13/2015 -- she has had some localized infection at the port site and she's been on doxycycline for this. 02/05/2015 - he has developed a small blister on her right lower extremity. 02/10/2015 -- she has developed another small blister on her right anterior chest wall in the area where she's had tape for her dialysis access. this may just be injury caused by a tape burn. 02/18/2015 -- no new blisters and she had a dermatology opinion and they have taken a biopsy of her skin. She also had a left brachial AV fistula placed this week. 03/03/2015 -- though we do not have the pathology report yet the patient says she has been put on prednisone because this skin disease is possibly to do with her immune system and her dermatologist is recommended this. 03/10/2015 -- she has developed a new blister which is quite  large on her right lower  extremity on the shin. 05/22/2015 -- she did very well with her dressing but on trying to remove the [place] it peels away Galindo, Louis S. ([id number]) the newly healed skin. 06/05/2015 -- the patient was in the ER this past week for severe bleeding from the right nostril and had to have ENT see her and do a packing. They've also been holding her heparin during her dialysis. The patient is going back to ENT today. Other than that she has had no other significant issues. 06/19/2015 -- she is back on her blood thinners and continues to have her hemodialysis as before. Electronic Signature(s) Signed: 07/24/2015 2:20:15 PM By: [name], [name] Entered By: [name] on 07/24/2015 14:20:15 Mey, [name] ([id number]) -------------------------------------------------------------------------------- Physical Exam Details Patient Name: [name], [name] S. Date of Service: 07/24/2015 2:15 PM Medical Record Patient Account Number: [id number] Number: [name], RN, [name], Treating RN: 1956-02-09 (60 y.o. [name] Date of Birth/Sex: Female) Other Clinician: Primary Care Physician: Brynda Greathouse, ERNEST Treating Laneisha Mino Referring Physician: [name] Physician/Extender: [name]: 30 Constitutional . Pulse regular. Respirations normal and unlabored. Afebrile. . Eyes Nonicteric. Reactive to light. Ears, Nose, Mouth, and Throat Lips, teeth, and gums WNL.Marland Kitchen Moist mucosa without lesions. Neck supple and nontender. No palpable supraclavicular or cervical adenopathy. Normal sized without goiter. Respiratory WNL. No retractions.. Cardiovascular Pedal Pulses WNL. No clubbing, cyanosis or edema. Lymphatic No adneopathy. No adenopathy. No adenopathy. Musculoskeletal Adexa without tenderness or enlargement.. Digits and nails w/o clubbing, cyanosis, infection, petechiae, ischemia, or inflammatory conditions.. Integumentary (Hair, Skin) No suspicious  lesions. No crepitus or fluctuance. No peri-wound warmth or erythema. No masses.Marland Kitchen Psychiatric Judgement and insight Intact.. No evidence of depression, anxiety, or agitation.. Notes the wound is looking excellent and this has been the best it has ever looked. No debridement was required. Electronic Signature(s) Signed: 07/24/2015 2:20:44 PM By: [name], [name] Entered By: [name] on 07/24/2015 14:20:43 Minasyan, [name] ([id number]) -------------------------------------------------------------------------------- Physician Orders Details Patient Name: [name] Date of Service: 07/24/2015 2:15 PM Medical Record Patient Account Number: [id number] Number: [name], RN, [name], Treating RN: 02-Jul-1956 (60 y.o. [name] Date of Birth/Sex: Female) Other Clinician: Primary Care Physician: Brynda Greathouse, ERNEST Treating Alyviah Crandle Referring Physician: [name] Physician/Extender: Suella [place] in Treatment: 30 Verbal / Phone Orders: Yes Clinician: [name], RN, [name], [name] Read Back and Verified: Yes Diagnosis Coding ICD-10 Coding Code Description E11.622 Type 2 diabetes mellitus with other skin ulcer L89.112 Pressure ulcer of right upper back, stage 2 S80.821A Blister (nonthermal), right lower leg, initial encounter N18.6 End stage renal disease Wound Cleansing Wound #1 Right Back o Clean wound with Normal Saline. Skin Barriers/Peri-Wound Care Wound #1 Right Back o Skin Prep Primary Wound Dressing Wound #1 Right Back o Other: - siltec sorbact......... if dressing needs to be changed at SNF please use the RTD and boardered foam dressing Dressing Change Frequency Wound #1 Right Back o Other: - twice a week if saturated if dressing needs to be changed please use the RTD and boardered foam dressing Follow-up Appointments Wound #1 Right Back o Return Appointment in 1 week. Electronic Signature(s) Signed: 07/24/2015 4:57:48 PM By: Regan Lemming [name], RN Signed: 07/24/2015  5:05:49 PM By: [name], [name] Kerin, [name] ([id number]) Entered By: Regan Lemming on 07/24/2015 14:21:35 Bednarczyk, [name] ([id number]) -------------------------------------------------------------------------------- Problem List Details Patient Name: MEILIN, LICURSI. Date of Service: 07/24/2015 2:15 PM Medical Record Patient Account Number: [id number] Number: [name], RN, [name], Treating RN: September 30, 1955 (60 y.o.  [name] Date of Birth/Sex: Female) Other Clinician: Primary Care Physician: [name] Treating [name] Referring Physician: [name] Physician/Extender: [name]: 30 Active Problems ICD-10 Encounter Code Description Active Date Diagnosis E11.622 Type 2 diabetes mellitus with other skin ulcer 12/23/2014 Yes L89.112 Pressure ulcer of right upper back, stage 2 12/23/2014 Yes S80.821A Blister (nonthermal), right lower leg, initial encounter 12/23/2014 Yes N18.6 End stage renal disease 12/23/2014 Yes Inactive Problems Resolved Problems Electronic Signature(s) Signed: 07/24/2015 2:18:59 PM By: [name], [name] Entered By: [name] on 07/24/2015 14:18:59 Aungst, [name] ([id number]) -------------------------------------------------------------------------------- Progress Note Details Patient Name: Dunklee, Jerae S. Date of Service: 07/24/2015 2:15 PM Medical Record Patient Account Number: [id number] Number: [name], RN, [name], Treating RN: 03-Jan-1956 (60 y.o. [name] Date of Birth/Sex: Female) Other Clinician: Primary Care Physician: Brynda Greathouse, ERNEST Treating Aaron Bostwick Referring Physician: [name] Physician/Extender: [name]: 30 Subjective Chief Complaint Information obtained from Patient Patient presents to the wound care center for a consult due non healing wound. She has an open wound on her right upper back which she's had for about a year and she recently noticed a blister on her right lower extremity about 2  weeks ago. History of Present Illness (HPI) The following HPI elements were documented for the patient's wound: Location: right upper back and right lower extremity wounds Quality: Patient reports No Pain. Severity: Patient states wound (s) are getting better. Duration: Patient has had the wound for > 3 months prior to seeking treatment at the wound center Timing: she thought it first occurred when she was using a heating pad about a year ago after back surgery. Context: The wound appeared gradually over time Modifying Factors: Patient is currently on renal dialysis and receives treatments 3 times weekly Associated Signs and Symptoms: Patient reports having: surgery scheduled for this week for a AV fistula left arm. 60 year old patient who is known to be a diabetic and has end-stage renal disease has had several comorbidities including coronary artery disease, hypertension, hyperlipidemia, pancreatitis, anemia, previous history of hysterectomy, cholecystectomy, left-sided salivary gland excision, bilateral cataract surgery,Peritoneal dialysis catheter, hemodialysis catheter. the area on the back has also been caused by instant pressure she used to sleep on a recliner all day and has significant kyphoscoliosis. As far as the wound on her right lower extremity she's not sure how this blister occurred but she thought it has been there for about 2 weeks. No recent blood investigations available and no recent hemoglobin A1c. 12/30/2014 -- she is an assisted living facility but I believe the nurses that have not followed instructions as she had some cream applied on her back and there was a different dressing. Last week she's had a AV fistula placed on her left forearm. 01/13/2015 -- she has had some localized infection at the port site and she's been on doxycycline for this. 02/05/2015 - he has developed a small blister on her right lower extremity. 02/10/2015 -- she has developed another small  blister on her right anterior chest wall in the area where she's had tape for her dialysis access. this may just be injury caused by a tape burn. AUTUMNE, CEPIN ([id number]) 02/18/2015 -- no new blisters and she had a dermatology opinion and they have taken a biopsy of her skin. She also had a left brachial AV fistula placed this week. 03/03/2015 -- though we do not have the pathology report yet the patient says she has been put on prednisone because this skin disease is possibly  to do with her immune system and her dermatologist is recommended this. 03/10/2015 -- she has developed a new blister which is quite large on her right lower extremity on the shin. 05/22/2015 -- she did very well with her dressing but on trying to remove the [place] it peels away the newly healed skin. 06/05/2015 -- the patient was in the ER this past week for severe bleeding from the right nostril and had to have ENT see her and do a packing. They've also been holding her heparin during her dialysis. The patient is going back to ENT today. Other than that she has had no other significant issues. 06/19/2015 -- she is back on her blood thinners and continues to have her hemodialysis as before. Objective Constitutional Pulse regular. Respirations normal and unlabored. Afebrile. Vitals Time Taken: 2:06 PM, Height: 65 in, Weight: 156 lbs, BMI: 26, Temperature: 98.1 F, Pulse: 91 bpm, Respiratory Rate: 18 breaths/min, Blood Pressure: 175/87 mmHg. Eyes Nonicteric. Reactive to light. Ears, Nose, Mouth, and Throat Lips, teeth, and gums WNL.Marland Kitchen Moist mucosa without lesions. Neck supple and nontender. No palpable supraclavicular or cervical adenopathy. Normal sized without goiter. Respiratory WNL. No retractions.. Cardiovascular Pedal Pulses WNL. No clubbing, cyanosis or edema. Lymphatic No adneopathy. No adenopathy. No adenopathy. Musculoskeletal Lovingood, Torri S. ([id number]) Adexa without tenderness or  enlargement.. Digits and nails w/o clubbing, cyanosis, infection, petechiae, ischemia, or inflammatory conditions.Marland Kitchen Psychiatric Judgement and insight Intact.. No evidence of depression, anxiety, or agitation.. General Notes: the wound is looking excellent and this has been the best it has ever looked. No debridement was required. Integumentary (Hair, Skin) No suspicious lesions. No crepitus or fluctuance. No peri-wound warmth or erythema. No masses.. Wound #1 status is Open. Original cause of wound was Blister. The wound is located on the Right Back. The wound measures 1.3cm length x 1cm width x 0.1cm depth; 1.021cm^2 area and 0.102cm^3 volume. The wound is limited to skin breakdown. There is no tunneling or undermining noted. There is a large amount of sanguinous drainage noted. The wound margin is indistinct and nonvisible. There is large (67- 100%) red granulation within the wound bed. There is no necrotic tissue within the wound bed. The periwound skin appearance exhibited: Moist. The periwound skin appearance did not exhibit: Callus, Crepitus, Excoriation, Fluctuance, Friable, Induration, Localized Edema, Rash, Scarring, Dry/Scaly, Maceration, Atrophie Blanche, Cyanosis, Ecchymosis, Hemosiderin Staining, Mottled, Pallor, Rubor, Erythema. Periwound temperature was noted as No Abnormality. The periwound has tenderness on palpation. Assessment Active Problems ICD-10 E11.622 - Type 2 diabetes mellitus with other skin ulcer L89.112 - Pressure ulcer of right upper back, stage 2 S80.821A - Blister (nonthermal), right lower leg, initial encounter N18.6 - End stage renal disease Plan Wound Cleansing: Wound #1 Right Back: Clean wound with Normal Saline. Skin Barriers/Peri-Wound Care: Wound #1 Right Back: Skin Prep TIMBERLEE, CRICK ([id number]) Primary Wound Dressing: Wound #1 Right Back: Other: - siltec sorbact......... if dressing needs to be changed at SNF please use the RTD and  boardered foam dressing Dressing Change Frequency: Wound #1 Right Back: Other: - twice a week if saturated if dressing needs to be changed please use the RTD and boardered foam dressing Follow-up Appointments: Wound #1 Right Back: Return Appointment in 1 week. Today we have used a Art gallery manager on her back and asked her to keep it intact for the entire week if possible. Electronic Signature(s) Signed: 07/24/2015 5:04:18 PM By: [name], [name] Previous Signature: 07/24/2015 5:04:07 PM Version By: [name], [name]  Previous Signature: 07/24/2015 2:21:01 PM Version By: [name], [name] Entered By: [name] on 07/24/2015 17:04:18 Bussie, [name] ([id number]) -------------------------------------------------------------------------------- SuperBill Details Patient Name: [name] Date of Service: 07/24/2015 Medical Record Patient Account Number: [id number] Number: [name], RN, [name], Treating RN: 1955/09/16 (60 y.o. [name] Date of Birth/Sex: Female) Other Clinician: Primary Care Physician: [name] Treating Ott Zimmerle Referring Physician: [name] Physician/Extender: [name]: 30 Diagnosis Coding ICD-10 Codes Code Description E11.622 Type 2 diabetes mellitus with other skin ulcer L89.112 Pressure ulcer of right upper back, stage 2 S80.821A Blister (nonthermal), right lower leg, initial encounter N18.6 End stage renal disease Physician Procedures CPT4 Code: [id number] Description: [id number] - WC PHYS LEVEL 3 - EST PT ICD-10 Description Diagnosis E11.622 Type 2 diabetes mellitus with other skin ulcer L89.112 Pressure ulcer of right upper back, stage 2 S80.821A Blister (nonthermal), right lower leg, initial enc N18.6 End  stage renal disease Modifier: ounter Quantity: 1 Electronic Signature(s) Signed: 07/24/2015 2:21:15 PM By: [name], [name] Entered By: [name] on 07/24/2015 14:21:15

## 2015-07-25 NOTE — Progress Notes (Signed)
[name] ([id number]) Visit Report for 07/24/2015 Arrival Information Details [name], [name]. Date of Service: 07/24/2015 2:15 PM Medical Record Number: [id number] Patient Account Number: [id number] Date of Birth/Sex: 10-07-1955 (60 y.o. Female) Treating RN: [name] Primary Care Physician: [name] Other Clinician: Referring Physician: Nicky [name] Treating Physician/Extender: [name] in Treatment: 17 Visit Information History Since Last Visit Any new allergies or adverse reactions: No Patient Arrived: [name] Had a fall or experienced change in No Arrival Time: 14:05 activities of daily living that may affect Accompanied By: SELF risk of falls: Transfer Assistance: None Signs or symptoms of abuse/neglect since last No Patient Identification Verified: Yes visito Secondary Verification Process Yes Has Dressing in Place as Prescribed: Yes Completed: Pain Present Now: No Patient Requires Transmission- No Based Precautions: Patient Has Alerts: Yes Patient Alerts: Patient on Blood Thinner ABI R: 1.28 Electronic Signature(s) Signed: 07/24/2015 4:57:48 PM By: [name] [name], RN Entered By: [name] on 07/24/2015 14:05:46 [name] ([id number]) -------------------------------------------------------------------------------- Clinic Level of Care Assessment Details [name]. Date of Service: 07/24/2015 2:15 PM Medical Record Number: [id number] Patient Account Number: [id number] Date of Birth/Sex: Mar 14, 1956 (60 y.o. Female) Treating RN: [name] Primary Care Physician: [name] Other Clinician: Referring Physician: Nicky [name] Treating Physician/Extender: [name] in Treatment: 30 Clinic Level of Care Assessment Items TOOL 4 Quantity Score []  - Use when only an EandM is performed on FOLLOW-UP visit 0 ASSESSMENTS - Nursing Assessment / Reassessment X - Reassessment of  Co-morbidities (includes updates in patient status) 1 10 X - Reassessment of Adherence to Treatment Plan 1 5 ASSESSMENTS - Wound and Skin Assessment / Reassessment X - Simple Wound Assessment / Reassessment - one wound 1 5 []  - Complex Wound Assessment / Reassessment - multiple wounds 0 []  - Dermatologic / Skin Assessment (not related to wound area) 0 ASSESSMENTS - Focused Assessment []  - Circumferential Edema Measurements - multi extremities 0 []  - Nutritional Assessment / Counseling / Intervention 0 []  - Lower Extremity Assessment (monofilament, tuning fork, pulses) 0 []  - Peripheral Arterial Disease Assessment (using hand held doppler) 0 ASSESSMENTS - Ostomy and/or Continence Assessment and Care []  - Incontinence Assessment and Management 0 []  - Ostomy Care Assessment and Management (repouching, etc.) 0 PROCESS - Coordination of Care X - Simple Patient / Family Education for ongoing care 1 15 []  - Complex (extensive) Patient / Family Education for ongoing care 0 []  - Staff obtains Programmer, systems, Records, Test Results / Process Orders 0 []  - Staff telephones HHA, Nursing Homes / Clarify orders / etc 0 []  - Routine Transfer to another Facility (non-emergent condition) 0 [name], [name] ([id number]) []  - Routine Hospital Admission (non-emergent condition) 0 []  - New Admissions / Biomedical engineer / Ordering NPWT, Apligraf, etc. 0 []  - Emergency Hospital Admission (emergent condition) 0 []  - Simple Discharge Coordination 0 []  - Complex (extensive) Discharge Coordination 0 PROCESS - Special Needs []  - Pediatric / Minor Patient Management 0 []  - Isolation Patient Management 0 []  - Hearing / Language / Visual special needs 0 []  - Assessment of Community assistance (transportation, D/C planning, etc.) 0 []  - Additional assistance / Altered mentation 0 []  - Support Surface(s) Assessment (bed, cushion, seat, etc.) 0 INTERVENTIONS - Wound Cleansing / Measurement X - Simple Wound Cleansing  - one wound 1 5 []  - Complex Wound Cleansing - multiple wounds 0 X - Wound Imaging (photographs - any number of wounds) 1  5 []  - Wound Tracing (instead of photographs) 0 X - Simple Wound Measurement - one wound 1 5 []  - Complex Wound Measurement - multiple wounds 0 INTERVENTIONS - Wound Dressings []  - Small Wound Dressing one or multiple wounds 0 []  - Medium Wound Dressing one or multiple wounds 0 []  - Large Wound Dressing one or multiple wounds 0 []  - Application of Medications - topical 0 []  - Application of Medications - injection 0 INTERVENTIONS - Miscellaneous []  - External ear exam 0 [name], [name] ([id number]) []  - Specimen Collection (cultures, biopsies, blood, body fluids, etc.) 0 []  - Specimen(s) / Culture(s) sent or taken to Lab for analysis 0 []  - Patient Transfer (multiple staff / [name] Lift / Similar devices) 0 []  - Simple Staple / Suture removal (25 or less) 0 []  - Complex Staple / Suture removal (26 or more) 0 []  - Hypo / Hyperglycemic Management (close monitor of Blood Glucose) 0 []  - Ankle / Brachial Index (ABI) - do not check if billed separately 0 X - Vital Signs 1 5 Has the patient been seen at the hospital within the last three years: Yes Total Score: 55 Level Of Care: New/Established - Level 2 Electronic Signature(s) Signed: 07/24/2015 4:57:48 PM By: [name] [name], RN Entered By: [name] on 07/24/2015 14:22:05 [name], [name] ([id number]) -------------------------------------------------------------------------------- Encounter Discharge Information Details [name], [name] Date of Service: 07/24/2015 2:15 PM Medical Record Number: [id number] Patient Account Number: [id number] Date of Birth/Sex: November 12, 1955 (59 y.o. Female) Treating RN: [name] Primary Care Physician: [name] Other Clinician: Referring Physician: Nicky [name] Treating Physician/Extender: [name] in Treatment: 30 Encounter Discharge Information  Items Discharge Pain Level: 0 Discharge Condition: Stable Ambulatory Status: Walker Discharge Destination: Home Private Transportation: Auto Accompanied By: self Schedule Follow-up Appointment: No Medication Reconciliation completed and No provided to Patient/Care Hershy Flenner: Clinical Summary of Care: Electronic Signature(s) Signed: 07/24/2015 4:57:48 PM By: [name] [name], RN Entered By: [name] on 07/24/2015 14:23:14 [name], [name] ([id number]) -------------------------------------------------------------------------------- Multi Wound Chart Details [name]. Date of Service: 07/24/2015 2:15 PM Medical Record Number: [id number] Patient Account Number: [id number] Date of Birth/Sex: 02-12-1956 (60 y.o. Female) Treating RN: [name] Primary Care Physician: [name] Other Clinician: Referring Physician: Nicky [name] Treating Physician/Extender: [name] in Treatment: 30 Vital Signs Height(in): 65 Pulse(bpm): 91 Weight(lbs): 156 Blood Pressure 175/87 (mmHg): Body Mass Index(BMI): 26 Temperature(F): 98.1 Respiratory Rate 18 (breaths/min): Photos: [1:No Photos] [N/A:N/A] Wound Location: [1:Right Back] [N/A:N/A] Wounding Event: [1:Blister] [N/A:N/A] Primary Etiology: [1:Pressure Ulcer] [N/A:N/A] Comorbid History: [1:Glaucoma, Anemia, Coronary Artery Disease, Hypertension, Type II Diabetes, End Stage Renal Disease, Osteoarthritis, Neuropathy] [N/A:N/A] Date Acquired: [1:10/19/2013] [N/A:N/A] Weeks of Treatment: [1:30] [N/A:N/A] Wound Status: [1:Open] [N/A:N/A] Measurements L x W x D 1.3x1x0.1 [N/A:N/A] (cm) Area (cm) : [1:1.021] [N/A:N/A] Volume (cm) : [1:0.102] [N/A:N/A] % Reduction in Area: [1:97.30%] [N/A:N/A] % Reduction in Volume: 97.30% [N/A:N/A] Classification: [1:Category/Stage II] [N/A:N/A] Exudate Amount: [1:Large] [N/A:N/A] Exudate Type: [1:Sanguinous] [N/A:N/A] Exudate Color: [1:red] [N/A:N/A] Wound  Margin: [1:Indistinct, nonvisible] [N/A:N/A] Granulation Amount: [1:Large (67-100%)] [N/A:N/A] Granulation Quality: [1:Red] [N/A:N/A] Necrotic Amount: [1:None Present (0%)] [N/A:N/A] Exposed Structures: [1:Fascia: No Fat: No Tendon: No] [N/A:N/A] Muscle: No Joint: No Bone: No Limited to Skin Breakdown Epithelialization: Medium (34-66%) N/A N/A Periwound Skin Texture: Edema: No N/A N/A Excoriation: No Induration: No Callus: No Crepitus: No Fluctuance: No Friable: No Rash: No Scarring: No Periwound Skin Moist: Yes N/A N/A Moisture: Maceration: No Dry/Scaly:  No Periwound Skin Color: Atrophie Blanche: No N/A N/A Cyanosis: No Ecchymosis: No Erythema: No Hemosiderin Staining: No Mottled: No Pallor: No Rubor: No Temperature: No Abnormality N/A N/A Tenderness on Yes N/A N/A Palpation: Wound Preparation: Ulcer Cleansing: N/A N/A Rinsed/Irrigated with Saline Topical Anesthetic Applied: None Treatment Notes Electronic Signature(s) Signed: 07/24/2015 4:57:48 PM By: [name] [name], RN Entered By: [name] on 07/24/2015 14:20:19 [place], [name] ([id number]) -------------------------------------------------------------------------------- Multi-Disciplinary Care Plan Details [name]. Date of Service: 07/24/2015 2:15 PM Medical Record Number: [id number] Patient Account Number: [id number] Date of Birth/Sex: 03/18/1956 (60 y.o. Female) Treating RN: [name] Primary Care Physician: [name] Other Clinician: Referring Physician: Nicky [name] Treating Physician/Extender: [name] in Treatment: 30 Active Inactive Abuse / Safety / Falls / Self Care Management Nursing Diagnoses: Potential for falls Goals: Patient will remain injury free Date Initiated: 12/23/2014 Goal Status: Active Patient/caregiver will verbalize understanding of skin care regimen Date Initiated: 12/23/2014 Goal Status: Active Patient/caregiver will  verbalize/demonstrate measures taken to prevent injury and/or falls Date Initiated: 12/23/2014 Goal Status: Active Patient/caregiver will verbalize/demonstrate understanding of what to do in case of emergency Date Initiated: 12/23/2014 Goal Status: Active Interventions: Assess fall risk on admission and as needed Assess: immobility, friction, shearing, incontinence upon admission and as needed Assess impairment of mobility on admission and as needed per policy Assess self care needs on admission and as needed Provide education on fall prevention Notes: Wound/Skin Impairment Nursing Diagnoses: Impaired tissue integrity Knowledge deficit related to ulceration/compromised skin integrity Goals: Patient/caregiver will verbalize understanding of skin care regimen [name], [name] ([id number]) Date Initiated: 12/23/2014 Goal Status: Active Ulcer/skin breakdown will heal within 14 weeks Date Initiated: 12/23/2014 Goal Status: Active Interventions: Assess patient/caregiver ability to obtain necessary supplies Assess patient/caregiver ability to perform ulcer/skin care regimen upon admission and as needed Assess ulceration(s) every visit Provide education on ulcer and skin care Treatment Activities: Skin care regimen initiated : 07/24/2015 Topical wound management initiated : 07/24/2015 Notes: Electronic Signature(s) Signed: 07/24/2015 4:57:48 PM By: [name] [name], RN Entered By: [name] on 07/24/2015 14:20:05 [name], [name] ([id number]) -------------------------------------------------------------------------------- Pain Assessment Details [name]. Date of Service: 07/24/2015 2:15 PM Medical Record Number: [id number] Patient Account Number: [id number] Date of Birth/Sex: November 10, 1955 (60 y.o. Female) Treating RN: [name] Primary Care Physician: [name] Other Clinician: Referring Physician: Nicky [name] Treating Physician/Extender: [name] in  Treatment: 30 Active Problems Location of Pain Severity and Description of Pain Patient Has Paino No Site Locations Pain Management and Medication Current Pain Management: Electronic Signature(s) Signed: 07/24/2015 4:57:48 PM By: [name] [name], RN Entered By: [name] on 07/24/2015 14:05:51 [name], [name] ([id number]) -------------------------------------------------------------------------------- Patient/Caregiver Education Details [name]. Date of Service: 07/24/2015 2:15 PM Medical Record Number: [id number] Patient Account Number: [id number] Date of Birth/Gender: January 10, 1956 (60 y.o. Female) Treating RN: [name] Primary Care Physician: [name] Other Clinician: Referring Physician: Nicky [name] Treating Physician/Extender: [name] in Treatment: 30 Education Assessment Education Provided To: Patient Education Topics Provided Safety: Methods: Explain/Verbal Responses: State content correctly Wound/Skin Impairment: Methods: Explain/Verbal Responses: State content correctly Electronic Signature(s) Signed: 07/24/2015 4:57:48 PM By: [name] [name], RN Entered By: [name] on 07/24/2015 14:23:28 [name], [name] ([id number]) -------------------------------------------------------------------------------- Wound Assessment Details [name], [name] Date of Service: 07/24/2015 2:15 PM Medical Record Number: [id number] Patient Account Number: [id number] Date of Birth/Sex: 11/29/1955 (60 y.o. Female) Treating RN: [name],  RN, [name], Velva [name] Primary Care Physician: [name] Other Clinician: Referring Physician: Nicky [name] Treating Physician/Extender: [name] in Treatment: 30 Wound Status Wound Number: 1 Primary Pressure Ulcer Etiology: Wound Location: Right Back Wound Open Wounding Event: Blister Status: Date Acquired: 10/19/2013 Comorbid Glaucoma, Anemia, Coronary Artery Weeks Of Treatment:  30 History: Disease, Hypertension, Type II Clustered Wound: No Diabetes, End Stage Renal Disease, Osteoarthritis, Neuropathy Photos Photo Uploaded By: [name] on 07/24/2015 14:34:12 Wound Measurements Length: (cm) 1.3 Width: (cm) 1 Depth: (cm) 0.1 Area: (cm) 1.021 Volume: (cm) 0.102 % Reduction in Area: 97.3% % Reduction in Volume: 97.3% Epithelialization: Medium (34-66%) Tunneling: No Undermining: No Wound Description Classification: Category/Stage II Wound Margin: Indistinct, nonvisible Exudate Amount: Large Exudate Type: Sanguinous Exudate Color: red Foul Odor After Cleansing: No Wound Bed Granulation Amount: Large (67-100%) Exposed Structure Granulation Quality: Red Fascia Exposed: No Necrotic Amount: None Present (0%) Fat Layer Exposed: No Mendia, Fronnie S. ([id number]) Tendon Exposed: No Muscle Exposed: No Joint Exposed: No Bone Exposed: No Limited to Skin Breakdown Periwound Skin Texture Texture Color No Abnormalities Noted: No No Abnormalities Noted: No Callus: No Atrophie Blanche: No Crepitus: No Cyanosis: No Excoriation: No Ecchymosis: No Fluctuance: No Erythema: No Friable: No Hemosiderin Staining: No Induration: No Mottled: No Localized Edema: No Pallor: No Rash: No Rubor: No Scarring: No Temperature / Pain Moisture Temperature: No Abnormality No Abnormalities Noted: No Tenderness on Palpation: Yes Dry / Scaly: No Maceration: No Moist: Yes Wound Preparation Ulcer Cleansing: Rinsed/Irrigated with Saline Topical Anesthetic Applied: None Treatment Notes Wound #1 (Right Back) 1. Cleansed with: Clean wound with Normal Saline 3. Peri-wound Care: Skin Prep 4. Dressing Applied: Other dressing (specify in notes) Notes siltec sorbact Electronic Signature(s) Signed: 07/24/2015 4:57:48 PM By: [name] [name], RN Entered By: [name] on 07/24/2015 14:13:33 [place], [name]  ([id number]) -------------------------------------------------------------------------------- Vitals Details [name]. Date of Service: 07/24/2015 2:15 PM Medical Record Number: [id number] Patient Account Number: [id number] Date of Birth/Sex: 03/14/1956 (60 y.o. Female) Treating RN: [name] Primary Care Physician: [name] Other Clinician: Referring Physician: Nicky [name] Treating Physician/Extender: [name] in Treatment: 30 Vital Signs Time Taken: 14:06 Temperature (F): 98.1 Height (in): 65 Pulse (bpm): 91 Weight (lbs): 156 Respiratory Rate (breaths/min): 18 Body Mass Index (BMI): 26 Blood Pressure (mmHg): 175/87 Reference Range: 80 - 120 mg / dl Electronic Signature(s) Signed: 07/24/2015 4:57:48 PM By: [name] [name], RN Entered By: [name] on 07/24/2015 14:08:03

## 2015-07-31 ENCOUNTER — Encounter: Payer: Medicare Other | Admitting: Surgery

## 2015-07-31 DIAGNOSIS — E11622 Type 2 diabetes mellitus with other skin ulcer: Secondary | ICD-10-CM | POA: Diagnosis not present

## 2015-08-01 NOTE — Progress Notes (Signed)
[name], [name] ([id number]) Visit Report for 07/31/2015 Chief Complaint Document Details [name], [name]. Date of Service: 07/31/2015 8:00 AM Medical Record Number: [id number] Patient Account Number: [id number] Date of Birth/Sex: November 13, 1955 (60 y.o. Female) Treating RN: Carolyne Fiscal, Debi Primary Care Physician: [name] Other Clinician: Referring Physician: [name] Treating Physician/Extender: Frann Rider in Treatment: 31 Information Obtained from: Patient Chief Complaint Patient presents to the wound care center for a consult due non healing wound. She has an open wound on her right upper back which she'[name] had for about a year and she recently noticed a blister on her right lower extremity about 2 weeks ago. Electronic Signature([name]) Signed: 07/31/2015 8:44:05 AM By: [name], [name] Entered By: [name] on 07/31/2015 08:44:05 Feldpausch, [name] ([id number]) -------------------------------------------------------------------------------- HPI Details [name], [name]. Date of Service: 07/31/2015 8:00 AM Medical Record Number: [id number] Patient Account Number: [id number] Date of Birth/Sex: Nov 21, 1955 (60 y.o. Female) Treating RN: Carolyne Fiscal, Debi Primary Care Physician: [name] Other Clinician: Referring Physician: [name] Treating Physician/Extender: Frann Rider in Treatment: 31 History of Present Illness Location: right upper back and right lower extremity wounds Quality: Patient reports No Pain. Severity: Patient states wound ([name]) are getting better. Duration: Patient has had the wound for > 3 months prior to seeking treatment at the wound center Timing: she thought it first occurred when she was using a heating pad about a year ago after back surgery. Context: The wound appeared gradually over time Modifying Factors: Patient is currently on renal dialysis and receives treatments 3 times weekly Associated Signs and  Symptoms: Patient reports having: surgery scheduled for this week for a AV fistula left arm. HPI Description: 60 year old patient who is known to be a diabetic and has end-stage renal disease has had several comorbidities including coronary artery disease, hypertension, hyperlipidemia, pancreatitis, anemia, previous history of hysterectomy, cholecystectomy, left-sided salivary gland excision, bilateral cataract surgery,Peritoneal dialysis catheter, hemodialysis catheter. the area on the back has also been caused by instant pressure she used to sleep on a recliner all day and has significant kyphoscoliosis. As far as the wound on her right lower extremity she'[name] not sure how this blister occurred but she thought it has been there for about 2 weeks. No recent blood investigations available and no recent hemoglobin A1c. 12/30/2014 -- she is an assisted living facility but I believe the nurses that have not followed instructions as she had some cream applied on her back and there was a different dressing. Last week she'[name] had a AV fistula placed on her left forearm. 01/13/2015 -- she has had some localized infection at the port site and she'[name] been on doxycycline for this. 02/05/2015 - he has developed a small blister on her right lower extremity. 02/10/2015 -- she has developed another small blister on her right anterior chest wall in the area where she'[name] had tape for her dialysis access. this may just be injury caused by a tape burn. 02/18/2015 -- no new blisters and she had a dermatology opinion and they have taken a biopsy of her skin. She also had a left brachial AV fistula placed this week. 03/03/2015 -- though we do not have the pathology report yet the patient says she has been put on prednisone because this skin disease is possibly to do with her immune system and her dermatologist is recommended this. 03/10/2015 -- she has developed a new blister which is quite large on her right lower  extremity on the shin.  05/22/2015 -- she did very well with her dressing but on trying to remove the [place] it peels away the newly healed skin. [name], [name] ([id number]) 06/05/2015 -- the patient was in the ER this past week for severe bleeding from the right nostril and had to have ENT see her and do a packing. They've also been holding her heparin during her dialysis. The patient is going back to ENT today. Other than that she has had no other significant issues. 06/19/2015 -- she is back on her blood thinners and continues to have her hemodialysis as before. 07/31/2015 -- a large blister has popped up again on her right lower extremity in the same place in the mid shin area in the anterior lateral compartment. Electronic Signature([name]) Signed: 07/31/2015 8:46:28 AM By: [name], [name] Entered By: [name] on 07/31/2015 08:46:28 Apostol, [name] ([id number]) -------------------------------------------------------------------------------- Physical Exam Details [name], [name]. Date of Service: 07/31/2015 8:00 AM Medical Record Number: [id number] Patient Account Number: [id number] Date of Birth/Sex: [birth year] (60 y.o. Female) Treating RN: Carolyne Fiscal, Debi Primary Care Physician: [name] Other Clinician: Referring Physician: [name] Treating Physician/Extender: Frann Rider in Treatment: 31 Constitutional . Pulse regular. Respirations normal and unlabored. Afebrile. . Eyes Nonicteric. Reactive to light. Ears, Nose, Mouth, and Throat Lips, teeth, and gums WNL.Marland Kitchen Moist mucosa without lesions. Neck supple and nontender. No palpable supraclavicular or cervical adenopathy. Normal sized without goiter. Respiratory WNL. No retractions.. Cardiovascular Pedal Pulses WNL. No clubbing, cyanosis or edema. Chest Breasts symmetical and no nipple discharge.. Breast tissue WNL, no masses, lumps, or tenderness.. Lymphatic No adneopathy. No  adenopathy. No adenopathy. Musculoskeletal Adexa without tenderness or enlargement.. Digits and nails w/o clubbing, cyanosis, infection, petechiae, ischemia, or inflammatory conditions.. Integumentary (Hair, Skin) No suspicious lesions. No crepitus or fluctuance. No peri-wound warmth or erythema. No masses.Marland Kitchen Psychiatric Judgement and insight Intact.. No evidence of depression, anxiety, or agitation.. Notes the back continues to look extremely good and there is no debridement required. There is a small area open. He has a large blister on the right lower extremity which has serous fluid and some of it was gently removed. Electronic Signature([name]) Signed: 07/31/2015 8:48:28 AM By: [name], [name] Entered By: [name] on 07/31/2015 08:48:28 [name], [name] ([id number]) -------------------------------------------------------------------------------- Physician Orders Details [name]. Date of Service: 07/31/2015 8:00 AM Medical Record Number: [id number] Patient Account Number: [id number] Date of Birth/Sex: 04/22/1956 (59 y.o. Female) Treating RN: Carolyne Fiscal, Debi Primary Care Physician: [name] Other Clinician: Referring Physician: [name] Treating Physician/Extender: Frann Rider in Treatment: 30 Verbal / Phone Orders: Yes Clinician: Carolyne Fiscal, Debi Read Back and Verified: Yes Diagnosis Coding Wound Cleansing Wound #1 Right Back o Clean wound with Normal Saline. Wound #8 Right,Midline Lower Leg o Clean wound with Normal Saline. Anesthetic Wound #1 Right Back o Topical Lidocaine 4% cream applied to wound bed prior to debridement Wound #8 Right,Midline Lower Leg o Topical Lidocaine 4% cream applied to wound bed prior to debridement Skin Barriers/Peri-Wound Care Wound #1 Right Back o Skin Prep Wound #8 Right,Midline Lower Leg o Skin Prep Primary Wound Dressing Wound #1 Right Back o Other: - siltec sorbact Wound #8  Right,Midline Lower Leg o Mepitel One o Dry Gauze - conform Dressing Change Frequency Wound #1 Right Back o Change dressing every week - unless saturated Wound #8 Right,Midline Lower Leg o Other: - only change if it needs to be [name], [name]. ([id number]) Follow-up Appointments Wound #1  Right Back o Return Appointment in 1 week. Electronic Signature([name]) Signed: 07/31/2015 3:13:05 PM By: [name], [name] Signed: 07/31/2015 4:44:47 PM By: Alric Quan Entered By: Alric Quan on 07/31/2015 08:36:01 [name], [name] ([id number]) -------------------------------------------------------------------------------- Problem List Details Patient Name: NICHELE, FORNI. Date of Service: 07/31/2015 8:00 AM Medical Record Number: [id number] Patient Account Number: [id number] Date of Birth/Sex: 1956-06-21 (59 y.o. Female) Treating RN: Ahmed Prima Primary Care Physician: [name] Other Clinician: Referring Physician: [name] Treating Physician/Extender: Frann Rider in Treatment: 31 Active Problems ICD-10 Encounter Code Description Active Date Diagnosis E11.622 Type 2 diabetes mellitus with other skin ulcer 12/23/2014 Yes L89.112 Pressure ulcer of right upper back, stage 2 12/23/2014 Yes S80.821A Blister (nonthermal), right lower leg, initial encounter 12/23/2014 Yes N18.6 End stage renal disease 12/23/2014 Yes Inactive Problems Resolved Problems Electronic Signature([name]) Signed: 07/31/2015 8:43:58 AM By: [name], [name] Entered By: [name] on 07/31/2015 08:43:58 [name], [name] ([id number]) -------------------------------------------------------------------------------- Progress Note Details [name], [name]. Date of Service: 07/31/2015 8:00 AM Medical Record Number: [id number] Patient Account Number: [id number] Date of Birth/Sex: 28-Jul-1955 (59 y.o. Female) Treating RN: Carolyne Fiscal, Debi Primary Care Physician: [name] Other  Clinician: Referring Physician: [name] Treating Physician/Extender: Frann Rider in Treatment: 31 Subjective Chief Complaint Information obtained from Patient Patient presents to the wound care center for a consult due non healing wound. She has an open wound on her right upper back which she'[name] had for about a year and she recently noticed a blister on her right lower extremity about 2 weeks ago. History of Present Illness (HPI) The following HPI elements were documented for the patient'[name] wound: Location: right upper back and right lower extremity wounds Quality: Patient reports No Pain. Severity: Patient states wound ([name]) are getting better. Duration: Patient has had the wound for > 3 months prior to seeking treatment at the wound center Timing: she thought it first occurred when she was using a heating pad about a year ago after back surgery. Context: The wound appeared gradually over time Modifying Factors: Patient is currently on renal dialysis and receives treatments 3 times weekly Associated Signs and Symptoms: Patient reports having: surgery scheduled for this week for a AV fistula left arm. 60 year old patient who is known to be a diabetic and has end-stage renal disease has had several comorbidities including coronary artery disease, hypertension, hyperlipidemia, pancreatitis, anemia, previous history of hysterectomy, cholecystectomy, left-sided salivary gland excision, bilateral cataract surgery,Peritoneal dialysis catheter, hemodialysis catheter. the area on the back has also been caused by instant pressure she used to sleep on a recliner all day and has significant kyphoscoliosis. As far as the wound on her right lower extremity she'[name] not sure how this blister occurred but she thought it has been there for about 2 weeks. No recent blood investigations available and no recent hemoglobin A1c. 12/30/2014 -- she is an assisted living facility but I believe the  nurses that have not followed instructions as she had some cream applied on her back and there was a different dressing. Last week she'[name] had a AV fistula placed on her left forearm. 01/13/2015 -- she has had some localized infection at the port site and she'[name] been on doxycycline for this. 02/05/2015 - he has developed a small blister on her right lower extremity. 02/10/2015 -- she has developed another small blister on her right anterior chest wall in the area where she'[name] had tape for her dialysis access. this may just be injury caused by  a tape burn. 02/18/2015 -- no new blisters and she had a dermatology opinion and they have taken a biopsy of her skin. [name], [name] ([id number]) She also had a left brachial AV fistula placed this week. 03/03/2015 -- though we do not have the pathology report yet the patient says she has been put on prednisone because this skin disease is possibly to do with her immune system and her dermatologist is recommended this. 03/10/2015 -- she has developed a new blister which is quite large on her right lower extremity on the shin. 05/22/2015 -- she did very well with her dressing but on trying to remove the [place] it peels away the newly healed skin. 06/05/2015 -- the patient was in the ER this past week for severe bleeding from the right nostril and had to have ENT see her and do a packing. They've also been holding her heparin during her dialysis. The patient is going back to ENT today. Other than that she has had no other significant issues. 06/19/2015 -- she is back on her blood thinners and continues to have her hemodialysis as before. 07/31/2015 -- a large blister has popped up again on her right lower extremity in the same place in the mid shin area in the anterior lateral compartment. Objective Constitutional Pulse regular. Respirations normal and unlabored. Afebrile. Vitals Time Taken: 8:09 AM, Height: 65 in, Weight: 156 lbs, BMI: 26,  Temperature: 97.9 F, Pulse: 78 bpm, Respiratory Rate: 18 breaths/min, Blood Pressure: 209/79 mmHg. Eyes Nonicteric. Reactive to light. Ears, Nose, Mouth, and Throat Lips, teeth, and gums WNL.Marland Kitchen Moist mucosa without lesions. Neck supple and nontender. No palpable supraclavicular or cervical adenopathy. Normal sized without goiter. Respiratory WNL. No retractions.. Cardiovascular Pedal Pulses WNL. No clubbing, cyanosis or edema. Chest Breasts symmetical and no nipple discharge.. Breast tissue WNL, no masses, lumps, or tenderness.. Lymphatic Pasquarelli, Brayah [name]. ([id number]) No adneopathy. No adenopathy. No adenopathy. Musculoskeletal Adexa without tenderness or enlargement.. Digits and nails w/o clubbing, cyanosis, infection, petechiae, ischemia, or inflammatory conditions.Marland Kitchen Psychiatric Judgement and insight Intact.. No evidence of depression, anxiety, or agitation.. General Notes: the back continues to look extremely good and there is no debridement required. There is a small area open. He has a large blister on the right lower extremity which has serous fluid and some of it was gently removed. Integumentary (Hair, Skin) No suspicious lesions. No crepitus or fluctuance. No peri-wound warmth or erythema. No masses.. Wound #1 status is Open. Original cause of wound was Blister. The wound is located on the Right Back. The wound measures 2.5cm length x 2.3cm width x 0.1cm depth; 4.516cm^2 area and 0.452cm^3 volume. The wound is limited to skin breakdown. There is no tunneling or undermining noted. There is a medium amount of sanguinous drainage noted. The wound margin is indistinct and nonvisible. There is large (67- 100%) red granulation within the wound bed. There is no necrotic tissue within the wound bed. The periwound skin appearance exhibited: Moist. The periwound skin appearance did not exhibit: Callus, Crepitus, Excoriation, Fluctuance, Friable, Induration, Localized Edema, Rash,  Scarring, Dry/Scaly, Maceration, Atrophie Blanche, Cyanosis, Ecchymosis, Hemosiderin Staining, Mottled, Pallor, Rubor, Erythema. Periwound temperature was noted as No Abnormality. The periwound has tenderness on palpation. Wound #8 status is Open. Original cause of wound was Gradually Appeared. The wound is located on the Right,Midline Lower Leg. The wound measures 4cm length x 6cm width x 0.1cm depth; 18.85cm^2 area and 1.885cm^3 volume. The wound is limited to skin breakdown. There is no  tunneling or undermining noted. There is a medium amount of serous drainage noted. The wound margin is flat and intact. There is no granulation within the wound bed. There is a large (67-100%) amount of necrotic tissue within the wound bed including Eschar. The periwound skin appearance exhibited: Localized Edema, Moist. Assessment Active Problems ICD-10 E11.622 - Type 2 diabetes mellitus with other skin ulcer L89.112 - Pressure ulcer of right upper back, stage 2 S80.821A - Blister (nonthermal), right lower leg, initial encounter N18.6 - End stage renal disease [name], [name]. 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 She still under the care of a dermatologist and continues prednisone. We have been doing complex care for her for a while and continued to support her to the best. Her problems are recurring and she does understand that no cure is possible via wound care only. I have recommended Siltec Sorbact for her back and Mepitel one with a gauze padding and Kerlix for her leg to be changed appropriately. Plan Wound Cleansing: Wound #1 Right Back: Clean wound with Normal Saline. Wound #8 Right,Midline Lower Leg: Clean wound with Normal Saline. Anesthetic: Wound #1 Right Back: Topical Lidocaine 4% cream applied to wound bed prior to debridement Wound #8 Right,Midline Lower Leg: Topical Lidocaine 4% cream  applied to wound bed prior to debridement Skin Barriers/Peri-Wound Care: Wound #1 Right Back: Skin Prep Wound #8 Right,Midline Lower Leg: Skin Prep Primary Wound Dressing: Wound #1 Right Back: Other: - siltec sorbact Wound #8 Right,Midline Lower Leg: Mepitel One Dry Gauze - conform Dressing Change Frequency: Wound #1 Right Back: Change dressing every week - unless saturated Wound #8 Right,Midline Lower Leg: Other: - only change if it needs to be Follow-up Appointments: Wound #1 Right Back: Return Appointment in 1 week. 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 She still under the care of a dermatologist and continues prednisone. We have been doing complex care for her for a while and continued to support her to the best. Her problems are recurring and she does understand that no cure is possible via wound care only. I have recommended Siltec Sorbact for her back and Mepitel one with a gauze padding and Kerlix for her leg to be changed appropriately. Electronic Signature([name]) Signed: 07/31/2015 8:50:52 AM By: [name], [name] Entered By: [name] on 07/31/2015 08:50:51 Seney, [name] ([id number]) -------------------------------------------------------------------------------- SuperBill Details [name]. Date of Service: 07/31/2015 Medical Record Number: [id number] Patient Account Number: [id number] Date of Birth/Sex: [birth year] (60 y.o. Female) Treating RN: Carolyne Fiscal, Debi Primary Care Physician: [name] Other Clinician: Referring Physician: [name] Treating Physician/Extender: Frann Rider in Treatment: 31 Diagnosis Coding ICD-10 Codes Code Description E11.622 Type 2 diabetes mellitus with other skin ulcer L89.112 Pressure ulcer of right upper back, stage 2 S80.821A Blister (nonthermal), right lower leg, initial  encounter N18.6 End stage renal disease Facility Procedures CPT4 Code: [id number] Description: 99213 - WOUND CARE VISIT-LEV 3 EST PT Modifier: Quantity: 1 Physician Procedures CPT4 Code: [id number] Description: [id number] - WC PHYS LEVEL 3 - EST PT ICD-10 Description Diagnosis E11.622 Type 2 diabetes mellitus with other skin ulcer L89.112 Pressure ulcer of right upper back, stage 2 S80.821A Blister (nonthermal), right lower leg, initial enc N18.6 End  stage renal disease Modifier: ounter Quantity: 1 Electronic Signature([name]) Signed: 07/31/2015 4:44:47 PM By: Alric Quan  Previous Signature: 07/31/2015 8:51:13 AM Version By: [name], [name] Entered By: Alric Quan on 07/31/2015 15:24:39

## 2015-08-04 NOTE — Progress Notes (Signed)
SHERRIE, SINGELTON ([id number]) Visit Report for 07/31/2015 Arrival Information Details [name], [name]. Date of Service: 07/31/2015 8:00 AM Medical Record Number: [id number] Patient Account Number: [id number] Date of Birth/Sex: 01-02-1956 (59 y.o. Female) Treating RN: Carolyne Fiscal, Debi Primary Care Physician: [name] Other Clinician: Referring Physician: [name] Treating Physician/Extender: Frann Rider in Treatment: 45 Visit Information History Since Last Visit All ordered tests and consults were completed: No Patient Arrived: Walker Added or deleted any medications: No Arrival Time: 08:05 Any new allergies or adverse reactions: No Accompanied By: self Had a fall or experienced change in No Transfer Assistance: None activities of daily living that may affect Patient Identification Verified: Yes risk of falls: Secondary Verification Process Yes Signs or symptoms of abuse/neglect since last No Completed: visito Patient Requires Transmission- No Hospitalized since last visit: No Based Precautions: Pain Present Now: No Patient Has Alerts: Yes Patient Alerts: Patient on Blood Thinner ABI R: 1.28 Electronic Signature(s) Signed: 07/31/2015 4:44:47 PM By: Alric Quan Entered By: Alric Quan on 07/31/2015 08:09:23 Hargadon, [name] Grief ([id number]) -------------------------------------------------------------------------------- Clinic Level of Care Assessment Details [name]. Date of Service: 07/31/2015 8:00 AM Medical Record Number: [id number] Patient Account Number: [id number] Date of Birth/Sex: September 01, 1955 (59 y.o. Female) Treating RN: Carolyne Fiscal, Debi Primary Care Physician: [name] Other Clinician: Referring Physician: [name] Treating Physician/Extender: Frann Rider in Treatment: 31 Clinic Level of Care Assessment Items TOOL 4 Quantity Score []  - Use when only an EandM is performed on FOLLOW-UP visit  0 ASSESSMENTS - Nursing Assessment / Reassessment []  - Reassessment of Co-morbidities (includes updates in patient status) 0 X - Reassessment of Adherence to Treatment Plan 1 5 ASSESSMENTS - Wound and Skin Assessment / Reassessment []  - Simple Wound Assessment / Reassessment - one wound 0 X - Complex Wound Assessment / Reassessment - multiple wounds 2 5 []  - Dermatologic / Skin Assessment (not related to wound area) 0 ASSESSMENTS - Focused Assessment []  - Circumferential Edema Measurements - multi extremities 0 []  - Nutritional Assessment / Counseling / Intervention 0 []  - Lower Extremity Assessment (monofilament, tuning fork, pulses) 0 []  - Peripheral Arterial Disease Assessment (using hand held doppler) 0 ASSESSMENTS - Ostomy and/or Continence Assessment and Care []  - Incontinence Assessment and Management 0 []  - Ostomy Care Assessment and Management (repouching, etc.) 0 PROCESS - Coordination of Care []  - Simple Patient / Family Education for ongoing care 0 X - Complex (extensive) Patient / Family Education for ongoing care 1 20 []  - Staff obtains Programmer, systems, Records, Test Results / Process Orders 0 X - Staff telephones HHA, Nursing Homes / Clarify orders / etc 1 10 []  - Routine Transfer to another Facility (non-emergent condition) 0 Podolsky, Danilynn S. ([id number]) []  - Routine Hospital Admission (non-emergent condition) 0 []  - New Admissions / Biomedical engineer / Ordering NPWT, Apligraf, etc. 0 []  - Emergency Hospital Admission (emergent condition) 0 X - Simple Discharge Coordination 1 10 []  - Complex (extensive) Discharge Coordination 0 PROCESS - Special Needs []  - Pediatric / Minor Patient Management 0 []  - Isolation Patient Management 0 []  - Hearing / Language / Visual special needs 0 []  - Assessment of Community assistance (transportation, D/C planning, etc.) 0 []  - Additional assistance / Altered mentation 0 []  - Support Surface(s) Assessment (bed, cushion, seat, etc.)  0 INTERVENTIONS - Wound Cleansing / Measurement []  - Simple Wound Cleansing - one wound 0 X - Complex Wound Cleansing - multiple wounds 2 5 []  - Wound Imaging (  photographs - any number of wounds) 0 []  - Wound Tracing (instead of photographs) 0 []  - Simple Wound Measurement - one wound 0 X - Complex Wound Measurement - multiple wounds 2 5 INTERVENTIONS - Wound Dressings []  - Small Wound Dressing one or multiple wounds 0 X - Medium Wound Dressing one or multiple wounds 2 15 []  - Large Wound Dressing one or multiple wounds 0 []  - Application of Medications - topical 0 []  - Application of Medications - injection 0 INTERVENTIONS - Miscellaneous []  - External ear exam 0 [name], [name] ([id number]) []  - Specimen Collection (cultures, biopsies, blood, body fluids, etc.) 0 []  - Specimen(s) / Culture(s) sent or taken to Lab for analysis 0 []  - Patient Transfer (multiple staff / Harrel Lemon Lift / Similar devices) 0 []  - Simple Staple / Suture removal (25 or less) 0 []  - Complex Staple / Suture removal (26 or more) 0 []  - Hypo / Hyperglycemic Management (close monitor of Blood Glucose) 0 []  - Ankle / Brachial Index (ABI) - do not check if billed separately 0 X - Vital Signs 1 5 Has the patient been seen at the hospital within the last three years: Yes Total Score: 110 Level Of Care: New/Established - Level 3 Electronic Signature(s) Signed: 07/31/2015 4:44:47 PM By: Alric Quan Entered By: Alric Quan on 07/31/2015 15:24:29 [name], [name] ([id number]) -------------------------------------------------------------------------------- Complex / Palliative Patient Assessment Details [name], [name] Date of Service: 07/31/2015 8:00 AM Medical Record Number: [id number] Patient Account Number: [id number] Date of Birth/Sex: 04-08-1956 (59 y.o. Female) Treating RN: Carolyne Fiscal, Debi Primary Care Physician: [name] Other Clinician: Referring Physician: [name] Treating  Physician/Extender: Frann Rider in Treatment: 31 Palliative Management Criteria Complex Wound Management Criteria Patient has remarkable or complex co-morbidities requiring medications or treatments that extend wound healing times. Examples: o Diabetes mellitus with chronic renal failure or end stage renal disease requiring dialysis o Advanced or poorly controlled rheumatoid arthritis o Diabetes mellitus and end stage chronic obstructive pulmonary disease o Active cancer with current chemo- or radiation therapy autoimmune disease of the skin Care Approach Wound Care Plan: Complex Wound Management Electronic Signature(s) Signed: 07/31/2015 4:28:01 PM By: Alric Quan Signed: 08/03/2015 4:49:11 PM By: [name], [name] Entered By: Alric Quan on 07/31/2015 16:28:01 [place]rum, [name] Grief ([id number]) -------------------------------------------------------------------------------- Encounter Discharge Information Details [name], BARTLETT. Date of Service: 07/31/2015 8:00 AM Medical Record Number: [id number] Patient Account Number: [id number] Date of Birth/Sex: [birth year] (59 y.o. Female) Treating RN: Carolyne Fiscal, Debi Primary Care Physician: [name] Other Clinician: Referring Physician: [name] Treating Physician/Extender: Frann Rider in Treatment: 31 Encounter Discharge Information Items Discharge Pain Level: 0 Discharge Condition: Stable Ambulatory Status: Walker Discharge Destination: Nursing Home Transportation: Other Accompanied By: self Schedule Follow-up Appointment: Yes Medication Reconciliation completed Yes and provided to Patient/Care [name]: Provided on Clinical Summary of Care: 07/31/2015 Form Type Recipient Paper Patient LD Electronic Signature(s) Signed: 07/31/2015 4:44:47 PM By: Alric Quan Previous Signature: 07/31/2015 8:57:35 AM Version By: Ruthine Dose Entered By: Alric Quan on 07/31/2015  08:58:29 Goodell, [name] Grief ([id number]) -------------------------------------------------------------------------------- Lower Extremity Assessment Details Patient Name: Covello, Ameliana S. Date of Service: 07/31/2015 8:00 AM Medical Record Number: [id number] Patient Account Number: [id number] Date of Birth/Sex: 1956/02/09 (59 y.o. Female) Treating RN: Carolyne Fiscal, Debi Primary Care Physician: [name] Other Clinician: Referring Physician: [name] Treating Physician/Extender: Frann Rider in Treatment: 31 Edema Assessment Assessed: [Left: No] [Right: No] Edema: [Left: N] [Right: o] Vascular Assessment Pulses: Posterior Tibial  Dorsalis Pedis Palpable: [Right:Yes] Extremity colors, hair growth, and conditions: Extremity Color: [Right:Normal] Hair Growth on Extremity: [Right:Yes] Temperature of Extremity: [Right:Warm] Capillary Refill: [Right:< 3 seconds] Toe Nail Assessment Left: Right: Thick: No Discolored: No Deformed: No Improper Length and Hygiene: No Electronic Signature(s) Signed: 07/31/2015 4:44:47 PM By: Alric Quan Entered By: Alric Quan on 07/31/2015 08:19:06 Kneisley, [name] Grief ([id number]) -------------------------------------------------------------------------------- Multi Wound Chart Details [name], [name] Date of Service: 07/31/2015 8:00 AM Medical Record Number: [id number] Patient Account Number: [id number] Date of Birth/Sex: [birth year] (59 y.o. Female) Treating RN: Carolyne Fiscal, Debi Primary Care Physician: [name] Other Clinician: Referring Physician: [name] Treating Physician/Extender: Frann Rider in Treatment: 31 Vital Signs Height(in): 65 Pulse(bpm): 78 Weight(lbs): 156 Blood Pressure 209/79 (mmHg): Body Mass Index(BMI): 26 Temperature(F): 97.9 Respiratory Rate 18 (breaths/min): Photos: [1:No Photos] [8:No Photos] [N/A:N/A] Wound Location: [1:Right Back] [8:Right Lower Leg - Midline  N/A] Wounding Event: [1:Blister] [8:Gradually Appeared] [N/A:N/A] Primary Etiology: [1:Pressure Ulcer] [8:To be determined] [N/A:N/A] Comorbid History: [1:Glaucoma, Anemia, Coronary Artery Disease, Coronary Artery Disease, Hypertension, Type II Diabetes, End Stage Renal Disease, Osteoarthritis, Neuropathy Osteoarthritis, Neuropathy] [8:Glaucoma, Anemia, Hypertension, Type II Diabetes,  End Stage Renal Disease,] [N/A:N/A] Date Acquired: [1:10/19/2013] [8:07/27/2015] [N/A:N/A] Weeks of Treatment: [1:31] [8:0] [N/A:N/A] Wound Status: [1:Open] [8:Open] [N/A:N/A] Measurements L x W x D 2.5x2.3x0.1 [8:4x6x0.1] [N/A:N/A] (cm) Area (cm) : [1:4.516] [8:18.85] [N/A:N/A] Volume (cm) : [1:0.452] [8:1.885] [N/A:N/A] % Reduction in Area: [1:88.30%] [8:N/A] [N/A:N/A] % Reduction in Volume: 88.30% [8:N/A] [N/A:N/A] Classification: [1:Category/Stage II] [8:Partial Thickness] [N/A:N/A] HBO Classification: [1:N/A] [8:Grade 1] [N/A:N/A] Exudate Amount: [1:Medium] [8:Medium] [N/A:N/A] Exudate Type: [1:Sanguinous] [8:Serous] [N/A:N/A] Exudate Color: [1:red] [8:amber] [N/A:N/A] Wound Margin: [1:Indistinct, nonvisible] [8:Flat and Intact] [N/A:N/A] Granulation Amount: [1:Large (67-100%)] [8:None Present (0%)] [N/A:N/A] Granulation Quality: [1:Red] [8:N/A] [N/A:N/A] Necrotic Amount: [1:None Present (0%)] [8:Large (67-100%)] [N/A:N/A] Necrotic Tissue: [1:N/A] [8:Eschar] [N/A:N/A] Exposed Structures: [N/A:N/A] Fascia: No Fascia: No Fat: No Fat: No Tendon: No Tendon: No Muscle: No Muscle: No Joint: No Joint: No Bone: No Bone: No Limited to Skin Limited to Skin Breakdown Breakdown Epithelialization: Medium (34-66%) None N/A Periwound Skin Texture: Edema: No Edema: Yes N/A Excoriation: No Induration: No Callus: No Crepitus: No Fluctuance: No Friable: No Rash: No Scarring: No Periwound Skin Moist: Yes Moist: Yes N/A Moisture: Maceration: No Dry/Scaly: No Periwound Skin Color: Atrophie Blanche:  No No Abnormalities Noted N/A Cyanosis: No Ecchymosis: No Erythema: No Hemosiderin Staining: No Mottled: No Pallor: No Rubor: No Temperature: No Abnormality N/A N/A Tenderness on Yes No N/A Palpation: Wound Preparation: Ulcer Cleansing: Ulcer Cleansing: N/A Rinsed/Irrigated with Rinsed/Irrigated with Saline Saline Topical Anesthetic Topical Anesthetic Applied: Other: lidocaine Applied: Other: lidocaine 4% cream 4% cream Treatment Notes Electronic Signature(s) Signed: 07/31/2015 4:44:47 PM By: Alric Quan Entered By: Alric Quan on 07/31/2015 [id number] Mazzie, [name] Grief ([id number]) -------------------------------------------------------------------------------- [place] Details [name], HALL. Date of Service: 07/31/2015 8:00 AM Medical Record Number: [id number] Patient Account Number: [id number] Date of Birth/Sex: [birth year] (59 y.o. Female) Treating RN: Carolyne Fiscal, Debi Primary Care Physician: [name] Other Clinician: Referring Physician: [name] Treating Physician/Extender: Frann Rider in Treatment: 64 Active Inactive Abuse / Safety / Falls / Self Care Management Nursing Diagnoses: Potential for falls Goals: Patient will remain injury free Date Initiated: 12/23/2014 Goal Status: Active Patient/caregiver will verbalize understanding of skin care regimen Date Initiated: 12/23/2014 Goal Status: Active Patient/caregiver will verbalize/demonstrate measures taken to prevent injury and/or falls Date Initiated: 12/23/2014 Goal Status: Active Patient/caregiver will verbalize/demonstrate understanding of what to do in  case of emergency Date Initiated: 12/23/2014 Goal Status: Active Interventions: Assess fall risk on admission and as needed Assess: immobility, friction, shearing, incontinence upon admission and as needed Assess impairment of mobility on admission and as needed per policy Assess self care needs on  admission and as needed Provide education on fall prevention Notes: Wound/Skin Impairment Nursing Diagnoses: Impaired tissue integrity Knowledge deficit related to ulceration/compromised skin integrity Goals: Patient/caregiver will verbalize understanding of skin care regimen PIXIE, REASE ([id number]) Date Initiated: 12/23/2014 Goal Status: Active Ulcer/skin breakdown will heal within 14 weeks Date Initiated: 12/23/2014 Goal Status: Active Interventions: Assess patient/caregiver ability to obtain necessary supplies Assess patient/caregiver ability to perform ulcer/skin care regimen upon admission and as needed Assess ulceration(s) every visit Provide education on ulcer and skin care Treatment Activities: Skin care regimen initiated : 07/31/2015 Topical wound management initiated : 07/31/2015 Notes: Electronic Signature(s) Signed: 07/31/2015 4:44:47 PM By: Alric Quan Entered By: Alric Quan on 07/31/2015 08:26:16 Callari, [name] Grief ([id number]) -------------------------------------------------------------------------------- Pain Assessment Details [name]. Date of Service: 07/31/2015 8:00 AM Medical Record Number: [id number] Patient Account Number: [id number] Date of Birth/Sex: [birth year] (59 y.o. Female) Treating RN: Carolyne Fiscal, Debi Primary Care Physician: [name] Other Clinician: Referring Physician: [name] Treating Physician/Extender: Frann Rider in Treatment: 31 Active Problems Location of Pain Severity and Description of Pain Patient Has Paino No Site Locations Pain Management and Medication Current Pain Management: Electronic Signature(s) Signed: 07/31/2015 4:44:47 PM By: Alric Quan Entered By: Alric Quan on 07/31/2015 08:09:30 [place], [name] Grief ([id number]) -------------------------------------------------------------------------------- Patient/Caregiver Education Details [name]. Date of  Service: 07/31/2015 8:00 AM Medical Record Number: [id number] Patient Account Number: [id number] Date of Birth/Gender: 04/27/1956 (60 y.o. Female) Treating RN: Carolyne Fiscal, Debi Primary Care Physician: [name] Other Clinician: Referring Physician: [name] Treating Physician/Extender: Frann Rider in Treatment: 58 Education Assessment Education Provided To: Patient Education Topics Provided Wound/Skin Impairment: Handouts: Other: change dressing as directed Methods: Demonstration, Explain/Verbal Responses: State content correctly Electronic Signature(s) Signed: 07/31/2015 4:44:47 PM By: Alric Quan Entered By: Alric Quan on 07/31/2015 08:58:52 [name], [name] ([id number]) -------------------------------------------------------------------------------- Wound Assessment Details [name], [name] Date of Service: 07/31/2015 8:00 AM Medical Record Number: [id number] Patient Account Number: [id number] Date of Birth/Sex: 03/19/1956 (59 y.o. Female) Treating RN: Carolyne Fiscal, Debi Primary Care Physician: [name] Other Clinician: Referring Physician: [name] Treating Physician/Extender: Frann Rider in Treatment: 31 Wound Status Wound Number: 1 Primary Pressure Ulcer Etiology: Wound Location: Right Back Wound Open Wounding Event: Blister Status: Date Acquired: 10/19/2013 Comorbid Glaucoma, Anemia, Coronary Artery Weeks Of Treatment: 31 History: Disease, Hypertension, Type II Clustered Wound: No Diabetes, End Stage Renal Disease, Osteoarthritis, Neuropathy Photos Photo Uploaded By: Alric Quan on 07/31/2015 15:40:07 Wound Measurements Length: (cm) 2.5 Width: (cm) 2.3 Depth: (cm) 0.1 Area: (cm) 4.516 Volume: (cm) 0.452 % Reduction in Area: 88.3% % Reduction in Volume: 88.3% Epithelialization: Medium (34-66%) Tunneling: No Undermining: No Wound Description Classification: Category/Stage II Wound Margin:  Indistinct, nonvisible Exudate Amount: Medium Exudate Type: Sanguinous Exudate Color: red Foul Odor After Cleansing: No Wound Bed Granulation Amount: Large (67-100%) Exposed Structure Granulation Quality: Red Fascia Exposed: No Necrotic Amount: None Present (0%) Fat Layer Exposed: No [name], [name] ([id number]) Tendon Exposed: No Muscle Exposed: No Joint Exposed: No Bone Exposed: No Limited to Skin Breakdown Periwound Skin Texture Texture Color No Abnormalities Noted: No No Abnormalities Noted: No Callus: No Atrophie Blanche: No Crepitus: No Cyanosis: No Excoriation: No Ecchymosis: No Fluctuance: No Erythema:  No Friable: No Hemosiderin Staining: No Induration: No Mottled: No Localized Edema: No Pallor: No Rash: No Rubor: No Scarring: No Temperature / Pain Moisture Temperature: No Abnormality No Abnormalities Noted: No Tenderness on Palpation: Yes Dry / Scaly: No Maceration: No Moist: Yes Wound Preparation Ulcer Cleansing: Rinsed/Irrigated with Saline Topical Anesthetic Applied: Other: lidocaine 4% cream, Treatment Notes Wound #1 (Right Back) 1. Cleansed with: Clean wound with Normal Saline 2. Anesthetic Topical Lidocaine 4% cream to wound bed prior to debridement 3. Peri-wound Care: Skin Prep Notes siltec sorbact Electronic Signature(s) Signed: 07/31/2015 4:44:47 PM By: Alric Quan Entered By: Alric Quan on 07/31/2015 08:17:19 [name], [name] Grief ([id number]) -------------------------------------------------------------------------------- Wound Assessment Details Patient Name: Dahms, Avayah S. Date of Service: 07/31/2015 8:00 AM Medical Record Number: [id number] Patient Account Number: [id number] Date of Birth/Sex: 08-12-1955 (59 y.o. Female) Treating RN: Carolyne Fiscal, Debi Primary Care Physician: [name] Other Clinician: Referring Physician: [name] Treating Physician/Extender: Frann Rider in Treatment: 31 Wound  Status Wound Number: 8 Primary To be determined Etiology: Wound Location: Right Lower Leg - Midline Wound Open Wounding Event: Gradually Appeared Status: Date Acquired: 07/27/2015 Comorbid Glaucoma, Anemia, Coronary Artery Weeks Of Treatment: 0 History: Disease, Hypertension, Type II Clustered Wound: No Diabetes, End Stage Renal Disease, Osteoarthritis, Neuropathy Photos Photo Uploaded By: Alric Quan on 07/31/2015 15:40:07 Wound Measurements Length: (cm) 4 Width: (cm) 6 Depth: (cm) 0.1 Area: (cm) 18.85 Volume: (cm) 1.885 % Reduction in Area: % Reduction in Volume: Epithelialization: None Tunneling: No Undermining: No Wound Description Classification: Partial Thickness Diabetic Severity Earleen Newport): Grade 1 Wound Margin: Flat and Intact Exudate Amount: Medium Exudate Type: Serous Exudate Color: amber Wound Bed Granulation Amount: None Present (0%) Exposed Structure Necrotic Amount: Large (67-100%) Fascia Exposed: No Fenner, Jeydi S. ([id number]) Necrotic Quality: Eschar Fat Layer Exposed: No Tendon Exposed: No Muscle Exposed: No Joint Exposed: No Bone Exposed: No Limited to Skin Breakdown Periwound Skin Texture Texture Color No Abnormalities Noted: No No Abnormalities Noted: No Localized Edema: Yes Moisture No Abnormalities Noted: No Moist: Yes Wound Preparation Ulcer Cleansing: Rinsed/Irrigated with Saline Topical Anesthetic Applied: Other: lidocaine 4% cream, Treatment Notes Wound #8 (Right, Midline Lower Leg) 1. Cleansed with: Clean wound with Normal Saline 2. Anesthetic Topical Lidocaine 4% cream to wound bed prior to debridement 3. Peri-wound Care: Skin Prep 4. Dressing Applied: Mepitel 5. Secondary Dressing Applied Gauze and Kerlix/Conform Notes Mepitel one Electronic Signature(s) Signed: 07/31/2015 4:44:47 PM By: Alric Quan Entered By: Alric Quan on 07/31/2015 08:22:49 [place], [name] Grief  ([id number]) -------------------------------------------------------------------------------- Vitals Details [name]. Date of Service: 07/31/2015 8:00 AM Medical Record Number: [id number] Patient Account Number: [id number] Date of Birth/Sex: [birth year] (59 y.o. Female) Treating RN: Carolyne Fiscal, Debi Primary Care Physician: [name] Other Clinician: Referring Physician: [name] Treating Physician/Extender: Frann Rider in Treatment: 31 Vital Signs Time Taken: 08:09 Temperature (F): 97.9 Height (in): 65 Pulse (bpm): 78 Weight (lbs): 156 Respiratory Rate (breaths/min): 18 Body Mass Index (BMI): 26 Blood Pressure (mmHg): 209/79 Reference Range: 80 - 120 mg / dl Electronic Signature(s) Signed: 07/31/2015 4:44:47 PM By: Alric Quan Entered By: Alric Quan on 07/31/2015 08:11:37

## 2015-08-07 ENCOUNTER — Encounter: Payer: Medicare Other | Admitting: Surgery

## 2015-08-07 DIAGNOSIS — E11622 Type 2 diabetes mellitus with other skin ulcer: Secondary | ICD-10-CM | POA: Diagnosis not present

## 2015-08-08 NOTE — Progress Notes (Signed)
[name] ([id number]) Visit Report for 08/07/2015 Arrival Information Details [name]. Date of Service: 08/07/2015 2:15 PM Medical Record Number: [id number] Patient Account Number: [id number] Date of Birth/Sex: 03/20/1956 (60 y.o. Female) Treating RN: [name], RN, [name], [name] Primary Care Physician: [name] Other Clinician: Referring Physician: [name] Treating Physician/Extender: Frann Rider in Treatment: 71 Visit Information History Since Last Visit Added or deleted any medications: No Patient Arrived: Walker Any new allergies or adverse reactions: No Arrival Time: 13:28 Had a fall or experienced change in No Accompanied By: self activities of daily living that may affect Transfer Assistance: None risk of falls: Patient Identification Verified: Yes Signs or symptoms of abuse/neglect since last No Secondary Verification Process Yes visito Completed: Hospitalized since last visit: No Patient Requires Transmission- No Has Dressing in Place as Prescribed: Yes Based Precautions: Pain Present Now: No Patient Has Alerts: Yes Patient Alerts: Patient on Blood Thinner ABI R: 1.28 Electronic Signature(s) Signed: 08/07/2015 3:13:08 PM By: Regan Lemming [name], RN Entered By: Regan Lemming on 08/07/2015 13:30:09 [name], [name] ([id number]) -------------------------------------------------------------------------------- Clinic Level of Care Assessment Details [name]. Date of Service: 08/07/2015 2:15 PM Medical Record Number: [id number] Patient Account Number: [id number] Date of Birth/Sex: 1955/12/02 (60 y.o. Female) Treating RN: [name], RN, [name], [name] Primary Care Physician: [name] Other Clinician: Referring Physician: [name] Treating Physician/Extender: Frann Rider in Treatment: 32 Clinic Level of Care Assessment Items TOOL 4 Quantity Score []  - Use when only an EandM is performed on FOLLOW-UP visit  0 ASSESSMENTS - Nursing Assessment / Reassessment X - Reassessment of Co-morbidities (includes updates in patient status) 1 10 X - Reassessment of Adherence to Treatment Plan 1 5 ASSESSMENTS - Wound and Skin Assessment / Reassessment []  - Simple Wound Assessment / Reassessment - one wound 0 X - Complex Wound Assessment / Reassessment - multiple wounds 2 5 []  - Dermatologic / Skin Assessment (not related to wound area) 0 ASSESSMENTS - Focused Assessment []  - Circumferential Edema Measurements - multi extremities 0 []  - Nutritional Assessment / Counseling / Intervention 0 X - Lower Extremity Assessment (monofilament, tuning fork, pulses) 1 5 []  - Peripheral Arterial Disease Assessment (using hand held doppler) 0 ASSESSMENTS - Ostomy and/or Continence Assessment and Care []  - Incontinence Assessment and Management 0 []  - Ostomy Care Assessment and Management (repouching, etc.) 0 PROCESS - Coordination of Care X - Simple Patient / Family Education for ongoing care 1 15 []  - Complex (extensive) Patient / Family Education for ongoing care 0 []  - Staff obtains Programmer, systems, Records, Test Results / Process Orders 0 []  - Staff telephones HHA, Nursing Homes / Clarify orders / etc 0 []  - Routine Transfer to another Facility (non-emergent condition) 0 [name], [name] ([id number]) []  - Routine Hospital Admission (non-emergent condition) 0 []  - New Admissions / Biomedical engineer / Ordering NPWT, Apligraf, etc. 0 []  - Emergency Hospital Admission (emergent condition) 0 []  - Simple Discharge Coordination 0 []  - Complex (extensive) Discharge Coordination 0 PROCESS - Special Needs []  - Pediatric / Minor Patient Management 0 []  - Isolation Patient Management 0 []  - Hearing / Language / Visual special needs 0 []  - Assessment of Community assistance (transportation, D/C planning, etc.) 0 []  - Additional assistance / Altered mentation 0 []  - Support Surface(s) Assessment (bed, cushion, seat, etc.)  0 INTERVENTIONS - Wound Cleansing / Measurement []  - Simple Wound Cleansing - one wound 0 X - Complex Wound Cleansing - multiple wounds 2  5 X - Wound Imaging (photographs - any number of wounds) 1 5 []  - Wound Tracing (instead of photographs) 0 []  - Simple Wound Measurement - one wound 0 []  - Complex Wound Measurement - multiple wounds 0 INTERVENTIONS - Wound Dressings X - Small Wound Dressing one or multiple wounds 2 10 []  - Medium Wound Dressing one or multiple wounds 0 []  - Large Wound Dressing one or multiple wounds 0 []  - Application of Medications - topical 0 []  - Application of Medications - injection 0 INTERVENTIONS - Miscellaneous []  - External ear exam 0 [name], [name] ([id number]) []  - Specimen Collection (cultures, biopsies, blood, body fluids, etc.) 0 []  - Specimen(s) / Culture(s) sent or taken to Lab for analysis 0 []  - Patient Transfer (multiple staff / Harrel Lemon Lift / Similar devices) 0 []  - Simple Staple / Suture removal (25 or less) 0 []  - Complex Staple / Suture removal (26 or more) 0 []  - Hypo / Hyperglycemic Management (close monitor of Blood Glucose) 0 []  - Ankle / Brachial Index (ABI) - do not check if billed separately 0 X - Vital Signs 1 5 Has the patient been seen at the hospital within the last three years: Yes Total Score: 85 Level Of Care: New/Established - Level 3 Electronic Signature(s) Signed: 08/07/2015 3:13:08 PM By: Regan Lemming [name], RN Entered By: Regan Lemming on 08/07/2015 13:45:20 [name], [name] ([id number]) -------------------------------------------------------------------------------- Encounter Discharge Information Details [name]. Date of Service: 08/07/2015 2:15 PM Medical Record Number: [id number] Patient Account Number: [id number] Date of Birth/Sex: March 06, 1956 (60 y.o. Female) Treating RN: Baruch Gouty, RN, [name], [name] Primary Care Physician: [name] Other Clinician: Referring Physician: [name] Treating  Physician/Extender: Frann Rider in Treatment: 35 Encounter Discharge Information Items Discharge Pain Level: 0 Discharge Condition: Stable Ambulatory Status: Walker Discharge Destination: Nursing Home Transportation: Other Accompanied By: self Schedule Follow-up Appointment: No Medication Reconciliation completed No and provided to Patient/Care Dymin Dingledine: Provided on Clinical Summary of Care: 08/07/2015 Form Type Recipient Paper Patient LD Electronic Signature(s) Signed: 08/07/2015 3:13:08 PM By: Regan Lemming [name], RN Previous Signature: 08/07/2015 1:44:47 PM Version By: Ruthine Dose Entered By: Regan Lemming on 08/07/2015 13:46:26 [place], [name] ([id number]) -------------------------------------------------------------------------------- Lower Extremity Assessment Details Patient Name: Barros, [name] Date of Service: 08/07/2015 2:15 PM Medical Record Number: [id number] Patient Account Number: [id number] Date of Birth/Sex: 1956-04-10 (60 y.o. Female) Treating RN: [name], RN, [name], [name] Primary Care Physician: [name] Other Clinician: Referring Physician: [name] Treating Physician/Extender: Frann Rider in Treatment: 32 Edema Assessment Assessed: [Left: No] [Right: No] Edema: [Left: N] [Right: o] Vascular Assessment Claudication: Claudication Assessment [Right:None] Pulses: Posterior Tibial Dorsalis Pedis Palpable: [Right:Yes] Extremity colors, hair growth, and conditions: Extremity Color: [Right:Mottled] Hair Growth on Extremity: [Right:Yes] Temperature of Extremity: [Right:Warm] Capillary Refill: [Right:< 3 seconds] Toe Nail Assessment Left: Right: Thick: Yes Discolored: Yes Deformed: No Improper Length and Hygiene: No Electronic Signature(s) Signed: 08/07/2015 3:13:08 PM By: Regan Lemming [name], RN Entered By: Regan Lemming on 08/07/2015 13:31:49 [place], [name]  ([id number]) -------------------------------------------------------------------------------- Multi Wound Chart Details [name], [name] Date of Service: 08/07/2015 2:15 PM Medical Record Number: [id number] Patient Account Number: [id number] Date of Birth/Sex: [birth year] (60 y.o. Female) Treating RN: Baruch Gouty, RN, [name], [name] Primary Care Physician: [name] Other Clinician: Referring Physician: [name] Treating Physician/Extender: Frann Rider in Treatment: 32 Vital Signs Height(in): 65 Pulse(bpm): 82 Weight(lbs): 156 Blood Pressure 134/55 (mmHg): Body Mass Index(BMI): 26 Temperature(F): 98.2 Respiratory Rate 17 (breaths/min): Photos: [  1:No Photos] [8:No Photos] [N/A:N/A] Wound Location: [1:Right Back] [8:Right Lower Leg - Midline N/A] Wounding Event: [1:Blister] [8:Gradually Appeared] [N/A:N/A] Primary Etiology: [1:Pressure Ulcer] [8:To be determined] [N/A:N/A] Comorbid History: [1:Glaucoma, Anemia, Coronary Artery Disease, Coronary Artery Disease, Hypertension, Type II Diabetes, End Stage Renal Disease, Osteoarthritis, Neuropathy Osteoarthritis, Neuropathy] [8:Glaucoma, Anemia, Hypertension, Type II Diabetes,  End Stage Renal Disease,] [N/A:N/A] Date Acquired: [1:10/19/2013] [8:07/27/2015] [N/A:N/A] Weeks of Treatment: [1:32] [8:1] [N/A:N/A] Wound Status: [1:Open] [8:Open] [N/A:N/A] Measurements L x W x D 1.8x2.5x0.1 [8:1.5x4x0.1] [N/A:N/A] (cm) Area (cm) : [1:3.534] [8:4.712] [N/A:N/A] Volume (cm) : [1:0.353] [8:0.471] [N/A:N/A] % Reduction in Area: [1:90.80%] [8:75.00%] [N/A:N/A] % Reduction in Volume: 90.80% [8:75.00%] [N/A:N/A] Classification: [1:Category/Stage II] [8:Partial Thickness] [N/A:N/A] HBO Classification: [1:N/A] [8:Grade 1] [N/A:N/A] Exudate Amount: [1:Medium] [8:Medium] [N/A:N/A] Exudate Type: [1:Sanguinous] [8:Serous] [N/A:N/A] Exudate Color: [1:red] [8:amber] [N/A:N/A] Wound Margin: [1:Indistinct, nonvisible] [8:Flat and  Intact] [N/A:N/A] Granulation Amount: [1:Large (67-100%)] [8:None Present (0%)] [N/A:N/A] Granulation Quality: [1:Red] [8:N/A] [N/A:N/A] Necrotic Amount: [1:None Present (0%)] [8:None Present (0%)] [N/A:N/A] Exposed Structures: [1:Fascia: No Fat: No] [8:Fascia: No Fat: No] [N/A:N/A] Tendon: No Tendon: No Muscle: No Muscle: No Joint: No Joint: No Bone: No Bone: No Limited to Skin Limited to Skin Breakdown Breakdown Epithelialization: Medium (34-66%) None N/A Periwound Skin Texture: Edema: No Edema: Yes N/A Excoriation: No Induration: No Callus: No Crepitus: No Fluctuance: No Friable: No Rash: No Scarring: No Periwound Skin Moist: Yes Moist: Yes N/A Moisture: Maceration: No Dry/Scaly: No Periwound Skin Color: Atrophie Blanche: No No Abnormalities Noted N/A Cyanosis: No Ecchymosis: No Erythema: No Hemosiderin Staining: No Mottled: No Pallor: No Rubor: No Temperature: No Abnormality N/A N/A Tenderness on No No N/A Palpation: Wound Preparation: Ulcer Cleansing: Ulcer Cleansing: N/A Rinsed/Irrigated with Rinsed/Irrigated with Saline Saline Topical Anesthetic Topical Anesthetic Applied: None Applied: None Treatment Notes Electronic Signature(s) Signed: 08/07/2015 3:13:08 PM By: Regan Lemming [name], RN Entered By: Regan Lemming on 08/07/2015 13:43:57 [name], [name] ([id number]) -------------------------------------------------------------------------------- [place] Details [name]. Date of Service: 08/07/2015 2:15 PM Medical Record Number: [id number] Patient Account Number: [id number] Date of Birth/Sex: 02-Nov-1955 (60 y.o. Female) Treating RN: [name], RN, [name], [name] Primary Care Physician: [name] Other Clinician: Referring Physician: [name] Treating Physician/Extender: Frann Rider in Treatment: 1 Active Inactive Abuse / Safety / Falls / Self Care Management Nursing Diagnoses: Potential for  falls Goals: Patient will remain injury free Date Initiated: 12/23/2014 Goal Status: Active Patient/caregiver will verbalize understanding of skin care regimen Date Initiated: 12/23/2014 Goal Status: Active Patient/caregiver will verbalize/demonstrate measures taken to prevent injury and/or falls Date Initiated: 12/23/2014 Goal Status: Active Patient/caregiver will verbalize/demonstrate understanding of what to do in case of emergency Date Initiated: 12/23/2014 Goal Status: Active Interventions: Assess fall risk on admission and as needed Assess: immobility, friction, shearing, incontinence upon admission and as needed Assess impairment of mobility on admission and as needed per policy Assess self care needs on admission and as needed Provide education on fall prevention Notes: Wound/Skin Impairment Nursing Diagnoses: Impaired tissue integrity Knowledge deficit related to ulceration/compromised skin integrity Goals: Patient/caregiver will verbalize understanding of skin care regimen NYELLE, BAUDER ([id number]) Date Initiated: 12/23/2014 Goal Status: Active Ulcer/skin breakdown will heal within 14 weeks Date Initiated: 12/23/2014 Goal Status: Active Interventions: Assess patient/caregiver ability to obtain necessary supplies Assess patient/caregiver ability to perform ulcer/skin care regimen upon admission and as needed Assess ulceration(s) every visit Provide education on ulcer and skin care Treatment Activities: Skin care regimen initiated : 08/07/2015 Topical wound management initiated : 08/07/2015  Notes: Electronic Signature(s) Signed: 08/07/2015 3:13:08 PM By: Regan Lemming [name], RN Entered By: Regan Lemming on 08/07/2015 13:39:51 [place], [name] ([id number]) -------------------------------------------------------------------------------- Pain Assessment Details [name]. Date of Service: 08/07/2015 2:15 PM Medical Record Number: [id number] Patient Account Number:  [id number] Date of Birth/Sex: [birth year] (60 y.o. Female) Treating RN: Baruch Gouty, RN, [name], [name] Primary Care Physician: [name] Other Clinician: Referring Physician: [name] Treating Physician/Extender: Frann Rider in Treatment: 32 Active Problems Location of Pain Severity and Description of Pain Patient Has Paino No Site Locations Pain Management and Medication Current Pain Management: Electronic Signature(s) Signed: 08/07/2015 3:13:08 PM By: Regan Lemming [name], RN Entered By: Regan Lemming on 08/07/2015 13:30:15 [place], [name] ([id number]) -------------------------------------------------------------------------------- Patient/Caregiver Education Details [name]. Date of Service: 08/07/2015 2:15 PM Medical Record Number: [id number] Patient Account Number: [id number] Date of Birth/Gender: 17-Apr-1956 (60 y.o. Female) Treating RN: Baruch Gouty, RN, [name], [name] Primary Care Physician: [name] Other Clinician: Referring Physician: [name] Treating Physician/Extender: Frann Rider in Treatment: 39 Education Assessment Education Provided To: Patient Education Topics Provided Safety: Methods: Explain/Verbal Responses: State content correctly Wound/Skin Impairment: Methods: Explain/Verbal Responses: State content correctly Electronic Signature(s) Signed: 08/07/2015 3:13:08 PM By: Regan Lemming [name], RN Entered By: Regan Lemming on 08/07/2015 13:46:43 [place], [name] ([id number]) -------------------------------------------------------------------------------- Wound Assessment Details Patient Name: Looper, Cathlene S. Date of Service: 08/07/2015 2:15 PM Medical Record Number: [id number] Patient Account Number: [id number] Date of Birth/Sex: 1956-04-28 (60 y.o. Female) Treating RN: [name], RN, [name], [name] Primary Care Physician: [name] Other Clinician: Referring Physician: [name] Treating Physician/Extender: Frann Rider in  Treatment: 32 Wound Status Wound Number: 1 Primary Pressure Ulcer Etiology: Wound Location: Right Back Wound Open Wounding Event: Blister Status: Date Acquired: 10/19/2013 Comorbid Glaucoma, Anemia, Coronary Artery Weeks Of Treatment: 32 History: Disease, Hypertension, Type II Clustered Wound: No Diabetes, End Stage Renal Disease, Osteoarthritis, Neuropathy Photos Photo Uploaded By: Regan Lemming on 08/07/2015 15:03:01 Wound Measurements Length: (cm) 1.8 Width: (cm) 2.5 Depth: (cm) 0.1 Area: (cm) 3.534 Volume: (cm) 0.353 % Reduction in Area: 90.8% % Reduction in Volume: 90.8% Epithelialization: Medium (34-66%) Tunneling: No Undermining: No Wound Description Classification: Category/Stage II Wound Margin: Indistinct, nonvisible Exudate Amount: Medium Exudate Type: Sanguinous Exudate Color: red Foul Odor After Cleansing: No Wound Bed Granulation Amount: Large (67-100%) Exposed Structure Granulation Quality: Red Fascia Exposed: No Necrotic Amount: None Present (0%) Fat Layer Exposed: No [place], [name] ([id number]) Tendon Exposed: No Muscle Exposed: No Joint Exposed: No Bone Exposed: No Limited to Skin Breakdown Periwound Skin Texture Texture Color No Abnormalities Noted: No No Abnormalities Noted: No Callus: No Atrophie Blanche: No Crepitus: No Cyanosis: No Excoriation: No Ecchymosis: No Fluctuance: No Erythema: No Friable: No Hemosiderin Staining: No Induration: No Mottled: No Localized Edema: No Pallor: No Rash: No Rubor: No Scarring: No Temperature / Pain Moisture Temperature: No Abnormality No Abnormalities Noted: No Dry / Scaly: No Maceration: No Moist: Yes Wound Preparation Ulcer Cleansing: Rinsed/Irrigated with Saline Topical Anesthetic Applied: None Treatment Notes Wound #1 (Right Back) 1. Cleansed with: Clean wound with Normal Saline 3. Peri-wound Care: Skin Prep 4. Dressing Applied: Other dressing (specify in  notes) Notes siltec Sorbact Electronic Signature(s) Signed: 08/07/2015 3:13:08 PM By: Regan Lemming [name], RN Entered By: Regan Lemming on 08/07/2015 13:39:20 [name], [name] ([id number]) -------------------------------------------------------------------------------- Wound Assessment Details [name], [name] Date of Service: 08/07/2015 2:15 PM Medical Record Number: [id number] Patient Account Number: [id number] Date of Birth/Sex: [birth year] (60 y.o. Female) Treating  RN: Baruch Gouty, RN, [name], [place] Primary Care Physician: [name] Other Clinician: Referring Physician: [name] Treating Physician/Extender: Frann Rider in Treatment: 32 Wound Status Wound Number: 8 Primary To be determined Etiology: Wound Location: Right Lower Leg - Midline Wound Open Wounding Event: Gradually Appeared Status: Date Acquired: 07/27/2015 Comorbid Glaucoma, Anemia, Coronary Artery Weeks Of Treatment: 1 History: Disease, Hypertension, Type II Clustered Wound: No Diabetes, End Stage Renal Disease, Osteoarthritis, Neuropathy Photos Photo Uploaded By: Regan Lemming on 08/07/2015 15:03:01 Wound Measurements Length: (cm) 1.5 Width: (cm) 4 Depth: (cm) 0.1 Area: (cm) 4.712 Volume: (cm) 0.471 % Reduction in Area: 75% % Reduction in Volume: 75% Epithelialization: None Tunneling: No Undermining: No Wound Description Classification: Partial Thickness Diabetic Severity (Wagner): Grade 1 Wound Margin: Flat and Intact Exudate Amount: Medium Exudate Type: Serous Exudate Color: amber Wound Bed Granulation Amount: None Present (0%) Exposed Structure Necrotic Amount: None Present (0%) Fascia Exposed: No [name], [name] ([id number]) Fat Layer Exposed: No Tendon Exposed: No Muscle Exposed: No Joint Exposed: No Bone Exposed: No Limited to Skin Breakdown Periwound Skin Texture Texture Color No Abnormalities Noted: No No Abnormalities Noted: No Localized Edema: Yes Moisture No  Abnormalities Noted: No Moist: Yes Wound Preparation Ulcer Cleansing: Rinsed/Irrigated with Saline Topical Anesthetic Applied: None Treatment Notes Wound #8 (Right, Midline Lower Leg) 1. Cleansed with: Clean wound with Normal Saline 3. Peri-wound Care: Skin Prep 4. Dressing Applied: Other dressing (specify in notes) Notes siltec Sorbact Electronic Signature(s) Signed: 08/07/2015 3:13:08 PM By: Regan Lemming [name], RN Entered By: Regan Lemming on 08/07/2015 13:39:38 [name], [name] ([id number]) -------------------------------------------------------------------------------- Vitals Details [name]. Date of Service: 08/07/2015 2:15 PM Medical Record Number: [id number] Patient Account Number: [id number] Date of Birth/Sex: 26-Dec-1955 (60 y.o. Female) Treating RN: [name], RN, [name], [name] Primary Care Physician: [name] Other Clinician: Referring Physician: [name] Treating Physician/Extender: Frann Rider in Treatment: 32 Vital Signs Time Taken: 13:30 Temperature (F): 98.2 Height (in): 65 Pulse (bpm): 82 Weight (lbs): 156 Respiratory Rate (breaths/min): 17 Body Mass Index (BMI): 26 Blood Pressure (mmHg): 134/55 Reference Range: 80 - 120 mg / dl Electronic Signature(s) Signed: 08/07/2015 3:13:08 PM By: Regan Lemming [name], RN Entered By: Regan Lemming on 08/07/2015 13:31:01

## 2015-08-08 NOTE — Progress Notes (Addendum)
LEAHANN, WITHERINGTON ([id number]) Visit Report for 08/07/2015 Chief Complaint Document Details Patient Name: ILAISAANE, [name]. Date of Service: 08/07/2015 2:15 PM Medical Record Patient Account Number: [id number] Number: [name], RN, [name], Treating RN: 01-27-1956 (60 y.o. [name] Date of Birth/Sex: Female) Other Clinician: Primary Care Physician: Brynda Greathouse, ERNEST Treating Hussien Greenblatt Referring Physician: [name] Physician/Extender: [name]: 103 Information Obtained from: Patient Chief Complaint Patient presents to the wound care center for a consult due non healing wound. She has an open wound on her right upper back which she's had for about a year and she recently noticed a blister on her right lower extremity about 2 weeks ago. Electronic Signature(s) Signed: 08/07/2015 1:41:30 PM By: [name], [name] Entered By: [name] on 08/07/2015 13:41:30 Kidane, [name] ([id number]) -------------------------------------------------------------------------------- HPI Details [name]. Date of Service: 08/07/2015 2:15 PM Medical Record Patient Account Number: [id number] Number: [name], RN, [name], Treating RN: 08/02/1955 (60 y.o. [name] Date of Birth/Sex: Female) Other Clinician: Primary Care Physician: Brynda Greathouse, ERNEST Treating Geena Weinhold Referring Physician: [name] Physician/Extender: [name]: 32 History of Present Illness Location: right upper back and right lower extremity wounds Quality: Patient reports No Pain. Severity: Patient states wound (s) are getting better. Duration: Patient has had the wound for > 3 months prior to seeking treatment at the wound center Timing: she thought it first occurred when she was using a heating pad about a year ago after back surgery. Context: The wound appeared gradually over time Modifying Factors: Patient is currently on renal dialysis and receives treatments 3 times  weekly Associated Signs and Symptoms: Patient reports having: surgery scheduled for this week for a AV fistula left arm. HPI Description: 60 year old patient who is known to be a diabetic and has end-stage renal disease has had several comorbidities including coronary artery disease, hypertension, hyperlipidemia, pancreatitis, anemia, previous history of hysterectomy, cholecystectomy, left-sided salivary gland excision, bilateral cataract surgery,Peritoneal dialysis catheter, hemodialysis catheter. the area on the back has also been caused by instant pressure she used to sleep on a recliner all day and has significant kyphoscoliosis. As far as the wound on her right lower extremity she's not sure how this blister occurred but she thought it has been there for about 2 weeks. No recent blood investigations available and no recent hemoglobin A1c. 12/30/2014 -- she is an assisted living facility but I believe the nurses that have not followed instructions as she had some cream applied on her back and there was a different dressing. Last week she's had a AV fistula placed on her left forearm. 01/13/2015 -- she has had some localized infection at the port site and she's been on doxycycline for this. 02/05/2015 - he has developed a small blister on her right lower extremity. 02/10/2015 -- she has developed another small blister on her right anterior chest wall in the area where she's had tape for her dialysis access. this may just be injury caused by a tape burn. 02/18/2015 -- no new blisters and she had a dermatology opinion and they have taken a biopsy of her skin. She also had a left brachial AV fistula placed this week. 03/03/2015 -- though we do not have the pathology report yet the patient says she has been put on prednisone because this skin disease is possibly to do with her immune system and her dermatologist is recommended this. 03/10/2015 -- she has developed a new blister which is quite  large on her right lower  extremity on the shin. 05/22/2015 -- she did very well with her dressing but on trying to remove the [place] it peels away Schleicher, Jourden S. ([id number]) the newly healed skin. 06/05/2015 -- the patient was in the ER this past week for severe bleeding from the right nostril and had to have ENT see her and do a packing. They've also been holding her heparin during her dialysis. The patient is going back to ENT today. Other than that she has had no other significant issues. 06/19/2015 -- she is back on her blood thinners and continues to have her hemodialysis as before. 07/31/2015 -- a large blister has popped up again on her right lower extremity in the same place in the mid shin area in the anterior lateral compartment. Electronic Signature(s) Signed: 08/07/2015 1:41:34 PM By: [name], [name] Entered By: [name] on 08/07/2015 13:41:34 Louvier, [name] ([id number]) -------------------------------------------------------------------------------- Physical Exam Details Patient Name: [name], [name] Date of Service: 08/07/2015 2:15 PM Medical Record Patient Account Number: [id number] Number: [name], RN, [name], Treating RN: 1955/10/26 (60 y.o. [name] Date of Birth/Sex: Female) Other Clinician: Primary Care Physician: Brynda Greathouse, ERNEST Treating Alika Eppes Referring Physician: [name] Physician/Extender: [name]: 32 Constitutional . Pulse regular. Respirations normal and unlabored. Afebrile. . Eyes Nonicteric. Reactive to light. Ears, Nose, Mouth, and Throat Lips, teeth, and gums WNL.Marland Kitchen Moist mucosa without lesions. Neck supple and nontender. No palpable supraclavicular or cervical adenopathy. Normal sized without goiter. Respiratory WNL. No retractions.. Cardiovascular Pedal Pulses WNL. No clubbing, cyanosis or edema. Lymphatic No adneopathy. No adenopathy. No adenopathy. Musculoskeletal Adexa without tenderness or  enlargement.. Digits and nails w/o clubbing, cyanosis, infection, petechiae, ischemia, or inflammatory conditions.. Integumentary (Hair, Skin) No suspicious lesions. No crepitus or fluctuance. No peri-wound warmth or erythema. No masses.Marland Kitchen Psychiatric Judgement and insight Intact.. No evidence of depression, anxiety, or agitation.. Notes the back is looking excellent and there is a very small area open. Her right lower extremity has a larger blister which is ulcerated and she has some surrounding tenderness. Electronic Signature(s) Signed: 08/07/2015 1:42:11 PM By: [name], [name] Entered By: [name] on 08/07/2015 13:42:11 Dazey, [name] ([id number]) -------------------------------------------------------------------------------- Physician Orders Details Patient Name: [name] Date of Service: 08/07/2015 2:15 PM Medical Record Patient Account Number: [id number] Number: [name], RN, [name], Treating RN: 12-Aug-1955 (60 y.o. [name] Date of Birth/Sex: Female) Other Clinician: Primary Care Physician: Brynda Greathouse, ERNEST Treating Hadrian Yarbrough Referring Physician: [name] Physician/Extender: Suella [place] in Treatment: 59 Verbal / Phone Orders: Yes Clinician: [name], RN, [name], [name] Read Back and Verified: Yes Diagnosis Coding ICD-10 Coding Code Description E11.622 Type 2 diabetes mellitus with other skin ulcer L89.112 Pressure ulcer of right upper back, stage 2 S80.821A Blister (nonthermal), right lower leg, initial encounter N18.6 End stage renal disease Wound Cleansing Wound #1 Right Back o Clean wound with Normal Saline. Wound #8 Right,Midline Lower Leg o Clean wound with Normal Saline. Anesthetic Wound #1 Right Back o Topical Lidocaine 4% cream applied to wound bed prior to debridement Wound #8 Right,Midline Lower Leg o Topical Lidocaine 4% cream applied to wound bed prior to debridement Skin Barriers/Peri-Wound Care Wound #1 Right Back o Skin  Prep Wound #8 Right,Midline Lower Leg o Skin Prep Primary Wound Dressing Wound #1 Right Back o Other: - siltec sorbact Wound #8 Right,Midline Lower Leg o Other: - siltec sorbact Morrell, Ashantae S. ([id number]) Dressing Change Frequency Wound #1 Right Back o Change dressing every week - unless saturated Wound #8  Right,Midline Lower Leg o Other: - only change if it needs to be Follow-up Appointments Wound #1 Right Back o Return Appointment in 1 week. Electronic Signature(s) Signed: 08/07/2015 3:13:08 PM By: Regan Lemming [name], RN Signed: 08/07/2015 4:37:54 PM By: [name], [name] Entered By: Regan Lemming on 08/07/2015 13:44:47 Akkerman, [name] ([id number]) -------------------------------------------------------------------------------- Problem List Details Patient Name: GWYNETH, ROUSSELLE. Date of Service: 08/07/2015 2:15 PM Medical Record Patient Account Number: [id number] Number: [name], RN, [name], Treating RN: 1956/03/22 (60 y.o. [name] Date of Birth/Sex: Female) Other Clinician: Primary Care Physician: [name] Treating [name] Referring Physician: [name] Physician/Extender: [name]: 32 Active Problems ICD-10 Encounter Code Description Active Date Diagnosis E11.622 Type 2 diabetes mellitus with other skin ulcer 12/23/2014 Yes L89.112 Pressure ulcer of right upper back, stage 2 12/23/2014 Yes S80.821A Blister (nonthermal), right lower leg, initial encounter 12/23/2014 Yes N18.6 End stage renal disease 12/23/2014 Yes Inactive Problems Resolved Problems Electronic Signature(s) Signed: 08/07/2015 1:41:24 PM By: [name], [name] Entered By: [name] on 08/07/2015 13:41:24 Gulick, [name] ([id number]) -------------------------------------------------------------------------------- Progress Note Details Patient Name: Gagan, Yalitza S. Date of Service: 08/07/2015 2:15 PM Medical Record Patient Account Number:  [id number] Number: [name], RN, [name], Treating RN: 1956/03/02 (60 y.o. [name] Date of Birth/Sex: Female) Other Clinician: Primary Care Physician: Brynda Greathouse, ERNEST Treating Vollie Aaron Referring Physician: [name] Physician/Extender: [name]: 23 Subjective Chief Complaint Information obtained from Patient Patient presents to the wound care center for a consult due non healing wound. She has an open wound on her right upper back which she's had for about a year and she recently noticed a blister on her right lower extremity about 2 weeks ago. History of Present Illness (HPI) The following HPI elements were documented for the patient's wound: Location: right upper back and right lower extremity wounds Quality: Patient reports No Pain. Severity: Patient states wound (s) are getting better. Duration: Patient has had the wound for > 3 months prior to seeking treatment at the wound center Timing: she thought it first occurred when she was using a heating pad about a year ago after back surgery. Context: The wound appeared gradually over time Modifying Factors: Patient is currently on renal dialysis and receives treatments 3 times weekly Associated Signs and Symptoms: Patient reports having: surgery scheduled for this week for a AV fistula left arm. 60 year old patient who is known to be a diabetic and has end-stage renal disease has had several comorbidities including coronary artery disease, hypertension, hyperlipidemia, pancreatitis, anemia, previous history of hysterectomy, cholecystectomy, left-sided salivary gland excision, bilateral cataract surgery,Peritoneal dialysis catheter, hemodialysis catheter. the area on the back has also been caused by instant pressure she used to sleep on a recliner all day and has significant kyphoscoliosis. As far as the wound on her right lower extremity she's not sure how this blister occurred but she thought it has been there for  about 2 weeks. No recent blood investigations available and no recent hemoglobin A1c. 12/30/2014 -- she is an assisted living facility but I believe the nurses that have not followed instructions as she had some cream applied on her back and there was a different dressing. Last week she's had a AV fistula placed on her left forearm. 01/13/2015 -- she has had some localized infection at the port site and she's been on doxycycline for this. 02/05/2015 - he has developed a small blister on her right lower extremity. 02/10/2015 -- she has developed another small blister on  her right anterior chest wall in the area where she's had tape for her dialysis access. this may just be injury caused by a tape burn. SHEMEEKA, GRANLUND ([id number]) 02/18/2015 -- no new blisters and she had a dermatology opinion and they have taken a biopsy of her skin. She also had a left brachial AV fistula placed this week. 03/03/2015 -- though we do not have the pathology report yet the patient says she has been put on prednisone because this skin disease is possibly to do with her immune system and her dermatologist is recommended this. 03/10/2015 -- she has developed a new blister which is quite large on her right lower extremity on the shin. 05/22/2015 -- she did very well with her dressing but on trying to remove the [place] it peels away the newly healed skin. 06/05/2015 -- the patient was in the ER this past week for severe bleeding from the right nostril and had to have ENT see her and do a packing. They've also been holding her heparin during her dialysis. The patient is going back to ENT today. Other than that she has had no other significant issues. 06/19/2015 -- she is back on her blood thinners and continues to have her hemodialysis as before. 07/31/2015 -- a large blister has popped up again on her right lower extremity in the same place in the mid shin area in the anterior lateral  compartment. Objective Constitutional Pulse regular. Respirations normal and unlabored. Afebrile. Vitals Time Taken: 1:30 PM, Height: 65 in, Weight: 156 lbs, BMI: 26, Temperature: 98.2 F, Pulse: 82 bpm, Respiratory Rate: 17 breaths/min, Blood Pressure: 134/55 mmHg. Eyes Nonicteric. Reactive to light. Ears, Nose, Mouth, and Throat Lips, teeth, and gums WNL.Marland Kitchen Moist mucosa without lesions. Neck supple and nontender. No palpable supraclavicular or cervical adenopathy. Normal sized without goiter. Respiratory WNL. No retractions.. Cardiovascular Pedal Pulses WNL. No clubbing, cyanosis or edema. Lymphatic No adneopathy. No adenopathy. No adenopathy. [name] ([id number]) Musculoskeletal Adexa without tenderness or enlargement.. Digits and nails w/o clubbing, cyanosis, infection, petechiae, ischemia, or inflammatory conditions.Marland Kitchen Psychiatric Judgement and insight Intact.. No evidence of depression, anxiety, or agitation.. General Notes: the back is looking excellent and there is a very small area open. Her right lower extremity has a larger blister which is ulcerated and she has some surrounding tenderness. Integumentary (Hair, Skin) No suspicious lesions. No crepitus or fluctuance. No peri-wound warmth or erythema. No masses.. Wound #1 status is Open. Original cause of wound was Blister. The wound is located on the Right Back. The wound measures 1.8cm length x 2.5cm width x 0.1cm depth; 3.534cm^2 area and 0.353cm^3 volume. The wound is limited to skin breakdown. There is no tunneling or undermining noted. There is a medium amount of sanguinous drainage noted. The wound margin is indistinct and nonvisible. There is large (67- 100%) red granulation within the wound bed. There is no necrotic tissue within the wound bed. The periwound skin appearance exhibited: Moist. The periwound skin appearance did not exhibit: Callus, Crepitus, Excoriation, Fluctuance, Friable, Induration,  Localized Edema, Rash, Scarring, Dry/Scaly, Maceration, Atrophie Blanche, Cyanosis, Ecchymosis, Hemosiderin Staining, Mottled, Pallor, Rubor, Erythema. Periwound temperature was noted as No Abnormality. Wound #8 status is Open. Original cause of wound was Gradually Appeared. The wound is located on the Right,Midline Lower Leg. The wound measures 1.5cm length x 4cm width x 0.1cm depth; 4.712cm^2 area and 0.471cm^3 volume. The wound is limited to skin breakdown. There is no tunneling or undermining noted. There is a medium  amount of serous drainage noted. The wound margin is flat and intact. There is no granulation within the wound bed. There is no necrotic tissue within the wound bed. The periwound skin appearance exhibited: Localized Edema, Moist. Assessment Active Problems ICD-10 E11.622 - Type 2 diabetes mellitus with other skin ulcer L89.112 - Pressure ulcer of right upper back, stage 2 S80.821A - Blister (nonthermal), right lower leg, initial encounter N18.6 - End stage renal disease Man, Abbygale S. ([id number]) Plan Wound Cleansing: Wound #1 Right Back: Clean wound with Normal Saline. Wound #8 Right,Midline Lower Leg: Clean wound with Normal Saline. Anesthetic: Wound #1 Right Back: Topical Lidocaine 4% cream applied to wound bed prior to debridement Wound #8 Right,Midline Lower Leg: Topical Lidocaine 4% cream applied to wound bed prior to debridement Skin Barriers/Peri-Wound Care: Wound #1 Right Back: Skin Prep Wound #8 Right,Midline Lower Leg: Skin Prep Primary Wound Dressing: Wound #1 Right Back: Other: - siltec sorbact Wound #8 Right,Midline Lower Leg: Other: - siltec sorbact Dressing Change Frequency: Wound #1 Right Back: Change dressing every week - unless saturated Wound #8 Right,Midline Lower Leg: Other: - only change if it needs to be Follow-up Appointments: Wound #1 Right Back: Return Appointment in 1 week. I have recommended we use a Siltec Sorbact on both  her back and her right lower extremity and I have asked her to continue to elevate her limb as much as possible. She will come back and see as next week. Electronic Signature(s) Signed: 08/07/2015 4:42:57 PM By: [name], [name] Previous Signature: 08/07/2015 1:42:47 PM Version By: [name], [name] Entered By: [name] on 08/07/2015 16:42:57 Gassen, [name] ([id number]) -------------------------------------------------------------------------------- SuperBill Details Patient Name: [name] Date of Service: 08/07/2015 Medical Record Patient Account Number: [id number] Number: [name], RN, [name], Treating RN: 05/17/1956 (60 y.o. [name] Date of Birth/Sex: Female) Other Clinician: Primary Care Physician: [name] Treating Belkys Henault Referring Physician: [name] Physician/Extender: [name]: 32 Diagnosis Coding ICD-10 Codes Code Description E11.622 Type 2 diabetes mellitus with other skin ulcer L89.112 Pressure ulcer of right upper back, stage 2 S80.821A Blister (nonthermal), right lower leg, initial encounter N18.6 End stage renal disease Physician Procedures CPT4 Code: [id number] Description: [id number] - WC PHYS LEVEL 3 - EST PT ICD-10 Description Diagnosis E11.622 Type 2 diabetes mellitus with other skin ulcer L89.112 Pressure ulcer of right upper back, stage 2 S80.821A Blister (nonthermal), right lower leg, initial enc N18.6 End  stage renal disease Modifier: ounter Quantity: 1 Electronic Signature(s) Signed: 08/07/2015 1:43:02 PM By: [name], [name] Entered By: [name] on 08/07/2015 13:43:01

## 2015-08-14 ENCOUNTER — Encounter: Payer: Medicare Other | Admitting: Surgery

## 2015-08-14 DIAGNOSIS — E11622 Type 2 diabetes mellitus with other skin ulcer: Secondary | ICD-10-CM | POA: Diagnosis not present

## 2015-08-14 NOTE — Progress Notes (Addendum)
ROSHANDRA, MONLEY ([id number]) Visit Report for 08/14/2015 Chief Complaint Document Details [name], [name] 08/14/2015 11:15 Date of Service: AM Medical Record [id number] Number: Patient Account Number: [id number] 09-24-1955 (60 y.o. Treating RN: Montey Hora Date of Birth/Sex: Female) Other Clinician: Primary Care Physician: [name] Treating Benedicto Capozzi Referring Physician: [name] Physician/Extender: [name]: 33 Information Obtained from: Patient Chief Complaint Patient presents to the wound care center for a consult due non healing wound. She has an open wound on her right upper back which she's had for about a year and she recently noticed a blister on her right lower extremity about 2 weeks ago. Electronic Signature(s) Signed: 08/14/2015 11:13:41 AM By: [name], [name] Entered By: [name] on 08/14/2015 11:13:41 Mcconnon, [name] ([id number]) -------------------------------------------------------------------------------- HPI Details Patient Name: [name], [name] 08/14/2015 11:15 Date of Service: AM Medical Record [id number] Number: Patient Account Number: [id number] 08-16-1955 (60 y.o. Treating RN: Montey Hora Date of Birth/Sex: Female) Other Clinician: Primary Care Physician: [name] Treating Divit Stipp Referring Physician: [name] Physician/Extender: [name]: 33 History of Present Illness Location: right upper back and right lower extremity wounds Quality: Patient reports No Pain. Severity: Patient states wound (s) are getting better. Duration: Patient has had the wound for > 3 months prior to seeking treatment at the wound center Timing: she thought it first occurred when she was using a heating pad about a year ago after back surgery. Context: The wound appeared gradually over time Modifying Factors: Patient is currently on renal dialysis and receives treatments 3 times weekly Associated  Signs and Symptoms: Patient reports having: surgery scheduled for this week for a AV fistula left arm. HPI Description: 60 year old patient who is known to be a diabetic and has end-stage renal disease has had several comorbidities including coronary artery disease, hypertension, hyperlipidemia, pancreatitis, anemia, previous history of hysterectomy, cholecystectomy, left-sided salivary gland excision, bilateral cataract surgery,Peritoneal dialysis catheter, hemodialysis catheter. the area on the back has also been caused by instant pressure she used to sleep on a recliner all day and has significant kyphoscoliosis. As far as the wound on her right lower extremity she's not sure how this blister occurred but she thought it has been there for about 2 weeks. No recent blood investigations available and no recent hemoglobin A1c. 12/30/2014 -- she is an assisted living facility but I believe the nurses that have not followed instructions as she had some cream applied on her back and there was a different dressing. Last week she's had a AV fistula placed on her left forearm. 01/13/2015 -- she has had some localized infection at the port site and she's been on doxycycline for this. 02/05/2015 - he has developed a small blister on her right lower extremity. 02/10/2015 -- she has developed another small blister on her right anterior chest wall in the area where she's had tape for her dialysis access. this may just be injury caused by a tape burn. 02/18/2015 -- no new blisters and she had a dermatology opinion and they have taken a biopsy of her skin. She also had a left brachial AV fistula placed this week. 03/03/2015 -- though we do not have the pathology report yet the patient says she has been put on prednisone because this skin disease is possibly to do with her immune system and her dermatologist is recommended this. 03/10/2015 -- she has developed a new blister which is quite large on her right  lower extremity on the shin.  05/22/2015 -- she did very well with her dressing but on trying to remove the [place] it peels away Vertz, Kayline S. ([id number]) the newly healed skin. 06/05/2015 -- the patient was in the ER this past week for severe bleeding from the right nostril and had to have ENT see her and do a packing. They've also been holding her heparin during her dialysis. The patient is going back to ENT today. Other than that she has had no other significant issues. 06/19/2015 -- she is back on her blood thinners and continues to have her hemodialysis as before. 07/31/2015 -- a large blister has popped up again on her right lower extremity in the same place in the mid shin area in the anterior lateral compartment. Electronic Signature(s) Signed: 08/14/2015 11:13:45 AM By: [name], [name] Entered By: [name] on 08/14/2015 11:13:45 Reitano, [name] ([id number]) -------------------------------------------------------------------------------- Physical Exam Details Patient Name: [name], [name] 08/14/2015 11:15 Date of Service: AM Medical Record [id number] Number: Patient Account Number: [id number] April 21, 1956 (60 y.o. Treating RN: Montey Hora Date of Birth/Sex: Female) Other Clinician: Primary Care Physician: [name] Treating Breylan Lefevers Referring Physician: [name] Physician/Extender: [name]: 33 Constitutional . Pulse regular. Respirations normal and unlabored. Afebrile. . Eyes Nonicteric. Reactive to light. Ears, Nose, Mouth, and Throat Lips, teeth, and gums WNL.Marland Kitchen Moist mucosa without lesions. Neck supple and nontender. No palpable supraclavicular or cervical adenopathy. Normal sized without goiter. Respiratory WNL. No retractions.. Breath sounds WNL, No rubs, rales, rhonchi, or wheeze.. Cardiovascular Heart rhythm and rate regular, no murmur or gallop.. Pedal Pulses WNL. No clubbing, cyanosis or edema. Chest Breasts  symmetical and no nipple discharge.. Breast tissue WNL, no masses, lumps, or tenderness.. Lymphatic No adneopathy. No adenopathy. No adenopathy. Musculoskeletal Adexa without tenderness or enlargement.. Digits and nails w/o clubbing, cyanosis, infection, petechiae, ischemia, or inflammatory conditions.. Integumentary (Hair, Skin) No suspicious lesions. No crepitus or fluctuance. No peri-wound warmth or erythema. No masses.Marland Kitchen Psychiatric Judgement and insight Intact.. No evidence of depression, anxiety, or agitation.. Notes the back is looking excellent and there is a very small area open. Her right lower extremity has a larger blister which is ulcerated and she has some surrounding tenderness. Electronic Signature(s) Signed: 08/14/2015 11:36:53 AM By: [name], [name] Previous Signature: 08/14/2015 11:14:37 AM Version By: [name], [name] Entered By: [name] on 08/14/2015 11:36:53 Nipper, [name] ([id number]) -------------------------------------------------------------------------------- Physician Orders Details Patient Name: CORRA, BENCH 08/14/2015 11:15 Date of Service: AM Medical Record [id number] Number: Patient Account Number: [id number] 08-27-1955 (60 y.o. 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 Wound #8 Right,Midline Lower Leg o Clean wound with  Normal Saline. Anesthetic Wound #1 Right Back o Topical Lidocaine 4% cream applied to wound bed prior to debridement Wound #8 Right,Midline Lower Leg o Topical Lidocaine 4% cream applied to wound bed prior to debridement Skin Barriers/Peri-Wound Care Wound #1 Right Back o Skin Prep Wound #8 Right,Midline Lower Leg o Skin Prep Primary  Wound Dressing Wound #1 Right Back o Other: - siltec sorbact Wound #8 Right,Midline Lower Leg o Other: - siltec sorbact Orf, Marriah S. ([id number]) Dressing Change Frequency Wound #1 Right Back o Change dressing every week - unless saturated Wound #8 Right,Midline Lower Leg o Other: - only change if it needs to be Follow-up Appointments Wound #1 Right Back o Return Appointment in 1 week. Electronic Signature(s) Signed: 08/14/2015 2:47:16 PM By: Regan Lemming [name], RN Signed: 08/14/2015 4:48:22 PM By: [name], [name] Entered By: Regan Lemming on 08/14/2015 11:35:47 [place], [name] ([id number]) -------------------------------------------------------------------------------- Problem List Details Patient Name: AZZA, KITCHIN 08/14/2015 11:15 Date of Service: AM Medical Record [id number] Number: Patient Account Number: [id number] 02/17/1956 (60 y.o. Treating RN: Montey Hora Date of Birth/Sex: Female) Other Clinician: Primary Care Physician: [name] Treating Selim Durden Referring Physician: [name] Physician/Extender: [name]: 33 Active Problems ICD-10 Encounter Code Description Active Date Diagnosis E11.622 Type 2 diabetes mellitus with other skin ulcer 12/23/2014 Yes L89.112 Pressure ulcer of right upper back, stage 2 12/23/2014 Yes S80.821A Blister (nonthermal), right lower leg, initial encounter 12/23/2014 Yes N18.6 End stage renal disease 12/23/2014 Yes Inactive Problems Resolved Problems Electronic Signature(s) Signed: 08/14/2015 11:13:16 AM By: [name], [name] Entered By: [name]  on 08/14/2015 11:13:16 Gladwell, [name] ([id number]) -------------------------------------------------------------------------------- Progress Note Details Patient Name: GENSIS, TRAKAS. 08/14/2015 11:15 Date of Service: AM Medical Record [id number] Number: Patient Account Number: [id number] 1956-02-15 (60 y.o. Treating RN: Baruch Gouty, RN, [name], [name] Date of Birth/Sex: Female) Other Clinician: [name], RN, [name], [name] Primary Care Physician: [name] Treating Sung Parodi Referring Physician: [name] Physician/Extender: Suella Grove in Treatment: 29 Subjective Chief Complaint Information obtained from Patient Patient presents to the wound care center for a consult due non healing wound. She has an open wound on her right upper back which she's had for about a year and she recently noticed a blister on her right lower extremity about 2 weeks ago. History of Present Illness (HPI) The following HPI elements were documented for the patient's wound: Location: right upper back and right lower extremity wounds Quality: Patient reports No Pain. Severity: Patient states wound (s) are getting better. Duration: Patient has had the wound for > 3 months prior to seeking treatment at the wound center Timing: she thought it first occurred when she was using a heating pad about a year ago after back surgery. Context: The wound appeared gradually over time Modifying Factors: Patient is currently on renal dialysis and receives treatments 3 times weekly Associated Signs and Symptoms: Patient reports having: surgery scheduled for this week for a AV fistula left arm. 60 year old patient who is known to be a diabetic and has end-stage renal disease has had several comorbidities including coronary artery disease, hypertension, hyperlipidemia, pancreatitis, anemia, previous history of hysterectomy, cholecystectomy, left-sided salivary gland excision, bilateral cataract surgery,Peritoneal dialysis  catheter, hemodialysis catheter. the area on the back has also been caused by instant pressure she used to sleep on a recliner all day and has significant kyphoscoliosis. As far as the wound on her right lower extremity she's not sure how this blister occurred but she thought it has been there for about 2 weeks. No recent blood investigations available and no recent hemoglobin A1c. 12/30/2014 -- she is an assisted living facility but I believe the nurses that have not followed instructions as she had some cream applied on her back and there was a different dressing. Last week she's had a AV fistula placed on  her left forearm. 01/13/2015 -- she has had some localized infection at the port site and she's been on doxycycline for this. 02/05/2015 - he has developed a small blister on her right lower extremity. 02/10/2015 -- she has developed another small blister on her right anterior chest wall in the area where she's had tape for her dialysis access. this may just be injury caused by a tape burn. BRISTYL, HEIDT ([id number]) 02/18/2015 -- no new blisters and she had a dermatology opinion and they have taken a biopsy of her skin. She also had a left brachial AV fistula placed this week. 03/03/2015 -- though we do not have the pathology report yet the patient says she has been put on prednisone because this skin disease is possibly to do with her immune system and her dermatologist is recommended this. 03/10/2015 -- she has developed a new blister which is quite large on her right lower extremity on the shin. 05/22/2015 -- she did very well with her dressing but on trying to remove the [place] it peels away the newly healed skin. 06/05/2015 -- the patient was in the ER this past week for severe bleeding from the right nostril and had to have ENT see her and do a packing. They've also been holding her heparin during her dialysis. The patient is going back to ENT today. Other than that she  has had no other significant issues. 06/19/2015 -- she is back on her blood thinners and continues to have her hemodialysis as before. 07/31/2015 -- a large blister has popped up again on her right lower extremity in the same place in the mid shin area in the anterior lateral compartment. Objective Constitutional Pulse regular. Respirations normal and unlabored. Afebrile. Vitals Time Taken: 11:17 AM, Height: 65 in, Weight: 156 lbs, BMI: 26, Temperature: 98.5 F, Pulse: 82 bpm, Respiratory Rate: 17 breaths/min, Blood Pressure: 202/74 mmHg. Eyes Nonicteric. Reactive to light. Ears, Nose, Mouth, and Throat Lips, teeth, and gums WNL.Marland Kitchen Moist mucosa without lesions. Neck supple and nontender. No palpable supraclavicular or cervical adenopathy. Normal sized without goiter. Respiratory WNL. No retractions.. Breath sounds WNL, No rubs, rales, rhonchi, or wheeze.. Cardiovascular Heart rhythm and rate regular, no murmur or gallop.. Pedal Pulses WNL. No clubbing, cyanosis or edema. Chest Breasts symmetical and no nipple discharge.. Breast tissue WNL, no masses, lumps, or tenderness.[name] ([id number]) Lymphatic No adneopathy. No adenopathy. No adenopathy. Musculoskeletal Adexa without tenderness or enlargement.. Digits and nails w/o clubbing, cyanosis, infection, petechiae, ischemia, or inflammatory conditions.Marland Kitchen Psychiatric Judgement and insight Intact.. No evidence of depression, anxiety, or agitation.. General Notes: the back is looking excellent and there is a very small area open. Her right lower extremity has a larger blister which is ulcerated and she has some surrounding tenderness. Integumentary (Hair, Skin) No suspicious lesions. No crepitus or fluctuance. No peri-wound warmth or erythema. No masses.. Wound #1 status is Open. Original cause of wound was Blister. The wound is located on the Right Back. The wound measures 4cm length x 4cm width x 0.1cm depth; 12.566cm^2 area  and 1.257cm^3 volume. The wound is limited to skin breakdown. There is no tunneling or undermining noted. There is a medium amount of sanguinous drainage noted. The wound margin is indistinct and nonvisible. There is large (67- 100%) red granulation within the wound bed. There is no necrotic tissue within the wound bed. The periwound skin appearance exhibited: Moist. The periwound skin appearance did not exhibit: Callus, Crepitus, Excoriation, Fluctuance, Friable, Induration, Localized  Edema, Rash, Scarring, Dry/Scaly, Maceration, Atrophie Blanche, Cyanosis, Ecchymosis, Hemosiderin Staining, Mottled, Pallor, Rubor, Erythema. Periwound temperature was noted as No Abnormality. Wound #8 status is Open. Original cause of wound was Gradually Appeared. The wound is located on the Right,Midline Lower Leg. The wound measures 0.7cm length x 0.5cm width x 0.1cm depth; 0.275cm^2 area and 0.027cm^3 volume. The wound is limited to skin breakdown. There is no tunneling or undermining noted. There is a none present amount of drainage noted. The wound margin is flat and intact. There is no granulation within the wound bed. There is no necrotic tissue within the wound bed. The periwound skin appearance exhibited: Localized Edema. The periwound skin appearance did not exhibit: Callus, Crepitus, Excoriation, Fluctuance, Friable, Induration, Rash, Scarring, Dry/Scaly, Maceration, Moist, Atrophie Blanche, Cyanosis, Ecchymosis, Hemosiderin Staining, Mottled, Pallor, Rubor, Erythema. Periwound temperature was noted as No Abnormality. The periwound has tenderness on palpation. Assessment Active Problems ICD-10 E11.622 - Type 2 diabetes mellitus with other skin ulcer L89.112 - Pressure ulcer of right upper back, stage 2 S80.821A - Blister (nonthermal), right lower leg, initial encounter N18.6 - End stage renal disease Daughtrey, Charliee S. ([id number]) Plan Wound Cleansing: Wound #1 Right Back: Clean wound with Normal  Saline. Wound #8 Right,Midline Lower Leg: Clean wound with Normal Saline. Anesthetic: Wound #1 Right Back: Topical Lidocaine 4% cream applied to wound bed prior to debridement Wound #8 Right,Midline Lower Leg: Topical Lidocaine 4% cream applied to wound bed prior to debridement Skin Barriers/Peri-Wound Care: Wound #1 Right Back: Skin Prep Wound #8 Right,Midline Lower Leg: Skin Prep Primary Wound Dressing: Wound #1 Right Back: Other: - siltec sorbact Wound #8 Right,Midline Lower Leg: Other: - siltec sorbact Dressing Change Frequency: Wound #1 Right Back: Change dressing every week - unless saturated Wound #8 Right,Midline Lower Leg: Other: - only change if it needs to be Follow-up Appointments: Wound #1 Right Back: Return Appointment in 1 week. I have recommended we use a Siltec Sorbact on both her back and her right lower extremity and I have asked her to continue to elevate her limb as much as possible. She will come back and see as next week. Electronic Signature(s) Signed: 08/14/2015 11:37:07 AM By: [name], [name] Dulude, [name] ([id number]) Entered By: [name] on 08/14/2015 11:37:07 Coonan, [name] ([id number]) -------------------------------------------------------------------------------- SuperBill Details Patient Name: KAYLEAN, RENFROW S. Date of Service: 08/14/2015 Medical Record Patient Account Number: [id number] Number: [name], RN, [name], Treating RN: 1956/06/08 [phone] y.o. [name] Date of Birth/Sex: Female) [name], RN, [name], Other Clinician: Primary Care Physician: Carlynn Spry Referring Physician: [name] Treating Porchea Charrier Physician/Extender: Suella Grove in Treatment: 33 Diagnosis Coding ICD-10 Codes Code Description E11.622 Type 2 diabetes mellitus with other skin ulcer L89.112 Pressure ulcer of right upper back, stage 2 S80.821A Blister (nonthermal), right lower leg, initial encounter N18.6 End stage renal disease Physician  Procedures CPT4 Code: [id number] Description: [id number] - WC PHYS LEVEL 3 - EST PT ICD-10 Description Diagnosis E11.622 Type 2 diabetes mellitus with other skin ulcer L89.112 Pressure ulcer of right upper back, stage 2 S80.821A Blister (nonthermal), right lower leg, initial enc N18.6 End  stage renal disease Modifier: ounter Quantity: 1 Electronic Signature(s) Signed: 08/14/2015 11:37:26 AM By: [name], [name] Entered By: [name] on 08/14/2015 11:37:25

## 2015-08-14 NOTE — Progress Notes (Addendum)
[name], [name] ([id number]) Visit Report for 08/14/2015 Arrival Information Details [name], [name]. Date of Service: 08/14/2015 11:15 AM Medical Record Number: [id number] Patient Account Number: [id number] Date of Birth/Sex: 12/20/1955 (60 y.o. Female) Treating RN: [name], RN, [name], [name] Primary Care Physician: [name] Other Clinician: Referring Physician: Nicky [name] Treating Physician/Extender: Frann Rider in Treatment: 33 Visit Information History Since Last Visit Added or deleted any medications: No Patient Arrived: Walker Any new allergies or adverse reactions: No Arrival Time: 11:16 Had a fall or experienced change in No Accompanied By: self activities of daily living that may affect Transfer Assistance: None risk of falls: Patient Identification Verified: Yes Signs or symptoms of abuse/neglect since last No Secondary Verification Process Yes visito Completed: Hospitalized since last visit: No Patient Requires Transmission- No Has Dressing in Place as Prescribed: Yes Based Precautions: Pain Present Now: No Patient Has Alerts: Yes Patient Alerts: Patient on Blood Thinner Electronic Signature(s) Signed: 08/14/2015 2:47:16 PM By: Regan Lemming [name], RN Entered By: Regan Lemming on 08/14/2015 11:17:30 [name], [name] ([id number]) -------------------------------------------------------------------------------- Clinic Level of Care Assessment Details [name]. Date of Service: 08/14/2015 11:15 AM Medical Record Number: [id number] Patient Account Number: [id number] Date of Birth/Sex: 1955-09-08 (60 y.o. Female) Treating RN: [name], RN, [name], [place] Primary Care Physician: [name] Other Clinician: Baruch Gouty, RN, [name], [name] Referring Physician: Nicky [name] Treating Physician/Extender: Frann Rider in Treatment: 33 Clinic Level of Care Assessment Items TOOL 4 Quantity Score []  - Use when only an EandM is performed on FOLLOW-UP  visit 0 ASSESSMENTS - Nursing Assessment / Reassessment X - Reassessment of Co-morbidities (includes updates in patient status) 1 10 X - Reassessment of Adherence to Treatment Plan 1 5 ASSESSMENTS - Wound and Skin Assessment / Reassessment X - Simple Wound Assessment / Reassessment - one wound 1 5 []  - Complex Wound Assessment / Reassessment - multiple wounds 0 []  - Dermatologic / Skin Assessment (not related to wound area) 0 ASSESSMENTS - Focused Assessment []  - Circumferential Edema Measurements - multi extremities 0 []  - Nutritional Assessment / Counseling / Intervention 0 X - Lower Extremity Assessment (monofilament, tuning fork, pulses) 1 5 []  - Peripheral Arterial Disease Assessment (using hand held doppler) 0 ASSESSMENTS - Ostomy and/or Continence Assessment and Care []  - Incontinence Assessment and Management 0 []  - Ostomy Care Assessment and Management (repouching, etc.) 0 PROCESS - Coordination of Care X - Simple Patient / Family Education for ongoing care 1 15 []  - Complex (extensive) Patient / Family Education for ongoing care 0 []  - Staff obtains Programmer, systems, Records, Test Results / Process Orders 0 []  - Staff telephones HHA, Nursing Homes / Clarify orders / etc 0 []  - Routine Transfer to another Facility (non-emergent condition) 0 [name], [name] ([id number]) []  - Routine Hospital Admission (non-emergent condition) 0 []  - New Admissions / Biomedical engineer / Ordering NPWT, Apligraf, etc. 0 []  - Emergency Hospital Admission (emergent condition) 0 []  - Simple Discharge Coordination 0 []  - Complex (extensive) Discharge Coordination 0 PROCESS - Special Needs []  - Pediatric / Minor Patient Management 0 []  - Isolation Patient Management 0 []  - Hearing / Language / Visual special needs 0 []  - Assessment of Community assistance (transportation, D/C planning, etc.) 0 []  - Additional assistance / Altered mentation 0 []  - Support Surface(s) Assessment (bed, cushion, seat,  etc.) 0 INTERVENTIONS - Wound Cleansing / Measurement []  - Simple Wound Cleansing - one wound 0 X - Complex Wound Cleansing - multiple wounds  2 5 []  - Wound Imaging (photographs - any number of wounds) 0 []  - Wound Tracing (instead of photographs) 0 []  - Simple Wound Measurement - one wound 0 X - Complex Wound Measurement - multiple wounds 2 5 INTERVENTIONS - Wound Dressings X - Small Wound Dressing one or multiple wounds 2 10 []  - Medium Wound Dressing one or multiple wounds 0 []  - Large Wound Dressing one or multiple wounds 0 []  - Application of Medications - topical 0 []  - Application of Medications - injection 0 INTERVENTIONS - Miscellaneous []  - External ear exam 0 [name], [name] ([id number]) []  - Specimen Collection (cultures, biopsies, blood, body fluids, etc.) 0 []  - Specimen(s) / Culture(s) sent or taken to Lab for analysis 0 []  - Patient Transfer (multiple staff / Harrel Lemon Lift / Similar devices) 0 []  - Simple Staple / Suture removal (25 or less) 0 []  - Complex Staple / Suture removal (26 or more) 0 []  - Hypo / Hyperglycemic Management (close monitor of Blood Glucose) 0 []  - Ankle / Brachial Index (ABI) - do not check if billed separately 0 X - Vital Signs 1 5 Has the patient been seen at the hospital within the last three years: Yes Total Score: 85 Level Of Care: New/Established - Level 3 Electronic Signature(s) Signed: 08/14/2015 2:47:16 PM By: Regan Lemming [name], RN Entered By: Regan Lemming on 08/14/2015 11:40:49 [name], [name] ([id number]) -------------------------------------------------------------------------------- Encounter Discharge Information Details [name]. Date of Service: 08/14/2015 11:15 AM Medical Record Number: [id number] Patient Account Number: [id number] Date of Birth/Sex: 05-08-1956 (60 y.o. Female) Treating RN: Baruch Gouty, RN, [name], [name] Primary Care Physician: [name] Other Clinician: Baruch Gouty, RN, [name], [name] Referring Physician: Nicky [name] Treating Physician/Extender: Frann Rider in Treatment: 54 Encounter Discharge Information Items Discharge Pain Level: 0 Discharge Condition: Stable Ambulatory Status: Walker Discharge Destination: Home Transportation: Other Accompanied By: self Schedule Follow-up Appointment: No Medication Reconciliation completed No and provided to Patient/Care Oran Dillenburg: Provided on Clinical Summary of Care: 08/14/2015 Form Type Recipient Paper Patient LD Electronic Signature(s) Signed: 08/14/2015 2:47:16 PM By: Regan Lemming [name], RN Previous Signature: 08/14/2015 11:36:26 AM Version By: Ruthine Dose Entered By: Regan Lemming on 08/14/2015 11:41:45 Stakes, [name] ([id number]) -------------------------------------------------------------------------------- Lower Extremity Assessment Details [name], Wanell S. Date of Service: 08/14/2015 11:15 AM Medical Record Number: [id number] Patient Account Number: [id number] Date of Birth/Sex: Sep 14, 1955 (60 y.o. Female) Treating RN: [name], RN, [name], [name] Primary Care Physician: [name] Other Clinician: Baruch Gouty, RN, [name], [name] Referring Physician: Nicky [name] Treating Physician/Extender: Frann Rider in Treatment: 33 Vascular Assessment Pulses: Posterior Tibial Dorsalis Pedis Palpable: [Right:Yes] Extremity colors, hair growth, and conditions: Extremity Color: [Right:Normal] Hair Growth on Extremity: [Right:Yes] Temperature of Extremity: [Right:Warm] Capillary Refill: [Right:< 3 seconds] Toe Nail Assessment Left: Right: Thick: Yes Discolored: Yes Deformed: No Improper Length and Hygiene: No Electronic Signature(s) Signed: 08/14/2015 2:47:16 PM By: Regan Lemming [name], RN Entered By: Regan Lemming on 08/14/2015 11:19:49 [name], [name] ([id number]) -------------------------------------------------------------------------------- Multi Wound Chart Details [name]. Date of Service: 08/14/2015 11:15  AM Medical Record Number: [id number] Patient Account Number: [id number] Date of Birth/Sex: [birth year] (60 y.o. Female) Treating RN: Baruch Gouty, RN, [name], [name] Primary Care Physician: [name] Other Clinician: Baruch Gouty, RN, [name], [name] Referring Physician: Nicky [name] Treating Physician/Extender: Frann Rider in Treatment: 33 Vital Signs Height(in): 65 Pulse(bpm): 82 Weight(lbs): 156 Blood Pressure 202/74 (mmHg): Body Mass Index(BMI): 26 Temperature(F): 98.5 Respiratory Rate 17 (breaths/min): Photos: [1:No Photos] [8:No Photos] [  N/A:N/A] Wound Location: [1:Right Back] [8:Right Lower Leg - Midline N/A] Wounding Event: [1:Blister] [8:Gradually Appeared] [N/A:N/A] Primary Etiology: [1:Pressure Ulcer] [8:Skin Tear] [N/A:N/A] Comorbid History: [1:Glaucoma, Anemia, Coronary Artery Disease, Coronary Artery Disease, Hypertension, Type II Diabetes, End Stage Renal Disease, Osteoarthritis, Neuropathy Osteoarthritis, Neuropathy] [8:Glaucoma, Anemia, Hypertension, Type II Diabetes,  End Stage Renal Disease,] [N/A:N/A] Date Acquired: [1:10/19/2013] [8:07/27/2015] [N/A:N/A] Weeks of Treatment: [1:33] [8:2] [N/A:N/A] Wound Status: [1:Open] [8:Open] [N/A:N/A] Measurements L x W x D 4x4x0.1 [8:0.7x0.5x0.1] [N/A:N/A] (cm) Area (cm) : [1:12.566] [8:0.275] [N/A:N/A] Volume (cm) : [1:1.257] [8:0.027] [N/A:N/A] % Reduction in Area: [1:67.30%] [8:98.50%] [N/A:N/A] % Reduction in Volume: 67.30% [8:98.60%] [N/A:N/A] Classification: [1:Category/Stage II] [8:Partial Thickness] [N/A:N/A] HBO Classification: [1:N/A] [8:Grade 1] [N/A:N/A] Exudate Amount: [1:Medium] [8:None Present] [N/A:N/A] Exudate Type: [1:Sanguinous] [8:N/A] [N/A:N/A] Exudate Color: [1:red] [8:N/A] [N/A:N/A] Wound Margin: [1:Indistinct, nonvisible] [8:Flat and Intact] [N/A:N/A] Granulation Amount: [1:Large (67-100%)] [8:None Present (0%)] [N/A:N/A] Granulation Quality: [1:Red] [8:N/A] [N/A:N/A] Necrotic Amount: [1:None Present  (0%)] [8:None Present (0%)] [N/A:N/A] Exposed Structures: [1:Fascia: No Fat: No] [8:Fascia: No Fat: No] [N/A:N/A] Tendon: No Tendon: No Muscle: No Muscle: No Joint: No Joint: No Bone: No Bone: No Limited to Skin Limited to Skin Breakdown Breakdown Epithelialization: Medium (34-66%) None N/A Periwound Skin Texture: Edema: No Edema: Yes N/A Excoriation: No Excoriation: No Induration: No Induration: No Callus: No Callus: No Crepitus: No Crepitus: No Fluctuance: No Fluctuance: No Friable: No Friable: No Rash: No Rash: No Scarring: No Scarring: No Periwound Skin Moist: Yes Maceration: No N/A Moisture: Maceration: No Moist: No Dry/Scaly: No Dry/Scaly: No Periwound Skin Color: Atrophie Blanche: No Atrophie Blanche: No N/A Cyanosis: No Cyanosis: No Ecchymosis: No Ecchymosis: No Erythema: No Erythema: No Hemosiderin Staining: No Hemosiderin Staining: No Mottled: No Mottled: No Pallor: No Pallor: No Rubor: No Rubor: No Temperature: No Abnormality No Abnormality N/A Tenderness on No Yes N/A Palpation: Wound Preparation: Ulcer Cleansing: Ulcer Cleansing: N/A Rinsed/Irrigated with Rinsed/Irrigated with Saline Saline Topical Anesthetic Topical Anesthetic Applied: None Applied: None Treatment Notes Electronic Signature(s) Signed: 08/14/2015 2:47:16 PM By: Regan Lemming [name], RN Entered By: Regan Lemming on 08/14/2015 11:27:46 [name], [name] ([id number]) -------------------------------------------------------------------------------- [place] Details [name]. Date of Service: 08/14/2015 11:15 AM Medical Record Number: [id number] Patient Account Number: [id number] Date of Birth/Sex: 12/21/1955 (60 y.o. Female) Treating RN: [name], RN, [name], [name] Primary Care Physician: [name] Other Clinician: Baruch Gouty, RN, [name], [name] Referring Physician: Nicky [name] Treating Physician/Extender: Frann Rider in Treatment:  25 Active Inactive Abuse / Safety / Falls / Self Care Management Nursing Diagnoses: Potential for falls Goals: Patient will remain injury free Date Initiated: 12/23/2014 Goal Status: Active Patient/caregiver will verbalize understanding of skin care regimen Date Initiated: 12/23/2014 Goal Status: Active Patient/caregiver will verbalize/demonstrate measures taken to prevent injury and/or falls Date Initiated: 12/23/2014 Goal Status: Active Patient/caregiver will verbalize/demonstrate understanding of what to do in case of emergency Date Initiated: 12/23/2014 Goal Status: Active Interventions: Assess fall risk on admission and as needed Assess: immobility, friction, shearing, incontinence upon admission and as needed Assess impairment of mobility on admission and as needed per policy Assess self care needs on admission and as needed Provide education on fall prevention Notes: Wound/Skin Impairment Nursing Diagnoses: Impaired tissue integrity Knowledge deficit related to ulceration/compromised skin integrity Goals: Patient/caregiver will verbalize understanding of skin care regimen JANELI, PLUMER ([id number]) Date Initiated: 12/23/2014 Goal Status: Active Ulcer/skin breakdown will heal within 14 weeks Date Initiated: 12/23/2014 Goal Status: Active Interventions: Assess patient/caregiver ability to obtain necessary supplies Assess patient/caregiver  ability to perform ulcer/skin care regimen upon admission and as needed Assess ulceration(s) every visit Provide education on ulcer and skin care Treatment Activities: Skin care regimen initiated : 08/14/2015 Topical wound management initiated : 08/14/2015 Notes: Electronic Signature(s) Signed: 08/14/2015 2:47:16 PM By: Regan Lemming [name], RN Entered By: Regan Lemming on 08/14/2015 11:27:36 [name], [name] ([id number]) -------------------------------------------------------------------------------- Pain Assessment Details [name]. Date of Service: 08/14/2015 11:15 AM Medical Record Number: [id number] Patient Account Number: [id number] Date of Birth/Sex: 1956/01/27 (60 y.o. Female) Treating RN: Baruch Gouty, RN, [name], [name] Primary Care Physician: [name] Other Clinician: Baruch Gouty, RN, [name], [name] Referring Physician: Nicky [name] Treating Physician/Extender: Frann Rider in Treatment: 33 Active Problems Location of Pain Severity and Description of Pain Patient Has Paino No Site Locations Pain Management and Medication Current Pain Management: Electronic Signature(s) Signed: 08/14/2015 2:47:16 PM By: Regan Lemming [name], RN Entered By: Regan Lemming on 08/14/2015 11:17:36 [name], [name] ([id number]) -------------------------------------------------------------------------------- Patient/Caregiver Education Details [name]. Date of Service: 08/14/2015 11:15 AM Medical Record Number: [id number] Patient Account Number: [id number] Date of Birth/Gender: Aug 15, 1955 (60 y.o. Female) Treating RN: Baruch Gouty, RN, [name], [name] Primary Care Physician: [name] Other Clinician: Baruch Gouty, RN, [name], [name] Referring Physician: Nicky [name] Treating Physician/Extender: Frann Rider in Treatment: 53 Education Assessment Education Provided To: Patient Education Topics Provided Safety: Methods: Explain/Verbal Responses: State content correctly Wound/Skin Impairment: Methods: Explain/Verbal Responses: State content correctly Electronic Signature(s) Signed: 08/14/2015 2:47:16 PM By: Regan Lemming [name], RN Entered By: Regan Lemming on 08/14/2015 11:41:58 Patient, [name] ([id number]) -------------------------------------------------------------------------------- Wound Assessment Details Patient Name: [name], Liddie S. Date of Service: 08/14/2015 11:15 AM Medical Record Number: [id number] Patient Account Number: [id number] Date of Birth/Sex: 1956-02-24 (60 y.o. Female) Treating RN: [name], RN, [name],  [place] Primary Care Physician: [name] Other Clinician: Baruch Gouty, RN, [name], [name] Referring Physician: Nicky [name] Treating Physician/Extender: Frann Rider in Treatment: 33 Wound Status Wound Number: 1 Primary Pressure Ulcer Etiology: Wound Location: Right Back Wound Open Wounding Event: Blister Status: Date Acquired: 10/19/2013 Comorbid Glaucoma, Anemia, Coronary Artery Weeks Of Treatment: 33 History: Disease, Hypertension, Type II Clustered Wound: No Diabetes, End Stage Renal Disease, Osteoarthritis, Neuropathy Photos Photo Uploaded By: Regan Lemming on 08/14/2015 13:00:20 Wound Measurements Length: (cm) 4 Width: (cm) 4 Depth: (cm) 0.1 Area: (cm) 12.566 Volume: (cm) 1.257 % Reduction in Area: 67.3% % Reduction in Volume: 67.3% Epithelialization: Medium (34-66%) Tunneling: No Undermining: No Wound Description Classification: Category/Stage II Wound Margin: Indistinct, nonvisible Exudate Amount: Medium Exudate Type: Sanguinous Exudate Color: red Foul Odor After Cleansing: No Wound Bed Granulation Amount: Large (67-100%) Exposed Structure Granulation Quality: Red Fascia Exposed: No Necrotic Amount: None Present (0%) Fat Layer Exposed: No [name], [name] ([id number]) Tendon Exposed: No Muscle Exposed: No Joint Exposed: No Bone Exposed: No Limited to Skin Breakdown Periwound Skin Texture Texture Color No Abnormalities Noted: No No Abnormalities Noted: No Callus: No Atrophie Blanche: No Crepitus: No Cyanosis: No Excoriation: No Ecchymosis: No Fluctuance: No Erythema: No Friable: No Hemosiderin Staining: No Induration: No Mottled: No Localized Edema: No Pallor: No Rash: No Rubor: No Scarring: No Temperature / Pain Moisture Temperature: No Abnormality No Abnormalities Noted: No Dry / Scaly: No Maceration: No Moist: Yes Wound Preparation Ulcer Cleansing: Rinsed/Irrigated with Saline Topical Anesthetic Applied: None Treatment  Notes Wound #1 (Right Back) 1. Cleansed with: Clean wound with Normal Saline 3. Peri-wound Care: Skin Prep 4. Dressing Applied: Other dressing (specify in notes) Notes siltec Sorbact Electronic Signature(s) Signed: 08/14/2015 2:47:16  PM By: Regan Lemming [name], RN Entered By: Regan Lemming on 08/14/2015 11:25:08 [name], [name] ([id number]) -------------------------------------------------------------------------------- Wound Assessment Details Patient Name: LADAWN, DEVITT S. Date of Service: 08/14/2015 11:15 AM Medical Record Number: [id number] Patient Account Number: [id number] Date of Birth/Sex: Sep 14, 1955 (60 y.o. Female) Treating RN: [name], RN, [name], [place] Primary Care Physician: [name] Other Clinician: Baruch Gouty, RN, [name], [name] Referring Physician: Nicky [name] Treating Physician/Extender: Frann Rider in Treatment: 33 Wound Status Wound Number: 8 Primary Skin Tear Etiology: Wound Location: Right Lower Leg - Midline Wound Open Wounding Event: Gradually Appeared Status: Date Acquired: 07/27/2015 Comorbid Glaucoma, Anemia, Coronary Artery Weeks Of Treatment: 2 History: Disease, Hypertension, Type II Clustered Wound: No Diabetes, End Stage Renal Disease, Osteoarthritis, Neuropathy Photos Photo Uploaded By: Regan Lemming on 08/14/2015 13:00:21 Wound Measurements Length: (cm) 0.7 Width: (cm) 0.5 Depth: (cm) 0.1 Area: (cm) 0.275 Volume: (cm) 0.027 % Reduction in Area: 98.5% % Reduction in Volume: 98.6% Epithelialization: None Tunneling: No Undermining: No Wound Description Classification: Partial Thickness Diabetic Severity Earleen [place]): Grade 1 Wound Margin: Flat and Intact Exudate Amount: None Present Wound Bed Granulation Amount: None Present (0%) Exposed Structure Necrotic Amount: None Present (0%) Fascia Exposed: No Fat Layer Exposed: No Tendon Exposed: No Popoff, Latima S. ([id number]) Muscle Exposed: No Joint Exposed: No Bone Exposed: No Limited to Skin  Breakdown Periwound Skin Texture Texture Color No Abnormalities Noted: No No Abnormalities Noted: No Callus: No Atrophie Blanche: No Crepitus: No Cyanosis: No Excoriation: No Ecchymosis: No Fluctuance: No Erythema: No Friable: No Hemosiderin Staining: No Induration: No Mottled: No Localized Edema: Yes Pallor: No Rash: No Rubor: No Scarring: No Temperature / Pain Moisture Temperature: No Abnormality No Abnormalities Noted: No Tenderness on Palpation: Yes Dry / Scaly: No Maceration: No Moist: No Wound Preparation Ulcer Cleansing: Rinsed/Irrigated with Saline Topical Anesthetic Applied: None Treatment Notes Wound #8 (Right, Midline Lower Leg) 1. Cleansed with: Clean wound with Normal Saline 3. Peri-wound Care: Skin Prep 4. Dressing Applied: Other dressing (specify in notes) Notes siltec Sorbact Electronic Signature(s) Signed: 08/14/2015 2:47:16 PM By: Regan Lemming [name], RN Entered By: Regan Lemming on 08/14/2015 11:25:41 [place], [name] ([id number]) -------------------------------------------------------------------------------- [place] Details [name]. Date of Service: 08/14/2015 11:15 AM Medical Record Number: [id number] Patient Account Number: [id number] Date of Birth/Sex: 04-20-1956 (60 y.o. Female) Treating RN: [name], RN, [name], [place] Primary Care Physician: [name] Other Clinician: Baruch Gouty, RN, [name], [name] Referring Physician: Nicky [name] Treating Physician/Extender: Frann Rider in Treatment: 33 Vital Signs Time Taken: 11:17 Temperature (F): 98.5 Height (in): 65 Pulse (bpm): 82 Weight (lbs): 156 Respiratory Rate (breaths/min): 17 Body Mass Index (BMI): 26 Blood Pressure (mmHg): 202/74 Reference Range: 80 - 120 mg / dl Electronic Signature(s) Signed: 08/14/2015 2:47:16 PM By: Regan Lemming [name], RN Entered By: Regan Lemming on 08/14/2015 11:17:57

## 2015-08-21 ENCOUNTER — Encounter: Payer: Medicare Other | Attending: Surgery | Admitting: Surgery

## 2015-08-21 DIAGNOSIS — N186 End stage renal disease: Secondary | ICD-10-CM | POA: Diagnosis not present

## 2015-08-21 DIAGNOSIS — X58XXXA Exposure to other specified factors, initial encounter: Secondary | ICD-10-CM | POA: Insufficient documentation

## 2015-08-21 DIAGNOSIS — I120 Hypertensive chronic kidney disease with stage 5 chronic kidney disease or end stage renal disease: Secondary | ICD-10-CM | POA: Insufficient documentation

## 2015-08-21 DIAGNOSIS — E11622 Type 2 diabetes mellitus with other skin ulcer: Secondary | ICD-10-CM | POA: Insufficient documentation

## 2015-08-21 DIAGNOSIS — L89112 Pressure ulcer of right upper back, stage 2: Secondary | ICD-10-CM | POA: Insufficient documentation

## 2015-08-21 DIAGNOSIS — E1122 Type 2 diabetes mellitus with diabetic chronic kidney disease: Secondary | ICD-10-CM | POA: Insufficient documentation

## 2015-08-21 DIAGNOSIS — Z992 Dependence on renal dialysis: Secondary | ICD-10-CM | POA: Insufficient documentation

## 2015-08-21 DIAGNOSIS — E785 Hyperlipidemia, unspecified: Secondary | ICD-10-CM | POA: Insufficient documentation

## 2015-08-21 DIAGNOSIS — I2510 Atherosclerotic heart disease of native coronary artery without angina pectoris: Secondary | ICD-10-CM | POA: Insufficient documentation

## 2015-08-21 DIAGNOSIS — D649 Anemia, unspecified: Secondary | ICD-10-CM | POA: Diagnosis not present

## 2015-08-21 DIAGNOSIS — S80821A Blister (nonthermal), right lower leg, initial encounter: Secondary | ICD-10-CM | POA: Insufficient documentation

## 2015-08-23 NOTE — Progress Notes (Signed)
SHATARRA, ARK ([id number]) Visit Report for 08/21/2015 Arrival Information Details [name], [name]. Date of Service: 08/21/2015 1:30 PM Medical Record Number: [id number] Patient Account Number: [id number] Date of Birth/Sex: 23-Oct-1955 (60 y.o. Female) Treating RN: Montey Hora Primary Care Physician: [name] Other Clinician: Referring Physician: [name] Treating Physician/Extender: Frann Rider in Treatment: 2 Visit Information History Since Last Visit Added or deleted any medications: No Patient Arrived: Walker Any new allergies or adverse reactions: No Arrival Time: 13:34 Had a fall or experienced change in No Accompanied By: self activities of daily living that may affect Transfer Assistance: None risk of falls: Patient Identification Verified: Yes Signs or symptoms of abuse/neglect since last No Secondary Verification Process Yes visito Completed: Hospitalized since last visit: No Patient Requires Transmission- No Pain Present Now: No Based Precautions: Patient Has Alerts: Yes Patient Alerts: Patient on Blood Thinner Electronic Signature(s) Signed: 08/21/2015 5:19:21 PM By: Montey Hora Entered By: Montey Hora on 08/21/2015 13:37:38 [name] ([id number]) -------------------------------------------------------------------------------- Clinic Level of Care Assessment Details [name]. Date of Service: 08/21/2015 1:30 PM Medical Record Number: [id number] Patient Account Number: [id number] Date of Birth/Sex: [birth year] (60 y.o. Female) Treating RN: Montey Hora Primary Care Physician: [name] Other Clinician: Referring Physician: [name] Treating Physician/Extender: Frann Rider in Treatment: 34 Clinic Level of Care Assessment Items TOOL 4 Quantity Score []  - Use when only an EandM is performed on FOLLOW-UP visit 0 ASSESSMENTS - Nursing Assessment / Reassessment X - Reassessment of  Co-morbidities (includes updates in patient status) 1 10 X - Reassessment of Adherence to Treatment Plan 1 5 ASSESSMENTS - Wound and Skin Assessment / Reassessment []  - Simple Wound Assessment / Reassessment - one wound 0 X - Complex Wound Assessment / Reassessment - multiple wounds 2 5 []  - Dermatologic / Skin Assessment (not related to wound area) 0 ASSESSMENTS - Focused Assessment []  - Circumferential Edema Measurements - multi extremities 0 []  - Nutritional Assessment / Counseling / Intervention 0 X - Lower Extremity Assessment (monofilament, tuning fork, pulses) 1 5 []  - Peripheral Arterial Disease Assessment (using hand held doppler) 0 ASSESSMENTS - Ostomy and/or Continence Assessment and Care []  - Incontinence Assessment and Management 0 []  - Ostomy Care Assessment and Management (repouching, etc.) 0 PROCESS - Coordination of Care X - Simple Patient / Family Education for ongoing care 1 15 []  - Complex (extensive) Patient / Family Education for ongoing care 0 []  - Staff obtains Programmer, systems, Records, Test Results / Process Orders 0 []  - Staff telephones HHA, Nursing Homes / Clarify orders / etc 0 []  - Routine Transfer to another Facility (non-emergent condition) 0 [name], [name] ([id number]) []  - Routine Hospital Admission (non-emergent condition) 0 []  - New Admissions / Biomedical engineer / Ordering NPWT, Apligraf, etc. 0 []  - Emergency Hospital Admission (emergent condition) 0 X - Simple Discharge Coordination 1 10 []  - Complex (extensive) Discharge Coordination 0 PROCESS - Special Needs []  - Pediatric / Minor Patient Management 0 []  - Isolation Patient Management 0 []  - Hearing / Language / Visual special needs 0 []  - Assessment of Community assistance (transportation, D/C planning, etc.) 0 []  - Additional assistance / Altered mentation 0 []  - Support Surface(s) Assessment (bed, cushion, seat, etc.) 0 INTERVENTIONS - Wound Cleansing / Measurement []  - Simple Wound  Cleansing - one wound 0 X - Complex Wound Cleansing - multiple wounds 2 5 X - Wound Imaging (photographs - any number of wounds) 1 5 []  -  Wound Tracing (instead of photographs) 0 []  - Simple Wound Measurement - one wound 0 X - Complex Wound Measurement - multiple wounds 2 5 INTERVENTIONS - Wound Dressings X - Small Wound Dressing one or multiple wounds 2 10 []  - Medium Wound Dressing one or multiple wounds 0 []  - Large Wound Dressing one or multiple wounds 0 []  - Application of Medications - topical 0 []  - Application of Medications - injection 0 INTERVENTIONS - Miscellaneous []  - External ear exam 0 [name], [name] ([id number]) []  - Specimen Collection (cultures, biopsies, blood, body fluids, etc.) 0 []  - Specimen(s) / Culture(s) sent or taken to Lab for analysis 0 []  - Patient Transfer (multiple staff / Harrel Lemon Lift / Similar devices) 0 []  - Simple Staple / Suture removal (25 or less) 0 []  - Complex Staple / Suture removal (26 or more) 0 []  - Hypo / Hyperglycemic Management (close monitor of Blood Glucose) 0 []  - Ankle / Brachial Index (ABI) - do not check if billed separately 0 X - Vital Signs 1 5 Has the patient been seen at the hospital within the last three years: Yes Total Score: 105 Level Of Care: New/Established - Level 3 Electronic Signature(s) Signed: 08/21/2015 2:07:42 PM By: Montey Hora Entered By: Montey Hora on 08/21/2015 14:07:42 [name] ([id number]) -------------------------------------------------------------------------------- Encounter Discharge Information Details Patient Name: [name], [name] Date of Service: 08/21/2015 1:30 PM Medical Record Number: [id number] Patient Account Number: [id number] Date of Birth/Sex: Sep 26, 1955 (60 y.o. Female) Treating RN: Montey Hora Primary Care Physician: [name] Other Clinician: Referring Physician: [name] Treating Physician/Extender: Frann Rider in Treatment: 50 Encounter Discharge  Information Items Discharge Pain Level: 0 Discharge Condition: Stable Ambulatory Status: [name] Discharge Destination: Home Transportation: Private Auto Accompanied By: self Schedule Follow-up Appointment: Yes Medication Reconciliation completed No and provided to Patient/Care Desma Wilkowski: Provided on Clinical Summary of Care: 08/21/2015 Form Type Recipient Paper Patient LD Electronic Signature(s) Signed: 08/21/2015 2:10:25 PM By: Montey Hora Previous Signature: 08/21/2015 2:04:25 PM Version By: Ruthine Dose Entered By: Montey Hora on 08/21/2015 14:10:25 Facchini, [name] ([id number]) -------------------------------------------------------------------------------- Lower Extremity Assessment Details Patient Name: Simonich, Iviona S. Date of Service: 08/21/2015 1:30 PM Medical Record Number: [id number] Patient Account Number: [id number] Date of Birth/Sex: July 04, 1956 (60 y.o. Female) Treating RN: Montey Hora Primary Care Physician: [name] Other Clinician: Referring Physician: [name] Treating Physician/Extender: Frann Rider in Treatment: 34 Vascular Assessment Pulses: Posterior Tibial Dorsalis Pedis Palpable: [Right:Yes] Extremity colors, hair growth, and conditions: Extremity Color: [Right:Hyperpigmented] Hair Growth on Extremity: [Right:No] Temperature of Extremity: [Right:Warm] Capillary Refill: [Right:< 3 seconds] Electronic Signature(s) Signed: 08/21/2015 5:19:21 PM By: Montey Hora Entered By: Montey Hora on 08/21/2015 13:46:53 Kell, Remy S. ([id number]) -------------------------------------------------------------------------------- Multi Wound Chart Details Patient Name: [name], Sybel S. Date of Service: 08/21/2015 1:30 PM Medical Record Number: [id number] Patient Account Number: [id number] Date of Birth/Sex: 1956/06/18 (60 y.o. Female) Treating RN: Montey Hora Primary Care Physician: [name] Other Clinician: Referring  Physician: [name] Treating Physician/Extender: Frann Rider in Treatment: 34 Vital Signs Height(in): 65 Pulse(bpm): 79 Weight(lbs): 156 Blood Pressure 135/60 (mmHg): Body Mass Index(BMI): 26 Temperature(F): 98.2 Respiratory Rate 18 (breaths/min): Photos: [1:No Photos] [8:No Photos] [N/A:N/A] Wound Location: [1:Right Back] [8:Right Lower Leg - Midline N/A] Wounding Event: [1:Blister] [8:Gradually Appeared] [N/A:N/A] Primary Etiology: [1:Pressure Ulcer] [8:Skin Tear] [N/A:N/A] Comorbid History: [1:Glaucoma, Anemia, Coronary Artery Disease, Coronary Artery Disease, Hypertension, Type II Diabetes, End Stage Renal Disease, Osteoarthritis, Neuropathy Osteoarthritis, Neuropathy] [8:Glaucoma, Anemia, Hypertension, Type II Diabetes,  End Stage Renal Disease,] [N/A:N/A] Date Acquired: [1:10/19/2013] [8:07/27/2015] [N/A:N/A] Weeks of Treatment: [1:34] [8:3] [N/A:N/A] Wound Status: [1:Open] [8:Open] [N/A:N/A] Measurements L x W x D 3.2x4.6x0.1 [8:0.1x0.3x0.1] [N/A:N/A] (cm) Area (cm) : [1:11.561] [8:0.024] [N/A:N/A] Volume (cm) : [1:1.156] [8:0.002] [N/A:N/A] % Reduction in Area: [1:70.00%] [8:99.90%] [N/A:N/A] % Reduction in Volume: 70.00% [8:99.90%] [N/A:N/A] Classification: [1:Category/Stage II] [8:Partial Thickness] [N/A:N/A] HBO Classification: [1:N/A] [8:Grade 1] [N/A:N/A] Exudate Amount: [1:Medium] [8:None Present] [N/A:N/A] Exudate Type: [1:Sanguinous] [8:N/A] [N/A:N/A] Exudate Color: [1:red] [8:N/A] [N/A:N/A] Wound Margin: [1:Indistinct, nonvisible] [8:Flat and Intact] [N/A:N/A] Granulation Amount: [1:Large (67-100%)] [8:None Present (0%)] [N/A:N/A] Granulation Quality: [1:Red] [8:N/A] [N/A:N/A] Necrotic Amount: [1:None Present (0%)] [8:None Present (0%)] [N/A:N/A] Exposed Structures: [1:Fascia: No Fat: No] [8:Fascia: No Fat: No] [N/A:N/A] Tendon: No Tendon: No Muscle: No Muscle: No Joint: No Joint: No Bone: No Bone: No Limited to Skin Limited to  Skin Breakdown Breakdown Epithelialization: Medium (34-66%) None N/A Periwound Skin Texture: Edema: No Edema: Yes N/A Excoriation: No Excoriation: No Induration: No Induration: No Callus: No Callus: No Crepitus: No Crepitus: No Fluctuance: No Fluctuance: No Friable: No Friable: No Rash: No Rash: No Scarring: No Scarring: No Periwound Skin Moist: Yes Maceration: No N/A Moisture: Maceration: No Moist: No Dry/Scaly: No Dry/Scaly: No Periwound Skin Color: Atrophie Blanche: No Atrophie Blanche: No N/A Cyanosis: No Cyanosis: No Ecchymosis: No Ecchymosis: No Erythema: No Erythema: No Hemosiderin Staining: No Hemosiderin Staining: No Mottled: No Mottled: No Pallor: No Pallor: No Rubor: No Rubor: No Temperature: No Abnormality No Abnormality N/A Tenderness on No Yes N/A Palpation: Wound Preparation: Ulcer Cleansing: Ulcer Cleansing: N/A Rinsed/Irrigated with Rinsed/Irrigated with Saline Saline Topical Anesthetic Topical Anesthetic Applied: None Applied: None Treatment Notes Electronic Signature(s) Signed: 08/21/2015 5:19:21 PM By: Montey Hora Entered By: Montey Hora on 08/21/2015 13:48:53 Schellenberg, [name] ([id number]) -------------------------------------------------------------------------------- [place] Details [name]. Date of Service: 08/21/2015 1:30 PM Medical Record Number: [id number] Patient Account Number: [id number] Date of Birth/Sex: [birth year] (60 y.o. Female) Treating RN: Montey Hora Primary Care Physician: [name] Other Clinician: Referring Physician: [name] Treating Physician/Extender: Frann Rider in Treatment: 31 Active Inactive Abuse / Safety / Falls / Self Care Management Nursing Diagnoses: Potential for falls Goals: Patient will remain injury free Date Initiated: 12/23/2014 Goal Status: Active Patient/caregiver will verbalize understanding of skin care regimen Date  Initiated: 12/23/2014 Goal Status: Active Patient/caregiver will verbalize/demonstrate measures taken to prevent injury and/or falls Date Initiated: 12/23/2014 Goal Status: Active Patient/caregiver will verbalize/demonstrate understanding of what to do in case of emergency Date Initiated: 12/23/2014 Goal Status: Active Interventions: Assess fall risk on admission and as needed Assess: immobility, friction, shearing, incontinence upon admission and as needed Assess impairment of mobility on admission and as needed per policy Assess self care needs on admission and as needed Provide education on fall prevention Notes: Wound/Skin Impairment Nursing Diagnoses: Impaired tissue integrity Knowledge deficit related to ulceration/compromised skin integrity Goals: Patient/caregiver will verbalize understanding of skin care regimen MAYSEN, BLACKWELDER ([id number]) Date Initiated: 12/23/2014 Goal Status: Active Ulcer/skin breakdown will heal within 14 weeks Date Initiated: 12/23/2014 Goal Status: Active Interventions: Assess patient/caregiver ability to obtain necessary supplies Assess patient/caregiver ability to perform ulcer/skin care regimen upon admission and as needed Assess ulceration(s) every visit Provide education on ulcer and skin care Treatment Activities: Skin care regimen initiated : 08/21/2015 Topical wound management initiated : 08/21/2015 Notes: Electronic Signature(s) Signed: 08/21/2015 5:19:21 PM By: Montey Hora Entered By: Montey Hora on 08/21/2015 13:48:41 Calvi, [name] ([id number]) -------------------------------------------------------------------------------- Patient/Caregiver Education Details  [name], MORASCH. Date of Service: 08/21/2015 1:30 PM Medical Record Number: [id number] Patient Account Number: [id number] Date of Birth/Gender: 06/15/1956 (60 y.o. Female) Treating RN: Montey Hora Primary Care Physician: [name] Other Clinician: Referring  Physician: [name] Treating Physician/Extender: Frann Rider in Treatment: 89 Education Assessment Education Provided To: Patient Education Topics Provided Venous: Handouts: Other: light compression Methods: Demonstration, Explain/Verbal Responses: State content correctly Electronic Signature(s) Signed: 08/21/2015 2:12:21 PM By: Montey Hora Entered By: Montey Hora on 08/21/2015 14:12:21 Berhane, [name] ([id number]) -------------------------------------------------------------------------------- Wound Assessment Details Patient Name: Zeman, [name] Date of Service: 08/21/2015 1:30 PM Medical Record Number: [id number] Patient Account Number: [id number] Date of Birth/Sex: 1956-05-08 (60 y.o. Female) Treating RN: Montey Hora Primary Care Physician: [name] Other Clinician: Referring Physician: [name] Treating Physician/Extender: Frann Rider in Treatment: 34 Wound Status Wound Number: 1 Primary Pressure Ulcer Etiology: Wound Location: Right Back Wound Open Wounding Event: Blister Status: Date Acquired: 10/19/2013 Comorbid Glaucoma, Anemia, Coronary Artery Weeks Of Treatment: 34 History: Disease, Hypertension, Type II Clustered Wound: No Diabetes, End Stage Renal Disease, Osteoarthritis, Neuropathy Photos Photo Uploaded By: Montey Hora on 08/21/2015 17:15:28 Wound Measurements Length: (cm) 3.2 Width: (cm) 4.6 Depth: (cm) 0.1 Area: (cm) 11.561 Volume: (cm) 1.156 % Reduction in Area: 70% % Reduction in Volume: 70% Epithelialization: Medium (34-66%) Tunneling: No Undermining: No Wound Description Classification: Category/Stage II Wound Margin: Indistinct, nonvisible Exudate Amount: Medium Exudate Type: Sanguinous Exudate Color: red Foul Odor After Cleansing: No Wound Bed Granulation Amount: Large (67-100%) Exposed Structure Granulation Quality: Red Fascia Exposed: No Necrotic Amount: None Present (0%) Fat Layer  Exposed: No Biedermann, Veleka S. ([id number]) Tendon Exposed: No Muscle Exposed: No Joint Exposed: No Bone Exposed: No Limited to Skin Breakdown Periwound Skin Texture Texture Color No Abnormalities Noted: No No Abnormalities Noted: No Callus: No Atrophie Blanche: No Crepitus: No Cyanosis: No Excoriation: No Ecchymosis: No Fluctuance: No Erythema: No Friable: No Hemosiderin Staining: No Induration: No Mottled: No Localized Edema: No Pallor: No Rash: No Rubor: No Scarring: No Temperature / Pain Moisture Temperature: No Abnormality No Abnormalities Noted: No Dry / Scaly: No Maceration: No Moist: Yes Wound Preparation Ulcer Cleansing: Rinsed/Irrigated with Saline Topical Anesthetic Applied: None Treatment Notes Wound #1 (Right Back) 1. Cleansed with: Clean wound with Normal Saline 3. Peri-wound Care: Skin Prep 4. Dressing Applied: Other dressing (specify in notes) Notes Contractor Signature(s) Signed: 08/21/2015 5:19:21 PM By: Montey Hora Entered By: Montey Hora on 08/21/2015 13:46:15 [place], [name] ([id number]) -------------------------------------------------------------------------------- Wound Assessment Details Patient Name: Frankowski, [name] Date of Service: 08/21/2015 1:30 PM Medical Record Number: [id number] Patient Account Number: [id number] Date of Birth/Sex: May 14, 1956 (60 y.o. Female) Treating RN: Montey Hora Primary Care Physician: [name] Other Clinician: Referring Physician: [name] Treating Physician/Extender: Frann Rider in Treatment: 34 Wound Status Wound Number: 8 Primary Skin Tear Etiology: Wound Location: Right Lower Leg - Midline Wound Open Wounding Event: Gradually Appeared Status: Date Acquired: 07/27/2015 Comorbid Glaucoma, Anemia, Coronary Artery Weeks Of Treatment: 3 History: Disease, Hypertension, Type II Clustered Wound: No Diabetes, End Stage Renal Disease, Osteoarthritis,  Neuropathy Photos Photo Uploaded By: Montey Hora on 08/21/2015 17:15:29 Wound Measurements Length: (cm) 0.1 Width: (cm) 0.3 Depth: (cm) 0.1 Area: (cm) 0.024 Volume: (cm) 0.002 % Reduction in Area: 99.9% % Reduction in Volume: 99.9% Epithelialization: None Tunneling: No Undermining: No Wound Description Classification: Partial Thickness Diabetic Severity Earleen Newport): Grade 1 Wound Margin: Flat and Intact Exudate Amount: None Present Wound Bed Granulation Amount: None  Present (0%) Exposed Structure Necrotic Amount: None Present (0%) Fascia Exposed: No Fat Layer Exposed: No Tendon Exposed: No Harren, Kerrie S. ([id number]) Muscle Exposed: No Joint Exposed: No Bone Exposed: No Limited to Skin Breakdown Periwound Skin Texture Texture Color No Abnormalities Noted: No No Abnormalities Noted: No Callus: No Atrophie Blanche: No Crepitus: No Cyanosis: No Excoriation: No Ecchymosis: No Fluctuance: No Erythema: No Friable: No Hemosiderin Staining: No Induration: No Mottled: No Localized Edema: Yes Pallor: No Rash: No Rubor: No Scarring: No Temperature / Pain Moisture Temperature: No Abnormality No Abnormalities Noted: No Tenderness on Palpation: Yes Dry / Scaly: No Maceration: No Moist: No Wound Preparation Ulcer Cleansing: Rinsed/Irrigated with Saline Topical Anesthetic Applied: None Treatment Notes Wound #8 (Right, Midline Lower Leg) 1. Cleansed with: Clean wound with Normal Saline 3. Peri-wound Care: Skin Prep 4. Dressing Applied: Mepitel 5. Secondary Dressing Applied Bordered Foam Dressing Electronic Signature(s) Signed: 08/21/2015 5:19:21 PM By: Montey Hora Entered By: Montey Hora on 08/21/2015 13:46:28 Weisbecker, [name] ([id number]) -------------------------------------------------------------------------------- Vitals Details [name]. Date of Service: 08/21/2015 1:30 PM Medical Record Number: [id number] Patient Account  Number: [id number] Date of Birth/Sex: [birth year] (60 y.o. Female) Treating RN: Montey Hora Primary Care Physician: [name] Other Clinician: Referring Physician: [name] Treating Physician/Extender: Frann Rider in Treatment: 34 Vital Signs Time Taken: 13:37 Temperature (F): 98.2 Height (in): 65 Pulse (bpm): 79 Weight (lbs): 156 Respiratory Rate (breaths/min): 18 Body Mass Index (BMI): 26 Blood Pressure (mmHg): 135/60 Reference Range: 80 - 120 mg / dl Electronic Signature(s) Signed: 08/21/2015 5:19:21 PM By: Montey Hora Entered By: Montey Hora on 08/21/2015 13:38:05

## 2015-08-23 NOTE — Progress Notes (Signed)
[name] ([id number]) Visit Report for 08/21/2015 Chief Complaint Document Details [name], [name]. Date of Service: 08/21/2015 1:30 PM Medical Record Number: [id number] Patient Account Number: [id number] Date of Birth/Sex: [birth year] (60 y.o. Female) Treating RN: [name] Primary Care Physician: [name] Other Clinician: Referring Physician: Nicky [name] Treating Physician/Extender: [name] in Treatment: 44 Information Obtained from: Patient Chief Complaint Patient presents to the wound care center for a consult due non healing wound. She has an open wound on her right upper back which she's had for about a year and she recently noticed a blister on her right lower extremity about 2 weeks ago. Electronic Signature(s) Signed: 08/21/2015 1:58:52 PM By: [name] Fudge MD, [name] Entered By: [name] on 08/21/2015 13:58:52 [name], [name] ([id number]) -------------------------------------------------------------------------------- HPI Details [name], [name]. Date of Service: 08/21/2015 1:30 PM Medical Record Number: [id number] Patient Account Number: [id number] Date of Birth/Sex: 01/29/1956 (60 y.o. Female) Treating RN: [name] Primary Care Physician: [name] Other Clinician: Referring Physician: Nicky [name] Treating Physician/Extender: [name] in Treatment: 34 History of Present Illness Location: right upper back and right lower extremity wounds Quality: Patient reports No Pain. Severity: Patient states wound (s) are getting better. Duration: Patient has had the wound for > 3 months prior to seeking treatment at the wound center Timing: she thought it first occurred when she was using a heating pad about a year ago after back surgery. Context: The wound appeared gradually over time Modifying Factors: Patient is currently on renal dialysis and receives treatments 3 times weekly Associated Signs and  Symptoms: Patient reports having: surgery scheduled for this week for a AV fistula left arm. HPI Description: 60 year old patient who is known to be a diabetic and has end-stage renal disease has had several comorbidities including coronary artery disease, hypertension, hyperlipidemia, pancreatitis, anemia, previous history of hysterectomy, cholecystectomy, left-sided salivary gland excision, bilateral cataract surgery,Peritoneal dialysis catheter, hemodialysis catheter. the area on the back has also been caused by instant pressure she used to sleep on a recliner all day and has significant kyphoscoliosis. As far as the wound on her right lower extremity she's not sure how this blister occurred but she thought it has been there for about 2 weeks. No recent blood investigations available and no recent hemoglobin A1c. 12/30/2014 -- she is an assisted living facility but I believe the nurses that have not followed instructions as she had some cream applied on her back and there was a different dressing. Last week she's had a AV fistula placed on her left forearm. 01/13/2015 -- she has had some localized infection at the port site and she's been on doxycycline for this. 02/05/2015 - he has developed a small blister on her right lower extremity. 02/10/2015 -- she has developed another small blister on her right anterior chest wall in the area where she's had tape for her dialysis access. this may just be injury caused by a tape burn. 02/18/2015 -- no new blisters and she had a dermatology opinion and they have taken a biopsy of her skin. She also had a left brachial AV fistula placed this week. 03/03/2015 -- though we do not have the pathology report yet the patient says she has been put on prednisone because this skin disease is possibly to do with her immune system and her dermatologist is recommended this. 03/10/2015 -- she has developed a new blister which is quite large on her right lower  extremity on the shin.  05/22/2015 -- she did very well with her dressing but on trying to remove the [place] it peels away the newly healed skin. [name] ([id number]) 06/05/2015 -- the patient was in the ER this past week for severe bleeding from the right nostril and had to have ENT see her and do a packing. They've also been holding her heparin during her dialysis. The patient is going back to ENT today. Other than that she has had no other significant issues. 06/19/2015 -- she is back on her blood thinners and continues to have her hemodialysis as before. 07/31/2015 -- a large blister has popped up again on her right lower extremity in the same place in the mid shin area in the anterior lateral compartment. Electronic Signature(s) Signed: 08/21/2015 1:59:02 PM By: [name] Fudge MD, [name] Entered By: [name] on 08/21/2015 13:59:02 [name], [name] ([id number]) -------------------------------------------------------------------------------- Physical Exam Details [name], [name] Date of Service: 08/21/2015 1:30 PM Medical Record Number: [id number] Patient Account Number: [id number] Date of Birth/Sex: [birth year] (60 y.o. Female) Treating RN: [name] Primary Care Physician: [name] Other Clinician: Referring Physician: Nicky [name] Treating Physician/Extender: [name] in Treatment: 34 Constitutional . Pulse regular. Respirations normal and unlabored. Afebrile. . Eyes Nonicteric. Reactive to light. Ears, Nose, Mouth, and Throat Lips, teeth, and gums WNL.Marland Kitchen Moist mucosa without lesions. Neck supple and nontender. No palpable supraclavicular or cervical adenopathy. Normal sized without goiter. Respiratory WNL. No retractions.. Breath sounds WNL, No rubs, rales, rhonchi, or wheeze.. Cardiovascular Heart rhythm and rate regular, no murmur or gallop.. Pedal Pulses WNL. No clubbing, cyanosis or edema. Chest Breasts symmetical and no  nipple discharge.. Breast tissue WNL, no masses, lumps, or tenderness.. Gastrointestinal (GI) Abdomen without masses or tenderness.. No liver or spleen enlargement or tenderness.. Genitourinary (GU) No hydrocele, spermatocele, tenderness of the cord, or testicular mass.Marland Kitchen Penis without lesions.Lowella Fairy without lesions. No cystocele, or rectocele. Pelvic support intact, no discharge.Marland Kitchen Urethra without masses, tenderness or scarring.Marland Kitchen Lymphatic No adneopathy. No adenopathy. No adenopathy. Musculoskeletal Adexa without tenderness or enlargement.. Digits and nails w/o clubbing, cyanosis, infection, petechiae, ischemia, or inflammatory conditions.. Integumentary (Hair, Skin) No suspicious lesions. No crepitus or fluctuance. No peri-wound warmth or erythema. No masses.Marland Kitchen Psychiatric Judgement and insight Intact.. No evidence of depression, anxiety, or agitation.. Notes the back which is looking excellent last week and a adherent dressing applied by her nursing home staff and today on reviewing the wound there is much larger ulceration due to injury to the fragile skin. The right lower extremity has almost completely healed except for a couple of small microulcerations [name], [name] ([id number]) Electronic Signature(s) Signed: 08/21/2015 2:00:05 PM By: [name] Fudge MD, [name] Entered By: [name] on 08/21/2015 14:00:04 [place], [name] ([id number]) -------------------------------------------------------------------------------- Physician Orders Details [name]. Date of Service: 08/21/2015 1:30 PM Medical Record Number: [id number] Patient Account Number: [id number] Date of Birth/Sex: May 11, 1956 (60 y.o. Female) Treating RN: [name] Primary Care Physician: [name] Other Clinician: Referring Physician: Nicky [name] Treating Physician/Extender: [name] in Treatment: 20 Verbal / Phone Orders: Yes Clinician: Montey [name] Read Back and Verified:  Yes Diagnosis Coding Wound Cleansing Wound #1 Right Back o Clean wound with Normal Saline. Wound #8 Right,Midline Lower Leg o Clean wound with Normal Saline. Anesthetic Wound #1 Right Back o Topical Lidocaine 4% cream applied to wound bed prior to debridement Wound #8 Right,Midline Lower Leg o Topical Lidocaine 4% cream applied to wound bed prior to debridement Skin  Barriers/Peri-Wound Care Wound #1 Right Back o Skin Prep Wound #8 Right,Midline Lower Leg o Skin Prep Primary Wound Dressing Wound #1 Right Back o Other: - siltec sorbact Wound #8 Right,Midline Lower Leg o Mepitel One Secondary Dressing Wound #8 Right,Midline Lower Leg o Boardered Foam Dressing Dressing Change Frequency Wound #1 Right Back o Change dressing every week - unless saturated [name], [name] ([id number]) Wound #8 Right,Midline Lower Leg o Other: - only change if it needs to be Follow-up Appointments Wound #1 Right Back o Return Appointment in 1 week. Wound #8 Right,Midline Lower Leg o Return Appointment in 1 week. Edema Control Wound #8 Right,Midline Lower Leg o Patient to wear own compression stockings - OK for SNF to order light compression hose for patient: 20-49mmHg Electronic Signature(s) Signed: 08/21/2015 4:33:45 PM By: [name] Fudge MD, [name] Signed: 08/21/2015 5:19:21 PM By: [name] Entered By: [name] on 08/21/2015 13:57:31 [place], [name] ([id number]) -------------------------------------------------------------------------------- Problem List Details Patient Name: Basic, Riot S. Date of Service: 08/21/2015 1:30 PM Medical Record Number: [id number] Patient Account Number: [id number] Date of Birth/Sex: Sep 30, 1955 (60 y.o. Female) Treating RN: [name] Primary Care Physician: [name] Other Clinician: Referring Physician: Nicky [name] Treating Physician/Extender: [name] in Treatment: 38 Active  Problems ICD-10 Encounter Code Description Active Date Diagnosis E11.622 Type 2 diabetes mellitus with other skin ulcer 12/23/2014 Yes L89.112 Pressure ulcer of right upper back, stage 2 12/23/2014 Yes S80.821A Blister (nonthermal), right lower leg, initial encounter 12/23/2014 Yes N18.6 End stage renal disease 12/23/2014 Yes Inactive Problems Resolved Problems Electronic Signature(s) Signed: 08/21/2015 1:58:26 PM By: [name] Fudge MD, [name] Entered By: [name] on 08/21/2015 13:58:26 [place], [name] ([id number]) -------------------------------------------------------------------------------- Progress Note Details Patient Name: [name], [name] Date of Service: 08/21/2015 1:30 PM Medical Record Number: [id number] Patient Account Number: [id number] Date of Birth/Sex: 1955-12-02 (60 y.o. Female) Treating RN: [name] Primary Care Physician: [name] Other Clinician: Referring Physician: Nicky [name] Treating Physician/Extender: [name] in Treatment: 45 Subjective Chief Complaint Information obtained from Patient Patient presents to the wound care center for a consult due non healing wound. She has an open wound on her right upper back which she's had for about a year and she recently noticed a blister on her right lower extremity about 2 weeks ago. History of Present Illness (HPI) The following HPI elements were documented for the patient's wound: Location: right upper back and right lower extremity wounds Quality: Patient reports No Pain. Severity: Patient states wound (s) are getting better. Duration: Patient has had the wound for > 3 months prior to seeking treatment at the wound center Timing: she thought it first occurred when she was using a heating pad about a year ago after back surgery. Context: The wound appeared gradually over time Modifying Factors: Patient is currently on renal dialysis and receives treatments 3 times weekly Associated Signs and  Symptoms: Patient reports having: surgery scheduled for this week for a AV fistula left arm. 60 year old patient who is known to be a diabetic and has end-stage renal disease has had several comorbidities including coronary artery disease, hypertension, hyperlipidemia, pancreatitis, anemia, previous history of hysterectomy, cholecystectomy, left-sided salivary gland excision, bilateral cataract surgery,Peritoneal dialysis catheter, hemodialysis catheter. the area on the back has also been caused by instant pressure she used to sleep on a recliner all day and has significant kyphoscoliosis. As far as the wound on her right lower extremity she's not sure how this blister occurred but she thought it has been there  for about 2 weeks. No recent blood investigations available and no recent hemoglobin A1c. 12/30/2014 -- she is an assisted living facility but I believe the nurses that have not followed instructions as she had some cream applied on her back and there was a different dressing. Last week she's had a AV fistula placed on her left forearm. 01/13/2015 -- she has had some localized infection at the port site and she's been on doxycycline for this. 02/05/2015 - he has developed a small blister on her right lower extremity. 02/10/2015 -- she has developed another small blister on her right anterior chest wall in the area where she's had tape for her dialysis access. this may just be injury caused by a tape burn. 02/18/2015 -- no new blisters and she had a dermatology opinion and they have taken a biopsy of her skin. [place], [name] ([id number]) She also had a left brachial AV fistula placed this week. 03/03/2015 -- though we do not have the pathology report yet the patient says she has been put on prednisone because this skin disease is possibly to do with her immune system and her dermatologist is recommended this. 03/10/2015 -- she has developed a new blister which is quite large on her  right lower extremity on the shin. 05/22/2015 -- she did very well with her dressing but on trying to remove the [place] it peels away the newly healed skin. 06/05/2015 -- the patient was in the ER this past week for severe bleeding from the right nostril and had to have ENT see her and do a packing. They've also been holding her heparin during her dialysis. The patient is going back to ENT today. Other than that she has had no other significant issues. 06/19/2015 -- she is back on her blood thinners and continues to have her hemodialysis as before. 07/31/2015 -- a large blister has popped up again on her right lower extremity in the same place in the mid shin area in the anterior lateral compartment. Objective Constitutional Pulse regular. Respirations normal and unlabored. Afebrile. Vitals Time Taken: 1:37 PM, Height: 65 in, Weight: 156 lbs, BMI: 26, Temperature: 98.2 F, Pulse: 79 bpm, Respiratory Rate: 18 breaths/min, Blood Pressure: 135/60 mmHg. Eyes Nonicteric. Reactive to light. Ears, Nose, Mouth, and Throat Lips, teeth, and gums WNL.Marland Kitchen Moist mucosa without lesions. Neck supple and nontender. No palpable supraclavicular or cervical adenopathy. Normal sized without goiter. Respiratory WNL. No retractions.. Breath sounds WNL, No rubs, rales, rhonchi, or wheeze.. Cardiovascular Heart rhythm and rate regular, no murmur or gallop.. Pedal Pulses WNL. No clubbing, cyanosis or edema. Chest Breasts symmetical and no nipple discharge.. Breast tissue WNL, no masses, lumps, or tenderness.. Gastrointestinal (GI) [name], [name] ([id number]) Abdomen without masses or tenderness.. No liver or spleen enlargement or tenderness.. Genitourinary (GU) No hydrocele, spermatocele, tenderness of the cord, or testicular mass.Marland Kitchen Penis without lesions.Lowella Fairy without lesions. No cystocele, or rectocele. Pelvic support intact, no discharge.Marland Kitchen Urethra without masses, tenderness or  scarring.Marland Kitchen Lymphatic No adneopathy. No adenopathy. No adenopathy. Musculoskeletal Adexa without tenderness or enlargement.. Digits and nails w/o clubbing, cyanosis, infection, petechiae, ischemia, or inflammatory conditions.Marland Kitchen Psychiatric Judgement and insight Intact.. No evidence of depression, anxiety, or agitation.. General Notes: the back which is looking excellent last week and a adherent dressing applied by her nursing home staff and today on reviewing the wound there is much larger ulceration due to injury to the fragile skin. The right lower extremity has almost completely healed except for a couple of  small microulcerations Integumentary (Hair, Skin) No suspicious lesions. No crepitus or fluctuance. No peri-wound warmth or erythema. No masses.. Wound #1 status is Open. Original cause of wound was Blister. The wound is located on the Right Back. The wound measures 3.2cm length x 4.6cm width x 0.1cm depth; 11.561cm^2 area and 1.156cm^3 volume. The wound is limited to skin breakdown. There is no tunneling or undermining noted. There is a medium amount of sanguinous drainage noted. The wound margin is indistinct and nonvisible. There is large (67- 100%) red granulation within the wound bed. There is no necrotic tissue within the wound bed. The periwound skin appearance exhibited: Moist. The periwound skin appearance did not exhibit: Callus, Crepitus, Excoriation, Fluctuance, Friable, Induration, Localized Edema, Rash, Scarring, Dry/Scaly, Maceration, Atrophie Blanche, Cyanosis, Ecchymosis, Hemosiderin Staining, Mottled, Pallor, Rubor, Erythema. Periwound temperature was noted as No Abnormality. Wound #8 status is Open. Original cause of wound was Gradually Appeared. The wound is located on the Right,Midline Lower Leg. The wound measures 0.1cm length x 0.3cm width x 0.1cm depth; 0.024cm^2 area and 0.002cm^3 volume. The wound is limited to skin breakdown. There is no tunneling or  undermining noted. There is a none present amount of drainage noted. The wound margin is flat and intact. There is no granulation within the wound bed. There is no necrotic tissue within the wound bed. The periwound skin appearance exhibited: Localized Edema. The periwound skin appearance did not exhibit: Callus, Crepitus, Excoriation, Fluctuance, Friable, Induration, Rash, Scarring, Dry/Scaly, Maceration, Moist, Atrophie Blanche, Cyanosis, Ecchymosis, Hemosiderin Staining, Mottled, Pallor, Rubor, Erythema. Periwound temperature was noted as No Abnormality. The periwound has tenderness on palpation. [name], [name] ([id number]) Assessment Active Problems ICD-10 E11.622 - Type 2 diabetes mellitus with other skin ulcer L89.112 - Pressure ulcer of right upper back, stage 2 S80.821A - Blister (nonthermal), right lower leg, initial encounter N18.6 - End stage renal disease Plan Wound Cleansing: Wound #1 Right Back: Clean wound with Normal Saline. Wound #8 Right,Midline Lower Leg: Clean wound with Normal Saline. Anesthetic: Wound #1 Right Back: Topical Lidocaine 4% cream applied to wound bed prior to debridement Wound #8 Right,Midline Lower Leg: Topical Lidocaine 4% cream applied to wound bed prior to debridement Skin Barriers/Peri-Wound Care: Wound #1 Right Back: Skin Prep Wound #8 Right,Midline Lower Leg: Skin Prep Primary Wound Dressing: Wound #1 Right Back: Other: - siltec sorbact Wound #8 Right,Midline Lower Leg: Mepitel One Secondary Dressing: Wound #8 Right,Midline Lower Leg: Boardered Foam Dressing Dressing Change Frequency: Wound #1 Right Back: Change dressing every week - unless saturated Wound #8 Right,Midline Lower Leg: Other: - only change if it needs to be Follow-up Appointments: Wound #1 Right Back: Return Appointment in 1 week. Wound #8 Right,Midline Lower Leg: Return Appointment in 1 week. [name] ([id number]) Edema Control: Wound #8 Right,Midline  Lower Leg: Patient to wear own compression stockings - OK for SNF to order light compression hose for patient: 20- 72mmHg I have recommended we use a Siltec Sorbact on her back and bordered foam on her right lower extremity and I have asked her to continue to elevate her limb as much as possible. She will come back and see [place] next week. Electronic Signature(s) Signed: 08/21/2015 2:00:42 PM By: [name] Fudge MD, [name] Entered By: [name] on 08/21/2015 14:00:42 [name], [name] ([id number]) -------------------------------------------------------------------------------- SuperBill Details [name]. Date of Service: 08/21/2015 Medical Record Number: [id number] Patient Account Number: [id number] Date of Birth/Sex: June 07, 1956 (60 y.o. Female) Treating RN: [name] Primary Care Physician: Brynda Greathouse  ERNEST Other Clinician: Referring Physician: Nicky [name] Treating Physician/Extender: [name] in Treatment: 34 Diagnosis Coding ICD-10 Codes Code Description E11.622 Type 2 diabetes mellitus with other skin ulcer L89.112 Pressure ulcer of right upper back, stage 2 S80.821A Blister (nonthermal), right lower leg, initial encounter N18.6 End stage renal disease Facility Procedures CPT4 Code: [id number] Description: 99213 - WOUND CARE VISIT-LEV 3 EST PT Modifier: Quantity: 1 Physician Procedures CPT4 Code: [id number] Description: [id number] - WC PHYS LEVEL 3 - EST PT ICD-10 Description Diagnosis E11.622 Type 2 diabetes mellitus with other skin ulcer L89.112 Pressure ulcer of right upper back, stage 2 S80.821A Blister (nonthermal), right lower leg, initial enc N18.6 End  stage renal disease Modifier: ounter Quantity: 1 Electronic Signature(s) Signed: 08/21/2015 2:07:54 PM By: [name] Signed: 08/21/2015 4:33:45 PM By: [name] Fudge MD, [name] Previous Signature: 08/21/2015 2:01:21 PM Version By: [name] Fudge MD, [name] Entered By: [name] on 08/21/2015 14:07:54

## 2015-08-28 ENCOUNTER — Encounter: Payer: Medicare Other | Admitting: Surgery

## 2015-08-28 DIAGNOSIS — E11622 Type 2 diabetes mellitus with other skin ulcer: Secondary | ICD-10-CM | POA: Diagnosis not present

## 2015-08-29 NOTE — Progress Notes (Signed)
AIANA, THELUSMA ([id number]) Visit Report for 08/28/2015 Chief Complaint Document Details [name], [name]. Date of Service: 08/28/2015 8:00 AM Medical Record Number: [id number] Patient Account Number: [id number] Date of Birth/Sex: 02/11/1956 (60 y.o. Female) Treating RN: Montey Hora Primary Care Physician: [name] Other Clinician: Referring Physician: [name] Treating Physician/Extender: Frann Rider in Treatment: 75 Information Obtained from: Patient Chief Complaint Patient presents to the wound care center for a consult due non healing wound. She has an open wound on her right upper back which she's had for about a year and she recently noticed a blister on her right lower extremity about 2 weeks ago. Electronic Signature(s) Signed: 08/28/2015 8:45:52 AM By: [name], [name] Entered By: [name] on 08/28/2015 08:45:52 Bisig, [name] ([id number]) -------------------------------------------------------------------------------- HPI Details [name], [name]. Date of Service: 08/28/2015 8:00 AM Medical Record Number: [id number] Patient Account Number: [id number] Date of Birth/Sex: December 22, 1955 (60 y.o. Female) Treating RN: Montey Hora Primary Care Physician: [name] Other Clinician: Referring Physician: [name] Treating Physician/Extender: Frann Rider in Treatment: 35 History of Present Illness Location: right upper back and right lower extremity wounds Quality: Patient reports No Pain. Severity: Patient states wound (s) are getting better. Duration: Patient has had the wound for > 3 months prior to seeking treatment at the wound center Timing: she thought it first occurred when she was using a heating pad about a year ago after back surgery. Context: The wound appeared gradually over time Modifying Factors: Patient is currently on renal dialysis and receives treatments 3 times weekly Associated Signs and  Symptoms: Patient reports having: surgery scheduled for this week for a AV fistula left arm. HPI Description: 60 year old patient who is known to be a diabetic and has end-stage renal disease has had several comorbidities including coronary artery disease, hypertension, hyperlipidemia, pancreatitis, anemia, previous history of hysterectomy, cholecystectomy, left-sided salivary gland excision, bilateral cataract surgery,Peritoneal dialysis catheter, hemodialysis catheter. the area on the back has also been caused by instant pressure she used to sleep on a recliner all day and has significant kyphoscoliosis. As far as the wound on her right lower extremity she's not sure how this blister occurred but she thought it has been there for about 2 weeks. No recent blood investigations available and no recent hemoglobin A1c. 12/30/2014 -- she is an assisted living facility but I believe the nurses that have not followed instructions as she had some cream applied on her back and there was a different dressing. Last week she's had a AV fistula placed on her left forearm. 01/13/2015 -- she has had some localized infection at the port site and she's been on doxycycline for this. 02/05/2015 - he has developed a small blister on her right lower extremity. 02/10/2015 -- she has developed another small blister on her right anterior chest wall in the area where she's had tape for her dialysis access. this may just be injury caused by a tape burn. 02/18/2015 -- no new blisters and she had a dermatology opinion and they have taken a biopsy of her skin. She also had a left brachial AV fistula placed this week. 03/03/2015 -- though we do not have the pathology report yet the patient says she has been put on prednisone because this skin disease is possibly to do with her immune system and her dermatologist is recommended this. 03/10/2015 -- she has developed a new blister which is quite large on her right lower  extremity on the shin.  05/22/2015 -- she did very well with her dressing but on trying to remove the [place] it peels away the newly healed skin. [name], [name] ([id number]) 06/05/2015 -- the patient was in the ER this past week for severe bleeding from the right nostril and had to have ENT see her and do a packing. They've also been holding her heparin during her dialysis. The patient is going back to ENT today. Other than that she has had no other significant issues. 06/19/2015 -- she is back on her blood thinners and continues to have her hemodialysis as before. 07/31/2015 -- a large blister has popped up again on her right lower extremity in the same place in the mid shin area in the anterior lateral compartment. Electronic Signature(s) Signed: 08/28/2015 8:45:59 AM By: [name], [name] Entered By: [name] on 08/28/2015 08:45:58 Postlewait, [name] ([id number]) -------------------------------------------------------------------------------- Physical Exam Details [name], 60. Date of Service: 08/28/2015 8:00 AM Medical Record Number: [id number] Patient Account Number: [id number] Date of Birth/Sex: 05-09-1956 (60 y.o. Female) Treating RN: Montey Hora Primary Care Physician: [name] Other Clinician: Referring Physician: [name] Treating Physician/Extender: Frann Rider in Treatment: 35 Constitutional . Pulse regular. Respirations normal and unlabored. Afebrile. . Eyes Nonicteric. Reactive to light. Ears, Nose, Mouth, and Throat Lips, teeth, and gums WNL.Marland Kitchen Moist mucosa without lesions. Neck supple and nontender. No palpable supraclavicular or cervical adenopathy. Normal sized without goiter. Respiratory WNL. No retractions.. Cardiovascular Pedal Pulses WNL. No clubbing, cyanosis or edema. Lymphatic No adneopathy. No adenopathy. No adenopathy. Musculoskeletal Adexa without tenderness or enlargement.. Digits and nails w/o  clubbing, cyanosis, infection, petechiae, ischemia, or inflammatory conditions.. Integumentary (Hair, Skin) No suspicious lesions. No crepitus or fluctuance. No peri-wound warmth or erythema. No masses.Marland Kitchen Psychiatric Judgement and insight Intact.. No evidence of depression, anxiety, or agitation.. Notes the right lateral extruded wound is completely healed. The area on the back is not as good as it was a few weeks ago but there is significant reduction in the size of the main wound with varying ulceration from week to week. Skin still seems to be very fragile. Electronic Signature(s) Signed: 08/28/2015 8:46:44 AM By: [name], [name] Entered By: [name] on 08/28/2015 08:46:44 Donaghy, [name] ([id number]) -------------------------------------------------------------------------------- Physician Orders Details [name]. Date of Service: 08/28/2015 8:00 AM Medical Record Number: [id number] Patient Account Number: [id number] Date of Birth/Sex: 10-07-1955 (60 y.o. Female) Treating RN: Montey Hora Primary Care Physician: [name] Other Clinician: Referring Physician: [name] Treating Physician/Extender: Frann Rider in Treatment: 68 Verbal / Phone Orders: Yes Clinician: Montey Hora Read Back and Verified: Yes Diagnosis Coding Wound Cleansing Wound #1 Right Back o Clean wound with Normal Saline. Anesthetic Wound #1 Right Back o Topical Lidocaine 4% cream applied to wound bed prior to debridement Skin Barriers/Peri-Wound Care Wound #1 Right Back o Skin Prep Primary Wound Dressing Wound #1 Right Back o Other: - siltec sorbact - SNF RN may change dressing if needed with nonadherent pad and bordered foam dressing if siltec sorbact is not available Dressing Change Frequency Wound #1 Right Back o Change dressing every week - unless saturated Follow-up Appointments Wound #1 Right Back o Return Appointment in 1  week. Edema Control o Support Garment 20-30 mm/Hg pressure to: - both lower legs - SNF RN to order these for patient to wear daily and take off at bedtime Electronic Signature(s) Signed: 08/28/2015 3:57:16 PM By: [name], [name] Signed: 08/28/2015 5:06:47 PM  By: Montey Hora Entered By: Montey Hora on 08/28/2015 08:34:16 [name], [name] ([id number]) [name], [name] ([id number]) -------------------------------------------------------------------------------- Problem List Details [name], 46. Date of Service: 08/28/2015 8:00 AM Medical Record Number: [id number] Patient Account Number: [id number] Date of Birth/Sex: 24-Sep-1955 (60 y.o. Female) Treating RN: Montey Hora Primary Care Physician: [name] Other Clinician: Referring Physician: [name] Treating Physician/Extender: Frann Rider in Treatment: 35 Active Problems ICD-10 Encounter Code Description Active Date Diagnosis E11.622 Type 2 diabetes mellitus with other skin ulcer 12/23/2014 Yes L89.112 Pressure ulcer of right upper back, stage 2 12/23/2014 Yes S80.821A Blister (nonthermal), right lower leg, initial encounter 12/23/2014 Yes N18.6 End stage renal disease 12/23/2014 Yes Inactive Problems Resolved Problems Electronic Signature(s) Signed: 08/28/2015 8:45:46 AM By: [name], [name] Entered By: [name] on 08/28/2015 08:45:45 [name], [name] ([id number]) -------------------------------------------------------------------------------- Progress Note Details [name], [name] Date of Service: 08/28/2015 8:00 AM Medical Record Number: [id number] Patient Account Number: [id number] Date of Birth/Sex: October 07, 1955 (60 y.o. Female) Treating RN: Montey Hora Primary Care Physician: [name] Other Clinician: Referring Physician: [name] Treating Physician/Extender: Frann Rider in Treatment: 87 Subjective Chief Complaint Information obtained from  Patient Patient presents to the wound care center for a consult due non healing wound. She has an open wound on her right upper back which she's had for about a year and she recently noticed a blister on her right lower extremity about 2 weeks ago. History of Present Illness (HPI) The following HPI elements were documented for the patient's wound: Location: right upper back and right lower extremity wounds Quality: Patient reports No Pain. Severity: Patient states wound (s) are getting better. Duration: Patient has had the wound for > 3 months prior to seeking treatment at the wound center Timing: she thought it first occurred when she was using a heating pad about a year ago after back surgery. Context: The wound appeared gradually over time Modifying Factors: Patient is currently on renal dialysis and receives treatments 3 times weekly Associated Signs and Symptoms: Patient reports having: surgery scheduled for this week for a AV fistula left arm. 60 year old patient who is known to be a diabetic and has end-stage renal disease has had several comorbidities including coronary artery disease, hypertension, hyperlipidemia, pancreatitis, anemia, previous history of hysterectomy, cholecystectomy, left-sided salivary gland excision, bilateral cataract surgery,Peritoneal dialysis catheter, hemodialysis catheter. the area on the back has also been caused by instant pressure she used to sleep on a recliner all day and has significant kyphoscoliosis. As far as the wound on her right lower extremity she's not sure how this blister occurred but she thought it has been there for about 2 weeks. No recent blood investigations available and no recent hemoglobin A1c. 12/30/2014 -- she is an assisted living facility but I believe the nurses that have not followed instructions as she had some cream applied on her back and there was a different dressing. Last week she's had a AV fistula placed on her left  forearm. 01/13/2015 -- she has had some localized infection at the port site and she's been on doxycycline for this. 02/05/2015 - he has developed a small blister on her right lower extremity. 02/10/2015 -- she has developed another small blister on her right anterior chest wall in the area where she's had tape for her dialysis access. this may just be injury caused by a tape burn. 02/18/2015 -- no new blisters and she had a dermatology opinion and they have taken a  biopsy of her skin. [name], [name] ([id number]) She also had a left brachial AV fistula placed this week. 03/03/2015 -- though we do not have the pathology report yet the patient says she has been put on prednisone because this skin disease is possibly to do with her immune system and her dermatologist is recommended this. 03/10/2015 -- she has developed a new blister which is quite large on her right lower extremity on the shin. 05/22/2015 -- she did very well with her dressing but on trying to remove the [place] it peels away the newly healed skin. 06/05/2015 -- the patient was in the ER this past week for severe bleeding from the right nostril and had to have ENT see her and do a packing. They've also been holding her heparin during her dialysis. The patient is going back to ENT today. Other than that she has had no other significant issues. 06/19/2015 -- she is back on her blood thinners and continues to have her hemodialysis as before. 07/31/2015 -- a large blister has popped up again on her right lower extremity in the same place in the mid shin area in the anterior lateral compartment. Objective Constitutional Pulse regular. Respirations normal and unlabored. Afebrile. Vitals Time Taken: 8:17 AM, Height: 65 in, Weight: 156 lbs, BMI: 26, Temperature: 98.3 F, Pulse: 110 bpm, Respiratory Rate: 18 breaths/min, Blood Pressure: 194/80 mmHg. Eyes Nonicteric. Reactive to light. Ears, Nose, Mouth, and Throat Lips,  teeth, and gums WNL.Marland Kitchen Moist mucosa without lesions. Neck supple and nontender. No palpable supraclavicular or cervical adenopathy. Normal sized without goiter. Respiratory WNL. No retractions.. Cardiovascular Pedal Pulses WNL. No clubbing, cyanosis or edema. Lymphatic No adneopathy. No adenopathy. No adenopathy. Musculoskeletal [name], [name] ([id number]) Adexa without tenderness or enlargement.. Digits and nails w/o clubbing, cyanosis, infection, petechiae, ischemia, or inflammatory conditions.Marland Kitchen Psychiatric Judgement and insight Intact.. No evidence of depression, anxiety, or agitation.. General Notes: the right lateral extruded wound is completely healed. The area on the back is not as good as it was a few weeks ago but there is significant reduction in the size of the main wound with varying ulceration from week to week. Skin still seems to be very fragile. Integumentary (Hair, Skin) No suspicious lesions. No crepitus or fluctuance. No peri-wound warmth or erythema. No masses.. Wound #1 status is Open. Original cause of wound was Blister. The wound is located on the Right Back. The wound measures 3cm length x 2.5cm width x 0.1cm depth; 5.89cm^2 area and 0.589cm^3 volume. The wound is limited to skin breakdown. There is no tunneling or undermining noted. There is a medium amount of sanguinous drainage noted. The wound margin is indistinct and nonvisible. There is large (67- 100%) red granulation within the wound bed. There is no necrotic tissue within the wound bed. The periwound skin appearance exhibited: Moist. The periwound skin appearance did not exhibit: Callus, Crepitus, Excoriation, Fluctuance, Friable, Induration, Localized Edema, Rash, Scarring, Dry/Scaly, Maceration, Atrophie Blanche, Cyanosis, Ecchymosis, Hemosiderin Staining, Mottled, Pallor, Rubor, Erythema. Periwound temperature was noted as No Abnormality. Wound #8 status is Open. Original cause of wound was Gradually  Appeared. The wound is located on the Right,Midline Lower Leg. The wound measures 0cm length x 0cm width x 0cm depth; 0cm^2 area and 0cm^3 volume. Assessment Active Problems ICD-10 E11.622 - Type 2 diabetes mellitus with other skin ulcer L89.112 - Pressure ulcer of right upper back, stage 2 S80.821A - Blister (nonthermal), right lower leg, initial encounter N18.6 - End stage renal disease Piya's  condition is due to a dermatological issue with bullous skin disease which keeps opening out from time to time. She has been fully worked up by dermatology and is on suppressive doses of prednisone. [name] ([id number]) Will continue supportive care with Siltec Sorbact to be Applied as needed to her back and hopefully we can leave it on for 3-4 days before changing. she discussed whether skin graft would be an option and I have told her the reasons why this won't work. Plan Wound Cleansing: Wound #1 Right Back: Clean wound with Normal Saline. Anesthetic: Wound #1 Right Back: Topical Lidocaine 4% cream applied to wound bed prior to debridement Skin Barriers/Peri-Wound Care: Wound #1 Right Back: Skin Prep Primary Wound Dressing: Wound #1 Right Back: Other: - siltec sorbact - SNF RN may change dressing if needed with nonadherent pad and bordered foam dressing if siltec sorbact is not available Dressing Change Frequency: Wound #1 Right Back: Change dressing every week - unless saturated Follow-up Appointments: Wound #1 Right Back: Return Appointment in 1 week. Edema Control: Support Garment 20-30 mm/Hg pressure to: - both lower legs - SNF RN to order these for patient to wear daily and take off at bedtime Rosalia's condition is due to a dermatological issue with bullous skin disease which keeps opening out from time to time. She has been fully worked up by dermatology and is on suppressive doses of prednisone. Will continue supportive care with Siltec Sorbact to be Applied as needed  to her back and hopefully we can leave it on for 3-4 days before changing. she discussed whether skin graft would be an option and I have told her the reasons why this won't work. Electronic Signature(s) Signed: 08/28/2015 8:49:09 AM By: [name], [name] Entered By: [name] on 08/28/2015 08:49:09 Aday, [name] ([id number]) [name], [name] ([id number]) -------------------------------------------------------------------------------- SuperBill Details Patient Name: 35. Date of Service: 08/28/2015 Medical Record Number: [id number] Patient Account Number: [id number] Date of Birth/Sex: 04/14/1956 (60 y.o. Female) Treating RN: Montey Hora Primary Care Physician: [name] Other Clinician: Referring Physician: [name] Treating Physician/Extender: Frann Rider in Treatment: 35 Diagnosis Coding ICD-10 Codes Code Description E11.622 Type 2 diabetes mellitus with other skin ulcer L89.112 Pressure ulcer of right upper back, stage 2 S80.821A Blister (nonthermal), right lower leg, initial encounter N18.6 End stage renal disease Facility Procedures CPT4 Code: [id number] Description: 99213 - WOUND CARE VISIT-LEV 3 EST PT Modifier: Quantity: 1 Physician Procedures CPT4 Code: [id number] Description: [id number] - WC PHYS LEVEL 3 - EST PT ICD-10 Description Diagnosis E11.622 Type 2 diabetes mellitus with other skin ulcer L89.112 Pressure ulcer of right upper back, stage 2 S80.821A Blister (nonthermal), right lower leg, initial enc Modifier: ounter Quantity: 1 Electronic Signature(s) Signed: 08/28/2015 8:50:09 AM By: [name], [name] Entered By: [name] on 08/28/2015 08:50:09

## 2015-08-29 NOTE — Progress Notes (Signed)
JEZABELL, SINYARD ([id number]) Visit Report for 08/28/2015 Arrival Information Details [name], [name]. Date of Service: 08/28/2015 8:00 AM Medical Record Number: [id number] Patient Account Number: [id number] Date of Birth/Sex: Nov 18, 1955 (60 y.o. Female) Treating RN: Montey Hora Primary Care Physician: [name] Other Clinician: Referring Physician: [name] Treating Physician/Extender: Frann Rider in Treatment: 42 Visit Information History Since Last Visit Added or deleted any medications: No Patient Arrived: Walker Any new allergies or adverse reactions: No Arrival Time: 08:16 Had a fall or experienced change in No Accompanied By: self activities of daily living that may affect Transfer Assistance: None risk of falls: Patient Identification Verified: Yes Signs or symptoms of abuse/neglect since last No Secondary Verification Process Yes visito Completed: Hospitalized since last visit: No Patient Requires Transmission- No Pain Present Now: No Based Precautions: Patient Has Alerts: Yes Patient Alerts: Patient on Blood Thinner Electronic Signature(s) Signed: 08/28/2015 5:06:47 PM By: Montey Hora Entered By: Montey Hora on 08/28/2015 08:17:47 [name], [name] ([id number]) -------------------------------------------------------------------------------- Clinic Level of Care Assessment Details [name]. Date of Service: 08/28/2015 8:00 AM Medical Record Number: [id number] Patient Account Number: [id number] Date of Birth/Sex: 1955/10/28 (60 y.o. Female) Treating RN: Montey Hora Primary Care Physician: [name] Other Clinician: Referring Physician: [name] Treating Physician/Extender: Frann Rider in Treatment: 35 Clinic Level of Care Assessment Items TOOL 4 Quantity Score []  - Use when only an EandM is performed on FOLLOW-UP visit 0 ASSESSMENTS - Nursing Assessment / Reassessment X - Reassessment of  Co-morbidities (includes updates in patient status) 1 10 X - Reassessment of Adherence to Treatment Plan 1 5 ASSESSMENTS - Wound and Skin Assessment / Reassessment []  - Simple Wound Assessment / Reassessment - one wound 0 X - Complex Wound Assessment / Reassessment - multiple wounds 2 5 []  - Dermatologic / Skin Assessment (not related to wound area) 0 ASSESSMENTS - Focused Assessment []  - Circumferential Edema Measurements - multi extremities 0 []  - Nutritional Assessment / Counseling / Intervention 0 []  - Lower Extremity Assessment (monofilament, tuning fork, pulses) 0 []  - Peripheral Arterial Disease Assessment (using hand held doppler) 0 ASSESSMENTS - Ostomy and/or Continence Assessment and Care []  - Incontinence Assessment and Management 0 []  - Ostomy Care Assessment and Management (repouching, etc.) 0 PROCESS - Coordination of Care X - Simple Patient / Family Education for ongoing care 1 15 []  - Complex (extensive) Patient / Family Education for ongoing care 0 []  - Staff obtains Programmer, systems, Records, Test Results / Process Orders 0 []  - Staff telephones HHA, Nursing Homes / Clarify orders / etc 0 []  - Routine Transfer to another Facility (non-emergent condition) 0 [name], [name] ([id number]) []  - Routine Hospital Admission (non-emergent condition) 0 []  - New Admissions / Biomedical engineer / Ordering NPWT, Apligraf, etc. 0 []  - Emergency Hospital Admission (emergent condition) 0 X - Simple Discharge Coordination 1 10 []  - Complex (extensive) Discharge Coordination 0 PROCESS - Special Needs []  - Pediatric / Minor Patient Management 0 []  - Isolation Patient Management 0 []  - Hearing / Language / Visual special needs 0 []  - Assessment of Community assistance (transportation, D/C planning, etc.) 0 []  - Additional assistance / Altered mentation 0 []  - Support Surface(s) Assessment (bed, cushion, seat, etc.) 0 INTERVENTIONS - Wound Cleansing / Measurement []  - Simple Wound  Cleansing - one wound 0 X - Complex Wound Cleansing - multiple wounds 1 5 X - Wound Imaging (photographs - any number of wounds) 1 5 []  - Wound  Tracing (instead of photographs) 0 []  - Simple Wound Measurement - one wound 0 X - Complex Wound Measurement - multiple wounds 1 5 INTERVENTIONS - Wound Dressings X - Small Wound Dressing one or multiple wounds 1 10 []  - Medium Wound Dressing one or multiple wounds 0 []  - Large Wound Dressing one or multiple wounds 0 []  - Application of Medications - topical 0 []  - Application of Medications - injection 0 INTERVENTIONS - Miscellaneous []  - External ear exam 0 Yost, [name] ([id number]) []  - Specimen Collection (cultures, biopsies, blood, body fluids, etc.) 0 []  - Specimen(s) / Culture(s) sent or taken to Lab for analysis 0 []  - Patient Transfer (multiple staff / Harrel Lemon Lift / Similar devices) 0 []  - Simple Staple / Suture removal (25 or less) 0 []  - Complex Staple / Suture removal (26 or more) 0 []  - Hypo / Hyperglycemic Management (close monitor of Blood Glucose) 0 []  - Ankle / Brachial Index (ABI) - do not check if billed separately 0 X - Vital Signs 1 5 Has the patient been seen at the hospital within the last three years: Yes Total Score: 80 Level Of Care: New/Established - Level 3 Electronic Signature(s) Signed: 08/28/2015 5:06:47 PM By: Montey Hora Entered By: Montey Hora on 08/28/2015 08:35:17 [place], [name] ([id number]) -------------------------------------------------------------------------------- Encounter Discharge Information Details [name], [name] Date of Service: 08/28/2015 8:00 AM Medical Record Number: [id number] Patient Account Number: [id number] Date of Birth/Sex: 1956/02/17 (60 y.o. Female) Treating RN: Montey Hora Primary Care Physician: [name] Other Clinician: Referring Physician: [name] Treating Physician/Extender: Frann Rider in Treatment: 56 Encounter Discharge  Information Items Discharge Pain Level: 0 Discharge Condition: Stable Ambulatory Status: Walker Discharge Destination: Nursing Home Transportation: Private Auto Accompanied By: self Schedule Follow-up Appointment: Yes Medication Reconciliation completed No and provided to Patient/Care Dyrell Tuccillo: Provided on Clinical Summary of Care: 08/28/2015 Form Type Recipient Paper Patient LD Electronic Signature(s) Signed: 08/28/2015 8:52:33 AM By: Ruthine Dose Entered By: Ruthine Dose on 08/28/2015 08:52:33 [place], [name] ([id number]) -------------------------------------------------------------------------------- Multi Wound Chart Details [name]. Date of Service: 08/28/2015 8:00 AM Medical Record Number: [id number] Patient Account Number: [id number] Date of Birth/Sex: Aug 14, 1955 (60 y.o. Female) Treating RN: Montey Hora Primary Care Physician: [name] Other Clinician: Referring Physician: [name] Treating Physician/Extender: Frann Rider in Treatment: 35 Vital Signs Height(in): 65 Pulse(bpm): 110 Weight(lbs): 156 Blood Pressure 194/80 (mmHg): Body Mass Index(BMI): 26 Temperature(F): 98.3 Respiratory Rate 18 (breaths/min): Photos: [1:No Photos] [8:No Photos] [N/A:N/A] Wound Location: [1:Right Back] [8:Right, Midline Lower Leg] [N/A:N/A] Wounding Event: [1:Blister] [8:Gradually Appeared] [N/A:N/A] Primary Etiology: [1:Pressure Ulcer] [8:Skin Tear] [N/A:N/A] Comorbid History: [1:Glaucoma, Anemia, Coronary Artery Disease, Hypertension, Type II Diabetes, End Stage Renal Disease, Osteoarthritis, Neuropathy] [8:N/A] [N/A:N/A] Date Acquired: [1:10/19/2013] [8:07/27/2015] [N/A:N/A] Weeks of Treatment: [1:35] [8:4] [N/A:N/A] Wound Status: [1:Open] [8:Open] [N/A:N/A] Measurements L x W x D 3x2.5x0.1 [8:0x0x0] [N/A:N/A] (cm) Area (cm) : [1:5.89] [8:0] [N/A:N/A] Volume (cm) : [1:0.589] [8:0] [N/A:N/A] % Reduction in Area: [1:84.70%]  [8:100.00%] [N/A:N/A] % Reduction in Volume: 84.70% [8:100.00%] [N/A:N/A] Classification: [1:Category/Stage II] [8:Partial Thickness] [N/A:N/A] Exudate Amount: [1:Medium] [8:N/A] [N/A:N/A] Exudate Type: [1:Sanguinous] [8:N/A] [N/A:N/A] Exudate Color: [1:red] [8:N/A] [N/A:N/A] Wound Margin: [1:Indistinct, nonvisible] [8:N/A] [N/A:N/A] Granulation Amount: [1:Large (67-100%)] [8:N/A] [N/A:N/A] Granulation Quality: [1:Red] [8:N/A] [N/A:N/A] Necrotic Amount: [1:None Present (0%)] [8:N/A] [N/A:N/A] Exposed Structures: [1:Fascia: No Fat: No Tendon: No] [8:N/A] [N/A:N/A] Muscle: No Joint: No Bone: No Limited to Skin Breakdown Epithelialization: Medium (34-66%) N/A N/A Periwound Skin Texture: Edema: No  No Abnormalities Noted N/A Excoriation: No Induration: No Callus: No Crepitus: No Fluctuance: No Friable: No Rash: No Scarring: No Periwound Skin Moist: Yes No Abnormalities Noted N/A Moisture: Maceration: No Dry/Scaly: No Periwound Skin Color: Atrophie Blanche: No No Abnormalities Noted N/A Cyanosis: No Ecchymosis: No Erythema: No Hemosiderin Staining: No Mottled: No Pallor: No Rubor: No Temperature: No Abnormality N/A N/A Tenderness on No No N/A Palpation: Wound Preparation: Ulcer Cleansing: N/A N/A Rinsed/Irrigated with Saline Topical Anesthetic Applied: None Treatment Notes Electronic Signature(s) Signed: 08/28/2015 5:06:47 PM By: Montey Hora Entered By: Montey Hora on 08/28/2015 08:31:32 [place]les, [name] ([id number]) -------------------------------------------------------------------------------- [name] Details [name]. Date of Service: 08/28/2015 8:00 AM Medical Record Number: [id number] Patient Account Number: [id number] Date of Birth/Sex: 04-Aug-1955 (60 y.o. Female) Treating RN: Montey Hora Primary Care Physician: [name] Other Clinician: Referring Physician: [name] Treating Physician/Extender:  Frann Rider in Treatment: 59 Active Inactive Abuse / Safety / Falls / Self Care Management Nursing Diagnoses: Potential for falls Goals: Patient will remain injury free Date Initiated: 12/23/2014 Goal Status: Active Patient/caregiver will verbalize understanding of skin care regimen Date Initiated: 12/23/2014 Goal Status: Active Patient/caregiver will verbalize/demonstrate measures taken to prevent injury and/or falls Date Initiated: 12/23/2014 Goal Status: Active Patient/caregiver will verbalize/demonstrate understanding of what to do in case of emergency Date Initiated: 12/23/2014 Goal Status: Active Interventions: Assess fall risk on admission and as needed Assess: immobility, friction, shearing, incontinence upon admission and as needed Assess impairment of mobility on admission and as needed per policy Assess self care needs on admission and as needed Provide education on fall prevention Notes: Wound/Skin Impairment Nursing Diagnoses: Impaired tissue integrity Knowledge deficit related to ulceration/compromised skin integrity Goals: Patient/caregiver will verbalize understanding of skin care regimen ONIESHA, LASKO ([id number]) Date Initiated: 12/23/2014 Goal Status: Active Ulcer/skin breakdown will heal within 14 weeks Date Initiated: 12/23/2014 Goal Status: Active Interventions: Assess patient/caregiver ability to obtain necessary supplies Assess patient/caregiver ability to perform ulcer/skin care regimen upon admission and as needed Assess ulceration(s) every visit Provide education on ulcer and skin care Treatment Activities: Skin care regimen initiated : 08/28/2015 Topical wound management initiated : 08/28/2015 Notes: Electronic Signature(s) Signed: 08/28/2015 5:06:47 PM By: Montey Hora Entered By: Montey Hora on 08/28/2015 08:28:33 [name], [name]  ([id number]) -------------------------------------------------------------------------------- Patient/Caregiver Education Details [name]. Date of Service: 08/28/2015 8:00 AM Medical Record Number: [id number] Patient Account Number: [id number] Date of Birth/Gender: 1956/05/14 (60 y.o. Female) Treating RN: Montey Hora Primary Care Physician: [name] Other Clinician: Referring Physician: [name] Treating Physician/Extender: Frann Rider in Treatment: 55 Education Assessment Education Provided To: Patient Education Topics Provided Venous: Handouts: Other: compression daily when available Methods: Explain/Verbal Responses: State content correctly Electronic Signature(s) Signed: 08/28/2015 5:06:47 PM By: Montey Hora Entered By: Montey Hora on 08/28/2015 08:36:19 [place]ff[place], [name] ([id number]) -------------------------------------------------------------------------------- Wound Assessment Details [name], [name] Date of Service: 08/28/2015 8:00 AM Medical Record Number: [id number] Patient Account Number: [id number] Date of Birth/Sex: 21-Jul-1955 (60 y.o. Female) Treating RN: Montey Hora Primary Care Physician: [name] Other Clinician: Referring Physician: [name] Treating Physician/Extender: Frann Rider in Treatment: 35 Wound Status Wound Number: 1 Primary Pressure Ulcer Etiology: Wound Location: Right Back Wound Open Wounding Event: Blister Status: Date Acquired: 10/19/2013 Comorbid Glaucoma, Anemia, Coronary Artery Weeks Of Treatment: 35 History: Disease, Hypertension, Type II Clustered Wound: No Diabetes, End Stage Renal Disease, Osteoarthritis, Neuropathy Photos Photo Uploaded By: Montey Hora on 08/28/2015 16:49:53 Wound Measurements Length: (  cm) 3 Width: (cm) 2.5 Depth: (cm) 0.1 Area: (cm) 5.89 Volume: (cm) 0.589 % Reduction in Area: 84.7% % Reduction in Volume:  84.7% Epithelialization: Medium (34-66%) Tunneling: No Undermining: No Wound Description Classification: Category/Stage II Wound Margin: Indistinct, nonvisible Exudate Amount: Medium Exudate Type: Sanguinous Exudate Color: red Foul Odor After Cleansing: No Wound Bed Granulation Amount: Large (67-100%) Exposed Structure Granulation Quality: Red Fascia Exposed: No Necrotic Amount: None Present (0%) Fat Layer Exposed: No [name], [name] ([id number]) Tendon Exposed: No Muscle Exposed: No Joint Exposed: No Bone Exposed: No Limited to Skin Breakdown Periwound Skin Texture Texture Color No Abnormalities Noted: No No Abnormalities Noted: No Callus: No Atrophie Blanche: No Crepitus: No Cyanosis: No Excoriation: No Ecchymosis: No Fluctuance: No Erythema: No Friable: No Hemosiderin Staining: No Induration: No Mottled: No Localized Edema: No Pallor: No Rash: No Rubor: No Scarring: No Temperature / Pain Moisture Temperature: No Abnormality No Abnormalities Noted: No Dry / Scaly: No Maceration: No Moist: Yes Wound Preparation Ulcer Cleansing: Rinsed/Irrigated with Saline Topical Anesthetic Applied: None Treatment Notes Wound #1 (Right Back) 1. Cleansed with: Clean wound with Normal Saline 2. Anesthetic Topical Lidocaine 4% cream to wound bed prior to debridement 3. Peri-wound Care: Skin Prep 4. Dressing Applied: Other dressing (specify in notes) Notes Contractor Signature(s) Signed: 08/28/2015 5:06:47 PM By: Montey Hora Entered By: Montey Hora on 08/28/2015 08:28:25 [name], [name] ([id number]) -------------------------------------------------------------------------------- Wound Assessment Details [name], Jessice S. Date of Service: 08/28/2015 8:00 AM Medical Record Number: [id number] Patient Account Number: [id number] Date of Birth/Sex: 03-23-1956 (60 y.o. Female) Treating RN: Montey Hora Primary Care Physician: [name] Other Clinician: Referring Physician: [name] Treating Physician/Extender: Frann Rider in Treatment: 35 Wound Status Wound Number: 8 Primary Etiology: Skin Tear Wound Location: Right, Midline Lower Leg Wound Status: Open Wounding Event: Gradually Appeared Date Acquired: 07/27/2015 Weeks Of Treatment: 4 Clustered Wound: No Photos Photo Uploaded By: Montey Hora on 08/28/2015 16:50:06 Wound Measurements Length: (cm) 0 % Reduction Width: (cm) 0 % Reduction Depth: (cm) 0 Area: (cm) 0 Volume: (cm) 0 in Area: 100% in Volume: 100% Wound Description Classification: Partial Thickness Periwound Skin Texture Texture Color No Abnormalities Noted: No No Abnormalities Noted: No Moisture No Abnormalities Noted: No Electronic Signature(s) Signed: 08/28/2015 5:06:47 PM By: Posey Pronto ([id number]) Entered By: Montey Hora on 08/28/2015 08:28:02 Vides, [name] ([id number]) -------------------------------------------------------------------------------- Vitals Details [name], Chosen S. Date of Service: 08/28/2015 8:00 AM Medical Record Number: [id number] Patient Account Number: [id number] Date of Birth/Sex: [birth year] (60 y.o. Female) Treating RN: Montey Hora Primary Care Physician: [name] Other Clinician: Referring Physician: [name] Treating Physician/Extender: Frann Rider in Treatment: 35 Vital Signs Time Taken: 08:17 Temperature (F): 98.3 Height (in): 65 Pulse (bpm): 110 Weight (lbs): 156 Respiratory Rate (breaths/min): 18 Body Mass Index (BMI): 26 Blood Pressure (mmHg): 194/80 Reference Range: 80 - 120 mg / dl Electronic Signature(s) Signed: 08/28/2015 5:06:47 PM By: Montey Hora Entered By: Montey Hora on 08/28/2015 08:19:18

## 2015-09-04 ENCOUNTER — Encounter (HOSPITAL_BASED_OUTPATIENT_CLINIC_OR_DEPARTMENT_OTHER): Payer: Medicare Other | Admitting: General Surgery

## 2015-09-04 DIAGNOSIS — L89112 Pressure ulcer of right upper back, stage 2: Secondary | ICD-10-CM | POA: Diagnosis not present

## 2015-09-04 DIAGNOSIS — E11622 Type 2 diabetes mellitus with other skin ulcer: Secondary | ICD-10-CM | POA: Diagnosis not present

## 2015-09-04 NOTE — Progress Notes (Signed)
seeiheal 

## 2015-09-05 NOTE — Progress Notes (Signed)
[name], [name] ([id number]) Visit Report for 09/04/2015 Arrival Information Details [name], [name]. Date of Service: 09/04/2015 10:00 AM Medical Record Number: [id number] Patient Account Number: [id number] Date of Birth/Sex: August 05, 1955 (60 y.o. Female) Treating RN: [name], RN, [name], [name] Primary Care Physician: [name] Other Clinician: Referring Physician: [name] Treating Physician/Extender: Frann Rider in Treatment: 36 Visit Information History Since Last Visit Added or deleted any medications: No Patient Arrived: Walker Any new allergies or adverse reactions: No Arrival Time: 09:56 Had a fall or experienced change in No Accompanied By: self activities of daily living that may affect Transfer Assistance: None risk of falls: Patient Identification Verified: Yes Signs or symptoms of abuse/neglect since last No Secondary Verification Process Yes visito Completed: Hospitalized since last visit: No Patient Requires Transmission- No Has Dressing in Place as Prescribed: Yes Based Precautions: Pain Present Now: No Patient Has Alerts: Yes Patient Alerts: Patient on Blood Thinner Electronic Signature(s) Signed: 09/04/2015 4:34:04 PM By: Regan Lemming [name], RN Entered By: Regan Lemming on 09/04/2015 09:57:16 Zalesky, [name] ([id number]) -------------------------------------------------------------------------------- Clinic Level of Care Assessment Details [name]. Date of Service: 09/04/2015 10:00 AM Medical Record Number: [id number] Patient Account Number: [id number] Date of Birth/Sex: 03-12-1956 (60 y.o. Female) Treating RN: [name], RN, [name], [name] Primary Care Physician: [name] Other Clinician: Referring Physician: [name] Treating Physician/Extender: Benjaman Pott in Treatment: 36 Clinic Level of Care Assessment Items TOOL 4 Quantity Score []  - Use when only an EandM is performed on FOLLOW-UP visit 0 ASSESSMENTS  - Nursing Assessment / Reassessment X - Reassessment of Co-morbidities (includes updates in patient status) 1 10 X - Reassessment of Adherence to Treatment Plan 1 5 ASSESSMENTS - Wound and Skin Assessment / Reassessment X - Simple Wound Assessment / Reassessment - one wound 1 5 []  - Complex Wound Assessment / Reassessment - multiple wounds 0 []  - Dermatologic / Skin Assessment (not related to wound area) 0 ASSESSMENTS - Focused Assessment []  - Circumferential Edema Measurements - multi extremities 0 []  - Nutritional Assessment / Counseling / Intervention 0 []  - Lower Extremity Assessment (monofilament, tuning fork, pulses) 0 []  - Peripheral Arterial Disease Assessment (using hand held doppler) 0 ASSESSMENTS - Ostomy and/or Continence Assessment and Care []  - Incontinence Assessment and Management 0 []  - Ostomy Care Assessment and Management (repouching, etc.) 0 PROCESS - Coordination of Care X - Simple Patient / Family Education for ongoing care 1 15 []  - Complex (extensive) Patient / Family Education for ongoing care 0 []  - Staff obtains Programmer, systems, Records, Test Results / Process Orders 0 []  - Staff telephones HHA, Nursing Homes / Clarify orders / etc 0 []  - Routine Transfer to another Facility (non-emergent condition) 0 Goede, Paislie S. ([id number]) []  - Routine Hospital Admission (non-emergent condition) 0 []  - New Admissions / Biomedical engineer / Ordering NPWT, Apligraf, etc. 0 []  - Emergency Hospital Admission (emergent condition) 0 []  - Simple Discharge Coordination 0 []  - Complex (extensive) Discharge Coordination 0 PROCESS - Special Needs []  - Pediatric / Minor Patient Management 0 []  - Isolation Patient Management 0 []  - Hearing / Language / Visual special needs 0 []  - Assessment of Community assistance (transportation, D/C planning, etc.) 0 []  - Additional assistance / Altered mentation 0 []  - Support Surface(s) Assessment (bed, cushion, seat, etc.) 0 INTERVENTIONS -  Wound Cleansing / Measurement X - Simple Wound Cleansing - one wound 1 5 []  - Complex Wound Cleansing - multiple wounds 0 X - Wound  Imaging (photographs - any number of wounds) 1 5 []  - Wound Tracing (instead of photographs) 0 X - Simple Wound Measurement - one wound 1 5 []  - Complex Wound Measurement - multiple wounds 0 INTERVENTIONS - Wound Dressings X - Small Wound Dressing one or multiple wounds 1 10 []  - Medium Wound Dressing one or multiple wounds 0 []  - Large Wound Dressing one or multiple wounds 0 []  - Application of Medications - topical 0 []  - Application of Medications - injection 0 INTERVENTIONS - Miscellaneous []  - External ear exam 0 Friedly, Merryl S. ([id number]) []  - Specimen Collection (cultures, biopsies, blood, body fluids, etc.) 0 []  - Specimen(s) / Culture(s) sent or taken to Lab for analysis 0 []  - Patient Transfer (multiple staff / Harrel Lemon Lift / Similar devices) 0 []  - Simple Staple / Suture removal (25 or less) 0 []  - Complex Staple / Suture removal (26 or more) 0 []  - Hypo / Hyperglycemic Management (close monitor of Blood Glucose) 0 []  - Ankle / Brachial Index (ABI) - do not check if billed separately 0 X - Vital Signs 1 5 Has the patient been seen at the hospital within the last three years: Yes Total Score: 65 Level Of Care: New/Established - Level 2 Electronic Signature(s) Signed: 09/04/2015 4:34:04 PM By: Regan Lemming [name], RN Entered By: Regan Lemming on 09/04/2015 10:09:36 Grissinger, [name] ([id number]) -------------------------------------------------------------------------------- Encounter Discharge Information Details [name]. Date of Service: 09/04/2015 10:00 AM Medical Record Number: [id number] Patient Account Number: [id number] Date of Birth/Sex: [birth year] (60 y.o. Female) Treating RN: Baruch Gouty, RN, [name], [name] Primary Care Physician: [name] Other Clinician: Referring Physician: [name] Treating Physician/Extender: Benjaman Pott in Treatment: 36 Encounter Discharge Information Items Discharge Pain Level: 0 Discharge Condition: Stable Ambulatory Status: Walker Discharge Destination: Home Transportation: Other Accompanied By: self Schedule Follow-up Appointment: No Medication Reconciliation completed No and provided to Patient/Care Draya Felker: Provided on Clinical Summary of Care: 09/04/2015 Form Type Recipient Paper Patient LD Electronic Signature(s) Signed: 09/04/2015 12:42:46 PM By: Judene Companion MD Previous Signature: 09/04/2015 10:09:32 AM Version By: Ruthine Dose Entered By: Judene Companion on 09/04/2015 10:56:55 Brook, [name] ([id number]) -------------------------------------------------------------------------------- Multi Wound Chart Details [name]. Date of Service: 09/04/2015 10:00 AM Medical Record Number: [id number] Patient Account Number: [id number] Date of Birth/Sex: [birth year] (60 y.o. Female) Treating RN: Baruch Gouty, RN, [name], [name] Primary Care Physician: [name] Other Clinician: Referring Physician: [name] Treating Physician/Extender: Benjaman Pott in Treatment: 36 Vital Signs Height(in): 65 Pulse(bpm): 82 Weight(lbs): 156 Blood Pressure 178/71 (mmHg): Body Mass Index(BMI): 26 Temperature(F): 98.1 Respiratory Rate 18 (breaths/min): Photos: [1:No Photos] [N/A:N/A] Wound Location: [1:Right Back] [N/A:N/A] Wounding Event: [1:Blister] [N/A:N/A] Primary Etiology: [1:Pressure Ulcer] [N/A:N/A] Comorbid History: [1:Glaucoma, Anemia, Coronary Artery Disease, Hypertension, Type II Diabetes, End Stage Renal Disease, Osteoarthritis, Neuropathy] [N/A:N/A] Date Acquired: [1:10/19/2013] [N/A:N/A] Weeks of Treatment: [1:36] [N/A:N/A] Wound Status: [1:Open] [N/A:N/A] Measurements L x W x D 4x2.5x0.1 [N/A:N/A] (cm) Area (cm) : [1:7.854] [N/A:N/A] Volume (cm) : [1:0.785] [N/A:N/A] % Reduction in Area: [1:79.60%] [N/A:N/A] % Reduction in Volume:  79.60% [N/A:N/A] Classification: [1:Category/Stage II] [N/A:N/A] Exudate Amount: [1:Medium] [N/A:N/A] Exudate Type: [1:Sanguinous] [N/A:N/A] Exudate Color: [1:red] [N/A:N/A] Wound Margin: [1:Indistinct, nonvisible] [N/A:N/A] Granulation Amount: [1:Large (67-100%)] [N/A:N/A] Granulation Quality: [1:Red] [N/A:N/A] Necrotic Amount: [1:None Present (0%)] [N/A:N/A] Exposed Structures: [1:Fascia: No Fat: No Tendon: No] [N/A:N/A] Muscle: No Joint: No Bone: No Limited to Skin Breakdown Epithelialization: Medium (34-66%) N/A N/A Periwound Skin Texture: Edema: No N/A N/A Excoriation:  No Induration: No Callus: No Crepitus: No Fluctuance: No Friable: No Rash: No Scarring: No Periwound Skin Moist: Yes N/A N/A Moisture: Maceration: No Dry/Scaly: No Periwound Skin Color: Atrophie Blanche: No N/A N/A Cyanosis: No Ecchymosis: No Erythema: No Hemosiderin Staining: No Mottled: No Pallor: No Rubor: No Temperature: No Abnormality N/A N/A Tenderness on No N/A N/A Palpation: Wound Preparation: Ulcer Cleansing: N/A N/A Rinsed/Irrigated with Saline Topical Anesthetic Applied: None Treatment Notes Electronic Signature(s) Signed: 09/04/2015 4:34:04 PM By: Regan Lemming [name], RN Entered By: Regan Lemming on 09/04/2015 10:03:38 Cerutti, [name] ([id number]) -------------------------------------------------------------------------------- [place] Details [name]. Date of Service: 09/04/2015 10:00 AM Medical Record Number: [id number] Patient Account Number: [id number] Date of Birth/Sex: April 23, 1956 (60 y.o. Female) Treating RN: [name], RN, [name], [name] Primary Care Physician: [name] Other Clinician: Referring Physician: [name] Treating Physician/Extender: Benjaman Pott in Treatment: 33 Active Inactive Abuse / Safety / Falls / Self Care Management Nursing Diagnoses: Potential for falls Goals: Patient will remain injury free Date  Initiated: 12/23/2014 Goal Status: Active Patient/caregiver will verbalize understanding of skin care regimen Date Initiated: 12/23/2014 Goal Status: Active Patient/caregiver will verbalize/demonstrate measures taken to prevent injury and/or falls Date Initiated: 12/23/2014 Goal Status: Active Patient/caregiver will verbalize/demonstrate understanding of what to do in case of emergency Date Initiated: 12/23/2014 Goal Status: Active Interventions: Assess fall risk on admission and as needed Assess: immobility, friction, shearing, incontinence upon admission and as needed Assess impairment of mobility on admission and as needed per policy Assess self care needs on admission and as needed Provide education on fall prevention Notes: Wound/Skin Impairment Nursing Diagnoses: Impaired tissue integrity Knowledge deficit related to ulceration/compromised skin integrity Goals: Patient/caregiver will verbalize understanding of skin care regimen MISTEY, FIMBRES ([id number]) Date Initiated: 12/23/2014 Goal Status: Active Ulcer/skin breakdown will heal within 14 weeks Date Initiated: 12/23/2014 Goal Status: Active Interventions: Assess patient/caregiver ability to obtain necessary supplies Assess patient/caregiver ability to perform ulcer/skin care regimen upon admission and as needed Assess ulceration(s) every visit Provide education on ulcer and skin care Treatment Activities: Skin care regimen initiated : 09/04/2015 Topical wound management initiated : 09/04/2015 Notes: Electronic Signature(s) Signed: 09/04/2015 4:34:04 PM By: Regan Lemming [name], RN Entered By: Regan Lemming on 09/04/2015 10:03:30 Koons, [name] ([id number]) -------------------------------------------------------------------------------- Pain Assessment Details [name]. Date of Service: 09/04/2015 10:00 AM Medical Record Number: [id number] Patient Account Number: [id number] Date of Birth/Sex: 12/24/1955 (60 y.o.  Female) Treating RN: Baruch Gouty, RN, [name], [name] Primary Care Physician: [name] Other Clinician: Referring Physician: [name] Treating Physician/Extender: Frann Rider in Treatment: 36 Active Problems Location of Pain Severity and Description of Pain Patient Has Paino No Site Locations Pain Management and Medication Current Pain Management: Electronic Signature(s) Signed: 09/04/2015 4:34:04 PM By: Regan Lemming [name], RN Entered By: Regan Lemming on 09/04/2015 09:57:24 Felter, [name] ([id number]) -------------------------------------------------------------------------------- Patient/Caregiver Education Details [name]. Date of Service: 09/04/2015 10:00 AM Medical Record Number: [id number] Patient Account Number: [id number] Date of Birth/Gender: [birth year] (60 y.o. Female) Treating RN: Baruch Gouty, RN, [name], [name] Primary Care Physician: [name] Other Clinician: Referring Physician: [name] Treating Physician/Extender: Benjaman Pott in Treatment: 52 Education Assessment Education Provided To: Patient Education Topics Provided Safety: Methods: Explain/Verbal Responses: State content correctly Wound/Skin Impairment: Methods: Explain/Verbal Responses: State content correctly Electronic Signature(s) Signed: 09/04/2015 12:42:46 PM By: Judene Companion MD Entered By: Judene Companion on 09/04/2015 10:57:04 Hillery, [name] ([id number]) -------------------------------------------------------------------------------- Wound Assessment Details [name].  Date of Service: 09/04/2015 10:00 AM Medical Record Number: [id number] Patient Account Number: [id number] Date of Birth/Sex: 17-Jan-1956 (60 y.o. Female) Treating RN: [name], RN, [name], [name] Primary Care Physician: [name] Other Clinician: Referring Physician: [name] Treating Physician/Extender: Benjaman Pott in Treatment: 36 Wound Status Wound Number: 1 Primary  Pressure Ulcer Etiology: Wound Location: Right Back Wound Open Wounding Event: Blister Status: Date Acquired: 10/19/2013 Comorbid Glaucoma, Anemia, Coronary Artery Weeks Of Treatment: 36 History: Disease, Hypertension, Type II Clustered Wound: No Diabetes, End Stage Renal Disease, Osteoarthritis, Neuropathy Photos Photo Uploaded By: Regan Lemming on 09/04/2015 14:42:30 Wound Measurements Length: (cm) 4 Width: (cm) 2.5 Depth: (cm) 0.1 Area: (cm) 7.854 Volume: (cm) 0.785 % Reduction in Area: 79.6% % Reduction in Volume: 79.6% Epithelialization: Medium (34-66%) Tunneling: No Undermining: No Wound Description Classification: Category/Stage II Wound Margin: Indistinct, nonvisible Exudate Amount: Medium Alligood, Lalaine S. ([id number]) Foul Odor After Cleansing: No Exudate Type: Sanguinous Exudate Color: red Wound Bed Granulation Amount: Large (67-100%) Exposed Structure Granulation Quality: Red Fascia Exposed: No Necrotic Amount: None Present (0%) Fat Layer Exposed: No Tendon Exposed: No Muscle Exposed: No Joint Exposed: No Bone Exposed: No Limited to Skin Breakdown Periwound Skin Texture Texture Color No Abnormalities Noted: No No Abnormalities Noted: No Callus: No Atrophie Blanche: No Crepitus: No Cyanosis: No Excoriation: No Ecchymosis: No Fluctuance: No Erythema: No Friable: No Hemosiderin Staining: No Induration: No Mottled: No Localized Edema: No Pallor: No Rash: No Rubor: No Scarring: No Temperature / Pain Moisture Temperature: No Abnormality No Abnormalities Noted: No Dry / Scaly: No Maceration: No Moist: Yes Wound Preparation Ulcer Cleansing: Rinsed/Irrigated with Saline Topical Anesthetic Applied: None Treatment Notes Wound #1 (Right Back) 1. Cleansed with: Clean wound with Normal Saline 4. Dressing Applied: Prisma Ag 5. Secondary Dressing Applied Bordered Foam Dressing Electronic Signature(s) Signed: 09/04/2015 4:34:04 PM By: Regan Lemming [name], RN Hineman, [name] ([id number]) Entered By: Regan Lemming on 09/04/2015 10:01:57 Blasdel, [name] ([id number]) -------------------------------------------------------------------------------- [place] Details [name]. Date of Service: 09/04/2015 10:00 AM Medical Record Number: [id number] Patient Account Number: [id number] Date of Birth/Sex: April 07, 1956 (60 y.o. Female) Treating RN: [name], RN, [name], [name] Primary Care Physician: [name] Other Clinician: Referring Physician: [name] Treating Physician/Extender: Frann Rider in Treatment: 36 Vital Signs Time Taken: 09:57 Temperature (F): 98.1 Height (in): 65 Pulse (bpm): 82 Weight (lbs): 156 Respiratory Rate (breaths/min): 18 Body Mass Index (BMI): 26 Blood Pressure (mmHg): 178/71 Reference Range: 80 - 120 mg / dl Electronic Signature(s) Signed: 09/04/2015 4:34:04 PM By: Regan Lemming [name], RN Entered By: Regan Lemming on 09/04/2015 [id number]

## 2015-09-05 NOTE — Progress Notes (Signed)
[name], [name] ([id number]) Visit Report for 09/04/2015 Chief Complaint Document Details [name], [name] 09/04/2015 10:00 Date of Service: AM Medical Record [id number] Number: Patient Account Number: [id number] [birth year] (60 y.o. Treating RN: Baruch Gouty, RN, [name], [name] Date of Birth/Sex: Female) Other Clinician: Primary Care Physician: Nena Alexander, Anginette Espejo Referring Physician: [name] Physician/Extender: [name]: 36 Information Obtained from: Patient Chief Complaint Patient presents to the wound care center for a consult due non healing wound. She has an open wound on her right upper back which she's had for about a year and she recently noticed a blister on her right lower extremity about 2 weeks ago. Electronic Signature(s) Signed: 09/04/2015 12:42:46 PM By: Judene Companion MD Entered By: Judene Companion on 09/04/2015 10:54:00 Parthasarathy, [name] ([id number]) -------------------------------------------------------------------------------- HPI Details [name], [name] 09/04/2015 10:00 Date of Service: AM Medical Record [id number] Number: Patient Account Number: [id number] 1956/03/22 (60 y.o. Treating RN: Baruch Gouty, RN, [name], [name] Date of Birth/Sex: Female) Other Clinician: Primary Care Physician: [name] Treating Jerline Pain, Mamie Diiorio Referring Physician: [name] Physician/Extender: [name]: 36 History of Present Illness Location: right upper back and right lower extremity wounds Quality: Patient reports No Pain. Severity: Patient states wound (s) are getting better. Duration: Patient has had the wound for > 3 months prior to seeking treatment at the wound center Timing: she thought it first occurred when she was using a heating pad about a year ago after back surgery. Context: The wound appeared gradually over time Modifying Factors: Patient is currently on renal dialysis and receives treatments 3 times  weekly Associated Signs and Symptoms: Patient reports having: surgery scheduled for this week for a AV fistula left arm. HPI Description: 60 year old patient who is known to be a diabetic and has end-stage renal disease has had several comorbidities including coronary artery disease, hypertension, hyperlipidemia, pancreatitis, anemia, previous history of hysterectomy, cholecystectomy, left-sided salivary gland excision, bilateral cataract surgery,Peritoneal dialysis catheter, hemodialysis catheter. the area on the back has also been caused by instant pressure she used to sleep on a recliner all day and has significant kyphoscoliosis. As far as the wound on her right lower extremity she's not sure how this blister occurred but she thought it has been there for about 2 weeks. No recent blood investigations available and no recent hemoglobin A1c. 12/30/2014 -- she is an assisted living facility but I believe the nurses that have not followed instructions as she had some cream applied on her back and there was a different dressing. Last week she's had a AV fistula placed on her left forearm. 01/13/2015 -- she has had some localized infection at the port site and she's been on doxycycline for this. 02/05/2015 - he has developed a small blister on her right lower extremity. 02/10/2015 -- she has developed another small blister on her right anterior chest wall in the area where she's had tape for her dialysis access. this may just be injury caused by a tape burn. 02/18/2015 -- no new blisters and she had a dermatology opinion and they have taken a biopsy of her skin. She also had a left brachial AV fistula placed this week. 03/03/2015 -- though we do not have the pathology report yet the patient says she has been put on prednisone because this skin disease is possibly to do with her immune system and her dermatologist is recommended this. 03/10/2015 -- she has developed a new blister which is quite  large on her right lower extremity  on the shin. 05/22/2015 -- she did very well with her dressing but on trying to remove the [place] it peels away Vore, Diahann S. ([id number]) the newly healed skin. 06/05/2015 -- the patient was in the ER this past week for severe bleeding from the right nostril and had to have ENT see her and do a packing. They've also been holding her heparin during her dialysis. The patient is going back to ENT today. Other than that she has had no other significant issues. 06/19/2015 -- she is back on her blood thinners and continues to have her hemodialysis as before. 07/31/2015 -- a large blister has popped up again on her right lower extremity in the same place in the mid shin area in the anterior lateral compartment. Electronic Signature(s) Signed: 09/04/2015 12:42:46 PM By: Judene Companion MD Entered By: Judene Companion on 09/04/2015 10:54:13 Daw, [name] ([id number]) -------------------------------------------------------------------------------- Physical Exam Details [name], [name] 09/04/2015 10:00 Date of Service: AM Medical Record [id number] Number: Patient Account Number: [id number] 1956/07/06 (60 y.o. Treating RN: Baruch Gouty, RN, [name], [name] Date of Birth/Sex: Female) Other Clinician: Primary Care Physician: Nena Alexander, Clarence Cogswell Referring Physician: [name] Physician/Extender: Weeks in [id number] Electronic Signature(s) Signed: 09/04/2015 12:42:46 PM By: Judene Companion MD Entered By: Judene Companion on 09/04/2015 10:54:20 Vandehei, [name] ([id number]) -------------------------------------------------------------------------------- Physician Orders Details [name], [name] 09/04/2015 10:00 Date of Service: AM Medical Record [id number] Number: Patient Account Number: [id number] 1956/01/28 (60 y.o. Treating RN: Baruch Gouty, RN, [name], [name] Date of Birth/Sex: Female) Other Clinician: Primary Care Physician: [name] Treating Jerline Pain, Jerris Fleer Referring Physician: [name] Physician/Extender: [place]ella [place] in Treatment: 51 Verbal / Phone Orders: Yes Clinician: [name], RN, [name], [name] Read Back and Verified: Yes Diagnosis Coding Wound Cleansing Wound #1 Right Back o Clean wound with Normal Saline. Primary Wound Dressing Wound #1 Right Back o Prisma Ag Secondary Dressing Wound #1 Right Back o Boardered Foam Dressing Dressing Change Frequency Wound #1 Right Back o Change dressing every other day. Follow-up Appointments Wound #1 Right Back o Return Appointment in 1 week. Electronic Signature(s) Signed: 09/04/2015 10:08:57 AM By: Regan Lemming [name], RN Signed: 09/04/2015 12:42:46 PM By: Judene Companion MD Entered By: Regan Lemming on 09/04/2015 10:08:57 Shives, [name] ([id number]) -------------------------------------------------------------------------------- Problem List Details [name], [name] 09/04/2015 10:00 Date of Service: AM Medical Record [id number] Number: Patient Account Number: [id number] Mar 29, 1956 (60 y.o. Treating RN: Baruch Gouty, RN, [name], [name] Date of Birth/Sex: Female) Other Clinician: Primary Care Physician: Nena Alexander, Elyna Pangilinan Referring Physician: [name] Physician/Extender: [name]: 36 Active Problems ICD-10 Encounter Code Description Active Date Diagnosis E11.622 Type 2 diabetes mellitus with other skin ulcer 12/23/2014 Yes L89.112 Pressure ulcer of right upper back, stage 2 12/23/2014 Yes S80.821A Blister (nonthermal), right lower leg, initial encounter 12/23/2014 Yes N18.6 End stage renal disease 12/23/2014 Yes Inactive Problems Resolved Problems Electronic Signature(s) Signed: 09/04/2015 12:42:46 PM By: Judene Companion MD Entered By: Judene Companion on 09/04/2015 10:53:50 Kydd, [name] ([id number]) -------------------------------------------------------------------------------- Progress Note Details [name]. 09/04/2015 10:00 Date of Service: AM Medical Record [id number] Number: Patient Account Number: [id number] 23-Apr-1956 (60 y.o. Treating RN: Baruch Gouty, RN, [name], [name] Date of Birth/Sex: Female) Other Clinician: Primary Care Physician: Nena Alexander, Latorsha Curling Referring Physician: [name] Physician/Extender: [name]: 36 Subjective Chief Complaint Information obtained from Patient Patient presents to the wound care center for a consult due non healing wound. She has an open wound on  her right upper back which she's had for about a year and she recently noticed a blister on her right lower extremity about 2 weeks ago. History of Present Illness (HPI) The following HPI elements were documented for the patient's wound: Location: right upper back and right lower extremity wounds Quality: Patient reports No Pain. Severity: Patient states wound (s) are getting better. Duration: Patient has had the wound for > 3 months prior to seeking treatment at the wound center Timing: she thought it first occurred when she was using a heating pad about a year ago after back surgery. Context: The wound appeared gradually over time Modifying Factors: Patient is currently on renal dialysis and receives treatments 3 times weekly Associated Signs and Symptoms: Patient reports having: surgery scheduled for this week for a AV fistula left arm. 60 year old patient who is known to be a diabetic and has end-stage renal disease has had several comorbidities including coronary artery disease, hypertension, hyperlipidemia, pancreatitis, anemia, previous history of hysterectomy, cholecystectomy, left-sided salivary gland excision, bilateral cataract surgery,Peritoneal dialysis catheter, hemodialysis catheter. the area on the back has also been caused by instant pressure she used to sleep on a recliner all day and has significant kyphoscoliosis. As far as the wound on her right lower  extremity she's not sure how this blister occurred but she thought it has been there for about 2 weeks. No recent blood investigations available and no recent hemoglobin A1c. 12/30/2014 -- she is an assisted living facility but I believe the nurses that have not followed instructions as she had some cream applied on her back and there was a different dressing. Last week she's had a AV fistula placed on her left forearm. 01/13/2015 -- she has had some localized infection at the port site and she's been on doxycycline for this. 02/05/2015 - he has developed a small blister on her right lower extremity. 02/10/2015 -- she has developed another small blister on her right anterior chest wall in the area where she's had tape for her dialysis access. this may just be injury caused by a tape burn. [name], [name] ([id number]) 02/18/2015 -- no new blisters and she had a dermatology opinion and they have taken a biopsy of her skin. She also had a left brachial AV fistula placed this week. 03/03/2015 -- though we do not have the pathology report yet the patient says she has been put on prednisone because this skin disease is possibly to do with her immune system and her dermatologist is recommended this. 03/10/2015 -- she has developed a new blister which is quite large on her right lower extremity on the shin. 05/22/2015 -- she did very well with her dressing but on trying to remove the [place] it peels away the newly healed skin. 06/05/2015 -- the patient was in the ER this past week for severe bleeding from the right nostril and had to have ENT see her and do a packing. They've also been holding her heparin during her dialysis. The patient is going back to ENT today. Other than that she has had no other significant issues. 06/19/2015 -- she is back on her blood thinners and continues to have her hemodialysis as before. 07/31/2015 -- a large blister has popped up again on her right lower extremity  in the same place in the mid shin area in the anterior lateral compartment. Objective Constitutional Vitals Time Taken: 9:57 AM, Height: 65 in, Weight: 156 lbs, BMI: 26, Temperature: 98.1 F, Pulse: 82 bpm, Respiratory Rate:  18 breaths/min, Blood Pressure: 178/71 mmHg. Integumentary (Hair, Skin) Wound #1 status is Open. Original cause of wound was Blister. The wound is located on the Right Back. The wound measures 4cm length x 2.5cm width x 0.1cm depth; 7.854cm^2 area and 0.785cm^3 volume. The wound is limited to skin breakdown. There is no tunneling or undermining noted. There is a medium amount of sanguinous drainage noted. The wound margin is indistinct and nonvisible. There is large (67- 100%) red granulation within the wound bed. There is no necrotic tissue within the wound bed. The periwound skin appearance exhibited: Moist. The periwound skin appearance did not exhibit: Callus, Crepitus, Excoriation, Fluctuance, Friable, Induration, Localized Edema, Rash, Scarring, Dry/Scaly, Maceration, Atrophie Blanche, Cyanosis, Ecchymosis, Hemosiderin Staining, Mottled, Pallor, Rubor, Erythema. Periwound temperature was noted as No Abnormality. Assessment Active Problems ICD-10 [name], [name] ([id number]) E11.622 - Type 2 diabetes mellitus with other skin ulcer L89.112 - Pressure ulcer of right upper back, stage 2 S80.821A - Blister (nonthermal), right lower leg, initial encounter N18.6 - End stage renal disease Pressure ulcer back smaller and superficial. Starting collagen dressings. Plan Wound Cleansing: Wound #1 Right Back: Clean wound with Normal Saline. Primary Wound Dressing: Wound #1 Right Back: Prisma Ag Secondary Dressing: Wound #1 Right Back: Boardered Foam Dressing Dressing Change Frequency: Wound #1 Right Back: Change dressing every other day. Follow-up Appointments: Wound #1 Right Back: Return Appointment in 1 week. Follow-Up Appointments: A Patient Clinical Summary  of Care was provided to LD Electronic Signature(s) Signed: 09/04/2015 12:38:07 PM By: Judene Companion MD Entered By: Judene Companion on 09/04/2015 12:38:07 [place], [name] ([id number]) -------------------------------------------------------------------------------- SuperBill Details [name]. Date of Service: 09/04/2015 Medical Record Patient Account Number: [id number] Number: [name], RN, [name], Treating RN: 10-Sep-1955 [phone] y.o. [name] Date of Birth/Sex: Female) Other Clinician: Primary Care Physician: Nena Alexander, Wilma Wuthrich Referring Physician: [name] Physician/Extender: [name]: 36 Diagnosis Coding ICD-10 Codes Code Description E11.622 Type 2 diabetes mellitus with other skin ulcer L89.112 Pressure ulcer of right upper back, stage 2 S80.821A Blister (nonthermal), right lower leg, initial encounter N18.6 End stage renal disease Facility Procedures CPT4 Code: [id number] Description: [id number] - WOUND CARE VISIT-LEV 2 EST PT Modifier: Quantity: 1 Physician Procedures CPT4 Code: [id number] Description: [id number] - WC PHYS LEVEL 3 - EST PT ICD-10 Description Diagnosis E11.622 Type 2 diabetes mellitus with other skin ulcer L89.112 Pressure ulcer of right upper back, stage 2 S80.821A Blister (nonthermal), right lower leg, initial enc N18.6 End  stage renal disease Modifier: ounter Quantity: 1 Electronic Signature(s) Signed: 09/04/2015 12:38:35 PM By: Judene Companion MD Entered By: Judene Companion on 09/04/2015 12:38:35

## 2015-09-10 ENCOUNTER — Other Ambulatory Visit: Payer: Self-pay | Admitting: Obstetrics and Gynecology

## 2015-09-10 ENCOUNTER — Other Ambulatory Visit: Payer: Self-pay | Admitting: Internal Medicine

## 2015-09-10 DIAGNOSIS — Z1231 Encounter for screening mammogram for malignant neoplasm of breast: Secondary | ICD-10-CM

## 2015-09-11 ENCOUNTER — Encounter: Payer: Medicare Other | Admitting: Surgery

## 2015-09-11 DIAGNOSIS — E11622 Type 2 diabetes mellitus with other skin ulcer: Secondary | ICD-10-CM | POA: Diagnosis not present

## 2015-09-12 NOTE — Progress Notes (Signed)
[name], [name] ([id number]) Visit Report for 09/11/2015 Arrival Information Details [name], [name]. Date of Service: 09/11/2015 8:45 AM Medical Record Number: [id number] Patient Account Number: [id number] Date of Birth/Sex: Feb 12, 1956 (60 y.o. Female) Treating RN: [name], RN, [name], [name] Primary Care Physician: [name] Other Clinician: Referring Physician: [name] Treating Physician/Extender: Frann Rider in Treatment: 52 Visit Information History Since Last Visit Added or deleted any medications: No Patient Arrived: Walker Any new allergies or adverse reactions: No Arrival Time: 08:54 Had a fall or experienced change in No Accompanied By: SELF activities of daily living that may affect Transfer Assistance: None risk of falls: Patient Identification Verified: Yes Signs or symptoms of abuse/neglect since last No Secondary Verification Process Yes visito Completed: Hospitalized since last visit: No Patient Requires Transmission- No Has Dressing in Place as Prescribed: Yes Based Precautions: Pain Present Now: No Patient Has Alerts: Yes Patient Alerts: Patient on Blood Thinner Electronic Signature(s) Signed: 09/11/2015 5:01:36 PM By: Regan Lemming [name], RN Entered By: Regan Lemming on 09/11/2015 08:55:27 [name] ([id number]) -------------------------------------------------------------------------------- Clinic Level of Care Assessment Details [name]. Date of Service: 09/11/2015 8:45 AM Medical Record Number: [id number] Patient Account Number: [id number] Date of Birth/Sex: 1956-01-16 (60 y.o. Female) Treating RN: [name], RN, [name], [name] Primary Care Physician: [name] Other Clinician: Referring Physician: [name] Treating Physician/Extender: Frann Rider in Treatment: 61 Clinic Level of Care Assessment Items TOOL 4 Quantity Score []  - Use when only an EandM is performed on FOLLOW-UP visit 0 ASSESSMENTS -  Nursing Assessment / Reassessment X - Reassessment of Co-morbidities (includes updates in patient status) 1 10 X - Reassessment of Adherence to Treatment Plan 1 5 ASSESSMENTS - Wound and Skin Assessment / Reassessment X - Simple Wound Assessment / Reassessment - one wound 1 5 []  - Complex Wound Assessment / Reassessment - multiple wounds 0 []  - Dermatologic / Skin Assessment (not related to wound area) 0 ASSESSMENTS - Focused Assessment []  - Circumferential Edema Measurements - multi extremities 0 []  - Nutritional Assessment / Counseling / Intervention 0 []  - Lower Extremity Assessment (monofilament, tuning fork, pulses) 0 []  - Peripheral Arterial Disease Assessment (using hand held doppler) 0 ASSESSMENTS - Ostomy and/or Continence Assessment and Care []  - Incontinence Assessment and Management 0 []  - Ostomy Care Assessment and Management (repouching, etc.) 0 PROCESS - Coordination of Care X - Simple Patient / Family Education for ongoing care 1 15 []  - Complex (extensive) Patient / Family Education for ongoing care 0 []  - Staff obtains Programmer, systems, Records, Test Results / Process Orders 0 []  - Staff telephones HHA, Nursing Homes / Clarify orders / etc 0 []  - Routine Transfer to another Facility (non-emergent condition) 0 [name], [name] ([id number]) []  - Routine Hospital Admission (non-emergent condition) 0 []  - New Admissions / Biomedical engineer / Ordering NPWT, Apligraf, etc. 0 []  - Emergency Hospital Admission (emergent condition) 0 []  - Simple Discharge Coordination 0 []  - Complex (extensive) Discharge Coordination 0 PROCESS - Special Needs []  - Pediatric / Minor Patient Management 0 []  - Isolation Patient Management 0 []  - Hearing / Language / Visual special needs 0 []  - Assessment of Community assistance (transportation, D/C planning, etc.) 0 []  - Additional assistance / Altered mentation 0 []  - Support Surface(s) Assessment (bed, cushion, seat, etc.) 0 INTERVENTIONS -  Wound Cleansing / Measurement X - Simple Wound Cleansing - one wound 1 5 []  - Complex Wound Cleansing - multiple wounds 0 X - Wound  Imaging (photographs - any number of wounds) 1 5 []  - Wound Tracing (instead of photographs) 0 X - Simple Wound Measurement - one wound 1 5 []  - Complex Wound Measurement - multiple wounds 0 INTERVENTIONS - Wound Dressings X - Small Wound Dressing one or multiple wounds 1 10 []  - Medium Wound Dressing one or multiple wounds 0 []  - Large Wound Dressing one or multiple wounds 0 []  - Application of Medications - topical 0 []  - Application of Medications - injection 0 INTERVENTIONS - Miscellaneous []  - External ear exam 0 [name], [name] ([id number]) []  - Specimen Collection (cultures, biopsies, blood, body fluids, etc.) 0 []  - Specimen(s) / Culture(s) sent or taken to Lab for analysis 0 []  - Patient Transfer (multiple staff / Harrel Lemon Lift / Similar devices) 0 []  - Simple Staple / Suture removal (25 or less) 0 []  - Complex Staple / Suture removal (26 or more) 0 []  - Hypo / Hyperglycemic Management (close monitor of Blood Glucose) 0 []  - Ankle / Brachial Index (ABI) - do not check if billed separately 0 X - Vital Signs 1 5 Has the patient been seen at the hospital within the last three years: Yes Total Score: 65 Level Of Care: New/Established - Level 2 Electronic Signature(s) Signed: 09/11/2015 5:01:36 PM By: Regan Lemming [name], RN Entered By: Regan Lemming on 09/11/2015 09:12:53 [name] ([id number]) -------------------------------------------------------------------------------- Encounter Discharge Information Details [name]. Date of Service: 09/11/2015 8:45 AM Medical Record Number: [id number] Patient Account Number: [id number] Date of Birth/Sex: Nov 10, 1955 (60 y.o. Female) Treating RN: Baruch Gouty, RN, [name], [name] Primary Care Physician: [name] Other Clinician: Referring Physician: [name] Treating Physician/Extender: Frann Rider in Treatment: 37 Encounter Discharge Information Items Discharge Pain Level: 0 Discharge Condition: Stable Ambulatory Status: Walker Discharge Destination: Nursing Home Transportation: Other Accompanied By: self Schedule Follow-up Appointment: No Medication Reconciliation completed No and provided to Patient/Care Gerilynn Mccullars: Provided on Clinical Summary of Care: 09/11/2015 Form Type Recipient Paper Patient Ld Electronic Signature(s) Signed: 09/11/2015 5:01:36 PM By: Regan Lemming [name], RN Previous Signature: 09/11/2015 9:12:54 AM Version By: Ruthine Dose Entered By: Regan Lemming on 09/11/2015 09:13:47 Bohnenkamp, [name] ([id number]) -------------------------------------------------------------------------------- Lower Extremity Assessment Details [name], [name] Date of Service: 09/11/2015 8:45 AM Medical Record Number: [id number] Patient Account Number: [id number] Date of Birth/Sex: [birth year] (60 y.o. Female) Treating RN: [name], RN, [name], [name] Primary Care Physician: [name] Other Clinician: Referring Physician: [name] Treating Physician/Extender: Frann Rider in Treatment: 37 Electronic Signature(s) Signed: 09/11/2015 5:01:36 PM By: Regan Lemming [name], RN Entered By: Regan Lemming on 09/11/2015 08:56:50 Fifer, [name] ([id number]) -------------------------------------------------------------------------------- Multi Wound Chart Details [name]. Date of Service: 09/11/2015 8:45 AM Medical Record Number: [id number] Patient Account Number: [id number] Date of Birth/Sex: [birth year] (60 y.o. Female) Treating RN: Baruch Gouty, RN, [name], [name] Primary Care Physician: [name] Other Clinician: Referring Physician: [name] Treating Physician/Extender: Frann Rider in Treatment: 37 Vital Signs Height(in): 65 Pulse(bpm): 92 Weight(lbs): 156 Blood Pressure 181/83 (mmHg): Body Mass Index(BMI): 26 Temperature(F):  98.0 Respiratory Rate 18 (breaths/min): Photos: [1:No Photos] [N/A:N/A] Wound Location: [1:Right Back] [N/A:N/A] Wounding Event: [1:Blister] [N/A:N/A] Primary Etiology: [1:Pressure Ulcer] [N/A:N/A] Comorbid History: [1:Glaucoma, Anemia, Coronary Artery Disease, Hypertension, Type II Diabetes, End Stage Renal Disease, Osteoarthritis, Neuropathy] [N/A:N/A] Date Acquired: [1:10/19/2013] [N/A:N/A] Weeks of Treatment: [1:37] [N/A:N/A] Wound Status: [1:Open] [N/A:N/A] Measurements L x W x D 5x5x0.1 [N/A:N/A] (cm) Area (cm) : [1:19.635] [N/A:N/A] Volume (cm) : [1:1.963] [N/A:N/A] %  Reduction in Area: [1:49.00%] [N/A:N/A] % Reduction in Volume: 49.00% [N/A:N/A] Classification: [1:Category/Stage II] [N/A:N/A] Exudate Amount: [1:Medium] [N/A:N/A] Exudate Type: [1:Sanguinous] [N/A:N/A] Exudate Color: [1:red] [N/A:N/A] Wound Margin: [1:Indistinct, nonvisible] [N/A:N/A] Granulation Amount: [1:Large (67-100%)] [N/A:N/A] Granulation Quality: [1:Red, Hyper-granulation] [N/A:N/A] Necrotic Amount: [1:None Present (0%)] [N/A:N/A] Exposed Structures: [1:Fascia: No Fat: No Tendon: No] [N/A:N/A] Muscle: No Joint: No Bone: No Limited to Skin Breakdown Epithelialization: Medium (34-66%) N/A N/A Periwound Skin Texture: Edema: No N/A N/A Excoriation: No Induration: No Callus: No Crepitus: No Fluctuance: No Friable: No Rash: No Scarring: No Periwound Skin Moist: Yes N/A N/A Moisture: Maceration: No Dry/Scaly: No Periwound Skin Color: Atrophie Blanche: No N/A N/A Cyanosis: No Ecchymosis: No Erythema: No Hemosiderin Staining: No Mottled: No Pallor: No Rubor: No Temperature: No Abnormality N/A N/A Tenderness on No N/A N/A Palpation: Wound Preparation: Ulcer Cleansing: N/A N/A Rinsed/Irrigated with Saline Topical Anesthetic Applied: None Treatment Notes Electronic Signature(s) Signed: 09/11/2015 5:01:36 PM By: Regan Lemming [name], RN Entered By: Regan Lemming on 09/11/2015  09:05:14 Guevara, [name] ([id number]) -------------------------------------------------------------------------------- [place] Details [name]. Date of Service: 09/11/2015 8:45 AM Medical Record Number: [id number] Patient Account Number: [id number] Date of Birth/Sex: 1956-06-06 (60 y.o. Female) Treating RN: [name], RN, [name], [name] Primary Care Physician: [name] Other Clinician: Referring Physician: [name] Treating Physician/Extender: Frann Rider in Treatment: 37 Active Inactive Abuse / Safety / Falls / Self Care Management Nursing Diagnoses: Potential for falls Goals: Patient will remain injury free Date Initiated: 12/23/2014 Goal Status: Active Patient/caregiver will verbalize understanding of skin care regimen Date Initiated: 12/23/2014 Goal Status: Active Patient/caregiver will verbalize/demonstrate measures taken to prevent injury and/or falls Date Initiated: 12/23/2014 Goal Status: Active Patient/caregiver will verbalize/demonstrate understanding of what to do in case of emergency Date Initiated: 12/23/2014 Goal Status: Active Interventions: Assess fall risk on admission and as needed Assess: immobility, friction, shearing, incontinence upon admission and as needed Assess impairment of mobility on admission and as needed per policy Assess self care needs on admission and as needed Provide education on fall prevention Notes: Wound/Skin Impairment Nursing Diagnoses: Impaired tissue integrity Knowledge deficit related to ulceration/compromised skin integrity Goals: Patient/caregiver will verbalize understanding of skin care regimen [name], [name] ([id number]) Date Initiated: 12/23/2014 Goal Status: Active Ulcer/skin breakdown will heal within 14 weeks Date Initiated: 12/23/2014 Goal Status: Active Interventions: Assess patient/caregiver ability to obtain necessary supplies Assess patient/caregiver ability to perform  ulcer/skin care regimen upon admission and as needed Assess ulceration(s) every visit Provide education on ulcer and skin care Treatment Activities: Skin care regimen initiated : 09/11/2015 Topical wound management initiated : 09/11/2015 Notes: Electronic Signature(s) Signed: 09/11/2015 5:01:36 PM By: Regan Lemming [name], RN Entered By: Regan Lemming on 09/11/2015 09:04:32 [name], [name] ([id number]) -------------------------------------------------------------------------------- Pain Assessment Details [name]. Date of Service: 09/11/2015 8:45 AM Medical Record Number: [id number] Patient Account Number: [id number] Date of Birth/Sex: 13-Nov-1955 (60 y.o. Female) Treating RN: Baruch Gouty, RN, [name], [name] Primary Care Physician: [name] Other Clinician: Referring Physician: [name] Treating Physician/Extender: Frann Rider in Treatment: 37 Active Problems Location of Pain Severity and Description of Pain Patient Has Paino No Site Locations Pain Management and Medication Current Pain Management: Electronic Signature(s) Signed: 09/11/2015 5:01:36 PM By: Regan Lemming [name], RN Entered By: Regan Lemming on 09/11/2015 08:55:33 [name], [name] ([id number]) -------------------------------------------------------------------------------- Patient/Caregiver Education Details [name]. Date of Service: 09/11/2015 8:45 AM Medical Record Number: [id number] Patient Account Number: [id number] Date of Birth/Gender: 24-Jun-1956 (60 y.o.  Female) Treating RN: Baruch Gouty, RN, [name], [name] Primary Care Physician: [name] Other Clinician: Referring Physician: [name] Treating Physician/Extender: Frann Rider in Treatment: 75 Education Assessment Education Provided To: Patient Education Topics Provided Safety: Methods: Explain/Verbal Responses: State content correctly Wound/Skin Impairment: Methods: Explain/Verbal Responses: State content  correctly Electronic Signature(s) Signed: 09/11/2015 5:01:36 PM By: Regan Lemming [name], RN Entered By: Regan Lemming on 09/11/2015 09:14:00 [place], [name] ([id number]) -------------------------------------------------------------------------------- Wound Assessment Details [name]. Date of Service: 09/11/2015 8:45 AM Medical Record Number: [id number] Patient Account Number: [id number] Date of Birth/Sex: 07-Dec-1955 (60 y.o. Female) Treating RN: [name], RN, [name], [name] Primary Care Physician: [name] Other Clinician: Referring Physician: [name] Treating Physician/Extender: Frann Rider in Treatment: 4 Wound Status Wound Number: 1 Primary Pressure Ulcer Etiology: Wound Location: Right Back Wound Open Wounding Event: Blister Status: Date Acquired: 10/19/2013 Comorbid Glaucoma, Anemia, Coronary Artery Weeks Of Treatment: 37 History: Disease, Hypertension, Type II Clustered Wound: No Diabetes, End Stage Renal Disease, Osteoarthritis, Neuropathy Wound Measurements Length: (cm) 5 Width: (cm) 5 Depth: (cm) 0.1 Area: (cm) 19.635 Volume: (cm) 1.963 % Reduction in Area: 49% % Reduction in Volume: 49% Epithelialization: Medium (34-66%) Tunneling: No Undermining: No Wound Description Classification: Category/Stage II Wound Margin: Indistinct, nonvisible Exudate Amount: Medium Exudate Type: Sanguinous Exudate Color: red Foul Odor After Cleansing: No Wound Bed Granulation Amount: Large (67-100%) Exposed Structure Granulation Quality: Red, Hyper-granulation Fascia Exposed: No Necrotic Amount: None Present (0%) Fat Layer Exposed: No Tendon Exposed: No Muscle Exposed: No Joint Exposed: No Bone Exposed: No Limited to Skin Breakdown Periwound Skin Texture Texture Color No Abnormalities Noted: No No Abnormalities Noted: No Callus: No Atrophie Blanche: No Crepitus: No Cyanosis: No [name], [name] ([id number]) Excoriation: No Ecchymosis:  No Fluctuance: No Erythema: No Friable: No Hemosiderin Staining: No Induration: No Mottled: No Localized Edema: No Pallor: No Rash: No Rubor: No Scarring: No Temperature / Pain Moisture Temperature: No Abnormality No Abnormalities Noted: No Dry / Scaly: No Maceration: No Moist: Yes Wound Preparation Ulcer Cleansing: Rinsed/Irrigated with Saline Topical Anesthetic Applied: None Treatment Notes Wound #1 (Right Back) 1. Cleansed with: Clean wound with Normal Saline 4. Dressing Applied: Other dressing (specify in notes) Notes cutimed sorbact Electronic Signature(s) Signed: 09/11/2015 5:01:36 PM By: Regan Lemming [name], RN Entered By: Regan Lemming on 09/11/2015 09:01:11 [name], [name] ([id number]) -------------------------------------------------------------------------------- Vitals Details [name]. Date of Service: 09/11/2015 8:45 AM Medical Record Number: [id number] Patient Account Number: [id number] Date of Birth/Sex: 05-31-1956 (60 y.o. Female) Treating RN: [name], RN, [name], [name] Primary Care Physician: [name] Other Clinician: Referring Physician: [name] Treating Physician/Extender: Frann Rider in Treatment: 37 Vital Signs Time Taken: 08:55 Temperature (F): 98.0 Height (in): 65 Pulse (bpm): 92 Weight (lbs): 156 Respiratory Rate (breaths/min): 18 Body Mass Index (BMI): 26 Blood Pressure (mmHg): 181/83 Reference Range: 80 - 120 mg / dl Electronic Signature(s) Signed: 09/11/2015 5:01:36 PM By: Regan Lemming [name], RN Entered By: Regan Lemming on 09/11/2015 08:56:08

## 2015-09-12 NOTE — Progress Notes (Signed)
LIZMARI, TOOMEY ([id number]) Visit Report for 09/11/2015 Chief Complaint Document Details [name]. Date of Service: 09/11/2015 8:45 AM Medical Record Patient Account Number: [id number] Number: [name], RN, [name], Treating RN: 1955-07-28 (60 y.o. [name] Date of Birth/Sex: Female) Other Clinician: Primary Care Physician: Brynda Greathouse, ERNEST Treating Lavarius Doughten Referring Physician: [name] Physician/Extender: [name]: 37 Information Obtained from: Patient Chief Complaint Patient presents to the wound care center for a consult due non healing wound. She has an open wound on her right upper back which she's had for about a year and she recently noticed a blister on her right lower extremity about 2 weeks ago. Electronic Signature(s) Signed: 09/11/2015 9:10:09 AM By: [name], [name] Entered By: [name] on 09/11/2015 09:10:09 Branscome, [name] ([id number]) -------------------------------------------------------------------------------- HPI Details [name]. Date of Service: 09/11/2015 8:45 AM Medical Record Patient Account Number: [id number] Number: [name], RN, [name], Treating RN: 07/15/1956 (60 y.o. [name] Date of Birth/Sex: Female) Other Clinician: Primary Care Physician: Brynda Greathouse, ERNEST Treating Rosalita Carey Referring Physician: [name] Physician/Extender: [name]: 34 History of Present Illness Location: right upper back and right lower extremity wounds Quality: Patient reports No Pain. Severity: Patient states wound (s) are getting better. Duration: Patient has had the wound for > 3 months prior to seeking treatment at the wound center Timing: she thought it first occurred when she was using a heating pad about a year ago after back surgery. Context: The wound appeared gradually over time Modifying Factors: Patient is currently on renal dialysis and receives treatments 3 times  weekly Associated Signs and Symptoms: Patient reports having: surgery scheduled for this week for a AV fistula left arm. HPI Description: 60 year old patient who is known to be a diabetic and has end-stage renal disease has had several comorbidities including coronary artery disease, hypertension, hyperlipidemia, pancreatitis, anemia, previous history of hysterectomy, cholecystectomy, left-sided salivary gland excision, bilateral cataract surgery,Peritoneal dialysis catheter, hemodialysis catheter. the area on the back has also been caused by instant pressure she used to sleep on a recliner all day and has significant kyphoscoliosis. As far as the wound on her right lower extremity she's not sure how this blister occurred but she thought it has been there for about 2 weeks. No recent blood investigations available and no recent hemoglobin A1c. 12/30/2014 -- she is an assisted living facility but I believe the nurses that have not followed instructions as she had some cream applied on her back and there was a different dressing. Last week she's had a AV fistula placed on her left forearm. 01/13/2015 -- she has had some localized infection at the port site and she's been on doxycycline for this. 02/05/2015 - he has developed a small blister on her right lower extremity. 02/10/2015 -- she has developed another small blister on her right anterior chest wall in the area where she's had tape for her dialysis access. this may just be injury caused by a tape burn. 02/18/2015 -- no new blisters and she had a dermatology opinion and they have taken a biopsy of her skin. She also had a left brachial AV fistula placed this week. 03/03/2015 -- though we do not have the pathology report yet the patient says she has been put on prednisone because this skin disease is possibly to do with her immune system and her dermatologist is recommended this. 03/10/2015 -- she has developed a new blister which is quite  large on her right lower  extremity on the shin. 05/22/2015 -- she did very well with her dressing but on trying to remove the [place] it peels away Moehle, Averyanna S. ([id number]) the newly healed skin. 06/05/2015 -- the patient was in the ER this past week for severe bleeding from the right nostril and had to have ENT see her and do a packing. They've also been holding her heparin during her dialysis. The patient is going back to ENT today. Other than that she has had no other significant issues. 06/19/2015 -- she is back on her blood thinners and continues to have her hemodialysis as before. 07/31/2015 -- a large blister has popped up again on her right lower extremity in the same place in the mid shin area in the anterior lateral compartment. Electronic Signature(s) Signed: 09/11/2015 9:10:14 AM By: [name], [name] Entered By: [name] on 09/11/2015 09:10:14 Hogrefe, [name] ([id number]) -------------------------------------------------------------------------------- Physical Exam Details [name]. Date of Service: 09/11/2015 8:45 AM Medical Record Patient Account Number: [id number] Number: [name], RN, [name], Treating RN: 05-08-1956 (60 y.o. [name] Date of Birth/Sex: Female) Other Clinician: Primary Care Physician: Brynda Greathouse, ERNEST Treating Marjo Grosvenor Referring Physician: [name] Physician/Extender: [name]: 37 Constitutional . Pulse regular. Respirations normal and unlabored. Afebrile. . Eyes Nonicteric. Reactive to light. Ears, Nose, Mouth, and Throat Lips, teeth, and gums WNL.Marland Kitchen Moist mucosa without lesions. Neck supple and nontender. No palpable supraclavicular or cervical adenopathy. Normal sized without goiter. Respiratory WNL. No retractions.. Cardiovascular Pedal Pulses WNL. No clubbing, cyanosis or edema. Lymphatic No adneopathy. No adenopathy. No adenopathy. Musculoskeletal Adexa without tenderness or  enlargement.. Digits and nails w/o clubbing, cyanosis, infection, petechiae, ischemia, or inflammatory conditions.. Integumentary (Hair, Skin) No suspicious lesions. No crepitus or fluctuance. No peri-wound warmth or erythema. No masses.Marland Kitchen Psychiatric Judgement and insight Intact.. No evidence of depression, anxiety, or agitation.. Notes the ulcerated area on the upper back continues to be clean and has just superficial ulceration which requires no debridement. Electronic Signature(s) Signed: 09/11/2015 9:11:01 AM By: [name], [name] Entered By: [name] on 09/11/2015 09:11:00 Romanski, [name] ([id number]) -------------------------------------------------------------------------------- Physician Orders Details Patient Name: [name] Date of Service: 09/11/2015 8:45 AM Medical Record Patient Account Number: [id number] Number: [name], RN, [name], Treating RN: 03/10/1956 (60 y.o. [name] Date of Birth/Sex: Female) Other Clinician: Primary Care Physician: Brynda Greathouse, ERNEST Treating Courtney Bellizzi Referring Physician: [name] Physician/Extender: [place]ella [place] in Treatment: 34 Verbal / Phone Orders: Yes Clinician: [name], RN, [name], [name] Read Back and Verified: Yes Diagnosis Coding Wound Cleansing Wound #1 Right Back o Clean wound with Normal Saline. Anesthetic Wound #1 Right Back o Topical Lidocaine 4% cream applied to wound bed prior to debridement Skin Barriers/Peri-Wound Care Wound #1 Right Back o Skin Prep Primary Wound Dressing Wound #1 Right Back o Other: - siltec sorbact - SNF RN may change dressing if needed as drainage pass white border with nonadherent pad and bordered foam dressing if siltec sorbact is not available Dressing Change Frequency Wound #1 Right Back o Change dressing every week - unless saturated Follow-up Appointments Wound #1 Right Back o Return Appointment in 1 week. Edema Control o Support Garment 20-30 mm/Hg pressure to: -  both lower legs - SNF RN to order these for patient to wear daily and take off at bedtime Electronic Signature(s) Signed: 09/11/2015 4:56:18 PM By: [name], [name] Signed: 09/11/2015 5:01:36 PM By: Regan Lemming [name], RN Pring, Dhwani Chauncey Cruel ([id number]) Entered By: Regan Lemming on 09/11/2015  09:07:21 JERISHA, RISPER ([id number]) -------------------------------------------------------------------------------- Problem List Details Patient Name: ANIIYA, TRACY. Date of Service: 09/11/2015 8:45 AM Medical Record Patient Account Number: [id number] Number: [name], RN, [name], Treating RN: 01/25/1956 (59 y.o. [name] Date of Birth/Sex: Female) Other Clinician: Primary Care Physician: [name] Treating [name] Referring Physician: [name] Physician/Extender: [name]: 37 Active Problems ICD-10 Encounter Code Description Active Date Diagnosis E11.622 Type 2 diabetes mellitus with other skin ulcer 12/23/2014 Yes L89.112 Pressure ulcer of right upper back, stage 2 12/23/2014 Yes S80.821A Blister (nonthermal), right lower leg, initial encounter 12/23/2014 Yes N18.6 End stage renal disease 12/23/2014 Yes Inactive Problems Resolved Problems Electronic Signature(s) Signed: 09/11/2015 9:10:03 AM By: [name], [name] Entered By: [name] on 09/11/2015 09:10:03 Favero, [name] ([id number]) -------------------------------------------------------------------------------- Progress Note Details Patient Name: Lesniewski, Tenille S. Date of Service: 09/11/2015 8:45 AM Medical Record Patient Account Number: [id number] Number: [name], RN, [name], Treating RN: 12-31-1955 (60 y.o. [name] Date of Birth/Sex: Female) Other Clinician: Primary Care Physician: Brynda Greathouse, ERNEST Treating Chloeann Alfred Referring Physician: [name] Physician/Extender: [name]: 37 Subjective Chief Complaint Information obtained from Patient Patient presents to the wound care center  for a consult due non healing wound. She has an open wound on her right upper back which she's had for about a year and she recently noticed a blister on her right lower extremity about 2 weeks ago. History of Present Illness (HPI) The following HPI elements were documented for the patient's wound: Location: right upper back and right lower extremity wounds Quality: Patient reports No Pain. Severity: Patient states wound (s) are getting better. Duration: Patient has had the wound for > 3 months prior to seeking treatment at the wound center Timing: she thought it first occurred when she was using a heating pad about a year ago after back surgery. Context: The wound appeared gradually over time Modifying Factors: Patient is currently on renal dialysis and receives treatments 3 times weekly Associated Signs and Symptoms: Patient reports having: surgery scheduled for this week for a AV fistula left arm. 60 year old patient who is known to be a diabetic and has end-stage renal disease has had several comorbidities including coronary artery disease, hypertension, hyperlipidemia, pancreatitis, anemia, previous history of hysterectomy, cholecystectomy, left-sided salivary gland excision, bilateral cataract surgery,Peritoneal dialysis catheter, hemodialysis catheter. the area on the back has also been caused by instant pressure she used to sleep on a recliner all day and has significant kyphoscoliosis. As far as the wound on her right lower extremity she's not sure how this blister occurred but she thought it has been there for about 2 weeks. No recent blood investigations available and no recent hemoglobin A1c. 12/30/2014 -- she is an assisted living facility but I believe the nurses that have not followed instructions as she had some cream applied on her back and there was a different dressing. Last week she's had a AV fistula placed on her left forearm. 01/13/2015 -- she has had some localized  infection at the port site and she's been on doxycycline for this. 02/05/2015 - he has developed a small blister on her right lower extremity. 02/10/2015 -- she has developed another small blister on her right anterior chest wall in the area where she's had tape for her dialysis access. this may just be injury caused by a tape burn. QUINTISHA, GOODENOW ([id number]) 02/18/2015 -- no new blisters and she had a dermatology opinion and they have taken a biopsy of her skin. She  also had a left brachial AV fistula placed this week. 03/03/2015 -- though we do not have the pathology report yet the patient says she has been put on prednisone because this skin disease is possibly to do with her immune system and her dermatologist is recommended this. 03/10/2015 -- she has developed a new blister which is quite large on her right lower extremity on the shin. 05/22/2015 -- she did very well with her dressing but on trying to remove the [place] it peels away the newly healed skin. 06/05/2015 -- the patient was in the ER this past week for severe bleeding from the right nostril and had to have ENT see her and do a packing. They've also been holding her heparin during her dialysis. The patient is going back to ENT today. Other than that she has had no other significant issues. 06/19/2015 -- she is back on her blood thinners and continues to have her hemodialysis as before. 07/31/2015 -- a large blister has popped up again on her right lower extremity in the same place in the mid shin area in the anterior lateral compartment. Objective Constitutional Pulse regular. Respirations normal and unlabored. Afebrile. Vitals Time Taken: 8:55 AM, Height: 65 in, Weight: 156 lbs, BMI: 26, Temperature: 98.0 F, Pulse: 92 bpm, Respiratory Rate: 18 breaths/min, Blood Pressure: 181/83 mmHg. Eyes Nonicteric. Reactive to light. Ears, Nose, Mouth, and Throat Lips, teeth, and gums WNL.Marland Kitchen Moist mucosa without  lesions. Neck supple and nontender. No palpable supraclavicular or cervical adenopathy. Normal sized without goiter. Respiratory WNL. No retractions.. Cardiovascular Pedal Pulses WNL. No clubbing, cyanosis or edema. Lymphatic No adneopathy. No adenopathy. No adenopathy. [name] ([id number]) Musculoskeletal Adexa without tenderness or enlargement.. Digits and nails w/o clubbing, cyanosis, infection, petechiae, ischemia, or inflammatory conditions.Marland Kitchen Psychiatric Judgement and insight Intact.. No evidence of depression, anxiety, or agitation.. General Notes: the ulcerated area on the upper back continues to be clean and has just superficial ulceration which requires no debridement. Integumentary (Hair, Skin) No suspicious lesions. No crepitus or fluctuance. No peri-wound warmth or erythema. No masses.. Wound #1 status is Open. Original cause of wound was Blister. The wound is located on the Right Back. The wound measures 5cm length x 5cm width x 0.1cm depth; 19.635cm^2 area and 1.963cm^3 volume. The wound is limited to skin breakdown. There is no tunneling or undermining noted. There is a medium amount of sanguinous drainage noted. The wound margin is indistinct and nonvisible. There is large (67- 100%) red granulation within the wound bed. There is no necrotic tissue within the wound bed. The periwound skin appearance exhibited: Moist. The periwound skin appearance did not exhibit: Callus, Crepitus, Excoriation, Fluctuance, Friable, Induration, Localized Edema, Rash, Scarring, Dry/Scaly, Maceration, Atrophie Blanche, Cyanosis, Ecchymosis, Hemosiderin Staining, Mottled, Pallor, Rubor, Erythema. Periwound temperature was noted as No Abnormality. Assessment Active Problems ICD-10 E11.622 - Type 2 diabetes mellitus with other skin ulcer L89.112 - Pressure ulcer of right upper back, stage 2 S80.821A - Blister (nonthermal), right lower leg, initial encounter N18.6 - End stage renal  disease Plan Wound Cleansing: Wound #1 Right Back: Clean wound with Normal Saline. Anesthetic: Wound #1 Right Back: BIDDIE, FASSBENDER ([id number]) Topical Lidocaine 4% cream applied to wound bed prior to debridement Skin Barriers/Peri-Wound Care: Wound #1 Right Back: Skin Prep Primary Wound Dressing: Wound #1 Right Back: Other: - siltec sorbact - SNF RN may change dressing if needed as drainage pass white border with nonadherent pad and bordered foam dressing if siltec sorbact is not  available Dressing Change Frequency: Wound #1 Right Back: Change dressing every week - unless saturated Follow-up Appointments: Wound #1 Right Back: Return Appointment in 1 week. Edema Control: Support Garment 20-30 mm/Hg pressure to: - both lower legs - SNF RN to order these for patient to wear daily and take off at bedtime Camerin's condition is due to a dermatological issue with bullous skin disease which keeps opening out from time to time. She has been fully worked up by dermatology and is on suppressive doses of prednisone. Will continue supportive care with Siltec Sorbact to be applied as needed to her back and hopefully we can leave it on for 3-4 days before changing. Electronic Signature(s) Signed: 09/11/2015 9:12:01 AM By: [name], [name] Entered By: [name] on 09/11/2015 09:12:01 Crosby, [name] ([id number]) -------------------------------------------------------------------------------- SuperBill Details Patient Name: [name] Date of Service: 09/11/2015 Medical Record Patient Account Number: [id number] Number: [name], RN, [name], Treating RN: 03-Oct-1955 (60 y.o. [name] Date of Birth/Sex: Female) Other Clinician: Primary Care Physician: [name] Treating Jaskaran Dauzat Referring Physician: [name] Physician/Extender: [name]: 37 Diagnosis Coding ICD-10 Codes Code Description E11.622 Type 2 diabetes mellitus with other skin ulcer L89.112  Pressure ulcer of right upper back, stage 2 S80.821A Blister (nonthermal), right lower leg, initial encounter N18.6 End stage renal disease Physician Procedures CPT4 Code: [id number] Description: [id number] - WC PHYS LEVEL 3 - EST PT ICD-10 Description Diagnosis E11.622 Type 2 diabetes mellitus with other skin ulcer L89.112 Pressure ulcer of right upper back, stage 2 S80.821A Blister (nonthermal), right lower leg, initial enc N18.6 End  stage renal disease Modifier: ounter Quantity: 1 Electronic Signature(s) Signed: 09/11/2015 9:12:13 AM By: [name], [name] Entered By: [name] on 09/11/2015 09:12:13

## 2015-09-18 ENCOUNTER — Encounter: Payer: Medicare Other | Attending: Surgery | Admitting: Surgery

## 2015-09-18 DIAGNOSIS — X58XXXA Exposure to other specified factors, initial encounter: Secondary | ICD-10-CM | POA: Insufficient documentation

## 2015-09-18 DIAGNOSIS — E11622 Type 2 diabetes mellitus with other skin ulcer: Secondary | ICD-10-CM | POA: Insufficient documentation

## 2015-09-18 DIAGNOSIS — S80821A Blister (nonthermal), right lower leg, initial encounter: Secondary | ICD-10-CM | POA: Insufficient documentation

## 2015-09-18 DIAGNOSIS — E1122 Type 2 diabetes mellitus with diabetic chronic kidney disease: Secondary | ICD-10-CM | POA: Diagnosis not present

## 2015-09-18 DIAGNOSIS — Z992 Dependence on renal dialysis: Secondary | ICD-10-CM | POA: Insufficient documentation

## 2015-09-18 DIAGNOSIS — L89112 Pressure ulcer of right upper back, stage 2: Secondary | ICD-10-CM | POA: Diagnosis not present

## 2015-09-18 DIAGNOSIS — I120 Hypertensive chronic kidney disease with stage 5 chronic kidney disease or end stage renal disease: Secondary | ICD-10-CM | POA: Insufficient documentation

## 2015-09-18 DIAGNOSIS — I2510 Atherosclerotic heart disease of native coronary artery without angina pectoris: Secondary | ICD-10-CM | POA: Diagnosis not present

## 2015-09-18 DIAGNOSIS — D649 Anemia, unspecified: Secondary | ICD-10-CM | POA: Insufficient documentation

## 2015-09-18 DIAGNOSIS — E785 Hyperlipidemia, unspecified: Secondary | ICD-10-CM | POA: Diagnosis not present

## 2015-09-18 DIAGNOSIS — N186 End stage renal disease: Secondary | ICD-10-CM | POA: Diagnosis not present

## 2015-09-19 NOTE — Progress Notes (Signed)
DREA, GOLIK ([id number]) Visit Report for 09/18/2015 Chief Complaint Document Details [name], [name]. Date of Service: 09/18/2015 10:00 AM Medical Record Patient Account Number: [id number] Number: [name], RN, [name], Treating RN: 07-20-1955 (60 y.o. [name] Date of Birth/Sex: Female) Other Clinician: Primary Care Physician: Brynda Greathouse, ERNEST Treating Kedron Uno Referring Physician: [name] Physician/Extender: [name]: 38 Information Obtained from: Patient Chief Complaint Patient presents to the wound care center for a consult due non healing wound. She has an open wound on her right upper back which she's had for about a year and she recently noticed a blister on her right lower extremity about 2 weeks ago. Electronic Signature(s) Signed: 09/18/2015 10:58:15 AM By: [name], [name] Entered By: [name] on 09/18/2015 10:58:15 Lipe, [name] ([id number]) -------------------------------------------------------------------------------- HPI Details [name], [name]. Date of Service: 09/18/2015 10:00 AM Medical Record Patient Account Number: [id number] Number: [name], RN, [name], Treating RN: [birth year] (60 y.o. [name] Date of Birth/Sex: Female) Other Clinician: Primary Care Physician: Brynda Greathouse, ERNEST Treating Merlean Pizzini Referring Physician: [name] Physician/Extender: [name]: 38 History of Present Illness Location: right upper back and right lower extremity wounds Quality: Patient reports No Pain. Severity: Patient states wound (s) are getting better. Duration: Patient has had the wound for > 3 months prior to seeking treatment at the wound center Timing: she thought it first occurred when she was using a heating pad about a year ago after back surgery. Context: The wound appeared gradually over time Modifying Factors: Patient is currently on renal dialysis and receives treatments 3 times  weekly Associated Signs and Symptoms: Patient reports having: surgery scheduled for this week for a AV fistula left arm. HPI Description: 60 year old patient who is known to be a diabetic and has end-stage renal disease has had several comorbidities including coronary artery disease, hypertension, hyperlipidemia, pancreatitis, anemia, previous history of hysterectomy, cholecystectomy, left-sided salivary gland excision, bilateral cataract surgery,Peritoneal dialysis catheter, hemodialysis catheter. the area on the back has also been caused by instant pressure she used to sleep on a recliner all day and has significant kyphoscoliosis. As far as the wound on her right lower extremity she's not sure how this blister occurred but she thought it has been there for about 2 weeks. No recent blood investigations available and no recent hemoglobin A1c. 12/30/2014 -- she is an assisted living facility but I believe the nurses that have not followed instructions as she had some cream applied on her back and there was a different dressing. Last week she's had a AV fistula placed on her left forearm. 01/13/2015 -- she has had some localized infection at the port site and she's been on doxycycline for this. 02/05/2015 - he has developed a small blister on her right lower extremity. 02/10/2015 -- she has developed another small blister on her right anterior chest wall in the area where she's had tape for her dialysis access. this may just be injury caused by a tape burn. 02/18/2015 -- no new blisters and she had a dermatology opinion and they have taken a biopsy of her skin. She also had a left brachial AV fistula placed this week. 03/03/2015 -- though we do not have the pathology report yet the patient says she has been put on prednisone because this skin disease is possibly to do with her immune system and her dermatologist is recommended this. 03/10/2015 -- she has developed a new blister which is quite  large on her right lower  extremity on the shin. 05/22/2015 -- she did very well with her dressing but on trying to remove the [place] it peels away [name], [name] ([id number]) the newly healed skin. 06/05/2015 -- the patient was in the ER this past week for severe bleeding from the right nostril and had to have ENT see her and do a packing. They've also been holding her heparin during her dialysis. The patient is going back to ENT today. Other than that she has had no other significant issues. 06/19/2015 -- she is back on her blood thinners and continues to have her hemodialysis as before. 07/31/2015 -- a large blister has popped up again on her right lower extremity in the same place in the mid shin area in the anterior lateral compartment. Electronic Signature(s) Signed: 09/18/2015 10:58:20 AM By: [name], [name] Entered By: [name] on 09/18/2015 10:58:20 [name], [name] ([id number]) -------------------------------------------------------------------------------- Physical Exam Details [name], [name]. Date of Service: 09/18/2015 10:00 AM Medical Record Patient Account Number: [id number] Number: [name], RN, [name], Treating RN: 08/07/1955 (60 y.o. [name] Date of Birth/Sex: Female) Other Clinician: Primary Care Physician: Brynda Greathouse, ERNEST Treating Madigan Rosensteel Referring Physician: [name] Physician/Extender: [name]: 38 Constitutional . Pulse regular. Respirations normal and unlabored. Afebrile. . Eyes Nonicteric. Reactive to light. Ears, Nose, Mouth, and Throat Lips, teeth, and gums WNL.Marland Kitchen Moist mucosa without lesions. Neck supple and nontender. No palpable supraclavicular or cervical adenopathy. Normal sized without goiter. Respiratory WNL. No retractions.. Breath sounds WNL, No rubs, rales, rhonchi, or wheeze.. Cardiovascular Heart rhythm and rate regular, no murmur or gallop.. Pedal Pulses WNL. No clubbing, cyanosis or  edema. Chest Breasts symmetical and no nipple discharge.. Breast tissue WNL, no masses, lumps, or tenderness.. Lymphatic No adneopathy. No adenopathy. No adenopathy. Musculoskeletal Adexa without tenderness or enlargement.. Digits and nails w/o clubbing, cyanosis, infection, petechiae, ischemia, or inflammatory conditions.. Integumentary (Hair, Skin) No suspicious lesions. No crepitus or fluctuance. No peri-wound warmth or erythema. No masses.Marland Kitchen Psychiatric Judgement and insight Intact.. No evidence of depression, anxiety, or agitation.. Notes there is some eschar which was gently removed but her nearly a PICC line skin is so fragile that a little bit of washing with saline and the skin just sloughs off Electronic Signature(s) Signed: 09/18/2015 10:[name]:12 AM By: [name], [name] Entered By: [name] on 09/18/2015 10:[name]:12 [name], [name] ([id number]) -------------------------------------------------------------------------------- Physician Orders Details [name]. Date of Service: 09/18/2015 10:00 AM Medical Record Patient Account Number: [id number] Number: [name], RN, [name], Treating RN: [birth year] (60 y.o. [name] Date of Birth/Sex: Female) Other Clinician: Primary Care Physician: Brynda Greathouse, ERNEST Treating Kinley Dozier Referring Physician: [name] Physician/Extender: Suella [place] in Treatment: 24 Verbal / Phone Orders: Yes Clinician: [name], RN, [name], [name] Read Back and Verified: Yes Diagnosis Coding Wound Cleansing Wound #1 Right Back o Clean wound with Normal Saline. Anesthetic Wound #1 Right Back o Topical Lidocaine 4% cream applied to wound bed prior to debridement Skin Barriers/Peri-Wound Care Wound #1 Right Back o Skin Prep Primary Wound Dressing Wound #1 Right Back o Other: - Cutimed siltec - SNF RN may change dressing if needed as drainage pass white border with nonadherent pad and bordered foam dressing if siltec sorbact is  not available Dressing Change Frequency Wound #1 Right Back o Change dressing every week - unless saturated Follow-up Appointments Wound #1 Right Back o Return Appointment in 1 week. Electronic Signature(s) Signed: 09/18/2015 10:35:16 AM By: Regan Lemming [name], RN Signed: 09/18/2015 4:29:10  PM By: [name], [name] Entered By: Regan Lemming on 09/18/2015 10:35:15 Ricciardi, [name] ([id number]) -------------------------------------------------------------------------------- Problem List Details Patient Name: CORNELIOUS, JEREMIAH. Date of Service: 09/18/2015 10:00 AM Medical Record Patient Account Number: [id number] Number: [name], RN, [name], Treating RN: [birth year] (60 y.o. [name] Date of Birth/Sex: Female) Other Clinician: Primary Care Physician: [name] Treating [name] Referring Physician: [name] Physician/Extender: [name]: 38 Active Problems ICD-10 Encounter Code Description Active Date Diagnosis E11.622 Type 2 diabetes mellitus with other skin ulcer 12/23/2014 Yes L89.112 Pressure ulcer of right upper back, stage 2 12/23/2014 Yes S80.821A Blister (nonthermal), right lower leg, initial encounter 12/23/2014 Yes N18.6 End stage renal disease 12/23/2014 Yes Inactive Problems Resolved Problems Electronic Signature(s) Signed: 09/18/2015 10:58:09 AM By: [name], [name] Entered By: [name] on 09/18/2015 10:58:08 Fleek, [name] ([id number]) -------------------------------------------------------------------------------- Progress Note Details Patient Name: [name], Ratasha S. Date of Service: 09/18/2015 10:00 AM Medical Record Patient Account Number: [id number] Number: [name], RN, [name], Treating RN: [birth year] (60 y.o. [name] Date of Birth/Sex: Female) Other Clinician: Primary Care Physician: Brynda Greathouse, ERNEST Treating Shameria Trimarco Referring Physician: [name] Physician/Extender: [name]: 38 Subjective Chief  Complaint Information obtained from Patient Patient presents to the wound care center for a consult due non healing wound. She has an open wound on her right upper back which she's had for about a year and she recently noticed a blister on her right lower extremity about 2 weeks ago. History of Present Illness (HPI) The following HPI elements were documented for the patient's wound: Location: right upper back and right lower extremity wounds Quality: Patient reports No Pain. Severity: Patient states wound (s) are getting better. Duration: Patient has had the wound for > 3 months prior to seeking treatment at the wound center Timing: she thought it first occurred when she was using a heating pad about a year ago after back surgery. Context: The wound appeared gradually over time Modifying Factors: Patient is currently on renal dialysis and receives treatments 3 times weekly Associated Signs and Symptoms: Patient reports having: surgery scheduled for this week for a AV fistula left arm. 60 year old patient who is known to be a diabetic and has end-stage renal disease has had several comorbidities including coronary artery disease, hypertension, hyperlipidemia, pancreatitis, anemia, previous history of hysterectomy, cholecystectomy, left-sided salivary gland excision, bilateral cataract surgery,Peritoneal dialysis catheter, hemodialysis catheter. the area on the back has also been caused by instant pressure she used to sleep on a recliner all day and has significant kyphoscoliosis. As far as the wound on her right lower extremity she's not sure how this blister occurred but she thought it has been there for about 2 weeks. No recent blood investigations available and no recent hemoglobin A1c. 12/30/2014 -- she is an assisted living facility but I believe the nurses that have not followed instructions as she had some cream applied on her back and there was a different dressing. Last week she's  had a AV fistula placed on her left forearm. 01/13/2015 -- she has had some localized infection at the port site and she's been on doxycycline for this. 02/05/2015 - he has developed a small blister on her right lower extremity. 02/10/2015 -- she has developed another small blister on her right anterior chest wall in the area where she's had tape for her dialysis access. this may just be injury caused by a tape burn. [name], [name] ([id number]) 02/18/2015 -- no new blisters and she had a  dermatology opinion and they have taken a biopsy of her skin. She also had a left brachial AV fistula placed this week. 03/03/2015 -- though we do not have the pathology report yet the patient says she has been put on prednisone because this skin disease is possibly to do with her immune system and her dermatologist is recommended this. 03/10/2015 -- she has developed a new blister which is quite large on her right lower extremity on the shin. 05/22/2015 -- she did very well with her dressing but on trying to remove the [place] it peels away the newly healed skin. 06/05/2015 -- the patient was in the ER this past week for severe bleeding from the right nostril and had to have ENT see her and do a packing. They've also been holding her heparin during her dialysis. The patient is going back to ENT today. Other than that she has had no other significant issues. 06/19/2015 -- she is back on her blood thinners and continues to have her hemodialysis as before. 07/31/2015 -- a large blister has popped up again on her right lower extremity in the same place in the mid shin area in the anterior lateral compartment. Objective Constitutional Pulse regular. Respirations normal and unlabored. Afebrile. Vitals Time Taken: 10:15 AM, Height: 65 in, Weight: 156 lbs, BMI: 26, Temperature: 98.0 F, Pulse: 79 bpm, Respiratory Rate: 17 breaths/min, Blood Pressure: 124/61 mmHg. Eyes Nonicteric. Reactive to light. Ears,  Nose, Mouth, and Throat Lips, teeth, and gums WNL.Marland Kitchen Moist mucosa without lesions. Neck supple and nontender. No palpable supraclavicular or cervical adenopathy. Normal sized without goiter. Respiratory WNL. No retractions.. Breath sounds WNL, No rubs, rales, rhonchi, or wheeze.. Cardiovascular Heart rhythm and rate regular, no murmur or gallop.. Pedal Pulses WNL. No clubbing, cyanosis or edema. Chest Breasts symmetical and no nipple discharge.. Breast tissue WNL, no masses, lumps, or tenderness.[name] ([id number]) Lymphatic No adneopathy. No adenopathy. No adenopathy. Musculoskeletal Adexa without tenderness or enlargement.. Digits and nails w/o clubbing, cyanosis, infection, petechiae, ischemia, or inflammatory conditions.Marland Kitchen Psychiatric Judgement and insight Intact.. No evidence of depression, anxiety, or agitation.. General Notes: there is some eschar which was gently removed but her nearly a PICC line skin is so fragile that a little bit of washing with saline and the skin just sloughs off Integumentary (Hair, Skin) No suspicious lesions. No crepitus or fluctuance. No peri-wound warmth or erythema. No masses.. Wound #1 status is Open. Original cause of wound was Blister. The wound is located on the Right Back. The wound measures 4.5cm length x 4cm width x 0.1cm depth; 14.137cm^2 area and 1.414cm^3 volume. The wound is limited to skin breakdown. There is no tunneling or undermining noted. There is a medium amount of sanguinous drainage noted. The wound margin is indistinct and nonvisible. There is large (67- 100%) red granulation within the wound bed. There is no necrotic tissue within the wound bed. The periwound skin appearance exhibited: Moist. The periwound skin appearance did not exhibit: Callus, Crepitus, Excoriation, Fluctuance, Friable, Induration, Localized Edema, Rash, Scarring, Dry/Scaly, Maceration, Atrophie Blanche, Cyanosis, Ecchymosis, Hemosiderin Staining,  Mottled, Pallor, Rubor, Erythema. Periwound temperature was noted as No Abnormality. Assessment Active Problems ICD-10 E11.622 - Type 2 diabetes mellitus with other skin ulcer L89.112 - Pressure ulcer of right upper back, stage 2 S80.821A - Blister (nonthermal), right lower leg, initial encounter N18.6 - End stage renal disease Plan Wound Cleansing: Wound #1 Right Back: [name], [name] ([id number]) Clean wound with Normal Saline. Anesthetic: Wound #1 Right  Back: Topical Lidocaine 4% cream applied to wound bed prior to debridement Skin Barriers/Peri-Wound Care: Wound #1 Right Back: Skin Prep Primary Wound Dressing: Wound #1 Right Back: Other: - Cutimed siltec - SNF RN may change dressing if needed as drainage pass white border with nonadherent pad and bordered foam dressing if siltec sorbact is not available Dressing Change Frequency: Wound #1 Right Back: Change dressing every week - unless saturated Follow-up Appointments: Wound #1 Right Back: Return Appointment in 1 week. This week I recommended Cutimed Sorbact form to be applied and gently taped down. She will change this only if required during the week. Electronic Signature(s) Signed: 09/18/2015 10:[name]:54 AM By: [name], [name] Entered By: [name] on 09/18/2015 10:[name]:54 [place], [name] ([id number]) -------------------------------------------------------------------------------- SuperBill Details Patient Name: FERRAN, CENCI S. Date of Service: 09/18/2015 Medical Record Patient Account Number: [id number] Number: [name], RN, [name], Treating RN: 1955-11-02 [id number] y.o. [name] Date of Birth/Sex: Female) Other Clinician: Primary Care Physician: [name] Treating Elyas Villamor Referring Physician: [name] Physician/Extender: [name]: 38 Diagnosis Coding ICD-10 Codes Code Description E11.622 Type 2 diabetes mellitus with other skin ulcer L89.112 Pressure ulcer of right upper back, stage  2 S80.821A Blister (nonthermal), right lower leg, initial encounter N18.6 End stage renal disease Physician Procedures CPT4 Code: [id number] Description: [id number] - WC PHYS LEVEL 3 - EST PT ICD-10 Description Diagnosis E11.622 Type 2 diabetes mellitus with other skin ulcer L89.112 Pressure ulcer of right upper back, stage 2 S80.821A Blister (nonthermal), right lower leg, initial enc N18.6 End  stage renal disease Modifier: ounter Quantity: 1 Electronic Signature(s) Signed: 09/18/2015 11:00:11 AM By: [name], [name] Entered By: [name] on 09/18/2015 11:00:11

## 2015-09-19 NOTE — Progress Notes (Signed)
[name] ([id number]) Visit Report for 09/18/2015 Arrival Information Details [name], [name]. Date of Service: 09/18/2015 10:00 AM Medical Record Number: [id number] Patient Account Number: [id number] Date of Birth/Sex: [birth year] (60 y.o. Female) Treating RN: [name], RN, [name], [name] Primary Care Physician: [name] Other Clinician: Referring Physician: [name] Treating Physician/Extender: Frann Rider in Treatment: 89 Visit Information History Since Last Visit Added or deleted any medications: No Patient Arrived: Walker Any new allergies or adverse reactions: No Arrival Time: 10:14 Had a fall or experienced change in No Accompanied By: self activities of daily living that may affect Transfer Assistance: None risk of falls: Patient Identification Verified: Yes Signs or symptoms of abuse/neglect since last No Secondary Verification Process Yes visito Completed: Hospitalized since last visit: No Patient Requires Transmission- No Has Dressing in Place as Prescribed: Yes Based Precautions: Pain Present Now: No Patient Has Alerts: Yes Patient Alerts: Patient on Blood Thinner Electronic Signature(s) Signed: 09/18/2015 4:14:00 PM By: Regan Lemming [name], RN Entered By: Regan Lemming on 09/18/2015 10:15:24 Syler, [name] ([id number]) -------------------------------------------------------------------------------- Encounter Discharge Information Details Patient Name: [name], [name] Date of Service: 09/18/2015 10:00 AM Medical Record Number: [id number] Patient Account Number: [id number] Date of Birth/Sex: 1956/01/08 (60 y.o. Female) Treating RN: Baruch Gouty, RN, [name], [name] Primary Care Physician: [name] Other Clinician: Referring Physician: [name] Treating Physician/Extender: Frann Rider in Treatment: 38 Encounter Discharge Information Items Schedule Follow-up Appointment: No Medication Reconciliation completed No and provided to  Patient/Care Shaelyn Decarli: Provided on Clinical Summary of Care: 09/18/2015 Form Type Recipient Paper Patient LD Electronic Signature(s) Signed: 09/18/2015 10:35:26 AM By: Ruthine Dose Entered By: Ruthine Dose on 09/18/2015 10:35:26 Godbolt, [name] ([id number]) -------------------------------------------------------------------------------- Lower Extremity Assessment Details Patient Name: [name], [name] Date of Service: 09/18/2015 10:00 AM Medical Record Number: [id number] Patient Account Number: [id number] Date of Birth/Sex: [birth year] (60 y.o. Female) Treating RN: [name], RN, [name], [name] Primary Care Physician: [name] Other Clinician: Referring Physician: [name] Treating Physician/Extender: Frann Rider in Treatment: 36 Electronic Signature(s) Signed: 09/18/2015 4:14:00 PM By: Regan Lemming [name], RN Entered By: Regan Lemming on 09/18/2015 10:16:15 Lewison, [name] ([id number]) -------------------------------------------------------------------------------- Multi Wound Chart Details [name]. Date of Service: 09/18/2015 10:00 AM Medical Record Number: [id number] Patient Account Number: [id number] Date of Birth/Sex: 02-11-1956 (60 y.o. Female) Treating RN: Baruch Gouty, RN, [name], [name] Primary Care Physician: [name] Other Clinician: Referring Physician: [name] Treating Physician/Extender: Frann Rider in Treatment: 38 Vital Signs Height(in): 65 Pulse(bpm): 79 Weight(lbs): 156 Blood Pressure 124/61 (mmHg): Body Mass Index(BMI): 26 Temperature(F): 98.0 Respiratory Rate 17 (breaths/min): Photos: [1:No Photos] [N/A:N/A] Wound Location: [1:Right Back] [N/A:N/A] Wounding Event: [1:Blister] [N/A:N/A] Primary Etiology: [1:Pressure Ulcer] [N/A:N/A] Comorbid History: [1:Glaucoma, Anemia, Coronary Artery Disease, Hypertension, Type II Diabetes, End Stage Renal Disease, Osteoarthritis, Neuropathy] [N/A:N/A] Date Acquired: [1:10/19/2013]  [N/A:N/A] Weeks of Treatment: [1:38] [N/A:N/A] Wound Status: [1:Open] [N/A:N/A] Measurements L x W x D 4.5x4x0.1 [N/A:N/A] (cm) Area (cm) : [1:14.137] [N/A:N/A] Volume (cm) : [1:1.414] [N/A:N/A] % Reduction in Area: [1:63.30%] [N/A:N/A] % Reduction in Volume: 63.30% [N/A:N/A] Classification: [1:Category/Stage II] [N/A:N/A] Exudate Amount: [1:Medium] [N/A:N/A] Exudate Type: [1:Sanguinous] [N/A:N/A] Exudate Color: [1:red] [N/A:N/A] Wound Margin: [1:Indistinct, nonvisible] [N/A:N/A] Granulation Amount: [1:Large (67-100%)] [N/A:N/A] Granulation Quality: [1:Red, Hyper-granulation] [N/A:N/A] Necrotic Amount: [1:None Present (0%)] [N/A:N/A] Exposed Structures: [1:Fascia: No Fat: No Tendon: No] [N/A:N/A] Muscle: No Joint: No Bone: No Limited to Skin Breakdown Epithelialization: Medium (34-66%) N/A N/A Periwound Skin Texture: Edema: No N/A N/A Excoriation: No Induration: No Callus:  No Crepitus: No Fluctuance: No Friable: No Rash: No Scarring: No Periwound Skin Moist: Yes N/A N/A Moisture: Maceration: No Dry/Scaly: No Periwound Skin Color: Atrophie Blanche: No N/A N/A Cyanosis: No Ecchymosis: No Erythema: No Hemosiderin Staining: No Mottled: No Pallor: No Rubor: No Temperature: No Abnormality N/A N/A Tenderness on No N/A N/A Palpation: Wound Preparation: Ulcer Cleansing: N/A N/A Rinsed/Irrigated with Saline Topical Anesthetic Applied: None Treatment Notes Electronic Signature(s) Signed: 09/18/2015 4:14:00 PM By: Regan Lemming [name], RN Entered By: Regan Lemming on 09/18/2015 10:26:16 Maners, [name] ([id number]) -------------------------------------------------------------------------------- [name] Details [name]. Date of Service: 09/18/2015 10:00 AM Medical Record Number: [id number] Patient Account Number: [id number] Date of Birth/Sex: 01-04-1956 (60 y.o. Female) Treating RN: [name], RN, [name], [name] Primary Care Physician:  [name] Other Clinician: Referring Physician: [name] Treating Physician/Extender: Frann Rider in Treatment: 91 Active Inactive Abuse / Safety / Falls / Self Care Management Nursing Diagnoses: Potential for falls Goals: Patient will remain injury free Date Initiated: 12/23/2014 Goal Status: Active Patient/caregiver will verbalize understanding of skin care regimen Date Initiated: 12/23/2014 Goal Status: Active Patient/caregiver will verbalize/demonstrate measures taken to prevent injury and/or falls Date Initiated: 12/23/2014 Goal Status: Active Patient/caregiver will verbalize/demonstrate understanding of what to do in case of emergency Date Initiated: 12/23/2014 Goal Status: Active Interventions: Assess fall risk on admission and as needed Assess: immobility, friction, shearing, incontinence upon admission and as needed Assess impairment of mobility on admission and as needed per policy Assess self care needs on admission and as needed Provide education on fall prevention Notes: Wound/Skin Impairment Nursing Diagnoses: Impaired tissue integrity Knowledge deficit related to ulceration/compromised skin integrity Goals: Patient/caregiver will verbalize understanding of skin care regimen MARIEKA, BELUE ([id number]) Date Initiated: 12/23/2014 Goal Status: Active Ulcer/skin breakdown will heal within 14 weeks Date Initiated: 12/23/2014 Goal Status: Active Interventions: Assess patient/caregiver ability to obtain necessary supplies Assess patient/caregiver ability to perform ulcer/skin care regimen upon admission and as needed Assess ulceration(s) every visit Provide education on ulcer and skin care Treatment Activities: Skin care regimen initiated : 09/18/2015 Topical wound management initiated : 09/18/2015 Notes: Electronic Signature(s) Signed: 09/18/2015 4:14:00 PM By: Regan Lemming [name], RN Entered By: Regan Lemming on 09/18/2015 10:25:31 Grzywacz, [name]  ([id number]) -------------------------------------------------------------------------------- Pain Assessment Details [name]. Date of Service: 09/18/2015 10:00 AM Medical Record Number: [id number] Patient Account Number: [id number] Date of Birth/Sex: [birth year] (60 y.o. Female) Treating RN: Baruch Gouty, RN, [name], [name] Primary Care Physician: [name] Other Clinician: Referring Physician: [name] Treating Physician/Extender: Frann Rider in Treatment: 38 Active Problems Location of Pain Severity and Description of Pain Patient Has Paino No Site Locations Pain Management and Medication Current Pain Management: Electronic Signature(s) Signed: 09/18/2015 4:14:00 PM By: Regan Lemming [name], RN Entered By: Regan Lemming on 09/18/2015 10:15:31 Wunschel, [name] ([id number]) -------------------------------------------------------------------------------- Wound Assessment Details Patient Name: Colclough, Janeene S. Date of Service: 09/18/2015 10:00 AM Medical Record Number: [id number] Patient Account Number: [id number] Date of Birth/Sex: 16-Jan-1956 (60 y.o. Female) Treating RN: [name], RN, [name], [name] Primary Care Physician: [name] Other Clinician: Referring Physician: [name] Treating Physician/Extender: Frann Rider in Treatment: 38 Wound Status Wound Number: 1 Primary Pressure Ulcer Etiology: Wound Location: Right Back Wound Open Wounding Event: Blister Status: Date Acquired: 10/19/2013 Comorbid Glaucoma, Anemia, Coronary Artery Weeks Of Treatment: 38 History: Disease, Hypertension, Type II Clustered Wound: No Diabetes, End Stage Renal Disease, Osteoarthritis, Neuropathy Photos Photo Uploaded By: Regan Lemming on 09/18/2015 16:09:33 Wound Measurements Length: (  cm) 4.5 Width: (cm) 4 Depth: (cm) 0.1 Area: (cm) 14.137 Volume: (cm) 1.414 % Reduction in Area: 63.3% % Reduction in Volume: 63.3% Epithelialization: Medium (34-66%) Tunneling:  No Undermining: No Wound Description Classification: Category/Stage II Wound Margin: Indistinct, nonvisible Exudate Amount: Medium Exudate Type: Sanguinous Exudate Color: red Foul Odor After Cleansing: No Wound Bed Granulation Amount: Large (67-100%) Exposed Structure Granulation Quality: Red, Hyper-granulation Fascia Exposed: No Necrotic Amount: None Present (0%) Fat Layer Exposed: No Riggle, Vickii S. ([id number]) Tendon Exposed: No Muscle Exposed: No Joint Exposed: No Bone Exposed: No Limited to Skin Breakdown Periwound Skin Texture Texture Color No Abnormalities Noted: No No Abnormalities Noted: No Callus: No Atrophie Blanche: No Crepitus: No Cyanosis: No Excoriation: No Ecchymosis: No Fluctuance: No Erythema: No Friable: No Hemosiderin Staining: No Induration: No Mottled: No Localized Edema: No Pallor: No Rash: No Rubor: No Scarring: No Temperature / Pain Moisture Temperature: No Abnormality No Abnormalities Noted: No Dry / Scaly: No Maceration: No Moist: Yes Wound Preparation Ulcer Cleansing: Rinsed/Irrigated with Saline Topical Anesthetic Applied: None Electronic Signature(s) Signed: 09/18/2015 4:14:00 PM By: Regan Lemming [name], RN Entered By: Regan Lemming on 09/18/2015 10:23:59 Luberto, [name] ([id number]) -------------------------------------------------------------------------------- Vitals Details Patient Name: Abigail Miyamoto, Tynika S. Date of Service: 09/18/2015 10:00 AM Medical Record Number: [id number] Patient Account Number: [id number] Date of Birth/Sex: May 13, 1956 (60 y.o. Female) Treating RN: [name], RN, [name], [name] Primary Care Physician: [name] Other Clinician: Referring Physician: [name] Treating Physician/Extender: Frann Rider in Treatment: 38 Vital Signs Time Taken: 10:15 Temperature (F): 98.0 Height (in): 65 Pulse (bpm): 79 Weight (lbs): 156 Respiratory Rate (breaths/min): 17 Body Mass Index (BMI): 26 Blood Pressure  (mmHg): 124/61 Reference Range: 80 - 120 mg / dl Electronic Signature(s) Signed: 09/18/2015 4:14:00 PM By: Regan Lemming [name], RN Entered By: Regan Lemming on 09/18/2015 10:16:08

## 2015-09-25 ENCOUNTER — Encounter: Payer: Medicare Other | Admitting: Surgery

## 2015-09-25 DIAGNOSIS — E11622 Type 2 diabetes mellitus with other skin ulcer: Secondary | ICD-10-CM | POA: Diagnosis not present

## 2015-09-26 NOTE — Progress Notes (Signed)
ELLAH, DEBLANC ([id number]) Visit Report for 09/25/2015 Arrival Information Details [name], [name]. Date of Service: 09/25/2015 8:45 AM Medical Record Number: [id number] Patient Account Number: [id number] Date of Birth/Sex: 1955-10-10 (60 y.o. Female) Treating RN: [name], RN, [name], [name] Primary Care Physician: [name] Other Clinician: Referring Physician: [name] Treating Physician/Extender: Frann Rider in Treatment: 39 Visit Information History Since Last Visit Added or deleted any medications: No Patient Arrived: Wheel Chair Any new allergies or adverse reactions: No Arrival Time: 08:45 Had a fall or experienced change in No Accompanied By: self activities of daily living that may affect Transfer Assistance: None risk of falls: Patient Identification Verified: Yes Signs or symptoms of abuse/neglect since last No Secondary Verification Process Yes visito Completed: Hospitalized since last visit: No Patient Requires Transmission- No Has Dressing in Place as Prescribed: Yes Based Precautions: Pain Present Now: No Patient Has Alerts: Yes Patient Alerts: Patient on Blood Thinner Electronic Signature(s) Signed: 09/25/2015 1:39:55 PM By: Regan Lemming [name], RN Entered By: Regan Lemming on 09/25/2015 08:46:03 [place], [name] ([id number]) -------------------------------------------------------------------------------- Clinic Level of Care Assessment Details [name]. Date of Service: 09/25/2015 8:45 AM Medical Record Number: [id number] Patient Account Number: [id number] Date of Birth/Sex: 1955/08/08 (60 y.o. Female) Treating RN: [name], RN, [name], [name] Primary Care Physician: [name] Other Clinician: Referring Physician: [name] Treating Physician/Extender: Frann Rider in Treatment: 39 Clinic Level of Care Assessment Items TOOL 4 Quantity Score []  - Use when only an EandM is performed on FOLLOW-UP visit  0 ASSESSMENTS - Nursing Assessment / Reassessment X - Reassessment of Co-morbidities (includes updates in patient status) 1 10 X - Reassessment of Adherence to Treatment Plan 1 5 ASSESSMENTS - Wound and Skin Assessment / Reassessment X - Simple Wound Assessment / Reassessment - one wound 1 5 []  - Complex Wound Assessment / Reassessment - multiple wounds 0 []  - Dermatologic / Skin Assessment (not related to wound area) 0 ASSESSMENTS - Focused Assessment []  - Circumferential Edema Measurements - multi extremities 0 []  - Nutritional Assessment / Counseling / Intervention 0 []  - Lower Extremity Assessment (monofilament, tuning fork, pulses) 0 []  - Peripheral Arterial Disease Assessment (using hand held doppler) 0 ASSESSMENTS - Ostomy and/or Continence Assessment and Care []  - Incontinence Assessment and Management 0 []  - Ostomy Care Assessment and Management (repouching, etc.) 0 PROCESS - Coordination of Care X - Simple Patient / Family Education for ongoing care 1 15 []  - Complex (extensive) Patient / Family Education for ongoing care 0 []  - Staff obtains Programmer, systems, Records, Test Results / Process Orders 0 []  - Staff telephones HHA, Nursing Homes / Clarify orders / etc 0 []  - Routine Transfer to another Facility (non-emergent condition) 0 [name], [name] ([id number]) []  - Routine Hospital Admission (non-emergent condition) 0 []  - New Admissions / Biomedical engineer / Ordering NPWT, Apligraf, etc. 0 []  - Emergency Hospital Admission (emergent condition) 0 []  - Simple Discharge Coordination 0 []  - Complex (extensive) Discharge Coordination 0 PROCESS - Special Needs []  - Pediatric / Minor Patient Management 0 []  - Isolation Patient Management 0 []  - Hearing / Language / Visual special needs 0 []  - Assessment of Community assistance (transportation, D/C planning, etc.) 0 []  - Additional assistance / Altered mentation 0 []  - Support Surface(s) Assessment (bed, cushion, seat, etc.)  0 INTERVENTIONS - Wound Cleansing / Measurement X - Simple Wound Cleansing - one wound 1 5 []  - Complex Wound Cleansing - multiple wounds 0 X -  Wound Imaging (photographs - any number of wounds) 1 5 []  - Wound Tracing (instead of photographs) 0 X - Simple Wound Measurement - one wound 1 5 []  - Complex Wound Measurement - multiple wounds 0 INTERVENTIONS - Wound Dressings X - Small Wound Dressing one or multiple wounds 1 10 []  - Medium Wound Dressing one or multiple wounds 0 []  - Large Wound Dressing one or multiple wounds 0 []  - Application of Medications - topical 0 []  - Application of Medications - injection 0 INTERVENTIONS - Miscellaneous []  - External ear exam 0 [name], [name] ([id number]) []  - Specimen Collection (cultures, biopsies, blood, body fluids, etc.) 0 []  - Specimen(s) / Culture(s) sent or taken to Lab for analysis 0 []  - Patient Transfer (multiple staff / Harrel Lemon Lift / Similar devices) 0 []  - Simple Staple / Suture removal (25 or less) 0 []  - Complex Staple / Suture removal (26 or more) 0 []  - Hypo / Hyperglycemic Management (close monitor of Blood Glucose) 0 []  - Ankle / Brachial Index (ABI) - do not check if billed separately 0 X - Vital Signs 1 5 Has the patient been seen at the hospital within the last three years: Yes Total Score: 65 Level Of Care: New/Established - Level 2 Electronic Signature(s) Signed: 09/25/2015 9:17:01 AM By: Regan Lemming [name], RN Entered By: Regan Lemming on 09/25/2015 09:17:01 [name], [name] ([id number]) -------------------------------------------------------------------------------- Encounter Discharge Information Details [name]. Date of Service: 09/25/2015 8:45 AM Medical Record Number: [id number] Patient Account Number: [id number] Date of Birth/Sex: 1955/08/19 (60 y.o. Female) Treating RN: Baruch Gouty, RN, [name], [name] Primary Care Physician: [name] Other Clinician: Referring Physician: [name] Treating  Physician/Extender: Frann Rider in Treatment: 64 Encounter Discharge Information Items Discharge Pain Level: 0 Discharge Condition: Stable Ambulatory Status: Walker Discharge Destination: Nursing Home Transportation: Other Accompanied By: self Schedule Follow-up Appointment: No Medication Reconciliation completed No and provided to Patient/Care Kemberly Taves: Provided on Clinical Summary of Care: 09/25/2015 Form Type Recipient Paper Patient LD Electronic Signature(s) Signed: 09/25/2015 9:21:57 AM By: Regan Lemming [name], RN Previous Signature: 09/25/2015 9:13:44 AM Version By: Sharon Mt Entered By: Regan Lemming on 09/25/2015 09:21:57 [name], [name] ([id number]) -------------------------------------------------------------------------------- Multi Wound Chart Details [name]. Date of Service: 09/25/2015 8:45 AM Medical Record Number: [id number] Patient Account Number: [id number] Date of Birth/Sex: 02/19/1956 (60 y.o. Female) Treating RN: Baruch Gouty, RN, [name], [name] Primary Care Physician: [name] Other Clinician: Referring Physician: [name] Treating Physician/Extender: Frann Rider in Treatment: 39 Vital Signs Height(in): 65 Pulse(bpm): 100 Weight(lbs): 156 Blood Pressure 134/67 (mmHg): Body Mass Index(BMI): 26 Temperature(F): 98.3 Respiratory Rate 17 (breaths/min): Photos: [1:No Photos] [N/A:N/A] Wound Location: [1:Right Back] [N/A:N/A] Wounding Event: [1:Blister] [N/A:N/A] Primary Etiology: [1:Pressure Ulcer] [N/A:N/A] Comorbid History: [1:Glaucoma, Anemia, Coronary Artery Disease, Hypertension, Type II Diabetes, End Stage Renal Disease, Osteoarthritis, Neuropathy] [N/A:N/A] Date Acquired: [1:10/19/2013] [N/A:N/A] Weeks of Treatment: [1:39] [N/A:N/A] Wound Status: [1:Open] [N/A:N/A] Measurements L x W x D 4x6x0.1 [N/A:N/A] (cm) Area (cm) : [1:18.85] [N/A:N/A] Volume (cm) : [1:1.885] [N/A:N/A] % Reduction in Area: [1:51.00%]  [N/A:N/A] % Reduction in Volume: 51.00% [N/A:N/A] Classification: [1:Category/Stage II] [N/A:N/A] Exudate Amount: [1:Medium] [N/A:N/A] Exudate Type: [1:Sanguinous] [N/A:N/A] Exudate Color: [1:red] [N/A:N/A] Wound Margin: [1:Indistinct, nonvisible] [N/A:N/A] Granulation Amount: [1:Large (67-100%)] [N/A:N/A] Granulation Quality: [1:Red, Hyper-granulation] [N/A:N/A] Necrotic Amount: [1:None Present (0%)] [N/A:N/A] Exposed Structures: [1:Fascia: No Fat: No Tendon: No] [N/A:N/A] Muscle: No Joint: No Bone: No Limited to Skin Breakdown Epithelialization: Medium (34-66%) N/A N/A Periwound Skin Texture: Edema:  No N/A N/A Excoriation: No Induration: No Callus: No Crepitus: No Fluctuance: No Friable: No Rash: No Scarring: No Periwound Skin Moist: Yes N/A N/A Moisture: Maceration: No Dry/Scaly: No Periwound Skin Color: Atrophie Blanche: No N/A N/A Cyanosis: No Ecchymosis: No Erythema: No Hemosiderin Staining: No Mottled: No Pallor: No Rubor: No Temperature: No Abnormality N/A N/A Tenderness on No N/A N/A Palpation: Wound Preparation: Ulcer Cleansing: N/A N/A Rinsed/Irrigated with Saline Topical Anesthetic Applied: None Treatment Notes Electronic Signature(s) Signed: 09/25/2015 1:39:55 PM By: Regan Lemming [name], RN Entered By: Regan Lemming on 09/25/2015 09:03:40 [name], [name] ([id number]) -------------------------------------------------------------------------------- [place] Details [name]. Date of Service: 09/25/2015 8:45 AM Medical Record Number: [id number] Patient Account Number: [id number] Date of Birth/Sex: [birth year] (60 y.o. Female) Treating RN: [name], RN, [name], [name] Primary Care Physician: [name] Other Clinician: Referring Physician: [name] Treating Physician/Extender: Frann Rider in Treatment: 73 Active Inactive Abuse / Safety / Falls / Self Care Management Nursing Diagnoses: Potential for  falls Goals: Patient will remain injury free Date Initiated: 12/23/2014 Goal Status: Active Patient/caregiver will verbalize understanding of skin care regimen Date Initiated: 12/23/2014 Goal Status: Active Patient/caregiver will verbalize/demonstrate measures taken to prevent injury and/or falls Date Initiated: 12/23/2014 Goal Status: Active Patient/caregiver will verbalize/demonstrate understanding of what to do in case of emergency Date Initiated: 12/23/2014 Goal Status: Active Interventions: Assess fall risk on admission and as needed Assess: immobility, friction, shearing, incontinence upon admission and as needed Assess impairment of mobility on admission and as needed per policy Assess self care needs on admission and as needed Provide education on fall prevention Notes: Wound/Skin Impairment Nursing Diagnoses: Impaired tissue integrity Knowledge deficit related to ulceration/compromised skin integrity Goals: Patient/caregiver will verbalize understanding of skin care regimen [name], [name] ([id number]) Date Initiated: 12/23/2014 Goal Status: Active Ulcer/skin breakdown will heal within 14 weeks Date Initiated: 12/23/2014 Goal Status: Active Interventions: Assess patient/caregiver ability to obtain necessary supplies Assess patient/caregiver ability to perform ulcer/skin care regimen upon admission and as needed Assess ulceration(s) every visit Provide education on ulcer and skin care Treatment Activities: Skin care regimen initiated : 09/25/2015 Topical wound management initiated : 09/25/2015 Notes: Electronic Signature(s) Signed: 09/25/2015 1:39:55 PM By: Regan Lemming [name], RN Entered By: Regan Lemming on 09/25/2015 09:03:32 [name], [name] ([id number]) -------------------------------------------------------------------------------- Patient/Caregiver Education Details [name]. Date of Service: 09/25/2015 8:45 AM Medical Record Number: [id number] Patient  Account Number: [id number] Date of Birth/Gender: Nov 21, 1955 (60 y.o. Female) Treating RN: Baruch Gouty, RN, [name], [name] Primary Care Physician: [name] Other Clinician: Referring Physician: [name] Treating Physician/Extender: Frann Rider in Treatment: 66 Education Assessment Education Provided To: Patient Education Topics Provided Safety: Methods: Explain/Verbal Responses: State content correctly Wound/Skin Impairment: Methods: Explain/Verbal Responses: State content correctly Electronic Signature(s) Signed: 09/25/2015 9:22:25 AM By: Regan Lemming [name], RN Entered By: Regan Lemming on 09/25/2015 09:22:25 [place], [name] ([id number]) -------------------------------------------------------------------------------- Wound Assessment Details [name], [name] Date of Service: 09/25/2015 8:45 AM Medical Record Number: [id number] Patient Account Number: [id number] Date of Birth/Sex: [birth year] (60 y.o. Female) Treating RN: [name], RN, [name], [name] Primary Care Physician: [name] Other Clinician: Referring Physician: [name] Treating Physician/Extender: Frann Rider in Treatment: 39 Wound Status Wound Number: 1 Primary Pressure Ulcer Etiology: Wound Location: Right Back Wound Open Wounding Event: Blister Status: Date Acquired: 10/19/2013 Comorbid Glaucoma, Anemia, Coronary Artery Weeks Of Treatment: 39 History: Disease, Hypertension, Type II Clustered Wound: No Diabetes, End Stage Renal Disease, Osteoarthritis, Neuropathy Photos Photo Uploaded  By: Regan Lemming on 09/25/2015 15:25:21 Wound Measurements Length: (cm) 4 Width: (cm) 6 Depth: (cm) 0.1 Area: (cm) 18.85 Volume: (cm) 1.885 % Reduction in Area: 51% % Reduction in Volume: 51% Epithelialization: Medium (34-66%) Tunneling: No Undermining: No Wound Description Classification: Category/Stage II Wound Margin: Indistinct, nonvisible Exudate Amount: Medium Exudate Type:  Sanguinous Exudate Color: red Foul Odor After Cleansing: No Wound Bed Granulation Amount: Large (67-100%) Exposed Structure Granulation Quality: Red, Hyper-granulation Fascia Exposed: No Necrotic Amount: None Present (0%) Fat Layer Exposed: No [name], [name] ([id number]) Tendon Exposed: No Muscle Exposed: No Joint Exposed: No Bone Exposed: No Limited to Skin Breakdown Periwound Skin Texture Texture Color No Abnormalities Noted: No No Abnormalities Noted: No Callus: No Atrophie Blanche: No Crepitus: No Cyanosis: No Excoriation: No Ecchymosis: No Fluctuance: No Erythema: No Friable: No Hemosiderin Staining: No Induration: No Mottled: No Localized Edema: No Pallor: No Rash: No Rubor: No Scarring: No Temperature / Pain Moisture Temperature: No Abnormality No Abnormalities Noted: No Dry / Scaly: No Maceration: No Moist: Yes Wound Preparation Ulcer Cleansing: Rinsed/Irrigated with Saline Topical Anesthetic Applied: None Treatment Notes Wound #1 (Right Back) 1. Cleansed with: Clean wound with Normal Saline 3. Peri-wound Care: Skin Prep 4. Dressing Applied: Other dressing (specify in notes) 7. Secured with Tape Notes cutimed Psychologist, forensic) Signed: 09/25/2015 1:39:55 PM By: Regan Lemming [name], RN Entered By: Regan Lemming on 09/25/2015 08:52:57 [place], [name] ([id number]) -------------------------------------------------------------------------------- Vitals Details [name]. Date of Service: 09/25/2015 8:45 AM Medical Record Number: [id number] Patient Account Number: [id number] Date of Birth/Sex: 09-23-1955 (60 y.o. Female) Treating RN: [name], RN, [name], [name] Primary Care Physician: [name] Other Clinician: Referring Physician: [name] Treating Physician/Extender: Frann Rider in Treatment: 39 Vital Signs Time Taken: 08:46 Temperature (F): 98.3 Height (in): 65 Pulse (bpm): 100 Weight (lbs):  156 Respiratory Rate (breaths/min): 17 Body Mass Index (BMI): 26 Blood Pressure (mmHg): 134/67 Reference Range: 80 - 120 mg / dl Electronic Signature(s) Signed: 09/25/2015 1:39:55 PM By: Regan Lemming [name], RN Entered By: Regan Lemming on 09/25/2015 08:48:20

## 2015-09-26 NOTE — Progress Notes (Signed)
CHELLIE, PERILLI ([id number]) Visit Report for 09/25/2015 Chief Complaint Document Details [name], [name]. Date of Service: 09/25/2015 8:45 AM Medical Record Patient Account Number: [id number] Number: [name], RN, [name], Treating RN: 1955-08-04 (60 y.o. [name] Date of Birth/Sex: Female) Other Clinician: Primary Care Physician: Brynda Greathouse, ERNEST Treating Wisdom Seybold Referring Physician: [name] Physician/Extender: [name]: 39 Information Obtained from: Patient Chief Complaint Patient presents to the wound care center for a consult due non healing wound. She has an open wound on her right upper back which she's had for about a year and she recently noticed a blister on her right lower extremity about 2 weeks ago. Electronic Signature(s) Signed: 09/25/2015 9:12:09 AM By: [name], [name] Entered By: [name] on 09/25/2015 09:12:09 Bonnette, [name] ([id number]) -------------------------------------------------------------------------------- HPI Details [name], [name]. Date of Service: 09/25/2015 8:45 AM Medical Record Patient Account Number: [id number] Number: [name], RN, [name], Treating RN: [birth year] (60 y.o. [name] Date of Birth/Sex: Female) Other Clinician: Primary Care Physician: Brynda Greathouse, ERNEST Treating Faust [name] Referring Physician: [name] Physician/Extender: [name]: 39 History of Present Illness Location: right upper back and right lower extremity wounds Quality: Patient reports No Pain. Severity: Patient states wound (s) are getting better. Duration: Patient has had the wound for > 3 months prior to seeking treatment at the wound center Timing: she thought it first occurred when she was using a heating pad about a year ago after back surgery. Context: The wound appeared gradually over time Modifying Factors: Patient is currently on renal dialysis and receives treatments 3 times  weekly Associated Signs and Symptoms: Patient reports having: surgery scheduled for this week for a AV fistula left arm. HPI Description: 60 year old patient who is known to be a diabetic and has end-stage renal disease has had several comorbidities including coronary artery disease, hypertension, hyperlipidemia, pancreatitis, anemia, previous history of hysterectomy, cholecystectomy, left-sided salivary gland excision, bilateral cataract surgery,Peritoneal dialysis catheter, hemodialysis catheter. the area on the back has also been caused by instant pressure she used to sleep on a recliner all day and has significant kyphoscoliosis. As far as the wound on her right lower extremity she's not sure how this blister occurred but she thought it has been there for about 2 weeks. No recent blood investigations available and no recent hemoglobin A1c. 12/30/2014 -- she is an assisted living facility but I believe the nurses that have not followed instructions as she had some cream applied on her back and there was a different dressing. Last week she's had a AV fistula placed on her left forearm. 01/13/2015 -- she has had some localized infection at the port site and she's been on doxycycline for this. 02/05/2015 - he has developed a small blister on her right lower extremity. 02/10/2015 -- she has developed another small blister on her right anterior chest wall in the area where she's had tape for her dialysis access. this may just be injury caused by a tape burn. 02/18/2015 -- no new blisters and she had a dermatology opinion and they have taken a biopsy of her skin. She also had a left brachial AV fistula placed this week. 03/03/2015 -- though we do not have the pathology report yet the patient says she has been put on prednisone because this skin disease is possibly to do with her immune system and her dermatologist is recommended this. 03/10/2015 -- she has developed a new blister which is quite  large on her right lower  extremity on the shin. 05/22/2015 -- she did very well with her dressing but on trying to remove the [place] it peels away Schiffer, Beverlyann S. ([id number]) the newly healed skin. 06/05/2015 -- the patient was in the ER this past week for severe bleeding from the right nostril and had to have ENT see her and do a packing. They've also been holding her heparin during her dialysis. The patient is going back to ENT today. Other than that she has had no other significant issues. 06/19/2015 -- she is back on her blood thinners and continues to have her hemodialysis as before. 07/31/2015 -- a large blister has popped up again on her right lower extremity in the same place in the mid shin area in the anterior lateral compartment. Electronic Signature(s) Signed: 09/25/2015 9:12:14 AM By: [name], [name] Entered By: [name] on 09/25/2015 09:12:14 Mull, [name] ([id number]) -------------------------------------------------------------------------------- Physical Exam Details [name], [name] Date of Service: 09/25/2015 8:45 AM Medical Record Patient Account Number: [id number] Number: [name], RN, [name], Treating RN: 1955-11-11 (60 y.o. [name] Date of Birth/Sex: Female) Other Clinician: Primary Care Physician: Brynda Greathouse, ERNEST Treating Suprena Travaglini Referring Physician: [name] Physician/Extender: [name]: 39 Constitutional . Pulse regular. Respirations normal and unlabored. Afebrile. . Eyes Nonicteric. Reactive to light. Ears, Nose, Mouth, and Throat Lips, teeth, and gums WNL.Marland Kitchen Moist mucosa without lesions. Neck supple and nontender. No palpable supraclavicular or cervical adenopathy. Normal sized without goiter. Respiratory WNL. No retractions.. Cardiovascular Pedal Pulses WNL. No clubbing, cyanosis or edema. Lymphatic No adneopathy. No adenopathy. No adenopathy. Musculoskeletal Adexa without tenderness or  enlargement.. Digits and nails w/o clubbing, cyanosis, infection, petechiae, ischemia, or inflammatory conditions.. Integumentary (Hair, Skin) No suspicious lesions. No crepitus or fluctuance. No peri-wound warmth or erythema. No masses.Marland Kitchen Psychiatric Judgement and insight Intact.. No evidence of depression, anxiety, or agitation.. Notes the wound looks excellent today and she has got some epithelialization and though the skin is fragile we will protect it with Siltec Sorbact foam Electronic Signature(s) Signed: 09/25/2015 9:13:09 AM By: [name], [name] Entered By: [name] on 09/25/2015 09:13:09 Sebring, [name] ([id number]) -------------------------------------------------------------------------------- Physician Orders Details Patient Name: [name] Date of Service: 09/25/2015 8:45 AM Medical Record Patient Account Number: [id number] Number: [name], RN, [name], Treating RN: 02/06/1956 (60 y.o. [name] Date of Birth/Sex: Female) Other Clinician: Primary Care Physician: Brynda Greathouse, ERNEST Treating Misk Galentine Referring Physician: [name] Physician/Extender: Suella [place] in Treatment: 20 Verbal / Phone Orders: Yes Clinician: [name], RN, [name], [name] Read Back and Verified: Yes Diagnosis Coding Wound Cleansing Wound #1 Right Back o Clean wound with Normal Saline. Anesthetic Wound #1 Right Back o Topical Lidocaine 4% cream applied to wound bed prior to debridement Skin Barriers/Peri-Wound Care Wound #1 Right Back o Skin Prep Primary Wound Dressing Wound #1 Right Back o Other: - Cutimed siltec - SNF RN may change dressing if needed as drainage pass white border. Extra piece given to patient Dressing Change Frequency Wound #1 Right Back o Change dressing every week - unless saturated. Gently pull of old dressing due to patient thin epetheilized skin Follow-up Appointments Wound #1 Right Back o Return Appointment in 1 week. Electronic  Signature(s) Signed: 09/25/2015 9:11:25 AM By: Regan Lemming [name], RN Signed: 09/25/2015 3:47:35 PM By: [name], [name] Entered By: Regan Lemming on 09/25/2015 09:11:25 Matchett, [name] ([id number]) -------------------------------------------------------------------------------- Problem List Details [name], [name]. Date of Service: 09/25/2015 8:45 AM Medical Record Patient Account Number:  [id number] Number: [name], RN, [name], Treating RN: [birth year] [phone] y.o. [name] Date of Birth/Sex: Female) Other Clinician: Primary Care Physician: [name] Treating Diarra Kos Referring Physician: [name] Physician/Extender: [name]: 39 Active Problems ICD-10 Encounter Code Description Active Date Diagnosis E11.622 Type 2 diabetes mellitus with other skin ulcer 12/23/2014 Yes L89.112 Pressure ulcer of right upper back, stage 2 12/23/2014 Yes S80.821A Blister (nonthermal), right lower leg, initial encounter 12/23/2014 Yes N18.6 End stage renal disease 12/23/2014 Yes Inactive Problems Resolved Problems Electronic Signature(s) Signed: 09/25/2015 9:12:03 AM By: [name], [name] Entered By: [name] on 09/25/2015 09:12:03 Seki, [name] ([id number]) -------------------------------------------------------------------------------- Progress Note Details Patient Name: Santone, Camarie S. Date of Service: 09/25/2015 8:45 AM Medical Record Patient Account Number: [id number] Number: [name], RN, [name], Treating RN: 1955-09-12 (60 y.o. [name] Date of Birth/Sex: Female) Other Clinician: Primary Care Physician: Brynda Greathouse, ERNEST Treating Crytal Pensinger Referring Physician: [name] Physician/Extender: [name]: 39 Subjective Chief Complaint Information obtained from Patient Patient presents to the wound care center for a consult due non healing wound. She has an open wound on her right upper back which she's had for about a year and she recently  noticed a blister on her right lower extremity about 2 weeks ago. History of Present Illness (HPI) The following HPI elements were documented for the patient's wound: Location: right upper back and right lower extremity wounds Quality: Patient reports No Pain. Severity: Patient states wound (s) are getting better. Duration: Patient has had the wound for > 3 months prior to seeking treatment at the wound center Timing: she thought it first occurred when she was using a heating pad about a year ago after back surgery. Context: The wound appeared gradually over time Modifying Factors: Patient is currently on renal dialysis and receives treatments 3 times weekly Associated Signs and Symptoms: Patient reports having: surgery scheduled for this week for a AV fistula left arm. 60 year old patient who is known to be a diabetic and has end-stage renal disease has had several comorbidities including coronary artery disease, hypertension, hyperlipidemia, pancreatitis, anemia, previous history of hysterectomy, cholecystectomy, left-sided salivary gland excision, bilateral cataract surgery,Peritoneal dialysis catheter, hemodialysis catheter. the area on the back has also been caused by instant pressure she used to sleep on a recliner all day and has significant kyphoscoliosis. As far as the wound on her right lower extremity she's not sure how this blister occurred but she thought it has been there for about 2 weeks. No recent blood investigations available and no recent hemoglobin A1c. 12/30/2014 -- she is an assisted living facility but I believe the nurses that have not followed instructions as she had some cream applied on her back and there was a different dressing. Last week she's had a AV fistula placed on her left forearm. 01/13/2015 -- she has had some localized infection at the port site and she's been on doxycycline for this. 02/05/2015 - he has developed a small blister on her right lower  extremity. 02/10/2015 -- she has developed another small blister on her right anterior chest wall in the area where she's had tape for her dialysis access. this may just be injury caused by a tape burn. MECHELE, LOVINGER ([id number]) 02/18/2015 -- no new blisters and she had a dermatology opinion and they have taken a biopsy of her skin. She also had a left brachial AV fistula placed this week. 03/03/2015 -- though we do not have the pathology report yet the patient says she  has been put on prednisone because this skin disease is possibly to do with her immune system and her dermatologist is recommended this. 03/10/2015 -- she has developed a new blister which is quite large on her right lower extremity on the shin. 05/22/2015 -- she did very well with her dressing but on trying to remove the [place] it peels away the newly healed skin. 06/05/2015 -- the patient was in the ER this past week for severe bleeding from the right nostril and had to have ENT see her and do a packing. They've also been holding her heparin during her dialysis. The patient is going back to ENT today. Other than that she has had no other significant issues. 06/19/2015 -- she is back on her blood thinners and continues to have her hemodialysis as before. 07/31/2015 -- a large blister has popped up again on her right lower extremity in the same place in the mid shin area in the anterior lateral compartment. Objective Constitutional Pulse regular. Respirations normal and unlabored. Afebrile. Vitals Time Taken: 8:46 AM, Height: 65 in, Weight: 156 lbs, BMI: 26, Temperature: 98.3 F, Pulse: 100 bpm, Respiratory Rate: 17 breaths/min, Blood Pressure: 134/67 mmHg. Eyes Nonicteric. Reactive to light. Ears, Nose, Mouth, and Throat Lips, teeth, and gums WNL.Marland Kitchen Moist mucosa without lesions. Neck supple and nontender. No palpable supraclavicular or cervical adenopathy. Normal sized without goiter. Respiratory WNL. No  retractions.. Cardiovascular Pedal Pulses WNL. No clubbing, cyanosis or edema. Lymphatic No adneopathy. No adenopathy. No adenopathy. [name] ([id number]) Musculoskeletal Adexa without tenderness or enlargement.. Digits and nails w/o clubbing, cyanosis, infection, petechiae, ischemia, or inflammatory conditions.Marland Kitchen Psychiatric Judgement and insight Intact.. No evidence of depression, anxiety, or agitation.. General Notes: the wound looks excellent today and she has got some epithelialization and though the skin is fragile we will protect it with Siltec Sorbact foam Integumentary (Hair, Skin) No suspicious lesions. No crepitus or fluctuance. No peri-wound warmth or erythema. No masses.. Wound #1 status is Open. Original cause of wound was Blister. The wound is located on the Right Back. The wound measures 4cm length x 6cm width x 0.1cm depth; 18.85cm^2 area and 1.885cm^3 volume. The wound is limited to skin breakdown. There is no tunneling or undermining noted. There is a medium amount of sanguinous drainage noted. The wound margin is indistinct and nonvisible. There is large (67-100%) red granulation within the wound bed. There is no necrotic tissue within the wound bed. The periwound skin appearance exhibited: Moist. The periwound skin appearance did not exhibit: Callus, Crepitus, Excoriation, Fluctuance, Friable, Induration, Localized Edema, Rash, Scarring, Dry/Scaly, Maceration, Atrophie Blanche, Cyanosis, Ecchymosis, Hemosiderin Staining, Mottled, Pallor, Rubor, Erythema. Periwound temperature was noted as No Abnormality. Assessment Active Problems ICD-10 E11.622 - Type 2 diabetes mellitus with other skin ulcer L89.112 - Pressure ulcer of right upper back, stage 2 S80.821A - Blister (nonthermal), right lower leg, initial encounter N18.6 - End stage renal disease Plan Wound Cleansing: Wound #1 Right Back: Clean wound with Normal Saline. Anesthetic: Wound #1 Right  Back: WANDALYN, RIVENBURGH ([id number]) Topical Lidocaine 4% cream applied to wound bed prior to debridement Skin Barriers/Peri-Wound Care: Wound #1 Right Back: Skin Prep Primary Wound Dressing: Wound #1 Right Back: Other: - Cutimed siltec - SNF RN may change dressing if needed as drainage pass white border. Extra piece given to patient Dressing Change Frequency: Wound #1 Right Back: Change dressing every week - unless saturated. Gently pull of old dressing due to patient thin epetheilized skin Follow-up Appointments: Wound #  1 Right Back: Return Appointment in 1 week. The wound looks excellent today and she has got some epithelialization and though the skin is fragile we will protect it with Siltec Sorbact foam. details of the dressing have been discussed with her especially for her home care nurses if they do have to change the dressing. Electronic Signature(s) Signed: 09/25/2015 9:13:39 AM By: [name], [name] Entered By: [name] on 09/25/2015 09:13:39 Hagan, [name] ([id number]) -------------------------------------------------------------------------------- SuperBill Details [name], [name] Date of Service: 09/25/2015 Medical Record Patient Account Number: [id number] Number: [name], RN, [name], Treating RN: 07-20-1955 [phone] y.o. [name] Date of Birth/Sex: Female) Other Clinician: Primary Care Physician: [name] Treating Quinzell Malcomb Referring Physician: [name] Physician/Extender: [name]: 39 Diagnosis Coding ICD-10 Codes Code Description E11.622 Type 2 diabetes mellitus with other skin ulcer L89.112 Pressure ulcer of right upper back, stage 2 S80.821A Blister (nonthermal), right lower leg, initial encounter N18.6 End stage renal disease Facility Procedures CPT4 Code: [id number] Description: [phone] - WOUND CARE VISIT-LEV 2 EST PT Modifier: Quantity: 1 Physician Procedures CPT4 Code: [id number] Description: [id number] - WC PHYS  LEVEL 3 - EST PT ICD-10 Description Diagnosis E11.622 Type 2 diabetes mellitus with other skin ulcer L89.112 Pressure ulcer of right upper back, stage 2 S80.821A Blister (nonthermal), right lower leg, initial enc N18.6 End  stage renal disease Modifier: ounter Quantity: 1 Electronic Signature(s) Signed: 09/25/2015 9:17:25 AM By: Regan Lemming [name], RN Signed: 09/25/2015 3:47:35 PM By: [name], [name] Previous Signature: 09/25/2015 9:13:53 AM Version By: [name], [name] Entered By: Regan Lemming on 09/25/2015 09:17:25

## 2015-09-29 ENCOUNTER — Inpatient Hospital Stay
Admission: EM | Admit: 2015-09-29 | Discharge: 2015-10-02 | DRG: 190 | Disposition: A | Discharge status: Skilled Nursing Facility | Payer: Medicare Other | Attending: Internal Medicine | Admitting: Internal Medicine

## 2015-09-29 ENCOUNTER — Encounter: Payer: Self-pay | Admitting: *Deleted

## 2015-09-29 ENCOUNTER — Emergency Department: Payer: Medicare Other

## 2015-09-29 DIAGNOSIS — Z8249 Family history of ischemic heart disease and other diseases of the circulatory system: Secondary | ICD-10-CM

## 2015-09-29 DIAGNOSIS — I252 Old myocardial infarction: Secondary | ICD-10-CM | POA: Diagnosis not present

## 2015-09-29 DIAGNOSIS — N2581 Secondary hyperparathyroidism of renal origin: Secondary | ICD-10-CM | POA: Diagnosis present

## 2015-09-29 DIAGNOSIS — K219 Gastro-esophageal reflux disease without esophagitis: Secondary | ICD-10-CM | POA: Diagnosis present

## 2015-09-29 DIAGNOSIS — E1143 Type 2 diabetes mellitus with diabetic autonomic (poly)neuropathy: Secondary | ICD-10-CM | POA: Diagnosis present

## 2015-09-29 DIAGNOSIS — E1142 Type 2 diabetes mellitus with diabetic polyneuropathy: Secondary | ICD-10-CM | POA: Diagnosis present

## 2015-09-29 DIAGNOSIS — K920 Hematemesis: Secondary | ICD-10-CM | POA: Diagnosis present

## 2015-09-29 DIAGNOSIS — Z91013 Allergy to seafood: Secondary | ICD-10-CM | POA: Diagnosis not present

## 2015-09-29 DIAGNOSIS — Z881 Allergy status to other antibiotic agents status: Secondary | ICD-10-CM | POA: Diagnosis not present

## 2015-09-29 DIAGNOSIS — Z7982 Long term (current) use of aspirin: Secondary | ICD-10-CM | POA: Diagnosis not present

## 2015-09-29 DIAGNOSIS — L120 Bullous pemphigoid: Secondary | ICD-10-CM | POA: Diagnosis present

## 2015-09-29 DIAGNOSIS — Z888 Allergy status to other drugs, medicaments and biological substances status: Secondary | ICD-10-CM

## 2015-09-29 DIAGNOSIS — Z955 Presence of coronary angioplasty implant and graft: Secondary | ICD-10-CM

## 2015-09-29 DIAGNOSIS — Z79899 Other long term (current) drug therapy: Secondary | ICD-10-CM | POA: Diagnosis not present

## 2015-09-29 DIAGNOSIS — I4891 Unspecified atrial fibrillation: Secondary | ICD-10-CM | POA: Diagnosis present

## 2015-09-29 DIAGNOSIS — Z90710 Acquired absence of both cervix and uterus: Secondary | ICD-10-CM | POA: Diagnosis not present

## 2015-09-29 DIAGNOSIS — Z7952 Long term (current) use of systemic steroids: Secondary | ICD-10-CM | POA: Diagnosis not present

## 2015-09-29 DIAGNOSIS — H548 Legal blindness, as defined in USA: Secondary | ICD-10-CM | POA: Diagnosis present

## 2015-09-29 DIAGNOSIS — Z833 Family history of diabetes mellitus: Secondary | ICD-10-CM

## 2015-09-29 DIAGNOSIS — I2510 Atherosclerotic heart disease of native coronary artery without angina pectoris: Secondary | ICD-10-CM | POA: Diagnosis present

## 2015-09-29 DIAGNOSIS — D631 Anemia in chronic kidney disease: Secondary | ICD-10-CM | POA: Diagnosis present

## 2015-09-29 DIAGNOSIS — R531 Weakness: Secondary | ICD-10-CM

## 2015-09-29 DIAGNOSIS — Z992 Dependence on renal dialysis: Secondary | ICD-10-CM

## 2015-09-29 DIAGNOSIS — J440 Chronic obstructive pulmonary disease with acute lower respiratory infection: Principal | ICD-10-CM | POA: Diagnosis present

## 2015-09-29 DIAGNOSIS — R1030 Lower abdominal pain, unspecified: Secondary | ICD-10-CM | POA: Diagnosis present

## 2015-09-29 DIAGNOSIS — Z7902 Long term (current) use of antithrombotics/antiplatelets: Secondary | ICD-10-CM | POA: Diagnosis not present

## 2015-09-29 DIAGNOSIS — I130 Hypertensive heart and chronic kidney disease with heart failure and stage 1 through stage 4 chronic kidney disease, or unspecified chronic kidney disease: Secondary | ICD-10-CM | POA: Diagnosis present

## 2015-09-29 DIAGNOSIS — T2101XS Burn of unspecified degree of chest wall, sequela: Secondary | ICD-10-CM | POA: Diagnosis not present

## 2015-09-29 DIAGNOSIS — H409 Unspecified glaucoma: Secondary | ICD-10-CM | POA: Diagnosis present

## 2015-09-29 DIAGNOSIS — Z794 Long term (current) use of insulin: Secondary | ICD-10-CM | POA: Diagnosis not present

## 2015-09-29 DIAGNOSIS — E785 Hyperlipidemia, unspecified: Secondary | ICD-10-CM | POA: Diagnosis present

## 2015-09-29 DIAGNOSIS — N186 End stage renal disease: Secondary | ICD-10-CM | POA: Diagnosis present

## 2015-09-29 DIAGNOSIS — E11319 Type 2 diabetes mellitus with unspecified diabetic retinopathy without macular edema: Secondary | ICD-10-CM | POA: Diagnosis present

## 2015-09-29 DIAGNOSIS — J189 Pneumonia, unspecified organism: Secondary | ICD-10-CM | POA: Diagnosis not present

## 2015-09-29 DIAGNOSIS — E1122 Type 2 diabetes mellitus with diabetic chronic kidney disease: Secondary | ICD-10-CM | POA: Diagnosis present

## 2015-09-29 DIAGNOSIS — I509 Heart failure, unspecified: Secondary | ICD-10-CM | POA: Diagnosis present

## 2015-09-29 LAB — CREATININE, SERUM
Creatinine, Ser: 3.31 mg/dL — ABNORMAL HIGH (ref 0.44–1.00)
GFR calc non Af Amer: 14 mL/min — ABNORMAL LOW (ref >60)
GFR, EST AFRICAN AMERICAN: 16 mL/min — AB (ref >60)

## 2015-09-29 LAB — COMPREHENSIVE METABOLIC PANEL
ALBUMIN: 3.3 g/dL — AB (ref 3.5–5.0)
ALT: 25 U/L (ref 14–54)
ANION GAP: 10 (ref 5–15)
AST: 25 U/L (ref 15–41)
Alkaline Phosphatase: 82 U/L (ref 38–126)
BUN: 20 mg/dL (ref 6–20)
CHLORIDE: 93 mmol/L — AB (ref 101–111)
CO2: 31 mmol/L (ref 22–32)
Calcium: 8.2 mg/dL — ABNORMAL LOW (ref 8.9–10.3)
Creatinine, Ser: 2.94 mg/dL — ABNORMAL HIGH (ref 0.44–1.00)
GFR calc Af Amer: 19 mL/min — ABNORMAL LOW (ref >60)
GFR calc non Af Amer: 16 mL/min — ABNORMAL LOW (ref >60)
GLUCOSE: 199 mg/dL — AB (ref 65–99)
POTASSIUM: 3.2 mmol/L — AB (ref 3.5–5.1)
SODIUM: 134 mmol/L — AB (ref 135–145)
Total Bilirubin: 0.9 mg/dL (ref 0.3–1.2)
Total Protein: 7.3 g/dL (ref 6.5–8.1)

## 2015-09-29 LAB — CBC
HCT: 29.8 % — ABNORMAL LOW (ref 35.0–47.0)
HEMATOCRIT: 29.8 % — AB (ref 35.0–47.0)
Hemoglobin: 9.7 g/dL — ABNORMAL LOW (ref 12.0–16.0)
Hemoglobin: 9.7 g/dL — ABNORMAL LOW (ref 12.0–16.0)
MCH: 28.9 pg (ref 26.0–34.0)
MCH: 29.4 pg (ref 26.0–34.0)
MCHC: 32.6 g/dL (ref 32.0–36.0)
MCHC: 32.6 g/dL (ref 32.0–36.0)
MCV: 88.6 fL (ref 80.0–100.0)
MCV: 90.3 fL (ref 80.0–100.0)
PLATELETS: 181 10*3/uL (ref 150–440)
PLATELETS: 176 10*3/uL (ref 150–440)
RBC: 3.36 MIL/uL — ABNORMAL LOW (ref 3.80–5.20)
RBC: 3.3 MIL/uL — AB (ref 3.80–5.20)
RDW: 18.1 % — AB (ref 11.5–14.5)
RDW: 18.2 % — ABNORMAL HIGH (ref 11.5–14.5)
WBC: 12.2 10*3/uL — AB (ref 3.6–11.0)
WBC: 12.2 10*3/uL — AB (ref 3.6–11.0)

## 2015-09-29 LAB — URINALYSIS COMPLETE WITH MICROSCOPIC (ARMC ONLY)
BILIRUBIN URINE: NEGATIVE
Glucose, UA: 150 mg/dL — AB
LEUKOCYTES UA: NEGATIVE
Nitrite: NEGATIVE
PH: 7 (ref 5.0–8.0)
Protein, ur: >500 mg/dL — AB
Specific Gravity, Urine: 1.01 (ref 1.005–1.030)

## 2015-09-29 LAB — INFLUENZA PANEL BY PCR (TYPE A & B)
H1N1FLUPCR: NOT DETECTED
Influenza A By PCR: NEGATIVE
Influenza B By PCR: NEGATIVE

## 2015-09-29 LAB — GLUCOSE, CAPILLARY
GLUCOSE-CAPILLARY: 256 mg/dL — AB (ref 65–99)
Glucose-Capillary: 217 mg/dL — ABNORMAL HIGH (ref 65–99)

## 2015-09-29 LAB — TROPONIN I
TROPONIN I: 0.21 ng/mL — AB (ref <0.031)
TROPONIN I: 0.2 ng/mL — AB (ref <0.031)
Troponin I: 0.2 ng/mL — ABNORMAL HIGH (ref <0.031)
Troponin I: 0.24 ng/mL — ABNORMAL HIGH (ref <0.031)

## 2015-09-29 LAB — LIPASE, BLOOD: LIPASE: 15 U/L (ref 11–51)

## 2015-09-29 MED ORDER — CLOBETASOL PROPIONATE 0.05 % EX CREA
1.0000 "application " | TOPICAL_CREAM | Freq: Two times a day (BID) | CUTANEOUS | Status: DC | PRN
  Filled 2015-09-29: qty 15

## 2015-09-29 MED ORDER — FUROSEMIDE 40 MG PO TABS
40.0000 mg | ORAL_TABLET | ORAL | Status: DC
  Administered 2015-09-30 – 2015-10-02 (×2): 40 mg via ORAL
  Filled 2015-09-29 (×2): qty 1

## 2015-09-29 MED ORDER — HEPARIN SODIUM (PORCINE) 5000 UNIT/ML IJ SOLN
5000.0000 [IU] | Freq: Three times a day (TID) | INTRAMUSCULAR | Status: DC
  Administered 2015-09-29 – 2015-10-01 (×4): 5000 [IU] via SUBCUTANEOUS
  Filled 2015-09-29 (×5): qty 1

## 2015-09-29 MED ORDER — PREDNISONE 10 MG PO TABS
10.0000 mg | ORAL_TABLET | Freq: Every day | ORAL | Status: DC
Start: 2015-09-29 — End: 2015-10-02
  Administered 2015-09-29 – 2015-10-02 (×4): 10 mg via ORAL
  Filled 2015-09-29 (×4): qty 1

## 2015-09-29 MED ORDER — LEVOTHYROXINE SODIUM 50 MCG PO TABS
50.0000 ug | ORAL_TABLET | Freq: Every day | ORAL | Status: DC
  Administered 2015-09-29 – 2015-10-01 (×3): 50 ug via ORAL
  Filled 2015-09-29 (×3): qty 1

## 2015-09-29 MED ORDER — MECLIZINE HCL 25 MG PO TABS
25.0000 mg | ORAL_TABLET | Freq: Four times a day (QID) | ORAL | Status: DC | PRN
  Filled 2015-09-29: qty 1

## 2015-09-29 MED ORDER — ROPINIROLE HCL 0.25 MG PO TABS
0.2500 mg | ORAL_TABLET | ORAL | Status: DC
  Administered 2015-09-30 – 2015-10-02 (×3): 0.25 mg via ORAL
  Filled 2015-09-29 (×3): qty 1

## 2015-09-29 MED ORDER — PREGABALIN 25 MG PO CAPS
50.0000 mg | ORAL_CAPSULE | Freq: Every day | ORAL | Status: DC
  Administered 2015-09-29 – 2015-10-01 (×3): 50 mg via ORAL
  Filled 2015-09-29 (×3): qty 2

## 2015-09-29 MED ORDER — FLUTICASONE PROPIONATE 50 MCG/ACT NA SUSP
1.0000 | Freq: Every day | NASAL | Status: DC
  Administered 2015-09-29 – 2015-10-01 (×2): 1 via NASAL
  Filled 2015-09-29: qty 16

## 2015-09-29 MED ORDER — CLOPIDOGREL BISULFATE 75 MG PO TABS
75.0000 mg | ORAL_TABLET | Freq: Every evening | ORAL | Status: DC
  Administered 2015-09-29 – 2015-10-01 (×3): 75 mg via ORAL
  Filled 2015-09-29 (×3): qty 1

## 2015-09-29 MED ORDER — FAMOTIDINE 20 MG PO TABS
20.0000 mg | ORAL_TABLET | Freq: Every evening | ORAL | Status: DC
  Administered 2015-09-29 – 2015-10-01 (×3): 20 mg via ORAL
  Filled 2015-09-29 (×3): qty 1

## 2015-09-29 MED ORDER — ACETAMINOPHEN 325 MG PO TABS: 650.0000 mg | ORAL_TABLET | Freq: Four times a day (QID) | ORAL | Status: DC | PRN

## 2015-09-29 MED ORDER — HYDROCODONE-ACETAMINOPHEN 5-325 MG PO TABS: 1.0000 | ORAL_TABLET | Freq: Four times a day (QID) | ORAL | Status: DC | PRN

## 2015-09-29 MED ORDER — HYDRALAZINE HCL 50 MG PO TABS: 50.0000 mg | ORAL_TABLET | Freq: Two times a day (BID) | ORAL | Status: DC

## 2015-09-29 MED ORDER — ASPIRIN 81 MG PO CHEW
324.0000 mg | CHEWABLE_TABLET | Freq: Once | ORAL | Status: AC
  Administered 2015-09-29: 324 mg via ORAL
  Filled 2015-09-29: qty 4

## 2015-09-29 MED ORDER — FUROSEMIDE 40 MG PO TABS
80.0000 mg | ORAL_TABLET | ORAL | Status: DC
  Administered 2015-09-29 – 2015-10-01 (×2): 80 mg via ORAL
  Filled 2015-09-29 (×2): qty 2

## 2015-09-29 MED ORDER — VANCOMYCIN HCL 10 G IV SOLR
1500.0000 mg | Freq: Once | INTRAVENOUS | Status: AC
  Administered 2015-09-29: 1500 mg via INTRAVENOUS
  Filled 2015-09-29: qty 1500

## 2015-09-29 MED ORDER — PREGABALIN 25 MG PO CAPS
25.0000 mg | ORAL_CAPSULE | Freq: Two times a day (BID) | ORAL | Status: DC
Start: 2015-09-29 — End: 2015-10-02
  Administered 2015-09-29 – 2015-10-02 (×5): 25 mg via ORAL
  Filled 2015-09-29 (×5): qty 1

## 2015-09-29 MED ORDER — HYDRALAZINE HCL 20 MG/ML IJ SOLN
10.0000 mg | Freq: Once | INTRAMUSCULAR | Status: AC
  Administered 2015-09-29: 10 mg via INTRAVENOUS

## 2015-09-29 MED ORDER — METOCLOPRAMIDE HCL 10 MG PO TABS
10.0000 mg | ORAL_TABLET | Freq: Every day | ORAL | Status: DC
  Administered 2015-09-29 – 2015-10-01 (×3): 10 mg via ORAL
  Filled 2015-09-29 (×2): qty 2
  Filled 2015-09-29: qty 1

## 2015-09-29 MED ORDER — DEXTROSE 5 % IV SOLN: 1.0000 g | Freq: Two times a day (BID) | INTRAVENOUS | Status: DC

## 2015-09-29 MED ORDER — CALCIUM CARBONATE-VITAMIN D 500-200 MG-UNIT PO TABS
1.0000 | ORAL_TABLET | Freq: Every day | ORAL | Status: DC
  Administered 2015-09-29: 1 via ORAL
  Filled 2015-09-29: qty 1

## 2015-09-29 MED ORDER — LEVOFLOXACIN IN D5W 500 MG/100ML IV SOLN
500.0000 mg | INTRAVENOUS | Status: DC
  Filled 2015-09-29: qty 100

## 2015-09-29 MED ORDER — CALCIUM ACETATE (PHOS BINDER) 667 MG PO CAPS
1334.0000 mg | ORAL_CAPSULE | Freq: Three times a day (TID) | ORAL | Status: DC
  Administered 2015-09-30 – 2015-10-02 (×6): 1334 mg via ORAL
  Filled 2015-09-29 (×6): qty 2

## 2015-09-29 MED ORDER — IRBESARTAN 75 MG PO TABS
150.0000 mg | ORAL_TABLET | Freq: Every day | ORAL | Status: DC
  Administered 2015-09-29 – 2015-10-02 (×4): 150 mg via ORAL
  Filled 2015-09-29 (×4): qty 2

## 2015-09-29 MED ORDER — DEXTROSE 5 % IV SOLN
2.0000 g | INTRAVENOUS | Status: DC
  Filled 2015-09-29: qty 2

## 2015-09-29 MED ORDER — VANCOMYCIN HCL IN DEXTROSE 1-5 GM/200ML-% IV SOLN: 1000.0000 mg | INTRAVENOUS | Status: DC

## 2015-09-29 MED ORDER — VANCOMYCIN HCL IN DEXTROSE 750-5 MG/150ML-% IV SOLN
750.0000 mg | INTRAVENOUS | Status: DC
  Administered 2015-10-01: 750 mg via INTRAVENOUS
  Filled 2015-09-29 (×2): qty 150

## 2015-09-29 MED ORDER — HYDRALAZINE HCL 20 MG/ML IJ SOLN
INTRAMUSCULAR | Status: AC
Start: 2015-09-29 — End: 2015-09-29
  Administered 2015-09-29: 10 mg via INTRAVENOUS
  Filled 2015-09-29: qty 1

## 2015-09-29 MED ORDER — BACLOFEN 10 MG PO TABS
5.0000 mg | ORAL_TABLET | Freq: Every day | ORAL | Status: DC
  Administered 2015-09-29 – 2015-10-01 (×3): 5 mg via ORAL
  Filled 2015-09-29 (×3): qty 1

## 2015-09-29 MED ORDER — RENA-VITE PO TABS
1.0000 | ORAL_TABLET | Freq: Every day | ORAL | Status: DC
  Administered 2015-09-29 – 2015-10-02 (×4): 1 via ORAL
  Filled 2015-09-29 (×4): qty 1

## 2015-09-29 MED ORDER — CALCIUM ACETATE (PHOS BINDER) 667 MG PO CAPS: 2001.0000 mg | ORAL_CAPSULE | ORAL | Status: DC

## 2015-09-29 MED ORDER — POLYETHYLENE GLYCOL 3350 17 G PO PACK: 17.0000 g | PACK | Freq: Every day | ORAL | Status: DC | PRN

## 2015-09-29 MED ORDER — CALCIUM ACETATE 667 MG PO CAPS: 1334.0000 mg | ORAL_CAPSULE | Freq: Every day | ORAL | Status: DC

## 2015-09-29 MED ORDER — SODIUM CHLORIDE 0.9% FLUSH
3.0000 mL | Freq: Two times a day (BID) | INTRAVENOUS | Status: DC
  Administered 2015-09-29 – 2015-10-01 (×4): 3 mL via INTRAVENOUS

## 2015-09-29 MED ORDER — ROPINIROLE HCL 0.25 MG PO TABS
0.2500 mg | ORAL_TABLET | ORAL | Status: DC
  Administered 2015-09-29 – 2015-10-01 (×2): 0.25 mg via ORAL
  Filled 2015-09-29 (×2): qty 1

## 2015-09-29 MED ORDER — ATORVASTATIN CALCIUM 20 MG PO TABS
20.0000 mg | ORAL_TABLET | Freq: Every evening | ORAL | Status: DC
  Administered 2015-09-29 – 2015-10-01 (×3): 20 mg via ORAL
  Filled 2015-09-29 (×3): qty 1

## 2015-09-29 MED ORDER — ASPIRIN EC 81 MG PO TBEC
81.0000 mg | DELAYED_RELEASE_TABLET | Freq: Every day | ORAL | Status: DC
  Administered 2015-09-29 – 2015-10-02 (×4): 81 mg via ORAL
  Filled 2015-09-29 (×4): qty 1

## 2015-09-29 MED ORDER — MAGNESIUM OXIDE 400 (241.3 MG) MG PO TABS
400.0000 mg | ORAL_TABLET | Freq: Every evening | ORAL | Status: DC
  Administered 2015-09-29 – 2015-10-01 (×3): 400 mg via ORAL
  Filled 2015-09-29 (×3): qty 1

## 2015-09-29 MED ORDER — MYCOPHENOLATE MOFETIL 250 MG PO CAPS
500.0000 mg | ORAL_CAPSULE | Freq: Two times a day (BID) | ORAL | Status: DC
  Administered 2015-09-29 – 2015-10-02 (×5): 500 mg via ORAL
  Filled 2015-09-29 (×7): qty 2

## 2015-09-29 MED ORDER — VANCOMYCIN HCL IN DEXTROSE 1-5 GM/200ML-% IV SOLN: 1000.0000 mg | Freq: Once | INTRAVENOUS | Status: DC

## 2015-09-29 MED ORDER — LEVOFLOXACIN IN D5W 750 MG/150ML IV SOLN
750.0000 mg | Freq: Once | INTRAVENOUS | Status: AC
  Administered 2015-09-29: 750 mg via INTRAVENOUS
  Filled 2015-09-29: qty 150

## 2015-09-29 MED ORDER — CALCIUM ACETATE (PHOS BINDER) 667 MG PO CAPS: 1334.0000 mg | ORAL_CAPSULE | ORAL | Status: DC

## 2015-09-29 MED ORDER — INSULIN GLARGINE 100 UNIT/ML ~~LOC~~ SOLN
30.0000 [IU] | Freq: Every day | SUBCUTANEOUS | Status: DC
  Administered 2015-09-29 – 2015-10-01 (×3): 30 [IU] via SUBCUTANEOUS
  Filled 2015-09-29 (×5): qty 0.3

## 2015-09-29 MED ORDER — CEFEPIME HCL 2 G IJ SOLR
2.0000 g | Freq: Once | INTRAMUSCULAR | Status: AC
  Administered 2015-09-29: 2 g via INTRAVENOUS
  Filled 2015-09-29: qty 2

## 2015-09-29 MED ORDER — CARVEDILOL 6.25 MG PO TABS
6.2500 mg | ORAL_TABLET | Freq: Two times a day (BID) | ORAL | Status: DC
  Administered 2015-09-29 – 2015-10-02 (×5): 6.25 mg via ORAL
  Filled 2015-09-29 (×5): qty 1

## 2015-09-29 MED ORDER — INSULIN ASPART 100 UNIT/ML ~~LOC~~ SOLN
0.0000 [IU] | Freq: Three times a day (TID) | SUBCUTANEOUS | Status: DC
  Administered 2015-09-29: 3 [IU] via SUBCUTANEOUS
  Administered 2015-09-30: 9 [IU] via SUBCUTANEOUS
  Administered 2015-09-30: 2 [IU] via SUBCUTANEOUS
  Administered 2015-09-30: 3 [IU] via SUBCUTANEOUS
  Administered 2015-10-01: 2 [IU] via SUBCUTANEOUS
  Filled 2015-09-29: qty 2
  Filled 2015-09-29: qty 9
  Filled 2015-09-29: qty 3
  Filled 2015-09-29: qty 2
  Filled 2015-09-29: qty 3

## 2015-09-29 NOTE — ED Notes (Addendum)
Per EMS report, patient finished dialysis and felt weak, felt "off". Blood sugar was 179, temperature was 100, all other VS WNL. Patient was oriented in ambulance. Patient is alert and oriented upon arrival. Patient appears drowsy, but answers questions easily.  Patient c/o 10/10 pain in lower abdomen.

## 2015-09-29 NOTE — Consult Note (Signed)
Pharmacy Antibiotic Note  [name] is a 60 y.o. female admitted on 09/29/2015 with pneumonia.  Pharmacy has been consulted for vancomycin and levofloxacin dosing. Pt is an ESRD pt on HD. Pt received HD today.  Plan: Vancomycin 1500mg  once. Then 750mg  q dialysis. Levofloxacin 750mg  once, then 500mg  q 48 hours. Will draw vanc trough prior to 3rd HD session.  Height: 5\' 1"  (154.9 cm) Weight: 176 lb 5.9 oz (80 kg) IBW/kg (Calculated) : 47.8  Temp (24hrs), Avg:98.8 F (37.1 C), Min:98.8 F (37.1 C), Max:98.8 F (37.1 C)   Recent Labs Lab 09/29/15 1208  WBC 12.2*  CREATININE 2.94*    Estimated Creatinine Clearance: 19.3 mL/min (by C-G formula based on Cr of 2.94).    Allergies  Allergen Reactions  . Adhesive [Tape] Other (See Comments)    Reaction:  Blisters   . Shellfish Allergy Other (See Comments)    Reaction:  Unknown   . Doxycycline Rash    Antimicrobials this admission: cefepime 3/14 >>  vancomycin 3/14 >>  Levofloxacin 3/14>>  Dose adjustments this admission:   Microbiology results: 3/14 BCx: pending   Thank you for allowing pharmacy to be a part of this patient's care.  Ramond Dial 09/29/2015 2:15 PM

## 2015-09-29 NOTE — ED Provider Notes (Signed)
[facility] Emergency Department Provider Note  Time seen: 11:58 AM  I have reviewed the triage vital signs and the nursing notes.   HISTORY  Chief Complaint Weakness    HPI [name] ROSTRO is a 60 y.o. female with a past medical history of diabetes, COPD, end-stage renal disease on hemodialysis, hypertension, MI, CHF, atrial fibrillation, presents the emergency department with generalized weakness and lower abdominal pain. According to the patient for the past 2 weeks she has been coughing with congestion, diagnosed clinically with influenza 2 weeks ago at her nursing facility as it was going around very heavily per patient. She states for the past 5-7 days she has been increasingly weak, was at dialysis today and patient was falling asleep very somnolent, very weak-appearing so they sent her to the emergency department for evaluation. Upon initial examination and history the patient is somnolent, does fall asleep but awakens easily to voice and answers questions appropriately follows commands. Does state the last few days she has been having significant lower abdominal pain, still makes urine but denies dysuria.Describes abdominal pain as moderate, dull aching sensation in the suprapubic region.     Past Medical History  Diagnosis Date  . Diabetes ([place])   . COPD (chronic obstructive pulmonary disease) ([place])   . CKD (chronic kidney disease)   . CAD (coronary artery disease)   . HTN (hypertension)   . MI (myocardial infarction) ([place]) 05-17-2014  . CHF (congestive heart failure) ([place])   . Atrial fibrillation ([place])   . GERD (gastroesophageal reflux disease)   . Pancreatitis   . Peripheral autonomic neuropathy due to diabetes mellitus ([place])   . Diabetic peripheral neuropathy associated with type 2 diabetes mellitus ([place])   . Dialysis patient [facility])     Patient Active Problem List   Diagnosis Date Noted  . Stage II pressure ulcer of right upper back 09/04/2015   . Stage II pressure ulcer of back 07/17/2015  . Acute coronary syndrome ([place]) 05/20/2014    Past Surgical History  Procedure Laterality Date  . Left heart catheterization with coronary angiogram N/A 05/20/2014    Procedure: LEFT HEART CATHETERIZATION WITH CORONARY ANGIOGRAM;  Surgeon: Clent Demark, MD;  Location: [facility] CATH LAB;  Service: Cardiovascular;  Laterality: N/A;  . Coronary angioplasty      Cardiac stents  . Back surgery    . Eye surgery      bilateral cataract extractions  . Abdominal hysterectomy    . Cholecystectomy    . Portacath placement    . Av fistula placement Left 12/25/2014    Procedure: ARTERIOVENOUS (AV) FISTULA CREATION;  Surgeon: Algernon Huxley, MD;  Location: [facility] ORS;  Service: Vascular;  Laterality: Left;  . Peripheral vascular catheterization N/A 02/12/2015    Procedure: A/V Shuntogram/Fistulagram;  Surgeon: Algernon Huxley, MD;  Location: [place] CV LAB;  Service: Cardiovascular;  Laterality: N/A;  . Peripheral vascular catheterization N/A 02/12/2015    Procedure: A/V Shunt Intervention;  Surgeon: Algernon Huxley, MD;  Location: [place] CV LAB;  Service: Cardiovascular;  Laterality: N/A;  . Peripheral vascular catheterization N/A 04/09/2015    Procedure: Dialysis/Perma Catheter Removal;  Surgeon: Algernon Huxley, MD;  Location: [place] CV LAB;  Service: Cardiovascular;  Laterality: N/A;    Current Outpatient Rx  Name  Route  Sig  Dispense  Refill  . acetaminophen (TYLENOL) 325 MG tablet   Oral   Take 650 mg by mouth every 6 (six) hours as needed  for mild pain.          Marland Kitchen aspirin EC 81 MG tablet   Oral   Take 81 mg by mouth daily.         Marland Kitchen atorvastatin (LIPITOR) 20 MG tablet   Oral   Take 20 mg by mouth every evening.          . baclofen (LIORESAL) 10 MG tablet   Oral   Take 5 mg by mouth at bedtime.         . calcium acetate (PHOSLO) 667 MG capsule   Oral   Take 1,334 mg by mouth daily.         . Calcium Carbonate-Vitamin D  (CALCIUM 600+D) 600-400 MG-UNIT tablet   Oral   Take 1 tablet by mouth daily.         . carvedilol (COREG) 6.25 MG tablet   Oral   Take 6.25 mg by mouth 2 (two) times daily.          . clobetasol cream (TEMOVATE) 0.05 %   Topical   Apply 1 application topically 2 (two) times daily as needed (for itching).         . clobetasol ointment (TEMOVATE) 0.05 %   Topical   Apply 1 application topically 2 (two) times daily.         . clopidogrel (PLAVIX) 75 MG tablet   Oral   Take 75 mg by mouth every evening.          . docusate sodium (COLACE) 100 MG capsule   Oral   Take 100 mg by mouth daily.          . famotidine (PEPCID) 20 MG tablet   Oral   Take 20 mg by mouth every evening.         . fluticasone (FLONASE) 50 MCG/ACT nasal spray   Each Nare   Place 1 spray into both nostrils daily.         . furosemide (LASIX) 40 MG tablet   Oral   Take 40 mg by mouth every Monday, Wednesday, and Friday.         . furosemide (LASIX) 80 MG tablet   Oral   Take 80 mg by mouth every Tuesday, Thursday, Saturday, and Sunday.         . hydrALAZINE (APRESOLINE) 50 MG tablet   Oral   Take 50 mg by mouth 2 (two) times daily.         Marland Kitchen HYDROcodone-acetaminophen (NORCO) 5-325 MG per tablet   Oral   Take 1 tablet by mouth every 6 (six) hours as needed for moderate pain.   30 tablet   0   . insulin glargine (LANTUS) 100 UNIT/ML injection   Subcutaneous   Inject 35 Units into the skin at bedtime.          . insulin lispro (HUMALOG) 100 UNIT/ML injection   Subcutaneous   Inject 4-10 Units into the skin daily. Pt uses per sliding scale:    0-200:  No insulin  201-250:  4 units  251-300:  6 units  301-350:  8 units  351-400:  10 units Greater than 400:  Call MD         . isosorbide dinitrate (ISORDIL) 30 MG tablet   Oral   Take 30 mg by mouth daily.          Marland Kitchen levothyroxine (SYNTHROID, LEVOTHROID) 50 MCG tablet   Oral   Take 50 mcg by mouth  at bedtime.           Marland Kitchen losartan (COZAAR) 50 MG tablet   Oral   Take 50 mg by mouth 2 (two) times daily.          . magnesium oxide (MAG-OX) 400 MG tablet   Oral   Take 400 mg by mouth every evening.          . meclizine (ANTIVERT) 25 MG tablet   Oral   Take 25 mg by mouth every 6 (six) hours as needed for dizziness or nausea.          . metoCLOPramide (REGLAN) 10 MG tablet   Oral   Take 10 mg by mouth at bedtime.         . multivitamin (RENA-VIT) TABS tablet   Oral   Take 1 tablet by mouth daily.         . mycophenolate (CELLCEPT) 500 MG tablet   Oral   Take 500 mg by mouth 2 (two) times daily.         . polyethylene glycol (MIRALAX / GLYCOLAX) packet   Oral   Take 17 g by mouth daily as needed for mild constipation.         . predniSONE (DELTASONE) 10 MG tablet   Oral   Take 10 mg by mouth daily.         . pregabalin (LYRICA) 25 MG capsule   Oral   Take 25 mg by mouth 2 (two) times daily.         . pregabalin (LYRICA) 50 MG capsule   Oral   Take 50 mg by mouth at bedtime.         Marland Kitchen rOPINIRole (REQUIP) 0.25 MG tablet   Oral   Take 0.25 mg by mouth See admin instructions. Pt takes one tablet once a day on Sunday, Tuesday, Thursday, and Saturday.   Pt takes one tablet twice a day on Monday, Wednesday, and Friday.           Allergies Adhesive; Shellfish allergy; and Doxycycline  Family History  Problem Relation Age of Onset  . Diabetes Mother   . Hypertension Mother     Social History Social History  Substance Use Topics  . Smoking status: Never Smoker   . Smokeless tobacco: Never Used  . Alcohol Use: No    Review of Systems Constitutional: Negative for fever. Cardiovascular: Negative for chest pain. Respiratory: Negative for shortness of breath. Gastrointestinal: Moderate lower abdominal pain. Negative for nausea, vomiting, diarrhea Genitourinary: Negative for dysuria. Neurological: Negative for headache 10-point ROS otherwise  negative.  ____________________________________________   PHYSICAL EXAM:  VITAL SIGNS: ED Triage Vitals  Enc Vitals Group     BP 09/29/15 1136 184/71 mmHg     Pulse Rate 09/29/15 1136 96     Resp 09/29/15 1136 18     Temp 09/29/15 1136 98.8 F (37.1 C)     Temp Source 09/29/15 1136 Oral     SpO2 09/29/15 1136 95 %     Weight 09/29/15 1136 176 lb 5.9 oz (80 kg)     Height 09/29/15 1136 5\' 1"  (1.549 m)     Head Cir --      Peak Flow --      Pain Score 09/29/15 1139 10     Pain Loc --      Pain Edu? --      Excl. in [place]? --     Constitutional: Alert and oriented. Somnolent, falls  asleep easily but awakens to voice easily. Eyes: Normal exam ENT   Head: Normocephalic and atraumatic   Mouth/Throat: Mucous membranes are moist. Cardiovascular: Normal rate, regular rhythm. No murmu Respiratory: Normal respiratory effort without tachypnea nor retractions. Breath sounds are clear  Gastrointestinal: Soft, mild lower abdominal tenderness palpation. No rebound or guarding. No distention. Musculoskeletal: Nontender with normal range of motion in all extremities.  Neurologic:  Normal speech and language. No gross focal neurologic deficits  Skin:  Skin is warm, dry and intact.  Psychiatric: Mood and affect are normal. Speech and behavior are normal.   ____________________________________________    EKG  EKG reviewed and interpreted by myself shows normal sinus rhythm at 96 bpm, narrow QRS, normal axis, normal intervals, nonspecific ST changes with T-wave inversions in the lateral leads. T-wave inversions largely unchanged from 07/05/15.  ____________________________________________    RADIOLOGY  Possible right lower lobe pneumonia  ____________________________________________   INITIAL IMPRESSION / ASSESSMENT AND PLAN / ED COURSE  Pertinent labs & imaging results that were available during my care of the patient were reviewed by me and considered in my medical decision  making (see chart for details).  Patient presents from dialysis with generalized weakness and somnolence. Patient states recent cough, congestion, shortness of breath, states she was recently diagnosed with the flu 2 weeks ago. Patient is quite somnolent during examination, falls asleep easily but opens her eyes and awakens to voice. Patient answers questions appropriate follows commands. We will check labs, patient does have lower abdominal pain, and tenderness to palpation we'll check a urinalysis.  Labs show an elevated troponin 0.21, patient typically is in the range of 0.05. Also possible right lower lobe pneumonia with a white count of 12, suspect possible pneumonia we will treat for healthcare associated pneumonia, does aspirin, admitted to the hospital. ____________________________________________   FINAL CLINICAL IMPRESSION(S) / ED DIAGNOSES  Lower abdominal pain Generalized weakness Health care associated pneumonia  Harvest Dark, MD 09/29/15 1314

## 2015-09-29 NOTE — H&P (Signed)
[facility] NAME: [name]    MR#:  [id number]  DATE OF BIRTH:  [birth year]  DATE OF ADMISSION:  09/29/2015  PRIMARY CARE PHYSICIAN: Marden Noble, MD   REQUESTING/REFERRING PHYSICIAN: [email or web address].  CHIEF COMPLAINT:   Chief Complaint  Patient presents with  . Weakness    HISTORY OF PRESENT ILLNESS: [name]  is a 60 y.o. female with a known history of diabetes, COPD, chronic kidney disease on hemodialysis, hypertension, mitral infarction, congestive heart failure, atrial fibrillation, pancreatitis,- lives in [facility] nursing home with her husband who has a double amputation. for last 1 week there are the many patients of influenza around in the nursing home. Last 3-4 days she started feeling cough generalized weakness, went for dialysis today and after listening to her symptoms today decided to send her to emergency room.  She was noted to have pneumonia on chest x-ray and given to hospitalist team for further management as her troponin is also on the higher side.  PAST MEDICAL HISTORY:   Past Medical History  Diagnosis Date  . Diabetes ([place])   . COPD (chronic obstructive pulmonary disease) ([place])   . CKD (chronic kidney disease)   . CAD (coronary artery disease)   . HTN (hypertension)   . MI (myocardial infarction) ([place]) 05-17-2014  . CHF (congestive heart failure) ([place])   . Atrial fibrillation ([place])   . GERD (gastroesophageal reflux disease)   . Pancreatitis   . Peripheral autonomic neuropathy due to diabetes mellitus ([place])   . Diabetic peripheral neuropathy associated with type 2 diabetes mellitus ([place])   . Dialysis patient [facility])     PAST SURGICAL HISTORY: Past Surgical History  Procedure Laterality Date  . Left heart catheterization with coronary angiogram N/A 05/20/2014    Procedure: LEFT HEART CATHETERIZATION WITH CORONARY ANGIOGRAM;  Surgeon: Clent Demark, MD;  Location: [facility] CATH LAB;  Service:  Cardiovascular;  Laterality: N/A;  . Coronary angioplasty      Cardiac stents  . Back surgery    . Eye surgery      bilateral cataract extractions  . Abdominal hysterectomy    . Cholecystectomy    . Portacath placement    . Av fistula placement Left 12/25/2014    Procedure: ARTERIOVENOUS (AV) FISTULA CREATION;  Surgeon: Algernon Huxley, MD;  Location: [facility] ORS;  Service: Vascular;  Laterality: Left;  . Peripheral vascular catheterization N/A 02/12/2015    Procedure: A/V Shuntogram/Fistulagram;  Surgeon: Algernon Huxley, MD;  Location: [facility] CV LAB;  Service: Cardiovascular;  Laterality: N/A;  . Peripheral vascular catheterization N/A 02/12/2015    Procedure: A/V Shunt Intervention;  Surgeon: Algernon Huxley, MD;  Location: [place] CV LAB;  Service: Cardiovascular;  Laterality: N/A;  . Peripheral vascular catheterization N/A 04/09/2015    Procedure: Dialysis/Perma Catheter Removal;  Surgeon: Algernon Huxley, MD;  Location: [facility] CV LAB;  Service: Cardiovascular;  Laterality: N/A;    SOCIAL HISTORY:  Social History  Substance Use Topics  . Smoking status: Never Smoker   . Smokeless tobacco: Never Used  . Alcohol Use: No    FAMILY HISTORY:  Family History  Problem Relation Age of Onset  . Diabetes Mother   . Hypertension Mother     DRUG ALLERGIES:  Allergies  Allergen Reactions  . Adhesive [Tape] Other (See Comments)    Reaction:  Blisters   . Shellfish Allergy Other (See Comments)    Reaction:  Unknown   . Doxycycline Rash    REVIEW OF SYSTEMS:   CONSTITUTIONAL: Positive fever, fatigue or weakness.  EYES: No blurred or double vision.  EARS, NOSE, AND THROAT: No tinnitus or ear pain.  RESPIRATORY: Positive cough, no shortness of breath, wheezing or hemoptysis.  CARDIOVASCULAR: No chest pain, orthopnea, edema.  GASTROINTESTINAL: No nausea, vomiting, diarrhea or abdominal pain.  GENITOURINARY: No dysuria, hematuria.  ENDOCRINE: No polyuria, nocturia,  HEMATOLOGY: No  anemia, easy bruising or bleeding SKIN: No rash or lesion. MUSCULOSKELETAL: No joint pain or arthritis.   NEUROLOGIC: No tingling, numbness, weakness.  PSYCHIATRY: No anxiety or depression.   MEDICATIONS AT HOME:  Prior to Admission medications   Medication Sig Start Date End Date Taking? Authorizing Provider  acetaminophen (TYLENOL) 325 MG tablet Take 650 mg by mouth every 6 (six) hours as needed for mild pain.     Historical Provider, MD  aspirin EC 81 MG tablet Take 81 mg by mouth daily.    Historical Provider, MD  atorvastatin (LIPITOR) 20 MG tablet Take 20 mg by mouth every evening.     Historical Provider, MD  baclofen (LIORESAL) 10 MG tablet Take 5 mg by mouth at bedtime.    Historical Provider, MD  calcium acetate (PHOSLO) 667 MG capsule Take 1,334 mg by mouth daily.    Historical Provider, MD  Calcium Carbonate-Vitamin D (CALCIUM 600+D) 600-400 MG-UNIT tablet Take 1 tablet by mouth daily.    Historical Provider, MD  carvedilol (COREG) 6.25 MG tablet Take 6.25 mg by mouth 2 (two) times daily.     Historical Provider, MD  clobetasol cream (TEMOVATE) [id number] % Apply 1 application topically 2 (two) times daily as needed (for itching).    Historical Provider, MD  clobetasol ointment (TEMOVATE) [id number] % Apply 1 application topically 2 (two) times daily.    Historical Provider, MD  clopidogrel (PLAVIX) 75 MG tablet Take 75 mg by mouth every evening.     Historical Provider, MD  docusate sodium (COLACE) 100 MG capsule Take 100 mg by mouth daily.     Historical Provider, MD  famotidine (PEPCID) 20 MG tablet Take 20 mg by mouth every evening.    Historical Provider, MD  fluticasone (FLONASE) 50 MCG/ACT nasal spray Place 1 spray into both nostrils daily.    Historical Provider, MD  furosemide (LASIX) 40 MG tablet Take 40 mg by mouth every Monday, Wednesday, and Friday.    Historical Provider, MD  furosemide (LASIX) 80 MG tablet Take 80 mg by mouth every Tuesday, Thursday, Saturday, and Sunday.     Historical Provider, MD  hydrALAZINE (APRESOLINE) 50 MG tablet Take 50 mg by mouth 2 (two) times daily.    Historical Provider, MD  HYDROcodone-acetaminophen (NORCO) 5-325 MG per tablet Take 1 tablet by mouth every 6 (six) hours as needed for moderate pain. 12/25/14   Algernon Huxley, MD  insulin glargine (LANTUS) 100 UNIT/ML injection Inject 35 Units into the skin at bedtime.     Historical Provider, MD  insulin lispro (HUMALOG) 100 UNIT/ML injection Inject 4-10 Units into the skin daily. Pt uses per sliding scale:    0-200:  No insulin  201-250:  4 units  251-300:  6 units  301-350:  8 units  351-400:  10 units Greater than 400:  Call MD    Historical Provider, MD  isosorbide dinitrate (ISORDIL) 30 MG tablet Take 30 mg by mouth daily.     Historical Provider, MD  levothyroxine (SYNTHROID, LEVOTHROID) 50 MCG tablet Take  50 mcg by mouth at bedtime.     Historical Provider, MD  losartan (COZAAR) 50 MG tablet Take 50 mg by mouth 2 (two) times daily.     Historical Provider, MD  magnesium oxide (MAG-OX) 400 MG tablet Take 400 mg by mouth every evening.     Historical Provider, MD  meclizine (ANTIVERT) 25 MG tablet Take 25 mg by mouth every 6 (six) hours as needed for dizziness or nausea.     Historical Provider, MD  metoCLOPramide (REGLAN) 10 MG tablet Take 10 mg by mouth at bedtime.    Historical Provider, MD  multivitamin (RENA-VIT) TABS tablet Take 1 tablet by mouth daily.    Historical Provider, MD  mycophenolate (CELLCEPT) 500 MG tablet Take 500 mg by mouth 2 (two) times daily.    Historical Provider, MD  polyethylene glycol (MIRALAX / GLYCOLAX) packet Take 17 g by mouth daily as needed for mild constipation.    Historical Provider, MD  predniSONE (DELTASONE) 10 MG tablet Take 10 mg by mouth daily.    Historical Provider, MD  pregabalin (LYRICA) 25 MG capsule Take 25 mg by mouth 2 (two) times daily.    Historical Provider, MD  pregabalin (LYRICA) 50 MG capsule Take 50 mg by mouth at bedtime.     Historical Provider, MD  rOPINIRole (REQUIP) 0.25 MG tablet Take 0.25 mg by mouth See admin instructions. Pt takes one tablet once a day on Sunday, Tuesday, Thursday, and Saturday.   Pt takes one tablet twice a day on Monday, Wednesday, and Friday.    Historical Provider, MD      PHYSICAL EXAMINATION:   VITAL SIGNS: Blood pressure 184/71, pulse 96, temperature 98.8 F (37.1 C), temperature source Oral, resp. rate 18, height 5\' 1"  (1.549 m), weight 80 kg (176 lb 5.9 oz), SpO2 95 %.  GENERAL:  60 y.o.-year-old patient lying in the bed with no acute distress.  EYES: Pupils equal, round, reactive to light and accommodation. No scleral icterus. Extraocular muscles intact.  HEENT: Head atraumatic, normocephalic. Oropharynx and nasopharynx clear.  NECK:  Supple, no jugular venous distention. No thyroid enlargement, no tenderness.  LUNGS: Normal breath sounds bilaterally, no wheezing, some crepitation. No use of accessory muscles of respiration.  CARDIOVASCULAR: S1, S2 normal. No murmurs, rubs, or gallops.  ABDOMEN: Soft, nontender, nondistended. Bowel sounds present. No organomegaly or mass.  EXTREMITIES: No pedal edema, cyanosis, or clubbing. Left arm AV fistula present. NEUROLOGIC: Cranial nerves II through XII are intact. Muscle strength 5/5 in all extremities. Sensation intact. Gait not checked. Generalized weakness. PSYCHIATRIC: The patient is alert and oriented x 3.  SKIN: No obvious rash, lesion, or ulcer.   LABORATORY PANEL:   CBC  Recent Labs Lab 09/29/15 1208  WBC 12.2*  HGB 9.7*  HCT 29.8*  PLT 181  MCV 88.6  MCH 28.9  MCHC 32.6  RDW 18.1*   ------------------------------------------------------------------------------------------------------------------  Chemistries   Recent Labs Lab 09/29/15 1208  NA 134*  K 3.2*  CL 93*  CO2 31  GLUCOSE 199*  BUN 20  CREATININE 2.94*  CALCIUM 8.2*  AST 25  ALT 25  ALKPHOS 82  BILITOT 0.9    ------------------------------------------------------------------------------------------------------------------ estimated creatinine clearance is 19.3 mL/min (by C-G formula based on Cr of 2.94). ------------------------------------------------------------------------------------------------------------------ No results for input(s): TSH, T4TOTAL, T3FREE, THYROIDAB in the last 72 hours.  Invalid input(s): FREET3   Coagulation profile No results for input(s): INR, PROTIME in the last 168 hours. ------------------------------------------------------------------------------------------------------------------- No results for input(s): DDIMER in the last  72 hours. -------------------------------------------------------------------------------------------------------------------  Cardiac Enzymes  Recent Labs Lab 09/29/15 1208  TROPONINI 0.21*   ------------------------------------------------------------------------------------------------------------------ Invalid input(s): POCBNP  ---------------------------------------------------------------------------------------------------------------  Urinalysis    Component Value Date/Time   COLORURINE YELLOW* 09/29/2015 [place] 08/09/2014 1654   APPEARANCEUR CLEAR* 09/29/2015 1219   APPEARANCEUR Clear 08/09/2014 1654   LABSPEC 1.010 09/29/2015 1219   LABSPEC 1.007 08/09/2014 1654   PHURINE 7.0 09/29/2015 1219   PHURINE 8.0 08/09/2014 1654   GLUCOSEU 150* 09/29/2015 1219   GLUCOSEU 50 mg/dL 08/09/2014 1654   HGBUR 1+* 09/29/2015 1219   HGBUR 2+ 08/09/2014 1654   BILIRUBINUR NEGATIVE 09/29/2015 1219   BILIRUBINUR Negative 08/09/2014 [place]* 09/29/2015 1219   KETONESUR Negative 08/09/2014 1654   PROTEINUR >500* 09/29/2015 1219   PROTEINUR >=500 08/09/2014 1654   NITRITE NEGATIVE 09/29/2015 1219   NITRITE Negative 08/09/2014 1654   LEUKOCYTESUR NEGATIVE 09/29/2015 1219   LEUKOCYTESUR Negative  08/09/2014 1654     RADIOLOGY: Dg Chest 2 View  09/29/2015  CLINICAL DATA:  Felt weak after dialysis, cough, drowsy, history diabetes mellitus, hypertension, end-stage renal disease, coronary artery disease post MI and coronary PTCA, atrial fibrillation, CHF EXAM: CHEST  2 VIEW COMPARISON:  09/13/2014 FINDINGS: Enlargement of cardiac silhouette with pulmonary vascular congestion. Atherosclerotic calcification aorta. Increased interstitial markings since previous exam question pulmonary edema. LEFT basilar atelectasis. RIGHT basilar atelectasis versus infiltrate. No pleural effusion or pneumothorax. Bones demineralized. IMPRESSION: Enlargement of cardiac silhouette with pulmonary vascular congestion and suspect pulmonary edema. LEFT basilar atelectasis with atelectasis versus consolidation RIGHT lower lobe. Electronically Signed   By: Lavonia Dana M.D.   On: 09/29/2015 12:55    EKG: Orders placed or performed during the hospital encounter of 09/29/15  . EKG 12-Lead  . EKG 12-Lead    IMPRESSION AND PLAN:  * Pneumonia  We'll give broad-spectrum antibiotic as patient is from nursing home and she is on hemodialysis.  She is not able to give sputum sample as per her.  Blood cultures need to be collected  * End-stage renal disease on hemodialysis  Received hemodialysis today, nephrology consult.  * Diabetes  Continue basal insulin and keep on insulin sliding scale coverage.  * Coronary artery disease and CHF  Continue home medications.  * Hypertension  Continue home medications.  All the records are reviewed and case discussed with ED provider. Management plans discussed with the patient, family and they are in agreement.  CODE STATUS: Full code. Code Status History    Date Active Date Inactive Code Status Order ID Comments User Context   05/20/2014  1:14 PM 05/22/2014  6:54 PM Full Code [id number]  Charolette Forward, MD Inpatient    Advance Directive Documentation        Most Recent  Value   Type of Advance Directive  Living will   Pre-existing out of facility DNR order (yellow form or pink MOST form)     "MOST" Form in Place?         TOTAL TIME TAKING CARE OF THIS PATIENT: 50 minutes.    Vaughan Basta M.D on 09/29/2015   Between 7am to 6pm - Pager - [phone]  After 6pm go to [email or web address] - password [email or web address] [facility]  Office  [phone]  CC: Primary care physician; Marden Noble, MD   Note: This dictation was prepared with Dragon dictation along with smaller phrase technology. Any transcriptional errors that result from this process are unintentional.

## 2015-09-29 NOTE — Progress Notes (Signed)
Subjective:  Patient is known to our practice from outpatient. She dialyzes at [facility] Tuesday/Thursday/Saturday She reports that she was sent from the dialysis center for episode of unresponsiveness after she finished dialysis. She also reports significant amount of coughing and difficulty producing sputum Chest x-ray suggests possible right lower lobe consolidation. She is admitted for further evaluation and management Patient denies any fevers or chills   Objective:  Vital signs in last 24 hours:  Temp:  [98.7 F (37.1 C)-98.8 F (37.1 C)] 98.7 F (37.1 C) (03/14 1554) Pulse Rate:  [93-97] 93 (03/14 1554) Resp:  [16-18] 18 (03/14 1554) BP: (177-203)/(63-101) 180/101 mmHg (03/14 1554) SpO2:  [93 %-95 %] 95 % (03/14 1554) Weight:  [80 kg (176 lb 5.9 oz)] 80 kg (176 lb 5.9 oz) (03/14 1136)  Weight change:  Filed Weights   09/29/15 1136  Weight: 80 kg (176 lb 5.9 oz)    Intake/Output:   No intake or output data in the 24 hours ending 09/29/15 1727   Physical Exam: General: Sitting up in the bed, eating dinner   HEENT Anicteric, moist oral mucous membranes   Neck Supple   Pulm/lungs Room air, bilateral crackles   CVS/Heart Irregular   Abdomen:  Soft, nontender   Extremities: + edema   Neurologic: Alert, oriented, speech normal   Skin: Chronic wound on the back   Access: AV fistula left arm        Basic Metabolic Panel:   Recent Labs Lab 09/29/15 1208  NA 134*  K 3.2*  CL 93*  CO2 31  GLUCOSE 199*  BUN 20  CREATININE 2.94*  CALCIUM 8.2*     CBC:  Recent Labs Lab 09/29/15 1208  WBC 12.2*  HGB 9.7*  HCT 29.8*  MCV 88.6  PLT 181      Microbiology:  No results found for this or any previous visit (from the past 720 hour(s)).  Coagulation Studies: No results for input(s): LABPROT, INR in the last 72 hours.  Urinalysis:  Recent Labs  09/29/15 1219  COLORURINE YELLOW*  LABSPEC 1.010  PHURINE 7.0  GLUCOSEU 150*   HGBUR 1+*  BILIRUBINUR NEGATIVE  KETONESUR TRACE*  PROTEINUR >500*  NITRITE NEGATIVE  LEUKOCYTESUR NEGATIVE      Imaging: Dg Chest 2 View  09/29/2015  CLINICAL DATA:  Felt weak after dialysis, cough, drowsy, history diabetes mellitus, hypertension, end-stage renal disease, coronary artery disease post MI and coronary PTCA, atrial fibrillation, CHF EXAM: CHEST  2 VIEW COMPARISON:  09/13/2014 FINDINGS: Enlargement of cardiac silhouette with pulmonary vascular congestion. Atherosclerotic calcification aorta. Increased interstitial markings since previous exam question pulmonary edema. LEFT basilar atelectasis. RIGHT basilar atelectasis versus infiltrate. No pleural effusion or pneumothorax. Bones demineralized. IMPRESSION: Enlargement of cardiac silhouette with pulmonary vascular congestion and suspect pulmonary edema. LEFT basilar atelectasis with atelectasis versus consolidation RIGHT lower lobe. Electronically Signed   By: Lavonia Dana M.D.   On: 09/29/2015 12:55     Medications:     . aspirin EC  81 mg Oral Daily  . atorvastatin  20 mg Oral QPM  . baclofen  5 mg Oral QHS  . [START ON 10/01/2015] calcium acetate  1,334 mg Oral Once per day on Tue Thu Sat  . [START ON 09/30/2015] calcium acetate  2,001 mg Oral Once per day on Sun Mon Wed Fri  . calcium-vitamin D  1 tablet Oral Daily  . carvedilol  6.25 mg Oral BID  . [START ON 10/01/2015] ceFEPime (MAXIPIME) IV  2 g Intravenous Once per day on Tue Thu Sat  . clopidogrel  75 mg Oral QPM  . famotidine  20 mg Oral QPM  . fluticasone  1 spray Each Nare Daily  . [START ON 09/30/2015] furosemide  40 mg Oral Q M,W,F  . furosemide  80 mg Oral Q T,Th,S,Su  . heparin  5,000 Units Subcutaneous 3 times per day  . insulin aspart  0-9 Units Subcutaneous TID WC  . insulin glargine  30 Units Subcutaneous QHS  . irbesartan  150 mg Oral Daily  . [START ON 10/01/2015] levofloxacin (LEVAQUIN) IV  500 mg Intravenous Q48H  . levothyroxine  50 mcg Oral QHS   . magnesium oxide  400 mg Oral QPM  . metoCLOPramide  10 mg Oral QHS  . multivitamin  1 tablet Oral Daily  . mycophenolate  500 mg Oral BID  . predniSONE  10 mg Oral Daily  . pregabalin  25 mg Oral BID  . pregabalin  50 mg Oral QHS  . rOPINIRole  0.25 mg Oral Once per day on Sun Tue Thu Sat  . [START ON 09/30/2015] rOPINIRole  0.25 mg Oral 2 times per day on Mon Wed Fri  . sodium chloride flush  3 mL Intravenous Q12H  . vancomycin  1,500 mg Intravenous Once  . [START ON 10/01/2015] vancomycin  750 mg Intravenous Q T,Th,Sa-HD   acetaminophen, clobetasol cream, HYDROcodone-acetaminophen, meclizine, polyethylene glycol  Assessment/ Plan:  60 y.o. female with end-stage renal disease, diabetes, with complications of retinopathy, GERD, hyperlipidemia, glaucoma, legal blindness, history of hysterectomy, right foot surgery, atrial fibrillation, COPD, congestive heart failure, pancreatitis, peripheral neuropathy from diabetes, coronary disease with coronary angioplasty and stents,  1. ESRD. [employer]. T-T-S-1. [facility] - We will plan for extra dialysis tomorrow for volume removal 2. Shortness of breath Pneumonia versus volume overload from pulmonary edema Reevaluate after volume removal with dialysis 3. Anemia of chronic kidney disease We'll continue low-dose Procrit with dialysis 4. Secondary hyperparathyroidism Monitor phosphorus during admission   LOS: 0 Seraphim Affinito 3/14/20175:27 PM

## 2015-09-30 ENCOUNTER — Ambulatory Visit: Payer: Medicaid Other

## 2015-09-30 LAB — CBC
HCT: 29 % — ABNORMAL LOW (ref 35.0–47.0)
HEMOGLOBIN: 9.6 g/dL — AB (ref 12.0–16.0)
MCH: 29.7 pg (ref 26.0–34.0)
MCHC: 33.1 g/dL (ref 32.0–36.0)
MCV: 89.8 fL (ref 80.0–100.0)
Platelets: 183 10*3/uL (ref 150–440)
RBC: 3.23 MIL/uL — ABNORMAL LOW (ref 3.80–5.20)
RDW: 18 % — ABNORMAL HIGH (ref 11.5–14.5)
WBC: 11.3 10*3/uL — ABNORMAL HIGH (ref 3.6–11.0)

## 2015-09-30 LAB — TROPONIN I: TROPONIN I: 0.23 ng/mL — AB (ref <0.031)

## 2015-09-30 LAB — GLUCOSE, CAPILLARY
GLUCOSE-CAPILLARY: 393 mg/dL — AB (ref 65–99)
Glucose-Capillary: 192 mg/dL — ABNORMAL HIGH (ref 65–99)
Glucose-Capillary: 227 mg/dL — ABNORMAL HIGH (ref 65–99)
Glucose-Capillary: 357 mg/dL — ABNORMAL HIGH (ref 65–99)
Glucose-Capillary: 352 mg/dL — ABNORMAL HIGH (ref 65–99)

## 2015-09-30 LAB — BASIC METABOLIC PANEL
ANION GAP: 10 (ref 5–15)
BUN: 36 mg/dL — ABNORMAL HIGH (ref 6–20)
CALCIUM: 7.9 mg/dL — AB (ref 8.9–10.3)
CO2: 27 mmol/L (ref 22–32)
Chloride: 97 mmol/L — ABNORMAL LOW (ref 101–111)
Creatinine, Ser: 4.21 mg/dL — ABNORMAL HIGH (ref 0.44–1.00)
GFR, EST AFRICAN AMERICAN: 12 mL/min — AB (ref >60)
GFR, EST NON AFRICAN AMERICAN: 10 mL/min — AB (ref >60)
GLUCOSE: 232 mg/dL — AB (ref 65–99)
Potassium: 3.7 mmol/L (ref 3.5–5.1)
SODIUM: 134 mmol/L — AB (ref 135–145)

## 2015-09-30 LAB — MRSA PCR SCREENING: MRSA BY PCR: POSITIVE — AB

## 2015-09-30 MED ORDER — CHLORHEXIDINE GLUCONATE CLOTH 2 % EX PADS
6.0000 | MEDICATED_PAD | Freq: Every day | CUTANEOUS | Status: DC
  Administered 2015-09-30 – 2015-10-02 (×3): 6 via TOPICAL

## 2015-09-30 MED ORDER — MUPIROCIN 2 % EX OINT
1.0000 "application " | TOPICAL_OINTMENT | Freq: Two times a day (BID) | CUTANEOUS | Status: DC
  Administered 2015-09-30 – 2015-10-02 (×4): 1 via NASAL
  Filled 2015-09-30: qty 22

## 2015-09-30 MED ORDER — EPOETIN ALFA 10000 UNIT/ML IJ SOLN
8000.0000 [IU] | INTRAMUSCULAR | Status: DC
  Administered 2015-10-01: 8000 [IU] via INTRAVENOUS

## 2015-09-30 NOTE — Progress Notes (Signed)
Subjective:  Patient is known to our practice from outpatient. She dialyzes at [facility] Tuesday/Thursday/Saturday She reports that she was sent from the dialysis center for episode of unresponsiveness after she finished dialysis. She also reports significant amount of coughing and difficulty producing sputum Chest x-ray suggests possible right lower lobe consolidation. She is admitted for further evaluation and management Patient denies any fevers or chills Patient feels a lot better today.  Urine output of 1700 cc is reported Able to breathe better  Objective:  Vital signs in last 24 hours:  Temp:  [98.1 F (36.7 C)-98.6 F (37 C)] 98.1 F (36.7 C) (03/15 1100) Pulse Rate:  [79-89] 80 (03/15 1100) Resp:  [19-20] 19 (03/15 1100) BP: (155-180)/(63-76) 155/67 mmHg (03/15 1100) SpO2:  [95 %-96 %] 95 % (03/15 1100)  Weight change:  Filed Weights   09/29/15 1136  Weight: 80 kg (176 lb 5.9 oz)    Intake/Output:    Intake/Output Summary (Last 24 hours) at 09/30/15 1559 Last data filed at 09/30/15 1135  Gross per 24 hour  Intake      0 ml  Output   1875 ml  Net  -1875 ml     Physical Exam: General: Sitting up in the bed,   HEENT Anicteric, moist oral mucous membranes   Neck Supple   Pulm/lungs Room air,decreased breath sounds, clear   CVS/Heart Irregular   Abdomen:  Soft, nontender   Extremities: + edema   Neurologic: Alert, oriented, speech normal   Skin: Chronic wound on the back   Access: AV fistula left arm        Basic Metabolic Panel:   Recent Labs Lab 09/29/15 1208 09/29/15 1736 09/30/15 0548  NA 134*  --  134*  K 3.2*  --  3.7  CL 93*  --  97*  CO2 31  --  27  GLUCOSE 199*  --  232*  BUN 20  --  36*  CREATININE 2.94* 3.31* 4.21*  CALCIUM 8.2*  --  7.9*     CBC:  Recent Labs Lab 09/29/15 1208 09/29/15 1736 09/30/15 0548  WBC 12.2* 12.2* 11.3*  HGB 9.7* 9.7* 9.6*  HCT 29.8* 29.8* 29.0*  MCV 88.6 90.3 89.8  PLT 181  176 183      Microbiology:  Recent Results (from the past 720 hour(s))  Blood culture (routine x 2)     Status: None (Preliminary result)   Collection Time: 09/29/15  1:35 PM  Result Value Ref Range Status   Specimen Description BLOOD RIGHT ASSIST CONTROL  Final   Special Requests BOTTLES DRAWN AEROBIC AND ANAEROBIC 2CCAERO,4CCANA  Final   Culture NO GROWTH < 24 HOURS  Final   Report Status PENDING  Incomplete  Blood culture (routine x 2)     Status: None (Preliminary result)   Collection Time: 09/29/15  1:59 PM  Result Value Ref Range Status   Specimen Description BLOOD RIGHT ASSIST CONTROL  Final   Special Requests BOTTLES DRAWN AEROBIC AND ANAEROBIC 5CCAERO,4CCANA  Final   Culture NO GROWTH < 24 HOURS  Final   Report Status PENDING  Incomplete  MRSA PCR Screening     Status: Abnormal   Collection Time: 09/30/15  5:55 AM  Result Value Ref Range Status   MRSA by PCR POSITIVE (A) NEGATIVE Final    Comment:        The GeneXpert MRSA Assay (FDA approved for NASAL specimens only), is one component of a comprehensive MRSA colonization surveillance  program. It is not intended to diagnose MRSA infection nor to guide or monitor treatment for MRSA infections. CRITICAL RESULT CALLED TO, READ BACK BY AND VERIFIED WITH: ANITA HARRIS AT 0807 ON 09/30/15.Marland KitchenMarland KitchenCornerstone Hospital [facility]     Coagulation Studies: No results for input(s): LABPROT, INR in the last 72 hours.  Urinalysis:  Recent Labs  09/29/15 1219  COLORURINE YELLOW*  LABSPEC 1.010  PHURINE 7.0  GLUCOSEU 150*  HGBUR 1+*  BILIRUBINUR NEGATIVE  KETONESUR TRACE*  PROTEINUR >500*  NITRITE NEGATIVE  LEUKOCYTESUR NEGATIVE      Imaging: Dg Chest 2 View  09/29/2015  CLINICAL DATA:  Felt weak after dialysis, cough, drowsy, history diabetes mellitus, hypertension, end-stage renal disease, coronary artery disease post MI and coronary PTCA, atrial fibrillation, CHF EXAM: CHEST  2 VIEW COMPARISON:  09/13/2014 FINDINGS: Enlargement of cardiac  silhouette with pulmonary vascular congestion. Atherosclerotic calcification aorta. Increased interstitial markings since previous exam question pulmonary edema. LEFT basilar atelectasis. RIGHT basilar atelectasis versus infiltrate. No pleural effusion or pneumothorax. Bones demineralized. IMPRESSION: Enlargement of cardiac silhouette with pulmonary vascular congestion and suspect pulmonary edema. LEFT basilar atelectasis with atelectasis versus consolidation RIGHT lower lobe. Electronically Signed   By: Lavonia Dana M.D.   On: 09/29/2015 12:55     Medications:     . aspirin EC  81 mg Oral Daily  . atorvastatin  20 mg Oral QPM  . baclofen  5 mg Oral QHS  . calcium acetate  1,334 mg Oral TID WC  . carvedilol  6.25 mg Oral BID  . [START ON 10/01/2015] ceFEPime (MAXIPIME) IV  2 g Intravenous Once per day on Tue Thu Sat  . Chlorhexidine Gluconate Cloth  6 each Topical Q0600  . clopidogrel  75 mg Oral QPM  . famotidine  20 mg Oral QPM  . fluticasone  1 spray Each Nare Daily  . furosemide  40 mg Oral Q M,W,F  . furosemide  80 mg Oral Q T,Th,S,Su  . heparin  5,000 Units Subcutaneous 3 times per day  . insulin aspart  0-9 Units Subcutaneous TID WC  . insulin glargine  30 Units Subcutaneous QHS  . irbesartan  150 mg Oral Daily  . [START ON 10/01/2015] levofloxacin (LEVAQUIN) IV  500 mg Intravenous Q48H  . levothyroxine  50 mcg Oral QHS  . magnesium oxide  400 mg Oral QPM  . metoCLOPramide  10 mg Oral QHS  . multivitamin  1 tablet Oral Daily  . mupirocin ointment  1 application Nasal BID  . mycophenolate  500 mg Oral BID  . predniSONE  10 mg Oral Daily  . pregabalin  25 mg Oral BID  . pregabalin  50 mg Oral QHS  . rOPINIRole  0.25 mg Oral Once per day on Sun Tue Thu Sat  . rOPINIRole  0.25 mg Oral 2 times per day on Mon Wed Fri  . sodium chloride flush  3 mL Intravenous Q12H  . [START ON 10/01/2015] vancomycin  750 mg Intravenous Q T,Th,Sa-HD   acetaminophen, clobetasol cream,  HYDROcodone-acetaminophen, meclizine, polyethylene glycol  Assessment/ Plan:  60 y.o. female with end-stage renal disease, diabetes, with complications of retinopathy, GERD, hyperlipidemia, glaucoma, legal blindness, history of hysterectomy, right foot surgery, atrial fibrillation, COPD, congestive heart failure, pancreatitis, peripheral neuropathy from diabetes, coronary disease with coronary angioplasty and stents,  1. ESRD. [employer]. T-T-S-1. [facility] - no need for extra dialysis today - For routine dialysis tomorrow morning  2. Shortness of breath Pneumonia versus volume overload from pulmonary edema improved  today  3. Anemia of chronic kidney disease We'll continue low-dose Procrit with dialysis  4. Secondary hyperparathyroidism Monitor phosphorus during admission   LOS: 1 Keanan Melander 3/15/20173:59 PM

## 2015-09-30 NOTE — Progress Notes (Signed)
md notified of troponin results, and wound on pt back. Wound consult orders and 1 more troponin level ordered

## 2015-09-30 NOTE — Clinical Social Work Note (Signed)
Clinical Social Work Assessment  Patient Details  Name: ZEPHANIAH ENYEART MRN: [id number] Date of Birth: 12-07-1955  Date of referral:  09/30/15               Reason for consult:  Facility Placement                Permission sought to share information with:  Facility Art therapist granted to share information::  Yes, Verbal Permission Granted  Name::        Agency::     Relationship::     Contact Information:     Housing/Transportation Living arrangements for the past 2 months:  [place] of Information:  Patient Patient Interpreter Needed:  None Criminal Activity/Legal Involvement Pertinent to Current Situation/Hospitalization:  No - Comment as needed Significant Relationships:  Spouse Lives with:  Facility Resident Do you feel safe going back to the place where you live?  Yes Need for family participation in patient care:  No (Coment)  Care giving concerns:  Patient resides at [facility] for LTC.   Social Worker assessment / plan:  CSW met with patient and explained role of CSW and purpose of visit. Patient was smiling and eager to talk with CSW. Patient reports she lives at [facility] and that she lives there because of her husband and the fact that he is a bilateral amputee and she cannot care for him at home. Patient states that enjoys it there and is pleased with her care there. She wishes to return when time and states she will need to go by EMS.   Employment status:  Disabled (Comment on whether or not currently receiving Disability) Insurance information:  Managed Medicare PT Recommendations:  Not assessed at this time Information / Referral to community resources:     Patient/Family's Response to care:  Patient expressed appreciation for CSW visit.   Patient/Family's Understanding of and Emotional Response to Diagnosis, Current Treatment, and Prognosis:  Patient states she feels much better than when she came in and that the only thing that  bothers her right now is when she has a coughing spell.   Emotional Assessment Appearance:  Appears stated age Attitude/Demeanor/Rapport:   (pleasant and cooperative) Affect (typically observed):  Accepting, Calm, Happy Orientation:  Oriented to Self, Oriented to Place, Oriented to  Time, Oriented to Situation Alcohol / Substance use:  Not Applicable Psych involvement (Current and /or in the community):  No (Comment)  Discharge Needs  Concerns to be addressed:  Care Coordination Readmission within the last 30 days:  No Current discharge risk:  None Barriers to Discharge:  No Barriers Identified   Shela Leff, LCSW 09/30/2015, 3:12 PM

## 2015-09-30 NOTE — Evaluation (Signed)
Physical Therapy Evaluation Patient Details Name: [name] MRN: [id number] DOB: 10-26-1955 Today's Date: 09/30/2015   History of Present Illness  Pt admitted for pneumonia. Pt with history of ESRD, DM, legal blindness, neuropathy, and COPD. Pt currently lives at [facility] with husband who is a B amputee.   Clinical Impression  Pt is a pleasant 60 year old female who was admitted for pneumonia. Pt performs bed mobility independently, transfers with supervision, and ambulation with cga and rw. Pt demonstrates deficits with strength/endurance. Would benefit from skilled PT to address above deficits and promote optimal return to PLOF. Pt very motivated to perform therapy.      Follow Up Recommendations No PT follow up    Equipment Recommendations       Recommendations for Other Services       Precautions / Restrictions Precautions Precautions: Fall Restrictions Weight Bearing Restrictions: No      Mobility  Bed Mobility Overal bed mobility: Independent             General bed mobility comments: safe technique performed  Transfers Overall transfer level: Needs assistance Equipment used: Rolling walker (2 wheeled) Transfers: Sit to/from Stand Sit to Stand: Supervision         General transfer comment: safe technique performed with RW. No LOB noted  Ambulation/Gait Ambulation/Gait assistance: Min guard Ambulation Distance (Feet): 100 Feet Assistive device: Rolling walker (2 wheeled) Gait Pattern/deviations: Step-through pattern     General Gait Details: ambulated using rw and reciprocal gait pattern. Pt demonstrates safe technique with good speed. No LOB noted, however pt does fatigue and starts coughing with exertion.   Stairs            Wheelchair Mobility    Modified Rankin (Stroke Patients Only)       Balance Overall balance assessment: Modified Independent                                            Pertinent Vitals/Pain Pain Assessment: No/denies pain    Home Living Family/patient expects to be discharged to:: Skilled nursing facility Living Arrangements: Spouse/significant other Available Help at Discharge: [place];Available 24 hours/day Type of Home: [place] Access: Level entry       Home Equipment: Walker - 4 wheels      Prior Function Level of Independence: Independent with assistive device(s)         Comments: uses rollater     Hand Dominance        Extremity/Trunk Assessment   Upper Extremity Assessment: Overall WFL for tasks assessed           Lower Extremity Assessment: Generalized weakness (grossly 4+/5)         Communication   Communication: No difficulties  Cognition Arousal/Alertness: Awake/alert Behavior During Therapy: WFL for tasks assessed/performed Overall Cognitive Status: Within Functional Limits for tasks assessed                      General Comments      Exercises Other Exercises Other Exercises: Seated ther-ex performed on B LE x 10 reps including alt. marching, hip add squeezes, and LAQ. All ther-ex performed with supervision and pt educated to continue ther-ex to maintain strength      Assessment/Plan    PT Assessment Patient needs continued PT services  PT Diagnosis Difficulty  walking;Generalized weakness   PT Problem List Decreased strength;Decreased mobility  PT Treatment Interventions Gait training;Therapeutic exercise   PT Goals (Current goals can be found in the Care Plan section) Acute Rehab PT Goals Patient Stated Goal: to not cough anymore PT Goal Formulation: With patient Time For Goal Achievement: 10/14/15 Potential to Achieve Goals: Good    Frequency Min 2X/week   Barriers to discharge        Co-evaluation               End of Session Equipment Utilized During Treatment: Gait belt Activity Tolerance: Patient tolerated treatment  well Patient left: in chair Nurse Communication: Mobility status         Time: [id number] PT Time Calculation (min) (ACUTE ONLY): 23 min   Charges:   PT Evaluation $PT Eval Moderate Complexity: 1 Procedure PT Treatments $Therapeutic Exercise: 8-22 mins   PT G Codes:        Declynn Lopresti 10-29-2015, 11:45 AM  Greggory Stallion, PT, DPT [phone]

## 2015-09-30 NOTE — Progress Notes (Signed)
MD notified of elevated troponin (.23) this am.
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
## 2015-09-30 NOTE — Progress Notes (Signed)
[facility] at [place] NAME: [name]    MR#:  [id number]  DATE OF BIRTH:  April 17, 1956  SUBJECTIVE:  CHIEF COMPLAINT:  Patient is feeling better. A few coughing spells otherwise no complaints  REVIEW OF SYSTEMS:  CONSTITUTIONAL: No fever, fatigue or weakness.  EYES: No blurred or double vision.  EARS, NOSE, AND THROAT: No tinnitus or ear pain.  RESPIRATORY: Positive cough, exertional shortness of breath, denies wheezing or hemoptysis.  CARDIOVASCULAR: No chest pain, orthopnea, edema.  GASTROINTESTINAL: No nausea, vomiting, diarrhea or abdominal pain.  GENITOURINARY: No dysuria, hematuria.  ENDOCRINE: No polyuria, nocturia,  HEMATOLOGY: No anemia, easy bruising or bleeding SKIN: No rash or lesion. MUSCULOSKELETAL: No joint pain or arthritis.   NEUROLOGIC: No tingling, numbness, weakness.  PSYCHIATRY: No anxiety or depression.   DRUG ALLERGIES:   Allergies  Allergen Reactions  . Adhesive [Tape] Other (See Comments)    Reaction:  Blisters   . Shellfish Allergy Other (See Comments)    Reaction:  Unknown   . Doxycycline Rash    VITALS:  Blood pressure 164/66, pulse 80, temperature 98.1 F (36.7 C), temperature source Oral, resp. rate 19, height 5\' 1"  (1.549 m), weight 80 kg (176 lb 5.9 oz), SpO2 95 %.  PHYSICAL EXAMINATION:  GENERAL:  60 y.o.-year-old patient lying in the bed with no acute distress.  EYES: Pupils equal, round, reactive to light and accommodation. No scleral icterus. Extraocular muscles intact.  HEENT: Head atraumatic, normocephalic. Oropharynx and nasopharynx clear.  NECK:  Supple, no jugular venous distention. No thyroid enlargement, no tenderness.  LUNGS: moderate breath  sounds bilaterally, no wheezing, rales,rhonchi or crepitation. No use of accessory muscles of respiration.  CARDIOVASCULAR: S1, S2 normal. No murmurs, rubs, or gallops.  ABDOMEN: Soft, nontender, nondistended. Bowel sounds present. No  organomegaly or mass.  EXTREMITIES: No pedal edema, cyanosis, or clubbing.  NEUROLOGIC: Cranial nerves II through XII are intact. Muscle strength 5/5 in all extremities. Sensation intact. Gait not checked.  PSYCHIATRIC: The patient is alert and oriented x 3.  SKIN: No obvious rash, lesion, or ulcer.    LABORATORY PANEL:   CBC  Recent Labs Lab 09/30/15 0548  WBC 11.3*  HGB 9.6*  HCT 29.0*  PLT 183   ------------------------------------------------------------------------------------------------------------------  Chemistries   Recent Labs Lab 09/29/15 1208  09/30/15 0548  NA 134*  --  134*  K 3.2*  --  3.7  CL 93*  --  97*  CO2 31  --  27  GLUCOSE 199*  --  232*  BUN 20  --  36*  CREATININE 2.94*  < > 4.21*  CALCIUM 8.2*  --  7.9*  AST 25  --   --   ALT 25  --   --   ALKPHOS 82  --   --   BILITOT 0.9  --   --   < > = values in this interval not displayed. ------------------------------------------------------------------------------------------------------------------  Cardiac Enzymes  Recent Labs Lab 09/30/15 0548  TROPONINI 0.23*   ------------------------------------------------------------------------------------------------------------------  RADIOLOGY:  Dg Chest 2 View  09/29/2015  CLINICAL DATA:  Felt weak after dialysis, cough, drowsy, history diabetes mellitus, hypertension, end-stage renal disease, coronary artery disease post MI and coronary PTCA, atrial fibrillation, CHF EXAM: CHEST  2 VIEW COMPARISON:  09/13/2014 FINDINGS: Enlargement of cardiac silhouette with pulmonary vascular congestion. Atherosclerotic calcification aorta. Increased interstitial markings since previous exam question pulmonary edema. LEFT basilar atelectasis. RIGHT basilar atelectasis versus infiltrate. No pleural effusion or  pneumothorax. Bones demineralized. IMPRESSION: Enlargement of cardiac silhouette with pulmonary vascular congestion and suspect pulmonary edema. LEFT basilar  atelectasis with atelectasis versus consolidation RIGHT lower lobe. Electronically Signed   By: Lavonia Dana M.D.   On: 09/29/2015 12:55    EKG:   Orders placed or performed during the hospital encounter of 09/29/15  . EKG 12-Lead  . EKG 12-Lead    ASSESSMENT AND PLAN:     * Pneumonia Clinically feeling better Continue broad-spectrum antibiotic as patient is from nursing home and she is on hemodialysis. She is not able to give sputum sample as per her. Blood cultures no growth in 24 hours 2  * End-stage renal disease on hemodialysis Received hemodialysis yesterday, patient will get additional dialysis today for excess volume removal . Appreciate nephrology consult.  * Diabetes Continue basal insulin and keep on insulin sliding scale coverage.  * Coronary artery disease and CHF Continue home medications.  * Hypertension Continue home medications. PT consult -no PT follow-up needed  All the records are reviewed and case discussed with Care Management/Social Workerr. Management plans discussed with the patient, family and they are in agreement.  CODE STATUS: Full code  TOTAL TIME TAKING CARE OF THIS PATIENT: 35 minutes.   POSSIBLE D/C IN 1-2 DAYS, DEPENDING ON CLINICAL CONDITION.   Nicholes Mango M.D on 09/30/2015 at 3:06 PM  Between 7am to 6pm - Pager - [phone] After 6pm go to [email or web address] - password [email or web address] [facility]  Office  [phone]  CC: Primary care physician; Marden Noble, MD

## 2015-09-30 NOTE — NC FL2 (Signed)
[place] LEVEL OF CARE SCREENING TOOL     IDENTIFICATION  [name] Birthdate: Mar 10, 1956 Sex: female Admission Date (Current Location): 09/29/2015  [facility] and [place] Number:  Engineering geologist and Address:  [facility], [address] [place], [place] [address]      Provider Number: [id number]  Attending Physician Name and Address:  Nicholes Mango, MD  Relative Name and Phone Number:       Current Level of Care: Hospital Recommended Level of Care: [place] Prior Approval Number:    Date Approved/Denied:   PASRR Number:    Discharge Plan: SNF    Current Diagnoses: Patient Active Problem List   Diagnosis Date Noted  . Pneumonia 09/29/2015  . Stage II pressure ulcer of right upper back 09/04/2015  . Stage II pressure ulcer of back 07/17/2015  . Acute coronary syndrome ([place]) 05/20/2014    Orientation RESPIRATION BLADDER Height & Weight     Self, Time, Situation, Place  Normal Continent Weight: 176 lb 5.9 oz (80 kg) Height:  5\' 1"  (154.9 cm)  BEHAVIORAL SYMPTOMS/MOOD NEUROLOGICAL BOWEL NUTRITION STATUS   (none)  (none) Continent Diet (heart healthy/carb modified)  AMBULATORY STATUS COMMUNICATION OF NEEDS Skin   Limited Assist Verbally Normal                       Personal Care Assistance Level of Assistance              Functional Limitations Info             SPECIAL CARE FACTORS FREQUENCY                       Contractures Contractures Info: Not present    Additional Factors Info  Code Status Code Status Info: FULL             Current Medications (09/30/2015):  This is the current hospital active medication list Current Facility-Administered Medications  Medication Dose Route Frequency Provider Last Rate Last Dose  . acetaminophen (TYLENOL) tablet 650 mg  650 mg Oral Q6H PRN Vaughan Basta, MD      . aspirin EC tablet 81 mg  81 mg Oral Daily  Vaughan Basta, MD   81 mg at 09/30/15 0902  . atorvastatin (LIPITOR) tablet 20 mg  20 mg Oral QPM Vaughan Basta, MD   20 mg at 09/29/15 1703  . baclofen (LIORESAL) tablet 5 mg  5 mg Oral QHS Vaughan Basta, MD   5 mg at 09/29/15 2158  . calcium acetate (PHOSLO) capsule 1,334 mg  1,334 mg Oral TID WC Murlean Iba, MD   1,334 mg at 09/30/15 1229  . carvedilol (COREG) tablet 6.25 mg  6.25 mg Oral BID Vaughan Basta, MD   6.25 mg at 09/30/15 0902  . [START ON 10/01/2015] ceFEPIme (MAXIPIME) 2 g in dextrose 5 % 50 mL IVPB  2 g Intravenous Once per day on Tue Thu Sat Vaughan Basta, MD      . Chlorhexidine Gluconate Cloth 2 % PADS 6 each  6 each Topical Q0600 Vaughan Basta, MD   6 each at 09/30/15 0830  . clobetasol cream (TEMOVATE) [id number] % 1 application  1 application Topical BID PRN Vaughan Basta, MD      . clopidogrel (PLAVIX) tablet 75 mg  75 mg Oral QPM Vaughan Basta, MD   75 mg at 09/29/15 1703  . famotidine (PEPCID) tablet 20  mg  20 mg Oral QPM Vaughan Basta, MD   20 mg at 09/29/15 1704  . fluticasone (FLONASE) 50 MCG/ACT nasal spray 1 spray  1 spray Each Nare Daily Vaughan Basta, MD   1 spray at 09/29/15 1750  . furosemide (LASIX) tablet 40 mg  40 mg Oral Q M,W,F Vaughan Basta, MD   40 mg at 09/30/15 0902  . furosemide (LASIX) tablet 80 mg  80 mg Oral Q T,Th,S,Su Vaughan Basta, MD   80 mg at 09/29/15 1703  . heparin injection 5,000 Units  5,000 Units Subcutaneous 3 times per day Vaughan Basta, MD   5,000 Units at 09/29/15 2158  . HYDROcodone-acetaminophen (NORCO/VICODIN) 5-325 MG per tablet 1 tablet  1 tablet Oral Q6H PRN Vaughan Basta, MD      . insulin aspart (novoLOG) injection 0-9 Units  0-9 Units Subcutaneous TID WC Vaughan Basta, MD   3 Units at 09/30/15 1230  . insulin glargine (LANTUS) injection 30 Units  30 Units Subcutaneous QHS Vaughan Basta, MD   30 Units at  09/29/15 2156  . irbesartan (AVAPRO) tablet 150 mg  150 mg Oral Daily Harmeet Singh, MD   150 mg at 09/30/15 0900  . [START ON 10/01/2015] levofloxacin (LEVAQUIN) IVPB 500 mg  500 mg Intravenous Q48H Vaughan Basta, MD      . levothyroxine (SYNTHROID, LEVOTHROID) tablet 50 mcg  50 mcg Oral QHS Vaughan Basta, MD   50 mcg at 09/29/15 2156  . magnesium oxide (MAG-OX) tablet 400 mg  400 mg Oral QPM Vaughan Basta, MD   400 mg at 09/29/15 1704  . meclizine (ANTIVERT) tablet 25 mg  25 mg Oral Q6H PRN Vaughan Basta, MD      . metoCLOPramide (REGLAN) tablet 10 mg  10 mg Oral QHS Vaughan Basta, MD   10 mg at 09/29/15 2156  . multivitamin (RENA-VIT) tablet 1 tablet  1 tablet Oral Daily Vaughan Basta, MD   1 tablet at 09/30/15 0901  . mupirocin ointment (BACTROBAN) 2 % 1 application  1 application Nasal BID Vaughan Basta, MD   1 application at [id number]  . mycophenolate (CELLCEPT) capsule 500 mg  500 mg Oral BID Vaughan Basta, MD   500 mg at 09/30/15 0913  . polyethylene glycol (MIRALAX / GLYCOLAX) packet 17 g  17 g Oral Daily PRN Vaughan Basta, MD      . predniSONE (DELTASONE) tablet 10 mg  10 mg Oral Daily Vaughan Basta, MD   10 mg at 09/30/15 0901  . pregabalin (LYRICA) capsule 25 mg  25 mg Oral BID Vaughan Basta, MD   25 mg at 09/30/15 0901  . pregabalin (LYRICA) capsule 50 mg  50 mg Oral QHS Vaughan Basta, MD   50 mg at 09/29/15 2158  . rOPINIRole (REQUIP) tablet 0.25 mg  0.25 mg Oral Once per day on Sun Tue Thu Sat Vaughan Basta, MD   0.25 mg at 09/29/15 1704  . rOPINIRole (REQUIP) tablet 0.25 mg  0.25 mg Oral 2 times per day on Mon Wed Fri Vaughan Basta, MD   0.25 mg at 09/30/15 [id number]  . sodium chloride flush (NS) 0.9 % injection 3 mL  3 mL Intravenous Q12H Vaughan Basta, MD   3 mL at 09/29/15 2157  . [START ON 10/01/2015] vancomycin (VANCOCIN) IVPB 750 mg/150 ml premix  750 mg  Intravenous Q T,Th,Sa-HD Vaughan Basta, MD         Discharge Medications: Please see discharge summary for a list of discharge medications.  Relevant  Imaging Results:  Relevant Lab Results:   Additional Information    Shela Leff, LCSW

## 2015-10-01 LAB — GLUCOSE, CAPILLARY
GLUCOSE-CAPILLARY: 183 mg/dL — AB (ref 65–99)
GLUCOSE-CAPILLARY: 75 mg/dL (ref 65–99)
GLUCOSE-CAPILLARY: 276 mg/dL — AB (ref 65–99)

## 2015-10-01 LAB — PHOSPHORUS: Phosphorus: 2.9 mg/dL (ref 2.5–4.6)

## 2015-10-01 MED ORDER — INSULIN ASPART 100 UNIT/ML ~~LOC~~ SOLN
0.0000 [IU] | Freq: Three times a day (TID) | SUBCUTANEOUS | Status: DC
  Administered 2015-10-02: 3 [IU] via SUBCUTANEOUS
  Administered 2015-10-02: 5 [IU] via SUBCUTANEOUS
  Filled 2015-10-01: qty 3
  Filled 2015-10-01: qty 5

## 2015-10-01 MED ORDER — AMOXICILLIN 500 MG PO CAPS
500.0000 mg | ORAL_CAPSULE | ORAL | Status: DC
  Administered 2015-10-02: 500 mg via ORAL
  Filled 2015-10-01: qty 1

## 2015-10-01 MED ORDER — GUAIFENESIN-CODEINE 100-10 MG/5ML PO SOLN
10.0000 mL | ORAL | Status: DC | PRN
  Administered 2015-10-01: 10 mL via ORAL
  Filled 2015-10-01: qty 10

## 2015-10-01 MED ORDER — INSULIN ASPART 100 UNIT/ML ~~LOC~~ SOLN: 0.0000 [IU] | Freq: Every day | SUBCUTANEOUS | Status: DC

## 2015-10-01 NOTE — Progress Notes (Signed)
Hemodialysis start 

## 2015-10-01 NOTE — Progress Notes (Signed)
Hemodialysis completed. 

## 2015-10-01 NOTE — Progress Notes (Signed)
Inpatient Diabetes Program Recommendations  AACE/ADA: New Consensus Statement on Inpatient Glycemic Control (2015)  Target Ranges:  Prepandial:   less than 140 mg/dL      Peak postprandial:   less than 180 mg/dL (1-2 hours)      Critically ill patients:  140 - 180 mg/dL   Results for DELSA, MILNOR (MRN [id number]) as of 10/01/2015 12:19  Ref. Range 09/30/2015 07:46 09/30/2015 11:36 09/30/2015 16:54 09/30/2015 18:53 09/30/2015 21:33  Glucose-Capillary Latest Ref Range: 65-99 mg/dL 192 (H) 227 (H) 357 (H) 393 (H) 352 (H)    Home DM Meds: Lantus 35 units QHS       Humalog 4-10 units tid per SSI  Current Insulin Orders: Lantus 30 units QHS      Novolog Sensitive Correction Scale/ SSI (0-9 units) TID AC      MD- Note patient eating 100% of meals so far.  Had elevated postprandial glucose levels yesterday.  Note patient having dialysis this afternoon.  If patient continues to have issues with her postprandial CBGs, please consider starting low dose Novolog Meal Coverage-  Novolog 4 units tid with meals (Use Glycemic Control Order set)     --Will follow patient during hospitalization--  Wyn Quaker RN, MSN, CDE Diabetes Coordinator Inpatient Glycemic Control Team Team Pager: [phone] (8a-5p)

## 2015-10-01 NOTE — Progress Notes (Signed)
Post hd tx 

## 2015-10-01 NOTE — Progress Notes (Signed)
PT Cancellation Note  Patient Details Name: [name] MRN: [id number] DOB: [birth year]   Cancelled Treatment:    Reason Eval/Treat Not Completed: Patient at procedure or test/unavailable. Pt in hemodialysis this afternoon. Re attempt treatment tomorrow.    Charlaine Dalton, [place] 10/01/2015, 2:53 PM

## 2015-10-01 NOTE — Progress Notes (Signed)
Pre-hd tx 

## 2015-10-01 NOTE — Progress Notes (Signed)
[place] [facility] [place] NAME: [name]    MR#:  [id number]  DATE OF BIRTH:  30-Dec-1955  SUBJECTIVE:  CHIEF COMPLAINT:  Patient is feeling better. Had hemodialysis today he  REVIEW OF SYSTEMS:  CONSTITUTIONAL: No fever, fatigue or weakness.  EYES: No blurred or double vision.  EARS, NOSE, AND THROAT: No tinnitus or ear pain.  RESPIRATORY: Positive cough, exertional shortness of breath, denies wheezing or hemoptysis.  CARDIOVASCULAR: No chest pain, orthopnea, edema.  GASTROINTESTINAL: No nausea, vomiting, diarrhea or abdominal pain.  GENITOURINARY: No dysuria, hematuria.  ENDOCRINE: No polyuria, nocturia,  HEMATOLOGY: No anemia, easy bruising or bleeding SKIN: No rash or lesion. MUSCULOSKELETAL: No joint pain or arthritis.   NEUROLOGIC: No tingling, numbness, weakness.  PSYCHIATRY: No anxiety or depression.   DRUG ALLERGIES:   Allergies  Allergen Reactions  . Adhesive [Tape] Other (See Comments)    Reaction:  Blisters   . Shellfish Allergy Other (See Comments)    Reaction:  Unknown   . Doxycycline Rash    VITALS:  Blood pressure 184/68, pulse 82, temperature 99.1 F (37.3 C), temperature source Oral, resp. rate 17, height 5\' 1"  (1.549 m), weight 80 kg (176 lb 5.9 oz), SpO2 97 %.  PHYSICAL EXAMINATION:  GENERAL:  60 y.o.-year-old patient lying in the bed with no acute distress.  EYES: Pupils equal, round, reactive to light and accommodation. No scleral icterus. Extraocular muscles intact.  HEENT: Head atraumatic, normocephalic. Oropharynx and nasopharynx clear.  NECK:  Supple, no jugular venous distention. No thyroid enlargement, no tenderness.  LUNGS: moderate breath  sounds bilaterally, no wheezing, rales,rhonchi or crepitation. No use of accessory muscles of respiration.  CARDIOVASCULAR: S1, S2 normal. No murmurs, rubs, or gallops.  ABDOMEN: Soft, nontender, nondistended. Bowel sounds present. No organomegaly or mass.   EXTREMITIES: No pedal edema, cyanosis, or clubbing.  NEUROLOGIC: Cranial nerves II through XII are intact. Muscle strength 5/5 in all extremities. Sensation intact. Gait not checked.  PSYCHIATRIC: The patient is alert and oriented x 3.  SKIN: No obvious rash, lesion, or ulcer.    LABORATORY PANEL:   CBC  Recent Labs Lab 09/30/15 0548  WBC 11.3*  HGB 9.6*  HCT 29.0*  PLT 183   ------------------------------------------------------------------------------------------------------------------  Chemistries   Recent Labs Lab 09/29/15 1208  09/30/15 0548  NA 134*  --  134*  K 3.2*  --  3.7  CL 93*  --  97*  CO2 31  --  27  GLUCOSE 199*  --  232*  BUN 20  --  36*  CREATININE 2.94*  < > 4.21*  CALCIUM 8.2*  --  7.9*  AST 25  --   --   ALT 25  --   --   ALKPHOS 82  --   --   BILITOT 0.9  --   --   < > = values in this interval not displayed. ------------------------------------------------------------------------------------------------------------------  Cardiac Enzymes  Recent Labs Lab 09/30/15 0548  TROPONINI 0.23*   ------------------------------------------------------------------------------------------------------------------  RADIOLOGY:  No results found.  EKG:   Orders placed or performed during the hospital encounter of 09/29/15  . EKG 12-Lead  . EKG 12-Lead    ASSESSMENT AND PLAN:     * Pneumonia Clinically feeling better Continue broad-spectrum antibiotic as patient is from nursing home and she is on hemodialysis. She is not able to give sputum sample as per her. Blood cultures no growth in   48hours 2  * End-stage renal  disease on hemodialysis Received hemodialysis yesterday, and today as well  Appreciate nephrology consult.  * Diabetes Continue basal insulin and keep on insulin sliding scale coverage.  * Coronary artery disease and CHF Continue home medications.  * Hypertension Continue home medications. PT consult -no PT  follow-up needed  All the records are reviewed and case discussed with Care Management/Social Workerr. Management plans discussed with the patient, family and they are in agreement.  CODE STATUS: Full code  TOTAL TIME TAKING CARE OF THIS PATIENT: 35 minutes.   POSSIBLE D/C IN a.m.  DAYS, DEPENDING ON CLINICAL CONDITION.   Nicholes Mango M.D on 10/01/2015 at 5:11 PM  Between 7am to 6pm - Pager - [phone] After 6pm go to [email or web address] - password [email or web address] [facility]  Office  [phone]  CC: Primary care physician; Marden Noble, MD

## 2015-10-01 NOTE — Progress Notes (Signed)
MD (patel) notified of pt loss of iv access. Since pt likely d/c in the am okay to leave iv out for now. Also notified md of increased bleeding after iv removal. Md does not want to hold am heparin dose for now. Will continue to monitor.

## 2015-10-01 NOTE — Progress Notes (Signed)
Subjective:  Patient is known to our practice from outpatient. She dialyzes at [facility] Tuesday/Thursday/Saturday She reports that she was sent from the dialysis center for episode of unresponsiveness after she finished dialysis. She also reports significant amount of coughing and difficulty producing sputum Chest x-ray suggests possible right lower lobe consolidation. She is admitted for further evaluation and management  Patient denies any fevers or chills Patient feels a lot better today.  Asking about going home. Patient seen during dialysis Tolerating well    HEMODIALYSIS FLOWSHEET:  Blood Flow Rate (mL/min): 400 mL/min Arterial Pressure (mmHg): -170 mmHg Venous Pressure (mmHg): 180 mmHg Transmembrane Pressure (mmHg): 50 mmHg Ultrafiltration Rate (mL/min): 710 mL/min Dialysate Flow Rate (mL/min): 600 ml/min Conductivity: Machine : 13.9 Conductivity: Machine : 13.9 Dialysis Fluid Bolus: Normal Saline Bolus Amount (mL): 250 mL Intra-Hemodialysis Comments: Tx started without complicatins.Access secured/visible.Dialyzing in bed.    Objective:  Vital signs in last 24 hours:  Temp:  [97.9 F (36.6 C)-98 F (36.7 C)] 97.9 F (36.6 C) (03/16 1046) Pulse Rate:  [72-81] 76 (03/16 1100) Resp:  [17-19] 18 (03/16 1100) BP: (158-193)/(55-76) 193/73 mmHg (03/16 1100) SpO2:  [95 %-98 %] 98 % (03/16 1100) Weight:  [82.3 kg (181 lb 7 oz)] 82.3 kg (181 lb 7 oz) (03/16 1046)  Weight change:  Filed Weights   09/29/15 1136 10/01/15 1046  Weight: 80 kg (176 lb 5.9 oz) 82.3 kg (181 lb 7 oz)    Intake/Output:    Intake/Output Summary (Last 24 hours) at 10/01/15 1137 Last data filed at 10/01/15 1040  Gross per 24 hour  Intake    220 ml  Output    925 ml  Net   -705 ml     Physical Exam: General: Sitting up in the bed,   HEENT Anicteric, moist oral mucous membranes   Neck Supple   Pulm/lungs Room air,decreased breath sounds, clear   CVS/Heart Irregular    Abdomen:  Soft, nontender   Extremities: + edema   Neurologic: Alert, oriented, speech normal   Skin: Chronic wound on the back   Access: AV fistula left arm        Basic Metabolic Panel:   Recent Labs Lab 09/29/15 1208 09/29/15 1736 09/30/15 0548  NA 134*  --  134*  K 3.2*  --  3.7  CL 93*  --  97*  CO2 31  --  27  GLUCOSE 199*  --  232*  BUN 20  --  36*  CREATININE 2.94* 3.31* 4.21*  CALCIUM 8.2*  --  7.9*     CBC:  Recent Labs Lab 09/29/15 1208 09/29/15 1736 09/30/15 0548  WBC 12.2* 12.2* 11.3*  HGB 9.7* 9.7* 9.6*  HCT 29.8* 29.8* 29.0*  MCV 88.6 90.3 89.8  PLT 181 176 183      Microbiology:  Recent Results (from the past 720 hour(s))  Blood culture (routine x 2)     Status: None (Preliminary result)   Collection Time: 09/29/15  1:35 PM  Result Value Ref Range Status   Specimen Description BLOOD RIGHT ASSIST CONTROL  Final   Special Requests BOTTLES DRAWN AEROBIC AND ANAEROBIC 2CCAERO,4CCANA  Final   Culture NO GROWTH 2 DAYS  Final   Report Status PENDING  Incomplete  Blood culture (routine x 2)     Status: None (Preliminary result)   Collection Time: 09/29/15  1:59 PM  Result Value Ref Range Status   Specimen Description BLOOD RIGHT ASSIST CONTROL  Final  Special Requests BOTTLES DRAWN AEROBIC AND ANAEROBIC 5CCAERO,4CCANA  Final   Culture NO GROWTH 2 DAYS  Final   Report Status PENDING  Incomplete  MRSA PCR Screening     Status: Abnormal   Collection Time: 09/30/15  5:55 AM  Result Value Ref Range Status   MRSA by PCR POSITIVE (A) NEGATIVE Final    Comment:        The GeneXpert MRSA Assay (FDA approved for NASAL specimens only), is one component of a comprehensive MRSA colonization surveillance program. It is not intended to diagnose MRSA infection nor to guide or monitor treatment for MRSA infections. CRITICAL RESULT CALLED TO, READ BACK BY AND VERIFIED WITH: ANITA HARRIS AT 0807 ON 09/30/15.Marland KitchenMarland KitchenMedina Memorial Hospital     Coagulation Studies: No  results for input(s): LABPROT, INR in the last 72 hours.  Urinalysis:  Recent Labs  09/29/15 1219  COLORURINE YELLOW*  LABSPEC 1.010  PHURINE 7.0  GLUCOSEU 150*  HGBUR 1+*  BILIRUBINUR NEGATIVE  KETONESUR TRACE*  PROTEINUR >500*  NITRITE NEGATIVE  LEUKOCYTESUR NEGATIVE      Imaging: Dg Chest 2 View  09/29/2015  CLINICAL DATA:  Felt weak after dialysis, cough, drowsy, history diabetes mellitus, hypertension, end-stage renal disease, coronary artery disease post MI and coronary PTCA, atrial fibrillation, CHF EXAM: CHEST  2 VIEW COMPARISON:  09/13/2014 FINDINGS: Enlargement of cardiac silhouette with pulmonary vascular congestion. Atherosclerotic calcification aorta. Increased interstitial markings since previous exam question pulmonary edema. LEFT basilar atelectasis. RIGHT basilar atelectasis versus infiltrate. No pleural effusion or pneumothorax. Bones demineralized. IMPRESSION: Enlargement of cardiac silhouette with pulmonary vascular congestion and suspect pulmonary edema. LEFT basilar atelectasis with atelectasis versus consolidation RIGHT lower lobe. Electronically Signed   By: Lavonia Dana M.D.   On: 09/29/2015 12:55     Medications:     . aspirin EC  81 mg Oral Daily  . atorvastatin  20 mg Oral QPM  . baclofen  5 mg Oral QHS  . calcium acetate  1,334 mg Oral TID WC  . carvedilol  6.25 mg Oral BID  . ceFEPime (MAXIPIME) IV  2 g Intravenous Once per day on Tue Thu Sat  . Chlorhexidine Gluconate Cloth  6 each Topical Q0600  . clopidogrel  75 mg Oral QPM  . epoetin (EPOGEN/PROCRIT) injection  8,000 Units Intravenous Q T,Th,Sa-HD  . famotidine  20 mg Oral QPM  . fluticasone  1 spray Each Nare Daily  . furosemide  40 mg Oral Q M,W,F  . furosemide  80 mg Oral Q T,Th,S,Su  . heparin  5,000 Units Subcutaneous 3 times per day  . insulin aspart  0-9 Units Subcutaneous TID WC  . insulin glargine  30 Units Subcutaneous QHS  . irbesartan  150 mg Oral Daily  . levofloxacin  (LEVAQUIN) IV  500 mg Intravenous Q48H  . levothyroxine  50 mcg Oral QHS  . magnesium oxide  400 mg Oral QPM  . metoCLOPramide  10 mg Oral QHS  . multivitamin  1 tablet Oral Daily  . mupirocin ointment  1 application Nasal BID  . mycophenolate  500 mg Oral BID  . predniSONE  10 mg Oral Daily  . pregabalin  25 mg Oral BID  . pregabalin  50 mg Oral QHS  . rOPINIRole  0.25 mg Oral Once per day on Sun Tue Thu Sat  . rOPINIRole  0.25 mg Oral 2 times per day on Mon Wed Fri  . sodium chloride flush  3 mL Intravenous Q12H  . vancomycin  750 mg  Intravenous Q T,Th,Sa-HD   acetaminophen, clobetasol cream, HYDROcodone-acetaminophen, meclizine, polyethylene glycol  Assessment/ Plan:  61 y.o. female with end-stage renal disease, diabetes, with complications of retinopathy, GERD, hyperlipidemia, glaucoma, legal blindness, history of hysterectomy, right foot surgery, atrial fibrillation, COPD, congestive heart failure, pancreatitis, peripheral neuropathy from diabetes, coronary disease with coronary angioplasty and stents,  1. ESRD. [employer]. T-T-S-1. [facility] - Patient seen during dialysis Tolerating well  Ok to d/c home from renal standpoint  2. Shortness of breath Pneumonia versus volume overload from pulmonary edema improved today  3. Anemia of chronic kidney disease We'll continue low-dose Procrit with dialysis  4. Secondary hyperparathyroidism Monitor phosphorus during admission   LOS: 2 Gearld Kerstein 3/16/201711:37 AM

## 2015-10-02 ENCOUNTER — Ambulatory Visit: Payer: Medicaid Other | Admitting: Surgery

## 2015-10-02 LAB — GLUCOSE, CAPILLARY
Glucose-Capillary: 182 mg/dL — ABNORMAL HIGH (ref 65–99)
Glucose-Capillary: 227 mg/dL — ABNORMAL HIGH (ref 65–99)

## 2015-10-02 MED ORDER — AMOXICILLIN-POT CLAVULANATE 500-125 MG PO TABS
1.0000 | ORAL_TABLET | Freq: Every day | ORAL | Status: DC
  Administered 2015-10-02: 500 mg via ORAL
  Filled 2015-10-02: qty 1

## 2015-10-02 MED ORDER — AMOXICILLIN-POT CLAVULANATE 500-125 MG PO TABS: 1.0000 | ORAL_TABLET | Freq: Every day | ORAL | Status: DC

## 2015-10-02 MED ORDER — MUPIROCIN 2 % EX OINT: 1.0000 "application " | TOPICAL_OINTMENT | Freq: Two times a day (BID) | CUTANEOUS | Status: DC

## 2015-10-02 MED ORDER — IRBESARTAN 150 MG PO TABS
150.0000 mg | ORAL_TABLET | Freq: Every day | ORAL | Status: DC
Start: 2015-10-02 — End: 2016-11-04

## 2015-10-02 MED ORDER — CARVEDILOL 6.25 MG PO TABS: 6.2500 mg | ORAL_TABLET | Freq: Two times a day (BID) | ORAL | Status: DC

## 2015-10-02 MED ORDER — AMOXICILLIN-POT CLAVULANATE 500-125 MG PO TABS: 1.0000 | ORAL_TABLET | Freq: Three times a day (TID) | ORAL | Status: AC

## 2015-10-02 NOTE — Progress Notes (Signed)
Report called to Helayne Seminole at [facility]. Concerns addressed. IV site already removed yesterday. Pt up ambulating with walker. EMS called and to transport patient. Continue to assess till discharge.

## 2015-10-02 NOTE — Discharge Instructions (Signed)
Activity as tolerated  Diet renal and diabetic Follow-up with primary care physician in a week Follow-up with nephrology as recommended and continue hemodialysis on Tuesday, Thursday and Saturday

## 2015-10-02 NOTE — Progress Notes (Signed)
Subjective:  Patient had hematemesis yesterday. Reports that she's feeling significantly better since admission. Diagnosed with right lower lobe pneumonia upon admission.    Objective:  Vital signs in last 24 hours:  Temp:  [98 F (36.7 C)-99.1 F (37.3 C)] 98 F (36.7 C) (03/17 0512) Pulse Rate:  [77-83] 77 (03/17 0512) Resp:  [17-20] 18 (03/17 0512) BP: (168-184)/(56-68) 168/63 mmHg (03/17 0512) SpO2:  [93 %-97 %] 94 % (03/17 0512)  Weight change:  Filed Weights   09/29/15 1136 10/01/15 1046 10/01/15 1439  Weight: 80 kg (176 lb 5.9 oz) 82.3 kg (181 lb 7 oz) 80 kg (176 lb 5.9 oz)    Intake/Output:    Intake/Output Summary (Last 24 hours) at 10/02/15 1514 Last data filed at 10/02/15 [id number]  Gross per 24 hour  Intake      0 ml  Output    650 ml  Net   -650 ml     Physical Exam: General: No acute distress  HEENT Anicteric, moist oral mucous membranes   Neck Supple   Pulm/lungs Basilar rales, normal effort  CVS/Heart Irregular, no rubs  Abdomen:  Soft, nontender   Extremities: + edema   Neurologic: Alert, oriented, speech normal   Skin: Chronic wound on the back   Access: AV fistula left arm        Basic Metabolic Panel:   Recent Labs Lab 09/29/15 1208 09/29/15 1736 09/30/15 0548 10/01/15 1049  NA 134*  --  134*  --   K 3.2*  --  3.7  --   CL 93*  --  97*  --   CO2 31  --  27  --   GLUCOSE 199*  --  232*  --   BUN 20  --  36*  --   CREATININE 2.94* 3.31* 4.21*  --   CALCIUM 8.2*  --  7.9*  --   PHOS  --   --   --  2.9     CBC:  Recent Labs Lab 09/29/15 1208 09/29/15 1736 09/30/15 0548  WBC 12.2* 12.2* 11.3*  HGB 9.7* 9.7* 9.6*  HCT 29.8* 29.8* 29.0*  MCV 88.6 90.3 89.8  PLT 181 176 183      Microbiology:  Recent Results (from the past 720 hour(s))  Blood culture (routine x 2)     Status: None (Preliminary result)   Collection Time: 09/29/15  1:35 PM  Result Value Ref Range Status   Specimen Description BLOOD RIGHT ASSIST  CONTROL  Final   Special Requests BOTTLES DRAWN AEROBIC AND ANAEROBIC 2CCAERO,4CCANA  Final   Culture NO GROWTH 2 DAYS  Final   Report Status PENDING  Incomplete  Blood culture (routine x 2)     Status: None (Preliminary result)   Collection Time: 09/29/15  1:59 PM  Result Value Ref Range Status   Specimen Description BLOOD RIGHT ASSIST CONTROL  Final   Special Requests BOTTLES DRAWN AEROBIC AND ANAEROBIC 5CCAERO,4CCANA  Final   Culture NO GROWTH 2 DAYS  Final   Report Status PENDING  Incomplete  MRSA PCR Screening     Status: Abnormal   Collection Time: 09/30/15  5:55 AM  Result Value Ref Range Status   MRSA by PCR POSITIVE (A) NEGATIVE Final    Comment:        The GeneXpert MRSA Assay (FDA approved for NASAL specimens only), is one component of a comprehensive MRSA colonization surveillance program. It is not intended to diagnose MRSA infection nor to guide or  monitor treatment for MRSA infections. CRITICAL RESULT CALLED TO, READ BACK BY AND VERIFIED WITH: ANITA HARRIS AT 0807 ON 09/30/15.Marland KitchenMarland KitchenChillicothe Va Medical Center     Coagulation Studies: No results for input(s): LABPROT, INR in the last 72 hours.  Urinalysis: No results for input(s): COLORURINE, LABSPEC, PHURINE, GLUCOSEU, HGBUR, BILIRUBINUR, KETONESUR, PROTEINUR, UROBILINOGEN, NITRITE, LEUKOCYTESUR in the last 72 hours.  Invalid input(s): APPERANCEUR    Imaging: No results found.   Medications:     . amoxicillin-clavulanate  1 tablet Oral Daily  . aspirin EC  81 mg Oral Daily  . atorvastatin  20 mg Oral QPM  . baclofen  5 mg Oral QHS  . calcium acetate  1,334 mg Oral TID WC  . carvedilol  6.25 mg Oral BID  . Chlorhexidine Gluconate Cloth  6 each Topical Q0600  . clopidogrel  75 mg Oral QPM  . epoetin (EPOGEN/PROCRIT) injection  8,000 Units Intravenous Q T,Th,Sa-HD  . famotidine  20 mg Oral QPM  . fluticasone  1 spray Each Nare Daily  . furosemide  40 mg Oral Q M,W,F  . furosemide  80 mg Oral Q T,Th,S,Su  . heparin  5,000  Units Subcutaneous 3 times per day  . insulin aspart  0-15 Units Subcutaneous TID WC  . insulin glargine  30 Units Subcutaneous QHS  . irbesartan  150 mg Oral Daily  . levothyroxine  50 mcg Oral QHS  . magnesium oxide  400 mg Oral QPM  . metoCLOPramide  10 mg Oral QHS  . multivitamin  1 tablet Oral Daily  . mupirocin ointment  1 application Nasal BID  . mycophenolate  500 mg Oral BID  . predniSONE  10 mg Oral Daily  . pregabalin  25 mg Oral BID  . pregabalin  50 mg Oral QHS  . rOPINIRole  0.25 mg Oral Once per day on Sun Tue Thu Sat  . rOPINIRole  0.25 mg Oral 2 times per day on Mon Wed Fri  . sodium chloride flush  3 mL Intravenous Q12H   acetaminophen, clobetasol cream, guaiFENesin-codeine, HYDROcodone-acetaminophen, meclizine, polyethylene glycol  Assessment/ Plan:  60 y.o. female with end-stage renal disease, diabetes, with complications of retinopathy, GERD, hyperlipidemia, glaucoma, legal blindness, history of hysterectomy, right foot surgery, atrial fibrillation, COPD, congestive heart failure, pancreatitis, peripheral neuropathy from diabetes, coronary disease with coronary angioplasty and stents,  1. ESRD. [employer]. T-T-S-1. [facility] - Patient had hemodialysis yesterday. No acute indication for dialysis today. We will plan for dialysis again tomorrow if patient is still here.  2. Shortness of breath Clinically appears significantly improved. No further indication for dialysis today.  3. Anemia of chronic kidney disease Hemoglobin currently 9.6. Continue Epogen with dialysis.  4. Secondary hyperparathyroidism Phosphorus 2.9 and currently acceptable.  5.  Hx of bullous pemphigoid:  On cellcept for this issue.   LOS: 3 Clemence Lengyel 3/17/20173:14 PM

## 2015-10-02 NOTE — Progress Notes (Signed)
Received notification from Dr. Margaretmary Eddy that pt Augmentin is to be given 3x per day instead of daily. New prescription printed out and nurse at [facility] to be made aware.

## 2015-10-02 NOTE — Discharge Summary (Signed)
[place] [facility] [place] NAME: [name]    MR#:  [id number]  DATE OF BIRTH:  01/08/1956  DATE OF ADMISSION:  09/29/2015 ADMITTING PHYSICIAN: Vaughan Basta, MD  DATE OF DISCHARGE: 10/02/2015 PRIMARY CARE PHYSICIAN: Marden Noble, MD    ADMISSION DIAGNOSIS:  Healthcare-associated pneumonia [J18.9] Generalized weakness [R53.1]  DISCHARGE DIAGNOSIS:  Principal Problem:   Pneumonia   SECONDARY DIAGNOSIS:   Past Medical History  Diagnosis Date  . Diabetes ([place])   . COPD (chronic obstructive pulmonary disease) ([place])   . CKD (chronic kidney disease)   . CAD (coronary artery disease)   . HTN (hypertension)   . MI (myocardial infarction) ([place]) 05-17-2014  . CHF (congestive heart failure) ([place])   . Atrial fibrillation ([place])   . GERD (gastroesophageal reflux disease)   . Pancreatitis   . Peripheral autonomic neuropathy due to diabetes mellitus ([place])   . Diabetic peripheral neuropathy associated with type 2 diabetes mellitus ([place])   . Dialysis patient [facility])     HOSPITAL COURSE:   * Acute respiratory distress secondary to Pneumonia and fluid overload Clinically feeling better Clinically improved with broad-spectrum antibiotic as patient is from nursing home and she is on hemodialysis. Discharge patient with oral antibiotics Augmentin to [facility] She is not able to give sputum sample as per her report Blood cultures no growth in 48hours 2  * End-stage renal disease on hemodialysis Received hemodialysis 2 subsequent days Continue hemodialysis on Tuesday, Thursday and Saturday Appreciate nephrology consult.  * Diabetes Continue basal insulin and keep on insulin sliding scale coverage.  * Coronary artery disease and CHF Continue home medications.  * Hypertension Continue home medications. PT consult -no PT follow-up needed  DISCHARGE CONDITIONS:   FAIR  CONSULTS OBTAINED:  Treatment  Team:  Murlean Iba, MD   PROCEDURES HD   DRUG ALLERGIES:   Allergies  Allergen Reactions  . Adhesive [Tape] Other (See Comments)    Reaction:  Blisters   . Shellfish Allergy Other (See Comments)    Reaction:  Unknown   . Doxycycline Rash    DISCHARGE MEDICATIONS:   Current Discharge Medication List    START taking these medications   Details  amoxicillin-clavulanate (AUGMENTIN) 500-125 MG tablet Take 1 tablet (500 mg total) by mouth daily. Qty: 20 tablet, Refills: 0    irbesartan (AVAPRO) 150 MG tablet Take 1 tablet (150 mg total) by mouth daily. Qty: 30 tablet, Refills: 0    mupirocin ointment (BACTROBAN) 2 % Place 1 application into the nose 2 (two) times daily. Qty: 22 g, Refills: 0      CONTINUE these medications which have CHANGED   Details  carvedilol (COREG) 6.25 MG tablet Take 1 tablet (6.25 mg total) by mouth 2 (two) times daily. Qty: 60 tablet, Refills: 0      CONTINUE these medications which have NOT CHANGED   Details  acetaminophen (TYLENOL) 325 MG tablet Take 650 mg by mouth every 6 (six) hours as needed for mild pain.     Albuterol Sulfate (PROAIR RESPICLICK) 123XX123 (90 Base) MCG/ACT AEPB Inhale 2 puffs into the lungs every 4 (four) hours as needed (for wheezing/shortness of breath).    aspirin EC 81 MG tablet Take 81 mg by mouth daily.    atorvastatin (LIPITOR) 20 MG tablet Take 20 mg by mouth every evening.     baclofen (LIORESAL) 10 MG tablet Take 5 mg by mouth at bedtime.    calcium acetate (  PHOSLO) 667 MG capsule Take 1,334-2,001 mg by mouth See admin instructions. Pt takes two capsules with meals once daily on Tuesday, Thursday, and Saturday. Pt takes three capsules twice daily with meals on Sunday, Monday, Wednesday, and Friday.    Calcium Carbonate-Vitamin D (CALCIUM 600+D) 600-400 MG-UNIT tablet Take 1 tablet by mouth daily.    clobetasol cream (TEMOVATE) [id number] % Apply 1 application topically 2 (two) times daily as needed (for itching).     clopidogrel (PLAVIX) 75 MG tablet Take 75 mg by mouth daily.     COLLAGEN MATRIX-SILVER EX Apply 1 application topically as needed (for wound care).    docusate sodium (COLACE) 100 MG capsule Take 100 mg by mouth daily.     famotidine (PEPCID) 20 MG tablet Take 20 mg by mouth every evening.    !! furosemide (LASIX) 40 MG tablet Take 40 mg by mouth every Monday, Wednesday, and Friday.    !! furosemide (LASIX) 80 MG tablet Take 80 mg by mouth every Tuesday, Thursday, Saturday, and Sunday.    guaiFENesin (MUCINEX) 600 MG 12 hr tablet Take 1,200 mg by mouth 2 (two) times daily.    hydrALAZINE (APRESOLINE) 50 MG tablet Take 50 mg by mouth 2 (two) times daily.    HYDROcodone-acetaminophen (NORCO) 5-325 MG per tablet Take 1 tablet by mouth every 6 (six) hours as needed for moderate pain. Qty: 30 tablet, Refills: 0    hydroxypropyl methylcellulose / hypromellose (ISOPTO TEARS / GONIOVISC) 2.5 % ophthalmic solution Place 2 drops into the right eye every 6 (six) hours as needed for dry eyes.    insulin lispro (HUMALOG) 100 UNIT/ML injection Inject 20 Units into the skin 3 (three) times daily before meals.     isosorbide dinitrate (ISORDIL) 30 MG tablet Take 30 mg by mouth daily.     levothyroxine (SYNTHROID, LEVOTHROID) 50 MCG tablet Take 50 mcg by mouth at bedtime.     loratadine (CLARITIN) 10 MG tablet Take 10 mg by mouth every other day.    losartan (COZAAR) 50 MG tablet Take 50 mg by mouth 2 (two) times daily.     magnesium oxide (MAG-OX) 400 MG tablet Take 400 mg by mouth daily.     meclizine (ANTIVERT) 25 MG tablet Take 25 mg by mouth every 6 (six) hours as needed for dizziness or nausea.     metoCLOPramide (REGLAN) 10 MG tablet Take 10 mg by mouth at bedtime.    multivitamin (RENA-VIT) TABS tablet Take 1 tablet by mouth daily.    nitroGLYCERIN (NITROSTAT) 0.4 MG SL tablet Place 0.4 mg under the tongue every 5 (five) minutes as needed for chest pain.    Phenylephrine-DM-GG  (ROBITUSSIN COUGH/COLD CF) 5-10-100 MG/5ML LIQD Take 10 mLs by mouth every 6 (six) hours as needed (for cough).    polyethylene glycol (MIRALAX / GLYCOLAX) packet Take 17 g by mouth daily as needed for mild constipation.    predniSONE (DELTASONE) 10 MG tablet Take 10 mg by mouth daily.    !! pregabalin (LYRICA) 25 MG capsule Take 25 mg by mouth 2 (two) times daily.    !! pregabalin (LYRICA) 50 MG capsule Take 50 mg by mouth at bedtime.    rOPINIRole (REQUIP) 0.25 MG tablet Take 0.25 mg by mouth See admin instructions. Pt takes one tablet once a day on Sunday, Tuesday, Thursday, and Saturday.   Pt takes one tablet twice a day on Monday, Wednesday, and Friday.     !! - Potential duplicate medications found. Please discuss  with provider.    STOP taking these medications     fluticasone (FLONASE) 50 MCG/ACT nasal spray      insulin glargine (LANTUS) 100 UNIT/ML injection      mycophenolate (CELLCEPT) 500 MG tablet          DISCHARGE INSTRUCTIONS:   Activity as tolerated  Diet renal and diabetic Follow-up with primary care physician in a week Follow-up with nephrology as recommended and continue hemodialysis on Tuesday, Thursday and Saturday   DIET:  Renal diet  DISCHARGE CONDITION:  Fair  ACTIVITY:  Activity as tolerated  OXYGEN:  Home Oxygen: No.   Oxygen Delivery: room air  DISCHARGE LOCATION:  [facility]   If you experience worsening of your admission symptoms, develop shortness of breath, life threatening emergency, suicidal or homicidal thoughts you must seek medical attention immediately by calling 911 or calling your MD immediately  if symptoms less severe.  You Must read complete instructions/literature along with all the possible adverse reactions/side effects for all the Medicines you take and that have been prescribed to you. Take any new Medicines after you have completely understood and accpet all the possible adverse reactions/side effects.    Please note  You were cared for by a hospitalist during your hospital stay. If you have any questions about your discharge medications or the care you received while you were in the hospital after you are discharged, you can call the unit and asked to speak with the hospitalist on call if the hospitalist that took care of you is not available. Once you are discharged, your primary care physician will handle any further medical issues. Please note that NO REFILLS for any discharge medications will be authorized once you are discharged, as it is imperative that you return to your primary care physician (or establish a relationship with a primary care physician if you do not have one) for your aftercare needs so that they can reassess your need for medications and monitor your lab values.     Today  Chief Complaint  Patient presents with  . Weakness   Patient is feeling much better. Shortness of breath is significantly improved. Denies any complaints today. Wants to be discharged  ROS:  CONSTITUTIONAL: Denies fevers, chills. Denies any fatigue, weakness.  EYES: Denies blurry vision, double vision, eye pain. EARS, NOSE, THROAT: Denies tinnitus, ear pain, hearing loss. RESPIRATORY: Denies cough, wheeze, shortness of breath.  CARDIOVASCULAR: Denies chest pain, palpitations, edema.  GASTROINTESTINAL: Denies nausea, vomiting, diarrhea, abdominal pain. Denies bright red blood per rectum. GENITOURINARY: Denies dysuria, hematuria. ENDOCRINE: Denies nocturia or thyroid problems. HEMATOLOGIC AND LYMPHATIC: Denies easy bruising or bleeding. SKIN: Denies rash or lesion. MUSCULOSKELETAL: Denies pain in neck, back, shoulder, knees, hips or arthritic symptoms.  NEUROLOGIC: Denies paralysis, paresthesias.  PSYCHIATRIC: Denies anxiety or depressive symptoms.   VITAL SIGNS:  Blood pressure 168/63, pulse 77, temperature 98 F (36.7 C), temperature source Oral, resp. rate 18, height 5\' 1"  (1.549 m),  weight 80 kg (176 lb 5.9 oz), SpO2 94 %.  I/O:    Intake/Output Summary (Last 24 hours) at 10/02/15 1427 Last data filed at 10/02/15 0512  Gross per 24 hour  Intake      0 ml  Output   3150 ml  Net  -3150 ml    PHYSICAL EXAMINATION:  GENERAL:  60 y.o.-year-old patient lying in the bed with no acute distress.  EYES: Pupils equal, round, reactive to light and accommodation. No scleral icterus. Extraocular muscles intact.  HEENT:  Head atraumatic, normocephalic. Oropharynx and nasopharynx clear.  NECK:  Supple, no jugular venous distention. No thyroid enlargement, no tenderness.  LUNGS: Normal breath sounds bilaterally, no wheezing, rales,rhonchi or crepitation. No use of accessory muscles of respiration.  CARDIOVASCULAR: S1, S2 normal. No murmurs, rubs, or gallops.  ABDOMEN: Soft, non-tender, non-distended. Bowel sounds present. No organomegaly or mass.  EXTREMITIES: No pedal edema, cyanosis, or clubbing.  NEUROLOGIC: Cranial nerves II through XII are intact. Muscle strength 5/5 in all extremities. Sensation intact. Gait not checked.  PSYCHIATRIC: The patient is alert and oriented x 3.  SKIN: No obvious rash, lesion, or ulcer.   DATA REVIEW:   CBC  Recent Labs Lab 09/30/15 0548  WBC 11.3*  HGB 9.6*  HCT 29.0*  PLT 183    Chemistries   Recent Labs Lab 09/29/15 1208  09/30/15 0548  NA 134*  --  134*  K 3.2*  --  3.7  CL 93*  --  97*  CO2 31  --  27  GLUCOSE 199*  --  232*  BUN 20  --  36*  CREATININE 2.94*  < > 4.21*  CALCIUM 8.2*  --  7.9*  AST 25  --   --   ALT 25  --   --   ALKPHOS 82  --   --   BILITOT 0.9  --   --   < > = values in this interval not displayed.  Cardiac Enzymes  Recent Labs Lab 09/30/15 0548  TROPONINI 0.23*    Microbiology Results  Results for orders placed or performed during the hospital encounter of 09/29/15  Blood culture (routine x 2)     Status: None (Preliminary result)   Collection Time: 09/29/15  1:35 PM  Result Value  Ref Range Status   Specimen Description BLOOD RIGHT ASSIST CONTROL  Final   Special Requests BOTTLES DRAWN AEROBIC AND ANAEROBIC 2CCAERO,4CCANA  Final   Culture NO GROWTH 2 DAYS  Final   Report Status PENDING  Incomplete  Blood culture (routine x 2)     Status: None (Preliminary result)   Collection Time: 09/29/15  1:59 PM  Result Value Ref Range Status   Specimen Description BLOOD RIGHT ASSIST CONTROL  Final   Special Requests BOTTLES DRAWN AEROBIC AND ANAEROBIC 5CCAERO,4CCANA  Final   Culture NO GROWTH 2 DAYS  Final   Report Status PENDING  Incomplete  MRSA PCR Screening     Status: Abnormal   Collection Time: 09/30/15  5:55 AM  Result Value Ref Range Status   MRSA by PCR POSITIVE (A) NEGATIVE Final    Comment:        The GeneXpert MRSA Assay (FDA approved for NASAL specimens only), is one component of a comprehensive MRSA colonization surveillance program. It is not intended to diagnose MRSA infection nor to guide or monitor treatment for MRSA infections. CRITICAL RESULT CALLED TO, READ BACK BY AND VERIFIED WITH: ANITA HARRIS AT 0807 ON 09/30/15.Marland KitchenMarland KitchenGuadalupe Regional Medical Center     RADIOLOGY:  Dg Chest 2 View  09/29/2015  CLINICAL DATA:  Felt weak after dialysis, cough, drowsy, history diabetes mellitus, hypertension, end-stage renal disease, coronary artery disease post MI and coronary PTCA, atrial fibrillation, CHF EXAM: CHEST  2 VIEW COMPARISON:  09/13/2014 FINDINGS: Enlargement of cardiac silhouette with pulmonary vascular congestion. Atherosclerotic calcification aorta. Increased interstitial markings since previous exam question pulmonary edema. LEFT basilar atelectasis. RIGHT basilar atelectasis versus infiltrate. No pleural effusion or pneumothorax. Bones demineralized. IMPRESSION: Enlargement of cardiac silhouette with pulmonary vascular congestion  and suspect pulmonary edema. LEFT basilar atelectasis with atelectasis versus consolidation RIGHT lower lobe. Electronically Signed   By: Lavonia Dana  M.D.   On: 09/29/2015 12:55    EKG:   Orders placed or performed during the hospital encounter of 09/29/15  . EKG 12-Lead  . EKG 12-Lead      Management plans discussed with the patient, family and they are in agreement.  CODE STATUS:     Code Status Orders        Start     Ordered   09/29/15 1602  Full code   Continuous     09/29/15 1601    Code Status History    Date Active Date Inactive Code Status Order ID Comments User Context   05/20/2014  1:14 PM 05/22/2014  6:54 PM Full Code [id number]  Charolette Forward, MD Inpatient    Advance Directive Documentation        Most Recent Value   Type of Advance Directive  Living will   Pre-existing out of facility DNR order (yellow form or pink MOST form)     "MOST" Form in Place?        TOTAL TIME TAKING CARE OF THIS PATIENT: 45  minutes.    @[facility] @  on 10/02/2015 at 2:27 PM  Between 7am to 6pm - Pager - [phone]  After 6pm go to [email or web address] - password [email or web address]  Office  [phone]  CC: Primary care physician; Marden Noble, MD

## 2015-10-02 NOTE — Clinical Social Work Note (Signed)
Patient to discharge today to return to [facility] Patient is in agreement and wishes to go via EMS. Discharge information has been sent to [facility] and they are aware. Shela Leff MSW,LCSW [phone]

## 2015-10-02 NOTE — Care Management Important Message (Signed)
Important Message  Patient Details  Name: [name] MRN: [id number] Date of Birth: 17-Aug-1955   Medicare Important Message Given:  Yes    Beverly Sessions, RN 10/02/2015, 10:49 AM

## 2015-10-04 LAB — CULTURE, BLOOD (ROUTINE X 2)
CULTURE: NO GROWTH
Culture: NO GROWTH

## 2015-10-05 ENCOUNTER — Encounter: Payer: Medicare Other | Admitting: Surgery

## 2015-10-05 DIAGNOSIS — I2510 Atherosclerotic heart disease of native coronary artery without angina pectoris: Secondary | ICD-10-CM | POA: Diagnosis not present

## 2015-10-05 DIAGNOSIS — E1122 Type 2 diabetes mellitus with diabetic chronic kidney disease: Secondary | ICD-10-CM | POA: Diagnosis not present

## 2015-10-05 DIAGNOSIS — X58XXXA Exposure to other specified factors, initial encounter: Secondary | ICD-10-CM | POA: Diagnosis not present

## 2015-10-05 DIAGNOSIS — E11622 Type 2 diabetes mellitus with other skin ulcer: Secondary | ICD-10-CM | POA: Diagnosis not present

## 2015-10-05 DIAGNOSIS — I120 Hypertensive chronic kidney disease with stage 5 chronic kidney disease or end stage renal disease: Secondary | ICD-10-CM | POA: Diagnosis not present

## 2015-10-05 DIAGNOSIS — E785 Hyperlipidemia, unspecified: Secondary | ICD-10-CM | POA: Diagnosis not present

## 2015-10-05 DIAGNOSIS — S80821A Blister (nonthermal), right lower leg, initial encounter: Secondary | ICD-10-CM | POA: Diagnosis not present

## 2015-10-05 DIAGNOSIS — D649 Anemia, unspecified: Secondary | ICD-10-CM | POA: Diagnosis not present

## 2015-10-05 DIAGNOSIS — Z992 Dependence on renal dialysis: Secondary | ICD-10-CM | POA: Diagnosis not present

## 2015-10-05 DIAGNOSIS — N186 End stage renal disease: Secondary | ICD-10-CM | POA: Diagnosis not present

## 2015-10-05 DIAGNOSIS — L89112 Pressure ulcer of right upper back, stage 2: Secondary | ICD-10-CM | POA: Diagnosis not present

## 2015-10-05 NOTE — Progress Notes (Addendum)
[name] ([id number]) Visit Report for 10/05/2015 Chief Complaint Document Details [name] 10/05/2015 10:45 Date of Service: AM Medical Record [id number] Number: Patient Account Number: [id number] 08/24/1955 (60 y.o. Treating RN: Montey Hora Date of Birth/Sex: Female) Other Clinician: Primary Care Physician: [name] Treating Yannet Rincon Referring Physician: [name] Physician/Extender: [name]: 14 Information Obtained from: Patient Chief Complaint Patient presents to the wound care center for a consult due non healing wound. She has an open wound on her right upper back which she's had for about a year and she recently noticed a blister on her right lower extremity about 2 weeks ago. Electronic Signature(s) Signed: 10/05/2015 11:07:54 AM By: [name], [name] Entered By: [name] on 10/05/2015 11:07:54 [name] ([id number]) -------------------------------------------------------------------------------- HPI Details [name] 10/05/2015 10:45 Date of Service: AM Medical Record [id number] Number: Patient Account Number: [id number] 05/28/1956 (60 y.o. Treating RN: Montey Hora Date of Birth/Sex: Female) Other Clinician: Primary Care Physician: [name] Treating Momin Misko Referring Physician: [name] Physician/Extender: [name]: 40 History of Present Illness Location: right upper back and right lower extremity wounds Quality: Patient reports No Pain. Severity: Patient states wound (s) are getting better. Duration: Patient has had the wound for > 3 months prior to seeking treatment at the wound center Timing: she thought it first occurred when she was using a heating pad about a year ago after back surgery. Context: The wound appeared gradually over time Modifying Factors: Patient is currently on renal dialysis and receives treatments 3 times weekly Associated  Signs and Symptoms: Patient reports having: surgery scheduled for this week for a AV fistula left arm. HPI Description: 60 year old patient who is known to be a diabetic and has end-stage renal disease has had several comorbidities including coronary artery disease, hypertension, hyperlipidemia, pancreatitis, anemia, previous history of hysterectomy, cholecystectomy, left-sided salivary gland excision, bilateral cataract surgery,Peritoneal dialysis catheter, hemodialysis catheter. the area on the back has also been caused by instant pressure she used to sleep on a recliner all day and has significant kyphoscoliosis. As far as the wound on her right lower extremity she's not sure how this blister occurred but she thought it has been there for about 2 weeks. No recent blood investigations available and no recent hemoglobin A1c. 12/30/2014 -- she is an assisted living facility but I believe the nurses that have not followed instructions as she had some cream applied on her back and there was a different dressing. Last week she's had a AV fistula placed on her left forearm. 01/13/2015 -- she has had some localized infection at the port site and she's been on doxycycline for this. 02/05/2015 - he has developed a small blister on her right lower extremity. 02/10/2015 -- she has developed another small blister on her right anterior chest wall in the area where she's had tape for her dialysis access. this may just be injury caused by a tape burn. 02/18/2015 -- no new blisters and she had a dermatology opinion and they have taken a biopsy of her skin. She also had a left brachial AV fistula placed this week. 03/03/2015 -- though we do not have the pathology report yet the patient says she has been put on prednisone because this skin disease is possibly to do with her immune system and her dermatologist is recommended this. 03/10/2015 -- she has developed a new blister which is quite large on her right  lower extremity on the shin.  05/22/2015 -- she did very well with her dressing but on trying to remove the [place] it peels away [name], [name] ([id number]) the newly healed skin. 06/05/2015 -- the patient was in the ER this past week for severe bleeding from the right nostril and had to have ENT see her and do a packing. They've also been holding her heparin during her dialysis. The patient is going back to ENT today. Other than that she has had no other significant issues. 06/19/2015 -- she is back on her blood thinners and continues to have her hemodialysis as before. 07/31/2015 -- a large blister has popped up again on her right lower extremity in the same place in the mid shin area in the anterior lateral compartment. 10/05/2015 -- last week she was admitted to the hospital for 4 days as she had a pneumonia and was treated with IV and oral antibiotics. Electronic Signature(s) Signed: 10/05/2015 11:08:19 AM By: [name], [name] Entered By: [name] on 10/05/2015 11:08:19 [name] ([id number]) -------------------------------------------------------------------------------- Physical Exam Details [name] 10/05/2015 10:45 Date of Service: AM Medical Record [id number] Number: Patient Account Number: [id number] Sep 14, 1955 (60 y.o. Treating RN: Montey Hora Date of Birth/Sex: Female) Other Clinician: Primary Care Physician: [name] Treating Gerardo Territo Referring Physician: [name] Physician/Extender: [name]: 40 Constitutional . Pulse regular. Respirations normal and unlabored. Afebrile. . Eyes Nonicteric. Reactive to light. Ears, Nose, Mouth, and Throat Lips, teeth, and gums WNL.Marland Kitchen Moist mucosa without lesions. Neck supple and nontender. No palpable supraclavicular or cervical adenopathy. Normal sized without goiter. Respiratory WNL. No retractions.. Cardiovascular Pedal Pulses WNL. No clubbing, cyanosis or  edema. Lymphatic No adneopathy. No adenopathy. No adenopathy. Musculoskeletal Adexa without tenderness or enlargement.. Digits and nails w/o clubbing, cyanosis, infection, petechiae, ischemia, or inflammatory conditions.. Integumentary (Hair, Skin) No suspicious lesions. No crepitus or fluctuance. No peri-wound warmth or erythema. No masses.Marland Kitchen Psychiatric Judgement and insight Intact.. No evidence of depression, anxiety, or agitation.. Notes the wound on her back looks excellent with significant amount of epithelialization and no evidence of cellulitis or drainage. We will continue local care with Siltec Sorbact foam. Electronic Signature(s) Signed: 10/05/2015 11:09:07 AM By: [name], [name] Entered By: [name] on 10/05/2015 11:09:06 Diven, [name] ([id number]) -------------------------------------------------------------------------------- Physician Orders Details [name] 10/05/2015 10:45 Date of Service: AM Medical Record [id number] Number: Patient Account Number: [id number] [birth year] (60 y.o. Treating RN: Montey Hora Date of Birth/Sex: Female) Other Clinician: Primary Care Physician: [name] Treating Meri Pelot Referring Physician: [name] Physician/Extender: [name]: 63 Verbal / Phone Orders: Yes Clinician: Montey Hora Read Back and Verified: Yes Diagnosis Coding Wound Cleansing Wound #1 Right Back o Clean wound with Normal Saline. Anesthetic Wound #1 Right Back o Topical Lidocaine 4% cream applied to wound bed prior to debridement Skin Barriers/Peri-Wound Care Wound #1 Right Back o Skin Prep Primary Wound Dressing Wound #1 Right Back o Other: - Siltec Sorbact - SNF RN may change dressing if needed as drainage pass white border. Extra piece given to patient Dressing Change Frequency Wound #1 Right Back o Change dressing every week - unless saturated. Gently pull of old dressing due to  patient thin epetheilized skin and may wet with saline if needed Follow-up Appointments Wound #1 Right Back o Return Appointment in 1 week. Electronic Signature(s) Signed: 10/05/2015 4:23:25 PM By: [name], [name] Signed: 10/05/2015 5:01:01 PM By: Montey Hora Entered By: Montey Hora on 10/05/2015 11:08:41  LAIBA, GOLZ ([id number]) -------------------------------------------------------------------------------- Problem List Details [name] 10/05/2015 10:45 Date of Service: AM Medical Record [id number] Number: Patient Account Number: [id number] [birth year] (60 y.o. Treating RN: Montey Hora Date of Birth/Sex: Female) Other Clinician: Primary Care Physician: [name] Treating Xane Amsden Referring Physician: [name] Physician/Extender: [name]: 67 Active Problems ICD-10 Encounter Code Description Active Date Diagnosis E11.622 Type 2 diabetes mellitus with other skin ulcer 12/23/2014 Yes L89.112 Pressure ulcer of right upper back, stage 2 12/23/2014 Yes S80.821A Blister (nonthermal), right lower leg, initial encounter 12/23/2014 Yes N18.6 End stage renal disease 12/23/2014 Yes Inactive Problems Resolved Problems Electronic Signature(s) Signed: 10/05/2015 11:07:46 AM By: [name], [name] Entered By: [name] on 10/05/2015 11:07:46 Strassner, [name] ([id number]) -------------------------------------------------------------------------------- Progress Note Details Patient Name: [name] Cornwall. 10/05/2015 10:45 Date of Service: AM Medical Record [id number] Number: Patient Account Number: [id number] 01-Jan-1956 (60 y.o. Treating RN: Montey Hora Date of Birth/Sex: Female) Other Clinician: Primary Care Physician: [name] Treating Ninel Abdella Referring Physician: [name] Physician/Extender: [name]: 65 Subjective Chief Complaint Information obtained from Patient Patient presents to the  wound care center for a consult due non healing wound. She has an open wound on her right upper back which she's had for about a year and she recently noticed a blister on her right lower extremity about 2 weeks ago. History of Present Illness (HPI) The following HPI elements were documented for the patient's wound: Location: right upper back and right lower extremity wounds Quality: Patient reports No Pain. Severity: Patient states wound (s) are getting better. Duration: Patient has had the wound for > 3 months prior to seeking treatment at the wound center Timing: she thought it first occurred when she was using a heating pad about a year ago after back surgery. Context: The wound appeared gradually over time Modifying Factors: Patient is currently on renal dialysis and receives treatments 3 times weekly Associated Signs and Symptoms: Patient reports having: surgery scheduled for this week for a AV fistula left arm. 60 year old patient who is known to be a diabetic and has end-stage renal disease has had several comorbidities including coronary artery disease, hypertension, hyperlipidemia, pancreatitis, anemia, previous history of hysterectomy, cholecystectomy, left-sided salivary gland excision, bilateral cataract surgery,Peritoneal dialysis catheter, hemodialysis catheter. the area on the back has also been caused by instant pressure she used to sleep on a recliner all day and has significant kyphoscoliosis. As far as the wound on her right lower extremity she's not sure how this blister occurred but she thought it has been there for about 2 weeks. No recent blood investigations available and no recent hemoglobin A1c. 12/30/2014 -- she is an assisted living facility but I believe the nurses that have not followed instructions as she had some cream applied on her back and there was a different dressing. Last week she's had a AV fistula placed on her left forearm. 01/13/2015 -- she has  had some localized infection at the port site and she's been on doxycycline for this. 02/05/2015 - he has developed a small blister on her right lower extremity. 02/10/2015 -- she has developed another small blister on her right anterior chest wall in the area where she's had tape for her dialysis access. this may just be injury caused by a tape burn. [name] ([id number]) 02/18/2015 -- no new blisters and she had a dermatology opinion and they have taken a biopsy of her skin. She also had a left brachial  AV fistula placed this week. 03/03/2015 -- though we do not have the pathology report yet the patient says she has been put on prednisone because this skin disease is possibly to do with her immune system and her dermatologist is recommended this. 03/10/2015 -- she has developed a new blister which is quite large on her right lower extremity on the shin. 05/22/2015 -- she did very well with her dressing but on trying to remove the [place] it peels away the newly healed skin. 06/05/2015 -- the patient was in the ER this past week for severe bleeding from the right nostril and had to have ENT see her and do a packing. They've also been holding her heparin during her dialysis. The patient is going back to ENT today. Other than that she has had no other significant issues. 06/19/2015 -- she is back on her blood thinners and continues to have her hemodialysis as before. 07/31/2015 -- a large blister has popped up again on her right lower extremity in the same place in the mid shin area in the anterior lateral compartment. 10/05/2015 -- last week she was admitted to the hospital for 4 days as she had a pneumonia and was treated with IV and oral antibiotics. Objective Constitutional Pulse regular. Respirations normal and unlabored. Afebrile. Vitals Time Taken: 10:49 AM, Height: 65 in, Weight: 156 lbs, BMI: 26, Temperature: 98.2 F, Pulse: 85 bpm, Respiratory Rate: 18 breaths/min,  Blood Pressure: 164/66 mmHg. Eyes Nonicteric. Reactive to light. Ears, Nose, Mouth, and Throat Lips, teeth, and gums WNL.Marland Kitchen Moist mucosa without lesions. Neck supple and nontender. No palpable supraclavicular or cervical adenopathy. Normal sized without goiter. Respiratory WNL. No retractions.. Cardiovascular Pedal Pulses WNL. No clubbing, cyanosis or edema. [name] ([id number]) Lymphatic No adneopathy. No adenopathy. No adenopathy. Musculoskeletal Adexa without tenderness or enlargement.. Digits and nails w/o clubbing, cyanosis, infection, petechiae, ischemia, or inflammatory conditions.Marland Kitchen Psychiatric Judgement and insight Intact.. No evidence of depression, anxiety, or agitation.. General Notes: the wound on her back looks excellent with significant amount of epithelialization and no evidence of cellulitis or drainage. We will continue local care with Siltec Sorbact foam. Integumentary (Hair, Skin) No suspicious lesions. No crepitus or fluctuance. No peri-wound warmth or erythema. No masses.. Wound #1 status is Open. Original cause of wound was Blister. The wound is located on the Right Back. The wound measures 2.2cm length x 4.4cm width x 0.1cm depth; 7.603cm^2 area and 0.76cm^3 volume. The wound is limited to skin breakdown. There is no tunneling or undermining noted. There is a medium amount of sanguinous drainage noted. The wound margin is indistinct and nonvisible. There is large (67- 100%) red granulation within the wound bed. There is no necrotic tissue within the wound bed. The periwound skin appearance exhibited: Moist. The periwound skin appearance did not exhibit: Callus, Crepitus, Excoriation, Fluctuance, Friable, Induration, Localized Edema, Rash, Scarring, Dry/Scaly, Maceration, Atrophie Blanche, Cyanosis, Ecchymosis, Hemosiderin Staining, Mottled, Pallor, Rubor, Erythema. Periwound temperature was noted as No Abnormality. Assessment Active  Problems ICD-10 E11.622 - Type 2 diabetes mellitus with other skin ulcer L89.112 - Pressure ulcer of right upper back, stage 2 S80.821A - Blister (nonthermal), right lower leg, initial encounter N18.6 - End stage renal disease Plan Wound Cleansing: Wound #1 Right Back: Clean wound with Normal Saline. [name] Cornwall ([id number]) Anesthetic: Wound #1 Right Back: Topical Lidocaine 4% cream applied to wound bed prior to debridement Skin Barriers/Peri-Wound Care: Wound #1 Right Back: Skin Prep Primary Wound Dressing: Wound #1 Right Back:  Other: - Siltec Sorbact - SNF RN may change dressing if needed as drainage pass white border. Extra piece given to patient Dressing Change Frequency: Wound #1 Right Back: Change dressing every week - unless saturated. Gently pull of old dressing due to patient thin epetheilized skin and may wet with saline if needed Follow-up Appointments: Wound #1 Right Back: Return Appointment in 1 week. The wound looks excellent today and she has got some epithelialization and though the skin is fragile we will protect it with Siltec Sorbact foam. details of the dressing have been discussed with her especially for her home care nurses if they do have to change the dressing. Electronic Signature(s) Signed: 10/05/2015 11:09:36 AM By: [name], [name] Entered By: [name] on 10/05/2015 11:09:36 [name] ([id number]) -------------------------------------------------------------------------------- SuperBill Details Patient Name: [name] Cornwall. Date of Service: 10/05/2015 Medical Record Number: [id number] Patient Account Number: [id number] Date of Birth/Sex: Sep 22, 1955 (60 y.o. Female) Treating RN: Montey Hora Primary Care Physician: [name] Other Clinician: Referring Physician: [name] Treating Physician/Extender: Frann Rider in Treatment: 40 Diagnosis Coding ICD-10 Codes Code Description E11.622 Type 2 diabetes mellitus  with other skin ulcer L89.112 Pressure ulcer of right upper back, stage 2 S80.821A Blister (nonthermal), right lower leg, initial encounter N18.6 End stage renal disease Physician Procedures CPT4 Code: [id number] Description: [id number] - WC PHYS LEVEL 3 - EST PT ICD-10 Description Diagnosis E11.622 Type 2 diabetes mellitus with other skin ulcer L89.112 Pressure ulcer of right upper back, stage 2 N18.6 End stage renal disease Modifier: Quantity: 1 Electronic Signature(s) Signed: 10/05/2015 11:09:52 AM By: [name], [name] Entered By: [name] on 10/05/2015 11:09:52

## 2015-10-06 NOTE — Progress Notes (Signed)
SHERIECE, STASIK ([id number]) Visit Report for 10/05/2015 Arrival Information Details [name], [name]. Date of Service: 10/05/2015 10:45 AM Medical Record Number: [id number] Patient Account Number: [id number] Date of Birth/Sex: June 04, 1956 (60 y.o. Female) Treating RN: Montey Hora Primary Care Physician: [name] Other Clinician: Referring Physician: [name] Treating Physician/Extender: Frann Rider in Treatment: 22 Visit Information History Since Last Visit Added or deleted any medications: No Patient Arrived: Walker Any new allergies or adverse reactions: No Arrival Time: 10:47 Had a fall or experienced change in No Accompanied By: self activities of daily living that may affect Transfer Assistance: None risk of falls: Patient Identification Verified: Yes Signs or symptoms of abuse/neglect since last No Secondary Verification Process Yes visito Completed: Hospitalized since last visit: No Patient Requires Transmission- No Pain Present Now: No Based Precautions: Patient Has Alerts: Yes Patient Alerts: Patient on Blood Thinner Electronic Signature(s) Signed: 10/05/2015 5:01:01 PM By: Montey Hora Entered By: Montey Hora on 10/05/2015 10:48:56 [name], [name] ([id number]) -------------------------------------------------------------------------------- Clinic Level of Care Assessment Details [name]. Date of Service: 10/05/2015 10:45 AM Medical Record Number: [id number] Patient Account Number: [id number] Date of Birth/Sex: Jun 13, 1956 (60 y.o. Female) Treating RN: Montey Hora Primary Care Physician: [name] Other Clinician: Referring Physician: [name] Treating Physician/Extender: Frann Rider in Treatment: 40 Clinic Level of Care Assessment Items TOOL 4 Quantity Score []  - Use when only an EandM is performed on FOLLOW-UP visit 0 ASSESSMENTS - Nursing Assessment / Reassessment X - Reassessment of  Co-morbidities (includes updates in patient status) 1 10 X - Reassessment of Adherence to Treatment Plan 1 5 ASSESSMENTS - Wound and Skin Assessment / Reassessment X - Simple Wound Assessment / Reassessment - one wound 1 5 []  - Complex Wound Assessment / Reassessment - multiple wounds 0 []  - Dermatologic / Skin Assessment (not related to wound area) 0 ASSESSMENTS - Focused Assessment []  - Circumferential Edema Measurements - multi extremities 0 []  - Nutritional Assessment / Counseling / Intervention 0 []  - Lower Extremity Assessment (monofilament, tuning fork, pulses) 0 []  - Peripheral Arterial Disease Assessment (using hand held doppler) 0 ASSESSMENTS - Ostomy and/or Continence Assessment and Care []  - Incontinence Assessment and Management 0 []  - Ostomy Care Assessment and Management (repouching, etc.) 0 PROCESS - Coordination of Care X - Simple Patient / Family Education for ongoing care 1 15 []  - Complex (extensive) Patient / Family Education for ongoing care 0 []  - Staff obtains Programmer, systems, Records, Test Results / Process Orders 0 []  - Staff telephones HHA, Nursing Homes / Clarify orders / etc 0 []  - Routine Transfer to another Facility (non-emergent condition) 0 [name], [name] ([id number]) []  - Routine Hospital Admission (non-emergent condition) 0 []  - New Admissions / Biomedical engineer / Ordering NPWT, Apligraf, etc. 0 []  - Emergency Hospital Admission (emergent condition) 0 X - Simple Discharge Coordination 1 10 []  - Complex (extensive) Discharge Coordination 0 PROCESS - Special Needs []  - Pediatric / Minor Patient Management 0 []  - Isolation Patient Management 0 []  - Hearing / Language / Visual special needs 0 []  - Assessment of Community assistance (transportation, D/C planning, etc.) 0 []  - Additional assistance / Altered mentation 0 []  - Support Surface(s) Assessment (bed, cushion, seat, etc.) 0 INTERVENTIONS - Wound Cleansing / Measurement X - Simple Wound  Cleansing - one wound 1 5 []  - Complex Wound Cleansing - multiple wounds 0 X - Wound Imaging (photographs - any number of wounds) 1 5 []  - Wound  Tracing (instead of photographs) 0 X - Simple Wound Measurement - one wound 1 5 []  - Complex Wound Measurement - multiple wounds 0 INTERVENTIONS - Wound Dressings X - Small Wound Dressing one or multiple wounds 1 10 []  - Medium Wound Dressing one or multiple wounds 0 []  - Large Wound Dressing one or multiple wounds 0 []  - Application of Medications - topical 0 []  - Application of Medications - injection 0 INTERVENTIONS - Miscellaneous []  - External ear exam 0 [name], [name] ([id number]) []  - Specimen Collection (cultures, biopsies, blood, body fluids, etc.) 0 []  - Specimen(s) / Culture(s) sent or taken to Lab for analysis 0 []  - Patient Transfer (multiple staff / Harrel Lemon Lift / Similar devices) 0 []  - Simple Staple / Suture removal (25 or less) 0 []  - Complex Staple / Suture removal (26 or more) 0 []  - Hypo / Hyperglycemic Management (close monitor of Blood Glucose) 0 []  - Ankle / Brachial Index (ABI) - do not check if billed separately 0 X - Vital Signs 1 5 Has the patient been seen at the hospital within the last three years: Yes Total Score: 75 Level Of Care: New/Established - Level 2 Electronic Signature(s) Signed: 10/05/2015 5:01:01 PM By: Montey Hora Entered By: Montey Hora on 10/05/2015 11:12:55 [name], [name] ([id number]) -------------------------------------------------------------------------------- Encounter Discharge Information Details [name], Nevia S. Date of Service: 10/05/2015 10:45 AM Medical Record Number: [id number] Patient Account Number: [id number] Date of Birth/Sex: May 06, 1956 (60 y.o. Female) Treating RN: Montey Hora Primary Care Physician: [name] Other Clinician: Referring Physician: [name] Treating Physician/Extender: Frann Rider in Treatment: 77 Encounter Discharge  Information Items Discharge Pain Level: 0 Discharge Condition: Stable Ambulatory Status: Walker Discharge Destination: Home Transportation: Private Auto Accompanied By: self Schedule Follow-up Appointment: Yes Medication Reconciliation completed No and provided to Patient/Care Lakeyta Vandenheuvel: Provided on Clinical Summary of Care: 10/05/2015 Form Type Recipient Paper Patient LD Electronic Signature(s) Signed: 10/05/2015 11:19:10 AM By: Ruthine Dose Entered By: Ruthine Dose on 10/05/2015 11:19:09 Manger, [name] ([id number]) -------------------------------------------------------------------------------- Multi Wound Chart Details [name]. Date of Service: 10/05/2015 10:45 AM Medical Record Number: [id number] Patient Account Number: [id number] Date of Birth/Sex: 03-07-1956 (60 y.o. Female) Treating RN: Montey Hora Primary Care Physician: [name] Other Clinician: Referring Physician: [name] Treating Physician/Extender: Frann Rider in Treatment: 40 Vital Signs Height(in): 65 Pulse(bpm): 85 Weight(lbs): 156 Blood Pressure 164/66 (mmHg): Body Mass Index(BMI): 26 Temperature(F): 98.2 Respiratory Rate 18 (breaths/min): Photos: [1:No Photos] [N/A:N/A] Wound Location: [1:Right Back] [N/A:N/A] Wounding Event: [1:Blister] [N/A:N/A] Primary Etiology: [1:Pressure Ulcer] [N/A:N/A] Comorbid History: [1:Glaucoma, Anemia, Coronary Artery Disease, Hypertension, Type II Diabetes, End Stage Renal Disease, Osteoarthritis, Neuropathy] [N/A:N/A] Date Acquired: [1:10/19/2013] [N/A:N/A] Weeks of Treatment: [1:40] [N/A:N/A] Wound Status: [1:Open] [N/A:N/A] Measurements L x W x D 2.2x4.4x0.1 [N/A:N/A] (cm) Area (cm) : [1:7.603] [N/A:N/A] Volume (cm) : [1:0.76] [N/A:N/A] % Reduction in Area: [1:80.20%] [N/A:N/A] % Reduction in Volume: 80.20% [N/A:N/A] Classification: [1:Category/Stage II] [N/A:N/A] Exudate Amount: [1:Medium] [N/A:N/A] Exudate Type:  [1:Sanguinous] [N/A:N/A] Exudate Color: [1:red] [N/A:N/A] Wound Margin: [1:Indistinct, nonvisible] [N/A:N/A] Granulation Amount: [1:Large (67-100%)] [N/A:N/A] Granulation Quality: [1:Red, Hyper-granulation] [N/A:N/A] Necrotic Amount: [1:None Present (0%)] [N/A:N/A] Exposed Structures: [1:Fascia: No Fat: No Tendon: No] [N/A:N/A] Muscle: No Joint: No Bone: No Limited to Skin Breakdown Epithelialization: Medium (34-66%) N/A N/A Periwound Skin Texture: Edema: No N/A N/A Excoriation: No Induration: No Callus: No Crepitus: No Fluctuance: No Friable: No Rash: No Scarring: No Periwound Skin Moist: Yes N/A N/A Moisture: Maceration: No Dry/Scaly: No  Periwound Skin Color: Atrophie Blanche: No N/A N/A Cyanosis: No Ecchymosis: No Erythema: No Hemosiderin Staining: No Mottled: No Pallor: No Rubor: No Temperature: No Abnormality N/A N/A Tenderness on No N/A N/A Palpation: Wound Preparation: Ulcer Cleansing: N/A N/A Rinsed/Irrigated with Saline Topical Anesthetic Applied: None Treatment Notes Electronic Signature(s) Signed: 10/05/2015 5:01:01 PM By: Montey Hora Entered By: Montey Hora on 10/05/2015 10:56:18 [name], [name] ([id number]) -------------------------------------------------------------------------------- [place] Details [name]. Date of Service: 10/05/2015 10:45 AM Medical Record Number: [id number] Patient Account Number: [id number] Date of Birth/Sex: 1956-03-04 (60 y.o. Female) Treating RN: Montey Hora Primary Care Physician: [name] Other Clinician: Referring Physician: [name] Treating Physician/Extender: Frann Rider in Treatment: 53 Active Inactive Abuse / Safety / Falls / Self Care Management Nursing Diagnoses: Potential for falls Goals: Patient will remain injury free Date Initiated: 12/23/2014 Goal Status: Active Patient/caregiver will verbalize understanding of skin care  regimen Date Initiated: 12/23/2014 Goal Status: Active Patient/caregiver will verbalize/demonstrate measures taken to prevent injury and/or falls Date Initiated: 12/23/2014 Goal Status: Active Patient/caregiver will verbalize/demonstrate understanding of what to do in case of emergency Date Initiated: 12/23/2014 Goal Status: Active Interventions: Assess fall risk on admission and as needed Assess: immobility, friction, shearing, incontinence upon admission and as needed Assess impairment of mobility on admission and as needed per policy Assess self care needs on admission and as needed Provide education on fall prevention Notes: Wound/Skin Impairment Nursing Diagnoses: Impaired tissue integrity Knowledge deficit related to ulceration/compromised skin integrity Goals: Patient/caregiver will verbalize understanding of skin care regimen [name], [name] ([id number]) Date Initiated: 12/23/2014 Goal Status: Active Ulcer/skin breakdown will heal within 14 weeks Date Initiated: 12/23/2014 Goal Status: Active Interventions: Assess patient/caregiver ability to obtain necessary supplies Assess patient/caregiver ability to perform ulcer/skin care regimen upon admission and as needed Assess ulceration(s) every visit Provide education on ulcer and skin care Treatment Activities: Skin care regimen initiated : 10/05/2015 Topical wound management initiated : 10/05/2015 Notes: Electronic Signature(s) Signed: 10/05/2015 5:01:01 PM By: Montey Hora Entered By: Montey Hora on 10/05/2015 10:56:10 [name], [name] ([id number]) -------------------------------------------------------------------------------- Patient/Caregiver Education Details [name]. Date of Service: 10/05/2015 10:45 AM Medical Record Number: [id number] Patient Account Number: [id number] Date of Birth/Gender: [birth year] (60 y.o. Female) Treating RN: Montey Hora Primary Care Physician: [name] Other  Clinician: Referring Physician: [name] Treating Physician/Extender: Frann Rider in Treatment: 41 Education Assessment Education Provided To: Patient Education Topics Provided Wound/Skin Impairment: Handouts: Other: wound care as ordered Methods: Demonstration, Explain/Verbal Responses: State content correctly Electronic Signature(s) Signed: 10/05/2015 5:01:01 PM By: Montey Hora Entered By: Montey Hora on 10/05/2015 11:14:13 [name], [name] ([id number]) -------------------------------------------------------------------------------- Wound Assessment Details [name], [name] Date of Service: 10/05/2015 10:45 AM Medical Record Number: [id number] Patient Account Number: [id number] Date of Birth/Sex: Jun 11, 1956 (60 y.o. Female) Treating RN: Montey Hora Primary Care Physician: [name] Other Clinician: Referring Physician: [name] Treating Physician/Extender: Frann Rider in Treatment: 40 Wound Status Wound Number: 1 Primary Pressure Ulcer Etiology: Wound Location: Right Back Wound Open Wounding Event: Blister Status: Date Acquired: 10/19/2013 Comorbid Glaucoma, Anemia, Coronary Artery Weeks Of Treatment: 40 History: Disease, Hypertension, Type II Clustered Wound: No Diabetes, End Stage Renal Disease, Osteoarthritis, Neuropathy Photos Photo Uploaded By: Montey Hora on 10/05/2015 13:57:25 Wound Measurements Length: (cm) 2.2 Width: (cm) 4.4 Depth: (cm) 0.1 Area: (cm) 7.603 Volume: (cm) 0.76 % Reduction in Area: 80.2% % Reduction in Volume: 80.2% Epithelialization: Medium (34-66%) Tunneling: No Undermining: No Wound Description  Classification: Category/Stage II Wound Margin: Indistinct, nonvisible Exudate Amount: Medium Exudate Type: Sanguinous Exudate Color: red Foul Odor After Cleansing: No Wound Bed Granulation Amount: Large (67-100%) Exposed Structure Granulation Quality: Red, Hyper-granulation Fascia  Exposed: No Necrotic Amount: None Present (0%) Fat Layer Exposed: No [name], [name] ([id number]) Tendon Exposed: No Muscle Exposed: No Joint Exposed: No Bone Exposed: No Limited to Skin Breakdown Periwound Skin Texture Texture Color No Abnormalities Noted: No No Abnormalities Noted: No Callus: No Atrophie Blanche: No Crepitus: No Cyanosis: No Excoriation: No Ecchymosis: No Fluctuance: No Erythema: No Friable: No Hemosiderin Staining: No Induration: No Mottled: No Localized Edema: No Pallor: No Rash: No Rubor: No Scarring: No Temperature / Pain Moisture Temperature: No Abnormality No Abnormalities Noted: No Dry / Scaly: No Maceration: No Moist: Yes Wound Preparation Ulcer Cleansing: Rinsed/Irrigated with Saline Topical Anesthetic Applied: None Treatment Notes Wound #1 (Right Back) 1. Cleansed with: Clean wound with Normal Saline 2. Anesthetic Topical Lidocaine 4% cream to wound bed prior to debridement 3. Peri-wound Care: Skin Prep 4. Dressing Applied: Other dressing (specify in notes) Notes cutimed siltec sorbact Electronic Signature(s) Signed: 10/05/2015 5:01:01 PM By: Montey Hora Entered By: Montey Hora on 10/05/2015 10:56:00 Mukherjee, [name] ([id number]) -------------------------------------------------------------------------------- Vitals Details [name]. Date of Service: 10/05/2015 10:45 AM Medical Record Number: [id number] Patient Account Number: [id number] Date of Birth/Sex: Nov 15, 1955 (60 y.o. Female) Treating RN: Montey Hora Primary Care Physician: [name] Other Clinician: Referring Physician: [name] Treating Physician/Extender: Frann Rider in Treatment: 40 Vital Signs Time Taken: 10:49 Temperature (F): 98.2 Height (in): 65 Pulse (bpm): 85 Weight (lbs): 156 Respiratory Rate (breaths/min): 18 Body Mass Index (BMI): 26 Blood Pressure (mmHg): 164/66 Reference Range: 80 - 120 mg /  dl Electronic Signature(s) Signed: 10/05/2015 5:01:01 PM By: Montey Hora Entered By: Montey Hora on 10/05/2015 10:50:14

## 2015-10-12 ENCOUNTER — Encounter: Payer: Medicare Other | Admitting: Surgery

## 2015-10-12 DIAGNOSIS — E11622 Type 2 diabetes mellitus with other skin ulcer: Secondary | ICD-10-CM | POA: Diagnosis not present

## 2015-10-13 NOTE — Progress Notes (Signed)
ZYNAH, [name] ([id number]) Visit Report for 10/12/2015 Chief Complaint Document Details [name], [name] 10/12/2015 10:45 Date of Service: AM Medical Record [id number] Number: Patient Account Number: [id number] 1956-04-16 (60 y.o. Treating RN: [name] Date of Birth/Sex: Female) Other Clinician: Primary Care Physician: [name] Treating [name] Referring Physician: [name] Physician/Extender: [name]: 41 Information Obtained from: Patient Chief Complaint Patient presents to the wound care center for a consult due non healing wound. She has an open wound on her right upper back which she's had for about a year and she recently noticed a blister on her right lower extremity about 2 weeks ago. Electronic Signature(s) Signed: 10/12/2015 11:11:02 AM By: [name], [name] Entered By: [name] on 10/12/2015 11:11:02 Klann, [name] ([id number]) -------------------------------------------------------------------------------- HPI Details [name], [name] 10/12/2015 10:45 Date of Service: AM Medical Record [id number] Number: Patient Account Number: [id number] 03-Jul-1956 (60 y.o. Treating RN: [name] Date of Birth/Sex: Female) Other Clinician: Primary Care Physician: [name] Treating [name] Referring Physician: [name] Physician/Extender: [name]: 41 History of Present Illness Location: right upper back and right lower extremity wounds Quality: Patient reports No Pain. Severity: Patient states wound (s) are getting better. Duration: Patient has had the wound for > 3 months prior to seeking treatment at the wound center Timing: she thought it first occurred when she was using a heating pad about a year ago after back surgery. Context: The wound appeared gradually over time Modifying Factors: Patient is currently on renal dialysis and receives treatments 3 times weekly Associated  Signs and Symptoms: Patient reports having: surgery scheduled for this week for a AV fistula left arm. HPI Description: 60 year old patient who is known to be a diabetic and has end-stage renal disease has had several comorbidities including coronary artery disease, hypertension, hyperlipidemia, pancreatitis, anemia, previous history of hysterectomy, cholecystectomy, left-sided salivary gland excision, bilateral cataract surgery,Peritoneal dialysis catheter, hemodialysis catheter. the area on the back has also been caused by instant pressure she used to sleep on a recliner all day and has significant kyphoscoliosis. As far as the wound on her right lower extremity she's not sure how this blister occurred but she thought it has been there for about 2 weeks. No recent blood investigations available and no recent hemoglobin A1c. 12/30/2014 -- she is an assisted living facility but I believe the nurses that have not followed instructions as she had some cream applied on her back and there was a different dressing. Last week she's had a AV fistula placed on her left forearm. 01/13/2015 -- she has had some localized infection at the port site and she's been on doxycycline for this. 02/05/2015 - he has developed a small blister on her right lower extremity. 02/10/2015 -- she has developed another small blister on her right anterior chest wall in the area where she's had tape for her dialysis access. this may just be injury caused by a tape burn. 02/18/2015 -- no new blisters and she had a dermatology opinion and they have taken a biopsy of her skin. She also had a left brachial AV fistula placed this week. 03/03/2015 -- though we do not have the pathology report yet the patient says she has been put on prednisone because this skin disease is possibly to do with her immune system and her dermatologist is recommended this. 03/10/2015 -- she has developed a new blister which is quite large on her right  lower extremity on the shin.  05/22/2015 -- she did very well with her dressing but on trying to remove the [place] it peels away Mohl, Arthella S. ([id number]) the newly healed skin. 06/05/2015 -- the patient was in the ER this past week for severe bleeding from the right nostril and had to have ENT see her and do a packing. They've also been holding her heparin during her dialysis. The patient is going back to ENT today. Other than that she has had no other significant issues. 06/19/2015 -- she is back on her blood thinners and continues to have her hemodialysis as before. 07/31/2015 -- a large blister has popped up again on her right lower extremity in the same place in the mid shin area in the anterior lateral compartment. 10/05/2015 -- last week she was admitted to the hospital for 4 days as she had a pneumonia and was treated with IV and oral antibiotics. Electronic Signature(s) Signed: 10/12/2015 11:11:09 AM By: [name], [name] Entered By: [name] on 10/12/2015 11:11:09 Kunz, [name] ([id number]) -------------------------------------------------------------------------------- Physical Exam Details [name], [name] 10/12/2015 10:45 Date of Service: AM Medical Record [id number] Number: Patient Account Number: [id number] Jul 31, 1955 (60 y.o. Treating RN: [name] Date of Birth/Sex: Female) Other Clinician: Primary Care Physician: [name] Treating [name] Referring Physician: [name] Physician/Extender: [name]: 41 Constitutional . Pulse regular. Respirations normal and unlabored. Afebrile. . Eyes Nonicteric. Reactive to light. Ears, Nose, Mouth, and Throat Lips, teeth, and gums WNL.Marland Kitchen Moist mucosa without lesions. Neck supple and nontender. No palpable supraclavicular or cervical adenopathy. Normal sized without goiter. Respiratory WNL. No retractions.. Cardiovascular Pedal Pulses WNL. No clubbing, cyanosis or  edema. Lymphatic No adneopathy. No adenopathy. No adenopathy. Musculoskeletal Adexa without tenderness or enlargement.. Digits and nails w/o clubbing, cyanosis, infection, petechiae, ischemia, or inflammatory conditions.. Integumentary (Hair, Skin) No suspicious lesions. No crepitus or fluctuance. No peri-wound warmth or erythema. No masses.Marland Kitchen Psychiatric Judgement and insight Intact.. No evidence of depression, anxiety, or agitation.. Notes the wound looks excellent today and has no open ulcerated areas but there is very flimsy scar tissue and hence we will protect it with another Siltec Sorbact foam. Electronic Signature(s) Signed: 10/12/2015 11:12:06 AM By: [name], [name] Entered By: [name] on 10/12/2015 11:12:05 Vaillancourt, [name] ([id number]) -------------------------------------------------------------------------------- Physician Orders Details [name], [name] 10/12/2015 10:45 Date of Service: AM Medical Record [id number] Number: Patient Account Number: [id number] [birth year] (60 y.o. Treating RN: [name] Date of Birth/Sex: Female) Other Clinician: Primary Care Physician: [name] Treating Victor Langenbach Referring Physician: [name] Physician/Extender: [name]: 57 Verbal / Phone Orders: Yes Clinician: Montey [name] Read Back and Verified: Yes Diagnosis Coding Wound Cleansing Wound #1 Right Back o Clean wound with Normal Saline. Anesthetic Wound #1 Right Back o Topical Lidocaine 4% cream applied to wound bed prior to debridement Skin Barriers/Peri-Wound Care Wound #1 Right Back o Skin Prep Primary Wound Dressing Wound #1 Right Back o Other: - Siltec Sorbact - SNF RN may change dressing if needed as drainage pass white border. Extra piece given to patient Dressing Change Frequency Wound #1 Right Back o Change dressing every week - unless saturated. Gently pull of old dressing due to patient  thin epetheilized skin and may wet with saline if needed Follow-up Appointments Wound #1 Right Back o Return Appointment in 1 week. Electronic Signature(s) Signed: 10/12/2015 4:52:55 PM By: [name], [name] Signed: 10/12/2015 5:44:14 PM By: [name] Entered By: [name] on 10/12/2015  [id number] [name], [name] ([id number]) -------------------------------------------------------------------------------- Problem List Details [name], [name] 10/12/2015 10:45 Date of Service: AM Medical Record [id number] Number: Patient Account Number: [id number] [birth year] (60 y.o. Treating RN: [name] Date of Birth/Sex: Female) Other Clinician: Primary Care Physician: [name] Treating Shallen Luedke Referring Physician: [name] Physician/Extender: [name]: 41 Active Problems ICD-10 Encounter Code Description Active Date Diagnosis E11.622 Type 2 diabetes mellitus with other skin ulcer 12/23/2014 Yes L89.112 Pressure ulcer of right upper back, stage 2 12/23/2014 Yes S80.821A Blister (nonthermal), right lower leg, initial encounter 12/23/2014 Yes N18.6 End stage renal disease 12/23/2014 Yes Inactive Problems Resolved Problems Electronic Signature(s) Signed: 10/12/2015 11:10:52 AM By: [name], [name] Entered By: [name] on 10/12/2015 11:10:52 Bradner, [name] ([id number]) -------------------------------------------------------------------------------- Progress Note Details [name], BRADBURN. 10/12/2015 10:45 Date of Service: AM Medical Record [id number] Number: Patient Account Number: [id number] February 09, 1956 (60 y.o. Treating RN: [name] Date of Birth/Sex: Female) Other Clinician: Primary Care Physician: [name] Treating Soila Printup Referring Physician: [name] Physician/Extender: [name]: 41 Subjective Chief Complaint Information obtained from Patient Patient presents to the wound care  center for a consult due non healing wound. She has an open wound on her right upper back which she's had for about a year and she recently noticed a blister on her right lower extremity about 2 weeks ago. History of Present Illness (HPI) The following HPI elements were documented for the patient's wound: Location: right upper back and right lower extremity wounds Quality: Patient reports No Pain. Severity: Patient states wound (s) are getting better. Duration: Patient has had the wound for > 3 months prior to seeking treatment at the wound center Timing: she thought it first occurred when she was using a heating pad about a year ago after back surgery. Context: The wound appeared gradually over time Modifying Factors: Patient is currently on renal dialysis and receives treatments 3 times weekly Associated Signs and Symptoms: Patient reports having: surgery scheduled for this week for a AV fistula left arm. 60 year old patient who is known to be a diabetic and has end-stage renal disease has had several comorbidities including coronary artery disease, hypertension, hyperlipidemia, pancreatitis, anemia, previous history of hysterectomy, cholecystectomy, left-sided salivary gland excision, bilateral cataract surgery,Peritoneal dialysis catheter, hemodialysis catheter. the area on the back has also been caused by instant pressure she used to sleep on a recliner all day and has significant kyphoscoliosis. As far as the wound on her right lower extremity she's not sure how this blister occurred but she thought it has been there for about 2 weeks. No recent blood investigations available and no recent hemoglobin A1c. 12/30/2014 -- she is an assisted living facility but I believe the nurses that have not followed instructions as she had some cream applied on her back and there was a different dressing. Last week she's had a AV fistula placed on her left forearm. 01/13/2015 -- she has had some  localized infection at the port site and she's been on doxycycline for this. 02/05/2015 - he has developed a small blister on her right lower extremity. 02/10/2015 -- she has developed another small blister on her right anterior chest wall in the area where she's had tape for her dialysis access. this may just be injury caused by a tape burn. [name], [name] ([id number]) 02/18/2015 -- no new blisters and she had a dermatology opinion and they have taken a biopsy of her skin. She also had a left  brachial AV fistula placed this week. 03/03/2015 -- though we do not have the pathology report yet the patient says she has been put on prednisone because this skin disease is possibly to do with her immune system and her dermatologist is recommended this. 03/10/2015 -- she has developed a new blister which is quite large on her right lower extremity on the shin. 05/22/2015 -- she did very well with her dressing but on trying to remove the [place] it peels away the newly healed skin. 06/05/2015 -- the patient was in the ER this past week for severe bleeding from the right nostril and had to have ENT see her and do a packing. They've also been holding her heparin during her dialysis. The patient is going back to ENT today. Other than that she has had no other significant issues. 06/19/2015 -- she is back on her blood thinners and continues to have her hemodialysis as before. 07/31/2015 -- a large blister has popped up again on her right lower extremity in the same place in the mid shin area in the anterior lateral compartment. 10/05/2015 -- last week she was admitted to the hospital for 4 days as she had a pneumonia and was treated with IV and oral antibiotics. Objective Constitutional Pulse regular. Respirations normal and unlabored. Afebrile. Vitals Time Taken: 10:52 AM, Height: 65 in, Weight: 156 lbs, BMI: 26, Temperature: 97.8 F, Pulse: 86 bpm, Respiratory Rate: 18 breaths/min, Blood  Pressure: 195/80 mmHg. Eyes Nonicteric. Reactive to light. Ears, Nose, Mouth, and Throat Lips, teeth, and gums WNL.Marland Kitchen Moist mucosa without lesions. Neck supple and nontender. No palpable supraclavicular or cervical adenopathy. Normal sized without goiter. Respiratory WNL. No retractions.. Cardiovascular Pedal Pulses WNL. No clubbing, cyanosis or edema. [name], [name] ([id number]) Lymphatic No adneopathy. No adenopathy. No adenopathy. Musculoskeletal Adexa without tenderness or enlargement.. Digits and nails w/o clubbing, cyanosis, infection, petechiae, ischemia, or inflammatory conditions.Marland Kitchen Psychiatric Judgement and insight Intact.. No evidence of depression, anxiety, or agitation.. General Notes: the wound looks excellent today and has no open ulcerated areas but there is very flimsy scar tissue and hence we will protect it with another Siltec Sorbact foam. Integumentary (Hair, Skin) No suspicious lesions. No crepitus or fluctuance. No peri-wound warmth or erythema. No masses.. Wound #1 status is Open. Original cause of wound was Blister. The wound is located on the Right Back. The wound measures 2.5cm length x 3cm width x 0.1cm depth; 5.89cm^2 area and 0.589cm^3 volume. The wound is limited to skin breakdown. There is no tunneling or undermining noted. There is a medium amount of sanguinous drainage noted. The wound margin is indistinct and nonvisible. There is medium (34- 66%) red granulation within the wound bed. There is a medium (34-66%) amount of necrotic tissue within the wound bed including Eschar. The periwound skin appearance exhibited: Moist. The periwound skin appearance did not exhibit: Callus, Crepitus, Excoriation, Fluctuance, Friable, Induration, Localized Edema, Rash, Scarring, Dry/Scaly, Maceration, Atrophie Blanche, Cyanosis, Ecchymosis, Hemosiderin Staining, Mottled, Pallor, Rubor, Erythema. Periwound temperature was noted as No Abnormality. Assessment Active  Problems ICD-10 E11.622 - Type 2 diabetes mellitus with other skin ulcer L89.112 - Pressure ulcer of right upper back, stage 2 S80.821A - Blister (nonthermal), right lower leg, initial encounter N18.6 - End stage renal disease Plan Wound Cleansing: Wound #1 Right Back: Clean wound with Normal Saline. [name] ([id number]) Anesthetic: Wound #1 Right Back: Topical Lidocaine 4% cream applied to wound bed prior to debridement Skin Barriers/Peri-Wound Care: Wound #1 Right Back: Skin  Prep Primary Wound Dressing: Wound #1 Right Back: Other: - Siltec Sorbact - SNF RN may change dressing if needed as drainage pass white border. Extra piece given to patient Dressing Change Frequency: Wound #1 Right Back: Change dressing every week - unless saturated. Gently pull of old dressing due to patient thin epetheilized skin and may wet with saline if needed Follow-up Appointments: Wound #1 Right Back: Return Appointment in 1 week. We will try and continue with this same [place] through the week and if she remains healed next week we will discharge her from our services. Electronic Signature(s) Signed: 10/12/2015 11:12:56 AM By: [name], [name] Entered By: [name] on 10/12/2015 11:12:56 [name], [name] ([id number]) -------------------------------------------------------------------------------- SuperBill Details [name]. Date of Service: 10/12/2015 Medical Record Number: [id number] Patient Account Number: [id number] Date of Birth/Sex: February 23, 1956 (60 y.o. Female) Treating RN: [name] Primary Care Physician: [name] Other Clinician: Referring Physician: [name] Treating Physician/Extender: Frann Rider in Treatment: 41 Diagnosis Coding ICD-10 Codes Code Description E11.622 Type 2 diabetes mellitus with other skin ulcer L89.112 Pressure ulcer of right upper back, stage 2 S80.821A Blister (nonthermal), right lower leg,  initial encounter N18.6 End stage renal disease Facility Procedures CPT4 Code: [id number] Description: [phone] - WOUND CARE VISIT-LEV 2 EST PT Modifier: Quantity: 1 Physician Procedures CPT4 Code: [id number] Description: [id number] - WC PHYS LEVEL 3 - EST PT ICD-10 Description Diagnosis E11.622 Type 2 diabetes mellitus with other skin ulcer L89.112 Pressure ulcer of right upper back, stage 2 S80.821A Blister (nonthermal), right lower leg, initial enc N18.6 End  stage renal disease Modifier: ounter Quantity: 1 Electronic Signature(s) Signed: 10/12/2015 11:56:22 AM By: [name] Signed: 10/12/2015 4:52:55 PM By: [name], [name] Previous Signature: 10/12/2015 11:13:12 AM Version By: [name], [name] Entered By: [name] on 10/12/2015 11:56:21

## 2015-10-13 NOTE — Progress Notes (Signed)
SHARDASIA, BEVINGTON ([id number]) Visit Report for 10/12/2015 Arrival Information Details [name], [name]. Date of Service: 10/12/2015 10:45 AM Medical Record Number: [id number] Patient Account Number: [id number] Date of Birth/Sex: Apr 10, 1956 (60 y.o. Female) Treating RN: Montey Hora Primary Care Physician: [name] Other Clinician: Referring Physician: [name] Treating Physician/Extender: Frann Rider in Treatment: 12 Visit Information History Since Last Visit Added or deleted any medications: No Patient Arrived: Walker Any new allergies or adverse reactions: No Arrival Time: 10:50 Had a fall or experienced change in No Accompanied By: self activities of daily living that may affect Transfer Assistance: None risk of falls: Patient Identification Verified: Yes Signs or symptoms of abuse/neglect since last No Secondary Verification Process Yes visito Completed: Hospitalized since last visit: No Patient Requires Transmission- No Pain Present Now: No Based Precautions: Patient Has Alerts: Yes Patient Alerts: Patient on Blood Thinner Electronic Signature(s) Signed: 10/12/2015 5:44:14 PM By: Montey Hora Entered By: Montey Hora on 10/12/2015 10:52:17 Pischke, [name] ([id number]) -------------------------------------------------------------------------------- Clinic Level of Care Assessment Details [name]. Date of Service: 10/12/2015 10:45 AM Medical Record Number: [id number] Patient Account Number: [id number] Date of Birth/Sex: [birth year] (60 y.o. Female) Treating RN: Montey Hora Primary Care Physician: [name] Other Clinician: Referring Physician: [name] Treating Physician/Extender: Frann Rider in Treatment: 53 Clinic Level of Care Assessment Items TOOL 4 Quantity Score []  - Use when only an EandM is performed on FOLLOW-UP visit 0 ASSESSMENTS - Nursing Assessment / Reassessment X - Reassessment of  Co-morbidities (includes updates in patient status) 1 10 X - Reassessment of Adherence to Treatment Plan 1 5 ASSESSMENTS - Wound and Skin Assessment / Reassessment X - Simple Wound Assessment / Reassessment - one wound 1 5 []  - Complex Wound Assessment / Reassessment - multiple wounds 0 []  - Dermatologic / Skin Assessment (not related to wound area) 0 ASSESSMENTS - Focused Assessment []  - Circumferential Edema Measurements - multi extremities 0 []  - Nutritional Assessment / Counseling / Intervention 0 []  - Lower Extremity Assessment (monofilament, tuning fork, pulses) 0 []  - Peripheral Arterial Disease Assessment (using hand held doppler) 0 ASSESSMENTS - Ostomy and/or Continence Assessment and Care []  - Incontinence Assessment and Management 0 []  - Ostomy Care Assessment and Management (repouching, etc.) 0 PROCESS - Coordination of Care X - Simple Patient / Family Education for ongoing care 1 15 []  - Complex (extensive) Patient / Family Education for ongoing care 0 []  - Staff obtains Programmer, systems, Records, Test Results / Process Orders 0 []  - Staff telephones HHA, Nursing Homes / Clarify orders / etc 0 []  - Routine Transfer to another Facility (non-emergent condition) 0 Stingley, Donita S. ([id number]) []  - Routine Hospital Admission (non-emergent condition) 0 []  - New Admissions / Biomedical engineer / Ordering NPWT, Apligraf, etc. 0 []  - Emergency Hospital Admission (emergent condition) 0 X - Simple Discharge Coordination 1 10 []  - Complex (extensive) Discharge Coordination 0 PROCESS - Special Needs []  - Pediatric / Minor Patient Management 0 []  - Isolation Patient Management 0 []  - Hearing / Language / Visual special needs 0 []  - Assessment of Community assistance (transportation, D/C planning, etc.) 0 []  - Additional assistance / Altered mentation 0 []  - Support Surface(s) Assessment (bed, cushion, seat, etc.) 0 INTERVENTIONS - Wound Cleansing / Measurement X - Simple Wound  Cleansing - one wound 1 5 []  - Complex Wound Cleansing - multiple wounds 0 X - Wound Imaging (photographs - any number of wounds) 1 5 []  - Wound  Tracing (instead of photographs) 0 X - Simple Wound Measurement - one wound 1 5 []  - Complex Wound Measurement - multiple wounds 0 INTERVENTIONS - Wound Dressings X - Small Wound Dressing one or multiple wounds 1 10 []  - Medium Wound Dressing one or multiple wounds 0 []  - Large Wound Dressing one or multiple wounds 0 []  - Application of Medications - topical 0 []  - Application of Medications - injection 0 INTERVENTIONS - Miscellaneous []  - External ear exam 0 Stankiewicz, Katty S. ([id number]) []  - Specimen Collection (cultures, biopsies, blood, body fluids, etc.) 0 []  - Specimen(s) / Culture(s) sent or taken to Lab for analysis 0 []  - Patient Transfer (multiple staff / Harrel Lemon Lift / Similar devices) 0 []  - Simple Staple / Suture removal (25 or less) 0 []  - Complex Staple / Suture removal (26 or more) 0 []  - Hypo / Hyperglycemic Management (close monitor of Blood Glucose) 0 []  - Ankle / Brachial Index (ABI) - do not check if billed separately 0 X - Vital Signs 1 5 Has the patient been seen at the hospital within the last three years: Yes Total Score: 75 Level Of Care: New/Established - Level 2 Electronic Signature(s) Signed: 10/12/2015 11:56:11 AM By: Montey Hora Entered By: Montey Hora on 10/12/2015 11:56:10 Wavra, [name] ([id number]) -------------------------------------------------------------------------------- Encounter Discharge Information Details [name]. Date of Service: 10/12/2015 10:45 AM Medical Record Number: [id number] Patient Account Number: [id number] Date of Birth/Sex: 11-May-1956 (60 y.o. Female) Treating RN: Montey Hora Primary Care Physician: [name] Other Clinician: Referring Physician: [name] Treating Physician/Extender: Frann Rider in Treatment: 32 Encounter Discharge  Information Items Discharge Pain Level: 0 Discharge Condition: Stable Ambulatory Status: Walker Discharge Destination: Nursing Home Transportation: Private Auto Accompanied By: self Schedule Follow-up Appointment: Yes Medication Reconciliation completed No and provided to Patient/Care Emersen Carroll: Provided on Clinical Summary of Care: 10/12/2015 Form Type Recipient Paper Patient LD Electronic Signature(s) Signed: 10/12/2015 11:57:08 AM By: Montey Hora Previous Signature: 10/12/2015 11:11:11 AM Version By: Ruthine Dose Entered By: Montey Hora on 10/12/2015 11:57:08 Troxler, [name] ([id number]) -------------------------------------------------------------------------------- Multi Wound Chart Details [name]. Date of Service: 10/12/2015 10:45 AM Medical Record Number: [id number] Patient Account Number: [id number] Date of Birth/Sex: [birth year] (60 y.o. Female) Treating RN: Montey Hora Primary Care Physician: [name] Other Clinician: Referring Physician: [name] Treating Physician/Extender: Frann Rider in Treatment: 41 Vital Signs Height(in): 65 Pulse(bpm): 86 Weight(lbs): 156 Blood Pressure 195/80 (mmHg): Body Mass Index(BMI): 26 Temperature(F): 97.8 Respiratory Rate 18 (breaths/min): Photos: [1:No Photos] [N/A:N/A] Wound Location: [1:Right Back] [N/A:N/A] Wounding Event: [1:Blister] [N/A:N/A] Primary Etiology: [1:Pressure Ulcer] [N/A:N/A] Comorbid History: [1:Glaucoma, Anemia, Coronary Artery Disease, Hypertension, Type II Diabetes, End Stage Renal Disease, Osteoarthritis, Neuropathy] [N/A:N/A] Date Acquired: [1:10/19/2013] [N/A:N/A] Weeks of Treatment: [1:41] [N/A:N/A] Wound Status: [1:Open] [N/A:N/A] Measurements L x W x D 2.5x3x0.1 [N/A:N/A] (cm) Area (cm) : [1:5.89] [N/A:N/A] Volume (cm) : [1:0.589] [N/A:N/A] % Reduction in Area: [1:84.70%] [N/A:N/A] % Reduction in Volume: 84.70% [N/A:N/A] Classification:  [1:Category/Stage II] [N/A:N/A] Exudate Amount: [1:Medium] [N/A:N/A] Exudate Type: [1:Sanguinous] [N/A:N/A] Exudate Color: [1:red] [N/A:N/A] Wound Margin: [1:Indistinct, nonvisible] [N/A:N/A] Granulation Amount: [1:Medium (34-66%)] [N/A:N/A] Granulation Quality: [1:Red, Hyper-granulation] [N/A:N/A] Necrotic Amount: [1:Medium (34-66%)] [N/A:N/A] Necrotic Tissue: [1:Eschar] [N/A:N/A] Exposed Structures: [1:Fascia: No Fat: No] [N/A:N/A] Tendon: No Muscle: No Joint: No Bone: No Limited to Skin Breakdown Epithelialization: Medium (34-66%) N/A N/A Periwound Skin Texture: Edema: No N/A N/A Excoriation: No Induration: No Callus: No Crepitus: No Fluctuance: No Friable: No Rash: No  Scarring: No Periwound Skin Moist: Yes N/A N/A Moisture: Maceration: No Dry/Scaly: No Periwound Skin Color: Atrophie Blanche: No N/A N/A Cyanosis: No Ecchymosis: No Erythema: No Hemosiderin Staining: No Mottled: No Pallor: No Rubor: No Temperature: No Abnormality N/A N/A Tenderness on No N/A N/A Palpation: Wound Preparation: Ulcer Cleansing: N/A N/A Rinsed/Irrigated with Saline Topical Anesthetic Applied: None Treatment Notes Electronic Signature(s) Signed: 10/12/2015 5:44:14 PM By: Montey Hora Entered By: Montey Hora on 10/12/2015 11:05:25 Kreft, [name] ([id number]) -------------------------------------------------------------------------------- [place] Details [name]. Date of Service: 10/12/2015 10:45 AM Medical Record Number: [id number] Patient Account Number: [id number] Date of Birth/Sex: 1955-11-11 (60 y.o. Female) Treating RN: Montey Hora Primary Care Physician: [name] Other Clinician: Referring Physician: [name] Treating Physician/Extender: Frann Rider in Treatment: 52 Active Inactive Abuse / Safety / Falls / Self Care Management Nursing Diagnoses: Potential for falls Goals: Patient will remain injury  free Date Initiated: 12/23/2014 Goal Status: Active Patient/caregiver will verbalize understanding of skin care regimen Date Initiated: 12/23/2014 Goal Status: Active Patient/caregiver will verbalize/demonstrate measures taken to prevent injury and/or falls Date Initiated: 12/23/2014 Goal Status: Active Patient/caregiver will verbalize/demonstrate understanding of what to do in case of emergency Date Initiated: 12/23/2014 Goal Status: Active Interventions: Assess fall risk on admission and as needed Assess: immobility, friction, shearing, incontinence upon admission and as needed Assess impairment of mobility on admission and as needed per policy Assess self care needs on admission and as needed Provide education on fall prevention Notes: Wound/Skin Impairment Nursing Diagnoses: Impaired tissue integrity Knowledge deficit related to ulceration/compromised skin integrity Goals: Patient/caregiver will verbalize understanding of skin care regimen SALSABEEL, KINKADE ([id number]) Date Initiated: 12/23/2014 Goal Status: Active Ulcer/skin breakdown will heal within 14 weeks Date Initiated: 12/23/2014 Goal Status: Active Interventions: Assess patient/caregiver ability to obtain necessary supplies Assess patient/caregiver ability to perform ulcer/skin care regimen upon admission and as needed Assess ulceration(s) every visit Provide education on ulcer and skin care Treatment Activities: Skin care regimen initiated : 10/12/2015 Topical wound management initiated : 10/12/2015 Notes: Electronic Signature(s) Signed: 10/12/2015 5:44:14 PM By: Montey Hora Entered By: Montey Hora on 10/12/2015 11:05:00 Farha, [name] ([id number]) -------------------------------------------------------------------------------- Patient/Caregiver Education Details [name]. Date of Service: 10/12/2015 10:45 AM Medical Record Number: [id number] Patient Account Number: [id number] Date of  Birth/Gender: 1956/02/25 (60 y.o. Female) Treating RN: Montey Hora Primary Care Physician: [name] Other Clinician: Referring Physician: [name] Treating Physician/Extender: Frann Rider in Treatment: 72 Education Assessment Education Provided To: Patient Education Topics Provided Nutrition: Handouts: Nutrition Methods: Explain/Verbal Responses: State content correctly Electronic Signature(s) Signed: 10/12/2015 11:57:21 AM By: Montey Hora Entered By: Montey Hora on 10/12/2015 11:57:20 Alcindor, [name] ([id number]) -------------------------------------------------------------------------------- Wound Assessment Details Patient Name: Axton, Tonja S. Date of Service: 10/12/2015 10:45 AM Medical Record Number: [id number] Patient Account Number: [id number] Date of Birth/Sex: May 04, 1956 (60 y.o. Female) Treating RN: Montey Hora Primary Care Physician: [name] Other Clinician: Referring Physician: [name] Treating Physician/Extender: Frann Rider in Treatment: 41 Wound Status Wound Number: 1 Primary Pressure Ulcer Etiology: Wound Location: Right Back Wound Open Wounding Event: Blister Status: Date Acquired: 10/19/2013 Comorbid Glaucoma, Anemia, Coronary Artery Weeks Of Treatment: 41 History: Disease, Hypertension, Type II Clustered Wound: No Diabetes, End Stage Renal Disease, Osteoarthritis, Neuropathy Photos Photo Uploaded By: Montey Hora on 10/12/2015 17:13:23 Wound Measurements Length: (cm) 2.5 Width: (cm) 3 Depth: (cm) 0.1 Area: (cm) 5.89 Volume: (cm) 0.589 % Reduction in Area: 84.7% % Reduction in Volume: 84.7% Epithelialization: Medium (  34-66%) Tunneling: No Undermining: No Wound Description Classification: Category/Stage II Wound Margin: Indistinct, nonvisible Exudate Amount: Medium Exudate Type: Sanguinous Exudate Color: red Foul Odor After Cleansing: No Wound Bed Granulation Amount: Medium (34-66%)  Exposed Structure Granulation Quality: Red, Hyper-granulation Fascia Exposed: No Necrotic Amount: Medium (34-66%) Fat Layer Exposed: No Quinones, Azilee S. ([id number]) Necrotic Quality: Eschar Tendon Exposed: No Muscle Exposed: No Joint Exposed: No Bone Exposed: No Limited to Skin Breakdown Periwound Skin Texture Texture Color No Abnormalities Noted: No No Abnormalities Noted: No Callus: No Atrophie Blanche: No Crepitus: No Cyanosis: No Excoriation: No Ecchymosis: No Fluctuance: No Erythema: No Friable: No Hemosiderin Staining: No Induration: No Mottled: No Localized Edema: No Pallor: No Rash: No Rubor: No Scarring: No Temperature / Pain Moisture Temperature: No Abnormality No Abnormalities Noted: No Dry / Scaly: No Maceration: No Moist: Yes Wound Preparation Ulcer Cleansing: Rinsed/Irrigated with Saline Topical Anesthetic Applied: None Treatment Notes Wound #1 (Right Back) 1. Cleansed with: Clean wound with Normal Saline 3. Peri-wound Care: Skin Prep 4. Dressing Applied: Other dressing (specify in notes) Notes cutimed siltec sorbact Electronic Signature(s) Signed: 10/12/2015 5:44:14 PM By: Montey Hora Entered By: Montey Hora on 10/12/2015 11:00:12 Admire, [name] ([id number]) -------------------------------------------------------------------------------- Vitals Details [name]. Date of Service: 10/12/2015 10:45 AM Medical Record Number: [id number] Patient Account Number: [id number] Date of Birth/Sex: 14-Jun-1956 (60 y.o. Female) Treating RN: Montey Hora Primary Care Physician: [name] Other Clinician: Referring Physician: [name] Treating Physician/Extender: Frann Rider in Treatment: 41 Vital Signs Time Taken: 10:52 Temperature (F): 97.8 Height (in): 65 Pulse (bpm): 86 Weight (lbs): 156 Respiratory Rate (breaths/min): 18 Body Mass Index (BMI): 26 Blood Pressure (mmHg): 195/80 Reference Range: 80 -  120 mg / dl Electronic Signature(s) Signed: 10/12/2015 5:44:14 PM By: Montey Hora Entered By: Montey Hora on 10/12/2015 10:53:40

## 2015-10-19 ENCOUNTER — Encounter: Payer: Medicare Other | Attending: Surgery | Admitting: Surgery

## 2015-10-19 DIAGNOSIS — D649 Anemia, unspecified: Secondary | ICD-10-CM | POA: Diagnosis not present

## 2015-10-19 DIAGNOSIS — I2510 Atherosclerotic heart disease of native coronary artery without angina pectoris: Secondary | ICD-10-CM | POA: Insufficient documentation

## 2015-10-19 DIAGNOSIS — I120 Hypertensive chronic kidney disease with stage 5 chronic kidney disease or end stage renal disease: Secondary | ICD-10-CM | POA: Diagnosis not present

## 2015-10-19 DIAGNOSIS — X58XXXA Exposure to other specified factors, initial encounter: Secondary | ICD-10-CM | POA: Insufficient documentation

## 2015-10-19 DIAGNOSIS — E785 Hyperlipidemia, unspecified: Secondary | ICD-10-CM | POA: Insufficient documentation

## 2015-10-19 DIAGNOSIS — Z992 Dependence on renal dialysis: Secondary | ICD-10-CM | POA: Diagnosis not present

## 2015-10-19 DIAGNOSIS — E1122 Type 2 diabetes mellitus with diabetic chronic kidney disease: Secondary | ICD-10-CM | POA: Insufficient documentation

## 2015-10-19 DIAGNOSIS — N186 End stage renal disease: Secondary | ICD-10-CM | POA: Diagnosis not present

## 2015-10-19 DIAGNOSIS — E11622 Type 2 diabetes mellitus with other skin ulcer: Secondary | ICD-10-CM | POA: Insufficient documentation

## 2015-10-19 DIAGNOSIS — S80821A Blister (nonthermal), right lower leg, initial encounter: Secondary | ICD-10-CM | POA: Diagnosis not present

## 2015-10-19 DIAGNOSIS — L89112 Pressure ulcer of right upper back, stage 2: Secondary | ICD-10-CM | POA: Insufficient documentation

## 2015-10-20 NOTE — Progress Notes (Signed)
[name] ([id number]) Visit Report for 10/19/2015 Chief Complaint Document Details [name], [name]. Date of Service: 10/19/2015 10:45 AM Medical Record Number: [id number] Patient Account Number: [id number] Date of Birth/Sex: 1955-09-27 (60 y.o. Female) Treating RN: Montey Hora Primary Care Physician: [name] Other Clinician: Referring Physician: [name] Treating Physician/Extender: Frann Rider in Treatment: 25 Information Obtained from: Patient Chief Complaint Patient presents to the wound care center for a consult due non healing wound. She has an open wound on her right upper back which she's had for about a year and she recently noticed a blister on her right lower extremity about 2 weeks ago. Electronic Signature(s) Signed: 10/19/2015 11:57:37 AM By: [name], [name] Entered By: [name] on 10/19/2015 11:57:36 [name], [name] ([id number]) -------------------------------------------------------------------------------- HPI Details [name], [name]. Date of Service: 10/19/2015 10:45 AM Medical Record Number: [id number] Patient Account Number: [id number] Date of Birth/Sex: 08/18/1955 (60 y.o. Female) Treating RN: Montey Hora Primary Care Physician: [name] Other Clinician: Referring Physician: [name] Treating Physician/Extender: Frann Rider in Treatment: 6 History of Present Illness Location: right upper back and right lower extremity wounds Quality: Patient reports No Pain. Severity: Patient states wound (s) are getting better. Duration: Patient has had the wound for > 3 months prior to seeking treatment at the wound center Timing: she thought it first occurred when she was using a heating pad about a year ago after back surgery. Context: The wound appeared gradually over time Modifying Factors: Patient is currently on renal dialysis and receives treatments 3 times weekly Associated Signs and  Symptoms: Patient reports having: surgery scheduled for this week for a AV fistula left arm. HPI Description: 61 year old patient who is known to be a diabetic and has end-stage renal disease has had several comorbidities including coronary artery disease, hypertension, hyperlipidemia, pancreatitis, anemia, previous history of hysterectomy, cholecystectomy, left-sided salivary gland excision, bilateral cataract surgery,Peritoneal dialysis catheter, hemodialysis catheter. the area on the back has also been caused by instant pressure she used to sleep on a recliner all day and has significant kyphoscoliosis. As far as the wound on her right lower extremity she's not sure how this blister occurred but she thought it has been there for about 2 weeks. No recent blood investigations available and no recent hemoglobin A1c. 12/30/2014 -- she is an assisted living facility but I believe the nurses that have not followed instructions as she had some cream applied on her back and there was a different dressing. Last week she's had a AV fistula placed on her left forearm. 01/13/2015 -- she has had some localized infection at the port site and she's been on doxycycline for this. 02/05/2015 - he has developed a small blister on her right lower extremity. 02/10/2015 -- she has developed another small blister on her right anterior chest wall in the area where she's had tape for her dialysis access. this may just be injury caused by a tape burn. 02/18/2015 -- no new blisters and she had a dermatology opinion and they have taken a biopsy of her skin. She also had a left brachial AV fistula placed this week. 03/03/2015 -- though we do not have the pathology report yet the patient says she has been put on prednisone because this skin disease is possibly to do with her immune system and her dermatologist is recommended this. 03/10/2015 -- she has developed a new blister which is quite large on her right lower  extremity on the shin.  05/22/2015 -- she did very well with her dressing but on trying to remove the [place] it peels away the newly healed skin. [name] ([id number]) 06/05/2015 -- the patient was in the ER this past week for severe bleeding from the right nostril and had to have ENT see her and do a packing. They've also been holding her heparin during her dialysis. The patient is going back to ENT today. Other than that she has had no other significant issues. 06/19/2015 -- she is back on her blood thinners and continues to have her hemodialysis as before. 07/31/2015 -- a large blister has popped up again on her right lower extremity in the same place in the mid shin area in the anterior lateral compartment. 10/05/2015 -- last week she was admitted to the hospital for 4 days as she had a pneumonia and was treated with IV and oral antibiotics. Electronic Signature(s) Signed: 10/19/2015 11:57:42 AM By: [name], [name] Entered By: [name] on 10/19/2015 11:57:42 [name], [name] ([id number]) -------------------------------------------------------------------------------- Physical Exam Details [name], [name]. Date of Service: 10/19/2015 10:45 AM Medical Record Number: [id number] Patient Account Number: [id number] Date of Birth/Sex: 17-May-1956 (60 y.o. Female) Treating RN: Montey Hora Primary Care Physician: [name] Other Clinician: Referring Physician: [name] Treating Physician/Extender: Frann Rider in Treatment: 42 Constitutional . Pulse regular. Respirations normal and unlabored. Afebrile. . Eyes Nonicteric. Reactive to light. Ears, Nose, Mouth, and Throat Lips, teeth, and gums WNL.Marland Kitchen Moist mucosa without lesions. Neck supple and nontender. No palpable supraclavicular or cervical adenopathy. Normal sized without goiter. Respiratory WNL. No retractions.. Cardiovascular Pedal Pulses WNL. No clubbing, cyanosis or  edema. Lymphatic No adneopathy. No adenopathy. No adenopathy. Musculoskeletal Adexa without tenderness or enlargement.. Digits and nails w/o clubbing, cyanosis, infection, petechiae, ischemia, or inflammatory conditions.. Integumentary (Hair, Skin) No suspicious lesions. No crepitus or fluctuance. No peri-wound warmth or erythema. No masses.Marland Kitchen Psychiatric Judgement and insight Intact.. No evidence of depression, anxiety, or agitation.. Notes the wound is looking very good today and there are no open ulcerated areas but there are couple of micro- perforations has not been using much. Electronic Signature(s) Signed: 10/19/2015 11:58:10 AM By: [name], [name] Entered By: [name] on 10/19/2015 11:58:09 [name], [name] ([id number]) -------------------------------------------------------------------------------- Physician Orders Details [name]. Date of Service: 10/19/2015 10:45 AM Medical Record Number: [id number] Patient Account Number: [id number] Date of Birth/Sex: 1956-02-19 (60 y.o. Female) Treating RN: Montey Hora Primary Care Physician: [name] Other Clinician: Referring Physician: [name] Treating Physician/Extender: Frann Rider in Treatment: 39 Verbal / Phone Orders: Yes Clinician: Montey Hora Read Back and Verified: Yes Diagnosis Coding Wound Cleansing Wound #1 Right Back o Clean wound with Normal Saline. Skin Barriers/Peri-Wound Care Wound #1 Right Back o Skin Prep Primary Wound Dressing Wound #1 Right Back o Other: - Siltec Sorbact - SNF RN may change dressing if needed as drainage pass white border. Extra piece given to patient. Dressing Change Frequency Wound #1 Right Back o Change dressing every week - unless saturated. Gently pull of old dressing due to patient thin epetheilized skin and may wet with saline if needed. SNF RN to change dressing next week Follow-up Appointments Wound #1 Right Back o  Return Appointment in 2 weeks. - SNF RN to change dressing next week Electronic Signature(s) Signed: 10/19/2015 3:32:20 PM By: [name], [name] Signed: 10/19/2015 5:22:46 PM By: Montey Hora Entered By: Montey Hora on 10/19/2015 11:46:52 [name], [name] ([id number]) --------------------------------------------------------------------------------  Problem List Details Patient Name: ALYSE, SCHNAUTZ. Date of Service: 10/19/2015 10:45 AM Medical Record Number: [id number] Patient Account Number: [id number] Date of Birth/Sex: 06-07-1956 (60 y.o. Female) Treating RN: Montey Hora Primary Care Physician: [name] Other Clinician: Referring Physician: [name] Treating Physician/Extender: Frann Rider in Treatment: 23 Active Problems ICD-10 Encounter Code Description Active Date Diagnosis E11.622 Type 2 diabetes mellitus with other skin ulcer 12/23/2014 Yes L89.112 Pressure ulcer of right upper back, stage 2 12/23/2014 Yes S80.821A Blister (nonthermal), right lower leg, initial encounter 12/23/2014 Yes N18.6 End stage renal disease 12/23/2014 Yes Inactive Problems Resolved Problems Electronic Signature(s) Signed: 10/19/2015 11:57:29 AM By: [name], [name] Entered By: [name] on 10/19/2015 11:57:29 [name], [name] ([id number]) -------------------------------------------------------------------------------- Progress Note Details [name], [name] Date of Service: 10/19/2015 10:45 AM Medical Record Number: [id number] Patient Account Number: [id number] Date of Birth/Sex: [birth year] (60 y.o. Female) Treating RN: Montey Hora Primary Care Physician: [name] Other Clinician: Referring Physician: [name] Treating Physician/Extender: Frann Rider in Treatment: 6 Subjective Chief Complaint Information obtained from Patient Patient presents to the wound care center for a consult due non healing wound. She has an open wound on her right  upper back which she's had for about a year and she recently noticed a blister on her right lower extremity about 2 weeks ago. History of Present Illness (HPI) The following HPI elements were documented for the patient's wound: Location: right upper back and right lower extremity wounds Quality: Patient reports No Pain. Severity: Patient states wound (s) are getting better. Duration: Patient has had the wound for > 3 months prior to seeking treatment at the wound center Timing: she thought it first occurred when she was using a heating pad about a year ago after back surgery. Context: The wound appeared gradually over time Modifying Factors: Patient is currently on renal dialysis and receives treatments 3 times weekly Associated Signs and Symptoms: Patient reports having: surgery scheduled for this week for a AV fistula left arm. 60 year old patient who is known to be a diabetic and has end-stage renal disease has had several comorbidities including coronary artery disease, hypertension, hyperlipidemia, pancreatitis, anemia, previous history of hysterectomy, cholecystectomy, left-sided salivary gland excision, bilateral cataract surgery,Peritoneal dialysis catheter, hemodialysis catheter. the area on the back has also been caused by instant pressure she used to sleep on a recliner all day and has significant kyphoscoliosis. As far as the wound on her right lower extremity she's not sure how this blister occurred but she thought it has been there for about 2 weeks. No recent blood investigations available and no recent hemoglobin A1c. 12/30/2014 -- she is an assisted living facility but I believe the nurses that have not followed instructions as she had some cream applied on her back and there was a different dressing. Last week she's had a AV fistula placed on her left forearm. 01/13/2015 -- she has had some localized infection at the port site and she's been on doxycycline for  this. 02/05/2015 - he has developed a small blister on her right lower extremity. 02/10/2015 -- she has developed another small blister on her right anterior chest wall in the area where she's had tape for her dialysis access. this may just be injury caused by a tape burn. 02/18/2015 -- no new blisters and she had a dermatology opinion and they have taken a biopsy of her skin. [name], [name] ([id number]) She also had a left brachial AV fistula placed this week.  03/03/2015 -- though we do not have the pathology report yet the patient says she has been put on prednisone because this skin disease is possibly to do with her immune system and her dermatologist is recommended this. 03/10/2015 -- she has developed a new blister which is quite large on her right lower extremity on the shin. 05/22/2015 -- she did very well with her dressing but on trying to remove the [place] it peels away the newly healed skin. 06/05/2015 -- the patient was in the ER this past week for severe bleeding from the right nostril and had to have ENT see her and do a packing. They've also been holding her heparin during her dialysis. The patient is going back to ENT today. Other than that she has had no other significant issues. 06/19/2015 -- she is back on her blood thinners and continues to have her hemodialysis as before. 07/31/2015 -- a large blister has popped up again on her right lower extremity in the same place in the mid shin area in the anterior lateral compartment. 10/05/2015 -- last week she was admitted to the hospital for 4 days as she had a pneumonia and was treated with IV and oral antibiotics. Objective Constitutional Pulse regular. Respirations normal and unlabored. Afebrile. Vitals Time Taken: 11:28 AM, Height: 65 in, Weight: 156 lbs, BMI: 26, Temperature: 98.1 F, Pulse: 90 bpm, Respiratory Rate: 18 breaths/min, Blood Pressure: 197/89 mmHg. Eyes Nonicteric. Reactive to light. Ears, Nose,  Mouth, and Throat Lips, teeth, and gums WNL.Marland Kitchen Moist mucosa without lesions. Neck supple and nontender. No palpable supraclavicular or cervical adenopathy. Normal sized without goiter. Respiratory WNL. No retractions.. Cardiovascular Pedal Pulses WNL. No clubbing, cyanosis or edema. Lymphatic No adneopathy. No adenopathy. No adenopathy. [name] ([id number]) Musculoskeletal Adexa without tenderness or enlargement.. Digits and nails w/o clubbing, cyanosis, infection, petechiae, ischemia, or inflammatory conditions.Marland Kitchen Psychiatric Judgement and insight Intact.. No evidence of depression, anxiety, or agitation.. General Notes: the wound is looking very good today and there are no open ulcerated areas but there are couple of micro-perforations has not been using much. Integumentary (Hair, Skin) No suspicious lesions. No crepitus or fluctuance. No peri-wound warmth or erythema. No masses.. Wound #1 status is Open. Original cause of wound was Blister. The wound is located on the Right Back. The wound measures 2.5cm length x 2.5cm width x 0.1cm depth; 4.909cm^2 area and 0.491cm^3 volume. The wound is limited to skin breakdown. There is no tunneling or undermining noted. There is a small amount of sanguinous drainage noted. The wound margin is indistinct and nonvisible. There is large (67- 100%) red granulation within the wound bed. There is no necrotic tissue within the wound bed. The periwound skin appearance exhibited: Moist. The periwound skin appearance did not exhibit: Callus, Crepitus, Excoriation, Fluctuance, Friable, Induration, Localized Edema, Rash, Scarring, Dry/Scaly, Maceration, Atrophie Blanche, Cyanosis, Ecchymosis, Hemosiderin Staining, Mottled, Pallor, Rubor, Erythema. Periwound temperature was noted as No Abnormality. Assessment Active Problems ICD-10 E11.622 - Type 2 diabetes mellitus with other skin ulcer L89.112 - Pressure ulcer of right upper back, stage 2 S80.821A  - Blister (nonthermal), right lower leg, initial encounter N18.6 - End stage renal disease Plan Wound Cleansing: Wound #1 Right Back: Clean wound with Normal Saline. Skin Barriers/Peri-Wound Care: Wound #1 Right Back: [name], [name] ([id number]) Skin Prep Primary Wound Dressing: Wound #1 Right Back: Other: - Siltec Sorbact - SNF RN may change dressing if needed as drainage pass white border. Extra piece given to patient. Dressing Change  Frequency: Wound #1 Right Back: Change dressing every week - unless saturated. Gently pull of old dressing due to patient thin epetheilized skin and may wet with saline if needed. SNF RN to change dressing next week Follow-up Appointments: Wound #1 Right Back: Return Appointment in 2 weeks. - SNF RN to change dressing next week We will try and continue with this same [place] through the week and if she remains healed next week we will discharge her from our services. Electronic Signature(s) Signed: 10/19/2015 3:34:08 PM By: [name], [name] Previous Signature: 10/19/2015 11:58:39 AM Version By: [name], [name] Entered By: [name] on 10/19/2015 15:34:08 Corne, [name] ([id number]) -------------------------------------------------------------------------------- SuperBill Details [name]. Date of Service: 10/19/2015 Medical Record Number: [id number] Patient Account Number: [id number] Date of Birth/Sex: 28-Sep-1955 (60 y.o. Female) Treating RN: Montey Hora Primary Care Physician: [name] Other Clinician: Referring Physician: [name] Treating Physician/Extender: Frann Rider in Treatment: 42 Diagnosis Coding ICD-10 Codes Code Description E11.622 Type 2 diabetes mellitus with other skin ulcer L89.112 Pressure ulcer of right upper back, stage 2 S80.821A Blister (nonthermal), right lower leg, initial encounter N18.6 End stage renal disease Facility Procedures CPT4 Code:  [id number] Description: [id number] - WOUND CARE VISIT-LEV 2 EST PT Modifier: Quantity: 1 Physician Procedures CPT4 Code: [id number] Description: 99213 - WC PHYS LEVEL 3 - EST PT ICD-10 Description Diagnosis E11.622 Type 2 diabetes mellitus with other skin ulcer L89.112 Pressure ulcer of right upper back, stage 2 S80.821A Blister (nonthermal), right lower leg, initial enc N18.6 End  stage renal disease Modifier: ounter Quantity: 1 Electronic Signature(s) Signed: 10/19/2015 3:32:20 PM By: [name], [name] Signed: 10/19/2015 5:22:46 PM By: Montey Hora Previous Signature: 10/19/2015 11:58:59 AM Version By: [name], [name] Entered By: Montey Hora on 10/19/2015 12:02:45

## 2015-10-20 NOTE — Progress Notes (Signed)
[name], [name] ([id number]) Visit Report for 10/19/2015 Arrival Information Details [name], [name]. Date of Service: 10/19/2015 10:45 AM Medical Record Number: [id number] Patient Account Number: [id number] Date of Birth/Sex: [birth year] (60 y.o. Female) Treating RN: Montey Hora Primary Care Physician: [name] Other Clinician: Referring Physician: [name] Treating Physician/Extender: Frann Rider in Treatment: 42 Visit Information History Since Last Visit Added or deleted any medications: No Patient Arrived: Walker Any new allergies or adverse reactions: No Arrival Time: 11:26 Had a fall or experienced change in No Accompanied By: self activities of daily living that may affect Transfer Assistance: None risk of falls: Patient Identification Verified: Yes Signs or symptoms of abuse/neglect since last No Secondary Verification Process Yes visito Completed: Hospitalized since last visit: No Patient Requires Transmission- No Pain Present Now: No Based Precautions: Patient Has Alerts: Yes Patient Alerts: Patient on Blood Thinner Electronic Signature(s) Signed: 10/19/2015 5:22:46 PM By: Montey Hora Entered By: Montey Hora on 10/19/2015 11:26:48 [place], [name] ([id number]) -------------------------------------------------------------------------------- Clinic Level of Care Assessment Details [name]. Date of Service: 10/19/2015 10:45 AM Medical Record Number: [id number] Patient Account Number: [id number] Date of Birth/Sex: Jan 13, 1956 (60 y.o. Female) Treating RN: Montey Hora Primary Care Physician: [name] Other Clinician: Referring Physician: [name] Treating Physician/Extender: Frann Rider in Treatment: 42 Clinic Level of Care Assessment Items TOOL 4 Quantity Score []  - Use when only an EandM is performed on FOLLOW-UP visit 0 ASSESSMENTS - Nursing Assessment / Reassessment X - Reassessment of  Co-morbidities (includes updates in patient status) 1 10 X - Reassessment of Adherence to Treatment Plan 1 5 ASSESSMENTS - Wound and Skin Assessment / Reassessment X - Simple Wound Assessment / Reassessment - one wound 1 5 []  - Complex Wound Assessment / Reassessment - multiple wounds 0 []  - Dermatologic / Skin Assessment (not related to wound area) 0 ASSESSMENTS - Focused Assessment []  - Circumferential Edema Measurements - multi extremities 0 []  - Nutritional Assessment / Counseling / Intervention 0 []  - Lower Extremity Assessment (monofilament, tuning fork, pulses) 0 []  - Peripheral Arterial Disease Assessment (using hand held doppler) 0 ASSESSMENTS - Ostomy and/or Continence Assessment and Care []  - Incontinence Assessment and Management 0 []  - Ostomy Care Assessment and Management (repouching, etc.) 0 PROCESS - Coordination of Care X - Simple Patient / Family Education for ongoing care 1 15 []  - Complex (extensive) Patient / Family Education for ongoing care 0 []  - Staff obtains Programmer, systems, Records, Test Results / Process Orders 0 []  - Staff telephones HHA, Nursing Homes / Clarify orders / etc 0 []  - Routine Transfer to another Facility (non-emergent condition) 0 [name], [name] ([id number]) []  - Routine Hospital Admission (non-emergent condition) 0 []  - New Admissions / Biomedical engineer / Ordering NPWT, Apligraf, etc. 0 []  - Emergency Hospital Admission (emergent condition) 0 X - Simple Discharge Coordination 1 10 []  - Complex (extensive) Discharge Coordination 0 PROCESS - Special Needs []  - Pediatric / Minor Patient Management 0 []  - Isolation Patient Management 0 []  - Hearing / Language / Visual special needs 0 []  - Assessment of Community assistance (transportation, D/C planning, etc.) 0 []  - Additional assistance / Altered mentation 0 []  - Support Surface(s) Assessment (bed, cushion, seat, etc.) 0 INTERVENTIONS - Wound Cleansing / Measurement X - Simple Wound  Cleansing - one wound 1 5 []  - Complex Wound Cleansing - multiple wounds 0 X - Wound Imaging (photographs - any number of wounds) 1 5 []  - Wound  Tracing (instead of photographs) 0 X - Simple Wound Measurement - one wound 1 5 []  - Complex Wound Measurement - multiple wounds 0 INTERVENTIONS - Wound Dressings X - Small Wound Dressing one or multiple wounds 1 10 []  - Medium Wound Dressing one or multiple wounds 0 []  - Large Wound Dressing one or multiple wounds 0 []  - Application of Medications - topical 0 []  - Application of Medications - injection 0 INTERVENTIONS - Miscellaneous []  - External ear exam 0 [place], [name] ([id number]) []  - Specimen Collection (cultures, biopsies, blood, body fluids, etc.) 0 []  - Specimen(s) / Culture(s) sent or taken to Lab for analysis 0 []  - Patient Transfer (multiple staff / Harrel Lemon Lift / Similar devices) 0 []  - Simple Staple / Suture removal (25 or less) 0 []  - Complex Staple / Suture removal (26 or more) 0 []  - Hypo / Hyperglycemic Management (close monitor of Blood Glucose) 0 []  - Ankle / Brachial Index (ABI) - do not check if billed separately 0 X - Vital Signs 1 5 Has the patient been seen at the hospital within the last three years: Yes Total Score: 75 Level Of Care: New/Established - Level 2 Electronic Signature(s) Signed: 10/19/2015 5:22:46 PM By: Montey Hora Entered By: Montey Hora on 10/19/2015 12:02:31 [name], [name] ([id number]) -------------------------------------------------------------------------------- Encounter Discharge Information Details [name], [name] Date of Service: 10/19/2015 10:45 AM Medical Record Number: [id number] Patient Account Number: [id number] Date of Birth/Sex: 07/12/1956 (60 y.o. Female) Treating RN: Montey Hora Primary Care Physician: [name] Other Clinician: Referring Physician: [name] Treating Physician/Extender: Frann Rider in Treatment: 25 Encounter Discharge  Information Items Discharge Pain Level: 0 Discharge Condition: Stable Ambulatory Status: Walker Discharge Destination: Home Transportation: Private Auto Accompanied By: self Schedule Follow-up Appointment: Yes Medication Reconciliation completed No and provided to Patient/Care Artyom Stencel: Provided on Clinical Summary of Care: 10/19/2015 Form Type Recipient Paper Patient LD Electronic Signature(s) Signed: 10/19/2015 5:22:46 PM By: Montey Hora Previous Signature: 10/19/2015 11:48:19 AM Version By: Ruthine Dose Entered By: Montey Hora on 10/19/2015 12:03:41 [place], [name] ([id number]) -------------------------------------------------------------------------------- Multi Wound Chart Details [name], [name] Date of Service: 10/19/2015 10:45 AM Medical Record Number: [id number] Patient Account Number: [id number] Date of Birth/Sex: 01-31-1956 (60 y.o. Female) Treating RN: Montey Hora Primary Care Physician: [name] Other Clinician: Referring Physician: [name] Treating Physician/Extender: Frann Rider in Treatment: 42 Vital Signs Height(in): 65 Pulse(bpm): 90 Weight(lbs): 156 Blood Pressure 197/89 (mmHg): Body Mass Index(BMI): 26 Temperature(F): 98.1 Respiratory Rate 18 (breaths/min): Photos: [1:No Photos] [N/A:N/A] Wound Location: [1:Right Back] [N/A:N/A] Wounding Event: [1:Blister] [N/A:N/A] Primary Etiology: [1:Pressure Ulcer] [N/A:N/A] Comorbid History: [1:Glaucoma, Anemia, Coronary Artery Disease, Hypertension, Type II Diabetes, End Stage Renal Disease, Osteoarthritis, Neuropathy] [N/A:N/A] Date Acquired: [1:10/19/2013] [N/A:N/A] Weeks of Treatment: [1:42] [N/A:N/A] Wound Status: [1:Open] [N/A:N/A] Measurements L x W x D 2.5x2.5x0.1 [N/A:N/A] (cm) Area (cm) : [1:4.909] [N/A:N/A] Volume (cm) : [1:0.491] [N/A:N/A] % Reduction in Area: [1:87.20%] [N/A:N/A] % Reduction in Volume: 87.20% [N/A:N/A] Classification: [1:Category/Stage  II] [N/A:N/A] Exudate Amount: [1:Small] [N/A:N/A] Exudate Type: [1:Sanguinous] [N/A:N/A] Exudate Color: [1:red] [N/A:N/A] Wound Margin: [1:Indistinct, nonvisible] [N/A:N/A] Granulation Amount: [1:Large (67-100%)] [N/A:N/A] Granulation Quality: [1:Red, Hyper-granulation] [N/A:N/A] Necrotic Amount: [1:None Present (0%)] [N/A:N/A] Exposed Structures: [1:Fascia: No Fat: No Tendon: No] [N/A:N/A] Muscle: No Joint: No Bone: No Limited to Skin Breakdown Epithelialization: Medium (34-66%) N/A N/A Periwound Skin Texture: Edema: No N/A N/A Excoriation: No Induration: No Callus: No Crepitus: No Fluctuance: No Friable: No Rash: No Scarring: No Periwound Skin  Moist: Yes N/A N/A Moisture: Maceration: No Dry/Scaly: No Periwound Skin Color: Atrophie Blanche: No N/A N/A Cyanosis: No Ecchymosis: No Erythema: No Hemosiderin Staining: No Mottled: No Pallor: No Rubor: No Temperature: No Abnormality N/A N/A Tenderness on No N/A N/A Palpation: Wound Preparation: Ulcer Cleansing: N/A N/A Rinsed/Irrigated with Saline Topical Anesthetic Applied: None Treatment Notes Electronic Signature(s) Signed: 10/19/2015 5:22:46 PM By: Montey Hora Entered By: Montey Hora on 10/19/2015 12:01:59 [name], [name] ([id number]) -------------------------------------------------------------------------------- [place] Details [name]. Date of Service: 10/19/2015 10:45 AM Medical Record Number: [id number] Patient Account Number: [id number] Date of Birth/Sex: [birth year] (60 y.o. Female) Treating RN: Montey Hora Primary Care Physician: [name] Other Clinician: Referring Physician: [name] Treating Physician/Extender: Frann Rider in Treatment: 19 Active Inactive Abuse / Safety / Falls / Self Care Management Nursing Diagnoses: Potential for falls Goals: Patient will remain injury free Date Initiated: 12/23/2014 Goal Status:  Active Patient/caregiver will verbalize understanding of skin care regimen Date Initiated: 12/23/2014 Goal Status: Active Patient/caregiver will verbalize/demonstrate measures taken to prevent injury and/or falls Date Initiated: 12/23/2014 Goal Status: Active Patient/caregiver will verbalize/demonstrate understanding of what to do in case of emergency Date Initiated: 12/23/2014 Goal Status: Active Interventions: Assess fall risk on admission and as needed Assess: immobility, friction, shearing, incontinence upon admission and as needed Assess impairment of mobility on admission and as needed per policy Assess self care needs on admission and as needed Provide education on fall prevention Notes: Wound/Skin Impairment Nursing Diagnoses: Impaired tissue integrity Knowledge deficit related to ulceration/compromised skin integrity Goals: Patient/caregiver will verbalize understanding of skin care regimen [name], [name] ([id number]) Date Initiated: 12/23/2014 Goal Status: Active Ulcer/skin breakdown will heal within 14 weeks Date Initiated: 12/23/2014 Goal Status: Active Interventions: Assess patient/caregiver ability to obtain necessary supplies Assess patient/caregiver ability to perform ulcer/skin care regimen upon admission and as needed Assess ulceration(s) every visit Provide education on ulcer and skin care Treatment Activities: Skin care regimen initiated : 10/19/2015 Topical wound management initiated : 10/19/2015 Notes: Electronic Signature(s) Signed: 10/19/2015 5:22:46 PM By: Montey Hora Entered By: Montey Hora on 10/19/2015 11:59:00 [place], [name] ([id number]) -------------------------------------------------------------------------------- Patient/Caregiver Education Details [name]. Date of Service: 10/19/2015 10:45 AM Medical Record Number: [id number] Patient Account Number: [id number] Date of Birth/Gender: 15-Dec-1955 (60 y.o. Female) Treating RN:  Montey Hora Primary Care Physician: [name] Other Clinician: Referring Physician: [name] Treating Physician/Extender: Frann Rider in Treatment: 72 Education Assessment Education Provided To: Patient Education Topics Provided Wound/Skin Impairment: Handouts: Other: wound care as ordered Methods: Explain/Verbal Responses: State content correctly Electronic Signature(s) Signed: 10/19/2015 5:22:46 PM By: Montey Hora Entered By: Montey Hora on 10/19/2015 12:04:25 [place], [name] ([id number]) -------------------------------------------------------------------------------- Wound Assessment Details [name], [name] Date of Service: 10/19/2015 10:45 AM Medical Record Number: [id number] Patient Account Number: [id number] Date of Birth/Sex: Jan 01, 1956 (60 y.o. Female) Treating RN: Montey Hora Primary Care Physician: [name] Other Clinician: Referring Physician: [name] Treating Physician/Extender: Frann Rider in Treatment: 42 Wound Status Wound Number: 1 Primary Pressure Ulcer Etiology: Wound Location: Right Back Wound Open Wounding Event: Blister Status: Date Acquired: 10/19/2013 Comorbid Glaucoma, Anemia, Coronary Artery Weeks Of Treatment: 42 History: Disease, Hypertension, Type II Clustered Wound: No Diabetes, End Stage Renal Disease, Osteoarthritis, Neuropathy Photos Photo Uploaded By: Montey Hora on 10/19/2015 15:19:49 Wound Measurements Length: (cm) 2.5 Width: (cm) 2.5 Depth: (cm) 0.1 Area: (cm) 4.909 Volume: (cm) 0.491 % Reduction in Area: 87.2% % Reduction in Volume: 87.2% Epithelialization:  Medium (34-66%) Tunneling: No Undermining: No Wound Description Classification: Category/Stage II Wound Margin: Indistinct, nonvisible Exudate Amount: Small Exudate Type: Sanguinous Exudate Color: red Foul Odor After Cleansing: No Wound Bed Granulation Amount: Large (67-100%) Exposed  Structure Granulation Quality: Red, Hyper-granulation Fascia Exposed: No Necrotic Amount: None Present (0%) Fat Layer Exposed: No [name], [name] ([id number]) Tendon Exposed: No Muscle Exposed: No Joint Exposed: No Bone Exposed: No Limited to Skin Breakdown Periwound Skin Texture Texture Color No Abnormalities Noted: No No Abnormalities Noted: No Callus: No Atrophie Blanche: No Crepitus: No Cyanosis: No Excoriation: No Ecchymosis: No Fluctuance: No Erythema: No Friable: No Hemosiderin Staining: No Induration: No Mottled: No Localized Edema: No Pallor: No Rash: No Rubor: No Scarring: No Temperature / Pain Moisture Temperature: No Abnormality No Abnormalities Noted: No Dry / Scaly: No Maceration: No Moist: Yes Wound Preparation Ulcer Cleansing: Rinsed/Irrigated with Saline Topical Anesthetic Applied: None Treatment Notes Wound #1 (Right Back) 1. Cleansed with: Clean wound with Normal Saline 3. Peri-wound Care: Skin Prep 4. Dressing Applied: Other dressing (specify in notes) Notes cutimed siltec sorbact Electronic Signature(s) Signed: 10/19/2015 5:22:46 PM By: Montey Hora Entered By: Montey Hora on 10/19/2015 12:01:28 [place], [name] ([id number]) -------------------------------------------------------------------------------- Vitals Details [name]. Date of Service: 10/19/2015 10:45 AM Medical Record Number: [id number] Patient Account Number: [id number] Date of Birth/Sex: [birth year] (60 y.o. Female) Treating RN: Montey Hora Primary Care Physician: [name] Other Clinician: Referring Physician: [name] Treating Physician/Extender: Frann Rider in Treatment: 42 Vital Signs Time Taken: 11:28 Temperature (F): 98.1 Height (in): 65 Pulse (bpm): 90 Weight (lbs): 156 Respiratory Rate (breaths/min): 18 Body Mass Index (BMI): 26 Blood Pressure (mmHg): 197/89 Reference Range: 80 - 120 mg / dl Electronic  Signature(s) Signed: 10/19/2015 5:22:46 PM By: Montey Hora Entered By: Montey Hora on 10/19/2015 11:28:43

## 2015-11-02 ENCOUNTER — Ambulatory Visit: Payer: Medicaid Other | Admitting: General Surgery

## 2015-11-05 ENCOUNTER — Encounter: Payer: Medicare Other | Admitting: Surgery

## 2015-11-05 DIAGNOSIS — E11622 Type 2 diabetes mellitus with other skin ulcer: Secondary | ICD-10-CM | POA: Diagnosis not present

## 2015-11-06 NOTE — Progress Notes (Signed)
[name] ([id number]) Visit Report for 11/05/2015 Chief Complaint Document Details [name], [name]. Date of Service: 11/05/2015 3:45 PM Medical Record Patient Account Number: [id number] Number: [name], Treating RN: 09-16-1955 (60 y.o. [name] Date of Birth/Sex: Female) Other Clinician: Primary Care Physician: Brynda [name], [name] Treating [name] Referring Physician: Nicky [name] Physician/Extender: [name]: 59 Information Obtained from: Patient Chief Complaint Patient presents to the wound care center for a consult due non healing wound. She has an open wound on her right upper back which she's had for about a year and she recently noticed a blister on her right lower extremity about 2 weeks ago. Electronic Signature(s) Signed: 11/05/2015 4:13:03 PM By: [name] Fudge MD, [name] Entered By: [name] on 11/05/2015 16:13:03 [name] ([id number]) -------------------------------------------------------------------------------- HPI Details [name], [name]. Date of Service: 11/05/2015 3:45 PM Medical Record Patient Account Number: [id number] Number: [name], Treating RN: 1955/09/10 (60 y.o. [name] Date of Birth/Sex: Female) Other Clinician: Primary Care Physician: Brynda [name], [name] Treating [name] Referring Physician: Nicky [name] Physician/Extender: [name]: 25 History of Present Illness Location: right upper back and right lower extremity wounds Quality: Patient reports No Pain. Severity: Patient states wound (s) are getting better. Duration: Patient has had the wound for > 3 months prior to seeking treatment at the wound center Timing: she thought it first occurred when she was using a heating pad about a year ago after back surgery. Context: The wound appeared gradually over time Modifying Factors: Patient is currently on renal dialysis and receives treatments 3 times  weekly Associated Signs and Symptoms: Patient reports having: surgery scheduled for this week for a AV fistula left arm. HPI Description: 60 year old patient who is known to be a diabetic and has end-stage renal disease has had several comorbidities including coronary artery disease, hypertension, hyperlipidemia, pancreatitis, anemia, previous history of hysterectomy, cholecystectomy, left-sided salivary gland excision, bilateral cataract surgery,Peritoneal dialysis catheter, hemodialysis catheter. the area on the back has also been caused by instant pressure she used to sleep on a recliner all day and has significant kyphoscoliosis. As far as the wound on her right lower extremity she's not sure how this blister occurred but she thought it has been there for about 2 weeks. No recent blood investigations available and no recent hemoglobin A1c. 12/30/2014 -- she is an assisted living facility but I believe the nurses that have not followed instructions as she had some cream applied on her back and there was a different dressing. Last week she's had a AV fistula placed on her left forearm. 01/13/2015 -- she has had some localized infection at the port site and she's been on doxycycline for this. 02/05/2015 - he has developed a small blister on her right lower extremity. 02/10/2015 -- she has developed another small blister on her right anterior chest wall in the area where she's had tape for her dialysis access. this may just be injury caused by a tape burn. 02/18/2015 -- no new blisters and she had a dermatology opinion and they have taken a biopsy of her skin. She also had a left brachial AV fistula placed this week. 03/03/2015 -- though we do not have the pathology report yet the patient says she has been put on prednisone because this skin disease is possibly to do with her immune system and her dermatologist is recommended this. 03/10/2015 -- she has developed a new blister which is quite  large on her right lower  extremity on the shin. 05/22/2015 -- she did very well with her dressing but on trying to remove the [place] it peels away [name], [name] ([id number]) the newly healed skin. 06/05/2015 -- the patient was in the ER this past week for severe bleeding from the right nostril and had to have ENT see her and do a packing. They've also been holding her heparin during her dialysis. The patient is going back to ENT today. Other than that she has had no other significant issues. 06/19/2015 -- she is back on her blood thinners and continues to have her hemodialysis as before. 07/31/2015 -- a large blister has popped up again on her right lower extremity in the same place in the mid shin area in the anterior lateral compartment. 10/05/2015 -- last week she was admitted to the hospital for 4 days as she had a pneumonia and was treated with IV and oral antibiotics. Electronic Signature(s) Signed: 11/05/2015 4:13:07 PM By: [name] Fudge MD, [name] Entered By: [name] on 11/05/2015 16:13:07 [name] ([id number]) -------------------------------------------------------------------------------- Physical Exam Details [name], [name]. Date of Service: 11/05/2015 3:45 PM Medical Record Patient Account Number: [id number] Number: [name], Treating RN: [birth year] (60 y.o. [name] Date of Birth/Sex: Female) Other Clinician: Primary Care Physician: Brynda [name], [name] Treating [name] Referring Physician: Nicky [name] Physician/Extender: [name]: 45 Constitutional . Pulse regular. Respirations normal and unlabored. Afebrile. . Eyes Nonicteric. Reactive to light. Ears, Nose, Mouth, and Throat Lips, teeth, and gums WNL.Marland Kitchen Moist mucosa without lesions. Neck supple and nontender. No palpable supraclavicular or cervical adenopathy. Normal sized without goiter. Respiratory WNL. No retractions.. Breath sounds WNL, No rubs, rales,  rhonchi, or wheeze.. Cardiovascular Heart rhythm and rate regular, no murmur or gallop.. Pedal Pulses WNL. No clubbing, cyanosis or edema. Lymphatic No adneopathy. No adenopathy. No adenopathy. Musculoskeletal Adexa without tenderness or enlargement.. Digits and nails w/o clubbing, cyanosis, infection, petechiae, ischemia, or inflammatory conditions.. Integumentary (Hair, Skin) No suspicious lesions. No crepitus or fluctuance. No peri-wound warmth or erythema. No masses.Marland Kitchen Psychiatric Judgement and insight Intact.. No evidence of depression, anxiety, or agitation.. Notes the wound on the upper back is looking good but has a few open areas which are healing well otherwise. There is no evidence of cellulitis. Electronic Signature(s) Signed: 11/05/2015 4:13:37 PM By: [name] Fudge MD, [name] Entered By: [name] on 11/05/2015 16:13:36 [name], [name] ([id number]) -------------------------------------------------------------------------------- Physician Orders Details [name]. Date of Service: 11/05/2015 3:45 PM Medical Record Patient Account Number: [id number] Number: [name], Treating RN: 01-06-1956 (60 y.o. [name] Date of Birth/Sex: Female) Other Clinician: Primary Care Physician: Brynda [name], [name] Treating Heitor Steinhoff Referring Physician: Nicky [name] Physician/Extender: [place]ella [place] in Treatment: 41 Verbal / Phone Orders: Yes Clinician: [name], RN, [name], [name] Read Back and Verified: Yes Diagnosis Coding Wound Cleansing Wound #1 Right Back o Clean wound with Normal Saline. Skin Barriers/Peri-Wound Care Wound #1 Right Back o Skin Prep Primary Wound Dressing Wound #1 Right Back o Other: - Siltec Sorbact - SNF RN may change dressing if needed as drainage pass white border. Extra piece given to patient. Dressing Change Frequency Wound #1 Right Back o Change dressing every week - unless saturated. Gently pull of old dressing due to patient  thin epetheilized skin and may wet with saline if needed. SNF RN to change dressing next week Follow-up Appointments Wound #1 Right Back o Return Appointment in 2 weeks. - SNF RN to change dressing next week Electronic  Signature(s) Signed: 11/05/2015 4:14:45 PM By: [name] Fudge MD, [name] Signed: 11/05/2015 4:25:47 PM By: Regan Lemming [name], RN Entered By: Regan Lemming on 11/05/2015 15:42:15 [name], [name] ([id number]) -------------------------------------------------------------------------------- Problem List Details [name], [name]. Date of Service: 11/05/2015 3:45 PM Medical Record Patient Account Number: [id number] Number: [name], Treating RN: Dec 13, 1955 (60 y.o. [name] Date of Birth/Sex: Female) Other Clinician: Primary Care Physician: [name] Treating [name] Referring Physician: Nicky [name] Physician/Extender: [name]: 52 Active Problems ICD-10 Encounter Code Description Active Date Diagnosis E11.622 Type 2 diabetes mellitus with other skin ulcer 12/23/2014 Yes L89.112 Pressure ulcer of right upper back, stage 2 12/23/2014 Yes S80.821A Blister (nonthermal), right lower leg, initial encounter 12/23/2014 Yes N18.6 End stage renal disease 12/23/2014 Yes Inactive Problems Resolved Problems Electronic Signature(s) Signed: 11/05/2015 4:12:54 PM By: [name] Fudge MD, [name] Entered By: [name] on 11/05/2015 16:12:54 Idler, [name] ([id number]) -------------------------------------------------------------------------------- Progress Note Details [name], [name] Date of Service: 11/05/2015 3:45 PM Medical Record Patient Account Number: [id number] Number: [name], Treating RN: May 12, 1956 (61 y.o. [name] Date of Birth/Sex: Female) Other Clinician: Primary Care Physician: Brynda [name], [name] Treating Venera Privott Referring Physician: Nicky [name] Physician/Extender: [name]:  57 Subjective Chief Complaint Information obtained from Patient Patient presents to the wound care center for a consult due non healing wound. She has an open wound on her right upper back which she's had for about a year and she recently noticed a blister on her right lower extremity about 2 weeks ago. History of Present Illness (HPI) The following HPI elements were documented for the patient's wound: Location: right upper back and right lower extremity wounds Quality: Patient reports No Pain. Severity: Patient states wound (s) are getting better. Duration: Patient has had the wound for > 3 months prior to seeking treatment at the wound center Timing: she thought it first occurred when she was using a heating pad about a year ago after back surgery. Context: The wound appeared gradually over time Modifying Factors: Patient is currently on renal dialysis and receives treatments 3 times weekly Associated Signs and Symptoms: Patient reports having: surgery scheduled for this week for a AV fistula left arm. 60 year old patient who is known to be a diabetic and has end-stage renal disease has had several comorbidities including coronary artery disease, hypertension, hyperlipidemia, pancreatitis, anemia, previous history of hysterectomy, cholecystectomy, left-sided salivary gland excision, bilateral cataract surgery,Peritoneal dialysis catheter, hemodialysis catheter. the area on the back has also been caused by instant pressure she used to sleep on a recliner all day and has significant kyphoscoliosis. As far as the wound on her right lower extremity she's not sure how this blister occurred but she thought it has been there for about 2 weeks. No recent blood investigations available and no recent hemoglobin A1c. 12/30/2014 -- she is an assisted living facility but I believe the nurses that have not followed instructions as she had some cream applied on her back and there was a different  dressing. Last week she's had a AV fistula placed on her left forearm. 01/13/2015 -- she has had some localized infection at the port site and she's been on doxycycline for this. 02/05/2015 - he has developed a small blister on her right lower extremity. 02/10/2015 -- she has developed another small blister on her right anterior chest wall in the area where she's had tape for her dialysis access. this may just be injury caused by a tape burn.  [name], [name] ([id number]) 02/18/2015 -- no new blisters and she had a dermatology opinion and they have taken a biopsy of her skin. She also had a left brachial AV fistula placed this week. 03/03/2015 -- though we do not have the pathology report yet the patient says she has been put on prednisone because this skin disease is possibly to do with her immune system and her dermatologist is recommended this. 03/10/2015 -- she has developed a new blister which is quite large on her right lower extremity on the shin. 05/22/2015 -- she did very well with her dressing but on trying to remove the [place] it peels away the newly healed skin. 06/05/2015 -- the patient was in the ER this past week for severe bleeding from the right nostril and had to have ENT see her and do a packing. They've also been holding her heparin during her dialysis. The patient is going back to ENT today. Other than that she has had no other significant issues. 06/19/2015 -- she is back on her blood thinners and continues to have her hemodialysis as before. 07/31/2015 -- a large blister has popped up again on her right lower extremity in the same place in the mid shin area in the anterior lateral compartment. 10/05/2015 -- last week she was admitted to the hospital for 4 days as she had a pneumonia and was treated with IV and oral antibiotics. Objective Constitutional Pulse regular. Respirations normal and unlabored. Afebrile. Vitals Time Taken: 3:33 PM, Height: 65 in, Weight:  156 lbs, BMI: 26, Temperature: 98.2 F, Pulse: 80 bpm, Respiratory Rate: 18 breaths/min, Blood Pressure: 148/57 mmHg. Eyes Nonicteric. Reactive to light. Ears, Nose, Mouth, and Throat Lips, teeth, and gums WNL.Marland Kitchen Moist mucosa without lesions. Neck supple and nontender. No palpable supraclavicular or cervical adenopathy. Normal sized without goiter. Respiratory WNL. No retractions.. Breath sounds WNL, No rubs, rales, rhonchi, or wheeze.. Cardiovascular Heart rhythm and rate regular, no murmur or gallop.. Pedal Pulses WNL. No clubbing, cyanosis or edema. [name], [name] ([id number]) Lymphatic No adneopathy. No adenopathy. No adenopathy. Musculoskeletal Adexa without tenderness or enlargement.. Digits and nails w/o clubbing, cyanosis, infection, petechiae, ischemia, or inflammatory conditions.Marland Kitchen Psychiatric Judgement and insight Intact.. No evidence of depression, anxiety, or agitation.. General Notes: the wound on the upper back is looking good but has a few open areas which are healing well otherwise. There is no evidence of cellulitis. Integumentary (Hair, Skin) No suspicious lesions. No crepitus or fluctuance. No peri-wound warmth or erythema. No masses.. Wound #1 status is Open. Original cause of wound was Blister. The wound is located on the Right Back. The wound measures 2cm length x 2cm width x 0.1cm depth; 3.142cm^2 area and 0.314cm^3 volume. The wound is limited to skin breakdown. There is no tunneling or undermining noted. There is a small amount of sanguinous drainage noted. The wound margin is indistinct and nonvisible. There is large (67-100%) red granulation within the wound bed. There is no necrotic tissue within the wound bed. The periwound skin appearance exhibited: Moist. The periwound skin appearance did not exhibit: Callus, Crepitus, Excoriation, Fluctuance, Friable, Induration, Localized Edema, Rash, Scarring, Dry/Scaly, Maceration, Atrophie Blanche, Cyanosis,  Ecchymosis, Hemosiderin Staining, Mottled, Pallor, Rubor, Erythema. Periwound temperature was noted as No Abnormality. Assessment Active Problems ICD-10 E11.622 - Type 2 diabetes mellitus with other skin ulcer L89.112 - Pressure ulcer of right upper back, stage 2 S80.821A - Blister (nonthermal), right lower leg, initial encounter N18.6 - End stage renal disease Plan Wound Cleansing:  Wound #1 Right Back: Clean wound with Normal Saline. [name], [name] ([id number]) Skin Barriers/Peri-Wound Care: Wound #1 Right Back: Skin Prep Primary Wound Dressing: Wound #1 Right Back: Other: - Siltec Sorbact - SNF RN may change dressing if needed as drainage pass white border. Extra piece given to patient. Dressing Change Frequency: Wound #1 Right Back: Change dressing every week - unless saturated. Gently pull of old dressing due to patient thin epetheilized skin and may wet with saline if needed. SNF RN to change dressing next week Follow-up Appointments: Wound #1 Right Back: Return Appointment in 2 weeks. - SNF RN to change dressing next week We will try and continue with this same [place] through the week and if she remains healed next week we will discharge her from our services. Electronic Signature(s) Signed: 11/05/2015 4:14:07 PM By: [name] Fudge MD, [name] Entered By: [name] on 11/05/2015 16:14:07 [name], [name] ([id number]) -------------------------------------------------------------------------------- SuperBill Details [name]. Date of Service: 11/05/2015 Medical Record Patient Account Number: [id number] Number: [name], Treating RN: [birth year] [phone] y.o. [name] Date of Birth/Sex: Female) Other Clinician: Primary Care Physician: [name] Treating Analese Sovine Referring Physician: Nicky [name] Physician/Extender: [name]: 45 Diagnosis Coding ICD-10 Codes Code Description E11.622 Type 2 diabetes mellitus with  other skin ulcer L89.112 Pressure ulcer of right upper back, stage 2 S80.821A Blister (nonthermal), right lower leg, initial encounter N18.6 End stage renal disease Facility Procedures CPT4 Code: [id number] Description: [id number] - WOUND CARE VISIT-LEV 2 EST PT Modifier: Quantity: 1 Physician Procedures CPT4 Code: [id number] Description: [id number] - WC PHYS LEVEL 3 - EST PT ICD-10 Description Diagnosis E11.622 Type 2 diabetes mellitus with other skin ulcer L89.112 Pressure ulcer of right upper back, stage 2 S80.821A Blister (nonthermal), right lower leg, initial enc N18.6 End  stage renal disease Modifier: ounter Quantity: 1 Electronic Signature(s) Signed: 11/05/2015 4:14:26 PM By: [name] Fudge MD, [name] Entered By: [name] on 11/05/2015 16:14:26

## 2015-11-06 NOTE — Progress Notes (Signed)
GHIA, SUTO ([id number]) Visit Report for 11/05/2015 Arrival Information Details [name], [name]. Date of Service: 11/05/2015 3:45 PM Medical Record Number: [id number] Patient Account Number: [id number] Date of Birth/Sex: Oct 15, 1955 (60 y.o. Female) Treating RN: [name], RN, [name], [name] Primary Care Physician: [name] Other Clinician: Referring Physician: [name] Treating Physician/Extender: Frann Rider in Treatment: 36 Visit Information History Since Last Visit Added or deleted any medications: No Patient Arrived: Walker Any new allergies or adverse reactions: No Arrival Time: 15:32 Had a fall or experienced change in No Accompanied By: self activities of daily living that may affect Transfer Assistance: None risk of falls: Patient Identification Verified: Yes Signs or symptoms of abuse/neglect since last No Secondary Verification Process Yes visito Completed: Has Dressing in Place as Prescribed: Yes Patient Requires Transmission- No Pain Present Now: No Based Precautions: Patient Has Alerts: Yes Patient Alerts: Patient on Blood Thinner Electronic Signature(s) Signed: 11/05/2015 4:25:47 PM By: Regan Lemming [name], RN Entered By: Regan Lemming on 11/05/2015 15:32:45 Berkel, [name] ([id number]) -------------------------------------------------------------------------------- Clinic Level of Care Assessment Details [name]. Date of Service: 11/05/2015 3:45 PM Medical Record Number: [id number] Patient Account Number: [id number] Date of Birth/Sex: 07-28-1955 (61 y.o. Female) Treating RN: [name], RN, [name], [name] Primary Care Physician: [name] Other Clinician: Referring Physician: [name] Treating Physician/Extender: Frann Rider in Treatment: 35 Clinic Level of Care Assessment Items TOOL 4 Quantity Score []  - Use when only an EandM is performed on FOLLOW-UP visit 0 ASSESSMENTS - Nursing Assessment /  Reassessment X - Reassessment of Co-morbidities (includes updates in patient status) 1 10 X - Reassessment of Adherence to Treatment Plan 1 5 ASSESSMENTS - Wound and Skin Assessment / Reassessment X - Simple Wound Assessment / Reassessment - one wound 1 5 []  - Complex Wound Assessment / Reassessment - multiple wounds 0 []  - Dermatologic / Skin Assessment (not related to wound area) 0 ASSESSMENTS - Focused Assessment []  - Circumferential Edema Measurements - multi extremities 0 []  - Nutritional Assessment / Counseling / Intervention 0 []  - Lower Extremity Assessment (monofilament, tuning fork, pulses) 0 []  - Peripheral Arterial Disease Assessment (using hand held doppler) 0 ASSESSMENTS - Ostomy and/or Continence Assessment and Care []  - Incontinence Assessment and Management 0 []  - Ostomy Care Assessment and Management (repouching, etc.) 0 PROCESS - Coordination of Care X - Simple Patient / Family Education for ongoing care 1 15 []  - Complex (extensive) Patient / Family Education for ongoing care 0 []  - Staff obtains Programmer, systems, Records, Test Results / Process Orders 0 []  - Staff telephones HHA, Nursing Homes / Clarify orders / etc 0 []  - Routine Transfer to another Facility (non-emergent condition) 0 Billiot, Whitleigh S. ([id number]) []  - Routine Hospital Admission (non-emergent condition) 0 []  - New Admissions / Biomedical engineer / Ordering NPWT, Apligraf, etc. 0 []  - Emergency Hospital Admission (emergent condition) 0 []  - Simple Discharge Coordination 0 []  - Complex (extensive) Discharge Coordination 0 PROCESS - Special Needs []  - Pediatric / Minor Patient Management 0 []  - Isolation Patient Management 0 []  - Hearing / Language / Visual special needs 0 []  - Assessment of Community assistance (transportation, D/C planning, etc.) 0 []  - Additional assistance / Altered mentation 0 []  - Support Surface(s) Assessment (bed, cushion, seat, etc.) 0 INTERVENTIONS - Wound Cleansing /  Measurement X - Simple Wound Cleansing - one wound 1 5 []  - Complex Wound Cleansing - multiple wounds 0 X - Wound Imaging (photographs - any number  of wounds) 1 5 []  - Wound Tracing (instead of photographs) 0 X - Simple Wound Measurement - one wound 1 5 []  - Complex Wound Measurement - multiple wounds 0 INTERVENTIONS - Wound Dressings X - Small Wound Dressing one or multiple wounds 1 10 []  - Medium Wound Dressing one or multiple wounds 0 []  - Large Wound Dressing one or multiple wounds 0 []  - Application of Medications - topical 0 []  - Application of Medications - injection 0 INTERVENTIONS - Miscellaneous []  - External ear exam 0 Dancer, Giana S. ([id number]) []  - Specimen Collection (cultures, biopsies, blood, body fluids, etc.) 0 []  - Specimen(s) / Culture(s) sent or taken to Lab for analysis 0 []  - Patient Transfer (multiple staff / Harrel Lemon Lift / Similar devices) 0 []  - Simple Staple / Suture removal (25 or less) 0 []  - Complex Staple / Suture removal (26 or more) 0 []  - Hypo / Hyperglycemic Management (close monitor of Blood Glucose) 0 []  - Ankle / Brachial Index (ABI) - do not check if billed separately 0 X - Vital Signs 1 5 Has the patient been seen at the hospital within the last three years: Yes Total Score: 65 Level Of Care: New/Established - Level 2 Electronic Signature(s) Signed: 11/05/2015 4:25:47 PM By: Regan Lemming [name], RN Entered By: Regan Lemming on 11/05/2015 15:42:37 Spiewak, [name] ([id number]) -------------------------------------------------------------------------------- Encounter Discharge Information Details [name]. Date of Service: 11/05/2015 3:45 PM Medical Record Number: [id number] Patient Account Number: [id number] Date of Birth/Sex: Jul 04, 1956 (60 y.o. Female) Treating RN: Baruch Gouty, RN, [name], [name] Primary Care Physician: [name] Other Clinician: Referring Physician: [name] Treating Physician/Extender: Frann Rider in  Treatment: 72 Encounter Discharge Information Items Discharge Pain Level: 0 Discharge Condition: Stable Ambulatory Status: Walker Discharge Destination: Home Transportation: Other Accompanied By: self Schedule Follow-up Appointment: No Medication Reconciliation completed No and provided to Patient/Care Hawkins Seaman: Provided on Clinical Summary of Care: 11/05/2015 Form Type Recipient Paper Patient LD Electronic Signature(s) Signed: 11/05/2015 4:25:47 PM By: Regan Lemming [name], RN Previous Signature: 11/05/2015 3:42:46 PM Version By: Ruthine Dose Entered By: Regan Lemming on 11/05/2015 15:43:34 Derryberry, [name] ([id number]) -------------------------------------------------------------------------------- Multi Wound Chart Details [name]. Date of Service: 11/05/2015 3:45 PM Medical Record Number: [id number] Patient Account Number: [id number] Date of Birth/Sex: 11-07-1955 (60 y.o. Female) Treating RN: Baruch Gouty, RN, [name], [name] Primary Care Physician: [name] Other Clinician: Referring Physician: [name] Treating Physician/Extender: Frann Rider in Treatment: 45 Vital Signs Height(in): 65 Pulse(bpm): 80 Weight(lbs): 156 Blood Pressure 148/57 (mmHg): Body Mass Index(BMI): 26 Temperature(F): 98.2 Respiratory Rate 18 (breaths/min): Photos: [1:No Photos] [N/A:N/A] Wound Location: [1:Right Back] [N/A:N/A] Wounding Event: [1:Blister] [N/A:N/A] Primary Etiology: [1:Pressure Ulcer] [N/A:N/A] Comorbid History: [1:Glaucoma, Anemia, Coronary Artery Disease, Hypertension, Type II Diabetes, End Stage Renal Disease, Osteoarthritis, Neuropathy] [N/A:N/A] Date Acquired: [1:10/19/2013] [N/A:N/A] Weeks of Treatment: [1:45] [N/A:N/A] Wound Status: [1:Open] [N/A:N/A] Measurements L x W x D 2x2x0.1 [N/A:N/A] (cm) Area (cm) : [1:3.142] [N/A:N/A] Volume (cm) : [1:0.314] [N/A:N/A] % Reduction in Area: [1:91.80%] [N/A:N/A] % Reduction in Volume: 91.80%  [N/A:N/A] Classification: [1:Category/Stage II] [N/A:N/A] Exudate Amount: [1:Small] [N/A:N/A] Exudate Type: [1:Sanguinous] [N/A:N/A] Exudate Color: [1:red] [N/A:N/A] Wound Margin: [1:Indistinct, nonvisible] [N/A:N/A] Granulation Amount: [1:Large (67-100%)] [N/A:N/A] Granulation Quality: [1:Red, Hyper-granulation] [N/A:N/A] Necrotic Amount: [1:None Present (0%)] [N/A:N/A] Exposed Structures: [1:Fascia: No Fat: No Tendon: No] [N/A:N/A] Muscle: No Joint: No Bone: No Limited to Skin Breakdown Epithelialization: Medium (34-66%) N/A N/A Periwound Skin Texture: Edema: No N/A N/A Excoriation: No Induration: No  Callus: No Crepitus: No Fluctuance: No Friable: No Rash: No Scarring: No Periwound Skin Moist: Yes N/A N/A Moisture: Maceration: No Dry/Scaly: No Periwound Skin Color: Atrophie Blanche: No N/A N/A Cyanosis: No Ecchymosis: No Erythema: No Hemosiderin Staining: No Mottled: No Pallor: No Rubor: No Temperature: No Abnormality N/A N/A Tenderness on No N/A N/A Palpation: Wound Preparation: Ulcer Cleansing: N/A N/A Rinsed/Irrigated with Saline Topical Anesthetic Applied: None Treatment Notes Electronic Signature(s) Signed: 11/05/2015 4:25:47 PM By: Regan Lemming [name], RN Entered By: Regan Lemming on 11/05/2015 15:41:38 Kretzschmar, [name] ([id number]) -------------------------------------------------------------------------------- [place] Details [name]. Date of Service: 11/05/2015 3:45 PM Medical Record Number: [id number] Patient Account Number: [id number] Date of Birth/Sex: 03/24/1956 (60 y.o. Female) Treating RN: [name], RN, [name], [name] Primary Care Physician: [name] Other Clinician: Referring Physician: [name] Treating Physician/Extender: Frann Rider in Treatment: 21 Active Inactive Abuse / Safety / Falls / Self Care Management Nursing Diagnoses: Potential for falls Goals: Patient will remain injury  free Date Initiated: 12/23/2014 Goal Status: Active Patient/caregiver will verbalize understanding of skin care regimen Date Initiated: 12/23/2014 Goal Status: Active Patient/caregiver will verbalize/demonstrate measures taken to prevent injury and/or falls Date Initiated: 12/23/2014 Goal Status: Active Patient/caregiver will verbalize/demonstrate understanding of what to do in case of emergency Date Initiated: 12/23/2014 Goal Status: Active Interventions: Assess fall risk on admission and as needed Assess: immobility, friction, shearing, incontinence upon admission and as needed Assess impairment of mobility on admission and as needed per policy Assess self care needs on admission and as needed Provide education on fall prevention Notes: Wound/Skin Impairment Nursing Diagnoses: Impaired tissue integrity Knowledge deficit related to ulceration/compromised skin integrity Goals: Patient/caregiver will verbalize understanding of skin care regimen LEESA, DELASHMIT ([id number]) Date Initiated: 12/23/2014 Goal Status: Active Ulcer/skin breakdown will heal within 14 weeks Date Initiated: 12/23/2014 Goal Status: Active Interventions: Assess patient/caregiver ability to obtain necessary supplies Assess patient/caregiver ability to perform ulcer/skin care regimen upon admission and as needed Assess ulceration(s) every visit Provide education on ulcer and skin care Treatment Activities: Skin care regimen initiated : 11/05/2015 Topical wound management initiated : 11/05/2015 Notes: Electronic Signature(s) Signed: 11/05/2015 4:25:47 PM By: Regan Lemming [name], RN Entered By: Regan Lemming on 11/05/2015 15:41:28 Ardelean, [name] ([id number]) -------------------------------------------------------------------------------- Pain Assessment Details [name]. Date of Service: 11/05/2015 3:45 PM Medical Record Number: [id number] Patient Account Number: [id number] Date of Birth/Sex: 01-06-1956  (60 y.o. Female) Treating RN: Baruch Gouty, RN, [name], [name] Primary Care Physician: [name] Other Clinician: Referring Physician: [name] Treating Physician/Extender: Frann Rider in Treatment: 45 Active Problems Location of Pain Severity and Description of Pain Patient Has Paino No Site Locations Pain Management and Medication Current Pain Management: Electronic Signature(s) Signed: 11/05/2015 4:25:47 PM By: Regan Lemming [name], RN Entered By: Regan Lemming on 11/05/2015 15:32:54 Leflore, [name] ([id number]) -------------------------------------------------------------------------------- Patient/Caregiver Education Details [name]. Date of Service: 11/05/2015 3:45 PM Medical Record Number: [id number] Patient Account Number: [id number] Date of Birth/Gender: 21-Apr-1956 (60 y.o. Female) Treating RN: Baruch Gouty, RN, [name], [name] Primary Care Physician: [name] Other Clinician: Referring Physician: [name] Treating Physician/Extender: Frann Rider in Treatment: 87 Education Assessment Education Provided To: Patient Education Topics Provided Safety: Methods: Explain/Verbal Responses: State content correctly Wound/Skin Impairment: Methods: Explain/Verbal Responses: State content correctly Electronic Signature(s) Signed: 11/05/2015 4:25:47 PM By: Regan Lemming [name], RN Entered By: Regan Lemming on 11/05/2015 15:43:47 Mair, [name] ([id number]) -------------------------------------------------------------------------------- Wound Assessment Details [name], Salma S. Date of  Service: 11/05/2015 3:45 PM Medical Record Number: [id number] Patient Account Number: [id number] Date of Birth/Sex: Jul 31, 1955 (60 y.o. Female) Treating RN: [name], RN, [name], [name] Primary Care Physician: [name] Other Clinician: Referring Physician: [name] Treating Physician/Extender: Frann Rider in Treatment: 52 Wound Status Wound Number: 1  Primary Pressure Ulcer Etiology: Wound Location: Right Back Wound Open Wounding Event: Blister Status: Date Acquired: 10/19/2013 Comorbid Glaucoma, Anemia, Coronary Artery Weeks Of Treatment: 75 History: Disease, Hypertension, Type II Clustered Wound: No Diabetes, End Stage Renal Disease, Osteoarthritis, Neuropathy Photos Photo Uploaded By: Regan Lemming on 11/05/2015 16:24:31 Wound Measurements Length: (cm) 2 Width: (cm) 2 Depth: (cm) 0.1 Area: (cm) 3.142 Volume: (cm) 0.314 % Reduction in Area: 91.8% % Reduction in Volume: 91.8% Epithelialization: Medium (34-66%) Tunneling: No Undermining: No Wound Description Classification: Category/Stage II Wound Margin: Indistinct, nonvisible Exudate Amount: Small Exudate Type: Sanguinous Exudate Color: red Foul Odor After Cleansing: No Wound Bed Granulation Amount: Large (67-100%) Exposed Structure Granulation Quality: Red, Hyper-granulation Fascia Exposed: No Necrotic Amount: None Present (0%) Fat Layer Exposed: No Goguen, Ezrie S. ([id number]) Tendon Exposed: No Muscle Exposed: No Joint Exposed: No Bone Exposed: No Limited to Skin Breakdown Periwound Skin Texture Texture Color No Abnormalities Noted: No No Abnormalities Noted: No Callus: No Atrophie Blanche: No Crepitus: No Cyanosis: No Excoriation: No Ecchymosis: No Fluctuance: No Erythema: No Friable: No Hemosiderin Staining: No Induration: No Mottled: No Localized Edema: No Pallor: No Rash: No Rubor: No Scarring: No Temperature / Pain Moisture Temperature: No Abnormality No Abnormalities Noted: No Dry / Scaly: No Maceration: No Moist: Yes Wound Preparation Ulcer Cleansing: Rinsed/Irrigated with Saline Topical Anesthetic Applied: None Treatment Notes Wound #1 (Right Back) 1. Cleansed with: Clean wound with Normal Saline 4. Dressing Applied: Other dressing (specify in notes) Notes cutimed siltec sorbact Electronic Signature(s) Signed: 11/05/2015  4:25:47 PM By: Regan Lemming [name], RN Entered By: Regan Lemming on 11/05/2015 15:41:20 Laroche, [name] ([id number]) -------------------------------------------------------------------------------- Vitals Details [name]. Date of Service: 11/05/2015 3:45 PM Medical Record Number: [id number] Patient Account Number: [id number] Date of Birth/Sex: 1955/12/22 (60 y.o. Female) Treating RN: [name], RN, [name], [name] Primary Care Physician: [name] Other Clinician: Referring Physician: [name] Treating Physician/Extender: Frann Rider in Treatment: 45 Vital Signs Time Taken: 15:33 Temperature (F): 98.2 Height (in): 65 Pulse (bpm): 80 Weight (lbs): 156 Respiratory Rate (breaths/min): 18 Body Mass Index (BMI): 26 Blood Pressure (mmHg): 148/57 Reference Range: 80 - 120 mg / dl Electronic Signature(s) Signed: 11/05/2015 4:25:47 PM By: Regan Lemming [name], RN Entered By: Regan Lemming on 11/05/2015 15:35:06

## 2015-11-13 ENCOUNTER — Encounter: Payer: Medicare Other | Admitting: Surgery

## 2015-11-13 DIAGNOSIS — E11622 Type 2 diabetes mellitus with other skin ulcer: Secondary | ICD-10-CM | POA: Diagnosis not present

## 2015-11-13 NOTE — Progress Notes (Addendum)
[name], [name] ([id number]) Visit Report for 11/13/2015 Chief Complaint Document Details [name], [name] 11/13/2015 12:45 Date of Service: PM Medical Record [id number] Number: Patient Account Number: [id number] March 14, 1956 (60 y.o. Treating RN: [name] Date of Birth/Sex: Female) Other Clinician: Primary Care Physician: [name] Treating [name] Referring Physician: Nicky [name] Physician/Extender: [name]: 46 Information Obtained from: Patient Chief Complaint Patient presents to the wound care center for a consult due non healing wound. She has an open wound on her right upper back which she's had for about a year and she recently noticed a blister on her right lower extremity about 2 weeks ago. Electronic Signature(s) Signed: 11/13/2015 1:04:25 PM By: [name] Fudge MD, [name] Entered By: [name] on 11/13/2015 13:04:24 [name], [name] ([id number]) -------------------------------------------------------------------------------- HPI Details [name], [name] 11/13/2015 12:45 Date of Service: PM Medical Record [id number] Number: Patient Account Number: [id number] Nov 22, 1955 (60 y.o. Treating RN: [name] Date of Birth/Sex: Female) Other Clinician: Primary Care Physician: [name] Treating [name] Referring Physician: Nicky [name] Physician/Extender: [name]: 7 History of Present Illness Location: right upper back and right lower extremity wounds Quality: Patient reports No Pain. Severity: Patient states wound (s) are getting better. Duration: Patient has had the wound for > 3 months prior to seeking treatment at the wound center Timing: she thought it first occurred when she was using a heating pad about a year ago after back surgery. Context: The wound appeared gradually over time Modifying Factors: Patient is currently on renal dialysis and receives treatments 3 times weekly Associated  Signs and Symptoms: Patient reports having: surgery scheduled for this week for a AV fistula left arm. HPI Description: 60 year old patient who is known to be a diabetic and has end-stage renal disease has had several comorbidities including coronary artery disease, hypertension, hyperlipidemia, pancreatitis, anemia, previous history of hysterectomy, cholecystectomy, left-sided salivary gland excision, bilateral cataract surgery,Peritoneal dialysis catheter, hemodialysis catheter. the area on the back has also been caused by instant pressure she used to sleep on a recliner all day and has significant kyphoscoliosis. As far as the wound on her right lower extremity she's not sure how this blister occurred but she thought it has been there for about 2 weeks. No recent blood investigations available and no recent hemoglobin A1c. 12/30/2014 -- she is an assisted living facility but I believe the nurses that have not followed instructions as she had some cream applied on her back and there was a different dressing. Last week she's had a AV fistula placed on her left forearm. 01/13/2015 -- she has had some localized infection at the port site and she's been on doxycycline for this. 02/05/2015 - he has developed a small blister on her right lower extremity. 02/10/2015 -- she has developed another small blister on her right anterior chest wall in the area where she's had tape for her dialysis access. this may just be injury caused by a tape burn. 02/18/2015 -- no new blisters and she had a dermatology opinion and they have taken a biopsy of her skin. She also had a left brachial AV fistula placed this week. 03/03/2015 -- though we do not have the pathology report yet the patient says she has been put on prednisone because this skin disease is possibly to do with her immune system and her dermatologist is recommended this. 03/10/2015 -- she has developed a new blister which is quite large on her right  lower extremity on the shin.  05/22/2015 -- she did very well with her dressing but on trying to remove the [place] it peels away [name], [name] ([id number]) the newly healed skin. 06/05/2015 -- the patient was in the ER this past week for severe bleeding from the right nostril and had to have ENT see her and do a packing. They've also been holding her heparin during her dialysis. The patient is going back to ENT today. Other than that she has had no other significant issues. 06/19/2015 -- she is back on her blood thinners and continues to have her hemodialysis as before. 07/31/2015 -- a large blister has popped up again on her right lower extremity in the same place in the mid shin area in the anterior lateral compartment. 10/05/2015 -- last week she was admitted to the hospital for 4 days as she had a pneumonia and was treated with IV and oral antibiotics. Electronic Signature(s) Signed: 11/13/2015 1:04:28 PM By: [name] Fudge MD, [name] Entered By: [name] on 11/13/2015 13:04:28 [name], [name] ([id number]) -------------------------------------------------------------------------------- Physical Exam Details [name] 11/13/2015 12:45 Date of Service: PM Medical Record [id number] Number: Patient Account Number: [id number] 28-Dec-1955 (60 y.o. Treating RN: [name] Date of Birth/Sex: Female) Other Clinician: Primary Care Physician: [name] Treating Kristi Norment Referring Physician: Nicky [name] Physician/Extender: [name]: 46 Constitutional . Pulse regular. Respirations normal and unlabored. Afebrile. . Eyes Nonicteric. Reactive to light. Ears, Nose, Mouth, and Throat Lips, teeth, and gums WNL.Marland Kitchen Moist mucosa without lesions. Neck supple and nontender. No palpable supraclavicular or cervical adenopathy. Normal sized without goiter. Respiratory WNL. No retractions.. Cardiovascular Pedal Pulses WNL. No clubbing, cyanosis or  edema. Lymphatic No adneopathy. No adenopathy. No adenopathy. Musculoskeletal Adexa without tenderness or enlargement.. Digits and nails w/o clubbing, cyanosis, infection, petechiae, ischemia, or inflammatory conditions.. Integumentary (Hair, Skin) No suspicious lesions. No crepitus or fluctuance. No peri-wound warmth or erythema. No masses.Marland Kitchen Psychiatric Judgement and insight Intact.. No evidence of depression, anxiety, or agitation.. Notes the wound continues to have a couple of open areas which seem to open out every time the dressing is removed. the skin is very fragile and I'm afraid it's impossible to completely get this healed Electronic Signature(s) Signed: 11/13/2015 1:05:28 PM By: [name] Fudge MD, [name] Entered By: [name] on 11/13/2015 13:05:28 [place]son, [name] ([id number]) -------------------------------------------------------------------------------- Physician Orders Details [name], [name] 11/13/2015 12:45 Date of Service: PM Medical Record [id number] Number: Patient Account Number: [id number] 1955/09/09 (60 y.o. Treating RN: Cornell Barman Date of Birth/Sex: Female) Other Clinician: Primary Care Physician: [name] Treating Zarria Towell Referring Physician: Nicky [name] Physician/Extender: [place]ella [place] in Treatment: 46 Verbal / Phone Orders: Yes Clinician: Cornell Barman Read Back and Verified: No Diagnosis Coding ICD-10 Coding Code Description E11.622 Type 2 diabetes mellitus with other skin ulcer L89.112 Pressure ulcer of right upper back, stage 2 S80.821A Blister (nonthermal), right lower leg, initial encounter N18.6 End stage renal disease Wound Cleansing Wound #1 Right Back o Clean wound with Normal Saline. Skin Barriers/Peri-Wound Care Wound #1 Right Back o Skin Prep Primary Wound Dressing Wound #1 Right Back o Other: - Siltec Sorbact - SNF RN may change dressing if needed as drainage pass white border. Extra piece given to  patient. Dressing Change Frequency Wound #1 Right Back o Change dressing every week - unless saturated. Gently pull of old dressing due to patient thin epetheilized skin and may wet with saline if needed. Follow-up Appointments Wound #1 Right Back o Return Appointment in 1  week. Electronic Signature(s) Signed: 11/13/2015 4:23:46 PM By: [name] Fudge MD, [name] Signed: 11/13/2015 4:53:26 PM By: Gretta Cool RN, [name], Kim RN, [name] [name], [name] ([id number]) Entered By: Gretta Cool RN, [name], Kim on 11/13/2015 13:06:36 [place], [name] ([id number]) -------------------------------------------------------------------------------- Problem List Details Patient Name: MAYBELINE, BULLEY 11/13/2015 12:45 Date of Service: PM Medical Record [id number] Number: Patient Account Number: [id number] June 25, 1956 (60 y.o. Treating RN: [name] Date of Birth/Sex: Female) Other Clinician: Primary Care Physician: [name] Treating Honour Schwieger Referring Physician: Nicky [name] Physician/Extender: [name]: 46 Active Problems ICD-10 Encounter Code Description Active Date Diagnosis E11.622 Type 2 diabetes mellitus with other skin ulcer 12/23/2014 Yes L89.112 Pressure ulcer of right upper back, stage 2 12/23/2014 Yes S80.821A Blister (nonthermal), right lower leg, initial encounter 12/23/2014 Yes N18.6 End stage renal disease 12/23/2014 Yes Inactive Problems Resolved Problems Electronic Signature(s) Signed: 11/13/2015 1:04:08 PM By: [name] Fudge MD, [name] Entered By: [name] on 11/13/2015 13:04:08 [name], [name] ([id number]) -------------------------------------------------------------------------------- Progress Note Details Patient Name: [name] Cornwall. 11/13/2015 12:45 Date of Service: PM Medical Record [id number] Number: Patient Account Number: [id number] [birth year] (60 y.o. Treating RN: [name] Date of Birth/Sex: Female) Other Clinician: Primary Care Physician: [name]  Treating Dedrick Heffner Referring Physician: Nicky [name] Physician/Extender: [name]: 46 Subjective Chief Complaint Information obtained from Patient Patient presents to the wound care center for a consult due non healing wound. She has an open wound on her right upper back which she's had for about a year and she recently noticed a blister on her right lower extremity about 2 weeks ago. History of Present Illness (HPI) The following HPI elements were documented for the patient's wound: Location: right upper back and right lower extremity wounds Quality: Patient reports No Pain. Severity: Patient states wound (s) are getting better. Duration: Patient has had the wound for > 3 months prior to seeking treatment at the wound center Timing: she thought it first occurred when she was using a heating pad about a year ago after back surgery. Context: The wound appeared gradually over time Modifying Factors: Patient is currently on renal dialysis and receives treatments 3 times weekly Associated Signs and Symptoms: Patient reports having: surgery scheduled for this week for a AV fistula left arm. 60 year old patient who is known to be a diabetic and has end-stage renal disease has had several comorbidities including coronary artery disease, hypertension, hyperlipidemia, pancreatitis, anemia, previous history of hysterectomy, cholecystectomy, left-sided salivary gland excision, bilateral cataract surgery,Peritoneal dialysis catheter, hemodialysis catheter. the area on the back has also been caused by instant pressure she used to sleep on a recliner all day and has significant kyphoscoliosis. As far as the wound on her right lower extremity she's not sure how this blister occurred but she thought it has been there for about 2 weeks. No recent blood investigations available and no recent hemoglobin A1c. 12/30/2014 -- she is an assisted living facility but I believe the nurses that  have not followed instructions as she had some cream applied on her back and there was a different dressing. Last week she's had a AV fistula placed on her left forearm. 01/13/2015 -- she has had some localized infection at the port site and she's been on doxycycline for this. 02/05/2015 - he has developed a small blister on her right lower extremity. 02/10/2015 -- she has developed another small blister on her right anterior chest wall in the area where she's had tape for her dialysis access. this may just be  injury caused by a tape burn. [name], [name] ([id number]) 02/18/2015 -- no new blisters and she had a dermatology opinion and they have taken a biopsy of her skin. She also had a left brachial AV fistula placed this week. 03/03/2015 -- though we do not have the pathology report yet the patient says she has been put on prednisone because this skin disease is possibly to do with her immune system and her dermatologist is recommended this. 03/10/2015 -- she has developed a new blister which is quite large on her right lower extremity on the shin. 05/22/2015 -- she did very well with her dressing but on trying to remove the [place] it peels away the newly healed skin. 06/05/2015 -- the patient was in the ER this past week for severe bleeding from the right nostril and had to have ENT see her and do a packing. They've also been holding her heparin during her dialysis. The patient is going back to ENT today. Other than that she has had no other significant issues. 06/19/2015 -- she is back on her blood thinners and continues to have her hemodialysis as before. 07/31/2015 -- a large blister has popped up again on her right lower extremity in the same place in the mid shin area in the anterior lateral compartment. 10/05/2015 -- last week she was admitted to the hospital for 4 days as she had a pneumonia and was treated with IV and oral antibiotics. Objective Constitutional Pulse  regular. Respirations normal and unlabored. Afebrile. Vitals Time Taken: 12:52 PM, Height: 65 in, Weight: 156 lbs, BMI: 26, Temperature: 98.3 F, Pulse: 82 bpm, Respiratory Rate: 18 breaths/min, Blood Pressure: 165/68 mmHg. Eyes Nonicteric. Reactive to light. Ears, Nose, Mouth, and Throat Lips, teeth, and gums WNL.Marland Kitchen Moist mucosa without lesions. Neck supple and nontender. No palpable supraclavicular or cervical adenopathy. Normal sized without goiter. Respiratory WNL. No retractions.. Cardiovascular Pedal Pulses WNL. No clubbing, cyanosis or edema. [name], [name] ([id number]) Lymphatic No adneopathy. No adenopathy. No adenopathy. Musculoskeletal Adexa without tenderness or enlargement.. Digits and nails w/o clubbing, cyanosis, infection, petechiae, ischemia, or inflammatory conditions.Marland Kitchen Psychiatric Judgement and insight Intact.. No evidence of depression, anxiety, or agitation.. General Notes: the wound continues to have a couple of open areas which seem to open out every time the dressing is removed. the skin is very fragile and I'm afraid it's impossible to completely get this healed Integumentary (Hair, Skin) No suspicious lesions. No crepitus or fluctuance. No peri-wound warmth or erythema. No masses.. Wound #1 status is Open. Original cause of wound was Blister. The wound is located on the Right Back. The wound measures 1cm length x 3.2cm width x 0.1cm depth; 2.513cm^2 area and 0.251cm^3 volume. The wound is limited to skin breakdown. There is a small amount of sanguinous drainage noted. The wound margin is indistinct and nonvisible. There is large (67-100%) red granulation within the wound bed. There is no necrotic tissue within the wound bed. The periwound skin appearance exhibited: Dry/Scaly, Moist. The periwound skin appearance did not exhibit: Callus, Crepitus, Excoriation, Fluctuance, Friable, Induration, Localized Edema, Rash, Scarring, Maceration, Atrophie Blanche,  Cyanosis, Ecchymosis, Hemosiderin Staining, Mottled, Pallor, Rubor, Erythema. Periwound temperature was noted as No Abnormality. Assessment Active Problems ICD-10 E11.622 - Type 2 diabetes mellitus with other skin ulcer L89.112 - Pressure ulcer of right upper back, stage 2 S80.821A - Blister (nonthermal), right lower leg, initial encounter N18.6 - End stage renal disease Plan We will try and continue with this same [place] through  the week and hope she remains healed next week. the scar tissue and skin is very fragile and every time her dressing is removed in spite of perform it opens out again. [name], [name] ([id number]) Electronic Signature(s) Signed: 11/13/2015 1:06:46 PM By: [name] Fudge MD, [name] Entered By: [name] on 11/13/2015 13:06:46 [place], [name] ([id number]) -------------------------------------------------------------------------------- SuperBill Details Patient Name: [name] Cornwall. Date of Service: 11/13/2015 Medical Record Number: [id number] Patient Account Number: [id number] Date of Birth/Sex: 1956-01-07 (60 y.o. Female) Treating RN: [name] Primary Care Physician: [name] Other Clinician: Referring Physician: Nicky [name] Treating Physician/Extender: Frann Rider in Treatment: 46 Diagnosis Coding ICD-10 Codes Code Description E11.622 Type 2 diabetes mellitus with other skin ulcer L89.112 Pressure ulcer of right upper back, stage 2 S80.821A Blister (nonthermal), right lower leg, initial encounter N18.6 End stage renal disease Facility Procedures CPT4 Code: [id number] Description: 99213 - WOUND CARE VISIT-LEV 3 EST PT Modifier: Quantity: 1 Physician Procedures CPT4 Code: [id number] Description: [id number] - WC PHYS LEVEL 3 - EST PT ICD-10 Description Diagnosis E11.622 Type 2 diabetes mellitus with other skin ulcer L89.112 Pressure ulcer of right upper back, stage 2 S80.821A Blister (nonthermal), right lower leg, initial enc Modifier:  ounter Quantity: 1 Electronic Signature(s) Signed: 11/13/2015 4:23:46 PM By: [name] Fudge MD, [name] Signed: 11/13/2015 4:53:26 PM By: Gretta Cool RN, [name], Kim RN, [name] Previous Signature: 11/13/2015 1:07:04 PM Version By: [name] Fudge MD, [name] Entered By: Gretta Cool, RN, [name], Kim on 11/13/2015 13:07:24

## 2015-11-14 NOTE — Progress Notes (Signed)
NAJAYA, HOLDAWAY ([id number]) Visit Report for 11/13/2015 Arrival Information Details [name]. Date of Service: 11/13/2015 12:45 PM Medical Record Number: [id number] Patient Account Number: [id number] Date of Birth/Sex: 10-07-1955 (60 y.o. Female) Treating RN: Cornell Barman Primary Care Physician: [name] Other Clinician: Referring Physician: [name] Treating Physician/Extender: Frann Rider in Treatment: 65 Visit Information History Since Last Visit Added or deleted any medications: No Patient Arrived: Walker Any new allergies or adverse reactions: No Arrival Time: 12:50 Had a fall or experienced change in No Accompanied By: self activities of daily living that may affect Transfer Assistance: None risk of falls: Patient Identification Verified: Yes Signs or symptoms of abuse/neglect since last No Secondary Verification Process Yes visito Completed: Hospitalized since last visit: No Patient Requires Transmission- No Has Dressing in Place as Prescribed: Yes Based Precautions: Pain Present Now: No Patient Has Alerts: Yes Patient Alerts: Patient on Blood Thinner Electronic Signature(s) Signed: 11/13/2015 4:53:26 PM By: Gretta Cool, RN, [name], Kim RN, [name] Entered By: Gretta Cool, RN, [name], Kim on 11/13/2015 12:52:06 Batta, [name] ([id number]) -------------------------------------------------------------------------------- Clinic Level of Care Assessment Details [name]. Date of Service: 11/13/2015 12:45 PM Medical Record Number: [id number] Patient Account Number: [id number] Date of Birth/Sex: 1955-09-28 (60 y.o. Female) Treating RN: Cornell Barman Primary Care Physician: [name] Other Clinician: Referring Physician: [name] Treating Physician/Extender: Frann Rider in Treatment: 72 Clinic Level of Care Assessment Items TOOL 4 Quantity Score []  - Use when only an EandM is performed on FOLLOW-UP visit 0 ASSESSMENTS -  Nursing Assessment / Reassessment X - Reassessment of Co-morbidities (includes updates in patient status) 1 10 X - Reassessment of Adherence to Treatment Plan 1 5 ASSESSMENTS - Wound and Skin Assessment / Reassessment X - Simple Wound Assessment / Reassessment - one wound 1 5 []  - Complex Wound Assessment / Reassessment - multiple wounds 0 []  - Dermatologic / Skin Assessment (not related to wound area) 0 ASSESSMENTS - Focused Assessment []  - Circumferential Edema Measurements - multi extremities 0 []  - Nutritional Assessment / Counseling / Intervention 0 []  - Lower Extremity Assessment (monofilament, tuning fork, pulses) 0 []  - Peripheral Arterial Disease Assessment (using hand held doppler) 0 ASSESSMENTS - Ostomy and/or Continence Assessment and Care []  - Incontinence Assessment and Management 0 []  - Ostomy Care Assessment and Management (repouching, etc.) 0 PROCESS - Coordination of Care X - Simple Patient / Family Education for ongoing care 1 15 []  - Complex (extensive) Patient / Family Education for ongoing care 0 []  - Staff obtains Programmer, systems, Records, Test Results / Process Orders 0 X - Staff telephones HHA, Nursing Homes / Clarify orders / etc 1 10 []  - Routine Transfer to another Facility (non-emergent condition) 0 [name], [name] ([id number]) []  - Routine Hospital Admission (non-emergent condition) 0 []  - New Admissions / Biomedical engineer / Ordering NPWT, Apligraf, etc. 0 []  - Emergency Hospital Admission (emergent condition) 0 X - Simple Discharge Coordination 1 10 []  - Complex (extensive) Discharge Coordination 0 PROCESS - Special Needs []  - Pediatric / Minor Patient Management 0 []  - Isolation Patient Management 0 []  - Hearing / Language / Visual special needs 0 []  - Assessment of Community assistance (transportation, D/C planning, etc.) 0 []  - Additional assistance / Altered mentation 0 []  - Support Surface(s) Assessment (bed, cushion, seat, etc.) 0 INTERVENTIONS -  Wound Cleansing / Measurement X - Simple Wound Cleansing - one wound 1 5 []  - Complex Wound Cleansing - multiple wounds 0 []  -  Wound Imaging (photographs - any number of wounds) 0 []  - Wound Tracing (instead of photographs) 0 X - Simple Wound Measurement - one wound 1 5 []  - Complex Wound Measurement - multiple wounds 0 INTERVENTIONS - Wound Dressings X - Small Wound Dressing one or multiple wounds 1 10 []  - Medium Wound Dressing one or multiple wounds 0 []  - Large Wound Dressing one or multiple wounds 0 []  - Application of Medications - topical 0 []  - Application of Medications - injection 0 INTERVENTIONS - Miscellaneous []  - External ear exam 0 Cage, [name] ([id number]) []  - Specimen Collection (cultures, biopsies, blood, body fluids, etc.) 0 []  - Specimen(s) / Culture(s) sent or taken to Lab for analysis 0 []  - Patient Transfer (multiple staff / Harrel Lemon Lift / Similar devices) 0 []  - Simple Staple / Suture removal (25 or less) 0 []  - Complex Staple / Suture removal (26 or more) 0 []  - Hypo / Hyperglycemic Management (close monitor of Blood Glucose) 0 []  - Ankle / Brachial Index (ABI) - do not check if billed separately 0 X - Vital Signs 1 5 Has the patient been seen at the hospital within the last three years: Yes Total Score: 80 Level Of Care: New/Established - Level 3 Electronic Signature(s) Signed: 11/13/2015 4:53:26 PM By: Gretta Cool, RN, [name], Kim RN, [name] Entered By: Gretta Cool, RN, [name], Kim on 11/13/2015 13:07:07 [name] ([id number]) -------------------------------------------------------------------------------- Encounter Discharge Information Details [name]. Date of Service: 11/13/2015 12:45 PM Medical Record Number: [id number] Patient Account Number: [id number] Date of Birth/Sex: 19-Feb-1956 (60 y.o. Female) Treating RN: Cornell Barman Primary Care Physician: [name] Other Clinician: Referring Physician: [name] Treating Physician/Extender:  Frann Rider in Treatment: 69 Encounter Discharge Information Items Discharge Pain Level: 0 Discharge Condition: Stable Ambulatory Status: Walker Nursing Discharge Destination: Home Private Transportation: Auto Accompanied By: self Schedule Follow-up Appointment: Yes Medication Reconciliation completed and Yes provided to Patient/Care Lonald Troiani: Clinical Summary of Care: Electronic Signature(s) Signed: 11/13/2015 4:53:26 PM By: Gretta Cool, RN, [name], Kim RN, [name] Entered By: Gretta Cool, RN, [name], Kim on 11/13/2015 13:09:00 Ewart, [name] ([id number]) -------------------------------------------------------------------------------- Multi Wound Chart Details [name]. Date of Service: 11/13/2015 12:45 PM Medical Record Number: [id number] Patient Account Number: [id number] Date of Birth/Sex: 1956/03/01 (60 y.o. Female) Treating RN: Cornell Barman Primary Care Physician: [name] Other Clinician: Referring Physician: [name] Treating Physician/Extender: Frann Rider in Treatment: 46 Vital Signs Height(in): 65 Pulse(bpm): 82 Weight(lbs): 156 Blood Pressure 165/68 (mmHg): Body Mass Index(BMI): 26 Temperature(F): 98.3 Respiratory Rate 18 (breaths/min): Photos: [1:No Photos] [N/A:N/A] Wound Location: [1:Right Back] [N/A:N/A] Wounding Event: [1:Blister] [N/A:N/A] Primary Etiology: [1:Pressure Ulcer] [N/A:N/A] Comorbid History: [1:Glaucoma, Anemia, Coronary Artery Disease, Hypertension, Type II Diabetes, End Stage Renal Disease, Osteoarthritis, Neuropathy] [N/A:N/A] Date Acquired: [1:10/19/2013] [N/A:N/A] Weeks of Treatment: [1:46] [N/A:N/A] Wound Status: [1:Open] [N/A:N/A] Measurements L x W x D 1x3.2x0.1 [N/A:N/A] (cm) Area (cm) : [1:2.513] [N/A:N/A] Volume (cm) : [1:0.251] [N/A:N/A] % Reduction in Area: [1:93.50%] [N/A:N/A] % Reduction in Volume: 93.50% [N/A:N/A] Classification: [1:Category/Stage II] [N/A:N/A] Exudate Amount: [1:Small]  [N/A:N/A] Exudate Type: [1:Sanguinous] [N/A:N/A] Exudate Color: [1:red] [N/A:N/A] Wound Margin: [1:Indistinct, nonvisible] [N/A:N/A] Granulation Amount: [1:Large (67-100%)] [N/A:N/A] Granulation Quality: [1:Red] [N/A:N/A] Necrotic Amount: [1:None Present (0%)] [N/A:N/A] Exposed Structures: [1:Fascia: No Fat: No Tendon: No] [N/A:N/A] Muscle: No Joint: No Bone: No Limited to Skin Breakdown Epithelialization: Medium (34-66%) N/A N/A Periwound Skin Texture: Edema: No N/A N/A Excoriation: No Induration: No Callus: No Crepitus: No Fluctuance: No Friable: No  Rash: No Scarring: No Periwound Skin Moist: Yes N/A N/A Moisture: Dry/Scaly: Yes Maceration: No Periwound Skin Color: Atrophie Blanche: No N/A N/A Cyanosis: No Ecchymosis: No Erythema: No Hemosiderin Staining: No Mottled: No Pallor: No Rubor: No Temperature: No Abnormality N/A N/A Tenderness on No N/A N/A Palpation: Wound Preparation: Ulcer Cleansing: N/A N/A Rinsed/Irrigated with Saline Topical Anesthetic Applied: None Treatment Notes Electronic Signature(s) Signed: 11/13/2015 4:53:26 PM By: Gretta Cool, RN, [name], Kim RN, [name] Entered By: Gretta Cool, RN, [name], Kim on 11/13/2015 13:00:17 Blitzer, [name] ([id number]) -------------------------------------------------------------------------------- Multi-Disciplinary Care Plan Details [name], KELTY. Date of Service: 11/13/2015 12:45 PM Medical Record Number: [id number] Patient Account Number: [id number] Date of Birth/Sex: October 21, 1955 (60 y.o. Female) Treating RN: Cornell Barman Primary Care Physician: [name] Other Clinician: Referring Physician: [name] Treating Physician/Extender: Frann Rider in Treatment: 55 Active Inactive Abuse / Safety / Falls / Self Care Management Nursing Diagnoses: Potential for falls Goals: Patient will remain injury free Date Initiated: 12/23/2014 Goal Status: Active Patient/caregiver will verbalize understanding of  skin care regimen Date Initiated: 12/23/2014 Goal Status: Active Patient/caregiver will verbalize/demonstrate measures taken to prevent injury and/or falls Date Initiated: 12/23/2014 Goal Status: Active Patient/caregiver will verbalize/demonstrate understanding of what to do in case of emergency Date Initiated: 12/23/2014 Goal Status: Active Interventions: Assess fall risk on admission and as needed Assess: immobility, friction, shearing, incontinence upon admission and as needed Assess impairment of mobility on admission and as needed per policy Assess self care needs on admission and as needed Provide education on fall prevention Notes: Wound/Skin Impairment Nursing Diagnoses: Impaired tissue integrity Knowledge deficit related to ulceration/compromised skin integrity Goals: Patient/caregiver will verbalize understanding of skin care regimen [name], [name] ([id number]) Date Initiated: 12/23/2014 Goal Status: Active Ulcer/skin breakdown will heal within 14 weeks Date Initiated: 12/23/2014 Goal Status: Active Interventions: Assess patient/caregiver ability to obtain necessary supplies Assess patient/caregiver ability to perform ulcer/skin care regimen upon admission and as needed Assess ulceration(s) every visit Provide education on ulcer and skin care Treatment Activities: Skin care regimen initiated : 11/05/2015 Topical wound management initiated : 11/05/2015 Notes: Electronic Signature(s) Signed: 11/13/2015 4:53:26 PM By: Gretta Cool, RN, [name], Kim RN, [name] Entered By: Gretta Cool, RN, [name], Kim on 11/13/2015 12:59:57 Lotter, [name] ([id number]) -------------------------------------------------------------------------------- Pain Assessment Details [name]. Date of Service: 11/13/2015 12:45 PM Medical Record Number: [id number] Patient Account Number: [id number] Date of Birth/Sex: 1955-11-07 (60 y.o. Female) Treating RN: Cornell Barman Primary Care Physician: [name] Other  Clinician: Referring Physician: [name] Treating Physician/Extender: Frann Rider in Treatment: 46 Active Problems Location of Pain Severity and Description of Pain Patient Has Paino No Site Locations With Dressing Change: No Duration of the Pain. Constant / Intermittento Constant Pain Management and Medication Current Pain Management: Electronic Signature(s) Signed: 11/13/2015 4:53:26 PM By: Gretta Cool, RN, [name], Kim RN, [name] Entered By: Gretta Cool, RN, [name], Kim on 11/13/2015 12:52:26 Curran, [name] ([id number]) -------------------------------------------------------------------------------- Patient/Caregiver Education Details [name]. Date of Service: 11/13/2015 12:45 PM Medical Record Number: [id number] Patient Account Number: [id number] Date of Birth/Gender: [birth year] (60 y.o. Female) Treating RN: Cornell Barman Primary Care Physician: [name] Other Clinician: Referring Physician: [name] Treating Physician/Extender: Frann Rider in Treatment: 65 Education Assessment Education Provided To: Patient Education Topics Provided Wound/Skin Impairment: Handouts: Caring for Your Ulcer, Other: leave dressing in place Electronic Signature(s) Signed: 11/13/2015 4:53:26 PM By: Gretta Cool, RN, [name], Kim RN, [name] Entered By: Gretta Cool, RN, [name], Kim on 11/13/2015 13:09:18 Medaglia, [name] ([id number]) --------------------------------------------------------------------------------  Wound Assessment Details [name], CICCHINO. Date of Service: 11/13/2015 12:45 PM Medical Record Number: [id number] Patient Account Number: [id number] Date of Birth/Sex: 03-11-1956 (60 y.o. Female) Treating RN: Cornell Barman Primary Care Physician: [name] Other Clinician: Referring Physician: [name] Treating Physician/Extender: Frann Rider in Treatment: 68 Wound Status Wound Number: 1 Primary Pressure Ulcer Etiology: Wound Location: Right Back Wound  Open Wounding Event: Blister Status: Date Acquired: 10/19/2013 Comorbid Glaucoma, Anemia, Coronary Artery Weeks Of Treatment: 46 History: Disease, Hypertension, Type II Clustered Wound: No Diabetes, End Stage Renal Disease, Osteoarthritis, Neuropathy Photos Photo Uploaded By: Gretta Cool, RN, [name], Kim on 11/13/2015 14:17:14 Wound Measurements Length: (cm) 1 Width: (cm) 3.2 Depth: (cm) 0.1 Area: (cm) 2.513 Volume: (cm) 0.251 % Reduction in Area: 93.5% % Reduction in Volume: 93.5% Epithelialization: Medium (34-66%) Wound Description Classification: Category/Stage II Wound Margin: Indistinct, nonvisible Exudate Amount: Small Exudate Type: Sanguinous Exudate Color: red Foul Odor After Cleansing: No Wound Bed Granulation Amount: Large (67-100%) Exposed Structure Granulation Quality: Red Fascia Exposed: No Necrotic Amount: None Present (0%) Fat Layer Exposed: No Caravello, [name] ([id number]) Tendon Exposed: No Muscle Exposed: No Joint Exposed: No Bone Exposed: No Limited to Skin Breakdown Periwound Skin Texture Texture Color No Abnormalities Noted: No No Abnormalities Noted: No Callus: No Atrophie Blanche: No Crepitus: No Cyanosis: No Excoriation: No Ecchymosis: No Fluctuance: No Erythema: No Friable: No Hemosiderin Staining: No Induration: No Mottled: No Localized Edema: No Pallor: No Rash: No Rubor: No Scarring: No Temperature / Pain Moisture Temperature: No Abnormality No Abnormalities Noted: No Dry / Scaly: Yes Maceration: No Moist: Yes Wound Preparation Ulcer Cleansing: Rinsed/Irrigated with Saline Topical Anesthetic Applied: None Treatment Notes Wound #1 (Right Back) 1. Cleansed with: Clean wound with Normal Saline Notes cutimed siltec sorbact Electronic Signature(s) Signed: 11/13/2015 4:53:26 PM By: Gretta Cool, RN, [name], Kim RN, [name] Entered By: Gretta Cool, RN, [name], Kim on 11/13/2015 12:59:36 [name], [name]  ([id number]) -------------------------------------------------------------------------------- [place] Details [name]. Date of Service: 11/13/2015 12:45 PM Medical Record Number: [id number] Patient Account Number: [id number] Date of Birth/Sex: [birth year] (60 y.o. Female) Treating RN: Cornell Barman Primary Care Physician: [name] Other Clinician: Referring Physician: [name] Treating Physician/Extender: Frann Rider in Treatment: 9 Vital Signs Time Taken: 12:52 Temperature (F): 98.3 Height (in): 65 Pulse (bpm): 82 Weight (lbs): 156 Respiratory Rate (breaths/min): 18 Body Mass Index (BMI): 26 Blood Pressure (mmHg): 165/68 Reference Range: 80 - 120 mg / dl Electronic Signature(s) Signed: 11/13/2015 4:53:26 PM By: Gretta Cool, RN, [name], Kim RN, [name] Entered By: Gretta Cool, RN, [name], Kim on 11/13/2015 12:53:11

## 2015-11-20 ENCOUNTER — Encounter: Payer: Medicare Other | Attending: Surgery | Admitting: Surgery

## 2015-11-20 DIAGNOSIS — E785 Hyperlipidemia, unspecified: Secondary | ICD-10-CM | POA: Diagnosis not present

## 2015-11-20 DIAGNOSIS — Z992 Dependence on renal dialysis: Secondary | ICD-10-CM | POA: Diagnosis not present

## 2015-11-20 DIAGNOSIS — X58XXXA Exposure to other specified factors, initial encounter: Secondary | ICD-10-CM | POA: Diagnosis not present

## 2015-11-20 DIAGNOSIS — E11622 Type 2 diabetes mellitus with other skin ulcer: Secondary | ICD-10-CM | POA: Insufficient documentation

## 2015-11-20 DIAGNOSIS — S80821A Blister (nonthermal), right lower leg, initial encounter: Secondary | ICD-10-CM | POA: Insufficient documentation

## 2015-11-20 DIAGNOSIS — L89112 Pressure ulcer of right upper back, stage 2: Secondary | ICD-10-CM | POA: Insufficient documentation

## 2015-11-20 DIAGNOSIS — I2510 Atherosclerotic heart disease of native coronary artery without angina pectoris: Secondary | ICD-10-CM | POA: Insufficient documentation

## 2015-11-20 DIAGNOSIS — N186 End stage renal disease: Secondary | ICD-10-CM | POA: Insufficient documentation

## 2015-11-20 DIAGNOSIS — D649 Anemia, unspecified: Secondary | ICD-10-CM | POA: Insufficient documentation

## 2015-11-20 DIAGNOSIS — E1122 Type 2 diabetes mellitus with diabetic chronic kidney disease: Secondary | ICD-10-CM | POA: Diagnosis not present

## 2015-11-20 DIAGNOSIS — I120 Hypertensive chronic kidney disease with stage 5 chronic kidney disease or end stage renal disease: Secondary | ICD-10-CM | POA: Insufficient documentation

## 2015-11-20 NOTE — Progress Notes (Addendum)
[name], [name] ([id number]) Visit Report for 11/20/2015 Arrival Information Details [name], [name]. Date of Service: 11/20/2015 2:15 PM Medical Record Number: [id number] Patient Account Number: [id number] Date of Birth/Sex: May 03, 1956 (60 y.o. Female) Treating RN: Cornell Barman Primary Care Physician: [name] Other Clinician: Referring Physician: [name] Treating Physician/Extender: Frann Rider in Treatment: 34 Visit Information History Since Last Visit Added or deleted any medications: No Patient Arrived: Ambulatory Any new allergies or adverse reactions: No Arrival Time: 14:34 Had a fall or experienced change in No Accompanied By: self activities of daily living that may affect Transfer Assistance: None risk of falls: Patient Identification Verified: Yes Signs or symptoms of abuse/neglect since last No Secondary Verification Process Yes visito Completed: Hospitalized since last visit: No Patient Requires Transmission- No Has Dressing in Place as Prescribed: Yes Based Precautions: Pain Present Now: No Patient Has Alerts: Yes Patient Alerts: Patient on Blood Thinner Electronic Signature([name]) Signed: 11/20/2015 5:12:23 PM By: Gretta Cool, RN, [name], Kim RN, [name] Entered By: Gretta Cool, RN, [name], Kim on 11/20/2015 14:34:32 [name], [name] ([id number]) -------------------------------------------------------------------------------- Clinic Level of Care Assessment Details [name]. Date of Service: 11/20/2015 2:15 PM Medical Record Number: [id number] Patient Account Number: [id number] Date of Birth/Sex: 03/15/1956 (60 y.o. Female) Treating RN: Cornell Barman Primary Care Physician: [name] Other Clinician: Referring Physician: [name] Treating Physician/Extender: Frann Rider in Treatment: 24 Clinic Level of Care Assessment Items TOOL 4 Quantity Score []  - Use when only an EandM is performed on FOLLOW-UP visit 0 ASSESSMENTS -  Nursing Assessment / Reassessment []  - Reassessment of Co-morbidities (includes updates in patient status) 0 X - Reassessment of Adherence to Treatment Plan 1 5 ASSESSMENTS - Wound and Skin Assessment / Reassessment []  - Simple Wound Assessment / Reassessment - one wound 0 X - Complex Wound Assessment / Reassessment - multiple wounds 2 5 []  - Dermatologic / Skin Assessment (not related to wound area) 0 ASSESSMENTS - Focused Assessment []  - Circumferential Edema Measurements - multi extremities 0 []  - Nutritional Assessment / Counseling / Intervention 0 []  - Lower Extremity Assessment (monofilament, tuning fork, pulses) 0 []  - Peripheral Arterial Disease Assessment (using hand held doppler) 0 ASSESSMENTS - Ostomy and/or Continence Assessment and Care []  - Incontinence Assessment and Management 0 []  - Ostomy Care Assessment and Management (repouching, etc.) 0 PROCESS - Coordination of Care X - Simple Patient / Family Education for ongoing care 1 15 []  - Complex (extensive) Patient / Family Education for ongoing care 0 X - Staff obtains Programmer, systems, Records, Test Results / Process Orders 1 10 []  - Staff telephones HHA, Nursing Homes / Clarify orders / etc 0 []  - Routine Transfer to another Facility (non-emergent condition) 0 [name], [name] [name]. ([id number]) []  - Routine Hospital Admission (non-emergent condition) 0 []  - New Admissions / Biomedical engineer / Ordering NPWT, Apligraf, etc. 0 []  - Emergency Hospital Admission (emergent condition) 0 X - Simple Discharge Coordination 1 10 []  - Complex (extensive) Discharge Coordination 0 PROCESS - Special Needs []  - Pediatric / Minor Patient Management 0 []  - Isolation Patient Management 0 []  - Hearing / Language / Visual special needs 0 []  - Assessment of Community assistance (transportation, D/C planning, etc.) 0 []  - Additional assistance / Altered mentation 0 []  - Support Surface([name]) Assessment (bed, cushion, seat, etc.) 0 INTERVENTIONS -  Wound Cleansing / Measurement X - Simple Wound Cleansing - one wound 1 5 []  - Complex Wound Cleansing - multiple wounds 0 X -  Wound Imaging (photographs - any number of wounds) 1 5 []  - Wound Tracing (instead of photographs) 0 X - Simple Wound Measurement - one wound 1 5 []  - Complex Wound Measurement - multiple wounds 0 INTERVENTIONS - Wound Dressings X - Small Wound Dressing one or multiple wounds 1 10 []  - Medium Wound Dressing one or multiple wounds 0 []  - Large Wound Dressing one or multiple wounds 0 []  - Application of Medications - topical 0 []  - Application of Medications - injection 0 INTERVENTIONS - Miscellaneous []  - External ear exam 0 [name], [name] [name]. ([id number]) []  - Specimen Collection (cultures, biopsies, blood, body fluids, etc.) 0 []  - Specimen([name]) / Culture([name]) sent or taken to Lab for analysis 0 []  - Patient Transfer (multiple staff / Harrel Lemon Lift / Similar devices) 0 []  - Simple Staple / Suture removal (25 or less) 0 []  - Complex Staple / Suture removal (26 or more) 0 []  - Hypo / Hyperglycemic Management (close monitor of Blood Glucose) 0 []  - Ankle / Brachial Index (ABI) - do not check if billed separately 0 []  - Vital Signs 0 Has the patient been seen at the hospital within the last three years: Yes Total Score: 75 Level Of Care: New/Established - Level 2 Electronic Signature([name]) Signed: 11/20/2015 5:12:23 PM By: Gretta Cool, RN, [name], Kim RN, [name] Entered By: Gretta Cool, RN, [name], Kim on 11/20/2015 14:43:57 [name], [name] ([id number]) -------------------------------------------------------------------------------- Encounter Discharge Information Details [name], [name]. Date of Service: 11/20/2015 2:15 PM Medical Record Number: [id number] Patient Account Number: [id number] Date of Birth/Sex: 04/30/1956 (60 y.o. Female) Treating RN: Montey Hora Primary Care Physician: [name] Other Clinician: Referring Physician: [name] Treating Physician/Extender:  Frann Rider in Treatment: 21 Encounter Discharge Information Items Discharge Pain Level: 0 Discharge Condition: Stable Ambulatory Status: Walker Discharge Destination: Home Transportation: Private Auto Accompanied By: self Schedule Follow-up Appointment: Yes Medication Reconciliation completed Yes and provided to Patient/Care Jyasia Markoff: Provided on Clinical Summary of Care: 11/20/2015 Form Type Recipient Paper Patient LD Electronic Signature([name]) Signed: 11/20/2015 5:12:23 PM By: Gretta Cool RN, [name], Kim RN, [name] Previous Signature: 11/20/2015 2:44:54 PM Version By: Ruthine Dose Entered By: Gretta Cool RN, [name], Kim on 11/20/2015 14:46:00 Gartin, [name] ([id number]) -------------------------------------------------------------------------------- Multi Wound Chart Details [name]. Date of Service: 11/20/2015 2:15 PM Medical Record Number: [id number] Patient Account Number: [id number] Date of Birth/Sex: 29-May-1956 (60 y.o. Female) Treating RN: Cornell Barman Primary Care Physician: [name] Other Clinician: Referring Physician: [name] Treating Physician/Extender: Frann Rider in Treatment: 60 Vital Signs Height(in): 65 Pulse(bpm): 77 Weight(lbs): 156 Blood Pressure 126/67 (mmHg): Body Mass Index(BMI): 26 Temperature(F): 98.2 Respiratory Rate 18 (breaths/min): Photos: [1:No Photos] [9:No Photos] [N/A:N/A] Wound Location: [1:Right Back] [9:Right Lower Leg] [N/A:N/A] Wounding Event: [1:Blister] [9:Blister] [N/A:N/A] Primary Etiology: [1:Pressure Ulcer] [9:Venous Leg Ulcer] [N/A:N/A] Comorbid History: [1:Glaucoma, Anemia, Coronary Artery Disease, Coronary Artery Disease, Hypertension, Type II Diabetes, End Stage Renal Disease, Osteoarthritis, Neuropathy Osteoarthritis, Neuropathy] [9:Glaucoma, Anemia, Hypertension, Type II Diabetes,  End Stage Renal Disease,] [N/A:N/A] Date Acquired: [1:10/19/2013] [9:11/17/2015] [N/A:N/A] Weeks of Treatment: [1:47]  [9:0] [N/A:N/A] Wound Status: [1:Open] [9:Open] [N/A:N/A] Measurements L x W x D 0.2x0.3x0.1 [9:1.5x2.5x0.1] [N/A:N/A] (cm) Area (cm) : [1:0.047] [9:2.945] [N/A:N/A] Volume (cm) : [1:0.005] [9:0.295] [N/A:N/A] % Reduction in Area: [1:99.90%] [9:0.00%] [N/A:N/A] % Reduction in Volume: 99.90% [9:0.00%] [N/A:N/A] Classification: [1:Category/Stage II] [9:Partial Thickness] [N/A:N/A] HBO Classification: [1:N/A] [9:Grade 1] [N/A:N/A] Exudate Amount: [1:Small] [9:None Present] [N/A:N/A] Exudate Type: [1:Sanguinous] [9:N/A] [N/A:N/A] Exudate Color: [1:red] [9:N/A] [N/A:N/A] Wound  Margin: [1:Indistinct, nonvisible] [9:Flat and Intact] [N/A:N/A] Granulation Amount: [1:Large (67-100%)] [9:None Present (0%)] [N/A:N/A] Granulation Quality: [1:Red] [9:N/A] [N/A:N/A] Necrotic Amount: [1:None Present (0%)] [9:Large (67-100%)] [N/A:N/A] Exposed Structures: [1:Fascia: No Fat: No] [9:Fascia: No Fat: No] [N/A:N/A] Tendon: No Tendon: No Muscle: No Muscle: No Joint: No Joint: No Bone: No Bone: No Limited to Skin Limited to Skin Breakdown Breakdown Epithelialization: Medium (34-66%) None N/A Periwound Skin Texture: Edema: No No Abnormalities Noted N/A Excoriation: No Induration: No Callus: No Crepitus: No Fluctuance: No Friable: No Rash: No Scarring: No Periwound Skin Moist: Yes No Abnormalities Noted N/A Moisture: Dry/Scaly: Yes Maceration: No Periwound Skin Color: Atrophie Blanche: No Erythema: Yes N/A Cyanosis: No Ecchymosis: No Erythema: No Hemosiderin Staining: No Mottled: No Pallor: No Rubor: No Erythema Location: N/A Circumferential N/A Temperature: No Abnormality No Abnormality N/A Tenderness on No Yes N/A Palpation: Wound Preparation: Ulcer Cleansing: Ulcer Cleansing: N/A Rinsed/Irrigated with Rinsed/Irrigated with Saline Saline Topical Anesthetic Topical Anesthetic Applied: None Applied: None Treatment Notes Electronic Signature([name]) Signed: 11/20/2015 5:12:23 PM  By: Gretta Cool, RN, [name], Kim RN, [name] Entered By: Gretta Cool, RN, [name], Kim on 11/20/2015 14:40:27 [name], [name] ([id number]) -------------------------------------------------------------------------------- Multi-Disciplinary Care Plan Details [name], [name]. Date of Service: 11/20/2015 2:15 PM Medical Record Number: [id number] Patient Account Number: [id number] Date of Birth/Sex: 1955/11/30 (60 y.o. Female) Treating RN: Cornell Barman Primary Care Physician: [name] Other Clinician: Referring Physician: [name] Treating Physician/Extender: Frann Rider in Treatment: 41 Active Inactive Abuse / Safety / Falls / Self Care Management Nursing Diagnoses: Potential for falls Goals: Patient will remain injury free Date Initiated: 12/23/2014 Goal Status: Active Patient/caregiver will verbalize understanding of skin care regimen Date Initiated: 12/23/2014 Goal Status: Active Patient/caregiver will verbalize/demonstrate measures taken to prevent injury and/or falls Date Initiated: 12/23/2014 Goal Status: Active Patient/caregiver will verbalize/demonstrate understanding of what to do in case of emergency Date Initiated: 12/23/2014 Goal Status: Active Interventions: Assess fall risk on admission and as needed Assess: immobility, friction, shearing, incontinence upon admission and as needed Assess impairment of mobility on admission and as needed per policy Assess self care needs on admission and as needed Provide education on fall prevention Notes: Wound/Skin Impairment Nursing Diagnoses: Impaired tissue integrity Knowledge deficit related to ulceration/compromised skin integrity Goals: Patient/caregiver will verbalize understanding of skin care regimen JRU, BELLMORE ([id number]) Date Initiated: 12/23/2014 Goal Status: Active Ulcer/skin breakdown will heal within 14 weeks Date Initiated: 12/23/2014 Goal Status: Active Interventions: Assess patient/caregiver ability to  obtain necessary supplies Assess patient/caregiver ability to perform ulcer/skin care regimen upon admission and as needed Assess ulceration([name]) every visit Provide education on ulcer and skin care Treatment Activities: Skin care regimen initiated : 11/05/2015 Topical wound management initiated : 11/05/2015 Notes: Electronic Signature([name]) Signed: 11/20/2015 5:12:23 PM By: Gretta Cool, RN, [name], Kim RN, [name] Entered By: Gretta Cool, RN, [name], Kim on 11/20/2015 14:40:18 [name], [name] ([id number]) -------------------------------------------------------------------------------- Pain Assessment Details [name]. Date of Service: 11/20/2015 2:15 PM Medical Record Number: [id number] Patient Account Number: [id number] Date of Birth/Sex: October 01, 1955 (60 y.o. Female) Treating RN: Cornell Barman Primary Care Physician: [name] Other Clinician: Referring Physician: [name] Treating Physician/Extender: Frann Rider in Treatment: 87 Active Problems Location of Pain Severity and Description of Pain Patient Has Paino No Site Locations Pain Management and Medication Current Pain Management: Electronic Signature([name]) Signed: 11/20/2015 5:12:23 PM By: Gretta Cool, RN, [name], Kim RN, [name] Entered By: Gretta Cool, RN, [name], Kim on 11/20/2015 14:34:59 [name], [name] ([id number]) -------------------------------------------------------------------------------- Patient/Caregiver Education Details Patient Name:  [name], [name] [name]. Date of Service: 11/20/2015 2:15 PM Medical Record Number: [id number] Patient Account Number: [id number] Date of Birth/Gender: 1956/07/05 (60 y.o. Female) Treating RN: Cornell Barman Primary Care Physician: [name] Other Clinician: Referring Physician: [name] Treating Physician/Extender: Frann Rider in Treatment: 79 Education Assessment Education Provided To: Patient Education Topics Provided Wound/Skin Impairment: Handouts: Caring for Your Ulcer Methods:  Demonstration, Explain/Verbal Responses: State content correctly Electronic Signature([name]) Signed: 11/20/2015 5:12:23 PM By: Gretta Cool, RN, [name], Kim RN, [name] Entered By: Gretta Cool, RN, [name], Kim on 11/20/2015 14:46:16 [name], [name] ([id number]) -------------------------------------------------------------------------------- Wound Assessment Details [name], [name]. Date of Service: 11/20/2015 2:15 PM Medical Record Number: [id number] Patient Account Number: [id number] Date of Birth/Sex: Jul 25, 1955 (60 y.o. Female) Treating RN: Cornell Barman Primary Care Physician: [name] Other Clinician: Referring Physician: [name] Treating Physician/Extender: Frann Rider in Treatment: 47 Wound Status Wound Number: 1 Primary Pressure Ulcer Etiology: Wound Location: Right Back Wound Open Wounding Event: Blister Status: Date Acquired: 10/19/2013 Comorbid Glaucoma, Anemia, Coronary Artery Weeks Of Treatment: 47 History: Disease, Hypertension, Type II Clustered Wound: No Diabetes, End Stage Renal Disease, Osteoarthritis, Neuropathy Photos Photo Uploaded By: Gretta Cool, RN, [name], Kim on 11/20/2015 15:43:16 Wound Measurements Length: (cm) 0.2 Width: (cm) 0.3 Depth: (cm) 0.1 Area: (cm) 0.047 Volume: (cm) 0.005 % Reduction in Area: 99.9% % Reduction in Volume: 99.9% Epithelialization: Medium (34-66%) Tunneling: No Undermining: No Wound Description Classification: Category/Stage II Wound Margin: Indistinct, nonvisible Exudate Amount: Small Exudate Type: Sanguinous Exudate Color: red Foul Odor After Cleansing: No Wound Bed Granulation Amount: Large (67-100%) Exposed Structure Granulation Quality: Red Fascia Exposed: No Necrotic Amount: None Present (0%) Fat Layer Exposed: No Raynes, Infantof [name]. ([id number]) Tendon Exposed: No Muscle Exposed: No Joint Exposed: No Bone Exposed: No Limited to Skin Breakdown Periwound Skin Texture Texture Color No Abnormalities Noted: No No  Abnormalities Noted: No Callus: No Atrophie Blanche: No Crepitus: No Cyanosis: No Excoriation: No Ecchymosis: No Fluctuance: No Erythema: No Friable: No Hemosiderin Staining: No Induration: No Mottled: No Localized Edema: No Pallor: No Rash: No Rubor: No Scarring: No Temperature / Pain Moisture Temperature: No Abnormality No Abnormalities Noted: No Dry / Scaly: Yes Maceration: No Moist: Yes Wound Preparation Ulcer Cleansing: Rinsed/Irrigated with Saline Topical Anesthetic Applied: None Treatment Notes Wound #1 (Right Back) 1. Cleansed with: Clean wound with Normal Saline Notes cutimed siltec sorbact Electronic Signature([name]) Signed: 11/20/2015 5:12:23 PM By: Gretta Cool, RN, [name], Kim RN, [name] Entered By: Gretta Cool, RN, [name], Kim on 11/20/2015 14:38:59 Brien, [name] ([id number]) -------------------------------------------------------------------------------- Wound Assessment Details Patient Name: SANYAH, HULL. Date of Service: 11/20/2015 2:15 PM Medical Record Number: [id number] Patient Account Number: [id number] Date of Birth/Sex: Jun 23, 1956 (60 y.o. Female) Treating RN: Cornell Barman Primary Care Physician: [name] Other Clinician: Referring Physician: [name] Treating Physician/Extender: Frann Rider in Treatment: 47 Wound Status Wound Number: 9 Primary Venous Leg Ulcer Etiology: Wound Location: Right Lower Leg Wound Open Wounding Event: Blister Status: Date Acquired: 11/17/2015 Comorbid Glaucoma, Anemia, Coronary Artery Weeks Of Treatment: 0 History: Disease, Hypertension, Type II Clustered Wound: No Diabetes, End Stage Renal Disease, Osteoarthritis, Neuropathy Photos Photo Uploaded By: Gretta Cool, RN, [name], Kim on 11/20/2015 15:43:17 Wound Measurements Length: (cm) 1.5 Width: (cm) 2.5 Depth: (cm) 0.1 Area: (cm) 2.945 Volume: (cm) 0.295 % Reduction in Area: 0% % Reduction in Volume: 0% Epithelialization: None Tunneling: No Undermining: No Wound  Description Classification: Partial Thickness Foul Odor A Diabetic Severity Earleen Newport): Grade 1 Wound Margin: Flat and Intact Exudate Amount: None Present  fter Cleansing: No Wound Bed Granulation Amount: None Present (0%) Exposed Structure Necrotic Amount: Large (67-100%) Fascia Exposed: No Necrotic Quality: Adherent Slough Fat Layer Exposed: No Tendon Exposed: No Finch, Tyiesha [name]. ([id number]) Muscle Exposed: No Joint Exposed: No Bone Exposed: No Limited to Skin Breakdown Periwound Skin Texture Texture Color No Abnormalities Noted: No No Abnormalities Noted: No Erythema: Yes Moisture Erythema Location: Circumferential No Abnormalities Noted: No Temperature / Pain Temperature: No Abnormality Tenderness on Palpation: Yes Wound Preparation Ulcer Cleansing: Rinsed/Irrigated with Saline Topical Anesthetic Applied: None Treatment Notes Wound #9 (Right Lower Leg) 1. Cleansed with: Clean wound with Normal Saline 5. Secondary Dressing Applied Bordered Foam Dressing Electronic Signature([name]) Signed: 11/20/2015 5:12:23 PM By: Gretta Cool, RN, [name], Kim RN, [name] Entered By: Gretta Cool, RN, [name], Kim on 11/20/2015 14:37:56 Fifita, [name] ([id number]) -------------------------------------------------------------------------------- [place] Details [name]. Date of Service: 11/20/2015 2:15 PM Medical Record Number: [id number] Patient Account Number: [id number] Date of Birth/Sex: 1955-12-25 (60 y.o. Female) Treating RN: Cornell Barman Primary Care Physician: [name] Other Clinician: Referring Physician: [name] Treating Physician/Extender: Frann Rider in Treatment: 93 Vital Signs Time Taken: 14:35 Temperature (F): 98.2 Height (in): 65 Pulse (bpm): 77 Weight (lbs): 156 Respiratory Rate (breaths/min): 18 Body Mass Index (BMI): 26 Blood Pressure (mmHg): 126/67 Reference Range: 80 - 120 mg / dl Electronic Signature([name]) Signed: 11/20/2015 5:12:23 PM By: Gretta Cool, RN,  [name], Kim RN, [name] Entered By: Gretta Cool, RN, [name], Kim on 11/20/2015 14:35:21

## 2015-11-20 NOTE — Progress Notes (Addendum)
[name] ([id number]) Visit Report for 11/20/2015 Chief Complaint Document Details [name], [name]. Date of Service: 11/20/2015 2:15 PM Medical Record Number: [id number] Patient Account Number: [id number] Date of Birth/Sex: 11-25-1955 (60 y.o. Female) Treating RN: [name] Primary Care Physician: [name] Other Clinician: Referring Physician: Nicky [name] Treating Physician/Extender: [name] in Treatment: 36 Information Obtained from: Patient Chief Complaint Patient presents to the wound care center for a consult due non healing wound. She has an open wound on her right upper back which she'[name] had for about a year and she recently noticed a blister on her right lower extremity about 2 weeks ago. Electronic Signature([name]) Signed: 11/20/2015 2:24:34 PM By: [name] Fudge MD, [name] Entered By: [name] on 11/20/2015 14:24:34 [name], [name] ([id number]) -------------------------------------------------------------------------------- HPI Details [name], [name]. Date of Service: 11/20/2015 2:15 PM Medical Record Number: [id number] Patient Account Number: [id number] Date of Birth/Sex: November 02, 1955 (60 y.o. Female) Treating RN: [name] Primary Care Physician: [name] Other Clinician: Referring Physician: Nicky [name] Treating Physician/Extender: [name] in Treatment: 64 History of Present Illness Location: right upper back and right lower extremity wounds Quality: Patient reports No Pain. Severity: Patient states wound ([name]) are getting better. Duration: Patient has had the wound for > 3 months prior to seeking treatment at the wound center Timing: she thought it first occurred when she was using a heating pad about a year ago after back surgery. Context: The wound appeared gradually over time Modifying Factors: Patient is currently on renal dialysis and receives treatments 3 times weekly Associated Signs and  Symptoms: Patient reports having: surgery scheduled for this week for a AV fistula left arm. HPI Description: 60 year old patient who is known to be a diabetic and has end-stage renal disease has had several comorbidities including coronary artery disease, hypertension, hyperlipidemia, pancreatitis, anemia, previous history of hysterectomy, cholecystectomy, left-sided salivary gland excision, bilateral cataract surgery,Peritoneal dialysis catheter, hemodialysis catheter. the area on the back has also been caused by instant pressure she used to sleep on a recliner all day and has significant kyphoscoliosis. As far as the wound on her right lower extremity she'[name] not sure how this blister occurred but she thought it has been there for about 2 weeks. No recent blood investigations available and no recent hemoglobin A1c. 12/30/2014 -- she is an assisted living facility but I believe the nurses that have not followed instructions as she had some cream applied on her back and there was a different dressing. Last week she'[name] had a AV fistula placed on her left forearm. 01/13/2015 -- she has had some localized infection at the port site and she'[name] been on doxycycline for this. 02/05/2015 - he has developed a small blister on her right lower extremity. 02/10/2015 -- she has developed another small blister on her right anterior chest wall in the area where she'[name] had tape for her dialysis access. this may just be injury caused by a tape burn. 02/18/2015 -- no new blisters and she had a dermatology opinion and they have taken a biopsy of her skin. She also had a left brachial AV fistula placed this week. 03/03/2015 -- though we do not have the pathology report yet the patient says she has been put on prednisone because this skin disease is possibly to do with her immune system and her dermatologist is recommended this. 03/10/2015 -- she has developed a new blister which is quite large on her right lower  extremity on the shin.  05/22/2015 -- she did very well with her dressing but on trying to remove the [place] it peels away the newly healed skin. [name], [name] ([id number]) 06/05/2015 -- the patient was in the ER this past week for severe bleeding from the right nostril and had to have ENT see her and do a packing. They've also been holding her heparin during her dialysis. The patient is going back to ENT today. Other than that she has had no other significant issues. 06/19/2015 -- she is back on her blood thinners and continues to have her hemodialysis as before. 07/31/2015 -- a large blister has popped up again on her right lower extremity in the same place in the mid shin area in the anterior lateral compartment. 10/05/2015 -- last week she was admitted to the hospital for 4 days as she had a pneumonia and was treated with IV and oral antibiotics. Electronic Signature([name]) Signed: 11/20/2015 2:24:[name] PM By: [name] Fudge MD, [name] Entered By: [name] on 11/20/2015 14:24:[name] [name], [name] ([id number]) -------------------------------------------------------------------------------- Physical Exam Details [name], [name]. Date of Service: 11/20/2015 2:15 PM Medical Record Number: [id number] Patient Account Number: [id number] Date of Birth/Sex: 05-Dec-1955 (60 y.o. Female) Treating RN: [name] Primary Care Physician: [name] Other Clinician: Referring Physician: Nicky [name] Treating Physician/Extender: [name] in Treatment: 55 Constitutional . Pulse regular. Respirations normal and unlabored. Afebrile. . Eyes Nonicteric. Reactive to light. Ears, Nose, Mouth, and Throat Lips, teeth, and gums WNL.Marland Kitchen Moist mucosa without lesions. Neck supple and nontender. No palpable supraclavicular or cervical adenopathy. Normal sized without goiter. Respiratory WNL. No retractions.. Cardiovascular Pedal Pulses WNL. No clubbing, cyanosis or  edema. Lymphatic No adneopathy. No adenopathy. No adenopathy. Musculoskeletal Adexa without tenderness or enlargement.. Digits and nails w/o clubbing, cyanosis, infection, petechiae, ischemia, or inflammatory conditions.. Integumentary (Hair, Skin) No suspicious lesions. No crepitus or fluctuance. No peri-wound warmth or erythema. No masses.Marland Kitchen Psychiatric Judgement and insight Intact.. No evidence of depression, anxiety, or agitation.. Notes except for 2 small spots on her back everything is completely healed. She has got a tiny blister on her right lower extremity where she often gets them in the past Electronic Signature([name]) Signed: 11/20/2015 2:41:40 PM By: [name] Fudge MD, [name] Entered By: [name] on 11/20/2015 14:41:[name] [name], [name] ([id number]) -------------------------------------------------------------------------------- Physician Orders Details [name]. Date of Service: 11/20/2015 2:15 PM Medical Record Number: [id number] Patient Account Number: [id number] Date of Birth/Sex: 31-Jan-1956 (60 y.o. Female) Treating RN: Cornell Barman Primary Care Physician: [name] Other Clinician: Referring Physician: Nicky [name] Treating Physician/Extender: [name] in Treatment: 43 Verbal / Phone Orders: Yes Clinician: Cornell Barman Read Back and Verified: Yes Diagnosis Coding ICD-10 Coding Code Description E11.622 Type 2 diabetes mellitus with other skin ulcer L89.112 Pressure ulcer of right upper back, stage 2 S80.821A Blister (nonthermal), right lower leg, initial encounter N18.6 End stage renal disease Wound Cleansing Wound #1 Right Back o Clean wound with Normal Saline. Wound #9 Right Lower Leg o Clean wound with Normal Saline. Skin Barriers/Peri-Wound Care Wound #1 Right Back o Skin Prep Wound #9 Right Lower Leg o Skin Prep Primary Wound Dressing Wound #1 Right Back o Other: - Siltec Sorbact - SNF RN may change dressing if  needed as drainage pass white border. Extra piece given to patient. Secondary Dressing Wound #9 Right Lower Leg o Boardered Foam Dressing Dressing Change Frequency Wound #1 Right Back o Change dressing every week - unless saturated. Gently pull of old  dressing due to patient thin epetheilized skin and may wet with saline if needed. [name] ([id number]) Wound #9 Right Lower Leg o Change dressing every other day. Follow-up Appointments Wound #1 Right Back o Return Appointment in 1 week. Wound #9 Right Lower Leg o Return Appointment in 1 week. Electronic Signature([name]) Signed: 11/20/2015 4:31:56 PM By: [name] Fudge MD, [name] Signed: 11/20/2015 5:12:23 PM By: Gretta Cool RN, [name], Kim RN, [name] Entered By: Gretta Cool, RN, [name], Kim on 11/20/2015 14:42:05 [name], [name] ([id number]) -------------------------------------------------------------------------------- Problem List Details [name], [name]. Date of Service: 11/20/2015 2:15 PM Medical Record Number: [id number] Patient Account Number: [id number] Date of Birth/Sex: Aug 22, 1955 (60 y.o. Female) Treating RN: [name] Primary Care Physician: [name] Other Clinician: Referring Physician: Nicky [name] Treating Physician/Extender: [name] in Treatment: 73 Active Problems ICD-10 Encounter Code Description Active Date Diagnosis E11.622 Type 2 diabetes mellitus with other skin ulcer 12/23/2014 Yes L89.112 Pressure ulcer of right upper back, stage 2 12/23/2014 Yes S80.821A Blister (nonthermal), right lower leg, initial encounter 12/23/2014 Yes N18.6 End stage renal disease 12/23/2014 Yes Inactive Problems Resolved Problems Electronic Signature([name]) Signed: 11/20/2015 2:24:21 PM By: [name] Fudge MD, [name] Entered By: [name] on 11/20/2015 14:24:21 [name], [name] ([id number]) -------------------------------------------------------------------------------- Progress Note Details [name], [name]. Date of Service: 11/20/2015 2:15 PM Medical Record Number: [id number] Patient Account Number: [id number] Date of Birth/Sex: [birth year] (60 y.o. Female) Treating RN: [name] Primary Care Physician: [name] Other Clinician: Referring Physician: Nicky [name] Treating Physician/Extender: [name] in Treatment: 62 Subjective Chief Complaint Information obtained from Patient Patient presents to the wound care center for a consult due non healing wound. She has an open wound on her right upper back which she'[name] had for about a year and she recently noticed a blister on her right lower extremity about 2 weeks ago. History of Present Illness (HPI) The following HPI elements were documented for the patient'[name] wound: Location: right upper back and right lower extremity wounds Quality: Patient reports No Pain. Severity: Patient states wound ([name]) are getting better. Duration: Patient has had the wound for > 3 months prior to seeking treatment at the wound center Timing: she thought it first occurred when she was using a heating pad about a year ago after back surgery. Context: The wound appeared gradually over time Modifying Factors: Patient is currently on renal dialysis and receives treatments 3 times weekly Associated Signs and Symptoms: Patient reports having: surgery scheduled for this week for a AV fistula left arm. 60 year old patient who is known to be a diabetic and has end-stage renal disease has had several comorbidities including coronary artery disease, hypertension, hyperlipidemia, pancreatitis, anemia, previous history of hysterectomy, cholecystectomy, left-sided salivary gland excision, bilateral cataract surgery,Peritoneal dialysis catheter, hemodialysis catheter. the area on the back has also been caused by instant pressure she used to sleep on a recliner all day and has significant kyphoscoliosis. As far as the wound on her right lower extremity she'[name] not  sure how this blister occurred but she thought it has been there for about 2 weeks. No recent blood investigations available and no recent hemoglobin A1c. 12/30/2014 -- she is an assisted living facility but I believe the nurses that have not followed instructions as she had some cream applied on her back and there was a different dressing. Last week she'[name] had a AV fistula placed on her left forearm. 01/13/2015 -- she has had some localized infection at the port site and she'[name]  been on doxycycline for this. 02/05/2015 - he has developed a small blister on her right lower extremity. 02/10/2015 -- she has developed another small blister on her right anterior chest wall in the area where she'[name] had tape for her dialysis access. this may just be injury caused by a tape burn. 02/18/2015 -- no new blisters and she had a dermatology opinion and they have taken a biopsy of her skin. [place], [name] ([id number]) She also had a left brachial AV fistula placed this week. 03/03/2015 -- though we do not have the pathology report yet the patient says she has been put on prednisone because this skin disease is possibly to do with her immune system and her dermatologist is recommended this. 03/10/2015 -- she has developed a new blister which is quite large on her right lower extremity on the shin. 05/22/2015 -- she did very well with her dressing but on trying to remove the [place] it peels away the newly healed skin. 06/05/2015 -- the patient was in the ER this past week for severe bleeding from the right nostril and had to have ENT see her and do a packing. They've also been holding her heparin during her dialysis. The patient is going back to ENT today. Other than that she has had no other significant issues. 06/19/2015 -- she is back on her blood thinners and continues to have her hemodialysis as before. 07/31/2015 -- a large blister has popped up again on her right lower extremity in the same place  in the mid shin area in the anterior lateral compartment. 10/05/2015 -- last week she was admitted to the hospital for 4 days as she had a pneumonia and was treated with IV and oral antibiotics. Objective Constitutional Pulse regular. Respirations normal and unlabored. Afebrile. Vitals Time Taken: 2:35 PM, Height: 65 in, Weight: 156 lbs, BMI: 26, Temperature: 98.2 F, Pulse: 77 bpm, Respiratory Rate: 18 breaths/min, Blood Pressure: 126/67 mmHg. Eyes Nonicteric. Reactive to light. Ears, Nose, Mouth, and Throat Lips, teeth, and gums WNL.Marland Kitchen Moist mucosa without lesions. Neck supple and nontender. No palpable supraclavicular or cervical adenopathy. Normal sized without goiter. Respiratory WNL. No retractions.. Cardiovascular Pedal Pulses WNL. No clubbing, cyanosis or edema. Lymphatic No adneopathy. No adenopathy. No adenopathy. [name] ([id number]) Musculoskeletal Adexa without tenderness or enlargement.. Digits and nails w/o clubbing, cyanosis, infection, petechiae, ischemia, or inflammatory conditions.Marland Kitchen Psychiatric Judgement and insight Intact.. No evidence of depression, anxiety, or agitation.. General Notes: except for 2 small spots on her back everything is completely healed. She has got a tiny blister on her right lower extremity where she often gets them in the past Integumentary (Hair, Skin) No suspicious lesions. No crepitus or fluctuance. No peri-wound warmth or erythema. No masses.. Wound #1 status is Open. Original cause of wound was Blister. The wound is located on the Right Back. The wound measures 0.2cm length x 0.3cm width x 0.1cm depth; 0.047cm^2 area and 0.005cm^3 volume. The wound is limited to skin breakdown. There is no tunneling or undermining noted. There is a small amount of sanguinous drainage noted. The wound margin is indistinct and nonvisible. There is large (67- 100%) red granulation within the wound bed. There is no necrotic tissue within the wound  bed. The periwound skin appearance exhibited: Dry/Scaly, Moist. The periwound skin appearance did not exhibit: Callus, Crepitus, Excoriation, Fluctuance, Friable, Induration, Localized Edema, Rash, Scarring, Maceration, Atrophie Blanche, Cyanosis, Ecchymosis, Hemosiderin Staining, Mottled, Pallor, Rubor, Erythema. Periwound temperature was noted as No Abnormality. Wound #  9 status is Open. Original cause of wound was Blister. The wound is located on the Right Lower Leg. The wound measures 1.5cm length x 2.5cm width x 0.1cm depth; 2.945cm^2 area and 0.295cm^3 volume. The wound is limited to skin breakdown. There is no tunneling or undermining noted. There is a none present amount of drainage noted. The wound margin is flat and intact. There is no granulation within the wound bed. There is a large (67-100%) amount of necrotic tissue within the wound bed including Adherent Slough. The periwound skin appearance exhibited: Erythema. The surrounding wound skin color is noted with erythema which is circumferential. Periwound temperature was noted as No Abnormality. The periwound has tenderness on palpation. Assessment Active Problems ICD-10 E11.622 - Type 2 diabetes mellitus with other skin ulcer L89.112 - Pressure ulcer of right upper back, stage 2 S80.821A - Blister (nonthermal), right lower leg, initial encounter N18.6 - End stage renal disease Mckinnon, Dariana [name]. ([id number]) Plan Siltec Sorbact will be placed on her back through the week and hope she remains healed next week. the scar tissue and skin is very fragile and every time her dressing is removed in spite of perform it opens out again. for her right lower extremity blister will use a piece of optive foam and protect this area Electronic Signature([name]) Signed: 11/20/2015 2:42:28 PM By: [name] Fudge MD, [name] Entered By: [name] on 11/20/2015 14:42:28 [place]man, [name]  ([id number]) -------------------------------------------------------------------------------- SuperBill Details Patient Name: Dantuono, Janmarie [name]. Date of Service: 11/20/2015 Medical Record Number: [id number] Patient Account Number: [id number] Date of Birth/Sex: December 20, 1955 (61 y.o. Female) Treating RN: [name] Primary Care Physician: [name] Other Clinician: Referring Physician: Nicky [name] Treating Physician/Extender: [name] in Treatment: 47 Diagnosis Coding ICD-10 Codes Code Description E11.622 Type 2 diabetes mellitus with other skin ulcer L89.112 Pressure ulcer of right upper back, stage 2 S80.821A Blister (nonthermal), right lower leg, initial encounter N18.6 End stage renal disease Physician Procedures CPT4 Code: [id number] Description: [id number] - WC PHYS LEVEL 3 - EST PT ICD-10 Description Diagnosis E11.622 Type 2 diabetes mellitus with other skin ulcer L89.112 Pressure ulcer of right upper back, stage 2 S80.821A Blister (nonthermal), right lower leg, initial enc Modifier: ounter Quantity: 1 Electronic Signature([name]) Signed: 11/20/2015 2:42:42 PM By: [name] Fudge MD, [name] Entered By: [name] on 11/20/2015 14:42:42

## 2015-11-26 ENCOUNTER — Encounter: Payer: Medicare Other | Admitting: Surgery

## 2015-11-26 DIAGNOSIS — E11622 Type 2 diabetes mellitus with other skin ulcer: Secondary | ICD-10-CM | POA: Diagnosis not present

## 2015-11-27 NOTE — Progress Notes (Signed)
JENEA, KOHORST ([id number]) Visit Report for 11/26/2015 Chief Complaint Document Details [name], [name]. Date of Service: 11/26/2015 3:45 PM Medical Record Number: [id number] Patient Account Number: [id number] Date of Birth/Sex: 06-12-1956 (60 y.o. Female) Treating RN: Cornell Barman Primary Care Physician: [name] Other Clinician: Referring Physician: [name] Treating Physician/Extender: Frann Rider in Treatment: 13 Information Obtained from: Patient Chief Complaint Patient presents to the wound care center for a consult due non healing wound. She has an open wound on her right upper back which she's had for about a year and she recently noticed a blister on her right lower extremity about 2 weeks ago. Electronic Signature(s) Signed: 11/26/2015 4:02:28 PM By: [name], [name] Entered By: [name] on 11/26/2015 16:02:27 Burgner, [name] ([id number]) -------------------------------------------------------------------------------- HPI Details [name]. Date of Service: 11/26/2015 3:45 PM Medical Record Number: [id number] Patient Account Number: [id number] Date of Birth/Sex: 1956/05/04 (60 y.o. Female) Treating RN: Cornell Barman Primary Care Physician: [name] Other Clinician: Referring Physician: [name] Treating Physician/Extender: Frann Rider in Treatment: 30 History of Present Illness Location: right upper back and right lower extremity wounds Quality: Patient reports No Pain. Severity: Patient states wound (s) are getting better. Duration: Patient has had the wound for > 3 months prior to seeking treatment at the wound center Timing: she thought it first occurred when she was using a heating pad about a year ago after back surgery. Context: The wound appeared gradually over time Modifying Factors: Patient is currently on renal dialysis and receives treatments 3 times weekly Associated Signs and Symptoms:  Patient reports having: surgery scheduled for this week for a AV fistula left arm. HPI Description: 60 year old patient who is known to be a diabetic and has end-stage renal disease has had several comorbidities including coronary artery disease, hypertension, hyperlipidemia, pancreatitis, anemia, previous history of hysterectomy, cholecystectomy, left-sided salivary gland excision, bilateral cataract surgery,Peritoneal dialysis catheter, hemodialysis catheter. the area on the back has also been caused by instant pressure she used to sleep on a recliner all day and has significant kyphoscoliosis. As far as the wound on her right lower extremity she's not sure how this blister occurred but she thought it has been there for about 2 weeks. No recent blood investigations available and no recent hemoglobin A1c. 12/30/2014 -- she is an assisted living facility but I believe the nurses that have not followed instructions as she had some cream applied on her back and there was a different dressing. Last week she's had a AV fistula placed on her left forearm. 01/13/2015 -- she has had some localized infection at the port site and she's been on doxycycline for this. 02/05/2015 - he has developed a small blister on her right lower extremity. 02/10/2015 -- she has developed another small blister on her right anterior chest wall in the area where she's had tape for her dialysis access. this may just be injury caused by a tape burn. 02/18/2015 -- no new blisters and she had a dermatology opinion and they have taken a biopsy of her skin. She also had a left brachial AV fistula placed this week. 03/03/2015 -- though we do not have the pathology report yet the patient says she has been put on prednisone because this skin disease is possibly to do with her immune system and her dermatologist is recommended this. 03/10/2015 -- she has developed a new blister which is quite large on her right lower extremity on  the shin.  05/22/2015 -- she did very well with her dressing but on trying to remove the [place] it peels away the newly healed skin. LUCA, SANANGELO ([id number]) 06/05/2015 -- the patient was in the ER this past week for severe bleeding from the right nostril and had to have ENT see her and do a packing. They've also been holding her heparin during her dialysis. The patient is going back to ENT today. Other than that she has had no other significant issues. 06/19/2015 -- she is back on her blood thinners and continues to have her hemodialysis as before. 07/31/2015 -- a large blister has popped up again on her right lower extremity in the same place in the mid shin area in the anterior lateral compartment. 10/05/2015 -- last week she was admitted to the hospital for 4 days as she had a pneumonia and was treated with IV and oral antibiotics. Electronic Signature(s) Signed: 11/26/2015 4:02:39 PM By: [name], [name] Entered By: [name] on 11/26/2015 16:02:38 Gancarz, [name] ([id number]) -------------------------------------------------------------------------------- Physical Exam Details [name]. Date of Service: 11/26/2015 3:45 PM Medical Record Number: [id number] Patient Account Number: [id number] Date of Birth/Sex: [birth year] (60 y.o. Female) Treating RN: Cornell Barman Primary Care Physician: [name] Other Clinician: Referring Physician: [name] Treating Physician/Extender: Frann Rider in Treatment: 48 Constitutional . Pulse regular. Respirations normal and unlabored. Afebrile. . Eyes Nonicteric. Reactive to light. Ears, Nose, Mouth, and Throat Lips, teeth, and gums WNL.Marland Kitchen Moist mucosa without lesions. Neck supple and nontender. No palpable supraclavicular or cervical adenopathy. Normal sized without goiter. Respiratory WNL. No retractions.. Cardiovascular Pedal Pulses WNL. No clubbing, cyanosis or edema. Lymphatic No  adneopathy. No adenopathy. No adenopathy. Musculoskeletal Adexa without tenderness or enlargement.. Digits and nails w/o clubbing, cyanosis, infection, petechiae, ischemia, or inflammatory conditions.. Integumentary (Hair, Skin) No suspicious lesions. No crepitus or fluctuance. No peri-wound warmth or erythema. No masses.Marland Kitchen Psychiatric Judgement and insight Intact.. No evidence of depression, anxiety, or agitation.. Notes she has got a very flimsy scar tissue and on testing carefully it may all be closed. Electronic Signature(s) Signed: 11/26/2015 4:03:13 PM By: [name], [name] Entered By: [name] on 11/26/2015 16:03:12 Slayton, [name] ([id number]) -------------------------------------------------------------------------------- Physician Orders Details Patient Name: [name] Date of Service: 11/26/2015 3:45 PM Medical Record Number: [id number] Patient Account Number: [id number] Date of Birth/Sex: 1956/05/29 (60 y.o. Female) Treating RN: Cornell Barman Primary Care Physician: [name] Other Clinician: Referring Physician: [name] Treating Physician/Extender: Frann Rider in Treatment: 49 Verbal / Phone Orders: Yes Clinician: Cornell Barman Read Back and Verified: Yes Diagnosis Coding Wound Cleansing Wound #1 Right Back o Clean wound with Normal Saline. Skin Barriers/Peri-Wound Care Wound #1 Right Back o Skin Prep Primary Wound Dressing Wound #1 Right Back o Other: - Siltec Sorbact - SNF RN may change dressing if needed as drainage pass white border. Extra piece given to patient. Dressing Change Frequency Wound #1 Right Back o Change dressing every week - unless saturated. Gently pull of old dressing due to patient thin epetheilized skin and may wet with saline if needed. Follow-up Appointments Wound #1 Right Back o Return Appointment in 1 week. Electronic Signature(s) Signed: 11/26/2015 4:06:04 PM By: [name], [name] Signed:  11/26/2015 5:50:20 PM By: Gretta Cool RN, [name], Kim RN, [name] Entered By: Gretta Cool, RN, [name], Kim on 11/26/2015 15:56:29 Shampine, [name] ([id number]) -------------------------------------------------------------------------------- Problem List Details Patient Name: BETHZAIDA, BARGIEL. Date of Service: 11/26/2015 3:45 PM Medical Record Number:  [id number] Patient Account Number: [id number] Date of Birth/Sex: [birth year] (60 y.o. Female) Treating RN: Cornell Barman Primary Care Physician: [name] Other Clinician: Referring Physician: [name] Treating Physician/Extender: Frann Rider in Treatment: 54 Active Problems ICD-10 Encounter Code Description Active Date Diagnosis E11.622 Type 2 diabetes mellitus with other skin ulcer 12/23/2014 Yes L89.112 Pressure ulcer of right upper back, stage 2 12/23/2014 Yes S80.821A Blister (nonthermal), right lower leg, initial encounter 12/23/2014 Yes N18.6 End stage renal disease 12/23/2014 Yes Inactive Problems Resolved Problems Electronic Signature(s) Signed: 11/26/2015 4:02:09 PM By: [name], [name] Entered By: [name] on 11/26/2015 16:02:09 Varady, [name] ([id number]) -------------------------------------------------------------------------------- Progress Note Details Patient Name: Flavell, Jeannett S. Date of Service: 11/26/2015 3:45 PM Medical Record Number: [id number] Patient Account Number: [id number] Date of Birth/Sex: Jan 29, 1956 (60 y.o. Female) Treating RN: Cornell Barman Primary Care Physician: [name] Other Clinician: Referring Physician: [name] Treating Physician/Extender: Frann Rider in Treatment: 66 Subjective Chief Complaint Information obtained from Patient Patient presents to the wound care center for a consult due non healing wound. She has an open wound on her right upper back which she's had for about a year and she recently noticed a blister on her right lower extremity about 2 weeks ago. History of Present  Illness (HPI) The following HPI elements were documented for the patient's wound: Location: right upper back and right lower extremity wounds Quality: Patient reports No Pain. Severity: Patient states wound (s) are getting better. Duration: Patient has had the wound for > 3 months prior to seeking treatment at the wound center Timing: she thought it first occurred when she was using a heating pad about a year ago after back surgery. Context: The wound appeared gradually over time Modifying Factors: Patient is currently on renal dialysis and receives treatments 3 times weekly Associated Signs and Symptoms: Patient reports having: surgery scheduled for this week for a AV fistula left arm. 60 year old patient who is known to be a diabetic and has end-stage renal disease has had several comorbidities including coronary artery disease, hypertension, hyperlipidemia, pancreatitis, anemia, previous history of hysterectomy, cholecystectomy, left-sided salivary gland excision, bilateral cataract surgery,Peritoneal dialysis catheter, hemodialysis catheter. the area on the back has also been caused by instant pressure she used to sleep on a recliner all day and has significant kyphoscoliosis. As far as the wound on her right lower extremity she's not sure how this blister occurred but she thought it has been there for about 2 weeks. No recent blood investigations available and no recent hemoglobin A1c. 12/30/2014 -- she is an assisted living facility but I believe the nurses that have not followed instructions as she had some cream applied on her back and there was a different dressing. Last week she's had a AV fistula placed on her left forearm. 01/13/2015 -- she has had some localized infection at the port site and she's been on doxycycline for this. 02/05/2015 - he has developed a small blister on her right lower extremity. 02/10/2015 -- she has developed another small blister on her right anterior  chest wall in the area where she's had tape for her dialysis access. this may just be injury caused by a tape burn. 02/18/2015 -- no new blisters and she had a dermatology opinion and they have taken a biopsy of her skin. Heckart, [name] ([id number]) She also had a left brachial AV fistula placed this week. 03/03/2015 -- though we do not have the pathology report yet the patient says she has been  put on prednisone because this skin disease is possibly to do with her immune system and her dermatologist is recommended this. 03/10/2015 -- she has developed a new blister which is quite large on her right lower extremity on the shin. 05/22/2015 -- she did very well with her dressing but on trying to remove the [place] it peels away the newly healed skin. 06/05/2015 -- the patient was in the ER this past week for severe bleeding from the right nostril and had to have ENT see her and do a packing. They've also been holding her heparin during her dialysis. The patient is going back to ENT today. Other than that she has had no other significant issues. 06/19/2015 -- she is back on her blood thinners and continues to have her hemodialysis as before. 07/31/2015 -- a large blister has popped up again on her right lower extremity in the same place in the mid shin area in the anterior lateral compartment. 10/05/2015 -- last week she was admitted to the hospital for 4 days as she had a pneumonia and was treated with IV and oral antibiotics. Objective Constitutional Pulse regular. Respirations normal and unlabored. Afebrile. Vitals Time Taken: 3:41 PM, Height: 65 in, Weight: 156 lbs, BMI: 26, Temperature: 98.4 F, Pulse: 75 bpm, Respiratory Rate: 18 breaths/min, Blood Pressure: 141/51 mmHg. Eyes Nonicteric. Reactive to light. Ears, Nose, Mouth, and Throat Lips, teeth, and gums WNL.Marland Kitchen Moist mucosa without lesions. Neck supple and nontender. No palpable supraclavicular or cervical adenopathy. Normal  sized without goiter. Respiratory WNL. No retractions.. Cardiovascular Pedal Pulses WNL. No clubbing, cyanosis or edema. Lymphatic No adneopathy. No adenopathy. No adenopathy. [name] ([id number]) Musculoskeletal Adexa without tenderness or enlargement.. Digits and nails w/o clubbing, cyanosis, infection, petechiae, ischemia, or inflammatory conditions.Marland Kitchen Psychiatric Judgement and insight Intact.. No evidence of depression, anxiety, or agitation.. General Notes: she has got a very flimsy scar tissue and on testing carefully it may all be closed. Integumentary (Hair, Skin) No suspicious lesions. No crepitus or fluctuance. No peri-wound warmth or erythema. No masses.. Wound #1 status is Open. Original cause of wound was Blister. The wound is located on the Right Back. The wound measures 0.1cm length x 0.1cm width x 0.1cm depth; 0.008cm^2 area and 0.001cm^3 volume. Wound #9 status is Open. Original cause of wound was Blister. The wound is located on the Right Lower Leg. The wound measures 0cm length x 0cm width x 0cm depth; 0cm^2 area and 0cm^3 volume. Assessment Active Problems ICD-10 E11.622 - Type 2 diabetes mellitus with other skin ulcer L89.112 - Pressure ulcer of right upper back, stage 2 S80.821A - Blister (nonthermal), right lower leg, initial encounter N18.6 - End stage renal disease I have recommended we protect the supple scar with Siltec Sorbact foam for another week and if she remains healed we will discharge her from the wound care services. Plan Wound Cleansing: Wound #1 Right Back: Clean wound with Normal Saline. Skin Barriers/Peri-Wound Care: Wound #1 Right Back: Skin Prep JANALYN, BURNE ([id number]) Primary Wound Dressing: Wound #1 Right Back: Other: - Siltec Sorbact - SNF RN may change dressing if needed as drainage pass white border. Extra piece given to patient. Dressing Change Frequency: Wound #1 Right Back: Change dressing every week - unless  saturated. Gently pull of old dressing due to patient thin epetheilized skin and may wet with saline if needed. Follow-up Appointments: Wound #1 Right Back: Return Appointment in 1 week. I have recommended we protect the supple scar with Siltec Sorbact  foam for another week and if she remains healed we will discharge her from the wound care services. Electronic Signature(s) Signed: 11/26/2015 4:04:04 PM By: [name], [name] Entered By: [name] on 11/26/2015 16:04:04 Mealing, [name] ([id number]) -------------------------------------------------------------------------------- SuperBill Details Patient Name: [name] Date of Service: 11/26/2015 Medical Record Number: [id number] Patient Account Number: [id number] Date of Birth/Sex: 13-Jun-1956 (60 y.o. Female) Treating RN: Cornell Barman Primary Care Physician: [name] Other Clinician: Referring Physician: [name] Treating Physician/Extender: Frann Rider in Treatment: 48 Diagnosis Coding ICD-10 Codes Code Description E11.622 Type 2 diabetes mellitus with other skin ulcer L89.112 Pressure ulcer of right upper back, stage 2 S80.821A Blister (nonthermal), right lower leg, initial encounter N18.6 End stage renal disease Facility Procedures CPT4 Code: [id number] Description: [phone] - WOUND CARE VISIT-LEV 2 EST PT Modifier: Quantity: 1 Physician Procedures CPT4 Code: [id number] Description: [id number] - WC PHYS LEVEL 3 - EST PT ICD-10 Description Diagnosis E11.622 Type 2 diabetes mellitus with other skin ulcer L89.112 Pressure ulcer of right upper back, stage 2 S80.821A Blister (nonthermal), right lower leg, initial enc N18.6 End  stage renal disease Modifier: ounter Quantity: 1 Electronic Signature(s) Signed: 11/26/2015 4:04:19 PM By: [name], [name] Entered By: [name] on 11/26/2015 16:04:18

## 2015-11-27 NOTE — Progress Notes (Signed)
[name], [name] ([id number]) Visit Report for 11/26/2015 Arrival Information Details [name], [name]. Date of Service: 11/26/2015 3:45 PM Medical Record Number: [id number] Patient Account Number: [id number] Date of Birth/Sex: September 02, 1955 (60 y.o. Female) Treating [name]: [name] Primary Care Physician: [name] Other Clinician: Referring Physician: Nicky [name] Treating Physician/Extender: [name] in Treatment: 49 Visit Information History Since Last Visit Added or deleted any medications: No Patient Arrived: Walker Any new allergies or adverse reactions: No Arrival Time: 15:38 Had a fall or experienced change in No Accompanied By: self activities of daily living that may affect Transfer Assistance: None risk of falls: Patient Identification Verified: Yes Signs or symptoms of abuse/neglect since last No Secondary Verification Process Yes visito Completed: Hospitalized since last visit: No Patient Requires Transmission- No Has Dressing in Place as Prescribed: Yes Based Precautions: Pain Present Now: No Patient Has Alerts: Yes Patient Alerts: Patient on Blood Thinner Electronic Signature(s) Signed: 11/26/2015 5:50:20 PM By: [name] Cool, [name], [name], [name], [name] Entered By: [name] Cool, [name], [name], [name] on 11/26/2015 15:38:59 [place], [name] ([id number]) -------------------------------------------------------------------------------- Clinic Level of Care Assessment Details [name]. Date of Service: 11/26/2015 3:45 PM Medical Record Number: [id number] Patient Account Number: [id number] Date of Birth/Sex: [birth year] (60 y.o. Female) Treating [name]: [name] Primary Care Physician: [name] Other Clinician: Referring Physician: Nicky [name] Treating Physician/Extender: [name] in Treatment: 66 Clinic Level of Care Assessment Items TOOL 4 Quantity Score []  - Use when only an EandM is performed on FOLLOW-UP visit 0 ASSESSMENTS -  Nursing Assessment / Reassessment []  - Reassessment of Co-morbidities (includes updates in patient status) 0 X - Reassessment of Adherence to Treatment Plan 1 5 ASSESSMENTS - Wound and Skin Assessment / Reassessment X - Simple Wound Assessment / Reassessment - one wound 1 5 []  - Complex Wound Assessment / Reassessment - multiple wounds 0 []  - Dermatologic / Skin Assessment (not related to wound area) 0 ASSESSMENTS - Focused Assessment []  - Circumferential Edema Measurements - multi extremities 0 []  - Nutritional Assessment / Counseling / Intervention 0 []  - Lower Extremity Assessment (monofilament, tuning fork, pulses) 0 []  - Peripheral Arterial Disease Assessment (using hand held doppler) 0 ASSESSMENTS - Ostomy and/or Continence Assessment and Care []  - Incontinence Assessment and Management 0 []  - Ostomy Care Assessment and Management (repouching, etc.) 0 PROCESS - Coordination of Care X - Simple Patient / Family Education for ongoing care 1 15 []  - Complex (extensive) Patient / Family Education for ongoing care 0 X - Staff obtains Programmer, systems, Records, Test Results / Process Orders 1 10 []  - Staff telephones HHA, Nursing Homes / Clarify orders / etc 0 []  - Routine Transfer to another Facility (non-emergent condition) 0 [name], [name] ([id number]) []  - Routine Hospital Admission (non-emergent condition) 0 []  - New Admissions / Biomedical engineer / Ordering NPWT, Apligraf, etc. 0 []  - Emergency Hospital Admission (emergent condition) 0 X - Simple Discharge Coordination 1 10 []  - Complex (extensive) Discharge Coordination 0 PROCESS - Special Needs []  - Pediatric / Minor Patient Management 0 []  - Isolation Patient Management 0 []  - Hearing / Language / Visual special needs 0 []  - Assessment of Community assistance (transportation, D/C planning, etc.) 0 []  - Additional assistance / Altered mentation 0 []  - Support Surface(s) Assessment (bed, cushion, seat, etc.) 0 INTERVENTIONS -  Wound Cleansing / Measurement X - Simple Wound Cleansing - one wound 1 5 []  - Complex Wound Cleansing - multiple wounds 0 X -  Wound Imaging (photographs - any number of wounds) 1 5 []  - Wound Tracing (instead of photographs) 0 X - Simple Wound Measurement - one wound 1 5 []  - Complex Wound Measurement - multiple wounds 0 INTERVENTIONS - Wound Dressings X - Small Wound Dressing one or multiple wounds 1 10 []  - Medium Wound Dressing one or multiple wounds 0 []  - Large Wound Dressing one or multiple wounds 0 []  - Application of Medications - topical 0 []  - Application of Medications - injection 0 INTERVENTIONS - Miscellaneous []  - External ear exam 0 [place]man, [name] ([id number]) []  - Specimen Collection (cultures, biopsies, blood, body fluids, etc.) 0 []  - Specimen(s) / Culture(s) sent or taken to Lab for analysis 0 []  - Patient Transfer (multiple staff / Harrel Lemon Lift / Similar devices) 0 []  - Simple Staple / Suture removal (25 or less) 0 []  - Complex Staple / Suture removal (26 or more) 0 []  - Hypo / Hyperglycemic Management (close monitor of Blood Glucose) 0 []  - Ankle / Brachial Index (ABI) - do not check if billed separately 0 X - Vital Signs 1 5 Has the patient been seen at the hospital within the last three years: Yes Total Score: 75 Level Of Care: New/Established - Level 2 Electronic Signature(s) Signed: 11/26/2015 5:50:20 PM By: [name] Cool, [name], [name], [name], [name] Entered By: [name] Cool, [name], [name], [name] on 11/26/2015 15:56:55 [name], [name] ([id number]) -------------------------------------------------------------------------------- Encounter Discharge Information Details [name]. Date of Service: 11/26/2015 3:45 PM Medical Record Number: [id number] Patient Account Number: [id number] Date of Birth/Sex: 06/01/1956 (60 y.o. Female) Treating [name]: [name] Primary Care Physician: [name] Other Clinician: Referring Physician: Nicky [name] Treating Physician/Extender:  [name] in Treatment: 18 Encounter Discharge Information Items Discharge Pain Level: 0 Discharge Condition: Stable Ambulatory Status: Walker Discharge Destination: Home Transportation: Private Auto Accompanied By: self Schedule Follow-up Appointment: Yes Medication Reconciliation completed Yes and provided to Patient/Care Quintez Maselli: Provided on Clinical Summary of Care: 11/26/2015 Form Type Recipient Paper Patient LD Electronic Signature(s) Signed: 11/26/2015 3:58:47 PM By: Ruthine Dose Entered By: Ruthine Dose on 11/26/2015 15:58:47 Tessier, [name] ([id number]) -------------------------------------------------------------------------------- Lower Extremity Assessment Details Patient Name: [name], Zinnia S. Date of Service: 11/26/2015 3:45 PM Medical Record Number: [id number] Patient Account Number: [id number] Date of Birth/Sex: Oct 09, 1955 (60 y.o. Female) Treating [name]: [name] Primary Care Physician: [name] Other Clinician: Referring Physician: Nicky [name] Treating Physician/Extender: [name] in Treatment: 48 Vascular Assessment Pulses: Posterior Tibial Dorsalis Pedis Palpable: [Right:Yes] Extremity colors, hair growth, and conditions: Extremity Color: [Right:Normal] Hair Growth on Extremity: [Right:No] Temperature of Extremity: [Right:Warm] Capillary Refill: [Right:< 3 seconds] Electronic Signature(s) Signed: 11/26/2015 5:50:20 PM By: [name] Cool, [name], [name], [name], [name] Entered By: [name] Cool, [name], [name], [name] on 11/26/2015 15:43:04 [name], [name] ([id number]) -------------------------------------------------------------------------------- Multi Wound Chart Details [name]. Date of Service: 11/26/2015 3:45 PM Medical Record Number: [id number] Patient Account Number: [id number] Date of Birth/Sex: 26-Mar-1956 (60 y.o. Female) Treating [name]: [name] Primary Care Physician: [name] Other Clinician: Referring Physician: Nicky [name] Treating Physician/Extender: [name] in Treatment: 48 Vital Signs Height(in): 65 Pulse(bpm): 75 Weight(lbs): 156 Blood Pressure 141/51 (mmHg): Body Mass Index(BMI): 26 Temperature(F): 98.4 Respiratory Rate 18 (breaths/min): Photos: [1:No Photos] [9:No Photos] [N/A:N/A] Wound Location: [1:Right Back] [9:Right Lower Leg] [N/A:N/A] Wounding Event: [1:Blister] [9:Blister] [N/A:N/A] Primary Etiology: [1:Pressure Ulcer] [9:Venous Leg Ulcer] [N/A:N/A] Date Acquired: [1:10/19/2013] [9:11/17/2015] [N/A:N/A] Weeks of Treatment: [1:48] [9:0] [N/A:N/A] Wound Status: [1:Open] [9:Open] [N/A:N/A] Measurements  L x W x D 0.1x0.1x0.1 [9:0x0x0] [N/A:N/A] (cm) Area (cm) : [1:0.008] [9:0] [N/A:N/A] Volume (cm) : [1:0.001] [9:0] [N/A:N/A] % Reduction in Area: [1:100.00%] [9:100.00%] [N/A:N/A] % Reduction in Volume: 100.00% [9:100.00%] [N/A:N/A] Classification: [1:Category/Stage II] [9:Partial Thickness] [N/A:N/A] Periwound Skin Texture: No Abnormalities Noted [9:No Abnormalities Noted] [N/A:N/A] Periwound Skin [1:No Abnormalities Noted] [9:No Abnormalities Noted] [N/A:N/A] Moisture: Periwound Skin Color: No Abnormalities Noted [9:No Abnormalities Noted] [N/A:N/A] Tenderness on [1:No] [9:No] [N/A:N/A] Treatment Notes Electronic Signature(s) Signed: 11/26/2015 5:50:20 PM By: [name] Cool, [name], [name], [name], [name] Entered By: [name] Cool, [name], [name], [name] on 11/26/2015 15:48:07 [name], [name] ([id number]) -------------------------------------------------------------------------------- Mertzon Details Patient Name: AUDRE, VANVORST. Date of Service: 11/26/2015 3:45 PM Medical Record Number: [id number] Patient Account Number: [id number] Date of Birth/Sex: 11/12/1955 (61 y.o. Female) Treating [name]: [name] Primary Care Physician: [name] Other Clinician: Referring Physician: Nicky [name] Treating Physician/Extender: [name] in Treatment: 76 Active  Inactive Abuse / Safety / Falls / Self Care Management Nursing Diagnoses: Potential for falls Goals: Patient will remain injury free Date Initiated: 12/23/2014 Goal Status: Active Patient/caregiver will verbalize understanding of skin care regimen Date Initiated: 12/23/2014 Goal Status: Active Patient/caregiver will verbalize/demonstrate measures taken to prevent injury and/or falls Date Initiated: 12/23/2014 Goal Status: Active Patient/caregiver will verbalize/demonstrate understanding of what to do in case of emergency Date Initiated: 12/23/2014 Goal Status: Active Interventions: Assess fall risk on admission and as needed Assess: immobility, friction, shearing, incontinence upon admission and as needed Assess impairment of mobility on admission and as needed per policy Assess self care needs on admission and as needed Provide education on fall prevention Notes: Wound/Skin Impairment Nursing Diagnoses: Impaired tissue integrity Knowledge deficit related to ulceration/compromised skin integrity Goals: Patient/caregiver will verbalize understanding of skin care regimen GERRIE, LAJAUNIE ([id number]) Date Initiated: 12/23/2014 Goal Status: Active Ulcer/skin breakdown will heal within 14 weeks Date Initiated: 12/23/2014 Goal Status: Active Interventions: Assess patient/caregiver ability to obtain necessary supplies Assess patient/caregiver ability to perform ulcer/skin care regimen upon admission and as needed Assess ulceration(s) every visit Provide education on ulcer and skin care Treatment Activities: Skin care regimen initiated : 11/05/2015 Topical wound management initiated : 11/05/2015 Notes: Electronic Signature(s) Signed: 11/26/2015 5:50:20 PM By: [name] Cool, [name], [name], [name], [name] Entered By: [name] Cool, [name], [name], [name] on 11/26/2015 15:47:56 Clock, [name] ([id number]) -------------------------------------------------------------------------------- Pain Assessment Details Patient Name:  [name]. Date of Service: 11/26/2015 3:45 PM Medical Record Number: [id number] Patient Account Number: [id number] Date of Birth/Sex: [birth year] (60 y.o. Female) Treating [name]: [name] Primary Care Physician: [name] Other Clinician: Referring Physician: Nicky [name] Treating Physician/Extender: [name] in Treatment: 48 Active Problems Location of Pain Severity and Description of Pain Patient Has Paino No Site Locations Pain Management and Medication Current Pain Management: Electronic Signature(s) Signed: 11/26/2015 5:50:20 PM By: [name] Cool, [name], [name], [name], [name] Entered By: [name] Cool, [name], [name], [name] on 11/26/2015 15:41:27 Craker, [name] ([id number]) -------------------------------------------------------------------------------- Patient/Caregiver Education Details [name]. Date of Service: 11/26/2015 3:45 PM Medical Record Number: [id number] Patient Account Number: [id number] Date of Birth/Gender: Jun 30, 1956 (60 y.o. Female) Treating [name]: [name] Primary Care Physician: [name] Other Clinician: Referring Physician: Nicky [name] Treating Physician/Extender: [name] in Treatment: 40 Education Assessment Education Provided To: Patient Education Topics Provided Wound/Skin Impairment: Handouts: Caring for Your Ulcer Methods: Demonstration Responses: State content correctly Electronic Signature(s) Signed: 11/26/2015 5:50:20 PM By: [name] Cool, [name], [name], [name], [name] Entered By: [name] Cool, [name], [name], [name] on 11/26/2015 15:58:02 Steen,  [name] ([id number]) -------------------------------------------------------------------------------- Wound Assessment Details Patient Name: SOLAE, YAFFE. Date of Service: 11/26/2015 3:45 PM Medical Record Number: [id number] Patient Account Number: [id number] Date of Birth/Sex: Dec 01, 1955 (60 y.o. Female) Treating [name]: [name] Primary Care Physician: [name] Other Clinician: Referring Physician:  Nicky [name] Treating Physician/Extender: [name] in Treatment: 74 Wound Status Wound Number: 1 Primary Etiology: Pressure Ulcer Wound Location: Right Back Wound Status: Open Wounding Event: Blister Date Acquired: 10/19/2013 Weeks Of Treatment: 48 Clustered Wound: No Photos Photo Uploaded By: [name] Cool, [name], [name], [name] on 11/26/2015 16:11:13 Wound Measurements Length: (cm) 0.1 Width: (cm) 0.1 Depth: (cm) 0.1 Area: (cm) 0.008 Volume: (cm) 0.001 % Reduction in Area: 100% % Reduction in Volume: 100% Wound Description Classification: Category/Stage II Periwound Skin Texture Texture Color No Abnormalities Noted: No No Abnormalities Noted: No Moisture No Abnormalities Noted: No Treatment Notes Wound #1 (Right Back) 1. Cleansed with: Lomeli, [name] ([id number]) Clean wound with Normal Saline 4. Dressing Applied: Other dressing (specify in notes) Notes cutimed siltec sorbact Electronic Signature(s) Signed: 11/26/2015 5:50:20 PM By: [name] Cool, [name], [name], [name], [name] Entered By: [name] Cool, [name], [name], [name] on 11/26/2015 15:46:38 [place], [name] ([id number]) -------------------------------------------------------------------------------- Wound Assessment Details Patient Name: BREXLEE, MISKIEWICZ. Date of Service: 11/26/2015 3:45 PM Medical Record Number: [id number] Patient Account Number: [id number] Date of Birth/Sex: Jan 08, 1956 (60 y.o. Female) Treating [name]: [name] Primary Care Physician: [name] Other Clinician: Referring Physician: Nicky [name] Treating Physician/Extender: [name] in Treatment: 13 Wound Status Wound Number: 9 Primary Etiology: Venous Leg Ulcer Wound Location: Right Lower Leg Wound Status: Open Wounding Event: Blister Date Acquired: 11/17/2015 Weeks Of Treatment: 0 Clustered Wound: No Photos Photo Uploaded By: [name] Cool, [name], [name], [name] on 11/26/2015 16:11:13 Wound Measurements Length: (cm) 0 Width: (cm) 0 Depth: (cm) 0 Area: (cm)  0 Volume: (cm) 0 % Reduction in Area: 100% % Reduction in Volume: 100% Wound Description Classification: Partial Thickness Periwound Skin Texture Texture Color No Abnormalities Noted: No No Abnormalities Noted: No Moisture No Abnormalities Noted: No Electronic Signature(s) Signed: 11/26/2015 5:50:20 PM By: [name] Cool, [name], [name], [name], [name] Offner, Madalen Chauncey Cruel ([id number]) Entered By: [name] Cool, [name], [name], [name] on 11/26/2015 15:46:38 Shedden, [name] ([id number]) -------------------------------------------------------------------------------- Vitals Details [name]. Date of Service: 11/26/2015 3:45 PM Medical Record Number: [id number] Patient Account Number: [id number] Date of Birth/Sex: [birth year] (60 y.o. Female) Treating [name]: [name] Primary Care Physician: [name] Other Clinician: Referring Physician: Nicky [name] Treating Physician/Extender: [name] in Treatment: 48 Vital Signs Time Taken: 15:41 Temperature (F): 98.4 Height (in): 65 Pulse (bpm): 75 Weight (lbs): 156 Respiratory Rate (breaths/min): 18 Body Mass Index (BMI): 26 Blood Pressure (mmHg): 141/51 Reference Range: 80 - 120 mg / dl Electronic Signature(s) Signed: 11/26/2015 5:50:20 PM By: [name] Cool, [name], [name], [name], [name] Entered By: [name] Cool, [name], [name], [name] on 11/26/2015 15:42:05

## 2015-12-03 ENCOUNTER — Encounter: Payer: Medicare Other | Admitting: Surgery

## 2015-12-03 DIAGNOSIS — E11622 Type 2 diabetes mellitus with other skin ulcer: Secondary | ICD-10-CM | POA: Diagnosis not present

## 2015-12-03 NOTE — Progress Notes (Addendum)
[name], [name] ([id number]) Visit Report for 12/03/2015 Arrival Information Details [name], [name]. Date of Service: 12/03/2015 2:15 PM Medical Record Number: [id number] Patient Account Number: [id number] Date of Birth/Sex: 1955-11-09 (60 y.o. Female) Treating RN: [name], RN, [name], [name] Primary Care Physician: [name] Other Clinician: Referring Physician: [name] Treating Physician/Extender: Frann Rider in Treatment: 64 Visit Information History Since Last Visit Added or deleted any medications: No Patient Arrived: Walker Any new allergies or adverse reactions: No Arrival Time: 14:13 Had a fall or experienced change in No Accompanied By: self activities of daily living that may affect Transfer Assistance: None risk of falls: Patient Identification Verified: Yes Signs or symptoms of abuse/neglect since last No Secondary Verification Process Yes visito Completed: Hospitalized since last visit: No Patient Requires Transmission- No Has Dressing in Place as Prescribed: Yes Based Precautions: Pain Present Now: No Patient Has Alerts: Yes Patient Alerts: Patient on Blood Thinner Electronic Signature(s) Signed: 12/03/2015 2:13:56 PM By: Regan Lemming [name], RN Entered By: Regan Lemming on 12/03/2015 14:13:56 [name], [name] Grief ([id number]) -------------------------------------------------------------------------------- Clinic Level of Care Assessment Details [name]. Date of Service: 12/03/2015 2:15 PM Medical Record Number: [id number] Patient Account Number: [id number] Date of Birth/Sex: Oct 22, 1955 (60 y.o. Female) Treating RN: [name], RN, [name], [name] Primary Care Physician: [name] Other Clinician: Referring Physician: [name] Treating Physician/Extender: Frann Rider in Treatment: 77 Clinic Level of Care Assessment Items TOOL 4 Quantity Score []  - Use when only an EandM is performed on FOLLOW-UP visit 0 ASSESSMENTS -  Nursing Assessment / Reassessment X - Reassessment of Co-morbidities (includes updates in patient status) 1 10 X - Reassessment of Adherence to Treatment Plan 1 5 ASSESSMENTS - Wound and Skin Assessment / Reassessment X - Simple Wound Assessment / Reassessment - one wound 1 5 []  - Complex Wound Assessment / Reassessment - multiple wounds 0 []  - Dermatologic / Skin Assessment (not related to wound area) 0 ASSESSMENTS - Focused Assessment []  - Circumferential Edema Measurements - multi extremities 0 []  - Nutritional Assessment / Counseling / Intervention 0 []  - Lower Extremity Assessment (monofilament, tuning fork, pulses) 0 []  - Peripheral Arterial Disease Assessment (using hand held doppler) 0 ASSESSMENTS - Ostomy and/or Continence Assessment and Care []  - Incontinence Assessment and Management 0 []  - Ostomy Care Assessment and Management (repouching, etc.) 0 PROCESS - Coordination of Care X - Simple Patient / Family Education for ongoing care 1 15 []  - Complex (extensive) Patient / Family Education for ongoing care 0 []  - Staff obtains Programmer, systems, Records, Test Results / Process Orders 0 []  - Staff telephones HHA, Nursing Homes / Clarify orders / etc 0 []  - Routine Transfer to another Facility (non-emergent condition) 0 Earhart, Starnisha S. ([id number]) []  - Routine Hospital Admission (non-emergent condition) 0 []  - New Admissions / Biomedical engineer / Ordering NPWT, Apligraf, etc. 0 []  - Emergency Hospital Admission (emergent condition) 0 []  - Simple Discharge Coordination 0 []  - Complex (extensive) Discharge Coordination 0 PROCESS - Special Needs []  - Pediatric / Minor Patient Management 0 []  - Isolation Patient Management 0 []  - Hearing / Language / Visual special needs 0 []  - Assessment of Community assistance (transportation, D/C planning, etc.) 0 []  - Additional assistance / Altered mentation 0 []  - Support Surface(s) Assessment (bed, cushion, seat, etc.) 0 INTERVENTIONS -  Wound Cleansing / Measurement X - Simple Wound Cleansing - one wound 1 5 []  - Complex Wound Cleansing - multiple wounds 0 X - Wound  Imaging (photographs - any number of wounds) 1 5 []  - Wound Tracing (instead of photographs) 0 X - Simple Wound Measurement - one wound 1 5 []  - Complex Wound Measurement - multiple wounds 0 INTERVENTIONS - Wound Dressings X - Small Wound Dressing one or multiple wounds 1 10 []  - Medium Wound Dressing one or multiple wounds 0 []  - Large Wound Dressing one or multiple wounds 0 []  - Application of Medications - topical 0 []  - Application of Medications - injection 0 INTERVENTIONS - Miscellaneous []  - External ear exam 0 [name], Virgil S. ([id number]) []  - Specimen Collection (cultures, biopsies, blood, body fluids, etc.) 0 []  - Specimen(s) / Culture(s) sent or taken to Lab for analysis 0 []  - Patient Transfer (multiple staff / Harrel Lemon Lift / Similar devices) 0 []  - Simple Staple / Suture removal (25 or less) 0 []  - Complex Staple / Suture removal (26 or more) 0 []  - Hypo / Hyperglycemic Management (close monitor of Blood Glucose) 0 []  - Ankle / Brachial Index (ABI) - do not check if billed separately 0 X - Vital Signs 1 5 Has the patient been seen at the hospital within the last three years: Yes Total Score: 65 Level Of Care: New/Established - Level 2 Electronic Signature(s) Signed: 12/03/2015 3:29:37 PM By: Regan Lemming [name], RN Entered By: Regan Lemming on 12/03/2015 14:24:25 Kempker, [name] Grief ([id number]) -------------------------------------------------------------------------------- Encounter Discharge Information Details [name]. Date of Service: 12/03/2015 2:15 PM Medical Record Number: [id number] Patient Account Number: [id number] Date of Birth/Sex: 1955-08-29 (60 y.o. Female) Treating RN: Baruch Gouty, RN, [name], [name] Primary Care Physician: [name] Other Clinician: Referring Physician: [name] Treating Physician/Extender: Frann Rider in Treatment: 16 Encounter Discharge Information Items Discharge Pain Level: 0 Discharge Condition: Stable Ambulatory Status: Walker Discharge Destination: Home Transportation: Other Accompanied By: self Schedule Follow-up Appointment: No Medication Reconciliation completed No and provided to Patient/Care Kathyjo Briere: Provided on Clinical Summary of Care: 12/03/2015 Form Type Recipient Paper Patient LD Electronic Signature(s) Signed: 12/03/2015 2:35:47 PM By: Regan Lemming [name], RN Previous Signature: 12/03/2015 2:27:56 PM Version By: Ruthine Dose Entered By: Regan Lemming on 12/03/2015 14:35:46 Bonsall, [name] Grief ([id number]) -------------------------------------------------------------------------------- Multi Wound Chart Details [name]. Date of Service: 12/03/2015 2:15 PM Medical Record Number: [id number] Patient Account Number: [id number] Date of Birth/Sex: [birth year] (60 y.o. Female) Treating RN: Baruch Gouty, RN, [name], [name] Primary Care Physician: [name] Other Clinician: Referring Physician: [name] Treating Physician/Extender: Frann Rider in Treatment: 49 Vital Signs Height(in): 65 Pulse(bpm): 84 Weight(lbs): 156 Blood Pressure 150/63 (mmHg): Body Mass Index(BMI): 26 Temperature(F): 98.3 Respiratory Rate 18 (breaths/min): Photos: [1:No Photos] [N/A:N/A] Wound Location: [1:Right Back] [N/A:N/A] Wounding Event: [1:Blister] [N/A:N/A] Primary Etiology: [1:Pressure Ulcer] [N/A:N/A] Comorbid History: [1:Glaucoma, Anemia, Coronary Artery Disease, Hypertension, Type II Diabetes, End Stage Renal Disease, Osteoarthritis, Neuropathy] [N/A:N/A] Date Acquired: [1:10/19/2013] [N/A:N/A] Weeks of Treatment: [1:49] [N/A:N/A] Wound Status: [1:Open] [N/A:N/A] Measurements L x W x D 1.5x0.5x0.1 [N/A:N/A] (cm) Area (cm) : [1:0.589] [N/A:N/A] Volume (cm) : [1:0.059] [N/A:N/A] % Reduction in Area: [1:98.50%] [N/A:N/A] % Reduction in Volume:  98.50% [N/A:N/A] Classification: [1:Category/Stage II] [N/A:N/A] Exudate Amount: [1:Medium] [N/A:N/A] Exudate Type: [1:Serosanguineous] [N/A:N/A] Exudate Color: [1:red, brown] [N/A:N/A] Wound Margin: [1:Distinct, outline attached N/A] Granulation Amount: [1:Large (67-100%)] [N/A:N/A] Granulation Quality: [1:Pink, Pale] [N/A:N/A] Necrotic Amount: [1:None Present (0%)] [N/A:N/A] Exposed Structures: [1:Fascia: No Fat: No Tendon: No] [N/A:N/A] Muscle: No Joint: No Bone: No Limited to Skin Breakdown Epithelialization: None N/A N/A Periwound Skin Texture: Edema: No  N/A N/A Excoriation: No Induration: No Callus: No Crepitus: No Fluctuance: No Friable: No Rash: No Scarring: No Periwound Skin Moist: Yes N/A N/A Moisture: Maceration: No Dry/Scaly: No Periwound Skin Color: Atrophie Blanche: No N/A N/A Cyanosis: No Ecchymosis: No Erythema: No Hemosiderin Staining: No Mottled: No Pallor: No Rubor: No Temperature: No Abnormality N/A N/A Tenderness on No N/A N/A Palpation: Wound Preparation: Ulcer Cleansing: N/A N/A Rinsed/Irrigated with Saline Topical Anesthetic Applied: None Treatment Notes Electronic Signature(s) Signed: 12/03/2015 3:29:37 PM By: Regan Lemming [name], RN Entered By: Regan Lemming on 12/03/2015 14:23:13 Arakelian, [name] Grief ([id number]) -------------------------------------------------------------------------------- [place] Details [name]. Date of Service: 12/03/2015 2:15 PM Medical Record Number: [id number] Patient Account Number: [id number] Date of Birth/Sex: 10-Jul-1956 (60 y.o. Female) Treating RN: [name], RN, [name], [name] Primary Care Physician: [name] Other Clinician: Referring Physician: [name] Treating Physician/Extender: Frann Rider in Treatment: 33 Active Inactive Abuse / Safety / Falls / Self Care Management Nursing Diagnoses: Potential for falls Goals: Patient will remain injury free Date  Initiated: 12/23/2014 Goal Status: Active Patient/caregiver will verbalize understanding of skin care regimen Date Initiated: 12/23/2014 Goal Status: Active Patient/caregiver will verbalize/demonstrate measures taken to prevent injury and/or falls Date Initiated: 12/23/2014 Goal Status: Active Patient/caregiver will verbalize/demonstrate understanding of what to do in case of emergency Date Initiated: 12/23/2014 Goal Status: Active Interventions: Assess fall risk on admission and as needed Assess: immobility, friction, shearing, incontinence upon admission and as needed Assess impairment of mobility on admission and as needed per policy Assess self care needs on admission and as needed Provide education on fall prevention Notes: Wound/Skin Impairment Nursing Diagnoses: Impaired tissue integrity Knowledge deficit related to ulceration/compromised skin integrity Goals: Patient/caregiver will verbalize understanding of skin care regimen SHANIQUE, SVEUM ([id number]) Date Initiated: 12/23/2014 Goal Status: Active Ulcer/skin breakdown will heal within 14 weeks Date Initiated: 12/23/2014 Goal Status: Active Interventions: Assess patient/caregiver ability to obtain necessary supplies Assess patient/caregiver ability to perform ulcer/skin care regimen upon admission and as needed Assess ulceration(s) every visit Provide education on ulcer and skin care Treatment Activities: Skin care regimen initiated : 11/05/2015 Topical wound management initiated : 11/05/2015 Notes: Electronic Signature(s) Signed: 12/03/2015 3:29:37 PM By: Regan Lemming [name], RN Entered By: Regan Lemming on 12/03/2015 14:23:04 Maack, [name] Grief ([id number]) -------------------------------------------------------------------------------- Pain Assessment Details [name]. Date of Service: 12/03/2015 2:15 PM Medical Record Number: [id number] Patient Account Number: [id number] Date of Birth/Sex: 16-Oct-1955 (60 y.o.  Female) Treating RN: Baruch Gouty, RN, [name], [name] Primary Care Physician: [name] Other Clinician: Referring Physician: [name] Treating Physician/Extender: Frann Rider in Treatment: 19 Active Problems Location of Pain Severity and Description of Pain Patient Has Paino No Site Locations Pain Management and Medication Current Pain Management: Electronic Signature(s) Signed: 12/03/2015 2:14:05 PM By: Regan Lemming [name], RN Entered By: Regan Lemming on 12/03/2015 14:14:05 Kronick, [name] Grief ([id number]) -------------------------------------------------------------------------------- Patient/Caregiver Education Details [name]. Date of Service: 12/03/2015 2:15 PM Medical Record Number: [id number] Patient Account Number: [id number] Date of Birth/Gender: [birth year] (60 y.o. Female) Treating RN: Baruch Gouty, RN, [name], [name] Primary Care Physician: [name] Other Clinician: Referring Physician: [name] Treating Physician/Extender: Frann Rider in Treatment: 73 Education Assessment Education Provided To: Patient Education Topics Provided Safety: Methods: Explain/Verbal Responses: State content correctly Wound/Skin Impairment: Methods: Explain/Verbal Responses: State content correctly Electronic Signature(s) Signed: 12/03/2015 2:36:03 PM By: Regan Lemming [name], RN Entered By: Regan Lemming on 12/03/2015 14:36:03 Cadotte, [name] Grief ([id number]) -------------------------------------------------------------------------------- Wound Assessment Details Patient  Name: Strebeck, Stepahnie S. Date of Service: 12/03/2015 2:15 PM Medical Record Number: [id number] Patient Account Number: [id number] Date of Birth/Sex: 25-May-1956 (60 y.o. Female) Treating RN: [name], RN, [name], [name] Primary Care Physician: [name] Other Clinician: Referring Physician: [name] Treating Physician/Extender: Frann Rider in Treatment: 92 Wound Status Wound Number: 1 Primary  Pressure Ulcer Etiology: Wound Location: Right Back Wound Open Wounding Event: Blister Status: Date Acquired: 10/19/2013 Comorbid Glaucoma, Anemia, Coronary Artery Weeks Of Treatment: 49 History: Disease, Hypertension, Type II Clustered Wound: No Diabetes, End Stage Renal Disease, Osteoarthritis, Neuropathy Photos Photo Uploaded By: Regan Lemming on 12/03/2015 14:41:54 Wound Measurements Length: (cm) 1.5 Width: (cm) 0.5 Depth: (cm) 0.1 Area: (cm) 0.589 Volume: (cm) 0.059 % Reduction in Area: 98.5% % Reduction in Volume: 98.5% Epithelialization: None Tunneling: No Undermining: No Wound Description Classification: Category/Stage II Wound Margin: Distinct, outline attached Exudate Amount: Medium Exudate Type: Serosanguineous Exudate Color: red, brown Foul Odor After Cleansing: No Wound Bed Granulation Amount: Large (67-100%) Exposed Structure Granulation Quality: Pink, Pale Fascia Exposed: No Necrotic Amount: None Present (0%) Fat Layer Exposed: No Manahan, Milly S. ([id number]) Tendon Exposed: No Muscle Exposed: No Joint Exposed: No Bone Exposed: No Limited to Skin Breakdown Periwound Skin Texture Texture Color No Abnormalities Noted: No No Abnormalities Noted: No Callus: No Atrophie Blanche: No Crepitus: No Cyanosis: No Excoriation: No Ecchymosis: No Fluctuance: No Erythema: No Friable: No Hemosiderin Staining: No Induration: No Mottled: No Localized Edema: No Pallor: No Rash: No Rubor: No Scarring: No Temperature / Pain Moisture Temperature: No Abnormality No Abnormalities Noted: No Dry / Scaly: No Maceration: No Moist: Yes Wound Preparation Ulcer Cleansing: Rinsed/Irrigated with Saline Topical Anesthetic Applied: None Treatment Notes Wound #1 (Right Back) 1. Cleansed with: Clean wound with Normal Saline 4. Dressing Applied: Other dressing (specify in notes) Notes cutimed siltec sorbact Electronic Signature(s) Signed: 12/03/2015 3:29:37  PM By: Regan Lemming [name], RN Entered By: Regan Lemming on 12/03/2015 14:21:16 Grillo, [name] Grief ([id number]) -------------------------------------------------------------------------------- Vitals Details [name]. Date of Service: 12/03/2015 2:15 PM Medical Record Number: [id number] Patient Account Number: [id number] Date of Birth/Sex: 1956/06/14 (60 y.o. Female) Treating RN: [name], RN, [name], [name] Primary Care Physician: [name] Other Clinician: Referring Physician: [name] Treating Physician/Extender: Frann Rider in Treatment: 66 Vital Signs Time Taken: 14:16 Temperature (F): 98.3 Height (in): 65 Pulse (bpm): 84 Weight (lbs): 156 Respiratory Rate (breaths/min): 18 Body Mass Index (BMI): 26 Blood Pressure (mmHg): 150/63 Reference Range: 80 - 120 mg / dl Electronic Signature(s) Signed: 12/03/2015 3:29:37 PM By: Regan Lemming [name], RN Entered By: Regan Lemming on 12/03/2015 14:18:07

## 2015-12-04 NOTE — Progress Notes (Signed)
[name], [name] ([id number]) Visit Report for 12/03/2015 Chief Complaint Document Details [name], [name]. Date of Service: 12/03/2015 2:15 PM Medical Record Patient Account Number: [id number] Number: [name], RN, [name], Treating RN: Apr 08, 1956 (60 y.o. [name] Date of Birth/Sex: Female) Other Clinician: Primary Care Physician: [name], [name] Treating [name] Referring Physician: Nicky [name] Physician/Extender: [name] in Treatment: 49 Information Obtained from: Patient Chief Complaint Patient presents to the wound care center for a consult due non healing wound. She has an open wound on her right upper back which she's had for about a year and she recently noticed a blister on her right lower extremity about 2 [name] ago. Electronic Signature(s) Signed: 12/03/2015 2:25:54 PM By: [name] Fudge MD, [name] Entered By: [name] on 12/03/2015 14:25:54 [name], [name] ([id number]) -------------------------------------------------------------------------------- HPI Details [name], [name]. Date of Service: 12/03/2015 2:15 PM Medical Record Patient Account Number: [id number] Number: [name], RN, [name], Treating RN: [birth year] (60 y.o. [name] Date of Birth/Sex: Female) Other Clinician: Primary Care Physician: [name], [name] Treating [name] Referring Physician: Nicky [name] Physician/Extender: [name] in Treatment: 72 History of Present Illness Location: right upper back and right lower extremity wounds Quality: Patient reports No Pain. Severity: Patient states wound (s) are getting better. Duration: Patient has had the wound for > 3 months prior to seeking treatment at the wound center Timing: she thought it first occurred when she was using a heating pad about a year ago after back surgery. Context: The wound appeared gradually over time Modifying Factors: Patient is currently on renal dialysis and receives treatments 3 times  weekly Associated Signs and Symptoms: Patient reports having: surgery scheduled for this week for a AV fistula left arm. HPI Description: 60 year old patient who is known to be a diabetic and has end-stage renal disease has had several comorbidities including coronary artery disease, hypertension, hyperlipidemia, pancreatitis, anemia, previous history of hysterectomy, cholecystectomy, left-sided salivary gland excision, bilateral cataract surgery,Peritoneal dialysis catheter, hemodialysis catheter. the area on the back has also been caused by instant pressure she used to sleep on a recliner all day and has significant kyphoscoliosis. As far as the wound on her right lower extremity she's not sure how this blister occurred but she thought it has been there for about 2 [name]. No recent blood investigations available and no recent hemoglobin A1c. 12/30/2014 -- she is an assisted living facility but I believe the nurses that have not followed instructions as she had some cream applied on her back and there was a different dressing. Last week she's had a AV fistula placed on her left forearm. 01/13/2015 -- she has had some localized infection at the port site and she's been on doxycycline for this. 02/05/2015 - he has developed a small blister on her right lower extremity. 02/10/2015 -- she has developed another small blister on her right anterior chest wall in the area where she's had tape for her dialysis access. this may just be injury caused by a tape burn. 02/18/2015 -- no new blisters and she had a dermatology opinion and they have taken a biopsy of her skin. She also had a left brachial AV fistula placed this week. 03/03/2015 -- though we do not have the pathology report yet the patient says she has been put on prednisone because this skin disease is possibly to do with her immune system and her dermatologist is recommended this. 03/10/2015 -- she has developed a new blister which is quite  large on her right lower  extremity on the shin. 05/22/2015 -- she did very well with her dressing but on trying to remove the [place] it peels away [name], [name] ([id number]) the newly healed skin. 06/05/2015 -- the patient was in the ER this past week for severe bleeding from the right nostril and had to have ENT see her and do a packing. They've also been holding her heparin during her dialysis. The patient is going back to ENT today. Other than that she has had no other significant issues. 06/19/2015 -- she is back on her blood thinners and continues to have her hemodialysis as before. 07/31/2015 -- a large blister has popped up again on her right lower extremity in the same place in the mid shin area in the anterior lateral compartment. 10/05/2015 -- last week she was admitted to the hospital for 4 days as she had a pneumonia and was treated with IV and oral antibiotics. Electronic Signature(s) Signed: 12/03/2015 2:25:59 PM By: [name] Fudge MD, [name] Entered By: [name] on 12/03/2015 14:25:59 [name], [name] ([id number]) -------------------------------------------------------------------------------- Physical Exam Details [name], [name]. Date of Service: 12/03/2015 2:15 PM Medical Record Patient Account Number: [id number] Number: [name], RN, [name], Treating RN: 26-May-1956 (60 y.o. [name] Date of Birth/Sex: Female) Other Clinician: Primary Care Physician: [name], [name] Treating [name] Referring Physician: Nicky [name] Physician/Extender: [name] in Treatment: 72 Constitutional . Pulse regular. Respirations normal and unlabored. Afebrile. . Eyes Nonicteric. Reactive to light. Ears, Nose, Mouth, and Throat Lips, teeth, and gums WNL.Marland Kitchen Moist mucosa without lesions. Neck supple and nontender. No palpable supraclavicular or cervical adenopathy. Normal sized without goiter. Respiratory WNL. No retractions.. Cardiovascular Pedal Pulses WNL. No  clubbing, cyanosis or edema. Gastrointestinal (GI) Abdomen without masses or tenderness.. No liver or spleen enlargement or tenderness.. Genitourinary (GU) No hydrocele, spermatocele, tenderness of the cord, or testicular mass.Marland Kitchen Penis without lesions.Lowella Fairy without lesions. No cystocele, or rectocele. Pelvic support intact, no discharge.Marland Kitchen Urethra without masses, tenderness or scarring.Marland Kitchen Lymphatic No adneopathy. No adenopathy. No adenopathy. Musculoskeletal Adexa without tenderness or enlargement.. Digits and nails w/o clubbing, cyanosis, infection, petechiae, ischemia, or inflammatory conditions.. Integumentary (Hair, Skin) No suspicious lesions. No crepitus or fluctuance. No peri-wound warmth or erythema. No masses.Marland Kitchen Psychiatric Judgement and insight Intact.. No evidence of depression, anxiety, or agitation.. Notes the scar is continuing to have a few open areas which are superficial ulcerations which start of red blebs and then become superficial ulcers. No debridement was required today. Electronic Signature(s) [name], [name] ([id number]) Signed: 12/03/2015 2:26:42 PM By: [name] Fudge MD, [name] Entered By: [name] on 12/03/2015 14:26:42 [place], [name] ([id number]) -------------------------------------------------------------------------------- Physician Orders Details Patient Name: KEMORIA, MACFADYEN. Date of Service: 12/03/2015 2:15 PM Medical Record Patient Account Number: [id number] Number: [name], RN, [name], Treating RN: 10/08/1955 (60 y.o. [name] Date of Birth/Sex: Female) Other Clinician: Primary Care Physician: [name], [name] Treating Brandn Mcgath Referring Physician: Nicky [name] Physician/Extender: Suella Grove in Treatment: 38 Verbal / Phone Orders: Yes Clinician: [name], RN, [name], [name] Read Back and Verified: Yes Diagnosis Coding Wound Cleansing Wound #1 Right Back o Clean wound with Normal Saline. Skin Barriers/Peri-Wound Care Wound #1 Right Back o  Skin Prep Primary Wound Dressing Wound #1 Right Back o Other: - Siltec Sorbact - SNF RN may change dressing if needed as drainage pass white border. Extra piece given to patient. Dressing Change Frequency Wound #1 Right Back o Change dressing every week - unless saturated. Gently pull of old dressing due to patient thin epetheilized  skin and may wet with saline if needed. Follow-up Appointments Wound #1 Right Back o Return Appointment in 2 [name]. Electronic Signature(s) Signed: 12/03/2015 3:29:37 PM By: Regan Lemming [name], RN Signed: 12/03/2015 3:55:58 PM By: [name] Fudge MD, [name] Entered By: Regan Lemming on 12/03/2015 14:27:07 [place], [name] ([id number]) -------------------------------------------------------------------------------- Problem List Details [name], [name]. Date of Service: 12/03/2015 2:15 PM Medical Record Patient Account Number: [id number] Number: [name], RN, [name], Treating RN: 08-Mar-1956 (60 y.o. 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 Date of Service: 12/03/2015 2:15 PM Medical Record Patient Account Number: [id number] Number:  [name], RN, [name], Treating RN: [birth year] (60 y.o. [name] Date of Birth/Sex: Female) Other Clinician: Primary Care Physician: [name], [name] Treating Wanona Stare Referring Physician: Nicky [name] Physician/Extender: [name] in Treatment: 32 Subjective Chief Complaint Information obtained from Patient Patient presents to the wound care center for a consult due non healing wound. She has an open wound on her right upper back which she's had for about a year and she recently noticed a blister on her right lower extremity about 2 [name] ago. History of Present Illness (HPI) The following HPI elements were documented for the patient's wound: Location: right upper back and right lower extremity wounds Quality: Patient reports No Pain. Severity: Patient states wound (s) are getting better. Duration: Patient has had the wound for > 3 months prior to seeking treatment at the wound center Timing: she thought it first occurred when she was using a heating pad about a year ago after back surgery. Context: The wound appeared gradually over time Modifying Factors: Patient is currently on renal dialysis and receives treatments 3 times weekly Associated Signs and Symptoms: Patient reports having: surgery scheduled for this week for a AV fistula left arm. 60 year old patient who is known to be a diabetic and has end-stage renal disease has had several comorbidities including coronary artery disease, hypertension, hyperlipidemia, pancreatitis, anemia, previous history of hysterectomy, cholecystectomy, left-sided salivary gland excision, bilateral cataract surgery,Peritoneal dialysis catheter, hemodialysis catheter. the area on the back has also been caused by instant pressure she used to sleep on a recliner all day and has significant kyphoscoliosis. As far as the wound on her right lower extremity she's not sure how this blister occurred but she thought it has been there for about 2 [name]. No recent  blood investigations available and no recent hemoglobin A1c. 12/30/2014 -- she is an assisted living facility but I believe the nurses that have not followed instructions as she had some cream applied on her back and there was a different dressing. Last week she's had a AV fistula placed on her left forearm. 01/13/2015 -- she has had some localized infection at the port site and she's been on doxycycline for this. 02/05/2015 - he has developed a small blister on her right lower extremity. 02/10/2015 -- she has developed another small blister on her right anterior chest wall  in the area where she's had tape for her dialysis access. this may just be injury caused by a tape burn. [name], [name] ([id number]) 02/18/2015 -- no new blisters and she had a dermatology opinion and they have taken a biopsy of her skin. She also had a left brachial AV fistula placed this week. 03/03/2015 -- though we do not have the pathology report yet the patient says she has been put on prednisone because this skin disease is possibly to do with her immune system and her dermatologist is recommended this. 03/10/2015 -- she has developed a new blister which is quite large on her right lower extremity on the shin. 05/22/2015 -- she did very well with her dressing but on trying to remove the [place] it peels away the newly healed skin. 06/05/2015 -- the patient was in the ER this past week for severe bleeding from the right nostril and had to have ENT see her and do a packing. They've also been holding her heparin during her dialysis. The patient is going back to ENT today. Other than that she has had no other significant issues. 06/19/2015 -- she is back on her blood thinners and continues to have her hemodialysis as before. 07/31/2015 -- a large blister has popped up again on her right lower extremity in the same place in the mid shin area in the anterior lateral compartment. 10/05/2015 -- last week she was  admitted to the hospital for 4 days as she had a pneumonia and was treated with IV and oral antibiotics. Objective Constitutional Pulse regular. Respirations normal and unlabored. Afebrile. Vitals Time Taken: 2:16 PM, Height: 65 in, Weight: 156 lbs, BMI: 26, Temperature: 98.3 F, Pulse: 84 bpm, Respiratory Rate: 18 breaths/min, Blood Pressure: 150/63 mmHg. Eyes Nonicteric. Reactive to light. Ears, Nose, Mouth, and Throat Lips, teeth, and gums WNL.Marland Kitchen Moist mucosa without lesions. Neck supple and nontender. No palpable supraclavicular or cervical adenopathy. Normal sized without goiter. Respiratory WNL. No retractions.. Cardiovascular Pedal Pulses WNL. No clubbing, cyanosis or edema. [name] ([id number]) Gastrointestinal (GI) Abdomen without masses or tenderness.. No liver or spleen enlargement or tenderness.. Genitourinary (GU) No hydrocele, spermatocele, tenderness of the cord, or testicular mass.Marland Kitchen Penis without lesions.Lowella Fairy without lesions. No cystocele, or rectocele. Pelvic support intact, no discharge.Marland Kitchen Urethra without masses, tenderness or scarring.Marland Kitchen Lymphatic No adneopathy. No adenopathy. No adenopathy. Musculoskeletal Adexa without tenderness or enlargement.. Digits and nails w/o clubbing, cyanosis, infection, petechiae, ischemia, or inflammatory conditions.Marland Kitchen Psychiatric Judgement and insight Intact.. No evidence of depression, anxiety, or agitation.. General Notes: the scar is continuing to have a few open areas which are superficial ulcerations which start of red blebs and then become superficial ulcers. No debridement was required today. Integumentary (Hair, Skin) No suspicious lesions. No crepitus or fluctuance. No peri-wound warmth or erythema. No masses.. Wound #1 status is Open. Original cause of wound was Blister. The wound is located on the Right Back. The wound measures 1.5cm length x 0.5cm width x 0.1cm depth; 0.589cm^2 area and 0.059cm^3  volume. The wound is limited to skin breakdown. There is no tunneling or undermining noted. There is a medium amount of serosanguineous drainage noted. The wound margin is distinct with the outline attached to the wound base. There is large (67-100%) pink, pale granulation within the wound bed. There is no necrotic tissue within the wound bed. The periwound skin appearance exhibited: Moist. The periwound skin appearance did not exhibit: Callus, Crepitus, Excoriation, Fluctuance, Friable, Induration, Localized Edema, Rash, Scarring, Dry/Scaly,  Maceration, Atrophie Blanche, Cyanosis, Ecchymosis, Hemosiderin Staining, Mottled, Pallor, Rubor, Erythema. Periwound temperature was noted as No Abnormality. Assessment Active Problems ICD-10 E11.622 - Type 2 diabetes mellitus with other skin ulcer L89.112 - Pressure ulcer of right upper back, stage 2 S80.821A - Blister (nonthermal), right lower leg, initial encounter N18.6 - End stage renal disease [name], [name] ([id number]) Plan Wound Cleansing: Wound #1 Right Back: Clean wound with Normal Saline. Skin Barriers/Peri-Wound Care: Wound #1 Right Back: Skin Prep Primary Wound Dressing: Wound #1 Right Back: Other: - Siltec Sorbact - SNF RN may change dressing if needed as drainage pass white border. Extra piece given to patient. Dressing Change Frequency: Wound #1 Right Back: Change dressing every week - unless saturated. Gently pull of old dressing due to patient thin epetheilized skin and may wet with saline if needed. Follow-up Appointments: Wound #1 Right Back: Return Appointment in 2 [name]. I have recommended we protect the supple scar with Siltec Sorbact foam for another week and if she remains healed we will discharge her from the wound care services. Electronic Signature(s) Signed: 12/03/2015 3:01:02 PM By: [name] Fudge MD, [name] Previous Signature: 12/03/2015 2:26:51 PM Version By: [name] Fudge MD, [name] Entered By: [name] on  12/03/2015 15:01:02 [place]and, [name] ([id number]) -------------------------------------------------------------------------------- SuperBill Details [name]. Date of Service: 12/03/2015 Medical Record Patient Account Number: [id number] Number: [name], RN, [name], Treating RN: 07/24/1955 [id number] y.o. [name] Date of Birth/Sex: Female) Other Clinician: Primary Care Physician: [name] Treating Dimetri Armitage Referring Physician: Nicky [name] Physician/Extender: [name] in Treatment: 49 Diagnosis Coding ICD-10 Codes Code Description E11.622 Type 2 diabetes mellitus with other skin ulcer L89.112 Pressure ulcer of right upper back, stage 2 S80.821A Blister (nonthermal), right lower leg, initial encounter N18.6 End stage renal disease Facility Procedures CPT4 Code: [id number] Description: [id number] - WOUND CARE VISIT-LEV 2 EST PT Modifier: Quantity: 1 Physician Procedures CPT4 Code: [id number] Description: [id number] - WC PHYS LEVEL 3 - EST PT ICD-10 Description Diagnosis E11.622 Type 2 diabetes mellitus with other skin ulcer L89.112 Pressure ulcer of right upper back, stage 2 S80.821A Blister (nonthermal), right lower leg, initial enc N18.6 End  stage renal disease Modifier: ounter Quantity: 1 Electronic Signature(s) Signed: 12/03/2015 2:27:05 PM By: [name] Fudge MD, [name] Entered By: [name] on 12/03/2015 14:27:05

## 2015-12-07 ENCOUNTER — Other Ambulatory Visit: Payer: Self-pay | Admitting: Internal Medicine

## 2015-12-07 ENCOUNTER — Ambulatory Visit
Admission: RE | Admit: 2015-12-07 | Discharge: 2015-12-07 | Disposition: A | Discharge status: Home / Self Care | Payer: Medicare Other | Source: Ambulatory Visit | Attending: Internal Medicine | Admitting: Internal Medicine

## 2015-12-07 DIAGNOSIS — Z1231 Encounter for screening mammogram for malignant neoplasm of breast: Secondary | ICD-10-CM | POA: Diagnosis present

## 2015-12-17 ENCOUNTER — Encounter: Payer: Medicare Other | Attending: Surgery | Admitting: Surgery

## 2015-12-17 DIAGNOSIS — I2510 Atherosclerotic heart disease of native coronary artery without angina pectoris: Secondary | ICD-10-CM | POA: Insufficient documentation

## 2015-12-17 DIAGNOSIS — Z992 Dependence on renal dialysis: Secondary | ICD-10-CM | POA: Insufficient documentation

## 2015-12-17 DIAGNOSIS — N186 End stage renal disease: Secondary | ICD-10-CM | POA: Diagnosis not present

## 2015-12-17 DIAGNOSIS — E11622 Type 2 diabetes mellitus with other skin ulcer: Secondary | ICD-10-CM | POA: Insufficient documentation

## 2015-12-17 DIAGNOSIS — L89112 Pressure ulcer of right upper back, stage 2: Secondary | ICD-10-CM | POA: Diagnosis not present

## 2015-12-17 DIAGNOSIS — X58XXXA Exposure to other specified factors, initial encounter: Secondary | ICD-10-CM | POA: Diagnosis not present

## 2015-12-17 DIAGNOSIS — E785 Hyperlipidemia, unspecified: Secondary | ICD-10-CM | POA: Insufficient documentation

## 2015-12-17 DIAGNOSIS — D649 Anemia, unspecified: Secondary | ICD-10-CM | POA: Diagnosis not present

## 2015-12-17 DIAGNOSIS — I120 Hypertensive chronic kidney disease with stage 5 chronic kidney disease or end stage renal disease: Secondary | ICD-10-CM | POA: Diagnosis not present

## 2015-12-17 DIAGNOSIS — S80821A Blister (nonthermal), right lower leg, initial encounter: Secondary | ICD-10-CM | POA: Diagnosis not present

## 2015-12-17 DIAGNOSIS — E1122 Type 2 diabetes mellitus with diabetic chronic kidney disease: Secondary | ICD-10-CM | POA: Diagnosis not present

## 2015-12-17 NOTE — Progress Notes (Addendum)
ARIYONNA, ARRIOLA ([id number]) Visit Report for 12/17/2015 Chief Complaint Document Details [name], [name]. Date of Service: 12/17/2015 12:45 PM Medical Record Number: [id number] Patient Account Number: [id number] Date of Birth/Sex: [birth year] (60 y.o. Female) Treating RN: Cornell Barman Primary Care Physician: [name] Other Clinician: Referring Physician: [name] Treating Physician/Extender: Frann Rider in Treatment: 58 Information Obtained from: Patient Chief Complaint Patient presents to the wound care center for a consult due non healing wound. She has an open wound on her right upper back which she's had for about a year and she recently noticed a blister on her right lower extremity about 2 weeks ago. Electronic Signature(s) Signed: 12/17/2015 12:57:53 PM By: [name], [name] Entered By: [name] on 12/17/2015 12:57:53 Scrivner, [name] ([id number]) -------------------------------------------------------------------------------- HPI Details [name]. Date of Service: 12/17/2015 12:45 PM Medical Record Number: [id number] Patient Account Number: [id number] Date of Birth/Sex: [birth year] (60 y.o. Female) Treating RN: Cornell Barman Primary Care Physician: [name] Other Clinician: Referring Physician: [name] Treating Physician/Extender: Frann Rider in Treatment: 74 History of Present Illness Location: right upper back and right lower extremity wounds Quality: Patient reports No Pain. Severity: Patient states wound (s) are getting better. Duration: Patient has had the wound for > 3 months prior to seeking treatment at the wound center Timing: she thought it first occurred when she was using a heating pad about a year ago after back surgery. Context: The wound appeared gradually over time Modifying Factors: Patient is currently on renal dialysis and receives treatments 3 times weekly Associated Signs and Symptoms:  Patient reports having: surgery scheduled for this week for a AV fistula left arm. HPI Description: 60 year old patient who is known to be a diabetic and has end-stage renal disease has had several comorbidities including coronary artery disease, hypertension, hyperlipidemia, pancreatitis, anemia, previous history of hysterectomy, cholecystectomy, left-sided salivary gland excision, bilateral cataract surgery,Peritoneal dialysis catheter, hemodialysis catheter. the area on the back has also been caused by instant pressure she used to sleep on a recliner all day and has significant kyphoscoliosis. As far as the wound on her right lower extremity she's not sure how this blister occurred but she thought it has been there for about 2 weeks. No recent blood investigations available and no recent hemoglobin A1c. 12/30/2014 -- she is an assisted living facility but I believe the nurses that have not followed instructions as she had some cream applied on her back and there was a different dressing. Last week she's had a AV fistula placed on her left forearm. 01/13/2015 -- she has had some localized infection at the port site and she's been on doxycycline for this. 02/05/2015 - he has developed a small blister on her right lower extremity. 02/10/2015 -- she has developed another small blister on her right anterior chest wall in the area where she's had tape for her dialysis access. this may just be injury caused by a tape burn. 02/18/2015 -- no new blisters and she had a dermatology opinion and they have taken a biopsy of her skin. She also had a left brachial AV fistula placed this week. 03/03/2015 -- though we do not have the pathology report yet the patient says she has been put on prednisone because this skin disease is possibly to do with her immune system and her dermatologist is recommended this. 03/10/2015 -- she has developed a new blister which is quite large on her right lower extremity on  the shin.  05/22/2015 -- she did very well with her dressing but on trying to remove the [place] it peels away the newly healed skin. LAURANA, BATTEY ([id number]) 06/05/2015 -- the patient was in the ER this past week for severe bleeding from the right nostril and had to have ENT see her and do a packing. They've also been holding her heparin during her dialysis. The patient is going back to ENT today. Other than that she has had no other significant issues. 06/19/2015 -- she is back on her blood thinners and continues to have her hemodialysis as before. 07/31/2015 -- a large blister has popped up again on her right lower extremity in the same place in the mid shin area in the anterior lateral compartment. 10/05/2015 -- last week she was admitted to the hospital for 4 days as she had a pneumonia and was treated with IV and oral antibiotics. Electronic Signature(s) Signed: 12/17/2015 12:57:57 PM By: [name], [name] Entered By: [name] on 12/17/2015 12:57:57 Asbill, [name] ([id number]) -------------------------------------------------------------------------------- Physical Exam Details [name], 81. Date of Service: 12/17/2015 12:45 PM Medical Record Number: [id number] Patient Account Number: [id number] Date of Birth/Sex: [birth year] (60 y.o. Female) Treating RN: Cornell Barman Primary Care Physician: [name] Other Clinician: Referring Physician: [name] Treating Physician/Extender: Frann Rider in Treatment: 51 Constitutional . Pulse regular. Respirations normal and unlabored. Afebrile. . Eyes Nonicteric. Reactive to light. Ears, Nose, Mouth, and Throat Lips, teeth, and gums WNL.Marland Kitchen Moist mucosa without lesions. Neck supple and nontender. No palpable supraclavicular or cervical adenopathy. Normal sized without goiter. Respiratory WNL. No retractions.. Breath sounds WNL, No rubs, rales, rhonchi, or wheeze.. Cardiovascular Heart rhythm and  rate regular, no murmur or gallop.. Pedal Pulses WNL. No clubbing, cyanosis or edema. Chest Breasts symmetical and no nipple discharge.. Breast tissue WNL, no masses, lumps, or tenderness.. Lymphatic No adneopathy. No adenopathy. No adenopathy. Musculoskeletal Adexa without tenderness or enlargement.. Digits and nails w/o clubbing, cyanosis, infection, petechiae, ischemia, or inflammatory conditions.. Integumentary (Hair, Skin) No suspicious lesions. No crepitus or fluctuance. No peri-wound warmth or erythema. No masses.Marland Kitchen Psychiatric Judgement and insight Intact.. No evidence of depression, anxiety, or agitation.. Notes she has almost completely resolved except for a few tiny areas which are open and we will change over to RTD. Electronic Signature(s) Signed: 12/17/2015 12:58:42 PM By: [name], [name] Entered By: [name] on 12/17/2015 12:58:41 Zentner, [name] ([id number]) -------------------------------------------------------------------------------- Physician Orders Details Patient Name: [name] Date of Service: 12/17/2015 12:45 PM Medical Record Number: [id number] Patient Account Number: [id number] Date of Birth/Sex: 1955-08-20 (60 y.o. Female) Treating RN: Cornell Barman Primary Care Physician: [name] Other Clinician: Referring Physician: [name] Treating Physician/Extender: Frann Rider in Treatment: 29 Verbal / Phone Orders: Yes Clinician: Cornell Barman Read Back and Verified: Yes Diagnosis Coding Wound Cleansing Wound #1 Right Back o Clean wound with Normal Saline. Skin Barriers/Peri-Wound Care Wound #1 Right Back o Skin Prep Primary Wound Dressing Wound #1 Right Back o Other: - RTD Secondary Dressing Wound #1 Right Back o Boardered Foam Dressing Dressing Change Frequency Wound #1 Right Back o Other: - Monday Follow-up Appointments Wound #1 Right Back o Return Appointment in 1 week. Electronic Signature(s) Signed:  12/17/2015 4:11:18 PM By: [name], [name] Signed: 12/17/2015 5:33:54 PM By: Gretta Cool RN, [name], Kim RN, [name] Entered By: Gretta Cool, RN, [name], Kim on 12/17/2015 12:57:53 Dorsi, [name] ([id number]) -------------------------------------------------------------------------------- Problem List Details Patient Name: WRIGLEY, REISIG. Date of Service:  12/17/2015 12:45 PM Medical Record Number: [id number] Patient Account Number: [id number] Date of Birth/Sex: January 07, 1956 (60 y.o. Female) Treating RN: Cornell Barman Primary Care Physician: [name] Other Clinician: Referring Physician: [name] Treating Physician/Extender: Frann Rider in Treatment: 44 Active Problems ICD-10 Encounter Code Description Active Date Diagnosis E11.622 Type 2 diabetes mellitus with other skin ulcer 12/23/2014 Yes L89.112 Pressure ulcer of right upper back, stage 2 12/23/2014 Yes S80.821A Blister (nonthermal), right lower leg, initial encounter 12/23/2014 Yes N18.6 End stage renal disease 12/23/2014 Yes Inactive Problems Resolved Problems Electronic Signature(s) Signed: 12/17/2015 12:57:46 PM By: [name], [name] Entered By: [name] on 12/17/2015 12:57:46 Otwell, [name] ([id number]) -------------------------------------------------------------------------------- Progress Note Details Patient Name: Deery, Jamilynn S. Date of Service: 12/17/2015 12:45 PM Medical Record Number: [id number] Patient Account Number: [id number] Date of Birth/Sex: 17-Nov-1955 (60 y.o. Female) Treating RN: Cornell Barman Primary Care Physician: [name] Other Clinician: Referring Physician: [name] Treating Physician/Extender: Frann Rider in Treatment: 58 Subjective Chief Complaint Information obtained from Patient Patient presents to the wound care center for a consult due non healing wound. She has an open wound on her right upper back which she's had for about a year and she recently noticed a blister on her right  lower extremity about 2 weeks ago. History of Present Illness (HPI) The following HPI elements were documented for the patient's wound: Location: right upper back and right lower extremity wounds Quality: Patient reports No Pain. Severity: Patient states wound (s) are getting better. Duration: Patient has had the wound for > 3 months prior to seeking treatment at the wound center Timing: she thought it first occurred when she was using a heating pad about a year ago after back surgery. Context: The wound appeared gradually over time Modifying Factors: Patient is currently on renal dialysis and receives treatments 3 times weekly Associated Signs and Symptoms: Patient reports having: surgery scheduled for this week for a AV fistula left arm. 60 year old patient who is known to be a diabetic and has end-stage renal disease has had several comorbidities including coronary artery disease, hypertension, hyperlipidemia, pancreatitis, anemia, previous history of hysterectomy, cholecystectomy, left-sided salivary gland excision, bilateral cataract surgery,Peritoneal dialysis catheter, hemodialysis catheter. the area on the back has also been caused by instant pressure she used to sleep on a recliner all day and has significant kyphoscoliosis. As far as the wound on her right lower extremity she's not sure how this blister occurred but she thought it has been there for about 2 weeks. No recent blood investigations available and no recent hemoglobin A1c. 12/30/2014 -- she is an assisted living facility but I believe the nurses that have not followed instructions as she had some cream applied on her back and there was a different dressing. Last week she's had a AV fistula placed on her left forearm. 01/13/2015 -- she has had some localized infection at the port site and she's been on doxycycline for this. 02/05/2015 - he has developed a small blister on her right lower extremity. 02/10/2015 -- she has  developed another small blister on her right anterior chest wall in the area where she's had tape for her dialysis access. this may just be injury caused by a tape burn. 02/18/2015 -- no new blisters and she had a dermatology opinion and they have taken a biopsy of her skin. Mcmichael, [name] ([id number]) She also had a left brachial AV fistula placed this week. 03/03/2015 -- though we do not have the pathology report yet  the patient says she has been put on prednisone because this skin disease is possibly to do with her immune system and her dermatologist is recommended this. 03/10/2015 -- she has developed a new blister which is quite large on her right lower extremity on the shin. 05/22/2015 -- she did very well with her dressing but on trying to remove the [place] it peels away the newly healed skin. 06/05/2015 -- the patient was in the ER this past week for severe bleeding from the right nostril and had to have ENT see her and do a packing. They've also been holding her heparin during her dialysis. The patient is going back to ENT today. Other than that she has had no other significant issues. 06/19/2015 -- she is back on her blood thinners and continues to have her hemodialysis as before. 07/31/2015 -- a large blister has popped up again on her right lower extremity in the same place in the mid shin area in the anterior lateral compartment. 10/05/2015 -- last week she was admitted to the hospital for 4 days as she had a pneumonia and was treated with IV and oral antibiotics. Objective Constitutional Pulse regular. Respirations normal and unlabored. Afebrile. Vitals Time Taken: 12:43 PM, Height: 65 in, Weight: 156 lbs, BMI: 26, Temperature: 98.4 F, Pulse: 86 bpm, Respiratory Rate: 18 breaths/min, Blood Pressure: 169/68 mmHg. Eyes Nonicteric. Reactive to light. Ears, Nose, Mouth, and Throat Lips, teeth, and gums WNL.Marland Kitchen Moist mucosa without lesions. Neck supple and nontender. No  palpable supraclavicular or cervical adenopathy. Normal sized without goiter. Respiratory WNL. No retractions.. Breath sounds WNL, No rubs, rales, rhonchi, or wheeze.. Cardiovascular Heart rhythm and rate regular, no murmur or gallop.. Pedal Pulses WNL. No clubbing, cyanosis or edema. Chest Breasts symmetical and no nipple discharge.. Breast tissue WNL, no masses, lumps, or tenderness.[name] ([id number]) Lymphatic No adneopathy. No adenopathy. No adenopathy. Musculoskeletal Adexa without tenderness or enlargement.. Digits and nails w/o clubbing, cyanosis, infection, petechiae, ischemia, or inflammatory conditions.Marland Kitchen Psychiatric Judgement and insight Intact.. No evidence of depression, anxiety, or agitation.. General Notes: she has almost completely resolved except for a few tiny areas which are open and we will change over to RTD. Integumentary (Hair, Skin) No suspicious lesions. No crepitus or fluctuance. No peri-wound warmth or erythema. No masses.. Wound #1 status is Open. Original cause of wound was Blister. The wound is located on the Right Back. The wound measures 0.3cm length x 0.5cm width x 0.1cm depth; 0.118cm^2 area and 0.012cm^3 volume. The wound is limited to skin breakdown. There is a medium amount of serosanguineous drainage noted. The wound margin is distinct with the outline attached to the wound base. There is large (67-100%) pink, pale granulation within the wound bed. There is no necrotic tissue within the wound bed. The periwound skin appearance exhibited: Moist. The periwound skin appearance did not exhibit: Callus, Crepitus, Excoriation, Fluctuance, Friable, Induration, Localized Edema, Rash, Scarring, Dry/Scaly, Maceration, Atrophie Blanche, Cyanosis, Ecchymosis, Hemosiderin Staining, Mottled, Pallor, Rubor, Erythema. Periwound temperature was noted as No Abnormality. Assessment Active Problems ICD-10 E11.622 - Type 2 diabetes mellitus with other skin  ulcer L89.112 - Pressure ulcer of right upper back, stage 2 S80.821A - Blister (nonthermal), right lower leg, initial encounter N18.6 - End stage renal disease Plan Wound Cleansing: Wound #1 Right Back: Thomley, Evey S. ([id number]) Clean wound with Normal Saline. Skin Barriers/Peri-Wound Care: Wound #1 Right Back: Skin Prep Primary Wound Dressing: Wound #1 Right Back: Other: - RTD Secondary Dressing: Wound #1  Right Back: Boardered Foam Dressing Dressing Change Frequency: Wound #1 Right Back: Other: - Monday Follow-up Appointments: Wound #1 Right Back: Return Appointment in 1 week. She has almost completely resolved except for a few tiny areas which are open and we will change over to RTD. we will cover this with a bordered foam and see her back next week, hopefully ready for discharge Electronic Signature(s) Signed: 12/17/2015 12:59:08 PM By: [name], [name] Entered By: [name] on 12/17/2015 12:59:07 Zepeda, [name] ([id number]) -------------------------------------------------------------------------------- SuperBill Details Patient Name: [name] Date of Service: 12/17/2015 Medical Record Number: [id number] Patient Account Number: [id number] Date of Birth/Sex: [birth year] (60 y.o. Female) Treating RN: Cornell Barman Primary Care Physician: [name] Other Clinician: Referring Physician: [name] Treating Physician/Extender: Frann Rider in Treatment: 51 Diagnosis Coding ICD-10 Codes Code Description E11.622 Type 2 diabetes mellitus with other skin ulcer L89.112 Pressure ulcer of right upper back, stage 2 S80.821A Blister (nonthermal), right lower leg, initial encounter N18.6 End stage renal disease Facility Procedures CPT4 Code: [id number] Description: [phone] - WOUND CARE VISIT-LEV 2 EST PT Modifier: Quantity: 1 Physician Procedures CPT4 Code: [id number] Description: [id number] - WC PHYS LEVEL 3 - EST PT ICD-10 Description Diagnosis E11.622 Type 2  diabetes mellitus with other skin ulcer L89.112 Pressure ulcer of right upper back, stage 2 S80.821A Blister (nonthermal), right lower leg, initial enc N18.6 End  stage renal disease Modifier: ounter Quantity: 1 Electronic Signature(s) Signed: 12/17/2015 4:11:18 PM By: [name], [name] Signed: 12/17/2015 5:33:54 PM By: Gretta Cool RN, [name], Kim RN, [name] Previous Signature: 12/17/2015 12:59:24 PM Version By: [name], [name] Entered By: Gretta Cool RN, [name], Kim on 12/17/2015 14:53:25

## 2015-12-18 NOTE — Progress Notes (Signed)
KEYSA, TROOP ([id number]) Visit Report for 12/17/2015 Arrival Information Details [name], [name]. Date of Service: 12/17/2015 12:45 PM Medical Record Number: [id number] Patient Account Number: [id number] Date of Birth/Sex: Jul 14, 1956 (60 y.o. Female) Treating RN: Cornell Barman Primary Care Physician: [name] Other Clinician: Referring Physician: [name] Treating Physician/Extender: Frann Rider in Treatment: 85 Visit Information History Since Last Visit Added or deleted any medications: No Patient Arrived: Wheel Chair Any new allergies or adverse reactions: No Arrival Time: 12:42 Had a fall or experienced change in No Accompanied By: self activities of daily living that may affect Transfer Assistance: None risk of falls: Patient Identification Verified: Yes Signs or symptoms of abuse/neglect since last No Secondary Verification Process Yes visito Completed: Hospitalized since last visit: No Patient Requires Transmission- No Has Dressing in Place as Prescribed: Yes Based Precautions: Pain Present Now: No Patient Has Alerts: Yes Patient Alerts: Patient on Blood Thinner Electronic Signature(s) Signed: 12/17/2015 5:33:54 PM By: Gretta Cool, RN, [name], Kim RN, [name] Entered By: Gretta Cool, RN, [name], Kim on 12/17/2015 12:43:31 [name], [name] ([id number]) -------------------------------------------------------------------------------- Clinic Level of Care Assessment Details [name]. Date of Service: 12/17/2015 12:45 PM Medical Record Number: [id number] Patient Account Number: [id number] Date of Birth/Sex: 1955/12/11 (60 y.o. Female) Treating RN: Cornell Barman Primary Care Physician: [name] Other Clinician: Referring Physician: [name] Treating Physician/Extender: Frann Rider in Treatment: 59 Clinic Level of Care Assessment Items TOOL 4 Quantity Score []  - Use when only an EandM is performed on FOLLOW-UP visit 0 ASSESSMENTS -  Nursing Assessment / Reassessment []  - Reassessment of Co-morbidities (includes updates in patient status) 0 X - Reassessment of Adherence to Treatment Plan 1 5 ASSESSMENTS - Wound and Skin Assessment / Reassessment X - Simple Wound Assessment / Reassessment - one wound 1 5 []  - Complex Wound Assessment / Reassessment - multiple wounds 0 []  - Dermatologic / Skin Assessment (not related to wound area) 0 ASSESSMENTS - Focused Assessment []  - Circumferential Edema Measurements - multi extremities 0 []  - Nutritional Assessment / Counseling / Intervention 0 []  - Lower Extremity Assessment (monofilament, tuning fork, pulses) 0 []  - Peripheral Arterial Disease Assessment (using hand held doppler) 0 ASSESSMENTS - Ostomy and/or Continence Assessment and Care []  - Incontinence Assessment and Management 0 []  - Ostomy Care Assessment and Management (repouching, etc.) 0 PROCESS - Coordination of Care X - Simple Patient / Family Education for ongoing care 1 15 []  - Complex (extensive) Patient / Family Education for ongoing care 0 X - Staff obtains Programmer, systems, Records, Test Results / Process Orders 1 10 []  - Staff telephones HHA, Nursing Homes / Clarify orders / etc 0 []  - Routine Transfer to another Facility (non-emergent condition) 0 [name], [name] ([id number]) []  - Routine Hospital Admission (non-emergent condition) 0 []  - New Admissions / Biomedical engineer / Ordering NPWT, Apligraf, etc. 0 []  - Emergency Hospital Admission (emergent condition) 0 X - Simple Discharge Coordination 1 10 []  - Complex (extensive) Discharge Coordination 0 PROCESS - Special Needs []  - Pediatric / Minor Patient Management 0 []  - Isolation Patient Management 0 []  - Hearing / Language / Visual special needs 0 []  - Assessment of Community assistance (transportation, D/C planning, etc.) 0 []  - Additional assistance / Altered mentation 0 []  - Support Surface(s) Assessment (bed, cushion, seat, etc.) 0 INTERVENTIONS -  Wound Cleansing / Measurement X - Simple Wound Cleansing - one wound 1 5 []  - Complex Wound Cleansing - multiple wounds 0 X -  Wound Imaging (photographs - any number of wounds) 1 5 []  - Wound Tracing (instead of photographs) 0 []  - Simple Wound Measurement - one wound 0 []  - Complex Wound Measurement - multiple wounds 0 INTERVENTIONS - Wound Dressings X - Small Wound Dressing one or multiple wounds 1 10 []  - Medium Wound Dressing one or multiple wounds 0 []  - Large Wound Dressing one or multiple wounds 0 []  - Application of Medications - topical 0 []  - Application of Medications - injection 0 INTERVENTIONS - Miscellaneous []  - External ear exam 0 [name], [name] ([id number]) []  - Specimen Collection (cultures, biopsies, blood, body fluids, etc.) 0 []  - Specimen(s) / Culture(s) sent or taken to Lab for analysis 0 []  - Patient Transfer (multiple staff / Harrel Lemon Lift / Similar devices) 0 []  - Simple Staple / Suture removal (25 or less) 0 []  - Complex Staple / Suture removal (26 or more) 0 []  - Hypo / Hyperglycemic Management (close monitor of Blood Glucose) 0 []  - Ankle / Brachial Index (ABI) - do not check if billed separately 0 X - Vital Signs 1 5 Has the patient been seen at the hospital within the last three years: Yes Total Score: 70 Level Of Care: New/Established - Level 2 Electronic Signature(s) Signed: 12/17/2015 5:33:54 PM By: Gretta Cool, RN, [name], Kim RN, [name] Entered By: Gretta Cool, RN, [name], Kim on 12/17/2015 13:01:22 [name], [name] ([id number]) -------------------------------------------------------------------------------- Encounter Discharge Information Details [name], [name]. Date of Service: 12/17/2015 12:45 PM Medical Record Number: [id number] Patient Account Number: [id number] Date of Birth/Sex: August 15, 1955 (60 y.o. Female) Treating RN: Cornell Barman Primary Care Physician: [name] Other Clinician: Referring Physician: [name] Treating Physician/Extender:  Frann Rider in Treatment: 59 Encounter Discharge Information Items Schedule Follow-up Appointment: No Medication Reconciliation completed No and provided to Patient/Care Demetris Capell: Provided on Clinical Summary of Care: 12/17/2015 Form Type Recipient Paper Patient LD Electronic Signature(s) Signed: 12/17/2015 1:01:58 PM By: Ruthine Dose Entered By: Ruthine Dose on 12/17/2015 13:01:58 Pe, [name] ([id number]) -------------------------------------------------------------------------------- Multi Wound Chart Details [name], [name] Date of Service: 12/17/2015 12:45 PM Medical Record Number: [id number] Patient Account Number: [id number] Date of Birth/Sex: 10/30/1955 (60 y.o. Female) Treating RN: Cornell Barman Primary Care Physician: [name] Other Clinician: Referring Physician: [name] Treating Physician/Extender: Frann Rider in Treatment: 51 Vital Signs Height(in): 65 Pulse(bpm): 86 Weight(lbs): 156 Blood Pressure 169/68 (mmHg): Body Mass Index(BMI): 26 Temperature(F): 98.4 Respiratory Rate 18 (breaths/min): Photos: [1:No Photos] [N/A:N/A] Wound Location: [1:Right Back] [N/A:N/A] Wounding Event: [1:Blister] [N/A:N/A] Primary Etiology: [1:Pressure Ulcer] [N/A:N/A] Comorbid History: [1:Glaucoma, Anemia, Coronary Artery Disease, Hypertension, Type II Diabetes, End Stage Renal Disease, Osteoarthritis, Neuropathy] [N/A:N/A] Date Acquired: [1:10/19/2013] [N/A:N/A] Weeks of Treatment: [1:51] [N/A:N/A] Wound Status: [1:Open] [N/A:N/A] Measurements L x W x D 0.3x0.5x0.1 [N/A:N/A] (cm) Area (cm) : [1:0.118] [N/A:N/A] Volume (cm) : [1:0.012] [N/A:N/A] % Reduction in Area: [1:99.70%] [N/A:N/A] % Reduction in Volume: 99.70% [N/A:N/A] Classification: [1:Category/Stage II] [N/A:N/A] Exudate Amount: [1:Medium] [N/A:N/A] Exudate Type: [1:Serosanguineous] [N/A:N/A] Exudate Color: [1:red, brown] [N/A:N/A] Wound Margin: [1:Distinct, outline  attached N/A] Granulation Amount: [1:Large (67-100%)] [N/A:N/A] Granulation Quality: [1:Pink, Pale] [N/A:N/A] Necrotic Amount: [1:None Present (0%)] [N/A:N/A] Exposed Structures: [1:Fascia: No Fat: No Tendon: No] [N/A:N/A] Muscle: No Joint: No Bone: No Limited to Skin Breakdown Epithelialization: None N/A N/A Periwound Skin Texture: Edema: No N/A N/A Excoriation: No Induration: No Callus: No Crepitus: No Fluctuance: No Friable: No Rash: No Scarring: No Periwound Skin Moist: Yes N/A N/A Moisture: Maceration: No Dry/Scaly: No  Periwound Skin Color: Atrophie Blanche: No N/A N/A Cyanosis: No Ecchymosis: No Erythema: No Hemosiderin Staining: No Mottled: No Pallor: No Rubor: No Temperature: No Abnormality N/A N/A Tenderness on No N/A N/A Palpation: Wound Preparation: Ulcer Cleansing: N/A N/A Rinsed/Irrigated with Saline Topical Anesthetic Applied: None Treatment Notes Electronic Signature(s) Signed: 12/17/2015 5:33:54 PM By: Gretta Cool, RN, [name], Kim RN, [name] Entered By: Gretta Cool, RN, [name], Kim on 12/17/2015 12:56:03 Mclucas, [name] ([id number]) -------------------------------------------------------------------------------- [place] Details [name], [name]. Date of Service: 12/17/2015 12:45 PM Medical Record Number: [id number] Patient Account Number: [id number] Date of Birth/Sex: [birth year] (60 y.o. Female) Treating RN: Cornell Barman Primary Care Physician: [name] Other Clinician: Referring Physician: [name] Treating Physician/Extender: Frann Rider in Treatment: 45 Active Inactive Abuse / Safety / Falls / Self Care Management Nursing Diagnoses: Potential for falls Goals: Patient will remain injury free Date Initiated: 12/23/2014 Goal Status: Active Patient/caregiver will verbalize understanding of skin care regimen Date Initiated: 12/23/2014 Goal Status: Active Patient/caregiver will verbalize/demonstrate measures taken to  prevent injury and/or falls Date Initiated: 12/23/2014 Goal Status: Active Patient/caregiver will verbalize/demonstrate understanding of what to do in case of emergency Date Initiated: 12/23/2014 Goal Status: Active Interventions: Assess fall risk on admission and as needed Assess: immobility, friction, shearing, incontinence upon admission and as needed Assess impairment of mobility on admission and as needed per policy Assess self care needs on admission and as needed Provide education on fall prevention Notes: Wound/Skin Impairment Nursing Diagnoses: Impaired tissue integrity Knowledge deficit related to ulceration/compromised skin integrity Goals: Patient/caregiver will verbalize understanding of skin care regimen [name], [name] ([id number]) Date Initiated: 12/23/2014 Goal Status: Active Ulcer/skin breakdown will heal within 14 weeks Date Initiated: 12/23/2014 Goal Status: Active Interventions: Assess patient/caregiver ability to obtain necessary supplies Assess patient/caregiver ability to perform ulcer/skin care regimen upon admission and as needed Assess ulceration(s) every visit Provide education on ulcer and skin care Treatment Activities: Skin care regimen initiated : 11/05/2015 Topical wound management initiated : 11/05/2015 Notes: Electronic Signature(s) Signed: 12/17/2015 5:33:54 PM By: Gretta Cool, RN, [name], Kim RN, [name] Entered By: Gretta Cool, RN, [name], Kim on 12/17/2015 12:53:35 [name], [name] ([id number]) -------------------------------------------------------------------------------- Pain Assessment Details [name]. Date of Service: 12/17/2015 12:45 PM Medical Record Number: [id number] Patient Account Number: [id number] Date of Birth/Sex: Jun 13, 1956 (60 y.o. Female) Treating RN: Cornell Barman Primary Care Physician: [name] Other Clinician: Referring Physician: [name] Treating Physician/Extender: Frann Rider in Treatment: 28 Active  Problems Location of Pain Severity and Description of Pain Patient Has Paino No Site Locations With Dressing Change: No Pain Management and Medication Current Pain Management: Electronic Signature(s) Signed: 12/17/2015 5:33:54 PM By: Gretta Cool, RN, [name], Kim RN, [name] Entered By: Gretta Cool, RN, [name], Kim on 12/17/2015 12:43:46 [name], [name] ([id number]) -------------------------------------------------------------------------------- Wound Assessment Details [name], MURDEN. Date of Service: 12/17/2015 12:45 PM Medical Record Number: [id number] Patient Account Number: [id number] Date of Birth/Sex: 1956/05/15 (60 y.o. Female) Treating RN: Cornell Barman Primary Care Physician: [name] Other Clinician: Referring Physician: [name] Treating Physician/Extender: Frann Rider in Treatment: 31 Wound Status Wound Number: 1 Primary Pressure Ulcer Etiology: Wound Location: Right Back Wound Open Wounding Event: Blister Status: Date Acquired: 10/19/2013 Comorbid Glaucoma, Anemia, Coronary Artery Weeks Of Treatment: 58 History: Disease, Hypertension, Type II Clustered Wound: No Diabetes, End Stage Renal Disease, Osteoarthritis, Neuropathy Photos Photo Uploaded By: Gretta Cool, RN, [name], Kim on 12/17/2015 13:11:45 Wound Measurements Length: (cm) 0.3 Width: (cm) 0.5 Depth: (cm) 0.1 Area: (cm)  0.118 Volume: (cm) 0.012 % Reduction in Area: 99.7% % Reduction in Volume: 99.7% Epithelialization: None Wound Description Classification: Category/Stage II Wound Margin: Distinct, outline attached Exudate Amount: Medium Exudate Type: Serosanguineous Exudate Color: red, brown Foul Odor After Cleansing: No Wound Bed Granulation Amount: Large (67-100%) Exposed Structure Granulation Quality: Pink, Pale Fascia Exposed: No Necrotic Amount: None Present (0%) Fat Layer Exposed: No Strother, Donnia S. ([id number]) Tendon Exposed: No Muscle Exposed: No Joint Exposed: No Bone Exposed:  No Limited to Skin Breakdown Periwound Skin Texture Texture Color No Abnormalities Noted: No No Abnormalities Noted: No Callus: No Atrophie Blanche: No Crepitus: No Cyanosis: No Excoriation: No Ecchymosis: No Fluctuance: No Erythema: No Friable: No Hemosiderin Staining: No Induration: No Mottled: No Localized Edema: No Pallor: No Rash: No Rubor: No Scarring: No Temperature / Pain Moisture Temperature: No Abnormality No Abnormalities Noted: No Dry / Scaly: No Maceration: No Moist: Yes Wound Preparation Ulcer Cleansing: Rinsed/Irrigated with Saline Topical Anesthetic Applied: None Electronic Signature(s) Signed: 12/17/2015 5:33:54 PM By: Gretta Cool, RN, [name], Kim RN, [name] Entered By: Gretta Cool, RN, [name], Kim on 12/17/2015 12:51:11 Mcdougle, [name] ([id number]) -------------------------------------------------------------------------------- [facility] Details [name]. Date of Service: 12/17/2015 12:45 PM Medical Record Number: [id number] Patient Account Number: [id number] Date of Birth/Sex: 20-Jul-1955 (60 y.o. Female) Treating RN: Cornell Barman Primary Care Physician: [name] Other Clinician: Referring Physician: [name] Treating Physician/Extender: Frann Rider in Treatment: 51 Vital Signs Time Taken: 12:43 Temperature (F): 98.4 Height (in): 65 Pulse (bpm): 86 Weight (lbs): 156 Respiratory Rate (breaths/min): 18 Body Mass Index (BMI): 26 Blood Pressure (mmHg): 169/68 Reference Range: 80 - 120 mg / dl Electronic Signature(s) Signed: 12/17/2015 5:33:54 PM By: Gretta Cool, RN, [name], Kim RN, [name] Entered By: Gretta Cool, RN, [name], Kim on 12/17/2015 12:44:08

## 2015-12-25 ENCOUNTER — Encounter: Payer: Medicare Other | Admitting: Surgery

## 2015-12-25 DIAGNOSIS — E11622 Type 2 diabetes mellitus with other skin ulcer: Secondary | ICD-10-CM | POA: Diagnosis not present

## 2015-12-26 NOTE — Progress Notes (Signed)
RILEIGH, CIRELLO ([id number]) Visit Report for 12/25/2015 Arrival Information Details [name], [name]. Date of Service: 12/25/2015 10:00 AM Medical Record Number: [id number] Patient Account Number: [id number] Date of Birth/Sex: 07/26/1955 (60 y.o. Female) Treating RN: Cornell Barman Primary Care Physician: [name] Other Clinician: Referring Physician: [name] Treating Physician/Extender: Frann Rider in Treatment: 21 Visit Information History Since Last Visit Added or deleted any medications: No Patient Arrived: Ambulatory Any new allergies or adverse reactions: No Arrival Time: 09:56 Had a fall or experienced change in No Accompanied By: self activities of daily living that may affect Transfer Assistance: None risk of falls: Patient Identification Verified: Yes Signs or symptoms of abuse/neglect since last No Secondary Verification Process Yes visito Completed: Hospitalized since last visit: No Patient Requires Transmission- No Has Dressing in Place as Prescribed: Yes Based Precautions: Pain Present Now: No Patient Has Alerts: Yes Patient Alerts: Patient on Blood Thinner Electronic Signature(s) Signed: 12/25/2015 4:33:28 PM By: Gretta Cool, RN, [name], Kim RN, [name] Entered By: Gretta Cool, RN, [name], Kim on 12/25/2015 09:56:42 Yoss, [name] ([id number]) -------------------------------------------------------------------------------- Clinic Level of Care Assessment Details [name]. Date of Service: 12/25/2015 10:00 AM Medical Record Number: [id number] Patient Account Number: [id number] Date of Birth/Sex: 09-09-1955 (60 y.o. Female) Treating RN: Cornell Barman Primary Care Physician: [name] Other Clinician: Referring Physician: [name] Treating Physician/Extender: Frann Rider in Treatment: 61 Clinic Level of Care Assessment Items TOOL 4 Quantity Score []  - Use when only an EandM is performed on FOLLOW-UP visit 0 ASSESSMENTS -  Nursing Assessment / Reassessment []  - Reassessment of Co-morbidities (includes updates in patient status) 0 X - Reassessment of Adherence to Treatment Plan 1 5 ASSESSMENTS - Wound and Skin Assessment / Reassessment X - Simple Wound Assessment / Reassessment - one wound 1 5 []  - Complex Wound Assessment / Reassessment - multiple wounds 0 []  - Dermatologic / Skin Assessment (not related to wound area) 0 ASSESSMENTS - Focused Assessment []  - Circumferential Edema Measurements - multi extremities 0 []  - Nutritional Assessment / Counseling / Intervention 0 []  - Lower Extremity Assessment (monofilament, tuning fork, pulses) 0 []  - Peripheral Arterial Disease Assessment (using hand held doppler) 0 ASSESSMENTS - Ostomy and/or Continence Assessment and Care []  - Incontinence Assessment and Management 0 []  - Ostomy Care Assessment and Management (repouching, etc.) 0 PROCESS - Coordination of Care X - Simple Patient / Family Education for ongoing care 1 15 []  - Complex (extensive) Patient / Family Education for ongoing care 0 X - Staff obtains Programmer, systems, Records, Test Results / Process Orders 1 10 []  - Staff telephones HHA, Nursing Homes / Clarify orders / etc 0 []  - Routine Transfer to another Facility (non-emergent condition) 0 Tregre, Dameisha S. ([id number]) []  - Routine Hospital Admission (non-emergent condition) 0 []  - New Admissions / Biomedical engineer / Ordering NPWT, Apligraf, etc. 0 []  - Emergency Hospital Admission (emergent condition) 0 X - Simple Discharge Coordination 1 10 []  - Complex (extensive) Discharge Coordination 0 PROCESS - Special Needs []  - Pediatric / Minor Patient Management 0 []  - Isolation Patient Management 0 []  - Hearing / Language / Visual special needs 0 []  - Assessment of Community assistance (transportation, D/C planning, etc.) 0 []  - Additional assistance / Altered mentation 0 []  - Support Surface(s) Assessment (bed, cushion, seat, etc.) 0 INTERVENTIONS -  Wound Cleansing / Measurement X - Simple Wound Cleansing - one wound 1 5 []  - Complex Wound Cleansing - multiple wounds 0 X -  Wound Imaging (photographs - any number of wounds) 1 5 []  - Wound Tracing (instead of photographs) 0 X - Simple Wound Measurement - one wound 1 5 []  - Complex Wound Measurement - multiple wounds 0 INTERVENTIONS - Wound Dressings X - Small Wound Dressing one or multiple wounds 1 10 []  - Medium Wound Dressing one or multiple wounds 0 []  - Large Wound Dressing one or multiple wounds 0 []  - Application of Medications - topical 0 []  - Application of Medications - injection 0 INTERVENTIONS - Miscellaneous []  - External ear exam 0 [place], Loveah S. ([id number]) []  - Specimen Collection (cultures, biopsies, blood, body fluids, etc.) 0 []  - Specimen(s) / Culture(s) sent or taken to Lab for analysis 0 []  - Patient Transfer (multiple staff / Harrel Lemon Lift / Similar devices) 0 []  - Simple Staple / Suture removal (25 or less) 0 []  - Complex Staple / Suture removal (26 or more) 0 []  - Hypo / Hyperglycemic Management (close monitor of Blood Glucose) 0 []  - Ankle / Brachial Index (ABI) - do not check if billed separately 0 X - Vital Signs 1 5 Has the patient been seen at the hospital within the last three years: Yes Total Score: 75 Level Of Care: New/Established - Level 2 Electronic Signature(s) Signed: 12/25/2015 4:33:28 PM By: Gretta Cool, RN, [name], Kim RN, [name] Entered By: Gretta Cool, RN, [name], Kim on 12/25/2015 10:27:23 Zeoli, [name] ([id number]) -------------------------------------------------------------------------------- Encounter Discharge Information Details [name]. Date of Service: 12/25/2015 10:00 AM Medical Record Number: [id number] Patient Account Number: [id number] Date of Birth/Sex: 07/29/1955 (60 y.o. Female) Treating RN: Cornell Barman Primary Care Physician: [name] Other Clinician: Referring Physician: [name] Treating Physician/Extender:  Frann Rider in Treatment: 52 Encounter Discharge Information Items Schedule Follow-up Appointment: No Medication Reconciliation completed No and provided to Patient/Care Amaiah Cristiano: Provided on Clinical Summary of Care: 12/25/2015 Form Type Recipient Paper Patient LD Electronic Signature(s) Signed: 12/25/2015 10:29:19 AM By: Ruthine Dose Entered By: Ruthine Dose on 12/25/2015 10:29:19 Caradonna, [name] ([id number]) -------------------------------------------------------------------------------- Multi Wound Chart Details Patient Name: [name], Tyarra S. Date of Service: 12/25/2015 10:00 AM Medical Record Number: [id number] Patient Account Number: [id number] Date of Birth/Sex: [birth year] (60 y.o. Female) Treating RN: Cornell Barman Primary Care Physician: [name] Other Clinician: Referring Physician: [name] Treating Physician/Extender: Frann Rider in Treatment: 52 Vital Signs Height(in): 65 Pulse(bpm): 88 Weight(lbs): 156 Blood Pressure 168/70 (mmHg): Body Mass Index(BMI): 26 Temperature(F): 98.6 Respiratory Rate 18 (breaths/min): Photos: [N/A:N/A] Wound Location: Right Back N/A N/A Wounding Event: Blister N/A N/A Primary Etiology: Pressure Ulcer N/A N/A Comorbid History: Glaucoma, Anemia, N/A N/A Coronary Artery Disease, Hypertension, Type II Diabetes, End Stage Renal Disease, Osteoarthritis, Neuropathy Date Acquired: 10/19/2013 N/A N/A Weeks of Treatment: 52 N/A N/A Wound Status: Open N/A N/A Measurements L x W x D 3.5x5x0.1 N/A N/A (cm) Area (cm) : 13.744 N/A N/A Volume (cm) : 1.374 N/A N/A % Reduction in Area: 64.30% N/A N/A % Reduction in Volume: 64.30% N/A N/A Classification: Category/Stage II N/A N/A Exudate Amount: Medium N/A N/A Exudate Type: Serosanguineous N/A N/A Exudate Color: red, brown N/A N/A Wound Margin: Distinct, outline attached N/A N/A Halberg, Hadlie S. ([id number]) Granulation Amount: Large (67-100%) N/A N/A Granulation  Quality: Red, Friable N/A N/A Necrotic Amount: Small (1-33%) N/A N/A Exposed Structures: Fascia: No N/A N/A Fat: No Tendon: No Muscle: No Joint: No Bone: No Limited to Skin Breakdown Epithelialization: None N/A N/A Periwound Skin Texture: Friable: Yes N/A N/A Scarring: Yes Edema: No  Excoriation: No Induration: No Callus: No Crepitus: No Fluctuance: No Rash: No Periwound Skin Moist: Yes N/A N/A Moisture: Maceration: No Dry/Scaly: No Periwound Skin Color: Atrophie Blanche: No N/A N/A Cyanosis: No Ecchymosis: No Erythema: No Hemosiderin Staining: No Mottled: No Pallor: No Rubor: No Temperature: No Abnormality N/A N/A Tenderness on No N/A N/A Palpation: Wound Preparation: Ulcer Cleansing: N/A N/A Rinsed/Irrigated with Saline Topical Anesthetic Applied: None Treatment Notes Electronic Signature(s) Signed: 12/25/2015 4:33:28 PM By: Gretta Cool, RN, [name], Kim RN, [name] Entered By: Gretta Cool, RN, [name], Kim on 12/25/2015 10:03:14 Schear, [name] ([id number]) -------------------------------------------------------------------------------- [place] Details Patient Name: SHALENE, TAPE. Date of Service: 12/25/2015 10:00 AM Medical Record Number: [id number] Patient Account Number: [id number] Date of Birth/Sex: 03/21/1956 (60 y.o. Female) Treating RN: Cornell Barman Primary Care Physician: [name] Other Clinician: Referring Physician: [name] Treating Physician/Extender: Frann Rider in Treatment: 62 Active Inactive Abuse / Safety / Falls / Self Care Management Nursing Diagnoses: Potential for falls Goals: Patient will remain injury free Date Initiated: 12/23/2014 Goal Status: Active Patient/caregiver will verbalize understanding of skin care regimen Date Initiated: 12/23/2014 Goal Status: Active Patient/caregiver will verbalize/demonstrate measures taken to prevent injury and/or falls Date Initiated: 12/23/2014 Goal Status: Active Patient/caregiver  will verbalize/demonstrate understanding of what to do in case of emergency Date Initiated: 12/23/2014 Goal Status: Active Interventions: Assess fall risk on admission and as needed Assess: immobility, friction, shearing, incontinence upon admission and as needed Assess impairment of mobility on admission and as needed per policy Assess self care needs on admission and as needed Provide education on fall prevention Notes: Wound/Skin Impairment Nursing Diagnoses: Impaired tissue integrity Knowledge deficit related to ulceration/compromised skin integrity Goals: Patient/caregiver will verbalize understanding of skin care regimen SAVON, GIUFFRIDA ([id number]) Date Initiated: 12/23/2014 Goal Status: Active Ulcer/skin breakdown will heal within 14 weeks Date Initiated: 12/23/2014 Goal Status: Active Interventions: Assess patient/caregiver ability to obtain necessary supplies Assess patient/caregiver ability to perform ulcer/skin care regimen upon admission and as needed Assess ulceration(s) every visit Provide education on ulcer and skin care Treatment Activities: Skin care regimen initiated : 11/05/2015 Topical wound management initiated : 11/05/2015 Notes: Electronic Signature(s) Signed: 12/25/2015 4:33:28 PM By: Gretta Cool, RN, [name], Kim RN, [name] Entered By: Gretta Cool, RN, [name], Kim on 12/25/2015 10:03:07 Viloria, [name] ([id number]) -------------------------------------------------------------------------------- Pain Assessment Details [name]. Date of Service: 12/25/2015 10:00 AM Medical Record Number: [id number] Patient Account Number: [id number] Date of Birth/Sex: 05-18-1956 (60 y.o. Female) Treating RN: Cornell Barman Primary Care Physician: [name] Other Clinician: Referring Physician: [name] Treating Physician/Extender: Frann Rider in Treatment: 52 Active Problems Location of Pain Severity and Description of Pain Patient Has Paino No Site  Locations With Dressing Change: No Pain Management and Medication Current Pain Management: Electronic Signature(s) Signed: 12/25/2015 4:33:28 PM By: Gretta Cool, RN, [name], Kim RN, [name] Entered By: Gretta Cool, RN, [name], Kim on 12/25/2015 09:56:50 Donnell, [name] ([id number]) -------------------------------------------------------------------------------- Patient/Caregiver Education Details [name]. Date of Service: 12/25/2015 10:00 AM Medical Record Number: [id number] Patient Account Number: [id number] Date of Birth/Gender: 02-Dec-1955 (60 y.o. Female) Treating RN: Cornell Barman Primary Care Physician: [name] Other Clinician: Referring Physician: [name] Treating Physician/Extender: Frann Rider in Treatment: 61 Education Assessment Education Provided To: Patient Education Topics Provided Wound/Skin Impairment: Handouts: Caring for Your Ulcer Methods: Demonstration, Explain/Verbal Responses: State content correctly Electronic Signature(s) Signed: 12/25/2015 4:33:28 PM By: Gretta Cool, RN, [name], Kim RN, [name] Entered By: Gretta Cool, RN, [name], Kim on 12/25/2015 10:29:54 Rauber, Vaughan Basta  Chauncey Cruel ([id number]) -------------------------------------------------------------------------------- Wound Assessment Details Patient Name: JANACIA, GENOVA. Date of Service: 12/25/2015 10:00 AM Medical Record Number: [id number] Patient Account Number: [id number] Date of Birth/Sex: 18-Feb-1956 (60 y.o. Female) Treating RN: Cornell Barman Primary Care Physician: [name] Other Clinician: Referring Physician: [name] Treating Physician/Extender: Frann Rider in Treatment: 14 Wound Status Wound Number: 1 Primary Pressure Ulcer Etiology: Wound Location: Right Back Wound Open Wounding Event: Blister Status: Date Acquired: 10/19/2013 Comorbid Glaucoma, Anemia, Coronary Artery Weeks Of Treatment: 52 History: Disease, Hypertension, Type II Clustered Wound: No Diabetes, End Stage Renal  Disease, Osteoarthritis, Neuropathy Photos Photo Uploaded By: Gretta Cool, RN, [name], Kim on 12/25/2015 10:01:38 Wound Measurements Length: (cm) 3.5 Width: (cm) 5 Depth: (cm) 0.1 Area: (cm) 13.744 Volume: (cm) 1.374 % Reduction in Area: 64.3% % Reduction in Volume: 64.3% Epithelialization: None Tunneling: No Undermining: No Wound Description Classification: Category/Stage II Wound Margin: Distinct, outline attached Exudate Amount: Medium Exudate Type: Serosanguineous Exudate Color: red, brown Foul Odor After Cleansing: No Wound Bed Granulation Amount: Large (67-100%) Exposed Structure Granulation Quality: Red, Friable Fascia Exposed: No Necrotic Amount: Small (1-33%) Fat Layer Exposed: No Yoshida, Cherisa S. ([id number]) Necrotic Quality: Adherent Slough Tendon Exposed: No Muscle Exposed: No Joint Exposed: No Bone Exposed: No Limited to Skin Breakdown Periwound Skin Texture Texture Color No Abnormalities Noted: No No Abnormalities Noted: No Callus: No Atrophie Blanche: No Crepitus: No Cyanosis: No Excoriation: No Ecchymosis: No Fluctuance: No Erythema: No Friable: Yes Hemosiderin Staining: No Induration: No Mottled: No Localized Edema: No Pallor: No Rash: No Rubor: No Scarring: Yes Temperature / Pain Moisture Temperature: No Abnormality No Abnormalities Noted: No Dry / Scaly: No Maceration: No Moist: Yes Wound Preparation Ulcer Cleansing: Rinsed/Irrigated with Saline Topical Anesthetic Applied: None Treatment Notes Wound #1 (Right Back) 1. Cleansed with: Clean wound with Normal Saline 3. Peri-wound Care: Skin Prep 4. Dressing Applied: Other dressing (specify in notes) 5. Secondary Dressing Applied Bordered Foam Dressing Notes RTD Electronic Signature(s) Signed: 12/25/2015 4:33:28 PM By: Gretta Cool, RN, [name], Kim RN, [name] Entered By: Gretta Cool, RN, [name], Kim on 12/25/2015 10:00:43 Glendenning, [name]  ([id number]) -------------------------------------------------------------------------------- Vitals Details [name]. Date of Service: 12/25/2015 10:00 AM Medical Record Number: [id number] Patient Account Number: [id number] Date of Birth/Sex: 17-Mar-1956 (60 y.o. Female) Treating RN: Cornell Barman Primary Care Physician: [name] Other Clinician: Referring Physician: [name] Treating Physician/Extender: Frann Rider in Treatment: 48 Vital Signs Time Taken: 09:56 Temperature (F): 98.6 Height (in): 65 Pulse (bpm): 88 Weight (lbs): 156 Respiratory Rate (breaths/min): 18 Body Mass Index (BMI): 26 Blood Pressure (mmHg): 168/70 Reference Range: 80 - 120 mg / dl Electronic Signature(s) Signed: 12/25/2015 4:33:28 PM By: Gretta Cool, RN, [name], Kim RN, [name] Entered By: Gretta Cool, RN, [name], Kim on 12/25/2015 09:57:09

## 2015-12-28 NOTE — Progress Notes (Signed)
[name], [name] ([id number]) Visit Report for 12/25/2015 Chief Complaint Document Details [name], [name]. Date of Service: 12/25/2015 10:00 AM Medical Record Number: [id number] Patient Account Number: [id number] Date of Birth/Sex: 1956/05/03 (60 y.o. Female) Treating RN: Cornell Barman Primary Care Physician: [name] Other Clinician: Referring Physician: [name] Treating Physician/Extender: Frann Rider in Treatment: 33 Information Obtained from: Patient Chief Complaint Patient presents to the wound care center for a consult due non healing wound. She has an open wound on her right upper back which she's had for about a year and she recently noticed a blister on her right lower extremity about 2 weeks ago. Electronic Signature(s) Signed: 12/25/2015 10:43:01 AM By: [name], [name] Entered By: [name] on 12/25/2015 10:43:01 [name], [name] ([id number]) -------------------------------------------------------------------------------- HPI Details [name], [name]. Date of Service: 12/25/2015 10:00 AM Medical Record Number: [id number] Patient Account Number: [id number] Date of Birth/Sex: 1955/10/29 (60 y.o. Female) Treating RN: Cornell Barman Primary Care Physician: [name] Other Clinician: Referring Physician: [name] Treating Physician/Extender: Frann Rider in Treatment: 2 History of Present Illness Location: right upper back and right lower extremity wounds Quality: Patient reports No Pain. Severity: Patient states wound (s) are getting better. Duration: Patient has had the wound for > 3 months prior to seeking treatment at the wound center Timing: she thought it first occurred when she was using a heating pad about a year ago after back surgery. Context: The wound appeared gradually over time Modifying Factors: Patient is currently on renal dialysis and receives treatments 3 times weekly Associated Signs and Symptoms:  Patient reports having: surgery scheduled for this week for a AV fistula left arm. HPI Description: 60 year old patient who is known to be a diabetic and has end-stage renal disease has had several comorbidities including coronary artery disease, hypertension, hyperlipidemia, pancreatitis, anemia, previous history of hysterectomy, cholecystectomy, left-sided salivary gland excision, bilateral cataract surgery,Peritoneal dialysis catheter, hemodialysis catheter. the area on the back has also been caused by instant pressure she used to sleep on a recliner all day and has significant kyphoscoliosis. As far as the wound on her right lower extremity she's not sure how this blister occurred but she thought it has been there for about 2 weeks. No recent blood investigations available and no recent hemoglobin A1c. 12/30/2014 -- she is an assisted living facility but I believe the nurses that have not followed instructions as she had some cream applied on her back and there was a different dressing. Last week she's had a AV fistula placed on her left forearm. 01/13/2015 -- she has had some localized infection at the port site and she's been on doxycycline for this. 02/05/2015 - he has developed a small blister on her right lower extremity. 02/10/2015 -- she has developed another small blister on her right anterior chest wall in the area where she's had tape for her dialysis access. this may just be injury caused by a tape burn. 02/18/2015 -- no new blisters and she had a dermatology opinion and they have taken a biopsy of her skin. She also had a left brachial AV fistula placed this week. 03/03/2015 -- though we do not have the pathology report yet the patient says she has been put on prednisone because this skin disease is possibly to do with her immune system and her dermatologist is recommended this. 03/10/2015 -- she has developed a new blister which is quite large on her right lower extremity on  the shin.  05/22/2015 -- she did very well with her dressing but on trying to remove the [place] it peels away the newly healed skin. [name], [name] ([id number]) 06/05/2015 -- the patient was in the ER this past week for severe bleeding from the right nostril and had to have ENT see her and do a packing. They've also been holding her heparin during her dialysis. The patient is going back to ENT today. Other than that she has had no other significant issues. 06/19/2015 -- she is back on her blood thinners and continues to have her hemodialysis as before. 07/31/2015 -- a large blister has popped up again on her right lower extremity in the same place in the mid shin area in the anterior lateral compartment. 10/05/2015 -- last week she was admitted to the hospital for 4 days as she had a pneumonia and was treated with IV and oral antibiotics. Electronic Signature(s) Signed: 12/25/2015 10:43:09 AM By: [name], [name] Entered By: [name] on 12/25/2015 10:43:09 [name], [name] ([id number]) -------------------------------------------------------------------------------- Physical Exam Details Patient Name: [place]ie, 56. Date of Service: 12/25/2015 10:00 AM Medical Record Number: [id number] Patient Account Number: [id number] Date of Birth/Sex: [birth year] (60 y.o. Female) Treating RN: Cornell Barman Primary Care Physician: [name] Other Clinician: Referring Physician: [name] Treating Physician/Extender: Frann Rider in Treatment: 52 Constitutional . Pulse regular. Respirations normal and unlabored. Afebrile. . Eyes Nonicteric. Reactive to light. Ears, Nose, Mouth, and Throat Lips, teeth, and gums WNL.Marland Kitchen Moist mucosa without lesions. Neck supple and nontender. No palpable supraclavicular or cervical adenopathy. Normal sized without goiter. Respiratory WNL. No retractions.. Breath sounds WNL, No rubs, rales, rhonchi, or wheeze.. Cardiovascular Heart rhythm and  rate regular, no murmur or gallop.. Pedal Pulses WNL. No clubbing, cyanosis or edema. Chest Breasts symmetical and no nipple discharge.. Breast tissue WNL, no masses, lumps, or tenderness.. Lymphatic No adneopathy. No adenopathy. No adenopathy. Musculoskeletal Adexa without tenderness or enlargement.. Digits and nails w/o clubbing, cyanosis, infection, petechiae, ischemia, or inflammatory conditions.. Integumentary (Hair, Skin) No suspicious lesions. No crepitus or fluctuance. No peri-wound warmth or erythema. No masses.Marland Kitchen Psychiatric Judgement and insight Intact.. No evidence of depression, anxiety, or agitation.. Notes over the last week the wound has opened out again with blebs and drainage and now is a fairly large ulceration with healthy granulation tissue. No debridement was required. Electronic Signature(s) Signed: 12/25/2015 10:43:35 AM By: [name], [name] Entered By: [name] on 12/25/2015 10:43:35 [name], [name] ([id number]) -------------------------------------------------------------------------------- Physician Orders Details [name]. Date of Service: 12/25/2015 10:00 AM Medical Record Number: [id number] Patient Account Number: [id number] Date of Birth/Sex: Feb 25, 1956 (60 y.o. Female) Treating RN: Cornell Barman Primary Care Physician: [name] Other Clinician: Referring Physician: [name] Treating Physician/Extender: Frann Rider in Treatment: 97 Verbal / Phone Orders: Yes Clinician: Cornell Barman Read Back and Verified: Yes Diagnosis Coding Wound Cleansing Wound #1 Right Back o Clean wound with Normal Saline. Skin Barriers/Peri-Wound Care Wound #1 Right Back o Skin Prep Primary Wound Dressing Wound #1 Right Back o Other: - RTD (Patient has additional RTD for dressing change. If no RTD is available just use bordered foam dressing.) Remove dressing with saline if it is sticking to wound. Secondary Dressing Wound #1  Right Back o Boardered Foam Dressing Dressing Change Frequency Wound #1 Right Back o Other: - Change dressing if needed due to drainage. Follow-up Appointments Wound #1 Right Back o Return Appointment in 1 week. Electronic Signature(s) Signed: 12/25/2015 4:33:28 PM  By: Gretta Cool, RN, [name], Kim RN, [name] Signed: 12/28/2015 8:10:40 AM By: [name], [name] Entered By: Gretta Cool RN, [name], Kim on 12/25/2015 10:26:58 [name] ([id number]) -------------------------------------------------------------------------------- Problem List Details [name], [name]. Date of Service: 12/25/2015 10:00 AM Medical Record Number: [id number] Patient Account Number: [id number] Date of Birth/Sex: 1955-12-07 (60 y.o. Female) Treating RN: Cornell Barman Primary Care Physician: [name] Other Clinician: Referring Physician: [name] Treating Physician/Extender: Frann Rider in Treatment: 16 Active Problems ICD-10 Encounter Code Description Active Date Diagnosis E11.622 Type 2 diabetes mellitus with other skin ulcer 12/23/2014 Yes L89.112 Pressure ulcer of right upper back, stage 2 12/23/2014 Yes S80.821A Blister (nonthermal), right lower leg, initial encounter 12/23/2014 Yes N18.6 End stage renal disease 12/23/2014 Yes Inactive Problems Resolved Problems Electronic Signature(s) Signed: 12/25/2015 10:42:56 AM By: [name], [name] Entered By: [name] on 12/25/2015 10:42:56 [name], [name] ([id number]) -------------------------------------------------------------------------------- Progress Note Details [name], [name] Date of Service: 12/25/2015 10:00 AM Medical Record Number: [id number] Patient Account Number: [id number] Date of Birth/Sex: Mar 20, 1956 (60 y.o. Female) Treating RN: Cornell Barman Primary Care Physician: [name] Other Clinician: Referring Physician: [name] Treating Physician/Extender: Frann Rider in Treatment:  45 Subjective Chief Complaint Information obtained from Patient Patient presents to the wound care center for a consult due non healing wound. She has an open wound on her right upper back which she's had for about a year and she recently noticed a blister on her right lower extremity about 2 weeks ago. History of Present Illness (HPI) The following HPI elements were documented for the patient's wound: Location: right upper back and right lower extremity wounds Quality: Patient reports No Pain. Severity: Patient states wound (s) are getting better. Duration: Patient has had the wound for > 3 months prior to seeking treatment at the wound center Timing: she thought it first occurred when she was using a heating pad about a year ago after back surgery. Context: The wound appeared gradually over time Modifying Factors: Patient is currently on renal dialysis and receives treatments 3 times weekly Associated Signs and Symptoms: Patient reports having: surgery scheduled for this week for a AV fistula left arm. 60 year old patient who is known to be a diabetic and has end-stage renal disease has had several comorbidities including coronary artery disease, hypertension, hyperlipidemia, pancreatitis, anemia, previous history of hysterectomy, cholecystectomy, left-sided salivary gland excision, bilateral cataract surgery,Peritoneal dialysis catheter, hemodialysis catheter. the area on the back has also been caused by instant pressure she used to sleep on a recliner all day and has significant kyphoscoliosis. As far as the wound on her right lower extremity she's not sure how this blister occurred but she thought it has been there for about 2 weeks. No recent blood investigations available and no recent hemoglobin A1c. 12/30/2014 -- she is an assisted living facility but I believe the nurses that have not followed instructions as she had some cream applied on her back and there was a different  dressing. Last week she's had a AV fistula placed on her left forearm. 01/13/2015 -- she has had some localized infection at the port site and she's been on doxycycline for this. 02/05/2015 - he has developed a small blister on her right lower extremity. 02/10/2015 -- she has developed another small blister on her right anterior chest wall in the area where she's had tape for her dialysis access. this may just be injury caused by a tape burn. 02/18/2015 -- no new blisters  and she had a dermatology opinion and they have taken a biopsy of her skin. [name], [name] ([id number]) She also had a left brachial AV fistula placed this week. 03/03/2015 -- though we do not have the pathology report yet the patient says she has been put on prednisone because this skin disease is possibly to do with her immune system and her dermatologist is recommended this. 03/10/2015 -- she has developed a new blister which is quite large on her right lower extremity on the shin. 05/22/2015 -- she did very well with her dressing but on trying to remove the [place] it peels away the newly healed skin. 06/05/2015 -- the patient was in the ER this past week for severe bleeding from the right nostril and had to have ENT see her and do a packing. They've also been holding her heparin during her dialysis. The patient is going back to ENT today. Other than that she has had no other significant issues. 06/19/2015 -- she is back on her blood thinners and continues to have her hemodialysis as before. 07/31/2015 -- a large blister has popped up again on her right lower extremity in the same place in the mid shin area in the anterior lateral compartment. 10/05/2015 -- last week she was admitted to the hospital for 4 days as she had a pneumonia and was treated with IV and oral antibiotics. Objective Constitutional Pulse regular. Respirations normal and unlabored. Afebrile. Vitals Time Taken: 9:56 AM, Height: 65 in, Weight:  156 lbs, BMI: 26, Temperature: 98.6 F, Pulse: 88 bpm, Respiratory Rate: 18 breaths/min, Blood Pressure: 168/70 mmHg. Eyes Nonicteric. Reactive to light. Ears, Nose, Mouth, and Throat Lips, teeth, and gums WNL.Marland Kitchen Moist mucosa without lesions. Neck supple and nontender. No palpable supraclavicular or cervical adenopathy. Normal sized without goiter. Respiratory WNL. No retractions.. Breath sounds WNL, No rubs, rales, rhonchi, or wheeze.. Cardiovascular Heart rhythm and rate regular, no murmur or gallop.. Pedal Pulses WNL. No clubbing, cyanosis or edema. Chest Breasts symmetical and no nipple discharge.. Breast tissue WNL, no masses, lumps, or tenderness.[name] ([id number]) Lymphatic No adneopathy. No adenopathy. No adenopathy. Musculoskeletal Adexa without tenderness or enlargement.. Digits and nails w/o clubbing, cyanosis, infection, petechiae, ischemia, or inflammatory conditions.Marland Kitchen Psychiatric Judgement and insight Intact.. No evidence of depression, anxiety, or agitation.. General Notes: over the last week the wound has opened out again with blebs and drainage and now is a fairly large ulceration with healthy granulation tissue. No debridement was required. Integumentary (Hair, Skin) No suspicious lesions. No crepitus or fluctuance. No peri-wound warmth or erythema. No masses.. Wound #1 status is Open. Original cause of wound was Blister. The wound is located on the Right Back. The wound measures 3.5cm length x 5cm width x 0.1cm depth; 13.744cm^2 area and 1.374cm^3 volume. The wound is limited to skin breakdown. There is no tunneling or undermining noted. There is a medium amount of serosanguineous drainage noted. The wound margin is distinct with the outline attached to the wound base. There is large (67-100%) red, friable granulation within the wound bed. There is a small (1-33%) amount of necrotic tissue within the wound bed including Adherent Slough. The periwound  skin appearance exhibited: Friable, Scarring, Moist. The periwound skin appearance did not exhibit: Callus, Crepitus, Excoriation, Fluctuance, Induration, Localized Edema, Rash, Dry/Scaly, Maceration, Atrophie Blanche, Cyanosis, Ecchymosis, Hemosiderin Staining, Mottled, Pallor, Rubor, Erythema. Periwound temperature was noted as No Abnormality. Assessment Active Problems ICD-10 E11.622 - Type 2 diabetes mellitus with other skin ulcer L89.112 -  Pressure ulcer of right upper back, stage 2 S80.821A - Blister (nonthermal), right lower leg, initial encounter N18.6 - End stage renal disease Plan Wound Cleansing: [name], [name] ([id number]) Wound #1 Right Back: Clean wound with Normal Saline. Skin Barriers/Peri-Wound Care: Wound #1 Right Back: Skin Prep Primary Wound Dressing: Wound #1 Right Back: Other: - RTD (Patient has additional RTD for dressing change. If no RTD is available just use bordered foam dressing.) Remove dressing with saline if it is sticking to wound. Secondary Dressing: Wound #1 Right Back: Boardered Foam Dressing Dressing Change Frequency: Wound #1 Right Back: Other: - Change dressing if needed due to drainage. Follow-up Appointments: Wound #1 Right Back: Return Appointment in 1 week. 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 We will use RTD and a foam dressing over this and change it as required. She will come back to see me next week Electronic Signature(s) Signed: 12/25/2015 10:44:17 AM By: [name], [name] Entered By: [name] on 12/25/2015 10:44:16 [name], [name] ([id number]) -------------------------------------------------------------------------------- SuperBill Details [name]. Date of Service: 12/25/2015 Medical Record Number: [id number] Patient Account Number: [id number] Date of Birth/Sex: August 20, 1955 (60 y.o.  Female) Treating RN: Cornell Barman Primary Care Physician: [name] Other Clinician: Referring Physician: [name] Treating Physician/Extender: Frann Rider in Treatment: 52 Diagnosis Coding ICD-10 Codes Code Description E11.622 Type 2 diabetes mellitus with other skin ulcer L89.112 Pressure ulcer of right upper back, stage 2 S80.821A Blister (nonthermal), right lower leg, initial encounter N18.6 End stage renal disease Facility Procedures CPT4 Code: [id number] Description: [phone] - WOUND CARE VISIT-LEV 2 EST PT Modifier: Quantity: 1 Physician Procedures CPT4 Code: [id number] Description: [id number] - WC PHYS LEVEL 3 - EST PT ICD-10 Description Diagnosis E11.622 Type 2 diabetes mellitus with other skin ulcer L89.112 Pressure ulcer of right upper back, stage 2 S80.821A Blister (nonthermal), right lower leg, initial enc N18.6 End  stage renal disease Modifier: ounter Quantity: 1 Electronic Signature(s) Signed: 12/25/2015 10:44:30 AM By: [name], [name] Entered By: [name] on 12/25/2015 10:44:30

## 2016-01-01 ENCOUNTER — Encounter: Payer: Medicare Other | Admitting: Surgery

## 2016-01-01 DIAGNOSIS — E11622 Type 2 diabetes mellitus with other skin ulcer: Secondary | ICD-10-CM | POA: Diagnosis not present

## 2016-01-01 NOTE — Progress Notes (Addendum)
JINX, CARROZZA ([id number]) Visit Report for 01/01/2016 Chief Complaint Document Details [name], [name]. Date of Service: 01/01/2016 9:15 AM Medical Record Number: [id number] Patient Account Number: [id number] Date of Birth/Sex: 14-Jan-1956 (60 y.o. Female) Treating RN: Cornell Barman Primary Care Physician: [name] Other Clinician: Referring Physician: [name] Treating Physician/Extender: Frann Rider in Treatment: 27 Information Obtained from: Patient Chief Complaint Patient presents to the wound care center for a consult due non healing wound. She has an open wound on her right upper back which she's had for about a year and she recently noticed a blister on her right lower extremity about 2 weeks ago. Electronic Signature(s) Signed: 01/01/2016 10:03:36 AM By: [name], [name] Entered By: [name] on 01/01/2016 10:03:36 [place], [name] ([id number]) -------------------------------------------------------------------------------- HPI Details [name], [name]. Date of Service: 01/01/2016 9:15 AM Medical Record Number: [id number] Patient Account Number: [id number] Date of Birth/Sex: 04-09-1956 (60 y.o. Female) Treating RN: Cornell Barman Primary Care Physician: [name] Other Clinician: Referring Physician: [name] Treating Physician/Extender: Frann Rider in Treatment: 31 History of Present Illness Location: right upper back and right lower extremity wounds Quality: Patient reports No Pain. Severity: Patient states wound (s) are getting better. Duration: Patient has had the wound for > 3 months prior to seeking treatment at the wound center Timing: she thought it first occurred when she was using a heating pad about a year ago after back surgery. Context: The wound appeared gradually over time Modifying Factors: Patient is currently on renal dialysis and receives treatments 3 times weekly Associated Signs and Symptoms:  Patient reports having: surgery scheduled for this week for a AV fistula left arm. HPI Description: 60 year old patient who is known to be a diabetic and has end-stage renal disease has had several comorbidities including coronary artery disease, hypertension, hyperlipidemia, pancreatitis, anemia, previous history of hysterectomy, cholecystectomy, left-sided salivary gland excision, bilateral cataract surgery,Peritoneal dialysis catheter, hemodialysis catheter. the area on the back has also been caused by instant pressure she used to sleep on a recliner all day and has significant kyphoscoliosis. As far as the wound on her right lower extremity she's not sure how this blister occurred but she thought it has been there for about 2 weeks. No recent blood investigations available and no recent hemoglobin A1c. 12/30/2014 -- she is an assisted living facility but I believe the nurses that have not followed instructions as she had some cream applied on her back and there was a different dressing. Last week she's had a AV fistula placed on her left forearm. 01/13/2015 -- she has had some localized infection at the port site and she's been on doxycycline for this. 02/05/2015 - he has developed a small blister on her right lower extremity. 02/10/2015 -- she has developed another small blister on her right anterior chest wall in the area where she's had tape for her dialysis access. this may just be injury caused by a tape burn. 02/18/2015 -- no new blisters and she had a dermatology opinion and they have taken a biopsy of her skin. She also had a left brachial AV fistula placed this week. 03/03/2015 -- though we do not have the pathology report yet the patient says she has been put on prednisone because this skin disease is possibly to do with her immune system and her dermatologist is recommended this. 03/10/2015 -- she has developed a new blister which is quite large on her right lower extremity on  the shin.  05/22/2015 -- she did very well with her dressing but on trying to remove the [place] it peels away the newly healed skin. [name], [name] ([id number]) 06/05/2015 -- the patient was in the ER this past week for severe bleeding from the right nostril and had to have ENT see her and do a packing. They've also been holding her heparin during her dialysis. The patient is going back to ENT today. Other than that she has had no other significant issues. 06/19/2015 -- she is back on her blood thinners and continues to have her hemodialysis as before. 07/31/2015 -- a large blister has popped up again on her right lower extremity in the same place in the mid shin area in the anterior lateral compartment. 10/05/2015 -- last week she was admitted to the hospital for 4 days as she had a pneumonia and was treated with IV and oral antibiotics. Electronic Signature(s) Signed: 01/01/2016 10:03:44 AM By: [name], [name] Entered By: [name] on 01/01/2016 10:03:44 Baranek, [name] ([id number]) -------------------------------------------------------------------------------- Physical Exam Details [name], [name]. Date of Service: 01/01/2016 9:15 AM Medical Record Number: [id number] Patient Account Number: [id number] Date of Birth/Sex: October 12, 1955 (60 y.o. Female) Treating RN: Cornell Barman Primary Care Physician: [name] Other Clinician: Referring Physician: [name] Treating Physician/Extender: Frann Rider in Treatment: 23 Constitutional . Pulse regular. Respirations normal and unlabored. Afebrile. . Eyes Nonicteric. Reactive to light. Ears, Nose, Mouth, and Throat Lips, teeth, and gums WNL.Marland Kitchen Moist mucosa without lesions. Neck supple and nontender. No palpable supraclavicular or cervical adenopathy. Normal sized without goiter. Respiratory WNL. No retractions.. Breath sounds WNL, No rubs, rales, rhonchi, or wheeze.. Cardiovascular Heart rhythm and  rate regular, no murmur or gallop.. Pedal Pulses WNL. No clubbing, cyanosis or edema. Lymphatic No adneopathy. No adenopathy. No adenopathy. Musculoskeletal Adexa without tenderness or enlargement.. Digits and nails w/o clubbing, cyanosis, infection, petechiae, ischemia, or inflammatory conditions.. Integumentary (Hair, Skin) No suspicious lesions. No crepitus or fluctuance. No peri-wound warmth or erythema. No masses.Marland Kitchen Psychiatric Judgement and insight Intact.. No evidence of depression, anxiety, or agitation.. Notes since last week the wound is looking better has no hyper granulation tissue and the surrounding skin is looking a little firmer. Electronic Signature(s) Signed: 01/01/2016 10:04:17 AM By: [name], [name] Entered By: [name] on 01/01/2016 10:04:17 [name], [name] ([id number]) -------------------------------------------------------------------------------- Physician Orders Details [name]. Date of Service: 01/01/2016 9:15 AM Medical Record Number: [id number] Patient Account Number: [id number] Date of Birth/Sex: 1955-09-19 (60 y.o. Female) Treating RN: Cornell Barman Primary Care Physician: [name] Other Clinician: Referring Physician: [name] Treating Physician/Extender: Frann Rider in Treatment: 57 Verbal / Phone Orders: Yes Clinician: Cornell Barman Read Back and Verified: Yes Diagnosis Coding ICD-10 Coding Code Description E11.622 Type 2 diabetes mellitus with other skin ulcer L89.112 Pressure ulcer of right upper back, stage 2 S80.821A Blister (nonthermal), right lower leg, initial encounter N18.6 End stage renal disease Wound Cleansing Wound #1 Right Back o Clean wound with Normal Saline. Skin Barriers/Peri-Wound Care Wound #1 Right Back o Skin Prep Primary Wound Dressing Wound #1 Right Back o Cutimed Siltec Sorbact Dressing Change Frequency Wound #1 Right Back o Other: - Change dressing if needed due  to drainage. Follow-up Appointments Wound #1 Right Back o Return Appointment in 1 week. Electronic Signature(s) Signed: 01/01/2016 4:58:26 PM By: [name], [name] Signed: 01/01/2016 5:27:00 PM By: Gretta Cool RN, [name], Kim RN, [name] Entered By: Gretta Cool, RN, [name], Kim on 01/01/2016  10:04:14 [name], [name] ([id number]) -------------------------------------------------------------------------------- Problem List Details [name], [name]. Date of Service: 01/01/2016 9:15 AM Medical Record Number: [id number] Patient Account Number: [id number] Date of Birth/Sex: 07-Dec-1955 (60 y.o. Female) Treating RN: Cornell Barman Primary Care Physician: [name] Other Clinician: Referring Physician: [name] Treating Physician/Extender: Frann Rider in Treatment: 55 Active Problems ICD-10 Encounter Code Description Active Date Diagnosis E11.622 Type 2 diabetes mellitus with other skin ulcer 12/23/2014 Yes L89.112 Pressure ulcer of right upper back, stage 2 12/23/2014 Yes S80.821A Blister (nonthermal), right lower leg, initial encounter 12/23/2014 Yes N18.6 End stage renal disease 12/23/2014 Yes Inactive Problems Resolved Problems Electronic Signature(s) Signed: 01/01/2016 10:03:24 AM By: [name], [name] Entered By: [name] on 01/01/2016 10:03:24 [name], [name] ([id number]) -------------------------------------------------------------------------------- Progress Note Details [name], [name] Date of Service: 01/01/2016 9:15 AM Medical Record Number: [id number] Patient Account Number: [id number] Date of Birth/Sex: 1955/10/01 (60 y.o. Female) Treating RN: Cornell Barman Primary Care Physician: [name] Other Clinician: Referring Physician: [name] Treating Physician/Extender: Frann Rider in Treatment: 38 Subjective Chief Complaint Information obtained from Patient Patient presents to the wound care center for a consult due non healing wound.  She has an open wound on her right upper back which she's had for about a year and she recently noticed a blister on her right lower extremity about 2 weeks ago. History of Present Illness (HPI) The following HPI elements were documented for the patient's wound: Location: right upper back and right lower extremity wounds Quality: Patient reports No Pain. Severity: Patient states wound (s) are getting better. Duration: Patient has had the wound for > 3 months prior to seeking treatment at the wound center Timing: she thought it first occurred when she was using a heating pad about a year ago after back surgery. Context: The wound appeared gradually over time Modifying Factors: Patient is currently on renal dialysis and receives treatments 3 times weekly Associated Signs and Symptoms: Patient reports having: surgery scheduled for this week for a AV fistula left arm. 60 year old patient who is known to be a diabetic and has end-stage renal disease has had several comorbidities including coronary artery disease, hypertension, hyperlipidemia, pancreatitis, anemia, previous history of hysterectomy, cholecystectomy, left-sided salivary gland excision, bilateral cataract surgery,Peritoneal dialysis catheter, hemodialysis catheter. the area on the back has also been caused by instant pressure she used to sleep on a recliner all day and has significant kyphoscoliosis. As far as the wound on her right lower extremity she's not sure how this blister occurred but she thought it has been there for about 2 weeks. No recent blood investigations available and no recent hemoglobin A1c. 12/30/2014 -- she is an assisted living facility but I believe the nurses that have not followed instructions as she had some cream applied on her back and there was a different dressing. Last week she's had a AV fistula placed on her left forearm. 01/13/2015 -- she has had some localized infection at the port site and she's  been on doxycycline for this. 02/05/2015 - he has developed a small blister on her right lower extremity. 02/10/2015 -- she has developed another small blister on her right anterior chest wall in the area where she's had tape for her dialysis access. this may just be injury caused by a tape burn. 02/18/2015 -- no new blisters and she had a dermatology opinion and they have taken a biopsy of her skin. [name] ([id number]) She also had a left  brachial AV fistula placed this week. 03/03/2015 -- though we do not have the pathology report yet the patient says she has been put on prednisone because this skin disease is possibly to do with her immune system and her dermatologist is recommended this. 03/10/2015 -- she has developed a new blister which is quite large on her right lower extremity on the shin. 05/22/2015 -- she did very well with her dressing but on trying to remove the [place] it peels away the newly healed skin. 06/05/2015 -- the patient was in the ER this past week for severe bleeding from the right nostril and had to have ENT see her and do a packing. They've also been holding her heparin during her dialysis. The patient is going back to ENT today. Other than that she has had no other significant issues. 06/19/2015 -- she is back on her blood thinners and continues to have her hemodialysis as before. 07/31/2015 -- a large blister has popped up again on her right lower extremity in the same place in the mid shin area in the anterior lateral compartment. 10/05/2015 -- last week she was admitted to the hospital for 4 days as she had a pneumonia and was treated with IV and oral antibiotics. Objective Constitutional Pulse regular. Respirations normal and unlabored. Afebrile. Vitals Time Taken: 9:45 AM, Height: 65 in, Weight: 156 lbs, BMI: 26, Temperature: 98.4 F, Pulse: 82 bpm, Blood Pressure: 161/70 mmHg. Eyes Nonicteric. Reactive to light. Ears, Nose, Mouth, and  Throat Lips, teeth, and gums WNL.Marland Kitchen Moist mucosa without lesions. Neck supple and nontender. No palpable supraclavicular or cervical adenopathy. Normal sized without goiter. Respiratory WNL. No retractions.. Breath sounds WNL, No rubs, rales, rhonchi, or wheeze.. Cardiovascular Heart rhythm and rate regular, no murmur or gallop.. Pedal Pulses WNL. No clubbing, cyanosis or edema. Lymphatic No adneopathy. No adenopathy. No adenopathy. [name] ([id number]) Musculoskeletal Adexa without tenderness or enlargement.. Digits and nails w/o clubbing, cyanosis, infection, petechiae, ischemia, or inflammatory conditions.Marland Kitchen Psychiatric Judgement and insight Intact.. No evidence of depression, anxiety, or agitation.. General Notes: since last week the wound is looking better has no hyper granulation tissue and the surrounding skin is looking a little firmer. Integumentary (Hair, Skin) No suspicious lesions. No crepitus or fluctuance. No peri-wound warmth or erythema. No masses.. Wound #1 status is Open. Original cause of wound was Blister. The wound is located on the Right Back. The wound measures 2.8cm length x 2.5cm width x 0.1cm depth; 5.498cm^2 area and 0.55cm^3 volume. The wound is limited to skin breakdown. There is no tunneling or undermining noted. There is a medium amount of serosanguineous drainage noted. The wound margin is distinct with the outline attached to the wound base. There is large (67-100%) red, friable granulation within the wound bed. There is no necrotic tissue within the wound bed. The periwound skin appearance exhibited: Friable, Scarring, Moist. The periwound skin appearance did not exhibit: Callus, Crepitus, Excoriation, Fluctuance, Induration, Localized Edema, Rash, Dry/Scaly, Maceration, Atrophie Blanche, Cyanosis, Ecchymosis, Hemosiderin Staining, Mottled, Pallor, Rubor, Erythema. Periwound temperature was noted as No Abnormality. Assessment Active  Problems ICD-10 E11.622 - Type 2 diabetes mellitus with other skin ulcer L89.112 - Pressure ulcer of right upper back, stage 2 S80.821A - Blister (nonthermal), right lower leg, initial encounter N18.6 - End stage renal disease We will use Siltec Sorbact foam dressing over this and change it as required. She will come back to see me next week Plan [name], [name]. ([id number]) Wound Cleansing: Wound #1 Right  Back: Clean wound with Normal Saline. Skin Barriers/Peri-Wound Care: Wound #1 Right Back: Skin Prep Primary Wound Dressing: Wound #1 Right Back: Cutimed Siltec Sorbact Dressing Change Frequency: Wound #1 Right Back: Other: - Change dressing if needed due to drainage. Follow-up Appointments: Wound #1 Right Back: Return Appointment in 1 week. We will use Siltec Sorbact foam dressing over this and change it as required. She will come back to see me next week Electronic Signature(s) Signed: 01/01/2016 10:04:54 AM By: [name], [name] Entered By: [name] on 01/01/2016 10:04:53 [name], [name] ([id number]) -------------------------------------------------------------------------------- SuperBill Details [name]. Date of Service: 01/01/2016 Medical Record Number: [id number] Patient Account Number: [id number] Date of Birth/Sex: [birth year] (60 y.o. Female) Treating RN: Cornell Barman Primary Care Physician: [name] Other Clinician: Referring Physician: [name] Treating Physician/Extender: Frann Rider in Treatment: 53 Diagnosis Coding ICD-10 Codes Code Description E11.622 Type 2 diabetes mellitus with other skin ulcer L89.112 Pressure ulcer of right upper back, stage 2 S80.821A Blister (nonthermal), right lower leg, initial encounter N18.6 End stage renal disease Facility Procedures CPT4 Code: [id number] Description: [phone] - WOUND CARE VISIT-LEV 2 EST PT Modifier: Quantity: 1 Physician Procedures CPT4 Code: [id number] Description:  [id number] - WC PHYS LEVEL 3 - EST PT ICD-10 Description Diagnosis E11.622 Type 2 diabetes mellitus with other skin ulcer L89.112 Pressure ulcer of right upper back, stage 2 S80.821A Blister (nonthermal), right lower leg, initial enc N18.6 End  stage renal disease Modifier: ounter Quantity: 1 Electronic Signature(s) Signed: 01/01/2016 10:05:19 AM By: [name], [name] Entered By: [name] on 01/01/2016 10:05:18

## 2016-01-02 NOTE — Progress Notes (Signed)
KALI, ARITA ([id number]) Visit Report for 01/01/2016 Arrival Information Details [name], [name]. Date of Service: 01/01/2016 9:15 AM Medical Record Number: [id number] Patient Account Number: [id number] Date of Birth/Sex: 12-28-1955 (60 y.o. Female) Treating RN: Cornell Barman Primary Care Physician: [name] Other Clinician: Referring Physician: [name] Treating Physician/Extender: Frann Rider in Treatment: 56 Visit Information History Since Last Visit Has Dressing in Place as Prescribed: Yes Patient Arrived: Ambulatory Has Compression in Place as Prescribed: Yes Arrival Time: 09:47 Pain Present Now: No Transfer Assistance: None Patient Requires Transmission- No Based Precautions: Patient Has Alerts: Yes Patient Alerts: Patient on Blood Thinner Electronic Signature(s) Signed: 01/01/2016 2:08:36 PM By: Jeri Cos PA-C Entered By: Jeri Cos on 01/01/2016 09:52:29 Dejager, [name] ([id number]) -------------------------------------------------------------------------------- Clinic Level of Care Assessment Details Patient Name: Moffat, [name] Date of Service: 01/01/2016 9:15 AM Medical Record Number: [id number] Patient Account Number: [id number] Date of Birth/Sex: April 16, 1956 (60 y.o. Female) Treating RN: Cornell Barman Primary Care Physician: [name] Other Clinician: Referring Physician: [name] Treating Physician/Extender: Frann Rider in Treatment: 55 Clinic Level of Care Assessment Items TOOL 4 Quantity Score []  - Use when only an EandM is performed on FOLLOW-UP visit 0 ASSESSMENTS - Nursing Assessment / Reassessment []  - Reassessment of Co-morbidities (includes updates in patient status) 0 X - Reassessment of Adherence to Treatment Plan 1 5 ASSESSMENTS - Wound and Skin Assessment / Reassessment X - Simple Wound Assessment / Reassessment - one wound 1 5 []  - Complex Wound Assessment / Reassessment - multiple wounds 0 []  -  Dermatologic / Skin Assessment (not related to wound area) 0 ASSESSMENTS - Focused Assessment []  - Circumferential Edema Measurements - multi extremities 0 []  - Nutritional Assessment / Counseling / Intervention 0 []  - Lower Extremity Assessment (monofilament, tuning fork, pulses) 0 []  - Peripheral Arterial Disease Assessment (using hand held doppler) 0 ASSESSMENTS - Ostomy and/or Continence Assessment and Care []  - Incontinence Assessment and Management 0 []  - Ostomy Care Assessment and Management (repouching, etc.) 0 PROCESS - Coordination of Care X - Simple Patient / Family Education for ongoing care 1 15 []  - Complex (extensive) Patient / Family Education for ongoing care 0 X - Staff obtains Programmer, systems, Records, Test Results / Process Orders 1 10 []  - Staff telephones HHA, Nursing Homes / Clarify orders / etc 0 []  - Routine Transfer to another Facility (non-emergent condition) 0 Borger, [name] ([id number]) []  - Routine Hospital Admission (non-emergent condition) 0 []  - New Admissions / Biomedical engineer / Ordering NPWT, Apligraf, etc. 0 []  - Emergency Hospital Admission (emergent condition) 0 X - Simple Discharge Coordination 1 10 []  - Complex (extensive) Discharge Coordination 0 PROCESS - Special Needs []  - Pediatric / Minor Patient Management 0 []  - Isolation Patient Management 0 []  - Hearing / Language / Visual special needs 0 []  - Assessment of Community assistance (transportation, D/C planning, etc.) 0 []  - Additional assistance / Altered mentation 0 []  - Support Surface(s) Assessment (bed, cushion, seat, etc.) 0 INTERVENTIONS - Wound Cleansing / Measurement X - Simple Wound Cleansing - one wound 1 5 []  - Complex Wound Cleansing - multiple wounds 0 X - Wound Imaging (photographs - any number of wounds) 1 5 []  - Wound Tracing (instead of photographs) 0 X - Simple Wound Measurement - one wound 1 5 []  - Complex Wound Measurement - multiple wounds 0 INTERVENTIONS - Wound  Dressings X - Small Wound Dressing one or multiple wounds 1 10 []  - Medium  Wound Dressing one or multiple wounds 0 []  - Large Wound Dressing one or multiple wounds 0 []  - Application of Medications - topical 0 []  - Application of Medications - injection 0 INTERVENTIONS - Miscellaneous []  - External ear exam 0 Ranieri, Sheanna S. ([id number]) []  - Specimen Collection (cultures, biopsies, blood, body fluids, etc.) 0 []  - Specimen(s) / Culture(s) sent or taken to Lab for analysis 0 []  - Patient Transfer (multiple staff / Harrel Lemon Lift / Similar devices) 0 []  - Simple Staple / Suture removal (25 or less) 0 []  - Complex Staple / Suture removal (26 or more) 0 []  - Hypo / Hyperglycemic Management (close monitor of Blood Glucose) 0 []  - Ankle / Brachial Index (ABI) - do not check if billed separately 0 X - Vital Signs 1 5 Has the patient been seen at the hospital within the last three years: Yes Total Score: 75 Level Of Care: New/Established - Level 2 Electronic Signature(s) Signed: 01/01/2016 5:27:00 PM By: Gretta Cool, RN, [name], Kim RN, [name] Entered By: Gretta Cool, RN, [name], Kim on 01/01/2016 10:04:41 Banos, [name] ([id number]) -------------------------------------------------------------------------------- Encounter Discharge Information Details [name]. Date of Service: 01/01/2016 9:15 AM Medical Record Number: [id number] Patient Account Number: [id number] Date of Birth/Sex: [birth year] (60 y.o. Female) Treating RN: Cornell Barman Primary Care Physician: [name] Other Clinician: Referring Physician: [name] Treating Physician/Extender: Frann Rider in Treatment: 34 Encounter Discharge Information Items Discharge Pain Level: 0 Discharge Condition: Stable Ambulatory Status: Walker Discharge Destination: Home Transportation: Other Accompanied By: self Schedule Follow-up Appointment: Yes Medication Reconciliation completed Yes and provided to Patient/Care  Garreth Burnsworth: Provided on Clinical Summary of Care: 01/01/2016 Form Type Recipient Paper Patient LD Electronic Signature(s) Signed: 01/01/2016 10:08:55 AM By: Ruthine Dose Entered By: Ruthine Dose on 01/01/2016 10:08:55 Yurko, [name] ([id number]) -------------------------------------------------------------------------------- Multi Wound Chart Details Patient Name: [name] Date of Service: 01/01/2016 9:15 AM Medical Record Number: [id number] Patient Account Number: [id number] Date of Birth/Sex: 12-23-1955 (60 y.o. Female) Treating RN: Cornell Barman Primary Care Physician: [name] Other Clinician: Referring Physician: [name] Treating Physician/Extender: Frann Rider in Treatment: 70 Vital Signs Height(in): 65 Pulse(bpm): 82 Weight(lbs): 156 Blood Pressure 161/70 (mmHg): Body Mass Index(BMI): 26 Temperature(F): 98.4 Respiratory Rate (breaths/min): Photos: [N/A:N/A] Wound Location: Right Back N/A N/A Wounding Event: Blister N/A N/A Primary Etiology: Pressure Ulcer N/A N/A Comorbid History: Glaucoma, Anemia, N/A N/A Coronary Artery Disease, Hypertension, Type II Diabetes, End Stage Renal Disease, Osteoarthritis, Neuropathy Date Acquired: 10/19/2013 N/A N/A Weeks of Treatment: 53 N/A N/A Wound Status: Open N/A N/A Measurements L x W x D 2.8x2.5x0.1 N/A N/A (cm) Area (cm) : 5.498 N/A N/A Volume (cm) : 0.55 N/A N/A % Reduction in Area: 85.70% N/A N/A % Reduction in Volume: 85.70% N/A N/A Classification: Category/Stage II N/A N/A Exudate Amount: Medium N/A N/A Exudate Type: Serosanguineous N/A N/A Exudate Color: red, brown N/A N/A Wound Margin: Distinct, outline attached N/A N/A Degrave, Keiara S. ([id number]) Granulation Amount: Large (67-100%) N/A N/A Granulation Quality: Red, Friable N/A N/A Necrotic Amount: None Present (0%) N/A N/A Exposed Structures: Fascia: No N/A N/A Fat: No Tendon: No Muscle: No Joint: No Bone: No Limited to  Skin Breakdown Epithelialization: None N/A N/A Periwound Skin Texture: Friable: Yes N/A N/A Scarring: Yes Edema: No Excoriation: No Induration: No Callus: No Crepitus: No Fluctuance: No Rash: No Periwound Skin Moist: Yes N/A N/A Moisture: Maceration: No Dry/Scaly: No Periwound Skin Color: Atrophie Blanche: No N/A N/A Cyanosis: No Ecchymosis: No Erythema: No  Hemosiderin Staining: No Mottled: No Pallor: No Rubor: No Temperature: No Abnormality N/A N/A Tenderness on No N/A N/A Palpation: Wound Preparation: Ulcer Cleansing: N/A N/A Rinsed/Irrigated with Saline Topical Anesthetic Applied: None Treatment Notes Electronic Signature(s) Signed: 01/01/2016 5:27:00 PM By: Gretta Cool, RN, [name], Kim RN, [name] Entered By: Gretta Cool, RN, [name], Kim on 01/01/2016 10:03:31 Salberg, [name] ([id number]) -------------------------------------------------------------------------------- [place] Details Patient Name: LULUBELLE, DILLAVOU. Date of Service: 01/01/2016 9:15 AM Medical Record Number: [id number] Patient Account Number: [id number] Date of Birth/Sex: Jul 21, 1955 (60 y.o. Female) Treating RN: Cornell Barman Primary Care Physician: [name] Other Clinician: Referring Physician: [name] Treating Physician/Extender: Frann Rider in Treatment: 19 Active Inactive Abuse / Safety / Falls / Self Care Management Nursing Diagnoses: Potential for falls Goals: Patient will remain injury free Date Initiated: 12/23/2014 Goal Status: Active Patient/caregiver will verbalize understanding of skin care regimen Date Initiated: 12/23/2014 Goal Status: Active Patient/caregiver will verbalize/demonstrate measures taken to prevent injury and/or falls Date Initiated: 12/23/2014 Goal Status: Active Patient/caregiver will verbalize/demonstrate understanding of what to do in case of emergency Date Initiated: 12/23/2014 Goal Status: Active Interventions: Assess fall risk on admission and as  needed Assess: immobility, friction, shearing, incontinence upon admission and as needed Assess impairment of mobility on admission and as needed per policy Assess self care needs on admission and as needed Provide education on fall prevention Notes: Wound/Skin Impairment Nursing Diagnoses: Impaired tissue integrity Knowledge deficit related to ulceration/compromised skin integrity Goals: Patient/caregiver will verbalize understanding of skin care regimen MAIDEN, COFFEY ([id number]) Date Initiated: 12/23/2014 Goal Status: Active Ulcer/skin breakdown will heal within 14 weeks Date Initiated: 12/23/2014 Goal Status: Active Interventions: Assess patient/caregiver ability to obtain necessary supplies Assess patient/caregiver ability to perform ulcer/skin care regimen upon admission and as needed Assess ulceration(s) every visit Provide education on ulcer and skin care Treatment Activities: Skin care regimen initiated : 11/05/2015 Topical wound management initiated : 11/05/2015 Notes: Electronic Signature(s) Signed: 01/01/2016 5:27:00 PM By: Gretta Cool, RN, [name], Kim RN, [name] Entered By: Gretta Cool, RN, [name], Kim on 01/01/2016 10:03:22 Hinchliffe, [name] ([id number]) -------------------------------------------------------------------------------- Pain Assessment Details Patient Name: [name] Date of Service: 01/01/2016 9:15 AM Medical Record Number: [id number] Patient Account Number: [id number] Date of Birth/Sex: October 18, 1955 (60 y.o. Female) Treating RN: Cornell Barman Primary Care Physician: [name] Other Clinician: Referring Physician: [name] Treating Physician/Extender: Frann Rider in Treatment: 14 Active Problems Location of Pain Severity and Description of Pain Patient Has Paino No Site Locations With Dressing Change: No Pain Management and Medication Current Pain Management: Electronic Signature(s) Signed: 01/01/2016 2:08:36 PM By: Jeri Cos PA-C Signed: 01/01/2016  5:27:00 PM By: Gretta Cool, RN, [name], Kim RN, [name] Entered By: Jeri Cos on 01/01/2016 09:55:48 Durbin, [name] ([id number]) -------------------------------------------------------------------------------- Patient/Caregiver Education Details Patient Name: [name] Date of Service: 01/01/2016 9:15 AM Medical Record Number: [id number] Patient Account Number: [id number] Date of Birth/Gender: 05-03-1956 (60 y.o. Female) Treating RN: Cornell Barman Primary Care Physician: [name] Other Clinician: Referring Physician: [name] Treating Physician/Extender: Frann Rider in Treatment: 27 Education Assessment Education Provided To: Patient Education Topics Provided Wound/Skin Impairment: Handouts: Caring for Your Ulcer, Other: sent extra dressing with patient Methods: Demonstration Responses: State content correctly Electronic Signature(s) Signed: 01/01/2016 5:27:00 PM By: Gretta Cool, RN, [name], Kim RN, [name] Entered By: Gretta Cool, RN, [name], Kim on 01/01/2016 10:07:09 Urbieta, [name] ([id number]) -------------------------------------------------------------------------------- Wound Assessment Details Patient Name: CRUZITA, OVERWEG. Date of Service: 01/01/2016 9:15 AM Medical Record Number: [id number] Patient Account Number: [id number] Date  of Birth/Sex: 1956/03/08 (60 y.o. Female) Treating RN: Cornell Barman Primary Care Physician: [name] Other Clinician: Referring Physician: [name] Treating Physician/Extender: Frann Rider in Treatment: 53 Wound Status Wound Number: 1 Primary Pressure Ulcer Etiology: Wound Location: Right Back Wound Open Wounding Event: Blister Status: Date Acquired: 10/19/2013 Comorbid Glaucoma, Anemia, Coronary Artery Weeks Of Treatment: 53 History: Disease, Hypertension, Type II Clustered Wound: No Diabetes, End Stage Renal Disease, Osteoarthritis, Neuropathy Photos Wound Measurements Length: (cm) 2.8 Width: (cm) 2.5 Depth: (cm) 0.1 Area:  (cm) 5.498 Volume: (cm) 0.55 % Reduction in Area: 85.7% % Reduction in Volume: 85.7% Epithelialization: None Tunneling: No Undermining: No Wound Description Classification: Category/Stage II Wound Margin: Distinct, outline attached Exudate Amount: Medium Exudate Type: Serosanguineous Exudate Color: red, brown Foul Odor After Cleansing: No Wound Bed Granulation Amount: Large (67-100%) Exposed Structure Granulation Quality: Red, Friable Fascia Exposed: No Necrotic Amount: None Present (0%) Fat Layer Exposed: No Tendon Exposed: No Nou, Aldean S. ([id number]) Muscle Exposed: No Joint Exposed: No Bone Exposed: No Limited to Skin Breakdown Periwound Skin Texture Texture Color No Abnormalities Noted: No No Abnormalities Noted: No Callus: No Atrophie Blanche: No Crepitus: No Cyanosis: No Excoriation: No Ecchymosis: No Fluctuance: No Erythema: No Friable: Yes Hemosiderin Staining: No Induration: No Mottled: No Localized Edema: No Pallor: No Rash: No Rubor: No Scarring: Yes Temperature / Pain Moisture Temperature: No Abnormality No Abnormalities Noted: No Dry / Scaly: No Maceration: No Moist: Yes Wound Preparation Ulcer Cleansing: Rinsed/Irrigated with Saline Topical Anesthetic Applied: None Treatment Notes Wound #1 (Right Back) 1. Cleansed with: Clean wound with Normal Saline 2. Anesthetic Topical Lidocaine 4% cream to wound bed prior to debridement 4. Dressing Applied: Other dressing (specify in notes) Notes [employer]ed Barrister's clerk Signature(s) Signed: 01/01/2016 2:08:36 PM By: Jeri Cos PA-C Signed: 01/01/2016 5:27:00 PM By: Gretta Cool RN, [name], Kim RN, [name] Entered By: Jeri Cos on 01/01/2016 09:55:11 Corker, [name] ([id number]) -------------------------------------------------------------------------------- [place] Details Patient Name: [name] Date of Service: 01/01/2016 9:15 AM Medical Record Number: [id number] Patient Account  Number: [id number] Date of Birth/Sex: 05-03-1956 (60 y.o. Female) Treating RN: Cornell Barman Primary Care Physician: [name] Other Clinician: Referring Physician: [name] Treating Physician/Extender: Frann Rider in Treatment: 37 Vital Signs Time Taken: 09:45 Temperature (F): 98.4 Height (in): 65 Pulse (bpm): 82 Weight (lbs): 156 Blood Pressure (mmHg): 161/70 Body Mass Index (BMI): 26 Reference Range: 80 - 120 mg / dl Electronic Signature(s) Signed: 01/01/2016 2:08:36 PM By: Jeri Cos PA-C Entered By: Jeri Cos on 01/01/2016 09:47:22

## 2016-01-08 ENCOUNTER — Encounter: Payer: Medicare Other | Admitting: Surgery

## 2016-01-08 DIAGNOSIS — E11622 Type 2 diabetes mellitus with other skin ulcer: Secondary | ICD-10-CM | POA: Diagnosis not present

## 2016-01-08 NOTE — Progress Notes (Signed)
[name], [name] ([id number]) Visit Report for 01/08/2016 Arrival Information Details [name], [name]. Date of Service: 01/08/2016 9:15 AM Medical Record Number: [id number] Patient Account Number: [id number] Date of Birth/Sex: [birth year] (60 y.o. Female) Treating RN: Macarthur Critchley Primary Care Physician: [name] Other Clinician: Referring Physician: [name] Treating Physician/Extender: Frann Rider in Treatment: 8 Visit Information History Since Last Visit All ordered tests and consults were completed: No Patient Arrived: [name] Added or deleted any medications: No Arrival Time: 09:43 Any new allergies or adverse reactions: No Accompanied By: self Had a fall or experienced change in No Transfer Assistance: None activities of daily living that may affect Patient Identification Verified: Yes risk of falls: Secondary Verification Process Yes Signs or symptoms of abuse/neglect since last No Completed: visito Patient Requires Transmission- No Hospitalized since last visit: No Based Precautions: Has Dressing in Place as Prescribed: Yes Patient Has Alerts: Yes Pain Present Now: No Patient Alerts: Patient on Blood Thinner Electronic Signature(s) Signed: 01/08/2016 4:06:39 PM By: Rebecca Eaton RN, Sendra Entered By: Rebecca Eaton RN, Sendra on 01/08/2016 09:43:55 [name], [name] ([id number]) -------------------------------------------------------------------------------- Clinic Level of Care Assessment Details [name]. Date of Service: 01/08/2016 9:15 AM Medical Record Number: [id number] Patient Account Number: [id number] Date of Birth/Sex: [birth year] (60 y.o. Female) Treating RN: Macarthur Critchley Primary Care Physician: [name] Other Clinician: Referring Physician: [name] Treating Physician/Extender: Frann Rider in Treatment: 26 Clinic Level of Care Assessment Items TOOL 4 Quantity Score X - Use when only an  EandM is performed on FOLLOW-UP visit 1 0 ASSESSMENTS - Nursing Assessment / Reassessment X - Reassessment of Co-morbidities (includes updates in patient status) 1 10 X - Reassessment of Adherence to Treatment Plan 1 5 ASSESSMENTS - Wound and Skin Assessment / Reassessment X - Simple Wound Assessment / Reassessment - one wound 1 5 []  - Complex Wound Assessment / Reassessment - multiple wounds 0 []  - Dermatologic / Skin Assessment (not related to wound area) 0 ASSESSMENTS - Focused Assessment []  - Circumferential Edema Measurements - multi extremities 0 []  - Nutritional Assessment / Counseling / Intervention 0 []  - Lower Extremity Assessment (monofilament, tuning fork, pulses) 0 []  - Peripheral Arterial Disease Assessment (using hand held doppler) 0 ASSESSMENTS - Ostomy and/or Continence Assessment and Care []  - Incontinence Assessment and Management 0 []  - Ostomy Care Assessment and Management (repouching, etc.) 0 PROCESS - Coordination of Care X - Simple Patient / Family Education for ongoing care 1 15 []  - Complex (extensive) Patient / Family Education for ongoing care 0 X - Staff obtains Programmer, systems, Records, Test Results / Process Orders 1 10 X - Staff telephones HHA, Nursing Homes / Clarify orders / etc 1 10 []  - Routine Transfer to another Facility (non-emergent condition) 0 [name], [name] ([id number]) []  - Routine Hospital Admission (non-emergent condition) 0 []  - New Admissions / Biomedical engineer / Ordering NPWT, Apligraf, etc. 0 []  - Emergency Hospital Admission (emergent condition) 0 X - Simple Discharge Coordination 1 10 []  - Complex (extensive) Discharge Coordination 0 PROCESS - Special Needs []  - Pediatric / Minor Patient Management 0 []  - Isolation Patient Management 0 []  - Hearing / Language / Visual special needs 0 []  - Assessment of Community assistance (transportation, D/C planning, etc.) 0 []  - Additional assistance / Altered mentation 0 []  - Support  Surface(s) Assessment (bed, cushion, seat, etc.) 0 INTERVENTIONS - Wound Cleansing / Measurement X - Simple Wound Cleansing - one wound 1 5 []  - Complex Wound  Cleansing - multiple wounds 0 X - Wound Imaging (photographs - any number of wounds) 1 5 []  - Wound Tracing (instead of photographs) 0 X - Simple Wound Measurement - one wound 1 5 []  - Complex Wound Measurement - multiple wounds 0 INTERVENTIONS - Wound Dressings X - Small Wound Dressing one or multiple wounds 1 10 []  - Medium Wound Dressing one or multiple wounds 0 []  - Large Wound Dressing one or multiple wounds 0 []  - Application of Medications - topical 0 []  - Application of Medications - injection 0 INTERVENTIONS - Miscellaneous []  - External ear exam 0 [name], [name] ([id number]) []  - Specimen Collection (cultures, biopsies, blood, body fluids, etc.) 0 []  - Specimen(s) / Culture(s) sent or taken to Lab for analysis 0 []  - Patient Transfer (multiple staff / Harrel Lemon Lift / Similar devices) 0 []  - Simple Staple / Suture removal (25 or less) 0 []  - Complex Staple / Suture removal (26 or more) 0 []  - Hypo / Hyperglycemic Management (close monitor of Blood Glucose) 0 []  - Ankle / Brachial Index (ABI) - do not check if billed separately 0 X - Vital Signs 1 5 Has the patient been seen at the hospital within the last three years: Yes Total Score: 95 Level Of Care: New/Established - Level 3 Electronic Signature(s) Signed: 01/08/2016 4:06:39 PM By: Rebecca Eaton, RN, Sendra Entered By: Rebecca Eaton RN, Sendra on 01/08/2016 11:48:13 [name], [name] ([id number]) -------------------------------------------------------------------------------- Encounter Discharge Information Details [name]. Date of Service: 01/08/2016 9:15 AM Medical Record Number: [id number] Patient Account Number: [id number] Date of Birth/Sex: 04-16-1956 (60 y.o. Female) Treating RN: Macarthur Critchley Primary Care Physician: [name] Other  Clinician: Referring Physician: [name] Treating Physician/Extender: Frann Rider in Treatment: 46 Encounter Discharge Information Items Facility Notification Discharge Pain Level: 0 Facility Type: [facility] Discharge Condition: Stable Orders Sent: Yes Ambulatory Status: [facility] Discharge Destination: Nursing Home Transportation: Other Schedule Follow-up Appointment: Yes Medication Reconciliation completed and provided to Patient/Care Yes Tiffanyann Deroo: Provided on Clinical Summary of Care: 01/08/2016 Form Type Recipient Paper Patient LD Electronic Signature(s) Signed: 01/08/2016 4:06:39 PM By: Rebecca Eaton RN, Sendra Previous Signature: 01/08/2016 9:57:44 AM Version By: Ruthine Dose Entered By: Rebecca Eaton RN, Sendra on 01/08/2016 10:29:51 Abraha, [name] ([id number]) -------------------------------------------------------------------------------- Lower Extremity Assessment Details [name], [name] Date of Service: 01/08/2016 9:15 AM Medical Record Number: [id number] Patient Account Number: [id number] Date of Birth/Sex: 1956/07/11 (60 y.o. Female) Treating RN: Macarthur Critchley Primary Care Physician: [name] Other Clinician: Referring Physician: [name] Treating Physician/Extender: Frann Rider in Treatment: 9 Electronic Signature(s) Signed: 01/08/2016 4:06:39 PM By: Rebecca Eaton RN, Sendra Entered By: Rebecca Eaton RN, Sendra on 01/08/2016 09:44:10 Rail, [name] ([id number]) -------------------------------------------------------------------------------- Pain Assessment Details [name]. Date of Service: 01/08/2016 9:15 AM Medical Record Number: [id number] Patient Account Number: [id number] Date of Birth/Sex: [birth year] (60 y.o. Female) Treating RN: Macarthur Critchley Primary Care Physician: [name] Other Clinician: Referring Physician: [name] Treating Physician/Extender: Frann Rider in  Treatment: 30 Active Problems Location of Pain Severity and Description of Pain Patient Has Paino No Site Locations Pain Management and Medication Current Pain Management: Electronic Signature(s) Signed: 01/08/2016 4:06:39 PM By: Rebecca Eaton RN, Sendra Entered By: Rebecca Eaton RN, Sendra on 01/08/2016 09:44:01 Hamlett, [name] ([id number]) -------------------------------------------------------------------------------- Patient/Caregiver Education Details [name]. Date of Service: 01/08/2016 9:15 AM Medical Record Number: [id number] Patient Account Number: [id number] Date of Birth/Gender: 1955-11-19 (60 y.o. Female) Treating RN: Macarthur Critchley Primary Care  Physician: [name] Other Clinician: Referring Physician: [name] Treating Physician/Extender: Frann Rider in Treatment: 57 Education Assessment Education Provided To: Patient Education Topics Provided Wound/Skin Impairment: Handouts: Caring for Your Ulcer, Skin Care Do's and Dont's Methods: Explain/Verbal Responses: State content correctly Electronic Signature(s) Signed: 01/08/2016 4:06:39 PM By: Rebecca Eaton RN, Sendra Entered By: Rebecca Eaton RN, Sendra on 01/08/2016 10:30:03 Wajda, [name] ([id number]) -------------------------------------------------------------------------------- Wound Assessment Details [name]. Date of Service: 01/08/2016 9:15 AM Medical Record Number: [id number] Patient Account Number: [id number] Date of Birth/Sex: [birth year] (60 y.o. Female) Treating RN: Macarthur Critchley Primary Care Physician: [name] Other Clinician: Referring Physician: [name] Treating Physician/Extender: Frann Rider in Treatment: 73 Wound Status Wound Number: 1 Primary Pressure Ulcer Etiology: Wound Location: Right Back Wound Open Wounding Event: Blister Status: Date Acquired: 10/19/2013 Comorbid Glaucoma, Anemia, Coronary Artery Weeks Of Treatment:  54 History: Disease, Hypertension, Type II Clustered Wound: No Diabetes, End Stage Renal Disease, Osteoarthritis, Neuropathy Photos Wound Measurements Length: (cm) 4.5 Width: (cm) 4.5 Depth: (cm) 0.1 Area: (cm) 15.904 Volume: (cm) 1.59 % Reduction in Area: 58.7% % Reduction in Volume: 58.7% Epithelialization: None Tunneling: No Undermining: No Wound Description Classification: Category/Stage II Wound Margin: Distinct, outline attached Exudate Amount: Medium Exudate Type: Serosanguineous Exudate Color: red, brown Foul Odor After Cleansing: No Wound Bed Granulation Amount: Large (67-100%) Exposed Structure Granulation Quality: Red, Friable Fascia Exposed: No Necrotic Amount: None Present (0%) Fat Layer Exposed: No Tendon Exposed: No Ocon, Anberlin S. ([id number]) Muscle Exposed: No Joint Exposed: No Bone Exposed: No Limited to Skin Breakdown Periwound Skin Texture Texture Color No Abnormalities Noted: No No Abnormalities Noted: No Callus: No Atrophie Blanche: No Crepitus: No Cyanosis: No Excoriation: No Ecchymosis: No Fluctuance: No Erythema: No Friable: Yes Hemosiderin Staining: No Induration: No Mottled: No Localized Edema: No Pallor: No Rash: No Rubor: No Scarring: Yes Temperature / Pain Moisture Temperature: No Abnormality No Abnormalities Noted: No Dry / Scaly: No Maceration: No Moist: Yes Wound Preparation Ulcer Cleansing: Rinsed/Irrigated with Saline Topical Anesthetic Applied: None Treatment Notes Wound #1 (Right Back) 1. Cleansed with: Clean wound with Normal Saline 4. 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 Date of Service: 01/08/2016 9:15 AM Medical Record Number: [id number] Patient Account Number: [id number] Date of Birth/Sex: 06/26/1956 (60 y.o. Female) Treating RN: Macarthur Critchley Primary Care Physician: [name] Other Clinician: Referring Physician: [name] Treating Physician/Extender: Frann Rider in Treatment: 63 Vital Signs Time Taken: 09:45 Temperature (F): 98.1 Height (in): 65 Pulse (bpm): 86 Weight (lbs): 156 Blood Pressure (mmHg): 158/67 Body Mass Index (BMI): 26 Reference Range: 80 - 120 mg / dl Electronic Signature(s) Signed: 01/08/2016 4:06:39 PM By: Rebecca Eaton RN, Sendra Entered By: Rebecca Eaton RN, Sendra on 01/08/2016 09:45:55

## 2016-01-08 NOTE — Progress Notes (Addendum)
CHARLYNNE, TUBBS ([id number]) Visit Report for 01/08/2016 Chief Complaint Document Details [name], [name]. Date of Service: 01/08/2016 9:15 AM Medical Record Number: [id number] Patient Account Number: [id number] Date of Birth/Sex: Dec 03, 1955 (60 y.o. Female) Treating RN: Macarthur Critchley Primary Care Physician: [name] Other Clinician: Referring Physician: [name] Treating Physician/Extender: Frann Rider in Treatment: 42 Information Obtained from: Patient Chief Complaint Patient presents to the wound care center for a consult due non healing wound. She has an open wound on her right upper back which she's had for about a year and she recently noticed a blister on her right lower extremity about 2 weeks ago. Electronic Signature(s) Signed: 01/08/2016 9:45:16 AM By: [name], [name] Entered By: [name] on 01/08/2016 09:45:15 Ciampi, [name] ([id number]) -------------------------------------------------------------------------------- HPI Details Patient Name: [name], [name]. Date of Service: 01/08/2016 9:15 AM Medical Record Number: [id number] Patient Account Number: [id number] Date of Birth/Sex: 03-27-1956 (60 y.o. Female) Treating RN: Macarthur Critchley Primary Care Physician: [name] Other Clinician: Referring Physician: [name] Treating Physician/Extender: Frann Rider in Treatment: 61 History of Present Illness Location: right upper back and right lower extremity wounds Quality: Patient reports No Pain. Severity: Patient states wound (s) are getting better. Duration: Patient has had the wound for > 3 months prior to seeking treatment at the wound center Timing: she thought it first occurred when she was using a heating pad about a year ago after back surgery. Context: The wound appeared gradually over time Modifying Factors: Patient is currently on renal dialysis and receives treatments 3 times weekly Associated Signs  and Symptoms: Patient reports having: surgery scheduled for this week for a AV fistula left arm. HPI Description: 60 year old patient who is known to be a diabetic and has end-stage renal disease has had several comorbidities including coronary artery disease, hypertension, hyperlipidemia, pancreatitis, anemia, previous history of hysterectomy, cholecystectomy, left-sided salivary gland excision, bilateral cataract surgery,Peritoneal dialysis catheter, hemodialysis catheter. the area on the back has also been caused by instant pressure she used to sleep on a recliner all day and has significant kyphoscoliosis. As far as the wound on her right lower extremity she's not sure how this blister occurred but she thought it has been there for about 2 weeks. No recent blood investigations available and no recent hemoglobin A1c. 12/30/2014 -- she is an assisted living facility but I believe the nurses that have not followed instructions as she had some cream applied on her back and there was a different dressing. Last week she's had a AV fistula placed on her left forearm. 01/13/2015 -- she has had some localized infection at the port site and she's been on doxycycline for this. 02/05/2015 - he has developed a small blister on her right lower extremity. 02/10/2015 -- she has developed another small blister on her right anterior chest wall in the area where she's had tape for her dialysis access. this may just be injury caused by a tape burn. 02/18/2015 -- no new blisters and she had a dermatology opinion and they have taken a biopsy of her skin. She also had a left brachial AV fistula placed this week. 03/03/2015 -- though we do not have the pathology report yet the patient says she has been put on prednisone because this skin disease is possibly to do with her immune system and her dermatologist is recommended this. 03/10/2015 -- she has developed a new blister which is quite large on her right lower  extremity on the shin.  05/22/2015 -- she did very well with her dressing but on trying to remove the [place] it peels away the newly healed skin. ZAKIA, POPHAM ([id number]) 06/05/2015 -- the patient was in the ER this past week for severe bleeding from the right nostril and had to have ENT see her and do a packing. They've also been holding her heparin during her dialysis. The patient is going back to ENT today. Other than that she has had no other significant issues. 06/19/2015 -- she is back on her blood thinners and continues to have her hemodialysis as before. 07/31/2015 -- a large blister has popped up again on her right lower extremity in the same place in the mid shin area in the anterior lateral compartment. 10/05/2015 -- last week she was admitted to the hospital for 4 days as she had a pneumonia and was treated with IV and oral antibiotics. Electronic Signature(s) Signed: 01/08/2016 9:45:19 AM By: [name], [name] Entered By: [name] on 01/08/2016 09:45:19 Kassel, [name] ([id number]) -------------------------------------------------------------------------------- Physical Exam Details [name], 43. Date of Service: 01/08/2016 9:15 AM Medical Record Number: [id number] Patient Account Number: [id number] Date of Birth/Sex: 26-Mar-1956 (60 y.o. Female) Treating RN: Macarthur Critchley Primary Care Physician: [name] Other Clinician: Referring Physician: [name] Treating Physician/Extender: Frann Rider in Treatment: 29 Constitutional . Pulse regular. Respirations normal and unlabored. Afebrile. . Eyes Nonicteric. Reactive to light. Ears, Nose, Mouth, and Throat Lips, teeth, and gums WNL.Marland Kitchen Moist mucosa without lesions. Neck supple and nontender. No palpable supraclavicular or cervical adenopathy. Normal sized without goiter. Respiratory WNL. No retractions.. Cardiovascular Pedal Pulses WNL. No clubbing, cyanosis or  edema. Lymphatic No adneopathy. No adenopathy. No adenopathy. Musculoskeletal Adexa without tenderness or enlargement.. Digits and nails w/o clubbing, cyanosis, infection, petechiae, ischemia, or inflammatory conditions.. Integumentary (Hair, Skin) No suspicious lesions. No crepitus or fluctuance. No peri-wound warmth or erythema. No masses.Marland Kitchen Psychiatric Judgement and insight Intact.. No evidence of depression, anxiety, or agitation.. Notes the wound on the back has some superficial ulceration and bleeds briskly and touch but other than that is looking healthy. No debridement was required today. Electronic Signature(s) Signed: 01/08/2016 10:05:51 AM By: [name], [name] Previous Signature: 01/08/2016 9:46:06 AM Version By: [name], [name] Entered By: [name] on 01/08/2016 10:05:51 [place], [name] ([id number]) -------------------------------------------------------------------------------- Physician Orders Details Patient Name: [name] Date of Service: 01/08/2016 9:15 AM Medical Record Number: [id number] Patient Account Number: [id number] Date of Birth/Sex: December 24, 1955 (60 y.o. Female) Treating RN: Macarthur Critchley Primary Care Physician: [name] Other Clinician: Referring Physician: [name] Treating Physician/Extender: Frann Rider in Treatment: 10 Verbal / Phone Orders: Yes Clinician: Macarthur Critchley Read Back and Verified: Yes Diagnosis Coding ICD-10 Coding Code Description E11.622 Type 2 diabetes mellitus with other skin ulcer L89.112 Pressure ulcer of right upper back, stage 2 S80.821A Blister (nonthermal), right lower leg, initial encounter N18.6 End stage renal disease Wound Cleansing Wound #1 Right Back o Clean wound with Normal Saline. Skin Barriers/Peri-Wound Care Wound #1 Right Back o Skin Prep Primary Wound Dressing Wound #1 Right Back o Cutimed Siltec Sorbact Dressing Change Frequency Wound #1 Right Back o  Other: - Change dressing if needed due to drainage. Follow-up Appointments Wound #1 Right Back o Return Appointment in 1 week. Electronic Signature(s) Signed: 01/08/2016 4:06:39 PM By: Ardean Larsen Signed: 01/08/2016 4:34:27 PM By: [name], [name] Entered By: Rebecca Eaton RN, [place] on 01/08/2016 09:52:06 Gugliotta, [name] ([id number]) -------------------------------------------------------------------------------- Problem  List Details Patient Name: ALLYANNA, FLOREK. Date of Service: 01/08/2016 9:15 AM Medical Record Number: [id number] Patient Account Number: [id number] Date of Birth/Sex: 1955-11-22 (60 y.o. Female) Treating RN: Macarthur Critchley Primary Care Physician: [name] Other Clinician: Referring Physician: [name] Treating Physician/Extender: Frann Rider in Treatment: 50 Active Problems ICD-10 Encounter Code Description Active Date Diagnosis E11.622 Type 2 diabetes mellitus with other skin ulcer 12/23/2014 Yes L89.112 Pressure ulcer of right upper back, stage 2 12/23/2014 Yes S80.821A Blister (nonthermal), right lower leg, initial encounter 12/23/2014 Yes N18.6 End stage renal disease 12/23/2014 Yes Inactive Problems Resolved Problems Electronic Signature(s) Signed: 01/08/2016 9:45:08 AM By: [name], [name] Entered By: [name] on 01/08/2016 09:45:08 Norgren, [name] ([id number]) -------------------------------------------------------------------------------- Progress Note Details Patient Name: Burbridge, Marlicia S. Date of Service: 01/08/2016 9:15 AM Medical Record Number: [id number] Patient Account Number: [id number] Date of Birth/Sex: 01-17-1956 (60 y.o. Female) Treating RN: Macarthur Critchley Primary Care Physician: [name] Other Clinician: Referring Physician: [name] Treating Physician/Extender: Frann Rider in Treatment: 17 Subjective Chief Complaint Information obtained from Patient Patient presents to the wound care  center for a consult due non healing wound. She has an open wound on her right upper back which she's had for about a year and she recently noticed a blister on her right lower extremity about 2 weeks ago. History of Present Illness (HPI) The following HPI elements were documented for the patient's wound: Location: right upper back and right lower extremity wounds Quality: Patient reports No Pain. Severity: Patient states wound (s) are getting better. Duration: Patient has had the wound for > 3 months prior to seeking treatment at the wound center Timing: she thought it first occurred when she was using a heating pad about a year ago after back surgery. Context: The wound appeared gradually over time Modifying Factors: Patient is currently on renal dialysis and receives treatments 3 times weekly Associated Signs and Symptoms: Patient reports having: surgery scheduled for this week for a AV fistula left arm. 60 year old patient who is known to be a diabetic and has end-stage renal disease has had several comorbidities including coronary artery disease, hypertension, hyperlipidemia, pancreatitis, anemia, previous history of hysterectomy, cholecystectomy, left-sided salivary gland excision, bilateral cataract surgery,Peritoneal dialysis catheter, hemodialysis catheter. the area on the back has also been caused by instant pressure she used to sleep on a recliner all day and has significant kyphoscoliosis. As far as the wound on her right lower extremity she's not sure how this blister occurred but she thought it has been there for about 2 weeks. No recent blood investigations available and no recent hemoglobin A1c. 12/30/2014 -- she is an assisted living facility but I believe the nurses that have not followed instructions as she had some cream applied on her back and there was a different dressing. Last week she's had a AV fistula placed on her left forearm. 01/13/2015 -- she has had some  localized infection at the port site and she's been on doxycycline for this. 02/05/2015 - he has developed a small blister on her right lower extremity. 02/10/2015 -- she has developed another small blister on her right anterior chest wall in the area where she's had tape for her dialysis access. this may just be injury caused by a tape burn. 02/18/2015 -- no new blisters and she had a dermatology opinion and they have taken a biopsy of her skin. Holroyd, [name] ([id number]) She also had a left brachial AV fistula placed this week. 03/03/2015 --  though we do not have the pathology report yet the patient says she has been put on prednisone because this skin disease is possibly to do with her immune system and her dermatologist is recommended this. 03/10/2015 -- she has developed a new blister which is quite large on her right lower extremity on the shin. 05/22/2015 -- she did very well with her dressing but on trying to remove the [place] it peels away the newly healed skin. 06/05/2015 -- the patient was in the ER this past week for severe bleeding from the right nostril and had to have ENT see her and do a packing. They've also been holding her heparin during her dialysis. The patient is going back to ENT today. Other than that she has had no other significant issues. 06/19/2015 -- she is back on her blood thinners and continues to have her hemodialysis as before. 07/31/2015 -- a large blister has popped up again on her right lower extremity in the same place in the mid shin area in the anterior lateral compartment. 10/05/2015 -- last week she was admitted to the hospital for 4 days as she had a pneumonia and was treated with IV and oral antibiotics. Objective Constitutional Pulse regular. Respirations normal and unlabored. Afebrile. Vitals Time Taken: 9:45 AM, Height: 65 in, Weight: 156 lbs, BMI: 26, Temperature: 98.1 F, Pulse: 86 bpm, Blood Pressure: 158/67 mmHg. Eyes Nonicteric.  Reactive to light. Ears, Nose, Mouth, and Throat Lips, teeth, and gums WNL.Marland Kitchen Moist mucosa without lesions. Neck supple and nontender. No palpable supraclavicular or cervical adenopathy. Normal sized without goiter. Respiratory WNL. No retractions.. Cardiovascular Pedal Pulses WNL. No clubbing, cyanosis or edema. Lymphatic No adneopathy. No adenopathy. No adenopathy. [name] ([id number]) Musculoskeletal Adexa without tenderness or enlargement.. Digits and nails w/o clubbing, cyanosis, infection, petechiae, ischemia, or inflammatory conditions.Marland Kitchen Psychiatric Judgement and insight Intact.. No evidence of depression, anxiety, or agitation.. General Notes: the wound on the back has some superficial ulceration and bleeds briskly and touch but other than that is looking healthy. No debridement was required today. Integumentary (Hair, Skin) No suspicious lesions. No crepitus or fluctuance. No peri-wound warmth or erythema. No masses.. Wound #1 status is Open. Original cause of wound was Blister. The wound is located on the Right Back. The wound measures 4.5cm length x 4.5cm width x 0.1cm depth; 15.904cm^2 area and 1.59cm^3 volume. The wound is limited to skin breakdown. There is no tunneling or undermining noted. There is a medium amount of serosanguineous drainage noted. The wound margin is distinct with the outline attached to the wound base. There is large (67-100%) red, friable granulation within the wound bed. There is no necrotic tissue within the wound bed. The periwound skin appearance exhibited: Friable, Scarring, Moist. The periwound skin appearance did not exhibit: Callus, Crepitus, Excoriation, Fluctuance, Induration, Localized Edema, Rash, Dry/Scaly, Maceration, Atrophie Blanche, Cyanosis, Ecchymosis, Hemosiderin Staining, Mottled, Pallor, Rubor, Erythema. Periwound temperature was noted as No Abnormality. Assessment Active Problems ICD-10 E11.622 - Type 2 diabetes  mellitus with other skin ulcer L89.112 - Pressure ulcer of right upper back, stage 2 S80.821A - Blister (nonthermal), right lower leg, initial encounter N18.6 - End stage renal disease Plan Wound Cleansing: Wound #1 Right Back: Clean wound with Normal Saline. Skin Barriers/Peri-Wound Care: Wound #1 Right Back: JAHASIA, NAIMI ([id number]) Skin Prep Primary Wound Dressing: Wound #1 Right Back: Cutimed Siltec Sorbact Dressing Change Frequency: Wound #1 Right Back: Other: - Change dressing if needed due to drainage. Follow-up Appointments: Wound #1  Right Back: Return Appointment in 1 week. We will use Siltec Sorbact foam dressing over this and change it as required. She will come back to see me next week Electronic Signature(s) Signed: 01/08/2016 4:37:12 PM By: [name], [name] Previous Signature: 01/08/2016 4:36:58 PM Version By: [name], [name] Previous Signature: 01/08/2016 10:06:10 AM Version By: [name], [name] Entered By: [name] on 01/08/2016 16:37:12 Kulick, [name] ([id number]) -------------------------------------------------------------------------------- SuperBill Details Patient Name: [name] Date of Service: 01/08/2016 Medical Record Number: [id number] Patient Account Number: [id number] Date of Birth/Sex: December 30, 1955 (60 y.o. Female) Treating RN: Macarthur Critchley Primary Care Physician: [name] Other Clinician: Referring Physician: [name] Treating Physician/Extender: Frann Rider in Treatment: 54 Diagnosis Coding ICD-10 Codes Code Description E11.622 Type 2 diabetes mellitus with other skin ulcer L89.112 Pressure ulcer of right upper back, stage 2 S80.821A Blister (nonthermal), right lower leg, initial encounter N18.6 End stage renal disease Facility Procedures CPT4 Code: [id number] Description: 99213 - WOUND CARE VISIT-LEV 3 EST PT Modifier: Quantity: 1 Physician Procedures CPT4 Code: [id number] Description:  [id number] - WC PHYS LEVEL 3 - EST PT ICD-10 Description Diagnosis E11.622 Type 2 diabetes mellitus with other skin ulcer L89.112 Pressure ulcer of right upper back, stage 2 S80.821A Blister (nonthermal), right lower leg, initial enc N18.6 End  stage renal disease Modifier: ounter Quantity: 1 Electronic Signature(s) Signed: 01/08/2016 4:06:39 PM By: Ardean Larsen Signed: 01/08/2016 4:34:27 PM By: [name], [name] Previous Signature: 01/08/2016 10:06:23 AM Version By: [name], [name] Entered By: Rebecca Eaton RN, [name] Amble on 01/08/2016 11:48:22

## 2016-01-15 ENCOUNTER — Encounter: Payer: Medicare Other | Admitting: Surgery

## 2016-01-15 DIAGNOSIS — E11622 Type 2 diabetes mellitus with other skin ulcer: Secondary | ICD-10-CM | POA: Diagnosis not present

## 2016-01-16 NOTE — Progress Notes (Signed)
GENESYS, SETTERBERG ([id number]) Visit Report for 01/15/2016 Chief Complaint Document Details [name], [name]. Date of Service: 01/15/2016 9:15 AM Medical Record Number: [id number] Patient Account Number: [id number] Date of Birth/Sex: 11-01-1955 (60 y.o. Female) Treating RN: Cornell Barman Primary Care Physician: [name] Other Clinician: Referring Physician: [name] Treating Physician/Extender: Frann Rider in Treatment: 26 Information Obtained from: Patient Chief Complaint Patient presents to the wound care center for a consult due non healing wound. She has an open wound on her right upper back which she's had for about a year and she recently noticed a blister on her right lower extremity about 2 weeks ago. Electronic Signature(s) Signed: 01/15/2016 10:24:33 AM By: [name], [name] Entered By: [name] on 01/15/2016 10:24:33 Steffensen, [name] ([id number]) -------------------------------------------------------------------------------- HPI Details Patient Name: [name], [name]. Date of Service: 01/15/2016 9:15 AM Medical Record Number: [id number] Patient Account Number: [id number] Date of Birth/Sex: 03-12-1956 (60 y.o. Female) Treating RN: Cornell Barman Primary Care Physician: [name] Other Clinician: Referring Physician: [name] Treating Physician/Extender: Frann Rider in Treatment: 75 History of Present Illness Location: right upper back and right lower extremity wounds Quality: Patient reports No Pain. Severity: Patient states wound (s) are getting better. Duration: Patient has had the wound for > 3 months prior to seeking treatment at the wound center Timing: she thought it first occurred when she was using a heating pad about a year ago after back surgery. Context: The wound appeared gradually over time Modifying Factors: Patient is currently on renal dialysis and receives treatments 3 times weekly Associated Signs and Symptoms:  Patient reports having: surgery scheduled for this week for a AV fistula left arm. HPI Description: 60 year old patient who is known to be a diabetic and has end-stage renal disease has had several comorbidities including coronary artery disease, hypertension, hyperlipidemia, pancreatitis, anemia, previous history of hysterectomy, cholecystectomy, left-sided salivary gland excision, bilateral cataract surgery,Peritoneal dialysis catheter, hemodialysis catheter. the area on the back has also been caused by instant pressure she used to sleep on a recliner all day and has significant kyphoscoliosis. As far as the wound on her right lower extremity she's not sure how this blister occurred but she thought it has been there for about 2 weeks. No recent blood investigations available and no recent hemoglobin A1c. 12/30/2014 -- she is an assisted living facility but I believe the nurses that have not followed instructions as she had some cream applied on her back and there was a different dressing. Last week she's had a AV fistula placed on her left forearm. 01/13/2015 -- she has had some localized infection at the port site and she's been on doxycycline for this. 02/05/2015 - he has developed a small blister on her right lower extremity. 02/10/2015 -- she has developed another small blister on her right anterior chest wall in the area where she's had tape for her dialysis access. this may just be injury caused by a tape burn. 02/18/2015 -- no new blisters and she had a dermatology opinion and they have taken a biopsy of her skin. She also had a left brachial AV fistula placed this week. 03/03/2015 -- though we do not have the pathology report yet the patient says she has been put on prednisone because this skin disease is possibly to do with her immune system and her dermatologist is recommended this. 03/10/2015 -- she has developed a new blister which is quite large on her right lower extremity on  the shin.  05/22/2015 -- she did very well with her dressing but on trying to remove the [place] it peels away the newly healed skin. KAHRI, SECHREST ([id number]) 06/05/2015 -- the patient was in the ER this past week for severe bleeding from the right nostril and had to have ENT see her and do a packing. They've also been holding her heparin during her dialysis. The patient is going back to ENT today. Other than that she has had no other significant issues. 06/19/2015 -- she is back on her blood thinners and continues to have her hemodialysis as before. 07/31/2015 -- a large blister has popped up again on her right lower extremity in the same place in the mid shin area in the anterior lateral compartment. 10/05/2015 -- last week she was admitted to the hospital for 4 days as she had a pneumonia and was treated with IV and oral antibiotics. Electronic Signature(s) Signed: 01/15/2016 10:24:37 AM By: [name], [name] Entered By: [name] on 01/15/2016 10:24:37 Fambro, [name] ([id number]) -------------------------------------------------------------------------------- Physical Exam Details [name], 60. Date of Service: 01/15/2016 9:15 AM Medical Record Number: [id number] Patient Account Number: [id number] Date of Birth/Sex: Nov 24, 1955 (60 y.o. Female) Treating RN: Cornell Barman Primary Care Physician: [name] Other Clinician: Referring Physician: [name] Treating Physician/Extender: Frann Rider in Treatment: 55 Constitutional . Pulse regular. Respirations normal and unlabored. Afebrile. . Eyes Nonicteric. Reactive to light. Ears, Nose, Mouth, and Throat Lips, teeth, and gums WNL.Marland Kitchen Moist mucosa without lesions. Neck supple and nontender. No palpable supraclavicular or cervical adenopathy. Normal sized without goiter. Respiratory WNL. No retractions.. Breath sounds WNL, No rubs, rales, rhonchi, or wheeze.. Cardiovascular Heart rhythm and  rate regular, no murmur or gallop.. Pedal Pulses WNL. No clubbing, cyanosis or edema. Lymphatic No adneopathy. No adenopathy. No adenopathy. Musculoskeletal Adexa without tenderness or enlargement.. Digits and nails w/o clubbing, cyanosis, infection, petechiae, ischemia, or inflammatory conditions.. Integumentary (Hair, Skin) No suspicious lesions. No crepitus or fluctuance. No peri-wound warmth or erythema. No masses.Marland Kitchen Psychiatric Judgement and insight Intact.. No evidence of depression, anxiety, or agitation.. Notes R superficial ulceration on her back continues to bleed briskly on touch and surrounding it there is a fair amount of supple scar which is healthy Electronic Signature(s) Signed: 01/15/2016 10:25:10 AM By: [name], [name] Entered By: [name] on 01/15/2016 10:25:10 Kittrell, [name] ([id number]) -------------------------------------------------------------------------------- Physician Orders Details Patient Name: [name] Date of Service: 01/15/2016 9:15 AM Medical Record Number: [id number] Patient Account Number: [id number] Date of Birth/Sex: 10-08-1955 (60 y.o. Female) Treating RN: Cornell Barman Primary Care Physician: [name] Other Clinician: Referring Physician: [name] Treating Physician/Extender: Frann Rider in Treatment: 12 Verbal / Phone Orders: Yes Clinician: Cornell Barman Read Back and Verified: Yes Diagnosis Coding Wound Cleansing Wound #1 Right Back o Clean wound with Normal Saline. Skin Barriers/Peri-Wound Care Wound #1 Right Back o Skin Prep Primary Wound Dressing Wound #1 Right Back o Cutimed Siltec Sorbact Dressing Change Frequency Wound #1 Right Back o Other: - Change dressing if needed due to drainage. Follow-up Appointments Wound #1 Right Back o Return Appointment in 1 week. Electronic Signature(s) Signed: 01/15/2016 4:25:19 PM By: Gretta Cool RN, [name], Kim RN, [name] Signed: 01/15/2016 4:47:37 PM By: [name], [name] Entered By: Gretta Cool RN, [name], Kim on 01/15/2016 09:21:51 Samaan, [name] ([id number]) -------------------------------------------------------------------------------- Problem List Details Patient Name: TOMA, SALZANO. Date of Service: 01/15/2016 9:15 AM Medical Record Number: [id number] Patient Account Number: [id number] Date of Birth/Sex:  [birth year] (60 y.o. Female) Treating RN: Cornell Barman Primary Care Physician: [name] Other Clinician: Referring Physician: [name] Treating Physician/Extender: Frann Rider in Treatment: 60 Active Problems ICD-10 Encounter Code Description Active Date Diagnosis E11.622 Type 2 diabetes mellitus with other skin ulcer 12/23/2014 Yes L89.112 Pressure ulcer of right upper back, stage 2 12/23/2014 Yes S80.821A Blister (nonthermal), right lower leg, initial encounter 12/23/2014 Yes N18.6 End stage renal disease 12/23/2014 Yes Inactive Problems Resolved Problems Electronic Signature(s) Signed: 01/15/2016 10:24:25 AM By: [name], [name] Entered By: [name] on 01/15/2016 10:24:24 Kallstrom, [name] ([id number]) -------------------------------------------------------------------------------- Progress Note Details Patient Name: Bradly, Lianny S. Date of Service: 01/15/2016 9:15 AM Medical Record Number: [id number] Patient Account Number: [id number] Date of Birth/Sex: 1955/10/06 (60 y.o. Female) Treating RN: Cornell Barman Primary Care Physician: [name] Other Clinician: Referring Physician: [name] Treating Physician/Extender: Frann Rider in Treatment: 26 Subjective Chief Complaint Information obtained from Patient Patient presents to the wound care center for a consult due non healing wound. She has an open wound on her right upper back which she's had for about a year and she recently noticed a blister on her right lower extremity about 2 weeks ago. History of Present Illness (HPI) The following HPI  elements were documented for the patient's wound: Location: right upper back and right lower extremity wounds Quality: Patient reports No Pain. Severity: Patient states wound (s) are getting better. Duration: Patient has had the wound for > 3 months prior to seeking treatment at the wound center Timing: she thought it first occurred when she was using a heating pad about a year ago after back surgery. Context: The wound appeared gradually over time Modifying Factors: Patient is currently on renal dialysis and receives treatments 3 times weekly Associated Signs and Symptoms: Patient reports having: surgery scheduled for this week for a AV fistula left arm. 60 year old patient who is known to be a diabetic and has end-stage renal disease has had several comorbidities including coronary artery disease, hypertension, hyperlipidemia, pancreatitis, anemia, previous history of hysterectomy, cholecystectomy, left-sided salivary gland excision, bilateral cataract surgery,Peritoneal dialysis catheter, hemodialysis catheter. the area on the back has also been caused by instant pressure she used to sleep on a recliner all day and has significant kyphoscoliosis. As far as the wound on her right lower extremity she's not sure how this blister occurred but she thought it has been there for about 2 weeks. No recent blood investigations available and no recent hemoglobin A1c. 12/30/2014 -- she is an assisted living facility but I believe the nurses that have not followed instructions as she had some cream applied on her back and there was a different dressing. Last week she's had a AV fistula placed on her left forearm. 01/13/2015 -- she has had some localized infection at the port site and she's been on doxycycline for this. 02/05/2015 - he has developed a small blister on her right lower extremity. 02/10/2015 -- she has developed another small blister on her right anterior chest wall in the area  where she's had tape for her dialysis access. this may just be injury caused by a tape burn. 02/18/2015 -- no new blisters and she had a dermatology opinion and they have taken a biopsy of her skin. Olmsted, [name] ([id number]) She also had a left brachial AV fistula placed this week. 03/03/2015 -- though we do not have the pathology report yet the patient says she has been put on prednisone because this skin disease is  possibly to do with her immune system and her dermatologist is recommended this. 03/10/2015 -- she has developed a new blister which is quite large on her right lower extremity on the shin. 05/22/2015 -- she did very well with her dressing but on trying to remove the [place] it peels away the newly healed skin. 06/05/2015 -- the patient was in the ER this past week for severe bleeding from the right nostril and had to have ENT see her and do a packing. They've also been holding her heparin during her dialysis. The patient is going back to ENT today. Other than that she has had no other significant issues. 06/19/2015 -- she is back on her blood thinners and continues to have her hemodialysis as before. 07/31/2015 -- a large blister has popped up again on her right lower extremity in the same place in the mid shin area in the anterior lateral compartment. 10/05/2015 -- last week she was admitted to the hospital for 4 days as she had a pneumonia and was treated with IV and oral antibiotics. Objective Constitutional Pulse regular. Respirations normal and unlabored. Afebrile. Vitals Time Taken: 9:12 AM, Height: 65 in, Weight: 156 lbs, BMI: 26, Temperature: 98.0 F, Pulse: 87 bpm, Respiratory Rate: 18 breaths/min, Blood Pressure: 171/77 mmHg. Eyes Nonicteric. Reactive to light. Ears, Nose, Mouth, and Throat Lips, teeth, and gums WNL.Marland Kitchen Moist mucosa without lesions. Neck supple and nontender. No palpable supraclavicular or cervical adenopathy. Normal sized without  goiter. Respiratory WNL. No retractions.. Breath sounds WNL, No rubs, rales, rhonchi, or wheeze.. Cardiovascular Heart rhythm and rate regular, no murmur or gallop.. Pedal Pulses WNL. No clubbing, cyanosis or edema. Lymphatic No adneopathy. No adenopathy. No adenopathy. [name] ([id number]) Musculoskeletal Adexa without tenderness or enlargement.. Digits and nails w/o clubbing, cyanosis, infection, petechiae, ischemia, or inflammatory conditions.Marland Kitchen Psychiatric Judgement and insight Intact.. No evidence of depression, anxiety, or agitation.. General Notes: R superficial ulceration on her back continues to bleed briskly on touch and surrounding it there is a fair amount of supple scar which is healthy Integumentary (Hair, Skin) No suspicious lesions. No crepitus or fluctuance. No peri-wound warmth or erythema. No masses.. Wound #1 status is Open. Original cause of wound was Blister. The wound is located on the Right Back. The wound measures 3cm length x 4cm width x 0.1cm depth; 9.425cm^2 area and 0.942cm^3 volume. The wound is limited to skin breakdown. There is no tunneling or undermining noted. There is a medium amount of serosanguineous drainage noted. The wound margin is distinct with the outline attached to the wound base. There is large (67-100%) red, friable granulation within the wound bed. There is no necrotic tissue within the wound bed. The periwound skin appearance exhibited: Friable, Scarring, Moist. The periwound skin appearance did not exhibit: Callus, Crepitus, Excoriation, Fluctuance, Induration, Localized Edema, Rash, Dry/Scaly, Maceration, Atrophie Blanche, Cyanosis, Ecchymosis, Hemosiderin Staining, Mottled, Pallor, Rubor, Erythema. Periwound temperature was noted as No Abnormality. Assessment Active Problems ICD-10 E11.622 - Type 2 diabetes mellitus with other skin ulcer L89.112 - Pressure ulcer of right upper back, stage 2 S80.821A - Blister (nonthermal),  right lower leg, initial encounter N18.6 - End stage renal disease Plan Wound Cleansing: Wound #1 Right Back: Clean wound with Normal Saline. Skin Barriers/Peri-Wound Care: Wound #1 Right Back: JERUSHA, LAMOREE ([id number]) Skin Prep Primary Wound Dressing: Wound #1 Right Back: Cutimed Siltec Sorbact Dressing Change Frequency: Wound #1 Right Back: Other: - Change dressing if needed due to drainage. Follow-up Appointments: Wound #1 Right  Back: Return Appointment in 1 week. We will use Siltec Sorbact foam dressing over this and change it as required. She will come back to see me next week Electronic Signature(s) Signed: 01/15/2016 10:25:29 AM By: [name], [name] Entered By: [name] on 01/15/2016 10:25:29 Olguin, [name] ([id number]) -------------------------------------------------------------------------------- SuperBill Details Patient Name: [name] Date of Service: 01/15/2016 Medical Record Number: [id number] Patient Account Number: [id number] Date of Birth/Sex: 12-24-1955 (60 y.o. Female) Treating RN: Cornell Barman Primary Care Physician: [name] Other Clinician: Referring Physician: [name] Treating Physician/Extender: Frann Rider in Treatment: 55 Diagnosis Coding ICD-10 Codes Code Description E11.622 Type 2 diabetes mellitus with other skin ulcer L89.112 Pressure ulcer of right upper back, stage 2 S80.821A Blister (nonthermal), right lower leg, initial encounter N18.6 End stage renal disease Facility Procedures CPT4 Code: [id number] Description: [phone] - WOUND CARE VISIT-LEV 2 EST PT Modifier: Quantity: 1 Physician Procedures CPT4 Code: [id number] Description: [id number] - WC PHYS LEVEL 3 - EST PT ICD-10 Description Diagnosis E11.622 Type 2 diabetes mellitus with other skin ulcer L89.112 Pressure ulcer of right upper back, stage 2 S80.821A Blister (nonthermal), right lower leg, initial enc N18.6 End  stage renal disease Modifier:  ounter Quantity: 1 Electronic Signature(s) Signed: 01/15/2016 10:25:44 AM By: [name], [name] Entered By: [name] on 01/15/2016 10:25:44

## 2016-01-16 NOTE — Progress Notes (Signed)
[name], [name] ([id number]) Visit Report for 01/15/2016 Arrival Information Details [name], [name]. Date of Service: 01/15/2016 9:15 AM Medical Record Number: [id number] Patient Account Number: [id number] Date of Birth/Sex: [birth year] (60 y.o. Female) Treating RN: Cornell Barman Primary Care Physician: [name] Other Clinician: Referring Physician: [name] Treating Physician/Extender: Frann Rider in Treatment: 28 Visit Information History Since Last Visit Added or deleted any medications: No Patient Arrived: [place] Any new allergies or adverse reactions: No Arrival Time: 09:11 Had a fall or experienced change in No Accompanied By: self activities of daily living that may affect Transfer Assistance: None risk of falls: Patient Identification Verified: Yes Signs or symptoms of abuse/neglect since last No Secondary Verification Process Yes visito Completed: Hospitalized since last visit: No Patient Requires Transmission- No Has Dressing in Place as Prescribed: Yes Based Precautions: Pain Present Now: No Patient Has Alerts: Yes Patient Alerts: Patient on Blood Thinner Electronic Signature(s) Signed: 01/15/2016 4:25:19 PM By: Gretta Cool, RN, [name], Kim RN, [name] Entered By: Gretta Cool, RN, [name], Kim on 01/15/2016 09:12:23 Hinchey, [name] ([id number]) -------------------------------------------------------------------------------- Clinic Level of Care Assessment Details [name]. Date of Service: 01/15/2016 9:15 AM Medical Record Number: [id number] Patient Account Number: [id number] Date of Birth/Sex: 08-Jan-1956 (60 y.o. Female) Treating RN: Cornell Barman Primary Care Physician: [name] Other Clinician: Referring Physician: [name] Treating Physician/Extender: Frann Rider in Treatment: 74 Clinic Level of Care Assessment Items TOOL 4 Quantity Score []  - Use when only an EandM is performed on FOLLOW-UP visit 0 ASSESSMENTS -  Nursing Assessment / Reassessment []  - Reassessment of Co-morbidities (includes updates in patient status) 0 X - Reassessment of Adherence to Treatment Plan 1 5 ASSESSMENTS - Wound and Skin Assessment / Reassessment X - Simple Wound Assessment / Reassessment - one wound 1 5 []  - Complex Wound Assessment / Reassessment - multiple wounds 0 []  - Dermatologic / Skin Assessment (not related to wound area) 0 ASSESSMENTS - Focused Assessment []  - Circumferential Edema Measurements - multi extremities 0 []  - Nutritional Assessment / Counseling / Intervention 0 []  - Lower Extremity Assessment (monofilament, tuning fork, pulses) 0 []  - Peripheral Arterial Disease Assessment (using hand held doppler) 0 ASSESSMENTS - Ostomy and/or Continence Assessment and Care []  - Incontinence Assessment and Management 0 []  - Ostomy Care Assessment and Management (repouching, etc.) 0 PROCESS - Coordination of Care X - Simple Patient / Family Education for ongoing care 1 15 []  - Complex (extensive) Patient / Family Education for ongoing care 0 X - Staff obtains Programmer, systems, Records, Test Results / Process Orders 1 10 []  - Staff telephones HHA, Nursing Homes / Clarify orders / etc 0 []  - Routine Transfer to another Facility (non-emergent condition) 0 [name], [name] ([id number]) []  - Routine Hospital Admission (non-emergent condition) 0 []  - New Admissions / Biomedical engineer / Ordering NPWT, Apligraf, etc. 0 []  - Emergency Hospital Admission (emergent condition) 0 X - Simple Discharge Coordination 1 10 []  - Complex (extensive) Discharge Coordination 0 PROCESS - Special Needs []  - Pediatric / Minor Patient Management 0 []  - Isolation Patient Management 0 []  - Hearing / Language / Visual special needs 0 []  - Assessment of Community assistance (transportation, D/C planning, etc.) 0 []  - Additional assistance / Altered mentation 0 []  - Support Surface(s) Assessment (bed, cushion, seat, etc.) 0 INTERVENTIONS -  Wound Cleansing / Measurement X - Simple Wound Cleansing - one wound 1 5 []  - Complex Wound Cleansing - multiple wounds 0 X -  Wound Imaging (photographs - any number of wounds) 1 5 []  - Wound Tracing (instead of photographs) 0 X - Simple Wound Measurement - one wound 1 5 []  - Complex Wound Measurement - multiple wounds 0 INTERVENTIONS - Wound Dressings X - Small Wound Dressing one or multiple wounds 1 10 []  - Medium Wound Dressing one or multiple wounds 0 []  - Large Wound Dressing one or multiple wounds 0 []  - Application of Medications - topical 0 []  - Application of Medications - injection 0 INTERVENTIONS - Miscellaneous []  - External ear exam 0 [name], [name] ([id number]) []  - Specimen Collection (cultures, biopsies, blood, body fluids, etc.) 0 []  - Specimen(s) / Culture(s) sent or taken to Lab for analysis 0 []  - Patient Transfer (multiple staff / Harrel Lemon Lift / Similar devices) 0 []  - Simple Staple / Suture removal (25 or less) 0 []  - Complex Staple / Suture removal (26 or more) 0 []  - Hypo / Hyperglycemic Management (close monitor of Blood Glucose) 0 []  - Ankle / Brachial Index (ABI) - do not check if billed separately 0 X - Vital Signs 1 5 Has the patient been seen at the hospital within the last three years: Yes Total Score: 75 Level Of Care: New/Established - Level 2 Electronic Signature(s) Signed: 01/15/2016 4:25:19 PM By: Gretta Cool, RN, [name], Kim RN, [name] Entered By: Gretta Cool, RN, [name], Kim on 01/15/2016 09:22:27 Blazier, [name] ([id number]) -------------------------------------------------------------------------------- Encounter Discharge Information Details [name], [name]. Date of Service: 01/15/2016 9:15 AM Medical Record Number: [id number] Patient Account Number: [id number] Date of Birth/Sex: 12-17-1955 (60 y.o. Female) Treating RN: Cornell Barman Primary Care Physician: [name] Other Clinician: Referring Physician: [name] Treating Physician/Extender:  Frann Rider in Treatment: 48 Encounter Discharge Information Items Discharge Pain Level: 0 Discharge Condition: Stable Ambulatory Status: Wheelchair Discharge Destination: Home Transportation: Private Auto Accompanied By: self Schedule Follow-up Appointment: Yes Medication Reconciliation completed and provided to Patient/Care Yes [place] Dolinger: Provided on Clinical Summary of Care: 01/15/2016 Form Type Recipient Paper Patient LD Electronic Signature(s) Signed: 01/15/2016 9:29:38 AM By: Ruthine Dose Entered By: Ruthine Dose on 01/15/2016 09:29:38 Rideaux, Arie Chauncey Cruel ([id number]) -------------------------------------------------------------------------------- Multi Wound Chart Details [name], [name] Date of Service: 01/15/2016 9:15 AM Medical Record Number: [id number] Patient Account Number: [id number] Date of Birth/Sex: 08-05-1955 (60 y.o. Female) Treating RN: Cornell Barman Primary Care Physician: [name] Other Clinician: Referring Physician: [name] Treating Physician/Extender: Frann Rider in Treatment: 55 Vital Signs Height(in): 65 Pulse(bpm): 87 Weight(lbs): 156 Blood Pressure 171/77 (mmHg): Body Mass Index(BMI): 26 Temperature(F): 98.0 Respiratory Rate 18 (breaths/min): Photos: [N/A:N/A] Wound Location: Right Back N/A N/A Wounding Event: Blister N/A N/A Primary Etiology: Pressure Ulcer N/A N/A Comorbid History: Glaucoma, Anemia, N/A N/A Coronary Artery Disease, Hypertension, Type II Diabetes, End Stage Renal Disease, Osteoarthritis, Neuropathy Date Acquired: 10/19/2013 N/A N/A Weeks of Treatment: 55 N/A N/A Wound Status: Open N/A N/A Measurements L x W x D 3x4x0.1 N/A N/A (cm) Area (cm) : 9.425 N/A N/A Volume (cm) : 0.942 N/A N/A % Reduction in Area: 75.50% N/A N/A % Reduction in Volume: 75.50% N/A N/A Classification: Category/Stage II N/A N/A Exudate Amount: Medium N/A N/A Exudate Type: Serosanguineous N/A  N/A Exudate Color: red, brown N/A N/A Wound Margin: Distinct, outline attached N/A N/A Bonfield, [name] S. ([id number]) Granulation Amount: Large (67-100%) N/A N/A Granulation Quality: Red, Friable N/A N/A Necrotic Amount: None Present (0%) N/A N/A Exposed Structures: Fascia: No N/A N/A Fat: No Tendon: No Muscle: No Joint: No Bone:  No Limited to Skin Breakdown Epithelialization: Large (67-100%) N/A N/A Periwound Skin Texture: Friable: Yes N/A N/A Scarring: Yes Edema: No Excoriation: No Induration: No Callus: No Crepitus: No Fluctuance: No Rash: No Periwound Skin Moist: Yes N/A N/A Moisture: Maceration: No Dry/Scaly: No Periwound Skin Color: Atrophie Blanche: No N/A N/A Cyanosis: No Ecchymosis: No Erythema: No Hemosiderin Staining: No Mottled: No Pallor: No Rubor: No Temperature: No Abnormality N/A N/A Tenderness on No N/A N/A Palpation: Wound Preparation: Ulcer Cleansing: N/A N/A Rinsed/Irrigated with Saline Topical Anesthetic Applied: None Treatment Notes Electronic Signature(s) Signed: 01/15/2016 4:25:19 PM By: Gretta Cool, RN, [name], Kim RN, [name] Entered By: Gretta Cool, RN, [name], Kim on 01/15/2016 09:21:21 [name], [name] ([id number]) -------------------------------------------------------------------------------- [place] Details [name], POSS. Date of Service: 01/15/2016 9:15 AM Medical Record Number: [id number] Patient Account Number: [id number] Date of Birth/Sex: 11-30-1955 (60 y.o. Female) Treating RN: Cornell Barman Primary Care Physician: [name] Other Clinician: Referring Physician: [name] Treating Physician/Extender: Frann Rider in Treatment: 76 Active Inactive Abuse / Safety / Falls / Self Care Management Nursing Diagnoses: Potential for falls Goals: Patient will remain injury free Date Initiated: 12/23/2014 Goal Status: Active Patient/caregiver will verbalize understanding of skin care regimen Date  Initiated: 12/23/2014 Goal Status: Active Patient/caregiver will verbalize/demonstrate measures taken to prevent injury and/or falls Date Initiated: 12/23/2014 Goal Status: Active Patient/caregiver will verbalize/demonstrate understanding of what to do in case of emergency Date Initiated: 12/23/2014 Goal Status: Active Interventions: Assess fall risk on admission and as needed Assess: immobility, friction, shearing, incontinence upon admission and as needed Assess impairment of mobility on admission and as needed per policy Assess self care needs on admission and as needed Provide education on fall prevention Notes: Wound/Skin Impairment Nursing Diagnoses: Impaired tissue integrity Knowledge deficit related to ulceration/compromised skin integrity Goals: Patient/caregiver will verbalize understanding of skin care regimen [name], [name] ([id number]) Date Initiated: 12/23/2014 Goal Status: Active Ulcer/skin breakdown will heal within 14 weeks Date Initiated: 12/23/2014 Goal Status: Active Interventions: Assess patient/caregiver ability to obtain necessary supplies Assess patient/caregiver ability to perform ulcer/skin care regimen upon admission and as needed Assess ulceration(s) every visit Provide education on ulcer and skin care Treatment Activities: Skin care regimen initiated : 11/05/2015 Topical wound management initiated : 11/05/2015 Notes: Electronic Signature(s) Signed: 01/15/2016 4:25:19 PM By: Gretta Cool, RN, [name], Kim RN, [name] Entered By: Gretta Cool, RN, [name], Kim on 01/15/2016 09:21:12 [name], [name] ([id number]) -------------------------------------------------------------------------------- Pain Assessment Details [name]. Date of Service: 01/15/2016 9:15 AM Medical Record Number: [id number] Patient Account Number: [id number] Date of Birth/Sex: Aug 17, 1955 (60 y.o. Female) Treating RN: Cornell Barman Primary Care Physician: [name] Other Clinician: Referring  Physician: [name] Treating Physician/Extender: Frann Rider in Treatment: 5 Active Problems Location of Pain Severity and Description of Pain Patient Has Paino No Site Locations With Dressing Change: No Pain Management and Medication Current Pain Management: Electronic Signature(s) Signed: 01/15/2016 4:25:19 PM By: Gretta Cool, RN, [name], Kim RN, [name] Entered By: Gretta Cool, RN, [name], Kim on 01/15/2016 09:12:30 [name], [name] ([id number]) -------------------------------------------------------------------------------- Wound Assessment Details [name], [name]. Date of Service: 01/15/2016 9:15 AM Medical Record Number: [id number] Patient Account Number: [id number] Date of Birth/Sex: [birth year] (60 y.o. Female) Treating RN: Cornell Barman Primary Care Physician: [name] Other Clinician: Referring Physician: [name] Treating Physician/Extender: Frann Rider in Treatment: 76 Wound Status Wound Number: 1 Primary Pressure Ulcer Etiology: Wound Location: Right Back Wound Open Wounding Event: Blister Status: Date Acquired: 10/19/2013 Comorbid Glaucoma, Anemia, Coronary  Artery Weeks Of Treatment: 55 History: Disease, Hypertension, Type II Clustered Wound: No Diabetes, End Stage Renal Disease, Osteoarthritis, Neuropathy Photos Wound Measurements Length: (cm) 3 Width: (cm) 4 Depth: (cm) 0.1 Area: (cm) 9.425 Volume: (cm) 0.942 % Reduction in Area: 75.5% % Reduction in Volume: 75.5% Epithelialization: Large (67-100%) Tunneling: No Undermining: No Wound Description Classification: Category/Stage II Wound Margin: Distinct, outline attached Exudate Amount: Medium Exudate Type: Serosanguineous Exudate Color: red, brown Foul Odor After Cleansing: No Wound Bed Granulation Amount: Large (67-100%) Exposed Structure Granulation Quality: Red, Friable Fascia Exposed: No Necrotic Amount: None Present (0%) Fat Layer Exposed: No Tendon Exposed:  No Burstein, Azalyn S. ([id number]) Muscle Exposed: No Joint Exposed: No Bone Exposed: No Limited to Skin Breakdown Periwound Skin Texture Texture Color No Abnormalities Noted: No No Abnormalities Noted: No Callus: No Atrophie Blanche: No Crepitus: No Cyanosis: No Excoriation: No Ecchymosis: No Fluctuance: No Erythema: No Friable: Yes Hemosiderin Staining: No Induration: No Mottled: No Localized Edema: No Pallor: No Rash: No Rubor: No Scarring: Yes Temperature / Pain Moisture Temperature: No Abnormality No Abnormalities Noted: No Dry / Scaly: No Maceration: No Moist: Yes Wound Preparation Ulcer Cleansing: Rinsed/Irrigated with Saline Topical Anesthetic Applied: None Treatment Notes Wound #1 (Right Back) 1. Cleansed with: Clean wound with Normal Saline 4. Dressing Applied: Other dressing (specify in notes) Notes [employer] scientist) Signed: 01/15/2016 4:25:19 PM By: Gretta Cool, RN, [name], Kim RN, [name] Entered By: Gretta Cool, RN, [name], Kim on 01/15/2016 09:17:50 [place]ira, [name] ([id number]) -------------------------------------------------------------------------------- [place] Details [name]. Date of Service: 01/15/2016 9:15 AM Medical Record Number: [id number] Patient Account Number: [id number] Date of Birth/Sex: 04-May-1956 (60 y.o. Female) Treating RN: Cornell Barman Primary Care Physician: [name] Other Clinician: Referring Physician: [name] Treating Physician/Extender: Frann Rider in Treatment: 36 Vital Signs Time Taken: 09:12 Temperature (F): 98.0 Height (in): 65 Pulse (bpm): 87 Weight (lbs): 156 Respiratory Rate (breaths/min): 18 Body Mass Index (BMI): 26 Blood Pressure (mmHg): 171/77 Reference Range: 80 - 120 mg / dl Electronic Signature(s) Signed: 01/15/2016 4:25:19 PM By: Gretta Cool, RN, [name], Kim RN, [name] Entered By: Gretta Cool, RN, [name], Kim on 01/15/2016 09:13:12

## 2016-01-19 IMAGING — CT CT ABD-PELV W/O CM
2 of 4 series · 15 of 46 positions shown, 17 images · non-contrast
Comparison: Multiple priors, most recently 11/04/2011.

CLINICAL DATA: Abdominal pain since [REDACTED]. Fever. Patient on
peritoneal dialysis.

EXAM:
CT ABDOMEN AND PELVIS WITHOUT CONTRAST
TECHNIQUE: Multidetector CT imaging of the abdomen and pelvis was performed
following the standard protocol without intravenous contrast.

[Series 2: routine abd pel without · axial · non-contrast · 0.78mm/px · z∈[-985,-570]mm · 12 of 91 slices shown, 14 images]
[im 4/91  soft-tissue]
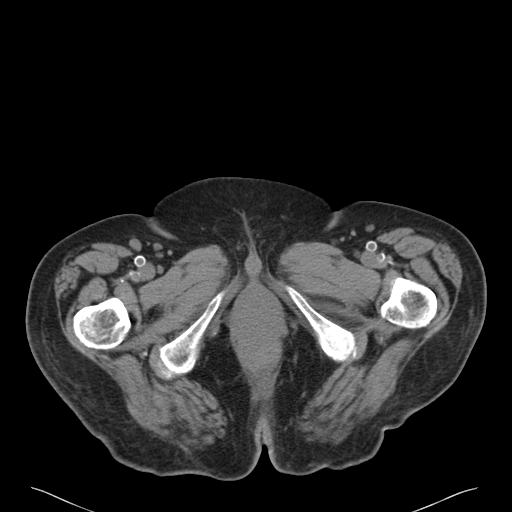
[im 4/91  bone]
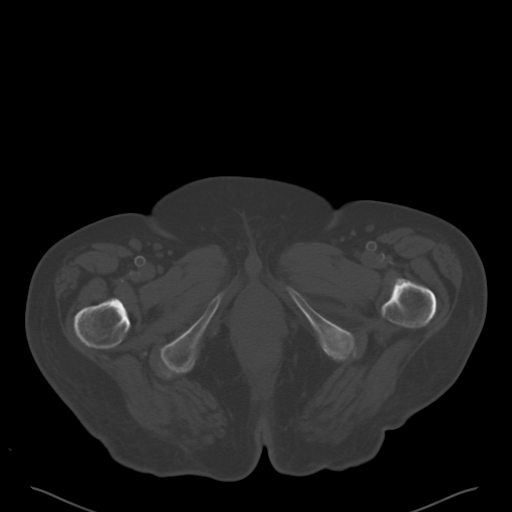
[im 12/91  soft-tissue]
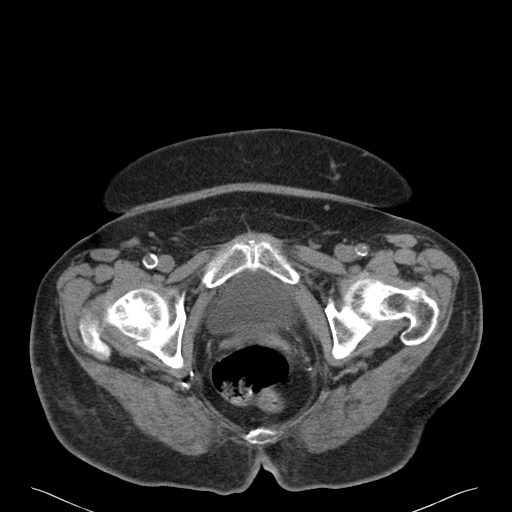
[im 20/91  soft-tissue]
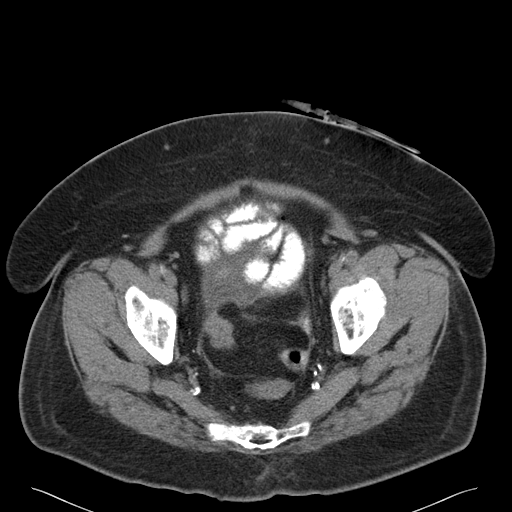
[im 28/91  soft-tissue]
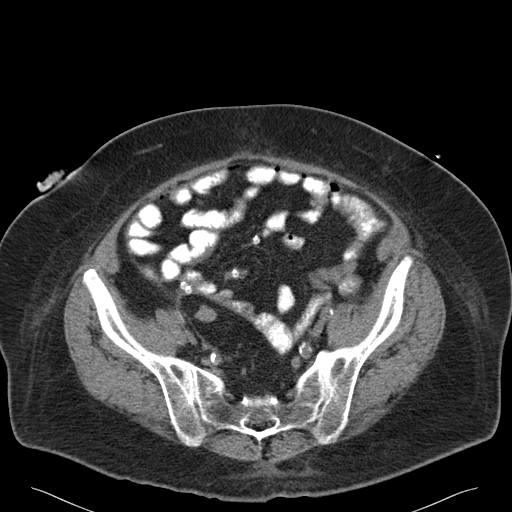
[im 36/91  soft-tissue]
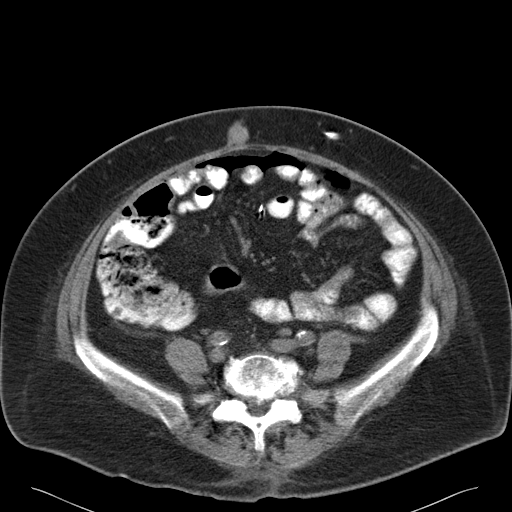
[im 44/91  soft-tissue]
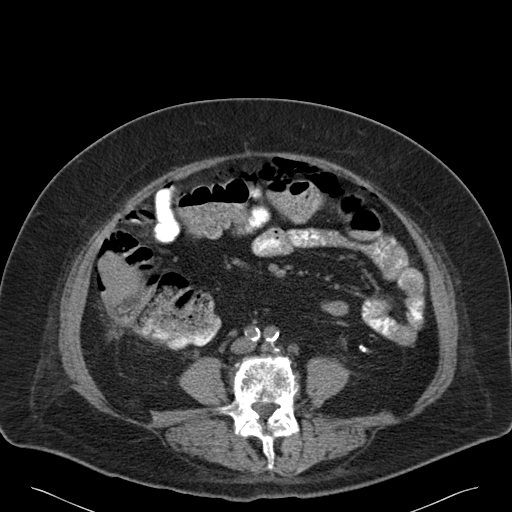
[im 47/91  soft-tissue]
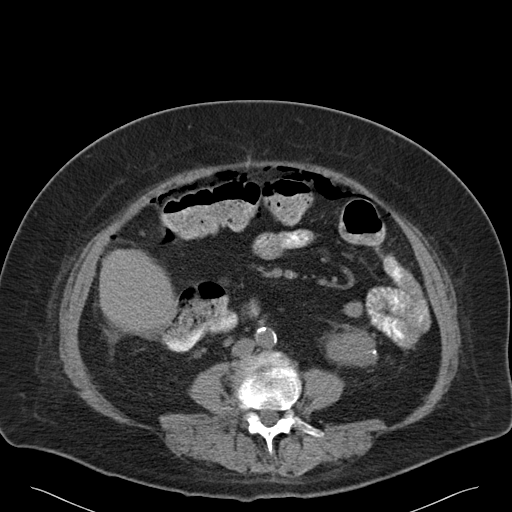
[im 55/91  soft-tissue]
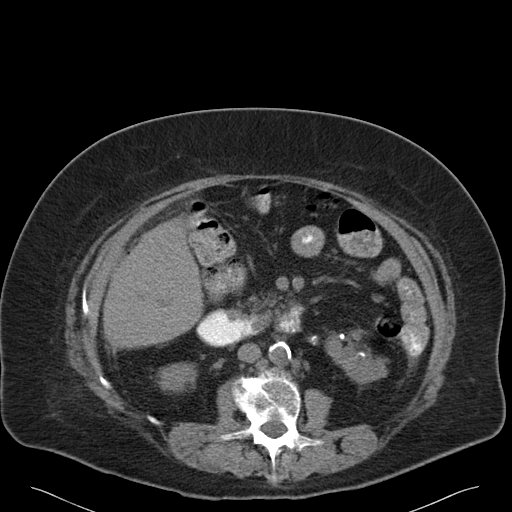
[im 63/91  soft-tissue]
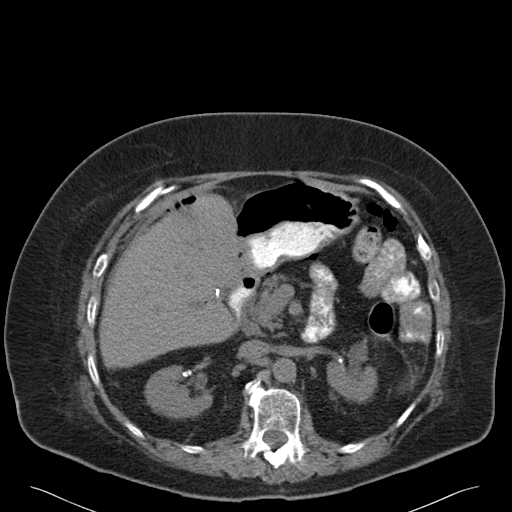
[im 63/91  bone]
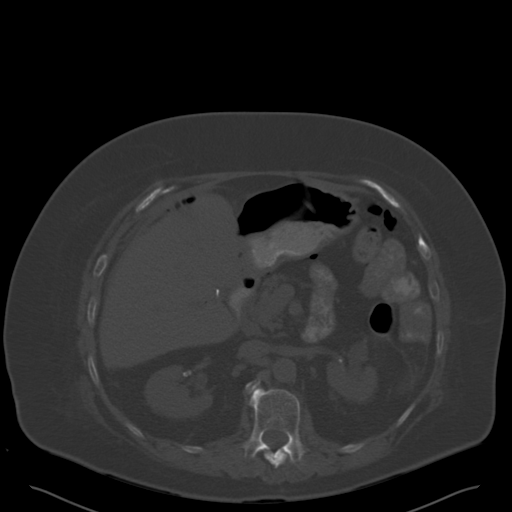
[im 71/91  soft-tissue]
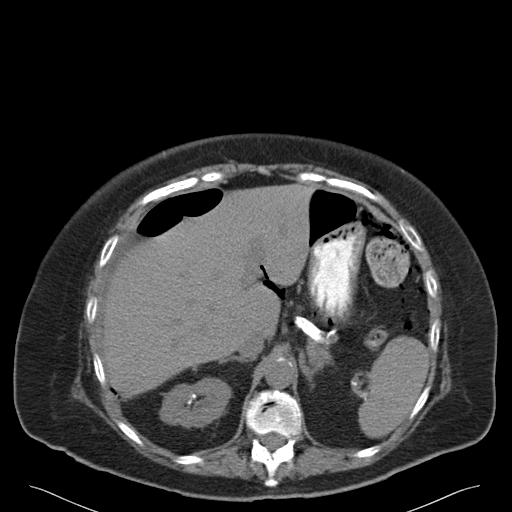
[im 79/91  soft-tissue]
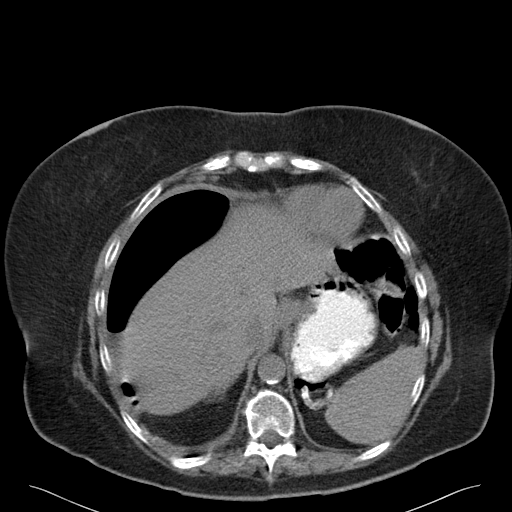
[im 87/91  soft-tissue]
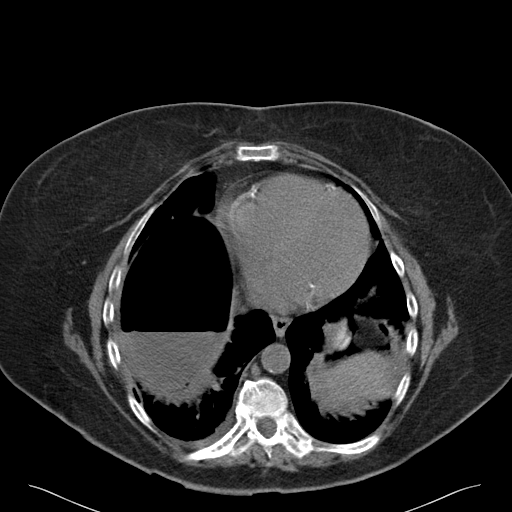

[Series 5: cor routine abd pel wo · coronal · 0.79mm/px · 3 of 149 slices shown]
[im 50/149  soft-tissue]
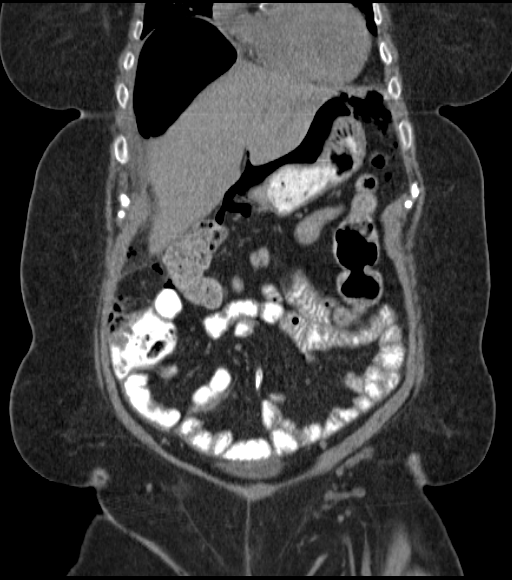
[im 66/149  soft-tissue]
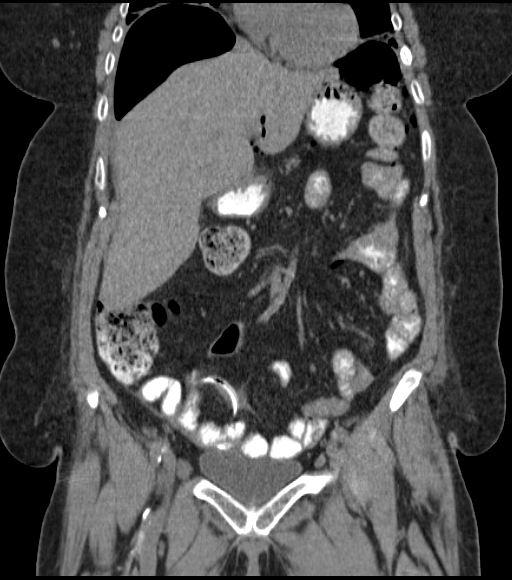
[im 83/149  soft-tissue]
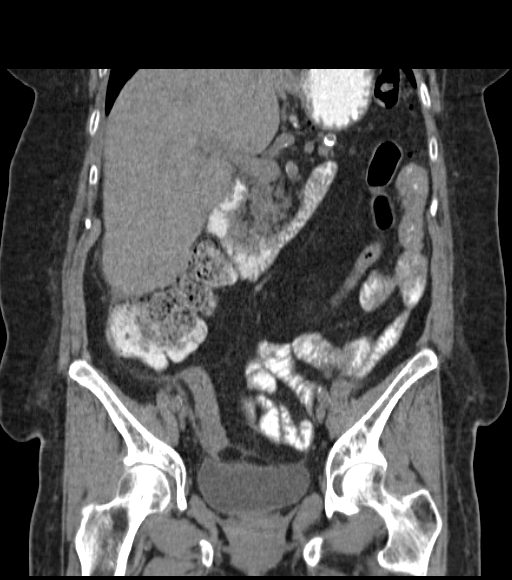

[15 of 46 positions shown; findings below may reference images not displayed]

FINDINGS: Lung Bases: Extensive atelectasis and/or scarring in the right
middle and lower lobes. Elevated right hemidiaphragm. Extensive
atherosclerotic calcifications throughout the left anterior
descending, left circumflex and right coronary arteries.

Abdomen/Pelvis: Large volume of pneumoperitoneum, greater than is
typically seen in the setting of peritoneal dialysis. Small volume
of peritoneal dialysate noted. Status post cholecystectomy. The
unenhanced appearance of the liver, spleen and bilateral adrenal
glands is unremarkable. The pancreas is atrophic. Bilateral kidneys
are atrophic, and there are post procedural changes of prior
ablation procedure in the lateral aspect of the lower pole of the
left kidney, where there is some distorted soft tissue and
perinephric fat. Evaluation for local recurrence of disease is not
possible on today's non contrast CT examination, however, this area
does not have a masslike appearance at this time. Numerous
calcifications in the renal hila bilaterally are favored to be
vascular. Status post hysterectomy. Ovaries are not confidently
identified may be surgically absent or atrophic. No pathologic
distention of small bowel. No extravasation of oral contrast
material from the small bowel or colon. No definite lymphadenopathy
identified within the abdomen or pelvis. Numerous borderline
enlarged retroperitoneal lymph nodes are noted, but are nonspecific.
Urinary bladder is unremarkable in appearance.

Musculoskeletal: There are no aggressive appearing lytic or blastic
lesions noted in the visualized portions of the skeleton. Multilevel
degenerative disc disease throughout the visualized thoracolumbar
spine.
IMPRESSION: 1. Large volume of pneumoperitoneum, in excess of that typically
seen in the setting of peritoneal dialysis. Given the lack of
evidence of bowel obstruction, and no overt signs of bowel
perforation, these findings are concerning for potential infection
with gas-forming organisms. Clinical correlation for signs and
symptoms of peritonitis is recommended at this time.
2. Post procedural changes of ablation in the lateral aspect of the
lower pole of the left kidney. The region has an appearance
compatible with post ablation defect, without definite recurrence of
disease, although assessment for recurrence is exceedingly limited
on today's non contrast CT examination. Followup contrast-enhanced
MRI may be of use to assess for disease recurrence if clinically
appropriate.
3. Extensive atherosclerosis, including at least 3 vessel coronary
artery disease.
4. Status post cholecystectomy and hysterectomy.
5. Additional incidental findings, as above.

## 2016-01-22 ENCOUNTER — Encounter: Payer: Medicare Other | Attending: Surgery | Admitting: Surgery

## 2016-01-22 DIAGNOSIS — X58XXXA Exposure to other specified factors, initial encounter: Secondary | ICD-10-CM | POA: Diagnosis not present

## 2016-01-22 DIAGNOSIS — Z992 Dependence on renal dialysis: Secondary | ICD-10-CM | POA: Insufficient documentation

## 2016-01-22 DIAGNOSIS — E785 Hyperlipidemia, unspecified: Secondary | ICD-10-CM | POA: Insufficient documentation

## 2016-01-22 DIAGNOSIS — E1122 Type 2 diabetes mellitus with diabetic chronic kidney disease: Secondary | ICD-10-CM | POA: Diagnosis not present

## 2016-01-22 DIAGNOSIS — E11622 Type 2 diabetes mellitus with other skin ulcer: Secondary | ICD-10-CM | POA: Insufficient documentation

## 2016-01-22 DIAGNOSIS — D649 Anemia, unspecified: Secondary | ICD-10-CM | POA: Insufficient documentation

## 2016-01-22 DIAGNOSIS — S80821A Blister (nonthermal), right lower leg, initial encounter: Secondary | ICD-10-CM | POA: Insufficient documentation

## 2016-01-22 DIAGNOSIS — I2510 Atherosclerotic heart disease of native coronary artery without angina pectoris: Secondary | ICD-10-CM | POA: Diagnosis not present

## 2016-01-22 DIAGNOSIS — L89112 Pressure ulcer of right upper back, stage 2: Secondary | ICD-10-CM | POA: Diagnosis not present

## 2016-01-22 DIAGNOSIS — N186 End stage renal disease: Secondary | ICD-10-CM | POA: Diagnosis not present

## 2016-01-22 DIAGNOSIS — I120 Hypertensive chronic kidney disease with stage 5 chronic kidney disease or end stage renal disease: Secondary | ICD-10-CM | POA: Diagnosis not present

## 2016-01-23 NOTE — Progress Notes (Signed)
AMBRIELLE, IVERY ([id number]) Visit Report for 01/22/2016 Chief Complaint Document Details [name], [name]. Date of Service: 01/22/2016 9:15 AM Medical Record Number: [id number] Patient Account Number: [id number] Date of Birth/Sex: [birth year] (60 y.o. Female) Treating RN: Cornell Barman Primary Care Physician: [name] Other Clinician: Referring Physician: [name] Treating Physician/Extender: Frann Rider in Treatment: 77 Information Obtained from: Patient Chief Complaint Patient presents to the wound care center for a consult due non healing wound. She has an open wound on her right upper back which she's had for about a year and she recently noticed a blister on her right lower extremity about 2 weeks ago. Electronic Signature(s) Signed: 01/22/2016 9:56:49 AM By: [name], [name] Entered By: [name] on 01/22/2016 09:56:49 Ting, [name] ([id number]) -------------------------------------------------------------------------------- HPI Details [name]. Date of Service: 01/22/2016 9:15 AM Medical Record Number: [id number] Patient Account Number: [id number] Date of Birth/Sex: 1956/06/04 (60 y.o. Female) Treating RN: Cornell Barman Primary Care Physician: [name] Other Clinician: Referring Physician: [name] Treating Physician/Extender: Frann Rider in Treatment: 35 History of Present Illness Location: right upper back and right lower extremity wounds Quality: Patient reports No Pain. Severity: Patient states wound (s) are getting better. Duration: Patient has had the wound for > 3 months prior to seeking treatment at the wound center Timing: she thought it first occurred when she was using a heating pad about a year ago after back surgery. Context: The wound appeared gradually over time Modifying Factors: Patient is currently on renal dialysis and receives treatments 3 times weekly Associated Signs and Symptoms:  Patient reports having: surgery scheduled for this week for a AV fistula left arm. HPI Description: 60 year old patient who is known to be a diabetic and has end-stage renal disease has had several comorbidities including coronary artery disease, hypertension, hyperlipidemia, pancreatitis, anemia, previous history of hysterectomy, cholecystectomy, left-sided salivary gland excision, bilateral cataract surgery,Peritoneal dialysis catheter, hemodialysis catheter. the area on the back has also been caused by instant pressure she used to sleep on a recliner all day and has significant kyphoscoliosis. As far as the wound on her right lower extremity she's not sure how this blister occurred but she thought it has been there for about 2 weeks. No recent blood investigations available and no recent hemoglobin A1c. 12/30/2014 -- she is an assisted living facility but I believe the nurses that have not followed instructions as she had some cream applied on her back and there was a different dressing. Last week she's had a AV fistula placed on her left forearm. 01/13/2015 -- she has had some localized infection at the port site and she's been on doxycycline for this. 02/05/2015 - he has developed a small blister on her right lower extremity. 02/10/2015 -- she has developed another small blister on her right anterior chest wall in the area where she's had tape for her dialysis access. this may just be injury caused by a tape burn. 02/18/2015 -- no new blisters and she had a dermatology opinion and they have taken a biopsy of her skin. She also had a left brachial AV fistula placed this week. 03/03/2015 -- though we do not have the pathology report yet the patient says she has been put on prednisone because this skin disease is possibly to do with her immune system and her dermatologist is recommended this. 03/10/2015 -- she has developed a new blister which is quite large on her right lower extremity on  the shin.  05/22/2015 -- she did very well with her dressing but on trying to remove the [place] it peels away the newly healed skin. WAFA, EGERT ([id number]) 06/05/2015 -- the patient was in the ER this past week for severe bleeding from the right nostril and had to have ENT see her and do a packing. They've also been holding her heparin during her dialysis. The patient is going back to ENT today. Other than that she has had no other significant issues. 06/19/2015 -- she is back on her blood thinners and continues to have her hemodialysis as before. 07/31/2015 -- a large blister has popped up again on her right lower extremity in the same place in the mid shin area in the anterior lateral compartment. 10/05/2015 -- last week she was admitted to the hospital for 4 days as she had a pneumonia and was treated with IV and oral antibiotics. Electronic Signature(s) Signed: 01/22/2016 9:56:53 AM By: [name], [name] Entered By: [name] on 01/22/2016 09:56:53 Consalvo, [name] ([id number]) -------------------------------------------------------------------------------- Physical Exam Details [name], 64. Date of Service: 01/22/2016 9:15 AM Medical Record Number: [id number] Patient Account Number: [id number] Date of Birth/Sex: 10-18-1955 (60 y.o. Female) Treating RN: Cornell Barman Primary Care Physician: [name] Other Clinician: Referring Physician: [name] Treating Physician/Extender: Frann Rider in Treatment: 73 Constitutional . Pulse regular. Respirations normal and unlabored. Afebrile. . Eyes Nonicteric. Reactive to light. Ears, Nose, Mouth, and Throat Lips, teeth, and gums WNL.Marland Kitchen Moist mucosa without lesions. Neck supple and nontender. No palpable supraclavicular or cervical adenopathy. Normal sized without goiter. Respiratory WNL. No retractions.. Cardiovascular Pedal Pulses WNL. No clubbing, cyanosis or edema. Lymphatic No  adneopathy. No adenopathy. No adenopathy. Musculoskeletal Adexa without tenderness or enlargement.. Digits and nails w/o clubbing, cyanosis, infection, petechiae, ischemia, or inflammatory conditions.. Integumentary (Hair, Skin) No suspicious lesions. No crepitus or fluctuance. No peri-wound warmth or erythema. No masses.Marland Kitchen Psychiatric Judgement and insight Intact.. No evidence of depression, anxiety, or agitation.. Notes the wound is superficial but continues to lead briskly on touch and has significant amount of healthy granulation tissue Electronic Signature(s) Signed: 01/22/2016 9:57:20 AM By: [name], [name] Entered By: [name] on 01/22/2016 09:57:19 Hank, [name] ([id number]) -------------------------------------------------------------------------------- Physician Orders Details Patient Name: [name] Date of Service: 01/22/2016 9:15 AM Medical Record Number: [id number] Patient Account Number: [id number] Date of Birth/Sex: [birth year] (60 y.o. Female) Treating RN: Cornell Barman Primary Care Physician: [name] Other Clinician: Referring Physician: [name] Treating Physician/Extender: Frann Rider in Treatment: 69 Verbal / Phone Orders: Yes Clinician: Cornell Barman Read Back and Verified: No Diagnosis Coding Wound Cleansing Wound #1 Right Back o Clean wound with Normal Saline. Skin Barriers/Peri-Wound Care Wound #1 Right Back o Skin Prep Primary Wound Dressing Wound #1 Right Back o Other: Secondary Dressing Wound #1 Right Back o Boardered Foam Dressing Dressing Change Frequency Wound #1 Right Back o Other: - Change dressing if needed due to drainage. Follow-up Appointments Wound #1 Right Back o Return Appointment in 1 week. Electronic Signature(s) Signed: 01/22/2016 3:36:55 PM By: [name], [name] Signed: 01/22/2016 4:56:16 PM By: Gretta Cool RN, [name], Kim RN, [name] Entered By: Gretta Cool, RN, [name], Kim on 01/22/2016 09:33:14 Kelson,  [name] ([id number]) -------------------------------------------------------------------------------- Problem List Details Patient Name: RUKSANA, OSINSKI. Date of Service: 01/22/2016 9:15 AM Medical Record Number: [id number] Patient Account Number: [id number] Date of Birth/Sex: 09/07/1955 (60 y.o. Female) Treating RN: Cornell Barman Primary Care Physician: [name] Other Clinician: Referring Physician:  EASON, ERNEST Treating Physician/Extender: Frann Rider in Treatment: 85 Active Problems ICD-10 Encounter Code Description Active Date Diagnosis E11.622 Type 2 diabetes mellitus with other skin ulcer 12/23/2014 Yes L89.112 Pressure ulcer of right upper back, stage 2 12/23/2014 Yes S80.821A Blister (nonthermal), right lower leg, initial encounter 12/23/2014 Yes N18.6 End stage renal disease 12/23/2014 Yes Inactive Problems Resolved Problems Electronic Signature(s) Signed: 01/22/2016 9:56:43 AM By: [name], [name] Entered By: [name] on 01/22/2016 09:56:43 Garrabrant, Tiye S. ([id number]) -------------------------------------------------------------------------------- Progress Note Details Patient Name: Rempel, Jenine S. Date of Service: 01/22/2016 9:15 AM Medical Record Number: [id number] Patient Account Number: [id number] Date of Birth/Sex: 10-24-1955 (60 y.o. Female) Treating RN: Cornell Barman Primary Care Physician: [name] Other Clinician: Referring Physician: [name] Treating Physician/Extender: Frann Rider in Treatment: 36 Subjective Chief Complaint Information obtained from Patient Patient presents to the wound care center for a consult due non healing wound. She has an open wound on her right upper back which she's had for about a year and she recently noticed a blister on her right lower extremity about 2 weeks ago. History of Present Illness (HPI) The following HPI elements were documented for the patient's wound: Location: right upper back and right  lower extremity wounds Quality: Patient reports No Pain. Severity: Patient states wound (s) are getting better. Duration: Patient has had the wound for > 3 months prior to seeking treatment at the wound center Timing: she thought it first occurred when she was using a heating pad about a year ago after back surgery. Context: The wound appeared gradually over time Modifying Factors: Patient is currently on renal dialysis and receives treatments 3 times weekly Associated Signs and Symptoms: Patient reports having: surgery scheduled for this week for a AV fistula left arm. 60 year old patient who is known to be a diabetic and has end-stage renal disease has had several comorbidities including coronary artery disease, hypertension, hyperlipidemia, pancreatitis, anemia, previous history of hysterectomy, cholecystectomy, left-sided salivary gland excision, bilateral cataract surgery,Peritoneal dialysis catheter, hemodialysis catheter. the area on the back has also been caused by instant pressure she used to sleep on a recliner all day and has significant kyphoscoliosis. As far as the wound on her right lower extremity she's not sure how this blister occurred but she thought it has been there for about 2 weeks. No recent blood investigations available and no recent hemoglobin A1c. 12/30/2014 -- she is an assisted living facility but I believe the nurses that have not followed instructions as she had some cream applied on her back and there was a different dressing. Last week she's had a AV fistula placed on her left forearm. 01/13/2015 -- she has had some localized infection at the port site and she's been on doxycycline for this. 02/05/2015 - he has developed a small blister on her right lower extremity. 02/10/2015 -- she has developed another small blister on her right anterior chest wall in the area where she's had tape for her dialysis access. this may just be injury caused by a tape  burn. 02/18/2015 -- no new blisters and she had a dermatology opinion and they have taken a biopsy of her skin. Stineman, [name] ([id number]) She also had a left brachial AV fistula placed this week. 03/03/2015 -- though we do not have the pathology report yet the patient says she has been put on prednisone because this skin disease is possibly to do with her immune system and her dermatologist is recommended this. 03/10/2015 -- she has  developed a new blister which is quite large on her right lower extremity on the shin. 05/22/2015 -- she did very well with her dressing but on trying to remove the [place] it peels away the newly healed skin. 06/05/2015 -- the patient was in the ER this past week for severe bleeding from the right nostril and had to have ENT see her and do a packing. They've also been holding her heparin during her dialysis. The patient is going back to ENT today. Other than that she has had no other significant issues. 06/19/2015 -- she is back on her blood thinners and continues to have her hemodialysis as before. 07/31/2015 -- a large blister has popped up again on her right lower extremity in the same place in the mid shin area in the anterior lateral compartment. 10/05/2015 -- last week she was admitted to the hospital for 4 days as she had a pneumonia and was treated with IV and oral antibiotics. Objective Constitutional Pulse regular. Respirations normal and unlabored. Afebrile. Vitals Time Taken: 9:28 AM, Height: 65 in, Weight: 156 lbs, BMI: 26, Temperature: 98.4 F, Pulse: 90 bpm, Respiratory Rate: 18 breaths/min, Blood Pressure: 142/60 mmHg. Eyes Nonicteric. Reactive to light. Ears, Nose, Mouth, and Throat Lips, teeth, and gums WNL.Marland Kitchen Moist mucosa without lesions. Neck supple and nontender. No palpable supraclavicular or cervical adenopathy. Normal sized without goiter. Respiratory WNL. No retractions.. Cardiovascular Pedal Pulses WNL. No clubbing,  cyanosis or edema. Lymphatic No adneopathy. No adenopathy. No adenopathy. [name] ([id number]) Musculoskeletal Adexa without tenderness or enlargement.. Digits and nails w/o clubbing, cyanosis, infection, petechiae, ischemia, or inflammatory conditions.Marland Kitchen Psychiatric Judgement and insight Intact.. No evidence of depression, anxiety, or agitation.. General Notes: the wound is superficial but continues to lead briskly on touch and has significant amount of healthy granulation tissue Integumentary (Hair, Skin) No suspicious lesions. No crepitus or fluctuance. No peri-wound warmth or erythema. No masses.. Wound #1 status is Open. Original cause of wound was Blister. The wound is located on the Right Back. The wound measures 2cm length x 3cm width x 0.1cm depth; 4.712cm^2 area and 0.471cm^3 volume. The wound is limited to skin breakdown. There is no tunneling or undermining noted. There is a medium amount of serosanguineous drainage noted. The wound margin is distinct with the outline attached to the wound base. There is large (67-100%) red, friable granulation within the wound bed. There is no necrotic tissue within the wound bed. The periwound skin appearance exhibited: Friable, Scarring, Moist. The periwound skin appearance did not exhibit: Callus, Crepitus, Excoriation, Fluctuance, Induration, Localized Edema, Rash, Dry/Scaly, Maceration, Atrophie Blanche, Cyanosis, Ecchymosis, Hemosiderin Staining, Mottled, Pallor, Rubor, Erythema. Periwound temperature was noted as No Abnormality. Assessment Active Problems ICD-10 E11.622 - Type 2 diabetes mellitus with other skin ulcer L89.112 - Pressure ulcer of right upper back, stage 2 S80.821A - Blister (nonthermal), right lower leg, initial encounter N18.6 - End stage renal disease Plan Wound Cleansing: Wound #1 Right Back: Clean wound with Normal Saline. Skin Barriers/Peri-Wound Care: Wound #1 Right Back: ARYANNE, WERGIN  ([id number]) Skin Prep Primary Wound Dressing: Wound #1 Right Back: Other: Secondary Dressing: Wound #1 Right Back: Boardered Foam Dressing Dressing Change Frequency: Wound #1 Right Back: Other: - Change dressing if needed due to drainage. Follow-up Appointments: Wound #1 Right Back: Return Appointment in 1 week. We will use RTD foam dressing over this and change it as required. She will come back to see me next week Electronic Signature(s) Signed: 01/22/2016 9:57:49 AM By:  [name], [name] Entered By: [name] on 01/22/2016 09:57:48 Eichhorn, [name] ([id number]) -------------------------------------------------------------------------------- SuperBill Details Patient Name: [name] Date of Service: 01/22/2016 Medical Record Number: [id number] Patient Account Number: [id number] Date of Birth/Sex: [birth year] (60 y.o. Female) Treating RN: Cornell Barman Primary Care Physician: [name] Other Clinician: Referring Physician: [name] Treating Physician/Extender: Frann Rider in Treatment: 56 Diagnosis Coding ICD-10 Codes Code Description E11.622 Type 2 diabetes mellitus with other skin ulcer L89.112 Pressure ulcer of right upper back, stage 2 S80.821A Blister (nonthermal), right lower leg, initial encounter N18.6 End stage renal disease Facility Procedures CPT4 Code: [id number] Description: [phone] - WOUND CARE VISIT-LEV 2 EST PT Modifier: Quantity: 1 Physician Procedures CPT4 Code: [id number] Description: [id number] - WC PHYS LEVEL 3 - EST PT ICD-10 Description Diagnosis E11.622 Type 2 diabetes mellitus with other skin ulcer L89.112 Pressure ulcer of right upper back, stage 2 Modifier: Quantity: 1 Electronic Signature(s) Signed: 01/22/2016 9:58:18 AM By: [name], [name] Entered By: [name] on 01/22/2016 09:58:18

## 2016-01-23 NOTE — Progress Notes (Signed)
AKIVA, ONDERKO ([id number]) Visit Report for 01/22/2016 Arrival Information Details [name], [name]. Date of Service: 01/22/2016 9:15 AM Medical Record Number: [id number] Patient Account Number: [id number] Date of Birth/Sex: July 30, 1955 (60 y.o. Female) Treating RN: Cornell Barman Primary Care Physician: [name] Other Clinician: Referring Physician: [name] Treating Physician/Extender: Frann Rider in Treatment: 59 Visit Information History Since Last Visit Added or deleted any medications: No Patient Arrived: [place] Any new allergies or adverse reactions: No Arrival Time: 09:24 Had a fall or experienced change in No Accompanied By: self activities of daily living that may affect Transfer Assistance: None risk of falls: Patient Identification Verified: Yes Signs or symptoms of abuse/neglect since last No Secondary Verification Process Yes visito Completed: Hospitalized since last visit: No Patient Requires Transmission- No Has Dressing in Place as Prescribed: Yes Based Precautions: Pain Present Now: No Patient Has Alerts: Yes Patient Alerts: Patient on Blood Thinner Electronic Signature(s) Signed: 01/22/2016 4:56:16 PM By: Gretta Cool, RN, [name], Kim RN, [name] Entered By: Gretta Cool, RN, [name], Kim on 01/22/2016 09:28:03 Cheyney, [name] ([id number]) -------------------------------------------------------------------------------- Clinic Level of Care Assessment Details [name]. Date of Service: 01/22/2016 9:15 AM Medical Record Number: [id number] Patient Account Number: [id number] Date of Birth/Sex: 09-12-1955 (60 y.o. Female) Treating RN: Cornell Barman Primary Care Physician: [name] Other Clinician: Referring Physician: [name] Treating Physician/Extender: Frann Rider in Treatment: 56 Clinic Level of Care Assessment Items TOOL 4 Quantity Score []  - Use when only an EandM is performed on FOLLOW-UP visit 0 ASSESSMENTS - Nursing  Assessment / Reassessment []  - Reassessment of Co-morbidities (includes updates in patient status) 0 X - Reassessment of Adherence to Treatment Plan 1 5 ASSESSMENTS - Wound and Skin Assessment / Reassessment X - Simple Wound Assessment / Reassessment - one wound 1 5 []  - Complex Wound Assessment / Reassessment - multiple wounds 0 []  - Dermatologic / Skin Assessment (not related to wound area) 0 ASSESSMENTS - Focused Assessment []  - Circumferential Edema Measurements - multi extremities 0 []  - Nutritional Assessment / Counseling / Intervention 0 []  - Lower Extremity Assessment (monofilament, tuning fork, pulses) 0 []  - Peripheral Arterial Disease Assessment (using hand held doppler) 0 ASSESSMENTS - Ostomy and/or Continence Assessment and Care []  - Incontinence Assessment and Management 0 []  - Ostomy Care Assessment and Management (repouching, etc.) 0 PROCESS - Coordination of Care X - Simple Patient / Family Education for ongoing care 1 15 []  - Complex (extensive) Patient / Family Education for ongoing care 0 []  - Staff obtains Programmer, systems, Records, Test Results / Process Orders 0 []  - Staff telephones HHA, Nursing Homes / Clarify orders / etc 0 []  - Routine Transfer to another Facility (non-emergent condition) 0 Papesh, Johnnye S. ([id number]) []  - Routine Hospital Admission (non-emergent condition) 0 []  - New Admissions / Biomedical engineer / Ordering NPWT, Apligraf, etc. 0 []  - Emergency Hospital Admission (emergent condition) 0 X - Simple Discharge Coordination 1 10 []  - Complex (extensive) Discharge Coordination 0 PROCESS - Special Needs []  - Pediatric / Minor Patient Management 0 []  - Isolation Patient Management 0 []  - Hearing / Language / Visual special needs 0 []  - Assessment of Community assistance (transportation, D/C planning, etc.) 0 []  - Additional assistance / Altered mentation 0 []  - Support Surface(s) Assessment (bed, cushion, seat, etc.) 0 INTERVENTIONS - Wound  Cleansing / Measurement X - Simple Wound Cleansing - one wound 1 5 []  - Complex Wound Cleansing - multiple wounds 0 X - Wound  Imaging (photographs - any number of wounds) 1 5 []  - Wound Tracing (instead of photographs) 0 X - Simple Wound Measurement - one wound 1 5 []  - Complex Wound Measurement - multiple wounds 0 INTERVENTIONS - Wound Dressings []  - Small Wound Dressing one or multiple wounds 0 X - Medium Wound Dressing one or multiple wounds 1 15 []  - Large Wound Dressing one or multiple wounds 0 []  - Application of Medications - topical 0 []  - Application of Medications - injection 0 INTERVENTIONS - Miscellaneous []  - External ear exam 0 Duley, Ivannia S. ([id number]) []  - Specimen Collection (cultures, biopsies, blood, body fluids, etc.) 0 []  - Specimen(s) / Culture(s) sent or taken to Lab for analysis 0 []  - Patient Transfer (multiple staff / Harrel Lemon Lift / Similar devices) 0 []  - Simple Staple / Suture removal (25 or less) 0 []  - Complex Staple / Suture removal (26 or more) 0 []  - Hypo / Hyperglycemic Management (close monitor of Blood Glucose) 0 []  - Ankle / Brachial Index (ABI) - do not check if billed separately 0 X - Vital Signs 1 5 Has the patient been seen at the hospital within the last three years: Yes Total Score: 70 Level Of Care: New/Established - Level 2 Electronic Signature(s) Signed: 01/22/2016 4:56:16 PM By: Gretta Cool, RN, [name], Kim RN, [name] Entered By: Gretta Cool, RN, [name], Kim on 01/22/2016 09:33:57 Lacroix, [name] ([id number]) -------------------------------------------------------------------------------- Encounter Discharge Information Details [name]. Date of Service: 01/22/2016 9:15 AM Medical Record Number: [id number] Patient Account Number: [id number] Date of Birth/Sex: July 21, 1955 (60 y.o. Female) Treating RN: Cornell Barman Primary Care Physician: [name] Other Clinician: Referring Physician: [name] Treating Physician/Extender: Frann Rider in Treatment: 74 Encounter Discharge Information Items Discharge Pain Level: 0 Discharge Condition: Stable Ambulatory Status: [place] Discharge Destination: Home Transportation: Private Auto Accompanied By: self Schedule Follow-up Appointment: Yes Medication Reconciliation completed Yes and provided to Patient/Care Bren Borys: Provided on Clinical Summary of Care: 01/22/2016 Form Type Recipient Paper Patient LD Electronic Signature(s) Signed: 01/22/2016 9:39:38 AM By: Ruthine Dose Entered By: Ruthine Dose on 01/22/2016 09:39:38 Kilmartin, [name] ([id number]) -------------------------------------------------------------------------------- Lower Extremity Assessment Details [name], Cayla S. Date of Service: 01/22/2016 9:15 AM Medical Record Number: [id number] Patient Account Number: [id number] Date of Birth/Sex: June 29, 1956 (60 y.o. Female) Treating RN: Cornell Barman Primary Care Physician: [name] Other Clinician: Referring Physician: [name] Treating Physician/Extender: Frann Rider in Treatment: 86 Electronic Signature(s) Signed: 01/22/2016 4:56:16 PM By: Gretta Cool, RN, [name], Kim RN, [name] Entered By: Gretta Cool, RN, [name], Kim on 01/22/2016 09:28:37 Guillot, [name] ([id number]) -------------------------------------------------------------------------------- Multi Wound Chart Details Patient Name: AVANGELINA, RAVENCRAFT. Date of Service: 01/22/2016 9:15 AM Medical Record Number: [id number] Patient Account Number: [id number] Date of Birth/Sex: Sep 29, 1955 (60 y.o. Female) Treating RN: Cornell Barman Primary Care Physician: [name] Other Clinician: Referring Physician: [name] Treating Physician/Extender: Frann Rider in Treatment: 56 Vital Signs Height(in): 65 Pulse(bpm): 90 Weight(lbs): 156 Blood Pressure 142/60 (mmHg): Body Mass Index(BMI): 26 Temperature(F): 98.4 Respiratory Rate 18 (breaths/min): Photos: [N/A:N/A] Wound  Location: Right Back N/A N/A Wounding Event: Blister N/A N/A Primary Etiology: Pressure Ulcer N/A N/A Comorbid History: Glaucoma, Anemia, N/A N/A Coronary Artery Disease, Hypertension, Type II Diabetes, End Stage Renal Disease, Osteoarthritis, Neuropathy Date Acquired: 10/19/2013 N/A N/A Weeks of Treatment: 56 N/A N/A Wound Status: Open N/A N/A Measurements L x W x D 2x3x0.1 N/A N/A (cm) Area (cm) : 4.712 N/A N/A Volume (cm) : 0.471 N/A N/A %  Reduction in Area: 87.80% N/A N/A % Reduction in Volume: 87.80% N/A N/A Classification: Category/Stage II N/A N/A Exudate Amount: Medium N/A N/A Exudate Type: Serosanguineous N/A N/A Exudate Color: red, brown N/A N/A Wound Margin: Distinct, outline attached N/A N/A Klett, Mariko S. ([id number]) Granulation Amount: Large (67-100%) N/A N/A Granulation Quality: Red, Friable N/A N/A Necrotic Amount: None Present (0%) N/A N/A Exposed Structures: Fascia: No N/A N/A Fat: No Tendon: No Muscle: No Joint: No Bone: No Limited to Skin Breakdown Epithelialization: Large (67-100%) N/A N/A Periwound Skin Texture: Friable: Yes N/A N/A Scarring: Yes Edema: No Excoriation: No Induration: No Callus: No Crepitus: No Fluctuance: No Rash: No Periwound Skin Moist: Yes N/A N/A Moisture: Maceration: No Dry/Scaly: No Periwound Skin Color: Atrophie Blanche: No N/A N/A Cyanosis: No Ecchymosis: No Erythema: No Hemosiderin Staining: No Mottled: No Pallor: No Rubor: No Temperature: No Abnormality N/A N/A Tenderness on No N/A N/A Palpation: Wound Preparation: Ulcer Cleansing: N/A N/A Rinsed/Irrigated with Saline Topical Anesthetic Applied: None Treatment Notes Electronic Signature(s) Signed: 01/22/2016 4:56:16 PM By: Gretta Cool, RN, [name], Kim RN, [name] Entered By: Gretta Cool, RN, [name], Kim on 01/22/2016 09:32:26 Hank, [name] ([id number]) -------------------------------------------------------------------------------- Multi-Disciplinary Care Plan  Details Patient Name: YANEISY, TAKANO. Date of Service: 01/22/2016 9:15 AM Medical Record Number: [id number] Patient Account Number: [id number] Date of Birth/Sex: 1955-08-13 (60 y.o. Female) Treating RN: Cornell Barman Primary Care Physician: [name] Other Clinician: Referring Physician: [name] Treating Physician/Extender: Frann Rider in Treatment: 20 Active Inactive Abuse / Safety / Falls / Self Care Management Nursing Diagnoses: Potential for falls Goals: Patient will remain injury free Date Initiated: 12/23/2014 Goal Status: Active Patient/caregiver will verbalize understanding of skin care regimen Date Initiated: 12/23/2014 Goal Status: Active Patient/caregiver will verbalize/demonstrate measures taken to prevent injury and/or falls Date Initiated: 12/23/2014 Goal Status: Active Patient/caregiver will verbalize/demonstrate understanding of what to do in case of emergency Date Initiated: 12/23/2014 Goal Status: Active Interventions: Assess fall risk on admission and as needed Assess: immobility, friction, shearing, incontinence upon admission and as needed Assess impairment of mobility on admission and as needed per policy Assess self care needs on admission and as needed Provide education on fall prevention Notes: Wound/Skin Impairment Nursing Diagnoses: Impaired tissue integrity Knowledge deficit related to ulceration/compromised skin integrity Goals: Patient/caregiver will verbalize understanding of skin care regimen ANDREANA, SIGUR ([id number]) Date Initiated: 12/23/2014 Goal Status: Active Ulcer/skin breakdown will heal within 14 weeks Date Initiated: 12/23/2014 Goal Status: Active Interventions: Assess patient/caregiver ability to obtain necessary supplies Assess patient/caregiver ability to perform ulcer/skin care regimen upon admission and as needed Assess ulceration(s) every visit Provide education on ulcer and skin care Treatment Activities: Skin care  regimen initiated : 11/05/2015 Topical wound management initiated : 11/05/2015 Notes: Electronic Signature(s) Signed: 01/22/2016 4:56:16 PM By: Gretta Cool, RN, [name], Kim RN, [name] Entered By: Gretta Cool, RN, [name], Kim on 01/22/2016 09:32:19 Wambold, [name] ([id number]) -------------------------------------------------------------------------------- Pain Assessment Details [name]. Date of Service: 01/22/2016 9:15 AM Medical Record Number: [id number] Patient Account Number: [id number] Date of Birth/Sex: 07-04-1956 (60 y.o. Female) Treating RN: Cornell Barman Primary Care Physician: [name] Other Clinician: Referring Physician: [name] Treating Physician/Extender: Frann Rider in Treatment: 56 Active Problems Location of Pain Severity and Description of Pain Patient Has Paino No Site Locations With Dressing Change: No Pain Management and Medication Current Pain Management: Electronic Signature(s) Signed: 01/22/2016 4:56:16 PM By: Gretta Cool, RN, [name], Kim RN, [name] Entered By: Gretta Cool, RN, [name], Kim on 01/22/2016 09:28:12 Ditton, [name] ([id number]) --------------------------------------------------------------------------------  Patient/Caregiver Education Details Patient Name: CHRISTIANNE, MCCARSON. Date of Service: 01/22/2016 9:15 AM Medical Record Number: [id number] Patient Account Number: [id number] Date of Birth/Gender: Nov 08, 1955 (60 y.o. Female) Treating RN: Cornell Barman Primary Care Physician: [name] Other Clinician: Referring Physician: [name] Treating Physician/Extender: Frann Rider in Treatment: 108 Education Assessment Education Provided To: Patient Education Topics Provided Wound/Skin Impairment: Handouts: Caring for Your Ulcer Methods: Demonstration, Explain/Verbal, Printed Responses: State content correctly Electronic Signature(s) Signed: 01/22/2016 4:56:16 PM By: Gretta Cool, RN, [name], Kim RN, [name] Entered By: Gretta Cool, RN, [name], Kim on 01/22/2016  09:34:56 Markoff, [name] ([id number]) -------------------------------------------------------------------------------- Wound Assessment Details Patient Name: LAZARA, BIENVENUE. Date of Service: 01/22/2016 9:15 AM Medical Record Number: [id number] Patient Account Number: [id number] Date of Birth/Sex: 11/22/1955 (60 y.o. Female) Treating RN: Cornell Barman Primary Care Physician: [name] Other Clinician: Referring Physician: [name] Treating Physician/Extender: Frann Rider in Treatment: 29 Wound Status Wound Number: 1 Primary Pressure Ulcer Etiology: Wound Location: Right Back Wound Open Wounding Event: Blister Status: Date Acquired: 10/19/2013 Comorbid Glaucoma, Anemia, Coronary Artery Weeks Of Treatment: 56 History: Disease, Hypertension, Type II Clustered Wound: No Diabetes, End Stage Renal Disease, Osteoarthritis, Neuropathy Photos Wound Measurements Length: (cm) 2 Width: (cm) 3 Depth: (cm) 0.1 Area: (cm) 4.712 Volume: (cm) 0.471 % Reduction in Area: 87.8% % Reduction in Volume: 87.8% Epithelialization: Large (67-100%) Tunneling: No Undermining: No Wound Description Classification: Category/Stage II Wound Margin: Distinct, outline attached Exudate Amount: Medium Exudate Type: Serosanguineous Exudate Color: red, brown Foul Odor After Cleansing: No Wound Bed Granulation Amount: Large (67-100%) Exposed Structure Granulation Quality: Red, Friable Fascia Exposed: No Necrotic Amount: None Present (0%) Fat Layer Exposed: No Tendon Exposed: No Whittaker, Alizandra S. ([id number]) Muscle Exposed: No Joint Exposed: No Bone Exposed: No Limited to Skin Breakdown Periwound Skin Texture Texture Color No Abnormalities Noted: No No Abnormalities Noted: No Callus: No Atrophie Blanche: No Crepitus: No Cyanosis: No Excoriation: No Ecchymosis: No Fluctuance: No Erythema: No Friable: Yes Hemosiderin Staining: No Induration: No Mottled: No Localized Edema:  No Pallor: No Rash: No Rubor: No Scarring: Yes Temperature / Pain Moisture Temperature: No Abnormality No Abnormalities Noted: No Dry / Scaly: No Maceration: No Moist: Yes Wound Preparation Ulcer Cleansing: Rinsed/Irrigated with Saline Topical Anesthetic Applied: None Treatment Notes Wound #1 (Right Back) 1. Cleansed with: Clean wound with Normal Saline 2. Anesthetic Topical Lidocaine 4% cream to wound bed prior to debridement 3. Peri-wound Care: Skin Prep 4. Dressing Applied: Other dressing (specify in notes) 5. Secondary Dressing Applied Bordered Foam Dressing Notes RTD Electronic Signature(s) Signed: 01/22/2016 4:56:16 PM By: Gretta Cool, RN, [name], Kim RN, [name] Entered By: Gretta Cool, RN, [name], Kim on 01/22/2016 09:29:42 Paulding, [name] ([id number]) Levick, [name] ([id number]) -------------------------------------------------------------------------------- Vitals Details Patient Name: LANICIA, GOEDKEN. Date of Service: 01/22/2016 9:15 AM Medical Record Number: [id number] Patient Account Number: [id number] Date of Birth/Sex: [birth year] (60 y.o. Female) Treating RN: Cornell Barman Primary Care Physician: [name] Other Clinician: Referring Physician: [name] Treating Physician/Extender: Frann Rider in Treatment: 52 Vital Signs Time Taken: 09:28 Temperature (F): 98.4 Height (in): 65 Pulse (bpm): 90 Weight (lbs): 156 Respiratory Rate (breaths/min): 18 Body Mass Index (BMI): 26 Blood Pressure (mmHg): 142/60 Reference Range: 80 - 120 mg / dl Electronic Signature(s) Signed: 01/22/2016 4:56:16 PM By: Gretta Cool, RN, [name], Kim RN, [name] Entered By: Gretta Cool, RN, [name], Kim on 01/22/2016 [id number]

## 2016-01-29 ENCOUNTER — Encounter (HOSPITAL_BASED_OUTPATIENT_CLINIC_OR_DEPARTMENT_OTHER): Payer: Medicare Other | Admitting: General Surgery

## 2016-01-29 DIAGNOSIS — L98421 Non-pressure chronic ulcer of back limited to breakdown of skin: Secondary | ICD-10-CM | POA: Insufficient documentation

## 2016-01-29 DIAGNOSIS — E11622 Type 2 diabetes mellitus with other skin ulcer: Secondary | ICD-10-CM | POA: Diagnosis not present

## 2016-01-29 DIAGNOSIS — L89102 Pressure ulcer of unspecified part of back, stage 2: Secondary | ICD-10-CM

## 2016-01-29 NOTE — Progress Notes (Signed)
Continue collagen to back ulcer 4 cm size. Perhaps  Apligraf is possible foe her

## 2016-01-30 NOTE — Progress Notes (Addendum)
YESLIE, HERPIN ([id number]) Visit Report for 01/29/2016 Chief Complaint Document Details [name], [name]. Date of Service: 01/29/2016 9:15 AM Medical Record Number: [id number] Patient Account Number: [id number] Date of Birth/Sex: 07-24-1955 (60 y.o. Female) Treating RN: [name] Primary Care Physician: [name] Other Clinician: Referring Physician: Nicky [name] Treating Physician/Extender: [name] in Treatment: 62 Information Obtained from: Patient Chief Complaint Patient presents to the wound care center for a consult due non healing wound. She has an open wound on her right upper back which she's had for about a year and she recently noticed a blister on her right lower extremity about 2 weeks ago. Electronic Signature(s) Signed: 01/29/2016 12:06:54 PM By: [name] Companion MD Entered By: [name] on 01/29/2016 12:06:54 [name], [name] ([id number]) -------------------------------------------------------------------------------- HPI Details [name], [name]. Date of Service: 01/29/2016 9:15 AM Medical Record Number: [id number] Patient Account Number: [id number] Date of Birth/Sex: 08/26/1955 (60 y.o. Female) Treating RN: [name] Primary Care Physician: [name] Other Clinician: Referring Physician: Nicky [name] Treating Physician/Extender: [name] in Treatment: 60 History of Present Illness Location: right upper back and right lower extremity wounds Quality: Patient reports No Pain. Severity: Patient states wound (s) are getting better. Duration: Patient has had the wound for > 3 months prior to seeking treatment at the wound center Timing: she thought it first occurred when she was using a heating pad about a year ago after back surgery. Context: The wound appeared gradually over time Modifying Factors: Patient is currently on renal dialysis and receives treatments 3 times weekly Associated Signs and Symptoms:  Patient reports having: surgery scheduled for this week for a AV fistula left arm. HPI Description: 60 year old patient who is known to be a diabetic and has end-stage renal disease has had several comorbidities including coronary artery disease, hypertension, hyperlipidemia, pancreatitis, anemia, previous history of hysterectomy, cholecystectomy, left-sided salivary gland excision, bilateral cataract surgery,Peritoneal dialysis catheter, hemodialysis catheter. the area on the back has also been caused by instant pressure she used to sleep on a recliner all day and has significant kyphoscoliosis. As far as the wound on her right lower extremity she's not sure how this blister occurred but she thought it has been there for about 2 weeks. No recent blood investigations available and no recent hemoglobin A1c. 12/30/2014 -- she is an assisted living facility but I believe the nurses that have not followed instructions as she had some cream applied on her back and there was a different dressing. Last week she's had a AV fistula placed on her left forearm. 01/13/2015 -- she has had some localized infection at the port site and she's been on doxycycline for this. 02/05/2015 - he has developed a small blister on her right lower extremity. 02/10/2015 -- she has developed another small blister on her right anterior chest wall in the area where she's had tape for her dialysis access. this may just be injury caused by a tape burn. 02/18/2015 -- no new blisters and she had a dermatology opinion and they have taken a biopsy of her skin. She also had a left brachial AV fistula placed this week. 03/03/2015 -- though we do not have the pathology report yet the patient says she has been put on prednisone because this skin disease is possibly to do with her immune system and her dermatologist is recommended this. 03/10/2015 -- she has developed a new blister which is quite large on her right lower extremity on  the shin. 05/22/2015 --  she did very well with her dressing but on trying to remove the [place] it peels away the newly healed skin. [name], [name] ([id number]) 06/05/2015 -- the patient was in the ER this past week for severe bleeding from the right nostril and had to have ENT see her and do a packing. They've also been holding her heparin during her dialysis. The patient is going back to ENT today. Other than that she has had no other significant issues. 06/19/2015 -- she is back on her blood thinners and continues to have her hemodialysis as before. 07/31/2015 -- a large blister has popped up again on her right lower extremity in the same place in the mid shin area in the anterior lateral compartment. 10/05/2015 -- last week she was admitted to the hospital for 4 days as she had a pneumonia and was treated with IV and oral antibiotics. Electronic Signature(s) Signed: 01/29/2016 12:07:05 PM By: [name] Companion MD Entered By: [name] on 01/29/2016 12:07:04 [name], [name] ([id number]) -------------------------------------------------------------------------------- Physical Exam Details [name]. Date of Service: 01/29/2016 9:15 AM Medical Record Number: [id number] Patient Account Number: [id number] Date of Birth/Sex: May 11, 1956 (60 y.o. Female) Treating RN: [name] Primary Care Physician: [name] Other Clinician: Referring Physician: Nicky [name] Treating Physician/Extender: [name] in Treatment: 31 Electronic Signature(s) Signed: 01/29/2016 12:07:12 PM By: [name] Companion MD Entered By: [name] on 01/29/2016 12:07:12 [name], [name] ([id number]) -------------------------------------------------------------------------------- Physician Orders Details [name]. Date of Service: 01/29/2016 9:15 AM Medical Record Number: [id number] Patient Account Number: [id number] Date of Birth/Sex: [birth year] (60 y.o.  Female) Treating RN: [name] Primary Care Physician: [name] Other Clinician: Referring Physician: Nicky [name] Treating Physician/Extender: [name] in Treatment: 29 Verbal / Phone Orders: Yes Clinician: Cornell [name] Read Back and Verified: Yes Diagnosis Coding Wound Cleansing Wound #1 Right Back o Clean wound with Normal Saline. Skin Barriers/Peri-Wound Care Wound #1 Right Back o Skin Prep Primary Wound Dressing Wound #1 Right Back o Prisma Ag Secondary Dressing Wound #1 Right Back o Boardered Foam Dressing - Remove slowly and carefully! o Non-adherent pad - under bordered foam Dressing Change Frequency Wound #1 Right Back o Other: - Change dressing on Monday Follow-up Appointments Wound #1 Right Back o Return Appointment in 1 week. Electronic Signature(s) Signed: 01/29/2016 4:56:28 PM By: Gretta Cool RN, [name], Kim RN, [name] Signed: 01/29/2016 4:57:40 PM By: [name] Companion MD Entered By: Gretta Cool, RN, [name], Kim on 01/29/2016 09:37:43 [name], [name] ([id number]) -------------------------------------------------------------------------------- Problem List Details [name], [name]. Date of Service: 01/29/2016 9:15 AM Medical Record Number: [id number] Patient Account Number: [id number] Date of Birth/Sex: [birth year] (60 y.o. 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 Date of Service: 01/29/2016 9:15 AM Medical Record Number: [id number] Patient Account Number: [id number] Date of Birth/Sex: 1956/03/29 (60 y.o. Female) Treating RN: [name] Primary Care Physician: [name] Other Clinician: Referring  Physician: Nicky [name] Treating Physician/Extender: [name] in Treatment: 63 Subjective Chief Complaint Information obtained from Patient Patient presents to the wound care center for a consult due non healing wound. She has an open wound on her right upper back which she's had for about a year and she recently noticed a blister on her right lower extremity about 2 weeks ago. History of Present Illness (HPI) The following HPI elements were documented for the patient's wound: Location: right upper back and right lower extremity wounds Quality: Patient reports No Pain. Severity: Patient states wound (s) are getting better. Duration: Patient has had the wound for > 3 months prior to seeking treatment at the wound center Timing: she thought it first occurred when she was using a heating pad about a year ago after back surgery. Context: The wound appeared gradually over time Modifying Factors: Patient is currently on renal dialysis and receives treatments 3 times weekly Associated Signs and Symptoms: Patient reports having: surgery scheduled for this week for a AV fistula left arm. 60 year old patient who is known to be a diabetic and has end-stage renal disease has had several comorbidities including coronary artery disease, hypertension, hyperlipidemia, pancreatitis, anemia, previous history of hysterectomy, cholecystectomy, left-sided salivary gland excision, bilateral cataract surgery,Peritoneal dialysis  catheter, hemodialysis catheter. the area on the back has also been caused by instant pressure she used to sleep on a recliner all day and has significant kyphoscoliosis. As far as the wound on her right lower extremity she's not sure how this blister occurred but she thought it has been there for about 2 weeks. No recent blood investigations available and no recent hemoglobin A1c. 12/30/2014 -- she is an assisted living facility but I believe the nurses that have not followed instructions as she had some cream applied on her back and there was a different dressing. Last week she's had a AV fistula placed on her left forearm. 01/13/2015 -- she has had some localized infection at the port site and she's been on doxycycline for this. 02/05/2015 - he has developed a small blister on her right lower extremity. 02/10/2015 -- she has developed another small blister on her right anterior chest wall in the area where she's had tape for her dialysis access. this may just be injury caused by a tape burn. 02/18/2015 -- no new blisters and she had a dermatology opinion and they have taken a biopsy of her skin. [name], [name] ([id number]) She also had a left brachial AV fistula placed this week. 03/03/2015 -- though we do not have the pathology report yet the patient says she has been put on prednisone because this skin disease is possibly to do with her immune system and her dermatologist is recommended this. 03/10/2015 -- she has developed a new blister which is quite large on her right lower extremity on the shin. 05/22/2015 -- she did very well with her dressing but on trying to remove the [place] it peels away the newly healed skin. 06/05/2015 -- the patient was in the ER this past week for severe bleeding from the right nostril and had to have ENT see her and do a packing. They've also been holding her heparin during her dialysis. The patient is going back to ENT today. Other than that she  has had no other significant issues. 06/19/2015 -- she is back on her blood thinners and continues to have her hemodialysis as before. 07/31/2015 -- a large blister has popped up again on her right lower  extremity in the same place in the mid shin area in the anterior lateral compartment. 10/05/2015 -- last week she was admitted to the hospital for 4 days as she had a pneumonia and was treated with IV and oral antibiotics. Objective Constitutional Vitals Time Taken: 9:17 AM, Height: 65 in, Weight: 156 lbs, BMI: 26, Temperature: 98.2 F, Pulse: 82 bpm, Respiratory Rate: 18 breaths/min, Blood Pressure: 140/55 mmHg. Integumentary (Hair, Skin) Wound #1 status is Open. Original cause of wound was Blister. The wound is located on the Right Back. The wound measures 2cm length x 2.5cm width x 0.1cm depth; 3.927cm^2 area and 0.393cm^3 volume. Pressure ulcer for 2 years on back. 4. cm size. May need apligraf. No better in 2 years. Will continue with collagen dressings Assessment Active Problems ICD-10 E11.622 - Type 2 diabetes mellitus with other skin ulcer L89.112 - Pressure ulcer of right upper back, stage 2 S80.821A - Blister (nonthermal), right lower leg, initial encounter Clontz, Gurtha S. ([id number]) N18.6 - End stage renal disease Plan Wound Cleansing: Wound #1 Right Back: Clean wound with Normal Saline. Skin Barriers/Peri-Wound Care: Wound #1 Right Back: Skin Prep Primary Wound Dressing: Wound #1 Right Back: Prisma Ag Secondary Dressing: Wound #1 Right Back: Boardered Foam Dressing - Remove slowly and carefully! Non-adherent pad - under bordered foam Dressing Change Frequency: Wound #1 Right Back: Other: - Change dressing on Monday Follow-up Appointments: Wound #1 Right Back: Return Appointment in 1 week. Follow-Up Appointments: A follow-up appointment should be scheduled. Medication Reconciliation completed and provided to Patient/Care Provider. A Patient Clinical Summary  of Care was provided to LD Electronic Signature(s) Signed: 02/05/2016 11:39:03 AM By: [name] Companion MD Previous Signature: 01/29/2016 12:10:11 PM Version By: [name] Companion MD Entered By: [name] on 02/05/2016 11:39:02 Starkel, [name] ([id number]) -------------------------------------------------------------------------------- SuperBill Details [name]. Date of Service: 01/29/2016 Medical Record Number: [id number] Patient Account Number: [id number] Date of Birth/Sex: [birth year] (60 y.o. Female) Treating RN: [name] Primary Care Physician: [name] Other Clinician: Referring Physician: Nicky [name] Treating Physician/Extender: [name] in Treatment: 57 Diagnosis Coding ICD-10 Codes Code Description E11.622 Type 2 diabetes mellitus with other skin ulcer L89.112 Pressure ulcer of right upper back, stage 2 S80.821A Blister (nonthermal), right lower leg, initial encounter N18.6 End stage renal disease Facility Procedures CPT4 Code: [id number] Description: [id number] - WOUND CARE VISIT-LEV 3 EST PT Modifier: Quantity: 1 Physician Procedures CPT4 Code: [id number] Description: [id number] - WC PHYS LEVEL 2 - EST PT ICD-10 Description Diagnosis E11.622 Type 2 diabetes mellitus with other skin ulcer L89.112 Pressure ulcer of right upper back, stage 2 Modifier: Quantity: 1 Electronic Signature(s) Signed: 01/29/2016 12:10:54 PM By: [name] Companion MD Entered By: [name] on 01/29/2016 12:10:54

## 2016-01-30 NOTE — Progress Notes (Signed)
[name] ([id number]) Visit Report for 01/29/2016 Arrival Information Details [name], [name]. Date of Service: 01/29/2016 9:15 AM Medical Record Number: [id number] Patient Account Number: [id number] Date of Birth/Sex: 03/11/1956 (60 y.o. Female) Treating RN: Cornell Barman Primary Care Physician: [name] Other Clinician: Referring Physician: [name] Treating Physician/Extender: Benjaman Pott in Treatment: 66 Visit Information History Since Last Visit Added or deleted any medications: No Patient Arrived: Walker Any new allergies or adverse reactions: No Arrival Time: 09:16 Had a fall or experienced change in No Accompanied By: self activities of daily living that may affect Transfer Assistance: None risk of falls: Patient Identification Verified: Yes Signs or symptoms of abuse/neglect since last No Secondary Verification Process Yes visito Completed: Hospitalized since last visit: No Patient Requires Transmission- No Has Dressing in Place as Prescribed: Yes Based Precautions: Pain Present Now: No Patient Has Alerts: Yes Patient Alerts: Patient on Blood Thinner Electronic Signature(s) Signed: 01/29/2016 4:56:28 PM By: Gretta Cool, RN, [name], Kim RN, [name] Entered By: Gretta Cool, RN, [name], Kim on 01/29/2016 09:17:21 Hightower, [name] ([id number]) -------------------------------------------------------------------------------- Clinic Level of Care Assessment Details [name]. Date of Service: 01/29/2016 9:15 AM Medical Record Number: [id number] Patient Account Number: [id number] Date of Birth/Sex: [birth year] (60 y.o. Female) Treating RN: Cornell Barman Primary Care Physician: [name] Other Clinician: Referring Physician: [name] Treating Physician/Extender: Benjaman Pott in Treatment: 21 Clinic Level of Care Assessment Items TOOL 4 Quantity Score []  - Use when only an EandM is performed on FOLLOW-UP visit 0 ASSESSMENTS -  Nursing Assessment / Reassessment X - Reassessment of Co-morbidities (includes updates in patient status) 1 10 []  - Reassessment of Adherence to Treatment Plan 0 ASSESSMENTS - Wound and Skin Assessment / Reassessment X - Simple Wound Assessment / Reassessment - one wound 1 5 []  - Complex Wound Assessment / Reassessment - multiple wounds 0 []  - Dermatologic / Skin Assessment (not related to wound area) 0 ASSESSMENTS - Focused Assessment []  - Circumferential Edema Measurements - multi extremities 0 []  - Nutritional Assessment / Counseling / Intervention 0 []  - Lower Extremity Assessment (monofilament, tuning fork, pulses) 0 []  - Peripheral Arterial Disease Assessment (using hand held doppler) 0 ASSESSMENTS - Ostomy and/or Continence Assessment and Care []  - Incontinence Assessment and Management 0 []  - Ostomy Care Assessment and Management (repouching, etc.) 0 PROCESS - Coordination of Care X - Simple Patient / Family Education for ongoing care 1 15 []  - Complex (extensive) Patient / Family Education for ongoing care 0 X - Staff obtains Programmer, systems, Records, Test Results / Process Orders 1 10 []  - Staff telephones HHA, Nursing Homes / Clarify orders / etc 0 []  - Routine Transfer to another Facility (non-emergent condition) 0 Henk, Windi S. ([id number]) []  - Routine Hospital Admission (non-emergent condition) 0 []  - New Admissions / Biomedical engineer / Ordering NPWT, Apligraf, etc. 0 []  - Emergency Hospital Admission (emergent condition) 0 X - Simple Discharge Coordination 1 10 []  - Complex (extensive) Discharge Coordination 0 PROCESS - Special Needs []  - Pediatric / Minor Patient Management 0 []  - Isolation Patient Management 0 []  - Hearing / Language / Visual special needs 0 []  - Assessment of Community assistance (transportation, D/C planning, etc.) 0 []  - Additional assistance / Altered mentation 0 []  - Support Surface(s) Assessment (bed, cushion, seat, etc.) 0 INTERVENTIONS -  Wound Cleansing / Measurement X - Simple Wound Cleansing - one wound 1 5 []  - Complex Wound Cleansing - multiple wounds 0 X -  Wound Imaging (photographs - any number of wounds) 1 5 []  - Wound Tracing (instead of photographs) 0 X - Simple Wound Measurement - one wound 1 5 []  - Complex Wound Measurement - multiple wounds 0 INTERVENTIONS - Wound Dressings X - Small Wound Dressing one or multiple wounds 1 10 []  - Medium Wound Dressing one or multiple wounds 0 []  - Large Wound Dressing one or multiple wounds 0 []  - Application of Medications - topical 0 []  - Application of Medications - injection 0 INTERVENTIONS - Miscellaneous []  - External ear exam 0 Clutter, Teala S. ([id number]) []  - Specimen Collection (cultures, biopsies, blood, body fluids, etc.) 0 []  - Specimen(s) / Culture(s) sent or taken to Lab for analysis 0 []  - Patient Transfer (multiple staff / Harrel Lemon Lift / Similar devices) 0 []  - Simple Staple / Suture removal (25 or less) 0 []  - Complex Staple / Suture removal (26 or more) 0 []  - Hypo / Hyperglycemic Management (close monitor of Blood Glucose) 0 []  - Ankle / Brachial Index (ABI) - do not check if billed separately 0 X - Vital Signs 1 5 Has the patient been seen at the hospital within the last three years: Yes Total Score: 80 Level Of Care: New/Established - Level 3 Electronic Signature(s) Signed: 01/29/2016 4:56:28 PM By: Gretta Cool, RN, [name], Kim RN, [name] Entered By: Gretta Cool, RN, [name], Kim on 01/29/2016 09:30:58 Chaput, [name] ([id number]) -------------------------------------------------------------------------------- Encounter Discharge Information Details [name]. Date of Service: 01/29/2016 9:15 AM Medical Record Number: [id number] Patient Account Number: [id number] Date of Birth/Sex: [birth year] (60 y.o. Female) Treating RN: Cornell Barman Primary Care Physician: [name] Other Clinician: Referring Physician: [name] Treating Physician/Extender:  Benjaman Pott in Treatment: 29 Encounter Discharge Information Items Discharge Pain Level: 0 Discharge Condition: Stable Ambulatory Status: Walker Discharge Destination: Nursing Home Transportation: Private Auto Accompanied By: self Schedule Follow-up Appointment: Yes Medication Reconciliation completed Yes and provided to Patient/Care Ruchama Kubicek: Provided on Clinical Summary of Care: 01/29/2016 Form Type Recipient Paper Patient LD Electronic Signature(s) Signed: 01/29/2016 12:11:22 PM By: Judene Companion MD Previous Signature: 01/29/2016 9:44:18 AM Version By: Ruthine Dose Entered By: Judene Companion on 01/29/2016 12:11:21 Russomanno, [name] ([id number]) -------------------------------------------------------------------------------- Lower Extremity Assessment Details Patient Name: Woodford, Lauralyn S. Date of Service: 01/29/2016 9:15 AM Medical Record Number: [id number] Patient Account Number: [id number] Date of Birth/Sex: January 31, 1956 (60 y.o. Female) Treating RN: Cornell Barman Primary Care Physician: [name] Other Clinician: Referring Physician: [name] Treating Physician/Extender: Benjaman Pott in Treatment: 70 Electronic Signature(s) Signed: 01/29/2016 4:56:28 PM By: Gretta Cool, RN, [name], Kim RN, [name] Entered By: Gretta Cool, RN, [name], Kim on 01/29/2016 09:18:29 Ellerby, [name] ([id number]) -------------------------------------------------------------------------------- Multi Wound Chart Details Patient Name: TAMURA, TARNOWSKI. Date of Service: 01/29/2016 9:15 AM Medical Record Number: [id number] Patient Account Number: [id number] Date of Birth/Sex: 25-Sep-1955 (60 y.o. Female) Treating RN: Cornell Barman Primary Care Physician: [name] Other Clinician: Referring Physician: [name] Treating Physician/Extender: Benjaman Pott in Treatment: 57 Vital Signs Height(in): 65 Pulse(bpm): 82 Weight(lbs): 156 Blood Pressure 140/55 (mmHg): Body Mass Index(BMI):  26 Temperature(F): 98.2 Respiratory Rate 18 (breaths/min): Photos: [1:No Photos] [N/A:N/A] Wound Location: [1:Right Back] [N/A:N/A] Wounding Event: [1:Blister] [N/A:N/A] Primary Etiology: [1:Pressure Ulcer] [N/A:N/A] Date Acquired: [1:10/19/2013] [N/A:N/A] Weeks of Treatment: [1:57] [N/A:N/A] Wound Status: [1:Open] [N/A:N/A] Measurements L x W x D 2x2.5x0.1 [N/A:N/A] (cm) Area (cm) : [1:3.927] [N/A:N/A] Volume (cm) : [1:0.393] [N/A:N/A] % Reduction in Area: [1:89.80%] [N/A:N/A] % Reduction in Volume: 89.80% [N/A:N/A] Classification: [1:Category/Stage II] [  N/A:N/A] Periwound Skin Texture: No Abnormalities Noted [N/A:N/A] Periwound Skin [1:No Abnormalities Noted] [N/A:N/A] Moisture: Periwound Skin Color: No Abnormalities Noted [N/A:N/A] Tenderness on [1:No] [N/A:N/A] Treatment Notes Electronic Signature(s) Signed: 01/29/2016 4:56:28 PM By: Gretta Cool, RN, [name], Kim RN, [name] Entered By: Gretta Cool, RN, [name], Kim on 01/29/2016 09:26:41 Castrillon, [name] ([id number]) -------------------------------------------------------------------------------- [address] Details Patient Name: RONICIA, WUETHRICH. Date of Service: 01/29/2016 9:15 AM Medical Record Number: [id number] Patient Account Number: [id number] Date of Birth/Sex: [birth year] (60 y.o. Female) Treating RN: Cornell Barman Primary Care Physician: [name] Other Clinician: Referring Physician: [name] Treating Physician/Extender: Benjaman Pott in Treatment: 43 Active Inactive Abuse / Safety / Falls / Self Care Management Nursing Diagnoses: Potential for falls Goals: Patient will remain injury free Date Initiated: 12/23/2014 Goal Status: Active Patient/caregiver will verbalize understanding of skin care regimen Date Initiated: 12/23/2014 Goal Status: Active Patient/caregiver will verbalize/demonstrate measures taken to prevent injury and/or falls Date Initiated: 12/23/2014 Goal Status: Active Patient/caregiver  will verbalize/demonstrate understanding of what to do in case of emergency Date Initiated: 12/23/2014 Goal Status: Active Interventions: Assess fall risk on admission and as needed Assess: immobility, friction, shearing, incontinence upon admission and as needed Assess impairment of mobility on admission and as needed per policy Assess self care needs on admission and as needed Provide education on fall prevention Notes: Wound/Skin Impairment Nursing Diagnoses: Impaired tissue integrity Knowledge deficit related to ulceration/compromised skin integrity Goals: Patient/caregiver will verbalize understanding of skin care regimen ALTAMESE, THROOP ([id number]) Date Initiated: 12/23/2014 Goal Status: Active Ulcer/skin breakdown will heal within 14 weeks Date Initiated: 12/23/2014 Goal Status: Active Interventions: Assess patient/caregiver ability to obtain necessary supplies Assess patient/caregiver ability to perform ulcer/skin care regimen upon admission and as needed Assess ulceration(s) every visit Provide education on ulcer and skin care Treatment Activities: Skin care regimen initiated : 11/05/2015 Topical wound management initiated : 11/05/2015 Notes: Electronic Signature(s) Signed: 01/29/2016 4:56:28 PM By: Gretta Cool, RN, [name], Kim RN, [name] Entered By: Gretta Cool, RN, [name], Kim on 01/29/2016 09:25:53 Saindon, [name] ([id number]) -------------------------------------------------------------------------------- Pain Assessment Details [name]. Date of Service: 01/29/2016 9:15 AM Medical Record Number: [id number] Patient Account Number: [id number] Date of Birth/Sex: 1955/09/07 (60 y.o. Female) Treating RN: Cornell Barman Primary Care Physician: [name] Other Clinician: Referring Physician: [name] Treating Physician/Extender: Benjaman Pott in Treatment: 57 Active Problems Location of Pain Severity and Description of Pain Patient Has Paino No Site  Locations With Dressing Change: No Pain Management and Medication Current Pain Management: Electronic Signature(s) Signed: 01/29/2016 4:56:28 PM By: Gretta Cool, RN, [name], Kim RN, [name] Entered By: Gretta Cool, RN, [name], Kim on 01/29/2016 09:17:39 Glassco, [name] ([id number]) -------------------------------------------------------------------------------- Patient/Caregiver Education Details [name]. Date of Service: 01/29/2016 9:15 AM Medical Record Number: [id number] Patient Account Number: [id number] Date of Birth/Gender: 1956/01/31 (60 y.o. Female) Treating RN: Cornell Barman Primary Care Physician: [name] Other Clinician: Referring Physician: [name] Treating Physician/Extender: Benjaman Pott in Treatment: 81 Education Assessment Education Provided To: Patient Education Topics Provided Wound/Skin Impairment: Handouts: Other: wound care as prescribed Methods: Demonstration Responses: State content correctly Electronic Signature(s) Signed: 01/29/2016 4:57:40 PM By: Judene Companion MD Entered By: Judene Companion on 01/29/2016 12:11:28 Mccauslin, [name] ([id number]) -------------------------------------------------------------------------------- Wound Assessment Details [name]. Date of Service: 01/29/2016 9:15 AM Medical Record Number: [id number] Patient Account Number: [id number] Date of Birth/Sex: [birth year] (60 y.o. Female) Treating RN: Cornell Barman Primary Care Physician: [name] Other Clinician: Referring Physician: [name] Treating Physician/Extender:  PARKER, PETER [name]: 1 Wound Status Wound Number: 1 Primary Etiology: Pressure Ulcer Wound Location: Right Back Wound Status: Open Wounding Event: Blister Date Acquired: 10/19/2013 Weeks Of Treatment: 57 Clustered Wound: No Photos Photo Uploaded By: Gretta Cool, RN, [name], Kim on 01/29/2016 11:38:53 Wound Measurements Length: (cm) 2 Width: (cm) 2.5 Depth: (cm)  0.1 Area: (cm) 3.927 Volume: (cm) 0.393 % Reduction in Area: 89.8% % Reduction in Volume: 89.8% Wound Description Classification: Category/Stage II Periwound Skin Texture Texture Color No Abnormalities Noted: No No Abnormalities Noted: No Moisture No Abnormalities Noted: No Treatment Notes Wound #1 (Right Back) 1. Cleansed with: Hagedorn, [name] ([id number]) Clean wound with Normal Saline 2. Anesthetic Topical Lidocaine 4% cream to wound bed prior to debridement 3. Peri-wound Care: Skin Prep 4. Dressing Applied: Other dressing (specify in notes) 5. Secondary Dressing Applied Bordered Foam Dressing Notes RTD Electronic Signature(s) Signed: 01/29/2016 4:56:28 PM By: Gretta Cool, RN, [name], Kim RN, [name] Entered By: Gretta Cool, RN, [name], Kim on 01/29/2016 09:22:18 Palomo, [name] ([id number]) -------------------------------------------------------------------------------- Vitals Details [name]. Date of Service: 01/29/2016 9:15 AM Medical Record Number: [id number] Patient Account Number: [id number] Date of Birth/Sex: [birth year] (60 y.o. Female) Treating RN: Cornell Barman Primary Care Physician: [name] Other Clinician: Referring Physician: [name] Treating Physician/Extender: Benjaman Pott in Treatment: 77 Vital Signs Time Taken: 09:17 Temperature (F): 98.2 Height (in): 65 Pulse (bpm): 82 Weight (lbs): 156 Respiratory Rate (breaths/min): 18 Body Mass Index (BMI): 26 Blood Pressure (mmHg): 140/55 Reference Range: 80 - 120 mg / dl Electronic Signature(s) Signed: 01/29/2016 4:56:28 PM By: Gretta Cool, RN, [name], Kim RN, [name] Entered By: Gretta Cool, RN, [name], Kim on 01/29/2016 [place]

## 2016-02-05 ENCOUNTER — Encounter (HOSPITAL_BASED_OUTPATIENT_CLINIC_OR_DEPARTMENT_OTHER): Payer: Medicare Other | Admitting: General Surgery

## 2016-02-05 ENCOUNTER — Encounter: Payer: Self-pay | Admitting: General Surgery

## 2016-02-05 DIAGNOSIS — E11622 Type 2 diabetes mellitus with other skin ulcer: Secondary | ICD-10-CM | POA: Diagnosis not present

## 2016-02-05 DIAGNOSIS — L89102 Pressure ulcer of unspecified part of back, stage 2: Secondary | ICD-10-CM | POA: Diagnosis not present

## 2016-02-05 DIAGNOSIS — L98421 Non-pressure chronic ulcer of back limited to breakdown of skin: Secondary | ICD-10-CM

## 2016-02-05 NOTE — Progress Notes (Signed)
Wound of back smaller.  Continue collagen  dressings

## 2016-02-06 NOTE — Progress Notes (Signed)
[name] ([id number]) Visit Report for 02/05/2016 Arrival Information Details [name]. Date of Service: 02/05/2016 9:15 AM Medical Record Number: [id number] Patient Account Number: [id number] Date of Birth/Sex: [birth year] (60 y.o. Female) Treating RN: Montey Hora Primary Care Physician: [name] Other Clinician: Referring Physician: [name] Treating Physician/Extender: Benjaman Pott in Treatment: 41 Visit Information History Since Last Visit Added or deleted any medications: No Patient Arrived: Walker Any new allergies or adverse reactions: No Arrival Time: 09:22 Had a fall or experienced change in No Accompanied By: self activities of daily living that may affect Transfer Assistance: None risk of falls: Patient Identification Verified: Yes Signs or symptoms of abuse/neglect since last No Secondary Verification Process Yes visito Completed: Hospitalized since last visit: No Patient Requires Transmission- No Pain Present Now: No Based Precautions: Patient Has Alerts: Yes Patient Alerts: Patient on Blood Thinner Electronic Signature(s) Signed: 02/05/2016 12:06:00 PM By: Montey Hora Entered By: Montey Hora on 02/05/2016 09:23:19 [name], [name] ([id number]) -------------------------------------------------------------------------------- Clinic Level of Care Assessment Details [name]. Date of Service: 02/05/2016 9:15 AM Medical Record Number: [id number] Patient Account Number: [id number] Date of Birth/Sex: February 14, 1956 (60 y.o. Female) Treating RN: Montey Hora Primary Care Physician: [name] Other Clinician: Referring Physician: [name] Treating Physician/Extender: Benjaman Pott in Treatment: 23 Clinic Level of Care Assessment Items TOOL 4 Quantity Score []  - Use when only an EandM is performed on FOLLOW-UP visit 0 ASSESSMENTS - Nursing Assessment / Reassessment X - Reassessment of  Co-morbidities (includes updates in patient status) 1 10 X - Reassessment of Adherence to Treatment Plan 1 5 ASSESSMENTS - Wound and Skin Assessment / Reassessment X - Simple Wound Assessment / Reassessment - one wound 1 5 []  - Complex Wound Assessment / Reassessment - multiple wounds 0 []  - Dermatologic / Skin Assessment (not related to wound area) 0 ASSESSMENTS - Focused Assessment []  - Circumferential Edema Measurements - multi extremities 0 []  - Nutritional Assessment / Counseling / Intervention 0 []  - Lower Extremity Assessment (monofilament, tuning fork, pulses) 0 []  - Peripheral Arterial Disease Assessment (using hand held doppler) 0 ASSESSMENTS - Ostomy and/or Continence Assessment and Care []  - Incontinence Assessment and Management 0 []  - Ostomy Care Assessment and Management (repouching, etc.) 0 PROCESS - Coordination of Care X - Simple Patient / Family Education for ongoing care 1 15 []  - Complex (extensive) Patient / Family Education for ongoing care 0 []  - Staff obtains Programmer, systems, Records, Test Results / Process Orders 0 []  - Staff telephones HHA, Nursing Homes / Clarify orders / etc 0 []  - Routine Transfer to another Facility (non-emergent condition) 0 [name], [name] ([id number]) []  - Routine Hospital Admission (non-emergent condition) 0 []  - New Admissions / Biomedical engineer / Ordering NPWT, Apligraf, etc. 0 []  - Emergency Hospital Admission (emergent condition) 0 X - Simple Discharge Coordination 1 10 []  - Complex (extensive) Discharge Coordination 0 PROCESS - Special Needs []  - Pediatric / Minor Patient Management 0 []  - Isolation Patient Management 0 []  - Hearing / Language / Visual special needs 0 []  - Assessment of Community assistance (transportation, D/C planning, etc.) 0 []  - Additional assistance / Altered mentation 0 []  - Support Surface(s) Assessment (bed, cushion, seat, etc.) 0 INTERVENTIONS - Wound Cleansing / Measurement X - Simple Wound  Cleansing - one wound 1 5 []  - Complex Wound Cleansing - multiple wounds 0 X - Wound Imaging (photographs - any number of wounds) 1 5 []  - Wound  Tracing (instead of photographs) 0 X - Simple Wound Measurement - one wound 1 5 []  - Complex Wound Measurement - multiple wounds 0 INTERVENTIONS - Wound Dressings X - Small Wound Dressing one or multiple wounds 1 10 []  - Medium Wound Dressing one or multiple wounds 0 []  - Large Wound Dressing one or multiple wounds 0 []  - Application of Medications - topical 0 []  - Application of Medications - injection 0 INTERVENTIONS - Miscellaneous []  - External ear exam 0 [name], [name] ([id number]) []  - Specimen Collection (cultures, biopsies, blood, body fluids, etc.) 0 []  - Specimen(s) / Culture(s) sent or taken to Lab for analysis 0 []  - Patient Transfer (multiple staff / Harrel Lemon Lift / Similar devices) 0 []  - Simple Staple / Suture removal (25 or less) 0 []  - Complex Staple / Suture removal (26 or more) 0 []  - Hypo / Hyperglycemic Management (close monitor of Blood Glucose) 0 []  - Ankle / Brachial Index (ABI) - do not check if billed separately 0 X - Vital Signs 1 5 Has the patient been seen at the hospital within the last three years: Yes Total Score: 75 Level Of Care: New/Established - Level 2 Electronic Signature(s) Signed: 02/05/2016 12:06:00 PM By: Montey Hora Entered By: Montey Hora on 02/05/2016 11:39:36 [name], [name] ([id number]) -------------------------------------------------------------------------------- Encounter Discharge Information Details [name]. Date of Service: 02/05/2016 9:15 AM Medical Record Number: [id number] Patient Account Number: [id number] Date of Birth/Sex: 07-Jul-1956 (60 y.o. Female) Treating RN: Montey Hora Primary Care Physician: [name] Other Clinician: Referring Physician: [name] Treating Physician/Extender: Benjaman Pott in Treatment: 60 Encounter Discharge  Information Items Discharge Pain Level: 0 Discharge Condition: Stable Ambulatory Status: Walker Discharge Destination: Nursing Home Transportation: Private Auto Accompanied By: self Schedule Follow-up Appointment: Yes Medication Reconciliation completed No and provided to Patient/Care Teriann Livingood: Provided on Clinical Summary of Care: 02/05/2016 Form Type Recipient Paper Patient LD Electronic Signature(s) Signed: 02/05/2016 9:51:58 AM By: Ruthine Dose Entered By: Ruthine Dose on 02/05/2016 09:51:57 [name], [name] ([id number]) -------------------------------------------------------------------------------- Multi Wound Chart Details [name]. Date of Service: 02/05/2016 9:15 AM Medical Record Number: [id number] Patient Account Number: [id number] Date of Birth/Sex: 10-10-1955 (60 y.o. Female) Treating RN: Montey Hora Primary Care Physician: [name] Other Clinician: Referring Physician: [name] Treating Physician/Extender: Benjaman Pott in Treatment: 58 Vital Signs Height(in): 65 Pulse(bpm): 83 Weight(lbs): 156 Blood Pressure 174/68 (mmHg): Body Mass Index(BMI): 26 Temperature(F): 98.1 Respiratory Rate 18 (breaths/min): Photos: [N/A:N/A] Wound Location: Right Back N/A N/A Wounding Event: Blister N/A N/A Primary Etiology: Pressure Ulcer N/A N/A Comorbid History: Glaucoma, Anemia, N/A N/A Coronary Artery Disease, Hypertension, Type II Diabetes, End Stage Renal Disease, Osteoarthritis, Neuropathy Date Acquired: 10/19/2013 N/A N/A Weeks of Treatment: 58 N/A N/A Wound Status: Open N/A N/A Measurements L x W x D 4x3.2x0.1 N/A N/A (cm) Area (cm) : 10.053 N/A N/A Volume (cm) : 1.005 N/A N/A % Reduction in Area: 73.90% N/A N/A % Reduction in Volume: 73.90% N/A N/A Classification: Category/Stage II N/A N/A Exudate Amount: Large N/A N/A Exudate Type: Sanguinous N/A N/A Exudate Color: red N/A N/A Wound Margin: Flat and Intact  N/A N/A [name], [name] ([id number]) Granulation Amount: Large (67-100%) N/A N/A Granulation Quality: Red N/A N/A Necrotic Amount: None Present (0%) N/A N/A Exposed Structures: Fascia: No N/A N/A Fat: No Tendon: No Muscle: No Joint: No Bone: No Limited to Skin Breakdown Epithelialization: Small (1-33%) N/A N/A Periwound Skin Texture: Edema: No N/A N/A Excoriation: No Induration:  No Callus: No Crepitus: No Fluctuance: No Friable: No Rash: No Scarring: No Periwound Skin Moist: Yes N/A N/A Moisture: Maceration: No Dry/Scaly: No Periwound Skin Color: Atrophie Blanche: No N/A N/A Cyanosis: No Ecchymosis: No Erythema: No Hemosiderin Staining: No Mottled: No Pallor: No Rubor: No Temperature: No Abnormality N/A N/A Tenderness on No N/A N/A Palpation: Wound Preparation: Ulcer Cleansing: N/A N/A Rinsed/Irrigated with Saline Topical Anesthetic Applied: None Treatment Notes Electronic Signature(s) Signed: 02/05/2016 12:06:00 PM By: Montey Hora Entered By: Montey Hora on 02/05/2016 09:31:24 [name], [name] ([id number]) -------------------------------------------------------------------------------- Latty Details [name]. Date of Service: 02/05/2016 9:15 AM Medical Record Number: [id number] Patient Account Number: [id number] Date of Birth/Sex: 07-07-1956 (60 y.o. Female) Treating RN: Montey Hora Primary Care Physician: [name] Other Clinician: Referring Physician: [name] Treating Physician/Extender: Benjaman Pott in Treatment: 55 Active Inactive Abuse / Safety / Falls / Self Care Management Nursing Diagnoses: Potential for falls Goals: Patient will remain injury free Date Initiated: 12/23/2014 Goal Status: Active Patient/caregiver will verbalize understanding of skin care regimen Date Initiated: 12/23/2014 Goal Status: Active Patient/caregiver will verbalize/demonstrate measures taken to prevent  injury and/or falls Date Initiated: 12/23/2014 Goal Status: Active Patient/caregiver will verbalize/demonstrate understanding of what to do in case of emergency Date Initiated: 12/23/2014 Goal Status: Active Interventions: Assess fall risk on admission and as needed Assess: immobility, friction, shearing, incontinence upon admission and as needed Assess impairment of mobility on admission and as needed per policy Assess self care needs on admission and as needed Provide education on fall prevention Notes: Wound/Skin Impairment Nursing Diagnoses: Impaired tissue integrity Knowledge deficit related to ulceration/compromised skin integrity Goals: Patient/caregiver will verbalize understanding of skin care regimen [name], [name] ([id number]) Date Initiated: 12/23/2014 Goal Status: Active Ulcer/skin breakdown will heal within 14 weeks Date Initiated: 12/23/2014 Goal Status: Active Interventions: Assess patient/caregiver ability to obtain necessary supplies Assess patient/caregiver ability to perform ulcer/skin care regimen upon admission and as needed Assess ulceration(s) every visit Provide education on ulcer and skin care Treatment Activities: Skin care regimen initiated : 11/05/2015 Topical wound management initiated : 11/05/2015 Notes: Electronic Signature(s) Signed: 02/05/2016 12:06:00 PM By: Montey Hora Entered By: Montey Hora on 02/05/2016 09:31:16 [name], [name] ([id number]) -------------------------------------------------------------------------------- Pain Assessment Details [name]. Date of Service: 02/05/2016 9:15 AM Medical Record Number: [id number] Patient Account Number: [id number] Date of Birth/Sex: [birth year] (60 y.o. Female) Treating RN: Montey Hora Primary Care Physician: [name] Other Clinician: Referring Physician: [name] Treating Physician/Extender: Benjaman Pott in Treatment: 58 Active Problems Location of Pain  Severity and Description of Pain Patient Has Paino No Site Locations Pain Management and Medication Current Pain Management: Notes Topical or injectable lidocaine is offered to patient for acute pain when surgical debridement is performed. If needed, Patient is instructed to use over the counter pain medication for the following 24-48 hours after debridement. Wound care MDs do not prescribed pain medications. Patient has chronic pain or uncontrolled pain. Patient has been instructed to make an appointment with their Primary Care Physician for pain management. Electronic Signature(s) Signed: 02/05/2016 12:06:00 PM By: Montey Hora Entered By: Montey Hora on 02/05/2016 09:23:33 [name], [name] ([id number]) -------------------------------------------------------------------------------- Patient/Caregiver Education Details [name]. Date of Service: 02/05/2016 9:15 AM Medical Record Number: [id number] Patient Account Number: [id number] Date of Birth/Gender: 1955/12/17 (60 y.o. Female) Treating RN: Montey Hora Primary Care Physician: [name] Other Clinician: Referring Physician: [name] Treating Physician/Extender: Benjaman Pott in Treatment: 63 Education  Assessment Education Provided To: Patient Education Topics Provided Wound/Skin Impairment: Handouts: Other: wound care as ordered Methods: Demonstration, Explain/Verbal Responses: State content correctly Electronic Signature(s) Signed: 02/05/2016 12:17:39 PM By: Judene Companion MD Entered By: Judene Companion on 02/05/2016 09:45:44 [name], [name] ([id number]) -------------------------------------------------------------------------------- Wound Assessment Details [name]. Date of Service: 02/05/2016 9:15 AM Medical Record Number: [id number] Patient Account Number: [id number] Date of Birth/Sex: [birth year] (60 y.o. Female) Treating RN: Montey Hora Primary Care Physician:  [name] Other Clinician: Referring Physician: [name] Treating Physician/Extender: Benjaman Pott in Treatment: 47 Wound Status Wound Number: 1 Primary Pressure Ulcer Etiology: Wound Location: Right Back Wound Open Wounding Event: Blister Status: Date Acquired: 10/19/2013 Comorbid Glaucoma, Anemia, Coronary Artery Weeks Of Treatment: 58 History: Disease, Hypertension, Type II Clustered Wound: No Diabetes, End Stage Renal Disease, Osteoarthritis, Neuropathy Photos Wound Measurements Length: (cm) 4 Width: (cm) 3.2 Depth: (cm) 0.1 Area: (cm) 10.053 Volume: (cm) 1.005 % Reduction in Area: 73.9% % Reduction in Volume: 73.9% Epithelialization: Small (1-33%) Tunneling: No Undermining: No Wound Description Classification: Category/Stage II Wound Margin: Flat and Intact Exudate Amount: Large Exudate Type: Sanguinous Exudate Color: red Foul Odor After Cleansing: No Wound Bed Granulation Amount: Large (67-100%) Exposed Structure Granulation Quality: Red Fascia Exposed: No Necrotic Amount: None Present (0%) Fat Layer Exposed: No Tendon Exposed: No Sapien, Naleah S. ([id number]) Muscle Exposed: No Joint Exposed: No Bone Exposed: No Limited to Skin Breakdown Periwound Skin Texture Texture Color No Abnormalities Noted: No No Abnormalities Noted: No Callus: No Atrophie Blanche: No Crepitus: No Cyanosis: No Excoriation: No Ecchymosis: No Fluctuance: No Erythema: No Friable: No Hemosiderin Staining: No Induration: No Mottled: No Localized Edema: No Pallor: No Rash: No Rubor: No Scarring: No Temperature / Pain Moisture Temperature: No Abnormality No Abnormalities Noted: No Dry / Scaly: No Maceration: No Moist: Yes Wound Preparation Ulcer Cleansing: Rinsed/Irrigated with Saline Topical Anesthetic Applied: None Treatment Notes Wound #1 (Right Back) 1. Cleansed with: Clean wound with Normal Saline 2. Anesthetic Topical Lidocaine 4% cream  to wound bed prior to debridement 3. Peri-wound Care: Skin Prep 4. Dressing Applied: Promogran 5. Secondary Dressing Applied Bordered Foam Dressing Non-Adherent pad Electronic Signature(s) Signed: 02/05/2016 12:06:00 PM By: Montey Hora Entered By: Montey Hora on 02/05/2016 09:30:49 Packett, [name] ([id number]) -------------------------------------------------------------------------------- Vitals Details [name]. Date of Service: 02/05/2016 9:15 AM Medical Record Number: [id number] Patient Account Number: [id number] Date of Birth/Sex: 1956-03-20 (60 y.o. Female) Treating RN: Montey Hora Primary Care Physician: [name] Other Clinician: Referring Physician: [name] Treating Physician/Extender: Benjaman Pott in Treatment: 35 Vital Signs Time Taken: 09:23 Temperature (F): 98.1 Height (in): 65 Pulse (bpm): 83 Weight (lbs): 156 Respiratory Rate (breaths/min): 18 Body Mass Index (BMI): 26 Blood Pressure (mmHg): 174/68 Reference Range: 80 - 120 mg / dl Electronic Signature(s) Signed: 02/05/2016 12:06:00 PM By: Montey Hora Entered By: Montey Hora on 02/05/2016 09:24:01

## 2016-02-06 NOTE — Progress Notes (Signed)
[name], [name] ([id number]) Visit Report for 02/05/2016 Chief Complaint Document Details [name]. Date of Service: 02/05/2016 9:15 AM Medical Record Number: [id number] Patient Account Number: [id number] Date of Birth/Sex: 1955/11/24 (60 y.o. Female) Treating RN: Montey Hora Primary Care Physician: [name] Other Clinician: Referring Physician: [name] Treating Physician/Extender: Benjaman Pott in Treatment: 29 Information Obtained from: Patient Chief Complaint Patient presents to the wound care center for a consult due non healing wound. She has an open wound on her right upper back which she's had for about a year and she recently noticed a blister on her right lower extremity about 2 weeks ago. Electronic Signature(s) Signed: 02/05/2016 12:17:39 PM By: Judene Companion MD Entered By: Judene Companion on 02/05/2016 09:42:22 [name] ([id number]) -------------------------------------------------------------------------------- HPI Details [name]. Date of Service: 02/05/2016 9:15 AM Medical Record Number: [id number] Patient Account Number: [id number] Date of Birth/Sex: Feb 17, 1956 (60 y.o. Female) Treating RN: Montey Hora Primary Care Physician: [name] Other Clinician: Referring Physician: [name] Treating Physician/Extender: Benjaman Pott in Treatment: 7 History of Present Illness Location: right upper back and right lower extremity wounds Quality: Patient reports No Pain. Severity: Patient states wound (s) are getting better. Duration: Patient has had the wound for > 3 months prior to seeking treatment at the wound center Timing: she thought it first occurred when she was using a heating pad about a year ago after back surgery. Context: The wound appeared gradually over time Modifying Factors: Patient is currently on renal dialysis and receives treatments 3 times weekly Associated Signs and  Symptoms: Patient reports having: surgery scheduled for this week for a AV fistula left arm. HPI Description: 60 year old patient who is known to be a diabetic and has end-stage renal disease has had several comorbidities including coronary artery disease, hypertension, hyperlipidemia, pancreatitis, anemia, previous history of hysterectomy, cholecystectomy, left-sided salivary gland excision, bilateral cataract surgery,Peritoneal dialysis catheter, hemodialysis catheter. the area on the back has also been caused by instant pressure she used to sleep on a recliner all day and has significant kyphoscoliosis. As far as the wound on her right lower extremity she's not sure how this blister occurred but she thought it has been there for about 2 weeks. No recent blood investigations available and no recent hemoglobin A1c. 12/30/2014 -- she is an assisted living facility but I believe the nurses that have not followed instructions as she had some cream applied on her back and there was a different dressing. Last week she's had a AV fistula placed on her left forearm. 01/13/2015 -- she has had some localized infection at the port site and she's been on doxycycline for this. 02/05/2015 - he has developed a small blister on her right lower extremity. 02/10/2015 -- she has developed another small blister on her right anterior chest wall in the area where she's had tape for her dialysis access. this may just be injury caused by a tape burn. 02/18/2015 -- no new blisters and she had a dermatology opinion and they have taken a biopsy of her skin. She also had a left brachial AV fistula placed this week. 03/03/2015 -- though we do not have the pathology report yet the patient says she has been put on prednisone because this skin disease is possibly to do with her immune system and her dermatologist is recommended this. 03/10/2015 -- she has developed a new blister which is quite large on her right lower  extremity on the shin. 05/22/2015 --  she did very well with her dressing but on trying to remove the [place] it peels away the newly healed skin. [name] ([id number]) 06/05/2015 -- the patient was in the ER this past week for severe bleeding from the right nostril and had to have ENT see her and do a packing. They've also been holding her heparin during her dialysis. The patient is going back to ENT today. Other than that she has had no other significant issues. 06/19/2015 -- she is back on her blood thinners and continues to have her hemodialysis as before. 07/31/2015 -- a large blister has popped up again on her right lower extremity in the same place in the mid shin area in the anterior lateral compartment. 10/05/2015 -- last week she was admitted to the hospital for 4 days as she had a pneumonia and was treated with IV and oral antibiotics. Electronic Signature(s) Signed: 02/05/2016 12:17:39 PM By: Judene Companion MD Entered By: Judene Companion on 02/05/2016 09:42:50 [name] ([id number]) -------------------------------------------------------------------------------- Physical Exam Details [name]. Date of Service: 02/05/2016 9:15 AM Medical Record Number: [id number] Patient Account Number: [id number] Date of Birth/Sex: April 02, 1956 (60 y.o. Female) Treating RN: Montey Hora Primary Care Physician: [name] Other Clinician: Referring Physician: [name] Treating Physician/Extender: Benjaman Pott in Treatment: 44 Electronic Signature(s) Signed: 02/05/2016 12:17:39 PM By: Judene Companion MD Entered By: Judene Companion on 02/05/2016 09:42:58 [name] ([id number]) -------------------------------------------------------------------------------- Physician Orders Details [name]. Date of Service: 02/05/2016 9:15 AM Medical Record Number: [id number] Patient Account Number: [id number] Date of Birth/Sex: February 23, 1956 (60  y.o. Female) Treating RN: Montey Hora Primary Care Physician: [name] Other Clinician: Referring Physician: [name] Treating Physician/Extender: Benjaman Pott in Treatment: 46 Verbal / Phone Orders: Yes Clinician: Montey Hora Read Back and Verified: Yes Diagnosis Coding Wound Cleansing Wound #1 Right Back o Clean wound with Normal Saline. o May Shower, gently pat wound dry prior to applying new dressing. - PATIENT MAY SHOWER AND [place] WOUND WITH SOAP AND WATER WITH CNA ASSISTANCE AND PAT WOUND DRY AND SNF NURSE TO APPLY NEW DRESSING RIGHT AFTERWARDS Skin Barriers/Peri-Wound Care Wound #1 Right Back o Skin Prep Primary Wound Dressing Wound #1 Right Back o Promogran Secondary Dressing Wound #1 Right Back o Boardered Foam Dressing - Remove slowly and carefully! o Non-adherent pad - under bordered foam - not ABD pad Dressing Change Frequency Wound #1 Right Back o Other: - Change dressing on Monday and as needed for soilage Follow-up Appointments Wound #1 Right Back o Return Appointment in 2 weeks. Electronic Signature(s) Signed: 02/05/2016 12:06:00 PM By: Montey Hora Signed: 02/05/2016 12:17:39 PM By: Judene Companion MD Entered By: Montey Hora on 02/05/2016 09:38:51 [name] ([id number]) -------------------------------------------------------------------------------- Problem List Details Patient Name: JOURDYN, BORKENHAGEN. Date of Service: 02/05/2016 9:15 AM Medical Record Number: [id number] Patient Account Number: [id number] Date of Birth/Sex: 1955-10-27 (60 y.o. Female) Treating RN: Montey Hora Primary Care Physician: [name] Other Clinician: Referring Physician: [name] Treating Physician/Extender: Benjaman Pott in Treatment: 30 Active Problems ICD-10 Encounter Code Description Active Date Diagnosis E11.622 Type 2 diabetes mellitus with other skin ulcer 12/23/2014 Yes L89.112 Pressure ulcer of right upper  back, stage 2 12/23/2014 Yes S80.821A Blister (nonthermal), right lower leg, initial encounter 12/23/2014 Yes N18.6 End stage renal disease 12/23/2014 Yes Inactive Problems Resolved Problems Electronic Signature(s) Signed: 02/05/2016 12:17:39 PM By: Judene Companion MD Entered By: Judene Companion on 02/05/2016 09:42:05 [name] ([id number]) --------------------------------------------------------------------------------  Progress Note Details [name]. Date of Service: 02/05/2016 9:15 AM Medical Record Number: [id number] Patient Account Number: [id number] Date of Birth/Sex: [birth year] (60 y.o. Female) Treating RN: Montey Hora Primary Care Physician: [name] Other Clinician: Referring Physician: [name] Treating Physician/Extender: Benjaman Pott in Treatment: 65 Subjective Chief Complaint Information obtained from Patient Patient presents to the wound care center for a consult due non healing wound. She has an open wound on her right upper back which she's had for about a year and she recently noticed a blister on her right lower extremity about 2 weeks ago. History of Present Illness (HPI) The following HPI elements were documented for the patient's wound: Location: right upper back and right lower extremity wounds Quality: Patient reports No Pain. Severity: Patient states wound (s) are getting better. Duration: Patient has had the wound for > 3 months prior to seeking treatment at the wound center Timing: she thought it first occurred when she was using a heating pad about a year ago after back surgery. Context: The wound appeared gradually over time Modifying Factors: Patient is currently on renal dialysis and receives treatments 3 times weekly Associated Signs and Symptoms: Patient reports having: surgery scheduled for this week for a AV fistula left arm. 60 year old patient who is known to be a diabetic and has end-stage renal disease has had  several comorbidities including coronary artery disease, hypertension, hyperlipidemia, pancreatitis, anemia, previous history of hysterectomy, cholecystectomy, left-sided salivary gland excision, bilateral cataract surgery,Peritoneal dialysis catheter, hemodialysis catheter. the area on the back has also been caused by instant pressure she used to sleep on a recliner all day and has significant kyphoscoliosis. As far as the wound on her right lower extremity she's not sure how this blister occurred but she thought it has been there for about 2 weeks. No recent blood investigations available and no recent hemoglobin A1c. 12/30/2014 -- she is an assisted living facility but I believe the nurses that have not followed instructions as she had some cream applied on her back and there was a different dressing. Last week she's had a AV fistula placed on her left forearm. 01/13/2015 -- she has had some localized infection at the port site and she's been on doxycycline for this. 02/05/2015 - he has developed a small blister on her right lower extremity. 02/10/2015 -- she has developed another small blister on her right anterior chest wall in the area where she's had tape for her dialysis access. this may just be injury caused by a tape burn. 02/18/2015 -- no new blisters and she had a dermatology opinion and they have taken a biopsy of her skin. [name] ([id number]) She also had a left brachial AV fistula placed this week. 03/03/2015 -- though we do not have the pathology report yet the patient says she has been put on prednisone because this skin disease is possibly to do with her immune system and her dermatologist is recommended this. 03/10/2015 -- she has developed a new blister which is quite large on her right lower extremity on the shin. 05/22/2015 -- she did very well with her dressing but on trying to remove the [place] it peels away the newly healed skin. 06/05/2015 -- the  patient was in the ER this past week for severe bleeding from the right nostril and had to have ENT see her and do a packing. They've also been holding her heparin during her dialysis. The patient is going back to ENT  today. Other than that she has had no other significant issues. 06/19/2015 -- she is back on her blood thinners and continues to have her hemodialysis as before. 07/31/2015 -- a large blister has popped up again on her right lower extremity in the same place in the mid shin area in the anterior lateral compartment. 10/05/2015 -- last week she was admitted to the hospital for 4 days as she had a pneumonia and was treated with IV and oral antibiotics. Objective Constitutional Vitals Time Taken: 9:23 AM, Height: 65 in, Weight: 156 lbs, BMI: 26, Temperature: 98.1 F, Pulse: 83 bpm, Respiratory Rate: 18 breaths/min, Blood Pressure: 174/68 mmHg. Integumentary (Hair, Skin) Wound #1 status is Open. Original cause of wound was Blister. The wound is located on the Right Back. The wound measures 4cm length x 3.2cm width x 0.1cm depth; 10.053cm^2 area and 1.005cm^3 volume. The wound is limited to skin breakdown. There is no tunneling or undermining noted. There is a large amount of sanguinous drainage noted. The wound margin is flat and intact. There is large (67-100%) red granulation within the wound bed. There is no necrotic tissue within the wound bed. The periwound skin appearance exhibited: Moist. The periwound skin appearance did not exhibit: Callus, Crepitus, Excoriation, Fluctuance, Friable, Induration, Localized Edema, Rash, Scarring, Dry/Scaly, Maceration, Atrophie Blanche, Cyanosis, Ecchymosis, Hemosiderin Staining, Mottled, Pallor, Rubor, Erythema. Periwound temperature was noted as No Abnormality. Wound very superficial 3x4 cm. Continue collagen dressings and washing daily Assessment [name] ([id number]) Active Problems ICD-10 E11.622 - Type 2 diabetes mellitus with  other skin ulcer L89.112 - Pressure ulcer of right upper back, stage 2 S80.821A - Blister (nonthermal), right lower leg, initial encounter N18.6 - End stage renal disease Plan Wound Cleansing: Wound #1 Right Back: Clean wound with Normal Saline. May Shower, gently pat wound dry prior to applying new dressing. - PATIENT MAY SHOWER AND [place] WITH SOAP AND WATER WITH CNA ASSISTANCE AND PAT WOUND DRY AND SNF NURSE TO APPLY NEW DRESSING RIGHT AFTERWARDS Skin Barriers/Peri-Wound Care: Wound #1 Right Back: Skin Prep Primary Wound Dressing: Wound #1 Right Back: Promogran Secondary Dressing: Wound #1 Right Back: Boardered Foam Dressing - Remove slowly and carefully! Non-adherent pad - under bordered foam - not ABD pad Dressing Change Frequency: Wound #1 Right Back: Other: - Change dressing on Monday and as needed for soilage Follow-up Appointments: Wound #1 Right Back: Return Appointment in 2 weeks. Follow-Up Appointments: A follow-up appointment should be scheduled. Electronic Signature(s) Signed: 02/05/2016 12:17:39 PM By: Judene Companion MD [name] ([id number]) Entered By: Judene Companion on 02/05/2016 09:44:30 Todorov, [name] ([id number]) -------------------------------------------------------------------------------- SuperBill Details Patient Name: JULYANNA, HEICHEL. Date of Service: 02/05/2016 Medical Record Number: [id number] Patient Account Number: [id number] Date of Birth/Sex: 02-27-1956 (60 y.o. Female) Treating RN: Montey Hora Primary Care Physician: [name] Other Clinician: Referring Physician: [name] Treating Physician/Extender: Benjaman Pott in Treatment: 58 Diagnosis Coding ICD-10 Codes Code Description E11.622 Type 2 diabetes mellitus with other skin ulcer L89.112 Pressure ulcer of right upper back, stage 2 S80.821A Blister (nonthermal), right lower leg, initial encounter N18.6 End stage renal disease Facility Procedures CPT4 Code:  [id number] Description: [id number] - WOUND CARE VISIT-LEV 2 EST PT Modifier: Quantity: 1 Physician Procedures CPT4 Code: [id number] Description: [id number] - WC PHYS LEVEL 2 - EST PT ICD-10 Description Diagnosis S80.821A Blister (nonthermal), right lower leg, initial enc Modifier: ounter Quantity: 1 Electronic Signature(s) Signed: 02/05/2016 11:39:44 AM By: Montey Hora Signed: 02/05/2016 12:17:39 PM By: Judene Companion MD  Entered By: Montey Hora on 02/05/2016 11:39:43

## 2016-02-19 ENCOUNTER — Encounter: Payer: Medicare Other | Attending: Surgery | Admitting: Surgery

## 2016-02-19 DIAGNOSIS — I2510 Atherosclerotic heart disease of native coronary artery without angina pectoris: Secondary | ICD-10-CM | POA: Insufficient documentation

## 2016-02-19 DIAGNOSIS — Z992 Dependence on renal dialysis: Secondary | ICD-10-CM | POA: Insufficient documentation

## 2016-02-19 DIAGNOSIS — L89112 Pressure ulcer of right upper back, stage 2: Secondary | ICD-10-CM | POA: Insufficient documentation

## 2016-02-19 DIAGNOSIS — X58XXXA Exposure to other specified factors, initial encounter: Secondary | ICD-10-CM | POA: Insufficient documentation

## 2016-02-19 DIAGNOSIS — D649 Anemia, unspecified: Secondary | ICD-10-CM | POA: Diagnosis not present

## 2016-02-19 DIAGNOSIS — E1122 Type 2 diabetes mellitus with diabetic chronic kidney disease: Secondary | ICD-10-CM | POA: Diagnosis not present

## 2016-02-19 DIAGNOSIS — N186 End stage renal disease: Secondary | ICD-10-CM | POA: Diagnosis not present

## 2016-02-19 DIAGNOSIS — I120 Hypertensive chronic kidney disease with stage 5 chronic kidney disease or end stage renal disease: Secondary | ICD-10-CM | POA: Diagnosis not present

## 2016-02-19 DIAGNOSIS — E785 Hyperlipidemia, unspecified: Secondary | ICD-10-CM | POA: Diagnosis not present

## 2016-02-19 DIAGNOSIS — E11622 Type 2 diabetes mellitus with other skin ulcer: Secondary | ICD-10-CM | POA: Diagnosis present

## 2016-02-19 DIAGNOSIS — S80821A Blister (nonthermal), right lower leg, initial encounter: Secondary | ICD-10-CM | POA: Diagnosis not present

## 2016-02-20 NOTE — Progress Notes (Signed)
MURRAY, VODA ([id number]) Visit Report for 02/19/2016 Chief Complaint Document Details [name], [name]. Date of Service: 02/19/2016 9:15 AM Medical Record Number: [id number] Patient Account Number: [id number] Date of Birth/Sex: [birth year] (60 y.o. Female) Treating RN: Cornell Barman Primary Care Physician: [name] Other Clinician: Referring Physician: [name] Treating Physician/Extender: Frann Rider in Treatment: 39 Information Obtained from: Patient Chief Complaint Patient presents to the wound care center for a consult due non healing wound. She has an open wound on her right upper back which she's had for about a year and she recently noticed a blister on her right lower extremity about 2 weeks ago. Electronic Signature(s) Signed: 02/19/2016 9:56:21 AM By: [name], [name] Entered By: [name] on 02/19/2016 09:56:21 Debarge, [name] ([id number]) -------------------------------------------------------------------------------- HPI Details [name]. Date of Service: 02/19/2016 9:15 AM Medical Record Number: [id number] Patient Account Number: [id number] Date of Birth/Sex: Dec 31, 1955 (60 y.o. Female) Treating RN: Cornell Barman Primary Care Physician: [name] Other Clinician: Referring Physician: [name] Treating Physician/Extender: Frann Rider in Treatment: 60 History of Present Illness Location: right upper back and right lower extremity wounds Quality: Patient reports No Pain. Severity: Patient states wound (s) are getting better. Duration: Patient has had the wound for > 3 months prior to seeking treatment at the wound center Timing: she thought it first occurred when she was using a heating pad about a year ago after back surgery. Context: The wound appeared gradually over time Modifying Factors: Patient is currently on renal dialysis and receives treatments 3 times weekly Associated Signs and Symptoms:  Patient reports having: surgery scheduled for this week for a AV fistula left arm. HPI Description: 60 year old patient who is known to be a diabetic and has end-stage renal disease has had several comorbidities including coronary artery disease, hypertension, hyperlipidemia, pancreatitis, anemia, previous history of hysterectomy, cholecystectomy, left-sided salivary gland excision, bilateral cataract surgery,Peritoneal dialysis catheter, hemodialysis catheter. the area on the back has also been caused by instant pressure she used to sleep on a recliner all day and has significant kyphoscoliosis. As far as the wound on her right lower extremity she's not sure how this blister occurred but she thought it has been there for about 2 weeks. No recent blood investigations available and no recent hemoglobin A1c. 12/30/2014 -- she is an assisted living facility but I believe the nurses that have not followed instructions as she had some cream applied on her back and there was a different dressing. Last week she's had a AV fistula placed on her left forearm. 01/13/2015 -- she has had some localized infection at the port site and she's been on doxycycline for this. 02/05/2015 - he has developed a small blister on her right lower extremity. 02/10/2015 -- she has developed another small blister on her right anterior chest wall in the area where she's had tape for her dialysis access. this may just be injury caused by a tape burn. 02/18/2015 -- no new blisters and she had a dermatology opinion and they have taken a biopsy of her skin. She also had a left brachial AV fistula placed this week. 03/03/2015 -- though we do not have the pathology report yet the patient says she has been put on prednisone because this skin disease is possibly to do with her immune system and her dermatologist is recommended this. 03/10/2015 -- she has developed a new blister which is quite large on her right lower extremity on  the shin.  05/22/2015 -- she did very well with her dressing but on trying to remove the [place] it peels away the newly healed skin. [name] ([id number]) 06/05/2015 -- the patient was in the ER this past week for severe bleeding from the right nostril and had to have ENT see her and do a packing. They've also been holding her heparin during her dialysis. The patient is going back to ENT today. Other than that she has had no other significant issues. 06/19/2015 -- she is back on her blood thinners and continues to have her hemodialysis as before. 07/31/2015 -- a large blister has popped up again on her right lower extremity in the same place in the mid shin area in the anterior lateral compartment. 10/05/2015 -- last week she was admitted to the hospital for 4 days as she had a pneumonia and was treated with IV and oral antibiotics. Electronic Signature(s) Signed: 02/19/2016 9:56:26 AM By: [name], [name] Entered By: [name] on 02/19/2016 09:56:26 Lentsch, [name] ([id number]) -------------------------------------------------------------------------------- Physical Exam Details [name], 36. Date of Service: 02/19/2016 9:15 AM Medical Record Number: [id number] Patient Account Number: [id number] Date of Birth/Sex: 02-01-1956 (60 y.o. Female) Treating RN: Cornell Barman Primary Care Physician: [name] Other Clinician: Referring Physician: [name] Treating Physician/Extender: Frann Rider in Treatment: 60 Constitutional . Pulse regular. Respirations normal and unlabored. Afebrile. . Eyes Nonicteric. Reactive to light. Ears, Nose, Mouth, and Throat Lips, teeth, and gums WNL.Marland Kitchen Moist mucosa without lesions. Neck supple and nontender. No palpable supraclavicular or cervical adenopathy. Normal sized without goiter. Respiratory WNL. No retractions.. Breath sounds WNL, No rubs, rales, rhonchi, or wheeze.. Cardiovascular Heart rhythm and  rate regular, no murmur or gallop.. Pedal Pulses WNL. No clubbing, cyanosis or edema. Lymphatic No adneopathy. No adenopathy. No adenopathy. Musculoskeletal Adexa without tenderness or enlargement.. Digits and nails w/o clubbing, cyanosis, infection, petechiae, ischemia, or inflammatory conditions.. Integumentary (Hair, Skin) No suspicious lesions. No crepitus or fluctuance. No peri-wound warmth or erythema. No masses.Marland Kitchen Psychiatric Judgement and insight Intact.. No evidence of depression, anxiety, or agitation.. Notes wound continues to be at the baseline with a few open areas and blisters and no debridement was required today. Electronic Signature(s) Signed: 02/19/2016 9:56:48 AM By: [name], [name] Entered By: [name] on 02/19/2016 09:56:48 Sukhu, [name] ([id number]) -------------------------------------------------------------------------------- Physician Orders Details Patient Name: [name] Date of Service: 02/19/2016 9:15 AM Medical Record Number: [id number] Patient Account Number: [id number] Date of Birth/Sex: 1956/05/06 (60 y.o. Female) Treating RN: Montey Hora Primary Care Physician: [name] Other Clinician: Referring Physician: [name] Treating Physician/Extender: Frann Rider in Treatment: 35 Verbal / Phone Orders: Yes Clinician: Montey Hora Read Back and Verified: Yes Diagnosis Coding Wound Cleansing Wound #1 Right Back o Clean wound with Normal Saline. o May Shower, gently pat wound dry prior to applying new dressing. - PATIENT MAY SHOWER AND [place] WOUND WITH SOAP AND WATER WITH CNA ASSISTANCE AND PAT WOUND DRY AND SNF NURSE TO APPLY NEW DRESSING RIGHT AFTERWARDS Skin Barriers/Peri-Wound Care Wound #1 Right Back o Skin Prep Primary Wound Dressing Wound #1 Right Back o RTD - PLEASE WET PRIOR TO REMOVING Secondary Dressing Wound #1 Right Back o Boardered Foam Dressing - Remove slowly and carefully! Dressing  Change Frequency Wound #1 Right Back o Change dressing every other day. Follow-up Appointments Wound #1 Right Back o Return Appointment in 1 week. Electronic Signature(s) Signed: 02/19/2016 4:14:16 PM By: [name], [name] Signed: 02/19/2016  4:47:48 PM By: Montey Hora Entered By: Montey Hora on 02/19/2016 09:26:05 Niblack, [name] ([id number]) -------------------------------------------------------------------------------- Problem List Details Patient Name: [id number]. Date of Service: 02/19/2016 9:15 AM Medical Record Number: [id number] Patient Account Number: [id number] Date of Birth/Sex: 1955-07-26 (60 y.o. Female) Treating RN: Cornell Barman Primary Care Physician: [name] Other Clinician: Referring Physician: [name] Treating Physician/Extender: Frann Rider in Treatment: 22 Active Problems ICD-10 Encounter Code Description Active Date Diagnosis E11.622 Type 2 diabetes mellitus with other skin ulcer 12/23/2014 Yes L89.112 Pressure ulcer of right upper back, stage 2 12/23/2014 Yes S80.821A Blister (nonthermal), right lower leg, initial encounter 12/23/2014 Yes N18.6 End stage renal disease 12/23/2014 Yes Inactive Problems Resolved Problems Electronic Signature(s) Signed: 02/19/2016 9:56:05 AM By: [name], [name] Entered By: [name] on 02/19/2016 09:56:05 Derenzo, [name] ([id number]) -------------------------------------------------------------------------------- Progress Note Details Patient Name: Santerre, Yasmene S. Date of Service: 02/19/2016 9:15 AM Medical Record Number: [id number] Patient Account Number: [id number] Date of Birth/Sex: 19-Sep-1955 (60 y.o. Female) Treating RN: Cornell Barman Primary Care Physician: [name] Other Clinician: Referring Physician: [name] Treating Physician/Extender: Frann Rider in Treatment: 20 Subjective Chief Complaint Information obtained from Patient Patient presents to the wound care  center for a consult due non healing wound. She has an open wound on her right upper back which she's had for about a year and she recently noticed a blister on her right lower extremity about 2 weeks ago. History of Present Illness (HPI) The following HPI elements were documented for the patient's wound: Location: right upper back and right lower extremity wounds Quality: Patient reports No Pain. Severity: Patient states wound (s) are getting better. Duration: Patient has had the wound for > 3 months prior to seeking treatment at the wound center Timing: she thought it first occurred when she was using a heating pad about a year ago after back surgery. Context: The wound appeared gradually over time Modifying Factors: Patient is currently on renal dialysis and receives treatments 3 times weekly Associated Signs and Symptoms: Patient reports having: surgery scheduled for this week for a AV fistula left arm. 60 year old patient who is known to be a diabetic and has end-stage renal disease has had several comorbidities including coronary artery disease, hypertension, hyperlipidemia, pancreatitis, anemia, previous history of hysterectomy, cholecystectomy, left-sided salivary gland excision, bilateral cataract surgery,Peritoneal dialysis catheter, hemodialysis catheter. the area on the back has also been caused by instant pressure she used to sleep on a recliner all day and has significant kyphoscoliosis. As far as the wound on her right lower extremity she's not sure how this blister occurred but she thought it has been there for about 2 weeks. No recent blood investigations available and no recent hemoglobin A1c. 12/30/2014 -- she is an assisted living facility but I believe the nurses that have not followed instructions as she had some cream applied on her back and there was a different dressing. Last week she's had a AV fistula placed on her left forearm. 01/13/2015 -- she has had some  localized infection at the port site and she's been on doxycycline for this. 02/05/2015 - he has developed a small blister on her right lower extremity. 02/10/2015 -- she has developed another small blister on her right anterior chest wall in the area where she's had tape for her dialysis access. this may just be injury caused by a tape burn. 02/18/2015 -- no new blisters and she had a dermatology opinion and they have taken a biopsy of  her skin. Tillison, [name] ([id number]) She also had a left brachial AV fistula placed this week. 03/03/2015 -- though we do not have the pathology report yet the patient says she has been put on prednisone because this skin disease is possibly to do with her immune system and her dermatologist is recommended this. 03/10/2015 -- she has developed a new blister which is quite large on her right lower extremity on the shin. 05/22/2015 -- she did very well with her dressing but on trying to remove the [place] it peels away the newly healed skin. 06/05/2015 -- the patient was in the ER this past week for severe bleeding from the right nostril and had to have ENT see her and do a packing. They've also been holding her heparin during her dialysis. The patient is going back to ENT today. Other than that she has had no other significant issues. 06/19/2015 -- she is back on her blood thinners and continues to have her hemodialysis as before. 07/31/2015 -- a large blister has popped up again on her right lower extremity in the same place in the mid shin area in the anterior lateral compartment. 10/05/2015 -- last week she was admitted to the hospital for 4 days as she had a pneumonia and was treated with IV and oral antibiotics. Objective Constitutional Pulse regular. Respirations normal and unlabored. Afebrile. Vitals Time Taken: 9:14 AM, Height: 65 in, Weight: 156 lbs, BMI: 26, Temperature: 98.3 F, Pulse: 87 bpm, Respiratory Rate: 20 breaths/min, Blood  Pressure: 153/62 mmHg. Eyes Nonicteric. Reactive to light. Ears, Nose, Mouth, and Throat Lips, teeth, and gums WNL.Marland Kitchen Moist mucosa without lesions. Neck supple and nontender. No palpable supraclavicular or cervical adenopathy. Normal sized without goiter. Respiratory WNL. No retractions.. Breath sounds WNL, No rubs, rales, rhonchi, or wheeze.. Cardiovascular Heart rhythm and rate regular, no murmur or gallop.. Pedal Pulses WNL. No clubbing, cyanosis or edema. Lymphatic No adneopathy. No adenopathy. No adenopathy. [name] ([id number]) Musculoskeletal Adexa without tenderness or enlargement.. Digits and nails w/o clubbing, cyanosis, infection, petechiae, ischemia, or inflammatory conditions.Marland Kitchen Psychiatric Judgement and insight Intact.. No evidence of depression, anxiety, or agitation.. General Notes: wound continues to be at the baseline with a few open areas and blisters and no debridement was required today. Integumentary (Hair, Skin) No suspicious lesions. No crepitus or fluctuance. No peri-wound warmth or erythema. No masses.. Wound #1 status is Open. Original cause of wound was Blister. The wound is located on the Right Back. The wound measures 2.8cm length x 5cm width x 0.1cm depth; 10.996cm^2 area and 1.1cm^3 volume. The wound is limited to skin breakdown. There is a large amount of sanguinous drainage noted. The wound margin is flat and intact. There is large (67-100%) red granulation within the wound bed. There is no necrotic tissue within the wound bed. The periwound skin appearance exhibited: Moist. The periwound skin appearance did not exhibit: Callus, Crepitus, Excoriation, Fluctuance, Friable, Induration, Localized Edema, Rash, Scarring, Dry/Scaly, Maceration, Atrophie Blanche, Cyanosis, Ecchymosis, Hemosiderin Staining, Mottled, Pallor, Rubor, Erythema. Periwound temperature was noted as No Abnormality. Assessment Active Problems ICD-10 E11.622 - Type 2 diabetes  mellitus with other skin ulcer L89.112 - Pressure ulcer of right upper back, stage 2 S80.821A - Blister (nonthermal), right lower leg, initial encounter N18.6 - End stage renal disease Plan Wound Cleansing: Wound #1 Right Back: Clean wound with Normal Saline. May Shower, gently pat wound dry prior to applying new dressing. - PATIENT MAY SHOWER AND [place] AND WATER  WITH CNA ASSISTANCE AND PAT WOUND DRY AND SNF NURSE TO APPLY NEW DRESSING RIGHT AFTERWARDS GABRIAL, [place] ([id number]) Skin Barriers/Peri-Wound Care: Wound #1 Right Back: Skin Prep Primary Wound Dressing: Wound #1 Right Back: RTD - PLEASE WET PRIOR TO REMOVING Secondary Dressing: Wound #1 Right Back: Boardered Foam Dressing - Remove slowly and carefully! Dressing Change Frequency: Wound #1 Right Back: Change dressing every other day. Follow-up Appointments: Wound #1 Right Back: Return Appointment in 1 week. We will use RTD foam dressing over this and change it as required. She will come back to see me next week Electronic Signature(s) Signed: 02/19/2016 9:57:17 AM By: [name], [name] Entered By: [name] on 02/19/2016 09:57:17 Neidert, [name] ([id number]) -------------------------------------------------------------------------------- SuperBill Details Patient Name: [name] Date of Service: 02/19/2016 Medical Record Number: [id number] Patient Account Number: [id number] Date of Birth/Sex: [birth year] (60 y.o. Female) Treating RN: Cornell Barman Primary Care Physician: [name] Other Clinician: Referring Physician: [name] Treating Physician/Extender: Frann Rider in Treatment: 60 Diagnosis Coding ICD-10 Codes Code Description E11.622 Type 2 diabetes mellitus with other skin ulcer L89.112 Pressure ulcer of right upper back, stage 2 S80.821A Blister (nonthermal), right lower leg, initial encounter N18.6 End stage renal disease Facility Procedures CPT4 Code:  [id number] Description: [phone] - WOUND CARE VISIT-LEV 2 EST PT Modifier: Quantity: 1 Physician Procedures CPT4 Code: [id number] Description: [id number] - WC PHYS LEVEL 3 - EST PT ICD-10 Description Diagnosis E11.622 Type 2 diabetes mellitus with other skin ulcer S80.821A Blister (nonthermal), right lower leg, initial enc L89.112 Pressure ulcer of right upper back, stage 2 N18.6 End  stage renal disease Modifier: ounter Quantity: 1 Electronic Signature(s) Signed: 02/19/2016 9:57:29 AM By: [name], [name] Entered By: [name] on 02/19/2016 09:57:29

## 2016-02-20 NOTE — Progress Notes (Signed)
GREICY, GIFFEN ([id number]) Visit Report for 02/19/2016 Arrival Information Details [name]. Date of Service: 02/19/2016 9:15 AM Medical Record Number: [id number] Patient Account Number: [id number] Date of Birth/Sex: [birth year] (60 y.o. Female) Treating RN: Cornell Barman Primary Care Physician: [name] Other Clinician: Referring Physician: [name] Treating Physician/Extender: Frann Rider in Treatment: 50 Visit Information History Since Last Visit Added or deleted any medications: No Patient Arrived: Ambulatory Any new allergies or adverse reactions: No Arrival Time: 09:13 Had a fall or experienced change in No Accompanied By: self activities of daily living that may affect Transfer Assistance: None risk of falls: Patient Identification Verified: Yes Hospitalized since last visit: No Secondary Verification Process Yes Has Dressing in Place as Prescribed: Yes Completed: Pain Present Now: No Patient Requires Transmission- No Based Precautions: Patient Has Alerts: Yes Patient Alerts: Patient on Blood Thinner Electronic Signature(s) Signed: 02/19/2016 4:16:06 PM By: Gretta Cool, RN, [name], Kim RN, [name] Entered By: Gretta Cool, RN, [name], Kim on 02/19/2016 09:14:15 [name] ([id number]) -------------------------------------------------------------------------------- Clinic Level of Care Assessment Details [name]. Date of Service: 02/19/2016 9:15 AM Medical Record Number: [id number] Patient Account Number: [id number] Date of Birth/Sex: 27-Feb-1956 (60 y.o. Female) Treating RN: Montey Hora Primary Care Physician: [name] Other Clinician: Referring Physician: [name] Treating Physician/Extender: Frann Rider in Treatment: 47 Clinic Level of Care Assessment Items TOOL 4 Quantity Score []  - Use when only an EandM is performed on FOLLOW-UP visit 0 ASSESSMENTS - Nursing Assessment / Reassessment X - Reassessment of  Co-morbidities (includes updates in patient status) 1 10 X - Reassessment of Adherence to Treatment Plan 1 5 ASSESSMENTS - Wound and Skin Assessment / Reassessment X - Simple Wound Assessment / Reassessment - one wound 1 5 []  - Complex Wound Assessment / Reassessment - multiple wounds 0 []  - Dermatologic / Skin Assessment (not related to wound area) 0 ASSESSMENTS - Focused Assessment []  - Circumferential Edema Measurements - multi extremities 0 []  - Nutritional Assessment / Counseling / Intervention 0 []  - Lower Extremity Assessment (monofilament, tuning fork, pulses) 0 []  - Peripheral Arterial Disease Assessment (using hand held doppler) 0 ASSESSMENTS - Ostomy and/or Continence Assessment and Care []  - Incontinence Assessment and Management 0 []  - Ostomy Care Assessment and Management (repouching, etc.) 0 PROCESS - Coordination of Care X - Simple Patient / Family Education for ongoing care 1 15 []  - Complex (extensive) Patient / Family Education for ongoing care 0 []  - Staff obtains Programmer, systems, Records, Test Results / Process Orders 0 []  - Staff telephones HHA, Nursing Homes / Clarify orders / etc 0 []  - Routine Transfer to another Facility (non-emergent condition) 0 [name], [name] ([id number]) []  - Routine Hospital Admission (non-emergent condition) 0 []  - New Admissions / Biomedical engineer / Ordering NPWT, Apligraf, etc. 0 []  - Emergency Hospital Admission (emergent condition) 0 X - Simple Discharge Coordination 1 10 []  - Complex (extensive) Discharge Coordination 0 PROCESS - Special Needs []  - Pediatric / Minor Patient Management 0 []  - Isolation Patient Management 0 []  - Hearing / Language / Visual special needs 0 []  - Assessment of Community assistance (transportation, D/C planning, etc.) 0 []  - Additional assistance / Altered mentation 0 []  - Support Surface(s) Assessment (bed, cushion, seat, etc.) 0 INTERVENTIONS - Wound Cleansing / Measurement X - Simple Wound  Cleansing - one wound 1 5 []  - Complex Wound Cleansing - multiple wounds 0 X - Wound Imaging (photographs - any number of wounds) 1  5 []  - Wound Tracing (instead of photographs) 0 X - Simple Wound Measurement - one wound 1 5 []  - Complex Wound Measurement - multiple wounds 0 INTERVENTIONS - Wound Dressings X - Small Wound Dressing one or multiple wounds 1 10 []  - Medium Wound Dressing one or multiple wounds 0 []  - Large Wound Dressing one or multiple wounds 0 []  - Application of Medications - topical 0 []  - Application of Medications - injection 0 INTERVENTIONS - Miscellaneous []  - External ear exam 0 [name], [name] ([id number]) []  - Specimen Collection (cultures, biopsies, blood, body fluids, etc.) 0 []  - Specimen(s) / Culture(s) sent or taken to Lab for analysis 0 []  - Patient Transfer (multiple staff / Harrel Lemon Lift / Similar devices) 0 []  - Simple Staple / Suture removal (25 or less) 0 []  - Complex Staple / Suture removal (26 or more) 0 []  - Hypo / Hyperglycemic Management (close monitor of Blood Glucose) 0 []  - Ankle / Brachial Index (ABI) - do not check if billed separately 0 X - Vital Signs 1 5 Has the patient been seen at the hospital within the last three years: Yes Total Score: 75 Level Of Care: New/Established - Level 2 Electronic Signature(s) Signed: 02/19/2016 4:47:48 PM By: Montey Hora Entered By: Montey Hora on 02/19/2016 09:51:33 [name], [name] ([id number]) -------------------------------------------------------------------------------- Encounter Discharge Information Details [name], [name] Date of Service: 02/19/2016 9:15 AM Medical Record Number: [id number] Patient Account Number: [id number] Date of Birth/Sex: 07-Sep-1955 (60 y.o. Female) Treating RN: Cornell Barman Primary Care Physician: [name] Other Clinician: Referring Physician: [name] Treating Physician/Extender: Frann Rider in Treatment: 68 Encounter Discharge Information  Items Discharge Pain Level: 0 Discharge Condition: Stable Ambulatory Status: Walker Discharge Destination: Home Transportation: Private Auto Accompanied By: self Schedule Follow-up Appointment: Yes Medication Reconciliation completed Yes and provided to Patient/Care [name]: Provided on Clinical Summary of Care: 02/19/2016 Form Type Recipient Paper Patient LD Electronic Signature(s) Signed: 02/19/2016 9:32:03 AM By: Ruthine Dose Entered By: Ruthine Dose on 02/19/2016 09:32:03 Scallan, [name] ([id number]) -------------------------------------------------------------------------------- Multi Wound Chart Details [name], [name] Date of Service: 02/19/2016 9:15 AM Medical Record Number: [id number] Patient Account Number: [id number] Date of Birth/Sex: 12-18-1955 (60 y.o. Female) Treating RN: Montey Hora Primary Care Physician: [name] Other Clinician: Referring Physician: [name] Treating Physician/Extender: Frann Rider in Treatment: 60 Vital Signs Height(in): 65 Pulse(bpm): 87 Weight(lbs): 156 Blood Pressure 153/62 (mmHg): Body Mass Index(BMI): 26 Temperature(F): 98.3 Respiratory Rate 20 (breaths/min): Photos: [N/A:N/A] Wound Location: Right Back N/A N/A Wounding Event: Blister N/A N/A Primary Etiology: Pressure Ulcer N/A N/A Comorbid History: Glaucoma, Anemia, N/A N/A Coronary Artery Disease, Hypertension, Type II Diabetes, End Stage Renal Disease, Osteoarthritis, Neuropathy Date Acquired: 10/19/2013 N/A N/A Weeks of Treatment: 60 N/A N/A Wound Status: Open N/A N/A Measurements L x W x D 2.8x5x0.1 N/A N/A (cm) Area (cm) : 10.996 N/A N/A Volume (cm) : 1.1 N/A N/A % Reduction in Area: 71.40% N/A N/A % Reduction in Volume: 71.40% N/A N/A Classification: Category/Stage II N/A N/A Exudate Amount: Large N/A N/A Exudate Type: Sanguinous N/A N/A Exudate Color: red N/A N/A Wound Margin: Flat and Intact N/A N/A Fetsch, [name]  ([id number]) Granulation Amount: Large (67-100%) N/A N/A Granulation Quality: Red N/A N/A Necrotic Amount: None Present (0%) N/A N/A Exposed Structures: Fascia: No N/A N/A Fat: No Tendon: No Muscle: No Joint: No Bone: No Limited to Skin Breakdown Epithelialization: Small (1-33%) N/A N/A Periwound Skin Texture: Edema: No N/A N/A  Excoriation: No Induration: No Callus: No Crepitus: No Fluctuance: No Friable: No Rash: No Scarring: No Periwound Skin Moist: Yes N/A N/A Moisture: Maceration: No Dry/Scaly: No Periwound Skin Color: Atrophie Blanche: No N/A N/A Cyanosis: No Ecchymosis: No Erythema: No Hemosiderin Staining: No Mottled: No Pallor: No Rubor: No Temperature: No Abnormality N/A N/A Tenderness on No N/A N/A Palpation: Wound Preparation: Ulcer Cleansing: N/A N/A Rinsed/Irrigated with Saline Topical Anesthetic Applied: None Treatment Notes Electronic Signature(s) Signed: 02/19/2016 4:47:48 PM By: Montey Hora Entered By: Montey Hora on 02/19/2016 09:24:49 [name], [name] ([id number]) -------------------------------------------------------------------------------- [place] Details [name]. Date of Service: 02/19/2016 9:15 AM Medical Record Number: [id number] Patient Account Number: [id number] Date of Birth/Sex: 03/17/1956 (60 y.o. Female) Treating RN: Montey Hora Primary Care Physician: [name] Other Clinician: Referring Physician: [name] Treating Physician/Extender: Frann Rider in Treatment: 44 Active Inactive Abuse / Safety / Falls / Self Care Management Nursing Diagnoses: Potential for falls Goals: Patient will remain injury free Date Initiated: 12/23/2014 Goal Status: Active Patient/caregiver will verbalize understanding of skin care regimen Date Initiated: 12/23/2014 Goal Status: Active Patient/caregiver will verbalize/demonstrate measures taken to prevent injury and/or falls Date  Initiated: 12/23/2014 Goal Status: Active Patient/caregiver will verbalize/demonstrate understanding of what to do in case of emergency Date Initiated: 12/23/2014 Goal Status: Active Interventions: Assess fall risk on admission and as needed Assess: immobility, friction, shearing, incontinence upon admission and as needed Assess impairment of mobility on admission and as needed per policy Assess self care needs on admission and as needed Provide education on fall prevention Notes: Wound/Skin Impairment Nursing Diagnoses: Impaired tissue integrity Knowledge deficit related to ulceration/compromised skin integrity Goals: Patient/caregiver will verbalize understanding of skin care regimen [name], [name] ([id number]) Date Initiated: 12/23/2014 Goal Status: Active Ulcer/skin breakdown will heal within 14 weeks Date Initiated: 12/23/2014 Goal Status: Active Interventions: Assess patient/caregiver ability to obtain necessary supplies Assess patient/caregiver ability to perform ulcer/skin care regimen upon admission and as needed Assess ulceration(s) every visit Provide education on ulcer and skin care Treatment Activities: Skin care regimen initiated : 11/05/2015 Topical wound management initiated : 11/05/2015 Notes: Electronic Signature(s) Signed: 02/19/2016 4:47:48 PM By: Montey Hora Entered By: Montey Hora on 02/19/2016 09:24:29 [name], [name] ([id number]) -------------------------------------------------------------------------------- Pain Assessment Details [name]. Date of Service: 02/19/2016 9:15 AM Medical Record Number: [id number] Patient Account Number: [id number] Date of Birth/Sex: Apr 23, 1956 (60 y.o. Female) Treating RN: Cornell Barman Primary Care Physician: [name] Other Clinician: Referring Physician: [name] Treating Physician/Extender: Frann Rider in Treatment: 60 Active Problems Location of Pain Severity and Description of  Pain Patient Has Paino No Site Locations Pain Management and Medication Current Pain Management: Electronic Signature(s) Signed: 02/19/2016 4:16:06 PM By: Gretta Cool, RN, [name], Kim RN, [name] Entered By: Gretta Cool, RN, [name], Kim on 02/19/2016 09:14:24 [name], [name] ([id number]) -------------------------------------------------------------------------------- Patient/Caregiver Education Details [name]. Date of Service: 02/19/2016 9:15 AM Medical Record Number: [id number] Patient Account Number: [id number] Date of Birth/Gender: 10/07/1955 (60 y.o. Female) Treating RN: Cornell Barman Primary Care Physician: [name] Other Clinician: Referring Physician: [name] Treating Physician/Extender: Frann Rider in Treatment: 86 Education Assessment Education Provided To: Patient Education Topics Provided Wound/Skin Impairment: Handouts: Caring for Your Ulcer Methods: Demonstration Responses: State content correctly Electronic Signature(s) Signed: 02/19/2016 4:16:06 PM By: Gretta Cool, RN, [name], Kim RN, [name] Entered By: Gretta Cool, RN, [name], Kim on 02/19/2016 09:31:39 [place], [name] ([id number]) -------------------------------------------------------------------------------- Wound Assessment Details Patient Name: SHAYNNA, LABAN. Date of  Service: 02/19/2016 9:15 AM Medical Record Number: [id number] Patient Account Number: [id number] Date of Birth/Sex: 02/24/1956 (60 y.o. Female) Treating RN: Cornell Barman Primary Care Physician: [name] Other Clinician: Referring Physician: [name] Treating Physician/Extender: Frann Rider in Treatment: 60 Wound Status Wound Number: 1 Primary Pressure Ulcer Etiology: Wound Location: Right Back Wound Open Wounding Event: Blister Status: Date Acquired: 10/19/2013 Comorbid Glaucoma, Anemia, Coronary Artery Weeks Of Treatment: 60 History: Disease, Hypertension, Type II Clustered Wound: No Diabetes, End Stage Renal  Disease, Osteoarthritis, Neuropathy Photos Wound Measurements Length: (cm) 2.8 Width: (cm) 5 Depth: (cm) 0.1 Area: (cm) 10.996 Volume: (cm) 1.1 % Reduction in Area: 71.4% % Reduction in Volume: 71.4% Epithelialization: Small (1-33%) Wound Description Classification: Category/Stage II Wound Margin: Flat and Intact Exudate Amount: Large Exudate Type: Sanguinous Exudate Color: red Foul Odor After Cleansing: No Wound Bed Granulation Amount: Large (67-100%) Exposed Structure Granulation Quality: Red Fascia Exposed: No Necrotic Amount: None Present (0%) Fat Layer Exposed: No Tendon Exposed: No Pang, [name] ([id number]) Muscle Exposed: No Joint Exposed: No Bone Exposed: No Limited to Skin Breakdown Periwound Skin Texture Texture Color No Abnormalities Noted: No No Abnormalities Noted: No Callus: No Atrophie Blanche: No Crepitus: No Cyanosis: No Excoriation: No Ecchymosis: No Fluctuance: No Erythema: No Friable: No Hemosiderin Staining: No Induration: No Mottled: No Localized Edema: No Pallor: No Rash: No Rubor: No Scarring: No Temperature / Pain Moisture Temperature: No Abnormality No Abnormalities Noted: No Dry / Scaly: No Maceration: No Moist: Yes Wound Preparation Ulcer Cleansing: Rinsed/Irrigated with Saline Topical Anesthetic Applied: None Treatment Notes Wound #1 (Right Back) 1. Cleansed with: Clean wound with Normal Saline 4. Dressing Applied: Other dressing (specify in notes) 5. Secondary Dressing Applied Non-Adherent pad 7. Secured with Tape Notes RTD cut to size Electronic Signature(s) Signed: 02/19/2016 4:16:06 PM By: Gretta Cool, RN, [name], Kim RN, [name] Entered By: Gretta Cool, RN, [name], Kim on 02/19/2016 09:20:40 [name], [name] ([id number]) -------------------------------------------------------------------------------- [place] Details [name]. Date of Service: 02/19/2016 9:15 AM Medical Record Number: [id number] Patient  Account Number: [id number] Date of Birth/Sex: 1956/02/03 (60 y.o. Female) Treating RN: Cornell Barman Primary Care Physician: [name] Other Clinician: Referring Physician: [name] Treating Physician/Extender: Frann Rider in Treatment: 60 Vital Signs Time Taken: 09:14 Temperature (F): 98.3 Height (in): 65 Pulse (bpm): 87 Weight (lbs): 156 Respiratory Rate (breaths/min): 20 Body Mass Index (BMI): 26 Blood Pressure (mmHg): 153/62 Reference Range: 80 - 120 mg / dl Electronic Signature(s) Signed: 02/19/2016 4:16:06 PM By: Gretta Cool, RN, [name], Kim RN, [name] Entered By: Gretta Cool, RN, [name], Kim on 02/19/2016 09:15:05

## 2016-02-26 ENCOUNTER — Encounter: Payer: Medicare Other | Admitting: Surgery

## 2016-02-26 DIAGNOSIS — E11622 Type 2 diabetes mellitus with other skin ulcer: Secondary | ICD-10-CM | POA: Diagnosis not present

## 2016-02-27 NOTE — Progress Notes (Signed)
[name], [name] ([id number]) Visit Report for 02/26/2016 Chief Complaint Document Details [name], [name]. Date of Service: 02/26/2016 9:15 AM Medical Record Number: [id number] Patient Account Number: [id number] Date of Birth/Sex: Nov 24, 1955 (60 y.o. Female) Treating RN: Cornell Barman Primary Care Physician: [name] Other Clinician: Referring Physician: [name] Treating Physician/Extender: Frann Rider in Treatment: 18 Information Obtained from: Patient Chief Complaint Patient presents to the wound care center for a consult due non healing wound. She has an open wound on her right upper back which she's had for about a year and she recently noticed a blister on her right lower extremity about 2 weeks ago. Electronic Signature(s) Signed: 02/26/2016 9:38:12 AM By: [name], [name] Entered By: [name] on 02/26/2016 09:38:12 [name], [name] ([id number]) -------------------------------------------------------------------------------- HPI Details [name], [name]. Date of Service: 02/26/2016 9:15 AM Medical Record Number: [id number] Patient Account Number: [id number] Date of Birth/Sex: 08/21/1955 (60 y.o. Female) Treating RN: Cornell Barman Primary Care Physician: [name] Other Clinician: Referring Physician: [name] Treating Physician/Extender: Frann Rider in Treatment: 72 History of Present Illness Location: right upper back and right lower extremity wounds Quality: Patient reports No Pain. Severity: Patient states wound (s) are getting better. Duration: Patient has had the wound for > 3 months prior to seeking treatment at the wound center Timing: she thought it first occurred when she was using a heating pad about a year ago after back surgery. Context: The wound appeared gradually over time Modifying Factors: Patient is currently on renal dialysis and receives treatments 3 times weekly Associated Signs and Symptoms:  Patient reports having: surgery scheduled for this week for a AV fistula left arm. HPI Description: 60 year old patient who is known to be a diabetic and has end-stage renal disease has had several comorbidities including coronary artery disease, hypertension, hyperlipidemia, pancreatitis, anemia, previous history of hysterectomy, cholecystectomy, left-sided salivary gland excision, bilateral cataract surgery,Peritoneal dialysis catheter, hemodialysis catheter. the area on the back has also been caused by instant pressure she used to sleep on a recliner all day and has significant kyphoscoliosis. As far as the wound on her right lower extremity she's not sure how this blister occurred but she thought it has been there for about 2 weeks. No recent blood investigations available and no recent hemoglobin A1c. 12/30/2014 -- she is an assisted living facility but I believe the nurses that have not followed instructions as she had some cream applied on her back and there was a different dressing. Last week she's had a AV fistula placed on her left forearm. 01/13/2015 -- she has had some localized infection at the port site and she's been on doxycycline for this. 02/05/2015 - he has developed a small blister on her right lower extremity. 02/10/2015 -- she has developed another small blister on her right anterior chest wall in the area where she's had tape for her dialysis access. this may just be injury caused by a tape burn. 02/18/2015 -- no new blisters and she had a dermatology opinion and they have taken a biopsy of her skin. She also had a left brachial AV fistula placed this week. 03/03/2015 -- though we do not have the pathology report yet the patient says she has been put on prednisone because this skin disease is possibly to do with her immune system and her dermatologist is recommended this. 03/10/2015 -- she has developed a new blister which is quite large on her right lower extremity on  the shin.  05/22/2015 -- she did very well with her dressing but on trying to remove the [place] it peels away the newly healed skin. ARASELY, FELDER ([id number]) 06/05/2015 -- the patient was in the ER this past week for severe bleeding from the right nostril and had to have ENT see her and do a packing. They've also been holding her heparin during her dialysis. The patient is going back to ENT today. Other than that she has had no other significant issues. 06/19/2015 -- she is back on her blood thinners and continues to have her hemodialysis as before. 07/31/2015 -- a large blister has popped up again on her right lower extremity in the same place in the mid shin area in the anterior lateral compartment. 10/05/2015 -- last week she was admitted to the hospital for 4 days as she had a pneumonia and was treated with IV and oral antibiotics. Electronic Signature(s) Signed: 02/26/2016 9:38:18 AM By: [name], [name] Entered By: [name] on 02/26/2016 09:38:18 [name], [name] ([id number]) -------------------------------------------------------------------------------- Physical Exam Details [name], [name] Date of Service: 02/26/2016 9:15 AM Medical Record Number: [id number] Patient Account Number: [id number] Date of Birth/Sex: June 23, 1956 (60 y.o. Female) Treating RN: Cornell Barman Primary Care Physician: [name] Other Clinician: Referring Physician: [name] Treating Physician/Extender: Frann Rider in Treatment: 61 Constitutional . Pulse regular. Respirations normal and unlabored. Afebrile. . Eyes Nonicteric. Reactive to light. Ears, Nose, Mouth, and Throat Lips, teeth, and gums WNL.Marland Kitchen Moist mucosa without lesions. Neck supple and nontender. No palpable supraclavicular or cervical adenopathy. Normal sized without goiter. Respiratory WNL. No retractions.. Breath sounds WNL, No rubs, rales, rhonchi, or wheeze.. Cardiovascular Heart rhythm and  rate regular, no murmur or gallop.. Pedal Pulses WNL. No clubbing, cyanosis or edema. Chest Breasts symmetical and no nipple discharge.. Breast tissue WNL, no masses, lumps, or tenderness.. Lymphatic No adneopathy. No adenopathy. No adenopathy. Musculoskeletal Adexa without tenderness or enlargement.. Digits and nails w/o clubbing, cyanosis, infection, petechiae, ischemia, or inflammatory conditions.. Integumentary (Hair, Skin) No suspicious lesions. No crepitus or fluctuance. No peri-wound warmth or erythema. No masses.Marland Kitchen Psychiatric Judgement and insight Intact.. No evidence of depression, anxiety, or agitation.. Notes she has very fragile skin and epithelium which tears open every time her dressing is changed. No debridement was required today. Electronic Signature(s) Signed: 02/26/2016 9:38:49 AM By: [name], [name] Entered By: [name] on 02/26/2016 09:38:49 Quinto, [name] ([id number]) -------------------------------------------------------------------------------- Physician Orders Details [name]. Date of Service: 02/26/2016 9:15 AM Medical Record Number: [id number] Patient Account Number: [id number] Date of Birth/Sex: March 24, 1956 (60 y.o. Female) Treating RN: Montey Hora Primary Care Physician: [name] Other Clinician: Referring Physician: [name] Treating Physician/Extender: Frann Rider in Treatment: 43 Verbal / Phone Orders: Yes Clinician: Montey Hora Read Back and Verified: Yes Diagnosis Coding Wound Cleansing Wound #1 Right Back o Clean wound with Normal Saline. o May Shower, gently pat wound dry prior to applying new dressing. - PATIENT MAY SHOWER AND [employer] WOUND WITH SOAP AND WATER WITH CNA ASSISTANCE AND PAT WOUND DRY AND SNF NURSE TO APPLY NEW DRESSING RIGHT AFTERWARDS Skin Barriers/Peri-Wound Care Wound #1 Right Back o Skin Prep Primary Wound Dressing Wound #1 Right Back o Other: - Tritec Foam -  Please wet prior to removing; Cover with [facility] Dressing Dressing Change Frequency Wound #1 Right Back o Other: - Change when soiled Follow-up Appointments Wound #1 Right Back o Return Appointment in 1 week. Electronic Signature(s) Signed: 02/26/2016  3:26:16 PM By: [name], [name] Signed: 02/26/2016 3:54:21 PM By: Montey Hora Entered By: Montey Hora on 02/26/2016 09:33:39 [name], [name] ([id number]) -------------------------------------------------------------------------------- Problem List Details Patient Name: ZAKIYYA, HYPES. Date of Service: 02/26/2016 9:15 AM Medical Record Number: [id number] Patient Account Number: [id number] Date of Birth/Sex: 1955/11/06 (60 y.o. Female) Treating RN: Cornell Barman Primary Care Physician: [name] Other Clinician: Referring Physician: [name] Treating Physician/Extender: Frann Rider in Treatment: 26 Active Problems ICD-10 Encounter Code Description Active Date Diagnosis E11.622 Type 2 diabetes mellitus with other skin ulcer 12/23/2014 Yes L89.112 Pressure ulcer of right upper back, stage 2 12/23/2014 Yes S80.821A Blister (nonthermal), right lower leg, initial encounter 12/23/2014 Yes N18.6 End stage renal disease 12/23/2014 Yes Inactive Problems Resolved Problems Electronic Signature(s) Signed: 02/26/2016 9:37:36 AM By: [name], [name] Entered By: [name] on 02/26/2016 09:37:35 [name], [name] ([id number]) -------------------------------------------------------------------------------- Progress Note Details [name], [name] Date of Service: 02/26/2016 9:15 AM Medical Record Number: [id number] Patient Account Number: [id number] Date of Birth/Sex: 05-Jan-1956 (60 y.o. Female) Treating RN: Cornell Barman Primary Care Physician: [name] Other Clinician: Referring Physician: [name] Treating Physician/Extender: Frann Rider in Treatment: 72 Subjective Chief  Complaint Information obtained from Patient Patient presents to the wound care center for a consult due non healing wound. She has an open wound on her right upper back which she's had for about a year and she recently noticed a blister on her right lower extremity about 2 weeks ago. History of Present Illness (HPI) The following HPI elements were documented for the patient's wound: Location: right upper back and right lower extremity wounds Quality: Patient reports No Pain. Severity: Patient states wound (s) are getting better. Duration: Patient has had the wound for > 3 months prior to seeking treatment at the wound center Timing: she thought it first occurred when she was using a heating pad about a year ago after back surgery. Context: The wound appeared gradually over time Modifying Factors: Patient is currently on renal dialysis and receives treatments 3 times weekly Associated Signs and Symptoms: Patient reports having: surgery scheduled for this week for a AV fistula left arm. 60 year old patient who is known to be a diabetic and has end-stage renal disease has had several comorbidities including coronary artery disease, hypertension, hyperlipidemia, pancreatitis, anemia, previous history of hysterectomy, cholecystectomy, left-sided salivary gland excision, bilateral cataract surgery,Peritoneal dialysis catheter, hemodialysis catheter. the area on the back has also been caused by instant pressure she used to sleep on a recliner all day and has significant kyphoscoliosis. As far as the wound on her right lower extremity she's not sure how this blister occurred but she thought it has been there for about 2 weeks. No recent blood investigations available and no recent hemoglobin A1c. 12/30/2014 -- she is an assisted living facility but I believe the nurses that have not followed instructions as she had some cream applied on her back and there was a different dressing. Last week she's  had a AV fistula placed on her left forearm. 01/13/2015 -- she has had some localized infection at the port site and she's been on doxycycline for this. 02/05/2015 - he has developed a small blister on her right lower extremity. 02/10/2015 -- she has developed another small blister on her right anterior chest wall in the area where she's had tape for her dialysis access. this may just be injury caused by a tape burn. 02/18/2015 -- no new blisters and she had a  dermatology opinion and they have taken a biopsy of her skin. [name], [name] ([id number]) She also had a left brachial AV fistula placed this week. 03/03/2015 -- though we do not have the pathology report yet the patient says she has been put on prednisone because this skin disease is possibly to do with her immune system and her dermatologist is recommended this. 03/10/2015 -- she has developed a new blister which is quite large on her right lower extremity on the shin. 05/22/2015 -- she did very well with her dressing but on trying to remove the [place] it peels away the newly healed skin. 06/05/2015 -- the patient was in the ER this past week for severe bleeding from the right nostril and had to have ENT see her and do a packing. They've also been holding her heparin during her dialysis. The patient is going back to ENT today. Other than that she has had no other significant issues. 06/19/2015 -- she is back on her blood thinners and continues to have her hemodialysis as before. 07/31/2015 -- a large blister has popped up again on her right lower extremity in the same place in the mid shin area in the anterior lateral compartment. 10/05/2015 -- last week she was admitted to the hospital for 4 days as she had a pneumonia and was treated with IV and oral antibiotics. Objective Constitutional Pulse regular. Respirations normal and unlabored. Afebrile. Vitals Time Taken: 9:19 AM, Height: 65 in, Weight: 156 lbs, BMI: 26,  Temperature: 98.0 F, Pulse: 80 bpm, Respiratory Rate: 16 breaths/min, Blood Pressure: 140/92 mmHg. Eyes Nonicteric. Reactive to light. Ears, Nose, Mouth, and Throat Lips, teeth, and gums WNL.Marland Kitchen Moist mucosa without lesions. Neck supple and nontender. No palpable supraclavicular or cervical adenopathy. Normal sized without goiter. Respiratory WNL. No retractions.. Breath sounds WNL, No rubs, rales, rhonchi, or wheeze.. Cardiovascular Heart rhythm and rate regular, no murmur or gallop.. Pedal Pulses WNL. No clubbing, cyanosis or edema. Chest Breasts symmetical and no nipple discharge.. Breast tissue WNL, no masses, lumps, or tenderness.[name] ([id number]) Lymphatic No adneopathy. No adenopathy. No adenopathy. Musculoskeletal Adexa without tenderness or enlargement.. Digits and nails w/o clubbing, cyanosis, infection, petechiae, ischemia, or inflammatory conditions.Marland Kitchen Psychiatric Judgement and insight Intact.. No evidence of depression, anxiety, or agitation.. General Notes: she has very fragile skin and epithelium which tears open every time her dressing is changed. No debridement was required today. Integumentary (Hair, Skin) No suspicious lesions. No crepitus or fluctuance. No peri-wound warmth or erythema. No masses.. Wound #1 status is Open. Original cause of wound was Blister. The wound is located on the Right Back. The wound measures 2.5cm length x 4.9cm width x 0.1cm depth; 9.621cm^2 area and 0.962cm^3 volume. The wound is limited to skin breakdown. There is a medium amount of sanguinous drainage noted. The wound margin is flat and intact. There is large (67-100%) red granulation within the wound bed. There is no necrotic tissue within the wound bed. The periwound skin appearance exhibited: Scarring, Moist, Ecchymosis. The periwound skin appearance did not exhibit: Callus, Crepitus, Excoriation, Fluctuance, Friable, Induration, Localized Edema, Rash, Dry/Scaly,  Maceration, Atrophie Blanche, Cyanosis, Hemosiderin Staining, Mottled, Pallor, Rubor, Erythema. Periwound temperature was noted as No Abnormality. Assessment Active Problems ICD-10 E11.622 - Type 2 diabetes mellitus with other skin ulcer L89.112 - Pressure ulcer of right upper back, stage 2 S80.821A - Blister (nonthermal), right lower leg, initial encounter N18.6 - End stage renal disease In view of the fragile skin I am going  to try Tritec Ultra silver foam, lasted to change it only twice a week if possible. SHANNIE, BERROA ([id number]) Plan Wound Cleansing: Wound #1 Right Back: Clean wound with Normal Saline. May Shower, gently pat wound dry prior to applying new dressing. - PATIENT MAY SHOWER AND [place] WITH SOAP AND WATER WITH CNA ASSISTANCE AND PAT WOUND DRY AND SNF NURSE TO APPLY NEW DRESSING RIGHT AFTERWARDS Skin Barriers/Peri-Wound Care: Wound #1 Right Back: Skin Prep Primary Wound Dressing: Wound #1 Right Back: Other: - Tritec Foam - Please wet prior to removing; Cover with [facility] Dressing Dressing Change Frequency: Wound #1 Right Back: Other: - Change when soiled Follow-up Appointments: Wound #1 Right Back: Return Appointment in 1 week. In view of the fragile skin I am going to try Tritec Ultra silver foam, lasted to change it only twice a week if possible. Electronic Signature(s) Signed: 02/26/2016 9:39:58 AM By: [name], [name] Entered By: [name] on 02/26/2016 09:39:58 [name], [name] ([id number]) -------------------------------------------------------------------------------- SuperBill Details [name]. Date of Service: 02/26/2016 Medical Record Number: [id number] Patient Account Number: [id number] Date of Birth/Sex: November 11, 1955 (60 y.o. Female) Treating RN: Cornell Barman Primary Care Physician: [name] Other Clinician: Referring Physician: [name] Treating Physician/Extender: Frann Rider in Treatment:  61 Diagnosis Coding ICD-10 Codes Code Description E11.622 Type 2 diabetes mellitus with other skin ulcer L89.112 Pressure ulcer of right upper back, stage 2 S80.821A Blister (nonthermal), right lower leg, initial encounter N18.6 End stage renal disease Facility Procedures CPT4 Code: [id number] Description: [phone] - WOUND CARE VISIT-LEV 2 EST PT Modifier: Quantity: 1 Physician Procedures CPT4 Code: [id number] Description: [id number] - WC PHYS LEVEL 3 - EST PT ICD-10 Description Diagnosis E11.622 Type 2 diabetes mellitus with other skin ulcer L89.112 Pressure ulcer of right upper back, stage 2 S80.821A Blister (nonthermal), right lower leg, initial enc N18.6 End  stage renal disease Modifier: ounter Quantity: 1 Electronic Signature(s) Signed: 02/26/2016 3:26:16 PM By: [name], [name] Signed: 02/26/2016 3:54:21 PM By: Montey Hora Previous Signature: 02/26/2016 9:42:40 AM Version By: [name], [name] Previous Signature: 02/26/2016 9:42:18 AM Version By: [name], [name] Entered By: Montey Hora on 02/26/2016 10:36:03

## 2016-03-04 ENCOUNTER — Encounter (HOSPITAL_BASED_OUTPATIENT_CLINIC_OR_DEPARTMENT_OTHER): Payer: Medicare Other | Admitting: General Surgery

## 2016-03-04 DIAGNOSIS — L89102 Pressure ulcer of unspecified part of back, stage 2: Secondary | ICD-10-CM

## 2016-03-04 DIAGNOSIS — L89112 Pressure ulcer of right upper back, stage 2: Secondary | ICD-10-CM

## 2016-03-04 DIAGNOSIS — E11622 Type 2 diabetes mellitus with other skin ulcer: Secondary | ICD-10-CM | POA: Diagnosis not present

## 2016-03-04 NOTE — Progress Notes (Signed)
See I heal 

## 2016-03-05 NOTE — Progress Notes (Signed)
[name], [name] ([id number]) Visit Report for 03/04/2016 Chief Complaint Document Details [name], [name]. Date of Service: 03/04/2016 9:30 AM Medical Record Number: [id number] Patient Account Number: [id number] Date of Birth/Sex: 01-02-1956 (60 y.o. Female) Treating RN: Cornell Barman Primary Care Physician: [name] Other Clinician: Referring Physician: [name] Treating Physician/Extender: Benjaman Pott in Treatment: 85 Information Obtained from: Patient Chief Complaint Patient presents to the wound care center for a consult due non healing wound. She has an open wound on her right upper back which she's had for about a year and she recently noticed a blister on her right lower extremity about 2 weeks ago. Electronic Signature(s) Signed: 03/04/2016 1:52:30 PM By: Judene Companion MD Entered By: Judene Companion on 03/04/2016 13:52:29 Rincon, [name] ([id number]) -------------------------------------------------------------------------------- HPI Details [name], [name]. Date of Service: 03/04/2016 9:30 AM Medical Record Number: [id number] Patient Account Number: [id number] Date of Birth/Sex: 1955/12/18 (60 y.o. Female) Treating RN: Cornell Barman Primary Care Physician: [name] Other Clinician: Referring Physician: [name] Treating Physician/Extender: Benjaman Pott in Treatment: 70 History of Present Illness Location: right upper back and right lower extremity wounds Quality: Patient reports No Pain. Severity: Patient states wound (s) are getting better. Duration: Patient has had the wound for > 3 months prior to seeking treatment at the wound center Timing: she thought it first occurred when she was using a heating pad about a year ago after back surgery. Context: The wound appeared gradually over time Modifying Factors: Patient is currently on renal dialysis and receives treatments 3 times weekly Associated Signs and Symptoms:  Patient reports having: surgery scheduled for this week for a AV fistula left arm. HPI Description: 59 year old patient who is known to be a diabetic and has end-stage renal disease has had several comorbidities including coronary artery disease, hypertension, hyperlipidemia, pancreatitis, anemia, previous history of hysterectomy, cholecystectomy, left-sided salivary gland excision, bilateral cataract surgery,Peritoneal dialysis catheter, hemodialysis catheter. the area on the back has also been caused by instant pressure she used to sleep on a recliner all day and has significant kyphoscoliosis. As far as the wound on her right lower extremity she's not sure how this blister occurred but she thought it has been there for about 2 weeks. No recent blood investigations available and no recent hemoglobin A1c. 12/30/2014 -- she is an assisted living facility but I believe the nurses that have not followed instructions as she had some cream applied on her back and there was a different dressing. Last week she's had a AV fistula placed on her left forearm. 01/13/2015 -- she has had some localized infection at the port site and she's been on doxycycline for this. 02/05/2015 - he has developed a small blister on her right lower extremity. 02/10/2015 -- she has developed another small blister on her right anterior chest wall in the area where she's had tape for her dialysis access. this may just be injury caused by a tape burn. 02/18/2015 -- no new blisters and she had a dermatology opinion and they have taken a biopsy of her skin. She also had a left brachial AV fistula placed this week. 03/03/2015 -- though we do not have the pathology report yet the patient says she has been put on prednisone because this skin disease is possibly to do with her immune system and her dermatologist is recommended this. 03/10/2015 -- she has developed a new blister which is quite large on her right lower extremity on  the shin. 05/22/2015 --  she did very well with her dressing but on trying to remove the [place] it peels away the newly healed skin. [name], [name] ([id number]) 06/05/2015 -- the patient was in the ER this past week for severe bleeding from the right nostril and had to have ENT see her and do a packing. They've also been holding her heparin during her dialysis. The patient is going back to ENT today. Other than that she has had no other significant issues. 06/19/2015 -- she is back on her blood thinners and continues to have her hemodialysis as before. 07/31/2015 -- a large blister has popped up again on her right lower extremity in the same place in the mid shin area in the anterior lateral compartment. 10/05/2015 -- last week she was admitted to the hospital for 4 days as she had a pneumonia and was treated with IV and oral antibiotics. Electronic Signature(s) Signed: 03/04/2016 1:52:38 PM By: Judene Companion MD Entered By: Judene Companion on 03/04/2016 13:52:38 Hester, [name] ([id number]) -------------------------------------------------------------------------------- Physical Exam Details [name], [name]. Date of Service: 03/04/2016 9:30 AM Medical Record Number: [id number] Patient Account Number: [id number] Date of Birth/Sex: Apr 21, 1956 (60 y.o. Female) Treating RN: Cornell Barman Primary Care Physician: [name] Other Clinician: Referring Physician: [name] Treating Physician/Extender: Benjaman Pott in Treatment: 38 Electronic Signature(s) Signed: 03/04/2016 1:52:47 PM By: Judene Companion MD Entered By: Judene Companion on 03/04/2016 13:52:46 [name], [name] ([id number]) -------------------------------------------------------------------------------- Physician Orders Details [name]. Date of Service: 03/04/2016 9:30 AM Medical Record Number: [id number] Patient Account Number: [id number] Date of Birth/Sex: Apr 22, 1956 (60 y.o.  Female) Treating RN: Montey Hora Primary Care Physician: [name] Other Clinician: Referring Physician: [name] Treating Physician/Extender: Benjaman Pott in Treatment: 68 Verbal / Phone Orders: Yes Clinician: Montey Hora Read Back and Verified: Yes Diagnosis Coding Wound Cleansing Wound #1 Right Back o Clean wound with Normal Saline. o May Shower, gently pat wound dry prior to applying new dressing. - PATIENT MAY SHOWER AND [place] WOUND WITH SOAP AND WATER WITH CNA ASSISTANCE AND PAT WOUND DRY AND SNF NURSE TO APPLY NEW DRESSING RIGHT AFTERWARDS Skin Barriers/Peri-Wound Care Wound #1 Right Back o Skin Prep Primary Wound Dressing Wound #1 Right Back o Other: - Tritec Foam - Please wet prior to removing; Cover with [facility] Dressing Dressing Change Frequency Wound #1 Right Back o Other: - Change when soiled Follow-up Appointments Wound #1 Right Back o Return Appointment in 1 week. Electronic Signature(s) Signed: 03/04/2016 4:57:01 PM By: Judene Companion MD Signed: 03/04/2016 4:59:06 PM By: Montey Hora Entered By: Montey Hora on 03/04/2016 09:34:42 [name], [name] ([id number]) -------------------------------------------------------------------------------- Problem List Details [name], [name]. Date of Service: 03/04/2016 9:30 AM Medical Record Number: [id number] Patient Account Number: [id number] Date of Birth/Sex: 08/04/1955 (60 y.o. Female) Treating RN: Cornell Barman Primary Care Physician: [name] Other Clinician: Referring Physician: [name] Treating Physician/Extender: Benjaman Pott in Treatment: 35 Active Problems ICD-10 Encounter Code Description Active Date Diagnosis E11.622 Type 2 diabetes mellitus with other skin ulcer 12/23/2014 Yes L89.112 Pressure ulcer of right upper back, stage 2 12/23/2014 Yes S80.821A Blister (nonthermal), right lower leg, initial encounter 12/23/2014 Yes N18.6 End stage  renal disease 12/23/2014 Yes Inactive Problems Resolved Problems Electronic Signature(s) Signed: 03/04/2016 1:52:19 PM By: Judene Companion MD Entered By: Judene Companion on 03/04/2016 13:52:18 [name], [name] ([id number]) -------------------------------------------------------------------------------- Progress Note Details [name]. Date of Service: 03/04/2016 9:30 AM Medical Record Number: [id number]  Patient Account Number: [id number] Date of Birth/Sex: November 01, 1955 (60 y.o. Female) Treating RN: Cornell Barman Primary Care Physician: [name] Other Clinician: Referring Physician: [name] Treating Physician/Extender: Benjaman Pott in Treatment: 48 Subjective Chief Complaint Information obtained from Patient Patient presents to the wound care center for a consult due non healing wound. She has an open wound on her right upper back which she's had for about a year and she recently noticed a blister on her right lower extremity about 2 weeks ago. History of Present Illness (HPI) The following HPI elements were documented for the patient's wound: Location: right upper back and right lower extremity wounds Quality: Patient reports No Pain. Severity: Patient states wound (s) are getting better. Duration: Patient has had the wound for > 3 months prior to seeking treatment at the wound center Timing: she thought it first occurred when she was using a heating pad about a year ago after back surgery. Context: The wound appeared gradually over time Modifying Factors: Patient is currently on renal dialysis and receives treatments 3 times weekly Associated Signs and Symptoms: Patient reports having: surgery scheduled for this week for a AV fistula left arm. 60 year old patient who is known to be a diabetic and has end-stage renal disease has had several comorbidities including coronary artery disease, hypertension, hyperlipidemia, pancreatitis, anemia, previous history of  hysterectomy, cholecystectomy, left-sided salivary gland excision, bilateral cataract surgery,Peritoneal dialysis catheter, hemodialysis catheter. the area on the back has also been caused by instant pressure she used to sleep on a recliner all day and has significant kyphoscoliosis. As far as the wound on her right lower extremity she's not sure how this blister occurred but she thought it has been there for about 2 weeks. No recent blood investigations available and no recent hemoglobin A1c. 12/30/2014 -- she is an assisted living facility but I believe the nurses that have not followed instructions as she had some cream applied on her back and there was a different dressing. Last week she's had a AV fistula placed on her left forearm. 01/13/2015 -- she has had some localized infection at the port site and she's been on doxycycline for this. 02/05/2015 - he has developed a small blister on her right lower extremity. 02/10/2015 -- she has developed another small blister on her right anterior chest wall in the area where she's had tape for her dialysis access. this may just be injury caused by a tape burn. 02/18/2015 -- no new blisters and she had a dermatology opinion and they have taken a biopsy of her skin. [name], [name] ([id number]) She also had a left brachial AV fistula placed this week. 03/03/2015 -- though we do not have the pathology report yet the patient says she has been put on prednisone because this skin disease is possibly to do with her immune system and her dermatologist is recommended this. 03/10/2015 -- she has developed a new blister which is quite large on her right lower extremity on the shin. 05/22/2015 -- she did very well with her dressing but on trying to remove the [place] it peels away the newly healed skin. 06/05/2015 -- the patient was in the ER this past week for severe bleeding from the right nostril and had to have ENT see her and do a packing. They've  also been holding her heparin during her dialysis. The patient is going back to ENT today. Other than that she has had no other significant issues. 06/19/2015 -- she is back on her  blood thinners and continues to have her hemodialysis as before. 07/31/2015 -- a large blister has popped up again on her right lower extremity in the same place in the mid shin area in the anterior lateral compartment. 10/05/2015 -- last week she was admitted to the hospital for 4 days as she had a pneumonia and was treated with IV and oral antibiotics. Objective Constitutional Vitals Time Taken: 9:26 AM, Height: 65 in, Weight: 156 lbs, BMI: 26, Temperature: 98.5 F, Pulse: 80 bpm, Respiratory Rate: 18 breaths/min, Blood Pressure: 95/50 mmHg. Integumentary (Hair, Skin) Wound #1 status is Open. Original cause of wound was Blister. The wound is located on the Right Back. The wound measures 2.1cm length x 4.5cm width x 0.1cm depth; 7.422cm^2 area and 0.742cm^3 volume. The wound is limited to skin breakdown. There is no tunneling or undermining noted. There is a medium amount of sanguinous drainage noted. The wound margin is flat and intact. There is large (67-100%) red granulation within the wound bed. There is no necrotic tissue within the wound bed. The periwound skin appearance exhibited: Scarring, Moist, Ecchymosis. The periwound skin appearance did not exhibit: Callus, Crepitus, Excoriation, Fluctuance, Friable, Induration, Localized Edema, Rash, Dry/Scaly, Maceration, Atrophie Blanche, Cyanosis, Hemosiderin Staining, Mottled, Pallor, Rubor, Erythema. Periwound temperature was noted as No Abnormality. Pressure ulcer 4 cm . More superficial today. Treat withTritex dressings on back ulcer Assessment [name], GENSKE ([id number]) Active Problems ICD-10 E11.622 - Type 2 diabetes mellitus with other skin ulcer L89.112 - Pressure ulcer of right upper back, stage 2 S80.821A - Blister (nonthermal), right lower leg,  initial encounter N18.6 - End stage renal disease Plan Wound Cleansing: Wound #1 Right Back: Clean wound with Normal Saline. May Shower, gently pat wound dry prior to applying new dressing. - PATIENT MAY SHOWER AND [place] WITH SOAP AND WATER WITH CNA ASSISTANCE AND PAT WOUND DRY AND SNF NURSE TO APPLY NEW DRESSING RIGHT AFTERWARDS Skin Barriers/Peri-Wound Care: Wound #1 Right Back: Skin Prep Primary Wound Dressing: Wound #1 Right Back: Other: - Tritec Foam - Please wet prior to removing; Cover with [facility] Dressing Dressing Change Frequency: Wound #1 Right Back: Other: - Change when soiled Follow-up Appointments: Wound #1 Right Back: Return Appointment in 1 week. Follow-Up Appointments: A follow-up appointment should be scheduled. Medication Reconciliation completed and provided to Patient/Care Provider. Electronic Signature(s) Signed: 03/04/2016 1:54:49 PM By: Judene Companion MD Entered By: Judene Companion on 03/04/2016 13:54:49 Karman, [name] ([id number]) Rieke, [name] ([id number]) -------------------------------------------------------------------------------- SuperBill Details Patient Name: LAURENCIA, REINHEIMER. Date of Service: 03/04/2016 Medical Record Number: [id number] Patient Account Number: [id number] Date of Birth/Sex: 07-24-1955 (60 y.o. Female) Treating RN: Cornell Barman Primary Care Physician: [name] Other Clinician: Referring Physician: [name] Treating Physician/Extender: Benjaman Pott in Treatment: 62 Diagnosis Coding ICD-10 Codes Code Description E11.622 Type 2 diabetes mellitus with other skin ulcer L89.112 Pressure ulcer of right upper back, stage 2 S80.821A Blister (nonthermal), right lower leg, initial encounter N18.6 End stage renal disease Facility Procedures CPT4 Code: [id number] Description: [id number] - WOUND CARE VISIT-LEV 2 EST PT Modifier: Quantity: 1 Physician Procedures CPT4 Code: [id number] Description: [id number] - WC PHYS LEVEL 2  - EST PT ICD-10 Description Diagnosis L89.112 Pressure ulcer of right upper back, stage 2 Modifier: Quantity: 1 Electronic Signature(s) Signed: 03/04/2016 1:55:12 PM By: Judene Companion MD Entered By: Judene Companion on 03/04/2016 13:55:12

## 2016-03-09 NOTE — Progress Notes (Signed)
KIMBERLIE, TINNEY ([id number]) Visit Report for 03/04/2016 Arrival Information Details [name], [name]. Date of Service: 03/04/2016 9:30 AM Medical Record Number: [id number] Patient Account Number: [id number] Date of Birth/Sex: 06-Mar-1956 (60 y.o. Female) Treating RN: Cornell Barman Primary Care Physician: [name] Other Clinician: Referring Physician: [name] Treating Physician/Extender: Benjaman Pott in Treatment: 96 Visit Information History Since Last Visit Added or deleted any medications: No Patient Arrived: Walker Any new allergies or adverse reactions: No Arrival Time: 09:25 Had a fall or experienced change in No Accompanied By: self activities of daily living that may affect Transfer Assistance: None risk of falls: Patient Identification Verified: Yes Signs or symptoms of abuse/neglect since last No Secondary Verification Process Yes visito Completed: Hospitalized since last visit: No Patient Requires Transmission- No Has Dressing in Place as Prescribed: Yes Based Precautions: Pain Present Now: No Patient Has Alerts: Yes Patient Alerts: Patient on Blood Thinner Electronic Signature(s) Signed: 03/09/2016 10:00:43 AM By: Gretta Cool, RN, [name], Kim RN, [name] Entered By: Gretta Cool, RN, [name], Kim on 03/04/2016 09:26:19 Ganson, [name] ([id number]) -------------------------------------------------------------------------------- Clinic Level of Care Assessment Details [name]. Date of Service: 03/04/2016 9:30 AM Medical Record Number: [id number] Patient Account Number: [id number] Date of Birth/Sex: [birth year] (60 y.o. Female) Treating RN: Montey Hora Primary Care Physician: [name] Other Clinician: Referring Physician: [name] Treating Physician/Extender: Benjaman Pott in Treatment: 62 Clinic Level of Care Assessment Items TOOL 4 Quantity Score []  - Use when only an EandM is performed on FOLLOW-UP visit 0 ASSESSMENTS -  Nursing Assessment / Reassessment X - Reassessment of Co-morbidities (includes updates in patient status) 1 10 X - Reassessment of Adherence to Treatment Plan 1 5 ASSESSMENTS - Wound and Skin Assessment / Reassessment X - Simple Wound Assessment / Reassessment - one wound 1 5 []  - Complex Wound Assessment / Reassessment - multiple wounds 0 []  - Dermatologic / Skin Assessment (not related to wound area) 0 ASSESSMENTS - Focused Assessment []  - Circumferential Edema Measurements - multi extremities 0 []  - Nutritional Assessment / Counseling / Intervention 0 []  - Lower Extremity Assessment (monofilament, tuning fork, pulses) 0 []  - Peripheral Arterial Disease Assessment (using hand held doppler) 0 ASSESSMENTS - Ostomy and/or Continence Assessment and Care []  - Incontinence Assessment and Management 0 []  - Ostomy Care Assessment and Management (repouching, etc.) 0 PROCESS - Coordination of Care X - Simple Patient / Family Education for ongoing care 1 15 []  - Complex (extensive) Patient / Family Education for ongoing care 0 []  - Staff obtains Programmer, systems, Records, Test Results / Process Orders 0 []  - Staff telephones HHA, Nursing Homes / Clarify orders / etc 0 []  - Routine Transfer to another Facility (non-emergent condition) 0 [name], [name] ([id number]) []  - Routine Hospital Admission (non-emergent condition) 0 []  - New Admissions / Biomedical engineer / Ordering NPWT, Apligraf, etc. 0 []  - Emergency Hospital Admission (emergent condition) 0 X - Simple Discharge Coordination 1 10 []  - Complex (extensive) Discharge Coordination 0 PROCESS - Special Needs []  - Pediatric / Minor Patient Management 0 []  - Isolation Patient Management 0 []  - Hearing / Language / Visual special needs 0 []  - Assessment of Community assistance (transportation, D/C planning, etc.) 0 []  - Additional assistance / Altered mentation 0 []  - Support Surface(s) Assessment (bed, cushion, seat, etc.) 0 INTERVENTIONS -  Wound Cleansing / Measurement X - Simple Wound Cleansing - one wound 1 5 []  - Complex Wound Cleansing - multiple wounds 0 X -  Wound Imaging (photographs - any number of wounds) 1 5 []  - Wound Tracing (instead of photographs) 0 X - Simple Wound Measurement - one wound 1 5 []  - Complex Wound Measurement - multiple wounds 0 INTERVENTIONS - Wound Dressings X - Small Wound Dressing one or multiple wounds 1 10 []  - Medium Wound Dressing one or multiple wounds 0 []  - Large Wound Dressing one or multiple wounds 0 []  - Application of Medications - topical 0 []  - Application of Medications - injection 0 INTERVENTIONS - Miscellaneous []  - External ear exam 0 [name], [name] ([id number]) []  - Specimen Collection (cultures, biopsies, blood, body fluids, etc.) 0 []  - Specimen(s) / Culture(s) sent or taken to Lab for analysis 0 []  - Patient Transfer (multiple staff / Harrel Lemon Lift / Similar devices) 0 []  - Simple Staple / Suture removal (25 or less) 0 []  - Complex Staple / Suture removal (26 or more) 0 []  - Hypo / Hyperglycemic Management (close monitor of Blood Glucose) 0 []  - Ankle / Brachial Index (ABI) - do not check if billed separately 0 X - Vital Signs 1 5 Has the patient been seen at the hospital within the last three years: Yes Total Score: 75 Level Of Care: New/Established - Level 2 Electronic Signature(s) Signed: 03/04/2016 4:59:06 PM By: Montey Hora Entered By: Montey Hora on 03/04/2016 11:17:57 [name], [name] ([id number]) -------------------------------------------------------------------------------- Encounter Discharge Information Details [name]. Date of Service: 03/04/2016 9:30 AM Medical Record Number: [id number] Patient Account Number: [id number] Date of Birth/Sex: 1956/05/17 (60 y.o. Female) Treating RN: Cornell Barman Primary Care Physician: [name] Other Clinician: Referring Physician: [name] Treating Physician/Extender: Benjaman Pott  in Treatment: 41 Encounter Discharge Information Items Discharge Pain Level: 0 Discharge Condition: Stable Ambulatory Status: Walker Discharge Destination: Home Private Transportation: Auto Accompanied By: self Schedule Follow-up Appointment: Yes Medication Reconciliation completed and Yes provided to Patient/Care Julianne Chamberlin: Clinical Summary of Care: Electronic Signature(s) Signed: 03/04/2016 1:55:40 PM By: Judene Companion MD Entered By: Judene Companion on 03/04/2016 13:55:39 [name], [name] ([id number]) -------------------------------------------------------------------------------- Multi Wound Chart Details [name]. Date of Service: 03/04/2016 9:30 AM Medical Record Number: [id number] Patient Account Number: [id number] Date of Birth/Sex: 1955/09/29 (60 y.o. Female) Treating RN: Montey Hora Primary Care Physician: [name] Other Clinician: Referring Physician: [name] Treating Physician/Extender: Benjaman Pott in Treatment: 62 Vital Signs Height(in): 65 Pulse(bpm): 80 Weight(lbs): 156 Blood Pressure 95/50 (mmHg): Body Mass Index(BMI): 26 Temperature(F): 98.5 Respiratory Rate 18 (breaths/min): Photos: [N/A:N/A] Wound Location: Right Back N/A N/A Wounding Event: Blister N/A N/A Primary Etiology: Pressure Ulcer N/A N/A Comorbid History: Glaucoma, Anemia, N/A N/A Coronary Artery Disease, Hypertension, Type II Diabetes, End Stage Renal Disease, Osteoarthritis, Neuropathy Date Acquired: 10/19/2013 N/A N/A Weeks of Treatment: 62 N/A N/A Wound Status: Open N/A N/A Measurements L x W x D 2.1x4.5x0.1 N/A N/A (cm) Area (cm) : 7.422 N/A N/A Volume (cm) : 0.742 N/A N/A % Reduction in Area: 80.70% N/A N/A % Reduction in Volume: 80.70% N/A N/A Classification: Category/Stage II N/A N/A Exudate Amount: Medium N/A N/A Exudate Type: Sanguinous N/A N/A Exudate Color: red N/A N/A Wound Margin: Flat and Intact N/A N/A [name], [name]  ([id number]) Granulation Amount: Large (67-100%) N/A N/A Granulation Quality: Red N/A N/A Necrotic Amount: None Present (0%) N/A N/A Exposed Structures: Fascia: No N/A N/A Fat: No Tendon: No Muscle: No Joint: No Bone: No Limited to Skin Breakdown Epithelialization: Small (1-33%) N/A N/A Periwound Skin Texture: Scarring: Yes N/A  N/A Edema: No Excoriation: No Induration: No Callus: No Crepitus: No Fluctuance: No Friable: No Rash: No Periwound Skin Moist: Yes N/A N/A Moisture: Maceration: No Dry/Scaly: No Periwound Skin Color: Ecchymosis: Yes N/A N/A Atrophie Blanche: No Cyanosis: No Erythema: No Hemosiderin Staining: No Mottled: No Pallor: No Rubor: No Temperature: No Abnormality N/A N/A Tenderness on No N/A N/A Palpation: Wound Preparation: Ulcer Cleansing: N/A N/A Rinsed/Irrigated with Saline Topical Anesthetic Applied: None Treatment Notes Electronic Signature(s) Signed: 03/04/2016 4:59:06 PM By: Montey Hora Entered By: Montey Hora on 03/04/2016 09:34:06 [name], [name] ([id number]) -------------------------------------------------------------------------------- [place] Details [name]. Date of Service: 03/04/2016 9:30 AM Medical Record Number: [id number] Patient Account Number: [id number] Date of Birth/Sex: Apr 25, 1956 (60 y.o. Female) Treating RN: Montey Hora Primary Care Physician: [name] Other Clinician: Referring Physician: [name] Treating Physician/Extender: Benjaman Pott in Treatment: 62 Active Inactive Abuse / Safety / Falls / Self Care Management Nursing Diagnoses: Potential for falls Goals: Patient will remain injury free Date Initiated: 12/23/2014 Goal Status: Active Patient/caregiver will verbalize understanding of skin care regimen Date Initiated: 12/23/2014 Goal Status: Active Patient/caregiver will verbalize/demonstrate measures taken to prevent injury and/or falls Date  Initiated: 12/23/2014 Goal Status: Active Patient/caregiver will verbalize/demonstrate understanding of what to do in case of emergency Date Initiated: 12/23/2014 Goal Status: Active Interventions: Assess fall risk on admission and as needed Assess: immobility, friction, shearing, incontinence upon admission and as needed Assess impairment of mobility on admission and as needed per policy Assess self care needs on admission and as needed Provide education on fall prevention Notes: Wound/Skin Impairment Nursing Diagnoses: Impaired tissue integrity Knowledge deficit related to ulceration/compromised skin integrity Goals: Patient/caregiver will verbalize understanding of skin care regimen [name], [name] ([id number]) Date Initiated: 12/23/2014 Goal Status: Active Ulcer/skin breakdown will heal within 14 weeks Date Initiated: 12/23/2014 Goal Status: Active Interventions: Assess patient/caregiver ability to obtain necessary supplies Assess patient/caregiver ability to perform ulcer/skin care regimen upon admission and as needed Assess ulceration(s) every visit Provide education on ulcer and skin care Treatment Activities: Skin care regimen initiated : 11/05/2015 Topical wound management initiated : 11/05/2015 Notes: Electronic Signature(s) Signed: 03/04/2016 4:59:06 PM By: Montey Hora Entered By: Montey Hora on 03/04/2016 09:33:56 [name], [name] ([id number]) -------------------------------------------------------------------------------- Pain Assessment Details [name]. Date of Service: 03/04/2016 9:30 AM Medical Record Number: [id number] Patient Account Number: [id number] Date of Birth/Sex: February 06, 1956 (60 y.o. Female) Treating RN: Cornell Barman Primary Care Physician: [name] Other Clinician: Referring Physician: [name] Treating Physician/Extender: Benjaman Pott in Treatment: 62 Active Problems Location of Pain Severity and Description of  Pain Patient Has Paino No Site Locations With Dressing Change: No Pain Management and Medication Current Pain Management: Electronic Signature(s) Signed: 03/09/2016 10:00:43 AM By: Gretta Cool, RN, [name], Kim RN, [name] Entered By: Gretta Cool, RN, [name], Kim on 03/04/2016 09:26:26 [name], [name] ([id number]) -------------------------------------------------------------------------------- Patient/Caregiver Education Details [name]. Date of Service: 03/04/2016 9:30 AM Medical Record Number: [id number] Patient Account Number: [id number] Date of Birth/Gender: 1956-01-28 (60 y.o. Female) Treating RN: Cornell Barman Primary Care Physician: [name] Other Clinician: Referring Physician: [name] Treating Physician/Extender: Benjaman Pott in Treatment: 25 Education Assessment Education Provided To: Patient Education Topics Provided Wound/Skin Impairment: Handouts: Caring for Your Ulcer Methods: Demonstration, Explain/Verbal Responses: State content correctly Electronic Signature(s) Signed: 03/04/2016 4:57:01 PM By: Judene Companion MD Entered By: Judene Companion on 03/04/2016 13:55:48 [name], [name] ([id number]) -------------------------------------------------------------------------------- Wound Assessment Details [name], Kiesha S. Date  of Service: 03/04/2016 9:30 AM Medical Record Number: [id number] Patient Account Number: [id number] Date of Birth/Sex: April 21, 1956 (60 y.o. Female) Treating RN: Cornell Barman Primary Care Physician: [name] Other Clinician: Referring Physician: [name] Treating Physician/Extender: Benjaman Pott in Treatment: 26 Wound Status Wound Number: 1 Primary Pressure Ulcer Etiology: Wound Location: Right Back Wound Open Wounding Event: Blister Status: Date Acquired: 10/19/2013 Comorbid Glaucoma, Anemia, Coronary Artery Weeks Of Treatment: 62 History: Disease, Hypertension, Type II Clustered Wound: No Diabetes, End  Stage Renal Disease, Osteoarthritis, Neuropathy Photos Wound Measurements Length: (cm) 2.1 Width: (cm) 4.5 Depth: (cm) 0.1 Area: (cm) 7.422 Volume: (cm) 0.742 % Reduction in Area: 80.7% % Reduction in Volume: 80.7% Epithelialization: Small (1-33%) Tunneling: No Undermining: No Wound Description Classification: Category/Stage II Wound Margin: Flat and Intact Exudate Amount: Medium Exudate Type: Sanguinous Exudate Color: red Foul Odor After Cleansing: No Wound Bed Granulation Amount: Large (67-100%) Exposed Structure Granulation Quality: Red Fascia Exposed: No Necrotic Amount: None Present (0%) Fat Layer Exposed: No Tendon Exposed: No Stratton, Bryonna S. ([id number]) Muscle Exposed: No Joint Exposed: No Bone Exposed: No Limited to Skin Breakdown Periwound Skin Texture Texture Color No Abnormalities Noted: No No Abnormalities Noted: No Callus: No Atrophie Blanche: No Crepitus: No Cyanosis: No Excoriation: No Ecchymosis: Yes Fluctuance: No Erythema: No Friable: No Hemosiderin Staining: No Induration: No Mottled: No Localized Edema: No Pallor: No Rash: No Rubor: No Scarring: Yes Temperature / Pain Moisture Temperature: No Abnormality No Abnormalities Noted: No Dry / Scaly: No Maceration: No Moist: Yes Wound Preparation Ulcer Cleansing: Rinsed/Irrigated with Saline Topical Anesthetic Applied: None Treatment Notes Wound #1 (Right Back) 1. Cleansed with: Clean wound with Normal Saline 4. Dressing Applied: Other dressing (specify in notes) 5. Secondary [place] Notes tritec silver foam Electronic Signature(s) Signed: 03/09/2016 10:00:43 AM By: Gretta Cool, RN, [name], Kim RN, [name] Entered By: Gretta Cool, RN, [name], Kim on 03/04/2016 09:32:34 Giacobbe, [name] ([id number]) -------------------------------------------------------------------------------- [place] Details [name]. Date of Service: 03/04/2016 9:30 AM Medical Record  Number: [id number] Patient Account Number: [id number] Date of Birth/Sex: [birth year] (60 y.o. Female) Treating RN: Cornell Barman Primary Care Physician: [name] Other Clinician: Referring Physician: [name] Treating Physician/Extender: Benjaman Pott in Treatment: 26 Vital Signs Time Taken: 09:26 Temperature (F): 98.5 Height (in): 65 Pulse (bpm): 80 Weight (lbs): 156 Respiratory Rate (breaths/min): 18 Body Mass Index (BMI): 26 Blood Pressure (mmHg): 95/50 Reference Range: 80 - 120 mg / dl Electronic Signature(s) Signed: 03/09/2016 10:00:43 AM By: Gretta Cool, RN, [name], Kim RN, [name] Entered By: Gretta Cool, RN, [name], Kim on 03/04/2016 09:26:53

## 2016-03-11 ENCOUNTER — Ambulatory Visit: Payer: Medicare Other | Admitting: Surgery

## 2016-03-14 ENCOUNTER — Encounter: Payer: Medicare Other | Admitting: Surgery

## 2016-03-14 DIAGNOSIS — E11622 Type 2 diabetes mellitus with other skin ulcer: Secondary | ICD-10-CM | POA: Diagnosis not present

## 2016-03-14 NOTE — Progress Notes (Signed)
SHAKEYIA, KATKO ([id number]) Visit Report for 03/14/2016 Chief Complaint Document Details [name], [name]. Date of Service: 03/14/2016 2:45 PM Medical Record Number: [id number] Patient Account Number: [id number] Date of Birth/Sex: January 20, 1956 (60 y.o. Female) Treating RN: Cornell Barman Primary Care Physician: [name] Other Clinician: Referring Physician: [name] Treating Physician/Extender: Frann Rider in Treatment: 62 Information Obtained from: Patient Chief Complaint Patient presents to the wound care center for a consult due non healing wound. She has an open wound on her right upper back which she's had for about a year and she recently noticed a blister on her right lower extremity about 2 weeks ago. Electronic Signature(s) Signed: 03/14/2016 3:30:33 PM By: [name], [name] Entered By: [name] on 03/14/2016 15:30:33 Batte, [name] ([id number]) -------------------------------------------------------------------------------- HPI Details Patient Name: [name], [name]. Date of Service: 03/14/2016 2:45 PM Medical Record Number: [id number] Patient Account Number: [id number] Date of Birth/Sex: 1956/06/24 (60 y.o. Female) Treating RN: Cornell Barman Primary Care Physician: [name] Other Clinician: Referring Physician: [name] Treating Physician/Extender: Frann Rider in Treatment: 89 History of Present Illness Location: right upper back and right lower extremity wounds Quality: Patient reports No Pain. Severity: Patient states wound (s) are getting better. Duration: Patient has had the wound for > 3 months prior to seeking treatment at the wound center Timing: she thought it first occurred when she was using a heating pad about a year ago after back surgery. Context: The wound appeared gradually over time Modifying Factors: Patient is currently on renal dialysis and receives treatments 3 times weekly Associated Signs and Symptoms:  Patient reports having: surgery scheduled for this week for a AV fistula left arm. HPI Description: 60 year old patient who is known to be a diabetic and has end-stage renal disease has had several comorbidities including coronary artery disease, hypertension, hyperlipidemia, pancreatitis, anemia, previous history of hysterectomy, cholecystectomy, left-sided salivary gland excision, bilateral cataract surgery,Peritoneal dialysis catheter, hemodialysis catheter. the area on the back has also been caused by instant pressure she used to sleep on a recliner all day and has significant kyphoscoliosis. As far as the wound on her right lower extremity she's not sure how this blister occurred but she thought it has been there for about 2 weeks. No recent blood investigations available and no recent hemoglobin A1c. 12/30/2014 -- she is an assisted living facility but I believe the nurses that have not followed instructions as she had some cream applied on her back and there was a different dressing. Last week she's had a AV fistula placed on her left forearm. 01/13/2015 -- she has had some localized infection at the port site and she's been on doxycycline for this. 02/05/2015 - he has developed a small blister on her right lower extremity. 02/10/2015 -- she has developed another small blister on her right anterior chest wall in the area where she's had tape for her dialysis access. this may just be injury caused by a tape burn. 02/18/2015 -- no new blisters and she had a dermatology opinion and they have taken a biopsy of her skin. She also had a left brachial AV fistula placed this week. 03/03/2015 -- though we do not have the pathology report yet the patient says she has been put on prednisone because this skin disease is possibly to do with her immune system and her dermatologist is recommended this. 03/10/2015 -- she has developed a new blister which is quite large on her right lower extremity on  the shin.  05/22/2015 -- she did very well with her dressing but on trying to remove the [place] it peels away the newly healed skin. [name], [name] ([id number]) 06/05/2015 -- the patient was in the ER this past week for severe bleeding from the right nostril and had to have ENT see her and do a packing. They've also been holding her heparin during her dialysis. The patient is going back to ENT today. Other than that she has had no other significant issues. 06/19/2015 -- she is back on her blood thinners and continues to have her hemodialysis as before. 07/31/2015 -- a large blister has popped up again on her right lower extremity in the same place in the mid shin area in the anterior lateral compartment. 10/05/2015 -- last week she was admitted to the hospital for 4 days as she had a pneumonia and was treated with IV and oral antibiotics. Electronic Signature(s) Signed: 03/14/2016 3:30:40 PM By: [name], [name] Entered By: [name] on 03/14/2016 15:30:40 Brienza, [name] ([id number]) -------------------------------------------------------------------------------- Physical Exam Details Patient Name: [name], [name]. Date of Service: 03/14/2016 2:45 PM Medical Record Number: [id number] Patient Account Number: [id number] Date of Birth/Sex: 08/13/1955 (60 y.o. Female) Treating RN: Cornell Barman Primary Care Physician: [name] Other Clinician: Referring Physician: [name] Treating Physician/Extender: Frann Rider in Treatment: 22 Constitutional . Pulse regular. Respirations normal and unlabored. Afebrile. . Eyes Nonicteric. Reactive to light. Ears, Nose, Mouth, and Throat Lips, teeth, and gums WNL.Marland Kitchen Moist mucosa without lesions. Neck supple and nontender. No palpable supraclavicular or cervical adenopathy. Normal sized without goiter. Respiratory WNL. No retractions.. Cardiovascular Pedal Pulses WNL. No clubbing, cyanosis or edema. Lymphatic No  adneopathy. No adenopathy. No adenopathy. Musculoskeletal Adexa without tenderness or enlargement.. Digits and nails w/o clubbing, cyanosis, infection, petechiae, ischemia, or inflammatory conditions.. Integumentary (Hair, Skin) No suspicious lesions. No crepitus or fluctuance. No peri-wound warmth or erythema. No masses.Marland Kitchen Psychiatric Judgement and insight Intact.. No evidence of depression, anxiety, or agitation.. Notes the wound on the back has significant amount of epithelization and a minimal open areas. Electronic Signature(s) Signed: 03/14/2016 3:31:11 PM By: [name], [name] Entered By: [name] on 03/14/2016 15:31:10 Hillyard, [name] ([id number]) -------------------------------------------------------------------------------- Physician Orders Details Patient Name: [name] Date of Service: 03/14/2016 2:45 PM Medical Record Number: [id number] Patient Account Number: [id number] Date of Birth/Sex: 1956-01-06 (60 y.o. Female) Treating RN: Cornell Barman Primary Care Physician: [name] Other Clinician: Referring Physician: [name] Treating Physician/Extender: Frann Rider in Treatment: 59 Verbal / Phone Orders: Yes Clinician: Cornell Barman Read Back and Verified: No Diagnosis Coding Wound Cleansing Wound #1 Right Back o Clean wound with Normal Saline. o May Shower, gently pat wound dry prior to applying new dressing. - PATIENT MAY SHOWER AND [place] WOUND WITH SOAP AND WATER WITH CNA ASSISTANCE AND PAT WOUND DRY AND SNF NURSE TO APPLY NEW DRESSING RIGHT AFTERWARDS Skin Barriers/Peri-Wound Care Wound #1 Right Back o Skin Prep Primary Wound Dressing Wound #1 Right Back o Other: - Tritec Foam - Please wet prior to removing; Cover with [facility] Dressing Dressing Change Frequency Wound #1 Right Back o Other: - Change when soiled Follow-up Appointments Wound #1 Right Back o Return Appointment in 1 week. Electronic Signature(s) Signed:  03/14/2016 3:52:04 PM By: [name], [name] Signed: 03/14/2016 4:33:55 PM By: Gretta Cool RN, [name], Kim RN, [name] Entered By: Gretta Cool, RN, [name], Kim on 03/14/2016 15:20:28 Preusser, [name] ([id number]) -------------------------------------------------------------------------------- Problem List Details Patient Name: TJUANA, FARELL  S. Date of Service: 03/14/2016 2:45 PM Medical Record Number: [id number] Patient Account Number: [id number] Date of Birth/Sex: Jan 15, 1956 (60 y.o. Female) Treating RN: Cornell Barman Primary Care Physician: [name] Other Clinician: Referring Physician: [name] Treating Physician/Extender: Frann Rider in Treatment: 71 Active Problems ICD-10 Encounter Code Description Active Date Diagnosis E11.622 Type 2 diabetes mellitus with other skin ulcer 12/23/2014 Yes L89.112 Pressure ulcer of right upper back, stage 2 12/23/2014 Yes S80.821A Blister (nonthermal), right lower leg, initial encounter 12/23/2014 Yes N18.6 End stage renal disease 12/23/2014 Yes Inactive Problems Resolved Problems Electronic Signature(s) Signed: 03/14/2016 3:30:26 PM By: [name], [name] Entered By: [name] on 03/14/2016 15:30:25 Madia, [name] ([id number]) -------------------------------------------------------------------------------- Progress Note Details Patient Name: Goodspeed, Mariluz S. Date of Service: 03/14/2016 2:45 PM Medical Record Number: [id number] Patient Account Number: [id number] Date of Birth/Sex: May 22, 1956 (60 y.o. Female) Treating RN: Cornell Barman Primary Care Physician: [name] Other Clinician: Referring Physician: [name] Treating Physician/Extender: Frann Rider in Treatment: 26 Subjective Chief Complaint Information obtained from Patient Patient presents to the wound care center for a consult due non healing wound. She has an open wound on her right upper back which she's had for about a year and she recently noticed a blister on her right  lower extremity about 2 weeks ago. History of Present Illness (HPI) The following HPI elements were documented for the patient's wound: Location: right upper back and right lower extremity wounds Quality: Patient reports No Pain. Severity: Patient states wound (s) are getting better. Duration: Patient has had the wound for > 3 months prior to seeking treatment at the wound center Timing: she thought it first occurred when she was using a heating pad about a year ago after back surgery. Context: The wound appeared gradually over time Modifying Factors: Patient is currently on renal dialysis and receives treatments 3 times weekly Associated Signs and Symptoms: Patient reports having: surgery scheduled for this week for a AV fistula left arm. 60 year old patient who is known to be a diabetic and has end-stage renal disease has had several comorbidities including coronary artery disease, hypertension, hyperlipidemia, pancreatitis, anemia, previous history of hysterectomy, cholecystectomy, left-sided salivary gland excision, bilateral cataract surgery,Peritoneal dialysis catheter, hemodialysis catheter. the area on the back has also been caused by instant pressure she used to sleep on a recliner all day and has significant kyphoscoliosis. As far as the wound on her right lower extremity she's not sure how this blister occurred but she thought it has been there for about 2 weeks. No recent blood investigations available and no recent hemoglobin A1c. 12/30/2014 -- she is an assisted living facility but I believe the nurses that have not followed instructions as she had some cream applied on her back and there was a different dressing. Last week she's had a AV fistula placed on her left forearm. 01/13/2015 -- she has had some localized infection at the port site and she's been on doxycycline for this. 02/05/2015 - he has developed a small blister on her right lower extremity. 02/10/2015 -- she has  developed another small blister on her right anterior chest wall in the area where she's had tape for her dialysis access. this may just be injury caused by a tape burn. 02/18/2015 -- no new blisters and she had a dermatology opinion and they have taken a biopsy of her skin. Trier, [name] ([id number]) She also had a left brachial AV fistula placed this week. 03/03/2015 -- though we do not have  the pathology report yet the patient says she has been put on prednisone because this skin disease is possibly to do with her immune system and her dermatologist is recommended this. 03/10/2015 -- she has developed a new blister which is quite large on her right lower extremity on the shin. 05/22/2015 -- she did very well with her dressing but on trying to remove the [place] it peels away the newly healed skin. 06/05/2015 -- the patient was in the ER this past week for severe bleeding from the right nostril and had to have ENT see her and do a packing. They've also been holding her heparin during her dialysis. The patient is going back to ENT today. Other than that she has had no other significant issues. 06/19/2015 -- she is back on her blood thinners and continues to have her hemodialysis as before. 07/31/2015 -- a large blister has popped up again on her right lower extremity in the same place in the mid shin area in the anterior lateral compartment. 10/05/2015 -- last week she was admitted to the hospital for 4 days as she had a pneumonia and was treated with IV and oral antibiotics. Objective Constitutional Pulse regular. Respirations normal and unlabored. Afebrile. Vitals Time Taken: 2:53 PM, Height: 65 in, Weight: 156 lbs, BMI: 26, Temperature: 98.2 F, Pulse: 77 bpm, Respiratory Rate: 18 breaths/min, Blood Pressure: 127/53 mmHg. Eyes Nonicteric. Reactive to light. Ears, Nose, Mouth, and Throat Lips, teeth, and gums WNL.Marland Kitchen Moist mucosa without lesions. Neck supple and nontender. No  palpable supraclavicular or cervical adenopathy. Normal sized without goiter. Respiratory WNL. No retractions.. Cardiovascular Pedal Pulses WNL. No clubbing, cyanosis or edema. Lymphatic No adneopathy. No adenopathy. No adenopathy. [name] ([id number]) Musculoskeletal Adexa without tenderness or enlargement.. Digits and nails w/o clubbing, cyanosis, infection, petechiae, ischemia, or inflammatory conditions.Marland Kitchen Psychiatric Judgement and insight Intact.. No evidence of depression, anxiety, or agitation.. General Notes: the wound on the back has significant amount of epithelization and a minimal open areas. Integumentary (Hair, Skin) No suspicious lesions. No crepitus or fluctuance. No peri-wound warmth or erythema. No masses.. Wound #1 status is Open. Original cause of wound was Blister. The wound is located on the Right Back. The wound measures 1.6cm length x 2cm width x 0.1cm depth; 2.513cm^2 area and 0.251cm^3 volume. The wound is limited to skin breakdown. There is no tunneling or undermining noted. There is a medium amount of sanguinous drainage noted. The wound margin is flat and intact. There is large (67-100%) red, friable granulation within the wound bed. There is no necrotic tissue within the wound bed. The periwound skin appearance exhibited: Scarring, Moist, Ecchymosis. The periwound skin appearance did not exhibit: Callus, Crepitus, Excoriation, Fluctuance, Friable, Induration, Localized Edema, Rash, Dry/Scaly, Maceration, Atrophie Blanche, Cyanosis, Hemosiderin Staining, Mottled, Pallor, Rubor, Erythema. Periwound temperature was noted as No Abnormality. Assessment Active Problems ICD-10 E11.622 - Type 2 diabetes mellitus with other skin ulcer L89.112 - Pressure ulcer of right upper back, stage 2 S80.821A - Blister (nonthermal), right lower leg, initial encounter N18.6 - End stage renal disease Plan Wound Cleansing: Wound #1 Right Back: Clean wound with Normal  Saline. May Shower, gently pat wound dry prior to applying new dressing. - PATIENT MAY SHOWER AND [place] WITH SOAP AND WATER WITH CNA ASSISTANCE AND PAT WOUND DRY AND SNF NURSE TO APPLY NEW DRESSING RIGHT AFTERWARDS EMILA, VEITH ([id number]) Skin Barriers/Peri-Wound Care: Wound #1 Right Back: Skin Prep Primary Wound Dressing: Wound #1 Right Back: Other: - Tritec  Foam - Please wet prior to removing; Cover with [facility] Dressing Dressing Change Frequency: Wound #1 Right Back: Other: - Change when soiled Follow-up Appointments: Wound #1 Right Back: Return Appointment in 1 week. In view of the fragile skin I am going to try Tritec Ultra silver foam, lasted to change it only twice a week if possible. Electronic Signature(s) Signed: 03/14/2016 3:32:02 PM By: [name], [name] Entered By: [name] on 03/14/2016 15:32:02 Velardi, [name] ([id number]) -------------------------------------------------------------------------------- SuperBill Details Patient Name: [name] Date of Service: 03/14/2016 Medical Record Number: [id number] Patient Account Number: [id number] Date of Birth/Sex: Dec 25, 1955 (60 y.o. Female) Treating RN: Cornell Barman Primary Care Physician: [name] Other Clinician: Referring Physician: [name] Treating Physician/Extender: Frann Rider in Treatment: 63 Diagnosis Coding ICD-10 Codes Code Description E11.622 Type 2 diabetes mellitus with other skin ulcer L89.112 Pressure ulcer of right upper back, stage 2 S80.821A Blister (nonthermal), right lower leg, initial encounter N18.6 End stage renal disease Facility Procedures CPT4 Code: [id number] Description: [phone] - WOUND CARE VISIT-LEV 2 EST PT Modifier: Quantity: 1 Physician Procedures CPT4 Code: [id number] Description: [id number] - WC PHYS LEVEL 3 - EST PT ICD-10 Description Diagnosis E11.622 Type 2 diabetes mellitus with other skin ulcer L89.112 Pressure ulcer of right upper back,  stage 2 S80.821A Blister (nonthermal), right lower leg, initial enc N18.6 End  stage renal disease Modifier: ounter Quantity: 1 Electronic Signature(s) Signed: 03/14/2016 3:32:17 PM By: [name], [name] Entered By: [name] on 03/14/2016 15:32:17

## 2016-03-14 NOTE — Progress Notes (Signed)
ANNALEI, MASSAQUOI ([id number]) Visit Report for 03/14/2016 Arrival Information Details [name], [name]. Date of Service: 03/14/2016 2:45 PM Medical Record Number: [id number] Patient Account Number: [id number] Date of Birth/Sex: 1956/06/15 (60 y.o. Female) Treating RN: Cornell Barman Primary Care Physician: [name] Other Clinician: Referring Physician: [name] Treating Physician/Extender: Frann Rider in Treatment: 71 Visit Information History Since Last Visit Added or deleted any medications: No Patient Arrived: Walker Any new allergies or adverse reactions: No Arrival Time: 14:52 Had a fall or experienced change in No Accompanied By: self activities of daily living that may affect Transfer Assistance: None risk of falls: Patient Identification Verified: Yes Signs or symptoms of abuse/neglect since last No Secondary Verification Process Yes visito Completed: Hospitalized since last visit: No Patient Requires Transmission- No Has Dressing in Place as Prescribed: Yes Based Precautions: Pain Present Now: No Patient Has Alerts: Yes Patient Alerts: Patient on Blood Thinner Electronic Signature(s) Signed: 03/14/2016 4:33:55 PM By: Gretta Cool, RN, [name], Kim RN, [name] Entered By: Gretta Cool, RN, [name], Kim on 03/14/2016 14:52:53 Brodrick, [name] ([id number]) -------------------------------------------------------------------------------- Clinic Level of Care Assessment Details [name]. Date of Service: 03/14/2016 2:45 PM Medical Record Number: [id number] Patient Account Number: [id number] Date of Birth/Sex: 08/23/1955 (60 y.o. Female) Treating RN: Cornell Barman Primary Care Physician: [name] Other Clinician: Referring Physician: [name] Treating Physician/Extender: Frann Rider in Treatment: 72 Clinic Level of Care Assessment Items TOOL 4 Quantity Score []  - Use when only an EandM is performed on FOLLOW-UP visit 0 ASSESSMENTS -  Nursing Assessment / Reassessment []  - Reassessment of Co-morbidities (includes updates in patient status) 0 X - Reassessment of Adherence to Treatment Plan 1 5 ASSESSMENTS - Wound and Skin Assessment / Reassessment X - Simple Wound Assessment / Reassessment - one wound 1 5 []  - Complex Wound Assessment / Reassessment - multiple wounds 0 []  - Dermatologic / Skin Assessment (not related to wound area) 0 ASSESSMENTS - Focused Assessment []  - Circumferential Edema Measurements - multi extremities 0 []  - Nutritional Assessment / Counseling / Intervention 0 []  - Lower Extremity Assessment (monofilament, tuning fork, pulses) 0 []  - Peripheral Arterial Disease Assessment (using hand held doppler) 0 ASSESSMENTS - Ostomy and/or Continence Assessment and Care []  - Incontinence Assessment and Management 0 []  - Ostomy Care Assessment and Management (repouching, etc.) 0 PROCESS - Coordination of Care X - Simple Patient / Family Education for ongoing care 1 15 []  - Complex (extensive) Patient / Family Education for ongoing care 0 []  - Staff obtains Programmer, systems, Records, Test Results / Process Orders 0 []  - Staff telephones HHA, Nursing Homes / Clarify orders / etc 0 []  - Routine Transfer to another Facility (non-emergent condition) 0 [name], [name] ([id number]) []  - Routine Hospital Admission (non-emergent condition) 0 []  - New Admissions / Biomedical engineer / Ordering NPWT, Apligraf, etc. 0 []  - Emergency Hospital Admission (emergent condition) 0 X - Simple Discharge Coordination 1 10 []  - Complex (extensive) Discharge Coordination 0 PROCESS - Special Needs []  - Pediatric / Minor Patient Management 0 []  - Isolation Patient Management 0 []  - Hearing / Language / Visual special needs 0 []  - Assessment of Community assistance (transportation, D/C planning, etc.) 0 []  - Additional assistance / Altered mentation 0 []  - Support Surface(s) Assessment (bed, cushion, seat, etc.) 0 INTERVENTIONS -  Wound Cleansing / Measurement X - Simple Wound Cleansing - one wound 1 5 []  - Complex Wound Cleansing - multiple wounds 0 X - Wound  Imaging (photographs - any number of wounds) 1 5 []  - Wound Tracing (instead of photographs) 0 X - Simple Wound Measurement - one wound 1 5 []  - Complex Wound Measurement - multiple wounds 0 INTERVENTIONS - Wound Dressings X - Small Wound Dressing one or multiple wounds 1 10 []  - Medium Wound Dressing one or multiple wounds 0 []  - Large Wound Dressing one or multiple wounds 0 []  - Application of Medications - topical 0 []  - Application of Medications - injection 0 INTERVENTIONS - Miscellaneous []  - External ear exam 0 [name], [name] ([id number]) []  - Specimen Collection (cultures, biopsies, blood, body fluids, etc.) 0 []  - Specimen(s) / Culture(s) sent or taken to Lab for analysis 0 []  - Patient Transfer (multiple staff / Harrel Lemon Lift / Similar devices) 0 []  - Simple Staple / Suture removal (25 or less) 0 []  - Complex Staple / Suture removal (26 or more) 0 []  - Hypo / Hyperglycemic Management (close monitor of Blood Glucose) 0 []  - Ankle / Brachial Index (ABI) - do not check if billed separately 0 X - Vital Signs 1 5 Has the patient been seen at the hospital within the last three years: Yes Total Score: 65 Level Of Care: New/Established - Level 2 Electronic Signature(s) Signed: 03/14/2016 4:33:55 PM By: Gretta Cool, RN, [name], Kim RN, [name] Entered By: Gretta Cool, RN, [name], Kim on 03/14/2016 15:20:50 Brandstetter, [name] ([id number]) -------------------------------------------------------------------------------- Encounter Discharge Information Details [name]. Date of Service: 03/14/2016 2:45 PM Medical Record Number: [id number] Patient Account Number: [id number] Date of Birth/Sex: 09/23/1955 (60 y.o. Female) Treating RN: Cornell Barman Primary Care Physician: [name] Other Clinician: Referring Physician: [name] Treating Physician/Extender:  Frann Rider in Treatment: 47 Encounter Discharge Information Items Discharge Pain Level: 0 Discharge Condition: Stable Ambulatory Status: Walker Discharge Destination: Nursing Home Transportation: Private Auto Accompanied By: self Schedule Follow-up Appointment: Yes Medication Reconciliation completed Yes and provided to Patient/Care Zayden Hahne: Provided on Clinical Summary of Care: 03/14/2016 Form Type Recipient Paper Patient LD Electronic Signature(s) Signed: 03/14/2016 3:26:34 PM By: Ruthine Dose Entered By: Ruthine Dose on 03/14/2016 15:26:34 [name], [name] ([id number]) -------------------------------------------------------------------------------- Multi Wound Chart Details [name]. Date of Service: 03/14/2016 2:45 PM Medical Record Number: [id number] Patient Account Number: [id number] Date of Birth/Sex: 02-04-1956 (60 y.o. Female) Treating RN: Cornell Barman Primary Care Physician: [name] Other Clinician: Referring Physician: [name] Treating Physician/Extender: Frann Rider in Treatment: 47 Vital Signs Height(in): 65 Pulse(bpm): 77 Weight(lbs): 156 Blood Pressure 127/53 (mmHg): Body Mass Index(BMI): 26 Temperature(F): 98.2 Respiratory Rate 18 (breaths/min): Photos: [N/A:N/A] Wound Location: Right Back N/A N/A Wounding Event: Blister N/A N/A Primary Etiology: Pressure Ulcer N/A N/A Comorbid History: Glaucoma, Anemia, N/A N/A Coronary Artery Disease, Hypertension, Type II Diabetes, End Stage Renal Disease, Osteoarthritis, Neuropathy Date Acquired: 10/19/2013 N/A N/A Weeks of Treatment: 89 N/A N/A Wound Status: Open N/A N/A Measurements L x W x D 1.6x2x0.1 N/A N/A (cm) Area (cm) : 2.513 N/A N/A Volume (cm) : 0.251 N/A N/A % Reduction in Area: 93.50% N/A N/A % Reduction in Volume: 93.50% N/A N/A Classification: Category/Stage II N/A N/A Exudate Amount: Medium N/A N/A Exudate Type: Sanguinous N/A  N/A Exudate Color: red N/A N/A Wound Margin: Flat and Intact N/A N/A [name], [name] ([id number]) Granulation Amount: Large (67-100%) N/A N/A Granulation Quality: Red, Friable N/A N/A Necrotic Amount: None Present (0%) N/A N/A Exposed Structures: Fascia: No N/A N/A Fat: No Tendon: No Muscle: No Joint: No Bone: No  Limited to Skin Breakdown Epithelialization: Small (1-33%) N/A N/A Periwound Skin Texture: Scarring: Yes N/A N/A Edema: No Excoriation: No Induration: No Callus: No Crepitus: No Fluctuance: No Friable: No Rash: No Periwound Skin Moist: Yes N/A N/A Moisture: Maceration: No Dry/Scaly: No Periwound Skin Color: Ecchymosis: Yes N/A N/A Atrophie Blanche: No Cyanosis: No Erythema: No Hemosiderin Staining: No Mottled: No Pallor: No Rubor: No Temperature: No Abnormality N/A N/A Tenderness on No N/A N/A Palpation: Wound Preparation: Ulcer Cleansing: N/A N/A Rinsed/Irrigated with Saline Topical Anesthetic Applied: None Treatment Notes Electronic Signature(s) Signed: 03/14/2016 4:33:55 PM By: Gretta Cool, RN, [name], Kim RN, [name] Entered By: Gretta Cool, RN, [name], Kim on 03/14/2016 15:19:17 [name], [name] ([id number]) -------------------------------------------------------------------------------- Multi-Disciplinary Care Plan Details [name], FISS. Date of Service: 03/14/2016 2:45 PM Medical Record Number: [id number] Patient Account Number: [id number] Date of Birth/Sex: 1955-12-25 (60 y.o. Female) Treating RN: Cornell Barman Primary Care Physician: [name] Other Clinician: Referring Physician: [name] Treating Physician/Extender: Frann Rider in Treatment: 60 Active Inactive Abuse / Safety / Falls / Self Care Management Nursing Diagnoses: Potential for falls Goals: Patient will remain injury free Date Initiated: 12/23/2014 Goal Status: Active Patient/caregiver will verbalize understanding of skin care regimen Date Initiated: 12/23/2014 Goal  Status: Active Patient/caregiver will verbalize/demonstrate measures taken to prevent injury and/or falls Date Initiated: 12/23/2014 Goal Status: Active Patient/caregiver will verbalize/demonstrate understanding of what to do in case of emergency Date Initiated: 12/23/2014 Goal Status: Active Interventions: Assess fall risk on admission and as needed Assess: immobility, friction, shearing, incontinence upon admission and as needed Assess impairment of mobility on admission and as needed per policy Assess self care needs on admission and as needed Provide education on fall prevention Notes: Wound/Skin Impairment Nursing Diagnoses: Impaired tissue integrity Knowledge deficit related to ulceration/compromised skin integrity Goals: Patient/caregiver will verbalize understanding of skin care regimen [name], [name] ([id number]) Date Initiated: 12/23/2014 Goal Status: Active Ulcer/skin breakdown will heal within 14 weeks Date Initiated: 12/23/2014 Goal Status: Active Interventions: Assess patient/caregiver ability to obtain necessary supplies Assess patient/caregiver ability to perform ulcer/skin care regimen upon admission and as needed Assess ulceration(s) every visit Provide education on ulcer and skin care Treatment Activities: Skin care regimen initiated : 11/05/2015 Topical wound management initiated : 11/05/2015 Notes: Electronic Signature(s) Signed: 03/14/2016 4:33:55 PM By: Gretta Cool, RN, [name], Kim RN, [name] Entered By: Gretta Cool, RN, [name], Kim on 03/14/2016 15:19:02 [name], [name] ([id number]) -------------------------------------------------------------------------------- Pain Assessment Details [name]. Date of Service: 03/14/2016 2:45 PM Medical Record Number: [id number] Patient Account Number: [id number] Date of Birth/Sex: [birth year] (60 y.o. Female) Treating RN: Cornell Barman Primary Care Physician: [name] Other Clinician: Referring Physician: [name] Treating Physician/Extender: Frann Rider in Treatment: 63 Active Problems Location of Pain Severity and Description of Pain Patient Has Paino No Site Locations With Dressing Change: No Pain Management and Medication Current Pain Management: Electronic Signature(s) Signed: 03/14/2016 4:33:55 PM By: Gretta Cool, RN, [name], Kim RN, [name] Entered By: Gretta Cool, RN, [name], Kim on 03/14/2016 14:53:07 [name], [name] ([id number]) -------------------------------------------------------------------------------- Patient/Caregiver Education Details [name]. Date of Service: 03/14/2016 2:45 PM Medical Record Number: [id number] Patient Account Number: [id number] Date of Birth/Gender: 20-Nov-1955 (60 y.o. Female) Treating RN: Cornell Barman Primary Care Physician: [name] Other Clinician: Referring Physician: [name] Treating Physician/Extender: Frann Rider in Treatment: 57 Education Assessment Education Provided To: Patient Education Topics Provided Wound/Skin Impairment: Handouts: Caring for Your Ulcer Methods: Explain/Verbal, Printed Responses: State content correctly Electronic Signature(s) Signed: 03/14/2016  4:33:55 PM By: Gretta Cool, RN, [name], Kim RN, [name] Entered By: Gretta Cool, RN, [name], Kim on 03/14/2016 15:23:08 [name], [name] ([id number]) -------------------------------------------------------------------------------- Wound Assessment Details Patient Name: RONEISHA, URBAIN. Date of Service: 03/14/2016 2:45 PM Medical Record Number: [id number] Patient Account Number: [id number] Date of Birth/Sex: [birth year] (60 y.o. Female) Treating RN: Cornell Barman Primary Care Physician: [name] Other Clinician: Referring Physician: [name] Treating Physician/Extender: Frann Rider in Treatment: 63 Wound Status Wound Number: 1 Primary Pressure Ulcer Etiology: Wound Location: Right Back Wound Open Wounding Event: Blister Status: Date Acquired:  10/19/2013 Comorbid Glaucoma, Anemia, Coronary Artery Weeks Of Treatment: 63 History: Disease, Hypertension, Type II Clustered Wound: No Diabetes, End Stage Renal Disease, Osteoarthritis, Neuropathy Photos Wound Measurements Length: (cm) 1.6 Width: (cm) 2 Depth: (cm) 0.1 Area: (cm) 2.513 Volume: (cm) 0.251 % Reduction in Area: 93.5% % Reduction in Volume: 93.5% Epithelialization: Small (1-33%) Tunneling: No Undermining: No Wound Description Classification: Category/Stage II Wound Margin: Flat and Intact Exudate Amount: Medium Exudate Type: Sanguinous Exudate Color: red Foul Odor After Cleansing: No Wound Bed Granulation Amount: Large (67-100%) Exposed Structure Granulation Quality: Red, Friable Fascia Exposed: No Necrotic Amount: None Present (0%) Fat Layer Exposed: No Tendon Exposed: No [place], [place] S. ([id number]) Muscle Exposed: No Joint Exposed: No Bone Exposed: No Limited to Skin Breakdown Periwound Skin Texture Texture Color No Abnormalities Noted: No No Abnormalities Noted: No Callus: No Atrophie Blanche: No Crepitus: No Cyanosis: No Excoriation: No Ecchymosis: Yes Fluctuance: No Erythema: No Friable: No Hemosiderin Staining: No Induration: No Mottled: No Localized Edema: No Pallor: No Rash: No Rubor: No Scarring: Yes Temperature / Pain Moisture Temperature: No Abnormality No Abnormalities Noted: No Dry / Scaly: No Maceration: No Moist: Yes Wound Preparation Ulcer Cleansing: Rinsed/Irrigated with Saline Topical Anesthetic Applied: None Treatment Notes Wound #1 (Right Back) 1. Cleansed with: Clean wound with Normal Saline 4. Dressing Applied: Other dressing (specify in notes) 5. Secondary [place] Notes tritec silver foam Electronic Signature(s) Signed: 03/14/2016 4:33:55 PM By: Gretta Cool, RN, [name], Kim RN, [name] Entered By: Gretta Cool, RN, [name], Kim on 03/14/2016 14:59:40 [place]lett, [name]  ([id number]) -------------------------------------------------------------------------------- Vitals Details [name]. Date of Service: 03/14/2016 2:45 PM Medical Record Number: [id number] Patient Account Number: [id number] Date of Birth/Sex: 1956/02/02 (60 y.o. Female) Treating RN: Cornell Barman Primary Care Physician: [name] Other Clinician: Referring Physician: [name] Treating Physician/Extender: Frann Rider in Treatment: 53 Vital Signs Time Taken: 14:53 Temperature (F): 98.2 Height (in): 65 Pulse (bpm): 77 Weight (lbs): 156 Respiratory Rate (breaths/min): 18 Body Mass Index (BMI): 26 Blood Pressure (mmHg): 127/53 Reference Range: 80 - 120 mg / dl Electronic Signature(s) Signed: 03/14/2016 4:33:55 PM By: Gretta Cool, RN, [name], Kim RN, [name] Entered By: Gretta Cool, RN, [name], Kim on 03/14/2016 14:53:34

## 2016-03-25 ENCOUNTER — Encounter: Payer: Medicare Other | Attending: Surgery | Admitting: Surgery

## 2016-03-25 DIAGNOSIS — N186 End stage renal disease: Secondary | ICD-10-CM | POA: Insufficient documentation

## 2016-03-25 DIAGNOSIS — I2510 Atherosclerotic heart disease of native coronary artery without angina pectoris: Secondary | ICD-10-CM | POA: Insufficient documentation

## 2016-03-25 DIAGNOSIS — E785 Hyperlipidemia, unspecified: Secondary | ICD-10-CM | POA: Diagnosis not present

## 2016-03-25 DIAGNOSIS — I120 Hypertensive chronic kidney disease with stage 5 chronic kidney disease or end stage renal disease: Secondary | ICD-10-CM | POA: Insufficient documentation

## 2016-03-25 DIAGNOSIS — L89112 Pressure ulcer of right upper back, stage 2: Secondary | ICD-10-CM | POA: Diagnosis not present

## 2016-03-25 DIAGNOSIS — X58XXXA Exposure to other specified factors, initial encounter: Secondary | ICD-10-CM | POA: Insufficient documentation

## 2016-03-25 DIAGNOSIS — Z992 Dependence on renal dialysis: Secondary | ICD-10-CM | POA: Insufficient documentation

## 2016-03-25 DIAGNOSIS — E1122 Type 2 diabetes mellitus with diabetic chronic kidney disease: Secondary | ICD-10-CM | POA: Diagnosis not present

## 2016-03-25 DIAGNOSIS — D649 Anemia, unspecified: Secondary | ICD-10-CM | POA: Diagnosis not present

## 2016-03-25 DIAGNOSIS — S80821A Blister (nonthermal), right lower leg, initial encounter: Secondary | ICD-10-CM | POA: Insufficient documentation

## 2016-03-25 DIAGNOSIS — E11622 Type 2 diabetes mellitus with other skin ulcer: Secondary | ICD-10-CM | POA: Diagnosis present

## 2016-03-25 NOTE — Progress Notes (Addendum)
JENEVIE, RUTHVEN ([id number]) Visit Report for 03/25/2016 Chief Complaint Document Details [name], [name]. Date of Service: 03/25/2016 10:00 AM Medical Record Number: [id number] Patient Account Number: [id number] Date of Birth/Sex: July 11, 1956 (60 y.o. Female) Treating RN: Montey Hora Primary Care Physician: [name] Other Clinician: Referring Physician: [name] Treating Physician/Extender: Frann Rider in Treatment: 72 Information Obtained from: Patient Chief Complaint Patient presents to the wound care center for a consult due non healing wound. She has an open wound on her right upper back which she's had for about a year and she recently noticed a blister on her right lower extremity about 2 weeks ago. Electronic Signature(s) Signed: 03/25/2016 10:12:15 AM By: [name], [name] Entered By: [name] on 03/25/2016 10:12:15 Jourdan, [name] ([id number]) -------------------------------------------------------------------------------- HPI Details Patient Name: HADASAH, LAURANCE. Date of Service: 03/25/2016 10:00 AM Medical Record Number: [id number] Patient Account Number: [id number] Date of Birth/Sex: October 04, 1955 (60 y.o. Female) Treating RN: Montey Hora Primary Care Physician: [name] Other Clinician: Referring Physician: [name] Treating Physician/Extender: Frann Rider in Treatment: 20 History of Present Illness Location: right upper back and right lower extremity wounds Quality: Patient reports No Pain. Severity: Patient states wound (s) are getting better. Duration: Patient has had the wound for > 3 months prior to seeking treatment at the wound center Timing: she thought it first occurred when she was using a heating pad about a year ago after back surgery. Context: The wound appeared gradually over time Modifying Factors: Patient is currently on renal dialysis and receives treatments 3 times weekly Associated Signs and  Symptoms: Patient reports having: surgery scheduled for this week for a AV fistula left arm. HPI Description: 60 year old patient who is known to be a diabetic and has end-stage renal disease has had several comorbidities including coronary artery disease, hypertension, hyperlipidemia, pancreatitis, anemia, previous history of hysterectomy, cholecystectomy, left-sided salivary gland excision, bilateral cataract surgery,Peritoneal dialysis catheter, hemodialysis catheter. the area on the back has also been caused by instant pressure she used to sleep on a recliner all day and has significant kyphoscoliosis. As far as the wound on her right lower extremity she's not sure how this blister occurred but she thought it has been there for about 2 weeks. No recent blood investigations available and no recent hemoglobin A1c. 12/30/2014 -- she is an assisted living facility but I believe the nurses that have not followed instructions as she had some cream applied on her back and there was a different dressing. Last week she's had a AV fistula placed on her left forearm. 01/13/2015 -- she has had some localized infection at the port site and she's been on doxycycline for this. 02/05/2015 - he has developed a small blister on her right lower extremity. 02/10/2015 -- she has developed another small blister on her right anterior chest wall in the area where she's had tape for her dialysis access. this may just be injury caused by a tape burn. 02/18/2015 -- no new blisters and she had a dermatology opinion and they have taken a biopsy of her skin. She also had a left brachial AV fistula placed this week. 03/03/2015 -- though we do not have the pathology report yet the patient says she has been put on prednisone because this skin disease is possibly to do with her immune system and her dermatologist is recommended this. 03/10/2015 -- she has developed a new blister which is quite large on her right lower  extremity on the shin.  05/22/2015 -- she did very well with her dressing but on trying to remove the [place] it peels away the newly healed skin. MICHAEL, SHAMSI ([id number]) 06/05/2015 -- the patient was in the ER this past week for severe bleeding from the right nostril and had to have ENT see her and do a packing. They've also been holding her heparin during her dialysis. The patient is going back to ENT today. Other than that she has had no other significant issues. 06/19/2015 -- she is back on her blood thinners and continues to have her hemodialysis as before. 07/31/2015 -- a large blister has popped up again on her right lower extremity in the same place in the mid shin area in the anterior lateral compartment. 10/05/2015 -- last week she was admitted to the hospital for 4 days as she had a pneumonia and was treated with IV and oral antibiotics. Electronic Signature(s) Signed: 03/25/2016 10:12:32 AM By: [name], [name] Entered By: [name] on 03/25/2016 10:12:31 Grussing, [name] ([id number]) -------------------------------------------------------------------------------- Physical Exam Details Patient Name: Denicola, 74. Date of Service: 03/25/2016 10:00 AM Medical Record Number: [id number] Patient Account Number: [id number] Date of Birth/Sex: [birth year] (60 y.o. Female) Treating RN: Montey Hora Primary Care Physician: [name] Other Clinician: Referring Physician: [name] Treating Physician/Extender: Frann Rider in Treatment: 65 Constitutional . Pulse regular. Respirations normal and unlabored. Afebrile. . Eyes Nonicteric. Reactive to light. Ears, Nose, Mouth, and Throat Lips, teeth, and gums WNL.Marland Kitchen Moist mucosa without lesions. Neck supple and nontender. No palpable supraclavicular or cervical adenopathy. Normal sized without goiter. Respiratory WNL. No retractions.. Cardiovascular Pedal Pulses WNL. No clubbing, cyanosis or  edema. Lymphatic No adneopathy. No adenopathy. No adenopathy. Musculoskeletal Adexa without tenderness or enlargement.. Digits and nails w/o clubbing, cyanosis, infection, petechiae, ischemia, or inflammatory conditions.. Integumentary (Hair, Skin) No suspicious lesions. No crepitus or fluctuance. No peri-wound warmth or erythema. No masses.Marland Kitchen Psychiatric Judgement and insight Intact.. No evidence of depression, anxiety, or agitation.. Notes the wound is looking excellent and this is the best it has looked in [place] long while. It has a small area the size of a postage stamp which is still open Electronic Signature(s) Signed: 03/25/2016 10:13:08 AM By: [name], [name] Entered By: [name] on 03/25/2016 10:13:08 Nienow, [name] ([id number]) -------------------------------------------------------------------------------- Physician Orders Details Patient Name: [name] Date of Service: 03/25/2016 10:00 AM Medical Record Number: [id number] Patient Account Number: [id number] Date of Birth/Sex: Aug 24, 1955 (60 y.o. Female) Treating RN: Montey Hora Primary Care Physician: [name] Other Clinician: Referring Physician: [name] Treating Physician/Extender: Frann Rider in Treatment: 74 Verbal / Phone Orders: Yes Clinician: Montey Hora Read Back and Verified: Yes Diagnosis Coding ICD-10 Coding Code Description E11.622 Type 2 diabetes mellitus with other skin ulcer L89.112 Pressure ulcer of right upper back, stage 2 S80.821A Blister (nonthermal), right lower leg, initial encounter N18.6 End stage renal disease Wound Cleansing Wound #1 Right Back o Clean wound with Normal Saline. o May Shower, gently pat wound dry prior to applying new dressing. - PATIENT MAY SHOWER AND [place] WOUND WITH SOAP AND WATER WITH CNA ASSISTANCE AND PAT WOUND DRY AND SNF NURSE TO APPLY NEW DRESSING RIGHT AFTERWARDS Skin Barriers/Peri-Wound Care Wound #1 Right Back o Skin  Prep Primary Wound Dressing Wound #1 Right Back o Other: - Tritec Foam - Please wet prior to removing; Cover with [facility] Dressing Dressing Change Frequency Wound #1 Right Back o Other: - Change when soiled Follow-up Appointments Wound #  1 Right Back o Return Appointment in 1 week. Electronic Signature(s) Signed: 03/25/2016 1:43:20 PM By: [name], [name] Signed: 03/25/2016 2:52:21 PM By: Regan Lemming [name], RN Mccaughan, [name] ([id number]) Entered By: Regan Lemming on 03/25/2016 10:15:52 Branam, [name] ([id number]) -------------------------------------------------------------------------------- Problem List Details Patient Name: [place], SANDOVAL. Date of Service: 03/25/2016 10:00 AM Medical Record Number: [id number] Patient Account Number: [id number] Date of Birth/Sex: 05-Jul-1956 (60 y.o. Female) Treating RN: Montey Hora Primary Care Physician: [name] Other Clinician: Referring Physician: [name] Treating Physician/Extender: Frann Rider in Treatment: 72 Active Problems ICD-10 Encounter Code Description Active Date Diagnosis E11.622 Type 2 diabetes mellitus with other skin ulcer 12/23/2014 Yes L89.112 Pressure ulcer of right upper back, stage 2 12/23/2014 Yes S80.821A Blister (nonthermal), right lower leg, initial encounter 12/23/2014 Yes N18.6 End stage renal disease 12/23/2014 Yes Inactive Problems Resolved Problems Electronic Signature(s) Signed: 03/25/2016 10:12:07 AM By: [name], [name] Entered By: [name] on 03/25/2016 10:12:07 Kendall, [name] ([id number]) -------------------------------------------------------------------------------- Progress Note Details Patient Name: Lamadrid, [name] S. Date of Service: 03/25/2016 10:00 AM Medical Record Number: [id number] Patient Account Number: [id number] Date of Birth/Sex: 03/23/1956 (60 y.o. Female) Treating RN: Montey Hora Primary Care Physician: [name] Other Clinician: Referring Physician:  [name] Treating Physician/Extender: Frann Rider in Treatment: 10 Subjective Chief Complaint Information obtained from Patient Patient presents to the wound care center for a consult due non healing wound. She has an open wound on her right upper back which she's had for about a year and she recently noticed a blister on her right lower extremity about 2 weeks ago. History of Present Illness (HPI) The following HPI elements were documented for the patient's wound: Location: right upper back and right lower extremity wounds Quality: Patient reports No Pain. Severity: Patient states wound (s) are getting better. Duration: Patient has had the wound for > 3 months prior to seeking treatment at the wound center Timing: she thought it first occurred when she was using a heating pad about a year ago after back surgery. Context: The wound appeared gradually over time Modifying Factors: Patient is currently on renal dialysis and receives treatments 3 times weekly Associated Signs and Symptoms: Patient reports having: surgery scheduled for this week for a AV fistula left arm. 60 year old patient who is known to be a diabetic and has end-stage renal disease has had several comorbidities including coronary artery disease, hypertension, hyperlipidemia, pancreatitis, anemia, previous history of hysterectomy, cholecystectomy, left-sided salivary gland excision, bilateral cataract surgery,Peritoneal dialysis catheter, hemodialysis catheter. the area on the back has also been caused by instant pressure she used to sleep on a recliner all day and has significant kyphoscoliosis. As far as the wound on her right lower extremity she's not sure how this blister occurred but she thought it has been there for about 2 weeks. No recent blood investigations available and no recent hemoglobin A1c. 12/30/2014 -- she is an assisted living facility but I believe the nurses that have not followed  instructions as she had some cream applied on her back and there was a different dressing. Last week she's had a AV fistula placed on her left forearm. 01/13/2015 -- she has had some localized infection at the port site and she's been on doxycycline for this. 02/05/2015 - he has developed a small blister on her right lower extremity. 02/10/2015 -- she has developed another small blister on her right anterior chest wall in the area where she's had tape for her dialysis access.  this may just be injury caused by a tape burn. 02/18/2015 -- no new blisters and she had a dermatology opinion and they have taken a biopsy of her skin. Tobon, [name] ([id number]) She also had a left brachial AV fistula placed this week. 03/03/2015 -- though we do not have the pathology report yet the patient says she has been put on prednisone because this skin disease is possibly to do with her immune system and her dermatologist is recommended this. 03/10/2015 -- she has developed a new blister which is quite large on her right lower extremity on the shin. 05/22/2015 -- she did very well with her dressing but on trying to remove the [place] it peels away the newly healed skin. 06/05/2015 -- the patient was in the ER this past week for severe bleeding from the right nostril and had to have ENT see her and do a packing. They've also been holding her heparin during her dialysis. The patient is going back to ENT today. Other than that she has had no other significant issues. 06/19/2015 -- she is back on her blood thinners and continues to have her hemodialysis as before. 07/31/2015 -- a large blister has popped up again on her right lower extremity in the same place in the mid shin area in the anterior lateral compartment. 10/05/2015 -- last week she was admitted to the hospital for 4 days as she had a pneumonia and was treated with IV and oral antibiotics. Objective Constitutional Pulse regular. Respirations  normal and unlabored. Afebrile. Vitals Time Taken: 10:01 AM, Height: 65 in, Weight: 156 lbs, BMI: 26, Temperature: 98.4 F, Pulse: 81 bpm, Respiratory Rate: 18 breaths/min, Blood Pressure: 156/55 mmHg. Eyes Nonicteric. Reactive to light. Ears, Nose, Mouth, and Throat Lips, teeth, and gums WNL.Marland Kitchen Moist mucosa without lesions. Neck supple and nontender. No palpable supraclavicular or cervical adenopathy. Normal sized without goiter. Respiratory WNL. No retractions.. Cardiovascular Pedal Pulses WNL. No clubbing, cyanosis or edema. Lymphatic No adneopathy. No adenopathy. No adenopathy. [name] ([id number]) Musculoskeletal Adexa without tenderness or enlargement.. Digits and nails w/o clubbing, cyanosis, infection, petechiae, ischemia, or inflammatory conditions.Marland Kitchen Psychiatric Judgement and insight Intact.. No evidence of depression, anxiety, or agitation.. General Notes: the wound is looking excellent and this is the best it has looked in [place] long while. It has a small area the size of a postage stamp which is still open Integumentary (Hair, Skin) No suspicious lesions. No crepitus or fluctuance. No peri-wound warmth or erythema. No masses.. Wound #1 status is Open. Original cause of wound was Blister. The wound is located on the Right Back. The wound measures 1.6cm length x 1.5cm width x 0.1cm depth; 1.885cm^2 area and 0.188cm^3 volume. The wound is limited to skin breakdown. There is no tunneling or undermining noted. There is a medium amount of sanguinous drainage noted. The wound margin is flat and intact. There is large (67-100%) red, friable granulation within the wound bed. There is no necrotic tissue within the wound bed. The periwound skin appearance exhibited: Scarring, Moist, Ecchymosis. The periwound skin appearance did not exhibit: Callus, Crepitus, Excoriation, Fluctuance, Friable, Induration, Localized Edema, Rash, Dry/Scaly, Maceration, Atrophie Blanche, Cyanosis,  Hemosiderin Staining, Mottled, Pallor, Rubor, Erythema. Periwound temperature was noted as No Abnormality. Assessment Active Problems ICD-10 E11.622 - Type 2 diabetes mellitus with other skin ulcer L89.112 - Pressure ulcer of right upper back, stage 2 S80.821A - Blister (nonthermal), right lower leg, initial encounter N18.6 - End stage renal disease Plan Wound Cleansing: Wound #  1 Right Back: Clean wound with Normal Saline. May Shower, gently pat wound dry prior to applying new dressing. - PATIENT MAY SHOWER AND [place] WITH SOAP AND WATER WITH CNA ASSISTANCE AND PAT WOUND DRY AND SNF Hitson, Kayde S. ([id number]) NURSE TO APPLY NEW DRESSING RIGHT AFTERWARDS Skin Barriers/Peri-Wound Care: Wound #1 Right Back: Skin Prep Primary Wound Dressing: Wound #1 Right Back: Other: - Tritec Foam - Please wet prior to removing; Cover with [facility] Dressing Dressing Change Frequency: Wound #1 Right Back: Other: - Change when soiled Follow-up Appointments: Wound #1 Right Back: Return Appointment in 1 week. In view of the fragile skin I am going to try Tritec Ultra silver foam, lasted to change it only twice a week if possible. last week she's had an excellent result with this. Electronic Signature(s) Signed: 03/25/2016 2:38:49 PM By: [name], [name] Previous Signature: 03/25/2016 10:13:30 AM Version By: [name], [name] Entered By: [name] on 03/25/2016 14:38:49 Mermelstein, [name] ([id number]) -------------------------------------------------------------------------------- SuperBill Details Patient Name: [name] Date of Service: 03/25/2016 Medical Record Number: [id number] Patient Account Number: [id number] Date of Birth/Sex: [birth year] (60 y.o. Female) Treating RN: Montey Hora Primary Care Physician: [name] Other Clinician: Referring Physician: [name] Treating Physician/Extender: Frann Rider in Treatment: 80 Diagnosis Coding ICD-10  Codes Code Description E11.622 Type 2 diabetes mellitus with other skin ulcer L89.112 Pressure ulcer of right upper back, stage 2 S80.821A Blister (nonthermal), right lower leg, initial encounter N18.6 End stage renal disease Facility Procedures CPT4 Code: [id number] Description: [phone] - WOUND CARE VISIT-LEV 2 EST PT Modifier: Quantity: 1 Physician Procedures CPT4 Code: [id number] Description: [id number] - WC PHYS LEVEL 3 - EST PT ICD-10 Description Diagnosis E11.622 Type 2 diabetes mellitus with other skin ulcer L89.112 Pressure ulcer of right upper back, stage 2 S80.821A Blister (nonthermal), right lower leg, initial enc N18.6 End  stage renal disease Modifier: ounter Quantity: 1 Electronic Signature(s) Signed: 03/25/2016 2:39:03 PM By: [name], [name] Previous Signature: 03/25/2016 11:57:00 AM Version By: Montey Hora Previous Signature: 03/25/2016 1:43:20 PM Version By: [name], [name] Previous Signature: 03/25/2016 10:13:43 AM Version By: [name], [name] Entered By: [name] on 03/25/2016 14:39:01

## 2016-03-26 NOTE — Progress Notes (Signed)
RAINN, IMPARATO ([id number]) Visit Report for 03/25/2016 Arrival Information Details [name], [name]. Date of Service: 03/25/2016 10:00 AM Medical Record Number: [id number] Patient Account Number: [id number] Date of Birth/Sex: 03/10/1956 (60 y.o. Female) Treating RN: Montey Hora Primary Care Physician: [name] Other Clinician: Referring Physician: [name] Treating Physician/Extender: Frann Rider in Treatment: 23 Visit Information History Since Last Visit Added or deleted any medications: No Patient Arrived: [place] Any new allergies or adverse reactions: No Arrival Time: 10:00 Had a fall or experienced change in No Accompanied By: SELF activities of daily living that may affect Transfer Assistance: None risk of falls: Patient Identification Verified: Yes Signs or symptoms of abuse/neglect since last No Secondary Verification Process Yes visito Completed: Hospitalized since last visit: No Patient Requires Transmission- No Pain Present Now: No Based Precautions: Patient Has Alerts: Yes Patient Alerts: Patient on Blood Thinner Electronic Signature(s) Signed: 03/25/2016 4:21:46 PM By: Montey Hora Entered By: Montey Hora on 03/25/2016 10:18:29 Storr, [name] ([id number]) -------------------------------------------------------------------------------- Clinic Level of Care Assessment Details [name]. Date of Service: 03/25/2016 10:00 AM Medical Record Number: [id number] Patient Account Number: [id number] Date of Birth/Sex: 1956-05-11 (60 y.o. Female) Treating RN: Montey Hora Primary Care Physician: [name] Other Clinician: Referring Physician: [name] Treating Physician/Extender: Frann Rider in Treatment: 67 Clinic Level of Care Assessment Items TOOL 4 Quantity Score []  - Use when only an EandM is performed on FOLLOW-UP visit 0 ASSESSMENTS - Nursing Assessment / Reassessment X - Reassessment of  Co-morbidities (includes updates in patient status) 1 10 X - Reassessment of Adherence to Treatment Plan 1 5 ASSESSMENTS - Wound and Skin Assessment / Reassessment X - Simple Wound Assessment / Reassessment - one wound 1 5 []  - Complex Wound Assessment / Reassessment - multiple wounds 0 []  - Dermatologic / Skin Assessment (not related to wound area) 0 ASSESSMENTS - Focused Assessment []  - Circumferential Edema Measurements - multi extremities 0 []  - Nutritional Assessment / Counseling / Intervention 0 []  - Lower Extremity Assessment (monofilament, tuning fork, pulses) 0 []  - Peripheral Arterial Disease Assessment (using hand held doppler) 0 ASSESSMENTS - Ostomy and/or Continence Assessment and Care []  - Incontinence Assessment and Management 0 []  - Ostomy Care Assessment and Management (repouching, etc.) 0 PROCESS - Coordination of Care X - Simple Patient / Family Education for ongoing care 1 15 []  - Complex (extensive) Patient / Family Education for ongoing care 0 []  - Staff obtains Programmer, systems, Records, Test Results / Process Orders 0 []  - Staff telephones HHA, Nursing Homes / Clarify orders / etc 0 []  - Routine Transfer to another Facility (non-emergent condition) 0 Granquist, Clarity S. ([id number]) []  - Routine Hospital Admission (non-emergent condition) 0 []  - New Admissions / Biomedical engineer / Ordering NPWT, Apligraf, etc. 0 []  - Emergency Hospital Admission (emergent condition) 0 X - Simple Discharge Coordination 1 10 []  - Complex (extensive) Discharge Coordination 0 PROCESS - Special Needs []  - Pediatric / Minor Patient Management 0 []  - Isolation Patient Management 0 []  - Hearing / Language / Visual special needs 0 []  - Assessment of Community assistance (transportation, D/C planning, etc.) 0 []  - Additional assistance / Altered mentation 0 []  - Support Surface(s) Assessment (bed, cushion, seat, etc.) 0 INTERVENTIONS - Wound Cleansing / Measurement X - Simple Wound  Cleansing - one wound 1 5 []  - Complex Wound Cleansing - multiple wounds 0 X - Wound Imaging (photographs - any number of wounds) 1 5 []  - Wound  Tracing (instead of photographs) 0 X - Simple Wound Measurement - one wound 1 5 []  - Complex Wound Measurement - multiple wounds 0 INTERVENTIONS - Wound Dressings X - Small Wound Dressing one or multiple wounds 1 10 []  - Medium Wound Dressing one or multiple wounds 0 []  - Large Wound Dressing one or multiple wounds 0 []  - Application of Medications - topical 0 []  - Application of Medications - injection 0 INTERVENTIONS - Miscellaneous []  - External ear exam 0 Madry, Beda S. ([id number]) []  - Specimen Collection (cultures, biopsies, blood, body fluids, etc.) 0 []  - Specimen(s) / Culture(s) sent or taken to Lab for analysis 0 []  - Patient Transfer (multiple staff / Harrel Lemon Lift / Similar devices) 0 []  - Simple Staple / Suture removal (25 or less) 0 []  - Complex Staple / Suture removal (26 or more) 0 []  - Hypo / Hyperglycemic Management (close monitor of Blood Glucose) 0 []  - Ankle / Brachial Index (ABI) - do not check if billed separately 0 X - Vital Signs 1 5 Has the patient been seen at the hospital within the last three years: Yes Total Score: 75 Level Of Care: New/Established - Level 2 Electronic Signature(s) Signed: 03/25/2016 4:21:46 PM By: Montey Hora Entered By: Montey Hora on 03/25/2016 11:56:49 Friede, [name] ([id number]) -------------------------------------------------------------------------------- Encounter Discharge Information Details Patient Name: [name], [name] Date of Service: 03/25/2016 10:00 AM Medical Record Number: [id number] Patient Account Number: [id number] Date of Birth/Sex: May 24, 1956 (60 y.o. Female) Treating RN: Montey Hora Primary Care Physician: [name] Other Clinician: Referring Physician: [name] Treating Physician/Extender: Frann Rider in Treatment: 73 Encounter Discharge  Information Items Discharge Pain Level: 0 Discharge Condition: Stable Ambulatory Status: [place] Discharge Destination: Home Transportation: Private Auto Accompanied By: self Schedule Follow-up Appointment: Yes Medication Reconciliation completed No and provided to Patient/Care Zaylen Susman: Provided on Clinical Summary of Care: 03/25/2016 Form Type Recipient Paper Patient LD Electronic Signature(s) Signed: 03/25/2016 4:21:46 PM By: Montey Hora Previous Signature: 03/25/2016 10:17:36 AM Version By: Ruthine Dose Entered By: Montey Hora on 03/25/2016 10:20:20 Siler, [name] ([id number]) -------------------------------------------------------------------------------- Lower Extremity Assessment Details Patient Name: Rolison, Darra S. Date of Service: 03/25/2016 10:00 AM Medical Record Number: [id number] Patient Account Number: [id number] Date of Birth/Sex: [birth year] (60 y.o. Female) Treating RN: Montey Hora Primary Care Physician: [name] Other Clinician: Referring Physician: [name] Treating Physician/Extender: Frann Rider in Treatment: 24 Electronic Signature(s) Signed: 03/25/2016 4:21:46 PM By: Montey Hora Entered By: Montey Hora on 03/25/2016 10:18:41 Billick, [name] ([id number]) -------------------------------------------------------------------------------- Multi Wound Chart Details Patient Name: Frye, Joelys S. Date of Service: 03/25/2016 10:00 AM Medical Record Number: [id number] Patient Account Number: [id number] Date of Birth/Sex: [birth year] (60 y.o. Female) Treating RN: Montey Hora Primary Care Physician: [name] Other Clinician: Referring Physician: [name] Treating Physician/Extender: Frann Rider in Treatment: 65 Vital Signs Height(in): 65 Pulse(bpm): 81 Weight(lbs): 156 Blood Pressure 156/55 (mmHg): Body Mass Index(BMI): 26 Temperature(F): 98.4 Respiratory Rate 18 (breaths/min): Photos:  [N/A:N/A] Wound Location: Right Back N/A N/A Wounding Event: Blister N/A N/A Primary Etiology: Pressure Ulcer N/A N/A Comorbid History: Glaucoma, Anemia, N/A N/A Coronary Artery Disease, Hypertension, Type II Diabetes, End Stage Renal Disease, Osteoarthritis, Neuropathy Date Acquired: 10/19/2013 N/A N/A Weeks of Treatment: 65 N/A N/A Wound Status: Open N/A N/A Measurements L x W x D 1.6x1.5x0.1 N/A N/A (cm) Area (cm) : 1.885 N/A N/A Volume (cm) : 0.188 N/A N/A % Reduction in Area: 95.10% N/A N/A % Reduction in Volume: 95.10% N/A N/A Classification:  Category/Stage II N/A N/A Exudate Amount: Medium N/A N/A Exudate Type: Sanguinous N/A N/A Exudate Color: red N/A N/A Wound Margin: Flat and Intact N/A N/A Greggs, Alichia S. ([id number]) Granulation Amount: Large (67-100%) N/A N/A Granulation Quality: Red, Friable N/A N/A Necrotic Amount: None Present (0%) N/A N/A Exposed Structures: Fascia: No N/A N/A Fat: No Tendon: No Muscle: No Joint: No Bone: No Limited to Skin Breakdown Epithelialization: Small (1-33%) N/A N/A Periwound Skin Texture: Scarring: Yes N/A N/A Edema: No Excoriation: No Induration: No Callus: No Crepitus: No Fluctuance: No Friable: No Rash: No Periwound Skin Moist: Yes N/A N/A Moisture: Maceration: No Dry/Scaly: No Periwound Skin Color: Ecchymosis: Yes N/A N/A Atrophie Blanche: No Cyanosis: No Erythema: No Hemosiderin Staining: No Mottled: No Pallor: No Rubor: No Temperature: No Abnormality N/A N/A Tenderness on No N/A N/A Palpation: Wound Preparation: Ulcer Cleansing: N/A N/A Rinsed/Irrigated with Saline Topical Anesthetic Applied: None Treatment Notes Electronic Signature(s) Signed: 03/25/2016 4:21:46 PM By: Montey Hora Entered By: Montey Hora on 03/25/2016 10:18:56 Koch, [name] ([id number]) -------------------------------------------------------------------------------- [place] Details Patient Name:  [name]. Date of Service: 03/25/2016 10:00 AM Medical Record Number: [id number] Patient Account Number: [id number] Date of Birth/Sex: 1956-05-07 (60 y.o. Female) Treating RN: Montey Hora Primary Care Physician: [name] Other Clinician: Referring Physician: [name] Treating Physician/Extender: Frann Rider in Treatment: 46 Active Inactive Abuse / Safety / Falls / Self Care Management Nursing Diagnoses: Potential for falls Goals: Patient will remain injury free Date Initiated: 12/23/2014 Goal Status: Active Patient/caregiver will verbalize understanding of skin care regimen Date Initiated: 12/23/2014 Goal Status: Active Patient/caregiver will verbalize/demonstrate measures taken to prevent injury and/or falls Date Initiated: 12/23/2014 Goal Status: Active Patient/caregiver will verbalize/demonstrate understanding of what to do in case of emergency Date Initiated: 12/23/2014 Goal Status: Active Interventions: Assess fall risk on admission and as needed Assess: immobility, friction, shearing, incontinence upon admission and as needed Assess impairment of mobility on admission and as needed per policy Assess self care needs on admission and as needed Provide education on fall prevention Notes: Wound/Skin Impairment Nursing Diagnoses: Impaired tissue integrity Knowledge deficit related to ulceration/compromised skin integrity Goals: Patient/caregiver will verbalize understanding of skin care regimen CHARMIKA, MERVINE ([id number]) Date Initiated: 12/23/2014 Goal Status: Active Ulcer/skin breakdown will heal within 14 weeks Date Initiated: 12/23/2014 Goal Status: Active Interventions: Assess patient/caregiver ability to obtain necessary supplies Assess patient/caregiver ability to perform ulcer/skin care regimen upon admission and as needed Assess ulceration(s) every visit Provide education on ulcer and skin care Treatment Activities: Skin care regimen initiated  : 11/05/2015 Topical wound management initiated : 11/05/2015 Notes: Electronic Signature(s) Signed: 03/25/2016 4:21:46 PM By: Montey Hora Entered By: Montey Hora on 03/25/2016 10:18:49 Lubbers, Tacy S. ([id number]) -------------------------------------------------------------------------------- Pain Assessment Details [name]. Date of Service: 03/25/2016 10:00 AM Medical Record Number: [id number] Patient Account Number: [id number] Date of Birth/Sex: December 31, 1955 (60 y.o. Female) Treating RN: Montey Hora Primary Care Physician: [name] Other Clinician: Referring Physician: [name] Treating Physician/Extender: Frann Rider in Treatment: 38 Active Problems Location of Pain Severity and Description of Pain Patient Has Paino No Site Locations Pain Management and Medication Current Pain Management: Notes Topical or injectable lidocaine is offered to patient for acute pain when surgical debridement is performed. If needed, Patient is instructed to use over the counter pain medication for the following 24-48 hours after debridement. Wound care MDs do not prescribed pain medications. Patient has chronic pain or uncontrolled pain. Patient has been instructed to make  an appointment with their Primary Care Physician for pain management. Electronic Signature(s) Signed: 03/25/2016 4:21:46 PM By: Montey Hora Entered By: Montey Hora on 03/25/2016 10:18:33 Hettinger, [name] ([id number]) -------------------------------------------------------------------------------- Patient/Caregiver Education Details [name]. Date of Service: 03/25/2016 10:00 AM Medical Record Number: [id number] Patient Account Number: [id number] Date of Birth/Gender: 02-16-1956 (60 y.o. Female) Treating RN: Montey Hora Primary Care Physician: [name] Other Clinician: Referring Physician: [name] Treating Physician/Extender: Frann Rider in  Treatment: 5 Education Assessment Education Provided To: Patient Education Topics Provided Wound/Skin Impairment: Handouts: Other: wound care as ordered Methods: Demonstration, Explain/Verbal Responses: State content correctly Electronic Signature(s) Signed: 03/25/2016 4:21:46 PM By: Montey Hora Entered By: Montey Hora on 03/25/2016 10:20:36 Pardy, [name] ([id number]) -------------------------------------------------------------------------------- Wound Assessment Details Patient Name: Schwenn, Labrittany S. Date of Service: 03/25/2016 10:00 AM Medical Record Number: [id number] Patient Account Number: [id number] Date of Birth/Sex: 1955-07-27 (60 y.o. Female) Treating RN: Montey Hora Primary Care Physician: [name] Other Clinician: Referring Physician: [name] Treating Physician/Extender: Frann Rider in Treatment: 33 Wound Status Wound Number: 1 Primary Pressure Ulcer Etiology: Wound Location: Right Back Wound Open Wounding Event: Blister Status: Date Acquired: 10/19/2013 Comorbid Glaucoma, Anemia, Coronary Artery Weeks Of Treatment: 104 History: Disease, Hypertension, Type II Clustered Wound: No Diabetes, End Stage Renal Disease, Osteoarthritis, Neuropathy Photos Wound Measurements Length: (cm) 1.6 Width: (cm) 1.5 Depth: (cm) 0.1 Area: (cm) 1.885 Volume: (cm) 0.188 % Reduction in Area: 95.1% % Reduction in Volume: 95.1% Epithelialization: Small (1-33%) Tunneling: No Undermining: No Wound Description Classification: Category/Stage II Wound Margin: Flat and Intact Exudate Amount: Medium Exudate Type: Sanguinous Exudate Color: red Foul Odor After Cleansing: No Wound Bed Granulation Amount: Large (67-100%) Exposed Structure Granulation Quality: Red, Friable Fascia Exposed: No Necrotic Amount: None Present (0%) Fat Layer Exposed: No Tendon Exposed: No Abrell, Uzma S. ([id number]) Muscle Exposed: No Joint Exposed: No Bone Exposed:  No Limited to Skin Breakdown Periwound Skin Texture Texture Color No Abnormalities Noted: No No Abnormalities Noted: No Callus: No Atrophie Blanche: No Crepitus: No Cyanosis: No Excoriation: No Ecchymosis: Yes Fluctuance: No Erythema: No Friable: No Hemosiderin Staining: No Induration: No Mottled: No Localized Edema: No Pallor: No Rash: No Rubor: No Scarring: Yes Temperature / Pain Moisture Temperature: No Abnormality No Abnormalities Noted: No Dry / Scaly: No Maceration: No Moist: Yes Wound Preparation Ulcer Cleansing: Rinsed/Irrigated with Saline Topical Anesthetic Applied: None Treatment Notes Wound #1 (Right Back) 1. Cleansed with: Clean wound with Normal Saline 3. Peri-wound Care: Skin Prep 4. Dressing Applied: Other dressing (specify in notes) 5. Secondary Dressing Applied Non-Adherent pad [place] Notes tritec silver foam Electronic Signature(s) Signed: 03/25/2016 2:52:21 PM By: Regan Lemming [name], RN Signed: 03/25/2016 4:21:46 PM By: Montey Hora Entered By: Regan Lemming on 03/25/2016 10:17:00 Cassara, [name] ([id number]) Staff, [name] ([id number]) -------------------------------------------------------------------------------- Vitals Details Patient Name: AMALFITANO, 21. Date of Service: 03/25/2016 10:00 AM Medical Record Number: [id number] Patient Account Number: [id number] Date of Birth/Sex: 12-Nov-1955 (60 y.o. Female) Treating RN: Montey Hora Primary Care Physician: [name] Other Clinician: Referring Physician: [name] Treating Physician/Extender: Frann Rider in Treatment: 60 Vital Signs Time Taken: 10:01 Temperature (F): 98.4 Height (in): 65 Pulse (bpm): 81 Weight (lbs): 156 Respiratory Rate (breaths/min): 18 Body Mass Index (BMI): 26 Blood Pressure (mmHg): 156/55 Reference Range: 80 - 120 mg / dl Electronic Signature(s) Signed: 03/25/2016 4:21:46 PM By: Montey Hora Entered By: Montey Hora on 03/25/2016  10:18:37

## 2016-04-01 ENCOUNTER — Encounter: Payer: Medicare Other | Admitting: Surgery

## 2016-04-01 DIAGNOSIS — E11622 Type 2 diabetes mellitus with other skin ulcer: Secondary | ICD-10-CM | POA: Diagnosis not present

## 2016-04-04 NOTE — Progress Notes (Signed)
SIDNY, RAMADAN ([id number]) Visit Report for 04/01/2016 Arrival Information Details [name], [name]. Date of Service: 04/01/2016 1:30 PM Medical Record Number: [id number] Patient Account Number: [id number] Date of Birth/Sex: [birth year] (60 y.o. Female) Treating RN: Cornell Barman Primary Care Physician: [name] Other Clinician: Referring Physician: [name] Treating Physician/Extender: Frann Rider in Treatment: 66 Visit Information History Since Last Visit Added or deleted any medications: No Patient Arrived: Walker Any new allergies or adverse reactions: No Arrival Time: 13:30 Had a fall or experienced change in No Accompanied By: self activities of daily living that may affect Transfer Assistance: None risk of falls: Patient Identification Verified: Yes Signs or symptoms of abuse/neglect since last No Patient Requires Transmission- No visito Based Precautions: Hospitalized since last visit: No Patient Has Alerts: Yes Has Dressing in Place as Prescribed: Yes Patient Alerts: Patient on Blood Pain Present Now: No Thinner Electronic Signature(s) Signed: 04/04/2016 10:02:59 AM By: Gretta Cool, RN, [name], Kim RN, [name] Entered By: Gretta Cool, RN, [name], Kim on 04/01/2016 13:31:31 Decoste, [name] ([id number]) -------------------------------------------------------------------------------- Clinic Level of Care Assessment Details [name]. Date of Service: 04/01/2016 1:30 PM Medical Record Number: [id number] Patient Account Number: [id number] Date of Birth/Sex: May 23, 1956 (60 y.o. Female) Treating RN: Cornell Barman Primary Care Physician: [name] Other Clinician: Referring Physician: [name] Treating Physician/Extender: Frann Rider in Treatment: 70 Clinic Level of Care Assessment Items TOOL 4 Quantity Score []  - Use when only an EandM is performed on FOLLOW-UP visit 0 ASSESSMENTS - Nursing Assessment / Reassessment []  -  Reassessment of Co-morbidities (includes updates in patient status) 0 X - Reassessment of Adherence to Treatment Plan 1 5 ASSESSMENTS - Wound and Skin Assessment / Reassessment X - Simple Wound Assessment / Reassessment - one wound 1 5 []  - Complex Wound Assessment / Reassessment - multiple wounds 0 []  - Dermatologic / Skin Assessment (not related to wound area) 0 ASSESSMENTS - Focused Assessment []  - Circumferential Edema Measurements - multi extremities 0 []  - Nutritional Assessment / Counseling / Intervention 0 []  - Lower Extremity Assessment (monofilament, tuning fork, pulses) 0 []  - Peripheral Arterial Disease Assessment (using hand held doppler) 0 ASSESSMENTS - Ostomy and/or Continence Assessment and Care []  - Incontinence Assessment and Management 0 []  - Ostomy Care Assessment and Management (repouching, etc.) 0 PROCESS - Coordination of Care X - Simple Patient / Family Education for ongoing care 1 15 []  - Complex (extensive) Patient / Family Education for ongoing care 0 []  - Staff obtains Programmer, systems, Records, Test Results / Process Orders 0 []  - Staff telephones HHA, Nursing Homes / Clarify orders / etc 0 []  - Routine Transfer to another Facility (non-emergent condition) 0 Markovic, Leler S. ([id number]) []  - Routine Hospital Admission (non-emergent condition) 0 []  - New Admissions / Biomedical engineer / Ordering NPWT, Apligraf, etc. 0 []  - Emergency Hospital Admission (emergent condition) 0 X - Simple Discharge Coordination 1 10 []  - Complex (extensive) Discharge Coordination 0 PROCESS - Special Needs []  - Pediatric / Minor Patient Management 0 []  - Isolation Patient Management 0 []  - Hearing / Language / Visual special needs 0 []  - Assessment of Community assistance (transportation, D/C planning, etc.) 0 []  - Additional assistance / Altered mentation 0 []  - Support Surface(s) Assessment (bed, cushion, seat, etc.) 0 INTERVENTIONS - Wound Cleansing / Measurement X - Simple  Wound Cleansing - one wound 1 5 []  - Complex Wound Cleansing - multiple wounds 0 X - Wound Imaging (photographs - any number  of wounds) 1 5 []  - Wound Tracing (instead of photographs) 0 X - Simple Wound Measurement - one wound 1 5 []  - Complex Wound Measurement - multiple wounds 0 INTERVENTIONS - Wound Dressings X - Small Wound Dressing one or multiple wounds 1 10 []  - Medium Wound Dressing one or multiple wounds 0 []  - Large Wound Dressing one or multiple wounds 0 []  - Application of Medications - topical 0 []  - Application of Medications - injection 0 INTERVENTIONS - Miscellaneous []  - External ear exam 0 Howells, Anastacia S. ([id number]) []  - Specimen Collection (cultures, biopsies, blood, body fluids, etc.) 0 []  - Specimen(s) / Culture(s) sent or taken to Lab for analysis 0 []  - Patient Transfer (multiple staff / Harrel Lemon Lift / Similar devices) 0 []  - Simple Staple / Suture removal (25 or less) 0 []  - Complex Staple / Suture removal (26 or more) 0 []  - Hypo / Hyperglycemic Management (close monitor of Blood Glucose) 0 []  - Ankle / Brachial Index (ABI) - do not check if billed separately 0 X - Vital Signs 1 5 Has the patient been seen at the hospital within the last three years: Yes Total Score: 65 Level Of Care: New/Established - Level 2 Electronic Signature(s) Signed: 04/04/2016 10:02:59 AM By: Gretta Cool, RN, [name], Kim RN, [name] Entered By: Gretta Cool, RN, [name], Kim on 04/01/2016 13:41:05 Pinney, [name] ([id number]) -------------------------------------------------------------------------------- Encounter Discharge Information Details [name]. Date of Service: 04/01/2016 1:30 PM Medical Record Number: [id number] Patient Account Number: [id number] Date of Birth/Sex: 26-Jan-1956 (60 y.o. Female) Treating RN: Cornell Barman Primary Care Physician: [name] Other Clinician: Referring Physician: [name] Treating Physician/Extender: Frann Rider in Treatment:  12 Encounter Discharge Information Items Discharge Pain Level: 0 Discharge Condition: Stable Ambulatory Status: Walker Discharge Destination: Nursing Home Transportation: Private Auto Accompanied By: self Schedule Follow-up Appointment: Yes Medication Reconciliation completed Yes and provided to Patient/Care Tyshika Baldridge: Provided on Clinical Summary of Care: 04/01/2016 Form Type Recipient Paper Patient LD Electronic Signature(s) Signed: 04/01/2016 1:45:57 PM By: Ruthine Dose Entered By: Ruthine Dose on 04/01/2016 13:45:57 [place], [name] ([id number]) -------------------------------------------------------------------------------- Multi Wound Chart Details [name]. Date of Service: 04/01/2016 1:30 PM Medical Record Number: [id number] Patient Account Number: [id number] Date of Birth/Sex: December 31, 1955 (60 y.o. Female) Treating RN: Cornell Barman Primary Care Physician: [name] Other Clinician: Referring Physician: [name] Treating Physician/Extender: Frann Rider in Treatment: 46 Vital Signs Height(in): 65 Pulse(bpm): 82 Weight(lbs): 156 Blood Pressure 158/55 (mmHg): Body Mass Index(BMI): 26 Temperature(F): 98.4 Respiratory Rate 16 (breaths/min): Photos: [N/A:N/A] Wound Location: Right Back N/A N/A Wounding Event: Blister N/A N/A Primary Etiology: Pressure Ulcer N/A N/A Comorbid History: Glaucoma, Anemia, N/A N/A Coronary Artery Disease, Hypertension, Type II Diabetes, End Stage Renal Disease, Osteoarthritis, Neuropathy Date Acquired: 10/19/2013 N/A N/A Weeks of Treatment: 66 N/A N/A Wound Status: Open N/A N/A Measurements L x W x D 1.5x1.2x0.1 N/A N/A (cm) Area (cm) : 1.414 N/A N/A Volume (cm) : 0.141 N/A N/A % Reduction in Area: 96.30% N/A N/A % Reduction in Volume: 96.30% N/A N/A Classification: Category/Stage II N/A N/A Exudate Amount: Medium N/A N/A Exudate Type: Sanguinous N/A N/A Exudate Color: red N/A N/A Wound  Margin: Flat and Intact N/A N/A Granulation Amount: Large (67-100%) N/A N/A Watrous, Gaile S. ([id number]) Granulation Quality: Red, Friable N/A N/A Necrotic Amount: None Present (0%) N/A N/A Exposed Structures: Fascia: No N/A N/A Fat: No Tendon: No Muscle: No Joint: No Bone: No Limited to Skin Breakdown Epithelialization:  Small (1-33%) N/A N/A Periwound Skin Texture: Scarring: Yes N/A N/A Edema: No Excoriation: No Induration: No Callus: No Crepitus: No Fluctuance: No Friable: No Rash: No Periwound Skin Moist: Yes N/A N/A Moisture: Maceration: No Dry/Scaly: No Periwound Skin Color: Ecchymosis: Yes N/A N/A Atrophie Blanche: No Cyanosis: No Erythema: No Hemosiderin Staining: No Mottled: No Pallor: No Rubor: No Temperature: No Abnormality N/A N/A Tenderness on No N/A N/A Palpation: Wound Preparation: Ulcer Cleansing: N/A N/A Rinsed/Irrigated with Saline Topical Anesthetic Applied: None Treatment Notes Electronic Signature(s) Signed: 04/04/2016 10:02:59 AM By: Gretta Cool, RN, [name], Kim RN, [name] Entered By: Gretta Cool, RN, [name], Kim on 04/01/2016 13:40:10 Samford, [name] ([id number]) -------------------------------------------------------------------------------- [place] Details Patient Name: GRACLYNN, QUINNELL. Date of Service: 04/01/2016 1:30 PM Medical Record Number: [id number] Patient Account Number: [id number] Date of Birth/Sex: February 20, 1956 (60 y.o. Female) Treating RN: Cornell Barman Primary Care Physician: [name] Other Clinician: Referring Physician: [name] Treating Physician/Extender: Frann Rider in Treatment: 19 Active Inactive Abuse / Safety / Falls / Self Care Management Nursing Diagnoses: Potential for falls Goals: Patient will remain injury free Date Initiated: 12/23/2014 Goal Status: Active Patient/caregiver will verbalize understanding of skin care regimen Date Initiated: 12/23/2014 Goal Status: Active Patient/caregiver will  verbalize/demonstrate measures taken to prevent injury and/or falls Date Initiated: 12/23/2014 Goal Status: Active Patient/caregiver will verbalize/demonstrate understanding of what to do in case of emergency Date Initiated: 12/23/2014 Goal Status: Active Interventions: Assess fall risk on admission and as needed Assess: immobility, friction, shearing, incontinence upon admission and as needed Assess impairment of mobility on admission and as needed per policy Assess self care needs on admission and as needed Provide education on fall prevention Notes: Wound/Skin Impairment Nursing Diagnoses: Impaired tissue integrity Knowledge deficit related to ulceration/compromised skin integrity Goals: Patient/caregiver will verbalize understanding of skin care regimen TANEASHA, MOSAKOWSKI ([id number]) Date Initiated: 12/23/2014 Goal Status: Active Ulcer/skin breakdown will heal within 14 weeks Date Initiated: 12/23/2014 Goal Status: Active Interventions: Assess patient/caregiver ability to obtain necessary supplies Assess patient/caregiver ability to perform ulcer/skin care regimen upon admission and as needed Assess ulceration(s) every visit Provide education on ulcer and skin care Treatment Activities: Skin care regimen initiated : 11/05/2015 Topical wound management initiated : 11/05/2015 Notes: Electronic Signature(s) Signed: 04/04/2016 10:02:59 AM By: Gretta Cool, RN, [name], Kim RN, [name] Entered By: Gretta Cool, RN, [name], Kim on 04/01/2016 13:40:01 Tsuchiya, [name] ([id number]) -------------------------------------------------------------------------------- Pain Assessment Details [name]. Date of Service: 04/01/2016 1:30 PM Medical Record Number: [id number] Patient Account Number: [id number] Date of Birth/Sex: 01/16/1956 (60 y.o. Female) Treating RN: Cornell Barman Primary Care Physician: [name] Other Clinician: Referring Physician: [name] Treating Physician/Extender: Frann Rider in Treatment: 66 Active Problems Location of Pain Severity and Description of Pain Patient Has Paino Yes Site Locations Pain Location: Generalized Pain With Dressing Change: No Pain Management and Medication Current Pain Management: Goals for Pain Management Topical or injectable lidocaine is offered to patient for acute pain when surgical debridement is performed. If needed, Patient is instructed to use over the counter pain medication for the following 24-48 hours after debridement. Wound care MDs do not prescribed pain medications. Patient has chronic pain or uncontrolled pain. Patient has been instructed to make an appointment with their Primary Care Physician for pain management. Notes Patient states she has a pain level of 10 due to a break in her right foot. Electronic Signature(s) Signed: 04/04/2016 10:02:59 AM By: Gretta Cool, RN, [name], Kim RN, [name] Entered By: Gretta Cool, RN, [name], Kim  on 04/01/2016 13:32:57 Kotz, [name] ([id number]) -------------------------------------------------------------------------------- Patient/Caregiver Education Details [name]. Date of Service: 04/01/2016 1:30 PM Medical Record Number: [id number] Patient Account Number: [id number] Date of Birth/Gender: 07/09/1956 (60 y.o. Female) Treating RN: Cornell Barman Primary Care Physician: [name] Other Clinician: Referring Physician: [name] Treating Physician/Extender: Frann Rider in Treatment: 39 Education Assessment Education Provided To: Patient Education Topics Provided Wound/Skin Impairment: Handouts: Caring for Your Ulcer, Other: wpound care as prescribed Methods: Demonstration Responses: State content correctly Electronic Signature(s) Signed: 04/04/2016 10:02:59 AM By: Gretta Cool, RN, [name], Kim RN, [name] Entered By: Gretta Cool, RN, [name], Kim on 04/01/2016 13:42:06 Bogard, [name]  ([id number]) -------------------------------------------------------------------------------- Wound Assessment Details Patient Name: ARCELY, NILE. Date of Service: 04/01/2016 1:30 PM Medical Record Number: [id number] Patient Account Number: [id number] Date of Birth/Sex: April 06, 1956 (60 y.o. Female) Treating RN: Cornell Barman Primary Care Physician: [name] Other Clinician: Referring Physician: [name] Treating Physician/Extender: Frann Rider in Treatment: 66 Wound Status Wound Number: 1 Primary Pressure Ulcer Etiology: Wound Location: Right Back Wound Open Wounding Event: Blister Status: Date Acquired: 10/19/2013 Comorbid Glaucoma, Anemia, Coronary Artery Weeks Of Treatment: 66 History: Disease, Hypertension, Type II Clustered Wound: No Diabetes, End Stage Renal Disease, Osteoarthritis, Neuropathy Photos Wound Measurements Length: (cm) 1.5 Width: (cm) 1.2 Depth: (cm) 0.1 Area: (cm) 1.414 Volume: (cm) 0.141 % Reduction in Area: 96.3% % Reduction in Volume: 96.3% Epithelialization: Small (1-33%) Tunneling: No Undermining: No Wound Description Classification: Category/Stage II Wound Margin: Flat and Intact Exudate Amount: Medium Exudate Type: Sanguinous Exudate Color: red Foul Odor After Cleansing: No Wound Bed Granulation Amount: Large (67-100%) Exposed Structure Granulation Quality: Red, Friable Fascia Exposed: No Necrotic Amount: None Present (0%) Fat Layer Exposed: No Tendon Exposed: No Muscle Exposed: No Joint Exposed: No Finks, Shandell S. ([id number]) Bone Exposed: No Limited to Skin Breakdown Periwound Skin Texture Texture Color No Abnormalities Noted: No No Abnormalities Noted: No Callus: No Atrophie Blanche: No Crepitus: No Cyanosis: No Excoriation: No Ecchymosis: Yes Fluctuance: No Erythema: No Friable: No Hemosiderin Staining: No Induration: No Mottled: No Localized Edema: No Pallor: No Rash: No Rubor: No Scarring: Yes  Temperature / Pain Moisture Temperature: No Abnormality No Abnormalities Noted: No Dry / Scaly: No Maceration: No Moist: Yes Wound Preparation Ulcer Cleansing: Rinsed/Irrigated with Saline Topical Anesthetic Applied: None Treatment Notes Wound #1 (Right Back) 1. Cleansed with: Clean wound with Normal Saline 4. Dressing Applied: Other dressing (specify in notes) 5. Secondary [place] Notes tritec silver foam Electronic Signature(s) Signed: 04/04/2016 10:02:59 AM By: Gretta Cool, RN, [name], Kim RN, [name] Entered By: Gretta Cool, RN, [name], Kim on 04/01/2016 13:36:48 Rexrode, [name] ([id number]) -------------------------------------------------------------------------------- Vitals Details [name]. Date of Service: 04/01/2016 1:30 PM Medical Record Number: [id number] Patient Account Number: [id number] Date of Birth/Sex: [birth year] (60 y.o. Female) Treating RN: Cornell Barman Primary Care Physician: [name] Other Clinician: Referring Physician: [name] Treating Physician/Extender: Frann Rider in Treatment: 26 Vital Signs Time Taken: 13:31 Temperature (F): 98.4 Height (in): 65 Pulse (bpm): 82 Weight (lbs): 156 Respiratory Rate (breaths/min): 16 Body Mass Index (BMI): 26 Blood Pressure (mmHg): 158/55 Reference Range: 80 - 120 mg / dl Electronic Signature(s) Signed: 04/04/2016 10:02:59 AM By: Gretta Cool, RN, [name], Kim RN, [name] Entered By: Gretta Cool, RN, [name], Kim on 04/01/2016 13:31:58

## 2016-04-04 NOTE — Progress Notes (Signed)
KEYLYN, COLESTOCK ([id number]) Visit Report for 04/01/2016 Chief Complaint Document Details [name], [name]. Date of Service: 04/01/2016 1:30 PM Medical Record Number: [id number] Patient Account Number: [id number] Date of Birth/Sex: 09-05-1955 (60 y.o. Female) Treating RN: Cornell Barman Primary Care Physician: [name] Other Clinician: Referring Physician: [name] Treating Physician/Extender: Frann Rider in Treatment: 66 Information Obtained from: Patient Chief Complaint Patient presents to the wound care center for a consult due non healing wound. She has an open wound on her right upper back which she's had for about a year and she recently noticed a blister on her right lower extremity about 2 weeks ago. Electronic Signature(s) Signed: 04/01/2016 1:45:10 PM By: [name], [name] Entered By: [name] on 04/01/2016 13:45:09 Thew, [name] ([id number]) -------------------------------------------------------------------------------- HPI Details [name], [name]. Date of Service: 04/01/2016 1:30 PM Medical Record Number: [id number] Patient Account Number: [id number] Date of Birth/Sex: 04/05/1956 (60 y.o. Female) Treating RN: Cornell Barman Primary Care Physician: [name] Other Clinician: Referring Physician: [name] Treating Physician/Extender: Frann Rider in Treatment: 74 History of Present Illness Location: right upper back and right lower extremity wounds Quality: Patient reports No Pain. Severity: Patient states wound (s) are getting better. Duration: Patient has had the wound for > 3 months prior to seeking treatment at the wound center Timing: she thought it first occurred when she was using a heating pad about a year ago after back surgery. Context: The wound appeared gradually over time Modifying Factors: Patient is currently on renal dialysis and receives treatments 3 times weekly Associated Signs and Symptoms:  Patient reports having: surgery scheduled for this week for a AV fistula left arm. HPI Description: 60 year old patient who is known to be a diabetic and has end-stage renal disease has had several comorbidities including coronary artery disease, hypertension, hyperlipidemia, pancreatitis, anemia, previous history of hysterectomy, cholecystectomy, left-sided salivary gland excision, bilateral cataract surgery,Peritoneal dialysis catheter, hemodialysis catheter. the area on the back has also been caused by instant pressure she used to sleep on a recliner all day and has significant kyphoscoliosis. As far as the wound on her right lower extremity she's not sure how this blister occurred but she thought it has been there for about 2 weeks. No recent blood investigations available and no recent hemoglobin A1c. 12/30/2014 -- she is an assisted living facility but I believe the nurses that have not followed instructions as she had some cream applied on her back and there was a different dressing. Last week she's had a AV fistula placed on her left forearm. 01/13/2015 -- she has had some localized infection at the port site and she's been on doxycycline for this. 02/05/2015 - he has developed a small blister on her right lower extremity. 02/10/2015 -- she has developed another small blister on her right anterior chest wall in the area where she's had tape for her dialysis access. this may just be injury caused by a tape burn. 02/18/2015 -- no new blisters and she had a dermatology opinion and they have taken a biopsy of her skin. She also had a left brachial AV fistula placed this week. 03/03/2015 -- though we do not have the pathology report yet the patient says she has been put on prednisone because this skin disease is possibly to do with her immune system and her dermatologist is recommended this. 03/10/2015 -- she has developed a new blister which is quite large on her right lower extremity on  the shin.  05/22/2015 -- she did very well with her dressing but on trying to remove the [place] it peels away the newly healed skin. [name], [name] ([id number]) 06/05/2015 -- the patient was in the ER this past week for severe bleeding from the right nostril and had to have ENT see her and do a packing. They've also been holding her heparin during her dialysis. The patient is going back to ENT today. Other than that she has had no other significant issues. 06/19/2015 -- she is back on her blood thinners and continues to have her hemodialysis as before. 07/31/2015 -- a large blister has popped up again on her right lower extremity in the same place in the mid shin area in the anterior lateral compartment. 10/05/2015 -- last week she was admitted to the hospital for 4 days as she had a pneumonia and was treated with IV and oral antibiotics. Electronic Signature(s) Signed: 04/01/2016 1:45:15 PM By: [name], [name] Entered By: [name] on 04/01/2016 13:45:15 [name], [name] ([id number]) -------------------------------------------------------------------------------- Physical Exam Details [name], [name] Date of Service: 04/01/2016 1:30 PM Medical Record Number: [id number] Patient Account Number: [id number] Date of Birth/Sex: 1955-10-03 (60 y.o. Female) Treating RN: Cornell Barman Primary Care Physician: [name] Other Clinician: Referring Physician: [name] Treating Physician/Extender: Frann Rider in Treatment: 31 Constitutional . Pulse regular. Respirations normal and unlabored. Afebrile. . Eyes Nonicteric. Reactive to light. Ears, Nose, Mouth, and Throat Lips, teeth, and gums WNL.Marland Kitchen Moist mucosa without lesions. Neck supple and nontender. No palpable supraclavicular or cervical adenopathy. Normal sized without goiter. Respiratory WNL. No retractions.. Cardiovascular Pedal Pulses WNL. No clubbing, cyanosis or edema. Chest Breasts  symmetical and no nipple discharge.. Breast tissue WNL, no masses, lumps, or tenderness.. Lymphatic No adneopathy. No adenopathy. No adenopathy. Musculoskeletal Adexa without tenderness or enlargement.. Digits and nails w/o clubbing, cyanosis, infection, petechiae, ischemia, or inflammatory conditions.. Integumentary (Hair, Skin) No suspicious lesions. No crepitus or fluctuance. No peri-wound warmth or erythema. No masses.Marland Kitchen Psychiatric Judgement and insight Intact.. No evidence of depression, anxiety, or agitation.. Notes overall looks much better and there is a small open area which is granulating well and there is no surrounding cellulitis. Electronic Signature(s) Signed: 04/01/2016 1:45:47 PM By: [name], [name] Entered By: [name] on 04/01/2016 13:45:47 [name], [name] ([id number]) -------------------------------------------------------------------------------- Physician Orders Details [name]. Date of Service: 04/01/2016 1:30 PM Medical Record Number: [id number] Patient Account Number: [id number] Date of Birth/Sex: 1955/10/06 (60 y.o. Female) Treating RN: Cornell Barman Primary Care Physician: [name] Other Clinician: Referring Physician: [name] Treating Physician/Extender: Frann Rider in Treatment: 50 Verbal / Phone Orders: Yes Clinician: Cornell Barman Read Back and Verified: Yes Diagnosis Coding Wound Cleansing Wound #1 Right Back o Clean wound with Normal Saline. o May Shower, gently pat wound dry prior to applying new dressing. - PATIENT MAY SHOWER AND [place] WOUND WITH SOAP AND WATER WITH CNA ASSISTANCE AND PAT WOUND DRY AND SNF NURSE TO APPLY NEW DRESSING RIGHT AFTERWARDS Skin Barriers/Peri-Wound Care Wound #1 Right Back o Skin Prep Primary Wound Dressing Wound #1 Right Back o Other: - Tritec Foam - Please wet prior to removing; Cover with [facility] Dressing Dressing Change Frequency Wound #1 Right Back o  Other: - Change when soiled Follow-up Appointments Wound #1 Right Back o Return Appointment in 1 week. Electronic Signature(s) Signed: 04/01/2016 4:41:20 PM By: [name], [name] Signed: 04/04/2016 10:02:59 AM By: Gretta Cool RN, [name], Kim RN, [name]  Entered By: Gretta Cool, RN, [name], Kim on 04/01/2016 13:40:39 Jhaveri, [name] ([id number]) -------------------------------------------------------------------------------- Problem List Details [name], [name]. Date of Service: 04/01/2016 1:30 PM Medical Record Number: [id number] Patient Account Number: [id number] Date of Birth/Sex: 09-23-1955 (60 y.o. Female) Treating RN: Cornell Barman Primary Care Physician: [name] Other Clinician: Referring Physician: [name] Treating Physician/Extender: Frann Rider in Treatment: 71 Active Problems ICD-10 Encounter Code Description Active Date Diagnosis E11.622 Type 2 diabetes mellitus with other skin ulcer 12/23/2014 Yes L89.112 Pressure ulcer of right upper back, stage 2 12/23/2014 Yes S80.821A Blister (nonthermal), right lower leg, initial encounter 12/23/2014 Yes N18.6 End stage renal disease 12/23/2014 Yes Inactive Problems Resolved Problems Electronic Signature(s) Signed: 04/01/2016 1:45:03 PM By: [name], [name] Entered By: [name] on 04/01/2016 13:45:03 [name], [name] ([id number]) -------------------------------------------------------------------------------- Progress Note Details [name], [name] Date of Service: 04/01/2016 1:30 PM Medical Record Number: [id number] Patient Account Number: [id number] Date of Birth/Sex: Apr 10, 1956 (60 y.o. Female) Treating RN: Cornell Barman Primary Care Physician: [name] Other Clinician: Referring Physician: [name] Treating Physician/Extender: Frann Rider in Treatment: 34 Subjective Chief Complaint Information obtained from Patient Patient presents to the wound care center for a consult due non  healing wound. She has an open wound on her right upper back which she's had for about a year and she recently noticed a blister on her right lower extremity about 2 weeks ago. History of Present Illness (HPI) The following HPI elements were documented for the patient's wound: Location: right upper back and right lower extremity wounds Quality: Patient reports No Pain. Severity: Patient states wound (s) are getting better. Duration: Patient has had the wound for > 3 months prior to seeking treatment at the wound center Timing: she thought it first occurred when she was using a heating pad about a year ago after back surgery. Context: The wound appeared gradually over time Modifying Factors: Patient is currently on renal dialysis and receives treatments 3 times weekly Associated Signs and Symptoms: Patient reports having: surgery scheduled for this week for a AV fistula left arm. 60 year old patient who is known to be a diabetic and has end-stage renal disease has had several comorbidities including coronary artery disease, hypertension, hyperlipidemia, pancreatitis, anemia, previous history of hysterectomy, cholecystectomy, left-sided salivary gland excision, bilateral cataract surgery,Peritoneal dialysis catheter, hemodialysis catheter. the area on the back has also been caused by instant pressure she used to sleep on a recliner all day and has significant kyphoscoliosis. As far as the wound on her right lower extremity she's not sure how this blister occurred but she thought it has been there for about 2 weeks. No recent blood investigations available and no recent hemoglobin A1c. 12/30/2014 -- she is an assisted living facility but I believe the nurses that have not followed instructions as she had some cream applied on her back and there was a different dressing. Last week she's had a AV fistula placed on her left forearm. 01/13/2015 -- she has had some localized infection at the port  site and she's been on doxycycline for this. 02/05/2015 - he has developed a small blister on her right lower extremity. 02/10/2015 -- she has developed another small blister on her right anterior chest wall in the area where she's had tape for her dialysis access. this may just be injury caused by a tape burn. 02/18/2015 -- no new blisters and she had a dermatology opinion and they have taken a biopsy of her skin. [name],  [name] ([id number]) She also had a left brachial AV fistula placed this week. 03/03/2015 -- though we do not have the pathology report yet the patient says she has been put on prednisone because this skin disease is possibly to do with her immune system and her dermatologist is recommended this. 03/10/2015 -- she has developed a new blister which is quite large on her right lower extremity on the shin. 05/22/2015 -- she did very well with her dressing but on trying to remove the [place] it peels away the newly healed skin. 06/05/2015 -- the patient was in the ER this past week for severe bleeding from the right nostril and had to have ENT see her and do a packing. They've also been holding her heparin during her dialysis. The patient is going back to ENT today. Other than that she has had no other significant issues. 06/19/2015 -- she is back on her blood thinners and continues to have her hemodialysis as before. 07/31/2015 -- a large blister has popped up again on her right lower extremity in the same place in the mid shin area in the anterior lateral compartment. 10/05/2015 -- last week she was admitted to the hospital for 4 days as she had a pneumonia and was treated with IV and oral antibiotics. Objective Constitutional Pulse regular. Respirations normal and unlabored. Afebrile. Vitals Time Taken: 1:31 PM, Height: 65 in, Weight: 156 lbs, BMI: 26, Temperature: 98.4 F, Pulse: 82 bpm, Respiratory Rate: 16 breaths/min, Blood Pressure: 158/55  mmHg. Eyes Nonicteric. Reactive to light. Ears, Nose, Mouth, and Throat Lips, teeth, and gums WNL.Marland Kitchen Moist mucosa without lesions. Neck supple and nontender. No palpable supraclavicular or cervical adenopathy. Normal sized without goiter. Respiratory WNL. No retractions.. Cardiovascular Pedal Pulses WNL. No clubbing, cyanosis or edema. Chest Breasts symmetical and no nipple discharge.. Breast tissue WNL, no masses, lumps, or tenderness.[name] ([id number]) Lymphatic No adneopathy. No adenopathy. No adenopathy. Musculoskeletal Adexa without tenderness or enlargement.. Digits and nails w/o clubbing, cyanosis, infection, petechiae, ischemia, or inflammatory conditions.Marland Kitchen Psychiatric Judgement and insight Intact.. No evidence of depression, anxiety, or agitation.. General Notes: overall looks much better and there is a small open area which is granulating well and there is no surrounding cellulitis. Integumentary (Hair, Skin) No suspicious lesions. No crepitus or fluctuance. No peri-wound warmth or erythema. No masses.. Wound #1 status is Open. Original cause of wound was Blister. The wound is located on the Right Back. The wound measures 1.5cm length x 1.2cm width x 0.1cm depth; 1.414cm^2 area and 0.141cm^3 volume. The wound is limited to skin breakdown. There is no tunneling or undermining noted. There is a medium amount of sanguinous drainage noted. The wound margin is flat and intact. There is large (67-100%) red, friable granulation within the wound bed. There is no necrotic tissue within the wound bed. The periwound skin appearance exhibited: Scarring, Moist, Ecchymosis. The periwound skin appearance did not exhibit: Callus, Crepitus, Excoriation, Fluctuance, Friable, Induration, Localized Edema, Rash, Dry/Scaly, Maceration, Atrophie Blanche, Cyanosis, Hemosiderin Staining, Mottled, Pallor, Rubor, Erythema. Periwound temperature was noted as No  Abnormality. Assessment Active Problems ICD-10 E11.622 - Type 2 diabetes mellitus with other skin ulcer L89.112 - Pressure ulcer of right upper back, stage 2 S80.821A - Blister (nonthermal), right lower leg, initial encounter N18.6 - End stage renal disease Plan Wound Cleansing: Wound #1 Right Back: [name], [name] ([id number]) Clean wound with Normal Saline. May Shower, gently pat wound dry prior to applying new dressing. - PATIENT MAY SHOWER  AND [name] WOUND WITH SOAP AND WATER WITH CNA ASSISTANCE AND PAT WOUND DRY AND SNF NURSE TO APPLY NEW DRESSING RIGHT AFTERWARDS Skin Barriers/Peri-Wound Care: Wound #1 Right Back: Skin Prep Primary Wound Dressing: Wound #1 Right Back: Other: - Tritec Foam - Please wet prior to removing; Cover with [facility] Dressing Dressing Change Frequency: Wound #1 Right Back: Other: - Change when soiled Follow-up Appointments: Wound #1 Right Back: Return Appointment in 1 week. In view of the fragile skin I am going to try Tritec Ultra silver foam, lasted to change it only twice a week if possible. last week she's had an excellent result with this. Electronic Signature(s) Signed: 04/01/2016 1:46:12 PM By: [name], [name] Entered By: [name] on 04/01/2016 13:46:11 Gervacio, [name] ([id number]) -------------------------------------------------------------------------------- SuperBill Details [name]. Date of Service: 04/01/2016 Medical Record Number: [id number] Patient Account Number: [id number] Date of Birth/Sex: [birth year] (60 y.o. Female) Treating RN: Cornell Barman Primary Care Physician: [name] Other Clinician: Referring Physician: [name] Treating Physician/Extender: Frann Rider in Treatment: 66 Diagnosis Coding ICD-10 Codes Code Description E11.622 Type 2 diabetes mellitus with other skin ulcer L89.112 Pressure ulcer of right upper back, stage 2 S80.821A Blister (nonthermal), right lower  leg, initial encounter N18.6 End stage renal disease Facility Procedures CPT4 Code: [id number] Description: [phone] - WOUND CARE VISIT-LEV 2 EST PT Modifier: Quantity: 1 Physician Procedures CPT4 Code: [id number] Description: [id number] - WC PHYS LEVEL 3 - EST PT ICD-10 Description Diagnosis E11.622 Type 2 diabetes mellitus with other skin ulcer L89.112 Pressure ulcer of right upper back, stage 2 S80.821A Blister (nonthermal), right lower leg, initial enc Modifier: ounter Quantity: 1 Electronic Signature(s) Signed: 04/01/2016 1:57:04 PM By: [name], [name] Entered By: [name] on 04/01/2016 13:57:04

## 2016-04-11 ENCOUNTER — Encounter: Payer: Medicare Other | Admitting: Surgery

## 2016-04-11 DIAGNOSIS — E11622 Type 2 diabetes mellitus with other skin ulcer: Secondary | ICD-10-CM | POA: Diagnosis not present

## 2016-04-11 NOTE — Progress Notes (Signed)
KIMYATTA, SHINABERRY ([id number]) Visit Report for 04/11/2016 Arrival Information Details [name], [name]. Date of Service: 04/11/2016 9:15 AM Medical Record Number: [id number] Patient Account Number: [id number] Date of Birth/Sex: June 13, 1956 (60 y.o. Female) Treating RN: Montey Hora Primary Care Physician: [name] Other Clinician: Referring Physician: [name] Treating Physician/Extender: Frann Rider in Treatment: 73 Visit Information History Since Last Visit Added or deleted any medications: No Patient Arrived: Walker Any new allergies or adverse reactions: No Arrival Time: 09:30 Had a fall or experienced change in No Accompanied By: self activities of daily living that may affect Transfer Assistance: None risk of falls: Patient Identification Verified: Yes Signs or symptoms of abuse/neglect since last No Secondary Verification Process Yes visito Completed: Hospitalized since last visit: No Patient Requires Transmission- No Pain Present Now: No Based Precautions: Patient Has Alerts: Yes Patient Alerts: Patient on Blood Thinner Electronic Signature(s) Signed: 04/11/2016 2:42:00 PM By: Montey Hora Entered By: Montey Hora on 04/11/2016 09:30:46 [name] ([id number]) -------------------------------------------------------------------------------- Clinic Level of Care Assessment Details [name]. Date of Service: 04/11/2016 9:15 AM Medical Record Number: [id number] Patient Account Number: [id number] Date of Birth/Sex: [birth year] (60 y.o. Female) Treating RN: Montey Hora Primary Care Physician: [name] Other Clinician: Referring Physician: [name] Treating Physician/Extender: Frann Rider in Treatment: 61 Clinic Level of Care Assessment Items TOOL 4 Quantity Score []  - Use when only an EandM is performed on FOLLOW-UP visit 0 ASSESSMENTS - Nursing Assessment / Reassessment X - Reassessment of  Co-morbidities (includes updates in patient status) 1 10 X - Reassessment of Adherence to Treatment Plan 1 5 ASSESSMENTS - Wound and Skin Assessment / Reassessment X - Simple Wound Assessment / Reassessment - one wound 1 5 []  - Complex Wound Assessment / Reassessment - multiple wounds 0 []  - Dermatologic / Skin Assessment (not related to wound area) 0 ASSESSMENTS - Focused Assessment []  - Circumferential Edema Measurements - multi extremities 0 []  - Nutritional Assessment / Counseling / Intervention 0 []  - Lower Extremity Assessment (monofilament, tuning fork, pulses) 0 []  - Peripheral Arterial Disease Assessment (using hand held doppler) 0 ASSESSMENTS - Ostomy and/or Continence Assessment and Care []  - Incontinence Assessment and Management 0 []  - Ostomy Care Assessment and Management (repouching, etc.) 0 PROCESS - Coordination of Care X - Simple Patient / Family Education for ongoing care 1 15 []  - Complex (extensive) Patient / Family Education for ongoing care 0 []  - Staff obtains Programmer, systems, Records, Test Results / Process Orders 0 []  - Staff telephones HHA, Nursing Homes / Clarify orders / etc 0 []  - Routine Transfer to another Facility (non-emergent condition) 0 [place], [name] ([id number]) []  - Routine Hospital Admission (non-emergent condition) 0 []  - New Admissions / Biomedical engineer / Ordering NPWT, Apligraf, etc. 0 []  - Emergency Hospital Admission (emergent condition) 0 X - Simple Discharge Coordination 1 10 []  - Complex (extensive) Discharge Coordination 0 PROCESS - Special Needs []  - Pediatric / Minor Patient Management 0 []  - Isolation Patient Management 0 []  - Hearing / Language / Visual special needs 0 []  - Assessment of Community assistance (transportation, D/C planning, etc.) 0 []  - Additional assistance / Altered mentation 0 []  - Support Surface(s) Assessment (bed, cushion, seat, etc.) 0 INTERVENTIONS - Wound Cleansing / Measurement X - Simple Wound  Cleansing - one wound 1 5 []  - Complex Wound Cleansing - multiple wounds 0 X - Wound Imaging (photographs - any number of wounds) 1 5 []  - Wound  Tracing (instead of photographs) 0 X - Simple Wound Measurement - one wound 1 5 []  - Complex Wound Measurement - multiple wounds 0 INTERVENTIONS - Wound Dressings X - Small Wound Dressing one or multiple wounds 1 10 []  - Medium Wound Dressing one or multiple wounds 0 []  - Large Wound Dressing one or multiple wounds 0 []  - Application of Medications - topical 0 []  - Application of Medications - injection 0 INTERVENTIONS - Miscellaneous []  - External ear exam 0 [place], [name] ([id number]) []  - Specimen Collection (cultures, biopsies, blood, body fluids, etc.) 0 []  - Specimen(s) / Culture(s) sent or taken to Lab for analysis 0 []  - Patient Transfer (multiple staff / Harrel Lemon Lift / Similar devices) 0 []  - Simple Staple / Suture removal (25 or less) 0 []  - Complex Staple / Suture removal (26 or more) 0 []  - Hypo / Hyperglycemic Management (close monitor of Blood Glucose) 0 []  - Ankle / Brachial Index (ABI) - do not check if billed separately 0 X - Vital Signs 1 5 Has the patient been seen at the hospital within the last three years: Yes Total Score: 75 Level Of Care: New/Established - Level 2 Electronic Signature(s) Signed: 04/11/2016 2:42:00 PM By: Montey Hora Entered By: Montey Hora on 04/11/2016 10:00:55 [name] ([id number]) -------------------------------------------------------------------------------- Encounter Discharge Information Details [name], [name] Date of Service: 04/11/2016 9:15 AM Medical Record Number: [id number] Patient Account Number: [id number] Date of Birth/Sex: 1956-05-05 (60 y.o. Female) Treating RN: Montey Hora Primary Care Physician: [name] Other Clinician: Referring Physician: [name] Treating Physician/Extender: Frann Rider in Treatment: 42 Encounter Discharge  Information Items Discharge Pain Level: 0 Discharge Condition: Stable Ambulatory Status: Walker Discharge Destination: Nursing Home Transportation: Private Auto Accompanied By: self Schedule Follow-up Appointment: Yes Medication Reconciliation completed No and provided to Patient/Care Damaria Stofko: Provided on Clinical Summary of Care: 04/11/2016 Form Type Recipient Paper Patient LD Electronic Signature(s) Signed: 04/11/2016 2:42:00 PM By: Montey Hora Previous Signature: 04/11/2016 10:00:34 AM Version By: Ruthine Dose Entered By: Montey Hora on 04/11/2016 10:01:47 Cremeans, Christean [name] ([id number]) -------------------------------------------------------------------------------- Multi Wound Chart Details [name]. Date of Service: 04/11/2016 9:15 AM Medical Record Number: [id number] Patient Account Number: [id number] Date of Birth/Sex: [birth year] (60 y.o. Female) Treating RN: Montey Hora Primary Care Physician: [name] Other Clinician: Referring Physician: [name] Treating Physician/Extender: Frann Rider in Treatment: 31 Vital Signs Height(in): 65 Pulse(bpm): 90 Weight(lbs): 156 Blood Pressure 142/57 (mmHg): Body Mass Index(BMI): 26 Temperature(F): 98.2 Respiratory Rate 18 (breaths/min): Photos: [N/A:N/A] Wound Location: Right Back N/A N/A Wounding Event: Blister N/A N/A Primary Etiology: Pressure Ulcer N/A N/A Comorbid History: Glaucoma, Anemia, N/A N/A Coronary Artery Disease, Hypertension, Type II Diabetes, End Stage Renal Disease, Osteoarthritis, Neuropathy Date Acquired: 10/19/2013 N/A N/A Weeks of Treatment: 67 N/A N/A Wound Status: Open N/A N/A Measurements L x W x D 1x0.6x0.1 N/A N/A (cm) Area (cm) : 0.471 N/A N/A Volume (cm) : 0.047 N/A N/A % Reduction in Area: 98.80% N/A N/A % Reduction in Volume: 98.80% N/A N/A Classification: Category/Stage II N/A N/A Exudate Amount: Medium N/A N/A Exudate Type:  Sanguinous N/A N/A Exudate Color: red N/A N/A Wound Margin: Flat and Intact N/A N/A [place], [name] ([id number]) Granulation Amount: Large (67-100%) N/A N/A Granulation Quality: Red, Friable N/A N/A Necrotic Amount: None Present (0%) N/A N/A Exposed Structures: Fascia: No N/A N/A Fat: No Tendon: No Muscle: No Joint: No Bone: No Limited to Skin Breakdown Epithelialization: Small (1-33%) N/A N/A  Periwound Skin Texture: Scarring: Yes N/A N/A Edema: No Excoriation: No Induration: No Callus: No Crepitus: No Fluctuance: No Friable: No Rash: No Periwound Skin Moist: Yes N/A N/A Moisture: Maceration: No Dry/Scaly: No Periwound Skin Color: Ecchymosis: Yes N/A N/A Atrophie Blanche: No Cyanosis: No Erythema: No Hemosiderin Staining: No Mottled: No Pallor: No Rubor: No Temperature: No Abnormality N/A N/A Tenderness on No N/A N/A Palpation: Wound Preparation: Ulcer Cleansing: N/A N/A Rinsed/Irrigated with Saline Topical Anesthetic Applied: None Treatment Notes Electronic Signature(s) Signed: 04/11/2016 2:42:00 PM By: Montey Hora Entered By: Montey Hora on 04/11/2016 09:59:08 Ritsema, Christean [name] ([id number]) -------------------------------------------------------------------------------- [place] Details [name]. Date of Service: 04/11/2016 9:15 AM Medical Record Number: [id number] Patient Account Number: [id number] Date of Birth/Sex: 03/29/1956 (60 y.o. Female) Treating RN: Montey Hora Primary Care Physician: [name] Other Clinician: Referring Physician: [name] Treating Physician/Extender: Frann Rider in Treatment: 62 Active Inactive Abuse / Safety / Falls / Self Care Management Nursing Diagnoses: Potential for falls Goals: Patient will remain injury free Date Initiated: 12/23/2014 Goal Status: Active Patient/caregiver will verbalize understanding of skin care regimen Date Initiated: 12/23/2014 Goal  Status: Active Patient/caregiver will verbalize/demonstrate measures taken to prevent injury and/or falls Date Initiated: 12/23/2014 Goal Status: Active Patient/caregiver will verbalize/demonstrate understanding of what to do in case of emergency Date Initiated: 12/23/2014 Goal Status: Active Interventions: Assess fall risk on admission and as needed Assess: immobility, friction, shearing, incontinence upon admission and as needed Assess impairment of mobility on admission and as needed per policy Assess self care needs on admission and as needed Provide education on fall prevention Notes: Wound/Skin Impairment Nursing Diagnoses: Impaired tissue integrity Knowledge deficit related to ulceration/compromised skin integrity Goals: Patient/caregiver will verbalize understanding of skin care regimen KAYLOR, DONNALLY ([id number]) Date Initiated: 12/23/2014 Goal Status: Active Ulcer/skin breakdown will heal within 14 weeks Date Initiated: 12/23/2014 Goal Status: Active Interventions: Assess patient/caregiver ability to obtain necessary supplies Assess patient/caregiver ability to perform ulcer/skin care regimen upon admission and as needed Assess ulceration(s) every visit Provide education on ulcer and skin care Treatment Activities: Skin care regimen initiated : 11/05/2015 Topical wound management initiated : 11/05/2015 Notes: Electronic Signature(s) Signed: 04/11/2016 2:42:00 PM By: Montey Hora Entered By: Montey Hora on 04/11/2016 09:53:28 Rada, Hallee S. ([id number]) -------------------------------------------------------------------------------- Pain Assessment Details [name]. Date of Service: 04/11/2016 9:15 AM Medical Record Number: [id number] Patient Account Number: [id number] Date of Birth/Sex: [birth year] (60 y.o. Female) Treating RN: Montey Hora Primary Care Physician: [name] Other Clinician: Referring Physician: [name] Treating  Physician/Extender: Frann Rider in Treatment: 67 Active Problems Location of Pain Severity and Description of Pain Patient Has Paino No Site Locations Pain Management and Medication Current Pain Management: Notes Topical or injectable lidocaine is offered to patient for acute pain when surgical debridement is performed. If needed, Patient is instructed to use over the counter pain medication for the following 24-48 hours after debridement. Wound care MDs do not prescribed pain medications. Patient has chronic pain or uncontrolled pain. Patient has been instructed to make an appointment with their Primary Care Physician for pain management. Electronic Signature(s) Signed: 04/11/2016 2:42:00 PM By: Montey Hora Entered By: Montey Hora on 04/11/2016 09:30:56 [place], Christean [name] ([id number]) -------------------------------------------------------------------------------- Patient/Caregiver Education Details [name]. Date of Service: 04/11/2016 9:15 AM Medical Record Number: [id number] Patient Account Number: [id number] Date of Birth/Gender: 12/08/1955 (60 y.o. Female) Treating RN: Montey Hora Primary Care Physician: [name] Other Clinician: Referring Physician: Brynda Greathouse  ERNEST Treating Physician/Extender: Frann Rider in Treatment: 87 Education Assessment Education Provided To: Patient Education Topics Provided Wound/Skin Impairment: Handouts: Other: wound care as ordered Methods: Explain/Verbal Responses: State content correctly Electronic Signature(s) Signed: 04/11/2016 2:42:00 PM By: Montey Hora Entered By: Montey Hora on 04/11/2016 10:02:02 [place], Christean [name] ([id number]) -------------------------------------------------------------------------------- Wound Assessment Details [name], [name] Date of Service: 04/11/2016 9:15 AM Medical Record Number: [id number] Patient Account Number: [id number] Date of Birth/Sex:  11-06-1955 (60 y.o. Female) Treating RN: Montey Hora Primary Care Physician: [name] Other Clinician: Referring Physician: [name] Treating Physician/Extender: Frann Rider in Treatment: 67 Wound Status Wound Number: 1 Primary Pressure Ulcer Etiology: Wound Location: Right Back Wound Open Wounding Event: Blister Status: Date Acquired: 10/19/2013 Comorbid Glaucoma, Anemia, Coronary Artery Weeks Of Treatment: 67 History: Disease, Hypertension, Type II Clustered Wound: No Diabetes, End Stage Renal Disease, Osteoarthritis, Neuropathy Photos Wound Measurements Length: (cm) 1 Width: (cm) 0.6 Depth: (cm) 0.1 Area: (cm) 0.471 Volume: (cm) 0.047 % Reduction in Area: 98.8% % Reduction in Volume: 98.8% Epithelialization: Small (1-33%) Tunneling: No Undermining: No Wound Description Classification: Category/Stage II Wound Margin: Flat and Intact Exudate Amount: Medium Exudate Type: Sanguinous Exudate Color: red Foul Odor After Cleansing: No Wound Bed Granulation Amount: Large (67-100%) Exposed Structure Granulation Quality: Red, Friable Fascia Exposed: No Necrotic Amount: None Present (0%) Fat Layer Exposed: No Tendon Exposed: No Spang, [name] ([id number]) Muscle Exposed: No Joint Exposed: No Bone Exposed: No Limited to Skin Breakdown Periwound Skin Texture Texture Color No Abnormalities Noted: No No Abnormalities Noted: No Callus: No Atrophie Blanche: No Crepitus: No Cyanosis: No Excoriation: No Ecchymosis: Yes Fluctuance: No Erythema: No Friable: No Hemosiderin Staining: No Induration: No Mottled: No Localized Edema: No Pallor: No Rash: No Rubor: No Scarring: Yes Temperature / Pain Moisture Temperature: No Abnormality No Abnormalities Noted: No Dry / Scaly: No Maceration: No Moist: Yes Wound Preparation Ulcer Cleansing: Rinsed/Irrigated with Saline Topical Anesthetic Applied: None Treatment Notes Wound #1 (Right Back) 1.  Cleansed with: Clean wound with Normal Saline 3. Peri-wound Care: Skin Prep 4. Dressing Applied: Other dressing (specify in notes) 5. Secondary Dressing Applied Non-Adherent pad [place] Notes poly mem Electronic Signature(s) Signed: 04/11/2016 2:42:00 PM By: Montey Hora Entered By: Montey Hora on 04/11/2016 09:39:36 [place], Christean [name] ([id number]) -------------------------------------------------------------------------------- Vitals Details [name]. Date of Service: 04/11/2016 9:15 AM Medical Record Number: [id number] Patient Account Number: [id number] Date of Birth/Sex: 1956-03-15 (60 y.o. Female) Treating RN: Montey Hora Primary Care Physician: [name] Other Clinician: Referring Physician: [name] Treating Physician/Extender: Frann Rider in Treatment: 30 Vital Signs Time Taken: 09:31 Temperature (F): 98.2 Height (in): 65 Pulse (bpm): 90 Weight (lbs): 156 Respiratory Rate (breaths/min): 18 Body Mass Index (BMI): 26 Blood Pressure (mmHg): 142/57 Reference Range: 80 - 120 mg / dl Electronic Signature(s) Signed: 04/11/2016 2:42:00 PM By: Montey Hora Entered By: Montey Hora on 04/11/2016 09:32:58

## 2016-04-11 NOTE — Progress Notes (Addendum)
[name] ([id number]) Visit Report for 04/11/2016 Chief Complaint Document Details [name], [name]. Date of Service: 04/11/2016 9:15 AM Medical Record Number: [id number] Patient Account Number: [id number] Date of Birth/Sex: 23-Apr-1956 (60 y.o. Female) Treating RN: Montey Hora Primary Care Physician: [name] Other Clinician: Referring Physician: [name] Treating Physician/Extender: Frann Rider in Treatment: 42 Information Obtained from: Patient Chief Complaint Patient presents to the wound care center for a consult due non healing wound. She has an open wound on her right upper back which she's had for about a year and she recently noticed a blister on her right lower extremity about 2 weeks ago. Electronic Signature(s) Signed: 04/11/2016 9:54:36 AM By: [name], [name] Entered By: [name] on 04/11/2016 09:54:35 [name], [name] ([id number]) -------------------------------------------------------------------------------- HPI Details [name]. Date of Service: 04/11/2016 9:15 AM Medical Record Number: [id number] Patient Account Number: [id number] Date of Birth/Sex: 12-Apr-1956 (60 y.o. Female) Treating RN: Montey Hora Primary Care Physician: [name] Other Clinician: Referring Physician: [name] Treating Physician/Extender: Frann Rider in Treatment: 50 History of Present Illness Location: right upper back and right lower extremity wounds Quality: Patient reports No Pain. Severity: Patient states wound (s) are getting better. Duration: Patient has had the wound for > 3 months prior to seeking treatment at the wound center Timing: she thought it first occurred when she was using a heating pad about a year ago after back surgery. Context: The wound appeared gradually over time Modifying Factors: Patient is currently on renal dialysis and receives treatments 3 times weekly Associated Signs and  Symptoms: Patient reports having: surgery scheduled for this week for a AV fistula left arm. HPI Description: 60 year old patient who is known to be a diabetic and has end-stage renal disease has had several comorbidities including coronary artery disease, hypertension, hyperlipidemia, pancreatitis, anemia, previous history of hysterectomy, cholecystectomy, left-sided salivary gland excision, bilateral cataract surgery,Peritoneal dialysis catheter, hemodialysis catheter. the area on the back has also been caused by instant pressure she used to sleep on a recliner all day and has significant kyphoscoliosis. As far as the wound on her right lower extremity she's not sure how this blister occurred but she thought it has been there for about 2 weeks. No recent blood investigations available and no recent hemoglobin A1c. 12/30/2014 -- she is an assisted living facility but I believe the nurses that have not followed instructions as she had some cream applied on her back and there was a different dressing. Last week she's had a AV fistula placed on her left forearm. 01/13/2015 -- she has had some localized infection at the port site and she's been on doxycycline for this. 02/05/2015 - he has developed a small blister on her right lower extremity. 02/10/2015 -- she has developed another small blister on her right anterior chest wall in the area where she's had tape for her dialysis access. this may just be injury caused by a tape burn. 02/18/2015 -- no new blisters and she had a dermatology opinion and they have taken a biopsy of her skin. She also had a left brachial AV fistula placed this week. 03/03/2015 -- though we do not have the pathology report yet the patient says she has been put on prednisone because this skin disease is possibly to do with her immune system and her dermatologist is recommended this. 03/10/2015 -- she has developed a new blister which is quite large on her right lower  extremity on the shin.  05/22/2015 -- she did very well with her dressing but on trying to remove the [place] it peels away the newly healed skin. [name], [name] ([id number]) 06/05/2015 -- the patient was in the ER this past week for severe bleeding from the right nostril and had to have ENT see her and do a packing. They've also been holding her heparin during her dialysis. The patient is going back to ENT today. Other than that she has had no other significant issues. 06/19/2015 -- she is back on her blood thinners and continues to have her hemodialysis as before. 07/31/2015 -- a large blister has popped up again on her right lower extremity in the same place in the mid shin area in the anterior lateral compartment. 10/05/2015 -- last week she was admitted to the hospital for 4 days as she had a pneumonia and was treated with IV and oral antibiotics. Electronic Signature(s) Signed: 04/11/2016 9:54:42 AM By: [name], [name] Entered By: [name] on 04/11/2016 09:54:42 [name], [name] ([id number]) -------------------------------------------------------------------------------- Physical Exam Details [name], 17. Date of Service: 04/11/2016 9:15 AM Medical Record Number: [id number] Patient Account Number: [id number] Date of Birth/Sex: 16-Nov-1955 (60 y.o. Female) Treating RN: Montey Hora Primary Care Physician: [name] Other Clinician: Referring Physician: [name] Treating Physician/Extender: Frann Rider in Treatment: 34 Constitutional . Pulse regular. Respirations normal and unlabored. Afebrile. . Eyes Nonicteric. Reactive to light. Ears, Nose, Mouth, and Throat Lips, teeth, and gums WNL.Marland Kitchen Moist mucosa without lesions. Neck supple and nontender. No palpable supraclavicular or cervical adenopathy. Normal sized without goiter. Respiratory WNL. No retractions.. Breath sounds WNL, No rubs, rales, rhonchi, or  wheeze.. Cardiovascular Heart rhythm and rate regular, no murmur or gallop.. Pedal Pulses WNL. No clubbing, cyanosis or edema. Lymphatic No adneopathy. No adenopathy. No adenopathy. Musculoskeletal Adexa without tenderness or enlargement.. Digits and nails w/o clubbing, cyanosis, infection, petechiae, ischemia, or inflammatory conditions.. Integumentary (Hair, Skin) No suspicious lesions. No crepitus or fluctuance. No peri-wound warmth or erythema. No masses.Marland Kitchen Psychiatric Judgement and insight Intact.. No evidence of depression, anxiety, or agitation.. Notes she has a small open area smaller than a postage stamp and the rest of the skin surrounding it as he got supple scar which is healing well. Electronic Signature(s) Signed: 04/11/2016 9:55:14 AM By: [name], [name] Entered By: [name] on 04/11/2016 09:55:14 [name], [name] ([id number]) -------------------------------------------------------------------------------- Physician Orders Details [name]. Date of Service: 04/11/2016 9:15 AM Medical Record Number: [id number] Patient Account Number: [id number] Date of Birth/Sex: 1956/04/16 (60 y.o. Female) Treating RN: Montey Hora Primary Care Physician: [name] Other Clinician: Referring Physician: [name] Treating Physician/Extender: Frann Rider in Treatment: 52 Verbal / Phone Orders: Yes Clinician: Montey Hora Read Back and Verified: Yes Diagnosis Coding ICD-10 Coding Code Description E11.622 Type 2 diabetes mellitus with other skin ulcer L89.112 Pressure ulcer of right upper back, stage 2 S80.821A Blister (nonthermal), right lower leg, initial encounter N18.6 End stage renal disease Wound Cleansing Wound #1 Right Back o Clean wound with Normal Saline. o May Shower, gently pat wound dry prior to applying new dressing. - PATIENT MAY SHOWER AND [place] WOUND WITH SOAP AND WATER WITH CNA ASSISTANCE AND PAT WOUND DRY AND  SNF NURSE TO APPLY NEW DRESSING RIGHT AFTERWARDS Skin Barriers/Peri-Wound Care Wound #1 Right Back o Skin Prep Primary Wound Dressing Wound #1 Right Back o Other: - Poly Mem - Please wet prior to removing; Cover with [place]  Frequency Wound #1 Right Back o Other: - Change when soiled Follow-up Appointments Wound #1 Right Back o Return Appointment in 1 week. Electronic Signature(s) Signed: 04/11/2016 2:42:00 PM By: Montey Hora Signed: 04/11/2016 3:04:46 PM By: [name], [name] [name], [name] ([id number]) Entered By: Montey Hora on 04/11/2016 10:00:11 [name], [name] ([id number]) -------------------------------------------------------------------------------- Problem List Details Patient Name: ESTLE, TRZCINSKI. Date of Service: 04/11/2016 9:15 AM Medical Record Number: [id number] Patient Account Number: [id number] Date of Birth/Sex: 09/22/1955 (60 y.o. Female) Treating RN: Montey Hora Primary Care Physician: [name] Other Clinician: Referring Physician: [name] Treating Physician/Extender: Frann Rider in Treatment: 33 Active Problems ICD-10 Encounter Code Description Active Date Diagnosis E11.622 Type 2 diabetes mellitus with other skin ulcer 12/23/2014 Yes L89.112 Pressure ulcer of right upper back, stage 2 12/23/2014 Yes S80.821A Blister (nonthermal), right lower leg, initial encounter 12/23/2014 Yes N18.6 End stage renal disease 12/23/2014 Yes Inactive Problems Resolved Problems Electronic Signature(s) Signed: 04/11/2016 9:54:27 AM By: [name], [name] Entered By: [name] on 04/11/2016 09:54:27 [name], [name] ([id number]) -------------------------------------------------------------------------------- Progress Note Details [name], [name] Date of Service: 04/11/2016 9:15 AM Medical Record Number: [id number] Patient Account Number: [id number] Date of Birth/Sex: 1956-01-24 (60 y.o.  Female) Treating RN: Montey Hora Primary Care Physician: [name] Other Clinician: Referring Physician: [name] Treating Physician/Extender: Frann Rider in Treatment: 6 Subjective Chief Complaint Information obtained from Patient Patient presents to the wound care center for a consult due non healing wound. She has an open wound on her right upper back which she's had for about a year and she recently noticed a blister on her right lower extremity about 2 weeks ago. History of Present Illness (HPI) The following HPI elements were documented for the patient's wound: Location: right upper back and right lower extremity wounds Quality: Patient reports No Pain. Severity: Patient states wound (s) are getting better. Duration: Patient has had the wound for > 3 months prior to seeking treatment at the wound center Timing: she thought it first occurred when she was using a heating pad about a year ago after back surgery. Context: The wound appeared gradually over time Modifying Factors: Patient is currently on renal dialysis and receives treatments 3 times weekly Associated Signs and Symptoms: Patient reports having: surgery scheduled for this week for a AV fistula left arm. 60 year old patient who is known to be a diabetic and has end-stage renal disease has had several comorbidities including coronary artery disease, hypertension, hyperlipidemia, pancreatitis, anemia, previous history of hysterectomy, cholecystectomy, left-sided salivary gland excision, bilateral cataract surgery,Peritoneal dialysis catheter, hemodialysis catheter. the area on the back has also been caused by instant pressure she used to sleep on a recliner all day and has significant kyphoscoliosis. As far as the wound on her right lower extremity she's not sure how this blister occurred but she thought it has been there for about 2 weeks. No recent blood investigations available and no recent  hemoglobin A1c. 12/30/2014 -- she is an assisted living facility but I believe the nurses that have not followed instructions as she had some cream applied on her back and there was a different dressing. Last week she's had a AV fistula placed on her left forearm. 01/13/2015 -- she has had some localized infection at the port site and she's been on doxycycline for this. 02/05/2015 - he has developed a small blister on her right lower extremity. 02/10/2015 -- she has developed another small blister on her right anterior chest  wall in the area where she's had tape for her dialysis access. this may just be injury caused by a tape burn. 02/18/2015 -- no new blisters and she had a dermatology opinion and they have taken a biopsy of her skin. [name], [name] ([id number]) She also had a left brachial AV fistula placed this week. 03/03/2015 -- though we do not have the pathology report yet the patient says she has been put on prednisone because this skin disease is possibly to do with her immune system and her dermatologist is recommended this. 03/10/2015 -- she has developed a new blister which is quite large on her right lower extremity on the shin. 05/22/2015 -- she did very well with her dressing but on trying to remove the [place] it peels away the newly healed skin. 06/05/2015 -- the patient was in the ER this past week for severe bleeding from the right nostril and had to have ENT see her and do a packing. They've also been holding her heparin during her dialysis. The patient is going back to ENT today. Other than that she has had no other significant issues. 06/19/2015 -- she is back on her blood thinners and continues to have her hemodialysis as before. 07/31/2015 -- a large blister has popped up again on her right lower extremity in the same place in the mid shin area in the anterior lateral compartment. 10/05/2015 -- last week she was admitted to the hospital for 4 days as she had a  pneumonia and was treated with IV and oral antibiotics. Objective Constitutional Pulse regular. Respirations normal and unlabored. Afebrile. Vitals Time Taken: 9:31 AM, Height: 65 in, Weight: 156 lbs, BMI: 26, Temperature: 98.2 F, Pulse: 90 bpm, Respiratory Rate: 18 breaths/min, Blood Pressure: 142/57 mmHg. Eyes Nonicteric. Reactive to light. Ears, Nose, Mouth, and Throat Lips, teeth, and gums WNL.Marland Kitchen Moist mucosa without lesions. Neck supple and nontender. No palpable supraclavicular or cervical adenopathy. Normal sized without goiter. Respiratory WNL. No retractions.. Breath sounds WNL, No rubs, rales, rhonchi, or wheeze.. Cardiovascular Heart rhythm and rate regular, no murmur or gallop.. Pedal Pulses WNL. No clubbing, cyanosis or edema. Lymphatic No adneopathy. No adenopathy. No adenopathy. [name] ([id number]) Musculoskeletal Adexa without tenderness or enlargement.. Digits and nails w/o clubbing, cyanosis, infection, petechiae, ischemia, or inflammatory conditions.Marland Kitchen Psychiatric Judgement and insight Intact.. No evidence of depression, anxiety, or agitation.. General Notes: she has a small open area smaller than a postage stamp and the rest of the skin surrounding it as he got supple scar which is healing well. Integumentary (Hair, Skin) No suspicious lesions. No crepitus or fluctuance. No peri-wound warmth or erythema. No masses.. Wound #1 status is Open. Original cause of wound was Blister. The wound is located on the Right Back. The wound measures 1cm length x 0.6cm width x 0.1cm depth; 0.471cm^2 area and 0.047cm^3 volume. The wound is limited to skin breakdown. There is no tunneling or undermining noted. There is a medium amount of sanguinous drainage noted. The wound margin is flat and intact. There is large (67-100%) red, friable granulation within the wound bed. There is no necrotic tissue within the wound bed. The periwound skin appearance exhibited: Scarring,  Moist, Ecchymosis. The periwound skin appearance did not exhibit: Callus, Crepitus, Excoriation, Fluctuance, Friable, Induration, Localized Edema, Rash, Dry/Scaly, Maceration, Atrophie Blanche, Cyanosis, Hemosiderin Staining, Mottled, Pallor, Rubor, Erythema. Periwound temperature was noted as No Abnormality. Assessment Active Problems ICD-10 E11.622 - Type 2 diabetes mellitus with other skin ulcer L89.112 - Pressure  ulcer of right upper back, stage 2 S80.821A - Blister (nonthermal), right lower leg, initial encounter N18.6 - End stage renal disease Plan Wound Cleansing: Wound #1 Right Back: Clean wound with Normal Saline. May Shower, gently pat wound dry prior to applying new dressing. - PATIENT MAY SHOWER AND [place] WITH SOAP AND WATER WITH CNA ASSISTANCE AND PAT WOUND DRY AND SNF [name], [name] ([id number]) NURSE TO APPLY NEW DRESSING RIGHT AFTERWARDS Skin Barriers/Peri-Wound Care: Wound #1 Right Back: Skin Prep Primary Wound Dressing: Wound #1 Right Back: Other: - Poly Mem - Please wet prior to removing; Cover with [facility] Dressing Dressing Change Frequency: Wound #1 Right Back: Other: - Change when soiled Follow-up Appointments: Wound #1 Right Back: Return Appointment in 1 week. In view of the fragile skin I have been using [employer] Ultra silver foam, but we do not have the material today. Last week she's had an excellent result with this. I have recommended Polymem silver foam to change it only twice a week if possible. Electronic Signature(s) Signed: 04/11/2016 3:10:57 PM By: [name], [name] Previous Signature: 04/11/2016 9:57:04 AM Version By: [name], [name] Entered By: [name] on 04/11/2016 15:10:56 [name], [name] ([id number]) -------------------------------------------------------------------------------- SuperBill Details [name]. Date of Service: 04/11/2016 Medical Record Number: [id number] Patient Account Number:  [id number] Date of Birth/Sex: 04/11/1956 (60 y.o. Female) Treating RN: Montey Hora Primary Care Physician: [name] Other Clinician: Referring Physician: [name] Treating Physician/Extender: Frann Rider in Treatment: 67 Diagnosis Coding ICD-10 Codes Code Description E11.622 Type 2 diabetes mellitus with other skin ulcer L89.112 Pressure ulcer of right upper back, stage 2 S80.821A Blister (nonthermal), right lower leg, initial encounter N18.6 End stage renal disease Facility Procedures CPT4 Code: [id number] Description: [phone] - WOUND CARE VISIT-LEV 2 EST PT Modifier: Quantity: 1 Physician Procedures CPT4 Code: [id number] Description: [id number] - WC PHYS LEVEL 3 - EST PT ICD-10 Description Diagnosis E11.622 Type 2 diabetes mellitus with other skin ulcer L89.112 Pressure ulcer of right upper back, stage 2 S80.821A Blister (nonthermal), right lower leg, initial enc N18.6 End  stage renal disease Modifier: ounter Quantity: 1 Electronic Signature(s) Signed: 04/11/2016 2:42:00 PM By: Montey Hora Signed: 04/11/2016 3:04:46 PM By: [name], [name] Previous Signature: 04/11/2016 9:57:35 AM Version By: [name], [name] Entered By: Montey Hora on 04/11/2016 10:01:04

## 2016-04-22 ENCOUNTER — Encounter: Payer: Medicare Other | Attending: Surgery | Admitting: Nurse Practitioner

## 2016-04-22 DIAGNOSIS — E11622 Type 2 diabetes mellitus with other skin ulcer: Secondary | ICD-10-CM | POA: Diagnosis not present

## 2016-04-22 DIAGNOSIS — L89112 Pressure ulcer of right upper back, stage 2: Secondary | ICD-10-CM | POA: Diagnosis not present

## 2016-04-22 DIAGNOSIS — N186 End stage renal disease: Secondary | ICD-10-CM | POA: Insufficient documentation

## 2016-04-22 DIAGNOSIS — S80821A Blister (nonthermal), right lower leg, initial encounter: Secondary | ICD-10-CM | POA: Insufficient documentation

## 2016-04-22 DIAGNOSIS — X58XXXA Exposure to other specified factors, initial encounter: Secondary | ICD-10-CM | POA: Diagnosis not present

## 2016-04-22 DIAGNOSIS — D649 Anemia, unspecified: Secondary | ICD-10-CM | POA: Diagnosis not present

## 2016-04-22 DIAGNOSIS — Z992 Dependence on renal dialysis: Secondary | ICD-10-CM | POA: Diagnosis not present

## 2016-04-22 DIAGNOSIS — E1122 Type 2 diabetes mellitus with diabetic chronic kidney disease: Secondary | ICD-10-CM | POA: Diagnosis not present

## 2016-04-22 DIAGNOSIS — E785 Hyperlipidemia, unspecified: Secondary | ICD-10-CM | POA: Diagnosis not present

## 2016-04-22 DIAGNOSIS — I120 Hypertensive chronic kidney disease with stage 5 chronic kidney disease or end stage renal disease: Secondary | ICD-10-CM | POA: Insufficient documentation

## 2016-04-22 DIAGNOSIS — I2510 Atherosclerotic heart disease of native coronary artery without angina pectoris: Secondary | ICD-10-CM | POA: Diagnosis not present

## 2016-04-23 NOTE — Progress Notes (Addendum)
[name], [name] ([id number]) Visit Report for 04/22/2016 Chief Complaint Document Details Patient Name: [name], [name]. Date of Service: 04/22/2016 9:15 AM Medical Record Number: [id number] Patient Account Number: [id number] Date of Birth/Sex: 06-08-1956 (60 y.o. Female) Treating RN: Cornell Barman Primary Care Physician: [name] Other Clinician: Referring Physician: [name] Treating Physician/Extender: Loistine Chance in Treatment: 69 Information Obtained from: Patient Chief Complaint Patient presents to the wound care center for a consult due non healing wound. She has an open wound on her right upper back which she's had for about a year and she recently noticed a blister on her right lower extremity about 2 weeks ago. Electronic Signature(s) Signed: 04/22/2016 4:21:25 PM By: Londell Moh FNP Entered By: Londell Moh on 04/22/2016 09:46:09 [name], [name] ([id number]) -------------------------------------------------------------------------------- HPI Details Patient Name: [name], [name]. Date of Service: 04/22/2016 9:15 AM Medical Record Number: [id number] Patient Account Number: [id number] Date of Birth/Sex: [birth year] (60 y.o. Female) Treating RN: Cornell Barman Primary Care Physician: [name] Other Clinician: Referring Physician: [name] Treating Physician/Extender: Loistine Chance in Treatment: 69 History of Present Illness Location: right upper back and right lower extremity wounds Quality: Patient reports No Pain. Severity: Patient states wound (s) are getting better. Duration: Patient has had the wound for > 3 months prior to seeking treatment at the wound center Timing: she thought it first occurred when she was using a heating pad about a year ago after back surgery. Context: The wound appeared gradually over time Modifying Factors: Patient is currently on renal dialysis and receives treatments 3 times weekly Associated Signs and  Symptoms: Patient reports having: surgery scheduled for this week for a AV fistula left arm. HPI Description: 60 year old patient who is known to be a diabetic and has end-stage renal disease has had several comorbidities including coronary artery disease, hypertension, hyperlipidemia, pancreatitis, anemia, previous history of hysterectomy, cholecystectomy, left-sided salivary gland excision, bilateral cataract surgery,Peritoneal dialysis catheter, hemodialysis catheter. the area on the back has also been caused by instant pressure she used to sleep on a recliner all day and has significant kyphoscoliosis. As far as the wound on her right lower extremity she's not sure how this blister occurred but she thought it has been there for about 2 weeks. No recent blood investigations available and no recent hemoglobin A1c. 12/30/2014 -- she is an assisted living facility but I believe the nurses that have not followed instructions as she had some cream applied on her back and there was a different dressing. Last week she's had a AV fistula placed on her left forearm. 01/13/2015 -- she has had some localized infection at the port site and she's been on doxycycline for this. 02/05/2015 - he has developed a small blister on her right lower extremity. 02/10/2015 -- she has developed another small blister on her right anterior chest wall in the area where she's had tape for her dialysis access. this may just be injury caused by a tape burn. 02/18/2015 -- no new blisters and she had a dermatology opinion and they have taken a biopsy of her skin. She also had a left brachial AV fistula placed this week. 03/03/2015 -- though we do not have the pathology report yet the patient says she has been put on prednisone because this skin disease is possibly to do with her immune system and her dermatologist is recommended this. 03/10/2015 -- she has developed a new blister which is quite large on her right lower  extremity on the shin. 05/22/2015 --  she did very well with her dressing but on trying to remove the [place] it peels away the newly healed skin. [name], [name] ([id number]) 06/05/2015 -- the patient was in the ER this past week for severe bleeding from the right nostril and had to have ENT see her and do a packing. They've also been holding her heparin during her dialysis. The patient is going back to ENT today. Other than that she has had no other significant issues. 06/19/2015 -- she is back on her blood thinners and continues to have her hemodialysis as before. 07/31/2015 -- a large blister has popped up again on her right lower extremity in the same place in the mid shin area in the anterior lateral compartment. 10/05/2015 -- last week she was admitted to the hospital for 4 days as she had a pneumonia and was treated with IV and oral antibiotics. 04/22/16: f/u for right upper back wound. no systemic s/s of infection. no new wounds or skin breakdown reported. Electronic Signature(s) Signed: 04/22/2016 4:21:25 PM By: Londell Moh FNP Entered By: Londell Moh on 04/22/2016 09:46:55 [name], [name] ([id number]) -------------------------------------------------------------------------------- Physical Exam Details Patient Name: [name], [name]. Date of Service: 04/22/2016 9:15 AM Medical Record Number: [id number] Patient Account Number: [id number] Date of Birth/Sex: [birth year] (60 y.o. Female) Treating RN: Cornell Barman Primary Care Physician: [name] Other Clinician: Referring Physician: [name] Treating Physician/Extender: Loistine Chance in Treatment: 58 Constitutional chronically ill appearing female in NAD.. Ears, Nose, Mouth, and Throat Patient can hear normal speaking tones without difficulty.Marland Kitchen Respiratory Respiratory effort is easy and symmetric bilaterally. Rate is normal at rest and on room air.Marland Kitchen Psychiatric Judgement and insight intact.. Alert  and oriented times 3.. No evidence of depression, anxiety, or agitation. Calm, cooperative, and communicative. Appropriate interactions and affect.. Electronic Signature(s) Signed: 04/22/2016 4:21:25 PM By: Londell Moh FNP Entered By: Londell Moh on 04/22/2016 09:48:27 [name], [name] ([id number]) -------------------------------------------------------------------------------- Physician Orders Details Patient Name: [name] Date of Service: 04/22/2016 9:15 AM Medical Record Patient Account Number: [id number] Number: [name], RN, [name], Treating RN: [birth year] (60 y.o. [name] Date of Birth/Sex: Female) Other Clinician: Primary Care Physician: [name] Treating Londell Moh Referring Physician: [name] Physician/Extender: Suella Grove in Treatment: 77 Verbal / Phone Orders: Yes Clinician: [name], RN, [name], [name] Read Back and Verified: Yes Diagnosis Coding Wound Cleansing Wound #1 Right Back o Clean wound with Normal Saline. o May Shower, gently pat wound dry prior to applying new dressing. - PATIENT MAY SHOWER AND Cross WOUND WITH SOAP AND WATER WITH CNA ASSISTANCE AND PAT WOUND DRY AND SNF NURSE TO APPLY NEW DRESSING RIGHT AFTERWARDS Skin Barriers/Peri-Wound Care Wound #1 Right Back o Skin Prep Primary Wound Dressing Wound #1 Right Back o Other: - Poly Mem - Please wet prior to removing; Cover with [facility] Dressing Dressing Change Frequency Wound #1 Right Back o Other: - Change when soiled Follow-up Appointments Wound #1 Right Back o Return Appointment in 1 week. Electronic Signature(s) Signed: 04/22/2016 3:48:46 PM By: Regan Lemming [name], RN Signed: 04/22/2016 4:21:25 PM By: Londell Moh FNP Entered By: Regan Lemming on 04/22/2016 09:41:54 Dain, [name] ([id number]) -------------------------------------------------------------------------------- Problem List Details Patient Name: PIYA, VANSANT. Date of Service: 04/22/2016 9:15  AM Medical Record Number: [id number] Patient Account Number: [id number] Date of Birth/Sex: [birth year] (60 y.o. Female) Treating RN: Cornell Barman Primary Care Physician: [name] Other Clinician: Referring Physician: [name] Treating Physician/Extender: Loistine Chance in Treatment: 69 Active Problems ICD-10 Encounter Code  Description Active Date Diagnosis E11.622 Type 2 diabetes mellitus with other skin ulcer 12/23/2014 Yes L89.112 Pressure ulcer of right upper back, stage 2 12/23/2014 Yes S80.821A Blister (nonthermal), right lower leg, initial encounter 12/23/2014 Yes N18.6 End stage renal disease 12/23/2014 Yes Inactive Problems Resolved Problems Electronic Signature(s) Signed: 04/22/2016 4:21:25 PM By: Londell Moh FNP Entered By: Londell Moh on 04/22/2016 09:45:23 [name], [name] ([id number]) -------------------------------------------------------------------------------- Progress Note Details [name], [name] Date of Service: 04/22/2016 9:15 AM Medical Record Number: [id number] Patient Account Number: [id number] Date of Birth/Sex: 1955-12-24 (60 y.o. Female) Treating RN: Cornell Barman Primary Care Physician: [name] Other Clinician: Referring Physician: [name] Treating Physician/Extender: Loistine Chance in Treatment: 69 Subjective Chief Complaint Information obtained from Patient Patient presents to the wound care center for a consult due non healing wound. She has an open wound on her right upper back which she's had for about a year and she recently noticed a blister on her right lower extremity about 2 weeks ago. History of Present Illness (HPI) The following HPI elements were documented for the patient's wound: Location: right upper back and right lower extremity wounds Quality: Patient reports No Pain. Severity: Patient states wound (s) are getting better. Duration: Patient has had the wound for > 3 months prior to  seeking treatment at the wound center Timing: she thought it first occurred when she was using a heating pad about a year ago after back surgery. Context: The wound appeared gradually over time Modifying Factors: Patient is currently on renal dialysis and receives treatments 3 times weekly Associated Signs and Symptoms: Patient reports having: surgery scheduled for this week for a AV fistula left arm. 60 year old patient who is known to be a diabetic and has end-stage renal disease has had several comorbidities including coronary artery disease, hypertension, hyperlipidemia, pancreatitis, anemia, previous history of hysterectomy, cholecystectomy, left-sided salivary gland excision, bilateral cataract surgery,Peritoneal dialysis catheter, hemodialysis catheter. the area on the back has also been caused by instant pressure she used to sleep on a recliner all day and has significant kyphoscoliosis. As far as the wound on her right lower extremity she's not sure how this blister occurred but she thought it has been there for about 2 weeks. No recent blood investigations available and no recent hemoglobin A1c. 12/30/2014 -- she is an assisted living facility but I believe the nurses that have not followed instructions as she had some cream applied on her back and there was a different dressing. Last week she's had a AV fistula placed on her left forearm. 01/13/2015 -- she has had some localized infection at the port site and she's been on doxycycline for this. 02/05/2015 - he has developed a small blister on her right lower extremity. 02/10/2015 -- she has developed another small blister on her right anterior chest wall in the area where she's had tape for her dialysis access. this may just be injury caused by a tape burn. 02/18/2015 -- no new blisters and she had a dermatology opinion and they have taken a biopsy of her skin. [name], [name] ([id number]) She also had a left brachial AV fistula  placed this week. 03/03/2015 -- though we do not have the pathology report yet the patient says she has been put on prednisone because this skin disease is possibly to do with her immune system and her dermatologist is recommended this. 03/10/2015 -- she has developed a new blister which is quite large on her right lower extremity on the shin.  05/22/2015 -- she did very well with her dressing but on trying to remove the [place] it peels away the newly healed skin. 06/05/2015 -- the patient was in the ER this past week for severe bleeding from the right nostril and had to have ENT see her and do a packing. They've also been holding her heparin during her dialysis. The patient is going back to ENT today. Other than that she has had no other significant issues. 06/19/2015 -- she is back on her blood thinners and continues to have her hemodialysis as before. 07/31/2015 -- a large blister has popped up again on her right lower extremity in the same place in the mid shin area in the anterior lateral compartment. 10/05/2015 -- last week she was admitted to the hospital for 4 days as she had a pneumonia and was treated with IV and oral antibiotics. 04/22/16: f/u for right upper back wound. no systemic s/s of infection. no new wounds or skin breakdown reported. Objective Constitutional chronically ill appearing female in NAD.Marland Kitchen Vitals Time Taken: 9:32 AM, Height: 65 in, Weight: 156 lbs, BMI: 26, Temperature: 98.4 F, Pulse: 80 bpm, Respiratory Rate: 16 breaths/min, Blood Pressure: 149/54 mmHg. Ears, Nose, Mouth, and Throat Patient can hear normal speaking tones without difficulty.Marland Kitchen Respiratory Respiratory effort is easy and symmetric bilaterally. Rate is normal at rest and on room air.Marland Kitchen Psychiatric Judgement and insight intact.. Alert and oriented times 3.. No evidence of depression, anxiety, or agitation. Calm, cooperative, and communicative. Appropriate interactions and  affect.. Integumentary (Hair, Skin) Wound #1 status is Open. Original cause of wound was Blister. The wound is located on the Right Back. The wound measures 0.3cm length x 0.3cm width x 0.1cm depth; 0.071cm^2 area and 0.007cm^3 volume. [place], [name] ([id number]) The wound is limited to skin breakdown. There is no tunneling or undermining noted. There is a small amount of sanguinous drainage noted. The wound margin is flat and intact. There is large (67-100%) red, friable granulation within the wound bed. There is no necrotic tissue within the wound bed. The periwound skin appearance exhibited: Scarring, Moist, Ecchymosis. The periwound skin appearance did not exhibit: Callus, Crepitus, Excoriation, Fluctuance, Friable, Induration, Localized Edema, Rash, Dry/Scaly, Maceration, Atrophie Blanche, Cyanosis, Hemosiderin Staining, Mottled, Pallor, Rubor, Erythema. Periwound temperature was noted as No Abnormality. Assessment Active Problems ICD-10 E11.622 - Type 2 diabetes mellitus with other skin ulcer L89.112 - Pressure ulcer of right upper back, stage 2 S80.821A - Blister (nonthermal), right lower leg, initial encounter N18.6 - End stage renal disease Diagnoses ICD-10 E11.622: Type 2 diabetes mellitus with other skin ulcer L89.112: Pressure ulcer of right upper back, stage 2 S80.821A: Blister (nonthermal), right lower leg, initial encounter N18.6: End stage renal disease Plan Wound Cleansing: Wound #1 Right Back: Clean wound with Normal Saline. May Shower, gently pat wound dry prior to applying new dressing. - PATIENT MAY SHOWER AND [place] SOAP AND WATER WITH CNA ASSISTANCE AND PAT WOUND DRY AND SNF NURSE TO APPLY NEW DRESSING RIGHT AFTERWARDS Skin Barriers/Peri-Wound Care: Wound #1 Right Back: Skin Prep Primary Wound Dressing: Wound #1 Right Back: Other: - Poly Mem - Please wet prior to removing; Cover with [facility] Dressing Dressing Change Frequency: Wound #1 Right  Back: Other: - Change when soiled [name], [name] ([id number]) Follow-up Appointments: Wound #1 Right Back: Return Appointment in 1 week. Follow-Up Appointments: A Patient Clinical Summary of Care was provided to ld 1. discussed clinical findings and implications with pt. all questions were answered.  Electronic Signature(s) Signed: 05/11/2016 3:41:54 PM By: Londell Moh FNP Previous Signature: 04/22/2016 4:21:25 PM Version By: Londell Moh FNP Entered By: Londell Moh on 05/11/2016 15:41:53 [name], [name] ([id number]) -------------------------------------------------------------------------------- SuperBill Details Patient Name: [name] Date of Service: 04/22/2016 Medical Record Number: [id number] Patient Account Number: [id number] Date of Birth/Sex: July 18, 1956 (60 y.o. Female) Treating RN: Cornell Barman Primary Care Physician: [name] Other Clinician: Referring Physician: [name] Treating Physician/Extender: Loistine Chance in Treatment: 69 Diagnosis Coding ICD-10 Codes Code Description E11.622 Type 2 diabetes mellitus with other skin ulcer L89.112 Pressure ulcer of right upper back, stage 2 S80.821A Blister (nonthermal), right lower leg, initial encounter N18.6 End stage renal disease Facility Procedures CPT4 Code: [id number] Description: [phone] - WOUND CARE VISIT-LEV 2 EST PT Modifier: Quantity: 1 Physician Procedures CPT4 Code: [id number] Description: [phone] - WC PHYS LEVEL 2 - EST PT ICD-10 Description Diagnosis L89.112 Pressure ulcer of right upper back, stage 2 E11.622 Type 2 diabetes mellitus with other skin ulcer Modifier: Quantity: 1 Electronic Signature(s) Signed: 04/22/2016 4:21:25 PM By: Londell Moh FNP Entered By: Londell Moh on 04/22/2016 09:49:49

## 2016-04-23 NOTE — Progress Notes (Signed)
NELWYN, AUSTELL ([id number]) Visit Report for 04/22/2016 Arrival Information Details [name], [name]. Date of Service: 04/22/2016 9:15 AM Medical Record Number: [id number] Patient Account Number: [id number] Date of Birth/Sex: [birth year] (60 y.o. Female) Treating RN: [name], RN, [name], [name] Primary Care Physician: [name] Other Clinician: Referring Physician: [name] Treating Physician/Extender: Loistine Chance in Treatment: 69 Visit Information History Since Last Visit All ordered tests and consults were completed: No Patient Arrived: [name] Added or deleted any medications: No Arrival Time: 09:31 Any new allergies or adverse reactions: No Accompanied By: self Had a fall or experienced change in No Transfer Assistance: None activities of daily living that may affect Patient Identification Verified: Yes risk of falls: Secondary Verification Process Yes Signs or symptoms of abuse/neglect since last No Completed: visito Patient Requires Transmission- No Hospitalized since last visit: No Based Precautions: Has Dressing in Place as Prescribed: Yes Patient Has Alerts: Yes Pain Present Now: No Patient Alerts: Patient on Blood Thinner Electronic Signature(s) Signed: 04/22/2016 3:48:46 PM By: Regan Lemming [name], RN Entered By: Regan Lemming on 04/22/2016 09:32:15 Malczewski, [name] ([id number]) -------------------------------------------------------------------------------- Clinic Level of Care Assessment Details [name]. Date of Service: 04/22/2016 9:15 AM Medical Record Number: [id number] Patient Account Number: [id number] Date of Birth/Sex: 03-11-1956 (60 y.o. Female) Treating RN: [name], RN, [name], [place] Primary Care Physician: [name] Other Clinician: Referring Physician: [name] Treating Physician/Extender: Loistine Chance in Treatment: 69 Clinic Level of Care Assessment Items TOOL 4 Quantity Score []  - Use when only  an EandM is performed on FOLLOW-UP visit 0 ASSESSMENTS - Nursing Assessment / Reassessment X - Reassessment of Co-morbidities (includes updates in patient status) 1 10 X - Reassessment of Adherence to Treatment Plan 1 5 ASSESSMENTS - Wound and Skin Assessment / Reassessment X - Simple Wound Assessment / Reassessment - one wound 1 5 []  - Complex Wound Assessment / Reassessment - multiple wounds 0 []  - Dermatologic / Skin Assessment (not related to wound area) 0 ASSESSMENTS - Focused Assessment []  - Circumferential Edema Measurements - multi extremities 0 []  - Nutritional Assessment / Counseling / Intervention 0 []  - Lower Extremity Assessment (monofilament, tuning fork, pulses) 0 []  - Peripheral Arterial Disease Assessment (using hand held doppler) 0 ASSESSMENTS - Ostomy and/or Continence Assessment and Care []  - Incontinence Assessment and Management 0 []  - Ostomy Care Assessment and Management (repouching, etc.) 0 PROCESS - Coordination of Care X - Simple Patient / Family Education for ongoing care 1 15 []  - Complex (extensive) Patient / Family Education for ongoing care 0 []  - Staff obtains Programmer, systems, Records, Test Results / Process Orders 0 []  - Staff telephones HHA, Nursing Homes / Clarify orders / etc 0 []  - Routine Transfer to another Facility (non-emergent condition) 0 Luckett, Charman S. ([id number]) []  - Routine Hospital Admission (non-emergent condition) 0 []  - New Admissions / Biomedical engineer / Ordering NPWT, Apligraf, etc. 0 []  - Emergency Hospital Admission (emergent condition) 0 []  - Simple Discharge Coordination 0 []  - Complex (extensive) Discharge Coordination 0 PROCESS - Special Needs []  - Pediatric / Minor Patient Management 0 []  - Isolation Patient Management 0 []  - Hearing / Language / Visual special needs 0 []  - Assessment of Community assistance (transportation, D/C planning, etc.) 0 []  - Additional assistance / Altered mentation 0 []  - Support Surface(s)  Assessment (bed, cushion, seat, etc.) 0 INTERVENTIONS - Wound Cleansing / Measurement X - Simple Wound Cleansing - one wound 1 5 []  - Complex Wound  Cleansing - multiple wounds 0 X - Wound Imaging (photographs - any number of wounds) 1 5 []  - Wound Tracing (instead of photographs) 0 X - Simple Wound Measurement - one wound 1 5 []  - Complex Wound Measurement - multiple wounds 0 INTERVENTIONS - Wound Dressings X - Small Wound Dressing one or multiple wounds 1 10 []  - Medium Wound Dressing one or multiple wounds 0 []  - Large Wound Dressing one or multiple wounds 0 []  - Application of Medications - topical 0 []  - Application of Medications - injection 0 INTERVENTIONS - Miscellaneous []  - External ear exam 0 [name], [name] ([id number]) []  - Specimen Collection (cultures, biopsies, blood, body fluids, etc.) 0 []  - Specimen(s) / Culture(s) sent or taken to Lab for analysis 0 []  - Patient Transfer (multiple staff / Harrel Lemon Lift / Similar devices) 0 []  - Simple Staple / Suture removal (25 or less) 0 []  - Complex Staple / Suture removal (26 or more) 0 []  - Hypo / Hyperglycemic Management (close monitor of Blood Glucose) 0 []  - Ankle / Brachial Index (ABI) - do not check if billed separately 0 X - Vital Signs 1 5 Has the patient been seen at the hospital within the last three years: Yes Total Score: 65 Level Of Care: New/Established - Level 2 Electronic Signature(s) Signed: 04/22/2016 3:48:46 PM By: Regan Lemming [name], RN Entered By: Regan Lemming on 04/22/2016 09:46:59 Ballew, [name] ([id number]) -------------------------------------------------------------------------------- Encounter Discharge Information Details [name], [name] Date of Service: 04/22/2016 9:15 AM Medical Record Number: [id number] Patient Account Number: [id number] Date of Birth/Sex: 01/04/1956 (60 y.o. Female) Treating RN: Cornell Barman Primary Care Physician: [name] Other Clinician: Referring Physician:  [name] Treating Physician/Extender: Loistine Chance in Treatment: 69 Encounter Discharge Information Items Schedule Follow-up Appointment: No Medication Reconciliation completed No and provided to Patient/Care Destynie Toomey: Provided on Clinical Summary of Care: 04/22/2016 Form Type Recipient Paper Patient ld Electronic Signature(s) Signed: 04/22/2016 9:54:04 AM By: Lorine Bears RCP, RRT, CHT Entered By: Lorine Bears on 04/22/2016 09:54:04 [name], [name] ([id number]) -------------------------------------------------------------------------------- Lower Extremity Assessment Details [name], [name] Date of Service: 04/22/2016 9:15 AM Medical Record Number: [id number] Patient Account Number: [id number] Date of Birth/Sex: October 09, 1955 (60 y.o. Female) Treating RN: [name], RN, [name], [name] Primary Care Physician: [name] Other Clinician: Referring Physician: [name] Treating Physician/Extender: Loistine Chance in Treatment: 67 Electronic Signature(s) Signed: 04/22/2016 3:48:46 PM By: Regan Lemming [name], RN Entered By: Regan Lemming on 04/22/2016 09:34:22 Schou, [name] ([id number]) -------------------------------------------------------------------------------- Multi Wound Chart Details [name], [name] Date of Service: 04/22/2016 9:15 AM Medical Record Number: [id number] Patient Account Number: [id number] Date of Birth/Sex: Nov 15, 1955 (60 y.o. Female) Treating RN: Baruch Gouty, RN, [name], [name] Primary Care Physician: [name] Other Clinician: Referring Physician: [name] Treating Physician/Extender: Loistine Chance in Treatment: 69 Vital Signs Height(in): 65 Pulse(bpm): 80 Weight(lbs): 156 Blood Pressure 149/54 (mmHg): Body Mass Index(BMI): 26 Temperature(F): 98.4 Respiratory Rate 16 (breaths/min): Photos: [1:No Photos] [N/A:N/A] Wound Location: [1:Right Back] [N/A:N/A] Wounding Event:  [1:Blister] [N/A:N/A] Primary Etiology: [1:Pressure Ulcer] [N/A:N/A] Comorbid History: [1:Glaucoma, Anemia, Coronary Artery Disease, Hypertension, Type II Diabetes, End Stage Renal Disease, Osteoarthritis, Neuropathy] [N/A:N/A] Date Acquired: [1:10/19/2013] [N/A:N/A] Weeks of Treatment: [1:69] [N/A:N/A] Wound Status: [1:Open] [N/A:N/A] Measurements L x W x D 0.3x0.3x0.1 [N/A:N/A] (cm) Area (cm) : [1:0.071] [N/A:N/A] Volume (cm) : [1:0.007] [N/A:N/A] % Reduction in Area: [1:99.80%] [N/A:N/A] % Reduction in Volume: 99.80% [N/A:N/A] Classification: [1:Category/Stage II] [N/A:N/A] Exudate Amount: [1:Small] [  N/A:N/A] Exudate Type: [1:Sanguinous] [N/A:N/A] Exudate Color: [1:red] [N/A:N/A] Wound Margin: [1:Flat and Intact] [N/A:N/A] Granulation Amount: [1:Large (67-100%)] [N/A:N/A] Granulation Quality: [1:Red, Friable] [N/A:N/A] Necrotic Amount: [1:None Present (0%)] [N/A:N/A] Exposed Structures: [1:Fascia: No Fat: No Tendon: No] [N/A:N/A] Muscle: No Joint: No Bone: No Limited to Skin Breakdown Epithelialization: Small (1-33%) N/A N/A Periwound Skin Texture: Scarring: Yes N/A N/A Edema: No Excoriation: No Induration: No Callus: No Crepitus: No Fluctuance: No Friable: No Rash: No Periwound Skin Moist: Yes N/A N/A Moisture: Maceration: No Dry/Scaly: No Periwound Skin Color: Ecchymosis: Yes N/A N/A Atrophie Blanche: No Cyanosis: No Erythema: No Hemosiderin Staining: No Mottled: No Pallor: No Rubor: No Temperature: No Abnormality N/A N/A Tenderness on No N/A N/A Palpation: Wound Preparation: Ulcer Cleansing: N/A N/A Rinsed/Irrigated with Saline Topical Anesthetic Applied: None Treatment Notes Electronic Signature(s) Signed: 04/22/2016 3:48:46 PM By: Regan Lemming [name], RN Entered By: Regan Lemming on 04/22/2016 09:36:44 [name], [name] ([id number]) -------------------------------------------------------------------------------- [place]  Details [name]. Date of Service: 04/22/2016 9:15 AM Medical Record Number: [id number] Patient Account Number: [id number] Date of Birth/Sex: 1955/09/29 (60 y.o. Female) Treating RN: [name], RN, [name], [name] Primary Care Physician: [name] Other Clinician: Referring Physician: [name] Treating Physician/Extender: Loistine Chance in Treatment: 69 Active Inactive Abuse / Safety / Falls / Self Care Management Nursing Diagnoses: Potential for falls Goals: Patient will remain injury free Date Initiated: 12/23/2014 Goal Status: Active Patient/caregiver will verbalize understanding of skin care regimen Date Initiated: 12/23/2014 Goal Status: Active Patient/caregiver will verbalize/demonstrate measures taken to prevent injury and/or falls Date Initiated: 12/23/2014 Goal Status: Active Patient/caregiver will verbalize/demonstrate understanding of what to do in case of emergency Date Initiated: 12/23/2014 Goal Status: Active Interventions: Assess fall risk on admission and as needed Assess: immobility, friction, shearing, incontinence upon admission and as needed Assess impairment of mobility on admission and as needed per policy Assess self care needs on admission and as needed Provide education on fall prevention Notes: Wound/Skin Impairment Nursing Diagnoses: Impaired tissue integrity Knowledge deficit related to ulceration/compromised skin integrity Goals: Patient/caregiver will verbalize understanding of skin care regimen ALDEA, KISHEL ([id number]) Date Initiated: 12/23/2014 Goal Status: Active Ulcer/skin breakdown will heal within 14 weeks Date Initiated: 12/23/2014 Goal Status: Active Interventions: Assess patient/caregiver ability to obtain necessary supplies Assess patient/caregiver ability to perform ulcer/skin care regimen upon admission and as needed Assess ulceration(s) every visit Provide education on ulcer and skin care Treatment  Activities: Skin care regimen initiated : 11/05/2015 Topical wound management initiated : 11/05/2015 Notes: Electronic Signature(s) Signed: 04/22/2016 3:48:46 PM By: Regan Lemming [name], RN Entered By: Regan Lemming on 04/22/2016 09:36:37 [name], [name] ([id number]) -------------------------------------------------------------------------------- Pain Assessment Details [name]. Date of Service: 04/22/2016 9:15 AM Medical Record Number: [id number] Patient Account Number: [id number] Date of Birth/Sex: 1955-10-19 (60 y.o. Female) Treating RN: Baruch Gouty, RN, [name], [name] Primary Care Physician: [name] Other Clinician: Referring Physician: [name] Treating Physician/Extender: Loistine Chance in Treatment: 69 Active Problems Location of Pain Severity and Description of Pain Patient Has Paino No Site Locations With Dressing Change: No Pain Management and Medication Current Pain Management: Electronic Signature(s) Signed: 04/22/2016 3:48:46 PM By: Regan Lemming [name], RN Entered By: Regan Lemming on 04/22/2016 09:32:41 [name], [name] ([id number]) -------------------------------------------------------------------------------- Wound Assessment Details [name], [name] Date of Service: 04/22/2016 9:15 AM Medical Record Number: [id number] Patient Account Number: [id number] Date of Birth/Sex: 1955-11-15 (60 y.o. Female) Treating RN: [name], RN, [name], [name] Primary Care Physician: [name] Other  Clinician: Referring Physician: [name] Treating Physician/Extender: Loistine Chance in Treatment: 69 Wound Status Wound Number: 1 Primary Pressure Ulcer Etiology: Wound Location: Right Back Wound Open Wounding Event: Blister Status: Date Acquired: 10/19/2013 Comorbid Glaucoma, Anemia, Coronary Artery Weeks Of Treatment: 69 History: Disease, Hypertension, Type II Clustered Wound: No Diabetes, End Stage Renal Disease, Osteoarthritis,  Neuropathy Photos Photo Uploaded By: Regan Lemming on 04/22/2016 13:24:14 Wound Measurements Length: (cm) 0.3 Width: (cm) 0.3 Depth: (cm) 0.1 Area: (cm) 0.071 Volume: (cm) 0.007 % Reduction in Area: 99.8% % Reduction in Volume: 99.8% Epithelialization: Small (1-33%) Tunneling: No Undermining: No Wound Description Classification: Category/Stage II Wound Margin: Flat and Intact Exudate Amount: Small Exudate Type: Sanguinous Exudate Color: red Foul Odor After Cleansing: No Wound Bed Granulation Amount: Large (67-100%) Exposed Structure Granulation Quality: Red, Friable Fascia Exposed: No Necrotic Amount: None Present (0%) Fat Layer Exposed: No [name], [name] ([id number]) Tendon Exposed: No Muscle Exposed: No Joint Exposed: No Bone Exposed: No Limited to Skin Breakdown Periwound Skin Texture Texture Color No Abnormalities Noted: No No Abnormalities Noted: No Callus: No Atrophie Blanche: No Crepitus: No Cyanosis: No Excoriation: No Ecchymosis: Yes Fluctuance: No Erythema: No Friable: No Hemosiderin Staining: No Induration: No Mottled: No Localized Edema: No Pallor: No Rash: No Rubor: No Scarring: Yes Temperature / Pain Moisture Temperature: No Abnormality No Abnormalities Noted: No Dry / Scaly: No Maceration: No Moist: Yes Wound Preparation Ulcer Cleansing: Rinsed/Irrigated with Saline Topical Anesthetic Applied: None Electronic Signature(s) Signed: 04/22/2016 3:48:46 PM By: Regan Lemming [name], RN Entered By: Regan Lemming on 04/22/2016 09:36:25 Remmers, [name] ([id number]) -------------------------------------------------------------------------------- Vitals Details Patient Name: Abigail Miyamoto, Sera S. Date of Service: 04/22/2016 9:15 AM Medical Record Number: [id number] Patient Account Number: [id number] Date of Birth/Sex: March 26, 1956 (60 y.o. Female) Treating RN: [name], RN, [name], [place]y Primary Care Physician: [name] Other Clinician: Referring Physician:  [name] Treating Physician/Extender: Loistine Chance in Treatment: 69 Vital Signs Time Taken: 09:32 Temperature (F): 98.4 Height (in): 65 Pulse (bpm): 80 Weight (lbs): 156 Respiratory Rate (breaths/min): 16 Body Mass Index (BMI): 26 Blood Pressure (mmHg): 149/54 Reference Range: 80 - 120 mg / dl Electronic Signature(s) Signed: 04/22/2016 3:48:46 PM By: Regan Lemming [name], RN Entered By: Regan Lemming on 04/22/2016 09:34:10

## 2016-04-29 ENCOUNTER — Encounter: Payer: Medicare Other | Admitting: Nurse Practitioner

## 2016-04-29 DIAGNOSIS — E11622 Type 2 diabetes mellitus with other skin ulcer: Secondary | ICD-10-CM | POA: Diagnosis not present

## 2016-04-30 NOTE — Progress Notes (Signed)
[name], RICHERSON ([id number]) Visit Report for 04/29/2016 Arrival Information Details [name], [name]. Date of Service: 04/29/2016 9:15 AM Medical Record Number: [id number] Patient Account Number: [id number] Date of Birth/Sex: 07-Aug-1955 (60 y.o. Female) Treating RN: Montey Hora Primary Care Physician: [name] Other Clinician: Referring Physician: [name] Treating Physician/Extender: Loistine Chance in Treatment: 70 Visit Information History Since Last Visit Added or deleted any medications: No Patient Arrived: Walker Any new allergies or adverse reactions: No Arrival Time: 10:00 Had a fall or experienced change in No Accompanied By: self activities of daily living that may affect Transfer Assistance: None risk of falls: Patient Identification Verified: Yes Signs or symptoms of abuse/neglect since last No Secondary Verification Process Yes visito Completed: Hospitalized since last visit: No Patient Requires Transmission- No Pain Present Now: No Based Precautions: Patient Has Alerts: Yes Patient Alerts: Patient on Blood Thinner Notes Patient arrived at 954 Electronic Signature(s) Signed: 04/29/2016 5:50:35 PM By: Montey Hora Entered By: Montey Hora on 04/29/2016 10:01:21 [name], [name] ([id number]) -------------------------------------------------------------------------------- Clinic Level of Care Assessment Details [name]. Date of Service: 04/29/2016 9:15 AM Medical Record Number: [id number] Patient Account Number: [id number] Date of Birth/Sex: 1956/02/14 (60 y.o. Female) Treating RN: Montey Hora Primary Care Physician: [name] Other Clinician: Referring Physician: [name] Treating Physician/Extender: Loistine Chance in Treatment: 70 Clinic Level of Care Assessment Items TOOL 4 Quantity Score []  - Use when only an EandM is performed on FOLLOW-UP visit 0 ASSESSMENTS - Nursing  Assessment / Reassessment X - Reassessment of Co-morbidities (includes updates in patient status) 1 10 X - Reassessment of Adherence to Treatment Plan 1 5 ASSESSMENTS - Wound and Skin Assessment / Reassessment X - Simple Wound Assessment / Reassessment - one wound 1 5 []  - Complex Wound Assessment / Reassessment - multiple wounds 0 []  - Dermatologic / Skin Assessment (not related to wound area) 0 ASSESSMENTS - Focused Assessment []  - Circumferential Edema Measurements - multi extremities 0 []  - Nutritional Assessment / Counseling / Intervention 0 []  - Lower Extremity Assessment (monofilament, tuning fork, pulses) 0 []  - Peripheral Arterial Disease Assessment (using hand held doppler) 0 ASSESSMENTS - Ostomy and/or Continence Assessment and Care []  - Incontinence Assessment and Management 0 []  - Ostomy Care Assessment and Management (repouching, etc.) 0 PROCESS - Coordination of Care X - Simple Patient / Family Education for ongoing care 1 15 []  - Complex (extensive) Patient / Family Education for ongoing care 0 []  - Staff obtains Programmer, systems, Records, Test Results / Process Orders 0 []  - Staff telephones HHA, Nursing Homes / Clarify orders / etc 0 []  - Routine Transfer to another Facility (non-emergent condition) 0 [name], [name] ([id number]) []  - Routine Hospital Admission (non-emergent condition) 0 []  - New Admissions / Biomedical engineer / Ordering NPWT, Apligraf, etc. 0 []  - Emergency Hospital Admission (emergent condition) 0 X - Simple Discharge Coordination 1 10 []  - Complex (extensive) Discharge Coordination 0 PROCESS - Special Needs []  - Pediatric / Minor Patient Management 0 []  - Isolation Patient Management 0 []  - Hearing / Language / Visual special needs 0 []  - Assessment of Community assistance (transportation, D/C planning, etc.) 0 []  - Additional assistance / Altered mentation 0 []  - Support Surface(s) Assessment (bed, cushion, seat, etc.) 0 INTERVENTIONS - Wound  Cleansing / Measurement X - Simple Wound Cleansing - one wound 1 5 []  - Complex Wound Cleansing - multiple wounds 0 X - Wound Imaging (photographs - any number of wounds)  1 5 []  - Wound Tracing (instead of photographs) 0 X - Simple Wound Measurement - one wound 1 5 []  - Complex Wound Measurement - multiple wounds 0 INTERVENTIONS - Wound Dressings X - Small Wound Dressing one or multiple wounds 1 10 []  - Medium Wound Dressing one or multiple wounds 0 []  - Large Wound Dressing one or multiple wounds 0 []  - Application of Medications - topical 0 []  - Application of Medications - injection 0 INTERVENTIONS - Miscellaneous []  - External ear exam 0 [name], [name] ([id number]) []  - Specimen Collection (cultures, biopsies, blood, body fluids, etc.) 0 []  - Specimen(s) / Culture(s) sent or taken to Lab for analysis 0 []  - Patient Transfer (multiple staff / Harrel Lemon Lift / Similar devices) 0 []  - Simple Staple / Suture removal (25 or less) 0 []  - Complex Staple / Suture removal (26 or more) 0 []  - Hypo / Hyperglycemic Management (close monitor of Blood Glucose) 0 []  - Ankle / Brachial Index (ABI) - do not check if billed separately 0 X - Vital Signs 1 5 Has the patient been seen at the hospital within the last three years: Yes Total Score: 75 Level Of Care: New/Established - Level 2 Electronic Signature(s) Signed: 04/29/2016 5:50:35 PM By: Montey Hora Entered By: Montey Hora on 04/29/2016 11:36:18 [name], [name] ([id number]) -------------------------------------------------------------------------------- Encounter Discharge Information Details [name]. Date of Service: 04/29/2016 9:15 AM Medical Record Number: [id number] Patient Account Number: [id number] Date of Birth/Sex: [birth year] (60 y.o. Female) Treating RN: Montey Hora Primary Care Physician: [name] Other Clinician: Referring Physician: [name] Treating Physician/Extender: Loistine Chance in Treatment: 76 Encounter Discharge Information Items Discharge Pain Level: 0 Discharge Condition: Stable Ambulatory Status: Walker Discharge Destination: Nursing Home Transportation: Private Auto Accompanied By: self Schedule Follow-up Appointment: Yes Medication Reconciliation completed and provided to Patient/Care No [name]: Provided on Clinical Summary of Care: 04/29/2016 Form Type Recipient Paper Patient LD Electronic Signature(s) Signed: 04/29/2016 11:37:39 AM By: Montey Hora Previous Signature: 04/29/2016 10:27:53 AM Version By: [name] Entered By: Montey Hora on 04/29/2016 11:37:38 [name], [name] ([id number]) -------------------------------------------------------------------------------- Multi Wound Chart Details [name]. Date of Service: 04/29/2016 9:15 AM Medical Record Number: [id number] Patient Account Number: [id number] Date of Birth/Sex: 11/27/1955 (60 y.o. Female) Treating RN: Montey Hora Primary Care Physician: [name] Other Clinician: Referring Physician: [name] Treating Physician/Extender: Loistine Chance in Treatment: 70 Vital Signs Height(in): 65 Pulse(bpm): 81 Weight(lbs): 156 Blood Pressure 159/56 (mmHg): Body Mass Index(BMI): 26 Temperature(F): 98.2 Respiratory Rate 16 (breaths/min): Photos: [1:No Photos] [N/A:N/A] Wound Location: [1:Right Back] [N/A:N/A] Wounding Event: [1:Blister] [N/A:N/A] Primary Etiology: [1:Pressure Ulcer] [N/A:N/A] Date Acquired: [1:10/19/2013] [N/A:N/A] Weeks of Treatment: [1:70] [N/A:N/A] Wound Status: [1:Open] [N/A:N/A] Measurements L x W x D 0.9x0.9x0.1 [N/A:N/A] (cm) Area (cm) : [1:0.636] [N/A:N/A] Volume (cm) : [1:0.064] [N/A:N/A] % Reduction in Area: [1:98.30%] [N/A:N/A] % Reduction in Volume: 98.30% [N/A:N/A] Classification: [1:Category/Stage II] [N/A:N/A] Periwound Skin Texture: No Abnormalities Noted [N/A:N/A] Periwound Skin  [1:No Abnormalities Noted] [N/A:N/A] Moisture: Periwound Skin Color: No Abnormalities Noted [N/A:N/A] Tenderness on [1:No] [N/A:N/A] Treatment Notes Electronic Signature(s) Signed: 04/29/2016 5:50:35 PM By: Montey Hora Entered By: Montey Hora on 04/29/2016 10:20:35 [name], [name] ([id number]) -------------------------------------------------------------------------------- [place] Details [name]. Date of Service: 04/29/2016 9:15 AM Medical Record Number: [id number] Patient Account Number: [id number] Date of Birth/Sex: Nov 18, 1955 (60 y.o. Female) Treating RN: Montey Hora Primary Care Physician: [name] Other Clinician: Referring Physician: [name] Treating  Physician/Extender: Loistine Chance in Treatment: 69 Active Inactive Abuse / Safety / Falls / Self Care Management Nursing Diagnoses: Potential for falls Goals: Patient will remain injury free Date Initiated: 12/23/2014 Goal Status: Active Patient/caregiver will verbalize understanding of skin care regimen Date Initiated: 12/23/2014 Goal Status: Active Patient/caregiver will verbalize/demonstrate measures taken to prevent injury and/or falls Date Initiated: 12/23/2014 Goal Status: Active Patient/caregiver will verbalize/demonstrate understanding of what to do in case of emergency Date Initiated: 12/23/2014 Goal Status: Active Interventions: Assess fall risk on admission and as needed Assess: immobility, friction, shearing, incontinence upon admission and as needed Assess impairment of mobility on admission and as needed per policy Assess self care needs on admission and as needed Provide education on fall prevention Notes: Wound/Skin Impairment Nursing Diagnoses: Impaired tissue integrity Knowledge deficit related to ulceration/compromised skin integrity Goals: Patient/caregiver will verbalize understanding of skin care regimen [name], [name]  ([id number]) Date Initiated: 12/23/2014 Goal Status: Active Ulcer/skin breakdown will heal within 14 weeks Date Initiated: 12/23/2014 Goal Status: Active Interventions: Assess patient/caregiver ability to obtain necessary supplies Assess patient/caregiver ability to perform ulcer/skin care regimen upon admission and as needed Assess ulceration(s) every visit Provide education on ulcer and skin care Treatment Activities: Skin care regimen initiated : 11/05/2015 Topical wound management initiated : 11/05/2015 Notes: Electronic Signature(s) Signed: 04/29/2016 5:50:35 PM By: Montey Hora Entered By: Montey Hora on 04/29/2016 10:20:13 [name], [name] ([id number]) -------------------------------------------------------------------------------- Pain Assessment Details [name]. Date of Service: 04/29/2016 9:15 AM Medical Record Number: [id number] Patient Account Number: [id number] Date of Birth/Sex: 06/11/1956 (60 y.o. Female) Treating RN: Montey Hora Primary Care Physician: [name] Other Clinician: Referring Physician: [name] Treating Physician/Extender: Loistine Chance in Treatment: 70 Active Problems Location of Pain Severity and Description of Pain Patient Has Paino No Site Locations Pain Management and Medication Current Pain Management: Notes Topical or injectable lidocaine is offered to patient for acute pain when surgical debridement is performed. If needed, Patient is instructed to use over the counter pain medication for the following 24-48 hours after debridement. Wound care MDs do not prescribed pain medications. Patient has chronic pain or uncontrolled pain. Patient has been instructed to make an appointment with their Primary Care Physician for pain management. Electronic Signature(s) Signed: 04/29/2016 5:50:35 PM By: Montey Hora Entered By: Montey Hora on 04/29/2016 10:01:35 [name], [name]  ([id number]) -------------------------------------------------------------------------------- Patient/Caregiver Education Details [name]. Date of Service: 04/29/2016 9:15 AM Medical Record Number: [id number] Patient Account Number: [id number] Date of Birth/Gender: 1956-07-03 (60 y.o. Female) Treating RN: Montey Hora Primary Care Physician: [name] Other Clinician: Referring Physician: [name] Treating Physician/Extender: Loistine Chance in Treatment: 36 Education Assessment Education Provided To: Patient Education Topics Provided Wound/Skin Impairment: Handouts: Other: wound care as ordered Methods: Explain/Verbal Responses: State content correctly Electronic Signature(s) Signed: 04/29/2016 5:50:35 PM By: Montey Hora Entered By: Montey Hora on 04/29/2016 11:37:55 [place], [name] ([id number]) -------------------------------------------------------------------------------- Wound Assessment Details Patient Name: [place], Allyne S. Date of Service: 04/29/2016 9:15 AM Medical Record Number: [id number] Patient Account Number: [id number] Date of Birth/Sex: March 27, 1956 (60 y.o. Female) Treating RN: Montey Hora Primary Care Physician: [name] Other Clinician: Referring Physician: [name] Treating Physician/Extender: Loistine Chance in Treatment: 66 Wound Status Wound Number: 1 Primary Pressure Ulcer Etiology: Wound Location: Right Back Wound Open Wounding Event: Blister Status: Date Acquired: 10/19/2013 Comorbid Glaucoma, Anemia, Coronary Artery Weeks Of Treatment: 70 History: Disease, Hypertension, Type II Clustered Wound: No Diabetes, End Stage Renal Disease, Osteoarthritis,  Neuropathy Photos Wound Measurements Length: (cm) 0.9 Width: (cm) 0.9 Depth: (cm) 0.1 Area: (cm) 0.636 Volume: (cm) 0.064 % Reduction in Area: 98.3% % Reduction in Volume: 98.3% Epithelialization: Small (1-33%) Wound  Description Classification: Category/Stage II Wound Margin: Flat and Intact Exudate Amount: Small Exudate Type: Sanguinous Exudate Color: red Foul Odor After Cleansing: No Wound Bed Granulation Amount: Large (67-100%) Exposed Structure Granulation Quality: Red, Friable Fascia Exposed: No Necrotic Amount: None Present (0%) Fat Layer Exposed: No Tendon Exposed: No Ben, Taylyn S. ([id number]) Muscle Exposed: No Joint Exposed: No Bone Exposed: No Limited to Skin Breakdown Periwound Skin Texture Texture Color No Abnormalities Noted: No No Abnormalities Noted: No Callus: No Atrophie Blanche: No Crepitus: No Cyanosis: No Excoriation: No Ecchymosis: Yes Fluctuance: No Erythema: No Friable: No Hemosiderin Staining: No Induration: No Mottled: No Localized Edema: No Pallor: No Rash: No Rubor: No Scarring: Yes Temperature / Pain Moisture Temperature: No Abnormality No Abnormalities Noted: No Dry / Scaly: No Maceration: No Moist: Yes Wound Preparation Ulcer Cleansing: Rinsed/Irrigated with Saline Topical Anesthetic Applied: None Treatment Notes Wound #1 (Right Back) 1. Cleansed with: Clean wound with Normal Saline 4. Dressing Applied: Other dressing (specify in notes) 5. Secondary [place] Notes tritec silver foam Electronic Signature(s) Signed: 04/29/2016 5:50:35 PM By: Montey Hora Entered By: Montey Hora on 04/29/2016 10:21:07 Bynum, [name] ([id number]) -------------------------------------------------------------------------------- Vitals Details [name]. Date of Service: 04/29/2016 9:15 AM Medical Record Number: [id number] Patient Account Number: [id number] Date of Birth/Sex: 12-14-1955 (60 y.o. Female) Treating RN: Montey Hora Primary Care Physician: [name] Other Clinician: Referring Physician: [name] Treating Physician/Extender: Loistine Chance in Treatment: 70 Vital Signs Time  Taken: 10:01 Temperature (F): 98.2 Height (in): 65 Pulse (bpm): 81 Weight (lbs): 156 Respiratory Rate (breaths/min): 16 Body Mass Index (BMI): 26 Blood Pressure (mmHg): 159/56 Reference Range: 80 - 120 mg / dl Electronic Signature(s) Signed: 04/29/2016 5:50:35 PM By: Montey Hora Entered By: Montey Hora on 04/29/2016 10:02:02

## 2016-04-30 NOTE — Progress Notes (Signed)
[name], [name] ([id number]) Visit Report for 04/29/2016 Chief Complaint Document Details [name], [name] 04/29/2016 9:15 Date of Service: AM Medical Record [id number] Number: Patient Account Number: [id number] 1955-11-11 (60 y.o. Treating RN: [name] Date of Birth/Sex: Female) Other Clinician: Primary Care Physician: [name] Treating [name] Referring Physician: Nicky [name] Physician/Extender: [name] in Treatment: 33 Information Obtained from: Patient Chief Complaint Patient presents to the wound care center for a consult due non healing wound. She has an open wound on her right upper back which she's had for about a year and she recently noticed a blister on her right lower extremity about 2 weeks ago. Electronic Signature(s) Signed: 04/29/2016 5:31:30 PM By: [name] Moh FNP Entered By: [name] on 04/29/2016 11:05:23 Legere, [name] ([id number]) -------------------------------------------------------------------------------- HPI Details [name], [name] 04/29/2016 9:15 Date of Service: AM Medical Record [id number] Number: Patient Account Number: [id number] 25-Apr-1956 (60 y.o. Treating RN: [name] Date of Birth/Sex: Female) Other Clinician: Primary Care Physician: [name] Treating [name] Referring Physician: Nicky [name] Physician/Extender: [name] in Treatment: 62 History of Present Illness Location: right upper back and right lower extremity wounds Quality: Patient reports No Pain. Severity: Patient states wound (s) are getting better. Duration: Patient has had the wound for > 3 months prior to seeking treatment at the wound center Timing: she thought it first occurred when she was using a heating pad about a year ago after back surgery. Context: The wound appeared gradually over time Modifying Factors: Patient is currently on renal dialysis and receives treatments 3 times  weekly Associated Signs and Symptoms: Patient reports having: surgery scheduled for this week for a AV fistula left arm. HPI Description: 60 year old patient who is known to be a diabetic and has end-stage renal disease has had several comorbidities including coronary artery disease, hypertension, hyperlipidemia, pancreatitis, anemia, previous history of hysterectomy, cholecystectomy, left-sided salivary gland excision, bilateral cataract surgery,Peritoneal dialysis catheter, hemodialysis catheter. the area on the back has also been caused by instant pressure she used to sleep on a recliner all day and has significant kyphoscoliosis. As far as the wound on her right lower extremity she's not sure how this blister occurred but she thought it has been there for about 2 weeks. No recent blood investigations available and no recent hemoglobin A1c. 12/30/2014 -- she is an assisted living facility but I believe the nurses that have not followed instructions as she had some cream applied on her back and there was a different dressing. Last week she's had a AV fistula placed on her left forearm. 01/13/2015 -- she has had some localized infection at the port site and she's been on doxycycline for this. 02/05/2015 - he has developed a small blister on her right lower extremity. 02/10/2015 -- she has developed another small blister on her right anterior chest wall in the area where she's had tape for her dialysis access. this may just be injury caused by a tape burn. 02/18/2015 -- no new blisters and she had a dermatology opinion and they have taken a biopsy of her skin. She also had a left brachial AV fistula placed this week. 03/03/2015 -- though we do not have the pathology report yet the patient says she has been put on prednisone because this skin disease is possibly to do with her immune system and her dermatologist is recommended this. 03/10/2015 -- she has developed a new blister which is quite  large on her right lower extremity on the shin. 05/22/2015 --  she did very well with her dressing but on trying to remove the [place] it peels away [name], [name] ([id number]) the newly healed skin. 06/05/2015 -- the patient was in the ER this past week for severe bleeding from the right nostril and had to have ENT see her and do a packing. They've also been holding her heparin during her dialysis. The patient is going back to ENT today. Other than that she has had no other significant issues. 06/19/2015 -- she is back on her blood thinners and continues to have her hemodialysis as before. 07/31/2015 -- a large blister has popped up again on her right lower extremity in the same place in the mid shin area in the anterior lateral compartment. 10/05/2015 -- last week she was admitted to the hospital for 4 days as she had a pneumonia and was treated with IV and oral antibiotics. 04/22/16: f/u for right upper back wound. no systemic s/s of infection. no new wounds or skin breakdown reported. 04/29/16: returns today for f/u. no new wounds or skin breakdown. no systemic s/s of infection. no interval changes regarding health status. Electronic Signature(s) Signed: 04/29/2016 5:31:30 PM By: [name] Moh FNP Entered By: [name] on 04/29/2016 11:06:22 [name], [name] ([id number]) -------------------------------------------------------------------------------- Physical Exam Details [name], [name] 04/29/2016 9:15 Date of Service: AM Medical Record [id number] Number: Patient Account Number: [id number] 05-22-1956 (60 y.o. Treating RN: [name] Date of Birth/Sex: Female) Other Clinician: Primary Care Physician: [name] Treating [name] Referring Physician: Nicky [name] Physician/Extender: [name] in Treatment: 62 Constitutional chronically ill appearing elderly female in NAD. Eyes Conjunctivae clear. No discharge.. Ears, Nose, Mouth, and  Throat Patient can hear normal speaking tones without difficulty.Marland Kitchen Respiratory Respiratory effort is easy and symmetric bilaterally. Rate is normal at rest and on room air.Marland Kitchen Psychiatric Judgement and insight intact.. Alert and oriented times 3.. Short and long term memory intact.. No evidence of depression, anxiety, or agitation. Calm, cooperative, and communicative. Appropriate interactions and affect.. Electronic Signature(s) Signed: 04/29/2016 5:31:30 PM By: [name] Moh FNP Entered By: [name] on 04/29/2016 11:06:53 Lasser, [name] ([id number]) -------------------------------------------------------------------------------- Physician Orders Details [name], [name] 04/29/2016 9:15 Date of Service: AM Medical Record [id number] Number: Patient Account Number: [id number] Oct 26, 1955 (60 y.o. Treating RN: [name] Date of Birth/Sex: Female) Other Clinician: Primary Care Physician: [name] Treating [name] Referring Physician: Nicky [name] Physician/Extender: [name] in Treatment: 25 Verbal / Phone Orders: Yes Clinician: Montey [name] Read Back and Verified: Yes Diagnosis Coding Wound Cleansing Wound #1 Right Back o Clean wound with Normal Saline. o May Shower, gently pat wound dry prior to applying new dressing. - PATIENT MAY SHOWER AND [place] WOUND WITH SOAP AND WATER WITH CNA ASSISTANCE AND PAT WOUND DRY AND SNF NURSE TO APPLY NEW DRESSING RIGHT AFTERWARDS Skin Barriers/Peri-Wound Care Wound #1 Right Back o Skin Prep Primary Wound Dressing Wound #1 Right Back o Other: - Tritec Silver Ultra Foam - wet prior to removing; Cover with [facility] Dressing Dressing Change Frequency Wound #1 Right Back o Other: - Change when soiled Follow-up Appointments Wound #1 Right Back o Return Appointment in 1 week. Electronic Signature(s) Signed: 04/29/2016 5:31:30 PM By: [name] Moh FNP Signed: 04/29/2016 5:50:35 PM By:  [name] Entered By: [name] on 04/29/2016 10:22:05 Parrott, [name] ([id number]) -------------------------------------------------------------------------------- Problem List Details Patient Name: ZAIDA, WILDBERGER 04/29/2016 9:15 Date of Service: AM Medical Record [id number] Number: Patient Account Number: [id number] 03/28/1956 (60 y.o. Treating RN: Marjory Lies,  Di Kindle Date of Birth/Sex: Female) Other Clinician: Primary Care Physician: [name] Treating [name] Referring Physician: Nicky [name] Physician/Extender: [name] in Treatment: 28 Active Problems ICD-10 Encounter Code Description Active Date Diagnosis E11.622 Type 2 diabetes mellitus with other skin ulcer 12/23/2014 Yes L89.112 Pressure ulcer of right upper back, stage 2 12/23/2014 Yes S80.821A Blister (nonthermal), right lower leg, initial encounter 12/23/2014 Yes N18.6 End stage renal disease 12/23/2014 Yes Inactive Problems Resolved Problems Electronic Signature(s) Signed: 04/29/2016 5:31:30 PM By: [name] Moh FNP Entered By: [name] on 04/29/2016 11:05:11 Runco, [name] ([id number]) -------------------------------------------------------------------------------- Progress Note Details Patient Name: [name] Cornwall. 04/29/2016 9:15 Date of Service: AM Medical Record [id number] Number: Patient Account Number: [id number] 05-28-1956 (60 y.o. Treating RN: [name] Date of Birth/Sex: Female) Other Clinician: Primary Care Physician: [name] Treating [name] Referring Physician: Nicky [name] Physician/Extender: [name] in Treatment: 94 Subjective Chief Complaint Information obtained from Patient Patient presents to the wound care center for a consult due non healing wound. She has an open wound on her right upper back which she's had for about a year and she recently noticed a blister on her right lower extremity about 2 weeks ago. History of Present Illness (HPI) The  following HPI elements were documented for the patient's wound: Location: right upper back and right lower extremity wounds Quality: Patient reports No Pain. Severity: Patient states wound (s) are getting better. Duration: Patient has had the wound for > 3 months prior to seeking treatment at the wound center Timing: she thought it first occurred when she was using a heating pad about a year ago after back surgery. Context: The wound appeared gradually over time Modifying Factors: Patient is currently on renal dialysis and receives treatments 3 times weekly Associated Signs and Symptoms: Patient reports having: surgery scheduled for this week for a AV fistula left arm. 60 year old patient who is known to be a diabetic and has end-stage renal disease has had several comorbidities including coronary artery disease, hypertension, hyperlipidemia, pancreatitis, anemia, previous history of hysterectomy, cholecystectomy, left-sided salivary gland excision, bilateral cataract surgery,Peritoneal dialysis catheter, hemodialysis catheter. the area on the back has also been caused by instant pressure she used to sleep on a recliner all day and has significant kyphoscoliosis. As far as the wound on her right lower extremity she's not sure how this blister occurred but she thought it has been there for about 2 weeks. No recent blood investigations available and no recent hemoglobin A1c. 12/30/2014 -- she is an assisted living facility but I believe the nurses that have not followed instructions as she had some cream applied on her back and there was a different dressing. Last week she's had a AV fistula placed on her left forearm. 01/13/2015 -- she has had some localized infection at the port site and she's been on doxycycline for this. 02/05/2015 - he has developed a small blister on her right lower extremity. 02/10/2015 -- she has developed another small blister on her right anterior chest wall in the  area where she's had tape for her dialysis access. this may just be injury caused by a tape burn. [name], [name] ([id number]) 02/18/2015 -- no new blisters and she had a dermatology opinion and they have taken a biopsy of her skin. She also had a left brachial AV fistula placed this week. 03/03/2015 -- though we do not have the pathology report yet the patient says she has been put on prednisone because this skin disease is possibly to do with  her immune system and her dermatologist is recommended this. 03/10/2015 -- she has developed a new blister which is quite large on her right lower extremity on the shin. 05/22/2015 -- she did very well with her dressing but on trying to remove the [place] it peels away the newly healed skin. 06/05/2015 -- the patient was in the ER this past week for severe bleeding from the right nostril and had to have ENT see her and do a packing. They've also been holding her heparin during her dialysis. The patient is going back to ENT today. Other than that she has had no other significant issues. 06/19/2015 -- she is back on her blood thinners and continues to have her hemodialysis as before. 07/31/2015 -- a large blister has popped up again on her right lower extremity in the same place in the mid shin area in the anterior lateral compartment. 10/05/2015 -- last week she was admitted to the hospital for 4 days as she had a pneumonia and was treated with IV and oral antibiotics. 04/22/16: f/u for right upper back wound. no systemic s/s of infection. no new wounds or skin breakdown reported. 04/29/16: returns today for f/u. no new wounds or skin breakdown. no systemic s/s of infection. no interval changes regarding health status. Objective Constitutional chronically ill appearing elderly female in NAD. Vitals Time Taken: 10:01 AM, Height: 65 in, Weight: 156 lbs, BMI: 26, Temperature: 98.2 F, Pulse: 81 bpm, Respiratory Rate: 16 breaths/min, Blood  Pressure: 159/56 mmHg. Eyes Conjunctivae clear. No discharge.. Ears, Nose, Mouth, and Throat Patient can hear normal speaking tones without difficulty.Marland Kitchen Respiratory Respiratory effort is easy and symmetric bilaterally. Rate is normal at rest and on room air.Marland Kitchen Psychiatric Judgement and insight intact.. Alert and oriented times 3.. Short and long term memory intact.. No evidence Schiele, [name] ([id number]) of depression, anxiety, or agitation. Calm, cooperative, and communicative. Appropriate interactions and affect.. Integumentary (Hair, Skin) Wound #1 status is Open. Original cause of wound was Blister. The wound is located on the Right Back. The wound measures 0.9cm length x 0.9cm width x 0.1cm depth; 0.636cm^2 area and 0.064cm^3 volume. The wound is limited to skin breakdown. There is a small amount of sanguinous drainage noted. The wound margin is flat and intact. There is large (67-100%) red, friable granulation within the wound bed. There is no necrotic tissue within the wound bed. The periwound skin appearance exhibited: Scarring, Moist, Ecchymosis. The periwound skin appearance did not exhibit: Callus, Crepitus, Excoriation, Fluctuance, Friable, Induration, Localized Edema, Rash, Dry/Scaly, Maceration, Atrophie Blanche, Cyanosis, Hemosiderin Staining, Mottled, Pallor, Rubor, Erythema. Periwound temperature was noted as No Abnormality. Assessment Active Problems ICD-10 E11.622 - Type 2 diabetes mellitus with other skin ulcer L89.112 - Pressure ulcer of right upper back, stage 2 S80.821A - Blister (nonthermal), right lower leg, initial encounter N18.6 - End stage renal disease Plan Wound Cleansing: Wound #1 Right Back: Clean wound with Normal Saline. May Shower, gently pat wound dry prior to applying new dressing. - PATIENT MAY SHOWER AND [place] WITH SOAP AND WATER WITH CNA ASSISTANCE AND PAT WOUND DRY AND SNF NURSE TO APPLY NEW DRESSING RIGHT AFTERWARDS Skin  Barriers/Peri-Wound Care: Wound #1 Right Back: Skin Prep Primary Wound Dressing: Wound #1 Right Back: Other: - Tritec Silver Ultra Foam - wet prior to removing; Cover with [facility] Dressing Dressing Change Frequency: Wound #1 Right Back: [name], [name] ([id number]) Other: - Change when soiled Follow-up Appointments: Wound #1 Right Back: Return Appointment in 1 week.  Follow-Up Appointments: A Patient Clinical Summary of Care was provided to LD 1. discussed clinical findings and implications with pt. all questions were answered. Electronic Signature(s) Signed: 04/29/2016 5:31:30 PM By: [name] Moh FNP Entered By: [name] on 04/29/2016 11:07:29 [place], [name] ([id number]) -------------------------------------------------------------------------------- SuperBill Details Patient Name: [name] Cornwall. Date of Service: 04/29/2016 Medical Record Number: [id number] Patient Account Number: [id number] Date of Birth/Sex: 1956-06-08 (60 y.o. Female) Treating RN: [name] Primary Care Physician: [name] Other Clinician: Referring Physician: Nicky [name] Treating Physician/Extender: Loistine Chance in Treatment: 70 Diagnosis Coding ICD-10 Codes Code Description E11.622 Type 2 diabetes mellitus with other skin ulcer L89.112 Pressure ulcer of right upper back, stage 2 S80.821A Blister (nonthermal), right lower leg, initial encounter N18.6 End stage renal disease Facility Procedures CPT4 Code: [id number] Description: [phone] - WOUND CARE VISIT-LEV 2 EST PT Modifier: Quantity: 1 Physician Procedures CPT4 Code: [id number] Description: [id number] - WC PHYS LEVEL 2 - EST PT ICD-10 Description Diagnosis E11.622 Type 2 diabetes mellitus with other skin ulcer L89.112 Pressure ulcer of right upper back, stage 2 Modifier: Quantity: 1 Electronic Signature(s) Signed: 04/29/2016 11:36:38 AM By: [name] Signed: 04/29/2016 5:31:30 PM By: [name] Moh FNP Entered  By: [name] on 04/29/2016 11:36:37

## 2016-05-13 ENCOUNTER — Encounter: Payer: Medicare Other | Admitting: Surgery

## 2016-05-13 DIAGNOSIS — E11622 Type 2 diabetes mellitus with other skin ulcer: Secondary | ICD-10-CM | POA: Diagnosis not present

## 2016-05-14 NOTE — Progress Notes (Signed)
[name] ([id number]) Visit Report for 05/13/2016 Arrival Information Details [name]. Date of Service: 05/13/2016 9:15 AM Medical Record Number: [id number] Patient Account Number: [id number] Date of Birth/Sex: 1956-03-23 (60 y.o. Female) Treating RN: Montey Hora Primary Care Physician: [name] Other Clinician: Referring Physician: [name] Treating Physician/Extender: Frann Rider in Treatment: 72 Visit Information History Since Last Visit Added or deleted any medications: No Patient Arrived: Walker Any new allergies or adverse reactions: No Arrival Time: 09:22 Had a fall or experienced change in No Accompanied By: self activities of daily living that may affect Transfer Assistance: None risk of falls: Patient Identification Verified: Yes Signs or symptoms of abuse/neglect since last No Secondary Verification Process Yes visito Completed: Hospitalized since last visit: No Patient Requires Transmission- No Pain Present Now: No Based Precautions: Patient Has Alerts: Yes Patient Alerts: Patient on Blood Thinner Electronic Signature(s) Signed: 05/13/2016 5:04:12 PM By: Montey Hora Entered By: Montey Hora on 05/13/2016 09:23:26 Dipaola, [name] ([id number]) -------------------------------------------------------------------------------- Clinic Level of Care Assessment Details [name]. Date of Service: 05/13/2016 9:15 AM Medical Record Number: [id number] Patient Account Number: [id number] Date of Birth/Sex: 1956/02/06 (60 y.o. Female) Treating RN: Montey Hora Primary Care Physician: [name] Other Clinician: Referring Physician: [name] Treating Physician/Extender: Frann Rider in Treatment: 72 Clinic Level of Care Assessment Items TOOL 4 Quantity Score []  - Use when only an EandM is performed on FOLLOW-UP visit 0 ASSESSMENTS - Nursing Assessment / Reassessment X - Reassessment  of Co-morbidities (includes updates in patient status) 1 10 X - Reassessment of Adherence to Treatment Plan 1 5 ASSESSMENTS - Wound and Skin Assessment / Reassessment X - Simple Wound Assessment / Reassessment - one wound 1 5 []  - Complex Wound Assessment / Reassessment - multiple wounds 0 []  - Dermatologic / Skin Assessment (not related to wound area) 0 ASSESSMENTS - Focused Assessment []  - Circumferential Edema Measurements - multi extremities 0 []  - Nutritional Assessment / Counseling / Intervention 0 []  - Lower Extremity Assessment (monofilament, tuning fork, pulses) 0 []  - Peripheral Arterial Disease Assessment (using hand held doppler) 0 ASSESSMENTS - Ostomy and/or Continence Assessment and Care []  - Incontinence Assessment and Management 0 []  - Ostomy Care Assessment and Management (repouching, etc.) 0 PROCESS - Coordination of Care X - Simple Patient / Family Education for ongoing care 1 15 []  - Complex (extensive) Patient / Family Education for ongoing care 0 []  - Staff obtains Programmer, systems, Records, Test Results / Process Orders 0 []  - Staff telephones HHA, Nursing Homes / Clarify orders / etc 0 []  - Routine Transfer to another Facility (non-emergent condition) 0 [name], [name] ([id number]) []  - Routine Hospital Admission (non-emergent condition) 0 []  - New Admissions / Biomedical engineer / Ordering NPWT, Apligraf, etc. 0 []  - Emergency Hospital Admission (emergent condition) 0 X - Simple Discharge Coordination 1 10 []  - Complex (extensive) Discharge Coordination 0 PROCESS - Special Needs []  - Pediatric / Minor Patient Management 0 []  - Isolation Patient Management 0 []  - Hearing / Language / Visual special needs 0 []  - Assessment of Community assistance (transportation, D/C planning, etc.) 0 []  - Additional assistance / Altered mentation 0 []  - Support Surface(s) Assessment (bed, cushion, seat, etc.) 0 INTERVENTIONS - Wound Cleansing / Measurement X - Simple Wound  Cleansing - one wound 1 5 []  - Complex Wound Cleansing - multiple wounds 0 X - Wound Imaging (photographs - any number of wounds) 1 5 []  - Wound  Tracing (instead of photographs) 0 X - Simple Wound Measurement - one wound 1 5 []  - Complex Wound Measurement - multiple wounds 0 INTERVENTIONS - Wound Dressings X - Small Wound Dressing one or multiple wounds 1 10 []  - Medium Wound Dressing one or multiple wounds 0 []  - Large Wound Dressing one or multiple wounds 0 []  - Application of Medications - topical 0 []  - Application of Medications - injection 0 INTERVENTIONS - Miscellaneous []  - External ear exam 0 [name], [name] ([id number]) []  - Specimen Collection (cultures, biopsies, blood, body fluids, etc.) 0 []  - Specimen(s) / Culture(s) sent or taken to Lab for analysis 0 []  - Patient Transfer (multiple staff / Harrel Lemon Lift / Similar devices) 0 []  - Simple Staple / Suture removal (25 or less) 0 []  - Complex Staple / Suture removal (26 or more) 0 []  - Hypo / Hyperglycemic Management (close monitor of Blood Glucose) 0 []  - Ankle / Brachial Index (ABI) - do not check if billed separately 0 X - Vital Signs 1 5 Has the patient been seen at the hospital within the last three years: Yes Total Score: 75 Level Of Care: New/Established - Level 2 Electronic Signature(s) Signed: 05/13/2016 5:04:12 PM By: Montey Hora Entered By: Montey Hora on 05/13/2016 10:01:18 [name], [name] ([id number]) -------------------------------------------------------------------------------- Encounter Discharge Information Details [name], [name] Date of Service: 05/13/2016 9:15 AM Medical Record Number: [id number] Patient Account Number: [id number] Date of Birth/Sex: December 24, 1955 (60 y.o. Female) Treating RN: Montey Hora Primary Care Physician: [name] Other Clinician: Referring Physician: [name] Treating Physician/Extender: Frann Rider in Treatment: 17 Encounter Discharge  Information Items Discharge Pain Level: 0 Discharge Condition: Stable Ambulatory Status: Walker Discharge Destination: Home Transportation: Private Auto Accompanied By: self Schedule Follow-up Appointment: Yes Medication Reconciliation completed and provided to Patient/Care No Nathon Stefanski: Provided on Clinical Summary of Care: 05/13/2016 Form Type Recipient Paper Patient LD Electronic Signature(s) Signed: 05/13/2016 10:02:20 AM By: Montey Hora Previous Signature: 05/13/2016 9:53:51 AM Version By: Ruthine Dose Entered By: Montey Hora on 05/13/2016 10:02:19 [name], [name] ([id number]) -------------------------------------------------------------------------------- Multi Wound Chart Details [name], Sabree S. Date of Service: 05/13/2016 9:15 AM Medical Record Number: [id number] Patient Account Number: [id number] Date of Birth/Sex: Dec 13, 1955 (60 y.o. Female) Treating RN: Montey Hora Primary Care Physician: [name] Other Clinician: Referring Physician: [name] Treating Physician/Extender: Frann Rider in Treatment: 72 Vital Signs Height(in): 65 Pulse(bpm): 79 Weight(lbs): 156 Blood Pressure 162/62 (mmHg): Body Mass Index(BMI): 26 Temperature(F): 98.1 Respiratory Rate 18 (breaths/min): Photos: [N/A:N/A] Wound Location: Right Back N/A N/A Wounding Event: Blister N/A N/A Primary Etiology: Pressure Ulcer N/A N/A Comorbid History: Glaucoma, Anemia, N/A N/A Coronary Artery Disease, Hypertension, Type II Diabetes, End Stage Renal Disease, Osteoarthritis, Neuropathy Date Acquired: 10/19/2013 N/A N/A Weeks of Treatment: 72 N/A N/A Wound Status: Open N/A N/A Measurements L x W x D 0.1x0.1x0.1 N/A N/A (cm) Area (cm) : 0.008 N/A N/A Volume (cm) : 0.001 N/A N/A % Reduction in Area: 100.00% N/A N/A % Reduction in Volume: 100.00% N/A N/A Classification: Category/Stage II N/A N/A Exudate Amount: Small N/A N/A Exudate Type: Sanguinous  N/A N/A Exudate Color: red N/A N/A Wound Margin: Flat and Intact N/A N/A [name], [name] ([id number]) Granulation Amount: Large (67-100%) N/A N/A Granulation Quality: Red, Friable N/A N/A Necrotic Amount: None Present (0%) N/A N/A Exposed Structures: Fascia: No N/A N/A Fat: No Tendon: No Muscle: No Joint: No Bone: No Limited to Skin Breakdown Epithelialization: Small (1-33%) N/A N/A Periwound  Skin Texture: Scarring: Yes N/A N/A Edema: No Excoriation: No Induration: No Callus: No Crepitus: No Fluctuance: No Friable: No Rash: No Periwound Skin Moist: Yes N/A N/A Moisture: Maceration: No Dry/Scaly: No Periwound Skin Color: Ecchymosis: Yes N/A N/A Atrophie Blanche: No Cyanosis: No Erythema: No Hemosiderin Staining: No Mottled: No Pallor: No Rubor: No Temperature: No Abnormality N/A N/A Tenderness on No N/A N/A Palpation: Wound Preparation: Ulcer Cleansing: N/A N/A Rinsed/Irrigated with Saline Topical Anesthetic Applied: None Treatment Notes Electronic Signature(s) Signed: 05/13/2016 5:04:12 PM By: Montey Hora Entered By: Montey Hora on 05/13/2016 09:43:50 [name], [name] ([id number]) -------------------------------------------------------------------------------- [place] Details [name]. Date of Service: 05/13/2016 9:15 AM Medical Record Number: [id number] Patient Account Number: [id number] Date of Birth/Sex: 03/19/1956 (60 y.o. Female) Treating RN: Montey Hora Primary Care Physician: [name] Other Clinician: Referring Physician: [name] Treating Physician/Extender: Frann Rider in Treatment: 23 Active Inactive Abuse / Safety / Falls / Self Care Management Nursing Diagnoses: Potential for falls Goals: Patient will remain injury free Date Initiated: 12/23/2014 Goal Status: Active Patient/caregiver will verbalize understanding of skin care regimen Date Initiated: 12/23/2014 Goal Status:  Active Patient/caregiver will verbalize/demonstrate measures taken to prevent injury and/or falls Date Initiated: 12/23/2014 Goal Status: Active Patient/caregiver will verbalize/demonstrate understanding of what to do in case of emergency Date Initiated: 12/23/2014 Goal Status: Active Interventions: Assess fall risk on admission and as needed Assess: immobility, friction, shearing, incontinence upon admission and as needed Assess impairment of mobility on admission and as needed per policy Assess self care needs on admission and as needed Provide education on fall prevention Notes: Wound/Skin Impairment Nursing Diagnoses: Impaired tissue integrity Knowledge deficit related to ulceration/compromised skin integrity Goals: Patient/caregiver will verbalize understanding of skin care regimen [name], [name] ([id number]) Date Initiated: 12/23/2014 Goal Status: Active Ulcer/skin breakdown will heal within 14 weeks Date Initiated: 12/23/2014 Goal Status: Active Interventions: Assess patient/caregiver ability to obtain necessary supplies Assess patient/caregiver ability to perform ulcer/skin care regimen upon admission and as needed Assess ulceration(s) every visit Provide education on ulcer and skin care Treatment Activities: Skin care regimen initiated : 11/05/2015 Topical wound management initiated : 11/05/2015 Notes: Electronic Signature(s) Signed: 05/13/2016 5:04:12 PM By: Montey Hora Entered By: Montey Hora on 05/13/2016 09:43:40 [name], [name] ([id number]) -------------------------------------------------------------------------------- Pain Assessment Details [name]. Date of Service: 05/13/2016 9:15 AM Medical Record Number: [id number] Patient Account Number: [id number] Date of Birth/Sex: 09-Jul-1956 (60 y.o. Female) Treating RN: Montey Hora Primary Care Physician: [name] Other Clinician: Referring Physician: [name] Treating  Physician/Extender: Frann Rider in Treatment: 72 Active Problems Location of Pain Severity and Description of Pain Patient Has Paino No Site Locations Pain Management and Medication Current Pain Management: Notes Topical or injectable lidocaine is offered to patient for acute pain when surgical debridement is performed. If needed, Patient is instructed to use over the counter pain medication for the following 24-48 hours after debridement. Wound care MDs do not prescribed pain medications. Patient has chronic pain or uncontrolled pain. Patient has been instructed to make an appointment with their Primary Care Physician for pain management. Electronic Signature(s) Signed: 05/13/2016 5:04:12 PM By: Montey Hora Entered By: Montey Hora on 05/13/2016 09:23:50 [name], [name] ([id number]) -------------------------------------------------------------------------------- Patient/Caregiver Education Details [name]. Date of Service: 05/13/2016 9:15 AM Medical Record Number: [id number] Patient Account Number: [id number] Date of Birth/Gender: 25-Mar-1956 (60 y.o. Female) Treating RN: Montey Hora Primary Care Physician: [name] Other Clinician: Referring Physician: [name]  Treating Physician/Extender: Frann Rider in Treatment: 23 Education Assessment Education Provided To: Patient Education Topics Provided Wound/Skin Impairment: Handouts: Other: wound care as ordered Methods: Demonstration, Explain/Verbal Responses: State content correctly Electronic Signature(s) Signed: 05/13/2016 5:04:12 PM By: Montey Hora Entered By: Montey Hora on 05/13/2016 10:02:42 [name], [name] ([id number]) -------------------------------------------------------------------------------- Wound Assessment Details [name], [name] S. Date of Service: 05/13/2016 9:15 AM Medical Record Number: [id number] Patient Account Number: [id number] Date  of Birth/Sex: 1956/06/30 (60 y.o. Female) Treating RN: Montey Hora Primary Care Physician: [name] Other Clinician: Referring Physician: [name] Treating Physician/Extender: Frann Rider in Treatment: 72 Wound Status Wound Number: 1 Primary Pressure Ulcer Etiology: Wound Location: Right Back Wound Open Wounding Event: Blister Status: Date Acquired: 10/19/2013 Comorbid Glaucoma, Anemia, Coronary Artery Weeks Of Treatment: 72 History: Disease, Hypertension, Type II Clustered Wound: No Diabetes, End Stage Renal Disease, Osteoarthritis, Neuropathy Photos Wound Measurements Length: (cm) 0.1 Width: (cm) 0.1 Depth: (cm) 0.1 Area: (cm) 0.008 Volume: (cm) 0.001 % Reduction in Area: 100% % Reduction in Volume: 100% Epithelialization: Small (1-33%) Tunneling: No Undermining: No Wound Description Classification: Category/Stage II Wound Margin: Flat and Intact Exudate Amount: Small Exudate Type: Sanguinous Exudate Color: red Foul Odor After Cleansing: No Wound Bed Granulation Amount: Large (67-100%) Exposed Structure Granulation Quality: Red, Friable Fascia Exposed: No Necrotic Amount: None Present (0%) Fat Layer Exposed: No Tendon Exposed: No Novelo, Seriyah S. ([id number]) Muscle Exposed: No Joint Exposed: No Bone Exposed: No Limited to Skin Breakdown Periwound Skin Texture Texture Color No Abnormalities Noted: No No Abnormalities Noted: No Callus: No Atrophie Blanche: No Crepitus: No Cyanosis: No Excoriation: No Ecchymosis: Yes Fluctuance: No Erythema: No Friable: No Hemosiderin Staining: No Induration: No Mottled: No Localized Edema: No Pallor: No Rash: No Rubor: No Scarring: Yes Temperature / Pain Moisture Temperature: No Abnormality No Abnormalities Noted: No Dry / Scaly: No Maceration: No Moist: Yes Wound Preparation Ulcer Cleansing: Rinsed/Irrigated with Saline Topical Anesthetic Applied: None Treatment Notes Wound #1  (Right Back) 1. Cleansed with: Clean wound with Normal Saline 3. Peri-wound Care: Skin Prep 4. Dressing Applied: Other dressing (specify in notes) 5. Secondary Roxana Notes tritec silver foam Electronic Signature(s) Signed: 05/13/2016 5:04:12 PM By: Montey Hora Entered By: Montey Hora on 05/13/2016 09:42:54 Leverich, [name] ([id number]) -------------------------------------------------------------------------------- Vitals Details [name]. Date of Service: 05/13/2016 9:15 AM Medical Record Number: [id number] Patient Account Number: [id number] Date of Birth/Sex: [birth year] (60 y.o. Female) Treating RN: Montey Hora Primary Care Physician: [name] Other Clinician: Referring Physician: [name] Treating Physician/Extender: Frann Rider in Treatment: 72 Vital Signs Time Taken: 09:24 Temperature (F): 98.1 Height (in): 65 Pulse (bpm): 79 Weight (lbs): 156 Respiratory Rate (breaths/min): 18 Body Mass Index (BMI): 26 Blood Pressure (mmHg): 162/62 Reference Range: 80 - 120 mg / dl Electronic Signature(s) Signed: 05/13/2016 5:04:12 PM By: Montey Hora Entered By: Montey Hora on 05/13/2016 [id number]

## 2016-05-14 NOTE — Progress Notes (Signed)
VERSA, MARSON ([id number]) Visit Report for 05/13/2016 Chief Complaint Document Details [name], [name] 05/13/2016 9:15 Date of Service: AM Medical Record [id number] Number: Patient Account Number: [id number] 08/20/1955 (60 y.o. Treating RN: [name] Date of Birth/Sex: Female) Other Clinician: Primary Care Physician: [name] Treating [name] Referring Physician: Nicky [name] Physician/Extender: [name]: 72 Information Obtained from: Patient Chief Complaint Patient presents to the wound care center for a consult due non healing wound. She has an open wound on her right upper back which she's had for about a year and she recently noticed a blister on her right lower extremity about 2 weeks ago. Electronic Signature(s) Signed: 05/13/2016 9:45:26 AM By: [name] Fudge MD, [name] Entered By: [name] on 05/13/2016 09:45:26 [name] ([id number]) -------------------------------------------------------------------------------- HPI Details [name], [name] 05/13/2016 9:15 Date of Service: AM Medical Record [id number] Number: Patient Account Number: [id number] May 06, 1956 (60 y.o. Treating RN: [name] Date of Birth/Sex: Female) Other Clinician: Primary Care Physician: [name] Treating [name] Referring Physician: Nicky [name] Physician/Extender: [name]: 43 History of Present Illness Location: right upper back and right lower extremity wounds Quality: Patient reports No Pain. Severity: Patient states wound (s) are getting better. Duration: Patient has had the wound for > 3 months prior to seeking treatment at the wound center Timing: she thought it first occurred when she was using a heating pad about a year ago after back surgery. Context: The wound appeared gradually over time Modifying Factors: Patient is currently on renal dialysis and receives treatments 3 times weekly Associated  Signs and Symptoms: Patient reports having: surgery scheduled for this week for a AV fistula left arm. HPI Description: 60 year old patient who is known to be a diabetic and has end-stage renal disease has had several comorbidities including coronary artery disease, hypertension, hyperlipidemia, pancreatitis, anemia, previous history of hysterectomy, cholecystectomy, left-sided salivary gland excision, bilateral cataract surgery,Peritoneal dialysis catheter, hemodialysis catheter. the area on the back has also been caused by instant pressure she used to sleep on a recliner all day and has significant kyphoscoliosis. As far as the wound on her right lower extremity she's not sure how this blister occurred but she thought it has been there for about 2 weeks. No recent blood investigations available and no recent hemoglobin A1c. 12/30/2014 -- she is an assisted living facility but I believe the nurses that have not followed instructions as she had some cream applied on her back and there was a different dressing. Last week she's had a AV fistula placed on her left forearm. 01/13/2015 -- she has had some localized infection at the port site and she's been on doxycycline for this. 02/05/2015 - he has developed a small blister on her right lower extremity. 02/10/2015 -- she has developed another small blister on her right anterior chest wall in the area where she's had tape for her dialysis access. this may just be injury caused by a tape burn. 02/18/2015 -- no new blisters and she had a dermatology opinion and they have taken a biopsy of her skin. She also had a left brachial AV fistula placed this week. 03/03/2015 -- though we do not have the pathology report yet the patient says she has been put on prednisone because this skin disease is possibly to do with her immune system and her dermatologist is recommended this. 03/10/2015 -- she has developed a new blister which is quite large on her right  lower extremity on the shin.  05/22/2015 -- she did very well with her dressing but on trying to remove the [place] it peels away [name], [name] ([id number]) the newly healed skin. 06/05/2015 -- the patient was in the ER this past week for severe bleeding from the right nostril and had to have ENT see her and do a packing. They've also been holding her heparin during her dialysis. The patient is going back to ENT today. Other than that she has had no other significant issues. 06/19/2015 -- she is back on her blood thinners and continues to have her hemodialysis as before. 07/31/2015 -- a large blister has popped up again on her right lower extremity in the same place in the mid shin area in the anterior lateral compartment. 10/05/2015 -- last week she was admitted to the hospital for 4 days as she had a pneumonia and was treated with IV and oral antibiotics. 04/22/16: f/u for right upper back wound. no systemic s/s of infection. no new wounds or skin breakdown reported. 04/29/16: returns today for f/u. no new wounds or skin breakdown. no systemic s/s of infection. no interval changes regarding health status. Electronic Signature(s) Signed: 05/13/2016 9:45:35 AM By: [name] Fudge MD, [name] Entered By: [name] on 05/13/2016 09:45:33 [name], [name] ([id number]) -------------------------------------------------------------------------------- Physical Exam Details [name] 05/13/2016 9:15 Date of Service: AM Medical Record [id number] Number: Patient Account Number: [id number] 1956-06-18 (60 y.o. Treating RN: [name] Date of Birth/Sex: Female) Other Clinician: Primary Care Physician: [name] Treating [name] Referring Physician: Nicky [name] Physician/Extender: [name]: 72 Constitutional . Pulse regular. Respirations normal and unlabored. Afebrile. . Eyes Nonicteric. Reactive to light. Ears, Nose, Mouth, and Throat Lips,  teeth, and gums WNL.Marland Kitchen Moist mucosa without lesions. Neck supple and nontender. No palpable supraclavicular or cervical adenopathy. Normal sized without goiter. Respiratory WNL. No retractions.. Breath sounds WNL, No rubs, rales, rhonchi, or wheeze.. Cardiovascular Heart rhythm and rate regular, no murmur or gallop.. Pedal Pulses WNL. No clubbing, cyanosis or edema. Chest Breasts symmetical and no nipple discharge.. Breast tissue WNL, no masses, lumps, or tenderness.. Lymphatic No adneopathy. No adenopathy. No adenopathy. Musculoskeletal Adexa without tenderness or enlargement.. Digits and nails w/o clubbing, cyanosis, infection, petechiae, ischemia, or inflammatory conditions.. Integumentary (Hair, Skin) No suspicious lesions. No crepitus or fluctuance. No peri-wound warmth or erythema. No masses.Marland Kitchen Psychiatric Judgement and insight Intact.. No evidence of depression, anxiety, or agitation.. Notes except for a 1 cmo area in the center of the wound the rest of it has epithelialized nicely and has supple scar. No sharp debridement was required today. Electronic Signature(s) Signed: 05/13/2016 9:46:52 AM By: [name] Fudge MD, [name] Entered By: [name] on 05/13/2016 09:46:52 [name], [name] ([id number]) -------------------------------------------------------------------------------- Physician Orders Details Patient Name: TYREANNA, GHAZAL 05/13/2016 9:15 Date of Service: AM Medical Record [id number] Number: Patient Account Number: [id number] [birth year] (60 y.o. Treating RN: [name] Date of Birth/Sex: Female) Other Clinician: Primary Care Physician: [name] Treating Drewey Begue Referring Physician: Nicky [name] Physician/Extender: [name]: 58 Verbal / Phone Orders: Yes Clinician: Montey [name] Read Back and Verified: Yes Diagnosis Coding Wound Cleansing Wound #1 Right Back o Clean wound with Normal Saline. o May Shower, gently pat wound dry  prior to applying new dressing. - PATIENT MAY SHOWER AND [place] WOUND WITH SOAP AND WATER WITH CNA ASSISTANCE AND PAT WOUND DRY AND SNF NURSE TO APPLY NEW DRESSING RIGHT AFTERWARDS Skin Barriers/Peri-Wound Care Wound #1 Right Back o Skin Prep Primary Wound Dressing  Wound #1 Right Back o Other: - Tritec Silver Ultra Foam - wet prior to removing; Cover with [facility] Dressing Dressing Change Frequency Wound #1 Right Back o Other: - Change when soiled Follow-up Appointments Wound #1 Right Back o Return Appointment in 1 week. Electronic Signature(s) Signed: 05/13/2016 4:11:36 PM By: [name] Fudge MD, [name] Signed: 05/13/2016 5:04:12 PM By: [name] Entered By: [name] on 05/13/2016 09:44:22 [name], [name] ([id number]) -------------------------------------------------------------------------------- Problem List Details Patient Name: AHMIRA, TRILLO 05/13/2016 9:15 Date of Service: AM Medical Record [id number] Number: Patient Account Number: [id number] Aug 03, 1955 (60 y.o. Treating RN: [name] Date of Birth/Sex: Female) Other Clinician: Primary Care Physician: [name] Treating Baleria Wyman Referring Physician: Nicky [name] Physician/Extender: [name]: 72 Active Problems ICD-10 Encounter Code Description Active Date Diagnosis E11.622 Type 2 diabetes mellitus with other skin ulcer 12/23/2014 Yes L89.112 Pressure ulcer of right upper back, stage 2 12/23/2014 Yes S80.821A Blister (nonthermal), right lower leg, initial encounter 12/23/2014 Yes N18.6 End stage renal disease 12/23/2014 Yes Inactive Problems Resolved Problems Electronic Signature(s) Signed: 05/13/2016 9:44:56 AM By: [name] Fudge MD, [name] Entered By: [name] on 05/13/2016 09:44:56 [name], [name] ([id number]) -------------------------------------------------------------------------------- Progress Note Details [name], HOELZEL. 05/13/2016 9:15 Date of  Service: AM Medical Record [id number] Number: Patient Account Number: [id number] [birth year] (60 y.o. Treating RN: [name] Date of Birth/Sex: Female) Other Clinician: Primary Care Physician: [name] Treating Tokiko Diefenderfer Referring Physician: Nicky [name] Physician/Extender: [name]: 72 Subjective Chief Complaint Information obtained from Patient Patient presents to the wound care center for a consult due non healing wound. She has an open wound on her right upper back which she's had for about a year and she recently noticed a blister on her right lower extremity about 2 weeks ago. History of Present Illness (HPI) The following HPI elements were documented for the patient's wound: Location: right upper back and right lower extremity wounds Quality: Patient reports No Pain. Severity: Patient states wound (s) are getting better. Duration: Patient has had the wound for > 3 months prior to seeking treatment at the wound center Timing: she thought it first occurred when she was using a heating pad about a year ago after back surgery. Context: The wound appeared gradually over time Modifying Factors: Patient is currently on renal dialysis and receives treatments 3 times weekly Associated Signs and Symptoms: Patient reports having: surgery scheduled for this week for a AV fistula left arm. 60 year old patient who is known to be a diabetic and has end-stage renal disease has had several comorbidities including coronary artery disease, hypertension, hyperlipidemia, pancreatitis, anemia, previous history of hysterectomy, cholecystectomy, left-sided salivary gland excision, bilateral cataract surgery,Peritoneal dialysis catheter, hemodialysis catheter. the area on the back has also been caused by instant pressure she used to sleep on a recliner all day and has significant kyphoscoliosis. As far as the wound on her right lower extremity she's not sure how this blister  occurred but she thought it has been there for about 2 weeks. No recent blood investigations available and no recent hemoglobin A1c. 12/30/2014 -- she is an assisted living facility but I believe the nurses that have not followed instructions as she had some cream applied on her back and there was a different dressing. Last week she's had a AV fistula placed on her left forearm. 01/13/2015 -- she has had some localized infection at the port site and she's been on doxycycline for this. 02/05/2015 - he has developed a small  blister on her right lower extremity. 02/10/2015 -- she has developed another small blister on her right anterior chest wall in the area where she's had tape for her dialysis access. this may just be injury caused by a tape burn. [name], [name] ([id number]) 02/18/2015 -- no new blisters and she had a dermatology opinion and they have taken a biopsy of her skin. She also had a left brachial AV fistula placed this week. 03/03/2015 -- though we do not have the pathology report yet the patient says she has been put on prednisone because this skin disease is possibly to do with her immune system and her dermatologist is recommended this. 03/10/2015 -- she has developed a new blister which is quite large on her right lower extremity on the shin. 05/22/2015 -- she did very well with her dressing but on trying to remove the [place] it peels away the newly healed skin. 06/05/2015 -- the patient was in the ER this past week for severe bleeding from the right nostril and had to have ENT see her and do a packing. They've also been holding her heparin during her dialysis. The patient is going back to ENT today. Other than that she has had no other significant issues. 06/19/2015 -- she is back on her blood thinners and continues to have her hemodialysis as before. 07/31/2015 -- a large blister has popped up again on her right lower extremity in the same place in the mid shin area in  the anterior lateral compartment. 10/05/2015 -- last week she was admitted to the hospital for 4 days as she had a pneumonia and was treated with IV and oral antibiotics. 04/22/16: f/u for right upper back wound. no systemic s/s of infection. no new wounds or skin breakdown reported. 04/29/16: returns today for f/u. no new wounds or skin breakdown. no systemic s/s of infection. no interval changes regarding health status. Objective Constitutional Pulse regular. Respirations normal and unlabored. Afebrile. Vitals Time Taken: 9:24 AM, Height: 65 in, Weight: 156 lbs, BMI: 26, Temperature: 98.1 F, Pulse: 79 bpm, Respiratory Rate: 18 breaths/min, Blood Pressure: 162/62 mmHg. Eyes Nonicteric. Reactive to light. Ears, Nose, Mouth, and Throat Lips, teeth, and gums WNL.Marland Kitchen Moist mucosa without lesions. Neck supple and nontender. No palpable supraclavicular or cervical adenopathy. Normal sized without goiter. Respiratory WNL. No retractions.. Breath sounds WNL, No rubs, rales, rhonchi, or wheeze.[name] ([id number]) Cardiovascular Heart rhythm and rate regular, no murmur or gallop.. Pedal Pulses WNL. No clubbing, cyanosis or edema. Chest Breasts symmetical and no nipple discharge.. Breast tissue WNL, no masses, lumps, or tenderness.. Lymphatic No adneopathy. No adenopathy. No adenopathy. Musculoskeletal Adexa without tenderness or enlargement.. Digits and nails w/o clubbing, cyanosis, infection, petechiae, ischemia, or inflammatory conditions.Marland Kitchen Psychiatric Judgement and insight Intact.. No evidence of depression, anxiety, or agitation.. General Notes: except for a 1 cm area in the center of the wound the rest of it has epithelialized nicely and has supple scar. No sharp debridement was required today. Integumentary (Hair, Skin) No suspicious lesions. No crepitus or fluctuance. No peri-wound warmth or erythema. No masses.. Wound #1 status is Open. Original cause of wound was  Blister. The wound is located on the Right Back. The wound measures 0.1cm length x 0.1cm width x 0.1cm depth; 0.008cm^2 area and 0.001cm^3 volume. The wound is limited to skin breakdown. There is no tunneling or undermining noted. There is a small amount of sanguinous drainage noted. The wound margin is flat and intact. There is large (  67-100%) red, friable granulation within the wound bed. There is no necrotic tissue within the wound bed. The periwound skin appearance exhibited: Scarring, Moist, Ecchymosis. The periwound skin appearance did not exhibit: Callus, Crepitus, Excoriation, Fluctuance, Friable, Induration, Localized Edema, Rash, Dry/Scaly, Maceration, Atrophie Blanche, Cyanosis, Hemosiderin Staining, Mottled, Pallor, Rubor, Erythema. Periwound temperature was noted as No Abnormality. Assessment Active Problems ICD-10 E11.622 - Type 2 diabetes mellitus with other skin ulcer L89.112 - Pressure ulcer of right upper back, stage 2 S80.821A - Blister (nonthermal), right lower leg, initial encounter N18.6 - End stage renal disease [name], [name] ([id number]) Plan Wound Cleansing: Wound #1 Right Back: Clean wound with Normal Saline. May Shower, gently pat wound dry prior to applying new dressing. - PATIENT MAY SHOWER AND [place] WITH SOAP AND WATER WITH CNA ASSISTANCE AND PAT WOUND DRY AND SNF NURSE TO APPLY NEW DRESSING RIGHT AFTERWARDS Skin Barriers/Peri-Wound Care: Wound #1 Right Back: Skin Prep Primary Wound Dressing: Wound #1 Right Back: Other: - Tritec Silver Ultra Foam - wet prior to removing; Cover with [facility] Dressing Dressing Change Frequency: Wound #1 Right Back: Other: - Change when soiled Follow-up Appointments: Wound #1 Right Back: Return Appointment in 1 week. In view of the fragile skin I have been using Tritec Ultra silver foam, and she has done very well with this material. Last week she's had an excellent result with this. Electronic  Signature(s) Signed: 05/13/2016 9:47:50 AM By: [name] Fudge MD, [name] Entered By: [name] on 05/13/2016 09:47:49 [name], [name] ([id number]) -------------------------------------------------------------------------------- SuperBill Details [name]. Date of Service: 05/13/2016 Medical Record Number: [id number] Patient Account Number: [id number] Date of Birth/Sex: 06/09/1956 (60 y.o. Female) Treating RN: [name] Primary Care Physician: [name] Other Clinician: Referring Physician: Nicky [name] Treating Physician/Extender: Frann Rider in Treatment: 72 Diagnosis Coding ICD-10 Codes Code Description E11.622 Type 2 diabetes mellitus with other skin ulcer L89.112 Pressure ulcer of right upper back, stage 2 S80.821A Blister (nonthermal), right lower leg, initial encounter N18.6 End stage renal disease Facility Procedures CPT4 Code: [id number] Description: [phone] - WOUND CARE VISIT-LEV 2 EST PT Modifier: Quantity: 1 Physician Procedures CPT4 Code: [id number] Description: [id number] - WC PHYS LEVEL 3 - EST PT ICD-10 Description Diagnosis E11.622 Type 2 diabetes mellitus with other skin ulcer L89.112 Pressure ulcer of right upper back, stage 2 S80.821A Blister (nonthermal), right lower leg, initial enc N18.6 End  stage renal disease Modifier: ounter Quantity: 1 Electronic Signature(s) Signed: 05/13/2016 10:01:31 AM By: [name] Signed: 05/13/2016 4:11:36 PM By: [name] Fudge MD, [name] Previous Signature: 05/13/2016 9:48:05 AM Version By: [name] Fudge MD, [name] Entered By: [name] on 05/13/2016 10:01:30

## 2016-05-27 ENCOUNTER — Encounter: Payer: Medicare Other | Attending: Surgery | Admitting: Surgery

## 2016-05-27 DIAGNOSIS — X58XXXA Exposure to other specified factors, initial encounter: Secondary | ICD-10-CM | POA: Insufficient documentation

## 2016-05-27 DIAGNOSIS — I120 Hypertensive chronic kidney disease with stage 5 chronic kidney disease or end stage renal disease: Secondary | ICD-10-CM | POA: Diagnosis not present

## 2016-05-27 DIAGNOSIS — Z992 Dependence on renal dialysis: Secondary | ICD-10-CM | POA: Diagnosis not present

## 2016-05-27 DIAGNOSIS — E1122 Type 2 diabetes mellitus with diabetic chronic kidney disease: Secondary | ICD-10-CM | POA: Diagnosis not present

## 2016-05-27 DIAGNOSIS — E11622 Type 2 diabetes mellitus with other skin ulcer: Secondary | ICD-10-CM | POA: Diagnosis present

## 2016-05-27 DIAGNOSIS — L89112 Pressure ulcer of right upper back, stage 2: Secondary | ICD-10-CM | POA: Insufficient documentation

## 2016-05-27 DIAGNOSIS — D649 Anemia, unspecified: Secondary | ICD-10-CM | POA: Insufficient documentation

## 2016-05-27 DIAGNOSIS — S80821A Blister (nonthermal), right lower leg, initial encounter: Secondary | ICD-10-CM | POA: Insufficient documentation

## 2016-05-27 DIAGNOSIS — N186 End stage renal disease: Secondary | ICD-10-CM | POA: Diagnosis not present

## 2016-05-27 DIAGNOSIS — I2510 Atherosclerotic heart disease of native coronary artery without angina pectoris: Secondary | ICD-10-CM | POA: Diagnosis not present

## 2016-05-27 DIAGNOSIS — E785 Hyperlipidemia, unspecified: Secondary | ICD-10-CM | POA: Diagnosis not present

## 2016-05-30 NOTE — Progress Notes (Signed)
DAWNI, SCHWERY ([id number]) Visit Report for 05/27/2016 Chief Complaint Document Details [name], [name] 05/27/2016 9:15 Date of Service: AM Medical Record [id number] Number: Patient Account Number: [id number] 06/26/1956 (60 y.o. Treating RN: Montey Hora Date of Birth/Sex: Female) Other Clinician: Primary Care Physician: [name] Treating Thomasina Housley Referring Physician: [name] Physician/Extender: [name]: 74 Information Obtained from: Patient Chief Complaint Patient presents to the wound care center for a consult due non healing wound. She has an open wound on her right upper back which she's had for about a year and she recently noticed a blister on her right lower extremity about 2 weeks ago. Electronic Signature(s) Signed: 05/27/2016 10:09:59 AM By: [name], [name] Entered By: [name] on 05/27/2016 10:09:59 Perrow, [name] ([id number]) -------------------------------------------------------------------------------- HPI Details [name], [name] 05/27/2016 9:15 Date of Service: AM Medical Record [id number] Number: Patient Account Number: [id number] 1955-09-05 (60 y.o. Treating RN: Montey Hora Date of Birth/Sex: Female) Other Clinician: Primary Care Physician: [name] Treating Nakya Weyand Referring Physician: [name] Physician/Extender: [name]: 74 History of Present Illness Location: right upper back and right lower extremity wounds Quality: Patient reports No Pain. Severity: Patient states wound (s) are getting better. Duration: Patient has had the wound for > 3 months prior to seeking treatment at the wound center Timing: she thought it first occurred when she was using a heating pad about a year ago after back surgery. Context: The wound appeared gradually over time Modifying Factors: Patient is currently on renal dialysis and receives treatments 3 times weekly Associated  Signs and Symptoms: Patient reports having: surgery scheduled for this week for a AV fistula left arm. HPI Description: 60 year old patient who is known to be a diabetic and has end-stage renal disease has had several comorbidities including coronary artery disease, hypertension, hyperlipidemia, pancreatitis, anemia, previous history of hysterectomy, cholecystectomy, left-sided salivary gland excision, bilateral cataract surgery,Peritoneal dialysis catheter, hemodialysis catheter. the area on the back has also been caused by instant pressure she used to sleep on a recliner all day and has significant kyphoscoliosis. As far as the wound on her right lower extremity she's not sure how this blister occurred but she thought it has been there for about 2 weeks. No recent blood investigations available and no recent hemoglobin A1c. 12/30/2014 -- she is an assisted living facility but I believe the nurses that have not followed instructions as she had some cream applied on her back and there was a different dressing. Last week she's had a AV fistula placed on her left forearm. 01/13/2015 -- she has had some localized infection at the port site and she's been on doxycycline for this. 02/05/2015 - he has developed a small blister on her right lower extremity. 02/10/2015 -- she has developed another small blister on her right anterior chest wall in the area where she's had tape for her dialysis access. this may just be injury caused by a tape burn. 02/18/2015 -- no new blisters and she had a dermatology opinion and they have taken a biopsy of her skin. She also had a left brachial AV fistula placed this week. 03/03/2015 -- though we do not have the pathology report yet the patient says she has been put on prednisone because this skin disease is possibly to do with her immune system and her dermatologist is recommended this. 03/10/2015 -- she has developed a new blister which is quite large on her right  lower extremity on the shin.  05/22/2015 -- she did very well with her dressing but on trying to remove the [place] it peels away [name], [name] ([id number]) the newly healed skin. 06/05/2015 -- the patient was in the ER this past week for severe bleeding from the right nostril and had to have ENT see her and do a packing. They've also been holding her heparin during her dialysis. The patient is going back to ENT today. Other than that she has had no other significant issues. 06/19/2015 -- she is back on her blood thinners and continues to have her hemodialysis as before. 07/31/2015 -- a large blister has popped up again on her right lower extremity in the same place in the mid shin area in the anterior lateral compartment. 10/05/2015 -- last week she was admitted to the hospital for 4 days as she had a pneumonia and was treated with IV and oral antibiotics. 04/22/16: f/u for right upper back wound. no systemic s/s of infection. no new wounds or skin breakdown reported. 04/29/16: returns today for f/u. no new wounds or skin breakdown. no systemic s/s of infection. no interval changes regarding health status. 05/27/2016 -- she continues to come to see [place] for palliative care or complex care as the problem almost heals and then the blister reopens. Electronic Signature(s) Signed: 05/27/2016 10:10:37 AM By: [name], [name] Entered By: [name] on 05/27/2016 10:10:36 [name], [name] ([id number]) -------------------------------------------------------------------------------- Physical Exam Details [name], [name] 05/27/2016 9:15 Date of Service: AM Medical Record [id number] Number: Patient Account Number: [id number] [birth year] (61 y.o. Treating RN: Montey Hora Date of Birth/Sex: Female) Other Clinician: Primary Care Physician: [name] Treating Lively Haberman Referring Physician: [name] Physician/Extender: [name]:  74 Constitutional . Pulse regular. Respirations normal and unlabored. Afebrile. . Eyes Nonicteric. Reactive to light. Ears, Nose, Mouth, and Throat Lips, teeth, and gums WNL.Marland Kitchen Moist mucosa without lesions. Neck supple and nontender. No palpable supraclavicular or cervical adenopathy. Normal sized without goiter. Respiratory WNL. No retractions.. Cardiovascular Pedal Pulses WNL. No clubbing, cyanosis or edema. Lymphatic No adneopathy. No adenopathy. No adenopathy. Musculoskeletal Adexa without tenderness or enlargement.. Digits and nails w/o clubbing, cyanosis, infection, petechiae, ischemia, or inflammatory conditions.. Integumentary (Hair, Skin) No suspicious lesions. No crepitus or fluctuance. No peri-wound warmth or erythema. No masses.Marland Kitchen Psychiatric Judgement and insight Intact.. No evidence of depression, anxiety, or agitation.. Notes the scar tissue is very supple and well healed except for a small area in the center of the wound where 2 areas have not epithelialized yet. no debridement was required today. Electronic Signature(s) Signed: 05/27/2016 10:11:59 AM By: [name], [name] Entered By: [name] on 05/27/2016 10:11:59 [name], [name] ([id number]) -------------------------------------------------------------------------------- Physician Orders Details [name], [name] 05/27/2016 9:15 Date of Service: AM Medical Record [id number] Number: Patient Account Number: [id number] 11/22/1955 (60 y.o. Treating RN: Montey Hora Date of Birth/Sex: Female) Other Clinician: Primary Care Physician: [name] Treating Noble Bodie Referring Physician: [name] Physician/Extender: [name]: 83 Verbal / Phone Orders: Yes Clinician: Montey Hora Read Back and Verified: Yes Diagnosis Coding Wound Cleansing Wound #1 Right Back o Clean wound with Normal Saline. o May Shower, gently pat wound dry prior to applying new dressing. -  PATIENT MAY SHOWER AND WASH WOUND WITH SOAP AND WATER WITH CNA ASSISTANCE AND PAT WOUND DRY AND SNF NURSE TO APPLY NEW DRESSING RIGHT AFTERWARDS Skin Barriers/Peri-Wound Care Wound #1 Right Back o Skin Prep Primary Wound Dressing Wound #1 Right Back o  Other: - Tritec Silver Ultra Foam - wet prior to removing; Cover with [facility] Dressing Dressing Change Frequency Wound #1 Right Back o Other: - Change when soiled Follow-up Appointments Wound #1 Right Back o Return Appointment in 1 week. Electronic Signature(s) Signed: 05/27/2016 4:13:44 PM By: [name], [name] Signed: 05/30/2016 4:15:03 PM By: Montey Hora Entered By: Montey Hora on 05/27/2016 10:06:57 [name], [name] ([id number]) -------------------------------------------------------------------------------- Problem List Details [name], [name] 05/27/2016 9:15 Date of Service: AM Medical Record [id number] Number: Patient Account Number: [id number] December 25, 1955 (60 y.o. Treating RN: Montey Hora Date of Birth/Sex: Female) Other Clinician: Primary Care Physician: [name] Treating Kleo Dungee Referring Physician: [name] Physician/Extender: [name]: 74 Active Problems ICD-10 Encounter Code Description Active Date Diagnosis E11.622 Type 2 diabetes mellitus with other skin ulcer 12/23/2014 Yes L89.112 Pressure ulcer of right upper back, stage 2 12/23/2014 Yes S80.821A Blister (nonthermal), right lower leg, initial encounter 12/23/2014 Yes N18.6 End stage renal disease 12/23/2014 Yes Inactive Problems Resolved Problems Electronic Signature(s) Signed: 05/27/2016 10:09:50 AM By: [name], [name] Entered By: [name] on 05/27/2016 10:09:50 [name], [name] ([id number]) -------------------------------------------------------------------------------- Progress Note Details [name], [name] 05/27/2016 9:15 Date of Service: AM Medical  Record [id number] Number: Patient Account Number: [id number] 1956-02-04 (60 y.o. Treating RN: Montey Hora Date of Birth/Sex: Female) Other Clinician: Primary Care Physician: [name] Treating Liesl Simons Referring Physician: [name] Physician/Extender: [name]: 74 Subjective Chief Complaint Information obtained from Patient Patient presents to the wound care center for a consult due non healing wound. She has an open wound on her right upper back which she's had for about a year and she recently noticed a blister on her right lower extremity about 2 weeks ago. History of Present Illness (HPI) The following HPI elements were documented for the patient's wound: Location: right upper back and right lower extremity wounds Quality: Patient reports No Pain. Severity: Patient states wound (s) are getting better. Duration: Patient has had the wound for > 3 months prior to seeking treatment at the wound center Timing: she thought it first occurred when she was using a heating pad about a year ago after back surgery. Context: The wound appeared gradually over time Modifying Factors: Patient is currently on renal dialysis and receives treatments 3 times weekly Associated Signs and Symptoms: Patient reports having: surgery scheduled for this week for a AV fistula left arm. 60 year old patient who is known to be a diabetic and has end-stage renal disease has had several comorbidities including coronary artery disease, hypertension, hyperlipidemia, pancreatitis, anemia, previous history of hysterectomy, cholecystectomy, left-sided salivary gland excision, bilateral cataract surgery,Peritoneal dialysis catheter, hemodialysis catheter. the area on the back has also been caused by instant pressure she used to sleep on a recliner all day and has significant kyphoscoliosis. As far as the wound on her right lower extremity she's not sure how this blister occurred but she  thought it has been there for about 2 weeks. No recent blood investigations available and no recent hemoglobin A1c. 12/30/2014 -- she is an assisted living facility but I believe the nurses that have not followed instructions as she had some cream applied on her back and there was a different dressing. Last week she's had a AV fistula placed on her left forearm. 01/13/2015 -- she has had some localized infection at the port site and she's been on doxycycline for this. 02/05/2015 - he has developed a small blister on her right lower  extremity. 02/10/2015 -- she has developed another small blister on her right anterior chest wall in the area where she's had tape for her dialysis access. this may just be injury caused by a tape burn. [name], [name] ([id number]) 02/18/2015 -- no new blisters and she had a dermatology opinion and they have taken a biopsy of her skin. She also had a left brachial AV fistula placed this week. 03/03/2015 -- though we do not have the pathology report yet the patient says she has been put on prednisone because this skin disease is possibly to do with her immune system and her dermatologist is recommended this. 03/10/2015 -- she has developed a new blister which is quite large on her right lower extremity on the shin. 05/22/2015 -- she did very well with her dressing but on trying to remove the [employer] it peels away the newly healed skin. 06/05/2015 -- the patient was in the ER this past week for severe bleeding from the right nostril and had to have ENT see her and do a packing. They've also been holding her heparin during her dialysis. The patient is going back to ENT today. Other than that she has had no other significant issues. 06/19/2015 -- she is back on her blood thinners and continues to have her hemodialysis as before. 07/31/2015 -- a large blister has popped up again on her right lower extremity in the same place in the mid shin area in the anterior  lateral compartment. 10/05/2015 -- last week she was admitted to the hospital for 4 days as she had a pneumonia and was treated with IV and oral antibiotics. 04/22/16: f/u for right upper back wound. no systemic s/s of infection. no new wounds or skin breakdown reported. 04/29/16: returns today for f/u. no new wounds or skin breakdown. no systemic s/s of infection. no interval changes regarding health status. 05/27/2016 -- she continues to come to see [place] for palliative care or complex care as the problem almost heals and then the blister reopens. Objective Constitutional Pulse regular. Respirations normal and unlabored. Afebrile. Vitals Time Taken: 9:40 AM, Height: 65 in, Weight: 156 lbs, BMI: 26, Temperature: 98.0 F, Pulse: 83 bpm, Respiratory Rate: 18 breaths/min, Blood Pressure: 197/74 mmHg. Eyes Nonicteric. Reactive to light. Ears, Nose, Mouth, and Throat Lips, teeth, and gums WNL.Marland Kitchen Moist mucosa without lesions. Neck supple and nontender. No palpable supraclavicular or cervical adenopathy. Normal sized without goiter. [name] ([id number]) Respiratory WNL. No retractions.. Cardiovascular Pedal Pulses WNL. No clubbing, cyanosis or edema. Lymphatic No adneopathy. No adenopathy. No adenopathy. Musculoskeletal Adexa without tenderness or enlargement.. Digits and nails w/o clubbing, cyanosis, infection, petechiae, ischemia, or inflammatory conditions.Marland Kitchen Psychiatric Judgement and insight Intact.. No evidence of depression, anxiety, or agitation.. General Notes: the scar tissue is very supple and well healed except for a small area in the center of the wound where 2 areas have not epithelialized yet. no debridement was required today. Integumentary (Hair, Skin) No suspicious lesions. No crepitus or fluctuance. No peri-wound warmth or erythema. No masses.. Wound #1 status is Open. Original cause of wound was Blister. The wound is located on the Right Back. The wound measures  0.1cm length x 0.1cm width x 0.1cm depth; 0.008cm^2 area and 0.001cm^3 volume. The wound is limited to skin breakdown. There is no tunneling or undermining noted. There is a small amount of sanguinous drainage noted. The wound margin is flat and intact. There is large (67-100%) red, friable granulation within the wound bed. There is  no necrotic tissue within the wound bed. The periwound skin appearance exhibited: Scarring, Moist, Ecchymosis. The periwound skin appearance did not exhibit: Callus, Crepitus, Excoriation, Fluctuance, Friable, Induration, Localized Edema, Rash, Dry/Scaly, Maceration, Atrophie Blanche, Cyanosis, Hemosiderin Staining, Mottled, Pallor, Rubor, Erythema. Periwound temperature was noted as No Abnormality. Assessment Active Problems ICD-10 E11.622 - Type 2 diabetes mellitus with other skin ulcer L89.112 - Pressure ulcer of right upper back, stage 2 S80.821A - Blister (nonthermal), right lower leg, initial encounter N18.6 - End stage renal disease Dalesandro, Abbigal S. ([id number]) Plan Wound Cleansing: Wound #1 Right Back: Clean wound with Normal Saline. May Shower, gently pat wound dry prior to applying new dressing. - PATIENT MAY SHOWER AND [place] WITH SOAP AND WATER WITH CNA ASSISTANCE AND PAT WOUND DRY AND SNF NURSE TO APPLY NEW DRESSING RIGHT AFTERWARDS Skin Barriers/Peri-Wound Care: Wound #1 Right Back: Skin Prep Primary Wound Dressing: Wound #1 Right Back: Other: - Tritec Silver Ultra Foam - wet prior to removing; Cover with [facility] Dressing Dressing Change Frequency: Wound #1 Right Back: Other: - Change when soiled Follow-up Appointments: Wound #1 Right Back: Return Appointment in 1 week. In view of the fragile skin I have been using Tritec Ultra silver foam, and she has done very well with this material. Last week she's had an excellent result with this. Electronic Signature(s) Signed: 05/27/2016 10:12:44 AM By: [name], [name] Entered  By: [name] on 05/27/2016 10:12:44 Bahe, [name] ([id number]) -------------------------------------------------------------------------------- SuperBill Details Patient Name: LANEE, MAZZIO S. Date of Service: 05/27/2016 Medical Record Number: [id number] Patient Account Number: [id number] Date of Birth/Sex: 05-27-1956 (60 y.o. Female) Treating RN: Montey Hora Primary Care Physician: [name] Other Clinician: Referring Physician: [name] Treating Physician/Extender: Frann Rider in Treatment: 74 Diagnosis Coding ICD-10 Codes Code Description E11.622 Type 2 diabetes mellitus with other skin ulcer L89.112 Pressure ulcer of right upper back, stage 2 S80.821A Blister (nonthermal), right lower leg, initial encounter N18.6 End stage renal disease Facility Procedures CPT4 Code: [id number] Description: [phone] - WOUND CARE VISIT-LEV 2 EST PT Modifier: Quantity: 1 Physician Procedures CPT4 Code: [id number] Description: [id number] - WC PHYS LEVEL 3 - EST PT ICD-10 Description Diagnosis E11.622 Type 2 diabetes mellitus with other skin ulcer L89.112 Pressure ulcer of right upper back, stage 2 N18.6 End stage renal disease S80.821A Blister (nonthermal), right lower  leg, initial enc Modifier: ounter Quantity: 1 Electronic Signature(s) Signed: 05/27/2016 10:12:58 AM By: [name], [name] Entered By: [name] on 05/27/2016 10:12:57

## 2016-05-30 NOTE — Progress Notes (Signed)
[name], [name] ([id number]) Visit Report for 05/27/2016 Arrival Information Details [name], ELTZ. Date of Service: 05/27/2016 9:15 AM Medical Record Number: [id number] Patient Account Number: [id number] Date of Birth/Sex: Dec 02, 1955 (60 y.o. Female) Treating RN: Montey Hora Primary Care Physician: [name] Other Clinician: Referring Physician: [name] Treating Physician/Extender: Frann Rider in Treatment: 53 Visit Information History Since Last Visit Added or deleted any medications: No Patient Arrived: Walker Any new allergies or adverse reactions: No Arrival Time: 09:38 Had a fall or experienced change in No Accompanied By: self activities of daily living that may affect Transfer Assistance: None risk of falls: Patient Identification Verified: Yes Signs or symptoms of abuse/neglect since last No Secondary Verification Process Yes visito Completed: Hospitalized since last visit: No Patient Requires Transmission- No Pain Present Now: No Based Precautions: Patient Has Alerts: Yes Patient Alerts: Patient on Blood Thinner Electronic Signature(s) Signed: 05/30/2016 4:15:03 PM By: Montey Hora Entered By: Montey Hora on 05/27/2016 09:40:07 [place], [name] ([id number]) -------------------------------------------------------------------------------- Clinic Level of Care Assessment Details [name]. Date of Service: 05/27/2016 9:15 AM Medical Record Number: [id number] Patient Account Number: [id number] Date of Birth/Sex: 1955-10-23 (60 y.o. Female) Treating RN: Montey Hora Primary Care Physician: [name] Other Clinician: Referring Physician: [name] Treating Physician/Extender: Frann Rider in Treatment: 10 Clinic Level of Care Assessment Items TOOL 4 Quantity Score []  - Use when only an EandM is performed on FOLLOW-UP visit 0 ASSESSMENTS - Nursing Assessment / Reassessment X - Reassessment  of Co-morbidities (includes updates in patient status) 1 10 X - Reassessment of Adherence to Treatment Plan 1 5 ASSESSMENTS - Wound and Skin Assessment / Reassessment X - Simple Wound Assessment / Reassessment - one wound 1 5 []  - Complex Wound Assessment / Reassessment - multiple wounds 0 []  - Dermatologic / Skin Assessment (not related to wound area) 0 ASSESSMENTS - Focused Assessment []  - Circumferential Edema Measurements - multi extremities 0 []  - Nutritional Assessment / Counseling / Intervention 0 []  - Lower Extremity Assessment (monofilament, tuning fork, pulses) 0 []  - Peripheral Arterial Disease Assessment (using hand held doppler) 0 ASSESSMENTS - Ostomy and/or Continence Assessment and Care []  - Incontinence Assessment and Management 0 []  - Ostomy Care Assessment and Management (repouching, etc.) 0 PROCESS - Coordination of Care X - Simple Patient / Family Education for ongoing care 1 15 []  - Complex (extensive) Patient / Family Education for ongoing care 0 []  - Staff obtains Programmer, systems, Records, Test Results / Process Orders 0 []  - Staff telephones HHA, Nursing Homes / Clarify orders / etc 0 []  - Routine Transfer to another Facility (non-emergent condition) 0 [name], [name] ([id number]) []  - Routine Hospital Admission (non-emergent condition) 0 []  - New Admissions / Biomedical engineer / Ordering NPWT, Apligraf, etc. 0 []  - Emergency Hospital Admission (emergent condition) 0 X - Simple Discharge Coordination 1 10 []  - Complex (extensive) Discharge Coordination 0 PROCESS - Special Needs []  - Pediatric / Minor Patient Management 0 []  - Isolation Patient Management 0 []  - Hearing / Language / Visual special needs 0 []  - Assessment of Community assistance (transportation, D/C planning, etc.) 0 []  - Additional assistance / Altered mentation 0 []  - Support Surface(s) Assessment (bed, cushion, seat, etc.) 0 INTERVENTIONS - Wound Cleansing / Measurement X - Simple Wound  Cleansing - one wound 1 5 []  - Complex Wound Cleansing - multiple wounds 0 X - Wound Imaging (photographs - any number of wounds) 1 5 []  - Wound  Tracing (instead of photographs) 0 X - Simple Wound Measurement - one wound 1 5 []  - Complex Wound Measurement - multiple wounds 0 INTERVENTIONS - Wound Dressings X - Small Wound Dressing one or multiple wounds 1 10 []  - Medium Wound Dressing one or multiple wounds 0 []  - Large Wound Dressing one or multiple wounds 0 []  - Application of Medications - topical 0 []  - Application of Medications - injection 0 INTERVENTIONS - Miscellaneous []  - External ear exam 0 Graffeo, [name] ([id number]) []  - Specimen Collection (cultures, biopsies, blood, body fluids, etc.) 0 []  - Specimen(s) / Culture(s) sent or taken to Lab for analysis 0 []  - Patient Transfer (multiple staff / Harrel Lemon Lift / Similar devices) 0 []  - Simple Staple / Suture removal (25 or less) 0 []  - Complex Staple / Suture removal (26 or more) 0 []  - Hypo / Hyperglycemic Management (close monitor of Blood Glucose) 0 []  - Ankle / Brachial Index (ABI) - do not check if billed separately 0 X - Vital Signs 1 5 Has the patient been seen at the hospital within the last three years: Yes Total Score: 75 Level Of Care: New/Established - Level 2 Electronic Signature(s) Signed: 05/30/2016 4:15:03 PM By: Montey Hora Entered By: Montey Hora on 05/27/2016 10:07:37 [name], [name] ([id number]) -------------------------------------------------------------------------------- Encounter Discharge Information Details [name], [name] Date of Service: 05/27/2016 9:15 AM Medical Record Number: [id number] Patient Account Number: [id number] Date of Birth/Sex: 1955/07/21 (60 y.o. Female) Treating RN: Montey Hora Primary Care Physician: [name] Other Clinician: Referring Physician: [name] Treating Physician/Extender: Frann Rider in Treatment: 74 Encounter Discharge  Information Items Discharge Pain Level: 0 Discharge Condition: Stable Ambulatory Status: Walker Discharge Destination: Home Transportation: Private Auto Accompanied By: self Schedule Follow-up Appointment: Yes Medication Reconciliation completed and provided to Patient/Care No Lonie Newsham: Provided on Clinical Summary of Care: 05/27/2016 Form Type Recipient Paper Patient LD Electronic Signature(s) Signed: 05/27/2016 10:08:52 AM By: Ruthine Dose Entered By: Ruthine Dose on 05/27/2016 10:08:52 [place], [name] ([id number]) -------------------------------------------------------------------------------- Multi Wound Chart Details [name], [name] Date of Service: 05/27/2016 9:15 AM Medical Record Number: [id number] Patient Account Number: [id number] Date of Birth/Sex: 10/08/1955 (60 y.o. Female) Treating RN: Montey Hora Primary Care Physician: [name] Other Clinician: Referring Physician: [name] Treating Physician/Extender: Frann Rider in Treatment: 32 Vital Signs Height(in): 65 Pulse(bpm): 83 Weight(lbs): 156 Blood Pressure 197/74 (mmHg): Body Mass Index(BMI): 26 Temperature(F): 98.0 Respiratory Rate 18 (breaths/min): Photos: [N/A:N/A] Wound Location: Right Back N/A N/A Wounding Event: Blister N/A N/A Primary Etiology: Pressure Ulcer N/A N/A Comorbid History: Glaucoma, Anemia, N/A N/A Coronary Artery Disease, Hypertension, Type II Diabetes, End Stage Renal Disease, Osteoarthritis, Neuropathy Date Acquired: 10/19/2013 N/A N/A Weeks of Treatment: 74 N/A N/A Wound Status: Open N/A N/A Measurements L x W x D 0.1x0.1x0.1 N/A N/A (cm) Area (cm) : 0.008 N/A N/A Volume (cm) : 0.001 N/A N/A % Reduction in Area: 100.00% N/A N/A % Reduction in Volume: 100.00% N/A N/A Classification: Category/Stage II N/A N/A Exudate Amount: Small N/A N/A Exudate Type: Sanguinous N/A N/A Exudate Color: red N/A N/A Wound Margin: Flat and Intact N/A  N/A [place], [name] ([id number]) Granulation Amount: Large (67-100%) N/A N/A Granulation Quality: Red, Friable N/A N/A Necrotic Amount: None Present (0%) N/A N/A Exposed Structures: Fascia: No N/A N/A Fat: No Tendon: No Muscle: No Joint: No Bone: No Limited to Skin Breakdown Epithelialization: Medium (34-66%) N/A N/A Periwound Skin Texture: Scarring: Yes N/A N/A Edema: No Excoriation:  No Induration: No Callus: No Crepitus: No Fluctuance: No Friable: No Rash: No Periwound Skin Moist: Yes N/A N/A Moisture: Maceration: No Dry/Scaly: No Periwound Skin Color: Ecchymosis: Yes N/A N/A Atrophie Blanche: No Cyanosis: No Erythema: No Hemosiderin Staining: No Mottled: No Pallor: No Rubor: No Temperature: No Abnormality N/A N/A Tenderness on No N/A N/A Palpation: Wound Preparation: Ulcer Cleansing: N/A N/A Rinsed/Irrigated with Saline Topical Anesthetic Applied: None Treatment Notes Electronic Signature(s) Signed: 05/30/2016 4:15:03 PM By: Montey Hora Entered By: Montey Hora on 05/27/2016 09:50:22 [place], [name] ([id number]) -------------------------------------------------------------------------------- [place] Details [name]. Date of Service: 05/27/2016 9:15 AM Medical Record Number: [id number] Patient Account Number: [id number] Date of Birth/Sex: Feb 09, 1956 (60 y.o. Female) Treating RN: Montey Hora Primary Care Physician: [name] Other Clinician: Referring Physician: [name] Treating Physician/Extender: Frann Rider in Treatment: 71 Active Inactive Abuse / Safety / Falls / Self Care Management Nursing Diagnoses: Potential for falls Goals: Patient will remain injury free Date Initiated: 12/23/2014 Goal Status: Active Patient/caregiver will verbalize understanding of skin care regimen Date Initiated: 12/23/2014 Goal Status: Active Patient/caregiver will verbalize/demonstrate measures taken to  prevent injury and/or falls Date Initiated: 12/23/2014 Goal Status: Active Patient/caregiver will verbalize/demonstrate understanding of what to do in case of emergency Date Initiated: 12/23/2014 Goal Status: Active Interventions: Assess fall risk on admission and as needed Assess: immobility, friction, shearing, incontinence upon admission and as needed Assess impairment of mobility on admission and as needed per policy Assess self care needs on admission and as needed Provide education on fall prevention Notes: Wound/Skin Impairment Nursing Diagnoses: Impaired tissue integrity Knowledge deficit related to ulceration/compromised skin integrity Goals: Patient/caregiver will verbalize understanding of skin care regimen ALGA, NISSAN ([id number]) Date Initiated: 12/23/2014 Goal Status: Active Ulcer/skin breakdown will heal within 14 weeks Date Initiated: 12/23/2014 Goal Status: Active Interventions: Assess patient/caregiver ability to obtain necessary supplies Assess patient/caregiver ability to perform ulcer/skin care regimen upon admission and as needed Assess ulceration(s) every visit Provide education on ulcer and skin care Treatment Activities: Skin care regimen initiated : 11/05/2015 Topical wound management initiated : 11/05/2015 Notes: Electronic Signature(s) Signed: 05/30/2016 4:15:03 PM By: Montey Hora Entered By: Montey Hora on 05/27/2016 09:50:14 [place], [name] ([id number]) -------------------------------------------------------------------------------- Pain Assessment Details [name]. Date of Service: 05/27/2016 9:15 AM Medical Record Number: [id number] Patient Account Number: [id number] Date of Birth/Sex: 03-Jun-1956 (60 y.o. Female) Treating RN: Montey Hora Primary Care Physician: [name] Other Clinician: Referring Physician: [name] Treating Physician/Extender: Frann Rider in Treatment: 74 Active Problems Location  of Pain Severity and Description of Pain Patient Has Paino No Site Locations Pain Management and Medication Current Pain Management: Notes Topical or injectable lidocaine is offered to patient for acute pain when surgical debridement is performed. If needed, Patient is instructed to use over the counter pain medication for the following 24-48 hours after debridement. Wound care MDs do not prescribed pain medications. Patient has chronic pain or uncontrolled pain. Patient has been instructed to make an appointment with their Primary Care Physician for pain management. Electronic Signature(s) Signed: 05/30/2016 4:15:03 PM By: Montey Hora Entered By: Montey Hora on 05/27/2016 09:40:20 [place], [name] ([id number]) -------------------------------------------------------------------------------- Patient/Caregiver Education Details [name]. Date of Service: 05/27/2016 9:15 AM Medical Record Number: [id number] Patient Account Number: [id number] Date of Birth/Gender: 07/22/1955 (60 y.o. Female) Treating RN: Montey Hora Primary Care Physician: [name] Other Clinician: Referring Physician: [name] Treating Physician/Extender: Frann Rider in Treatment: 69 Education  Assessment Education Provided To: Patient Education Topics Provided Wound/Skin Impairment: Handouts: Other: continue protection with showers Methods: Demonstration, Explain/Verbal Responses: State content correctly Electronic Signature(s) Signed: 05/30/2016 4:15:03 PM By: Montey Hora Entered By: Montey Hora on 05/27/2016 10:08:39 [name], [name] ([id number]) -------------------------------------------------------------------------------- Wound Assessment Details Patient Name: Goris, [name] Date of Service: 05/27/2016 9:15 AM Medical Record Number: [id number] Patient Account Number: [id number] Date of Birth/Sex: 1956-06-09 (60 y.o. Female) Treating RN: Montey Hora Primary Care Physician: [name] Other Clinician: Referring Physician: [name] Treating Physician/Extender: Frann Rider in Treatment: 74 Wound Status Wound Number: 1 Primary Pressure Ulcer Etiology: Wound Location: Right Back Wound Open Wounding Event: Blister Status: Date Acquired: 10/19/2013 Comorbid Glaucoma, Anemia, Coronary Artery Weeks Of Treatment: 74 History: Disease, Hypertension, Type II Clustered Wound: No Diabetes, End Stage Renal Disease, Osteoarthritis, Neuropathy Photos Wound Measurements Length: (cm) 0.1 Width: (cm) 0.1 Depth: (cm) 0.1 Area: (cm) 0.008 Volume: (cm) 0.001 % Reduction in Area: 100% % Reduction in Volume: 100% Epithelialization: Medium (34-66%) Tunneling: No Undermining: No Wound Description Classification: Category/Stage II Wound Margin: Flat and Intact Exudate Amount: Small Exudate Type: Sanguinous Exudate Color: red Foul Odor After Cleansing: No Wound Bed Granulation Amount: Large (67-100%) Exposed Structure Granulation Quality: Red, Friable Fascia Exposed: No Necrotic Amount: None Present (0%) Fat Layer Exposed: No Tendon Exposed: No Siz[place]ore, [name] ([id number]) Muscle Exposed: No Joint Exposed: No Bone Exposed: No Limited to Skin Breakdown Periwound Skin Texture Texture Color No Abnormalities Noted: No No Abnormalities Noted: No Callus: No Atrophie Blanche: No Crepitus: No Cyanosis: No Excoriation: No Ecchymosis: Yes Fluctuance: No Erythema: No Friable: No Hemosiderin Staining: No Induration: No Mottled: No Localized Edema: No Pallor: No Rash: No Rubor: No Scarring: Yes Temperature / Pain Moisture Temperature: No Abnormality No Abnormalities Noted: No Dry / Scaly: No Maceration: No Moist: Yes Wound Preparation Ulcer Cleansing: Rinsed/Irrigated with Saline Topical Anesthetic Applied: None Treatment Notes Wound #1 (Right Back) 1. Cleansed with: Clean wound with Normal  Saline 3. Peri-wound Care: Skin Prep 4. Dressing Applied: Other dressing (specify in notes) 5. Secondary [place] Notes tritec silver foam Electronic Signature(s) Signed: 05/30/2016 4:15:03 PM By: Montey Hora Entered By: Montey Hora on 05/27/2016 09:48:19 [place], [name] ([id number]) -------------------------------------------------------------------------------- Vitals Details [name]. Date of Service: 05/27/2016 9:15 AM Medical Record Number: [id number] Patient Account Number: [id number] Date of Birth/Sex: Mar 09, 1956 (60 y.o. Female) Treating RN: Montey Hora Primary Care Physician: [name] Other Clinician: Referring Physician: [name] Treating Physician/Extender: Frann Rider in Treatment: 69 Vital Signs Time Taken: 09:40 Temperature (F): 98.0 Height (in): 65 Pulse (bpm): 83 Weight (lbs): 156 Respiratory Rate (breaths/min): 18 Body Mass Index (BMI): 26 Blood Pressure (mmHg): 197/74 Reference Range: 80 - 120 mg / dl Electronic Signature(s) Signed: 05/30/2016 4:15:03 PM By: Montey Hora Entered By: Montey Hora on 05/27/2016 09:40:49

## 2016-06-13 ENCOUNTER — Encounter: Payer: Medicare Other | Admitting: Surgery

## 2016-06-13 DIAGNOSIS — E11622 Type 2 diabetes mellitus with other skin ulcer: Secondary | ICD-10-CM | POA: Diagnosis not present

## 2016-06-14 NOTE — Progress Notes (Signed)
[name], [name] ([id number]) Visit Report for 06/13/2016 Chief Complaint Document Details [name], [name] 06/13/2016 10:00 Date of Service: AM Medical Record [id number] Number: Patient Account Number: [id number] [birth year] (60 y.o. Treating RN: Montey Hora Date of Birth/Sex: Female) Other Clinician: Primary Care Physician: [name] Treating Shanecia Hoganson Referring Physician: [name] Physician/Extender: [name]: 76 Information Obtained from: Patient Chief Complaint Patient presents to the wound care center for a consult due non healing wound. She has an open wound on her right upper back which she's had for about a year and she recently noticed a blister on her right lower extremity about 2 weeks ago. Electronic Signature(s) Signed: 06/13/2016 5:19:06 PM By: Gretta Cool RN, [name], Kim RN, [name] Signed: 06/14/2016 10:22:59 AM By: [name], [name] Previous Signature: 06/13/2016 10:44:16 AM Version By: [name], [name] Entered By: Gretta Cool RN, [name], Kim on 06/13/2016 17:17:52 [name], [name] ([id number]) -------------------------------------------------------------------------------- HPI Details [name], [name] 06/13/2016 10:00 Date of Service: AM Medical Record [id number] Number: Patient Account Number: [id number] 02/06/1956 (60 y.o. Treating RN: Montey Hora Date of Birth/Sex: Female) Other Clinician: Primary Care Physician: [name] Treating Margaux Engen Referring Physician: [name] Physician/Extender: [name]: 76 History of Present Illness Location: right upper back and right lower extremity wounds Quality: Patient reports No Pain. Severity: Patient states wound (s) are getting better. Duration: Patient has had the wound for > 3 months prior to seeking treatment at the wound center Timing: she thought it first occurred when she was using a heating pad about a year ago after  back surgery. Context: The wound appeared gradually over time Modifying Factors: Patient is currently on renal dialysis and receives treatments 3 times weekly Associated Signs and Symptoms: Patient reports having: surgery scheduled for this week for a AV fistula left arm. HPI Description: 60 year old patient who is known to be a diabetic and has end-stage renal disease has had several comorbidities including coronary artery disease, hypertension, hyperlipidemia, pancreatitis, anemia, previous history of hysterectomy, cholecystectomy, left-sided salivary gland excision, bilateral cataract surgery,Peritoneal dialysis catheter, hemodialysis catheter. the area on the back has also been caused by instant pressure she used to sleep on a recliner all day and has significant kyphoscoliosis. As far as the wound on her right lower extremity she's not sure how this blister occurred but she thought it has been there for about 2 weeks. No recent blood investigations available and no recent hemoglobin A1c. 12/30/2014 -- she is an assisted living facility but I believe the nurses that have not followed instructions as she had some cream applied on her back and there was a different dressing. Last week she's had a AV fistula placed on her left forearm. 01/13/2015 -- she has had some localized infection at the port site and she's been on doxycycline for this. 02/05/2015 - he has developed a small blister on her right lower extremity. 02/10/2015 -- she has developed another small blister on her right anterior chest wall in the area where she's had tape for her dialysis access. this may just be injury caused by a tape burn. 02/18/2015 -- no new blisters and she had a dermatology opinion and they have taken a biopsy of her skin. She also had a left brachial AV fistula placed this week. 03/03/2015 -- though we do not have the pathology report yet the patient says she has been put on prednisone because this skin  disease is possibly to do with her immune system and her  dermatologist is recommended this. 03/10/2015 -- she has developed a new blister which is quite large on her right lower extremity on the shin. 05/22/2015 -- she did very well with her dressing but on trying to remove the [place] it peels away Delfino, Shavaun S. ([id number]) the newly healed skin. 06/05/2015 -- the patient was in the ER this past week for severe bleeding from the right nostril and had to have ENT see her and do a packing. They've also been holding her heparin during her dialysis. The patient is going back to ENT today. Other than that she has had no other significant issues. 06/19/2015 -- she is back on her blood thinners and continues to have her hemodialysis as before. 07/31/2015 -- a large blister has popped up again on her right lower extremity in the same place in the mid shin area in the anterior lateral compartment. 10/05/2015 -- last week she was admitted to the hospital for 4 days as she had a pneumonia and was treated with IV and oral antibiotics. 04/22/16: f/u for right upper back wound. no systemic s/s of infection. no new wounds or skin breakdown reported. 04/29/16: returns today for f/u. no new wounds or skin breakdown. no systemic s/s of infection. no interval changes regarding health status. 05/27/2016 -- she continues to come to see [place] for palliative care or complex care as the problem almost heals and then the blister reopens. Electronic Signature(s) Signed: 06/13/2016 5:19:06 PM By: Gretta Cool RN, [name], Kim RN, [name] Signed: 06/14/2016 10:22:59 AM By: [name], [name] Previous Signature: 06/13/2016 10:44:21 AM Version By: [name], [name] Entered By: Gretta Cool RN, [name], Kim on 06/13/2016 17:18:06 [name], [name] ([id number]) -------------------------------------------------------------------------------- Physical Exam Details [name], [name] 06/13/2016 10:00 Date of  Service: AM Medical Record [id number] Number: Patient Account Number: [id number] 06-22-1956 (60 y.o. Treating RN: Montey Hora Date of Birth/Sex: Female) Other Clinician: Primary Care Physician: [name] Treating Ellijah Leffel Referring Physician: [name] Physician/Extender: [name]: 76 Constitutional . Pulse regular. Respirations normal and unlabored. Afebrile. . Eyes Nonicteric. Reactive to light. Ears, Nose, Mouth, and Throat Lips, teeth, and gums WNL.Marland Kitchen Moist mucosa without lesions. Neck supple and nontender. No palpable supraclavicular or cervical adenopathy. Normal sized without goiter. Respiratory WNL. No retractions.. Cardiovascular Pedal Pulses WNL. No clubbing, cyanosis or edema. Lymphatic No adneopathy. No adenopathy. No adenopathy. Musculoskeletal Adexa without tenderness or enlargement.. Digits and nails w/o clubbing, cyanosis, infection, petechiae, ischemia, or inflammatory conditions.. Integumentary (Hair, Skin) No suspicious lesions. No crepitus or fluctuance. No peri-wound warmth or erythema. No masses.Marland Kitchen Psychiatric Judgement and insight Intact.. No evidence of depression, anxiety, or agitation.. Notes the wound is the best I have seen in a long while with few small open areas and the rest of it seems to have supple scar. Electronic Signature(s) Signed: 06/13/2016 5:19:06 PM By: Gretta Cool RN, [name], Kim RN, [name] Signed: 06/14/2016 10:22:59 AM By: [name], [name] Previous Signature: 06/13/2016 10:44:48 AM Version By: [name], [name] Entered By: Gretta Cool RN, [name], Kim on 06/13/2016 17:18:14 [place], [name] ([id number]) -------------------------------------------------------------------------------- Physician Orders Details [name], WALSHE 06/13/2016 10:00 Date of Service: AM Medical Record [id number] Number: Patient Account Number: [id number] 12/22/1955 (60 y.o. Treating RN: Montey Hora Date of  Birth/Sex: Female) Other Clinician: Primary Care Physician: [name] Treating Ladena Jacquez Referring Physician: [name] Physician/Extender: [name]: 69 Verbal / Phone Orders: Yes Clinician: Montey Hora Read Back and Verified: Yes Diagnosis Coding Wound Cleansing  Wound #1 Right Back o Clean wound with Normal Saline. o May Shower, gently pat wound dry prior to applying new dressing. - PATIENT MAY SHOWER AND [place] WOUND WITH SOAP AND WATER WITH CNA ASSISTANCE AND PAT WOUND DRY AND SNF NURSE TO APPLY NEW DRESSING RIGHT AFTERWARDS Skin Barriers/Peri-Wound Care Wound #1 Right Back o Skin Prep Primary Wound Dressing Wound #1 Right Back o Other: - Tritec Silver Ultra Foam - wet prior to removing; Cover with [facility] Dressing Dressing Change Frequency Wound #1 Right Back o Other: - Change when soiled Follow-up Appointments Wound #1 Right Back o Return Appointment in 2 weeks. Electronic Signature(s) Signed: 06/13/2016 4:38:31 PM By: [name], [name] Signed: 06/13/2016 5:14:32 PM By: Montey Hora Entered By: Montey Hora on 06/13/2016 10:58:54 [name], [name] ([id number]) -------------------------------------------------------------------------------- Problem List Details Patient Name: ANNAVICTORIA, CAPURRO 06/13/2016 10:00 Date of Service: AM Medical Record [id number] Number: Patient Account Number: [id number] 03-29-1956 (60 y.o. Treating RN: Montey Hora Date of Birth/Sex: Female) Other Clinician: Primary Care Physician: [name] Treating Jimmi Sidener Referring Physician: [name] Physician/Extender: [name]: 76 Active Problems ICD-10 Encounter Code Description Active Date Diagnosis E11.622 Type 2 diabetes mellitus with other skin ulcer 12/23/2014 Yes L89.112 Pressure ulcer of right upper back, stage 2 12/23/2014 Yes S80.821A Blister (nonthermal), right lower leg, initial encounter 12/23/2014 Yes N18.6 End  stage renal disease 12/23/2014 Yes Inactive Problems Resolved Problems Electronic Signature(s) Signed: 06/13/2016 5:19:06 PM By: Gretta Cool RN, [name], Kim RN, [name] Signed: 06/14/2016 10:22:59 AM By: [name], [name] Previous Signature: 06/13/2016 10:44:10 AM Version By: [name], [name] Entered By: Gretta Cool RN, [name], Kim on 06/13/2016 17:17:43 [name], [name] ([id number]) -------------------------------------------------------------------------------- Progress Note Details [name], [name] 06/13/2016 10:00 Date of Service: AM Medical Record [id number] Number: Patient Account Number: [id number] [birth year] (60 y.o. Treating RN: Montey Hora Date of Birth/Sex: Female) Other Clinician: Primary Care Physician: [name] Treating Zygmunt Mcglinn Referring Physician: [name] Physician/Extender: [name]: 76 Subjective Chief Complaint Information obtained from Patient Patient presents to the wound care center for a consult due non healing wound. She has an open wound on her right upper back which she's had for about a year and she recently noticed a blister on her right lower extremity about 2 weeks ago. History of Present Illness (HPI) The following HPI elements were documented for the patient's wound: Location: right upper back and right lower extremity wounds Quality: Patient reports No Pain. Severity: Patient states wound (s) are getting better. Duration: Patient has had the wound for > 3 months prior to seeking treatment at the wound center Timing: she thought it first occurred when she was using a heating pad about a year ago after back surgery. Context: The wound appeared gradually over time Modifying Factors: Patient is currently on renal dialysis and receives treatments 3 times weekly Associated Signs and Symptoms: Patient reports having: surgery scheduled for this week for a AV fistula left arm. 60 year old patient who is known to be a  diabetic and has end-stage renal disease has had several comorbidities including coronary artery disease, hypertension, hyperlipidemia, pancreatitis, anemia, previous history of hysterectomy, cholecystectomy, left-sided salivary gland excision, bilateral cataract surgery,Peritoneal dialysis catheter, hemodialysis catheter. the area on the back has also been caused by instant pressure she used to sleep on a recliner all day and has significant kyphoscoliosis. As far as the wound on her right lower extremity she's not sure how this blister occurred but she thought it has been  there for about 2 weeks. No recent blood investigations available and no recent hemoglobin A1c. 12/30/2014 -- she is an assisted living facility but I believe the nurses that have not followed instructions as she had some cream applied on her back and there was a different dressing. Last week she's had a AV fistula placed on her left forearm. 01/13/2015 -- she has had some localized infection at the port site and she's been on doxycycline for this. 02/05/2015 - he has developed a small blister on her right lower extremity. 02/10/2015 -- she has developed another small blister on her right anterior chest wall in the area where she's had tape for her dialysis access. this may just be injury caused by a tape burn. [name], [name] ([id number]) 02/18/2015 -- no new blisters and she had a dermatology opinion and they have taken a biopsy of her skin. She also had a left brachial AV fistula placed this week. 03/03/2015 -- though we do not have the pathology report yet the patient says she has been put on prednisone because this skin disease is possibly to do with her immune system and her dermatologist is recommended this. 03/10/2015 -- she has developed a new blister which is quite large on her right lower extremity on the shin. 05/22/2015 -- she did very well with her dressing but on trying to remove the [place] it peels  away the newly healed skin. 06/05/2015 -- the patient was in the ER this past week for severe bleeding from the right nostril and had to have ENT see her and do a packing. They've also been holding her heparin during her dialysis. The patient is going back to ENT today. Other than that she has had no other significant issues. 06/19/2015 -- she is back on her blood thinners and continues to have her hemodialysis as before. 07/31/2015 -- a large blister has popped up again on her right lower extremity in the same place in the mid shin area in the anterior lateral compartment. 10/05/2015 -- last week she was admitted to the hospital for 4 days as she had a pneumonia and was treated with IV and oral antibiotics. 04/22/16: f/u for right upper back wound. no systemic s/s of infection. no new wounds or skin breakdown reported. 04/29/16: returns today for f/u. no new wounds or skin breakdown. no systemic s/s of infection. no interval changes regarding health status. 05/27/2016 -- she continues to come to see [place] for palliative care or complex care as the problem almost heals and then the blister reopens. Objective Constitutional Pulse regular. Respirations normal and unlabored. Afebrile. Vitals Time Taken: 10:18 AM, Height: 65 in, Weight: 156 lbs, BMI: 26, Temperature: 98.1 F, Pulse: 82 bpm, Respiratory Rate: 18 breaths/min, Blood Pressure: 195/69 mmHg. Eyes Nonicteric. Reactive to light. Ears, Nose, Mouth, and Throat Lips, teeth, and gums WNL.Marland Kitchen Moist mucosa without lesions. Neck supple and nontender. No palpable supraclavicular or cervical adenopathy. Normal sized without goiter. [name] ([id number]) Respiratory WNL. No retractions.. Cardiovascular Pedal Pulses WNL. No clubbing, cyanosis or edema. Lymphatic No adneopathy. No adenopathy. No adenopathy. Musculoskeletal Adexa without tenderness or enlargement.. Digits and nails w/o clubbing, cyanosis, infection, petechiae, ischemia,  or inflammatory conditions.Marland Kitchen Psychiatric Judgement and insight Intact.. No evidence of depression, anxiety, or agitation.. General Notes: the wound is the best I have seen in a long while with few small open areas and the rest of it seems to have supple scar. Integumentary (Hair, Skin) No suspicious lesions. No crepitus or  fluctuance. No peri-wound warmth or erythema. No masses.. Wound #1 status is Open. Original cause of wound was Blister. The wound is located on the Right Back. The wound measures 0.1cm length x 0.1cm width x 0.1cm depth; 0.008cm^2 area and 0.001cm^3 volume. The wound is limited to skin breakdown. There is no tunneling or undermining noted. There is a small amount of sanguinous drainage noted. The wound margin is flat and intact. There is large (67-100%) red, friable granulation within the wound bed. There is no necrotic tissue within the wound bed. The periwound skin appearance exhibited: Scarring, Moist, Ecchymosis. The periwound skin appearance did not exhibit: Callus, Crepitus, Excoriation, Fluctuance, Friable, Induration, Localized Edema, Rash, Dry/Scaly, Maceration, Atrophie Blanche, Cyanosis, Hemosiderin Staining, Mottled, Pallor, Rubor, Erythema. Periwound temperature was noted as No Abnormality. Assessment Active Problems ICD-10 E11.622 - Type 2 diabetes mellitus with other skin ulcer L89.112 - Pressure ulcer of right upper back, stage 2 S80.821A - Blister (nonthermal), right lower leg, initial encounter N18.6 - End stage renal disease Sanders, Ruchama S. ([id number]) Plan Wound Cleansing: Wound #1 Right Back: Clean wound with Normal Saline. May Shower, gently pat wound dry prior to applying new dressing. - PATIENT MAY SHOWER AND [place] WITH SOAP AND WATER WITH CNA ASSISTANCE AND PAT WOUND DRY AND SNF NURSE TO APPLY NEW DRESSING RIGHT AFTERWARDS Skin Barriers/Peri-Wound Care: Wound #1 Right Back: Skin Prep Primary Wound Dressing: Wound #1 Right  Back: Other: - Tritec Silver Ultra Foam - wet prior to removing; Cover with [facility] Dressing Dressing Change Frequency: Wound #1 Right Back: Other: - Change when soiled Follow-up Appointments: Wound #1 Right Back: Return Appointment in 2 weeks. In view of the fragile skin I have been using Tritec Ultra silver foam, and she has done very well with this material. Last week she's had an excellent result with this. if all goes well I anticipate discharge next week Electronic Signature(s) Signed: 06/13/2016 5:19:06 PM By: Gretta Cool RN, [name], Kim RN, [name] Signed: 06/14/2016 10:22:59 AM By: [name], [name] Previous Signature: 06/13/2016 10:45:15 AM Version By: [name], [name] Entered By: Gretta Cool RN, [name], Kim on 06/13/2016 17:18:43 [place], [name] ([id number]) -------------------------------------------------------------------------------- [place] Details Patient Name: LAYLAH, LANGFELDT. Date of Service: 06/13/2016 Medical Record Number: [id number] Patient Account Number: [id number] Date of Birth/Sex: 08-19-1955 (60 y.o. Female) Treating RN: Montey Hora Primary Care Physician: [name] Other Clinician: Referring Physician: [name] Treating Physician/Extender: Frann Rider in Treatment: 76 Diagnosis Coding ICD-10 Codes Code Description E11.622 Type 2 diabetes mellitus with other skin ulcer L89.112 Pressure ulcer of right upper back, stage 2 S80.821A Blister (nonthermal), right lower leg, initial encounter N18.6 End stage renal disease Facility Procedures CPT4 Code: [id number] Description: [phone] - WOUND CARE VISIT-LEV 2 EST PT Modifier: Quantity: 1 Physician Procedures CPT4 Code: [id number] Description: [id number] - WC PHYS LEVEL 3 - EST PT ICD-10 Description Diagnosis E11.622 Type 2 diabetes mellitus with other skin ulcer L89.112 Pressure ulcer of right upper back, stage 2 S80.821A Blister (nonthermal), right lower leg, initial enc N18.6 End  stage renal  disease Modifier: ounter Quantity: 1 Electronic Signature(s) Signed: 06/13/2016 4:38:31 PM By: [name], [name] Signed: 06/13/2016 5:14:32 PM By: Montey Hora Previous Signature: 06/13/2016 10:45:57 AM Version By: [name], [name] Entered By: Montey Hora on 06/13/2016 10:59:21

## 2016-06-14 NOTE — Progress Notes (Signed)
DEJANA, SEAS ([id number]) Visit Report for 06/13/2016 Arrival Information Details Patient Name: [name], [name]. Date of Service: 06/13/2016 10:00 AM Medical Record Number: [id number] Patient Account Number: [id number] Date of Birth/Sex: 07/19/1955 (60 y.o. Female) Treating RN: Montey Hora Primary Care Physician: [name] Other Clinician: Referring Physician: [name] Treating Physician/Extender: Frann Rider in Treatment: 71 Visit Information History Since Last Visit Added or deleted any medications: No Patient Arrived: Walker Any new allergies or adverse reactions: No Arrival Time: 10:17 Had a fall or experienced change in No Accompanied By: self activities of daily living that may affect Transfer Assistance: None risk of falls: Patient Identification Verified: Yes Signs or symptoms of abuse/neglect since last No Secondary Verification Process Yes visito Completed: Hospitalized since last visit: No Patient Requires Transmission- No Pain Present Now: No Based Precautions: Patient Has Alerts: Yes Patient Alerts: Patient on Blood Thinner Electronic Signature(s) Signed: 06/13/2016 5:14:32 PM By: Montey Hora Entered By: Montey Hora on 06/13/2016 10:53:44 [name], [name] ([id number]) -------------------------------------------------------------------------------- Clinic Level of Care Assessment Details [name]. Date of Service: 06/13/2016 10:00 AM Medical Record Number: [id number] Patient Account Number: [id number] Date of Birth/Sex: March 06, 1956 (60 y.o. Female) Treating RN: Montey Hora Primary Care Physician: [name] Other Clinician: Referring Physician: [name] Treating Physician/Extender: Frann Rider in Treatment: 49 Clinic Level of Care Assessment Items TOOL 4 Quantity Score []  - Use when only an EandM is performed on FOLLOW-UP visit 0 ASSESSMENTS - Nursing Assessment / Reassessment X -  Reassessment of Co-morbidities (includes updates in patient status) 1 10 X - Reassessment of Adherence to Treatment Plan 1 5 ASSESSMENTS - Wound and Skin Assessment / Reassessment X - Simple Wound Assessment / Reassessment - one wound 1 5 []  - Complex Wound Assessment / Reassessment - multiple wounds 0 []  - Dermatologic / Skin Assessment (not related to wound area) 0 ASSESSMENTS - Focused Assessment []  - Circumferential Edema Measurements - multi extremities 0 []  - Nutritional Assessment / Counseling / Intervention 0 []  - Lower Extremity Assessment (monofilament, tuning fork, pulses) 0 []  - Peripheral Arterial Disease Assessment (using hand held doppler) 0 ASSESSMENTS - Ostomy and/or Continence Assessment and Care []  - Incontinence Assessment and Management 0 []  - Ostomy Care Assessment and Management (repouching, etc.) 0 PROCESS - Coordination of Care X - Simple Patient / Family Education for ongoing care 1 15 []  - Complex (extensive) Patient / Family Education for ongoing care 0 []  - Staff obtains Programmer, systems, Records, Test Results / Process Orders 0 []  - Staff telephones HHA, Nursing Homes / Clarify orders / etc 0 []  - Routine Transfer to another Facility (non-emergent condition) 0 [name], [name] ([id number]) []  - Routine Hospital Admission (non-emergent condition) 0 []  - New Admissions / Biomedical engineer / Ordering NPWT, Apligraf, etc. 0 []  - Emergency Hospital Admission (emergent condition) 0 X - Simple Discharge Coordination 1 10 []  - Complex (extensive) Discharge Coordination 0 PROCESS - Special Needs []  - Pediatric / Minor Patient Management 0 []  - Isolation Patient Management 0 []  - Hearing / Language / Visual special needs 0 []  - Assessment of Community assistance (transportation, D/C planning, etc.) 0 []  - Additional assistance / Altered mentation 0 []  - Support Surface(s) Assessment (bed, cushion, seat, etc.) 0 INTERVENTIONS - Wound Cleansing / Measurement X -  Simple Wound Cleansing - one wound 1 5 []  - Complex Wound Cleansing - multiple wounds 0 X - Wound Imaging (photographs - any number of wounds) 1 5 []  - Wound  Tracing (instead of photographs) 0 X - Simple Wound Measurement - one wound 1 5 []  - Complex Wound Measurement - multiple wounds 0 INTERVENTIONS - Wound Dressings X - Small Wound Dressing one or multiple wounds 1 10 []  - Medium Wound Dressing one or multiple wounds 0 []  - Large Wound Dressing one or multiple wounds 0 []  - Application of Medications - topical 0 []  - Application of Medications - injection 0 INTERVENTIONS - Miscellaneous []  - External ear exam 0 [name], [name] ([id number]) []  - Specimen Collection (cultures, biopsies, blood, body fluids, etc.) 0 []  - Specimen(s) / Culture(s) sent or taken to Lab for analysis 0 []  - Patient Transfer (multiple staff / Harrel Lemon Lift / Similar devices) 0 []  - Simple Staple / Suture removal (25 or less) 0 []  - Complex Staple / Suture removal (26 or more) 0 []  - Hypo / Hyperglycemic Management (close monitor of Blood Glucose) 0 []  - Ankle / Brachial Index (ABI) - do not check if billed separately 0 X - Vital Signs 1 5 Has the patient been seen at the hospital within the last three years: Yes Total Score: 75 Level Of Care: New/Established - Level 2 Electronic Signature(s) Signed: 06/13/2016 5:14:32 PM By: Montey Hora Entered By: Montey Hora on 06/13/2016 10:59:06 [name], [name] ([id number]) -------------------------------------------------------------------------------- Encounter Discharge Information Details [name]. Date of Service: 06/13/2016 10:00 AM Medical Record Number: [id number] Patient Account Number: [id number] Date of Birth/Sex: 1956-06-16 (60 y.o. Female) Treating RN: Montey Hora Primary Care Physician: [name] Other Clinician: Referring Physician: [name] Treating Physician/Extender: Frann Rider in Treatment: 3 Encounter  Discharge Information Items Discharge Pain Level: 0 Discharge Condition: Stable Ambulatory Status: Walker Discharge Destination: Home Transportation: Private Auto Accompanied By: self Schedule Follow-up Appointment: Yes Medication Reconciliation completed and provided to Patient/Care No [name]: Provided on Clinical Summary of Care: 06/13/2016 Form Type Recipient Paper Patient LD Electronic Signature(s) Signed: 06/13/2016 5:14:32 PM By: Montey Hora Previous Signature: 06/13/2016 10:39:53 AM Version By: Ruthine Dose Entered By: Montey Hora on 06/13/2016 10:59:33 Prentiss, [name] ([id number]) -------------------------------------------------------------------------------- Multi Wound Chart Details [name]. Date of Service: 06/13/2016 10:00 AM Medical Record Number: [id number] Patient Account Number: [id number] Date of Birth/Sex: [birth year] (60 y.o. Female) Treating RN: Montey Hora Primary Care Physician: [name] Other Clinician: Referring Physician: [name] Treating Physician/Extender: Frann Rider in Treatment: 45 Vital Signs Height(in): 65 Pulse(bpm): 82 Weight(lbs): 156 Blood Pressure 195/69 (mmHg): Body Mass Index(BMI): 26 Temperature(F): 98.1 Respiratory Rate 18 (breaths/min): Photos: [N/A:N/A] Wound Location: Right Back N/A N/A Wounding Event: Blister N/A N/A Primary Etiology: Pressure Ulcer N/A N/A Comorbid History: Glaucoma, Anemia, N/A N/A Coronary Artery Disease, Hypertension, Type II Diabetes, End Stage Renal Disease, Osteoarthritis, Neuropathy Date Acquired: 10/19/2013 N/A N/A Weeks of Treatment: 76 N/A N/A Wound Status: Open N/A N/A Measurements L x W x D 0.1x0.1x0.1 N/A N/A (cm) Area (cm) : 0.008 N/A N/A Volume (cm) : 0.001 N/A N/A % Reduction in Area: 100.00% N/A N/A % Reduction in Volume: 100.00% N/A N/A Classification: Category/Stage II N/A N/A Exudate Amount: Small N/A N/A Exudate Type:  Sanguinous N/A N/A Exudate Color: red N/A N/A Wound Margin: Flat and Intact N/A N/A Cuny, Raelee S. ([id number]) Granulation Amount: Large (67-100%) N/A N/A Granulation Quality: Red, Friable N/A N/A Necrotic Amount: None Present (0%) N/A N/A Exposed Structures: Fascia: No N/A N/A Fat: No Tendon: No Muscle: No Joint: No Bone: No Limited to Skin Breakdown Epithelialization: Large (67-100%) N/A N/A Periwound  Skin Texture: Scarring: Yes N/A N/A Edema: No Excoriation: No Induration: No Callus: No Crepitus: No Fluctuance: No Friable: No Rash: No Periwound Skin Moist: Yes N/A N/A Moisture: Maceration: No Dry/Scaly: No Periwound Skin Color: Ecchymosis: Yes N/A N/A Atrophie Blanche: No Cyanosis: No Erythema: No Hemosiderin Staining: No Mottled: No Pallor: No Rubor: No Temperature: No Abnormality N/A N/A Tenderness on No N/A N/A Palpation: Wound Preparation: Ulcer Cleansing: N/A N/A Rinsed/Irrigated with Saline Topical Anesthetic Applied: None Treatment Notes Wound #1 (Right Back) 1. Cleansed with: Clean wound with Normal Saline 3. Peri-wound Care: Skin Prep 4. Dressing Applied: MAIREN, BOHAN ([id number]) Other dressing (specify in notes) 5. Secondary [place] Notes tritec silver foam Electronic Signature(s) Signed: 06/13/2016 5:14:32 PM By: Montey Hora Entered By: Montey Hora on 06/13/2016 10:58:42 Bart, [name] ([id number]) -------------------------------------------------------------------------------- Multi-Disciplinary Care Plan Details [name], [name]. Date of Service: 06/13/2016 10:00 AM Medical Record Number: [id number] Patient Account Number: [id number] Date of Birth/Sex: [birth year] (60 y.o. Female) Treating RN: Montey Hora Primary Care Physician: [name] Other Clinician: Referring Physician: [name] Treating Physician/Extender: Frann Rider in Treatment: 75 Active Inactive Abuse /  Safety / Falls / Self Care Management Nursing Diagnoses: Potential for falls Goals: Patient will remain injury free Date Initiated: 12/23/2014 Goal Status: Active Patient/caregiver will verbalize understanding of skin care regimen Date Initiated: 12/23/2014 Goal Status: Active Patient/caregiver will verbalize/demonstrate measures taken to prevent injury and/or falls Date Initiated: 12/23/2014 Goal Status: Active Patient/caregiver will verbalize/demonstrate understanding of what to do in case of emergency Date Initiated: 12/23/2014 Goal Status: Active Interventions: Assess fall risk on admission and as needed Assess: immobility, friction, shearing, incontinence upon admission and as needed Assess impairment of mobility on admission and as needed per policy Assess self care needs on admission and as needed Provide education on fall prevention Notes: Wound/Skin Impairment Nursing Diagnoses: Impaired tissue integrity Knowledge deficit related to ulceration/compromised skin integrity Goals: Patient/caregiver will verbalize understanding of skin care regimen [name], [name] ([id number]) Date Initiated: 12/23/2014 Goal Status: Active Ulcer/skin breakdown will heal within 14 weeks Date Initiated: 12/23/2014 Goal Status: Active Interventions: Assess patient/caregiver ability to obtain necessary supplies Assess patient/caregiver ability to perform ulcer/skin care regimen upon admission and as needed Assess ulceration(s) every visit Provide education on ulcer and skin care Treatment Activities: Skin care regimen initiated : 11/05/2015 Topical wound management initiated : 11/05/2015 Notes: Electronic Signature(s) Signed: 06/13/2016 5:14:32 PM By: Montey Hora Entered By: Montey Hora on 06/13/2016 10:58:27 [name], [name] ([id number]) -------------------------------------------------------------------------------- Pain Assessment Details [name]. Date of Service:  06/13/2016 10:00 AM Medical Record Number: [id number] Patient Account Number: [id number] Date of Birth/Sex: September 13, 1955 (60 y.o. Female) Treating RN: Montey Hora Primary Care Physician: [name] Other Clinician: Referring Physician: [name] Treating Physician/Extender: Frann Rider in Treatment: 76 Active Problems Location of Pain Severity and Description of Pain Patient Has Paino No Site Locations Pain Management and Medication Current Pain Management: Notes Topical or injectable lidocaine is offered to patient for acute pain when surgical debridement is performed. If needed, Patient is instructed to use over the counter pain medication for the following 24-48 hours after debridement. Wound care MDs do not prescribed pain medications. Patient has chronic pain or uncontrolled pain. Patient has been instructed to make an appointment with their Primary Care Physician for pain management. Electronic Signature(s) Signed: 06/13/2016 5:14:32 PM By: Montey Hora Entered By: Montey Hora on 06/13/2016 10:54:13 Morawski, [name] ([id number]) -------------------------------------------------------------------------------- Patient/Caregiver Education Details Patient Name:  [name], [name] Date of Service: 06/13/2016 10:00 AM Medical Record Number: [id number] Patient Account Number: [id number] Date of Birth/Gender: 26-Apr-1956 (60 y.o. Female) Treating RN: Montey Hora Primary Care Physician: [name] Other Clinician: Referring Physician: [name] Treating Physician/Extender: Frann Rider in Treatment: 39 Education Assessment Education Provided To: Patient Education Topics Provided Wound/Skin Impairment: Handouts: Other: wound care as ordered Methods: Explain/Verbal Responses: State content correctly Electronic Signature(s) Signed: 06/13/2016 5:14:32 PM By: Montey Hora Entered By: Montey Hora on 06/13/2016 10:59:39 [name], [name]  ([id number]) -------------------------------------------------------------------------------- Wound Assessment Details [name], [name] Date of Service: 06/13/2016 10:00 AM Medical Record Number: [id number] Patient Account Number: [id number] Date of Birth/Sex: [birth year] (60 y.o. Female) Treating RN: Montey Hora Primary Care Physician: [name] Other Clinician: Referring Physician: [name] Treating Physician/Extender: Cathie Olden in Treatment: 76 Wound Status Wound Number: 1 Primary Pressure Ulcer Etiology: Wound Location: Right Back Wound Open Wounding Event: Blister Status: Date Acquired: 10/19/2013 Comorbid Glaucoma, Anemia, Coronary Artery Weeks Of Treatment: 76 History: Disease, Hypertension, Type II Clustered Wound: No Diabetes, End Stage Renal Disease, Osteoarthritis, Neuropathy Photos Wound Measurements Length: (cm) 0.1 Width: (cm) 0.1 Depth: (cm) 0.1 Area: (cm) 0.008 Volume: (cm) 0.001 % Reduction in Area: 100% % Reduction in Volume: 100% Epithelialization: Large (67-100%) Tunneling: No Undermining: No Wound Description Classification: Category/Stage II Wound Margin: Flat and Intact Exudate Amount: Small Exudate Type: Sanguinous Exudate Color: red Foul Odor After Cleansing: No Wound Bed Granulation Amount: Large (67-100%) Exposed Structure Granulation Quality: Red, Friable Fascia Exposed: No Necrotic Amount: None Present (0%) Fat Layer Exposed: No Tendon Exposed: No [name], [name] ([id number]) Muscle Exposed: No Joint Exposed: No Bone Exposed: No Limited to Skin Breakdown Periwound Skin Texture Texture Color No Abnormalities Noted: No No Abnormalities Noted: No Callus: No Atrophie Blanche: No Crepitus: No Cyanosis: No Excoriation: No Ecchymosis: Yes Fluctuance: No Erythema: No Friable: No Hemosiderin Staining: No Induration: No Mottled: No Localized Edema: No Pallor: No Rash: No Rubor:  No Scarring: Yes Temperature / Pain Moisture Temperature: No Abnormality No Abnormalities Noted: No Dry / Scaly: No Maceration: No Moist: Yes Wound Preparation Ulcer Cleansing: Rinsed/Irrigated with Saline Topical Anesthetic Applied: None Treatment Notes Wound #1 (Right Back) 1. Cleansed with: Clean wound with Normal Saline 3. Peri-wound Care: Skin Prep 4. Dressing Applied: Other dressing (specify in notes) 5. Secondary [place] Notes tritec silver foam Electronic Signature(s) Signed: 06/13/2016 5:14:32 PM By: Montey Hora Entered By: Montey Hora on 06/13/2016 10:26:29 Brienza, [name] ([id number]) -------------------------------------------------------------------------------- Vitals Details [name]. Date of Service: 06/13/2016 10:00 AM Medical Record Number: [id number] Patient Account Number: [id number] Date of Birth/Sex: 01-24-1956 (60 y.o. Female) Treating RN: Montey Hora Primary Care Physician: [name] Other Clinician: Referring Physician: [name] Treating Physician/Extender: Frann Rider in Treatment: 28 Vital Signs Time Taken: 10:18 Temperature (F): 98.1 Height (in): 65 Pulse (bpm): 82 Weight (lbs): 156 Respiratory Rate (breaths/min): 18 Body Mass Index (BMI): 26 Blood Pressure (mmHg): 195/69 Reference Range: 80 - 120 mg / dl Electronic Signature(s) Signed: 06/13/2016 5:14:32 PM By: Montey Hora Entered By: Montey Hora on 06/13/2016 10:54:21

## 2016-06-17 ENCOUNTER — Ambulatory Visit: Payer: Medicare Other | Admitting: Nurse Practitioner

## 2016-06-20 ENCOUNTER — Encounter: Payer: Medicare Other | Attending: Surgery | Admitting: Surgery

## 2016-06-20 DIAGNOSIS — S80821A Blister (nonthermal), right lower leg, initial encounter: Secondary | ICD-10-CM | POA: Insufficient documentation

## 2016-06-20 DIAGNOSIS — L89112 Pressure ulcer of right upper back, stage 2: Secondary | ICD-10-CM | POA: Insufficient documentation

## 2016-06-20 DIAGNOSIS — E785 Hyperlipidemia, unspecified: Secondary | ICD-10-CM | POA: Insufficient documentation

## 2016-06-20 DIAGNOSIS — X58XXXA Exposure to other specified factors, initial encounter: Secondary | ICD-10-CM | POA: Insufficient documentation

## 2016-06-20 DIAGNOSIS — D649 Anemia, unspecified: Secondary | ICD-10-CM | POA: Diagnosis not present

## 2016-06-20 DIAGNOSIS — I2510 Atherosclerotic heart disease of native coronary artery without angina pectoris: Secondary | ICD-10-CM | POA: Insufficient documentation

## 2016-06-20 DIAGNOSIS — I120 Hypertensive chronic kidney disease with stage 5 chronic kidney disease or end stage renal disease: Secondary | ICD-10-CM | POA: Diagnosis not present

## 2016-06-20 DIAGNOSIS — E1122 Type 2 diabetes mellitus with diabetic chronic kidney disease: Secondary | ICD-10-CM | POA: Insufficient documentation

## 2016-06-20 DIAGNOSIS — N186 End stage renal disease: Secondary | ICD-10-CM | POA: Diagnosis not present

## 2016-06-20 DIAGNOSIS — E11622 Type 2 diabetes mellitus with other skin ulcer: Secondary | ICD-10-CM | POA: Diagnosis not present

## 2016-06-20 DIAGNOSIS — Z992 Dependence on renal dialysis: Secondary | ICD-10-CM | POA: Insufficient documentation

## 2016-06-21 NOTE — Progress Notes (Addendum)
[name], [name] ([id number]) Visit Report for 06/20/2016 Arrival Information Details [name], [name]. Date of Service: 06/20/2016 2:15 PM Medical Record Number: [id number] Patient Account Number: [id number] Date of Birth/Sex: 17-Apr-1956 (60 y.o. Female) Treating RN: Cornell Barman Primary Care Physician: [name] Other Clinician: Referring Physician: [name] Treating Physician/Extender: Frann Rider in Treatment: 41 Visit Information History Since Last Visit Added or deleted any medications: No Patient Arrived: Walker Any new allergies or adverse reactions: No Arrival Time: 14:29 Had a fall or experienced change in No Accompanied By: self activities of daily living that may affect Transfer Assistance: None risk of falls: Patient Identification Verified: Yes Signs or symptoms of abuse/neglect since last No Secondary Verification Process Yes visito Completed: Has Dressing in Place as Prescribed: Yes Patient Requires Transmission- No Pain Present Now: No Based Precautions: Patient Has Alerts: Yes Patient Alerts: Patient on Blood Thinner Electronic Signature(s) Signed: 06/20/2016 4:29:11 PM By: Gretta Cool, RN, [name], Kim RN, [name] Entered By: Gretta Cool, RN, [name], Kim on 06/20/2016 14:30:02 [name], [name] ([id number]) -------------------------------------------------------------------------------- Clinic Level of Care Assessment Details [name]. Date of Service: 06/20/2016 2:15 PM Medical Record Number: [id number] Patient Account Number: [id number] Date of Birth/Sex: [birth year] (60 y.o. Female) Treating RN: Cornell Barman Primary Care Physician: [name] Other Clinician: Referring Physician: [name] Treating Physician/Extender: Frann Rider in Treatment: 64 Clinic Level of Care Assessment Items TOOL 4 Quantity Score []  - Use when only an EandM is performed on FOLLOW-UP visit 0 ASSESSMENTS - Nursing Assessment / Reassessment []  -  Reassessment of Co-morbidities (includes updates in patient status) 0 X - Reassessment of Adherence to Treatment Plan 1 5 ASSESSMENTS - Wound and Skin Assessment / Reassessment X - Simple Wound Assessment / Reassessment - one wound 1 5 []  - Complex Wound Assessment / Reassessment - multiple wounds 0 []  - Dermatologic / Skin Assessment (not related to wound area) 0 ASSESSMENTS - Focused Assessment []  - Circumferential Edema Measurements - multi extremities 0 []  - Nutritional Assessment / Counseling / Intervention 0 []  - Lower Extremity Assessment (monofilament, tuning fork, pulses) 0 []  - Peripheral Arterial Disease Assessment (using hand held doppler) 0 ASSESSMENTS - Ostomy and/or Continence Assessment and Care []  - Incontinence Assessment and Management 0 []  - Ostomy Care Assessment and Management (repouching, etc.) 0 PROCESS - Coordination of Care X - Simple Patient / Family Education for ongoing care 1 15 []  - Complex (extensive) Patient / Family Education for ongoing care 0 []  - Staff obtains Programmer, systems, Records, Test Results / Process Orders 0 []  - Staff telephones HHA, Nursing Homes / Clarify orders / etc 0 []  - Routine Transfer to another Facility (non-emergent condition) 0 [name], [name] ([id number]) []  - Routine Hospital Admission (non-emergent condition) 0 []  - New Admissions / Biomedical engineer / Ordering NPWT, Apligraf, etc. 0 []  - Emergency Hospital Admission (emergent condition) 0 X - Simple Discharge Coordination 1 10 []  - Complex (extensive) Discharge Coordination 0 PROCESS - Special Needs []  - Pediatric / Minor Patient Management 0 []  - Isolation Patient Management 0 []  - Hearing / Language / Visual special needs 0 []  - Assessment of Community assistance (transportation, D/C planning, etc.) 0 []  - Additional assistance / Altered mentation 0 []  - Support Surface(s) Assessment (bed, cushion, seat, etc.) 0 INTERVENTIONS - Wound Cleansing / Measurement X - Simple  Wound Cleansing - one wound 1 5 []  - Complex Wound Cleansing - multiple wounds 0 X - Wound Imaging (photographs - any number  of wounds) 1 5 []  - Wound Tracing (instead of photographs) 0 X - Simple Wound Measurement - one wound 1 5 []  - Complex Wound Measurement - multiple wounds 0 INTERVENTIONS - Wound Dressings X - Small Wound Dressing one or multiple wounds 1 10 []  - Medium Wound Dressing one or multiple wounds 0 []  - Large Wound Dressing one or multiple wounds 0 []  - Application of Medications - topical 0 []  - Application of Medications - injection 0 INTERVENTIONS - Miscellaneous []  - External ear exam 0 [name], [name] ([id number]) []  - Specimen Collection (cultures, biopsies, blood, body fluids, etc.) 0 []  - Specimen(s) / Culture(s) sent or taken to Lab for analysis 0 []  - Patient Transfer (multiple staff / Harrel Lemon Lift / Similar devices) 0 []  - Simple Staple / Suture removal (25 or less) 0 []  - Complex Staple / Suture removal (26 or more) 0 []  - Hypo / Hyperglycemic Management (close monitor of Blood Glucose) 0 []  - Ankle / Brachial Index (ABI) - do not check if billed separately 0 X - Vital Signs 1 5 Has the patient been seen at the hospital within the last three years: Yes Total Score: 65 Level Of Care: New/Established - Level 2 Electronic Signature(s) Signed: 06/20/2016 4:29:11 PM By: Gretta Cool, RN, [name], Kim RN, [name] Entered By: Gretta Cool, RN, [name], Kim on 06/20/2016 14:40:33 [name], [name] ([id number]) -------------------------------------------------------------------------------- Encounter Discharge Information Details [name], [name]. Date of Service: 06/20/2016 2:15 PM Medical Record Number: [id number] Patient Account Number: [id number] Date of Birth/Sex: 06/18/1956 (60 y.o. Female) Treating RN: Cornell Barman Primary Care Physician: [name] Other Clinician: Referring Physician: [name] Treating Physician/Extender: Frann Rider in Treatment:  59 Encounter Discharge Information Items Discharge Pain Level: 0 Discharge Condition: Stable Ambulatory Status: Ambulatory Discharge Destination: Home Transportation: Private Auto Accompanied By: self Schedule Follow-up Appointment: Yes Medication Reconciliation completed and provided to Patient/Care Yes Ysmael Hires: Provided on Clinical Summary of Care: 06/20/2016 Form Type Recipient Paper Patient LD Electronic Signature(s) Signed: 06/20/2016 2:43:30 PM By: Ruthine Dose Entered By: Ruthine Dose on 06/20/2016 14:43:30 Poer, [name] ([id number]) -------------------------------------------------------------------------------- Multi Wound Chart Details [name]. Date of Service: 06/20/2016 2:15 PM Medical Record Number: [id number] Patient Account Number: [id number] Date of Birth/Sex: 1956/02/12 (60 y.o. Female) Treating RN: Cornell Barman Primary Care Physician: [name] Other Clinician: Referring Physician: [name] Treating Physician/Extender: Frann Rider in Treatment: 63 Vital Signs Height(in): 65 Pulse(bpm): 87 Weight(lbs): 156 Blood Pressure 183/65 (mmHg): Body Mass Index(BMI): 26 Temperature(F): 98.4 Respiratory Rate 16 (breaths/min): Photos: [N/A:N/A] Wound Location: Right Back N/A N/A Wounding Event: Blister N/A N/A Primary Etiology: Pressure Ulcer N/A N/A Comorbid History: Glaucoma, Anemia, N/A N/A Coronary Artery Disease, Hypertension, Type II Diabetes, End Stage Renal Disease, Osteoarthritis, Neuropathy Date Acquired: 10/19/2013 N/A N/A Weeks of Treatment: 77 N/A N/A Wound Status: Open N/A N/A Measurements L x W x D 0x0x0 N/A N/A (cm) Area (cm) : 0 N/A N/A Volume (cm) : 0 N/A N/A % Reduction in Area: 100.00% N/A N/A % Reduction in Volume: 100.00% N/A N/A Classification: Category/Stage II N/A N/A Exudate Amount: Small N/A N/A Exudate Type: Sanguinous N/A N/A Exudate Color: red N/A N/A Wound Margin: Flat and  Intact N/A N/A Granulation Amount: None Present (0%) N/A N/A [name], [name] ([id number]) Necrotic Amount: None Present (0%) N/A N/A Exposed Structures: Fascia: No N/A N/A Fat: No Tendon: No Muscle: No Joint: No Bone: No Limited to Skin Breakdown Epithelialization: Large (67-100%) N/A N/A Periwound Skin  Texture: Scarring: Yes N/A N/A Edema: No Excoriation: No Induration: No Callus: No Crepitus: No Fluctuance: No Friable: No Rash: No Periwound Skin Dry/Scaly: Yes N/A N/A Moisture: Maceration: No Moist: No Periwound Skin Color: Ecchymosis: Yes N/A N/A Atrophie Blanche: No Cyanosis: No Erythema: No Hemosiderin Staining: No Mottled: No Pallor: No Rubor: No Temperature: No Abnormality N/A N/A Tenderness on No N/A N/A Palpation: Wound Preparation: Ulcer Cleansing: N/A N/A Rinsed/Irrigated with Saline Topical Anesthetic Applied: None Treatment Notes Electronic Signature(s) Signed: 06/20/2016 4:29:11 PM By: Gretta Cool, RN, [name], Kim RN, [name] Entered By: Gretta Cool, RN, [name], Kim on 06/20/2016 14:38:03 Tokarczyk, [name] ([id number]) -------------------------------------------------------------------------------- Multi-Disciplinary Care Plan Details [name], [name]. Date of Service: 06/20/2016 2:15 PM Medical Record Number: [id number] Patient Account Number: [id number] Date of Birth/Sex: 01-08-1956 (60 y.o. Female) Treating RN: Cornell Barman Primary Care Physician: [name] Other Clinician: Referring Physician: [name] Treating Physician/Extender: Frann Rider in Treatment: 48 Active Inactive Electronic Signature(s) Signed: 06/28/2016 2:50:09 PM By: Gretta Cool RN, [name], Kim RN, [name] Previous Signature: 06/20/2016 4:29:11 PM Version By: Gretta Cool, RN, [name], Kim RN, [name] Entered By: Gretta Cool, RN, [name], Kim on 06/28/2016 14:50:08 [name], [name] ([id number]) -------------------------------------------------------------------------------- Pain Assessment Details Patient  Name: [name], [name]. Date of Service: 06/20/2016 2:15 PM Medical Record Number: [id number] Patient Account Number: [id number] Date of Birth/Sex: 10-26-1955 (60 y.o. Female) Treating RN: Cornell Barman Primary Care Physician: [name] Other Clinician: Referring Physician: [name] Treating Physician/Extender: Frann Rider in Treatment: 77 Active Problems Location of Pain Severity and Description of Pain Patient Has Paino No Site Locations With Dressing Change: No Pain Management and Medication Current Pain Management: Electronic Signature(s) Signed: 06/20/2016 4:29:11 PM By: Gretta Cool, RN, [name], Kim RN, [name] Entered By: Gretta Cool, RN, [name], Kim on 06/20/2016 14:30:10 [name], [name] ([id number]) -------------------------------------------------------------------------------- Patient/Caregiver Education Details [name]. Date of Service: 06/20/2016 2:15 PM Medical Record Number: [id number] Patient Account Number: [id number] Date of Birth/Gender: 01-18-1956 (60 y.o. Female) Treating RN: Cornell Barman Primary Care Physician: [name] Other Clinician: Referring Physician: [name] Treating Physician/Extender: Frann Rider in Treatment: 69 Education Assessment Education Provided To: Patient Education Topics Provided Wound/Skin Impairment: Handouts: Caring for Your Ulcer Methods: Demonstration Responses: State content correctly Electronic Signature(s) Signed: 06/20/2016 4:29:11 PM By: Gretta Cool, RN, [name], Kim RN, [name] Entered By: Gretta Cool, RN, [name], Kim on 06/20/2016 14:41:54 Bittinger, [name] ([id number]) -------------------------------------------------------------------------------- Wound Assessment Details Patient Name: KIAMBER, GRETZ. Date of Service: 06/20/2016 2:15 PM Medical Record Number: [id number] Patient Account Number: [id number] Date of Birth/Sex: [birth year] (60 y.o. Female) Treating RN: Cornell Barman Primary Care Physician: [name] Other  Clinician: Referring Physician: [name] Treating Physician/Extender: Frann Rider in Treatment: 77 Wound Status Wound Number: 1 Primary Pressure Ulcer Etiology: Wound Location: Right Back Wound Open Wounding Event: Blister Status: Date Acquired: 10/19/2013 Comorbid Glaucoma, Anemia, Coronary Artery Weeks Of Treatment: 77 History: Disease, Hypertension, Type II Clustered Wound: No Diabetes, End Stage Renal Disease, Osteoarthritis, Neuropathy Photos Wound Measurements Length: (cm) 0 Width: (cm) 0 Depth: (cm) 0 Area: (cm) 0 Volume: (cm) 0 % Reduction in Area: 100% % Reduction in Volume: 100% Epithelialization: Large (67-100%) Tunneling: No Undermining: No Wound Description Classification: Category/Stage II Wound Margin: Flat and Intact Exudate Amount: Small Exudate Type: Sanguinous Exudate Color: red Foul Odor After Cleansing: No Wound Bed Granulation Amount: None Present (0%) Exposed Structure Necrotic Amount: None Present (0%) Fascia Exposed: No Fat Layer Exposed: No Tendon Exposed: No Muscle Exposed: No Joint Exposed: No Hansley,  Arien Chauncey Cruel ([id number]) Bone Exposed: No Limited to Skin Breakdown Periwound Skin Texture Texture Color No Abnormalities Noted: No No Abnormalities Noted: No Callus: No Atrophie Blanche: No Crepitus: No Cyanosis: No Excoriation: No Ecchymosis: Yes Fluctuance: No Erythema: No Friable: No Hemosiderin Staining: No Induration: No Mottled: No Localized Edema: No Pallor: No Rash: No Rubor: No Scarring: Yes Temperature / Pain Moisture Temperature: No Abnormality No Abnormalities Noted: No Dry / Scaly: Yes Maceration: No Moist: No Wound Preparation Ulcer Cleansing: Rinsed/Irrigated with Saline Topical Anesthetic Applied: None Electronic Signature(s) Signed: 06/20/2016 4:29:11 PM By: Gretta Cool, RN, [name], Kim RN, [name] Entered By: Gretta Cool, RN, [name], Kim on 06/20/2016 14:36:06 Collinsworth, [name]  ([id number]) -------------------------------------------------------------------------------- Vitals Details [name]. Date of Service: 06/20/2016 2:15 PM Medical Record Number: [id number] Patient Account Number: [id number] Date of Birth/Sex: [birth year] (60 y.o. Female) Treating RN: Cornell Barman Primary Care Physician: [name] Other Clinician: Referring Physician: [name] Treating Physician/Extender: Frann Rider in Treatment: 36 Vital Signs Time Taken: 14:30 Temperature (F): 98.4 Height (in): 65 Pulse (bpm): 87 Weight (lbs): 156 Respiratory Rate (breaths/min): 16 Body Mass Index (BMI): 26 Blood Pressure (mmHg): 183/65 Reference Range: 80 - 120 mg / dl Electronic Signature(s) Signed: 06/20/2016 4:29:11 PM By: Gretta Cool, RN, [name], Kim RN, [name] Entered By: Gretta Cool, RN, [name], Kim on 06/20/2016 14:30:46

## 2016-06-21 NOTE — Progress Notes (Addendum)
[name], [name] ([id number]) Visit Report for 06/20/2016 Chief Complaint Document Details [name], [name]. Date of Service: 06/20/2016 2:15 PM Medical Record Number: [id number] Patient Account Number: [id number] Date of Birth/Sex: 1955-10-25 (60 y.o. Female) Treating RN: Montey [name] Primary Care Physician: [name] Other Clinician: Referring Physician: Nicky [name] Treating Physician/Extender: [name] in Treatment: 49 Information Obtained from: Patient Chief Complaint Patient presents to the wound care center for a consult due non healing wound. She has an open wound on her right upper back which she's had for about a year and she recently noticed a blister on her right lower extremity about 2 weeks ago. Electronic Signature(s) Signed: 06/20/2016 2:44:36 PM By: [name] Fudge MD, [name] Entered By: [name] on 06/20/2016 14:44:36 [name], [name] ([id number]) -------------------------------------------------------------------------------- HPI Details [name], [name]. Date of Service: 06/20/2016 2:15 PM Medical Record Number: [id number] Patient Account Number: [id number] Date of Birth/Sex: 1956/03/20 (60 y.o. Female) Treating RN: Montey [name] Primary Care Physician: [name] Other Clinician: Referring Physician: Nicky [name] Treating Physician/Extender: [name] in Treatment: 68 History of Present Illness Location: right upper back and right lower extremity wounds Quality: Patient reports No Pain. Severity: Patient states wound (s) are getting better. Duration: Patient has had the wound for > 3 months prior to seeking treatment at the wound center Timing: she thought it first occurred when she was using a heating pad about a year ago after back surgery. Context: The wound appeared gradually over time Modifying Factors: Patient is currently on renal dialysis and receives treatments 3 times weekly Associated Signs and  Symptoms: Patient reports having: surgery scheduled for this week for a AV fistula left arm. HPI Description: 60 year old patient who is known to be a diabetic and has end-stage renal disease has had several comorbidities including coronary artery disease, hypertension, hyperlipidemia, pancreatitis, anemia, previous history of hysterectomy, cholecystectomy, left-sided salivary gland excision, bilateral cataract surgery,Peritoneal dialysis catheter, hemodialysis catheter. the area on the back has also been caused by instant pressure she used to sleep on a recliner all day and has significant kyphoscoliosis. As far as the wound on her right lower extremity she's not sure how this blister occurred but she thought it has been there for about 2 weeks. No recent blood investigations available and no recent hemoglobin A1c. 12/30/2014 -- she is an assisted living facility but I believe the nurses that have not followed instructions as she had some cream applied on her back and there was a different dressing. Last week she's had a AV fistula placed on her left forearm. 01/13/2015 -- she has had some localized infection at the port site and she's been on doxycycline for this. 02/05/2015 - he has developed a small blister on her right lower extremity. 02/10/2015 -- she has developed another small blister on her right anterior chest wall in the area where she's had tape for her dialysis access. this may just be injury caused by a tape burn. 02/18/2015 -- no new blisters and she had a dermatology opinion and they have taken a biopsy of her skin. She also had a left brachial AV fistula placed this week. 03/03/2015 -- though we do not have the pathology report yet the patient says she has been put on prednisone because this skin disease is possibly to do with her immune system and her dermatologist is recommended this. 03/10/2015 -- she has developed a new blister which is quite large on her right lower  extremity on the shin.  05/22/2015 -- she did very well with her dressing but on trying to remove the [place] it peels away the newly healed skin. [name], [name] ([id number]) 06/05/2015 -- the patient was in the ER this past week for severe bleeding from the right nostril and had to have ENT see her and do a packing. They've also been holding her heparin during her dialysis. The patient is going back to ENT today. Other than that she has had no other significant issues. 06/19/2015 -- she is back on her blood thinners and continues to have her hemodialysis as before. 07/31/2015 -- a large blister has popped up again on her right lower extremity in the same place in the mid shin area in the anterior lateral compartment. 10/05/2015 -- last week she was admitted to the hospital for 4 days as she had a pneumonia and was treated with IV and oral antibiotics. 04/22/16: f/u for right upper back wound. no systemic s/s of infection. no new wounds or skin breakdown reported. 04/29/16: returns today for f/u. no new wounds or skin breakdown. no systemic s/s of infection. no interval changes regarding health status. 05/27/2016 -- she continues to come to see [place] for palliative care or complex care as the problem almost heals and then the blister reopens. Electronic Signature(s) Signed: 06/20/2016 2:44:43 PM By: [name] Fudge MD, [name] Entered By: [name] on 06/20/2016 14:44:43 [name], [name] ([id number]) -------------------------------------------------------------------------------- Physical Exam Details [name], 80. Date of Service: 06/20/2016 2:15 PM Medical Record Number: [id number] Patient Account Number: [id number] Date of Birth/Sex: 12/30/1955 (60 y.o. Female) Treating RN: Montey [name] Primary Care Physician: [name] Other Clinician: Referring Physician: Nicky [name] Treating Physician/Extender: [name] in Treatment: 16 Constitutional . Pulse  regular. Respirations normal and unlabored. Afebrile. . Eyes Nonicteric. Reactive to light. Ears, Nose, Mouth, and Throat Lips, teeth, and gums WNL.Marland Kitchen Moist mucosa without lesions. Neck supple and nontender. No palpable supraclavicular or cervical adenopathy. Normal sized without goiter. Respiratory WNL. No retractions.. Breath sounds WNL, No rubs, rales, rhonchi, or wheeze.. Cardiovascular Pedal Pulses WNL. No clubbing, cyanosis or edema. Lymphatic No adneopathy. No adenopathy. No adenopathy. Musculoskeletal Adexa without tenderness or enlargement.. Digits and nails w/o clubbing, cyanosis, infection, petechiae, ischemia, or inflammatory conditions.. Integumentary (Hair, Skin) No suspicious lesions. No crepitus or fluctuance. No peri-wound warmth or erythema. No masses.Marland Kitchen Psychiatric Judgement and insight Intact.. No evidence of depression, anxiety, or agitation.. Notes the wounds have completely healed Electronic Signature(s) Signed: 06/20/2016 2:45:02 PM By: [name] Fudge MD, [name] Entered By: [name] on 06/20/2016 14:45:01 [place], [name] ([id number]) -------------------------------------------------------------------------------- Physician Orders Details [name]. Date of Service: 06/20/2016 2:15 PM Medical Record Number: [id number] Patient Account Number: [id number] Date of Birth/Sex: [birth year] (60 y.o. Female) Treating RN: Cornell Barman Primary Care Physician: [name] Other Clinician: Referring Physician: Nicky [name] Treating Physician/Extender: [name] in Treatment: 19 Verbal / Phone Orders: Yes Clinician: Cornell Barman Read Back and Verified: Yes Diagnosis Coding Wound Cleansing Wound #1 Right Back o May Shower, gently pat wound dry prior to applying new dressing. - PATIENT MAY SHOWER AND [place] CNA ASSISTANCE AND PAT WOUND DRY AND SNF NURSE TO APPLY NEW DRESSING RIGHT AFTERWARDS Skin  Barriers/Peri-Wound Care Wound #1 Right Back o Skin Prep Primary Wound Dressing Wound #1 Right Back o Mepitel One Contact layer - Protection for new skin. Remove gently to avoid injuring new skin. Secondary Dressing Wound #1 Right Back o Boardered  Foam Dressing - Protection for new skin. Remove gently to avoid injuring new skin. Dressing Change Frequency o Other: - Continue dressing changes on days that patient showers (protect skin for an additional 2 weeks with Mepitel and foam dressing to ensure skin remains healed). If remains healed, discontinue dressing the wound. Follow-up Appointments Wound #1 Right Back o Other: - Not needed unless wound opens back up. Discharge From [facility] Services Wound #1 Right Back o Discharge from [place] - treatment complete Electronic Signature(s) Signed: 06/20/2016 4:26:33 PM By: [name] Fudge MD, [name] Signed: 06/20/2016 4:29:11 PM By: Gretta Cool RN, [name], Kim RN, [name] [place], Bintou Chauncey Cruel ([id number]) Entered By: Gretta Cool RN, [name], Kim on 06/20/2016 16:08:09 [place], [name] ([id number]) -------------------------------------------------------------------------------- Problem List Details [name], [name]. Date of Service: 06/20/2016 2:15 PM Medical Record Number: [id number] Patient Account Number: [id number] Date of Birth/Sex: 08/17/1955 (60 y.o. Female) Treating RN: Montey [name] Primary Care Physician: [name] Other Clinician: Referring Physician: Nicky [name] Treating Physician/Extender: [name] in Treatment: 53 Active Problems ICD-10 Encounter Code Description Active Date Diagnosis E11.622 Type 2 diabetes mellitus with other skin ulcer 12/23/2014 Yes L89.112 Pressure ulcer of right upper back, stage 2 12/23/2014 Yes S80.821A Blister (nonthermal), right lower leg, initial encounter 12/23/2014 Yes N18.6 End stage renal disease 12/23/2014 Yes Inactive Problems Resolved Problems Electronic Signature(s) Signed:  06/20/2016 2:44:29 PM By: [name] Fudge MD, [name] Entered By: [name] on 06/20/2016 14:44:29 [name], [name] ([id number]) -------------------------------------------------------------------------------- Progress Note Details [name], [name] Date of Service: 06/20/2016 2:15 PM Medical Record Number: [id number] Patient Account Number: [id number] Date of Birth/Sex: [birth year] (60 y.o. Female) Treating RN: Montey [name] Primary Care Physician: [name] Other Clinician: Referring Physician: Nicky [name] Treating Physician/Extender: [name] in Treatment: 33 Subjective Chief Complaint Information obtained from Patient Patient presents to the wound care center for a consult due non healing wound. She has an open wound on her right upper back which she's had for about a year and she recently noticed a blister on her right lower extremity about 2 weeks ago. History of Present Illness (HPI) The following HPI elements were documented for the patient's wound: Location: right upper back and right lower extremity wounds Quality: Patient reports No Pain. Severity: Patient states wound (s) are getting better. Duration: Patient has had the wound for > 3 months prior to seeking treatment at the wound center Timing: she thought it first occurred when she was using a heating pad about a year ago after back surgery. Context: The wound appeared gradually over time Modifying Factors: Patient is currently on renal dialysis and receives treatments 3 times weekly Associated Signs and Symptoms: Patient reports having: surgery scheduled for this week for a AV fistula left arm. 60 year old patient who is known to be a diabetic and has end-stage renal disease has had several comorbidities including coronary artery disease, hypertension, hyperlipidemia, pancreatitis, anemia, previous history of hysterectomy, cholecystectomy, left-sided salivary gland excision, bilateral cataract  surgery,Peritoneal dialysis catheter, hemodialysis catheter. the area on the back has also been caused by instant pressure she used to sleep on a recliner all day and has significant kyphoscoliosis. As far as the wound on her right lower extremity she's not sure how this blister occurred but she thought it has been there for about 2 weeks. No recent blood investigations available and no recent hemoglobin A1c. 12/30/2014 -- she is an assisted living facility but I believe the nurses that have not followed instructions as she had some  cream applied on her back and there was a different dressing. Last week she's had a AV fistula placed on her left forearm. 01/13/2015 -- she has had some localized infection at the port site and she's been on doxycycline for this. 02/05/2015 - he has developed a small blister on her right lower extremity. 02/10/2015 -- she has developed another small blister on her right anterior chest wall in the area where she's had tape for her dialysis access. this may just be injury caused by a tape burn. 02/18/2015 -- no new blisters and she had a dermatology opinion and they have taken a biopsy of her skin. [place], [name] ([id number]) She also had a left brachial AV fistula placed this week. 03/03/2015 -- though we do not have the pathology report yet the patient says she has been put on prednisone because this skin disease is possibly to do with her immune system and her dermatologist is recommended this. 03/10/2015 -- she has developed a new blister which is quite large on her right lower extremity on the shin. 05/22/2015 -- she did very well with her dressing but on trying to remove the [place] it peels away the newly healed skin. 06/05/2015 -- the patient was in the ER this past week for severe bleeding from the right nostril and had to have ENT see her and do a packing. They've also been holding her heparin during her dialysis. The patient is going back to ENT  today. Other than that she has had no other significant issues. 06/19/2015 -- she is back on her blood thinners and continues to have her hemodialysis as before. 07/31/2015 -- a large blister has popped up again on her right lower extremity in the same place in the mid shin area in the anterior lateral compartment. 10/05/2015 -- last week she was admitted to the hospital for 4 days as she had a pneumonia and was treated with IV and oral antibiotics. 04/22/16: f/u for right upper back wound. no systemic s/s of infection. no new wounds or skin breakdown reported. 04/29/16: returns today for f/u. no new wounds or skin breakdown. no systemic s/s of infection. no interval changes regarding health status. 05/27/2016 -- she continues to come to see [place] for palliative care or complex care as the problem almost heals and then the blister reopens. Objective Constitutional Pulse regular. Respirations normal and unlabored. Afebrile. Vitals Time Taken: 2:30 PM, Height: 65 in, Weight: 156 lbs, BMI: 26, Temperature: 98.4 F, Pulse: 87 bpm, Respiratory Rate: 16 breaths/min, Blood Pressure: 183/65 mmHg. Eyes Nonicteric. Reactive to light. Ears, Nose, Mouth, and Throat Lips, teeth, and gums WNL.Marland Kitchen Moist mucosa without lesions. Neck supple and nontender. No palpable supraclavicular or cervical adenopathy. Normal sized without goiter. Respiratory [place], [name] ([id number]) WNL. No retractions.. Breath sounds WNL, No rubs, rales, rhonchi, or wheeze.. Cardiovascular Pedal Pulses WNL. No clubbing, cyanosis or edema. Lymphatic No adneopathy. No adenopathy. No adenopathy. Musculoskeletal Adexa without tenderness or enlargement.. Digits and nails w/o clubbing, cyanosis, infection, petechiae, ischemia, or inflammatory conditions.Marland Kitchen Psychiatric Judgement and insight Intact.. No evidence of depression, anxiety, or agitation.. General Notes: the wounds have completely healed Integumentary (Hair, Skin) No  suspicious lesions. No crepitus or fluctuance. No peri-wound warmth or erythema. No masses.. Wound #1 status is Open. Original cause of wound was Blister. The wound is located on the Right Back. The wound measures 0cm length x 0cm width x 0cm depth; 0cm^2 area and 0cm^3 volume. The wound is limited to skin breakdown.  There is no tunneling or undermining noted. There is a small amount of sanguinous drainage noted. The wound margin is flat and intact. There is no granulation within the wound bed. There is no necrotic tissue within the wound bed. The periwound skin appearance exhibited: Scarring, Dry/Scaly, Ecchymosis. The periwound skin appearance did not exhibit: Callus, Crepitus, Excoriation, Fluctuance, Friable, Induration, Localized Edema, Rash, Maceration, Moist, Atrophie Blanche, Cyanosis, Hemosiderin Staining, Mottled, Pallor, Rubor, Erythema. Periwound temperature was noted as No Abnormality. Assessment Active Problems ICD-10 E11.622 - Type 2 diabetes mellitus with other skin ulcer L89.112 - Pressure ulcer of right upper back, stage 2 S80.821A - Blister (nonthermal), right lower leg, initial encounter N18.6 - End stage renal disease Plan [name], [name] ([id number]) Wound Cleansing: Wound #1 Right Back: May Shower, gently pat wound dry prior to applying new dressing. - PATIENT MAY SHOWER AND [place] WOUND WITH SOAP AND WATER WITH CNA ASSISTANCE AND PAT WOUND DRY AND SNF NURSE TO APPLY NEW DRESSING RIGHT AFTERWARDS Skin Barriers/Peri-Wound Care: Wound #1 Right Back: Skin Prep Primary Wound Dressing: Wound #1 Right Back: Mepitel One Contact layer - Protection for new skin. Remove gently to avoid injuring new skin. Secondary Dressing: Wound #1 Right Back: Boardered Foam Dressing - Protection for new skin. Remove gently to avoid injuring new skin. Dressing Change Frequency: Other: - Continue dressing changes on days that patient showers (protect skin for an additional 2 weeks with  Mepitel and foam dressing to ensure skin remains healed). If remains healed, discontinue dressing the wound. Follow-up Appointments: Wound #1 Right Back: Other: - Not needed unless wound opens back up. Discharge From [facility] Services: Wound #1 Right Back: Discharge from [place] - treatment complete Having healed the wounds completely, she has a supple scar, which is very friable and hence I have recommended a nonadherent dressing like Mepitel to be covered with bordered foam and be protected for several weeks. Dressings can be changed on a when necessary basis. She is discharged from the wound care services and will be seen back only as needed Electronic Signature(s) Signed: 06/20/2016 4:32:54 PM By: [name] Fudge MD, [name] Previous Signature: 06/20/2016 2:46:05 PM Version By: [name] Fudge MD, [name] Entered By: [name] on 06/20/2016 16:32:54 [name], [name] ([id number]) -------------------------------------------------------------------------------- SuperBill Details [name]. Date of Service: 06/20/2016 Medical Record Number: [id number] Patient Account Number: [id number] Date of Birth/Sex: 05/09/1956 (60 y.o. Female) Treating RN: Montey [name] Primary Care Physician: [name] Other Clinician: Referring Physician: Nicky [name] Treating Physician/Extender: [name] in Treatment: 77 Diagnosis Coding ICD-10 Codes Code Description E11.622 Type 2 diabetes mellitus with other skin ulcer L89.112 Pressure ulcer of right upper back, stage 2 S80.821A Blister (nonthermal), right lower leg, initial encounter N18.6 End stage renal disease Facility Procedures CPT4 Code: [id number] Description: [id number] - WOUND CARE VISIT-LEV 2 EST PT Modifier: Quantity: 1 Physician Procedures CPT4 Code: [id number] Description: [id number] - WC PHYS LEVEL 2 - EST PT ICD-10 Description Diagnosis E11.622 Type 2 diabetes mellitus with other skin ulcer L89.112 Pressure ulcer of  right upper back, stage 2 S80.821A Blister (nonthermal), right lower leg, initial enc Modifier: ounter Quantity: 1 Electronic Signature(s) Signed: 06/20/2016 2:46:23 PM By: [name] Fudge MD, [name] Entered By: [name] on 06/20/2016 14:46:23

## 2016-06-23 ENCOUNTER — Ambulatory Visit: Payer: Medicare Other | Admitting: Surgery

## 2016-08-01 ENCOUNTER — Ambulatory Visit
Admission: RE | Admit: 2016-08-01 | Discharge: 2016-08-01 | Disposition: A | Discharge status: Home / Self Care | Payer: Medicare Other | Source: Ambulatory Visit | Attending: Internal Medicine | Admitting: Internal Medicine

## 2016-08-01 ENCOUNTER — Other Ambulatory Visit: Payer: Self-pay | Admitting: Internal Medicine

## 2016-08-01 DIAGNOSIS — R0602 Shortness of breath: Secondary | ICD-10-CM

## 2016-08-01 DIAGNOSIS — R05 Cough: Secondary | ICD-10-CM | POA: Insufficient documentation

## 2016-08-01 DIAGNOSIS — R059 Cough, unspecified: Secondary | ICD-10-CM

## 2016-08-01 DIAGNOSIS — J9811 Atelectasis: Secondary | ICD-10-CM | POA: Diagnosis not present

## 2016-08-18 ENCOUNTER — Other Ambulatory Visit (INDEPENDENT_AMBULATORY_CARE_PROVIDER_SITE_OTHER): Payer: Self-pay | Admitting: Vascular Surgery

## 2016-08-18 DIAGNOSIS — T829XXA Unspecified complication of cardiac and vascular prosthetic device, implant and graft, initial encounter: Secondary | ICD-10-CM

## 2016-08-18 DIAGNOSIS — N185 Chronic kidney disease, stage 5: Secondary | ICD-10-CM

## 2016-08-19 ENCOUNTER — Ambulatory Visit (INDEPENDENT_AMBULATORY_CARE_PROVIDER_SITE_OTHER): Payer: Medicare Other | Admitting: Vascular Surgery

## 2016-08-19 ENCOUNTER — Ambulatory Visit (INDEPENDENT_AMBULATORY_CARE_PROVIDER_SITE_OTHER): Payer: Medicare Other

## 2016-08-19 ENCOUNTER — Encounter (INDEPENDENT_AMBULATORY_CARE_PROVIDER_SITE_OTHER): Payer: Self-pay | Admitting: Vascular Surgery

## 2016-08-19 VITALS — BP 137/68 | HR 88 | Resp 16 | Ht 61.0 in | Wt 177.0 lb

## 2016-08-19 DIAGNOSIS — Z992 Dependence on renal dialysis: Secondary | ICD-10-CM | POA: Diagnosis not present

## 2016-08-19 DIAGNOSIS — N186 End stage renal disease: Secondary | ICD-10-CM

## 2016-08-19 DIAGNOSIS — I10 Essential (primary) hypertension: Secondary | ICD-10-CM

## 2016-08-19 DIAGNOSIS — N185 Chronic kidney disease, stage 5: Secondary | ICD-10-CM | POA: Diagnosis not present

## 2016-08-19 DIAGNOSIS — E785 Hyperlipidemia, unspecified: Secondary | ICD-10-CM

## 2016-08-19 DIAGNOSIS — T829XXA Unspecified complication of cardiac and vascular prosthetic device, implant and graft, initial encounter: Secondary | ICD-10-CM

## 2016-08-19 NOTE — Progress Notes (Signed)
MRN : [id number]  [name] is a 61 y.o. (1955/07/19) female who presents with chief complaint of  Chief Complaint  Patient presents with  . Re-evaluation    6 month follow up  .  History of Present Illness: Patient returns today in follow up of ESRD and dialysis access.  Her left brachiocephalic AV fistula is working well without any major issues currently. The access is working well without any current issues with difficult access, prolonged bleeding or diminished flow. Her duplex today shows some mildly elevated velocities at the access site but no high-grade stenosis in her left brachiocephalic AV fistula.  Current Outpatient Prescriptions  Medication Sig Dispense Refill  . acetaminophen (TYLENOL) 325 MG tablet Take 650 mg by mouth every 6 (six) hours as needed for mild pain.     . Albuterol Sulfate (PROAIR RESPICLICK) 123XX123 (90 Base) MCG/ACT AEPB Inhale 2 puffs into the lungs every 4 (four) hours as needed (for wheezing/shortness of breath).    Marland Kitchen aspirin EC 81 MG tablet Take 81 mg by mouth daily.    Marland Kitchen atorvastatin (LIPITOR) 20 MG tablet Take 20 mg by mouth every evening.     . calcium acetate (PHOSLO) 667 MG capsule Take 1,334-2,001 mg by mouth See admin instructions. Pt takes two capsules with meals once daily on Tuesday, Thursday, and Saturday. Pt takes three capsules twice daily with meals on Sunday, Monday, Wednesday, and Friday.    . carvedilol (COREG) 6.25 MG tablet Take 1 tablet (6.25 mg total) by mouth 2 (two) times daily. 60 tablet 0  . clobetasol cream (TEMOVATE) [id number] % Apply 1 application topically 2 (two) times daily as needed (for itching).    . clopidogrel (PLAVIX) 75 MG tablet Take 75 mg by mouth daily.     . COLLAGEN MATRIX-SILVER EX Apply 1 application topically as needed (for wound care).    Marland Kitchen docusate sodium (COLACE) 100 MG capsule Take 100 mg by mouth daily.     . famotidine (PEPCID) 20 MG tablet Take 20 mg by mouth every evening.    . furosemide (LASIX) 80 MG  tablet Take 80 mg by mouth every Tuesday, Thursday, Saturday, and Sunday.    Marland Kitchen guaiFENesin (MUCINEX) 600 MG 12 hr tablet Take 1,200 mg by mouth 2 (two) times daily.    . hydrALAZINE (APRESOLINE) 50 MG tablet Take 50 mg by mouth 2 (two) times daily.    . hydroxypropyl methylcellulose / hypromellose (ISOPTO TEARS / GONIOVISC) 2.5 % ophthalmic solution Place 2 drops into the right eye every 6 (six) hours as needed for dry eyes.    . insulin lispro (HUMALOG) 100 UNIT/ML injection Inject 20 Units into the skin 3 (three) times daily before meals.     . irbesartan (AVAPRO) 150 MG tablet Take 1 tablet (150 mg total) by mouth daily. 30 tablet 0  . isosorbide dinitrate (ISORDIL) 30 MG tablet Take 30 mg by mouth daily.     Marland Kitchen levothyroxine (SYNTHROID, LEVOTHROID) 50 MCG tablet Take 50 mcg by mouth at bedtime.     Marland Kitchen loratadine (CLARITIN) 10 MG tablet Take 10 mg by mouth every other day.    . losartan (COZAAR) 50 MG tablet Take 50 mg by mouth 2 (two) times daily.     . magnesium oxide (MAG-OX) 400 MG tablet Take 400 mg by mouth daily.     . meclizine (ANTIVERT) 25 MG tablet Take 25 mg by mouth every 6 (six) hours as needed for dizziness or nausea.     Marland Kitchen  metoCLOPramide (REGLAN) 10 MG tablet Take 10 mg by mouth at bedtime.    . multivitamin (RENA-VIT) TABS tablet Take 1 tablet by mouth daily.    . mupirocin ointment (BACTROBAN) 2 % Place 1 application into the nose 2 (two) times daily. 22 g 0  . nitroGLYCERIN (NITROSTAT) 0.4 MG SL tablet Place 0.4 mg under the tongue every 5 (five) minutes as needed for chest pain.    Marland Kitchen Phenylephrine-DM-GG (ROBITUSSIN COUGH/COLD CF) 5-10-100 MG/5ML LIQD Take 10 mLs by mouth every 6 (six) hours as needed (for cough).    . polyethylene glycol (MIRALAX / GLYCOLAX) packet Take 17 g by mouth daily as needed for mild constipation.    . predniSONE (DELTASONE) 10 MG tablet Take 10 mg by mouth daily.    . pregabalin (LYRICA) 50 MG capsule Take 50 mg by mouth at bedtime.    Marland Kitchen rOPINIRole  (REQUIP) 0.25 MG tablet Take 0.25 mg by mouth See admin instructions. Pt takes one tablet once a day on Sunday, Tuesday, Thursday, and Saturday.   Pt takes one tablet twice a day on Monday, Wednesday, and Friday.     No current facility-administered medications for this visit.     Past Medical History:  Diagnosis Date  . Atrial fibrillation ([place])   . CAD (coronary artery disease)   . CHF (congestive heart failure) ([place])   . CKD (chronic kidney disease)   . COPD (chronic obstructive pulmonary disease) ([place])   . Diabetes ([place])   . Diabetic peripheral neuropathy associated with type 2 diabetes mellitus ([place])   . Dialysis patient ([place])   . GERD (gastroesophageal reflux disease)   . HTN (hypertension)   . MI (myocardial infarction) 05-17-2014  . Pancreatitis   . Peripheral autonomic neuropathy due to diabetes mellitus [facility])     Past Surgical History:  Procedure Laterality Date  . ABDOMINAL HYSTERECTOMY    . AV FISTULA PLACEMENT Left 12/25/2014   Procedure: ARTERIOVENOUS (AV) FISTULA CREATION;  Surgeon: Algernon Huxley, MD;  Location: [facility] ORS;  Service: Vascular;  Laterality: Left;  . BACK SURGERY    . CHOLECYSTECTOMY    . CORONARY ANGIOPLASTY     Cardiac stents  . EYE SURGERY     bilateral cataract extractions  . LEFT HEART CATHETERIZATION WITH CORONARY ANGIOGRAM N/A 05/20/2014   Procedure: LEFT HEART CATHETERIZATION WITH CORONARY ANGIOGRAM;  Surgeon: Clent Demark, MD;  Location: [place] [facility];  Service: Cardiovascular;  Laterality: N/A;  . PERIPHERAL VASCULAR CATHETERIZATION N/A 02/12/2015   Procedure: A/V Shuntogram/Fistulagram;  Surgeon: Algernon Huxley, MD;  Location: [place] CV LAB;  Service: Cardiovascular;  Laterality: N/A;  . PERIPHERAL VASCULAR CATHETERIZATION N/A 02/12/2015   Procedure: A/V Shunt Intervention;  Surgeon: Algernon Huxley, MD;  Location: [place] CV LAB;  Service: Cardiovascular;  Laterality: N/A;  . PERIPHERAL VASCULAR CATHETERIZATION N/A 04/09/2015    Procedure: Dialysis/Perma Catheter Removal;  Surgeon: Algernon Huxley, MD;  Location: [place] CV LAB;  Service: Cardiovascular;  Laterality: N/A;  . PORTACATH PLACEMENT      Social History Social History  Substance Use Topics  . Smoking status: Never Smoker  . Smokeless tobacco: Never Used  . Alcohol use No     Family History Family History  Problem Relation Age of Onset  . Diabetes Mother   . Hypertension Mother   . Breast cancer Maternal Grandmother   . Breast cancer Paternal Aunt      Allergies  Allergen Reactions  . Adhesive [Tape] Other (See  Comments)    Reaction:  Blisters   . Shellfish Allergy Other (See Comments)    Reaction:  Unknown   . Doxycycline Rash     REVIEW OF SYSTEMS (Negative unless checked)  Constitutional: [] Weight loss  [] Fever  [] Chills Cardiac: [] Chest pain   [] Chest pressure   [] Palpitations   [] Shortness of breath when laying flat   [] Shortness of breath at rest   [] Shortness of breath with exertion. Vascular:  [] Pain in legs with walking   [] Pain in legs at rest   [] Pain in legs when laying flat   [] Claudication   [] Pain in feet when walking  [] Pain in feet at rest  [] Pain in feet when laying flat   [] History of DVT   [] Phlebitis   [] Swelling in legs   [] Varicose veins   [] Non-healing ulcers Pulmonary:   [] Uses home oxygen   [] Productive cough   [] Hemoptysis   [] Wheeze  [] COPD   [] Asthma Neurologic:  [] Dizziness  [] Blackouts   [] Seizures   [] History of stroke   [] History of TIA  [] Aphasia   [] Temporary blindness   [] Dysphagia   [] Weakness or numbness in arms   [x] Weakness or numbness in legs Musculoskeletal:  [x] Arthritis   [] Joint swelling   [] Joint pain   [] Low back pain Hematologic:  [] Easy bruising  [] Easy bleeding   [] Hypercoagulable state   [] Anemic   Gastrointestinal:  [] Blood in stool   [] Vomiting blood  [] Gastroesophageal reflux/heartburn   [] Abdominal pain Genitourinary:  [x] Chronic kidney disease   [] Difficult urination  [] Frequent  urination  [] Burning with urination   [] Hematuria Skin:  [] Rashes   [] Ulcers   [] Wounds Psychological:  [] History of anxiety   []  History of major depression.  Physical Examination  BP 137/68 (BP Location: Right Arm)   Pulse 88   Resp 16   Ht 5\' 1"  (1.549 m)   Wt 177 lb (80.3 kg)   BMI 33.44 kg/m  Gen:  WD/WN, NAD Head: [place]/AT, No temporalis wasting. Ear/Nose/Throat: Hearing grossly intact, nares w/o erythema or drainage, trachea midline Eyes: Conjunctiva clear. Sclera non-icteric Neck: Supple.  No JVD.  Pulmonary:  Good air movement, no use of accessory muscles.  Cardiac: RRR, normal S1, S2 Vascular: good thrill in left arm AVF Vessel Right Left  Radial Palpable Palpable                                   Gastrointestinal: soft, non-tender/non-distended. No guarding/reflex.  Musculoskeletal: M/S 5/5 throughout.  No deformity or atrophy. Walks with a walker Neurologic: Sensation grossly intact in extremities.  Symmetrical.  Speech is fluent.  Psychiatric: Judgment intact, Mood & affect appropriate for pt's clinical situation. Dermatologic: No rashes or ulcers noted.  No cellulitis or open wounds. Lymph : No Cervical, Axillary, or Inguinal lymphadenopathy.      Labs No results found for this or any previous visit (from the past 2160 hour(s)).  Radiology Dg Chest 2 View  Result Date: 08/01/2016 CLINICAL DATA:  Cough and short of breath EXAM: CHEST  2 VIEW COMPARISON:  09/29/2015, 09/13/2014 FINDINGS: Heart size within normal limits. Negative for heart failure or edema. No pleural effusion. Mild bibasilar atelectasis.  Negative for mass lesion. IMPRESSION: Mild bibasilar atelectasis. Electronically Signed   By: Franchot Gallo M.D.   On: 08/01/2016 09:14      Assessment/Plan  Essential hypertension, benign blood pressure control important in reducing the progression of atherosclerotic disease. On appropriate  oral medications.   Hyperlipidemia lipid control  important in reducing the progression of atherosclerotic disease. Continue statin therapy   ESRD on dialysis [facility]) Her duplex today shows some mildly elevated velocities at the access site but no high-grade stenosis in her left brachiocephalic AV fistula. Her access is working well. She has no current issues or problems with it. Plan to recheck this in 6 months since there is some degree of narrowing but not bad enough to warrant intervention at this point. Contact our office with any problems in the interim.    Leotis Pain, MD  08/19/2016 12:15 PM    This note was created with Dragon medical transcription system.  Any errors from dictation are purely unintentional

## 2016-08-19 NOTE — Assessment & Plan Note (Signed)
lipid control important in reducing the progression of atherosclerotic disease. Continue statin therapy  

## 2016-08-19 NOTE — Assessment & Plan Note (Signed)
Her duplex today shows some mildly elevated velocities at the access site but no high-grade stenosis in her left brachiocephalic AV fistula. Her access is working well. She has no current issues or problems with it. Plan to recheck this in 6 months since there is some degree of narrowing but not bad enough to warrant intervention at this point. Contact our office with any problems in the interim.

## 2016-08-19 NOTE — Assessment & Plan Note (Signed)
blood pressure control important in reducing the progression of atherosclerotic disease. On appropriate oral medications.  

## 2016-10-10 ENCOUNTER — Encounter: Payer: Medicare Other | Attending: Surgery | Admitting: Surgery

## 2016-10-10 DIAGNOSIS — I132 Hypertensive heart and chronic kidney disease with heart failure and with stage 5 chronic kidney disease, or end stage renal disease: Secondary | ICD-10-CM | POA: Insufficient documentation

## 2016-10-10 DIAGNOSIS — I4891 Unspecified atrial fibrillation: Secondary | ICD-10-CM | POA: Diagnosis not present

## 2016-10-10 DIAGNOSIS — Z7982 Long term (current) use of aspirin: Secondary | ICD-10-CM | POA: Insufficient documentation

## 2016-10-10 DIAGNOSIS — Z992 Dependence on renal dialysis: Secondary | ICD-10-CM | POA: Insufficient documentation

## 2016-10-10 DIAGNOSIS — E1122 Type 2 diabetes mellitus with diabetic chronic kidney disease: Secondary | ICD-10-CM | POA: Insufficient documentation

## 2016-10-10 DIAGNOSIS — H409 Unspecified glaucoma: Secondary | ICD-10-CM | POA: Diagnosis not present

## 2016-10-10 DIAGNOSIS — M1990 Unspecified osteoarthritis, unspecified site: Secondary | ICD-10-CM | POA: Diagnosis not present

## 2016-10-10 DIAGNOSIS — Z833 Family history of diabetes mellitus: Secondary | ICD-10-CM | POA: Diagnosis not present

## 2016-10-10 DIAGNOSIS — N186 End stage renal disease: Secondary | ICD-10-CM | POA: Insufficient documentation

## 2016-10-10 DIAGNOSIS — C649 Malignant neoplasm of unspecified kidney, except renal pelvis: Secondary | ICD-10-CM | POA: Diagnosis not present

## 2016-10-10 DIAGNOSIS — D649 Anemia, unspecified: Secondary | ICD-10-CM | POA: Diagnosis not present

## 2016-10-10 DIAGNOSIS — E0590 Thyrotoxicosis, unspecified without thyrotoxic crisis or storm: Secondary | ICD-10-CM | POA: Diagnosis not present

## 2016-10-10 DIAGNOSIS — H548 Legal blindness, as defined in USA: Secondary | ICD-10-CM | POA: Diagnosis not present

## 2016-10-10 DIAGNOSIS — Z8249 Family history of ischemic heart disease and other diseases of the circulatory system: Secondary | ICD-10-CM | POA: Diagnosis not present

## 2016-10-10 DIAGNOSIS — Z794 Long term (current) use of insulin: Secondary | ICD-10-CM | POA: Insufficient documentation

## 2016-10-10 DIAGNOSIS — I509 Heart failure, unspecified: Secondary | ICD-10-CM | POA: Insufficient documentation

## 2016-10-10 DIAGNOSIS — L97312 Non-pressure chronic ulcer of right ankle with fat layer exposed: Secondary | ICD-10-CM | POA: Insufficient documentation

## 2016-10-10 DIAGNOSIS — I2510 Atherosclerotic heart disease of native coronary artery without angina pectoris: Secondary | ICD-10-CM | POA: Insufficient documentation

## 2016-10-10 DIAGNOSIS — E785 Hyperlipidemia, unspecified: Secondary | ICD-10-CM | POA: Insufficient documentation

## 2016-10-10 DIAGNOSIS — E1151 Type 2 diabetes mellitus with diabetic peripheral angiopathy without gangrene: Secondary | ICD-10-CM | POA: Insufficient documentation

## 2016-10-10 DIAGNOSIS — Z809 Family history of malignant neoplasm, unspecified: Secondary | ICD-10-CM | POA: Insufficient documentation

## 2016-10-10 DIAGNOSIS — E11621 Type 2 diabetes mellitus with foot ulcer: Secondary | ICD-10-CM | POA: Insufficient documentation

## 2016-10-10 DIAGNOSIS — Z79899 Other long term (current) drug therapy: Secondary | ICD-10-CM | POA: Insufficient documentation

## 2016-10-11 NOTE — Progress Notes (Signed)
TAYANNA, TALFORD ([id number]) Visit Report for 10/10/2016 Allergy List Details Patient Name: SANYA, KOBRIN. Date of Service: 10/10/2016 8:00 AM Medical Record Number: [id number] Patient Account Number: [id number] Date of Birth/Sex: 11/24/1955 (61 y.o. Female) Treating RN: Montey Hora Primary Care Mackinze Criado: [name] Other Clinician: Referring Zurie Platas: [name] Treating Salma Walrond/Extender: Frann Rider in Treatment: 0 Allergies Active Allergies No Known Allergies Allergy Notes Electronic Signature(s) Signed: 10/10/2016 4:57:21 PM By: Montey Hora Entered By: Montey Hora on 10/10/2016 08:19:46 Canales, [name] ([id number]) -------------------------------------------------------------------------------- Arrival Information Details Patient Name: [name] Date of Service: 10/10/2016 8:00 AM Medical Record Number: [id number] Patient Account Number: [id number] Date of Birth/Sex: Mar 14, 1956 (61 y.o. Female) Treating RN: Montey Hora Primary Care Quynh Basso: [name] Other Clinician: Referring Mohsen Odenthal: [name] Treating Anshu Wehner/Extender: Frann Rider in Treatment: 0 Visit Information Patient Arrived: Walker Arrival Time: 08:18 Accompanied By: self Transfer Assistance: None Patient Identification Verified: Yes Secondary Verification Process Yes Completed: Patient Has Alerts: Yes Patient Alerts: Patient on Blood Thinner History Since Last Visit Added or deleted any medications: No Any new allergies or adverse reactions: No Had a fall or experienced change in activities of daily living that may affect risk of falls: No Signs or symptoms of abuse/neglect since last visito No Hospitalized since last visit: No Has Dressing in Place as Prescribed: Yes Electronic Signature(s) Signed: 10/10/2016 4:57:21 PM By: Montey Hora Entered By: Montey Hora on 10/10/2016 08:23:41 Lockamy, [name]  ([id number]) -------------------------------------------------------------------------------- Clinic Level of Care Assessment Details Patient Name: [name] Date of Service: 10/10/2016 8:00 AM Medical Record Number: [id number] Patient Account Number: [id number] Date of Birth/Sex: 1955/11/16 (61 y.o. Female) Treating RN: Montey Hora Primary Care Curby Carswell: [name] Other Clinician: Referring Loveda Colaizzi: [name] Treating Narmeen Kerper/Extender: Frann Rider in Treatment: 0 Clinic Level of Care Assessment Items TOOL 1 Quantity Score []  - Use when EandM and Procedure is performed on INITIAL visit 0 ASSESSMENTS - Nursing Assessment / Reassessment X - General Physical Exam (combine w/ comprehensive assessment (listed just 1 20 below) when performed on new pt. evals) X - Comprehensive Assessment (HX, ROS, Risk Assessments, Wounds Hx, etc.) 1 25 ASSESSMENTS - Wound and Skin Assessment / Reassessment []  - Dermatologic / Skin Assessment (not related to wound area) 0 ASSESSMENTS - Ostomy and/or Continence Assessment and Care []  - Incontinence Assessment and Management 0 []  - Ostomy Care Assessment and Management (repouching, etc.) 0 PROCESS - Coordination of Care X - Simple Patient / Family Education for ongoing care 1 15 []  - Complex (extensive) Patient / Family Education for ongoing care 0 X - Staff obtains Programmer, systems, Records, Test Results / Process Orders 1 10 []  - Staff telephones HHA, Nursing Homes / Clarify orders / etc 0 []  - Routine Transfer to another Facility (non-emergent condition) 0 []  - Routine Hospital Admission (non-emergent condition) 0 X - New Admissions / Biomedical engineer / Ordering NPWT, Apligraf, etc. 1 15 []  - Emergency Hospital Admission (emergent condition) 0 PROCESS - Special Needs []  - Pediatric / Minor Patient Management 0 []  - Isolation Patient Management 0 Blackshire, Erlinda S. ([id number]) []  - Hearing / Language / Visual special needs 0 []  -  Assessment of Community assistance (transportation, D/C planning, etc.) 0 []  - Additional assistance / Altered mentation 0 []  - Support Surface(s) Assessment (bed, cushion, seat, etc.) 0 INTERVENTIONS - Miscellaneous []  - External ear exam 0 []  - Patient Transfer (multiple staff / Civil Service fast streamer / Similar devices) 0 []  - Simple Staple / Suture removal (  25 or less) 0 []  - Complex Staple / Suture removal (26 or more) 0 []  - Hypo/Hyperglycemic Management (do not check if billed separately) 0 X - Ankle / Brachial Index (ABI) - do not check if billed separately 1 15 Has the patient been seen at the hospital within the last three years: Yes Total Score: 100 Level Of Care: New/Established - Level 3 Electronic Signature(s) Signed: 10/10/2016 4:57:21 PM By: Montey Hora Entered By: Montey Hora on 10/10/2016 09:59:22 Reffett, [name] ([id number]) -------------------------------------------------------------------------------- Encounter Discharge Information Details Patient Name: WUJWJ, Lailani S. Date of Service: 10/10/2016 8:00 AM Medical Record Number: [id number] Patient Account Number: [id number] Date of Birth/Sex: 24-Jan-1956 (61 y.o. Female) Treating RN: Montey Hora Primary Care Shamone Winzer: [name] Other Clinician: Referring Kalie Cabral: [name] Treating Channell Quattrone/Extender: Frann Rider in Treatment: 0 Encounter Discharge Information Items Discharge Pain Level: 0 Discharge Condition: Stable Ambulatory Status: Walker Discharge Destination: Nursing Home Transportation: Private Auto Accompanied By: self Schedule Follow-up Appointment: Yes Medication Reconciliation completed No and provided to Patient/Care Legaci Tarman: Provided on Clinical Summary of Care: 10/10/2016 Form Type Recipient Paper Patient LD Electronic Signature(s) Signed: 10/10/2016 10:00:14 AM By: Montey Hora Previous Signature: 10/10/2016 9:11:59 AM Version By: Ruthine Dose Entered By: Montey Hora on  10/10/2016 10:00:13 Lager, [name] ([id number]) -------------------------------------------------------------------------------- Lower Extremity Assessment Details Patient Name: Arango, Achsah S. Date of Service: 10/10/2016 8:00 AM Medical Record Number: [id number] Patient Account Number: [id number] Date of Birth/Sex: [birth year] (61 y.o. Female) Treating RN: Montey Hora Primary Care Susi Goslin: [name] Other Clinician: Referring Becki Mccaskill: [name] Treating Aliz Meritt/Extender: Frann Rider in Treatment: 0 Edema Assessment Assessed: [Left: No] [Right: No] Edema: [Left: N] [Right: o] Calf Left: Right: Point of Measurement: 32 cm From Medial Instep cm 32.1 cm Ankle Left: Right: Point of Measurement: 11 cm From Medial Instep cm 20.2 cm Vascular Assessment Pulses: Dorsalis Pedis Palpable: [Right:Yes] Doppler Audible: [Right:Yes] Posterior Tibial Palpable: [Right:Yes] Doppler Audible: [Right:Yes] Extremity colors, hair growth, and conditions: Extremity Color: [Right:Normal] Hair Growth on Extremity: [Right:No] Temperature of Extremity: [Right:Warm] Capillary Refill: [Right:< 3 seconds] Blood Pressure: Brachial: [Right:176] Dorsalis Pedis: [Left:Dorsalis Pedis: 168] Ankle: Posterior Tibial: [Left:Posterior Tibial: 182] [Right:1.03] Toe Nail Assessment Left: Right: Thick: No Discolored: No Deformed: No Improper Length and Hygiene: No ZIENNA, AHLIN ([id number]) Electronic Signature(s) Signed: 10/10/2016 4:57:21 PM By: Montey Hora Entered By: Montey Hora on 10/10/2016 08:43:01 Cooter, [name] ([id number]) -------------------------------------------------------------------------------- Multi Wound Chart Details Patient Name: QIHKV, Camilia S. Date of Service: 10/10/2016 8:00 AM Medical Record Number: [id number] Patient Account Number: [id number] Date of Birth/Sex: 06/17/1956 (61 y.o. Female) Treating RN: Montey Hora Primary Care Mazin Emma: [name] Other Clinician: Referring Johney Perotti: [name] Treating Jerrod Damiano/Extender: Frann Rider in Treatment: 0 Vital Signs Height(in): 61 Pulse(bpm): 77 Weight(lbs): 160 Blood Pressure 176/78 (mmHg): Body Mass Index(BMI): 30 Temperature(F): 98.1 Respiratory Rate 18 (breaths/min): Photos: [10:No Photos] [N/A:N/A] Wound Location: [10:Right Malleolus - Lateral N/A] Wounding Event: [10:Trauma] [N/A:N/A] Primary Etiology: [10:Trauma, Other] [N/A:N/A] Comorbid History: [10:Glaucoma, Anemia, Coronary Artery Disease, Hypertension, Type II Diabetes, End Stage Renal Disease, Osteoarthritis, Neuropathy] [N/A:N/A] Date Acquired: [10:09/12/2016] [N/A:N/A] Weeks of Treatment: [10:0] [N/A:N/A] Wound Status: [10:Open] [N/A:N/A] Measurements L x W x D 0.6x0.6x0.2 [N/A:N/A] (cm) Area (cm) : [10:0.283] [N/A:N/A] Volume (cm) : [10:0.057] [N/A:N/A] Classification: [10:Full Thickness Without Exposed Support Structures] [N/A:N/A] HBO Classification: [10:Grade 1] [N/A:N/A] Exudate Amount: [10:Large] [N/A:N/A] Exudate Type: [10:Serous] [N/A:N/A] Exudate Color: [10:amber] [N/A:N/A] Wound Margin: [10:Flat and Intact] [N/A:N/A] Granulation Amount: [10:None Present (0%)] [N/A:N/A] Necrotic Amount: [10:Large (67-100%)] [N/A:N/A] Necrotic  Tissue: [10:Eschar, Adherent Slough N/A] Exposed Structures: [10:Fascia: No Fat Layer (Subcutaneous] [N/A:N/A] Tissue) Exposed: No Tendon: No Muscle: No Joint: No Bone: No Limited to Skin Breakdown Epithelialization: None N/A N/A Debridement: Debridement ([id number]- N/A N/A 11047) Pre-procedure 08:54 N/A N/A Verification/Time Out Taken: Pain Control: Lidocaine 4% Topical N/A N/A Solution Tissue Debrided: Fibrin/Slough, N/A N/A Subcutaneous Level: Skin/Subcutaneous N/A N/A Tissue Debridement Area (sq 0.36 N/A N/A cm): Instrument: Blade, Forceps N/A N/A Bleeding: Minimum N/A N/A Hemostasis Achieved: Pressure N/A N/A Procedural Pain: 0 N/A  N/A Post Procedural Pain: 0 N/A N/A Debridement Treatment Procedure was tolerated N/A N/A Response: well Post Debridement 0.6x0.6x0.2 N/A N/A Measurements L x W x D (cm) Post Debridement 0.057 N/A N/A Volume: (cm) Periwound Skin Texture: Excoriation: No N/A N/A Induration: No Callus: No Crepitus: No Rash: No Scarring: No Periwound Skin Maceration: No N/A N/A Moisture: Dry/Scaly: No Periwound Skin Color: Erythema: Yes N/A N/A Atrophie Blanche: No Cyanosis: No Ecchymosis: No Hemosiderin Staining: No Mottled: No Pallor: No Rubor: No Erythema Location: Circumferential N/A N/A Stockard, Katelind S. ([id number]) Temperature: No Abnormality N/A N/A Tenderness on Yes N/A N/A Palpation: Wound Preparation: Ulcer Cleansing: N/A N/A Rinsed/Irrigated with Saline Topical Anesthetic Applied: Other: lidocaine 4% Procedures Performed: Debridement N/A N/A Treatment Notes Electronic Signature(s) Signed: 10/10/2016 9:01:30 AM By: [name], [name] Entered By: [name] on 10/10/2016 09:01:30 Baumgarner, [name] ([id number]) -------------------------------------------------------------------------------- Page Details Patient Name: DESSIRE, GRIMES. Date of Service: 10/10/2016 8:00 AM Medical Record Number: [id number] Patient Account Number: [id number] Date of Birth/Sex: 1956-04-12 (61 y.o. Female) Treating RN: Montey Hora Primary Care Roza Creamer: [name] Other Clinician: Referring Clifford Benninger: [name] Treating Ember Henrikson/Extender: Frann Rider in Treatment: 0 Active Inactive ` Abuse / Safety / Falls / Self Care Management Nursing Diagnoses: Potential for falls Goals: Patient will remain injury free Date Initiated: 10/10/2016 Target Resolution Date: 12/23/2016 Goal Status: Active Interventions: Assess fall risk on admission and as needed Notes: ` Nutrition Nursing Diagnoses: Potential for alteratiion in Nutrition/Potential for imbalanced  nutrition Goals: Patient/caregiver agrees to and verbalizes understanding of need to use nutritional supplements and/or vitamins as prescribed Date Initiated: 10/10/2016 Target Resolution Date: 10/21/2016 Goal Status: Active Interventions: Assess patient nutrition upon admission and as needed per policy Notes: ` Orientation to the Wound Care Program Nursing Diagnoses: Knowledge deficit related to the wound healing center program Trettin, ONETA SIGMAN ([id number]) Goals: Patient/caregiver will verbalize understanding of the [place] Date Initiated: 10/10/2016 Target Resolution Date: 12/23/2016 Goal Status: Active Interventions: Provide education on orientation to the wound center Notes: ` Wound/Skin Impairment Nursing Diagnoses: Impaired tissue integrity Goals: Patient/caregiver will verbalize understanding of skin care regimen Date Initiated: 10/10/2016 Target Resolution Date: 12/23/2016 Goal Status: Active Ulcer/skin breakdown will have a volume reduction of 30% by week 4 Date Initiated: 10/10/2016 Target Resolution Date: 12/23/2016 Goal Status: Active Ulcer/skin breakdown will have a volume reduction of 50% by week 8 Date Initiated: 10/10/2016 Target Resolution Date: 12/23/2016 Goal Status: Active Ulcer/skin breakdown will have a volume reduction of 80% by week 12 Date Initiated: 10/10/2016 Target Resolution Date: 12/23/2016 Goal Status: Active Ulcer/skin breakdown will heal within 14 weeks Date Initiated: 10/10/2016 Target Resolution Date: 12/23/2016 Goal Status: Active Interventions: Assess patient/caregiver ability to obtain necessary supplies Assess patient/caregiver ability to perform ulcer/skin care regimen upon admission and as needed Assess ulceration(s) every visit Notes: Electronic Signature(s) Signed: 10/10/2016 4:57:21 PM By: Montey Hora Entered By: Montey Hora on 10/10/2016 08:56:04 Corniel, Carolin S.  ([id number]) -------------------------------------------------------------------------------- Pain Assessment  Details Patient Name: DANAYSIA, RADER. Date of Service: 10/10/2016 8:00 AM Medical Record Number: [id number] Patient Account Number: [id number] Date of Birth/Sex: [birth year] (61 y.o. Female) Treating RN: Montey Hora Primary Care Dannis Deroche: [name] Other Clinician: Referring Yasaman Kolek: [name] Treating Yoshi Mancillas/Extender: Frann Rider in Treatment: 0 Active Problems Location of Pain Severity and Description of Pain Patient Has Paino No Site Locations Pain Management and Medication Current Pain Management: Notes Topical or injectable lidocaine is offered to patient for acute pain when surgical debridement is performed. If needed, Patient is instructed to use over the counter pain medication for the following 24-48 hours after debridement. Wound care MDs do not prescribed pain medications. Patient has chronic pain or uncontrolled pain. Patient has been instructed to make an appointment with their Primary Care Physician for pain management. Electronic Signature(s) Signed: 10/10/2016 4:57:21 PM By: Montey Hora Entered By: Montey Hora on 10/10/2016 08:19:28 Legault, [name] ([id number]) -------------------------------------------------------------------------------- Patient/Caregiver Education Details Patient Name: [name] Date of Service: 10/10/2016 8:00 AM Medical Record Number: [id number] Patient Account Number: [id number] Date of Birth/Gender: 09/21/1955 (61 y.o. Female) Treating RN: Montey Hora Primary Care Physician: [name] Other Clinician: Referring Physician: [name] Treating Physician/Extender: Frann Rider in Treatment: 0 Education Assessment Education Provided To: Patient Education Topics Provided Wound/Skin Impairment: Handouts: Other: wound care and xray as ordered Methods: Explain/Verbal Responses: State  content correctly Electronic Signature(s) Signed: 10/10/2016 4:57:21 PM By: Montey Hora Entered By: Montey Hora on 10/10/2016 10:00:35 Shutter, [name] ([id number]) -------------------------------------------------------------------------------- Wound Assessment Details Patient Name: Gusler, Dawna S. Date of Service: 10/10/2016 8:00 AM Medical Record Number: [id number] Patient Account Number: [id number] Date of Birth/Sex: [birth year] (61 y.o. Female) Treating RN: Montey Hora Primary Care Cesareo Vickrey: [name] Other Clinician: Referring Estalene Bergey: [name] Treating Jil Penland/Extender: Frann Rider in Treatment: 0 Wound Status Wound Number: 10 Primary Trauma, Other Etiology: Wound Location: Right Malleolus - Lateral Wound Open Wounding Event: Trauma Status: Date Acquired: 09/12/2016 Comorbid Glaucoma, Anemia, Coronary Artery Weeks Of Treatment: 0 History: Disease, Hypertension, Type II Clustered Wound: No Diabetes, End Stage Renal Disease, Osteoarthritis, Neuropathy Photos Photo Uploaded By: Montey Hora on 10/10/2016 09:36:32 Wound Measurements Length: (cm) 0.6 Width: (cm) 0.6 Depth: (cm) 0.2 Area: (cm) 0.283 Volume: (cm) 0.057 % Reduction in Area: % Reduction in Volume: Epithelialization: None Tunneling: No Undermining: No Wound Description Full Thickness Without Classification: Exposed Support Structures Diabetic Severity Grade 1 (Wagner): Wound Margin: Flat and Intact Exudate Amount: Large Exudate Type: Serous Exudate Color: amber Foul Odor After Cleansing: No Slough/Fibrino Yes Wound Bed Romberger, Taetum S. ([id number]) Granulation Amount: None Present (0%) Exposed Structure Necrotic Amount: Large (67-100%) Fascia Exposed: No Necrotic Quality: Eschar, Adherent Slough Fat Layer (Subcutaneous Tissue) Exposed: No Tendon Exposed: No Muscle Exposed: No Joint Exposed: No Bone Exposed: No Limited to Skin Breakdown Periwound Skin  Texture Texture Color No Abnormalities Noted: No No Abnormalities Noted: No Callus: No Atrophie Blanche: No Crepitus: No Cyanosis: No Excoriation: No Ecchymosis: No Induration: No Erythema: Yes Rash: No Erythema Location: Circumferential Scarring: No Hemosiderin Staining: No Mottled: No Moisture Pallor: No No Abnormalities Noted: No Rubor: No Dry / Scaly: No Maceration: No Temperature / Pain Temperature: No Abnormality Tenderness on Palpation: Yes Wound Preparation Ulcer Cleansing: Rinsed/Irrigated with Saline Topical Anesthetic Applied: Other: lidocaine 4%, Treatment Notes Wound #10 (Right, Lateral Malleolus) 1. Cleansed with: Clean wound with Normal Saline 2. Anesthetic Topical Lidocaine 4% cream to wound bed prior to debridement 4. Dressing Applied: Santyl Ointment 5. Secondary Dressing Applied Bordered  Foam Dressing Dry Gauze Electronic Signature(s) Signed: 10/10/2016 4:57:21 PM By: Montey Hora Entered By: Montey Hora on 10/10/2016 08:35:06 Biagini, [name] ([id number]) -------------------------------------------------------------------------------- Vitals Details Patient Name: [name] Date of Service: 10/10/2016 8:00 AM Medical Record Number: [id number] Patient Account Number: [id number] Date of Birth/Sex: 1955-11-19 (61 y.o. Female) Treating RN: Montey Hora Primary Care Vernona Peake: [name] Other Clinician: Referring Abegail Kloeppel: [name] Treating Keeshia Sanderlin/Extender: Frann Rider in Treatment: 0 Vital Signs Time Taken: 08:23 Temperature (F): 98.1 Height (in): 61 Pulse (bpm): 77 Source: Measured Respiratory Rate (breaths/min): 18 Weight (lbs): 160 Blood Pressure (mmHg): 176/78 Source: Measured Reference Range: 80 - 120 mg / dl Body Mass Index (BMI): 30.2 Electronic Signature(s) Signed: 10/10/2016 4:57:21 PM By: Montey Hora Entered By: Montey Hora on 10/10/2016 08:25:38

## 2016-10-11 NOTE — Progress Notes (Signed)
[name], [name] ([id number]) Visit Report for 10/10/2016 Chief Complaint Document Details [name], [name]. Date of Service: 10/10/2016 8:00 AM Medical Record Number: [id number] Patient Account Number: [id number] Date of Birth/Sex: [birth year] (61 y.o. Female) Treating RN: Montey Hora Primary Care Provider: [name] Other Clinician: Referring Provider: [name] Treating Provider/Extender: Frann Rider in Treatment: 0 Information Obtained from: Patient Chief Complaint Patients presents for treatment of an open diabetic ulcer right lateral ankle which she's had for about a month Electronic Signature(s) Signed: 10/10/2016 9:02:15 AM By: [name], [name] Entered By: [name] on 10/10/2016 09:02:15 Werntz, [name] ([id number]) -------------------------------------------------------------------------------- Debridement Details [name]. Date of Service: 10/10/2016 8:00 AM Medical Record Number: [id number] Patient Account Number: [id number] Date of Birth/Sex: Nov 08, 1955 (61 y.o. Female) Treating RN: Montey Hora Primary Care Provider: [name] Other Clinician: Referring Provider: [name] Treating Provider/Extender: Frann Rider in Treatment: 0 Debridement Performed for Wound #10 Right,Lateral Malleolus Assessment: Performed By: Physician [name], MD Debridement: Debridement Pre-procedure Yes - 08:54 Verification/Time Out Taken: Start Time: 08:54 Pain Control: Lidocaine 4% Topical Solution Level: Skin/Subcutaneous Tissue Total Area Debrided (L x 0.6 (cm) x 0.6 (cm) = 0.36 (cm) W): Tissue and other Viable, Non-Viable, Fibrin/Slough, Subcutaneous material debrided: Instrument: Blade, Forceps Bleeding: Minimum Hemostasis Achieved: Pressure End Time: 08:56 Procedural Pain: 0 Post Procedural Pain: 0 Response to Treatment: Procedure was tolerated well Post Debridement Measurements of Total  Wound Length: (cm) 0.6 Width: (cm) 0.6 Depth: (cm) 0.2 Volume: (cm) 0.057 Character of Wound/Ulcer Post Improved Debridement: Severity of Tissue Post Debridement: Fat layer exposed Post Procedure Diagnosis Same as Pre-procedure Electronic Signature(s) Signed: 10/10/2016 9:01:43 AM By: [name], [name] Signed: 10/10/2016 4:57:21 PM By: Montey Hora Entered By: [name] on 10/10/2016 09:01:43 Rafanan, [name] ([id number]) Eckmann, Vernita S. ([id number]) -------------------------------------------------------------------------------- HPI Details Patient Name: [name], [name]. Date of Service: 10/10/2016 8:00 AM Medical Record Number: [id number] Patient Account Number: [id number] Date of Birth/Sex: Dec 12, 1955 (61 y.o. Female) Treating RN: Montey Hora Primary Care Provider: [name] Other Clinician: Referring Provider: [name] Treating Provider/Extender: Frann Rider in Treatment: 0 History of Present Illness Location: right lateral ankle Quality: Patient reports No Pain. Severity: Patient states wound (s) are getting better. Duration: Patient has had the wound for > 1 months prior to seeking treatment at the wound center Context: The wound appeared gradually over time, and she thinks she might have hit it with a walker Modifying Factors: Patient is currently on renal dialysis and receives treatments 3 times weekly HPI Description: 61 year old patient known to our practice from several previous visits for various problems now comes back with an ulcerated area on her right lateral malleolus which she's had for about a month and she thinks she may have hit it with her walker. No other problems and no x-ray of this has been taken. She has a past medical history of atrial fibrillation, carotid artery disease, CHF, chronic kidney disease on hemodialysis with a functioning AV fistula seen by Dr. Corene Cornea dew, diabetes mellitus with peripheral neuropathy,  pancreatitis and is status post abdominal hysterectomy, AV fistula placement in June 2016, back surgery, cholecystectomy, coronary angioplasty,several surgeries for AV fistula, and dialysis catheters. She has never been a smoker. ===== Old notes: 61 year old patient who is known to be a diabetic and has end-stage renal disease has had several comorbidities including coronary artery disease, hypertension, hyperlipidemia, pancreatitis, anemia, previous history of hysterectomy, cholecystectomy, left-sided salivary gland excision, bilateral cataract surgery,Peritoneal  dialysis catheter, hemodialysis catheter. the area on the back has also been caused by instant pressure she used to sleep on a recliner all day and has significant kyphoscoliosis. As far as the wound on her right lower extremity she's not sure how this blister occurred but she thought it has been there for about 2 weeks. No recent blood investigations available and no recent hemoglobin A1c. 12/30/2014 -- she is an assisted living facility but I believe the nurses that have not followed instructions as she had some cream applied on her back and there was a different dressing. Last week she's had a AV fistula placed on her left forearm. 01/13/2015 -- she has had some localized infection at the port site and she's been on doxycycline for this. 02/05/2015 - he has developed a small blister on her right lower extremity. 02/10/2015 -- she has developed another small blister on her right anterior chest wall in the area where she's had tape for her dialysis access. this may just be injury caused by a tape burn. She had a dermatology opinion and they have taken a biopsy of her skin. She also had a left brachial AV fistula placed this week. ========= [name], [name] ([id number]) Electronic Signature(s) Signed: 10/10/2016 9:02:21 AM By: [name], [name] Previous Signature: 10/10/2016 9:00:07 AM Version By: [name],  [name] Previous Signature: 10/10/2016 8:52:07 AM Version By: [name], [name] Previous Signature: 10/10/2016 8:42:55 AM Version By: [name], [name] Entered By: [name] on 10/10/2016 09:02:21 Brue, [name] ([id number]) -------------------------------------------------------------------------------- Physical Exam Details Patient Name: Diprima, 70. Date of Service: 10/10/2016 8:00 AM Medical Record Number: [id number] Patient Account Number: [id number] Date of Birth/Sex: [birth year] (61 y.o. Female) Treating RN: Montey Hora Primary Care Provider: [name] Other Clinician: Referring Provider: [name] Treating Provider/Extender: Frann Rider in Treatment: 0 Constitutional . Pulse regular. Respirations normal and unlabored. Afebrile. . Eyes Nonicteric. Reactive to light. Ears, Nose, Mouth, and Throat Lips, teeth, and gums WNL.Marland Kitchen Moist mucosa without lesions. Neck supple and nontender. No palpable supraclavicular or cervical adenopathy. Normal sized without goiter. Respiratory WNL. No retractions.. Cardiovascular Pedal Pulses WNL. ABI on the right is 1.03. No clubbing, cyanosis or edema. Gastrointestinal (GI) Abdomen without masses or tenderness.. No liver or spleen enlargement or tenderness.. Lymphatic No adneopathy. No adenopathy. No adenopathy. Musculoskeletal Adexa without tenderness or enlargement.. Digits and nails w/o clubbing, cyanosis, infection, petechiae, ischemia, or inflammatory conditions.. Integumentary (Hair, Skin) No suspicious lesions. No crepitus or fluctuance. No peri-wound warmth or erythema. No masses.Marland Kitchen Psychiatric Judgement and insight Intact.. No evidence of depression, anxiety, or agitation.. Notes the patient has a ulcerated area on the right ankle surrounded by a mild lichenification of the skin. Subcutaneous slough which was sharply debrided with a forceps and scissors and there was no bleeding. Electronic  Signature(s) Signed: 10/10/2016 9:03:31 AM By: [name], [name] Entered By: [name] on 10/10/2016 09:03:30 Mayeda, [name] ([id number]) -------------------------------------------------------------------------------- Physician Orders Details [name]. Date of Service: 10/10/2016 8:00 AM Medical Record Number: [id number] Patient Account Number: [id number] Date of Birth/Sex: 23-Jun-1956 (61 y.o. Female) Treating RN: Montey Hora Primary Care Provider: [name] Other Clinician: Referring Provider: [name] Treating Provider/Extender: Frann Rider in Treatment: 0 Verbal / Phone Orders: No Diagnosis Coding Wound Cleansing Wound #10 Right,Lateral Malleolus o Clean wound with Normal Saline. o May Shower, gently pat wound dry prior to applying new dressing. Anesthetic Wound #10 Right,Lateral Malleolus o Topical Lidocaine 4% cream  applied to wound bed prior to debridement Primary Wound Dressing Wound #10 Right,Lateral Malleolus o Santyl Ointment Secondary Dressing Wound #10 Right,Lateral Malleolus o Dry Gauze o Boardered Foam Dressing Dressing Change Frequency Wound #10 Right,Lateral Malleolus o Change dressing every day. Follow-up Appointments Wound #10 Right,Lateral Malleolus o Return Appointment in 1 week. Edema Control Wound #10 Right,Lateral Malleolus o Patient to wear own compression stockings Medications-please add to medication list. Wound #10 Right,Lateral Malleolus o Santyl Enzymatic Ointment Langdon, [name] ([id number]) Radiology o X-ray, ankle - [place] to obtain mobile xray of right ankle and fax results to [facility] at [phone] Electronic Signature(s) Signed: 10/10/2016 4:09:48 PM By: [name], [name] Signed: 10/10/2016 4:57:21 PM By: Montey Hora Entered By: Montey Hora on 10/10/2016 09:00:16 Mensch, [name]  ([id number]) -------------------------------------------------------------------------------- Problem List Details Patient Name: Rolston, Anaid S. Date of Service: 10/10/2016 8:00 AM Medical Record Number: [id number] Patient Account Number: [id number] Date of Birth/Sex: [birth year] (61 y.o. 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 Date of Service: 10/10/2016 8:00 AM Medical Record Number: [id number] Patient Account Number: [id number] Date of Birth/Sex: 08-May-1956 (61 y.o. Female) Treating RN: Montey Hora Primary Care Provider: [name] Other Clinician: Referring Provider: [name] Treating Provider/Extender: Frann Rider in Treatment: 0 Subjective Chief Complaint Information obtained from Patient Patients presents for treatment of an open diabetic ulcer right lateral ankle which she's had for about a month History of Present Illness (HPI) The following HPI elements were documented for the patient's wound: Location: right lateral ankle Quality: Patient reports No Pain. Severity: Patient states wound (s) are getting better. Duration: Patient has had the wound for > 1 months prior to  seeking treatment at the wound center Context: The wound appeared gradually over time, and she thinks she might have hit it with a walker Modifying Factors: Patient is currently on renal dialysis and receives treatments 3 times weekly 61 year old patient known to our practice from several previous visits for various problems now comes back with an ulcerated area on her right lateral malleolus which she's had for about a month and she thinks she may have hit it with her walker. No other problems and no x-ray of this has been taken. She has a past medical history of atrial fibrillation, carotid artery disease, CHF, chronic kidney disease on hemodialysis with a functioning AV fistula seen by Dr. Corene Cornea dew, diabetes mellitus with peripheral neuropathy, pancreatitis and is status post abdominal hysterectomy, AV fistula placement in June 2016, back surgery, cholecystectomy, coronary angioplasty,several surgeries for AV fistula, and dialysis catheters. She has never been a smoker. ===== Old notes: 61 year old patient who is known to be a diabetic and has end-stage renal disease has had several comorbidities including coronary artery disease, hypertension, hyperlipidemia, pancreatitis, anemia, previous history of hysterectomy, cholecystectomy, left-sided salivary gland excision, bilateral cataract surgery,Peritoneal dialysis catheter, hemodialysis catheter. the area on the back has also been caused by  instant pressure she used to sleep on a recliner all day and has significant kyphoscoliosis. As far as the wound on her right lower extremity she's not sure how this blister occurred but she thought it has been there for about 2 weeks. No recent blood investigations available and no recent hemoglobin A1c. 12/30/2014 -- she is an assisted living facility but I believe the nurses that have not followed instructions as she had some cream applied on her back and there was a different dressing. Last week  she's had a AV fistula placed on her left forearm. AALIYAH, GAVEL ([id number]) 01/13/2015 -- she has had some localized infection at the port site and she's been on doxycycline for this. 02/05/2015 - he has developed a small blister on her right lower extremity. 02/10/2015 -- she has developed another small blister on her right anterior chest wall in the area where she's had tape for her dialysis access. this may just be injury caused by a tape burn. She had a dermatology opinion and they have taken a biopsy of her skin. She also had a left brachial AV fistula placed this week. ========= Wound History Patient presents with 1 open wound that has been present for approximately 1 month. Patient has been treating wound in the following manner: bandage with TAO. Laboratory tests have not been performed in the last month. Patient reportedly has not tested positive for an antibiotic resistant organism. Patient reportedly has not tested positive for osteomyelitis. Patient reportedly has not had testing performed to evaluate circulation in the legs. Patient History Information obtained from Patient. Allergies No Known Allergies Family History Cancer - Father, Mother, Diabetes - Father, Mother, Heart Disease - Mother, Hypertension - Mother, Father, Lung Disease - Mother, No family history of Hereditary Spherocytosis, Kidney Disease, Seizures, Stroke, Thyroid Problems, Tuberculosis. Social History Never smoker, Marital Status - Married, Alcohol Use - Never, Drug Use - No History, Caffeine Use - Daily - tea, cofee. Medical And Surgical History Notes Eyes legally blind Cardiovascular 2 stents placed by Dr. Humphrey Rolls, but one is already blocked per patient Gastrointestinal hyperthyroidsm Oncologic renal cancer, froze it, heat it and froze again. but it has come back per patient. Plan: to take kidney out still waiting on decision. SCARLETH, BRAME ([id number]) Medications carvedilol 6.25 mg tablet  oral tablet oral acetaminophen 325 mg tablet oral tablet oral Lyrica 50 mg capsule oral capsule oral meclizine 25 mg tablet oral tablet oral as needed loratadine 10 mg tablet oral tablet oral atorvastatin 20 mg tablet oral tablet oral irbesartan 150 mg tablet oral tablet oral losartan 50 mg tablet oral tablet oral hydralazine 50 mg tablet oral tablet oral ropinirole 0.25 mg tablet oral tablet oral ProAir RespiClick 90 mcg/actuation breath activated inhalation aerosol powdr breath activated inhalation calcium acetate 667 mg capsule oral capsule oral guaifenesin ER 600 mg tablet, extended release 12 hr oral tablet extended release 12hr oral prednisone 10 mg tablet oral tablet oral famotidine 20 mg tablet oral tablet oral as needed Humalog U-100 Insulin 100 unit/mL subcutaneous solution subcutaneous solution subcutaneous metoclopramide 10 mg tablet oral tablet oral docusate sodium 100 mg capsule oral capsule oral furosemide 80 mg tablet oral tablet oral magnesium oxide 400 mg tablet oral tablet oral Robitussin Cough and Cold CF 2.5 mg-5 mg-50 mg/5 mL oral liquid oral liquid oral as needed aspirin 81 mg tablet,delayed release oral tablet,delayed release (DR/EC) oral clopidogrel 75 mg tablet oral tablet oral levothyroxine 50 mcg tablet oral tablet oral isosorbide dinitrate 30 mg tablet  oral tablet oral Nitrostat 0.4 mg sublingual tablet sublingual tablet, sublingual sublingual as needed Rena-Vite 0.8 mg tablet oral tablet oral Objective Constitutional Pulse regular. Respirations normal and unlabored. Afebrile. Vitals Time Taken: 8:23 AM, Height: 61 in, Source: Measured, Weight: 160 lbs, Source: Measured, BMI: 30.2, Temperature: 98.1 F, Pulse: 77 bpm, Respiratory Rate: 18 breaths/min, Blood Pressure: 176/78 mmHg. Eyes Nonicteric. Reactive to light. Ears, Nose, Mouth, and Throat Lips, teeth, and gums WNL.Marland Kitchen Moist mucosa without lesions. Neck Mccarron, Avamae S. ([id number]) supple and  nontender. No palpable supraclavicular or cervical adenopathy. Normal sized without goiter. Respiratory WNL. No retractions.. Cardiovascular Pedal Pulses WNL. ABI on the right is 1.03. No clubbing, cyanosis or edema. Gastrointestinal (GI) Abdomen without masses or tenderness.. No liver or spleen enlargement or tenderness.. Lymphatic No adneopathy. No adenopathy. No adenopathy. Musculoskeletal Adexa without tenderness or enlargement.. Digits and nails w/o clubbing, cyanosis, infection, petechiae, ischemia, or inflammatory conditions.Marland Kitchen Psychiatric Judgement and insight Intact.. No evidence of depression, anxiety, or agitation.. General Notes: the patient has a ulcerated area on the right ankle surrounded by a mild lichenification of the skin. Subcutaneous slough which was sharply debrided with a forceps and scissors and there was no bleeding. Integumentary (Hair, Skin) No suspicious lesions. No crepitus or fluctuance. No peri-wound warmth or erythema. No masses.. Wound #10 status is Open. Original cause of wound was Trauma. The wound is located on the Right,Lateral Malleolus. The wound measures 0.6cm length x 0.6cm width x 0.2cm depth; 0.283cm^2 area and 0.057cm^3 volume. The wound is limited to skin breakdown. There is no tunneling or undermining noted. There is a large amount of serous drainage noted. The wound margin is flat and intact. There is no granulation within the wound bed. There is a large (67-100%) amount of necrotic tissue within the wound bed including Eschar and Adherent Slough. The periwound skin appearance exhibited: Erythema. The periwound skin appearance did not exhibit: Callus, Crepitus, Excoriation, Induration, Rash, Scarring, Dry/Scaly, Maceration, Atrophie Blanche, Cyanosis, Ecchymosis, Hemosiderin Staining, Mottled, Pallor, Rubor. The surrounding wound skin color is noted with erythema which is circumferential. Periwound temperature was noted as No Abnormality. The  periwound has tenderness on palpation. Assessment Active Problems ICD-10 E11.621 - Type 2 diabetes mellitus with foot ulcer N18.6 - End stage renal disease Mikrut, Chiquita S. ([id number]) Z99.2 - Dependence on renal dialysis L97.312 - Non-pressure chronic ulcer of right ankle with fat layer exposed This 61 year old patient who is a known diabetic and is on hemodialysis for end-stage renal disease has an ulcerated area on the right lateral malleolus which has been there for over a month. After review have recommended: 1. Santyl ointment locally to be applied daily with a bordered foam. 2. x-ray of the right ankle 3. Offloading has been discussed with her 4. adequate protein, vitamin A, vitamin C and zinc 5. Regular review is at the wound center Procedures Wound #10 Wound #10 is a Trauma, Other located on the Right,Lateral Malleolus . There was a Skin/Subcutaneous Tissue Debridement ([id number]) debridement with total area of 0.36 sq cm performed by [name], MD. with the following instrument(s): Blade and Forceps to remove Viable and Non-Viable tissue/material including Fibrin/Slough and Subcutaneous after achieving pain control using Lidocaine 4% Topical Solution. A time out was conducted at 08:54, prior to the start of the procedure. A Minimum amount of bleeding was controlled with Pressure. The procedure was tolerated well with a pain level of 0 throughout and a pain level of 0 following the procedure. Post Debridement Measurements: 0.6cm length x  0.6cm width x 0.2cm depth; 0.057cm^3 volume. Character of Wound/Ulcer Post Debridement is improved. Severity of Tissue Post Debridement is: Fat layer exposed. Post procedure Diagnosis Wound #10: Same as Pre-Procedure Plan Wound Cleansing: Wound #10 Right,Lateral Malleolus: Clean wound with Normal Saline. May Shower, gently pat wound dry prior to applying new dressing. Anesthetic: Wound #10 Right,Lateral Malleolus: Topical Lidocaine  4% cream applied to wound bed prior to debridement Primary Wound Dressing: Wound #10 Right,Lateral Malleolus: Santyl Ointment Medearis, Jimesha S. ([id number]) Secondary Dressing: Wound #10 Right,Lateral Malleolus: Dry Gauze Boardered Foam Dressing Dressing Change Frequency: Wound #10 Right,Lateral Malleolus: Change dressing every day. Follow-up Appointments: Wound #10 Right,Lateral Malleolus: Return Appointment in 1 week. Edema Control: Wound #10 Right,Lateral Malleolus: Patient to wear own compression stockings Medications-please add to medication list.: Wound #10 Right,Lateral Malleolus: Santyl Enzymatic Ointment Radiology ordered were: X-ray, ankle - [place] to obtain mobile xray of right ankle and fax results to [facility] at [phone] This 61 year old patient who is a known diabetic and is on hemodialysis for end-stage renal disease has an ulcerated area on the right lateral malleolus which has been there for over a month. After review have recommended: 1. Santyl ointment locally to be applied daily with a bordered foam. 2. x-ray of the right ankle 3. Offloading has been discussed with her 4. adequate protein, vitamin A, vitamin C and zinc 5. Regular review is at the wound center Electronic Signature(s) Signed: 10/10/2016 9:05:30 AM By: [name], [name] Entered By: [name] on 10/10/2016 09:05:30 Koeller, [name] ([id number]) -------------------------------------------------------------------------------- ROS/PFSH Details [name]. Date of Service: 10/10/2016 8:00 AM Medical Record Number: [id number] Patient Account Number: [id number] Date of Birth/Sex: September 09, 1955 (61 y.o. Female) Treating RN: Montey Hora Primary Care Provider: [name] Other Clinician: Referring Provider: [name] Treating Provider/Extender: Frann Rider in Treatment: 0 Information Obtained From Patient Wound History Do you  currently have one or more open woundso Yes How many open wounds do you currently haveo 1 Approximately how long have you had your woundso 1 month How have you been treating your wound(s) until nowo bandage with TAO Has your wound(s) ever healed and then re-openedo No Have you had any lab work done in the past montho No Have you tested positive for an antibiotic resistant organism (MRSA, VRE)o No Have you tested positive for osteomyelitis (bone infection)o No Have you had any tests for circulation on your legso No Eyes Medical History: Positive for: Glaucoma Past Medical History Notes: legally blind Ear/Nose/Mouth/Throat Medical History: Negative for: Chronic sinus problems/congestion; Middle ear problems Hematologic/Lymphatic Medical History: Positive for: Anemia Respiratory Medical History: Negative for: Aspiration; Asthma; Chronic Obstructive Pulmonary Disease (COPD); Pneumothorax; Sleep Apnea; Tuberculosis Cardiovascular Medical History: Positive for: Coronary Artery Disease; Hypertension Past Medical History Notes: 2 stents placed by Dr. Humphrey Rolls, but one is already blocked per patient KAYLIA, WINBORNE ([id number]) Gastrointestinal Medical History: Negative for: Cirrhosis ; Colitis; Crohnos; Hepatitis A; Hepatitis B; Hepatitis C Past Medical History Notes: hyperthyroidsm Endocrine Medical History: Positive for: Type II Diabetes Time with diabetes: 30year Treated with: Insulin, Oral agents Blood sugar tested every day: Yes Tested : 3-4x/day Blood sugar testing results: Breakfast: 50 Genitourinary Medical History: Positive for: End Stage Renal Disease - on HD MWF Immunological Medical History: Negative for: Lupus Erythematosus; Raynaudos; Scleroderma Integumentary (Skin) Medical History: Negative for: History of Burn; History of pressure wounds Musculoskeletal Medical History: Positive for: Osteoarthritis Neurologic Medical History: Positive for: Neuropathy -  left foot  Oncologic Medical History: Past Medical History Notes: renal cancer, froze it, heat it and froze again. but it has come back per patient. Plan: to take kidney out still waiting on decision. HBO Extended History Items LUCINDA, SPELLS ([id number]) Eyes: Glaucoma Family and Social History Cancer: Yes - Father, Mother; Diabetes: Yes - Father, Mother; Heart Disease: Yes - Mother; Hereditary Spherocytosis: No; Hypertension: Yes - Mother, Father; Kidney Disease: No; Lung Disease: Yes - Mother; Seizures: No; Stroke: No; Thyroid Problems: No; Tuberculosis: No; Never smoker; Marital Status - Married; Alcohol Use: Never; Drug Use: No History; Caffeine Use: Daily - tea, cofee; Financial Concerns: No; Food, Clothing or Shelter Needs: No; Support System Lacking: No; Transportation Concerns: No; Advanced Directives: Yes (Not Provided); Patient does not want information on Advanced Directives; Living Will: Yes (Not Provided) Physician Affirmation I have reviewed and agree with the above information. Electronic Signature(s) Signed: 10/10/2016 4:09:48 PM By: [name], [name] Signed: 10/10/2016 4:57:21 PM By: Montey Hora Entered By: [name] on 10/10/2016 08:34:35 Student, [name] ([id number]) -------------------------------------------------------------------------------- SuperBill Details Patient Name: HOLLIE, WOJAHN. Date of Service: 10/10/2016 Medical Record Number: [id number] Patient Account Number: [id number] Date of Birth/Sex: Sep 25, 1955 (61 y.o. Female) Treating RN: Montey Hora Primary Care Provider: [name] Other Clinician: Referring Provider: [name] Treating Provider/Extender: [name] Service Line: Outpatient [name]: 0 Diagnosis Coding ICD-10 Codes Code Description E11.621 Type 2 diabetes mellitus with foot ulcer N18.6 End stage renal disease Z99.2 Dependence on renal dialysis L97.312 Non-pressure chronic ulcer of right ankle with  fat layer exposed Facility Procedures CPT4 Code Description: 35573220 99213 - WOUND CARE VISIT-LEV 3 EST PT Modifier: Quantity: 1 CPT4 Code Description: 25427062 11042 - DEB SUBQ TISSUE 20 SQ CM/< ICD-10 Description Diagnosis E11.621 Type 2 diabetes mellitus with foot ulcer N18.6 End stage renal disease Z99.2 Dependence on renal dialysis L97.312 Non-pressure chronic ulcer of right  ankle with fat Modifier: layer expose Quantity: 1 d Physician Procedures CPT4 Code Description: 3762831 51761 - WC PHYS LEVEL 4 - EST PT ICD-10 Description Diagnosis E11.621 Type 2 diabetes mellitus with foot ulcer N18.6 End stage renal disease Z99.2 Dependence on renal dialysis L97.312 Non-pressure chronic ulcer of right  ankle with fat Modifier: 25 layer expose Quantity: 1 d CPT4 Code Description: 6073710 62694 - WC PHYS SUBQ TISS 20 SQ CM ICD-10 Description Diagnosis E11.621 Type 2 diabetes mellitus with foot ulcer Orf, Ernest S. ([id number]) Modifier: Quantity: 1 Electronic Signature(s) Signed: 10/10/2016 9:59:32 AM By: Montey Hora Signed: 10/10/2016 4:09:48 PM By: [name], [name] Previous Signature: 10/10/2016 9:05:59 AM Version By: [name], [name] Entered By: Montey Hora on 10/10/2016 09:59:31

## 2016-10-11 NOTE — Progress Notes (Signed)
[name] ([id number]) Visit Report for 10/10/2016 Abuse/Suicide Risk Screen Details [name], [name]. Date of Service: 10/10/2016 8:00 AM Medical Record Number: [id number] Patient Account Number: [id number] Date of Birth/Sex: 1956/02/23 (61 y.o. Female) Treating RN: Montey Hora Primary Care Jobina Maita: [name] Other Clinician: Referring Azriel Dancy: [name] Treating Jorene Kaylor/Extender: Frann Rider in Treatment: 0 Abuse/Suicide Risk Screen Items Answer ABUSE/SUICIDE RISK SCREEN: Has anyone close to you tried to hurt or harm you recentlyo No Do you feel uncomfortable with anyone in your familyo No Has anyone forced you do things that you didnot want to doo No Do you have any thoughts of harming yourselfo No Patient displays signs or symptoms of abuse and/or neglect. No Electronic Signature(s) Signed: 10/10/2016 4:57:21 PM By: Montey Hora Entered By: Montey Hora on 10/10/2016 08:20:26 [name] ([id number]) -------------------------------------------------------------------------------- Activities of Daily Living Details [name]. Date of Service: 10/10/2016 8:00 AM Medical Record Number: [id number] Patient Account Number: [id number] Date of Birth/Sex: 08/29/1955 (61 y.o. Female) Treating RN: Montey Hora Primary Care Keijuan Schellhase: [name] Other Clinician: Referring Elsi Stelzer: [name] Treating Sandeep Delagarza/Extender: Frann Rider in Treatment: 0 Activities of Daily Living Items Answer Activities of Daily Living (Please select one for each item) Drive Automobile Not Able Take Medications Need Assistance Use Telephone Completely Able Care for Appearance Completely Able Use Toilet Completely Able Bath / Shower Need Assistance Dress Self Need Assistance Feed Self Completely Able Walk Need Assistance Get In / Out Bed Completely [place] for Self Need Assistance Electronic Signature(s) Signed: 10/10/2016 4:57:21 PM By: Montey Hora Entered By: Montey Hora on 10/10/2016 08:20:54 [name] ([id number]) -------------------------------------------------------------------------------- Education Assessment Details [name]. Date of Service: 10/10/2016 8:00 AM Medical Record Number: [id number] Patient Account Number: [id number] Date of Birth/Sex: [birth year] (61 y.o. Female) Treating RN: Montey Hora Primary Care Elin Seats: [name] Other Clinician: Referring Wendle Kina: [name] Treating Alexa Blish/Extender: Frann Rider in Treatment: 0 Primary Learner Assessed: Caregiver Reason Patient is not Primary Learner: nurses at facility Learning Preferences/Education Level/Primary Language Learning Preference: Explanation, Printed Material Highest Education Level: College or Above Preferred Language: English Cognitive Barrier Assessment/Beliefs Language Barrier: No Translator Needed: No Memory Deficit: No Emotional Barrier: No Cultural/Religious Beliefs Affecting Medical No Care: Physical Barrier Assessment Impaired Vision: No Impaired Hearing: No Decreased Hand dexterity: No Knowledge/Comprehension Assessment Knowledge Level: Medium Comprehension Level: Medium Ability to understand written Medium instructions: Ability to understand verbal Medium instructions: Motivation Assessment Anxiety Level: Calm Cooperation: Cooperative Education Importance: Acknowledges Need Interest in Health Problems: Asks Questions Perception: Coherent Willingness to Engage in Self- Medium Management Activities: Readiness to Engage in Self- Medium Management Activities: [name] ([id number]) Electronic Signature(s) Signed: 10/10/2016 4:57:21 PM By: Montey Hora Entered By: Montey Hora on 10/10/2016 08:26:21 [name]  ([id number]) -------------------------------------------------------------------------------- Fall Risk Assessment Details [name], [name] Date of Service: 10/10/2016 8:00 AM Medical Record Number: [id number] Patient Account Number: [id number] Date of Birth/Sex: 03/16/1956 (62 y.o. Female) Treating RN: Montey Hora Primary Care Ellory Khurana: [name] Other Clinician: Referring Kenyen Candy: [name] Treating Mayline Dragon/Extender: Frann Rider in Treatment: 0 Fall Risk Assessment Items Have you had 2 or more falls in the last 12 monthso 0 No Have you had any fall that resulted in injury in the last 12 monthso 0 No FALL RISK ASSESSMENT: History of falling - immediate or within 3 months 0 No Secondary diagnosis 0 No  Ambulatory aid None/bed rest/wheelchair/nurse 0 No Crutches/cane/walker 15 Yes Furniture 0 No IV Access/Saline Lock 0 No Gait/Training Normal/bed rest/immobile 0 No Weak 10 Yes Impaired 0 No Mental Status Oriented to own ability 0 Yes Electronic Signature(s) Signed: 10/10/2016 4:57:21 PM By: Montey Hora Entered By: Montey Hora on 10/10/2016 08:26:34 [name] ([id number]) -------------------------------------------------------------------------------- Foot Assessment Details [name], [name] Date of Service: 10/10/2016 8:00 AM Medical Record Number: [id number] Patient Account Number: [id number] Date of Birth/Sex: 04/30/1956 (61 y.o. Female) Treating RN: Montey Hora Primary Care Arlen Dupuis: [name] Other Clinician: Referring [place] Flett: [name] Treating Kraven Calk/Extender: Frann Rider in Treatment: 0 Foot Assessment Items Site Locations + = Sensation present, - = Sensation absent, C = Callus, U = Ulcer R = Redness, W = Warmth, M = Maceration, PU = Pre-ulcerative lesion F = Fissure, S = Swelling, D = Dryness Assessment Right: Left: Other Deformity: No No Prior Foot Ulcer: No No Prior Amputation: No  No Charcot Joint: No No Ambulatory Status: Ambulatory With Help Assistance Device: Walker Gait: Steady Electronic Signature(s) Signed: 10/10/2016 4:57:21 PM By: Montey Hora Entered By: Montey Hora on 10/10/2016 08:27:11 [name] ([id number]) -------------------------------------------------------------------------------- Nutrition Risk Assessment Details [name], [name] Date of Service: 10/10/2016 8:00 AM Medical Record Number: [id number] Patient Account Number: [id number] Date of Birth/Sex: [birth year] (61 y.o. Female) Treating RN: Montey Hora Primary Care Jourden Gilson: [name] Other Clinician: Referring Aerik Polan: [name] Treating Fatma Rutten/Extender: Frann Rider in Treatment: 0 Height (in): 61 Weight (lbs): 160 Body Mass Index (BMI): 30.2 Nutrition Risk Assessment Items NUTRITION RISK SCREEN: I have an illness or condition that made me change the kind and/or 2 Yes amount of food I eat I eat fewer than two meals per day 0 No I eat few fruits and vegetables, or milk products 0 No I have three or more drinks of beer, liquor or wine almost every day 0 No I have tooth or mouth problems that make it hard for me to eat 0 No I don't always have enough money to buy the food I need 0 No I eat alone most of the time 0 No I take three or more different prescribed or over-the-counter drugs a 1 Yes day Without wanting to, I have lost or gained 10 pounds in the last six 0 No months I am not always physically able to shop, cook and/or feed myself 0 No Nutrition Protocols Good Risk Protocol Provide education on Moderate Risk Protocol 0 nutrition Electronic Signature(s) Signed: 10/10/2016 4:57:21 PM By: Montey Hora Entered By: Montey Hora on 10/10/2016 08:26:50

## 2016-10-17 ENCOUNTER — Encounter: Payer: Medicare Other | Attending: Surgery | Admitting: Surgery

## 2016-10-17 DIAGNOSIS — Z992 Dependence on renal dialysis: Secondary | ICD-10-CM | POA: Diagnosis not present

## 2016-10-17 DIAGNOSIS — I132 Hypertensive heart and chronic kidney disease with heart failure and with stage 5 chronic kidney disease, or end stage renal disease: Secondary | ICD-10-CM | POA: Insufficient documentation

## 2016-10-17 DIAGNOSIS — E785 Hyperlipidemia, unspecified: Secondary | ICD-10-CM | POA: Insufficient documentation

## 2016-10-17 DIAGNOSIS — E1122 Type 2 diabetes mellitus with diabetic chronic kidney disease: Secondary | ICD-10-CM | POA: Insufficient documentation

## 2016-10-17 DIAGNOSIS — I4891 Unspecified atrial fibrillation: Secondary | ICD-10-CM | POA: Diagnosis not present

## 2016-10-17 DIAGNOSIS — L97312 Non-pressure chronic ulcer of right ankle with fat layer exposed: Secondary | ICD-10-CM | POA: Diagnosis not present

## 2016-10-17 DIAGNOSIS — E11621 Type 2 diabetes mellitus with foot ulcer: Secondary | ICD-10-CM | POA: Insufficient documentation

## 2016-10-17 DIAGNOSIS — N186 End stage renal disease: Secondary | ICD-10-CM | POA: Diagnosis not present

## 2016-10-17 DIAGNOSIS — E1142 Type 2 diabetes mellitus with diabetic polyneuropathy: Secondary | ICD-10-CM | POA: Insufficient documentation

## 2016-10-17 DIAGNOSIS — I509 Heart failure, unspecified: Secondary | ICD-10-CM | POA: Diagnosis not present

## 2016-10-17 DIAGNOSIS — D649 Anemia, unspecified: Secondary | ICD-10-CM | POA: Insufficient documentation

## 2016-10-17 DIAGNOSIS — I2510 Atherosclerotic heart disease of native coronary artery without angina pectoris: Secondary | ICD-10-CM | POA: Insufficient documentation

## 2016-10-18 NOTE — Progress Notes (Signed)
LORANA, MAFFEO ([id number]) Visit Report for 10/17/2016 Chief Complaint Document Details [name], [name]. Date of Service: 10/17/2016 9:15 AM Medical Record Number: [id number] Patient Account Number: [id number] Date of Birth/Sex: 02/10/1956 (61 y.o. Female) Treating RN: Montey Hora Primary Care Provider: [name] Other Clinician: Referring Provider: [name] Treating Provider/Extender: Frann Rider in Treatment: 1 Information Obtained from: Patient Chief Complaint Patients presents for treatment of an open diabetic ulcer right lateral ankle which she's had for about a month Electronic Signature(s) Signed: 10/17/2016 10:00:53 AM By: [name], [name] Entered By: [name] on 10/17/2016 10:00:53 [place], [name] ([id number]) -------------------------------------------------------------------------------- Debridement Details [name]. Date of Service: 10/17/2016 9:15 AM Medical Record Number: [id number] Patient Account Number: [id number] Date of Birth/Sex: Nov 24, 1955 (61 y.o. Female) Treating RN: Montey Hora Primary Care Provider: [name] Other Clinician: Referring Provider: [name] Treating Provider/Extender: Frann Rider in Treatment: 1 Debridement Performed for Wound #10 Right,Lateral Malleolus Assessment: Performed By: Physician [name], MD Debridement: Debridement Pre-procedure Yes - 09:44 Verification/Time Out Taken: Start Time: 09:44 Pain Control: Lidocaine 4% Topical Solution Level: Skin/Subcutaneous Tissue Total Area Debrided (L x 0.5 (cm) x 0.4 (cm) = 0.2 (cm) W): Tissue and other Viable, Non-Viable, Fibrin/Slough, Subcutaneous material debrided: Instrument: Curette Bleeding: Minimum Hemostasis Achieved: Pressure End Time: 09:46 Procedural Pain: 0 Post Procedural Pain: 0 Response to Treatment: Procedure was tolerated well Post Debridement Measurements of Total Wound Length: (cm)  0.5 Width: (cm) 0.4 Depth: (cm) 0.3 Volume: (cm) 0.047 Character of Wound/Ulcer Post Improved Debridement: Severity of Tissue Post Debridement: Fat layer exposed Post Procedure Diagnosis Same as Pre-procedure Electronic Signature(s) Signed: 10/17/2016 10:00:44 AM By: [name], [name] Signed: 10/17/2016 4:56:18 PM By: Montey Hora Entered By: [name] on 10/17/2016 10:00:44 [place], [name] ([id number]) [name], [name] ([id number]) -------------------------------------------------------------------------------- HPI Details [name], [name]. Date of Service: 10/17/2016 9:15 AM Medical Record Number: [id number] Patient Account Number: [id number] Date of Birth/Sex: 12/29/1955 (61 y.o. Female) Treating RN: Montey Hora Primary Care Provider: [name] Other Clinician: Referring Provider: [name] Treating Provider/Extender: Frann Rider in Treatment: 1 History of Present Illness Location: right lateral ankle Quality: Patient reports No Pain. Severity: Patient states wound (s) are getting better. Duration: Patient has had the wound for > 1 months prior to seeking treatment at the wound center Context: The wound appeared gradually over time, and she thinks she might have hit it with a walker Modifying Factors: Patient is currently on renal dialysis and receives treatments 3 times weekly HPI Description: 61 year old patient known to our practice from several previous visits for various problems now comes back with an ulcerated area on her right lateral malleolus which she's had for about a month and she thinks she may have hit it with her walker. No other problems and no x-ray of this has been taken. She has a past medical history of atrial fibrillation, carotid artery disease, CHF, chronic kidney disease on hemodialysis with a functioning AV fistula seen by Dr. Corene Cornea dew, diabetes mellitus with peripheral neuropathy, pancreatitis and is status post  abdominal hysterectomy, AV fistula placement in June 2016, back surgery, cholecystectomy, coronary angioplasty,several surgeries for AV fistula, and dialysis catheters. She has never been a smoker. 10/17/2016 -- had a right ankle x-ray which showed no acute pathology of the right ankle. Arterial vascular calcifications are noted and there is generalized osteopenia and demineralization ===== Old notes: 61 year old patient who is known to be a diabetic and has  end-stage renal disease has had several comorbidities including coronary artery disease, hypertension, hyperlipidemia, pancreatitis, anemia, previous history of hysterectomy, cholecystectomy, left-sided salivary gland excision, bilateral cataract surgery,Peritoneal dialysis catheter, hemodialysis catheter. the area on the back has also been caused by instant pressure she used to sleep on a recliner all day and has significant kyphoscoliosis. As far as the wound on her right lower extremity she's not sure how this blister occurred but she thought it has been there for about 2 weeks. No recent blood investigations available and no recent hemoglobin A1c. 12/30/2014 -- she is an assisted living facility but I believe the nurses that have not followed instructions as she had some cream applied on her back and there was a different dressing. Last week she's had a AV fistula placed on her left forearm. 01/13/2015 -- she has had some localized infection at the port site and she's been on doxycycline for this. 02/05/2015 - he has developed a small blister on her right lower extremity. 02/10/2015 -- she has developed another small blister on her right anterior chest wall in the area where she's had tape for her dialysis access. this may just be injury caused by a tape burn. [name] ([id number]) She had a dermatology opinion and they have taken a biopsy of her skin. She also had a left brachial AV fistula placed this  week. ========= Electronic Signature(s) Signed: 10/17/2016 10:01:18 AM By: [name], [name] Entered By: [name] on 10/17/2016 10:01:17 [place], [name] ([id number]) -------------------------------------------------------------------------------- Physical Exam Details [name], 63. Date of Service: 10/17/2016 9:15 AM Medical Record Number: [id number] Patient Account Number: [id number] Date of Birth/Sex: November 13, 1955 (61 y.o. Female) Treating RN: Montey Hora Primary Care Provider: [name] Other Clinician: Referring Provider: [name] Treating Provider/Extender: Frann Rider in Treatment: 1 Constitutional . Pulse regular. Respirations normal and unlabored. Afebrile. . Eyes Nonicteric. Reactive to light. Ears, Nose, Mouth, and Throat Lips, teeth, and gums WNL.Marland Kitchen Moist mucosa without lesions. Neck supple and nontender. No palpable supraclavicular or cervical adenopathy. Normal sized without goiter. Respiratory WNL. No retractions.. Cardiovascular Pedal Pulses WNL. No clubbing, cyanosis or edema. Lymphatic No adneopathy. No adenopathy. No adenopathy. Musculoskeletal Adexa without tenderness or enlargement.. Digits and nails w/o clubbing, cyanosis, infection, petechiae, ischemia, or inflammatory conditions.. Integumentary (Hair, Skin) No suspicious lesions. No crepitus or fluctuance. No peri-wound warmth or erythema. No masses.Marland Kitchen Psychiatric Judgement and insight Intact.. No evidence of depression, anxiety, or agitation.. Notes the ulcerated area on the right lateral ankle continues to have subcutaneous debris which have sharply removed with a #3 curet and minimal bleeding controlled with pressure. It does not probe down to bone. Electronic Signature(s) Signed: 10/17/2016 10:01:53 AM By: [name], [name] Entered By: [name] on 10/17/2016 10:01:52 [name], [name]  ([id number]) -------------------------------------------------------------------------------- Physician Orders Details [name]. Date of Service: 10/17/2016 9:15 AM Medical Record Number: [id number] Patient Account Number: [id number] Date of Birth/Sex: April 20, 1956 (61 y.o. Female) Treating RN: Montey Hora Primary Care Provider: [name] Other Clinician: Referring Provider: [name] Treating Provider/Extender: Frann Rider in Treatment: 1 Verbal / Phone Orders: No Diagnosis Coding Wound Cleansing Wound #10 Right,Lateral Malleolus o Clean wound with Normal Saline. o May Shower, gently pat wound dry prior to applying new dressing. Anesthetic Wound #10 Right,Lateral Malleolus o Topical Lidocaine 4% cream applied to wound bed prior to debridement Primary Wound Dressing Wound #10 Right,Lateral Malleolus o Santyl Ointment Secondary Dressing Wound #10 Right,Lateral Malleolus o Dry Gauze   o Boardered Foam Dressing Dressing Change Frequency Wound #10 Right,Lateral Malleolus o Change dressing every day. Follow-up Appointments Wound #10 Right,Lateral Malleolus o Return Appointment in 1 week. Edema Control Wound #10 Right,Lateral Malleolus o Patient to wear own compression stockings Medications-please add to medication list. Wound #10 Right,Lateral Malleolus o Santyl Enzymatic Ointment ERMAL, BRZOZOWSKI ([id number]) Electronic Signature(s) Signed: 10/17/2016 4:17:13 PM By: [name], [name] Signed: 10/17/2016 4:56:18 PM By: Montey Hora Entered By: Montey Hora on 10/17/2016 09:47:20 [name], [name] ([id number]) -------------------------------------------------------------------------------- Problem List Details [name], [name] Date of Service: 10/17/2016 9:15 AM Medical Record Number: [id number] Patient Account Number: [id number] Date of Birth/Sex: [birth year] (61 y.o. 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 Date of Service: 10/17/2016 9:15 AM Medical Record Number: [id number] Patient Account Number: [id number] Date of Birth/Sex: July 01, 1956 (61 y.o. Female) Treating RN: Montey Hora Primary Care Provider: [name] Other Clinician: Referring Provider: [name] Treating Provider/Extender: Frann Rider in Treatment: 1 Subjective Chief Complaint Information obtained from Patient Patients presents for treatment of an open diabetic ulcer right lateral ankle which she's had for about a month History of Present Illness (HPI) The following HPI elements were documented for the patient's wound: Location: right lateral ankle Quality: Patient reports No Pain. Severity: Patient states wound (s) are getting better. Duration: Patient has had the wound for > 1 months prior to seeking treatment at the wound center Context: The wound appeared gradually over time, and she thinks she might have hit it with a walker Modifying Factors: Patient is currently on renal dialysis and receives treatments 3 times weekly 61 year old patient known to our practice from several previous visits for various problems now comes  back with an ulcerated area on her right lateral malleolus which she's had for about a month and she thinks she may have hit it with her walker. No other problems and no x-ray of this has been taken. She has a past medical history of atrial fibrillation, carotid artery disease, CHF, chronic kidney disease on hemodialysis with a functioning AV fistula seen by Dr. Corene Cornea dew, diabetes mellitus with peripheral neuropathy, pancreatitis and is status post abdominal hysterectomy, AV fistula placement in June 2016, back surgery, cholecystectomy, coronary angioplasty,several surgeries for AV fistula, and dialysis catheters. She has never been a smoker. 10/17/2016 -- had a right ankle x-ray which showed no acute pathology of the right ankle. Arterial vascular calcifications are noted and there is generalized osteopenia and demineralization ===== Old notes: 61 year old patient who is known to be a diabetic and has end-stage renal disease has had several comorbidities including coronary artery disease, hypertension, hyperlipidemia, pancreatitis, anemia, previous history of hysterectomy, cholecystectomy, left-sided salivary gland excision, bilateral cataract surgery,Peritoneal dialysis catheter, hemodialysis catheter. the area on the back has also been caused by instant pressure she used to sleep on a recliner all day and has significant kyphoscoliosis. As far as the wound on her right lower  extremity she's not sure how this blister occurred but she thought it has been there for about 2 weeks. No recent blood investigations available and no recent hemoglobin A1c. [name], [name] ([id number]) 12/30/2014 -- she is an assisted living facility but I believe the nurses that have not followed instructions as she had some cream applied on her back and there was a different dressing. Last week she's had a AV fistula placed on her left forearm. 01/13/2015 -- she has had some localized infection at the port site  and she's been on doxycycline for this. 02/05/2015 - he has developed a small blister on her right lower extremity. 02/10/2015 -- she has developed another small blister on her right anterior chest wall in the area where she's had tape for her dialysis access. this may just be injury caused by a tape burn. She had a dermatology opinion and they have taken a biopsy of her skin. She also had a left brachial AV fistula placed this week. ========= Objective Constitutional Pulse regular. Respirations normal and unlabored. Afebrile. Vitals Time Taken: 9:33 AM, Height: 61 in, Weight: 160 lbs, BMI: 30.2, Temperature: 98.3 F, Pulse: 76 bpm, Respiratory Rate: 18 breaths/min, Blood Pressure: 149/63 mmHg. Eyes Nonicteric. Reactive to light. Ears, Nose, Mouth, and Throat Lips, teeth, and gums WNL.Marland Kitchen Moist mucosa without lesions. Neck supple and nontender. No palpable supraclavicular or cervical adenopathy. Normal sized without goiter. Respiratory WNL. No retractions.. Cardiovascular Pedal Pulses WNL. No clubbing, cyanosis or edema. Lymphatic No adneopathy. No adenopathy. No adenopathy. Musculoskeletal Adexa without tenderness or enlargement.. Digits and nails w/o clubbing, cyanosis, infection, petechiae, ischemia, or inflammatory conditions.Marland Kitchen Psychiatric Judgement and insight Intact.. No evidence of depression, anxiety, or agitation.[name] ([id number]) General Notes: the ulcerated area on the right lateral ankle continues to have subcutaneous debris which have sharply removed with a #3 curet and minimal bleeding controlled with pressure. It does not probe down to bone. Integumentary (Hair, Skin) No suspicious lesions. No crepitus or fluctuance. No peri-wound warmth or erythema. No masses.. Wound #10 status is Open. Original cause of wound was Trauma. The wound is located on the Right,Lateral Malleolus. The wound measures 0.5cm length x 0.4cm width x 0.2cm depth; 0.157cm^2 area and  0.031cm^3 volume. The wound is limited to skin breakdown. There is no tunneling or undermining noted. There is a large amount of serous drainage noted. The wound margin is flat and intact. There is no granulation within the wound bed. There is a large (67-100%) amount of necrotic tissue within the wound bed including Eschar and Adherent Slough. The periwound skin appearance exhibited: Erythema. The periwound skin appearance did not exhibit: Callus, Crepitus, Excoriation, Induration, Rash, Scarring, Dry/Scaly, Maceration, Atrophie Blanche, Cyanosis, Ecchymosis, Hemosiderin Staining, Mottled, Pallor, Rubor. The surrounding wound skin color is noted with erythema which is circumferential. Periwound temperature was noted as No Abnormality. The periwound has tenderness on palpation. Assessment Active Problems ICD-10 E11.621 - Type 2 diabetes mellitus with foot ulcer N18.6 - End stage renal disease Z99.2 - Dependence on renal dialysis L97.312 - Non-pressure chronic ulcer of right ankle with fat layer exposed Procedures Wound #10 Wound #10 is a Trauma, Other located on the Right,Lateral Malleolus . There was a Skin/Subcutaneous Tissue Debridement ([id number]) debridement with total area of 0.2 sq cm performed by [name], MD. with the following instrument(s): Curette to remove Viable and Non-Viable tissue/material including Fibrin/Slough and Subcutaneous after achieving pain control using Lidocaine 4% Topical Solution. A time out was conducted at 09:44, prior to  the start of the procedure. A Minimum amount of bleeding was controlled with Pressure. The procedure was tolerated well with a pain level of 0 throughout and a pain level of 0 following the procedure. Post Debridement Measurements: 0.5cm length x 0.4cm width x 0.3cm depth; 0.047cm^3 volume. Character of Wound/Ulcer Post Debridement is improved. Severity of Tissue Post Debridement is: Fat layer [name], [name]  ([id number]) exposed. Post procedure Diagnosis Wound #10: Same as Pre-Procedure Plan Wound Cleansing: Wound #10 Right,Lateral Malleolus: Clean wound with Normal Saline. May Shower, gently pat wound dry prior to applying new dressing. Anesthetic: Wound #10 Right,Lateral Malleolus: Topical Lidocaine 4% cream applied to wound bed prior to debridement Primary Wound Dressing: Wound #10 Right,Lateral Malleolus: Santyl Ointment Secondary Dressing: Wound #10 Right,Lateral Malleolus: Dry Gauze Boardered Foam Dressing Dressing Change Frequency: Wound #10 Right,Lateral Malleolus: Change dressing every day. Follow-up Appointments: Wound #10 Right,Lateral Malleolus: Return Appointment in 1 week. Edema Control: Wound #10 Right,Lateral Malleolus: Patient to wear own compression stockings Medications-please add to medication list.: Wound #10 Right,Lateral Malleolus: Santyl Enzymatic Ointment After review have recommended: 1. Santyl ointment locally to be applied daily with a bordered foam. 2. x-ray of the right ankle -- results reviewed with her 3. Offloading has been discussed with her 4. adequate protein, vitamin A, vitamin C and zinc 5. Regular review is at the wound center ARNETT, GALINDEZ ([id number]) Electronic Signature(s) Signed: 10/17/2016 10:02:44 AM By: [name], [name] Entered By: [name] on 10/17/2016 10:02:44 Like, [name] ([id number]) -------------------------------------------------------------------------------- SuperBill Details [name]. Date of Service: 10/17/2016 Medical Record Number: [id number] Patient Account Number: [id number] Date of Birth/Sex: [birth year] (61 y.o. Female) Treating RN: Montey Hora Primary Care Provider: [name] Other Clinician: Referring Provider: [name] Treating Provider/Extender: Frann Rider in Treatment: 1 Diagnosis Coding ICD-10 Codes Code Description E11.621 Type 2 diabetes mellitus  with foot ulcer N18.6 End stage renal disease Z99.2 Dependence on renal dialysis L97.312 Non-pressure chronic ulcer of right ankle with fat layer exposed Facility Procedures CPT4 Code Description: [id number] - DEB SUBQ TISSUE 20 SQ CM/< ICD-10 Description Diagnosis E11.621 Type 2 diabetes mellitus with foot ulcer N18.6 End stage renal disease Z99.2 Dependence on renal dialysis L97.312 Non-pressure chronic ulcer of right  ankle with fat Modifier: layer expose Quantity: 1 d Physician Procedures CPT4 Code Description: [id number] 10258 - WC PHYS LEVEL 3 - EST PT ICD-10 Description Diagnosis E11.621 Type 2 diabetes mellitus with foot ulcer N18.6 End stage renal disease Z99.2 Dependence on renal dialysis L97.312 Non-pressure chronic ulcer of right  ankle with fat Modifier: 25 layer expose Quantity: 1 d CPT4 Code Description: [id number] 23536 - WC PHYS SUBQ TISS 20 SQ CM ICD-10 Description Diagnosis E11.621 Type 2 diabetes mellitus with foot ulcer N18.6 End stage renal disease Z99.2 Dependence on renal dialysis L97.312 Non-pressure chronic ulcer of right  ankle with fat Adeyemi, Izola S. ([id number]) Modifier: layer expose Quantity: 1 d Electronic Signature(s) Signed: 10/17/2016 10:03:06 AM By: [name], [name] Entered By: [name] on 10/17/2016 10:03:05

## 2016-10-18 NOTE — Progress Notes (Signed)
DIANNE, WHELCHEL ([id number]) Visit Report for 10/17/2016 Arrival Information Details [name], [name]. Date of Service: 10/17/2016 9:15 AM Medical Record Number: [id number] Patient Account Number: [id number] Date of Birth/Sex: [birth year] (61 y.o. Female) Treating RN: [name] Primary Care [name]: [name] Other Clinician: Referring [name]: [name] Treating [name]/Extender: [name] in Treatment: 1 Visit Information History Since Last Visit Added or deleted any medications: No Patient Arrived: [place] Any new allergies or adverse reactions: No Arrival Time: 09:32 Had a fall or experienced change in No Accompanied By: self activities of daily living that may affect Transfer Assistance: None risk of falls: Patient Identification Verified: Yes Signs or symptoms of abuse/neglect since last No Secondary Verification Process Yes visito Completed: Hospitalized since last visit: No Patient Has Alerts: Yes Has Dressing in Place as Prescribed: Yes Patient Alerts: Patient on Blood Pain Present Now: No Thinner Electronic Signature(s) Signed: 10/17/2016 4:56:18 PM By: [name] Entered By: [name] on 10/17/2016 09:33:48 [name], [name] ([id number]) -------------------------------------------------------------------------------- Encounter Discharge Information Details [name]. Date of Service: 10/17/2016 9:15 AM Medical Record Number: [id number] Patient Account Number: [id number] Date of Birth/Sex: [name] (61 y.o. Female) Treating RN: [name] Primary Care Desarea Ohagan: [name] Other Clinician: Referring Rayanna Matusik: [name] Treating Zerina Hallinan/Extender: [name] in Treatment: 1 Encounter Discharge Information Items Discharge Pain Level: 0 Discharge Condition: Stable Ambulatory Status: [place] Discharge Destination: Home Transportation: Private Auto Accompanied By: self Schedule  Follow-up Appointment: Yes Medication Reconciliation completed No and provided to Patient/Care Allesandra Huebsch: Provided on Clinical Summary of Care: 10/17/2016 Form Type Recipient Paper Patient LD Electronic Signature(s) Signed: 10/17/2016 10:04:46 AM By: [name] Previous Signature: 10/17/2016 10:04:03 AM Version By: Ruthine Dose Entered By: [name] on 10/17/2016 10:04:46 [name], [name] ([id number]) -------------------------------------------------------------------------------- Lower Extremity Assessment Details Patient Name: [name], [name] Date of Service: 10/17/2016 9:15 AM Medical Record Number: [id number] Patient Account Number: [id number] Date of Birth/Sex: [name] (61 y.o. Female) Treating RN: [name] Primary Care Kaladin Noseworthy: [name] Other Clinician: Referring Sierra Bissonette: [name] Treating Amir Glaus/Extender: [name] in Treatment: 1 Vascular Assessment Pulses: Dorsalis Pedis Palpable: [Right:Yes] Posterior Tibial Extremity colors, hair growth, and conditions: Extremity Color: [Right:Normal] Hair Growth on Extremity: [Right:No] Temperature of Extremity: [Right:Warm] Capillary Refill: [Right:< 3 seconds] Electronic Signature(s) Signed: 10/17/2016 4:56:18 PM By: [name] Entered By: [name] on 10/17/2016 09:42:02 [name], [name] ([id number]) -------------------------------------------------------------------------------- Multi Wound Chart Details [name], [name] Date of Service: 10/17/2016 9:15 AM Medical Record Number: [id number] Patient Account Number: [id number] Date of Birth/Sex: [birth year] [name] (61 y.o. Female) Treating RN: [name] Primary Care Naome Brigandi: [name] Other Clinician: Referring Particia Strahm: [name] Treating Ayane Delancey/Extender: [name] in Treatment: 1 Vital Signs Height(in): 61 Pulse(bpm): 76 Weight(lbs): 160 Blood Pressure 149/63 (mmHg): Body Mass Index(BMI):  30 Temperature(F): 98.3 Respiratory Rate 18 (breaths/min): Photos: [10:No Photos] [N/A:N/A] Wound Location: [10:Right Malleolus - Lateral N/A] Wounding Event: [10:Trauma] [N/A:N/A] Primary Etiology: [10:Trauma, Other] [N/A:N/A] Comorbid History: [10:Glaucoma, Anemia, Coronary Artery Disease, Hypertension, Type II Diabetes, End Stage Renal Disease, Osteoarthritis, Neuropathy] [N/A:N/A] Date Acquired: [10:09/12/2016] [N/A:N/A] Weeks of Treatment: [10:1] [N/A:N/A] Wound Status: [10:Open] [N/A:N/A] Measurements L x W x D 0.5x0.4x0.2 [N/A:N/A] (cm) Area (cm) : [10:0.157] [N/A:N/A] Volume (cm) : [10:0.031] [N/A:N/A] % Reduction in Area: [10:44.50%] [N/A:N/A] % Reduction in Volume: 45.60% [N/A:N/A] Classification: [10:Full Thickness Without Exposed Support Structures] [N/A:N/A] HBO Classification: [10:Grade 1] [N/A:N/A] Exudate Amount: [10:Large] [N/A:N/A] Exudate Type: [10:Serous] [N/A:N/A] Exudate Color: [10:amber] [N/A:N/A] Wound Margin: [10:Flat  and Intact] [N/A:N/A] Granulation Amount: [10:None Present (0%)] [N/A:N/A] Necrotic Amount: [10:Large (67-100%)] [N/A:N/A] Necrotic Tissue: [10:Eschar, Adherent Slough N/A] Exposed Structures: Fascia: No N/A N/A Fat Layer (Subcutaneous Tissue) Exposed: No Tendon: No Muscle: No Joint: No Bone: No Limited to Skin Breakdown Epithelialization: None N/A N/A Debridement: Debridement ([id number]- N/A N/A [id number]) Pre-procedure 09:44 N/A N/A Verification/Time Out Taken: Pain Control: Lidocaine 4% Topical N/A N/A Solution Tissue Debrided: Fibrin/Slough, N/A N/A Subcutaneous Level: Skin/Subcutaneous N/A N/A Tissue Debridement Area (sq 0.2 N/A N/A cm): Instrument: Curette N/A N/A Bleeding: Minimum N/A N/A Hemostasis Achieved: Pressure N/A N/A Procedural Pain: 0 N/A N/A Post Procedural Pain: 0 N/A N/A Debridement Treatment Procedure was tolerated N/A N/A Response: well Post Debridement 0.5x0.4x0.3 N/A N/A Measurements L x W x  D (cm) Post Debridement 0.047 N/A N/A Volume: (cm) Periwound Skin Texture: Excoriation: No N/A N/A Induration: No Callus: No Crepitus: No Rash: No Scarring: No Periwound Skin Maceration: No N/A N/A Moisture: Dry/Scaly: No Periwound Skin Color: Erythema: Yes N/A N/A Atrophie Blanche: No Cyanosis: No Ecchymosis: No Hemosiderin Staining: No Mottled: No Vanessen, Lajeana S. ([id number]) Pallor: No Rubor: No Erythema Location: Circumferential N/A N/A Temperature: No Abnormality N/A N/A Tenderness on Yes N/A N/A Palpation: Wound Preparation: Ulcer Cleansing: N/A N/A Rinsed/Irrigated with Saline Topical Anesthetic Applied: Other: lidocaine 4% Procedures Performed: Debridement N/A N/A Treatment Notes Electronic Signature(s) Signed: 10/17/2016 10:00:33 AM By: [name], [name] Entered By: [name] on 10/17/2016 10:00:32 [name], [name] ([id number]) -------------------------------------------------------------------------------- [place] Details [name], NAM. Date of Service: 10/17/2016 9:15 AM Medical Record Number: [id number] Patient Account Number: [id number] Date of Birth/Sex: [birth year] (61 y.o. Female) Treating RN: [name] Primary Care Peyten Punches: [name] Other Clinician: Referring Lecia Esperanza: [name] Treating Trudee Chirino/Extender: [name] in Treatment: 1 Active Inactive ` Abuse / Safety / Falls / Self Care Management Nursing Diagnoses: Potential for falls Goals: Patient will remain injury free Date Initiated: 10/10/2016 Target Resolution Date: 12/23/2016 Goal Status: Active Interventions: Assess fall risk on admission and as needed Notes: ` Nutrition Nursing Diagnoses: Potential for alteratiion in Nutrition/Potential for imbalanced nutrition Goals: Patient/caregiver agrees to and verbalizes understanding of need to use nutritional supplements and/or vitamins as prescribed Date Initiated:  10/10/2016 Target Resolution Date: 10/21/2016 Goal Status: Active Interventions: Assess patient nutrition upon admission and as needed per policy Notes: ` Orientation to the Wound Care Program Nursing Diagnoses: Knowledge deficit related to the wound healing center program [name], [name] ([id number]) Goals: Patient/caregiver will verbalize understanding of the [place] Date Initiated: 10/10/2016 Target Resolution Date: 12/23/2016 Goal Status: Active Interventions: Provide education on orientation to the wound center Notes: ` Wound/Skin Impairment Nursing Diagnoses: Impaired tissue integrity Goals: Patient/caregiver will verbalize understanding of skin care regimen Date Initiated: 10/10/2016 Target Resolution Date: 12/23/2016 Goal Status: Active Ulcer/skin breakdown will have a volume reduction of 30% by week 4 Date Initiated: 10/10/2016 Target Resolution Date: 12/23/2016 Goal Status: Active Ulcer/skin breakdown will have a volume reduction of 50% by week 8 Date Initiated: 10/10/2016 Target Resolution Date: 12/23/2016 Goal Status: Active Ulcer/skin breakdown will have a volume reduction of 80% by week 12 Date Initiated: 10/10/2016 Target Resolution Date: 12/23/2016 Goal Status: Active Ulcer/skin breakdown will heal within 14 weeks Date Initiated: 10/10/2016 Target Resolution Date: 12/23/2016 Goal Status: Active Interventions: Assess patient/caregiver ability to obtain necessary supplies Assess patient/caregiver ability to perform ulcer/skin care regimen upon admission and as needed Assess ulceration(s) every visit Notes: Electronic Signature(s) Signed: 10/17/2016 4:56:18 PM By: [name]  Entered By: [name] on 10/17/2016 09:43:11 [name], [name] ([id number]) -------------------------------------------------------------------------------- Pain Assessment Details [name], EBLEN. Date of Service: 10/17/2016 9:15 AM Medical Record Number:  [id number] Patient Account Number: [id number] Date of Birth/Sex: [name], [name] (61 y.o. Female) Treating RN: [name] Primary Care Amey Hossain: [name] Other Clinician: Referring Jacolby Risby: [name] Treating Ceairra Mccarver/Extender: [name] in Treatment: 1 Active Problems Location of Pain Severity and Description of Pain Patient Has Paino No Site Locations Pain Management and Medication Current Pain Management: Notes Topical or injectable lidocaine is offered to patient for acute pain when surgical debridement is performed. If needed, Patient is instructed to use over the counter pain medication for the following 24-48 hours after debridement. Wound care MDs do not prescribed pain medications. Patient has chronic pain or uncontrolled pain. Patient has been instructed to make an appointment with their Primary Care Physician for pain management. Electronic Signature(s) Signed: 10/17/2016 4:56:18 PM By: [name] Entered By: [name] on 10/17/2016 09:33:56 Zeringue, [name] ([id number]) -------------------------------------------------------------------------------- Patient/Caregiver Education Details [name]. Date of Service: 10/17/2016 9:15 AM Medical Record Number: [id number] Patient Account Number: [id number] Date of Birth/Gender: [name] (61 y.o. Female) Treating RN: [name] Primary Care Physician: [name] Other Clinician: Referring Physician: Nicky [name] Treating Physician/Extender: [name] in Treatment: 1 Education Assessment Education Provided To: Patient Education Topics Provided Wound/Skin Impairment: Handouts: Other: have SNF RN put gauze under BFD Methods: Explain/Verbal Responses: State content correctly Electronic Signature(s) Signed: 10/17/2016 4:56:18 PM By: [name] Entered By: [name] on 10/17/2016 10:05:19 [name], [name]  ([id number]) -------------------------------------------------------------------------------- Wound Assessment Details [name], [name] Date of Service: 10/17/2016 9:15 AM Medical Record Number: [id number] Patient Account Number: [id number] Date of Birth/Sex: [name] (61 y.o. Female) Treating RN: [name] Primary Care Trevan Messman: [name] Other Clinician: Referring Isaia Hassell: [name] Treating Phong Isenberg/Extender: [name] in Treatment: 1 Wound Status Wound Number: 10 Primary Trauma, Other Etiology: Wound Location: Right Malleolus - Lateral Wound Open Wounding Event: Trauma Status: Date Acquired: 09/12/2016 Comorbid Glaucoma, Anemia, Coronary Artery Weeks Of Treatment: 1 History: Disease, Hypertension, Type II Clustered Wound: No Diabetes, End Stage Renal Disease, Osteoarthritis, Neuropathy Photos Photo Uploaded By: [name] on 10/17/2016 11:04:44 Wound Measurements Length: (cm) 0.5 Width: (cm) 0.4 Depth: (cm) 0.2 Area: (cm) 0.157 Volume: (cm) 0.031 % Reduction in Area: 44.5% % Reduction in Volume: 45.6% Epithelialization: None Tunneling: No Undermining: No Wound Description Full Thickness Without Classification: Exposed Support Structures Diabetic Severity Grade 1 (Wagner): Wound Margin: Flat and Intact Exudate Amount: Large Exudate Type: Serous Exudate Color: amber Foul Odor After Cleansing: No Slough/Fibrino Yes Wound Bed [name], [name] ([id number]) Granulation Amount: None Present (0%) Exposed Structure Necrotic Amount: Large (67-100%) Fascia Exposed: No Necrotic Quality: Eschar, Adherent Slough Fat Layer (Subcutaneous Tissue) Exposed: No Tendon Exposed: No Muscle Exposed: No Joint Exposed: No Bone Exposed: No Limited to Skin Breakdown Periwound Skin Texture Texture Color No Abnormalities Noted: No No Abnormalities Noted: No Callus: No Atrophie Blanche: No Crepitus: No Cyanosis: No Excoriation:  No Ecchymosis: No Induration: No Erythema: Yes Rash: No Erythema Location: Circumferential Scarring: No Hemosiderin Staining: No Mottled: No Moisture Pallor: No No Abnormalities Noted: No Rubor: No Dry / Scaly: No Maceration: No Temperature / Pain Temperature: No Abnormality Tenderness on Palpation: Yes Wound Preparation Ulcer Cleansing: Rinsed/Irrigated with Saline Topical Anesthetic Applied: Other: lidocaine 4%, Treatment Notes Wound #10 (Right, Lateral Malleolus) 1. Cleansed with: Clean wound with Normal Saline 2. Anesthetic Topical Lidocaine  4% cream to wound bed prior to debridement 4. Dressing Applied: Santyl Ointment 5. Secondary Dressing Applied Bordered Foam Dressing Dry Gauze Electronic Signature(s) Signed: 10/17/2016 4:56:18 PM By: [name] Entered By: [name] on 10/17/2016 09:40:31 Barcus, [name] ([id number]) -------------------------------------------------------------------------------- Vitals Details [name]. Date of Service: 10/17/2016 9:15 AM Medical Record Number: [id number] Patient Account Number: [id number] Date of Birth/Sex: [name] (61 y.o. Female) Treating RN: [name] Primary Care Emerson Schreifels: [name] Other Clinician: Referring Avarae Zwart: [name] Treating Geniva Lohnes/Extender: [name] in Treatment: 1 Vital Signs Time Taken: 09:33 Temperature (F): 98.3 Height (in): 61 Pulse (bpm): 76 Weight (lbs): 160 Respiratory Rate (breaths/min): 18 Body Mass Index (BMI): 30.2 Blood Pressure (mmHg): 149/63 Reference Range: 80 - 120 mg / dl Electronic Signature(s) Signed: 10/17/2016 4:56:18 PM By: [name] Entered By: [name] on 10/17/2016 09:34:[name]

## 2016-10-24 ENCOUNTER — Encounter: Payer: Medicare Other | Admitting: Surgery

## 2016-10-24 DIAGNOSIS — E11621 Type 2 diabetes mellitus with foot ulcer: Secondary | ICD-10-CM | POA: Diagnosis not present

## 2016-10-25 NOTE — Progress Notes (Signed)
CALEDONIA, ZOU ([id number]) Visit Report for 10/24/2016 Chief Complaint Document Details [name], [name]. Date of Service: 10/24/2016 11:00 AM Medical Record Number: [id number] Patient Account Number: [id number] Date of Birth/Sex: [birth year] (61 y.o. Female) Treating RN: Montey Hora Primary Care Provider: [name] Other Clinician: Referring Provider: [name] Treating Provider/Extender: Frann Rider in Treatment: 2 Information Obtained from: Patient Chief Complaint Patients presents for treatment of an open diabetic ulcer right lateral ankle which she's had for about a month Electronic Signature(s) Signed: 10/24/2016 11:22:44 AM By: [name], [name] Entered By: [name] on 10/24/2016 11:22:43 Czajka, [name] ([id number]) -------------------------------------------------------------------------------- Debridement Details Patient Name: [name], [name] Date of Service: 10/24/2016 11:00 AM Medical Record Number: [id number] Patient Account Number: [id number] Date of Birth/Sex: December 28, 1955 (61 y.o. Female) Treating RN: Montey Hora Primary Care Provider: [name] Other Clinician: Referring Provider: [name] Treating Provider/Extender: Frann Rider in Treatment: 2 Debridement Performed for Wound #10 Right,Lateral Malleolus Assessment: Performed By: Physician [name], MD Debridement: Debridement Pre-procedure Yes - 11:13 Verification/Time Out Taken: Start Time: 11:13 Pain Control: Lidocaine 4% Topical Solution Level: Skin/Subcutaneous Tissue Total Area Debrided (L x 0.4 (cm) x 0.3 (cm) = 0.12 (cm) W): Tissue and other Viable, Non-Viable, Fibrin/Slough, Subcutaneous material debrided: Instrument: Curette Bleeding: Minimum Hemostasis Achieved: Pressure End Time: 11:15 Procedural Pain: 0 Post Procedural Pain: 0 Response to Treatment: Procedure was tolerated well Post Debridement Measurements of Total Wound Length:  (cm) 0.4 Width: (cm) 0.3 Depth: (cm) 0.2 Volume: (cm) 0.019 Character of Wound/Ulcer Post Improved Debridement: Severity of Tissue Post Debridement: Fat layer exposed Post Procedure Diagnosis Same as Pre-procedure Electronic Signature(s) Signed: 10/24/2016 11:22:36 AM By: [name], [name] Signed: 10/24/2016 4:50:37 PM By: Montey Hora Entered By: [name] on 10/24/2016 11:22:36 Melman, [name] ([id number]) Quade, [name] ([id number]) -------------------------------------------------------------------------------- HPI Details [name]. Date of Service: 10/24/2016 11:00 AM Medical Record Number: [id number] Patient Account Number: [id number] Date of Birth/Sex: 1955-11-11 (61 y.o. Female) Treating RN: Montey Hora Primary Care Provider: [name] Other Clinician: Referring Provider: [name] Treating Provider/Extender: Frann Rider in Treatment: 2 History of Present Illness Location: right lateral ankle Quality: Patient reports No Pain. Severity: Patient states wound (s) are getting better. Duration: Patient has had the wound for > 1 months prior to seeking treatment at the wound center Context: The wound appeared gradually over time, and she thinks she might have hit it with a walker Modifying Factors: Patient is currently on renal dialysis and receives treatments 3 times weekly HPI Description: 61 year old patient known to our practice from several previous visits for various problems now comes back with an ulcerated area on her right lateral malleolus which she's had for about a month and she thinks she may have hit it with her walker. No other problems and no x-ray of this has been taken. She has a past medical history of atrial fibrillation, carotid artery disease, CHF, chronic kidney disease on hemodialysis with a functioning AV fistula seen by Dr. Corene Cornea dew, diabetes mellitus with peripheral neuropathy, pancreatitis and is status  post abdominal hysterectomy, AV fistula placement in June 2016, back surgery, cholecystectomy, coronary angioplasty,several surgeries for AV fistula, and dialysis catheters. She has never been a smoker. 10/17/2016 -- had a right ankle x-ray which showed no acute pathology of the right ankle. Arterial vascular calcifications are noted and there is generalized osteopenia and demineralization ===== Old notes: 61 year old patient who is known to be a diabetic and has  end-stage renal disease has had several comorbidities including coronary artery disease, hypertension, hyperlipidemia, pancreatitis, anemia, previous history of hysterectomy, cholecystectomy, left-sided salivary gland excision, bilateral cataract surgery,Peritoneal dialysis catheter, hemodialysis catheter. the area on the back has also been caused by instant pressure she used to sleep on a recliner all day and has significant kyphoscoliosis. As far as the wound on her right lower extremity she's not sure how this blister occurred but she thought it has been there for about 2 weeks. No recent blood investigations available and no recent hemoglobin A1c. 12/30/2014 -- she is an assisted living facility but I believe the nurses that have not followed instructions as she had some cream applied on her back and there was a different dressing. Last week she's had a AV fistula placed on her left forearm. 01/13/2015 -- she has had some localized infection at the port site and she's been on doxycycline for this. 02/05/2015 - he has developed a small blister on her right lower extremity. 02/10/2015 -- she has developed another small blister on her right anterior chest wall in the area where she's had tape for her dialysis access. this may just be injury caused by a tape burn. [name] ([id number]) She had a dermatology opinion and they have taken a biopsy of her skin. She also had a left brachial AV fistula placed this  week. ========= Electronic Signature(s) Signed: 10/24/2016 11:22:58 AM By: [name], [name] Entered By: [name] on 10/24/2016 11:22:58 Christoph, [name] ([id number]) -------------------------------------------------------------------------------- Physical Exam Details Patient Name: Ashe, 38. Date of Service: 10/24/2016 11:00 AM Medical Record Number: [id number] Patient Account Number: [id number] Date of Birth/Sex: 1956/04/01 (61 y.o. Female) Treating RN: Montey Hora Primary Care Provider: [name] Other Clinician: Referring Provider: [name] Treating Provider/Extender: Frann Rider in Treatment: 2 Constitutional . Pulse regular. Respirations normal and unlabored. Afebrile. . Eyes Nonicteric. Reactive to light. Ears, Nose, Mouth, and Throat Lips, teeth, and gums WNL.Marland Kitchen Moist mucosa without lesions. Neck supple and nontender. No palpable supraclavicular or cervical adenopathy. Normal sized without goiter. Respiratory WNL. No retractions.. Breath sounds WNL, No rubs, rales, rhonchi, or wheeze.. Cardiovascular Heart rhythm and rate regular, no murmur or gallop.. Pedal Pulses WNL. No clubbing, cyanosis or edema. Chest Breasts symmetical and no nipple discharge.. Breast tissue WNL, no masses, lumps, or tenderness.. Lymphatic No adneopathy. No adenopathy. No adenopathy. Musculoskeletal Adexa without tenderness or enlargement.. Digits and nails w/o clubbing, cyanosis, infection, petechiae, ischemia, or inflammatory conditions.. Integumentary (Hair, Skin) No suspicious lesions. No crepitus or fluctuance. No peri-wound warmth or erythema. No masses.Marland Kitchen Psychiatric Judgement and insight Intact.. No evidence of depression, anxiety, or agitation.. Notes the right lateral ankle needed sharp debridement with a #3 curet and some of the subcutaneous tibia was removed sharply and there was no bleeding. It does not probe down to bone. Electronic  Signature(s) Signed: 10/24/2016 11:23:31 AM By: [name], [name] Entered By: [name] on 10/24/2016 11:23:30 Whitehair, [name] ([id number]) -------------------------------------------------------------------------------- Physician Orders Details Patient Name: [name] Date of Service: 10/24/2016 11:00 AM Medical Record Number: [id number] Patient Account Number: [id number] Date of Birth/Sex: 10/31/1955 (61 y.o. Female) Treating RN: Montey Hora Primary Care Provider: [name] Other Clinician: Referring Provider: [name] Treating Provider/Extender: Frann Rider in Treatment: 2 Verbal / Phone Orders: No Diagnosis Coding Wound Cleansing Wound #10 Right,Lateral Malleolus o Clean wound with Normal Saline. o May Shower, gently pat wound dry prior to applying new dressing. Anesthetic Wound #10 Right,Lateral Malleolus   o Topical Lidocaine 4% cream applied to wound bed prior to debridement Primary Wound Dressing Wound #10 Right,Lateral Malleolus o Santyl Ointment Secondary Dressing Wound #10 Right,Lateral Malleolus o Dry Gauze o Boardered Foam Dressing Dressing Change Frequency Wound #10 Right,Lateral Malleolus o Change dressing every day. Follow-up Appointments Wound #10 Right,Lateral Malleolus o Return Appointment in 1 week. Edema Control Wound #10 Right,Lateral Malleolus o Patient to wear own compression stockings Medications-please add to medication list. Wound #10 Right,Lateral Malleolus o Santyl Enzymatic Ointment KALI, DEADWYLER ([id number]) Electronic Signature(s) Signed: 10/24/2016 4:12:24 PM By: [name], [name] Signed: 10/24/2016 4:50:37 PM By: Montey Hora Entered By: Montey Hora on 10/24/2016 11:15:17 Merkle, [name] ([id number]) -------------------------------------------------------------------------------- Problem List Details Patient Name: Paynter, Makaila S. Date of Service: 10/24/2016 11:00 AM Medical Record  Number: [id number] Patient Account Number: [id number] Date of Birth/Sex: 02/05/1956 (61 y.o. 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 Date of Service: 10/24/2016 11:00 AM Medical Record Number: [id number] Patient Account Number: [id number] Date of Birth/Sex: Sep 23, 1955 (61 y.o. Female) Treating RN: Montey Hora Primary Care Provider: [name] Other Clinician: Referring Provider: [name] Treating Provider/Extender: Frann Rider in Treatment: 2 Subjective Chief Complaint Information obtained from Patient Patients presents for treatment of an open diabetic ulcer right lateral ankle which she's had for about a month History of Present Illness (HPI) The following HPI elements were documented for the patient's wound: Location: right lateral ankle Quality: Patient reports No Pain. Severity: Patient states wound (s) are getting better. Duration: Patient has had the wound for > 1 months prior to seeking treatment at the wound center Context: The wound appeared gradually over time, and she thinks she might have hit it with a walker Modifying Factors: Patient is currently on renal dialysis and  receives treatments 3 times weekly 61 year old patient known to our practice from several previous visits for various problems now comes back with an ulcerated area on her right lateral malleolus which she's had for about a month and she thinks she may have hit it with her walker. No other problems and no x-ray of this has been taken. She has a past medical history of atrial fibrillation, carotid artery disease, CHF, chronic kidney disease on hemodialysis with a functioning AV fistula seen by Dr. Corene Cornea dew, diabetes mellitus with peripheral neuropathy, pancreatitis and is status post abdominal hysterectomy, AV fistula placement in June 2016, back surgery, cholecystectomy, coronary angioplasty,several surgeries for AV fistula, and dialysis catheters. She has never been a smoker. 10/17/2016 -- had a right ankle x-ray which showed no acute pathology of the right ankle. Arterial vascular calcifications are noted and there is generalized osteopenia and demineralization ===== Old notes: 61 year old patient who is known to be a diabetic and has end-stage renal disease has had several comorbidities including coronary artery disease, hypertension, hyperlipidemia, pancreatitis, anemia, previous history of hysterectomy, cholecystectomy, left-sided salivary gland excision, bilateral cataract surgery,Peritoneal dialysis catheter, hemodialysis catheter. the area on  the back has also been caused by instant pressure she used to sleep on a recliner all day and has significant kyphoscoliosis. As far as the wound on her right lower extremity she's not sure how this blister occurred but she thought it has been there for about 2 weeks. No recent blood investigations available and no recent hemoglobin A1c. Bogden, JACQUITA MULHEARN ([id number]) 12/30/2014 -- she is an assisted living facility but I believe the nurses that have not followed instructions as she had some cream applied on her back and there was a different  dressing. Last week she's had a AV fistula placed on her left forearm. 01/13/2015 -- she has had some localized infection at the port site and she's been on doxycycline for this. 02/05/2015 - he has developed a small blister on her right lower extremity. 02/10/2015 -- she has developed another small blister on her right anterior chest wall in the area where she's had tape for her dialysis access. this may just be injury caused by a tape burn. She had a dermatology opinion and they have taken a biopsy of her skin. She also had a left brachial AV fistula placed this week. ========= Objective Constitutional Pulse regular. Respirations normal and unlabored. Afebrile. Vitals Time Taken: 10:56 AM, Height: 61 in, Weight: 160 lbs, BMI: 30.2, Temperature: 98.1 F, Pulse: 72 bpm, Respiratory Rate: 20 breaths/min, Blood Pressure: 145/63 mmHg, Pulse Oximetry: 93 %. Eyes Nonicteric. Reactive to light. Ears, Nose, Mouth, and Throat Lips, teeth, and gums WNL.Marland Kitchen Moist mucosa without lesions. Neck supple and nontender. No palpable supraclavicular or cervical adenopathy. Normal sized without goiter. Respiratory WNL. No retractions.. Breath sounds WNL, No rubs, rales, rhonchi, or wheeze.. Cardiovascular Heart rhythm and rate regular, no murmur or gallop.. Pedal Pulses WNL. No clubbing, cyanosis or edema. Chest Breasts symmetical and no nipple discharge.. Breast tissue WNL, no masses, lumps, or tenderness.. Lymphatic No adneopathy. No adenopathy. No adenopathy. Musculoskeletal Adexa without tenderness or enlargement.. Digits and nails w/o clubbing, cyanosis, infection, petechiae, ischemia, or inflammatory conditions.[name] ([id number]) Psychiatric Judgement and insight Intact.. No evidence of depression, anxiety, or agitation.. General Notes: the right lateral ankle needed sharp debridement with a #3 curet and some of the subcutaneous tibia was removed sharply and there was no bleeding. It  does not probe down to bone. Integumentary (Hair, Skin) No suspicious lesions. No crepitus or fluctuance. No peri-wound warmth or erythema. No masses.. Wound #10 status is Open. Original cause of wound was Trauma. The wound is located on the Right,Lateral Malleolus. The wound measures 0.4cm length x 0.3cm width x 0.2cm depth; 0.094cm^2 area and 0.019cm^3 volume. The wound is limited to skin breakdown. There is no tunneling or undermining noted. There is a large amount of serous drainage noted. The wound margin is flat and intact. There is no granulation within the wound bed. There is a large (67-100%) amount of necrotic tissue within the wound bed including Eschar and Adherent Slough. The periwound skin appearance exhibited: Erythema. The periwound skin appearance did not exhibit: Callus, Crepitus, Excoriation, Induration, Rash, Scarring, Dry/Scaly, Maceration, Atrophie Blanche, Cyanosis, Ecchymosis, Hemosiderin Staining, Mottled, Pallor, Rubor. The surrounding wound skin color is noted with erythema which is circumferential. Periwound temperature was noted as No Abnormality. The periwound has tenderness on palpation. Assessment Active Problems ICD-10 E11.621 - Type 2 diabetes mellitus with foot ulcer N18.6 - End stage renal disease Z99.2 - Dependence on renal dialysis L97.312 - Non-pressure chronic ulcer of right ankle with fat layer exposed Procedures Wound #  10 Wound #10 is a Trauma, Other located on the Right,Lateral Malleolus . There was a Skin/Subcutaneous Tissue Debridement ([id number]) debridement with total area of 0.12 sq cm performed by [name], MD. with the following instrument(s): Curette to remove Viable and Non-Viable tissue/material including Fibrin/Slough and Subcutaneous after achieving pain control using Lidocaine 4% Topical Solution. A time out was conducted at 11:13, prior to the start of the procedure. A Minimum amount of bleeding was controlled with Pressure.  The procedure was tolerated well with a pain level of 0 throughout and a pain level of 0 following the procedure. Post Debridement Measurements: 0.4cm length x 0.3cm width x 0.2cm depth; Kozlowski, Keiry S. ([id number]) 0.019cm^3 volume. Character of Wound/Ulcer Post Debridement is improved. Severity of Tissue Post Debridement is: Fat layer exposed. Post procedure Diagnosis Wound #10: Same as Pre-Procedure Plan Wound Cleansing: Wound #10 Right,Lateral Malleolus: Clean wound with Normal Saline. May Shower, gently pat wound dry prior to applying new dressing. Anesthetic: Wound #10 Right,Lateral Malleolus: Topical Lidocaine 4% cream applied to wound bed prior to debridement Primary Wound Dressing: Wound #10 Right,Lateral Malleolus: Santyl Ointment Secondary Dressing: Wound #10 Right,Lateral Malleolus: Dry Gauze Boardered Foam Dressing Dressing Change Frequency: Wound #10 Right,Lateral Malleolus: Change dressing every day. Follow-up Appointments: Wound #10 Right,Lateral Malleolus: Return Appointment in 1 week. Edema Control: Wound #10 Right,Lateral Malleolus: Patient to wear own compression stockings Medications-please add to medication list.: Wound #10 Right,Lateral Malleolus: Santyl Enzymatic Ointment After review have recommended: 1. Santyl ointment locally to be applied daily with a bordered foam. 2. Offloading has been discussed with her 3. adequate protein, vitamin A, vitamin C and zinc Sills, Kayana S. ([id number]) 4. Regular review is at the wound center Electronic Signature(s) Signed: 10/24/2016 11:24:03 AM By: [name], [name] Entered By: [name] on 10/24/2016 11:24:03 Stonesifer, [name] ([id number]) -------------------------------------------------------------------------------- SuperBill Details Patient Name: [name] Date of Service: 10/24/2016 Medical Record Number: [id number] Patient Account Number: [id number] Date of Birth/Sex: [birth year] (61 y.o.  Female) Treating RN: Montey Hora Primary Care Provider: [name] Other Clinician: Referring Provider: [name] Treating Provider/Extender: Frann Rider in Treatment: 2 Diagnosis Coding ICD-10 Codes Code Description E11.621 Type 2 diabetes mellitus with foot ulcer N18.6 End stage renal disease Z99.2 Dependence on renal dialysis L97.312 Non-pressure chronic ulcer of right ankle with fat layer exposed Facility Procedures CPT4 Code Description: [id number] 11042 - DEB SUBQ TISSUE 20 SQ CM/< ICD-10 Description Diagnosis E11.621 Type 2 diabetes mellitus with foot ulcer N18.6 End stage renal disease Z99.2 Dependence on renal dialysis L97.312 Non-pressure chronic ulcer of right  ankle with fat Modifier: layer expose Quantity: 1 d Physician Procedures CPT4 Code Description: [id number] - WC PHYS SUBQ TISS 20 SQ CM ICD-10 Description Diagnosis E11.621 Type 2 diabetes mellitus with foot ulcer N18.6 End stage renal disease Z99.2 Dependence on renal dialysis L97.312 Non-pressure chronic ulcer of right  ankle with fat Modifier: layer expose Quantity: 1 d Electronic Signature(s) Signed: 10/24/2016 11:24:18 AM By: [name], [name] Entered By: [name] on 10/24/2016 11:24:18

## 2016-10-25 NOTE — Progress Notes (Signed)
[name], [name] ([id number]) Visit Report for 10/24/2016 Arrival Information Details [name], [name]. Date of Service: 10/24/2016 11:00 AM Medical Record Number: [id number] Patient Account Number: [id number] Date of Birth/Sex: 1955/11/24 (61 y.o. Female) Treating RN: Montey Hora Primary Care Tyesha Joffe: [name] Other Clinician: Referring Kenia Teagarden: [name] Treating Judaea Burgoon/Extender: Frann Rider in Treatment: 2 Visit Information History Since Last Visit Added or deleted any medications: No Patient Arrived: Walker Any new allergies or adverse reactions: No Arrival Time: 10:54 Had a fall or experienced change in No Accompanied By: self activities of daily living that may affect Transfer Assistance: None risk of falls: Patient Identification Verified: Yes Signs or symptoms of abuse/neglect since last No Secondary Verification Process Yes visito Completed: Hospitalized since last visit: No Patient Has Alerts: Yes Has Dressing in Place as Prescribed: Yes Patient Alerts: Patient on Blood Pain Present Now: No Thinner Electronic Signature(s) Signed: 10/24/2016 4:50:37 PM By: Montey Hora Entered By: Montey Hora on 10/24/2016 10:55:01 [name], [name] ([id number]) -------------------------------------------------------------------------------- Encounter Discharge Information Details [name]. Date of Service: 10/24/2016 11:00 AM Medical Record Number: [id number] Patient Account Number: [id number] Date of Birth/Sex: 04/19/1956 (61 y.o. Female) Treating RN: Montey Hora Primary Care Arista Kettlewell: [name] Other Clinician: Referring Sherian Valenza: [name] Treating Kellar Westberg/Extender: Frann Rider in Treatment: 2 Encounter Discharge Information Items Discharge Pain Level: 0 Discharge Condition: Stable Ambulatory Status: [place] Discharge Destination: Home Transportation: Private Auto Accompanied By:  self Schedule Follow-up Appointment: Yes Medication Reconciliation completed No and provided to Patient/Care Carlina Derks: Provided on Clinical Summary of Care: 10/24/2016 Form Type Recipient Paper Patient LD Electronic Signature(s) Signed: 10/24/2016 11:30:11 AM By: Ruthine Dose Entered By: Ruthine Dose on 10/24/2016 11:30:11 [name], [name] ([id number]) -------------------------------------------------------------------------------- Lower Extremity Assessment Details Patient Name: [name], [name] Date of Service: 10/24/2016 11:00 AM Medical Record Number: [id number] Patient Account Number: [id number] Date of Birth/Sex: 26-May-1956 (61 y.o. Female) Treating RN: Montey Hora Primary Care Altair Appenzeller: [name] Other Clinician: Referring Samual [name]: [name] Treating Nayson Traweek/Extender: Frann Rider in Treatment: 2 Vascular Assessment Pulses: Dorsalis Pedis Palpable: [Right:Yes] Posterior Tibial Extremity colors, hair growth, and conditions: Extremity Color: [Right:Normal] Hair Growth on Extremity: [Right:No] Temperature of Extremity: [Right:Warm] Capillary Refill: [Right:< 3 seconds] Electronic Signature(s) Signed: 10/24/2016 4:50:37 PM By: Montey Hora Entered By: Montey Hora on 10/24/2016 11:05:35 [name], [name] ([id number]) -------------------------------------------------------------------------------- Multi Wound Chart Details Patient Name: [name], [name] Date of Service: 10/24/2016 11:00 AM Medical Record Number: [id number] Patient Account Number: [id number] Date of Birth/Sex: Sep 13, 1955 (61 y.o. Female) Treating RN: Montey Hora Primary Care Dalis Beers: [name] Other Clinician: Referring Raelynn Corron: [name] Treating Ellayna Hilligoss/Extender: Frann Rider in Treatment: 2 Vital Signs Height(in): 61 Pulse(bpm): 72 Weight(lbs): 160 Blood Pressure 145/63 (mmHg): Body Mass Index(BMI): 30 Temperature(F): 98.1 Respiratory  Rate 20 (breaths/min): Photos: [10:No Photos] [N/A:N/A] Wound Location: [10:Right Malleolus - Lateral N/A] Wounding Event: [10:Trauma] [N/A:N/A] Primary Etiology: [10:Trauma, Other] [N/A:N/A] Comorbid History: [10:Glaucoma, Anemia, Coronary Artery Disease, Hypertension, Type II Diabetes, End Stage Renal Disease, Osteoarthritis, Neuropathy] [N/A:N/A] Date Acquired: [10:09/12/2016] [N/A:N/A] Weeks of Treatment: [10:2] [N/A:N/A] Wound Status: [10:Open] [N/A:N/A] Measurements L x W x D 0.4x0.3x0.2 [N/A:N/A] (cm) Area (cm) : [10:0.094] [N/A:N/A] Volume (cm) : [10:0.019] [N/A:N/A] % Reduction in Area: [10:66.80%] [N/A:N/A] % Reduction in Volume: 66.70% [N/A:N/A] Classification: [10:Full Thickness Without Exposed Support Structures] [N/A:N/A] HBO Classification: [10:Grade 1] [N/A:N/A] Exudate Amount: [10:Large] [N/A:N/A] Exudate Type: [10:Serous] [N/A:N/A] Exudate Color: [10:amber] [N/A:N/A] Wound Margin: [10:Flat and Intact] [N/A:N/A] Granulation Amount: [10:None Present (0%)] [N/A:N/A]  Necrotic Amount: [10:Large (67-100%)] [N/A:N/A] Necrotic Tissue: [10:Eschar, Adherent Slough N/A] Exposed Structures: Fascia: No N/A N/A Fat Layer (Subcutaneous Tissue) Exposed: No Tendon: No Muscle: No Joint: No Bone: No Limited to Skin Breakdown Epithelialization: None N/A N/A Debridement: Debridement (24097- N/A N/A 11047) Pre-procedure 11:13 N/A N/A Verification/Time Out Taken: Pain Control: Lidocaine 4% Topical N/A N/A Solution Tissue Debrided: Fibrin/Slough, N/A N/A Subcutaneous Level: Skin/Subcutaneous N/A N/A Tissue Debridement Area (sq 0.12 N/A N/A cm): Instrument: Curette N/A N/A Bleeding: Minimum N/A N/A Hemostasis Achieved: Pressure N/A N/A Procedural Pain: 0 N/A N/A Post Procedural Pain: 0 N/A N/A Debridement Treatment Procedure was tolerated N/A N/A Response: well Post Debridement 0.4x0.3x0.2 N/A N/A Measurements L x W x D (cm) Post Debridement 0.019 N/A  N/A Volume: (cm) Periwound Skin Texture: Excoriation: No N/A N/A Induration: No Callus: No Crepitus: No Rash: No Scarring: No Periwound Skin Maceration: No N/A N/A Moisture: Dry/Scaly: No Periwound Skin Color: Erythema: Yes N/A N/A Atrophie Blanche: No Cyanosis: No Ecchymosis: No Hemosiderin Staining: No Mottled: No Vanderploeg, [name] ([id number]) Pallor: No Rubor: No Erythema Location: Circumferential N/A N/A Temperature: No Abnormality N/A N/A Tenderness on Yes N/A N/A Palpation: Wound Preparation: Ulcer Cleansing: N/A N/A Rinsed/Irrigated with Saline Topical Anesthetic Applied: Other: lidocaine 4% Procedures Performed: Debridement N/A N/A Treatment Notes Electronic Signature(s) Signed: 10/24/2016 11:22:26 AM By: [name], [name] Entered By: [name] on 10/24/2016 11:22:26 Mayhan, [name] ([id number]) -------------------------------------------------------------------------------- [place] Details Patient Name: BERNEICE, ZETTLEMOYER. Date of Service: 10/24/2016 11:00 AM Medical Record Number: [id number] Patient Account Number: [id number] Date of Birth/Sex: 10/03/1955 (61 y.o. Female) Treating RN: Montey Hora Primary Care Alyana Kreiter: [name] Other Clinician: Referring Shekera Beavers: [name] Treating Latrish Mogel/Extender: Frann Rider in Treatment: 2 Active Inactive ` Abuse / Safety / Falls / Self Care Management Nursing Diagnoses: Potential for falls Goals: Patient will remain injury free Date Initiated: 10/10/2016 Target Resolution Date: 12/23/2016 Goal Status: Active Interventions: Assess fall risk on admission and as needed Notes: ` Nutrition Nursing Diagnoses: Potential for alteratiion in Nutrition/Potential for imbalanced nutrition Goals: Patient/caregiver agrees to and verbalizes understanding of need to use nutritional supplements and/or vitamins as prescribed Date Initiated: 10/10/2016 Target Resolution Date:  10/21/2016 Goal Status: Active Interventions: Assess patient nutrition upon admission and as needed per policy Notes: ` Orientation to the Wound Care Program Nursing Diagnoses: Knowledge deficit related to the wound healing center program Hoxworth, [name] ([id number]) Goals: Patient/caregiver will verbalize understanding of the [place] Date Initiated: 10/10/2016 Target Resolution Date: 12/23/2016 Goal Status: Active Interventions: Provide education on orientation to the wound center Notes: ` Wound/Skin Impairment Nursing Diagnoses: Impaired tissue integrity Goals: Patient/caregiver will verbalize understanding of skin care regimen Date Initiated: 10/10/2016 Target Resolution Date: 12/23/2016 Goal Status: Active Ulcer/skin breakdown will have a volume reduction of 30% by week 4 Date Initiated: 10/10/2016 Target Resolution Date: 12/23/2016 Goal Status: Active Ulcer/skin breakdown will have a volume reduction of 50% by week 8 Date Initiated: 10/10/2016 Target Resolution Date: 12/23/2016 Goal Status: Active Ulcer/skin breakdown will have a volume reduction of 80% by week 12 Date Initiated: 10/10/2016 Target Resolution Date: 12/23/2016 Goal Status: Active Ulcer/skin breakdown will heal within 14 weeks Date Initiated: 10/10/2016 Target Resolution Date: 12/23/2016 Goal Status: Active Interventions: Assess patient/caregiver ability to obtain necessary supplies Assess patient/caregiver ability to perform ulcer/skin care regimen upon admission and as needed Assess ulceration(s) every visit Notes: Electronic Signature(s) Signed: 10/24/2016 4:50:37 PM By: Montey Hora Entered By: Montey Hora on 10/24/2016 11:05:42 Razzano, [name]  Chauncey [name] ([id number]) -------------------------------------------------------------------------------- Pain Assessment Details [name], [name]. Date of Service: 10/24/2016 11:00 AM Medical Record Number: [id number] Patient Account Number:  [id number] Date of Birth/Sex: 20-Sep-1955 (61 y.o. Female) Treating RN: Montey Hora Primary Care Deva Ron: [name] Other Clinician: Referring Rebie Peale: [name] Treating Melany Wiesman/Extender: Frann Rider in Treatment: 2 Active Problems Location of Pain Severity and Description of Pain Patient Has Paino No Site Locations Pain Management and Medication Current Pain Management: Notes Topical or injectable lidocaine is offered to patient for acute pain when surgical debridement is performed. If needed, Patient is instructed to use over the counter pain medication for the following 24-48 hours after debridement. Wound care MDs do not prescribed pain medications. Patient has chronic pain or uncontrolled pain. Patient has been instructed to make an appointment with their Primary Care Physician for pain management. Electronic Signature(s) Signed: 10/24/2016 4:50:37 PM By: Montey Hora Entered By: Montey Hora on 10/24/2016 10:55:09 Vice, [name] ([id number]) -------------------------------------------------------------------------------- Patient/Caregiver Education Details [name]. Date of Service: 10/24/2016 11:00 AM Medical Record Number: [id number] Patient Account Number: [id number] Date of Birth/Gender: 03-03-1956 (61 y.o. Female) Treating RN: Montey Hora Primary Care Physician: [name] Other Clinician: Referring Physician: [name] Treating Physician/Extender: Frann Rider in Treatment: 2 Education Assessment Education Provided To: Patient Education Topics Provided Wound/Skin Impairment: Handouts: Other: wound care to continue as ordered Methods: Demonstration, Explain/Verbal Responses: State content correctly Electronic Signature(s) Signed: 10/24/2016 4:50:37 PM By: Montey Hora Entered By: Montey Hora on 10/24/2016 11:13:29 [place]inford, [name]  ([id number]) -------------------------------------------------------------------------------- Wound Assessment Details Patient Name: Pamer, [name] Date of Service: 10/24/2016 11:00 AM Medical Record Number: [id number] Patient Account Number: [id number] Date of Birth/Sex: 1956/05/11 (61 y.o. Female) Treating RN: Montey Hora Primary Care Nonnie Pickney: [name] Other Clinician: Referring Jaclynn Laumann: [name] Treating Chrishaun Sasso/Extender: Frann Rider in Treatment: 2 Wound Status Wound Number: 10 Primary Trauma, Other Etiology: Wound Location: Right Malleolus - Lateral Wound Open Wounding Event: Trauma Status: Date Acquired: 09/12/2016 Comorbid Glaucoma, Anemia, Coronary Artery Weeks Of Treatment: 2 History: Disease, Hypertension, Type II Clustered Wound: No Diabetes, End Stage Renal Disease, Osteoarthritis, Neuropathy Photos Photo Uploaded By: Montey Hora on 10/24/2016 12:41:30 Wound Measurements Length: (cm) 0.4 Width: (cm) 0.3 Depth: (cm) 0.2 Area: (cm) 0.094 Volume: (cm) 0.019 % Reduction in Area: 66.8% % Reduction in Volume: 66.7% Epithelialization: None Tunneling: No Undermining: No Wound Description Full Thickness Without Classification: Exposed Support Structures Diabetic Severity Grade 1 (Wagner): Wound Margin: Flat and Intact Exudate Amount: Large Exudate Type: Serous Exudate Color: amber Foul Odor After Cleansing: No Slough/Fibrino Yes Wound Bed Zoeller, [name] ([id number]) Granulation Amount: None Present (0%) Exposed Structure Necrotic Amount: Large (67-100%) Fascia Exposed: No Necrotic Quality: Eschar, Adherent Slough Fat Layer (Subcutaneous Tissue) Exposed: No Tendon Exposed: No Muscle Exposed: No Joint Exposed: No Bone Exposed: No Limited to Skin Breakdown Periwound Skin Texture Texture Color No Abnormalities Noted: No No Abnormalities Noted: No Callus: No Atrophie Blanche: No Crepitus: No Cyanosis: No Excoriation:  No Ecchymosis: No Induration: No Erythema: Yes Rash: No Erythema Location: Circumferential Scarring: No Hemosiderin Staining: No Mottled: No Moisture Pallor: No No Abnormalities Noted: No Rubor: No Dry / Scaly: No Maceration: No Temperature / Pain Temperature: No Abnormality Tenderness on Palpation: Yes Wound Preparation Ulcer Cleansing: Rinsed/Irrigated with Saline Topical Anesthetic Applied: Other: lidocaine 4%, Treatment Notes Wound #10 (Right, Lateral Malleolus) 1. Cleansed with: Clean wound with Normal Saline 4. Dressing Applied: Santyl Ointment 5. Secondary Dressing Applied Bordered Foam Dressing Dry  Gauze Electronic Signature(s) Signed: 10/24/2016 4:50:37 PM By: Montey Hora Entered By: Montey Hora on 10/24/2016 11:05:13 Hoobler, [name] ([id number]) -------------------------------------------------------------------------------- Vitals Details [name]. Date of Service: 10/24/2016 11:00 AM Medical Record Number: [id number] Patient Account Number: [id number] Date of Birth/Sex: 1956/07/14 (61 y.o. Female) Treating RN: Montey Hora Primary Care Aviyon Hocevar: [name] Other Clinician: Referring Richards Pherigo: [name] Treating Yesha Muchow/Extender: Frann Rider in Treatment: 2 Vital Signs Time Taken: 10:56 Temperature (F): 98.1 Height (in): 61 Pulse (bpm): 72 Weight (lbs): 160 Respiratory Rate (breaths/min): 20 Body Mass Index (BMI): 30.2 Blood Pressure (mmHg): 145/63 Reference Range: 80 - 120 mg / dl Pulse Oximetry (%): 93 Electronic Signature(s) Signed: 10/24/2016 4:50:37 PM By: Montey Hora Entered By: Montey Hora on 10/24/2016 10:58:19

## 2016-10-31 ENCOUNTER — Encounter: Payer: Medicare Other | Admitting: Surgery

## 2016-10-31 DIAGNOSIS — E11621 Type 2 diabetes mellitus with foot ulcer: Secondary | ICD-10-CM | POA: Diagnosis not present

## 2016-10-31 NOTE — Progress Notes (Signed)
[name], [name] ([id number]) Visit Report for 10/31/2016 Arrival Information Details [name], [name]. Date of Service: 10/31/2016 9:15 AM Medical Record Number: [id number] Patient Account Number: [id number] Date of Birth/Sex: 1956-05-31 (61 y.o. Female) Treating RN: Cornell Barman Primary Care Musa Rewerts: [name] Other Clinician: Referring Tayah Idrovo: [name] Treating Tsuruko Murtha/Extender: Frann Rider in Treatment: 3 Visit Information History Since Last Visit Added or deleted any medications: No Patient Arrived: Walker Any new allergies or adverse reactions: No Arrival Time: 09:11 Had a fall or experienced change in No Accompanied By: self activities of daily living that may affect Transfer Assistance: Manual risk of falls: Patient Identification Verified: Yes Signs or symptoms of abuse/neglect since last No Secondary Verification Process Yes visito Completed: Hospitalized since last visit: No Patient Has Alerts: Yes Has Dressing in Place as Prescribed: Yes Patient Alerts: Patient on Blood Pain Present Now: No Thinner Electronic Signature(s) Signed: 10/31/2016 2:03:46 PM By: Gretta Cool, RN, [name], Kim RN, [name] Entered By: Gretta Cool, RN, [name], Kim on 10/31/2016 09:12:06 [place], [name] ([id number]) -------------------------------------------------------------------------------- Encounter Discharge Information Details [name], [name]. Date of Service: 10/31/2016 9:15 AM Medical Record Number: [id number] Patient Account Number: [id number] Date of Birth/Sex: 1955-08-18 (61 y.o. Female) Treating RN: Cornell Barman Primary Care Bobbie Virden: [name] Other Clinician: Referring Tyjay Galindo: [name] Treating Santino Kinsella/Extender: Frann Rider in Treatment: 3 Encounter Discharge Information Items Discharge Pain Level: 0 Discharge Condition: Stable Ambulatory Status: Walker Discharge Destination: Home Private Transportation: Auto Accompanied By:  self Schedule Follow-up Appointment: Yes Medication Reconciliation completed and Yes provided to Patient/Care Neville Walston: Clinical Summary of Care: Electronic Signature(s) Signed: 10/31/2016 2:03:46 PM By: Gretta Cool, RN, [name], Kim RN, [name] Entered By: Gretta Cool, RN, [name], Kim on 10/31/2016 09:31:09 Rogala, [name] ([id number]) -------------------------------------------------------------------------------- Lower Extremity Assessment Details [name], [name]. Date of Service: 10/31/2016 9:15 AM Medical Record Number: [id number] Patient Account Number: [id number] Date of Birth/Sex: [birth year] (61 y.o. Female) Treating RN: Cornell Barman Primary Care Rylin Saez: [name] Other Clinician: Referring Santresa Levett: [name] Treating Mccall Will/Extender: Frann Rider in Treatment: 3 Edema Assessment Assessed: [Left: No] [Right: No] Edema: [Left: N] [Right: o] Vascular Assessment Pulses: Dorsalis Pedis Palpable: [Right:Yes] Posterior Tibial Extremity colors, hair growth, and conditions: Extremity Color: [Right:Hyperpigmented] Hair Growth on Extremity: [Right:No] Temperature of Extremity: [Right:Warm] Capillary Refill: [Right:< 3 seconds] Toe Nail Assessment Left: Right: Thick: No Discolored: No Deformed: No Electronic Signature(s) Signed: 10/31/2016 2:03:46 PM By: Gretta Cool, RN, [name], Kim RN, [name] Entered By: Gretta Cool, RN, [name], Kim on 10/31/2016 09:17:06 [name], [name] ([id number]) -------------------------------------------------------------------------------- Multi Wound Chart Details [name]. Date of Service: 10/31/2016 9:15 AM Medical Record Number: [id number] Patient Account Number: [id number] Date of Birth/Sex: 30-Nov-1955 (61 y.o. Female) Treating RN: Cornell Barman Primary Care Malachi Suderman: [name] Other Clinician: Referring Hema Lanza: [name] Treating Ayva Veilleux/Extender: Frann Rider in Treatment: 3 Vital Signs Height(in): 61 Pulse(bpm):  95 Weight(lbs): 160 Blood Pressure 120/49 (mmHg): Body Mass Index(BMI): 30 Temperature(F): 97.9 Respiratory Rate 18 (breaths/min): Photos: [10:No Photos] [N/A:N/A] Wound Location: [10:Right Malleolus - Lateral N/A] Wounding Event: [10:Trauma] [N/A:N/A] Primary Etiology: [10:Trauma, Other] [N/A:N/A] Comorbid History: [10:Glaucoma, Anemia, Coronary Artery Disease, Hypertension, Type II Diabetes, End Stage Renal Disease, Osteoarthritis, Neuropathy] [N/A:N/A] Date Acquired: [10:09/12/2016] [N/A:N/A] Weeks of Treatment: [10:3] [N/A:N/A] Wound Status: [10:Open] [N/A:N/A] Measurements L x W x D 0.4x0.5x0.2 [N/A:N/A] (cm) Area (cm) : [10:0.157] [N/A:N/A] Volume (cm) : [10:0.031] [N/A:N/A] % Reduction in Area: [10:44.50%] [N/A:N/A] % Reduction in Volume: 45.60% [N/A:N/A] Classification: [10:Full Thickness Without Exposed Support  Structures] [N/A:N/A] HBO Classification: [10:Grade 1] [N/A:N/A] Exudate Amount: [10:Large] [N/A:N/A] Exudate Type: [10:Serous] [N/A:N/A] Exudate Color: [10:amber] [N/A:N/A] Wound Margin: [10:Flat and Intact] [N/A:N/A] Granulation Amount: [10:None Present (0%)] [N/A:N/A] Necrotic Amount: [10:Large (67-100%)] [N/A:N/A] Necrotic Tissue: [10:Eschar, Adherent Slough N/A] Exposed Structures: Fascia: No N/A N/A Fat Layer (Subcutaneous Tissue) Exposed: No Tendon: No Muscle: No Joint: No Bone: No Limited to Skin Breakdown Epithelialization: None N/A N/A Debridement: Debridement (62703- N/A N/A 11047) Pre-procedure 09:23 N/A N/A Verification/Time Out Taken: Pain Control: Other N/A N/A Tissue Debrided: Fibrin/Slough, N/A N/A Subcutaneous Level: Skin/Subcutaneous N/A N/A Tissue Debridement Area (sq 0.2 N/A N/A cm): Instrument: Curette N/A N/A Bleeding: Minimum N/A N/A Hemostasis Achieved: Pressure N/A N/A Procedural Pain: 0 N/A N/A Post Procedural Pain: 0 N/A N/A Debridement Treatment Procedure was tolerated N/A N/A Response: well Post  Debridement 0.4x0.5x0.3 N/A N/A Measurements L x W x D (cm) Post Debridement 0.047 N/A N/A Volume: (cm) Periwound Skin Texture: Excoriation: No N/A N/A Induration: No Callus: No Crepitus: No Rash: No Scarring: No Periwound Skin Maceration: No N/A N/A Moisture: Dry/Scaly: No Periwound Skin Color: Erythema: Yes N/A N/A Atrophie Blanche: No Cyanosis: No Ecchymosis: No Hemosiderin Staining: No Mottled: No Pallor: No Rubor: No Fludd, Annabella S. ([id number]) Erythema Location: Circumferential N/A N/A Temperature: No Abnormality N/A N/A Tenderness on Yes N/A N/A Palpation: Wound Preparation: Ulcer Cleansing: N/A N/A Rinsed/Irrigated with Saline Topical Anesthetic Applied: Other: lidocaine 4% Procedures Performed: Debridement N/A N/A Treatment Notes Wound #10 (Right, Lateral Malleolus) 1. Cleansed with: Clean wound with Normal Saline 2. Anesthetic Topical Lidocaine 4% cream to wound bed prior to debridement 4. Dressing Applied: Santyl Ointment 5. Secondary Dressing Applied Bordered Foam Dressing Electronic Signature(s) Signed: 10/31/2016 9:43:30 AM By: [name], [name] Entered By: [name] on 10/31/2016 09:43:30 [name], [name] ([id number]) -------------------------------------------------------------------------------- [place] Details [name], [name]. Date of Service: 10/31/2016 9:15 AM Medical Record Number: [id number] Patient Account Number: [id number] Date of Birth/Sex: 11-21-1955 (61 y.o. Female) Treating RN: Cornell Barman Primary Care Brynden Thune: [name] Other Clinician: Referring Sandar Krinke: [name] Treating Puneet Masoner/Extender: Frann Rider in Treatment: 3 Active Inactive ` Abuse / Safety / Falls / Self Care Management Nursing Diagnoses: Potential for falls Goals: Patient will remain injury free Date Initiated: 10/10/2016 Target Resolution Date: 12/23/2016 Goal Status: Active Interventions: Assess  fall risk on admission and as needed Notes: ` Nutrition Nursing Diagnoses: Potential for alteratiion in Nutrition/Potential for imbalanced nutrition Goals: Patient/caregiver agrees to and verbalizes understanding of need to use nutritional supplements and/or vitamins as prescribed Date Initiated: 10/10/2016 Target Resolution Date: 10/21/2016 Goal Status: Active Interventions: Assess patient nutrition upon admission and as needed per policy Notes: ` Orientation to the Wound Care Program Nursing Diagnoses: Knowledge deficit related to the wound healing center program Robar, SHERIA ROSELLO ([id number]) Goals: Patient/caregiver will verbalize understanding of the [place] Date Initiated: 10/10/2016 Target Resolution Date: 12/23/2016 Goal Status: Active Interventions: Provide education on orientation to the wound center Notes: ` Wound/Skin Impairment Nursing Diagnoses: Impaired tissue integrity Goals: Patient/caregiver will verbalize understanding of skin care regimen Date Initiated: 10/10/2016 Target Resolution Date: 12/23/2016 Goal Status: Active Ulcer/skin breakdown will have a volume reduction of 30% by week 4 Date Initiated: 10/10/2016 Target Resolution Date: 12/23/2016 Goal Status: Active Ulcer/skin breakdown will have a volume reduction of 50% by week 8 Date Initiated: 10/10/2016 Target Resolution Date: 12/23/2016 Goal Status: Active Ulcer/skin breakdown will have a volume reduction of 80% by week 12 Date Initiated: 10/10/2016 Target Resolution Date:  12/23/2016 Goal Status: Active Ulcer/skin breakdown will heal within 14 weeks Date Initiated: 10/10/2016 Target Resolution Date: 12/23/2016 Goal Status: Active Interventions: Assess patient/caregiver ability to obtain necessary supplies Assess patient/caregiver ability to perform ulcer/skin care regimen upon admission and as needed Assess ulceration(s) every visit Notes: Electronic Signature(s) Signed: 10/31/2016  2:03:46 PM By: Gretta Cool, RN, [name], Kim RN, [name] Entered By: Gretta Cool, RN, [name], Kim on 10/31/2016 09:17:11 Velardi, [name] ([id number]) -------------------------------------------------------------------------------- Pain Assessment Details [name]. Date of Service: 10/31/2016 9:15 AM Medical Record Number: [id number] Patient Account Number: [id number] Date of Birth/Sex: Mar 31, 1956 (61 y.o. Female) Treating RN: Cornell Barman Primary Care Tricha Ruggirello: [name] Other Clinician: Referring Vena Bassinger: [name] Treating Kathryne Ramella/Extender: Frann Rider in Treatment: 3 Active Problems Location of Pain Severity and Description of Pain Patient Has Paino No Site Locations With Dressing Change: No Pain Management and Medication Current Pain Management: Notes Topical or injectable lidocaine is offered to patient for acute pain when surgical debridement is performed. If needed, Patient is instructed to use over the counter pain medication for the following 24-48 hours after debridement. Wound care MDs do not prescribed pain medications. Patient has chronic pain or uncontrolled pain. Patient has been instructed to make an appointment with their Primary Care Physician for pain management. Electronic Signature(s) Signed: 10/31/2016 2:03:46 PM By: Gretta Cool, RN, [name], Kim RN, [name] Entered By: Gretta Cool, RN, [name], Kim on 10/31/2016 09:12:16 [name], [name] ([id number]) -------------------------------------------------------------------------------- Patient/Caregiver Education Details [name], [facility]. Date of Service: 10/31/2016 9:15 AM Medical Record Number: [id number] Patient Account Number: [id number] Date of Birth/Gender: 1956-01-16 (61 y.o. Female) Treating RN: Cornell Barman Primary Care Physician: [name] Other Clinician: Referring Physician: [name] Treating Physician/Extender: Frann Rider in Treatment: 3 Education Assessment Education Provided  To: Patient Education Topics Provided Wound/Skin Impairment: Handouts: Caring for Your Ulcer Methods: Demonstration, Explain/Verbal Electronic Signature(s) Signed: 10/31/2016 2:03:46 PM By: Gretta Cool, RN, [name], Kim RN, [name] Entered By: Gretta Cool, RN, [name], Kim on 10/31/2016 09:31:21 [name], [name] ([id number]) -------------------------------------------------------------------------------- Wound Assessment Details [name], [name]. Date of Service: 10/31/2016 9:15 AM Medical Record Number: [id number] Patient Account Number: [id number] Date of Birth/Sex: [birth year] (61 y.o. Female) Treating RN: Cornell Barman Primary Care Sylvia Kondracki: [name] Other Clinician: Referring Dontay Harm: [name] Treating Thadeus Gandolfi/Extender: Frann Rider in Treatment: 3 Wound Status Wound Number: 10 Primary Trauma, Other Etiology: Wound Location: Right Malleolus - Lateral Wound Open Wounding Event: Trauma Status: Date Acquired: 09/12/2016 Comorbid Glaucoma, Anemia, Coronary Artery Weeks Of Treatment: 3 History: Disease, Hypertension, Type II Clustered Wound: No Diabetes, End Stage Renal Disease, Osteoarthritis, Neuropathy Photos Photo Uploaded By: Gretta Cool, RN, [name], Kim on 10/31/2016 10:04:33 Wound Measurements Length: (cm) 0.4 Width: (cm) 0.5 Depth: (cm) 0.2 Area: (cm) 0.157 Volume: (cm) 0.031 % Reduction in Area: 44.5% % Reduction in Volume: 45.6% Epithelialization: None Tunneling: No Undermining: No Wound Description Full Thickness Without Classification: Exposed Support Structures Diabetic Severity Grade 1 (Wagner): Wound Margin: Flat and Intact Exudate Amount: Large Exudate Type: Serous Exudate Color: amber Foul Odor After Cleansing: No Slough/Fibrino Yes Wound Bed Mcdougall, [name] S. ([id number]) Granulation Amount: None Present (0%) Exposed Structure Necrotic Amount: Large (67-100%) Fascia Exposed: No Necrotic Quality: Eschar, Adherent Slough Fat Layer (Subcutaneous  Tissue) Exposed: No Tendon Exposed: No Muscle Exposed: No Joint Exposed: No Bone Exposed: No Limited to Skin Breakdown Periwound Skin Texture Texture Color No Abnormalities Noted: No No Abnormalities Noted: No Callus: No Atrophie Blanche: No Crepitus: No Cyanosis: No Excoriation: No Ecchymosis: No  Induration: No Erythema: Yes Rash: No Erythema Location: Circumferential Scarring: No Hemosiderin Staining: No Mottled: No Moisture Pallor: No No Abnormalities Noted: No Rubor: No Dry / Scaly: No Maceration: No Temperature / Pain Temperature: No Abnormality Tenderness on Palpation: Yes Wound Preparation Ulcer Cleansing: Rinsed/Irrigated with Saline Topical Anesthetic Applied: Other: lidocaine 4%, Treatment Notes Wound #10 (Right, Lateral Malleolus) 1. Cleansed with: Clean wound with Normal Saline 2. Anesthetic Topical Lidocaine 4% cream to wound bed prior to debridement 4. Dressing Applied: Santyl Ointment 5. Secondary Dressing Applied Bordered Foam Dressing Electronic Signature(s) Signed: 10/31/2016 2:03:46 PM By: Gretta Cool, RN, [name], Kim RN, [name] Entered By: Gretta Cool, RN, [name], Kim on 10/31/2016 09:16:20 [place], [name] ([id number]) -------------------------------------------------------------------------------- Vitals Details [name]. Date of Service: 10/31/2016 9:15 AM Medical Record Number: [id number] Patient Account Number: [id number] Date of Birth/Sex: 05/19/1956 (61 y.o. Female) Treating RN: Cornell Barman Primary Care Bonifacio Pruden: [name] Other Clinician: Referring Mckenzie Bove: [name] Treating Alton Bouknight/Extender: Frann Rider in Treatment: 3 Vital Signs Time Taken: 09:12 Temperature (F): 97.9 Height (in): 61 Pulse (bpm): 95 Weight (lbs): 160 Respiratory Rate (breaths/min): 18 Body Mass Index (BMI): 30.2 Blood Pressure (mmHg): 120/49 Reference Range: 80 - 120 mg / dl Electronic Signature(s) Signed: 10/31/2016 2:03:46 PM By:  Gretta Cool, RN, [name], Kim RN, [name] Entered By: Gretta Cool, RN, [name], Kim on 10/31/2016 09:12:50

## 2016-11-03 NOTE — Progress Notes (Signed)
[name], [name] ([id number]) Visit Report for 10/31/2016 Chief Complaint Document Details [name], [name]. Date of Service: 10/31/2016 9:15 AM Medical Record Number: [id number] Patient Account Number: [id number] Date of Birth/Sex: 09-13-1955 (61 y.o. Female) Treating RN: Montey Hora Primary Care Provider: [name] Other Clinician: Referring Provider: [name] Treating Provider/Extender: Frann Rider in Treatment: 3 Information Obtained from: Patient Chief Complaint Patients presents for treatment of an open diabetic ulcer right lateral ankle which she's had for about a month Electronic Signature(s) Signed: 10/31/2016 9:44:02 AM By: [name], [name] Entered By: [name] on 10/31/2016 09:44:02 Linch, [name] ([id number]) -------------------------------------------------------------------------------- Debridement Details [name]. Date of Service: 10/31/2016 9:15 AM Medical Record Number: [id number] Patient Account Number: [id number] Date of Birth/Sex: 04-Jul-1956 (61 y.o. Female) Treating RN: Montey Hora Primary Care Provider: [name] Other Clinician: Referring Provider: [name] Treating Provider/Extender: Frann Rider in Treatment: 3 Debridement Performed for Wound #10 Right,Lateral Malleolus Assessment: Performed By: Physician [name], MD Debridement: Debridement Pre-procedure Yes - 09:23 Verification/Time Out Taken: Start Time: 09:24 Pain Control: Other : lidocaine 4% Level: Skin/Subcutaneous Tissue Total Area Debrided (L x 0.4 (cm) x 0.5 (cm) = 0.2 (cm) W): Tissue and other Viable, Non-Viable, Fibrin/Slough, Subcutaneous material debrided: Instrument: Curette Bleeding: Minimum Hemostasis Achieved: Pressure End Time: 09:25 Procedural Pain: 0 Post Procedural Pain: 0 Response to Treatment: Procedure was tolerated well Post Debridement Measurements of Total Wound Length: (cm)  0.4 Width: (cm) 0.5 Depth: (cm) 0.3 Volume: (cm) 0.047 Character of Wound/Ulcer Post Requires Further Debridement Debridement: Severity of Tissue Post Debridement: Fat layer exposed Post Procedure Diagnosis Same as Pre-procedure Electronic Signature(s) Signed: 10/31/2016 9:43:54 AM By: [name], [name] Signed: 11/02/2016 4:56:33 PM By: Montey Hora Previous Signature: 10/31/2016 9:43:41 AM Version By: [name], [name] Entered By: [name] on 10/31/2016 09:43:54 [name], [name] ([id number]) [name], [name] ([id number]) -------------------------------------------------------------------------------- HPI Details [name], [name]. Date of Service: 10/31/2016 9:15 AM Medical Record Number: [id number] Patient Account Number: [id number] Date of Birth/Sex: 1955-12-30 (61 y.o. Female) Treating RN: Montey Hora Primary Care Provider: [name] Other Clinician: Referring Provider: [name] Treating Provider/Extender: Frann Rider in Treatment: 3 History of Present Illness Location: right lateral ankle Quality: Patient reports No Pain. Severity: Patient states wound (s) are getting better. Duration: Patient has had the wound for > 1 months prior to seeking treatment at the wound center Context: The wound appeared gradually over time, and she thinks she might have hit it with a walker Modifying Factors: Patient is currently on renal dialysis and receives treatments 3 times weekly HPI Description: 61 year old patient known to our practice from several previous visits for various problems now comes back with an ulcerated area on her right lateral malleolus which she's had for about a month and she thinks she may have hit it with her walker. No other problems and no x-ray of this has been taken. She has a past medical history of atrial fibrillation, carotid artery disease, CHF, chronic kidney disease on hemodialysis with a functioning AV fistula  seen by Dr. Corene Cornea dew, diabetes mellitus with peripheral neuropathy, pancreatitis and is status post abdominal hysterectomy, AV fistula placement in June 2016, back surgery, cholecystectomy, coronary angioplasty,several surgeries for AV fistula, and dialysis catheters. She has never been a smoker. 10/17/2016 -- had a right ankle x-ray which showed no acute pathology of the right ankle. Arterial vascular calcifications are noted and there is generalized osteopenia and demineralization =====  Old notes: 61 year old patient who is known to be a diabetic and has end-stage renal disease has had several comorbidities including coronary artery disease, hypertension, hyperlipidemia, pancreatitis, anemia, previous history of hysterectomy, cholecystectomy, left-sided salivary gland excision, bilateral cataract surgery,Peritoneal dialysis catheter, hemodialysis catheter. the area on the back has also been caused by instant pressure she used to sleep on a recliner all day and has significant kyphoscoliosis. As far as the wound on her right lower extremity she's not sure how this blister occurred but she thought it has been there for about 2 weeks. No recent blood investigations available and no recent hemoglobin A1c. 12/30/2014 -- she is an assisted living facility but I believe the nurses that have not followed instructions as she had some cream applied on her back and there was a different dressing. Last week she's had a AV fistula placed on her left forearm. 01/13/2015 -- she has had some localized infection at the port site and she's been on doxycycline for this. 02/05/2015 - he has developed a small blister on her right lower extremity. 02/10/2015 -- she has developed another small blister on her right anterior chest wall in the area where she's had tape for her dialysis access. this may just be injury caused by a tape burn. [name] ([id number]) She had a dermatology opinion and they have  taken a biopsy of her skin. She also had a left brachial AV fistula placed this week. ========= Electronic Signature(s) Signed: 10/31/2016 9:44:08 AM By: [name], [name] Entered By: [name] on 10/31/2016 09:44:08 [name], [name] ([id number]) -------------------------------------------------------------------------------- Physical Exam Details [name], [name]. Date of Service: 10/31/2016 9:15 AM Medical Record Number: [id number] Patient Account Number: [id number] Date of Birth/Sex: [birth year] (61 y.o. Female) Treating RN: Montey Hora Primary Care Provider: [name] Other Clinician: Referring Provider: [name] Treating Provider/Extender: Frann Rider in Treatment: 3 Constitutional . Pulse regular. Respirations normal and unlabored. Afebrile. . Eyes Nonicteric. Reactive to light. Ears, Nose, Mouth, and Throat Lips, teeth, and gums WNL.Marland Kitchen Moist mucosa without lesions. Neck supple and nontender. No palpable supraclavicular or cervical adenopathy. Normal sized without goiter. Respiratory WNL. No retractions.. Breath sounds WNL, No rubs, rales, rhonchi, or wheeze.. Cardiovascular Heart rhythm and rate regular, no murmur or gallop.. Pedal Pulses WNL. No clubbing, cyanosis or edema. Chest Breasts symmetical and no nipple discharge.. Breast tissue WNL, no masses, lumps, or tenderness.. Lymphatic No adneopathy. No adenopathy. No adenopathy. Musculoskeletal Adexa without tenderness or enlargement.. Digits and nails w/o clubbing, cyanosis, infection, petechiae, ischemia, or inflammatory conditions.. Integumentary (Hair, Skin) No suspicious lesions. No crepitus or fluctuance. No peri-wound warmth or erythema. No masses.Marland Kitchen Psychiatric Judgement and insight Intact.. No evidence of depression, anxiety, or agitation.. Notes there is some subcutaneous debris which I sharply removed with a #3 curet and there is no active bleeding. It does not probe down  to bone. Electronic Signature(s) Signed: 10/31/2016 9:44:40 AM By: [name], [name] Entered By: [name] on 10/31/2016 09:44:40 [name], [name] ([id number]) -------------------------------------------------------------------------------- Physician Orders Details [name]. Date of Service: 10/31/2016 9:15 AM Medical Record Number: [id number] Patient Account Number: [id number] Date of Birth/Sex: May 04, 1956 (61 y.o. Female) Treating RN: Cornell Barman Primary Care Provider: [name] Other Clinician: Referring Provider: [name] Treating Provider/Extender: Frann Rider in Treatment: 3 Verbal / Phone Orders: No Diagnosis Coding Wound Cleansing Wound #10 Right,Lateral Malleolus o Clean wound with Normal Saline. o May Shower, gently pat wound dry prior to applying  new dressing. Anesthetic Wound #10 Right,Lateral Malleolus o Topical Lidocaine 4% cream applied to wound bed prior to debridement Primary Wound Dressing Wound #10 Right,Lateral Malleolus o Santyl Ointment Secondary Dressing Wound #10 Right,Lateral Malleolus o Dry Gauze o Boardered Foam Dressing Dressing Change Frequency Wound #10 Right,Lateral Malleolus o Change dressing every day. Follow-up Appointments Wound #10 Right,Lateral Malleolus o Return Appointment in 1 week. Edema Control Wound #10 Right,Lateral Malleolus o Patient to wear own compression stockings Medications-please add to medication list. Wound #10 Right,Lateral Malleolus o Santyl Enzymatic Ointment JANIELLE, MITTELSTADT ([id number]) Electronic Signature(s) Signed: 10/31/2016 2:03:46 PM By: Gretta Cool RN, [name], Kim RN, [name] Signed: 10/31/2016 4:10:34 PM By: [name], [name] Entered By: Gretta Cool RN, [name], Kim on 10/31/2016 09:29:51 [name], [name] ([id number]) -------------------------------------------------------------------------------- Problem List Details Patient Name: BRANDI, TOMLINSON. Date of  Service: 10/31/2016 9:15 AM Medical Record Number: [id number] Patient Account Number: [id number] Date of Birth/Sex: 1955/08/01 (62 y.o. Female) Treating RN: Montey Hora Primary Care Provider: [name] Other Clinician: Referring Provider: [name] Treating Provider/Extender: Frann Rider in Treatment: 3 Active Problems ICD-10 Encounter Code Description Active Date Diagnosis E11.621 Type 2 diabetes mellitus with foot ulcer 10/10/2016 Yes N18.6 End stage renal disease 10/10/2016 Yes Z99.2 Dependence on renal dialysis 10/10/2016 Yes L97.312 Non-pressure chronic ulcer of right ankle with fat layer 10/10/2016 Yes exposed Inactive Problems Resolved Problems Electronic Signature(s) Signed: 10/31/2016 9:43:11 AM By: [name], [name] Entered By: [name] on 10/31/2016 09:43:10 [name], [name] ([id number]) -------------------------------------------------------------------------------- Progress Note Details [name], [name] Date of Service: 10/31/2016 9:15 AM Medical Record Number: [id number] Patient Account Number: [id number] Date of Birth/Sex: Mar 28, 1956 (61 y.o. Female) Treating RN: Montey Hora Primary Care Provider: [name] Other Clinician: Referring Provider: [name] Treating Provider/Extender: Frann Rider in Treatment: 3 Subjective Chief Complaint Information obtained from Patient Patients presents for treatment of an open diabetic ulcer right lateral ankle which she's had for about a month History of Present Illness (HPI) The following HPI elements were documented for the patient's wound: Location: right lateral ankle Quality: Patient reports No Pain. Severity: Patient states wound (s) are getting better. Duration: Patient has had the wound for > 1 months prior to seeking treatment at the wound center Context: The wound appeared gradually over time, and she thinks she might have hit it with a walker Modifying Factors:  Patient is currently on renal dialysis and receives treatments 3 times weekly 61 year old patient known to our practice from several previous visits for various problems now comes back with an ulcerated area on her right lateral malleolus which she's had for about a month and she thinks she may have hit it with her walker. No other problems and no x-ray of this has been taken. She has a past medical history of atrial fibrillation, carotid artery disease, CHF, chronic kidney disease on hemodialysis with a functioning AV fistula seen by Dr. Corene Cornea dew, diabetes mellitus with peripheral neuropathy, pancreatitis and is status post abdominal hysterectomy, AV fistula placement in June 2016, back surgery, cholecystectomy, coronary angioplasty,several surgeries for AV fistula, and dialysis catheters. She has never been a smoker. 10/17/2016 -- had a right ankle x-ray which showed no acute pathology of the right ankle. Arterial vascular calcifications are noted and there is generalized osteopenia and demineralization ===== Old notes: 61 year old patient who is known to be a diabetic and has end-stage renal disease has had several comorbidities including coronary artery disease, hypertension, hyperlipidemia, pancreatitis, anemia, previous history of hysterectomy, cholecystectomy, left-sided  salivary gland excision, bilateral cataract surgery,Peritoneal dialysis catheter, hemodialysis catheter. the area on the back has also been caused by instant pressure she used to sleep on a recliner all day and has significant kyphoscoliosis. As far as the wound on her right lower extremity she's not sure how this blister occurred but she thought it has been there for about 2 weeks. No recent blood investigations available and no recent hemoglobin A1c. [name], [name] ([id number]) 12/30/2014 -- she is an assisted living facility but I believe the nurses that have not followed instructions as she had some cream applied  on her back and there was a different dressing. Last week she's had a AV fistula placed on her left forearm. 01/13/2015 -- she has had some localized infection at the port site and she's been on doxycycline for this. 02/05/2015 - he has developed a small blister on her right lower extremity. 02/10/2015 -- she has developed another small blister on her right anterior chest wall in the area where she's had tape for her dialysis access. this may just be injury caused by a tape burn. She had a dermatology opinion and they have taken a biopsy of her skin. She also had a left brachial AV fistula placed this week. ========= Objective Constitutional Pulse regular. Respirations normal and unlabored. Afebrile. Vitals Time Taken: 9:12 AM, Height: 61 in, Weight: 160 lbs, BMI: 30.2, Temperature: 97.9 F, Pulse: 95 bpm, Respiratory Rate: 18 breaths/min, Blood Pressure: 120/49 mmHg. Eyes Nonicteric. Reactive to light. Ears, Nose, Mouth, and Throat Lips, teeth, and gums WNL.Marland Kitchen Moist mucosa without lesions. Neck supple and nontender. No palpable supraclavicular or cervical adenopathy. Normal sized without goiter. Respiratory WNL. No retractions.. Breath sounds WNL, No rubs, rales, rhonchi, or wheeze.. Cardiovascular Heart rhythm and rate regular, no murmur or gallop.. Pedal Pulses WNL. No clubbing, cyanosis or edema. Chest Breasts symmetical and no nipple discharge.. Breast tissue WNL, no masses, lumps, or tenderness.. Lymphatic No adneopathy. No adenopathy. No adenopathy. Musculoskeletal Adexa without tenderness or enlargement.. Digits and nails w/o clubbing, cyanosis, infection, petechiae, ischemia, or inflammatory conditions.[name] ([id number]) Psychiatric Judgement and insight Intact.. No evidence of depression, anxiety, or agitation.. General Notes: there is some subcutaneous debris which I sharply removed with a #3 curet and there is no active bleeding. It does not probe down to  bone. Integumentary (Hair, Skin) No suspicious lesions. No crepitus or fluctuance. No peri-wound warmth or erythema. No masses.. Wound #10 status is Open. Original cause of wound was Trauma. The wound is located on the Right,Lateral Malleolus. The wound measures 0.4cm length x 0.5cm width x 0.2cm depth; 0.157cm^2 area and 0.031cm^3 volume. The wound is limited to skin breakdown. There is no tunneling or undermining noted. There is a large amount of serous drainage noted. The wound margin is flat and intact. There is no granulation within the wound bed. There is a large (67-100%) amount of necrotic tissue within the wound bed including Eschar and Adherent Slough. The periwound skin appearance exhibited: Erythema. The periwound skin appearance did not exhibit: Callus, Crepitus, Excoriation, Induration, Rash, Scarring, Dry/Scaly, Maceration, Atrophie Blanche, Cyanosis, Ecchymosis, Hemosiderin Staining, Mottled, Pallor, Rubor. The surrounding wound skin color is noted with erythema which is circumferential. Periwound temperature was noted as No Abnormality. The periwound has tenderness on palpation. Assessment Active Problems ICD-10 E11.621 - Type 2 diabetes mellitus with foot ulcer N18.6 - End stage renal disease Z99.2 - Dependence on renal dialysis L97.312 - Non-pressure chronic ulcer of right ankle with fat layer  exposed Procedures Wound #10 Wound #10 is a Trauma, Other located on the Right,Lateral Malleolus . There was a Skin/Subcutaneous Tissue Debridement ([id number]) debridement with total area of 0.2 sq cm performed by [name], MD. with the following instrument(s): Curette to remove Viable and Non-Viable tissue/material including Fibrin/Slough and Subcutaneous after achieving pain control using Other (lidocaine 4%). A time out was conducted at 09:23, prior to the start of the procedure. A Minimum amount of bleeding was controlled with Pressure. The procedure was tolerated well  with a pain level of 0 throughout and a pain level of 0 following the procedure. Post Debridement Measurements: 0.4cm length x 0.5cm width x 0.3cm depth; 0.047cm^3 Blouch, Amanie S. ([id number]) volume. Character of Wound/Ulcer Post Debridement requires further debridement. Severity of Tissue Post Debridement is: Fat layer exposed. Post procedure Diagnosis Wound #10: Same as Pre-Procedure Plan Wound Cleansing: Wound #10 Right,Lateral Malleolus: Clean wound with Normal Saline. May Shower, gently pat wound dry prior to applying new dressing. Anesthetic: Wound #10 Right,Lateral Malleolus: Topical Lidocaine 4% cream applied to wound bed prior to debridement Primary Wound Dressing: Wound #10 Right,Lateral Malleolus: Santyl Ointment Secondary Dressing: Wound #10 Right,Lateral Malleolus: Dry Gauze Boardered Foam Dressing Dressing Change Frequency: Wound #10 Right,Lateral Malleolus: Change dressing every day. Follow-up Appointments: Wound #10 Right,Lateral Malleolus: Return Appointment in 1 week. Edema Control: Wound #10 Right,Lateral Malleolus: Patient to wear own compression stockings Medications-please add to medication list.: Wound #10 Right,Lateral Malleolus: Santyl Enzymatic Ointment After review have recommended: 1. Santyl ointment locally to be applied daily with a bordered foam. 2. Offloading has been discussed with her 3. adequate protein, vitamin A, vitamin C and zinc Cullop, Danial S. ([id number]) 4. Regular review is at the wound center Electronic Signature(s) Signed: 10/31/2016 9:45:34 AM By: [name], [name] Entered By: [name] on 10/31/2016 09:45:34 Merendino, [name] ([id number]) -------------------------------------------------------------------------------- SuperBill Details [name]. Date of Service: 10/31/2016 Medical Record Number: [id number] Patient Account Number: [id number] Date of Birth/Sex: 02-08-1956 (61 y.o. Female) Treating RN:  Montey Hora Primary Care Provider: [name] Other Clinician: Referring Provider: [name] Treating Provider/Extender: Frann Rider in Treatment: 3 Diagnosis Coding ICD-10 Codes Code Description E11.621 Type 2 diabetes mellitus with foot ulcer N18.6 End stage renal disease Z99.2 Dependence on renal dialysis L97.312 Non-pressure chronic ulcer of right ankle with fat layer exposed Facility Procedures CPT4 Code Description: [id number] 11042 - DEB SUBQ TISSUE 20 SQ CM/< ICD-10 Description Diagnosis E11.621 Type 2 diabetes mellitus with foot ulcer N18.6 End stage renal disease Z99.2 Dependence on renal dialysis L97.312 Non-pressure chronic ulcer of right  ankle with fat Modifier: layer expose Quantity: 1 d Physician Procedures CPT4 Code Description: [id number] 10626 - WC PHYS SUBQ TISS 20 SQ CM ICD-10 Description Diagnosis E11.621 Type 2 diabetes mellitus with foot ulcer N18.6 End stage renal disease Z99.2 Dependence on renal dialysis L97.312 Non-pressure chronic ulcer of right  ankle with fat Modifier: layer expose Quantity: 1 d Electronic Signature(s) Signed: 10/31/2016 9:45:51 AM By: [name], [name] Entered By: [name] on 10/31/2016 09:45:51

## 2016-11-04 ENCOUNTER — Encounter: Payer: Self-pay | Admitting: Urology

## 2016-11-04 ENCOUNTER — Ambulatory Visit (INDEPENDENT_AMBULATORY_CARE_PROVIDER_SITE_OTHER): Payer: Medicare Other | Admitting: Urology

## 2016-11-04 VITALS — BP 99/63 | HR 74 | Ht 61.0 in | Wt 166.0 lb

## 2016-11-04 DIAGNOSIS — Z992 Dependence on renal dialysis: Secondary | ICD-10-CM

## 2016-11-04 DIAGNOSIS — N186 End stage renal disease: Secondary | ICD-10-CM | POA: Diagnosis not present

## 2016-11-04 DIAGNOSIS — N2889 Other specified disorders of kidney and ureter: Secondary | ICD-10-CM

## 2016-11-04 NOTE — Progress Notes (Signed)
11/04/2016 1:40 PM   [name] February 21, 1956 [id number]  Referring provider: Marden Noble, MD [address] [place], [place] [address]  Chief Complaint  Patient presents with  . New Patient (Initial Visit)    renal mass    HPI: 61 year old female with history of end-stage renal disease on hemodialysis since 2011. Presents today for follow-up of a known left renal mass.  She was previously followed by Dr. 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  More recently, she underwent CT with oral but without IV contrast ordered by her PCP, Dr. Darrow Bussing which showed a mass arising from the lower pole of the kidney measuring 2.2 x 3.2 x 3.5 cm. The lesion was not unable to be fully characterized due to lack of contrast.  It did appear moderately hypoechoic consistent with previous ablation.  Denies any flank pain or gross hematuria. She does void spontaneously every day without difficulty.  She has multiple medical comorbidities as below.  PMH: Past Medical History:  Diagnosis Date  . Atrial fibrillation ([place])   . CAD (coronary artery disease)   . CHF (congestive heart failure) ([place])   . CKD (chronic kidney disease)   . COPD (chronic obstructive pulmonary disease) ([place])   . Diabetes ([place])   . Diabetic peripheral neuropathy associated with type 2 diabetes mellitus ([place])   . Dialysis patient ([place])   . GERD (gastroesophageal reflux disease)   . HTN (hypertension)   . MI (myocardial infarction) ([place]) 05-17-2014  . Pancreatitis   . Peripheral autonomic neuropathy due to diabetes mellitus [facility])     Surgical History: Past Surgical History:  Procedure Laterality Date  . ABDOMINAL HYSTERECTOMY    . AV FISTULA PLACEMENT Left 12/25/2014   Procedure: ARTERIOVENOUS (AV) FISTULA CREATION;  Surgeon: Algernon Huxley, MD;  Location: [facility] ORS;  Service: Vascular;  Laterality: Left;  . BACK SURGERY    . CHOLECYSTECTOMY    . CORONARY ANGIOPLASTY     Cardiac  stents  . EYE SURGERY     bilateral cataract extractions  . LEFT HEART CATHETERIZATION WITH CORONARY ANGIOGRAM N/A 05/20/2014   Procedure: LEFT HEART CATHETERIZATION WITH CORONARY ANGIOGRAM;  Surgeon: Clent Demark, MD;  Location: [place] CATH LAB;  Service: Cardiovascular;  Laterality: N/A;  . PERIPHERAL VASCULAR CATHETERIZATION N/A 02/12/2015   Procedure: A/V Shuntogram/Fistulagram;  Surgeon: Algernon Huxley, MD;  Location: [place] CV LAB;  Service: Cardiovascular;  Laterality: N/A;  . PERIPHERAL VASCULAR CATHETERIZATION N/A 02/12/2015   Procedure: A/V Shunt Intervention;  Surgeon: Algernon Huxley, MD;  Location: [place] CV LAB;  Service: Cardiovascular;  Laterality: N/A;  . PERIPHERAL VASCULAR CATHETERIZATION N/A 04/09/2015   Procedure: Dialysis/Perma Catheter Removal;  Surgeon: Algernon Huxley, MD;  Location: [place] CV LAB;  Service: Cardiovascular;  Laterality: N/A;  . PORTACATH PLACEMENT      Home Medications:  Allergies as of 11/04/2016      Reactions   Adhesive [tape] Other (See Comments)   Reaction:  Blisters    Shellfish Allergy Other (See Comments)   Reaction:  Unknown    Doxycycline Rash      Medication List       Accurate as of 11/04/16 11:59 PM. Always use your most recent med list.          albuterol (2.5 MG/3ML) 0.083% NEBU 3 mL, albuterol (5 MG/ML) 0.5% NEBU 0.5 mL Inhale into the lungs.   aspirin EC 81 MG tablet Take 81 mg  by mouth daily.   hydrALAZINE 50 MG tablet Commonly known as:  APRESOLINE Take 50 mg by mouth 2 (two) times daily.   isosorbide dinitrate 30 MG tablet Commonly known as:  ISORDIL Take 30 mg by mouth daily.   labetalol 300 MG tablet Commonly known as:  NORMODYNE Take 300 mg by mouth 2 (two) times daily.   losartan 50 MG tablet Commonly known as:  COZAAR Take 50 mg by mouth 2 (two) times daily.   metoprolol succinate 100 MG 24 hr tablet Commonly known as:  TOPROL-XL Take 100 mg by mouth daily. Take with or immediately following  a meal.   TOUJEO SOLOSTAR 300 UNIT/ML Sopn Generic drug:  Insulin Glargine Inject into the skin.       Allergies:  Allergies  Allergen Reactions  . Adhesive [Tape] Other (See Comments)    Reaction:  Blisters   . Shellfish Allergy Other (See Comments)    Reaction:  Unknown   . Doxycycline Rash    Family History: Family History  Problem Relation Age of Onset  . Diabetes Mother   . Hypertension Mother   . Breast cancer Maternal Grandmother   . Breast cancer Paternal Aunt     Social History:  reports that she has never smoked. She has never used smokeless tobacco. She reports that she does not drink alcohol or use drugs.  ROS: UROLOGY Frequent Urination?: No Hard to postpone urination?: No Burning/pain with urination?: No Get up at night to urinate?: No Leakage of urine?: No Urine stream starts and stops?: No Trouble starting stream?: No Do you have to strain to urinate?: No Blood in urine?: No Urinary tract infection?: No Sexually transmitted disease?: No Injury to kidneys or bladder?: No Painful intercourse?: No Weak stream?: No Currently pregnant?: No Vaginal bleeding?: No Last menstrual period?: n  Gastrointestinal Nausea?: No Vomiting?: No Indigestion/heartburn?: No Diarrhea?: No Constipation?: No  Constitutional Fever: No Night sweats?: No Weight loss?: No Fatigue?: Yes  Skin Skin rash/lesions?: No Itching?: No  Eyes Blurred vision?: No Double vision?: No  Ears/Nose/Throat Sore throat?: No Sinus problems?: Yes  Hematologic/Lymphatic Swollen glands?: No Easy bruising?: Yes  Cardiovascular Leg swelling?: No Chest pain?: No  Respiratory Cough?: Yes Shortness of breath?: Yes  Endocrine Excessive thirst?: No  Musculoskeletal Back pain?: No Joint pain?: No  Neurological Headaches?: No Dizziness?: Yes  Psychologic Depression?: No Anxiety?: No  Physical Exam: BP 99/63   Pulse 74   Ht 5\' 1"  (1.549 m)   Wt 166 lb (75.3  kg)   BMI 31.37 kg/m   Constitutional:  Alert and oriented, No acute distress.  In wheelchair. Accompanied by husband today.  Appears older than stated age. HEENT: [place] AT, moist mucus membranes.  Trachea midline, no masses. Cardiovascular: No clubbing, cyanosis, or edema. Respiratory: Normal respiratory effort, no increased work of breathing. GI: Abdomen is soft, nontender, nondistended, no abdominal masses GU: No CVA tenderness.  Skin: No rashes, bruises or suspicious lesions.. Neurologic: Grossly intact, no focal deficits, moving all 4 extremities. Psychiatric: Normal mood and affect.  Laboratory Data: Lab Results  Component Value Date   WBC 11.3 (H) 09/30/2015   HGB 9.6 (L) 09/30/2015   HCT 29.0 (L) 09/30/2015   MCV 89.8 09/30/2015   PLT 183 09/30/2015    Lab Results  Component Value Date   CREATININE 4.21 (H) 09/30/2015    Lab Results  Component Value Date   HGBA1C 6.2 (H) 05/20/2014    Urinalysis n/a  Pertinent Imaging: Abdomen without  contrast from 08/07/2016 reviewed personally today. She is supplied imaging by disc.  Findings as above.  Assessment & Plan:    1. Renal mass History of left lower pole renal mass status post cryoablation Most recent CT imaging, the mass may have increased in size, although, without IV contrast, the lesion cannot be fully characterized and degree of enhancement is unclear I recommended proceeding with a CT abdomen with and without contrast, to be performed on the day prior to dialysis, she is agreeable to plan understands the rationale for this - CT Abd Wo & W Cm; Future  2. ESRD on dialysis [facility]) If the lesion is enhancing and has indeed increased in size since its initial ablation, will likely consider nephrectomy although she is high risk given comorbidites   Return in about 4 weeks (around 12/02/2016) for f/u CT scan.  Hollice Espy, MD  [facility] [address] [place], [place]  [id number] [phone]

## 2016-11-07 ENCOUNTER — Ambulatory Visit: Payer: Medicare Other | Admitting: Surgery

## 2016-11-11 ENCOUNTER — Encounter: Payer: Medicare Other | Admitting: Physician Assistant

## 2016-11-11 DIAGNOSIS — E11621 Type 2 diabetes mellitus with foot ulcer: Secondary | ICD-10-CM | POA: Diagnosis not present

## 2016-11-13 NOTE — Progress Notes (Signed)
[name], [name] ([id number]) Visit Report for 11/11/2016 Arrival Information Details [name], [name]. Date of Service: 11/11/2016 9:15 AM Medical Record Number: [id number] Patient Account Number: [id number] Date of Birth/Sex: 16-Mar-1956 (61 y.o. Female) Treating RN: [name] Primary Care [name]: [name] Other Clinician: Referring [name]: [name] Treating [name]/Extender: [name], [name] in Treatment: 4 Visit Information History Since Last Visit Added or deleted any medications: No Patient Arrived: [place] Any new allergies or adverse reactions: No Arrival Time: 09:15 Had a fall or experienced change in No Accompanied By: self activities of daily living that may affect Transfer Assistance: None risk of falls: Patient Identification Verified: Yes Signs or symptoms of abuse/neglect since last No Secondary Verification Process Yes visito Completed: Hospitalized since last visit: No Patient Has Alerts: Yes Has Dressing in Place as Prescribed: Yes Patient Alerts: Patient on Blood Pain Present Now: No Thinner Electronic Signature(s) Signed: 11/11/2016 5:11:48 PM By: [name] Entered By: [name] on 11/11/2016 09:15:40 [name], [name] ([id number]) -------------------------------------------------------------------------------- Encounter Discharge Information Details [name]. Date of Service: 11/11/2016 9:15 AM Medical Record Number: [id number] Patient Account Number: [id number] Date of Birth/Sex: September 26, 1955 (61 y.o. Female) Treating RN: [name] Primary Care [name]: [name] Other Clinician: Referring Joeline Freer: [name] Treating Chyanna Flock/Extender: [name], [name] in Treatment: 4 Encounter Discharge Information Items Discharge Pain Level: 0 Discharge Condition: Stable Ambulatory Status: Walker Discharge Destination: Nursing Home Transportation: Private Auto Accompanied By:  self Schedule Follow-up Appointment: Yes Medication Reconciliation completed No and provided to Patient/Care Kweku Stankey: Provided on Clinical Summary of Care: 11/11/2016 Form Type Recipient Paper Patient LD Electronic Signature(s) Signed: 11/11/2016 11:29:07 AM By: [name] Previous Signature: 11/11/2016 9:59:21 AM Version By: [name] Entered By: [name] on 11/11/2016 11:29:06 [name] ([id number]) -------------------------------------------------------------------------------- General Visit Notes Details [name], [name] Date of Service: 11/11/2016 9:15 AM Medical Record Number: [id number] Patient Account Number: [id number] Date of Birth/Sex: [birth year] (61 y.o. Female) Treating RN: [name] Primary Care Acelyn Basham: [name] Other Clinician: Referring [name]: [name] Treating [name]/Extender: [name], [name] in Treatment: 4 Notes CBG log sent to SNF via patient folder Electronic Signature(s) Signed: 11/11/2016 10:21:06 AM By: [name] Entered By: [name] on 11/11/2016 10:21:05 [name], [name] ([id number]) -------------------------------------------------------------------------------- Lower Extremity Assessment Details [name], [name] Date of Service: 11/11/2016 9:15 AM Medical Record Number: [id number] Patient Account Number: [id number] Date of Birth/Sex: 1956/04/11 (61 y.o. Female) Treating RN: [name] Primary Care Gahel Safley: [name] Other Clinician: Referring [name]: [name] Treating [name]/Extender: [name], [name] in Treatment: 4 Vascular Assessment Pulses: Dorsalis Pedis Palpable: [Right:Yes] Posterior Tibial Extremity colors, hair growth, and conditions: Extremity Color: [Right:Normal] Hair Growth on Extremity: [Right:No] Temperature of Extremity: [Right:Warm] Capillary Refill: [Right:< 3 seconds] Electronic Signature(s) Signed: 11/11/2016 5:11:48 PM  By: [name] Entered By: [name] on 11/11/2016 09:24:32 [name], [name] ([id number]) -------------------------------------------------------------------------------- Multi Wound Chart Details [name], [name] Date of Service: 11/11/2016 9:15 AM Medical Record Number: [id number] Patient Account Number: [id number] Date of Birth/Sex: 06/15/1956 (61 y.o. Female) Treating RN: [name] Primary Care [name]: [name] Other Clinician: Referring [name]: [name] Treating [name] [name] in Treatment: 4 Vital Signs Height(in): 61 Pulse(bpm): 91 Weight(lbs): 160 Blood Pressure 157/65 (mmHg): Body Mass Index(BMI): 30 Temperature(F): 97.6 Respiratory Rate 18 (breaths/min): Photos: [10:No Photos] [N/A:N/A] Wound Location: [10:Right Malleolus - Lateral N/A] Wounding Event: [10:Trauma] [N/A:N/A] Primary Etiology: [10:Trauma, Other] [N/A:N/A] Comorbid History: [  10:Glaucoma, Anemia, Coronary Artery Disease, Hypertension, Type II Diabetes, End Stage Renal Disease, Osteoarthritis, Neuropathy] [N/A:N/A] Date Acquired: [10:09/12/2016] [N/A:N/A] [name] of Treatment: [10:4] [N/A:N/A] Wound Status: [10:Open] [N/A:N/A] Measurements L x W x D 0.4x0.4x0.2 [N/A:N/A] (cm) Area (cm) : [10:0.126] [N/A:N/A] Volume (cm) : [10:0.025] [N/A:N/A] % Reduction in Area: [10:55.50%] [N/A:N/A] % Reduction in Volume: 56.10% [N/A:N/A] Classification: [10:Full Thickness Without Exposed Support Structures] [N/A:N/A] HBO Classification: [10:Grade 1] [N/A:N/A] Exudate Amount: [10:Large] [N/A:N/A] Exudate Type: [10:Serous] [N/A:N/A] Exudate Color: [10:amber] [N/A:N/A] Wound Margin: [10:Flat and Intact] [N/A:N/A] Granulation Amount: [10:None Present (0%)] [N/A:N/A] Necrotic Amount: [10:Large (67-100%)] [N/A:N/A] Necrotic Tissue: [10:Eschar, Adherent Slough N/A] Exposed Structures: Fascia: No N/A N/A Fat Layer (Subcutaneous Tissue) Exposed:  No Tendon: No Muscle: No Joint: No Bone: No Limited to Skin Breakdown Epithelialization: None N/A N/A Periwound Skin Texture: Excoriation: No N/A N/A Induration: No Callus: No Crepitus: No Rash: No Scarring: No Periwound Skin Maceration: No N/A N/A Moisture: Dry/Scaly: No Periwound Skin Color: Erythema: Yes N/A N/A Atrophie Blanche: No Cyanosis: No Ecchymosis: No Hemosiderin Staining: No Mottled: No Pallor: No Rubor: No Erythema Location: Circumferential N/A N/A Temperature: No Abnormality N/A N/A Tenderness on Yes N/A N/A Palpation: Wound Preparation: Ulcer Cleansing: N/A N/A Rinsed/Irrigated with Saline Topical Anesthetic Applied: Other: lidocaine 4% Treatment Notes Electronic Signature(s) Signed: 11/11/2016 5:11:48 PM By: [name] Entered By: [name] on 11/11/2016 09:41:11 [name], [name] ([id number]) -------------------------------------------------------------------------------- [place] Details [name]. Date of Service: 11/11/2016 9:15 AM Medical Record Number: [id number] Patient Account Number: [id number] Date of Birth/Sex: Sep 06, 1955 (62 y.o. Female) Treating RN: [name] Primary Care [name] Silva: [name] Other Clinician: Referring Acacia Latorre: [name] Treating Ariona Deschene/Extender: [name], [name] in Treatment: 4 Active Inactive ` Abuse / Safety / Falls / Self Care Management Nursing Diagnoses: Potential for falls Goals: Patient will remain injury free Date Initiated: 10/10/2016 Target Resolution Date: 12/23/2016 Goal Status: Active Interventions: Assess fall risk on admission and as needed Notes: ` Nutrition Nursing Diagnoses: Potential for alteratiion in Nutrition/Potential for imbalanced nutrition Goals: Patient/caregiver agrees to and verbalizes understanding of need to use nutritional supplements and/or vitamins as prescribed Date Initiated: 10/10/2016 Target Resolution  Date: 10/21/2016 Goal Status: Active Interventions: Assess patient nutrition upon admission and as needed per policy Notes: ` Orientation to the Wound Care Program Nursing Diagnoses: Knowledge deficit related to the wound healing center program S[place], LEMON WHITACRE ([id number]) Goals: Patient/caregiver will verbalize understanding of the [place] Date Initiated: 10/10/2016 Target Resolution Date: 12/23/2016 Goal Status: Active Interventions: Provide education on orientation to the wound center Notes: ` Wound/Skin Impairment Nursing Diagnoses: Impaired tissue integrity Goals: Patient/caregiver will verbalize understanding of skin care regimen Date Initiated: 10/10/2016 Target Resolution Date: 12/23/2016 Goal Status: Active Ulcer/skin breakdown will have a volume reduction of 30% by week 4 Date Initiated: 10/10/2016 Target Resolution Date: 12/23/2016 Goal Status: Active Ulcer/skin breakdown will have a volume reduction of 50% by week 8 Date Initiated: 10/10/2016 Target Resolution Date: 12/23/2016 Goal Status: Active Ulcer/skin breakdown will have a volume reduction of 80% by week 12 Date Initiated: 10/10/2016 Target Resolution Date: 12/23/2016 Goal Status: Active Ulcer/skin breakdown will heal within 14 [name] Date Initiated: 10/10/2016 Target Resolution Date: 12/23/2016 Goal Status: Active Interventions: Assess patient/caregiver ability to obtain necessary supplies Assess patient/caregiver ability to perform ulcer/skin care regimen upon admission and as needed Assess ulceration(s) every visit Notes: Electronic Signature(s) Signed: 11/11/2016 5:11:48 PM By: [name] Entered By: [name] on 11/11/2016 09:25:21 [name], [name] ([id number]) -------------------------------------------------------------------------------- Pain  Assessment Details [name], [name]. Date of Service: 11/11/2016 9:15 AM Medical Record Number: [id number] Patient Account  Number: [id number] Date of Birth/Sex: [birth year] (61 y.o. Female) Treating RN: [name] Primary Care Azelea Seguin: [name] Other Clinician: Referring Amrit Erck: [name] Treating Conrad Zajkowski/Extender: [name], [name] in Treatment: 4 Active Problems Location of Pain Severity and Description of Pain Patient Has Paino No Site Locations Pain Management and Medication Current Pain Management: Notes Topical or injectable lidocaine is offered to patient for acute pain when surgical debridement is performed. If needed, Patient is instructed to use over the counter pain medication for the following 24-48 hours after debridement. Wound care MDs do not prescribed pain medications. Patient has chronic pain or uncontrolled pain. Patient has been instructed to make an appointment with their Primary Care Physician for pain management. Electronic Signature(s) Signed: 11/11/2016 5:11:48 PM By: [name] Entered By: [name] on 11/11/2016 09:15:57 [name], [name] ([id number]) -------------------------------------------------------------------------------- Patient/Caregiver Education Details [name]. Date of Service: 11/11/2016 9:15 AM Medical Record Number: [id number] Patient Account Number: [id number] Date of Birth/Gender: Dec 15, 1955 (61 y.o. Female) Treating RN: [name] Primary Care Physician: [name] Other Clinician: Referring Physician: Nicky [name] Treating Physician/Extender: Sharalyn Ink in Treatment: 4 Education Assessment Education Provided To: Patient Education Topics Provided Wound/Skin Impairment: Handouts: Other: wound care as ordered Methods: Explain/Verbal Responses: State content correctly Electronic Signature(s) Signed: 11/11/2016 5:11:48 PM By: [name] Entered By: [name] on 11/11/2016 11:30:32 [name], [name]  ([id number]) -------------------------------------------------------------------------------- Wound Assessment Details [name], [name] Date of Service: 11/11/2016 9:15 AM Medical Record Number: [id number] Patient Account Number: [id number] Date of Birth/Sex: 1956-01-30 (61 y.o. Female) Treating RN: [name] Primary Care Tyishia Aune: [name] Other Clinician: Referring Laveah Gloster: [name] Treating Evaluna Utke/Extender: STONE III, [name] in Treatment: 4 Wound Status Wound Number: 10 Primary Trauma, Other Etiology: Wound Location: Right Malleolus - Lateral Wound Open Wounding Event: Trauma Status: Date Acquired: 09/12/2016 Comorbid Glaucoma, Anemia, Coronary Artery [name] Of Treatment: 4 History: Disease, Hypertension, Type II Clustered Wound: No Diabetes, End Stage Renal Disease, Osteoarthritis, Neuropathy Photos Photo Uploaded By: [name] on 11/11/2016 09:42:07 Wound Measurements Length: (cm) 0.4 Width: (cm) 0.4 Depth: (cm) 0.2 Area: (cm) 0.126 Volume: (cm) 0.025 % Reduction in Area: 55.5% % Reduction in Volume: 56.1% Epithelialization: None Tunneling: No Undermining: No Wound Description Full Thickness Without Classification: Exposed Support Structures Diabetic Severity Grade 1 ([place]): Wound Margin: Flat and Intact Exudate Amount: Large Exudate Type: Serous Exudate Color: amber Foul Odor After Cleansing: No Slough/Fibrino Yes Wound Bed Intriago, Rexann S. ([id number]) Granulation Amount: None Present (0%) Exposed Structure Necrotic Amount: Large (67-100%) Fascia Exposed: No Necrotic Quality: Eschar, Adherent Slough Fat Layer (Subcutaneous Tissue) Exposed: No Tendon Exposed: No Muscle Exposed: No Joint Exposed: No Bone Exposed: No Limited to Skin Breakdown Periwound Skin Texture Texture Color No Abnormalities Noted: No No Abnormalities Noted: No Callus: No Atrophie Blanche: No Crepitus: No Cyanosis: No Excoriation:  No Ecchymosis: No Induration: No Erythema: Yes Rash: No Erythema Location: Circumferential Scarring: No Hemosiderin Staining: No Mottled: No Moisture Pallor: No No Abnormalities Noted: No Rubor: No Dry / Scaly: No Maceration: No Temperature / Pain Temperature: No Abnormality Tenderness on Palpation: Yes Wound Preparation Ulcer Cleansing: Rinsed/Irrigated with Saline Topical Anesthetic Applied: Other: lidocaine 4%, Treatment Notes Wound #10 (Right, Lateral Malleolus) 1. Cleansed with: Clean wound with Normal Saline 2. Anesthetic Topical Lidocaine 4% cream to wound bed prior to debridement 4. Dressing Applied: Iodosorb Ointment  5. Secondary Dressing Applied Bordered Foam Dressing Dry Gauze Electronic Signature(s) Signed: 11/11/2016 5:11:48 PM By: [name] Entered By: [name] on 11/11/2016 09:24:14 Crysler, [name] ([id number]) -------------------------------------------------------------------------------- Vitals Details [name]. Date of Service: 11/11/2016 9:15 AM Medical Record Number: [id number] Patient Account Number: [id number] Date of Birth/Sex: 1956/06/08 (61 y.o. Female) Treating RN: [name] Primary Care Kylyn Mcdade: [name] Other Clinician: Referring Gracyn Allor: [name] Treating Minta Fair/Extender: [name], [name] in Treatment: 4 Vital Signs Time Taken: 09:16 Temperature (F): 97.6 Height (in): 61 Pulse (bpm): 91 Weight (lbs): 160 Respiratory Rate (breaths/min): 18 Body Mass Index (BMI): 30.2 Blood Pressure (mmHg): 157/65 Reference Range: 80 - 120 mg / dl Electronic Signature(s) Signed: 11/11/2016 5:11:48 PM By: [name] Entered By: [name] on 11/11/2016 02:11:17

## 2016-11-13 NOTE — Progress Notes (Signed)
JENNIAH, BHAVSAR ([id number]) Visit Report for 11/11/2016 Chief Complaint Document Details [name], [name]. Date of Service: 11/11/2016 9:15 AM Medical Record Number: [id number] Patient Account Number: [id number] Date of Birth/Sex: [birth year] (61 y.o. Female) Treating RN: Montey Hora Primary Care Provider: [name] Other Clinician: Referring Provider: [name] Treating Provider/Extender: Melburn Hake, Rigo Letts [name]: 4 Information Obtained from: Patient Chief Complaint Patients presents for treatment of an open diabetic ulcer right lateral ankle which she's had for about a month Electronic Signature(s) Signed: 11/11/2016 5:05:38 PM By: Worthy Keeler PA-C Entered By: Worthy Keeler on 11/11/2016 09:50:40 Pitcher, [name] ([id number]) -------------------------------------------------------------------------------- Debridement Details [name]. Date of Service: 11/11/2016 9:15 AM Medical Record Number: [id number] Patient Account Number: [id number] Date of Birth/Sex: 1956/06/20 (61 y.o. Female) Treating RN: Montey Hora Primary Care Provider: [name] Other Clinician: Referring Provider: [name] Treating Provider/Extender: Melburn Hake, Karri Kallenbach [name]: 4 Debridement Performed for Wound #10 Right,Lateral Malleolus Assessment: Performed By: Physician STONE III, Araly Kaas E., PA-C Debridement: Debridement Pre-procedure Yes - 09:42 Verification/Time Out Taken: Start Time: 09:42 Pain Control: Lidocaine 4% Topical Solution Level: Skin/Subcutaneous Tissue Total Area Debrided (L x 0.4 (cm) x 0.4 (cm) = 0.16 (cm) W): Tissue and other Viable, Non-Viable, Fibrin/Slough, Subcutaneous material debrided: Instrument: Curette Bleeding: Minimum Hemostasis Achieved: Pressure End Time: 09:44 Procedural Pain: 0 Post Procedural Pain: 0 Response to Treatment: Procedure was tolerated well Post Debridement Measurements of Total  Wound Length: (cm) 0.4 Width: (cm) 0.4 Depth: (cm) 0.3 Volume: (cm) 0.038 Character of Wound/Ulcer Post Improved Debridement: Severity of Tissue Post Debridement: Fat layer exposed Post Procedure Diagnosis Same as Pre-procedure Electronic Signature(s) Signed: 11/11/2016 5:05:38 PM By: Worthy Keeler PA-C Signed: 11/11/2016 5:11:48 PM By: Montey Hora Entered By: Montey Hora on 11/11/2016 09:44:48 Brackney, Talyia S. ([id number]) Vanrossum, Maurianna S. ([id number]) -------------------------------------------------------------------------------- HPI Details Patient Name: [name], [name]. Date of Service: 11/11/2016 9:15 AM Medical Record Number: [id number] Patient Account Number: [id number] Date of Birth/Sex: 03/15/1956 (61 y.o. Female) Treating RN: Montey Hora Primary Care Provider: [name] Other Clinician: Referring Provider: [name] Treating Provider/Extender: STONE III, Deren Degrazia [name]: 4 History of Present Illness Location: right lateral ankle Quality: Patient reports No Pain. Severity: Patient states wound (s) are getting better. Duration: Patient has had the wound for > 1 months prior to seeking treatment at the wound center Context: The wound appeared gradually over time, and she thinks she might have hit it with a walker Modifying Factors: Patient is currently on renal dialysis and receives treatments 3 times weekly HPI Description: 61 year old patient known to our practice from several previous visits for various problems now comes back with an ulcerated area on her right lateral malleolus which she's had for about a month and she thinks she may have hit it with her walker. No other problems and no x-ray of this has been taken. She has a past medical history of atrial fibrillation, carotid artery disease, CHF, chronic kidney disease on hemodialysis with a functioning AV fistula seen by Dr. Corene Cornea dew, diabetes mellitus with peripheral neuropathy,  pancreatitis and is status post abdominal hysterectomy, AV fistula placement in June 2016, back surgery, cholecystectomy, coronary angioplasty,several surgeries for AV fistula, and dialysis catheters. She has never been a smoker. 10/17/2016 -- had a right ankle x-ray which showed no acute pathology of the right ankle. Arterial vascular calcifications are noted and there is generalized osteopenia and demineralization 11/11/16 Patient appears to be doing fairly well  with regard to her right ankle wound. She has been tolerating the dressing changes with the Santyl that does seem to have loosened up the slough a bit. She has also been tolerating the debridement's when they have been performed. She tells me that she does tend to lay on her right side and this seems to be aggravating the issue. She does not have a Prevalon boot for use at this facility. Fortunately she is not having significant pain. ===== Old notes: 61 year old patient who is known to be a diabetic and has end-stage renal disease has had several comorbidities including coronary artery disease, hypertension, hyperlipidemia, pancreatitis, anemia, previous history of hysterectomy, cholecystectomy, left-sided salivary gland excision, bilateral cataract surgery,Peritoneal dialysis catheter, hemodialysis catheter. the area on the back has also been caused by instant pressure she used to sleep on a recliner all day and has significant kyphoscoliosis. As far as the wound on her right lower extremity she's not sure how this blister occurred but she thought it has been there for about 2 weeks. No recent blood investigations available and no recent hemoglobin A1c. 12/30/2014 -- she is an assisted living facility but I believe the nurses that have not followed instructions as she had some cream applied on her back and there was a different dressing. Last week she's had a AV Sugg, [name] ([id number]) fistula placed on her left  forearm. 01/13/2015 -- she has had some localized infection at the port site and she's been on doxycycline for this. 02/05/2015 - he has developed a small blister on her right lower extremity. 02/10/2015 -- she has developed another small blister on her right anterior chest wall in the area where she's had tape for her dialysis access. this may just be injury caused by a tape burn. She had a dermatology opinion and they have taken a biopsy of her skin. She also had a left brachial AV fistula placed this week. ========= Electronic Signature(s) Signed: 11/11/2016 5:05:38 PM By: Worthy Keeler PA-C Entered By: Worthy Keeler on 11/11/2016 09:53:55 Cromley, [name] ([id number]) -------------------------------------------------------------------------------- Physical Exam Details [name]. Date of Service: 11/11/2016 9:15 AM Medical Record Number: [id number] Patient Account Number: [id number] Date of Birth/Sex: 07-24-1955 (61 y.o. Female) Treating RN: Montey Hora Primary Care Provider: [name] Other Clinician: Referring Provider: [name] Treating Provider/Extender: STONE III, Gesenia Bantz [name]: 4 Constitutional Well-nourished and well-hydrated in no acute distress. Respiratory normal breathing without difficulty. Cardiovascular 1+ dorsalis pedis/posterior tibialis pulses. Psychiatric this patient is able to make decisions and demonstrates good insight into disease process. Alert and Oriented x 3. pleasant and cooperative. Electronic Signature(s) Signed: 11/11/2016 5:05:38 PM By: Worthy Keeler PA-C Entered By: Worthy Keeler on 11/11/2016 09:54:21 Cushman, [name] ([id number]) -------------------------------------------------------------------------------- Physician Orders Details [name]. Date of Service: 11/11/2016 9:15 AM Medical Record Number: [id number] Patient Account Number: [id number] Date of Birth/Sex: 04-17-1956 (61  y.o. Female) Treating RN: Montey Hora Primary Care Provider: [name] Other Clinician: Referring Provider: [name] Treating Provider/Extender: Melburn Hake, Shafer Swamy [name]: 4 Verbal / Phone Orders: No Diagnosis Coding ICD-10 Coding Code Description E11.621 Type 2 diabetes mellitus with foot ulcer N18.6 End stage renal disease Z99.2 Dependence on renal dialysis L97.312 Non-pressure chronic ulcer of right ankle with fat layer exposed Wound Cleansing Wound #10 Right,Lateral Malleolus o Clean wound with Normal Saline. o May Shower, gently pat wound dry prior to applying new dressing. Anesthetic Wound #10 Right,Lateral Malleolus o Topical Lidocaine  4% cream applied to wound bed prior to debridement Primary Wound Dressing Wound #10 Right,Lateral Malleolus o Iodosorb Ointment Secondary Dressing Wound #10 Right,Lateral Malleolus o Dry Gauze o Boardered Foam Dressing Dressing Change Frequency Wound #10 Right,Lateral Malleolus o Change dressing every other day. Follow-up Appointments Wound #10 Right,Lateral Malleolus o Return Appointment in 1 week. Edema Control Noviello, KALLYN DEMARCUS ([id number]) Wound #10 Right,Lateral Malleolus o Patient to wear own compression stockings Off-Loading Wound #10 Right,Lateral Malleolus o Other: - Please provide prevalon boot for patient to wear at night while in bed (bunny boot) Electronic Signature(s) Signed: 11/11/2016 5:05:38 PM By: Worthy Keeler PA-C Signed: 11/11/2016 5:11:48 PM By: Montey Hora Entered By: Montey Hora on 11/11/2016 09:47:15 Cerney, [name] ([id number]) -------------------------------------------------------------------------------- Problem List Details Patient Name: Plog, Shaterra S. Date of Service: 11/11/2016 9:15 AM Medical Record Number: [id number] Patient Account Number: [id number] Date of Birth/Sex: 1956/02/24 (61 y.o. Female) Treating RN: Montey Hora Primary Care Provider:  [name] Other Clinician: Referring Provider: [name] Treating Provider/Extender: Melburn Hake, Ronold Hardgrove [name]: 4 Active Problems ICD-10 Encounter Code Description Active Date Diagnosis E11.621 Type 2 diabetes mellitus with foot ulcer 10/10/2016 Yes N18.6 End stage renal disease 10/10/2016 Yes Z99.2 Dependence on renal dialysis 10/10/2016 Yes L97.312 Non-pressure chronic ulcer of right ankle with fat layer 10/10/2016 Yes exposed Inactive Problems Resolved Problems Electronic Signature(s) Signed: 11/11/2016 5:05:38 PM By: Worthy Keeler PA-C Entered By: Worthy Keeler on 11/11/2016 09:40:09 Folkert, [name] ([id number]) -------------------------------------------------------------------------------- Progress Note Details [name]. Date of Service: 11/11/2016 9:15 AM Medical Record Number: [id number] Patient Account Number: [id number] Date of Birth/Sex: 1956-05-11 (61 y.o. Female) Treating RN: Montey Hora Primary Care Provider: [name] Other Clinician: Referring Provider: [name] Treating Provider/Extender: Melburn Hake, Sydne Krahl [name]: 4 Subjective Chief Complaint Information obtained from Patient Patients presents for treatment of an open diabetic ulcer right lateral ankle which she's had for about a month History of Present Illness (HPI) The following HPI elements were documented for the patient's wound: Location: right lateral ankle Quality: Patient reports No Pain. Severity: Patient states wound (s) are getting better. Duration: Patient has had the wound for > 1 months prior to seeking treatment at the wound center Context: The wound appeared gradually over time, and she thinks she might have hit it with a walker Modifying Factors: Patient is currently on renal dialysis and receives treatments 3 times weekly 61 year old patient known to our practice from several previous visits for various problems now comes back with  an ulcerated area on her right lateral malleolus which she's had for about a month and she thinks she may have hit it with her walker. No other problems and no x-ray of this has been taken. She has a past medical history of atrial fibrillation, carotid artery disease, CHF, chronic kidney disease on hemodialysis with a functioning AV fistula seen by Dr. Corene Cornea dew, diabetes mellitus with peripheral neuropathy, pancreatitis and is status post abdominal hysterectomy, AV fistula placement in June 2016, back surgery, cholecystectomy, coronary angioplasty,several surgeries for AV fistula, and dialysis catheters. She has never been a smoker. 10/17/2016 -- had a right ankle x-ray which showed no acute pathology of the right ankle. Arterial vascular calcifications are noted and there is generalized osteopenia and demineralization 11/11/16 Patient appears to be doing fairly well with regard to her right ankle wound. She has been tolerating the dressing changes with the Santyl that does seem to have loosened up the slough a bit. She  has also been tolerating the debridement's when they have been performed. She tells me that she does tend to lay on her right side and this seems to be aggravating the issue. She does not have a Prevalon boot for use at this facility. Fortunately she is not having significant pain. ===== Old notes: 61 year old patient who is known to be a diabetic and has end-stage renal disease has had several comorbidities including coronary artery disease, hypertension, hyperlipidemia, pancreatitis, anemia, previous history of hysterectomy, cholecystectomy, left-sided salivary gland excision, bilateral cataract surgery,Peritoneal dialysis catheter, Mccuiston, Zanobia S. ([id number]) hemodialysis catheter. the area on the back has also been caused by instant pressure she used to sleep on a recliner all day and has significant kyphoscoliosis. As far as the wound on her right lower extremity she's not  sure how this blister occurred but she thought it has been there for about 2 weeks. No recent blood investigations available and no recent hemoglobin A1c. 12/30/2014 -- she is an assisted living facility but I believe the nurses that have not followed instructions as she had some cream applied on her back and there was a different dressing. Last week she's had a AV fistula placed on her left forearm. 01/13/2015 -- she has had some localized infection at the port site and she's been on doxycycline for this. 02/05/2015 - he has developed a small blister on her right lower extremity. 02/10/2015 -- she has developed another small blister on her right anterior chest wall in the area where she's had tape for her dialysis access. this may just be injury caused by a tape burn. She had a dermatology opinion and they have taken a biopsy of her skin. She also had a left brachial AV fistula placed this week. ========= Objective Constitutional Well-nourished and well-hydrated in no acute distress. Vitals Time Taken: 9:16 AM, Height: 61 in, Weight: 160 lbs, BMI: 30.2, Temperature: 97.6 F, Pulse: 91 bpm, Respiratory Rate: 18 breaths/min, Blood Pressure: 157/65 mmHg. Respiratory normal breathing without difficulty. Cardiovascular 1+ dorsalis pedis/posterior tibialis pulses. Psychiatric this patient is able to make decisions and demonstrates good insight into disease process. Alert and Oriented x 3. pleasant and cooperative. Integumentary (Hair, Skin) Wound #10 status is Open. Original cause of wound was Trauma. The wound is located on the Right,Lateral Malleolus. The wound measures 0.4cm length x 0.4cm width x 0.2cm depth; 0.126cm^2 area and 0.025cm^3 volume. The wound is limited to skin breakdown. There is no tunneling or undermining noted. There is a large amount of serous drainage noted. The wound margin is flat and intact. There is no granulation within the wound bed. There is a large (67-100%)  amount of necrotic tissue within the wound Perkey, Gwendolyne S. ([id number]) bed including Eschar and Adherent Slough. The periwound skin appearance exhibited: Erythema. The periwound skin appearance did not exhibit: Callus, Crepitus, Excoriation, Induration, Rash, Scarring, Dry/Scaly, Maceration, Atrophie Blanche, Cyanosis, Ecchymosis, Hemosiderin Staining, Mottled, Pallor, Rubor. The surrounding wound skin color is noted with erythema which is circumferential. Periwound temperature was noted as No Abnormality. The periwound has tenderness on palpation. Assessment Active Problems ICD-10 E11.621 - Type 2 diabetes mellitus with foot ulcer N18.6 - End stage renal disease Z99.2 - Dependence on renal dialysis L97.312 - Non-pressure chronic ulcer of right ankle with fat layer exposed Procedures Wound #10 Wound #10 is a Trauma, Other located on the Right,Lateral Malleolus . There was a Skin/Subcutaneous Tissue Debridement ([id number]) debridement with total area of 0.16 sq cm performed by STONE III, Ophelia Sipe E., PA-C.  with the following instrument(s): Curette to remove Viable and Non-Viable tissue/material including Fibrin/Slough and Subcutaneous after achieving pain control using Lidocaine 4% Topical Solution. A time out was conducted at 09:42, prior to the start of the procedure. A Minimum amount of bleeding was controlled with Pressure. The procedure was tolerated well with a pain level of 0 throughout and a pain level of 0 following the procedure. Post Debridement Measurements: 0.4cm length x 0.4cm width x 0.3cm depth; 0.038cm^3 volume. Character of Wound/Ulcer Post Debridement is improved. Severity of Tissue Post Debridement is: Fat layer exposed. Post procedure Diagnosis Wound #10: Same as Pre-Procedure Plan Wound Cleansing: Wound #10 Right,Lateral Malleolus: Clean wound with Normal Saline. ROANNE, HAYE ([id number]) May Shower, gently pat wound dry prior to applying new  dressing. Anesthetic: Wound #10 Right,Lateral Malleolus: Topical Lidocaine 4% cream applied to wound bed prior to debridement Primary Wound Dressing: Wound #10 Right,Lateral Malleolus: Iodosorb Ointment Secondary Dressing: Wound #10 Right,Lateral Malleolus: Dry Gauze Boardered Foam Dressing Dressing Change Frequency: Wound #10 Right,Lateral Malleolus: Change dressing every other day. Follow-up Appointments: Wound #10 Right,Lateral Malleolus: Return Appointment in 1 week. Edema Control: Wound #10 Right,Lateral Malleolus: Patient to wear own compression stockings Off-Loading: Wound #10 Right,Lateral Malleolus: Other: - Please provide prevalon boot for patient to wear at night while in bed (bunny boot) I'm going to discontinue the Santyl at this point. We will initiate Iodosorb ointment to be applied to the wound bed every other day followed by covering with a Boarder Foam Dressing. Hopefully this will make an overall improvement in patients wound bed. Right now there is fascia exposed but we do not have a good granulation base. If anything worsens in the interim patient will contact our office for additional recommendations. Otherwise we will see her for follow-up in one week. Electronic Signature(s) Signed: 11/11/2016 5:05:38 PM By: Worthy Keeler PA-C Entered By: Worthy Keeler on 11/11/2016 09:57:54 Melander, [name] ([id number]) -------------------------------------------------------------------------------- SuperBill Details [name]. Date of Service: 11/11/2016 Medical Record Number: [id number] Patient Account Number: [id number] Date of Birth/Sex: 09-Feb-1956 (61 y.o. Female) Treating RN: Montey Hora Primary Care Provider: [name] Other Clinician: Referring Provider: [name] Treating Provider/Extender: Melburn Hake, Mattison Golay [name]: 4 Diagnosis Coding ICD-10 Codes Code Description E11.621 Type 2 diabetes mellitus with foot  ulcer N18.6 End stage renal disease Z99.2 Dependence on renal dialysis L97.312 Non-pressure chronic ulcer of right ankle with fat layer exposed Facility Procedures CPT4 Code Description: [id number] 11042 - DEB SUBQ TISSUE 20 SQ CM/< ICD-10 Description Diagnosis L97.312 Non-pressure chronic ulcer of right ankle with fat Modifier: layer expose Quantity: 1 d Physician Procedures CPT4 Code Description: [id number] - WC PHYS SUBQ TISS 20 SQ CM ICD-10 Description Diagnosis L97.312 Non-pressure chronic ulcer of right ankle with fat Modifier: layer expose Quantity: 1 d Electronic Signature(s) Signed: 11/11/2016 5:05:38 PM By: Worthy Keeler PA-C Entered By: Worthy Keeler on 11/11/2016 09:58:14

## 2016-11-18 ENCOUNTER — Encounter: Payer: Medicare Other | Attending: Surgery | Admitting: Surgery

## 2016-11-18 DIAGNOSIS — D649 Anemia, unspecified: Secondary | ICD-10-CM | POA: Diagnosis not present

## 2016-11-18 DIAGNOSIS — I4891 Unspecified atrial fibrillation: Secondary | ICD-10-CM | POA: Insufficient documentation

## 2016-11-18 DIAGNOSIS — E1122 Type 2 diabetes mellitus with diabetic chronic kidney disease: Secondary | ICD-10-CM | POA: Diagnosis not present

## 2016-11-18 DIAGNOSIS — I2510 Atherosclerotic heart disease of native coronary artery without angina pectoris: Secondary | ICD-10-CM | POA: Diagnosis not present

## 2016-11-18 DIAGNOSIS — E785 Hyperlipidemia, unspecified: Secondary | ICD-10-CM | POA: Insufficient documentation

## 2016-11-18 DIAGNOSIS — Z992 Dependence on renal dialysis: Secondary | ICD-10-CM | POA: Insufficient documentation

## 2016-11-18 DIAGNOSIS — E11621 Type 2 diabetes mellitus with foot ulcer: Secondary | ICD-10-CM | POA: Diagnosis present

## 2016-11-18 DIAGNOSIS — I132 Hypertensive heart and chronic kidney disease with heart failure and with stage 5 chronic kidney disease, or end stage renal disease: Secondary | ICD-10-CM | POA: Insufficient documentation

## 2016-11-18 DIAGNOSIS — L97312 Non-pressure chronic ulcer of right ankle with fat layer exposed: Secondary | ICD-10-CM | POA: Insufficient documentation

## 2016-11-18 DIAGNOSIS — E1142 Type 2 diabetes mellitus with diabetic polyneuropathy: Secondary | ICD-10-CM | POA: Insufficient documentation

## 2016-11-18 DIAGNOSIS — N186 End stage renal disease: Secondary | ICD-10-CM | POA: Diagnosis not present

## 2016-11-18 DIAGNOSIS — I509 Heart failure, unspecified: Secondary | ICD-10-CM | POA: Insufficient documentation

## 2016-11-20 NOTE — Progress Notes (Signed)
[name], GWIN ([id number]) Visit Report for 11/18/2016 Chief Complaint Document Details [name], [name]. Date of Service: 11/18/2016 9:15 AM Medical Record Number: [id number] Patient Account Number: [id number] Date of Birth/Sex: 07/30/1955 (61 y.o. Female) Treating RN: Montey Hora Primary Care Provider: [name] Other Clinician: Referring Provider: [name] Treating Provider/Extender: Frann Rider in Treatment: 5 Information Obtained from: Patient Chief Complaint Patients presents for treatment of an open diabetic ulcer right lateral ankle which she's had for about a month Electronic Signature(s) Signed: 11/18/2016 9:47:43 AM By: [name], [name] Entered By: [name] on 11/18/2016 09:47:43 Briley, Cammy S. ([id number]) -------------------------------------------------------------------------------- HPI Details [name]. Date of Service: 11/18/2016 9:15 AM Medical Record Number: [id number] Patient Account Number: [id number] Date of Birth/Sex: [birth year] (61 y.o. Female) Treating RN: Montey Hora Primary Care Provider: [name] Other Clinician: Referring Provider: [name] Treating Provider/Extender: Frann Rider in Treatment: 5 History of Present Illness Location: right lateral ankle Quality: Patient reports No Pain. Severity: Patient states wound (s) are getting better. Duration: Patient has had the wound for > 1 months prior to seeking treatment at the wound center Context: The wound appeared gradually over time, and she thinks she might have hit it with a walker Modifying Factors: Patient is currently on renal dialysis and receives treatments 3 times weekly HPI Description: 61 year old patient known to our practice from several previous visits for various problems now comes back with an ulcerated area on her right lateral malleolus which she's had for about a month and she thinks she may have hit it with  her walker. No other problems and no x-ray of this has been taken. She has a past medical history of atrial fibrillation, carotid artery disease, CHF, chronic kidney disease on hemodialysis with a functioning AV fistula seen by Dr. Corene Cornea dew, diabetes mellitus with peripheral neuropathy, pancreatitis and is status post abdominal hysterectomy, AV fistula placement in June 2016, back surgery, cholecystectomy, coronary angioplasty,several surgeries for AV fistula, and dialysis catheters. She has never been a smoker. 10/17/2016 -- had a right ankle x-ray which showed no acute pathology of the right ankle. Arterial vascular calcifications are noted and there is generalized osteopenia and demineralization 11/11/16 Patient appears to be doing fairly well with regard to her right ankle wound. She has been tolerating the dressing changes with the Santyl that does seem to have loosened up the slough a bit. She has also been tolerating the debridement's when they have been performed. She tells me that she does tend to lay on her right side and this seems to be aggravating the issue. She does not have a Prevalon boot for use at this facility. Fortunately she is not having significant pain. ===== Old notes: 61 year old patient who is known to be a diabetic and has end-stage renal disease has had several comorbidities including coronary artery disease, hypertension, hyperlipidemia, pancreatitis, anemia, previous history of hysterectomy, cholecystectomy, left-sided salivary gland excision, bilateral cataract surgery,Peritoneal dialysis catheter, hemodialysis catheter. the area on the back has also been caused by instant pressure she used to sleep on a recliner all day and has significant kyphoscoliosis. As far as the wound on her right lower extremity she's not sure how this blister occurred but she thought it has been there for about 2 weeks. No recent blood investigations available and no recent hemoglobin  A1c. 12/30/2014 -- she is an assisted living facility but I believe the nurses that have not followed instructions as she had some cream  applied on her back and there was a different dressing. Last week she's had a AV Wiederholt, Bridgett S. ([id number]) fistula placed on her left forearm. 01/13/2015 -- she has had some localized infection at the port site and she's been on doxycycline for this. 02/05/2015 - he has developed a small blister on her right lower extremity. 02/10/2015 -- she has developed another small blister on her right anterior chest wall in the area where she's had tape for her dialysis access. this may just be injury caused by a tape burn. She had a dermatology opinion and they have taken a biopsy of her skin. She also had a left brachial AV fistula placed this week. ========= Electronic Signature(s) Signed: 11/18/2016 9:47:54 AM By: [name], [name] Entered By: [name] on 11/18/2016 09:47:53 [place], [name] ([id number]) -------------------------------------------------------------------------------- Physical Exam Details [name], 49. Date of Service: 11/18/2016 9:15 AM Medical Record Number: [id number] Patient Account Number: [id number] Date of Birth/Sex: 1956-05-11 (61 y.o. Female) Treating RN: Montey Hora Primary Care Provider: [name] Other Clinician: Referring Provider: [name] Treating Provider/Extender: Frann Rider in Treatment: 5 Constitutional . Pulse regular. Respirations normal and unlabored. Afebrile. . Eyes Nonicteric. Reactive to light. Ears, Nose, Mouth, and Throat Lips, teeth, and gums WNL.Marland Kitchen Moist mucosa without lesions. Neck supple and nontender. No palpable supraclavicular or cervical adenopathy. Normal sized without goiter. Respiratory WNL. No retractions.. Breath sounds WNL, No rubs, rales, rhonchi, or wheeze.. Cardiovascular Heart rhythm and rate regular, no murmur or gallop.. Pedal Pulses WNL. No  clubbing, cyanosis or edema. Lymphatic No adneopathy. No adenopathy. No adenopathy. Musculoskeletal Adexa without tenderness or enlargement.. Digits and nails w/o clubbing, cyanosis, infection, petechiae, ischemia, or inflammatory conditions.. Integumentary (Hair, Skin) No suspicious lesions. No crepitus or fluctuance. No peri-wound warmth or erythema. No masses.Marland Kitchen Psychiatric Judgement and insight Intact.. No evidence of depression, anxiety, or agitation.. Notes the wound is macerated at the edges and there is a touch of fungal infection surrounding the main wound. The base of the wound is down to the fascia covering the bone and there is debris which will benefit from Santyl ointment. No sharp debridement was required today. Electronic Signature(s) Signed: 11/18/2016 9:48:31 AM By: [name], [name] Entered By: [name] on 11/18/2016 09:48:30 [place], [name] ([id number]) -------------------------------------------------------------------------------- Physician Orders Details [name]. Date of Service: 11/18/2016 9:15 AM Medical Record Number: [id number] Patient Account Number: [id number] Date of Birth/Sex: [birth year] (61 y.o. Female) Treating RN: Montey Hora Primary Care Provider: [name] Other Clinician: Referring Provider: [name] Treating Provider/Extender: Frann Rider in Treatment: 5 Verbal / Phone Orders: No Diagnosis Coding Wound Cleansing Wound #10 Right,Lateral Malleolus o Clean wound with Normal Saline. o May Shower, gently pat wound dry prior to applying new dressing. Anesthetic Wound #10 Right,Lateral Malleolus o Topical Lidocaine 4% cream applied to wound bed prior to debridement Skin Barriers/Peri-Wound Care Wound #10 Right,Lateral Malleolus o Antifungal powder-Nystatin Primary Wound Dressing Wound #10 Right,Lateral Malleolus o Santyl Ointment Secondary Dressing Wound #10 Right,Lateral Malleolus o  Dry Gauze o Boardered Foam Dressing Dressing Change Frequency Wound #10 Right,Lateral Malleolus o Change dressing every other day. Follow-up Appointments Wound #10 Right,Lateral Malleolus o Return Appointment in 1 week. Edema Control Wound #10 Right,Lateral Malleolus o Patient to wear own compression stockings Dennen, Iyanla S. ([id number]) Off-Loading Wound #10 Right,Lateral Malleolus o Other: - Please provide prevalon boot for patient to wear at night while in bed (bunny boot) Electronic Signature(s) Signed: 11/18/2016 10:28:42 AM  By: [name], [name] Signed: 11/18/2016 4:28:06 PM By: Montey Hora Entered By: Montey Hora on 11/18/2016 09:42:36 Laurich, [name] ([id number]) -------------------------------------------------------------------------------- Problem List Details Patient Name: BRIDGITT, RAGGIO. Date of Service: 11/18/2016 9:15 AM Medical Record Number: [id number] Patient Account Number: [id number] Date of Birth/Sex: 1955-10-20 (61 y.o. Female) Treating RN: Montey Hora Primary Care Provider: [name] Other Clinician: Referring Provider: [name] Treating Provider/Extender: Frann Rider in Treatment: 5 Active Problems ICD-10 Encounter Code Description Active Date Diagnosis E11.621 Type 2 diabetes mellitus with foot ulcer 10/10/2016 Yes N18.6 End stage renal disease 10/10/2016 Yes Z99.2 Dependence on renal dialysis 10/10/2016 Yes L97.312 Non-pressure chronic ulcer of right ankle with fat layer 10/10/2016 Yes exposed Inactive Problems Resolved Problems Electronic Signature(s) Signed: 11/18/2016 9:47:31 AM By: [name], [name] Entered By: [name] on 11/18/2016 09:47:31 [place], [name] ([id number]) -------------------------------------------------------------------------------- Progress Note Details Patient Name: Weir, Sherrell S. Date of Service: 11/18/2016 9:15 AM Medical Record Number: [id number] Patient Account Number:  [id number] Date of Birth/Sex: 04/25/1956 (60 y.o. Female) Treating RN: Montey Hora Primary Care Provider: [name] Other Clinician: Referring Provider: [name] Treating Provider/Extender: Frann Rider in Treatment: 5 Subjective Chief Complaint Information obtained from Patient Patients presents for treatment of an open diabetic ulcer right lateral ankle which she's had for about a month History of Present Illness (HPI) The following HPI elements were documented for the patient's wound: Location: right lateral ankle Quality: Patient reports No Pain. Severity: Patient states wound (s) are getting better. Duration: Patient has had the wound for > 1 months prior to seeking treatment at the wound center Context: The wound appeared gradually over time, and she thinks she might have hit it with a walker Modifying Factors: Patient is currently on renal dialysis and receives treatments 3 times weekly 61 year old patient known to our practice from several previous visits for various problems now comes back with an ulcerated area on her right lateral malleolus which she's had for about a month and she thinks she may have hit it with her walker. No other problems and no x-ray of this has been taken. She has a past medical history of atrial fibrillation, carotid artery disease, CHF, chronic kidney disease on hemodialysis with a functioning AV fistula seen by Dr. Corene Cornea dew, diabetes mellitus with peripheral neuropathy, pancreatitis and is status post abdominal hysterectomy, AV fistula placement in June 2016, back surgery, cholecystectomy, coronary angioplasty,several surgeries for AV fistula, and dialysis catheters. She has never been a smoker. 10/17/2016 -- had a right ankle x-ray which showed no acute pathology of the right ankle. Arterial vascular calcifications are noted and there is generalized osteopenia and demineralization 11/11/16 Patient appears to be doing fairly well  with regard to her right ankle wound. She has been tolerating the dressing changes with the Santyl that does seem to have loosened up the slough a bit. She has also been tolerating the debridement's when they have been performed. She tells me that she does tend to lay on her right side and this seems to be aggravating the issue. She does not have a Prevalon boot for use at this facility. Fortunately she is not having significant pain. ===== Old notes: 61 year old patient who is known to be a diabetic and has end-stage renal disease has had several comorbidities including coronary artery disease, hypertension, hyperlipidemia, pancreatitis, anemia, previous history of hysterectomy, cholecystectomy, left-sided salivary gland excision, bilateral cataract surgery,Peritoneal dialysis catheter, Wixted, Dorsie S. ([id number]) hemodialysis catheter. the area on the back has  also been caused by instant pressure she used to sleep on a recliner all day and has significant kyphoscoliosis. As far as the wound on her right lower extremity she's not sure how this blister occurred but she thought it has been there for about 2 weeks. No recent blood investigations available and no recent hemoglobin A1c. 12/30/2014 -- she is an assisted living facility but I believe the nurses that have not followed instructions as she had some cream applied on her back and there was a different dressing. Last week she's had a AV fistula placed on her left forearm. 01/13/2015 -- she has had some localized infection at the port site and she's been on doxycycline for this. 02/05/2015 - he has developed a small blister on her right lower extremity. 02/10/2015 -- she has developed another small blister on her right anterior chest wall in the area where she's had tape for her dialysis access. this may just be injury caused by a tape burn. She had a dermatology opinion and they have taken a biopsy of her skin. She also had a left  brachial AV fistula placed this week. ========= Objective Constitutional Pulse regular. Respirations normal and unlabored. Afebrile. Vitals Time Taken: 9:27 AM, Height: 61 in, Weight: 160 lbs, BMI: 30.2, Temperature: 97.6 F, Pulse: 84 bpm, Respiratory Rate: 18 breaths/min, Blood Pressure: 167/69 mmHg. Eyes Nonicteric. Reactive to light. Ears, Nose, Mouth, and Throat Lips, teeth, and gums WNL.Marland Kitchen Moist mucosa without lesions. Neck supple and nontender. No palpable supraclavicular or cervical adenopathy. Normal sized without goiter. Respiratory WNL. No retractions.. Breath sounds WNL, No rubs, rales, rhonchi, or wheeze.. Cardiovascular Heart rhythm and rate regular, no murmur or gallop.. Pedal Pulses WNL. No clubbing, cyanosis or edema. Lymphatic No adneopathy. No adenopathy. No adenopathy. [name] ([id number]) Musculoskeletal Adexa without tenderness or enlargement.. Digits and nails w/o clubbing, cyanosis, infection, petechiae, ischemia, or inflammatory conditions.Marland Kitchen Psychiatric Judgement and insight Intact.. No evidence of depression, anxiety, or agitation.. General Notes: the wound is macerated at the edges and there is a touch of fungal infection surrounding the main wound. The base of the wound is down to the fascia covering the bone and there is debris which will benefit from Santyl ointment. No sharp debridement was required today. Integumentary (Hair, Skin) No suspicious lesions. No crepitus or fluctuance. No peri-wound warmth or erythema. No masses.. Wound #10 status is Open. Original cause of wound was Trauma. The wound is located on the Right,Lateral Malleolus. The wound measures 0.7cm length x 0.7cm width x 0.3cm depth; 0.385cm^2 area and 0.115cm^3 volume. The wound is limited to skin breakdown. There is no tunneling or undermining noted. There is a large amount of serous drainage noted. The wound margin is flat and intact. There is no granulation within the wound  bed. There is a large (67-100%) amount of necrotic tissue within the wound bed including Eschar and Adherent Slough. The periwound skin appearance exhibited: Erythema. The periwound skin appearance did not exhibit: Callus, Crepitus, Excoriation, Induration, Rash, Scarring, Dry/Scaly, Maceration, Atrophie Blanche, Cyanosis, Ecchymosis, Hemosiderin Staining, Mottled, Pallor, Rubor. The surrounding wound skin color is noted with erythema which is circumferential. Periwound temperature was noted as No Abnormality. The periwound has tenderness on palpation. Assessment Active Problems ICD-10 E11.621 - Type 2 diabetes mellitus with foot ulcer N18.6 - End stage renal disease Z99.2 - Dependence on renal dialysis L97.312 - Non-pressure chronic ulcer of right ankle with fat layer exposed Plan Wound Cleansing: Wound #10 Right,Lateral Malleolus: Clean wound with Normal Saline.  May Shower, gently pat wound dry prior to applying new dressing. CORNESHIA, HINES ([id number]) Anesthetic: Wound #10 Right,Lateral Malleolus: Topical Lidocaine 4% cream applied to wound bed prior to debridement Skin Barriers/Peri-Wound Care: Wound #10 Right,Lateral Malleolus: Antifungal powder-Nystatin Primary Wound Dressing: Wound #10 Right,Lateral Malleolus: Santyl Ointment Secondary Dressing: Wound #10 Right,Lateral Malleolus: Dry Gauze Boardered Foam Dressing Dressing Change Frequency: Wound #10 Right,Lateral Malleolus: Change dressing every other day. Follow-up Appointments: Wound #10 Right,Lateral Malleolus: Return Appointment in 1 week. Edema Control: Wound #10 Right,Lateral Malleolus: Patient to wear own compression stockings Off-Loading: Wound #10 Right,Lateral Malleolus: Other: - Please provide prevalon boot for patient to wear at night while in bed (bunny boot) I believe the patient still needs chemical debridement with Santyl ointment and I will use some antifungal ointment around on the moist  skin. After review have recommended: 1. Santyl ointment locally to be applied daily with a bordered foam. 2. Lotrisone cream to be applied sparingly around the main wound 3. Offloading has been discussed with her 4. adequate protein, vitamin A, vitamin C and zinc 5. Regular review is at the wound center Electronic Signature(s) Signed: 11/18/2016 9:49:30 AM By: [name], [name] Entered By: [name] on 11/18/2016 09:49:30 [place], [name] ([id number]) -------------------------------------------------------------------------------- SuperBill Details [name]. Date of Service: 11/18/2016 Medical Record Number: [id number] Patient Account Number: [id number] Date of Birth/Sex: [birth year] (61 y.o. Female) Treating RN: Montey Hora Primary Care Provider: [name] Other Clinician: Referring Provider: [name] Treating Provider/Extender: Frann Rider in Treatment: 5 Diagnosis Coding ICD-10 Codes Code Description E11.621 Type 2 diabetes mellitus with foot ulcer N18.6 End stage renal disease Z99.2 Dependence on renal dialysis L97.312 Non-pressure chronic ulcer of right ankle with fat layer exposed Facility Procedures CPT4 Code: [id number] Description: 99213 - WOUND CARE VISIT-LEV 3 EST PT Modifier: Quantity: 1 Physician Procedures CPT4 Code Description: [id number] - WC PHYS LEVEL 3 - EST PT ICD-10 Description Diagnosis E11.621 Type 2 diabetes mellitus with foot ulcer N18.6 End stage renal disease Z99.2 Dependence on renal dialysis L97.312 Non-pressure chronic ulcer of right  ankle with fat Modifier: layer expose Quantity: 1 d Electronic Signature(s) Signed: 11/18/2016 9:53:41 AM By: [name], [name] Entered By: [name] on 11/18/2016 09:53:41

## 2016-11-20 NOTE — Progress Notes (Signed)
KHIRA, CUDMORE ([id number]) Visit Report for 11/18/2016 Arrival Information Details [name], [name]. Date of Service: 11/18/2016 9:15 AM Medical Record Number: [id number] Patient Account Number: [id number] Date of Birth/Sex: 05/09/1956 (61 y.o. Female) Treating RN: Montey Hora Primary Care Daman Steffenhagen: [name] Other Clinician: Referring Shaira Sova: [name] Treating Ryen Rhames/Extender: Frann Rider in Treatment: 5 Visit Information History Since Last Visit Added or deleted any medications: No Patient Arrived: Wheel Chair Any new allergies or adverse reactions: No Arrival Time: 09:27 Had a fall or experienced change in No Accompanied By: self activities of daily living that may affect Transfer Assistance: None risk of falls: Patient Identification Verified: Yes Signs or symptoms of abuse/neglect since last No Secondary Verification Process Yes visito Completed: Hospitalized since last visit: No Patient Has Alerts: Yes Has Dressing in Place as Prescribed: Yes Patient Alerts: Patient on Blood Pain Present Now: No Thinner Electronic Signature(s) Signed: 11/18/2016 4:28:06 PM By: Montey Hora Entered By: Montey Hora on 11/18/2016 09:27:43 Garrison, Allysen Chauncey Cruel ([id number]) -------------------------------------------------------------------------------- Clinic Level of Care Assessment Details [name]. Date of Service: 11/18/2016 9:15 AM Medical Record Number: [id number] Patient Account Number: [id number] Date of Birth/Sex: 14-May-1956 (61 y.o. Female) Treating RN: Montey Hora Primary Care Miliani Deike: [name] Other Clinician: Referring Dearia Wilmouth: [name] Treating Athina Fahey/Extender: Frann Rider in Treatment: 5 Clinic Level of Care Assessment Items TOOL 4 Quantity Score []  - Use when only an EandM is performed on FOLLOW-UP visit 0 ASSESSMENTS - Nursing Assessment / Reassessment X - Reassessment of Co-morbidities  (includes updates in patient status) 1 10 X - Reassessment of Adherence to Treatment Plan 1 5 ASSESSMENTS - Wound and Skin Assessment / Reassessment X - Simple Wound Assessment / Reassessment - one wound 1 5 []  - Complex Wound Assessment / Reassessment - multiple wounds 0 []  - Dermatologic / Skin Assessment (not related to wound area) 0 ASSESSMENTS - Focused Assessment []  - Circumferential Edema Measurements - multi extremities 0 []  - Nutritional Assessment / Counseling / Intervention 0 X - Lower Extremity Assessment (monofilament, tuning fork, pulses) 1 5 []  - Peripheral Arterial Disease Assessment (using hand held doppler) 0 ASSESSMENTS - Ostomy and/or Continence Assessment and Care []  - Incontinence Assessment and Management 0 []  - Ostomy Care Assessment and Management (repouching, etc.) 0 PROCESS - Coordination of Care X - Simple Patient / Family Education for ongoing care 1 15 []  - Complex (extensive) Patient / Family Education for ongoing care 0 []  - Staff obtains Programmer, systems, Records, Test Results / Process Orders 0 []  - Staff telephones HHA, Nursing Homes / Clarify orders / etc 0 []  - Routine Transfer to another Facility (non-emergent condition) 0 Sear, Arielys S. ([id number]) []  - Routine Hospital Admission (non-emergent condition) 0 []  - New Admissions / Biomedical engineer / Ordering NPWT, Apligraf, etc. 0 []  - Emergency Hospital Admission (emergent condition) 0 X - Simple Discharge Coordination 1 10 []  - Complex (extensive) Discharge Coordination 0 PROCESS - Special Needs []  - Pediatric / Minor Patient Management 0 []  - Isolation Patient Management 0 []  - Hearing / Language / Visual special needs 0 []  - Assessment of Community assistance (transportation, D/C planning, etc.) 0 []  - Additional assistance / Altered mentation 0 []  - Support Surface(s) Assessment (bed, cushion, seat, etc.) 0 INTERVENTIONS - Wound Cleansing / Measurement X - Simple Wound Cleansing - one wound  1 5 []  - Complex Wound Cleansing - multiple wounds 0 X - Wound Imaging (photographs - any number of wounds) 1 5 []  -  Wound Tracing (instead of photographs) 0 X - Simple Wound Measurement - one wound 1 5 []  - Complex Wound Measurement - multiple wounds 0 INTERVENTIONS - Wound Dressings X - Small Wound Dressing one or multiple wounds 1 10 []  - Medium Wound Dressing one or multiple wounds 0 []  - Large Wound Dressing one or multiple wounds 0 X - Application of Medications - topical 1 5 []  - Application of Medications - injection 0 INTERVENTIONS - Miscellaneous []  - External ear exam 0 Panebianco, Kensy S. ([id number]) []  - Specimen Collection (cultures, biopsies, blood, body fluids, etc.) 0 []  - Specimen(s) / Culture(s) sent or taken to Lab for analysis 0 []  - Patient Transfer (multiple staff / Harrel Lemon Lift / Similar devices) 0 []  - Simple Staple / Suture removal (25 or less) 0 []  - Complex Staple / Suture removal (26 or more) 0 []  - Hypo / Hyperglycemic Management (close monitor of Blood Glucose) 0 []  - Ankle / Brachial Index (ABI) - do not check if billed separately 0 X - Vital Signs 1 5 Has the patient been seen at the hospital within the last three years: Yes Total Score: 85 Level Of Care: New/Established - Level 3 Electronic Signature(s) Signed: 11/18/2016 4:28:06 PM By: Montey Hora Entered By: Montey Hora on 11/18/2016 09:43:09 Holladay, [name] ([id number]) -------------------------------------------------------------------------------- Encounter Discharge Information Details Patient Name: [name], [name] Date of Service: 11/18/2016 9:15 AM Medical Record Number: [id number] Patient Account Number: [id number] Date of Birth/Sex: 24-Mar-1956 (61 y.o. Female) Treating RN: Montey Hora Primary Care Israa Caban: [name] Other Clinician: Referring Lennex Pietila: [name] Treating Kaileigh Viswanathan/Extender: Frann Rider in Treatment: 5 Encounter Discharge Information Items Discharge  Pain Level: 0 Discharge Condition: Stable Ambulatory Status: Wheelchair Discharge Destination: Nursing Home Transportation: Private Auto Accompanied By: self Schedule Follow-up Appointment: Yes Medication Reconciliation completed No and provided to Patient/Care Duc Crocket: Provided on Clinical Summary of Care: 11/18/2016 Form Type Recipient Paper Patient LD Electronic Signature(s) Signed: 11/18/2016 1:08:11 PM By: Montey Hora Previous Signature: 11/18/2016 9:52:15 AM Version By: Ruthine Dose Entered By: Montey Hora on 11/18/2016 13:08:10 Altschuler, [name] ([id number]) -------------------------------------------------------------------------------- Lower Extremity Assessment Details Patient Name: Haugan, Adaleen S. Date of Service: 11/18/2016 9:15 AM Medical Record Number: [id number] Patient Account Number: [id number] Date of Birth/Sex: 1955-12-29 (61 y.o. Female) Treating RN: Montey Hora Primary Care Blondina Coderre: [name] Other Clinician: Referring Josanna Hefel: [name] Treating Ellington Cornia/Extender: Frann Rider in Treatment: 5 Vascular Assessment Pulses: Dorsalis Pedis Palpable: [Right:Yes] Posterior Tibial Extremity colors, hair growth, and conditions: Extremity Color: [Right:Normal] Hair Growth on Extremity: [Right:No] Temperature of Extremity: [Right:Warm] Capillary Refill: [Right:< 3 seconds] Electronic Signature(s) Signed: 11/18/2016 4:28:06 PM By: Montey Hora Entered By: Montey Hora on 11/18/2016 09:33:44 Ing, Mylee S. ([id number]) -------------------------------------------------------------------------------- Multi Wound Chart Details Patient Name: YHCWC, Meshawn S. Date of Service: 11/18/2016 9:15 AM Medical Record Number: [id number] Patient Account Number: [id number] Date of Birth/Sex: 07-13-1956 (61 y.o. Female) Treating RN: Montey Hora Primary Care Decie Verne: [name] Other Clinician: Referring Shlomie Romig: [name] Treating  Devaunte Gasparini/Extender: Frann Rider in Treatment: 5 Vital Signs Height(in): 61 Pulse(bpm): 84 Weight(lbs): 160 Blood Pressure 167/69 (mmHg): Body Mass Index(BMI): 30 Temperature(F): 97.6 Respiratory Rate 18 (breaths/min): Photos: [N/A:N/A] Wound Location: Right Malleolus - Lateral N/A N/A Wounding Event: Trauma N/A N/A Primary Etiology: Trauma, Other N/A N/A Comorbid History: Glaucoma, Anemia, N/A N/A Coronary Artery Disease, Hypertension, Type II Diabetes, End Stage Renal Disease, Osteoarthritis, Neuropathy Date Acquired: 09/12/2016 N/A N/A Weeks of Treatment: 5 N/A N/A Wound Status: Open N/A N/A Measurements  L x W x D 0.7x0.7x0.3 N/A N/A (cm) Area (cm) : 0.385 N/A N/A Volume (cm) : 0.115 N/A N/A % Reduction in Area: -36.00% N/A N/A % Reduction in Volume: -101.80% N/A N/A Classification: Full Thickness Without N/A N/A Exposed Support Structures HBO Classification: Grade 1 N/A N/A Exudate Amount: Large N/A N/A Hartog, Andreah Chauncey Cruel ([id number]) Exudate Type: Serous N/A N/A Exudate Color: amber N/A N/A Wound Margin: Flat and Intact N/A N/A Granulation Amount: None Present (0%) N/A N/A Necrotic Amount: Large (67-100%) N/A N/A Necrotic Tissue: Eschar, Adherent Slough N/A N/A Exposed Structures: Fascia: No N/A N/A Fat Layer (Subcutaneous Tissue) Exposed: No Tendon: No Muscle: No Joint: No Bone: No Limited to Skin Breakdown Epithelialization: None N/A N/A Periwound Skin Texture: Excoriation: No N/A N/A Induration: No Callus: No Crepitus: No Rash: No Scarring: No Periwound Skin Maceration: No N/A N/A Moisture: Dry/Scaly: No Periwound Skin Color: Erythema: Yes N/A N/A Atrophie Blanche: No Cyanosis: No Ecchymosis: No Hemosiderin Staining: No Mottled: No Pallor: No Rubor: No Erythema Location: Circumferential N/A N/A Temperature: No Abnormality N/A N/A Tenderness on Yes N/A N/A Palpation: Wound Preparation: Ulcer Cleansing: N/A  N/A Rinsed/Irrigated with Saline Topical Anesthetic Applied: Other: lidocaine 4% Treatment Notes Electronic Signature(s) Signed: 11/18/2016 9:47:37 AM By: [name], [name] Entered By: [name] on 11/18/2016 09:47:37 Pecina, [name] ([id number]) Kane, [name] ([id number]) -------------------------------------------------------------------------------- [place] Details Patient Name: ZEMA, LIZARDO. Date of Service: 11/18/2016 9:15 AM Medical Record Number: [id number] Patient Account Number: [id number] Date of Birth/Sex: 1956/06/19 (61 y.o. Female) Treating RN: Montey Hora Primary Care Terri Malerba: [name] Other Clinician: Referring Joeli Fenner: [name] Treating Nikita Humble/Extender: Frann Rider in Treatment: 5 Active Inactive ` Abuse / Safety / Falls / Self Care Management Nursing Diagnoses: Potential for falls Goals: Patient will remain injury free Date Initiated: 10/10/2016 Target Resolution Date: 12/23/2016 Goal Status: Active Interventions: Assess fall risk on admission and as needed Notes: ` Nutrition Nursing Diagnoses: Potential for alteratiion in Nutrition/Potential for imbalanced nutrition Goals: Patient/caregiver agrees to and verbalizes understanding of need to use nutritional supplements and/or vitamins as prescribed Date Initiated: 10/10/2016 Target Resolution Date: 10/21/2016 Goal Status: Active Interventions: Assess patient nutrition upon admission and as needed per policy Notes: ` Orientation to the Wound Care Program Nursing Diagnoses: Knowledge deficit related to the wound healing center program Blanchette, LILYMARIE SCROGGINS ([id number]) Goals: Patient/caregiver will verbalize understanding of the [facility] Date Initiated: 10/10/2016 Target Resolution Date: 12/23/2016 Goal Status: Active Interventions: Provide education on orientation to the wound center Notes: ` Wound/Skin Impairment Nursing  Diagnoses: Impaired tissue integrity Goals: Patient/caregiver will verbalize understanding of skin care regimen Date Initiated: 10/10/2016 Target Resolution Date: 12/23/2016 Goal Status: Active Ulcer/skin breakdown will have a volume reduction of 30% by week 4 Date Initiated: 10/10/2016 Target Resolution Date: 12/23/2016 Goal Status: Active Ulcer/skin breakdown will have a volume reduction of 50% by week 8 Date Initiated: 10/10/2016 Target Resolution Date: 12/23/2016 Goal Status: Active Ulcer/skin breakdown will have a volume reduction of 80% by week 12 Date Initiated: 10/10/2016 Target Resolution Date: 12/23/2016 Goal Status: Active Ulcer/skin breakdown will heal within 14 weeks Date Initiated: 10/10/2016 Target Resolution Date: 12/23/2016 Goal Status: Active Interventions: Assess patient/caregiver ability to obtain necessary supplies Assess patient/caregiver ability to perform ulcer/skin care regimen upon admission and as needed Assess ulceration(s) every visit Notes: Electronic Signature(s) Signed: 11/18/2016 4:28:06 PM By: Montey Hora Entered By: Montey Hora on 11/18/2016 09:33:51 Molock, [name] ([id number]) -------------------------------------------------------------------------------- Pain Assessment Details [name].  Date of Service: 11/18/2016 9:15 AM Medical Record Number: [id number] Patient Account Number: [id number] Date of Birth/Sex: April 11, 1956 (61 y.o. Female) Treating RN: Montey Hora Primary Care Rosalynn Sergent: [name] Other Clinician: Referring Bridgett Hattabaugh: [name] Treating Randal Goens/Extender: Frann Rider in Treatment: 5 Active Problems Location of Pain Severity and Description of Pain Patient Has Paino No Site Locations Pain Management and Medication Current Pain Management: Notes Topical or injectable lidocaine is offered to patient for acute pain when surgical debridement is performed. If needed, Patient is instructed to use  over the counter pain medication for the following 24-48 hours after debridement. Wound care MDs do not prescribed pain medications. Patient has chronic pain or uncontrolled pain. Patient has been instructed to make an appointment with their Primary Care Physician for pain management. Electronic Signature(s) Signed: 11/18/2016 4:28:06 PM By: Montey Hora Entered By: Montey Hora on 11/18/2016 09:27:52 Knouff, [name] ([id number]) -------------------------------------------------------------------------------- Patient/Caregiver Education Details [name]. Date of Service: 11/18/2016 9:15 AM Medical Record Number: [id number] Patient Account Number: [id number] Date of Birth/Gender: [birth year] (61 y.o. Female) Treating RN: Montey Hora Primary Care Physician: [name] Other Clinician: Referring Physician: [name] Treating Physician/Extender: Frann Rider in Treatment: 5 Education Assessment Education Provided To: Patient Education Topics Provided Wound/Skin Impairment: Handouts: Other: wound care as ordered Methods: Explain/Verbal Responses: State content correctly Electronic Signature(s) Signed: 11/18/2016 4:28:06 PM By: Montey Hora Entered By: Montey Hora on 11/18/2016 13:08:31 Frey, [name] ([id number]) -------------------------------------------------------------------------------- Wound Assessment Details Patient Name: [name], Tayler S. Date of Service: 11/18/2016 9:15 AM Medical Record Number: [id number] Patient Account Number: [id number] Date of Birth/Sex: 08-Apr-1956 (61 y.o. Female) Treating RN: Montey Hora Primary Care Roanna Reaves: [name] Other Clinician: Referring Lareina Espino: [name] Treating Wateen Varon/Extender: Frann Rider in Treatment: 5 Wound Status Wound Number: 10 Primary Trauma, Other Etiology: Wound Location: Right Malleolus - Lateral Wound Open Wounding Event: Trauma Status: Date Acquired:  09/12/2016 Comorbid Glaucoma, Anemia, Coronary Artery Weeks Of Treatment: 5 History: Disease, Hypertension, Type II Clustered Wound: No Diabetes, End Stage Renal Disease, Osteoarthritis, Neuropathy Photos Photo Uploaded By: Montey Hora on 11/18/2016 09:40:50 Wound Measurements Length: (cm) 0.7 Width: (cm) 0.7 Depth: (cm) 0.3 Area: (cm) 0.385 Volume: (cm) 0.115 % Reduction in Area: -36% % Reduction in Volume: -101.8% Epithelialization: None Tunneling: No Undermining: No Wound Description Full Thickness Without Classification: Exposed Support Structures Diabetic Severity Grade 1 (Wagner): Wound Margin: Flat and Intact Exudate Amount: Large Exudate Type: Serous Exudate Color: amber Foul Odor After Cleansing: No Slough/Fibrino Yes Wound Bed Myszka, Jazalynn S. ([id number]) Granulation Amount: None Present (0%) Exposed Structure Necrotic Amount: Large (67-100%) Fascia Exposed: No Necrotic Quality: Eschar, Adherent Slough Fat Layer (Subcutaneous Tissue) Exposed: No Tendon Exposed: No Muscle Exposed: No Joint Exposed: No Bone Exposed: No Limited to Skin Breakdown Periwound Skin Texture Texture Color No Abnormalities Noted: No No Abnormalities Noted: No Callus: No Atrophie Blanche: No Crepitus: No Cyanosis: No Excoriation: No Ecchymosis: No Induration: No Erythema: Yes Rash: No Erythema Location: Circumferential Scarring: No Hemosiderin Staining: No Mottled: No Moisture Pallor: No No Abnormalities Noted: No Rubor: No Dry / Scaly: No Maceration: No Temperature / Pain Temperature: No Abnormality Tenderness on Palpation: Yes Wound Preparation Ulcer Cleansing: Rinsed/Irrigated with Saline Topical Anesthetic Applied: Other: lidocaine 4%, Treatment Notes Wound #10 (Right, Lateral Malleolus) 1. Cleansed with: Clean wound with Normal Saline 2. Anesthetic Topical Lidocaine 4% cream to wound bed prior to debridement 3. Peri-wound Care: Antifungal  cream 4. Dressing Applied: Santyl Ointment 5. Secondary Dressing Applied Bordered  Foam Dressing Dry Gauze Electronic Signature(s) Signed: 11/18/2016 4:28:06 PM By: Montey Hora Entered By: Montey Hora on 11/18/2016 09:33:17 Shin, [name] ([id number]) Datta, [name] ([id number]) -------------------------------------------------------------------------------- Vitals Details Patient Name: WAQLR, 62. Date of Service: 11/18/2016 9:15 AM Medical Record Number: [id number] Patient Account Number: [id number] Date of Birth/Sex: 26-Sep-1955 (61 y.o. Female) Treating RN: Montey Hora Primary Care Annamary Buschman: [name] Other Clinician: Referring Latori Beggs: [name] Treating Rahsaan Weakland/Extender: Frann Rider in Treatment: 5 Vital Signs Time Taken: 09:27 Temperature (F): 97.6 Height (in): 61 Pulse (bpm): 84 Weight (lbs): 160 Respiratory Rate (breaths/min): 18 Body Mass Index (BMI): 30.2 Blood Pressure (mmHg): 167/69 Reference Range: 80 - 120 mg / dl Electronic Signature(s) Signed: 11/18/2016 4:28:06 PM By: Montey Hora Entered By: Montey Hora on 11/18/2016 09:29:03

## 2016-11-25 ENCOUNTER — Encounter: Payer: Medicare Other | Admitting: Surgery

## 2016-11-25 DIAGNOSIS — E11621 Type 2 diabetes mellitus with foot ulcer: Secondary | ICD-10-CM | POA: Diagnosis not present

## 2016-11-27 NOTE — Progress Notes (Signed)
[name], [name] ([id number]) Visit Report for 11/25/2016 Chief Complaint Document Details [name], [name]. Date of Service: 11/25/2016 3:30 PM Medical Record Number: [id number] Patient Account Number: [id number] Date of Birth/Sex: 06-02-1956 (61 y.o. Female) Treating RN: Cornell Barman Primary Care Provider: [name] Other Clinician: Referring Provider: [name] Treating Provider/Extender: Frann Rider in Treatment: 6 Information Obtained from: Patient Chief Complaint Patients presents for treatment of an open diabetic ulcer right lateral ankle which she's had for about a month Electronic Signature(s) Signed: 11/25/2016 3:39:16 PM By: [name], [name] Entered By: [name] on 11/25/2016 15:39:16 [name], [name] ([id number]) -------------------------------------------------------------------------------- Debridement Details [name]. Date of Service: 11/25/2016 3:30 PM Medical Record Number: [id number] Patient Account Number: [id number] Date of Birth/Sex: [birth year] (61 y.o. Female) Treating RN: Cornell Barman Primary Care Provider: [name] Other Clinician: Referring Provider: [name] Treating Provider/Extender: Frann Rider in Treatment: 6 Debridement Performed for Wound #10 Right,Lateral Malleolus Assessment: Performed By: Physician [name], MD Debridement: Debridement Pre-procedure Yes - 15:28 Verification/Time Out Taken: Start Time: 15:29 Pain Control: Other : lidocaine 4% Level: Skin/Subcutaneous Tissue Total Area Debrided (L x 0.6 (cm) x 0.9 (cm) = 0.54 (cm) W): Tissue and other Viable, Non-Viable, Fibrin/Slough, Subcutaneous material debrided: Instrument: Curette Bleeding: Minimum Hemostasis Achieved: Pressure End Time: 15:29 Procedural Pain: 3 Post Procedural Pain: 3 Response to Treatment: Procedure was tolerated well Post Debridement Measurements of Total Wound Length: (cm) 0.6 Width:  (cm) 0.5 Depth: (cm) 0.3 Volume: (cm) 0.071 Character of Wound/Ulcer Post Requires Further Debridement Debridement: Severity of Tissue Post Debridement: Fat layer exposed Post Procedure Diagnosis Same as Pre-procedure Electronic Signature(s) Signed: 11/25/2016 3:39:07 PM By: [name], [name] Signed: 11/25/2016 4:30:32 PM By: Gretta Cool RN, [name], Kim RN, [name] Entered By: [name] on 11/25/2016 15:39:06 Mcadam, [name] ([id number]) Ober, [name] ([id number]) -------------------------------------------------------------------------------- HPI Details Patient Name: [name], [name]. Date of Service: 11/25/2016 3:30 PM Medical Record Number: [id number] Patient Account Number: [id number] Date of Birth/Sex: 1956/04/26 (61 y.o. Female) Treating RN: Cornell Barman Primary Care Provider: [name] Other Clinician: Referring Provider: [name] Treating Provider/Extender: Frann Rider in Treatment: 6 History of Present Illness Location: right lateral ankle Quality: Patient reports No Pain. Severity: Patient states wound (s) are getting better. Duration: Patient has had the wound for > 1 months prior to seeking treatment at the wound center Context: The wound appeared gradually over time, and she thinks she might have hit it with a walker Modifying Factors: Patient is currently on renal dialysis and receives treatments 3 times weekly HPI Description: 61 year old patient known to our practice from several previous visits for various problems now comes back with an ulcerated area on her right lateral malleolus which she's had for about a month and she thinks she may have hit it with her walker. No other problems and no x-ray of this has been taken. She has a past medical history of atrial fibrillation, carotid artery disease, CHF, chronic kidney disease on hemodialysis with a functioning AV fistula seen by Dr. Corene Cornea dew, diabetes mellitus with peripheral neuropathy, pancreatitis  and is status post abdominal hysterectomy, AV fistula placement in June 2016, back surgery, cholecystectomy, coronary angioplasty,several surgeries for AV fistula, and dialysis catheters. She has never been a smoker. 10/17/2016 -- had a right ankle x-ray which showed no acute pathology of the right ankle. Arterial vascular calcifications are noted and there is generalized osteopenia and demineralization 11/11/16 Patient appears to be doing fairly well  with regard to her right ankle wound. She has been tolerating the dressing changes with the Santyl that does seem to have loosened up the slough a bit. She has also been tolerating the debridement's when they have been performed. She tells me that she does tend to lay on her right side and this seems to be aggravating the issue. She does not have a Prevalon boot for use at this facility. Fortunately she is not having significant pain. ===== Old notes: 61 year old patient who is known to be a diabetic and has end-stage renal disease has had several comorbidities including coronary artery disease, hypertension, hyperlipidemia, pancreatitis, anemia, previous history of hysterectomy, cholecystectomy, left-sided salivary gland excision, bilateral cataract surgery,Peritoneal dialysis catheter, hemodialysis catheter. the area on the back has also been caused by instant pressure she used to sleep on a recliner all day and has significant kyphoscoliosis. As far as the wound on her right lower extremity she's not sure how this blister occurred but she thought it has been there for about 2 weeks. No recent blood investigations available and no recent hemoglobin A1c. 12/30/2014 -- she is an assisted living facility but I believe the nurses that have not followed instructions as she had some cream applied on her back and there was a different dressing. Last week she's had a AV Hofbauer, Nolia S. ([id number]) fistula placed on her left forearm. 01/13/2015 -- she  has had some localized infection at the port site and she's been on doxycycline for this. 02/05/2015 - he has developed a small blister on her right lower extremity. 02/10/2015 -- she has developed another small blister on her right anterior chest wall in the area where she's had tape for her dialysis access. this may just be injury caused by a tape burn. She had a dermatology opinion and they have taken a biopsy of her skin. She also had a left brachial AV fistula placed this week. ========= Electronic Signature(s) Signed: 11/25/2016 3:39:22 PM By: [name], [name] Entered By: [name] on 11/25/2016 15:39:22 Zentner, [name] ([id number]) -------------------------------------------------------------------------------- Physical Exam Details Patient Name: Sedlak, 8. Date of Service: 11/25/2016 3:30 PM Medical Record Number: [id number] Patient Account Number: [id number] Date of Birth/Sex: 04-11-1956 (61 y.o. Female) Treating RN: Cornell Barman Primary Care Provider: [name] Other Clinician: Referring Provider: [name] Treating Provider/Extender: Frann Rider in Treatment: 6 Constitutional . Pulse regular. Respirations normal and unlabored. Afebrile. . Eyes Nonicteric. Reactive to light. Ears, Nose, Mouth, and Throat Lips, teeth, and gums WNL.Marland Kitchen Moist mucosa without lesions. Neck supple and nontender. No palpable supraclavicular or cervical adenopathy. Normal sized without goiter. Respiratory WNL. No retractions.. Cardiovascular Pedal Pulses WNL. No clubbing, cyanosis or edema. Lymphatic No adneopathy. No adenopathy. No adenopathy. Musculoskeletal Adexa without tenderness or enlargement.. Digits and nails w/o clubbing, cyanosis, infection, petechiae, ischemia, or inflammatory conditions.. Integumentary (Hair, Skin) No suspicious lesions. No crepitus or fluctuance. No peri-wound warmth or erythema. No masses.Marland Kitchen Psychiatric Judgement and insight Intact.. No  evidence of depression, anxiety, or agitation.. Notes the edges of the wound are not macerated any longer and the base of the wound does not probe down to bone. However sharp debridement is required with a #3 curet and minimal bleeding controlled with pressure Electronic Signature(s) Signed: 11/25/2016 3:40:04 PM By: [name], [name] Entered By: [name] on 11/25/2016 15:40:03 Panico, [name] ([id number]) -------------------------------------------------------------------------------- Physician Orders Details [name]. Date of Service: 11/25/2016 3:30 PM Medical Record Number: [id number] Patient Account Number: [id number]  Date of Birth/Sex: 1956-05-18 (61 y.o. Female) Treating RN: Cornell Barman Primary Care Provider: [name] Other Clinician: Referring Provider: [name] Treating Provider/Extender: Frann Rider in Treatment: 6 Verbal / Phone Orders: Yes Clinician: Cornell Barman Read Back and Verified: Yes Diagnosis Coding Wound Cleansing Wound #10 Right,Lateral Malleolus o Clean wound with Normal Saline. o May Shower, gently pat wound dry prior to applying new dressing. Anesthetic Wound #10 Right,Lateral Malleolus o Topical Lidocaine 4% cream applied to wound bed prior to debridement Skin Barriers/Peri-Wound Care Wound #10 Right,Lateral Malleolus o Antifungal powder-Nystatin Primary Wound Dressing Wound #10 Right,Lateral Malleolus o Santyl Ointment Secondary Dressing Wound #10 Right,Lateral Malleolus o Dry Gauze o Boardered Foam Dressing Dressing Change Frequency Wound #10 Right,Lateral Malleolus o Change dressing every day. Follow-up Appointments Wound #10 Right,Lateral Malleolus o Return Appointment in 2 weeks. Edema Control Wound #10 Right,Lateral Malleolus o Patient to wear own compression stockings SHANAVIA, MAKELA ([id number]) Electronic Signature(s) Signed: 11/25/2016 3:55:17 PM By: [name],  [name] Signed: 11/25/2016 4:30:32 PM By: Gretta Cool RN, [name], Kim RN, [name] Entered By: Gretta Cool, RN, [name], Kim on 11/25/2016 15:30:35 Damewood, [name] ([id number]) -------------------------------------------------------------------------------- Problem List Details Patient Name: UNDINE, NEALIS. Date of Service: 11/25/2016 3:30 PM Medical Record Number: [id number] Patient Account Number: [id number] Date of Birth/Sex: 1955-07-26 (61 y.o. 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 Date of Service: 11/25/2016 3:30 PM Medical Record Number: [id number] Patient Account Number: [id number] Date of Birth/Sex: 24-Jul-1955 (61 y.o. Female) Treating RN: Cornell Barman Primary Care Provider: [name] Other Clinician: Referring Provider: [name] Treating Provider/Extender: Frann Rider in Treatment: 6 Subjective Chief Complaint Information obtained from Patient Patients presents for treatment of an open diabetic ulcer right lateral ankle which she's had for about a month History of Present Illness (HPI) The following HPI elements were documented for the patient's wound: Location: right lateral ankle Quality: Patient  reports No Pain. Severity: Patient states wound (s) are getting better. Duration: Patient has had the wound for > 1 months prior to seeking treatment at the wound center Context: The wound appeared gradually over time, and she thinks she might have hit it with a walker Modifying Factors: Patient is currently on renal dialysis and receives treatments 3 times weekly 61 year old patient known to our practice from several previous visits for various problems now comes back with an ulcerated area on her right lateral malleolus which she's had for about a month and she thinks she may have hit it with her walker. No other problems and no x-ray of this has been taken. She has a past medical history of atrial fibrillation, carotid artery disease, CHF, chronic kidney disease on hemodialysis with a functioning AV fistula seen by Dr. Corene Cornea dew, diabetes mellitus with peripheral neuropathy, pancreatitis and is status post abdominal hysterectomy, AV fistula placement in June 2016, back surgery, cholecystectomy, coronary angioplasty,several surgeries for AV fistula, and dialysis catheters. She has never been a  smoker. 10/17/2016 -- had a right ankle x-ray which showed no acute pathology of the right ankle. Arterial vascular calcifications are noted and there is generalized osteopenia and demineralization 11/11/16 Patient appears to be doing fairly well with regard to her right ankle wound. She has been tolerating the dressing changes with the Santyl that does seem to have loosened up the slough a bit. She has also been tolerating the debridement's when they have been performed. She tells me that she does tend to lay on her right side and this seems to be aggravating the issue. She does not have a Prevalon boot for use at this facility. Fortunately she is not having significant pain. ===== Old notes: 61 year old patient who is known to be a diabetic and has end-stage renal disease has had several comorbidities  including coronary artery disease, hypertension, hyperlipidemia, pancreatitis, anemia, previous history of hysterectomy, cholecystectomy, left-sided salivary gland excision, bilateral cataract surgery,Peritoneal dialysis catheter, Glasner, Manuela S. ([id number]) hemodialysis catheter. the area on the back has also been caused by instant pressure she used to sleep on a recliner all day and has significant kyphoscoliosis. As far as the wound on her right lower extremity she's not sure how this blister occurred but she thought it has been there for about 2 weeks. No recent blood investigations available and no recent hemoglobin A1c. 12/30/2014 -- she is an assisted living facility but I believe the nurses that have not followed instructions as she had some cream applied on her back and there was a different dressing. Last week she's had a AV fistula placed on her left forearm. 01/13/2015 -- she has had some localized infection at the port site and she's been on doxycycline for this. 02/05/2015 - he has developed a small blister on her right lower extremity. 02/10/2015 -- she has developed another small blister on her right anterior chest wall in the area where she's had tape for her dialysis access. this may just be injury caused by a tape burn. She had a dermatology opinion and they have taken a biopsy of her skin. She also had a left brachial AV fistula placed this week. ========= Objective Constitutional Pulse regular. Respirations normal and unlabored. Afebrile. Vitals Time Taken: 3:17 PM, Height: 61 in, Weight: 160 lbs, BMI: 30.2, Temperature: 98.2 F, Pulse: 63 bpm, Respiratory Rate: 16 breaths/min, Blood Pressure: 122/58 mmHg. Eyes Nonicteric. Reactive to light. Ears, Nose, Mouth, and Throat Lips, teeth, and gums WNL.Marland Kitchen Moist mucosa without lesions. Neck supple and nontender. No palpable supraclavicular or cervical adenopathy. Normal sized without goiter. Respiratory WNL. No  retractions.. Cardiovascular Pedal Pulses WNL. No clubbing, cyanosis or edema. Lymphatic No adneopathy. No adenopathy. No adenopathy. [name] ([id number]) Musculoskeletal Adexa without tenderness or enlargement.. Digits and nails w/o clubbing, cyanosis, infection, petechiae, ischemia, or inflammatory conditions.Marland Kitchen Psychiatric Judgement and insight Intact.. No evidence of depression, anxiety, or agitation.. General Notes: the edges of the wound are not macerated any longer and the base of the wound does not probe down to bone. However sharp debridement is required with a #3 curet and minimal bleeding controlled with pressure Integumentary (Hair, Skin) No suspicious lesions. No crepitus or fluctuance. No peri-wound warmth or erythema. No masses.. Wound #10 status is Open. Original cause of wound was Trauma. The wound is located on the Right,Lateral Malleolus. The wound measures 0.6cm length x 0.9cm width x 0.3cm depth; 0.424cm^2 area and 0.127cm^3 volume. There is Fat Layer (Subcutaneous Tissue) Exposed exposed. There is no tunneling noted, however, there is undermining  starting at 4:00 and ending at 9:00 with a maximum distance of 0.4cm. There is a medium amount of purulent drainage noted. The wound margin is flat and intact. There is no granulation within the wound bed. There is a large (67-100%) amount of necrotic tissue within the wound bed including Eschar and Adherent Slough. The periwound skin appearance exhibited: Erythema. The periwound skin appearance did not exhibit: Callus, Crepitus, Excoriation, Induration, Rash, Scarring, Dry/Scaly, Maceration, Atrophie Blanche, Cyanosis, Ecchymosis, Hemosiderin Staining, Mottled, Pallor, Rubor. The surrounding wound skin color is noted with erythema which is circumferential. Periwound temperature was noted as No Abnormality. The periwound has tenderness on palpation. Assessment Active Problems ICD-10 E11.621 - Type 2 diabetes mellitus  with foot ulcer N18.6 - End stage renal disease Z99.2 - Dependence on renal dialysis L97.312 - Non-pressure chronic ulcer of right ankle with fat layer exposed Procedures Wound #10 Wound #10 is a Trauma, Other located on the Right,Lateral Malleolus . There was a Skin/Subcutaneous Tissue Debridement ([id number]) debridement with total area of 0.54 sq cm performed by [name], Hasting, [name] ([id number]) MD. with the following instrument(s): Curette to remove Viable and Non-Viable tissue/material including Fibrin/Slough and Subcutaneous after achieving pain control using Other (lidocaine 4%). A time out was conducted at 15:28, prior to the start of the procedure. A Minimum amount of bleeding was controlled with Pressure. The procedure was tolerated well with a pain level of 3 throughout and a pain level of 3 following the procedure. Post Debridement Measurements: 0.6cm length x 0.5cm width x 0.3cm depth; 0.071cm^3 volume. Character of Wound/Ulcer Post Debridement requires further debridement. Severity of Tissue Post Debridement is: Fat layer exposed. Post procedure Diagnosis Wound #10: Same as Pre-Procedure Plan Wound Cleansing: Wound #10 Right,Lateral Malleolus: Clean wound with Normal Saline. May Shower, gently pat wound dry prior to applying new dressing. Anesthetic: Wound #10 Right,Lateral Malleolus: Topical Lidocaine 4% cream applied to wound bed prior to debridement Skin Barriers/Peri-Wound Care: Wound #10 Right,Lateral Malleolus: Antifungal powder-Nystatin Primary Wound Dressing: Wound #10 Right,Lateral Malleolus: Santyl Ointment Secondary Dressing: Wound #10 Right,Lateral Malleolus: Dry Gauze Boardered Foam Dressing Dressing Change Frequency: Wound #10 Right,Lateral Malleolus: Change dressing every day. Follow-up Appointments: Wound #10 Right,Lateral Malleolus: Return Appointment in 2 weeks. Edema Control: Wound #10 Right,Lateral Malleolus: Patient to wear own  compression stockings Siegman, Naydeen S. ([id number]) after sharp debridement today, I have recommended: 1. Santyl ointment locally to be applied daily with a bordered foam. 2. Lotrisone cream to be applied sparingly around the main wound 3. Offloading has been discussed with her 4. adequate protein, vitamin A, vitamin C and zinc 5. Regular review is at the wound center Electronic Signature(s) Signed: 11/25/2016 3:41:07 PM By: [name], [name] Entered By: [name] on 11/25/2016 15:41:06 Radabaugh, [name] ([id number]) -------------------------------------------------------------------------------- SuperBill Details [name]. Date of Service: 11/25/2016 Medical Record Number: [id number] Patient Account Number: [id number] Date of Birth/Sex: 1955-10-01 (61 y.o. Female) Treating RN: Cornell Barman Primary Care Provider: [name] Other Clinician: Referring Provider: [name] Treating Provider/Extender: Frann Rider in Treatment: 6 Diagnosis Coding ICD-10 Codes Code Description E11.621 Type 2 diabetes mellitus with foot ulcer N18.6 End stage renal disease Z99.2 Dependence on renal dialysis L97.312 Non-pressure chronic ulcer of right ankle with fat layer exposed Facility Procedures CPT4 Code Description: [id number] 11042 - DEB SUBQ TISSUE 20 SQ CM/< ICD-10 Description Diagnosis E11.621 Type 2 diabetes mellitus with foot ulcer L97.312 Non-pressure chronic ulcer of right ankle with fat Modifier: layer expose Quantity: 1 d  Physician Procedures CPT4 Code Description: [id number] 11042 - WC PHYS SUBQ TISS 20 SQ CM ICD-10 Description Diagnosis E11.621 Type 2 diabetes mellitus with foot ulcer L97.312 Non-pressure chronic ulcer of right ankle with fat Modifier: layer expose Quantity: 1 d Electronic Signature(s) Signed: 11/25/2016 3:41:30 PM By: [name], [name] Entered By: [name] on 11/25/2016 15:41:30

## 2016-11-27 NOTE — Progress Notes (Signed)
[name], [name] ([id number]) Visit Report for 11/25/2016 Arrival Information Details [name]. Date of Service: 11/25/2016 3:30 PM Medical Record Number: [id number] Patient Account Number: [id number] Date of Birth/Sex: [birth year] (61 y.o. Female) Treating RN: Cornell Barman Primary Care Chanci Ojala: [name] Other Clinician: Referring Maryjo Ragon: [name] Treating Taralynn Quiett/Extender: Frann Rider in Treatment: 6 Visit Information History Since Last Visit Added or deleted any medications: Yes Patient Arrived: Wheel Chair Any new allergies or adverse reactions: No Arrival Time: 15:13 Had a fall or experienced change in No Accompanied By: self activities of daily living that may affect Transfer Assistance: None risk of falls: Patient Identification Verified: Yes Signs or symptoms of abuse/neglect since last No Secondary Verification Process Yes visito Completed: Has Dressing in Place as Prescribed: Yes Patient Has Alerts: Yes Pain Present Now: Yes Patient Alerts: Patient on Blood Thinner Electronic Signature(s) Signed: 11/25/2016 4:30:32 PM By: Gretta Cool, RN, [name], Kim RN, [name] Entered By: Gretta Cool, RN, [name], Kim on 11/25/2016 15:15:56 Stempel, [name] ([id number]) -------------------------------------------------------------------------------- Encounter Discharge Information Details [name]. Date of Service: 11/25/2016 3:30 PM Medical Record Number: [id number] Patient Account Number: [id number] Date of Birth/Sex: 1956-03-24 (61 y.o. Female) Treating RN: Cornell Barman Primary Care Marlisha Vanwyk: [name] Other Clinician: Referring Tiernan Suto: [name] Treating Dailey Buccheri/Extender: Frann Rider in Treatment: 6 Encounter Discharge Information Items Schedule Follow-up Appointment: No Medication Reconciliation completed No and provided to Patient/Care Travers Goodley: Provided on Clinical Summary of Care: 11/25/2016 Form Type Recipient Paper  Patient LD Electronic Signature(s) Signed: 11/25/2016 3:36:42 PM By: Ruthine Dose Entered By: Ruthine Dose on 11/25/2016 15:36:42 Fare, [name] ([id number]) -------------------------------------------------------------------------------- Lower Extremity Assessment Details Patient Name: [name], [name] Date of Service: 11/25/2016 3:30 PM Medical Record Number: [id number] Patient Account Number: [id number] Date of Birth/Sex: 12-13-1955 (61 y.o. Female) Treating RN: Cornell Barman Primary Care Kylani Wires: [name] Other Clinician: Referring Akshita Italiano: [name] Treating Ethyle Tiedt/Extender: Frann Rider in Treatment: 6 Edema Assessment Assessed: [Left: No] [Right: No] Edema: [Left: N] [Right: o] Vascular Assessment Pulses: Dorsalis Pedis Palpable: [Right:Yes] Posterior Tibial Extremity colors, hair growth, and conditions: Extremity Color: [Right:Normal] Hair Growth on Extremity: [Right:No] Temperature of Extremity: [Right:Warm] Capillary Refill: [Right:< 3 seconds] Toe Nail Assessment Left: Right: Thick: No Discolored: No Deformed: No Improper Length and Hygiene: No Electronic Signature(s) Signed: 11/25/2016 4:30:32 PM By: Gretta Cool, RN, [name], Kim RN, [name] Entered By: Gretta Cool, RN, [name], Kim on 11/25/2016 15:23:41 Vancleve, [name] ([id number]) -------------------------------------------------------------------------------- Multi Wound Chart Details [name]. Date of Service: 11/25/2016 3:30 PM Medical Record Number: [id number] Patient Account Number: [id number] Date of Birth/Sex: [birth year] (61 y.o. Female) Treating RN: Cornell Barman Primary Care Schylar Allard: [name] Other Clinician: Referring Taylan Marez: [name] Treating Sheniya Garciaperez/Extender: Frann Rider in Treatment: 6 Vital Signs Height(in): 61 Pulse(bpm): 63 Weight(lbs): 160 Blood Pressure 122/58 (mmHg): Body Mass Index(BMI): 30 Temperature(F): 98.2 Respiratory  Rate 16 (breaths/min): Photos: [10:No Photos] [N/A:N/A] Wound Location: [10:Right Malleolus - Lateral N/A] Wounding Event: [10:Trauma] [N/A:N/A] Primary Etiology: [10:Trauma, Other] [N/A:N/A] Comorbid History: [10:Glaucoma, Anemia, Coronary Artery Disease, Hypertension, Type II Diabetes, End Stage Renal Disease, Osteoarthritis, Neuropathy] [N/A:N/A] Date Acquired: [10:09/12/2016] [N/A:N/A] Weeks of Treatment: [10:6] [N/A:N/A] Wound Status: [10:Open] [N/A:N/A] Measurements L x W x D 0.6x0.9x0.3 [N/A:N/A] (cm) Area (cm) : [10:0.424] [N/A:N/A] Volume (cm) : [10:0.127] [N/A:N/A] % Reduction in Area: [10:-49.80%] [N/A:N/A] % Reduction in Volume: -122.80% [N/A:N/A] Starting Position 1 4 (o'clock): Ending Position 1 [10:9] (o'clock): Maximum Distance 1 0.4 (cm): Undermining: [10:Yes] [N/A:N/A] Classification: [10:Full Thickness  Without Exposed Support Structures] [N/A:N/A] HBO Classification: [10:Grade 1] [N/A:N/A] Exudate Amount: Medium N/A N/A Exudate Type: Purulent N/A N/A Exudate Color: yellow, brown, green N/A N/A Wound Margin: Flat and Intact N/A N/A Granulation Amount: None Present (0%) N/A N/A Necrotic Amount: Large (67-100%) N/A N/A Necrotic Tissue: Eschar, Adherent Slough N/A N/A Exposed Structures: Fat Layer (Subcutaneous N/A N/A Tissue) Exposed: Yes Fascia: No Tendon: No Muscle: No Joint: No Bone: No Epithelialization: None N/A N/A Debridement: Debridement (15400- N/A N/A 11047) Pre-procedure 15:28 N/A N/A Verification/Time Out Taken: Pain Control: Other N/A N/A Tissue Debrided: Fibrin/Slough, N/A N/A Subcutaneous Level: Skin/Subcutaneous N/A N/A Tissue Debridement Area (sq 0.54 N/A N/A cm): Instrument: Curette N/A N/A Bleeding: Minimum N/A N/A Hemostasis Achieved: Pressure N/A N/A Procedural Pain: 3 N/A N/A Post Procedural Pain: 3 N/A N/A Debridement Treatment Procedure was tolerated N/A N/A Response: well Post Debridement 0.6x0.5x0.3 N/A  N/A Measurements L x W x D (cm) Post Debridement 0.071 N/A N/A Volume: (cm) Periwound Skin Texture: Excoriation: No N/A N/A Induration: No Callus: No Crepitus: No Rash: No Scarring: No Periwound Skin Maceration: No N/A N/A Moisture: Dry/Scaly: No Periwound Skin Color: Erythema: Yes N/A N/A Atrophie Blanche: No Cyanosis: No Schake, Kristina S. ([id number]) Ecchymosis: No Hemosiderin Staining: No Mottled: No Pallor: No Rubor: No Erythema Location: Circumferential N/A N/A Temperature: No Abnormality N/A N/A Tenderness on Yes N/A N/A Palpation: Wound Preparation: Ulcer Cleansing: N/A N/A Rinsed/Irrigated with Saline Topical Anesthetic Applied: Other: lidocaine 4% Procedures Performed: Debridement N/A N/A Treatment Notes Electronic Signature(s) Signed: 11/25/2016 3:37:35 PM By: [name], [name] Entered By: [name] on 11/25/2016 15:37:35 Borchers, [name] ([id number]) -------------------------------------------------------------------------------- [place] Details [name]. Date of Service: 11/25/2016 3:30 PM Medical Record Number: [id number] Patient Account Number: [id number] Date of Birth/Sex: [birth year] (61 y.o. Female) Treating RN: Cornell Barman Primary Care Mili Piltz: [name] Other Clinician: Referring Kieli Golladay: [name] Treating Seiya Silsby/Extender: Frann Rider in Treatment: 6 Active Inactive ` Abuse / Safety / Falls / Self Care Management Nursing Diagnoses: Potential for falls Goals: Patient will remain injury free Date Initiated: 10/10/2016 Target Resolution Date: 12/23/2016 Goal Status: Active Interventions: Assess fall risk on admission and as needed Notes: ` Nutrition Nursing Diagnoses: Potential for alteratiion in Nutrition/Potential for imbalanced nutrition Goals: Patient/caregiver agrees to and verbalizes understanding of need to use nutritional supplements and/or vitamins as  prescribed Date Initiated: 10/10/2016 Target Resolution Date: 10/21/2016 Goal Status: Active Interventions: Assess patient nutrition upon admission and as needed per policy Notes: ` Orientation to the Wound Care Program Nursing Diagnoses: Knowledge deficit related to the wound healing center program Machuca, RUWEYDA MACKNIGHT ([id number]) Goals: Patient/caregiver will verbalize understanding of the [place] Date Initiated: 10/10/2016 Target Resolution Date: 12/23/2016 Goal Status: Active Interventions: Provide education on orientation to the wound center Notes: ` Wound/Skin Impairment Nursing Diagnoses: Impaired tissue integrity Goals: Patient/caregiver will verbalize understanding of skin care regimen Date Initiated: 10/10/2016 Target Resolution Date: 12/23/2016 Goal Status: Active Ulcer/skin breakdown will have a volume reduction of 30% by week 4 Date Initiated: 10/10/2016 Target Resolution Date: 12/23/2016 Goal Status: Active Ulcer/skin breakdown will have a volume reduction of 50% by week 8 Date Initiated: 10/10/2016 Target Resolution Date: 12/23/2016 Goal Status: Active Ulcer/skin breakdown will have a volume reduction of 80% by week 12 Date Initiated: 10/10/2016 Target Resolution Date: 12/23/2016 Goal Status: Active Ulcer/skin breakdown will heal within 14 weeks Date Initiated: 10/10/2016 Target Resolution Date: 12/23/2016 Goal Status: Active Interventions: Assess patient/caregiver ability to obtain necessary supplies  Assess patient/caregiver ability to perform ulcer/skin care regimen upon admission and as needed Assess ulceration(s) every visit Notes: Electronic Signature(s) Signed: 11/25/2016 4:30:32 PM By: Gretta Cool, RN, [name], Kim RN, [name] Entered By: Gretta Cool, RN, [name], Kim on 11/25/2016 15:27:23 Vanstone, [name] ([id number]) -------------------------------------------------------------------------------- Pain Assessment Details Patient Name: DENEE, BOEDER. Date of Service:  11/25/2016 3:30 PM Medical Record Number: [id number] Patient Account Number: [id number] Date of Birth/Sex: December 20, 1955 (61 y.o. Female) Treating RN: Cornell Barman Primary Care Emyah Roznowski: [name] Other Clinician: Referring Harjot Zavadil: [name] Treating Maila Dukes/Extender: Frann Rider in Treatment: 6 Active Problems Location of Pain Severity and Description of Pain Patient Has Paino Yes Site Locations Pain Location: Pain in Ulcers With Dressing Change: Yes Rate the pain. Current Pain Level: 5 Character of Pain Describe the Pain: Sharp, Tender, Throbbing Pain Management and Medication Current Pain Management: Electronic Signature(s) Signed: 11/25/2016 4:30:32 PM By: Gretta Cool, RN, [name], Kim RN, [name] Entered By: Gretta Cool, RN, [name], Kim on 11/25/2016 15:16:16 Firkus, [name] ([id number]) -------------------------------------------------------------------------------- Wound Assessment Details Patient Name: MEIAH, ZAMUDIO. Date of Service: 11/25/2016 3:30 PM Medical Record Number: [id number] Patient Account Number: [id number] Date of Birth/Sex: 1955/08/14 (61 y.o. Female) Treating RN: Cornell Barman Primary Care Ambrielle Kington: [name] Other Clinician: Referring Alaiyah Bollman: [name] Treating Charnise Lovan/Extender: Frann Rider in Treatment: 6 Wound Status Wound Number: 10 Primary Trauma, Other Etiology: Wound Location: Right Malleolus - Lateral Wound Open Wounding Event: Trauma Status: Date Acquired: 09/12/2016 Comorbid Glaucoma, Anemia, Coronary Artery Weeks Of Treatment: 6 History: Disease, Hypertension, Type II Clustered Wound: No Diabetes, End Stage Renal Disease, Osteoarthritis, Neuropathy Photos Photo Uploaded By: Gretta Cool, RN, [name], Kim on 11/25/2016 16:31:22 Wound Measurements Length: (cm) 0.6 Width: (cm) 0.9 Depth: (cm) 0.3 Area: (cm) 0.424 Volume: (cm) 0.127 % Reduction in Area: -49.8% % Reduction in Volume: -122.8% Epithelialization: None Tunneling:  No Undermining: Yes Starting Position (o'clock): 4 Ending Position (o'clock): 9 Maximum Distance: (cm) 0.4 Wound Description Full Thickness Without Classification: Exposed Support Structures Diabetic Severity Grade 1 (Wagner): Wound Margin: Flat and Intact Exudate Amount: Medium Exudate Type: Purulent Exudate Color: yellow, brown, green Arterberry, Wilberta S. ([id number]) Foul Odor After Cleansing: No Slough/Fibrino Yes Wound Bed Granulation Amount: None Present (0%) Exposed Structure Necrotic Amount: Large (67-100%) Fascia Exposed: No Necrotic Quality: Eschar, Adherent Slough Fat Layer (Subcutaneous Tissue) Exposed: Yes Tendon Exposed: No Muscle Exposed: No Joint Exposed: No Bone Exposed: No Periwound Skin Texture Texture Color No Abnormalities Noted: No No Abnormalities Noted: No Callus: No Atrophie Blanche: No Crepitus: No Cyanosis: No Excoriation: No Ecchymosis: No Induration: No Erythema: Yes Rash: No Erythema Location: Circumferential Scarring: No Hemosiderin Staining: No Mottled: No Moisture Pallor: No No Abnormalities Noted: No Rubor: No Dry / Scaly: No Maceration: No Temperature / Pain Temperature: No Abnormality Tenderness on Palpation: Yes Wound Preparation Ulcer Cleansing: Rinsed/Irrigated with Saline Topical Anesthetic Applied: Other: lidocaine 4%, Treatment Notes Wound #10 (Right, Lateral Malleolus) 1. Cleansed with: Clean wound with Normal Saline 2. Anesthetic Topical Lidocaine 4% cream to wound bed prior to debridement 4. Dressing Applied: Santyl Ointment 5. Secondary Dressing Applied Bordered Foam Dressing Electronic Signature(s) Signed: 11/25/2016 4:30:32 PM By: Gretta Cool, RN, [name], Kim RN, [name] Entered By: Gretta Cool, RN, [name], Kim on 11/25/2016 15:22:04 Eisel, [name] ([id number]) -------------------------------------------------------------------------------- [place] Details [name]. Date of Service: 11/25/2016 3:30  PM Medical Record Number: [id number] Patient Account Number: [id number] Date of Birth/Sex: 15-Dec-1955 (61 y.o. Female) Treating RN: Cornell Barman Primary Care Van Ehlert: [name] Other Clinician: Referring Chastidy Ranker: [name]  Treating Mitsuru Dault/Extender: Frann Rider in Treatment: 6 Vital Signs Time Taken: 15:17 Temperature (F): 98.2 Height (in): 61 Pulse (bpm): 63 Weight (lbs): 160 Respiratory Rate (breaths/min): 16 Body Mass Index (BMI): 30.2 Blood Pressure (mmHg): 122/58 Reference Range: 80 - 120 mg / dl Electronic Signature(s) Signed: 11/25/2016 4:30:32 PM By: Gretta Cool, RN, [name], Kim RN, [name] Entered By: Gretta Cool, RN, [name], Kim on 11/25/2016 15:25:11

## 2016-11-30 ENCOUNTER — Ambulatory Visit
Admission: RE | Admit: 2016-11-30 | Discharge: 2016-11-30 | Disposition: A | Discharge status: Home / Self Care | Payer: Medicare Other | Source: Ambulatory Visit | Attending: Urology | Admitting: Urology

## 2016-11-30 DIAGNOSIS — I2510 Atherosclerotic heart disease of native coronary artery without angina pectoris: Secondary | ICD-10-CM | POA: Diagnosis not present

## 2016-11-30 DIAGNOSIS — J90 Pleural effusion, not elsewhere classified: Secondary | ICD-10-CM | POA: Diagnosis not present

## 2016-11-30 DIAGNOSIS — I700 Atherosclerosis of aorta: Secondary | ICD-10-CM | POA: Insufficient documentation

## 2016-11-30 DIAGNOSIS — N2889 Other specified disorders of kidney and ureter: Secondary | ICD-10-CM | POA: Insufficient documentation

## 2016-11-30 MED ORDER — IOPAMIDOL (ISOVUE-370) INJECTION 76%
100.0000 mL | Freq: Once | INTRAVENOUS | Status: AC | PRN
  Administered 2016-11-30: 100 mL via INTRAVENOUS

## 2016-12-02 ENCOUNTER — Ambulatory Visit (INDEPENDENT_AMBULATORY_CARE_PROVIDER_SITE_OTHER): Payer: Medicare Other | Admitting: Urology

## 2016-12-02 ENCOUNTER — Encounter: Payer: Self-pay | Admitting: Urology

## 2016-12-02 VITALS — BP 159/72 | HR 90 | Ht 61.0 in | Wt 162.0 lb

## 2016-12-02 DIAGNOSIS — N2889 Other specified disorders of kidney and ureter: Secondary | ICD-10-CM | POA: Diagnosis not present

## 2016-12-02 DIAGNOSIS — N186 End stage renal disease: Secondary | ICD-10-CM

## 2016-12-02 DIAGNOSIS — R0989 Other specified symptoms and signs involving the circulatory and respiratory systems: Secondary | ICD-10-CM

## 2016-12-02 DIAGNOSIS — S42213A Unspecified displaced fracture of surgical neck of unspecified humerus, initial encounter for closed fracture: Secondary | ICD-10-CM | POA: Insufficient documentation

## 2016-12-02 DIAGNOSIS — Z992 Dependence on renal dialysis: Secondary | ICD-10-CM | POA: Diagnosis not present

## 2016-12-02 DIAGNOSIS — S42253A Displaced fracture of greater tuberosity of unspecified humerus, initial encounter for closed fracture: Secondary | ICD-10-CM | POA: Insufficient documentation

## 2016-12-02 LAB — URINALYSIS, COMPLETE
Bilirubin, UA: NEGATIVE
Glucose, UA: NEGATIVE
Ketones, UA: NEGATIVE
Leukocytes, UA: NEGATIVE
Nitrite, UA: NEGATIVE
PH UA: 6 (ref 5.0–7.5)
Specific Gravity, UA: 1.015 (ref 1.005–1.030)
Urobilinogen, Ur: 0.2 mg/dL (ref 0.2–1.0)

## 2016-12-02 LAB — MICROSCOPIC EXAMINATION

## 2016-12-02 NOTE — Progress Notes (Signed)
12/02/2016 3:05 PM   [name] [birth year] [id number]  Referring provider: Marden Noble, MD [address] [place], [place] [address]  Chief Complaint  Patient presents with  . Pre-op Exam    renal mass    HPI: 61 year old female with history of end-stage renal disease on hemodialysis since 2011. Presents today for follow-up of a known left renal mass.  She was previously followed by Dr. 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  More recently, she underwent CT with oral but without IV contrast ordered by her PCP, Dr. Chancy Milroy which showed a mass arising from the lower pole of the kidney measuring 2.2 x 3.2 x 3.5 cm. The lesion was not unable to be fully characterized due to lack of contrast.  It did appear moderately hypoechoic consistent with previous ablation.  She underwent follow-up CT abdomen with and without contrast for further characterization of this mass on 11/30/2016. This showed an enlarging, heterogeneous enhancing lower pole mass measuring 3.4 cm, previously 2.4 cm (3/16) in the left lower pole.  Denies any flank pain or gross hematuria. She does void spontaneously every day without difficulty.  She has multiple medical comorbidities as below, COPD on 02.    Previous abd surgeries includes lap cholecystectomy, open abd hysterectomy, and PD diaylsis catheter placement.    PMH: Past Medical History:  Diagnosis Date  . Atrial fibrillation ([place])   . CAD (coronary artery disease)   . CHF (congestive heart failure) ([place])   . CKD (chronic kidney disease)   . COPD (chronic obstructive pulmonary disease) ([place])   . Diabetes ([place])   . Diabetic peripheral neuropathy associated with type 2 diabetes mellitus ([place])   . Dialysis patient ([place])   . GERD (gastroesophageal reflux disease)   . HTN (hypertension)   . MI (myocardial infarction) ([place]) 05-17-2014  . Pancreatitis   . Peripheral autonomic neuropathy due to diabetes mellitus  [facility])     Surgical History: Past Surgical History:  Procedure Laterality Date  . ABDOMINAL HYSTERECTOMY    . AV FISTULA PLACEMENT Left 12/25/2014   Procedure: ARTERIOVENOUS (AV) FISTULA CREATION;  Surgeon: Algernon Huxley, MD;  Location: [facility] ORS;  Service: Vascular;  Laterality: Left;  . BACK SURGERY    . CHOLECYSTECTOMY    . CORONARY ANGIOPLASTY     Cardiac stents  . EYE SURGERY     bilateral cataract extractions  . LEFT HEART CATHETERIZATION WITH CORONARY ANGIOGRAM N/A 05/20/2014   Procedure: LEFT HEART CATHETERIZATION WITH CORONARY ANGIOGRAM;  Surgeon: Clent Demark, MD;  Location: [place] CATH LAB;  Service: Cardiovascular;  Laterality: N/A;  . PERIPHERAL VASCULAR CATHETERIZATION N/A 02/12/2015   Procedure: A/V Shuntogram/Fistulagram;  Surgeon: Algernon Huxley, MD;  Location: [facility] CV LAB;  Service: Cardiovascular;  Laterality: N/A;  . PERIPHERAL VASCULAR CATHETERIZATION N/A 02/12/2015   Procedure: A/V Shunt Intervention;  Surgeon: Algernon Huxley, MD;  Location: [facility] CV LAB;  Service: Cardiovascular;  Laterality: N/A;  . PERIPHERAL VASCULAR CATHETERIZATION N/A 04/09/2015   Procedure: Dialysis/Perma Catheter Removal;  Surgeon: Algernon Huxley, MD;  Location: [facility] CV LAB;  Service: Cardiovascular;  Laterality: N/A;  . PORTACATH PLACEMENT      Home Medications:  Allergies as of 12/02/2016      Reactions   Adhesive [tape] Other (See Comments)   Reaction:  Blisters    Shellfish Allergy Other (See Comments)   Reaction:  Unknown    Doxycycline Rash  Medication List       Accurate as of 12/02/16 11:59 PM. Always use your most recent med list.          albuterol (2.5 MG/3ML) 0.083% NEBU 3 mL, albuterol (5 MG/ML) 0.5% NEBU 0.5 mL Inhale into the lungs.   amLODipine 10 MG tablet Commonly known as:  NORVASC amlodipine 10 mg tablet   aspirin EC 81 MG tablet Take 81 mg by mouth daily.   atorvastatin 20 MG tablet Commonly known as:  LIPITOR atorvastatin 20 mg  tablet   CALCIUM 600+D 600-200 MG-UNIT Tabs Generic drug:  Calcium Carbonate-Vitamin D Calcium 600   clopidogrel 75 MG tablet Commonly known as:  PLAVIX clopidogrel 75 mg tablet   COLACE 100 MG capsule Generic drug:  docusate sodium docusate sodium 100 mg capsule  Take 1 capsule every day by oral route.   furosemide 80 MG tablet Commonly known as:  LASIX furosemide 80 mg tablet   HUMALOG 100 UNIT/ML injection Generic drug:  insulin lispro Humalog U-100 Insulin 100 unit/mL subcutaneous solution   hydrALAZINE 50 MG tablet Commonly known as:  APRESOLINE Take 50 mg by mouth 2 (two) times daily.   isosorbide dinitrate 30 MG tablet Commonly known as:  ISORDIL Take 30 mg by mouth daily.   labetalol 300 MG tablet Commonly known as:  NORMODYNE Take 300 mg by mouth 2 (two) times daily.   loratadine 10 MG tablet Commonly known as:  CLARITIN Take 10 mg by mouth daily.   losartan 50 MG tablet Commonly known as:  COZAAR Take 50 mg by mouth 2 (two) times daily.   LYRICA 75 MG capsule Generic drug:  pregabalin Lyrica 75 mg capsule   magnesium oxide 400 MG tablet Commonly known as:  MAG-OX Take 400 mg by mouth daily.   metoCLOPramide 5 MG tablet Commonly known as:  REGLAN metoclopramide 5 mg tablet   pantoprazole 40 MG tablet Commonly known as:  PROTONIX pantoprazole 40 mg tablet,delayed release   predniSONE 10 MG tablet Commonly known as:  DELTASONE Take 10 mg by mouth daily with breakfast.   promethazine 25 MG tablet Commonly known as:  PHENERGAN Take 25 mg by mouth every 6 (six) hours as needed for nausea or vomiting.   rOPINIRole 0.25 MG tablet Commonly known as:  REQUIP ropinirole 0.25 mg tablet   tiotropium 18 MCG inhalation capsule Commonly known as:  SPIRIVA Place 18 mcg into inhaler and inhale daily.   tiZANidine 4 MG tablet Commonly known as:  ZANAFLEX tizanidine 4 mg tablet   TOUJEO SOLOSTAR 300 UNIT/ML Sopn Generic drug:  Insulin  Glargine Inject into the skin.       Allergies:  Allergies  Allergen Reactions  . Adhesive [Tape] Other (See Comments)    Reaction:  Blisters   . Shellfish Allergy Other (See Comments)    Reaction:  Unknown   . Doxycycline Rash    Family History: Family History  Problem Relation Age of Onset  . Diabetes Mother   . Hypertension Mother   . Breast cancer Maternal Grandmother   . Breast cancer Paternal Aunt     Social History:  reports that she has never smoked. She has never used smokeless tobacco. She reports that she does not drink alcohol or use drugs.  ROS: UROLOGY Frequent Urination?: No Hard to postpone urination?: No Burning/pain with urination?: No Get up at night to urinate?: No Leakage of urine?: No Urine stream starts and stops?: No Trouble starting stream?: No Do you have to  strain to urinate?: No Blood in urine?: No Urinary tract infection?: No Sexually transmitted disease?: No Injury to kidneys or bladder?: No Painful intercourse?: No Weak stream?: No Currently pregnant?: No Vaginal bleeding?: No Last menstrual period?: n  Gastrointestinal Nausea?: No Vomiting?: No Indigestion/heartburn?: No Diarrhea?: No Constipation?: No  Constitutional Fever: No Night sweats?: No Weight loss?: No Fatigue?: Yes  Skin Skin rash/lesions?: No Itching?: No  Eyes Blurred vision?: No Double vision?: No  Ears/Nose/Throat Sore throat?: No Sinus problems?: Yes  Hematologic/Lymphatic Swollen glands?: No Easy bruising?: Yes  Cardiovascular Leg swelling?: No Chest pain?: No  Respiratory Cough?: Yes Shortness of breath?: Yes  Endocrine Excessive thirst?: No  Musculoskeletal Back pain?: No Joint pain?: No  Neurological Headaches?: No Dizziness?: Yes  Psychologic Depression?: No Anxiety?: No  Physical Exam: BP (!) 159/72   Pulse 90   Ht 5\' 1"  (1.549 m)   Wt 162 lb (73.5 kg)   BMI 30.61 kg/m   Constitutional:  Alert and oriented,  No acute distress.  In wheelchair.  Appears older than stated age. HEENT:  AT, moist mucus membranes.  Trachea midline, no masses. Cardiovascular: No clubbing, cyanosis, or edema. Respiratory: Normal respiratory effort, no increased work of breathing.  Oxygen take attached to wheelchair but not wearing currently. GI: Abdomen is soft, nontender, nondistended, no abdominal masses.  Previous PD catheter site overlying left mid abdomen. GU: No CVA tenderness.  Skin: No rashes, bruises or suspicious lesions.. Neurologic: Grossly intact, no focal deficits, moving all 4 extremities. Psychiatric: Normal mood and affect.  Laboratory Data: Lab Results  Component Value Date   WBC 11.3 (H) 09/30/2015   HGB 9.6 (L) 09/30/2015   HCT 29.0 (L) 09/30/2015   MCV 89.8 09/30/2015   PLT 183 09/30/2015    Lab Results  Component Value Date   CREATININE 4.21 (H) 09/30/2015    Lab Results  Component Value Date   HGBA1C 6.2 (H) 05/20/2014    Urinalysis n/a  Pertinent Imaging: CLINICAL DATA:  Right flank pain for 5 days. Left renal cell carcinoma and cryo ablation.  EXAM: CT ABDOMEN WITHOUT AND WITH CONTRAST  TECHNIQUE: Multidetector CT imaging of the abdomen was performed following the standard protocol before and following the bolus administration of intravenous contrast.  CONTRAST:  100 cc Isovue 370.  COMPARISON:  09/16/2014.  FINDINGS: Lower chest: Thin rind of pleural fluid in the right hemithorax. Small left pleural effusion with collapse/ consolidation in the left lower lobe. Mild septal thickening in the right lower lobe. Heart is enlarged. Coronary artery calcification. No pericardial effusion.  Hepatobiliary: Liver measures 21.4 cm and is somewhat heterogeneous. Cholecystectomy. No biliary ductal dilatation.  Pancreas: Negative.  Spleen: Negative.  Adrenals/Urinary Tract: Adrenal glands are unremarkable. Low-attenuation lesions in the kidneys measure up to 13  mm on the right, too small to definitively characterize but statistically, cysts are most likely. A heterogeneous enhancing mass in the lower pole left kidney measures 3.4 cm, previously 2.4 cm. Cryoablation defect along the posterior interpolar left kidney, stable. Left kidney is non rotated and atrophic. Multiple renal vascular calcifications bilaterally.  Stomach/Bowel: Stomach and visualized portions of the small bowel and colon are unremarkable.  Vascular/Lymphatic: Atherosclerotic calcification of the arterial vasculature without abdominal aortic aneurysm. No pathologically enlarged lymph nodes.  Other: Followup small periumbilical hernia contains fat. No free fluid. Mesenteries and peritoneum are otherwise unremarkable.  Musculoskeletal: Degenerative changes in the spine. No worrisome lytic or sclerotic lesions. Old bilateral rib fractures.  IMPRESSION: 1. Enlarging, heterogeneous and  enhancing mass in the lower pole left kidney, worrisome for renal cell carcinoma. These results will be called to the ordering clinician or representative by the Radiologist Assistant, and communication documented in the PACS or zVision Dashboard. 2. Small left pleural effusion with collapse/consolidation in the left lower lobe. Pneumonia is not excluded. 3. Thin rind of pleural fluid in the right hemithorax. 4. Aortic atherosclerosis (ICD10-170.0). Coronary artery calcification. 5. Liver is enlarged and appears heterogeneous, which can be seen with fat deposition.   Electronically Signed   By: Lorin Picket M.D.   On: 11/30/2016 13:41  CT scan was reviewed personally today. These images were also reviewed with Dr. Sharlee Blew.    Assessment & Plan:    1. Renal mass History of left lower pole renal mass status post cryoablation 2013.  Most recent CT imaging, interval increased in size and contrast enhancement which is suspicious for enlarging renal cell carcinoma- growth rate  greater than 0.3 cm annually.  A solid renal mass raises the suspicion of primary renal malignancy.  We discussed this in detail and in regards to the spectrum of renal masses which includes cysts (pure cysts are considered benign), solid masses and everything in between. The risk of metastasis increases as the size of solid renal mass increases. In general, it is believed that the risk of metastasis for renal masses less than 3-4 cm is small (up to approximately 5%) based mainly on large retrospective studies. In some cases and especially in patients of older age and multiple comorbidities a surveillance approach may be appropriate. The treatment of solid renal masses includes: surveillance, cryoablation (percutaneous and laparoscopic) in addition to partial and complete nephrectomy (each with option of laparoscopic, robotic and open depending on appropriateness). We reviewed these options in context of the patients current situation as well as the pros and cons of each.  Given her comorbidities and previous abdominal surgery/peritoneal dialysis, I'm concerned that she would have difficulty postop.  She is not an ideal surgical candidate.  I did discuss the case with interventional radiology today and they do feel that the lesion is amenable to cryoablation. We did briefly discuss the risk and benefits of this. We will refer her to interventional radiology discuss this further.  She is agreeable with this plan. I will see her after her consult and possible cryoablation.  2. ESRD on dialysis ([place]) As above  3. Incidental pulmonary findings CT scan findings lung were discussed with the patient. She has had increased shortness of breath. She does have a pulmonologist in follows regularly. She will call her pulmonologist this afternoon and schedule a follow-up for next week.   Hollice Espy, MD  [place], [place] [address] [phone]  I spent 25 min with this  patient of which greater than 50% was spent in counseling and coordination of care with the patient.

## 2016-12-05 ENCOUNTER — Other Ambulatory Visit: Payer: Self-pay | Admitting: Internal Medicine

## 2016-12-05 ENCOUNTER — Encounter: Payer: Medicare Other | Admitting: Surgery

## 2016-12-05 ENCOUNTER — Other Ambulatory Visit: Payer: Self-pay | Admitting: Urology

## 2016-12-05 DIAGNOSIS — E11621 Type 2 diabetes mellitus with foot ulcer: Secondary | ICD-10-CM | POA: Diagnosis not present

## 2016-12-05 DIAGNOSIS — J918 Pleural effusion in other conditions classified elsewhere: Secondary | ICD-10-CM

## 2016-12-05 DIAGNOSIS — N2889 Other specified disorders of kidney and ureter: Secondary | ICD-10-CM

## 2016-12-06 NOTE — Progress Notes (Signed)
[name], [name] ([id number]) Visit Report for 12/05/2016 Chief Complaint Document Details [name], [name]. Date of Service: 12/05/2016 9:15 AM Medical Record Number: [id number] Patient Account Number: [id number] Date of Birth/Sex: 1955/10/12 (61 y.o. Female) Treating RN: Montey Hora Primary Care Provider: [name] Other Clinician: Referring Provider: [name] Treating Provider/Extender: Frann Rider in Treatment: 8 Information Obtained from: Patient Chief Complaint Patients presents for treatment of an open diabetic ulcer right lateral ankle which she's had for about a month Electronic Signature(s) Signed: 12/05/2016 9:56:42 AM By: [name], [name] Entered By: [name] on 12/05/2016 09:56:41 Currey, Paulita S. ([id number]) -------------------------------------------------------------------------------- Debridement Details [name]. Date of Service: 12/05/2016 9:15 AM Medical Record Number: [id number] Patient Account Number: [id number] Date of Birth/Sex: May 11, 1956 (61 y.o. Female) Treating RN: Montey Hora Primary Care Provider: [name] Other Clinician: Referring Provider: [name] Treating Provider/Extender: Frann Rider in Treatment: 8 Debridement Performed for Wound #10 Right,Lateral Malleolus Assessment: Performed By: Physician [name], MD Debridement: Debridement Pre-procedure Verification/Time Out Yes - 09:26 Taken: Start Time: 09:26 Pain Control: Lidocaine 4% Topical Solution Level: Skin/Subcutaneous Tissue Total Area Debrided (L x 0.6 (cm) x 0.6 (cm) = 0.36 (cm) W): Tissue and other Viable, Non-Viable, Fibrin/Slough, Subcutaneous material debrided: Instrument: Curette Bleeding: Minimum Hemostasis Achieved: Pressure End Time: 09:28 Response to Treatment: Procedure was tolerated well Post Debridement Measurements of Total Wound Length: (cm) 0.6 Width: (cm) 0.6 Depth: (cm)  0.3 Volume: (cm) 0.085 Character of Wound/Ulcer Post Improved Debridement: Post Procedure Diagnosis Same as Pre-procedure Electronic Signature(s) Signed: 12/05/2016 9:56:34 AM By: [name], [name] Signed: 12/05/2016 4:49:11 PM By: Montey Hora Entered By: [name] on 12/05/2016 09:56:34 [name], [name] ([id number]) -------------------------------------------------------------------------------- HPI Details [name]. Date of Service: 12/05/2016 9:15 AM Medical Record Number: [id number] Patient Account Number: [id number] Date of Birth/Sex: 07-Mar-1956 (61 y.o. Female) Treating RN: Montey Hora Primary Care Provider: [name] Other Clinician: Referring Provider: [name] Treating Provider/Extender: Frann Rider in Treatment: 8 History of Present Illness Location: right lateral ankle Quality: Patient reports No Pain. Severity: Patient states wound (s) are getting better. Duration: Patient has had the wound for > 1 months prior to seeking treatment at the wound center Context: The wound appeared gradually over time, and she thinks she might have hit it with a walker Modifying Factors: Patient is currently on renal dialysis and receives treatments 3 times weekly HPI Description: 61 year old patient known to our practice from several previous visits for various problems now comes back with an ulcerated area on her right lateral malleolus which she's had for about a month and she thinks she may have hit it with her walker. No other problems and no x-ray of this has been taken. She has a past medical history of atrial fibrillation, carotid artery disease, CHF, chronic kidney disease on hemodialysis with a functioning AV fistula seen by Dr. Corene Cornea dew, diabetes mellitus with peripheral neuropathy, pancreatitis and is status post abdominal hysterectomy, AV fistula placement in June 2016, back surgery, cholecystectomy, coronary  angioplasty,several surgeries for AV fistula, and dialysis catheters. She has never been a smoker. 10/17/2016 -- had a right ankle x-ray which showed no acute pathology of the right ankle. Arterial vascular calcifications are noted and there is generalized osteopenia and demineralization 11/11/16 Patient appears to be doing fairly well with regard to her right ankle wound. She has been tolerating the dressing changes with the Santyl that does seem to have loosened up the  slough a bit. She has also been tolerating the debridement's when they have been performed. She tells me that she does tend to lay on her right side and this seems to be aggravating the issue. She does not have a Prevalon boot for use at this facility. Fortunately she is not having significant pain. ===== Old notes: 61 year old patient who is known to be a diabetic and has end-stage renal disease has had several comorbidities including coronary artery disease, hypertension, hyperlipidemia, pancreatitis, anemia, previous history of hysterectomy, cholecystectomy, left-sided salivary gland excision, bilateral cataract surgery,Peritoneal dialysis catheter, hemodialysis catheter. the area on the back has also been caused by instant pressure she used to sleep on a recliner all day and has significant kyphoscoliosis. As far as the wound on her right lower extremity she's not sure how this blister occurred but she thought it has been there for about 2 weeks. No recent blood investigations available and no recent hemoglobin A1c. 12/30/2014 -- she is an assisted living facility but I believe the nurses that have not followed instructions as she had some cream applied on her back and there was a different dressing. Last week she's had a AV Hinchcliff, Willodene S. ([id number]) fistula placed on her left forearm. 01/13/2015 -- she has had some localized infection at the port site and she's been on doxycycline for this. 02/05/2015 - he has  developed a small blister on her right lower extremity. 02/10/2015 -- she has developed another small blister on her right anterior chest wall in the area where she's had tape for her dialysis access. this may just be injury caused by a tape burn. She had a dermatology opinion and they have taken a biopsy of her skin. She also had a left brachial AV fistula placed this week. ========= Electronic Signature(s) Signed: 12/05/2016 9:56:48 AM By: [name], [name] Entered By: [name] on 12/05/2016 09:56:47 Noda, [name] ([id number]) -------------------------------------------------------------------------------- Physical Exam Details Patient Name: Panther, 88. Date of Service: 12/05/2016 9:15 AM Medical Record Number: [id number] Patient Account Number: [id number] Date of Birth/Sex: [birth year] (61 y.o. Female) Treating RN: Montey Hora Primary Care Provider: [name] Other Clinician: Referring Provider: [name] Treating Provider/Extender: Frann Rider in Treatment: 8 Constitutional . Pulse regular. Respirations normal and unlabored. Afebrile. . Eyes Nonicteric. Reactive to light. Ears, Nose, Mouth, and Throat Lips, teeth, and gums WNL.Marland Kitchen Moist mucosa without lesions. Neck supple and nontender. No palpable supraclavicular or cervical adenopathy. Normal sized without goiter. Respiratory WNL. No retractions.. Breath sounds WNL, No rubs, rales, rhonchi, or wheeze.. Cardiovascular Heart rhythm and rate regular, no murmur or gallop.. Pedal Pulses WNL. No clubbing, cyanosis or edema. Chest Breasts symmetical and no nipple discharge.. Breast tissue WNL, no masses, lumps, or tenderness.. Lymphatic No adneopathy. No adenopathy. No adenopathy. Musculoskeletal Adexa without tenderness or enlargement.. Digits and nails w/o clubbing, cyanosis, infection, petechiae, ischemia, or inflammatory conditions.. Integumentary (Hair, Skin) No suspicious lesions. No crepitus  or fluctuance. No peri-wound warmth or erythema. No masses.Marland Kitchen Psychiatric Judgement and insight Intact.. No evidence of depression, anxiety, or agitation.. Notes the surrounding skin around the right ankle ulceration looks clean and there is no maceration of fungal infection. The base of the wound needed sharp debridement with a #3 curet and still does not show healthy granulation tissue. Minimal bleeding controlled with pressure Electronic Signature(s) Signed: 12/05/2016 9:57:26 AM By: [name], [name] Entered By: [name] on 12/05/2016 09:57:25 Roll, [name] ([id number]) -------------------------------------------------------------------------------- Physician Orders Details [name].  Date of Service: 12/05/2016 9:15 AM Medical Record Number: [id number] Patient Account Number: [id number] Date of Birth/Sex: [birth year] (61 y.o. Female) Treating RN: Montey Hora Primary Care Provider: [name] Other Clinician: Referring Provider: [name] Treating Provider/Extender: Frann Rider in Treatment: 8 Verbal / Phone Orders: No Diagnosis Coding Wound Cleansing Wound #10 Right,Lateral Malleolus o Clean wound with Normal Saline. o May Shower, gently pat wound dry prior to applying new dressing. Anesthetic Wound #10 Right,Lateral Malleolus o Topical Lidocaine 4% cream applied to wound bed prior to debridement Skin Barriers/Peri-Wound Care Wound #10 Right,Lateral Malleolus o Antifungal powder-Nystatin Primary Wound Dressing Wound #10 Right,Lateral Malleolus o Santyl Ointment Secondary Dressing Wound #10 Right,Lateral Malleolus o Dry Gauze o Boardered Foam Dressing Dressing Change Frequency Wound #10 Right,Lateral Malleolus o Change dressing every day. Follow-up Appointments Wound #10 Right,Lateral Malleolus o Return Appointment in 2 weeks. Edema Control Wound #10 Right,Lateral Malleolus o Patient to wear own  compression stockings LANDY, MACE ([id number]) Electronic Signature(s) Signed: 12/05/2016 4:05:08 PM By: [name], [name] Signed: 12/05/2016 4:49:11 PM By: Montey Hora Entered By: Montey Hora on 12/05/2016 09:29:30 Soeder, [name] ([id number]) -------------------------------------------------------------------------------- Problem List Details Patient Name: Orsborn, Shamaria S. Date of Service: 12/05/2016 9:15 AM Medical Record Number: [id number] Patient Account Number: [id number] Date of Birth/Sex: 12/27/1955 (61 y.o. Female) Treating RN: Montey Hora Primary Care Provider: [name] Other Clinician: Referring Provider: [name] Treating Provider/Extender: Frann Rider in Treatment: 8 Active Problems ICD-10 Encounter Code Description Active Date Diagnosis E11.621 Type 2 diabetes mellitus with foot ulcer 10/10/2016 Yes N18.6 End stage renal disease 10/10/2016 Yes Z99.2 Dependence on renal dialysis 10/10/2016 Yes L97.312 Non-pressure chronic ulcer of right ankle with fat layer 10/10/2016 Yes exposed Inactive Problems Resolved Problems Electronic Signature(s) Signed: 12/05/2016 9:56:16 AM By: [name], [name] Entered By: [name] on 12/05/2016 09:56:16 Geist, [name] ([id number]) -------------------------------------------------------------------------------- Progress Note Details Patient Name: Gortney, Octavia S. Date of Service: 12/05/2016 9:15 AM Medical Record Number: [id number] Patient Account Number: [id number] Date of Birth/Sex: 1956/04/01 (61 y.o. Female) Treating RN: Montey Hora Primary Care Provider: [name] Other Clinician: Referring Provider: [name] Treating Provider/Extender: Frann Rider in Treatment: 8 Subjective Chief Complaint Information obtained from Patient Patients presents for treatment of an open diabetic ulcer right lateral ankle which she's had for about a month History of Present Illness (HPI) The  following HPI elements were documented for the patient's wound: Location: right lateral ankle Quality: Patient reports No Pain. Severity: Patient states wound (s) are getting better. Duration: Patient has had the wound for > 1 months prior to seeking treatment at the wound center Context: The wound appeared gradually over time, and she thinks she might have hit it with a walker Modifying Factors: Patient is currently on renal dialysis and receives treatments 3 times weekly 61 year old patient known to our practice from several previous visits for various problems now comes back with an ulcerated area on her right lateral malleolus which she's had for about a month and she thinks she may have hit it with her walker. No other problems and no x-ray of this has been taken. She has a past medical history of atrial fibrillation, carotid artery disease, CHF, chronic kidney disease on hemodialysis with a functioning AV fistula seen by Dr. Corene Cornea dew, diabetes mellitus with peripheral neuropathy, pancreatitis and is status post abdominal hysterectomy, AV fistula placement in June 2016, back surgery, cholecystectomy, coronary angioplasty,several surgeries for AV fistula, and dialysis catheters. She has never been a  smoker. 10/17/2016 -- had a right ankle x-ray which showed no acute pathology of the right ankle. Arterial vascular calcifications are noted and there is generalized osteopenia and demineralization 11/11/16 Patient appears to be doing fairly well with regard to her right ankle wound. She has been tolerating the dressing changes with the Santyl that does seem to have loosened up the slough a bit. She has also been tolerating the debridement's when they have been performed. She tells me that she does tend to lay on her right side and this seems to be aggravating the issue. She does not have a Prevalon boot for use at this facility. Fortunately she is not having significant pain. ===== Old  notes: 61 year old patient who is known to be a diabetic and has end-stage renal disease has had several comorbidities including coronary artery disease, hypertension, hyperlipidemia, pancreatitis, anemia, previous history of hysterectomy, cholecystectomy, left-sided salivary gland excision, bilateral cataract surgery,Peritoneal dialysis catheter, Calzada, Eveleigh S. ([id number]) hemodialysis catheter. the area on the back has also been caused by instant pressure she used to sleep on a recliner all day and has significant kyphoscoliosis. As far as the wound on her right lower extremity she's not sure how this blister occurred but she thought it has been there for about 2 weeks. No recent blood investigations available and no recent hemoglobin A1c. 12/30/2014 -- she is an assisted living facility but I believe the nurses that have not followed instructions as she had some cream applied on her back and there was a different dressing. Last week she's had a AV fistula placed on her left forearm. 01/13/2015 -- she has had some localized infection at the port site and she's been on doxycycline for this. 02/05/2015 - he has developed a small blister on her right lower extremity. 02/10/2015 -- she has developed another small blister on her right anterior chest wall in the area where she's had tape for her dialysis access. this may just be injury caused by a tape burn. She had a dermatology opinion and they have taken a biopsy of her skin. She also had a left brachial AV fistula placed this week. ========= Objective Constitutional Pulse regular. Respirations normal and unlabored. Afebrile. Vitals Time Taken: 9:08 AM, Height: 61 in, Weight: 160 lbs, BMI: 30.2, Temperature: 98.4 F, Pulse: 82 bpm, Respiratory Rate: 18 breaths/min, Blood Pressure: 149/57 mmHg. Eyes Nonicteric. Reactive to light. Ears, Nose, Mouth, and Throat Lips, teeth, and gums WNL.Marland Kitchen Moist mucosa without lesions. Neck supple and  nontender. No palpable supraclavicular or cervical adenopathy. Normal sized without goiter. Respiratory WNL. No retractions.. Breath sounds WNL, No rubs, rales, rhonchi, or wheeze.. Cardiovascular Heart rhythm and rate regular, no murmur or gallop.. Pedal Pulses WNL. No clubbing, cyanosis or edema. Chest Breasts symmetical and no nipple discharge.. Breast tissue WNL, no masses, lumps, or tenderness.[name] ([id number]) Lymphatic No adneopathy. No adenopathy. No adenopathy. Musculoskeletal Adexa without tenderness or enlargement.. Digits and nails w/o clubbing, cyanosis, infection, petechiae, ischemia, or inflammatory conditions.Marland Kitchen Psychiatric Judgement and insight Intact.. No evidence of depression, anxiety, or agitation.. General Notes: the surrounding skin around the right ankle ulceration looks clean and there is no maceration of fungal infection. The base of the wound needed sharp debridement with a #3 curet and still does not show healthy granulation tissue. Minimal bleeding controlled with pressure Integumentary (Hair, Skin) No suspicious lesions. No crepitus or fluctuance. No peri-wound warmth or erythema. No masses.. Wound #10 status is Open. Original cause of wound was Trauma. The wound  is located on the Right,Lateral Malleolus. The wound measures 0.6cm length x 0.6cm width x 0.2cm depth; 0.283cm^2 area and 0.057cm^3 volume. There is Fat Layer (Subcutaneous Tissue) Exposed exposed. There is no tunneling or undermining noted. There is a medium amount of purulent drainage noted. The wound margin is flat and intact. There is no granulation within the wound bed. There is a large (67-100%) amount of necrotic tissue within the wound bed including Eschar and Adherent Slough. The periwound skin appearance exhibited: Erythema. The periwound skin appearance did not exhibit: Callus, Crepitus, Excoriation, Induration, Rash, Scarring, Dry/Scaly, Maceration, Atrophie Blanche, Cyanosis,  Ecchymosis, Hemosiderin Staining, Mottled, Pallor, Rubor. The surrounding wound skin color is noted with erythema which is circumferential. Periwound temperature was noted as No Abnormality. The periwound has tenderness on palpation. Assessment Active Problems ICD-10 E11.621 - Type 2 diabetes mellitus with foot ulcer N18.6 - End stage renal disease Z99.2 - Dependence on renal dialysis L97.312 - Non-pressure chronic ulcer of right ankle with fat layer exposed Procedures Wound #10 Huffstetler, Chery S. ([id number]) Pre-procedure diagnosis of Wound #10 is a Trauma, Other located on the Right,Lateral Malleolus . There was a Skin/Subcutaneous Tissue Debridement ([id number]) debridement with total area of 0.36 sq cm performed by [name], MD. with the following instrument(s): Curette to remove Viable and Non-Viable tissue/material including Fibrin/Slough and Subcutaneous after achieving pain control using Lidocaine 4% Topical Solution. A time out was conducted at 09:26, prior to the start of the procedure. A Minimum amount of bleeding was controlled with Pressure. The procedure was tolerated well. Post Debridement Measurements: 0.6cm length x 0.6cm width x 0.3cm depth; 0.085cm^3 volume. Character of Wound/Ulcer Post Debridement is improved. Post procedure Diagnosis Wound #10: Same as Pre-Procedure Plan Wound Cleansing: Wound #10 Right,Lateral Malleolus: Clean wound with Normal Saline. May Shower, gently pat wound dry prior to applying new dressing. Anesthetic: Wound #10 Right,Lateral Malleolus: Topical Lidocaine 4% cream applied to wound bed prior to debridement Skin Barriers/Peri-Wound Care: Wound #10 Right,Lateral Malleolus: Antifungal powder-Nystatin Primary Wound Dressing: Wound #10 Right,Lateral Malleolus: Santyl Ointment Secondary Dressing: Wound #10 Right,Lateral Malleolus: Dry Gauze Boardered Foam Dressing Dressing Change Frequency: Wound #10 Right,Lateral Malleolus: Change  dressing every day. Follow-up Appointments: Wound #10 Right,Lateral Malleolus: Return Appointment in 2 weeks. Edema Control: Wound #10 Right,Lateral Malleolus: Patient to wear own compression stockings Santillano, Iyania S. ([id number]) The wound continues to have significant amount of slough at the base and I have recommended: 1. Santyl ointment locally to be applied daily with a bordered foam. 2. Offloading has been discussed with her 3. adequate protein, vitamin A, vitamin C and zinc Electronic Signature(s) Signed: 12/05/2016 9:58:55 AM By: [name], [name] Entered By: [name] on 12/05/2016 09:58:55 Lacks, [name] ([id number]) -------------------------------------------------------------------------------- SuperBill Details [name]. Date of Service: 12/05/2016 Medical Record Number: [id number] Patient Account Number: [id number] Date of Birth/Sex: 1956-06-24 (61 y.o. Female) Treating RN: Montey Hora Primary Care Provider: [name] Other Clinician: Referring Provider: [name] Treating Provider/Extender: Frann Rider in Treatment: 8 Diagnosis Coding ICD-10 Codes Code Description E11.621 Type 2 diabetes mellitus with foot ulcer N18.6 End stage renal disease Z99.2 Dependence on renal dialysis L97.312 Non-pressure chronic ulcer of right ankle with fat layer exposed Facility Procedures CPT4 Code Description: [id number] 11042 - DEB SUBQ TISSUE 20 SQ CM/< ICD-10 Description Diagnosis E11.621 Type 2 diabetes mellitus with foot ulcer N18.6 End stage renal disease Z99.2 Dependence on renal dialysis L97.312 Non-pressure chronic ulcer of right  ankle with fat Modifier: layer expose  Quantity: 1 d Physician Procedures CPT4 Code Description: [id number] 11042 - WC PHYS SUBQ TISS 20 SQ CM ICD-10 Description Diagnosis E11.621 Type 2 diabetes mellitus with foot ulcer N18.6 End stage renal disease Z99.2 Dependence on renal dialysis L97.312 Non-pressure  chronic ulcer of right  ankle with fat Modifier: layer expose Quantity: 1 d Electronic Signature(s) Signed: 12/05/2016 10:05:10 AM By: [name], [name] Previous Signature: 12/05/2016 9:59:13 AM Version By: [name], [name] Entered By: [name] on 12/05/2016 10:05:10

## 2016-12-06 NOTE — Progress Notes (Signed)
[facility]OLLE[facility], [facility] ([id number]) Visit Report for 12/05/2016 Arrival Information Details Patient Name: [name], [name]. Date of Service: 12/05/2016 9:15 AM Medical Record Number: [id number] Patient Account Number: [id number] Date of Birth/Sex: 07/19/1955 (61 y.o. Female) Treating RN: [name] Primary Care [name]: [name] Other Clinician: Referring [name]: [name] Treating [name]/Extender: [name] in Treatment: 8 Visit Information History Since Last Visit Added or deleted any medications: No Patient Arrived: Wheel Chair Any new allergies or adverse reactions: No Arrival Time: 09:07 Had a fall or experienced change in No Accompanied By: self activities of daily living that may affect Transfer Assistance: None risk of falls: Patient Identification Verified: Yes Signs or symptoms of abuse/neglect since last No Secondary Verification Process Yes visito Completed: Hospitalized since last visit: No Patient Has Alerts: Yes Has Dressing in Place as Prescribed: Yes Patient Alerts: Patient on Blood Pain Present Now: No Thinner Electronic Signature(s) Signed: 12/05/2016 4:49:11 PM By: [name] Entered By: [name] on 12/05/2016 09:08:22 [name], [name] ([id number]) -------------------------------------------------------------------------------- Encounter Discharge Information Details Patient Name: [name], [name] Date of Service: 12/05/2016 9:15 AM Medical Record Number: [id number] Patient Account Number: [id number] Date of Birth/Sex: [birth year] (61 y.o. Female) Treating RN: [name] Primary Care Rosabella Edgin: [name] Other Clinician: Referring Avish Torry: [name] Treating [name]/Extender: [name] in Treatment: 8 Encounter Discharge Information Items Discharge Condition: Stable Ambulatory Status: Wheelchair Discharge Destination: Nursing Home Transportation: Private Auto Accompanied By: self Schedule Follow-up  Appointment: Yes Medication Reconciliation completed and provided to Patient/Care No [name]: Provided on Clinical Summary of Care: 12/05/2016 Form Type Recipient Paper Patient LD Electronic Signature(s) Signed: 12/05/2016 9:43:44 AM By: [name] Entered By: [name] on 12/05/2016 09:43:43 [name], [name] ([id number]) -------------------------------------------------------------------------------- Lower Extremity Assessment Details Patient Name: [name], [name] Date of Service: 12/05/2016 9:15 AM Medical Record Number: [id number] Patient Account Number: [id number] Date of Birth/Sex: 09/09/1955 (61 y.o. Female) Treating RN: [name] Primary Care Keiry Kowal: [name] Other Clinician: Referring Iman Orourke: [name] Treating Calaya Gildner/Extender: [name] in Treatment: 8 Vascular Assessment Pulses: Dorsalis Pedis Palpable: [Right:Yes] Posterior Tibial Extremity colors, hair growth, and conditions: Extremity Color: [Right:Normal] Hair Growth on Extremity: [Right:No] Temperature of Extremity: [Right:Warm] Capillary Refill: [Right:< 3 seconds] Electronic Signature(s) Signed: 12/05/2016 4:49:11 PM By: [name] Entered By: [name] on 12/05/2016 09:14:01 [name], [name] ([id number]) -------------------------------------------------------------------------------- Multi Wound Chart Details Patient Name: [name], [name] Date of Service: 12/05/2016 9:15 AM Medical Record Number: [id number] Patient Account Number: [id number] Date of Birth/Sex: 11/17/1955 (61 y.o. Female) Treating RN: [name] Primary Care Ova Gillentine: [name] Other Clinician: Referring Makinzee Durley: [name] Treating Urban Naval/Extender: [name] in Treatment: 8 Vital Signs Height(in): 61 Pulse(bpm): 82 Weight(lbs): 160 Blood Pressure 149/57 (mmHg): Body Mass Index(BMI): 30 Temperature(F): 98.4 Respiratory Rate [name] (breaths/min): Photos: [10:No Photos]  [N/A:N/A] Wound Location: [10:Right Malleolus - Lateral N/A] Wounding Event: [10:Trauma] [N/A:N/A] Primary Etiology: [10:Trauma, Other] [N/A:N/A] Comorbid History: [10:Glaucoma, Anemia, Coronary Artery Disease, Hypertension, Type II Diabetes, End Stage Renal Disease, Osteoarthritis, Neuropathy] [N/A:N/A] Date Acquired: [10:09/12/2016] [N/A:N/A] Weeks of Treatment: [10:8] [N/A:N/A] Wound Status: [10:Open] [N/A:N/A] Measurements L x W x D 0.6x0.6x0.2 [N/A:N/A] (cm) Area (cm) : [10:0.283] [N/A:N/A] Volume (cm) : [10:0.057] [N/A:N/A] % Reduction in Area: [10:0.00%] [N/A:N/A] % Reduction in Volume: 0.00% [N/A:N/A] Classification: [10:Full Thickness Without Exposed Support Structures] [N/A:N/A] HBO Classification: [10:Grade 1] [N/A:N/A] Exudate Amount: [10:Medium] [N/A:N/A] Exudate Type: [10:Purulent] [N/A:N/A] Exudate Color: [10:yellow, brown, green] [N/A:N/A] Wound Margin: [10:Flat and Intact] [N/A:N/A] Granulation Amount: [10:None Present (0%)] [N/A:N/A] Necrotic  Amount: [10:Large (67-100%)] [N/A:N/A] Necrotic Tissue: [10:Eschar, Adherent Slough N/A] Exposed Structures: Fat Layer (Subcutaneous N/A N/A Tissue) Exposed: Yes Fascia: No Tendon: No Muscle: No Joint: No Bone: No Epithelialization: None N/A N/A Debridement: Debridement ([id number]- N/A N/A 11047) Pre-procedure 09:26 N/A N/A Verification/Time Out Taken: Pain Control: Lidocaine 4% Topical N/A N/A Solution Tissue Debrided: Fibrin/Slough, N/A N/A Subcutaneous Level: Skin/Subcutaneous N/A N/A Tissue Debridement Area (sq 0.36 N/A N/A cm): Instrument: Curette N/A N/A Bleeding: Minimum N/A N/A Hemostasis Achieved: Pressure N/A N/A Debridement Treatment Procedure was tolerated N/A N/A Response: well Post Debridement 0.6x0.6x0.3 N/A N/A Measurements L x W x D (cm) Post Debridement 0.085 N/A N/A Volume: (cm) Periwound Skin Texture: Excoriation: No N/A N/A Induration: No Callus: No Crepitus: No Rash: No Scarring:  No Periwound Skin Maceration: No N/A N/A Moisture: Dry/Scaly: No Periwound Skin Color: Erythema: Yes N/A N/A Atrophie Blanche: No Cyanosis: No Ecchymosis: No Hemosiderin Staining: No Mottled: No Pallor: No Rubor: No Erythema Location: Circumferential N/A N/A Temperature: No Abnormality N/A N/A Yes N/A N/A [name], [name] ([id number]) Tenderness on Palpation: Wound Preparation: Ulcer Cleansing: N/A N/A Rinsed/Irrigated with Saline Topical Anesthetic Applied: Other: lidocaine 4% Procedures Performed: Debridement N/A N/A Treatment Notes Wound #10 (Right, Lateral Malleolus) 1. Cleansed with: Clean wound with Normal Saline 2. Anesthetic Topical Lidocaine 4% cream to wound bed prior to debridement 3. Peri-wound Care: Skin Prep 4. Dressing Applied: Santyl Ointment 5. Secondary Dressing Applied Bordered Foam Dressing Dry Gauze Electronic Signature(s) Signed: 12/05/2016 9:56:23 AM By: [name], [name] Entered By: [name] on 12/05/2016 09:56:22 Densmore, [name] ([id number]) -------------------------------------------------------------------------------- [place] Details Patient Name: [name], [name]. Date of Service: 12/05/2016 9:15 AM Medical Record Number: [id number] Patient Account Number: [id number] Date of Birth/Sex: [birth year][name]/1957 (61 y.o. Female) Treating RN: [name] Primary Care Wendy Hoback: [name] Other Clinician: Referring Kyion Gautier: [name] Treating Larkin Alfred/Extender: [name] in Treatment: 8 Active Inactive ` Abuse / Safety / Falls / Self Care Management Nursing Diagnoses: Potential for falls Goals: Patient will remain injury free Date Initiated: 10/10/2016 Target Resolution Date: 12/23/2016 Goal Status: Active Interventions: Assess fall risk on admission and as needed Notes: ` Nutrition Nursing Diagnoses: Potential for alteratiion in Nutrition/Potential for imbalanced  nutrition Goals: Patient/caregiver agrees to and verbalizes understanding of need to use nutritional supplements and/or vitamins as prescribed Date Initiated: 10/10/2016 Target Resolution Date: 10/21/2016 Goal Status: Active Interventions: Assess patient nutrition upon admission and as needed per policy Notes: ` Orientation to the Wound Care Program Nursing Diagnoses: Knowledge deficit related to the wound healing center program Memmer, [name] ([id number]) Goals: Patient/caregiver will verbalize understanding of the [facility] Date Initiated: 10/10/2016 Target Resolution Date: 12/23/2016 Goal Status: Active Interventions: Provide education on orientation to the wound center Notes: ` Wound/Skin Impairment Nursing Diagnoses: Impaired tissue integrity Goals: Patient/caregiver will verbalize understanding of skin care regimen Date Initiated: 10/10/2016 Target Resolution Date: 12/23/2016 Goal Status: Active Ulcer/skin breakdown will have a volume reduction of 30% by week 4 Date Initiated: 10/10/2016 Target Resolution Date: 12/23/2016 Goal Status: Active Ulcer/skin breakdown will have a volume reduction of 50% by week 8 Date Initiated: 10/10/2016 Target Resolution Date: 12/23/2016 Goal Status: Active Ulcer/skin breakdown will have a volume reduction of 80% by week 12 Date Initiated: 10/10/2016 Target Resolution Date: 12/23/2016 Goal Status: Active Ulcer/skin breakdown will heal within 14 weeks Date Initiated: 10/10/2016 Target Resolution Date: 12/23/2016 Goal Status: Active Interventions: Assess patient/caregiver ability to obtain necessary supplies Assess patient/caregiver ability to perform ulcer/skin care regimen  upon admission and as needed Assess ulceration(s) every visit Notes: Electronic Signature(s) Signed: 12/05/2016 4:49:11 PM By: [name] Entered By: [name] on 12/05/2016 09:14:10 [place]ce, [name]  ([id number]) -------------------------------------------------------------------------------- Pain Assessment Details [name]. Date of Service: 12/05/2016 9:15 AM Medical Record Number: [id number] Patient Account Number: [id number] Date of Birth/Sex: 18-Sep-1955 (61 y.o. Female) Treating RN: [name] Primary Care Vienne Corcoran: [name] Other Clinician: Referring Vangie Henthorn: [name] Treating Zophia Marrone/Extender: [name] in Treatment: 8 Active Problems Location of Pain Severity and Description of Pain Patient Has Paino No Site Locations Pain Management and Medication Current Pain Management: Notes Topical or injectable lidocaine is offered to patient for acute pain when surgical debridement is performed. If needed, Patient is instructed to use over the counter pain medication for the following 24-48 hours after debridement. Wound care MDs do not prescribed pain medications. Patient has chronic pain or uncontrolled pain. Patient has been instructed to make an appointment with their Primary Care Physician for pain management. Electronic Signature(s) Signed: 12/05/2016 4:49:11 PM By: [name] Entered By: [name] on 12/05/2016 09:08:32 Riles, [name] ([id number]) -------------------------------------------------------------------------------- Patient/Caregiver Education Details [name]. Date of Service: 12/05/2016 9:15 AM Medical Record Number: [id number] Patient Account Number: [id number] Date of Birth/Gender: 09-22-1955 (61 y.o. Female) Treating RN: [name] Primary Care Physician: [name] Other Clinician: Referring Physician: Nicky [name] Treating Physician/Extender: [name] in Treatment: 8 Education Assessment Education Provided To: Patient and Caregiver SNF nurses via written orders Education Topics Provided Wound/Skin Impairment: Handouts: Other: wound care as ordered Methods:  Printed Electronic Signature(s) Signed: 12/05/2016 4:49:11 PM By: [name] Entered By: [name] on 12/05/2016 09:27:40 Malveaux, [name] ([id number]) -------------------------------------------------------------------------------- Wound Assessment Details Patient Name: [name], [name] Date of Service: 12/05/2016 9:15 AM Medical Record Number: [id number] Patient Account Number: [id number] Date of Birth/Sex: [birth year] (61 y.o. Female) Treating RN: [name] Primary Care Darthula Desa: [name] Other Clinician: Referring Deborh Pense: [name] Treating Mabrey Howland/Extender: [name] in Treatment: 8 Wound Status Wound Number: 10 Primary Trauma, Other Etiology: Wound Location: Right Malleolus - Lateral Wound Open Wounding Event: Trauma Status: Date Acquired: 09/12/2016 Comorbid Glaucoma, Anemia, Coronary Artery Weeks Of Treatment: 8 History: Disease, Hypertension, Type II Clustered Wound: No Diabetes, End Stage Renal Disease, Osteoarthritis, Neuropathy Photos Photo Uploaded By: [name] on 12/05/2016 12:29:25 Wound Measurements Length: (cm) 0.6 Width: (cm) 0.6 Depth: (cm) 0.2 Area: (cm) 0.283 Volume: (cm) 0.057 % Reduction in Area: 0% % Reduction in Volume: 0% Epithelialization: None Tunneling: No Undermining: No Wound Description Full Thickness Without Classification: Exposed Support Structures Diabetic Severity Grade 1 (Wagner): Wound Margin: Flat and Intact Exudate Amount: Medium Exudate Type: Purulent Exudate Color: yellow, brown, green Foul Odor After Cleansing: No Slough/Fibrino Yes Wound Bed [name], [name] ([id number]) Granulation Amount: None Present (0%) Exposed Structure Necrotic Amount: Large (67-100%) Fascia Exposed: No Necrotic Quality: Eschar, Adherent Slough Fat Layer (Subcutaneous Tissue) Exposed: Yes Tendon Exposed: No Muscle Exposed: No Joint Exposed: No Bone Exposed: No Periwound Skin Texture Texture  Color No Abnormalities Noted: No No Abnormalities Noted: No Callus: No Atrophie Blanche: No Crepitus: No Cyanosis: No Excoriation: No Ecchymosis: No Induration: No Erythema: Yes Rash: No Erythema Location: Circumferential Scarring: No Hemosiderin Staining: No Mottled: No Moisture Pallor: No No Abnormalities Noted: No Rubor: No Dry / Scaly: No Maceration: No Temperature / Pain Temperature: No Abnormality Tenderness on Palpation: Yes Wound Preparation Ulcer Cleansing: Rinsed/Irrigated with Saline Topical Anesthetic Applied: Other: lidocaine 4%, Treatment Notes  Wound #10 (Right, Lateral Malleolus) 1. Cleansed with: Clean wound with Normal Saline 2. Anesthetic Topical Lidocaine 4% cream to wound bed prior to debridement 3. Peri-wound Care: Skin Prep 4. Dressing Applied: Santyl Ointment 5. Secondary Dressing Applied Bordered Foam Dressing Dry Gauze Electronic Signature(s) Signed: 12/05/2016 4:49:11 PM By: [name] Entered By: [name] on 12/05/2016 09:13:42 Tersigni, [name] ([id number]) [name], [name] ([id number]) -------------------------------------------------------------------------------- Vitals Details [name], [name]. Date of Service: 12/05/2016 9:15 AM Medical Record Number: [id number] Patient Account Number: [id number] Date of Birth/Sex: [birth year] (61 y.o. Female) Treating RN: [name] Primary Care Ashlon Lottman: [name] Other Clinician: Referring Dawayne Ohair: [name] Treating Jaken Fregia/Extender: [name] in Treatment: 8 Vital Signs Time Taken: 09:08 Temperature (F): 98.4 Height (in): 61 Pulse (bpm): 82 Weight (lbs): 160 Respiratory Rate (breaths/min): [name] Body Mass Index (BMI): 30.2 Blood Pressure (mmHg): 149/57 Reference Range: 80 - 120 mg / dl Electronic Signature(s) Signed: 12/05/2016 4:49:11 PM By: [name] Entered By: [name] on 12/05/2016 09:10:05

## 2016-12-14 ENCOUNTER — Ambulatory Visit
Admission: RE | Admit: 2016-12-14 | Discharge: 2016-12-14 | Disposition: A | Discharge status: Home / Self Care | Payer: Medicare Other | Source: Ambulatory Visit | Attending: Urology | Admitting: Urology

## 2016-12-14 DIAGNOSIS — N2889 Other specified disorders of kidney and ureter: Secondary | ICD-10-CM

## 2016-12-14 HISTORY — PX: IR RADIOLOGIST EVAL & MGMT: IMG5224

## 2016-12-14 NOTE — Consult Note (Signed)
Chief Complaint: Enlarging left lower pole renal mass compatible with renal cell carcinoma. Assess for cryoablation.  Referring Physician(s): Brandon,Ashley  History of Present Illness: [name] is a 61 y.o. female with multiple comorbidities including end-stage renal disease for which she was on prior peritoneal dialysis but is now on hemodialysis since 2011. She also has a history of prior coronary artery disease, CHF, previous coronary stents, COPD, and diabetes. She presents today to discuss cryoablation of an enlarging left renal mass compatible with renal cell carcinoma.  She had a previous image guided cryoablation in 2013 by a urologist at [facility]  Most recent CT with contrast confirms an enlarging exophytic left lower pole solid enhancing renal mass initially measured at 3.5 cm in greatest dimension. Lesion was remeasured today and is estimated at 5 cm in greatest dimension. The initial measurement was an under estimation. This lesion is adjacent to the prior left cryoablation defect.  She remains asymptomatic. No flank pain, abdominal pain or gross hematuria. She continues to avoid daily.  Past Medical History:  Diagnosis Date  . Atrial fibrillation ([place])   . CAD (coronary artery disease)   . CHF (congestive heart failure) ([place])   . CKD (chronic kidney disease)   . COPD (chronic obstructive pulmonary disease) ([place])   . Diabetes ([place])   . Diabetic peripheral neuropathy associated with type 2 diabetes mellitus ([place])   . Dialysis patient ([place])   . GERD (gastroesophageal reflux disease)   . HTN (hypertension)   . MI (myocardial infarction) ([place]) 05-17-2014  . Pancreatitis   . Peripheral autonomic neuropathy due to diabetes mellitus [facility])     Past Surgical History:  Procedure Laterality Date  . ABDOMINAL HYSTERECTOMY    . AV FISTULA PLACEMENT Left 12/25/2014   Procedure: ARTERIOVENOUS (AV) FISTULA CREATION;  Surgeon: Algernon Huxley, MD;   Location: [facility] ORS;  Service: Vascular;  Laterality: Left;  . BACK SURGERY    . CHOLECYSTECTOMY    . CORONARY ANGIOPLASTY     Cardiac stents  . EYE SURGERY     bilateral cataract extractions  . LEFT HEART CATHETERIZATION WITH CORONARY ANGIOGRAM N/A 05/20/2014   Procedure: LEFT HEART CATHETERIZATION WITH CORONARY ANGIOGRAM;  Surgeon: Clent Demark, MD;  Location: [place] CATH LAB;  Service: Cardiovascular;  Laterality: N/A;  . PERIPHERAL VASCULAR CATHETERIZATION N/A 02/12/2015   Procedure: A/V Shuntogram/Fistulagram;  Surgeon: Algernon Huxley, MD;  Location: [place] CV LAB;  Service: Cardiovascular;  Laterality: N/A;  . PERIPHERAL VASCULAR CATHETERIZATION N/A 02/12/2015   Procedure: A/V Shunt Intervention;  Surgeon: Algernon Huxley, MD;  Location: [place] CV LAB;  Service: Cardiovascular;  Laterality: N/A;  . PERIPHERAL VASCULAR CATHETERIZATION N/A 04/09/2015   Procedure: Dialysis/Perma Catheter Removal;  Surgeon: Algernon Huxley, MD;  Location: [place] CV LAB;  Service: Cardiovascular;  Laterality: N/A;  . PORTACATH PLACEMENT      Allergies: Adhesive [tape] and Doxycycline  Medications: Prior to Admission medications   Medication Sig Start Date End Date Taking? Authorizing Provider  albuterol (2.5 MG/3ML) 0.083% NEBU 3 mL, albuterol (5 MG/ML) 0.5% NEBU 0.5 mL Inhale into the lungs.    [provider]  amLODipine (NORVASC) 10 MG tablet amlodipine 10 mg tablet    [provider]  aspirin EC 81 MG tablet Take 81 mg by mouth daily.    [provider]  atorvastatin (LIPITOR) 20 MG tablet atorvastatin 20 mg tablet    [provider]  Calcium Carbonate-Vitamin D (CALCIUM 600+D) 600-200 MG-UNIT TABS Calcium 600    [provider]  clopidogrel (PLAVIX) 75 MG tablet clopidogrel 75 mg tablet    [provider]  docusate sodium (COLACE) 100 MG capsule docusate sodium 100 mg capsule  Take 1 capsule every day by oral route.    [provider]  furosemide (LASIX) 80 MG tablet furosemide 80 mg tablet    [provider]  hydrALAZINE (APRESOLINE) 50 MG tablet Take 50 mg by mouth 2 (two) times daily.    [provider]  Insulin Glargine (TOUJEO SOLOSTAR) 300 UNIT/ML SOPN Inject into the skin.    [provider]  insulin lispro (HUMALOG) 100 UNIT/ML injection Humalog U-100 Insulin 100 unit/mL subcutaneous solution    [provider]  isosorbide dinitrate (ISORDIL) 30 MG tablet Take 30 mg by mouth daily.     [provider]  labetalol (NORMODYNE) 300 MG tablet Take 300 mg by mouth 2 (two) times daily.    [provider]  loratadine (CLARITIN) 10 MG tablet Take 10 mg by mouth daily.    [provider]  losartan (COZAAR) 50 MG tablet Take 50 mg by mouth 2 (two) times daily.     [provider]  magnesium oxide (MAG-OX) 400 MG tablet Take 400 mg by mouth daily.    [provider]  metoCLOPramide (REGLAN) 5 MG tablet metoclopramide 5 mg tablet    [provider]  pantoprazole (PROTONIX) 40 MG tablet pantoprazole 40 mg tablet,delayed release    [provider]  predniSONE (DELTASONE) 10 MG tablet Take 10 mg by mouth daily with breakfast.    [provider]  pregabalin (LYRICA) 75 MG capsule Lyrica 75 mg capsule    [provider]  promethazine (PHENERGAN) 25 MG tablet Take 25 mg by mouth every 6 (six) hours as needed for nausea or vomiting.    [provider]  rOPINIRole (REQUIP) 0.25 MG tablet ropinirole 0.25 mg tablet    [provider]  tiotropium (SPIRIVA) 18 MCG inhalation capsule Place 18 mcg into inhaler and inhale daily.    [provider]  tiZANidine (ZANAFLEX) 4 MG tablet tizanidine 4 mg tablet    [provider]     Family History  Problem Relation Age of Onset  . Diabetes Mother   . Hypertension Mother   . Breast cancer Maternal Grandmother   . Breast cancer  Paternal Aunt     Social History   Social History  . Marital status: Married    Spouse name: N/A  . Number of children: N/A  . Years of education: N/A   Social History Main Topics  . Smoking status: Never Smoker  . Smokeless tobacco: Never Used  . Alcohol use No  . Drug use: No  . Sexual activity: No   Other Topics Concern  . Not on file   Social History Narrative  . No narrative on file    ECOG Status: 1 - Symptomatic but completely ambulatory  Review of Systems: A 12 point ROS discussed and pertinent positives are indicated in the HPI above.  All other systems are negative.  Review of Systems  Vital Signs: BP (!) 158/65   Pulse 87   Temp 98.8 F (37.1 C) (Oral)   Resp 15   Ht 5\' 1"  (1.549 m)   Wt 164 lb (74.4 kg)   SpO2 91% Comment: O2 at 2L/nasal cannual  BMI 30.99 kg/m   Physical Exam  Constitutional: She is oriented to person, place, and time. No distress.  Frail elderly female in a wheelchair.  Eyes: Conjunctivae are normal. No scleral icterus.  Cardiovascular: Normal rate and regular rhythm.  Exam reveals no friction rub.   No murmur heard. Pulmonary/Chest: Effort normal and breath sounds normal. No respiratory distress. She has no wheezes.  Abdominal: Soft. Bowel sounds are normal. She exhibits no distension and no mass. There is no tenderness.  Neurological: She is alert and oriented to person, place, and time.  Skin: Skin is dry. No rash noted.  Psychiatric: She has a normal mood and affect. Her behavior is normal.     Imaging: Ct Abd Wo & W Cm  Result Date: 11/30/2016 CLINICAL DATA:  Right flank pain for 5 days. Left renal cell carcinoma and cryo ablation. EXAM: CT ABDOMEN WITHOUT AND WITH CONTRAST TECHNIQUE: Multidetector CT imaging of the abdomen was performed following the standard protocol before and following the bolus administration of intravenous contrast. CONTRAST:  100 cc Isovue 370. COMPARISON:  09/16/2014. FINDINGS: Lower chest: Thin  rind of pleural fluid in the right hemithorax. Small left pleural effusion with collapse/ consolidation in the left lower lobe. Mild septal thickening in the right lower lobe. Heart is enlarged. Coronary artery calcification. No pericardial effusion. Hepatobiliary: Liver measures 21.4 cm and is somewhat heterogeneous. Cholecystectomy. No biliary ductal dilatation. Pancreas: Negative. Spleen: Negative. Adrenals/Urinary Tract: Adrenal glands are unremarkable. Low-attenuation lesions in the kidneys measure up to 13 mm on the right, too small to definitively characterize but statistically, cysts are most likely. A heterogeneous enhancing mass in the lower pole left kidney measures 3.4 cm, previously 2.4 cm. Cryoablation defect along the posterior interpolar left kidney, stable. Left kidney is non rotated and atrophic. Multiple renal vascular calcifications bilaterally. Stomach/Bowel: Stomach and visualized portions of the small bowel and colon are unremarkable. Vascular/Lymphatic: Atherosclerotic calcification of the arterial vasculature without abdominal aortic aneurysm. No pathologically enlarged lymph nodes. Other: Followup small periumbilical hernia contains fat. No free fluid. Mesenteries and peritoneum are otherwise unremarkable. Musculoskeletal: Degenerative changes in the spine. No worrisome lytic or sclerotic lesions. Old bilateral rib fractures. IMPRESSION: 1. Enlarging, heterogeneous and enhancing mass in the lower pole left kidney, worrisome for renal cell carcinoma. These results will be called to the ordering clinician or representative by the Radiologist Assistant, and communication documented in the PACS or zVision Dashboard. 2. Small left pleural effusion with collapse/consolidation in the left lower lobe. Pneumonia is not excluded. 3. Thin rind of pleural fluid in the right hemithorax. 4. Aortic atherosclerosis (ICD10-170.0). Coronary artery calcification. 5. Liver is enlarged and appears  heterogeneous, which can be seen with fat deposition. Electronically Signed   By: Lorin Picket M.D.   On: 11/30/2016 13:41    Labs:  CBC: No results for input(s): WBC, HGB, HCT, PLT in the last 8760 hours.  COAGS: No results for input(s): INR, APTT in the last 8760 hours.  BMP: No results for input(s): NA, K, CL, CO2, GLUCOSE, BUN, CALCIUM, CREATININE, GFRNONAA, GFRAA in the last 8760 hours.  Invalid input(s): CMP  LIVER FUNCTION TESTS: No results for input(s): BILITOT, AST, ALT, ALKPHOS, PROT, ALBUMIN in the last 8760 hours.  TUMOR MARKERS: No results for input(s): AFPTM, CEA, CA199, CHROMGRNA in the last 8760 hours.  Assessment and Plan:  5 cm left lower pole exophytic renal cell carcinoma demonstrates slight enlargement compared to prior CTs.  Treatment options were reviewed. Our discussion centered around surveillance versus cryoablation. Given her comorbidities and previous  abdominal surgery and peritoneal dialysis she is not an ideal surgical candidate according to urology.  After reviewing her imaging, image guided cryoablation will also be challenging. This likely will require image guided placement of 3 ablation probes triangulated within the lesion. Also, there is an adjacent left lower pole accessory renal artery originating off of the left common iliac artery which appears to be supplying the renal cell carcinoma.  To limit the risk of cryoablation vascular injury and bleeding, I plan to perform pre-ablation embolization of the accessory left lower pole renal artery the day before the procedure.  Prior to any intervention, she will need a cardiac and pulmonary clearance for anesthesia.  If she is cleared for anesthesia anticipate treatment in the next 4-8 weeks.  Thank you for this interesting consult.  I greatly enjoyed meeting JENNILYN ESTEVE and look forward to participating in their care.  A copy of this report was sent to the requesting provider on this  date.  Electronically Signed: Greggory Keen 12/14/2016, 4:13 PM   I spent a total of  40 Minutes   in face to face in clinical consultation, greater than 50% of which was counseling/coordinating care for this patient with a left renal cell carcinoma.

## 2016-12-15 ENCOUNTER — Encounter: Payer: Self-pay | Admitting: Radiology

## 2016-12-15 ENCOUNTER — Ambulatory Visit: Payer: Medicare Other

## 2016-12-16 ENCOUNTER — Ambulatory Visit: Payer: Medicare Other | Admitting: Surgery

## 2016-12-16 ENCOUNTER — Ambulatory Visit
Admission: RE | Admit: 2016-12-16 | Discharge: 2016-12-16 | Disposition: A | Discharge status: Home / Self Care | Payer: Medicare Other | Source: Ambulatory Visit | Attending: Internal Medicine | Admitting: Internal Medicine

## 2016-12-16 DIAGNOSIS — R918 Other nonspecific abnormal finding of lung field: Secondary | ICD-10-CM | POA: Insufficient documentation

## 2016-12-16 DIAGNOSIS — J90 Pleural effusion, not elsewhere classified: Secondary | ICD-10-CM | POA: Insufficient documentation

## 2016-12-16 DIAGNOSIS — I2510 Atherosclerotic heart disease of native coronary artery without angina pectoris: Secondary | ICD-10-CM | POA: Diagnosis not present

## 2016-12-16 DIAGNOSIS — J918 Pleural effusion in other conditions classified elsewhere: Secondary | ICD-10-CM

## 2016-12-16 HISTORY — DX: Disorder of kidney and ureter, unspecified: N28.9

## 2016-12-16 MED ORDER — IOPAMIDOL (ISOVUE-300) INJECTION 61%
75.0000 mL | Freq: Once | INTRAVENOUS | Status: AC | PRN
  Administered 2016-12-16: 75 mL via INTRAVENOUS

## 2016-12-20 ENCOUNTER — Encounter: Payer: Self-pay | Admitting: Radiology

## 2016-12-23 ENCOUNTER — Encounter: Payer: Medicare Other | Attending: Surgery | Admitting: Surgery

## 2016-12-23 DIAGNOSIS — E785 Hyperlipidemia, unspecified: Secondary | ICD-10-CM | POA: Insufficient documentation

## 2016-12-23 DIAGNOSIS — I509 Heart failure, unspecified: Secondary | ICD-10-CM | POA: Diagnosis not present

## 2016-12-23 DIAGNOSIS — N186 End stage renal disease: Secondary | ICD-10-CM | POA: Insufficient documentation

## 2016-12-23 DIAGNOSIS — I132 Hypertensive heart and chronic kidney disease with heart failure and with stage 5 chronic kidney disease, or end stage renal disease: Secondary | ICD-10-CM | POA: Insufficient documentation

## 2016-12-23 DIAGNOSIS — E1142 Type 2 diabetes mellitus with diabetic polyneuropathy: Secondary | ICD-10-CM | POA: Diagnosis not present

## 2016-12-23 DIAGNOSIS — L97312 Non-pressure chronic ulcer of right ankle with fat layer exposed: Secondary | ICD-10-CM | POA: Diagnosis not present

## 2016-12-23 DIAGNOSIS — Z992 Dependence on renal dialysis: Secondary | ICD-10-CM | POA: Diagnosis not present

## 2016-12-23 DIAGNOSIS — E1122 Type 2 diabetes mellitus with diabetic chronic kidney disease: Secondary | ICD-10-CM | POA: Insufficient documentation

## 2016-12-23 DIAGNOSIS — D649 Anemia, unspecified: Secondary | ICD-10-CM | POA: Insufficient documentation

## 2016-12-23 DIAGNOSIS — I4891 Unspecified atrial fibrillation: Secondary | ICD-10-CM | POA: Insufficient documentation

## 2016-12-23 DIAGNOSIS — I2510 Atherosclerotic heart disease of native coronary artery without angina pectoris: Secondary | ICD-10-CM | POA: Insufficient documentation

## 2016-12-23 DIAGNOSIS — E11621 Type 2 diabetes mellitus with foot ulcer: Secondary | ICD-10-CM | POA: Insufficient documentation

## 2016-12-24 NOTE — Progress Notes (Signed)
MAREA, REASNER ([id number]) Visit Report for 12/23/2016 Arrival Information Details [name], [name]. Date of Service: 12/23/2016 9:15 AM Medical Record Number: [id number] Patient Account Number: [id number] Date of Birth/Sex: Jul 19, 1955 (61 y.o. Female) Treating RN: Montey Hora Primary Care Malcolm Quast: [name] Other Clinician: Referring Sarissa Dern: [name] Treating Shariyah Eland/Extender: Frann Rider in Treatment: 10 Visit Information History Since Last Visit Added or deleted any medications: No Patient Arrived: Wheel Chair Any new allergies or adverse reactions: No Arrival Time: 09:27 Had a fall or experienced change in No Accompanied By: self activities of daily living that may affect Transfer Assistance: None risk of falls: Patient Identification Verified: Yes Signs or symptoms of abuse/neglect since last No Secondary Verification Process Yes visito Completed: Hospitalized since last visit: No Patient Has Alerts: Yes Has Dressing in Place as Prescribed: Yes Patient Alerts: Patient on Blood Pain Present Now: No Thinner Electronic Signature(s) Signed: 12/23/2016 11:31:51 AM By: Montey Hora Entered By: Montey Hora on 12/23/2016 09:30:10 Burling, [name] ([id number]) -------------------------------------------------------------------------------- Encounter Discharge Information Details [name]. Date of Service: 12/23/2016 9:15 AM Medical Record Number: [id number] Patient Account Number: [id number] Date of Birth/Sex: [birth year] (61 y.o. Female) Treating RN: Montey Hora Primary Care Roselynne Lortz: [name] Other Clinician: Referring Elynore Dolinski: [name] Treating Lashana Spang/Extender: Frann Rider in Treatment: 10 Encounter Discharge Information Items Discharge Pain Level: 0 Discharge Condition: Stable Ambulatory Status: Wheelchair Nursing Discharge Destination: Home Transportation: Private Auto Accompanied By:  self Schedule Follow-up Appointment: Yes Medication Reconciliation completed No and provided to Patient/Care Abdulahi Schor: Clinical Summary of Care: Electronic Signature(s) Signed: 12/23/2016 10:48:43 AM By: Montey Hora Entered By: Montey Hora on 12/23/2016 10:48:43 Lukach, [name] ([id number]) -------------------------------------------------------------------------------- Lower Extremity Assessment Details Patient Name: DQQIW, [name] Date of Service: 12/23/2016 9:15 AM Medical Record Number: [id number] Patient Account Number: [id number] Date of Birth/Sex: [birth year] (61 y.o. Female) Treating RN: Montey Hora Primary Care Felice Deem: [name] Other Clinician: Referring Gearldine Looney: [name] Treating Kaiser Belluomini/Extender: Frann Rider in Treatment: 10 Vascular Assessment Pulses: Dorsalis Pedis Palpable: [Right:Yes] Posterior Tibial Extremity colors, hair growth, and conditions: Extremity Color: [Right:Normal] Hair Growth on Extremity: [Right:No] Temperature of Extremity: [Right:Warm] Capillary Refill: [Right:< 3 seconds] Electronic Signature(s) Signed: 12/23/2016 11:31:51 AM By: Montey Hora Entered By: Montey Hora on 12/23/2016 09:40:18 [name], Joda S. ([id number]) -------------------------------------------------------------------------------- Multi Wound Chart Details Patient Name: [name], [name] Date of Service: 12/23/2016 9:15 AM Medical Record Number: [id number] Patient Account Number: [id number] Date of Birth/Sex: Jul 08, 1956 (61 y.o. Female) Treating RN: Montey Hora Primary Care Breylen Agyeman: [name] Other Clinician: Referring Tyauna Lacaze: [name] Treating Korby Ratay/Extender: Frann Rider in Treatment: 10 Vital Signs Height(in): 61 Pulse(bpm): 82 Weight(lbs): 160 Blood Pressure 145/54 (mmHg): Body Mass Index(BMI): 30 Temperature(F): 98.3 Respiratory Rate 18 (breaths/min): Photos: [10:No Photos] [N/A:N/A] Wound Location:  [10:Right Malleolus - Lateral N/A] Wounding Event: [10:Trauma] [N/A:N/A] Primary Etiology: [10:Trauma, Other] [N/A:N/A] Comorbid History: [10:Glaucoma, Anemia, Coronary Artery Disease, Hypertension, Type II Diabetes, End Stage Renal Disease, Osteoarthritis, Neuropathy] [N/A:N/A] Date Acquired: [10:09/12/2016] [N/A:N/A] Weeks of Treatment: [10:10] [N/A:N/A] Wound Status: [10:Open] [N/A:N/A] Measurements L x W x D 0.4x0.4x0.2 [N/A:N/A] (cm) Area (cm) : [10:0.126] [N/A:N/A] Volume (cm) : [10:0.025] [N/A:N/A] % Reduction in Area: [10:55.50%] [N/A:N/A] % Reduction in Volume: 56.10% [N/A:N/A] Classification: [10:Full Thickness Without Exposed Support Structures] [N/A:N/A] HBO Classification: [10:Grade 1] [N/A:N/A] Exudate Amount: [10:Medium] [N/A:N/A] Exudate Type: [10:Purulent] [N/A:N/A] Exudate Color: [10:yellow, brown, green] [N/A:N/A] Wound Margin: [10:Flat and Intact] [N/A:N/A] Granulation Amount: [10:Medium (34-66%)] [N/A:N/A] Granulation Quality: [10:Pink] [N/A:N/A] Necrotic Amount: [10:Medium (  34-66%)] [N/A:N/A] Necrotic Tissue: Eschar, Adherent Slough N/A N/A Exposed Structures: Fat Layer (Subcutaneous N/A N/A Tissue) Exposed: Yes Fascia: No Tendon: No Muscle: No Joint: No Bone: No Epithelialization: None N/A N/A Debridement: Debridement (60109- N/A N/A 11047) Pre-procedure 09:40 N/A N/A Verification/Time Out Taken: Pain Control: Lidocaine 4% Topical N/A N/A Solution Tissue Debrided: Fibrin/Slough, N/A N/A Subcutaneous Level: Skin/Subcutaneous N/A N/A Tissue Debridement Area (sq 0.16 N/A N/A cm): Instrument: Forceps N/A N/A Bleeding: Minimum N/A N/A Hemostasis Achieved: Pressure N/A N/A Procedural Pain: 0 N/A N/A Post Procedural Pain: 0 N/A N/A Debridement Treatment Procedure was tolerated N/A N/A Response: well Post Debridement 0.4x0.4x0.2 N/A N/A Measurements L x W x D (cm) Post Debridement 0.025 N/A N/A Volume: (cm) Periwound Skin Texture:  Excoriation: No N/A N/A Induration: No Callus: No Crepitus: No Rash: No Scarring: No Periwound Skin Maceration: No N/A N/A Moisture: Dry/Scaly: No Periwound Skin Color: Erythema: Yes N/A N/A Atrophie Blanche: No Cyanosis: No Ecchymosis: No Hemosiderin Staining: No Mottled: No Pallor: No Rubor: No Crosby, Roosevelt S. ([id number]) Erythema Location: Circumferential N/A N/A Temperature: No Abnormality N/A N/A Tenderness on Yes N/A N/A Palpation: Wound Preparation: Ulcer Cleansing: N/A N/A Rinsed/Irrigated with Saline Topical Anesthetic Applied: Other: lidocaine 4% Procedures Performed: Debridement N/A N/A Treatment Notes Electronic Signature(s) Signed: 12/23/2016 9:45:33 AM By: [name], [name] Entered By: [name] on 12/23/2016 09:45:33 Jamerson, [name] ([id number]) -------------------------------------------------------------------------------- Miranda Details [name]. Date of Service: 12/23/2016 9:15 AM Medical Record Number: [id number] Patient Account Number: [id number] Date of Birth/Sex: 1956/01/28 (61 y.o. Female) Treating RN: Montey Hora Primary Care Hazelle Woollard: [name] Other Clinician: Referring Elwyn Klosinski: [name] Treating Kimon Loewen/Extender: Frann Rider in Treatment: 10 Active Inactive ` Abuse / Safety / Falls / Self Care Management Nursing Diagnoses: Potential for falls Goals: Patient will remain injury free Date Initiated: 10/10/2016 Target Resolution Date: 12/23/2016 Goal Status: Active Interventions: Assess fall risk on admission and as needed Notes: ` Nutrition Nursing Diagnoses: Potential for alteratiion in Nutrition/Potential for imbalanced nutrition Goals: Patient/caregiver agrees to and verbalizes understanding of need to use nutritional supplements and/or vitamins as prescribed Date Initiated: 10/10/2016 Target Resolution Date: 10/21/2016 Goal Status:  Active Interventions: Assess patient nutrition upon admission and as needed per policy Notes: ` Orientation to the Wound Care Program Nursing Diagnoses: Knowledge deficit related to the wound healing center program Vowels, DAMONIQUE BRUNELLE ([id number]) Goals: Patient/caregiver will verbalize understanding of the [place] Date Initiated: 10/10/2016 Target Resolution Date: 12/23/2016 Goal Status: Active Interventions: Provide education on orientation to the wound center Notes: ` Wound/Skin Impairment Nursing Diagnoses: Impaired tissue integrity Goals: Patient/caregiver will verbalize understanding of skin care regimen Date Initiated: 10/10/2016 Target Resolution Date: 12/23/2016 Goal Status: Active Ulcer/skin breakdown will have a volume reduction of 30% by week 4 Date Initiated: 10/10/2016 Target Resolution Date: 12/23/2016 Goal Status: Active Ulcer/skin breakdown will have a volume reduction of 50% by week 8 Date Initiated: 10/10/2016 Target Resolution Date: 12/23/2016 Goal Status: Active Ulcer/skin breakdown will have a volume reduction of 80% by week 12 Date Initiated: 10/10/2016 Target Resolution Date: 12/23/2016 Goal Status: Active Ulcer/skin breakdown will heal within 14 weeks Date Initiated: 10/10/2016 Target Resolution Date: 12/23/2016 Goal Status: Active Interventions: Assess patient/caregiver ability to obtain necessary supplies Assess patient/caregiver ability to perform ulcer/skin care regimen upon admission and as needed Assess ulceration(s) every visit Notes: Electronic Signature(s) Signed: 12/23/2016 11:31:51 AM By: Montey Hora Entered By: Montey Hora on 12/23/2016 09:40:24 Speak, Anali S. ([id number]) -------------------------------------------------------------------------------- Pain Assessment Details  Patient Name: MARGUERETTE, SHELLER. Date of Service: 12/23/2016 9:15 AM Medical Record Number: [id number] Patient Account Number: [id number] Date of  Birth/Sex: [birth year] (61 y.o. Female) Treating RN: Montey Hora Primary Care Kennie Snedden: [name] Other Clinician: Referring Zakaiya Lares: [name] Treating Rice Walsh/Extender: Frann Rider in Treatment: 10 Active Problems Location of Pain Severity and Description of Pain Patient Has Paino No Site Locations Pain Management and Medication Current Pain Management: Notes Topical or injectable lidocaine is offered to patient for acute pain when surgical debridement is performed. If needed, Patient is instructed to use over the counter pain medication for the following 24-48 hours after debridement. Wound care MDs do not prescribed pain medications. Patient has chronic pain or uncontrolled pain. Patient has been instructed to make an appointment with their Primary Care Physician for pain management. Electronic Signature(s) Signed: 12/23/2016 11:31:51 AM By: Montey Hora Entered By: Montey Hora on 12/23/2016 09:30:33 Goughnour, [name] ([id number]) -------------------------------------------------------------------------------- Patient/Caregiver Education Details [name]. Date of Service: 12/23/2016 9:15 AM Medical Record Number: [id number] Patient Account Number: [id number] Date of Birth/Gender: 1955/10/13 (61 y.o. Female) Treating RN: Montey Hora Primary Care Physician: [name] Other Clinician: Referring Physician: [name] Treating Physician/Extender: Frann Rider in Treatment: 10 Education Assessment Education Provided To: Caregiver SNF nurses via written orders Education Topics Provided Wound/Skin Impairment: Handouts: Other: wound care as ordered Methods: Printed Electronic Signature(s) Signed: 12/23/2016 11:31:51 AM By: Montey Hora Entered By: Montey Hora on 12/23/2016 10:49:23 Mcdiarmid, Blaire Chauncey Cruel ([id number]) -------------------------------------------------------------------------------- Wound Assessment  Details Patient Name: [name], Kaedyn S. Date of Service: 12/23/2016 9:15 AM Medical Record Number: [id number] Patient Account Number: [id number] Date of Birth/Sex: 12-05-1955 (61 y.o. Female) Treating RN: Montey Hora Primary Care Cisco Kindt: [name] Other Clinician: Referring Mazzie Brodrick: [name] Treating Reather Steller/Extender: Frann Rider in Treatment: 10 Wound Status Wound Number: 10 Primary Trauma, Other Etiology: Wound Location: Right Malleolus - Lateral Wound Open Wounding Event: Trauma Status: Date Acquired: 09/12/2016 Comorbid Glaucoma, Anemia, Coronary Artery Weeks Of Treatment: 10 History: Disease, Hypertension, Type II Clustered Wound: No Diabetes, End Stage Renal Disease, Osteoarthritis, Neuropathy Photos Photo Uploaded By: Montey Hora on 12/23/2016 10:41:58 Wound Measurements Length: (cm) 0.4 Width: (cm) 0.4 Depth: (cm) 0.2 Area: (cm) 0.126 Volume: (cm) 0.025 % Reduction in Area: 55.5% % Reduction in Volume: 56.1% Epithelialization: None Tunneling: No Undermining: No Wound Description Full Thickness Without Classification: Exposed Support Structures Diabetic Severity Grade 1 (Wagner): Wound Margin: Flat and Intact Exudate Amount: Medium Exudate Type: Purulent Exudate Color: yellow, brown, green Foul Odor After Cleansing: No Slough/Fibrino Yes Wound Bed Girten, Dulse S. ([id number]) Granulation Amount: Medium (34-66%) Exposed Structure Granulation Quality: Pink Fascia Exposed: No Necrotic Amount: Medium (34-66%) Fat Layer (Subcutaneous Tissue) Exposed: Yes Necrotic Quality: Eschar, Adherent Slough Tendon Exposed: No Muscle Exposed: No Joint Exposed: No Bone Exposed: No Periwound Skin Texture Texture Color No Abnormalities Noted: No No Abnormalities Noted: No Callus: No Atrophie Blanche: No Crepitus: No Cyanosis: No Excoriation: No Ecchymosis: No Induration: No Erythema: Yes Rash: No Erythema Location:  Circumferential Scarring: No Hemosiderin Staining: No Mottled: No Moisture Pallor: No No Abnormalities Noted: No Rubor: No Dry / Scaly: No Maceration: No Temperature / Pain Temperature: No Abnormality Tenderness on Palpation: Yes Wound Preparation Ulcer Cleansing: Rinsed/Irrigated with Saline Topical Anesthetic Applied: Other: lidocaine 4%, Treatment Notes Wound #10 (Right, Lateral Malleolus) 1. Cleansed with: Clean wound with Normal Saline 2. Anesthetic Topical Lidocaine 4% cream to wound bed prior to debridement 4. Dressing Applied: Santyl Ointment 5. Secondary Dressing Applied  Bordered Foam Dressing Dry Gauze Electronic Signature(s) Signed: 12/23/2016 11:31:51 AM By: Montey Hora Entered By: Montey Hora on 12/23/2016 09:39:28 Nasca, [name] ([id number]) -------------------------------------------------------------------------------- Vitals Details [name]. Date of Service: 12/23/2016 9:15 AM Medical Record Number: [id number] Patient Account Number: [id number] Date of Birth/Sex: 31-Oct-1955 (61 y.o. Female) Treating RN: Montey Hora Primary Care Jabre Heo: [name] Other Clinician: Referring Felipe Cabell: [name] Treating Betsi Crespi/Extender: Frann Rider in Treatment: 10 Vital Signs Time Taken: 09:30 Temperature (F): 98.3 Height (in): 61 Pulse (bpm): 82 Weight (lbs): 160 Respiratory Rate (breaths/min): 18 Body Mass Index (BMI): 30.2 Blood Pressure (mmHg): 145/54 Reference Range: 80 - 120 mg / dl Electronic Signature(s) Signed: 12/23/2016 11:31:51 AM By: Montey Hora Entered By: Montey Hora on 12/23/2016 09:31:48

## 2016-12-25 NOTE — Progress Notes (Signed)
AUSTIN, PONGRATZ ([id number]) Visit Report for 12/23/2016 Chief Complaint Document Details [name], YANEY. Date of Service: 12/23/2016 9:15 AM Medical Record Number: [id number] Patient Account Number: [id number] Date of Birth/Sex: 04-Aug-1955 (61 y.o. Female) Treating RN: Montey Hora Primary Care Provider: [name] Other Clinician: Referring Provider: [name] Treating Provider/Extender: Frann Rider in Treatment: 10 Information Obtained from: Patient Chief Complaint Patients presents for treatment of an open diabetic ulcer right lateral ankle which she's had for about a month Electronic Signature(s) Signed: 12/23/2016 9:45:52 AM By: [name], [name] Entered By: [name] on 12/23/2016 09:45:52 Macek, Breyanna S. ([id number]) -------------------------------------------------------------------------------- Debridement Details [name]. Date of Service: 12/23/2016 9:15 AM Medical Record Number: [id number] Patient Account Number: [id number] Date of Birth/Sex: April 24, 1956 (61 y.o. Female) Treating RN: Montey Hora Primary Care Provider: [name] Other Clinician: Referring Provider: [name] Treating Provider/Extender: Frann Rider in Treatment: 10 Debridement Performed for Wound #10 Right,Lateral Malleolus Assessment: Performed By: Physician [name], MD Debridement: Debridement Pre-procedure Verification/Time Out Yes - 09:40 Taken: Start Time: 09:40 Pain Control: Lidocaine 4% Topical Solution Level: Skin/Subcutaneous Tissue Total Area Debrided (L x 0.4 (cm) x 0.4 (cm) = 0.16 (cm) W): Tissue and other Viable, Non-Viable, Fibrin/Slough, Subcutaneous material debrided: Instrument: Forceps Bleeding: Minimum Hemostasis Achieved: Pressure End Time: 09:42 Procedural Pain: 0 Post Procedural Pain: 0 Response to Treatment: Procedure was tolerated well Post Debridement Measurements of Total Wound Length:  (cm) 0.4 Width: (cm) 0.4 Depth: (cm) 0.2 Volume: (cm) 0.025 Character of Wound/Ulcer Post Improved Debridement: Post Procedure Diagnosis Same as Pre-procedure Electronic Signature(s) Signed: 12/23/2016 9:45:44 AM By: [name], [name] Signed: 12/23/2016 11:31:51 AM By: Montey Hora Entered By: [name] on 12/23/2016 09:45:44 Veney, Inas S. ([id number]) -------------------------------------------------------------------------------- HPI Details [name], [name] Date of Service: 12/23/2016 9:15 AM Medical Record Number: [id number] Patient Account Number: [id number] Date of Birth/Sex: 06-04-1956 (61 y.o. Female) Treating RN: Montey Hora Primary Care Provider: [name] Other Clinician: Referring Provider: [name] Treating Provider/Extender: Frann Rider in Treatment: 10 History of Present Illness Location: right lateral ankle Quality: Patient reports No Pain. Severity: Patient states wound (s) are getting better. Duration: Patient has had the wound for > 1 months prior to seeking treatment at the wound center Context: The wound appeared gradually over time, and she thinks she might have hit it with a walker Modifying Factors: Patient is currently on renal dialysis and receives treatments 3 times weekly HPI Description: 61 year old patient known to our practice from several previous visits for various problems now comes back with an ulcerated area on her right lateral malleolus which she's had for about a month and she thinks she may have hit it with her walker. No other problems and no x-ray of this has been taken. She has a past medical history of atrial fibrillation, carotid artery disease, CHF, chronic kidney disease on hemodialysis with a functioning AV fistula seen by Dr. Corene Cornea dew, diabetes mellitus with peripheral neuropathy, pancreatitis and is status post abdominal hysterectomy, AV fistula placement in June 2016, back surgery,  cholecystectomy, coronary angioplasty,several surgeries for AV fistula, and dialysis catheters. She has never been a smoker. 10/17/2016 -- had a right ankle x-ray which showed no acute pathology of the right ankle. Arterial vascular calcifications are noted and there is generalized osteopenia and demineralization 11/11/16 Patient appears to be doing fairly well with regard to her right ankle wound. She has been tolerating the dressing changes with the Santyl that  does seem to have loosened up the slough a bit. She has also been tolerating the debridement's when they have been performed. She tells me that she does tend to lay on her right side and this seems to be aggravating the issue. She does not have a Prevalon boot for use at this facility. Fortunately she is not having significant pain. ===== Old notes: 61 year old patient who is known to be a diabetic and has end-stage renal disease has had several comorbidities including coronary artery disease, hypertension, hyperlipidemia, pancreatitis, anemia, previous history of hysterectomy, cholecystectomy, left-sided salivary gland excision, bilateral cataract surgery,Peritoneal dialysis catheter, hemodialysis catheter. the area on the back has also been caused by instant pressure she used to sleep on a recliner all day and has significant kyphoscoliosis. As far as the wound on her right lower extremity she's not sure how this blister occurred but she thought it has been there for about 2 weeks. No recent blood investigations available and no recent hemoglobin A1c. 12/30/2014 -- she is an assisted living facility but I believe the nurses that have not followed instructions as she had some cream applied on her back and there was a different dressing. Last week she's had a AV Decou, Mio S. ([id number]) fistula placed on her left forearm. 01/13/2015 -- she has had some localized infection at the port site and she's been on doxycycline for  this. 02/05/2015 - he has developed a small blister on her right lower extremity. 02/10/2015 -- she has developed another small blister on her right anterior chest wall in the area where she's had tape for her dialysis access. this may just be injury caused by a tape burn. She had a dermatology opinion and they have taken a biopsy of her skin. She also had a left brachial AV fistula placed this week. ========= Electronic Signature(s) Signed: 12/23/2016 9:45:56 AM By: [name], [name] Entered By: [name] on 12/23/2016 09:45:56 Adkison, [name] ([id number]) -------------------------------------------------------------------------------- Physical Exam Details [name], 73. Date of Service: 12/23/2016 9:15 AM Medical Record Number: [id number] Patient Account Number: [id number] Date of Birth/Sex: 19-Sep-1955 (61 y.o. Female) Treating RN: Montey Hora Primary Care Provider: [name] Other Clinician: Referring Provider: [name] Treating Provider/Extender: Frann Rider in Treatment: 10 Constitutional . Pulse regular. Respirations normal and unlabored. Afebrile. . Eyes Nonicteric. Reactive to light. Ears, Nose, Mouth, and Throat Lips, teeth, and gums WNL.Marland Kitchen Moist mucosa without lesions. Neck supple and nontender. No palpable supraclavicular or cervical adenopathy. Normal sized without goiter. Respiratory WNL. No retractions.. Cardiovascular Pedal Pulses WNL. No clubbing, cyanosis or edema. Lymphatic No adneopathy. No adenopathy. No adenopathy. Musculoskeletal Adexa without tenderness or enlargement.. Digits and nails w/o clubbing, cyanosis, infection, petechiae, ischemia, or inflammatory conditions.. Integumentary (Hair, Skin) No suspicious lesions. No crepitus or fluctuance. No peri-wound warmth or erythema. No masses.Marland Kitchen Psychiatric Judgement and insight Intact.. No evidence of depression, anxiety, or agitation.. Notes the right ankle wound  looks very good and after sharply debriding the base of the wound there is some healthy granulation tissue. We will continue with Santyl for now and move over to [place] soon. Electronic Signature(s) Signed: 12/23/2016 9:47:04 AM By: [name], [name] Entered By: [name] on 12/23/2016 09:47:03 Infinger, [name] ([id number]) -------------------------------------------------------------------------------- Physician Orders Details [name]. Date of Service: 12/23/2016 9:15 AM Medical Record Number: [id number] Patient Account Number: [id number] Date of Birth/Sex: 06-01-1956 (61 y.o. Female) Treating RN: Montey Hora Primary Care Provider: [name] Other Clinician: Referring Provider:  EASON, ERNEST Treating Provider/Extender: Frann Rider in Treatment: 10 Verbal / Phone Orders: No Diagnosis Coding Wound Cleansing Wound #10 Right,Lateral Malleolus o Clean wound with Normal Saline. o May Shower, gently pat wound dry prior to applying new dressing. Anesthetic Wound #10 Right,Lateral Malleolus o Topical Lidocaine 4% cream applied to wound bed prior to debridement Primary Wound Dressing Wound #10 Right,Lateral Malleolus o Santyl Ointment Secondary Dressing Wound #10 Right,Lateral Malleolus o Dry Gauze o Boardered Foam Dressing Dressing Change Frequency Wound #10 Right,Lateral Malleolus o Change dressing every day. Follow-up Appointments Wound #10 Right,Lateral Malleolus o Return Appointment in 2 weeks. Edema Control Wound #10 Right,Lateral Malleolus o Patient to wear own compression stockings Notes Please apply lotion to scar on patient's back under bandage when bandage is changed. Electronic Signature(s) BRETTANY, SYDNEY ([id number]) Signed: 12/23/2016 11:31:51 AM By: Montey Hora Signed: 12/23/2016 4:12:58 PM By: [name], [name] Entered By: Montey Hora on 12/23/2016 09:47:44 Markwood, [name]  ([id number]) -------------------------------------------------------------------------------- Problem List Details [name], [name]. Date of Service: 12/23/2016 9:15 AM Medical Record Number: [id number] Patient Account Number: [id number] Date of Birth/Sex: Jan 30, 1956 (61 y.o. Female) Treating RN: Montey Hora Primary Care Provider: [name] Other Clinician: Referring Provider: [name] Treating Provider/Extender: Frann Rider in Treatment: 10 Active Problems ICD-10 Encounter Code Description Active Date Diagnosis E11.621 Type 2 diabetes mellitus with foot ulcer 10/10/2016 Yes N18.6 End stage renal disease 10/10/2016 Yes Z99.2 Dependence on renal dialysis 10/10/2016 Yes L97.312 Non-pressure chronic ulcer of right ankle with fat layer 10/10/2016 Yes exposed Inactive Problems Resolved Problems Electronic Signature(s) Signed: 12/23/2016 9:45:27 AM By: [name], [name] Entered By: [name] on 12/23/2016 09:45:27 Varden, Ziyah S. ([id number]) -------------------------------------------------------------------------------- Progress Note Details Patient Name: Tibbetts, [name] Date of Service: 12/23/2016 9:15 AM Medical Record Number: [id number] Patient Account Number: [id number] Date of Birth/Sex: [birth year] (61 y.o. Female) Treating RN: Montey Hora Primary Care Provider: [name] Other Clinician: Referring Provider: [name] Treating Provider/Extender: Frann Rider in Treatment: 10 Subjective Chief Complaint Information obtained from Patient Patients presents for treatment of an open diabetic ulcer right lateral ankle which she's had for about a month History of Present Illness (HPI) The following HPI elements were documented for the patient's wound: Location: right lateral ankle Quality: Patient reports No Pain. Severity: Patient states wound (s) are getting better. Duration: Patient has had the wound for > 1 months prior to  seeking treatment at the wound center Context: The wound appeared gradually over time, and she thinks she might have hit it with a walker Modifying Factors: Patient is currently on renal dialysis and receives treatments 3 times weekly 61 year old patient known to our practice from several previous visits for various problems now comes back with an ulcerated area on her right lateral malleolus which she's had for about a month and she thinks she may have hit it with her walker. No other problems and no x-ray of this has been taken. She has a past medical history of atrial fibrillation, carotid artery disease, CHF, chronic kidney disease on hemodialysis with a functioning AV fistula seen by Dr. Corene Cornea dew, diabetes mellitus with peripheral neuropathy, pancreatitis and is status post abdominal hysterectomy, AV fistula placement in June 2016, back surgery, cholecystectomy, coronary angioplasty,several surgeries for AV fistula, and dialysis catheters. She has never been a smoker. 10/17/2016 -- had a right ankle x-ray which showed no acute pathology of the right ankle. Arterial vascular calcifications are noted and there is generalized osteopenia and demineralization  11/11/16 Patient appears to be doing fairly well with regard to her right ankle wound. She has been tolerating the dressing changes with the Santyl that does seem to have loosened up the slough a bit. She has also been tolerating the debridement's when they have been performed. She tells me that she does tend to lay on her right side and this seems to be aggravating the issue. She does not have a Prevalon boot for use at this facility. Fortunately she is not having significant pain. ===== Old notes: 61 year old patient who is known to be a diabetic and has end-stage renal disease has had several comorbidities including coronary artery disease, hypertension, hyperlipidemia, pancreatitis, anemia, previous history of  hysterectomy, cholecystectomy, left-sided salivary gland excision, bilateral cataract surgery,Peritoneal dialysis catheter, [place], Milani S. ([id number]) hemodialysis catheter. the area on the back has also been caused by instant pressure she used to sleep on a recliner all day and has significant kyphoscoliosis. As far as the wound on her right lower extremity she's not sure how this blister occurred but she thought it has been there for about 2 weeks. No recent blood investigations available and no recent hemoglobin A1c. 12/30/2014 -- she is an assisted living facility but I believe the nurses that have not followed instructions as she had some cream applied on her back and there was a different dressing. Last week she's had a AV fistula placed on her left forearm. 01/13/2015 -- she has had some localized infection at the port site and she's been on doxycycline for this. 02/05/2015 - he has developed a small blister on her right lower extremity. 02/10/2015 -- she has developed another small blister on her right anterior chest wall in the area where she's had tape for her dialysis access. this may just be injury caused by a tape burn. She had a dermatology opinion and they have taken a biopsy of her skin. She also had a left brachial AV fistula placed this week. ========= Objective Constitutional Pulse regular. Respirations normal and unlabored. Afebrile. Vitals Time Taken: 9:30 AM, Height: 61 in, Weight: 160 lbs, BMI: 30.2, Temperature: 98.3 F, Pulse: 82 bpm, Respiratory Rate: 18 breaths/min, Blood Pressure: 145/54 mmHg. Eyes Nonicteric. Reactive to light. Ears, Nose, Mouth, and Throat Lips, teeth, and gums WNL.Marland Kitchen Moist mucosa without lesions. Neck supple and nontender. No palpable supraclavicular or cervical adenopathy. Normal sized without goiter. Respiratory WNL. No retractions.. Cardiovascular Pedal Pulses WNL. No clubbing, cyanosis or edema. Lymphatic No adneopathy. No  adenopathy. No adenopathy. [name] ([id number]) Musculoskeletal Adexa without tenderness or enlargement.. Digits and nails w/o clubbing, cyanosis, infection, petechiae, ischemia, or inflammatory conditions.Marland Kitchen Psychiatric Judgement and insight Intact.. No evidence of depression, anxiety, or agitation.. General Notes: the right ankle wound looks very good and after sharply debriding the base of the wound there is some healthy granulation tissue. We will continue with Santyl for now and move over to [place] soon. Integumentary (Hair, Skin) No suspicious lesions. No crepitus or fluctuance. No peri-wound warmth or erythema. No masses.. Wound #10 status is Open. Original cause of wound was Trauma. The wound is located on the Right,Lateral Malleolus. The wound measures 0.4cm length x 0.4cm width x 0.2cm depth; 0.126cm^2 area and 0.025cm^3 volume. There is Fat Layer (Subcutaneous Tissue) Exposed exposed. There is no tunneling or undermining noted. There is a medium amount of purulent drainage noted. The wound margin is flat and intact. There is medium (34-66%) pink granulation within the wound bed. There is a medium (34-66%) amount of  necrotic tissue within the wound bed including Eschar and Adherent Slough. The periwound skin appearance exhibited: Erythema. The periwound skin appearance did not exhibit: Callus, Crepitus, Excoriation, Induration, Rash, Scarring, Dry/Scaly, Maceration, Atrophie Blanche, Cyanosis, Ecchymosis, Hemosiderin Staining, Mottled, Pallor, Rubor. The surrounding wound skin color is noted with erythema which is circumferential. Periwound temperature was noted as No Abnormality. The periwound has tenderness on palpation. Assessment Active Problems ICD-10 E11.621 - Type 2 diabetes mellitus with foot ulcer N18.6 - End stage renal disease Z99.2 - Dependence on renal dialysis L97.312 - Non-pressure chronic ulcer of right ankle with fat layer exposed Procedures Wound  #10 Pre-procedure diagnosis of Wound #10 is a Trauma, Other located on the Right,Lateral Malleolus . There was a Skin/Subcutaneous Tissue Debridement [id number]) debridement with total area of 0.16 sq cm Sabey, Hayzlee S. ([id number]) performed by [name], MD. with the following instrument(s): Forceps to remove Viable and Non-Viable tissue/material including Fibrin/Slough and Subcutaneous after achieving pain control using Lidocaine 4% Topical Solution. A time out was conducted at 09:40, prior to the start of the procedure. A Minimum amount of bleeding was controlled with Pressure. The procedure was tolerated well with a pain level of 0 throughout and a pain level of 0 following the procedure. Post Debridement Measurements: 0.4cm length x 0.4cm width x 0.2cm depth; 0.025cm^3 volume. Character of Wound/Ulcer Post Debridement is improved. Post procedure Diagnosis Wound #10: Same as Pre-Procedure Plan the wound has improved over the last few weeks and I have recommended: 1. Santyl ointment locally to be applied daily with a bordered foam. 2. Offloading has been discussed with her 3. adequate protein, vitamin A, vitamin C and zinc Electronic Signature(s) Signed: 12/23/2016 9:47:56 AM By: [name], [name] Entered By: [name] on 12/23/2016 09:47:56 Petrakis, [name] ([id number]) -------------------------------------------------------------------------------- SuperBill Details [name]. Date of Service: 12/23/2016 Medical Record Number: [id number] Patient Account Number: [id number] Date of Birth/Sex: [birth year] (61 y.o. Female) Treating RN: Montey Hora Primary Care Provider: [name] Other Clinician: Referring Provider: [name] Treating Provider/Extender: Frann Rider in Treatment: 10 Diagnosis Coding ICD-10 Codes Code Description E11.621 Type 2 diabetes mellitus with foot ulcer N18.6 End stage renal disease Z99.2 Dependence on renal  dialysis L97.312 Non-pressure chronic ulcer of right ankle with fat layer exposed Facility Procedures CPT4 Code Description: [id number] 11042 - DEB SUBQ TISSUE 20 SQ CM/< ICD-10 Description Diagnosis E11.621 Type 2 diabetes mellitus with foot ulcer N18.6 End stage renal disease Z99.2 Dependence on renal dialysis L97.312 Non-pressure chronic ulcer of right  ankle with fat Modifier: layer expose Quantity: 1 d Physician Procedures CPT4 Code Description: [id number] - WC PHYS SUBQ TISS 20 SQ CM ICD-10 Description Diagnosis E11.621 Type 2 diabetes mellitus with foot ulcer N18.6 End stage renal disease Z99.2 Dependence on renal dialysis L97.312 Non-pressure chronic ulcer of right  ankle with fat Modifier: layer expose Quantity: 1 d Electronic Signature(s) Signed: 12/23/2016 9:48:11 AM By: [name], [name] Entered By: [name] on 12/23/2016 09:48:11

## 2016-12-30 ENCOUNTER — Encounter: Payer: Self-pay | Admitting: Interventional Radiology

## 2017-01-06 ENCOUNTER — Encounter: Payer: Medicare Other | Admitting: Physician Assistant

## 2017-01-06 DIAGNOSIS — E11621 Type 2 diabetes mellitus with foot ulcer: Secondary | ICD-10-CM | POA: Diagnosis not present

## 2017-01-08 NOTE — Progress Notes (Signed)
ALITHIA, ZAVALETA ([id number]) Visit Report for 01/06/2017 Chief Complaint Document Details [name], [name]. Date of Service: 01/06/2017 10:15 AM Medical Record Number: [id number] Patient Account Number: [id number] Date of Birth/Sex: 1955-10-10 (61 y.o. Female) Treating RN: Montey Hora Primary Care Provider: [name] Other Clinician: Referring Provider: [name] Treating Provider/Extender: Melburn Hake, HOYT [name]: 12 Information Obtained from: Patient Chief Complaint Patients presents for treatment of an open diabetic ulcer right lateral ankle Electronic Signature(s) Signed: 01/06/2017 5:31:15 PM By: Worthy Keeler PA-C Entered By: Worthy Keeler on 01/06/2017 11:12:44 Mancil, [name] ([id number]) -------------------------------------------------------------------------------- Debridement Details [name]. Date of Service: 01/06/2017 10:15 AM Medical Record Number: [id number] Patient Account Number: [id number] Date of Birth/Sex: [birth year] (61 y.o. Female) Treating RN: Montey Hora Primary Care Provider: [name] Other Clinician: Referring Provider: [name] Treating Provider/Extender: Melburn Hake, HOYT [name]: 12 Debridement Performed for Wound #10 Right,Lateral Malleolus Assessment: Performed By: Physician STONE III, HOYT E., PA-C Debridement: Debridement Pre-procedure Verification/Time Out Yes - 10:58 Taken: Start Time: 10:58 Pain Control: Lidocaine 4% Topical Solution Level: Skin/Subcutaneous Tissue Total Area Debrided (L x 0.5 (cm) x 0.5 (cm) = 0.25 (cm) W): Tissue and other Viable, Non-Viable, Fibrin/Slough, Subcutaneous material debrided: Instrument: Curette Bleeding: Minimum Hemostasis Achieved: Pressure End Time: 11:01 Procedural Pain: 0 Post Procedural Pain: 0 Response to Treatment: Procedure was tolerated well Post Debridement Measurements of Total Wound Length: (cm) 0.5 Width:  (cm) 0.5 Depth: (cm) 0.3 Volume: (cm) 0.059 Character of Wound/Ulcer Post Improved Debridement: Post Procedure Diagnosis Same as Pre-procedure Electronic Signature(s) Signed: 01/06/2017 5:09:52 PM By: Montey Hora Signed: 01/06/2017 5:31:15 PM By: Worthy Keeler PA-C Entered By: Montey Hora on 01/06/2017 11:02:02 Fern, [name] ([id number]) -------------------------------------------------------------------------------- HPI Details [name]. Date of Service: 01/06/2017 10:15 AM Medical Record Number: [id number] Patient Account Number: [id number] Date of Birth/Sex: 1955/10/31 (61 y.o. Female) Treating RN: Montey Hora Primary Care Provider: [name] Other Clinician: Referring Provider: [name] Treating Provider/Extender: STONE III, HOYT [name]: 12 History of Present Illness Location: right lateral ankle Quality: Patient reports No Pain. Severity: Patient states wound (s) are getting better. Duration: Patient has had the wound for > 1 months prior to seeking treatment at the wound center Context: The wound appeared gradually over time, and she thinks she might have hit it with a walker Modifying Factors: Patient is currently on renal dialysis and receives treatments 3 times weekly HPI Description: 61 year old patient known to our practice from several previous visits for various problems now comes back with an ulcerated area on her right lateral malleolus which she's had for about a month and she thinks she may have hit it with her walker. No other problems and no x-ray of this has been taken. She has a past medical history of atrial fibrillation, carotid artery disease, CHF, chronic kidney disease on hemodialysis with a functioning AV fistula seen by Dr. Corene Cornea dew, diabetes mellitus with peripheral neuropathy, pancreatitis and is status post abdominal hysterectomy, AV fistula placement in June 2016, back surgery, cholecystectomy,  coronary angioplasty,several surgeries for AV fistula, and dialysis catheters. She has never been a smoker. 10/17/2016 -- had a right ankle x-ray which showed no acute pathology of the right ankle. Arterial vascular calcifications are noted and there is generalized osteopenia and demineralization 11/11/16 Patient appears to be doing fairly well with regard to her right ankle wound. She has been tolerating the dressing changes with the Santyl that does  seem to have loosened up the slough a bit. She has also been tolerating the debridement's when they have been performed. She tells me that she does tend to lay on her right side and this seems to be aggravating the issue. She does not have a Prevalon boot for use at this facility. Fortunately she is not having significant pain. 01/06/17 on evaluation today patient's right lateral ankle wound appears to be doing fairly well. There was some Slough covering although there is no evidence of infection and she does not seem to have significant pain. She is pleased with how this is progressing compared to where it was in the beginning but wishes it would completely close. ===== Old notes: 61 year old patient who is known to be a diabetic and has end-stage renal disease has had several comorbidities including coronary artery disease, hypertension, hyperlipidemia, pancreatitis, anemia, previous history of hysterectomy, cholecystectomy, left-sided salivary gland excision, bilateral cataract surgery,Peritoneal dialysis catheter, hemodialysis catheter. the area on the back has also been caused by instant pressure she used to sleep on a recliner all day and has significant kyphoscoliosis. As far as the wound on her right lower extremity she's not sure how this Manera, Lillionna S. ([id number]) blister occurred but she thought it has been there for about 2 weeks. No recent blood investigations available and no recent hemoglobin A1c. 12/30/2014 -- she is an assisted  living facility but I believe the nurses that have not followed instructions as she had some cream applied on her back and there was a different dressing. Last week she's had a AV fistula placed on her left forearm. 01/13/2015 -- she has had some localized infection at the port site and she's been on doxycycline for this. 02/05/2015 - he has developed a small blister on her right lower extremity. 02/10/2015 -- she has developed another small blister on her right anterior chest wall in the area where she's had tape for her dialysis access. this may just be injury caused by a tape burn. She had a dermatology opinion and they have taken a biopsy of her skin. She also had a left brachial AV fistula placed this week. ========= Electronic Signature(s) Signed: 01/06/2017 5:31:15 PM By: Worthy Keeler PA-C Entered By: Worthy Keeler on 01/06/2017 11:13:39 Pincus, [name] ([id number]) -------------------------------------------------------------------------------- Physical Exam Details [name]. Date of Service: 01/06/2017 10:15 AM Medical Record Number: [id number] Patient Account Number: [id number] Date of Birth/Sex: April 12, 1956 (61 y.o. Female) Treating RN: Montey Hora Primary Care Provider: [name] Other Clinician: Referring Provider: [name] Treating Provider/Extender: STONE III, HOYT [name]: 33 Constitutional Well-nourished and well-hydrated in no acute distress. Respiratory normal breathing without difficulty. clear to auscultation bilaterally. Cardiovascular regular rate and rhythm with normal S1, S2. Psychiatric this patient is able to make decisions and demonstrates good insight into disease process. Alert and Oriented x 3. pleasant and cooperative. Notes Patient has slough noted in the central portion of the wound which did required debridement but she tolerated this extremely well and the wound bed appeared good although there is  fascia overlying the malleolus noted at the base. She does have some surrounding granulation. I feel like a collagen dressing may be beneficial for this patient. Electronic Signature(s) Signed: 01/06/2017 5:31:15 PM By: Worthy Keeler PA-C Entered By: Worthy Keeler on 01/06/2017 11:14:36 Stacks, [name] ([id number]) -------------------------------------------------------------------------------- Physician Orders Details [name]. Date of Service: 01/06/2017 10:15 AM Medical Record Number: [id number] Patient Account Number: [id number]  Date of Birth/Sex: Sep 30, 1955 (61 y.o. Female) Treating RN: Montey Hora Primary Care Provider: [name] Other Clinician: Referring Provider: [name] Treating Provider/Extender: Melburn Hake, HOYT [name]: 12 Verbal / Phone Orders: No Diagnosis Coding ICD-10 Coding Code Description E11.621 Type 2 diabetes mellitus with foot ulcer N18.6 End stage renal disease Z99.2 Dependence on renal dialysis L97.312 Non-pressure chronic ulcer of right ankle with fat layer exposed Wound Cleansing Wound #10 Right,Lateral Malleolus o Clean wound with Normal Saline. o May Shower, gently pat wound dry prior to applying new dressing. Anesthetic Wound #10 Right,Lateral Malleolus o Topical Lidocaine 4% cream applied to wound bed prior to debridement Primary Wound Dressing Wound #10 Right,Lateral Malleolus o Prisma Ag Secondary Dressing Wound #10 Right,Lateral Malleolus o Dry Gauze o Boardered Foam Dressing Dressing Change Frequency Wound #10 Right,Lateral Malleolus o Change dressing every day. Follow-up Appointments Wound #10 Right,Lateral Malleolus o Return Appointment in 2 weeks. Edema Control Hargadon, ASHLEAH VALTIERRA. ([id number]) Wound #10 Right,Lateral Malleolus o Patient to wear own compression stockings Notes We will initiate treatment with the silver collagen for the next two weeks and see were things  stand at that point. If anything worsens in the interim she will contact the office for additional recommendations. Otherwise hopefully we will get additional granulation and filling in in the interim. Electronic Signature(s) Signed: 01/06/2017 5:31:15 PM By: Worthy Keeler PA-C Entered By: Worthy Keeler on 01/06/2017 11:15:18 Aubuchon, [name] ([id number]) -------------------------------------------------------------------------------- Problem List Details [name]. Date of Service: 01/06/2017 10:15 AM Medical Record Number: [id number] Patient Account Number: [id number] Date of Birth/Sex: Apr 08, 1956 (61 y.o. Female) Treating RN: Montey Hora Primary Care Provider: [name] Other Clinician: Referring Provider: [name] Treating Provider/Extender: Melburn Hake, HOYT [name]: 12 Active Problems ICD-10 Encounter Code Description Active Date Diagnosis E11.621 Type 2 diabetes mellitus with foot ulcer 10/10/2016 Yes N18.6 End stage renal disease 10/10/2016 Yes Z99.2 Dependence on renal dialysis 10/10/2016 Yes L97.312 Non-pressure chronic ulcer of right ankle with fat layer 10/10/2016 Yes exposed Inactive Problems Resolved Problems Electronic Signature(s) Signed: 01/06/2017 5:31:15 PM By: Worthy Keeler PA-C Entered By: Worthy Keeler on 01/06/2017 10:56:33 Bribiesca, [name] ([id number]) -------------------------------------------------------------------------------- Progress Note Details [name]. Date of Service: 01/06/2017 10:15 AM Medical Record Number: [id number] Patient Account Number: [id number] Date of Birth/Sex: May 24, 1956 (61 y.o. Female) Treating RN: Montey Hora Primary Care Provider: [name] Other Clinician: Referring Provider: [name] Treating Provider/Extender: Melburn Hake, HOYT [name]: 12 Subjective Chief Complaint Information obtained from Patient Patients presents for treatment of an  open diabetic ulcer right lateral ankle History of Present Illness (HPI) The following HPI elements were documented for the patient's wound: Location: right lateral ankle Quality: Patient reports No Pain. Severity: Patient states wound (s) are getting better. Duration: Patient has had the wound for > 1 months prior to seeking treatment at the wound center Context: The wound appeared gradually over time, and she thinks she might have hit it with a walker Modifying Factors: Patient is currently on renal dialysis and receives treatments 3 times weekly 61 year old patient known to our practice from several previous visits for various problems now comes back with an ulcerated area on her right lateral malleolus which she's had for about a month and she thinks she may have hit it with her walker. No other problems and no x-ray of this has been taken. She has a past medical history of atrial fibrillation, carotid artery disease, CHF, chronic  kidney disease on hemodialysis with a functioning AV fistula seen by Dr. Corene Cornea dew, diabetes mellitus with peripheral neuropathy, pancreatitis and is status post abdominal hysterectomy, AV fistula placement in June 2016, back surgery, cholecystectomy, coronary angioplasty,several surgeries for AV fistula, and dialysis catheters. She has never been a smoker. 10/17/2016 -- had a right ankle x-ray which showed no acute pathology of the right ankle. Arterial vascular calcifications are noted and there is generalized osteopenia and demineralization 11/11/16 Patient appears to be doing fairly well with regard to her right ankle wound. She has been tolerating the dressing changes with the Santyl that does seem to have loosened up the slough a bit. She has also been tolerating the debridement's when they have been performed. She tells me that she does tend to lay on her right side and this seems to be aggravating the issue. She does not have a Prevalon boot for use  at this facility. Fortunately she is not having significant pain. 01/06/17 on evaluation today patient's right lateral ankle wound appears to be doing fairly well. There was some Slough covering although there is no evidence of infection and she does not seem to have significant pain. She is pleased with how this is progressing compared to where it was in the beginning but wishes it would completely close. ===== Old notes: JENNICA, TAGLIAFERRI ([id number]) 61 year old patient who is known to be a diabetic and has end-stage renal disease has had several comorbidities including coronary artery disease, hypertension, hyperlipidemia, pancreatitis, anemia, previous history of hysterectomy, cholecystectomy, left-sided salivary gland excision, bilateral cataract surgery,Peritoneal dialysis catheter, hemodialysis catheter. the area on the back has also been caused by instant pressure she used to sleep on a recliner all day and has significant kyphoscoliosis. As far as the wound on her right lower extremity she's not sure how this blister occurred but she thought it has been there for about 2 weeks. No recent blood investigations available and no recent hemoglobin A1c. 12/30/2014 -- she is an assisted living facility but I believe the nurses that have not followed instructions as she had some cream applied on her back and there was a different dressing. Last week she's had a AV fistula placed on her left forearm. 01/13/2015 -- she has had some localized infection at the port site and she's been on doxycycline for this. 02/05/2015 - he has developed a small blister on her right lower extremity. 02/10/2015 -- she has developed another small blister on her right anterior chest wall in the area where she's had tape for her dialysis access. this may just be injury caused by a tape burn. She had a dermatology opinion and they have taken a biopsy of her skin. She also had a left brachial AV fistula placed this  week. ========= Objective Constitutional Well-nourished and well-hydrated in no acute distress. Vitals Time Taken: 10:23 AM, Height: 61 in, Weight: 160 lbs, BMI: 30.2, Temperature: 98.1 F, Pulse: 65 bpm, Respiratory Rate: 20 breaths/min, Blood Pressure: 117/45 mmHg. Respiratory normal breathing without difficulty. clear to auscultation bilaterally. Cardiovascular regular rate and rhythm with normal S1, S2. Psychiatric this patient is able to make decisions and demonstrates good insight into disease process. Alert and Oriented x 3. pleasant and cooperative. General Notes: Patient has slough noted in the central portion of the wound which did required debridement but she tolerated this extremely well and the wound bed appeared good although there is fascia overlying the malleolus noted at the base. She does have some surrounding granulation. I  feel like a collagen Byard, Ilyssa S. ([id number]) dressing may be beneficial for this patient. Integumentary (Hair, Skin) Wound #10 status is Open. Original cause of wound was Trauma. The wound is located on the Right,Lateral Malleolus. The wound measures 0.5cm length x 0.5cm width x 0.2cm depth; 0.196cm^2 area and 0.039cm^3 volume. There is Fat Layer (Subcutaneous Tissue) Exposed exposed. There is no tunneling or undermining noted. There is a medium amount of purulent drainage noted. The wound margin is flat and intact. There is small (1-33%) pink granulation within the wound bed. There is a large (67-100%) amount of necrotic tissue within the wound bed including Eschar and Adherent Slough. The periwound skin appearance exhibited: Erythema. The periwound skin appearance did not exhibit: Callus, Crepitus, Excoriation, Induration, Rash, Scarring, Dry/Scaly, Maceration, Atrophie Blanche, Cyanosis, Ecchymosis, Hemosiderin Staining, Mottled, Pallor, Rubor. The surrounding wound skin color is noted with erythema which is circumferential. Periwound  temperature was noted as No Abnormality. The periwound has tenderness on palpation. Assessment Active Problems ICD-10 E11.621 - Type 2 diabetes mellitus with foot ulcer N18.6 - End stage renal disease Z99.2 - Dependence on renal dialysis L97.312 - Non-pressure chronic ulcer of right ankle with fat layer exposed Procedures Wound #10 Pre-procedure diagnosis of Wound #10 is a Trauma, Other located on the Right,Lateral Malleolus . There was a Skin/Subcutaneous Tissue Debridement ([id number]) debridement with total area of 0.25 sq cm performed by STONE III, HOYT E., PA-C. with the following instrument(s): Curette to remove Viable and Non-Viable tissue/material including Fibrin/Slough and Subcutaneous after achieving pain control using Lidocaine 4% Topical Solution. A time out was conducted at 10:58, prior to the start of the procedure. A Minimum amount of bleeding was controlled with Pressure. The procedure was tolerated well with a pain level of 0 throughout and a pain level of 0 following the procedure. Post Debridement Measurements: 0.5cm length x 0.5cm width x 0.3cm depth; 0.059cm^3 volume. Character of Wound/Ulcer Post Debridement is improved. Post procedure Diagnosis Wound #10: Same as Pre-Procedure Stolz, Billiejo S. ([id number]) Plan Wound Cleansing: Wound #10 Right,Lateral Malleolus: Clean wound with Normal Saline. May Shower, gently pat wound dry prior to applying new dressing. Anesthetic: Wound #10 Right,Lateral Malleolus: Topical Lidocaine 4% cream applied to wound bed prior to debridement Primary Wound Dressing: Wound #10 Right,Lateral Malleolus: Prisma Ag Secondary Dressing: Wound #10 Right,Lateral Malleolus: Dry Gauze Boardered Foam Dressing Dressing Change Frequency: Wound #10 Right,Lateral Malleolus: Change dressing every day. Follow-up Appointments: Wound #10 Right,Lateral Malleolus: Return Appointment in 2 weeks. Edema Control: Wound #10 Right,Lateral  Malleolus: Patient to wear own compression stockings General Notes: We will initiate treatment with the silver collagen for the next two weeks and see were things stand at that point. If anything worsens in the interim she will contact the office for additional recommendations. Otherwise hopefully we will get additional granulation and filling in in the interim. Electronic Signature(s) Signed: 01/06/2017 5:31:15 PM By: Worthy Keeler PA-C Entered By: Worthy Keeler on 01/06/2017 11:16:06 Pinkham, [name] ([id number]) -------------------------------------------------------------------------------- SuperBill Details [name]. Date of Service: 01/06/2017 Medical Record Number: [id number] Patient Account Number: [id number] Date of Birth/Sex: Sep 10, 1955 (61 y.o. Female) Treating RN: Montey Hora Primary Care Provider: [name] Other Clinician: Referring Provider: [name] Treating Provider/Extender: Melburn Hake, HOYT [name]: 12 Diagnosis Coding ICD-10 Codes Code Description E11.621 Type 2 diabetes mellitus with foot ulcer N18.6 End stage renal disease Z99.2 Dependence on renal dialysis L97.312 Non-pressure chronic ulcer of right ankle with fat layer exposed Facility Procedures  CPT4 Code Description: [id number] 11042 - DEB SUBQ TISSUE 20 SQ CM/< ICD-10 Description Diagnosis L97.312 Non-pressure chronic ulcer of right ankle with fat Modifier: layer expose Quantity: 1 d Physician Procedures CPT4 Code Description: [id number] 11042 - WC PHYS SUBQ TISS 20 SQ CM ICD-10 Description Diagnosis K38.381 Non-pressure chronic ulcer of right ankle with fat Modifier: layer expose Quantity: 1 d Electronic Signature(s) Signed: 01/06/2017 5:31:15 PM By: Worthy Keeler PA-C Entered By: Worthy Keeler on 01/06/2017 11:16:24

## 2017-01-08 NOTE — Progress Notes (Signed)
[name], [name] ([id number]) Visit Report for 01/06/2017 Arrival Information Details [name], [name]. Date of Service: 01/06/2017 10:15 AM Medical Record Number: [id number] Patient Account Number: [id number] Date of Birth/Sex: [birth year] (61 y.o. Female) Treating RN: Montey Hora Primary Care Tal Neer: [name] Other Clinician: Referring Devine Klingel: [name] Treating Ludmila Ebarb/Extender: Melburn Hake, HOYT [name]: 12 Visit Information History Since Last Visit Added or deleted any medications: No Patient Arrived: Wheel Chair Any new allergies or adverse reactions: No Arrival Time: 10:21 Had a fall or experienced change in No Accompanied By: self activities of daily living that may affect Transfer Assistance: None risk of falls: Patient Identification Verified: Yes Signs or symptoms of abuse/neglect since last No Secondary Verification Process Yes visito Completed: Hospitalized since last visit: No Patient Has Alerts: Yes Has Dressing in Place as Prescribed: Yes Patient Alerts: Patient on Blood Pain Present Now: No Thinner Electronic Signature(s) Signed: 01/06/2017 5:09:52 PM By: Montey Hora Entered By: Montey Hora on 01/06/2017 10:22:18 Kindig, [name] ([id number]) -------------------------------------------------------------------------------- Encounter Discharge Information Details Patient Name: YJEHU, [name] Date of Service: 01/06/2017 10:15 AM Medical Record Number: [id number] Patient Account Number: [id number] Date of Birth/Sex: 12-15-1955 (61 y.o. Female) Treating RN: Montey Hora Primary Care Shelba Susi: [name] Other Clinician: Referring Madex Seals: [name] Treating Jahvier Aldea/Extender: Melburn Hake, HOYT [name]: 12 Encounter Discharge Information Items Discharge Pain Level: 0 Discharge Condition: Stable Ambulatory Status: Wheelchair Discharge Destination: Nursing Home Transportation: Private Auto Accompanied  By: self Schedule Follow-up Appointment: Yes Medication Reconciliation completed and provided to Patient/Care No Roseline Ebarb: Provided on Clinical Summary of Care: 01/06/2017 Form Type Recipient Paper Patient LD Electronic Signature(s) Signed: 01/06/2017 11:39:33 AM By: Montey Hora Previous Signature: 01/06/2017 11:03:58 AM Version By: Ruthine Dose Entered By: Montey Hora on 01/06/2017 11:39:32 [name], [name] ([id number]) -------------------------------------------------------------------------------- Lower Extremity Assessment Details Patient Name: [name], [name] Date of Service: 01/06/2017 10:15 AM Medical Record Number: [id number] Patient Account Number: [id number] Date of Birth/Sex: [birth year] (61 y.o. Female) Treating RN: Montey Hora Primary Care Leean Amezcua: [name] Other Clinician: Referring Tyah Acord: [name] Treating Faisal Stradling/Extender: Melburn Hake, HOYT [name]: 12 Vascular Assessment Pulses: Dorsalis Pedis Palpable: [Right:Yes] Posterior Tibial Extremity colors, hair growth, and conditions: Extremity Color: [Right:Normal] Hair Growth on Extremity: [Right:No] Temperature of Extremity: [Right:Warm] Capillary Refill: [Right:< 3 seconds] Electronic Signature(s) Signed: 01/06/2017 5:09:52 PM By: Montey Hora Entered By: Montey Hora on 01/06/2017 10:28:42 [name], [name] ([id number]) -------------------------------------------------------------------------------- Multi Wound Chart Details Patient Name: GGEZM, [name] Date of Service: 01/06/2017 10:15 AM Medical Record Number: [id number] Patient Account Number: [id number] Date of Birth/Sex: May 31, 1956 (61 y.o. Female) Treating RN: Montey Hora Primary Care Boe Deans: [name] Other Clinician: Referring Khristin Keleher: [name] Treating Kessler Kopinski/Extender: STONE III, HOYT [name]: 12 Vital Signs Height(in): 61 Pulse(bpm): 65 Weight(lbs): 160 Blood  Pressure 117/45 (mmHg): Body Mass Index(BMI): 30 Temperature(F): 98.1 Respiratory Rate 20 (breaths/min): Photos: [10:No Photos] [N/A:N/A] Wound Location: [10:Right Malleolus - Lateral N/A] Wounding Event: [10:Trauma] [N/A:N/A] Primary Etiology: [10:Trauma, Other] [N/A:N/A] Comorbid History: [10:Glaucoma, Anemia, Coronary Artery Disease, Hypertension, Type II Diabetes, End Stage Renal Disease, Osteoarthritis, Neuropathy] [N/A:N/A] Date Acquired: [10:09/12/2016] [N/A:N/A] Weeks of Treatment: [10:12] [N/A:N/A] Wound Status: [10:Open] [N/A:N/A] Measurements L x W x D 0.5x0.5x0.2 [N/A:N/A] (cm) Area (cm) : [10:0.196] [N/A:N/A] Volume (cm) : [10:0.039] [N/A:N/A] % Reduction in Area: [10:30.70%] [N/A:N/A] % Reduction in Volume: 31.60% [N/A:N/A] Classification: [10:Full Thickness Without Exposed Support Structures] [N/A:N/A] HBO Classification: [10:Grade 1] [N/A:N/A] Exudate Amount: [10:Medium] [N/A:N/A] Exudate Type: [10:Purulent] [N/A:N/A] Exudate Color: [  10:yellow, brown, green] [N/A:N/A] Wound Margin: [10:Flat and Intact] [N/A:N/A] Granulation Amount: [10:Small (1-33%)] [N/A:N/A] Granulation Quality: [10:Pink] [N/A:N/A] Necrotic Amount: [10:Large (67-100%)] [N/A:N/A] Necrotic Tissue: Eschar, Adherent Slough N/A N/A Exposed Structures: Fat Layer (Subcutaneous N/A N/A Tissue) Exposed: Yes Fascia: No Tendon: No Muscle: No Joint: No Bone: No Epithelialization: None N/A N/A Periwound Skin Texture: Excoriation: No N/A N/A Induration: No Callus: No Crepitus: No Rash: No Scarring: No Periwound Skin Maceration: No N/A N/A Moisture: Dry/Scaly: No Periwound Skin Color: Erythema: Yes N/A N/A Atrophie Blanche: No Cyanosis: No Ecchymosis: No Hemosiderin Staining: No Mottled: No Pallor: No Rubor: No Erythema Location: Circumferential N/A N/A Temperature: No Abnormality N/A N/A Tenderness on Yes N/A N/A Palpation: Wound Preparation: Ulcer Cleansing: N/A  N/A Rinsed/Irrigated with Saline Topical Anesthetic Applied: Other: lidocaine 4% Treatment Notes Electronic Signature(s) Signed: 01/06/2017 5:09:52 PM By: Montey Hora Entered By: Montey Hora on 01/06/2017 10:39:13 [name], [name] ([id number]) -------------------------------------------------------------------------------- [place] Details Patient Name: [name] Cornwall. Date of Service: 01/06/2017 10:15 AM Medical Record Number: [id number] Patient Account Number: [id number] Date of Birth/Sex: 07/06/1956 (61 y.o. Female) Treating RN: Montey Hora Primary Care Arius Harnois: [name] Other Clinician: Referring Soliana Kitko: [name] Treating Mollyann Halbert/Extender: Melburn Hake, HOYT [name]: 12 Active Inactive ` Abuse / Safety / Falls / Self Care Management Nursing Diagnoses: Potential for falls Goals: Patient will remain injury free Date Initiated: 10/10/2016 Target Resolution Date: 12/23/2016 Goal Status: Active Interventions: Assess fall risk on admission and as needed Notes: ` Nutrition Nursing Diagnoses: Potential for alteratiion in Nutrition/Potential for imbalanced nutrition Goals: Patient/caregiver agrees to and verbalizes understanding of need to use nutritional supplements and/or vitamins as prescribed Date Initiated: 10/10/2016 Target Resolution Date: 10/21/2016 Goal Status: Active Interventions: Assess patient nutrition upon admission and as needed per policy Notes: ` Orientation to the Wound Care Program Nursing Diagnoses: Knowledge deficit related to the wound healing center program Guild, GIANINA OLINDE ([id number]) Goals: Patient/caregiver will verbalize understanding of the [place] Date Initiated: 10/10/2016 Target Resolution Date: 12/23/2016 Goal Status: Active Interventions: Provide education on orientation to the wound center Notes: ` Wound/Skin Impairment Nursing Diagnoses: Impaired tissue  integrity Goals: Patient/caregiver will verbalize understanding of skin care regimen Date Initiated: 10/10/2016 Target Resolution Date: 12/23/2016 Goal Status: Active Ulcer/skin breakdown will have a volume reduction of 30% by week 4 Date Initiated: 10/10/2016 Target Resolution Date: 12/23/2016 Goal Status: Active Ulcer/skin breakdown will have a volume reduction of 50% by week 8 Date Initiated: 10/10/2016 Target Resolution Date: 12/23/2016 Goal Status: Active Ulcer/skin breakdown will have a volume reduction of 80% by week 12 Date Initiated: 10/10/2016 Target Resolution Date: 12/23/2016 Goal Status: Active Ulcer/skin breakdown will heal within 14 weeks Date Initiated: 10/10/2016 Target Resolution Date: 12/23/2016 Goal Status: Active Interventions: Assess patient/caregiver ability to obtain necessary supplies Assess patient/caregiver ability to perform ulcer/skin care regimen upon admission and as needed Assess ulceration(s) every visit Notes: Electronic Signature(s) Signed: 01/06/2017 5:09:52 PM By: Montey Hora Entered By: Montey Hora on 01/06/2017 10:39:02 Brassell, [name] ([id number]) -------------------------------------------------------------------------------- Pain Assessment Details Patient Name: [name] Cornwall. Date of Service: 01/06/2017 10:15 AM Medical Record Number: [id number] Patient Account Number: [id number] Date of Birth/Sex: 12/17/1955 (61 y.o. Female) Treating RN: Montey Hora Primary Care [name] Schuld: [name] Other Clinician: Referring Joci Dress: [name] Treating Alif Petrak/Extender: Melburn Hake, HOYT [name]: 12 Active Problems Location of Pain Severity and Description of Pain Patient Has Paino No Site Locations Pain Management and Medication Current Pain Management: Notes Topical or injectable lidocaine is  offered to patient for acute pain when surgical debridement is performed. If needed, Patient is instructed to use over the counter pain  medication for the following 24-48 hours after debridement. Wound care MDs do not prescribed pain medications. Patient has chronic pain or uncontrolled pain. Patient has been instructed to make an appointment with their Primary Care Physician for pain management. Electronic Signature(s) Signed: 01/06/2017 5:09:52 PM By: Montey Hora Entered By: Montey Hora on 01/06/2017 10:23:31 Moscoso, [name] ([id number]) -------------------------------------------------------------------------------- Patient/Caregiver Education Details Patient Name: [name] Cornwall. Date of Service: 01/06/2017 10:15 AM Medical Record Number: [id number] Patient Account Number: [id number] Date of Birth/Gender: 1955-10-21 (61 y.o. Female) Treating RN: Montey Hora Primary Care Physician: [name] Other Clinician: Referring Physician: [name] Treating Physician/Extender: Sharalyn Ink in Treatment: 12 Education Assessment Education Provided To: Patient and Caregiver SNF nurses via written orders Education Topics Provided Wound/Skin Impairment: Handouts: Other: wound care as ordered Methods: Demonstration, Explain/Verbal, Printed Responses: State content correctly Electronic Signature(s) Signed: 01/06/2017 5:09:52 PM By: Montey Hora Entered By: Montey Hora on 01/06/2017 11:40:05 Demonbreun, [name] ([id number]) -------------------------------------------------------------------------------- Wound Assessment Details Patient Name: Pancoast, [name] Date of Service: 01/06/2017 10:15 AM Medical Record Number: [id number] Patient Account Number: [id number] Date of Birth/Sex: 12-08-1955 (61 y.o. Female) Treating RN: Montey Hora Primary Care Floride Hutmacher: [name] Other Clinician: Referring Kieli Golladay: [name] Treating Manjot Beumer/Extender: STONE III, HOYT [name]: 12 Wound Status Wound Number: 10 Primary Trauma, Other Etiology: Wound Location: Right Malleolus - Lateral Wound  Open Wounding Event: Trauma Status: Date Acquired: 09/12/2016 Comorbid Glaucoma, Anemia, Coronary Artery Weeks Of Treatment: 12 History: Disease, Hypertension, Type II Clustered Wound: No Diabetes, End Stage Renal Disease, Osteoarthritis, Neuropathy Photos Photo Uploaded By: Montey Hora on 01/06/2017 14:00:46 Wound Measurements Length: (cm) 0.5 Width: (cm) 0.5 Depth: (cm) 0.2 Area: (cm) 0.196 Volume: (cm) 0.039 % Reduction in Area: 30.7% % Reduction in Volume: 31.6% Epithelialization: None Tunneling: No Undermining: No Wound Description Full Thickness Without Classification: Exposed Support Structures Diabetic Severity Grade 1 (Wagner): Wound Margin: Flat and Intact Exudate Amount: Medium Exudate Type: Purulent Exudate Color: yellow, brown, green Foul Odor After Cleansing: No Slough/Fibrino Yes Wound Bed Duell, [name] ([id number]) Granulation Amount: Small (1-33%) Exposed Structure Granulation Quality: Pink Fascia Exposed: No Necrotic Amount: Large (67-100%) Fat Layer (Subcutaneous Tissue) Exposed: Yes Necrotic Quality: Eschar, Adherent Slough Tendon Exposed: No Muscle Exposed: No Joint Exposed: No Bone Exposed: No Periwound Skin Texture Texture Color No Abnormalities Noted: No No Abnormalities Noted: No Callus: No Atrophie Blanche: No Crepitus: No Cyanosis: No Excoriation: No Ecchymosis: No Induration: No Erythema: Yes Rash: No Erythema Location: Circumferential Scarring: No Hemosiderin Staining: No Mottled: No Moisture Pallor: No No Abnormalities Noted: No Rubor: No Dry / Scaly: No Maceration: No Temperature / Pain Temperature: No Abnormality Tenderness on Palpation: Yes Wound Preparation Ulcer Cleansing: Rinsed/Irrigated with Saline Topical Anesthetic Applied: Other: lidocaine 4%, Treatment Notes Wound #10 (Right, Lateral Malleolus) 1. Cleansed with: Clean wound with Normal Saline 2. Anesthetic Topical Lidocaine 4% cream to  wound bed prior to debridement 4. Dressing Applied: Prisma Ag 5. Secondary Dressing Applied Bordered Foam Dressing Dry Gauze Electronic Signature(s) Signed: 01/06/2017 5:09:52 PM By: Montey Hora Entered By: Montey Hora on 01/06/2017 10:28:02 Bashore, [name] ([id number]) -------------------------------------------------------------------------------- Vitals Details Patient Name: [name] Cornwall. Date of Service: 01/06/2017 10:15 AM Medical Record Number: [id number] Patient Account Number: [id number] Date of Birth/Sex: 09-Jan-1956 (60 y.o. Female) Treating RN: Montey Hora Primary Care Ausar Georgiou: [name] Other Clinician: Referring Garrett Bowring: Brynda Greathouse  ERNEST Treating Icie Kuznicki/Extender: STONE III, HOYT [name]: 12 Vital Signs Time Taken: 10:23 Temperature (F): 98.1 Height (in): 61 Pulse (bpm): 65 Weight (lbs): 160 Respiratory Rate (breaths/min): 20 Body Mass Index (BMI): 30.2 Blood Pressure (mmHg): 117/45 Reference Range: 80 - 120 mg / dl Electronic Signature(s) Signed: 01/06/2017 5:09:52 PM By: Montey Hora Entered By: Montey Hora on 01/06/2017 10:23:51

## 2017-01-20 ENCOUNTER — Encounter: Payer: Medicare Other | Attending: Physician Assistant | Admitting: Physician Assistant

## 2017-01-20 DIAGNOSIS — N186 End stage renal disease: Secondary | ICD-10-CM | POA: Diagnosis not present

## 2017-01-20 DIAGNOSIS — I132 Hypertensive heart and chronic kidney disease with heart failure and with stage 5 chronic kidney disease, or end stage renal disease: Secondary | ICD-10-CM | POA: Diagnosis not present

## 2017-01-20 DIAGNOSIS — Z992 Dependence on renal dialysis: Secondary | ICD-10-CM | POA: Insufficient documentation

## 2017-01-20 DIAGNOSIS — E785 Hyperlipidemia, unspecified: Secondary | ICD-10-CM | POA: Diagnosis not present

## 2017-01-20 DIAGNOSIS — E11621 Type 2 diabetes mellitus with foot ulcer: Secondary | ICD-10-CM | POA: Insufficient documentation

## 2017-01-20 DIAGNOSIS — E1122 Type 2 diabetes mellitus with diabetic chronic kidney disease: Secondary | ICD-10-CM | POA: Insufficient documentation

## 2017-01-20 DIAGNOSIS — I509 Heart failure, unspecified: Secondary | ICD-10-CM | POA: Insufficient documentation

## 2017-01-20 DIAGNOSIS — I4891 Unspecified atrial fibrillation: Secondary | ICD-10-CM | POA: Diagnosis not present

## 2017-01-20 DIAGNOSIS — L97312 Non-pressure chronic ulcer of right ankle with fat layer exposed: Secondary | ICD-10-CM | POA: Insufficient documentation

## 2017-01-20 DIAGNOSIS — I2510 Atherosclerotic heart disease of native coronary artery without angina pectoris: Secondary | ICD-10-CM | POA: Insufficient documentation

## 2017-01-20 DIAGNOSIS — D649 Anemia, unspecified: Secondary | ICD-10-CM | POA: Diagnosis not present

## 2017-01-20 DIAGNOSIS — E1142 Type 2 diabetes mellitus with diabetic polyneuropathy: Secondary | ICD-10-CM | POA: Insufficient documentation

## 2017-01-21 NOTE — Progress Notes (Signed)
[name], [name] ([id number]) Visit Report for 01/20/2017 Chief Complaint Document Details [name], [name]. Date of Service: 01/20/2017 9:45 AM Medical Record Number: [id number] Patient Account Number: [id number] Date of Birth/Sex: 24-Dec-1955 (61 y.o. Female) Treating RN: Carolyne Fiscal, Debi Primary Care Provider: [name] Other Clinician: Referring Provider: [name] Treating Provider/Extender: Melburn Hake, HOYT [name]: 14 Information Obtained from: Patient Chief Complaint Patients presents for treatment of an open diabetic ulcer right lateral ankle Electronic Signature(s) Signed: 01/20/2017 4:47:40 PM By: Worthy Keeler PA-C Entered By: Worthy Keeler on 01/20/2017 10:39:56 Mateja, [name] ([id number]) -------------------------------------------------------------------------------- Debridement Details [name]. Date of Service: 01/20/2017 9:45 AM Medical Record Number: [id number] Patient Account Number: [id number] Date of Birth/Sex: [birth year] (61 y.o. Female) Treating RN: Carolyne Fiscal, Debi Primary Care Provider: [name] Other Clinician: Referring Provider: [name] Treating Provider/Extender: Melburn Hake, HOYT [name]: 14 Debridement Performed for Wound #10 Right,Lateral Malleolus Assessment: Performed By: Physician STONE III, HOYT E., PA-C Debridement: Debridement Pre-procedure Verification/Time Out Yes - 10:17 Taken: Start Time: 10:18 Pain Control: Lidocaine 2% Topical Liquid Level: Skin/Subcutaneous Tissue Total Area Debrided (L x 0.4 (cm) x 0.4 (cm) = 0.16 (cm) W): Tissue and other Viable, Non-Viable, Exudate, Fibrin/Slough, Subcutaneous material debrided: Instrument: Curette Bleeding: Minimum Hemostasis Achieved: Pressure End Time: 10:20 Procedural Pain: 0 Post Procedural Pain: 0 Response to Treatment: Procedure was tolerated well Post Debridement Measurements of Total Wound Length: (cm)  0.4 Width: (cm) 0.4 Depth: (cm) 0.2 Volume: (cm) 0.025 Character of Wound/Ulcer Post Requires Further Debridement Debridement: Post Procedure Diagnosis Same as Pre-procedure Electronic Signature(s) Signed: 01/20/2017 4:17:34 PM By: Alric Quan Signed: 01/20/2017 4:47:40 PM By: Worthy Keeler PA-C Entered By: Alric Quan on 01/20/2017 10:19:20 Digiacomo, [name] ([id number]) -------------------------------------------------------------------------------- HPI Details [name]. Date of Service: 01/20/2017 9:45 AM Medical Record Number: [id number] Patient Account Number: [id number] Date of Birth/Sex: Apr 03, 1956 (61 y.o. Female) Treating RN: Carolyne Fiscal, Debi Primary Care Provider: [name] Other Clinician: Referring Provider: [name] Treating Provider/Extender: STONE III, HOYT [name]: 14 History of Present Illness Location: right lateral ankle Quality: Patient reports No Pain. Severity: Patient states wound (s) are getting better. Duration: Patient has had the wound for > 1 months prior to seeking treatment at the wound center Context: The wound appeared gradually over time, and she thinks she might have hit it with a walker Modifying Factors: Patient is currently on renal dialysis and receives treatments 3 times weekly HPI Description: 61 year old patient known to our practice from several previous visits for various problems now comes back with an ulcerated area on her right lateral malleolus which she's had for about a month and she thinks she may have hit it with her walker. No other problems and no x-ray of this has been taken. She has a past medical history of atrial fibrillation, carotid artery disease, CHF, chronic kidney disease on hemodialysis with a functioning AV fistula seen by Dr. Corene Cornea dew, diabetes mellitus with peripheral neuropathy, pancreatitis and is status post abdominal hysterectomy, AV fistula placement in June  2016, back surgery, cholecystectomy, coronary angioplasty,several surgeries for AV fistula, and dialysis catheters. She has never been a smoker. 10/17/2016 -- had a right ankle x-ray which showed no acute pathology of the right ankle. Arterial vascular calcifications are noted and there is generalized osteopenia and demineralization 11/11/16 Patient appears to be doing fairly well with regard to her right ankle wound. She has been tolerating the dressing changes with the  Santyl that does seem to have loosened up the slough a bit. She has also been tolerating the debridement's when they have been performed. She tells me that she does tend to lay on her right side and this seems to be aggravating the issue. She does not have a Prevalon boot for use at this facility. Fortunately she is not having significant pain. 01/06/17 on evaluation today patient's right lateral ankle wound appears to be doing fairly well. There was some Slough covering although there is no evidence of infection and she does not seem to have significant pain. She is pleased with how this is progressing compared to where it was in the beginning but wishes it would completely close. 01/20/17 on evaluation today patient's wound appears to be doing better. She did have some Slough covering the wound but overall there did not appear to be a significant amount of erythema and definitely no read streaking from the wound itself. She also did not have significant pain. ===== Old notes: 61 year old patient who is known to be a diabetic and has end-stage renal disease has had several comorbidities including coronary artery disease, hypertension, hyperlipidemia, pancreatitis, anemia, previous history of hysterectomy, [name], [name] ([id number]) cholecystectomy, left-sided salivary gland excision, bilateral cataract surgery,Peritoneal dialysis catheter, hemodialysis catheter. the area on the back has also been caused by instant pressure she  used to sleep on a recliner all day and has significant kyphoscoliosis. As far as the wound on her right lower extremity she's not sure how this blister occurred but she thought it has been there for about 2 weeks. No recent blood investigations available and no recent hemoglobin A1c. 12/30/2014 -- she is an assisted living facility but I believe the nurses that have not followed instructions as she had some cream applied on her back and there was a different dressing. Last week she's had a AV fistula placed on her left forearm. 01/13/2015 -- she has had some localized infection at the port site and she's been on doxycycline for this. 02/05/2015 - he has developed a small blister on her right lower extremity. 02/10/2015 -- she has developed another small blister on her right anterior chest wall in the area where she's had tape for her dialysis access. this may just be injury caused by a tape burn. She had a dermatology opinion and they have taken a biopsy of her skin. She also had a left brachial AV fistula placed this week. ========= Electronic Signature(s) Signed: 01/20/2017 4:47:40 PM By: Worthy Keeler PA-C Entered By: Worthy Keeler on 01/20/2017 10:40:11 Leinbach, [name] ([id number]) -------------------------------------------------------------------------------- Physical Exam Details Patient Name: Lagman, [name]. Date of Service: 01/20/2017 9:45 AM Medical Record Number: [id number] Patient Account Number: [id number] Date of Birth/Sex: 1956/01/06 (61 y.o. Female) Treating RN: Carolyne Fiscal, Debi Primary Care Provider: [name] Other Clinician: Referring Provider: [name] Treating Provider/Extender: STONE III, HOYT [name]: 14 Constitutional Obese and well-hydrated in no acute distress. Respiratory normal breathing without difficulty. Cardiovascular no clubbing, cyanosis, significant edema, <3 sec cap refill. Psychiatric this patient is able to make decisions and  demonstrates good insight into disease process. Alert and Oriented x 3. pleasant and cooperative. Notes Was Slough noted in the wound bed which required sharp debridement today and she tolerated this with minimal discomfort and she had a much improved granulation bed post procedure. Electronic Signature(s) Signed: 01/20/2017 4:47:40 PM By: Worthy Keeler PA-C Entered By: Worthy Keeler on 01/20/2017 10:40:50 [place], [name] ([id number]) -------------------------------------------------------------------------------- Physician  Orders Details Patient Name: ARIEL, DIMITRI. Date of Service: 01/20/2017 9:45 AM Medical Record Number: [id number] Patient Account Number: [id number] Date of Birth/Sex: Apr 07, 1956 (61 y.o. Female) Treating RN: Carolyne Fiscal, Debi Primary Care Provider: [name] Other Clinician: Referring Provider: [name] Treating Provider/Extender: Melburn Hake, HOYT [name]: 14 Verbal / Phone Orders: Yes Clinician: Carolyne Fiscal, Debi Read Back and Verified: Yes Diagnosis Coding ICD-10 Coding Code Description E11.621 Type 2 diabetes mellitus with foot ulcer N18.6 End stage renal disease Z99.2 Dependence on renal dialysis L97.312 Non-pressure chronic ulcer of right ankle with fat layer exposed Wound Cleansing Wound #10 Right,Lateral Malleolus o Clean wound with Normal Saline. o May Shower, gently pat wound dry prior to applying new dressing. Anesthetic Wound #10 Right,Lateral Malleolus o Topical Lidocaine 4% cream applied to wound bed prior to debridement Primary Wound Dressing Wound #10 Right,Lateral Malleolus o Prisma Ag - moisten with saline Secondary Dressing Wound #10 Right,Lateral Malleolus o Dry Gauze o Boardered Foam Dressing Dressing Change Frequency Wound #10 Right,Lateral Malleolus o Change dressing every day. Follow-up Appointments Wound #10 Right,Lateral Malleolus o Return Appointment in 1 week. Edema Control Snelling, CYANN VENTI.  ([id number]) Wound #10 Right,Lateral Malleolus o Patient to wear own compression stockings Notes We will continue with the Current wound care measures for the next week and then will see her for reevaluation at that point. If anything worsens in the interim nursing staff from the facility will contact us for additional recommendations and or the patient can contact us herself. Otherwise hopefully this will continue to improve. Electronic Signature(s) Signed: 01/20/2017 4:47:40 PM By: Worthy Keeler PA-C Entered By: Worthy Keeler on 01/20/2017 10:41:33 Knudsen, [name] ([id number]) -------------------------------------------------------------------------------- Problem List Details Patient Name: SHAQUANDA, GRAVES. Date of Service: 01/20/2017 9:45 AM Medical Record Number: [id number] Patient Account Number: [id number] Date of Birth/Sex: 1956/02/26 (61 y.o. Female) Treating RN: Carolyne Fiscal, Debi Primary Care Provider: [name] Other Clinician: Referring Provider: [name] Treating Provider/Extender: Melburn Hake, HOYT [name]: 14 Active Problems ICD-10 Encounter Code Description Active Date Diagnosis E11.621 Type 2 diabetes mellitus with foot ulcer 10/10/2016 Yes N18.6 End stage renal disease 10/10/2016 Yes Z99.2 Dependence on renal dialysis 10/10/2016 Yes L97.312 Non-pressure chronic ulcer of right ankle with fat layer 10/10/2016 Yes exposed Inactive Problems Resolved Problems Electronic Signature(s) Signed: 01/20/2017 4:47:40 PM By: Worthy Keeler PA-C Entered By: Worthy Keeler on 01/20/2017 10:39:38 Rua, [name] ([id number]) -------------------------------------------------------------------------------- Progress Note Details [name]. Date of Service: 01/20/2017 9:45 AM Medical Record Number: [id number] Patient Account Number: [id number] Date of Birth/Sex: 06-Mar-1956 (61 y.o. Female) Treating RN: Carolyne Fiscal, Debi Primary Care Provider: [name] Other Clinician: Referring Provider: [name] Treating Provider/Extender: Melburn Hake, HOYT [name]: 14 Subjective Chief Complaint Information obtained from Patient Patients presents for treatment of an open diabetic ulcer right lateral ankle History of Present Illness (HPI) The following HPI elements were documented for the patient's wound: Location: right lateral ankle Quality: Patient reports No Pain. Severity: Patient states wound (s) are getting better. Duration: Patient has had the wound for > 1 months prior to seeking treatment at the wound center Context: The wound appeared gradually over time, and she thinks she might have hit it with a walker Modifying Factors: Patient is currently on renal dialysis and receives treatments 3 times weekly 61 year old patient known to our practice from several previous visits for various problems now comes back with an ulcerated area on her right lateral malleolus which she's had  for about a month and she thinks she may have hit it with her walker. No other problems and no x-ray of this has been taken. She has a past medical history of atrial fibrillation, carotid artery disease, CHF, chronic kidney disease on hemodialysis with a functioning AV fistula seen by Dr. Corene Cornea dew, diabetes mellitus with peripheral neuropathy, pancreatitis and is status post abdominal hysterectomy, AV fistula placement in June 2016, back surgery, cholecystectomy, coronary angioplasty,several surgeries for AV fistula, and dialysis catheters. She has never been a smoker. 10/17/2016 -- had a right ankle x-ray which showed no acute pathology of the right ankle. Arterial vascular calcifications are noted and there is generalized osteopenia and demineralization 11/11/16 Patient appears to be doing fairly well with regard to her right ankle wound. She has been tolerating the dressing changes with the Santyl that does seem to have loosened up the slough a  bit. She has also been tolerating the debridement's when they have been performed. She tells me that she does tend to lay on her right side and this seems to be aggravating the issue. She does not have a Prevalon boot for use at this facility. Fortunately she is not having significant pain. 01/06/17 on evaluation today patient's right lateral ankle wound appears to be doing fairly well. There was some Slough covering although there is no evidence of infection and she does not seem to have significant pain. She is pleased with how this is progressing compared to where it was in the beginning but wishes it would completely close. 01/20/17 on evaluation today patient's wound appears to be doing better. She did have some Slough covering the wound but overall there did not appear to be a significant amount of erythema and definitely no read Droege, [name] S. ([id number]) streaking from the wound itself. She also did not have significant pain. ===== Old notes: 61 year old patient who is known to be a diabetic and has end-stage renal disease has had several comorbidities including coronary artery disease, hypertension, hyperlipidemia, pancreatitis, anemia, previous history of hysterectomy, cholecystectomy, left-sided salivary gland excision, bilateral cataract surgery,Peritoneal dialysis catheter, hemodialysis catheter. the area on the back has also been caused by instant pressure she used to sleep on a recliner all day and has significant kyphoscoliosis. As far as the wound on her right lower extremity she's not sure how this blister occurred but she thought it has been there for about 2 weeks. No recent blood investigations available and no recent hemoglobin A1c. 12/30/2014 -- she is an assisted living facility but I believe the nurses that have not followed instructions as she had some cream applied on her back and there was a different dressing. Last week she's had a AV fistula placed on her left  forearm. 01/13/2015 -- she has had some localized infection at the port site and she's been on doxycycline for this. 02/05/2015 - he has developed a small blister on her right lower extremity. 02/10/2015 -- she has developed another small blister on her right anterior chest wall in the area where she's had tape for her dialysis access. this may just be injury caused by a tape burn. She had a dermatology opinion and they have taken a biopsy of her skin. She also had a left brachial AV fistula placed this week. ========= Objective Constitutional Obese and well-hydrated in no acute distress. Vitals Time Taken: 10:06 AM, Height: 61 in, Weight: 160 lbs, BMI: 30.2, Temperature: 97.8 F, Pulse: 66 bpm, Respiratory Rate: 20 breaths/min, Blood Pressure: 111/49 mmHg.  Respiratory normal breathing without difficulty. Cardiovascular no clubbing, cyanosis, significant edema, Psychiatric this patient is able to make decisions and demonstrates good insight into disease process. Alert and Kopper, [name] ([id number]) Oriented x 3. pleasant and cooperative. General Notes: Was Slough noted in the wound bed which required sharp debridement today and she tolerated this with minimal discomfort and she had a much improved granulation bed post procedure. Integumentary (Hair, Skin) Wound #10 status is Open. Original cause of wound was Trauma. The wound is located on the Right,Lateral Malleolus. The wound measures 0.4cm length x 0.4cm width x 0.1cm depth; 0.126cm^2 area and 0.013cm^3 volume. There is Fat Layer (Subcutaneous Tissue) Exposed exposed. There is no tunneling or undermining noted. There is a medium amount of purulent drainage noted. The wound margin is flat and intact. There is small (1-33%) pink granulation within the wound bed. There is a large (67-100%) amount of necrotic tissue within the wound bed including Eschar and Adherent Slough. The periwound skin appearance exhibited: Erythema. The  periwound skin appearance did not exhibit: Callus, Crepitus, Excoriation, Induration, Rash, Scarring, Dry/Scaly, Maceration, Atrophie Blanche, Cyanosis, Ecchymosis, Hemosiderin Staining, Mottled, Pallor, Rubor. The surrounding wound skin color is noted with erythema which is circumferential. Periwound temperature was noted as No Abnormality. The periwound has tenderness on palpation. Assessment Active Problems ICD-10 E11.621 - Type 2 diabetes mellitus with foot ulcer N18.6 - End stage renal disease Z99.2 - Dependence on renal dialysis L97.312 - Non-pressure chronic ulcer of right ankle with fat layer exposed Procedures Wound #10 Pre-procedure diagnosis of Wound #10 is a Trauma, Other located on the Right,Lateral Malleolus . There was a Skin/Subcutaneous Tissue Debridement ([id number]) debridement with total area of 0.16 sq cm performed by STONE III, HOYT E., PA-C. with the following instrument(s): Curette to remove Viable and Non-Viable tissue/material including Exudate, Fibrin/Slough, and Subcutaneous after achieving pain control using Lidocaine 2% Topical Liquid. A time out was conducted at 10:17, prior to the start of the procedure. A Minimum amount of bleeding was controlled with Pressure. The procedure was tolerated well with a pain level of 0 throughout and a pain level of 0 following the procedure. Post Debridement Measurements: 0.4cm length x 0.4cm width x 0.2cm depth; 0.025cm^3 volume. Character of Wound/Ulcer Post Debridement requires further debridement. Post procedure Diagnosis Wound #10: Same as Pre-Procedure Stutz, Kemonie S. ([id number]) Plan Wound Cleansing: Wound #10 Right,Lateral Malleolus: Clean wound with Normal Saline. May Shower, gently pat wound dry prior to applying new dressing. Anesthetic: Wound #10 Right,Lateral Malleolus: Topical Lidocaine 4% cream applied to wound bed prior to debridement Primary Wound Dressing: Wound #10 Right,Lateral Malleolus: Prisma  Ag - moisten with saline Secondary Dressing: Wound #10 Right,Lateral Malleolus: Dry Gauze Boardered Foam Dressing Dressing Change Frequency: Wound #10 Right,Lateral Malleolus: Change dressing every day. Follow-up Appointments: Wound #10 Right,Lateral Malleolus: Return Appointment in 1 week. Edema Control: Wound #10 Right,Lateral Malleolus: Patient to wear own compression stockings General Notes: We will continue with the Current wound care measures for the next week and then will see her for reevaluation at that point. If anything worsens in the interim nursing staff from the facility will contact us for additional recommendations and or the patient can contact us herself. Otherwise hopefully this will continue to improve. Electronic Signature(s) Signed: 01/20/2017 4:47:40 PM By: Worthy Keeler PA-C Entered By: Worthy Keeler on 01/20/2017 10:41:58 Kato, [name] ([id number]) -------------------------------------------------------------------------------- SuperBill Details [name]. Date of Service: 01/20/2017 Medical Record Number: [id number] Patient Account Number: [id number] Date  of Birth/Sex: [birth year] (61 y.o. Female) Treating RN: Carolyne Fiscal, Debi Primary Care Provider: [name] Other Clinician: Referring Provider: [name] Treating Provider/Extender: Melburn Hake, HOYT [name]: 14 Diagnosis Coding ICD-10 Codes Code Description E11.621 Type 2 diabetes mellitus with foot ulcer N18.6 End stage renal disease Z99.2 Dependence on renal dialysis L97.312 Non-pressure chronic ulcer of right ankle with fat layer exposed Facility Procedures CPT4 Code Description: [id number] - DEB SUBQ TISSUE 20 SQ CM/< ICD-10 Description Diagnosis L97.312 Non-pressure chronic ulcer of right ankle with fat Modifier: layer expose Quantity: 1 d Physician Procedures CPT4 Code Description: [id number] - WC PHYS SUBQ TISS 20 SQ CM ICD-10 Description Diagnosis  L97.312 Non-pressure chronic ulcer of right ankle with fat Modifier: layer expose Quantity: 1 d Electronic Signature(s) Signed: 01/20/2017 4:47:40 PM By: Worthy Keeler PA-C Entered By: Worthy Keeler on 01/20/2017 10:42:15

## 2017-01-21 NOTE — Progress Notes (Signed)
MANDISA, PERSINGER ([id number]) Visit Report for 01/20/2017 Arrival Information Details [name], [name]. Date of Service: 01/20/2017 9:45 AM Medical Record Number: [id number] Patient Account Number: [id number] Date of Birth/Sex: [birth year] (61 y.o. Female) Treating RN: Carolyne Fiscal, Debi Primary Care Danen Lapaglia: [name] Other Clinician: Referring Lenell Lama: [name] Treating Brandell Maready/Extender: Melburn Hake, HOYT [name]: 14 Visit Information History Since Last Visit All ordered tests and consults were completed: No Patient Arrived: Wheel Chair Added or deleted any medications: No Arrival Time: 10:03 Any new allergies or adverse reactions: No Accompanied By: self Had a fall or experienced change in No Transfer Assistance: EasyPivot Patient activities of daily living that may affect Lift risk of falls: Patient Identification Verified: Yes Signs or symptoms of abuse/neglect since last No Secondary Verification Process Yes visito Completed: Hospitalized since last visit: No Patient Requires Transmission- No Has Dressing in Place as Prescribed: Yes Based Precautions: Pain Present Now: No Patient Has Alerts: Yes Patient Alerts: Patient on Blood Thinner Electronic Signature(s) Signed: 01/20/2017 4:17:34 PM By: Alric Quan Entered By: Alric Quan on 01/20/2017 10:03:58 [name], [name] ([id number]) -------------------------------------------------------------------------------- Encounter Discharge Information Details Patient Name: [name], [name] Date of Service: 01/20/2017 9:45 AM Medical Record Number: [id number] Patient Account Number: [id number] Date of Birth/Sex: 06/16/1956 (61 y.o. Female) Treating RN: Carolyne Fiscal, Debi Primary Care Shauniece Kwan: [name] Other Clinician: Referring Lamica Mccart: [name] Treating Glendoris Nodarse/Extender: Melburn Hake, HOYT [name]: 14 Encounter Discharge Information Items Discharge Pain Level: 0 Discharge  Condition: Stable Ambulatory Status: Wheelchair Discharge Destination: Nursing Home Transportation: Other Accompanied By: self Schedule Follow-up Appointment: Yes Medication Reconciliation completed No and provided to Patient/Care Shauniece Kwan: Provided on Clinical Summary of Care: 01/20/2017 Form Type Recipient Paper Patient LD Electronic Signature(s) Signed: 01/20/2017 10:30:34 AM By: Ruthine Dose Entered By: Ruthine Dose on 01/20/2017 10:30:33 [name], [name] ([id number]) -------------------------------------------------------------------------------- Lower Extremity Assessment Details Patient Name: Valencia, [name] Date of Service: 01/20/2017 9:45 AM Medical Record Number: [id number] Patient Account Number: [id number] Date of Birth/Sex: 07-21-1955 (61 y.o. Female) Treating RN: Carolyne Fiscal, Debi Primary Care Wyoma Genson: [name] Other Clinician: Referring Dreux Mcgroarty: [name] Treating Marene Gilliam/Extender: Melburn Hake, HOYT [name]: 14 Vascular Assessment Pulses: Dorsalis Pedis Palpable: [Right:Yes] Posterior Tibial Extremity colors, hair growth, and conditions: Extremity Color: [Right:Normal] Temperature of Extremity: [Right:Warm] Toe Nail Assessment Left: Right: Thick: No Discolored: No Deformed: No Improper Length and Hygiene: No Electronic Signature(s) Signed: 01/20/2017 4:17:34 PM By: Alric Quan Entered By: Alric Quan on 01/20/2017 10:17:16 [name], [name] ([id number]) -------------------------------------------------------------------------------- Multi Wound Chart Details Patient Name: MAUQJ, [name] Date of Service: 01/20/2017 9:45 AM Medical Record Number: [id number] Patient Account Number: [id number] Date of Birth/Sex: [birth year] (61 y.o. Female) Treating RN: Carolyne Fiscal, Debi Primary Care Ennifer Harston: [name] Other Clinician: Referring Frazer [place]: [name] Treating Magnum Lunde/Extender: STONE III, HOYT [name]: 14 Vital  Signs Height(in): 61 Pulse(bpm): 66 Weight(lbs): 160 Blood Pressure 111/49 (mmHg): Body Mass Index(BMI): 30 Temperature(F): 97.8 Respiratory Rate 20 (breaths/min): Photos: [10:No Photos] [N/A:N/A] Wound Location: [10:Right Malleolus - Lateral N/A] Wounding Event: [10:Trauma] [N/A:N/A] Primary Etiology: [10:Trauma, Other] [N/A:N/A] Comorbid History: [10:Glaucoma, Anemia, Coronary Artery Disease, Hypertension, Type II Diabetes, End Stage Renal Disease, Osteoarthritis, Neuropathy] [N/A:N/A] Date Acquired: [10:09/12/2016] [N/A:N/A] Weeks of Treatment: [10:14] [N/A:N/A] Wound Status: [10:Open] [N/A:N/A] Measurements L x W x D 0.4x0.4x0.1 [N/A:N/A] (cm) Area (cm) : [10:0.126] [N/A:N/A] Volume (cm) : [10:0.013] [N/A:N/A] % Reduction in Area: [10:55.50%] [N/A:N/A] % Reduction in Volume: 77.20% [N/A:N/A] Classification: [10:Full Thickness Without Exposed Support Structures] [N/A:N/A] HBO Classification: [10:Grade  1] [N/A:N/A] Exudate Amount: [10:Medium] [N/A:N/A] Exudate Type: [10:Purulent] [N/A:N/A] Exudate Color: [10:yellow, brown, green] [N/A:N/A] Wound Margin: [10:Flat and Intact] [N/A:N/A] Granulation Amount: [10:Small (1-33%)] [N/A:N/A] Granulation Quality: [10:Pink] [N/A:N/A] Necrotic Amount: [10:Large (67-100%)] [N/A:N/A] Necrotic Tissue: Eschar, Adherent Slough N/A N/A Exposed Structures: Fat Layer (Subcutaneous N/A N/A Tissue) Exposed: Yes Fascia: No Tendon: No Muscle: No Joint: No Bone: No Epithelialization: None N/A N/A Periwound Skin Texture: Excoriation: No N/A N/A Induration: No Callus: No Crepitus: No Rash: No Scarring: No Periwound Skin Maceration: No N/A N/A Moisture: Dry/Scaly: No Periwound Skin Color: Erythema: Yes N/A N/A Atrophie Blanche: No Cyanosis: No Ecchymosis: No Hemosiderin Staining: No Mottled: No Pallor: No Rubor: No Erythema Location: Circumferential N/A N/A Temperature: No Abnormality N/A N/A Tenderness on Yes N/A  N/A Palpation: Wound Preparation: Ulcer Cleansing: N/A N/A Rinsed/Irrigated with Saline Topical Anesthetic Applied: Other: lidocaine 4% Treatment Notes Electronic Signature(s) Signed: 01/20/2017 4:17:34 PM By: Alric Quan Entered By: Alric Quan on 01/20/2017 10:17:30 [name], [name] ([id number]) -------------------------------------------------------------------------------- [place] Details Patient Name: [name] Cornwall. Date of Service: 01/20/2017 9:45 AM Medical Record Number: [id number] Patient Account Number: [id number] Date of Birth/Sex: 01-06-1956 (61 y.o. Female) Treating RN: Carolyne Fiscal, Debi Primary Care Henya Aguallo: [name] Other Clinician: Referring Ewin Rehberg: [name] Treating Jalysa Swopes/Extender: Melburn Hake, HOYT [name]: 14 Active Inactive ` Abuse / Safety / Falls / Self Care Management Nursing Diagnoses: Potential for falls Goals: Patient will remain injury free Date Initiated: 10/10/2016 Target Resolution Date: 12/23/2016 Goal Status: Active Interventions: Assess fall risk on admission and as needed Notes: ` Nutrition Nursing Diagnoses: Potential for alteratiion in Nutrition/Potential for imbalanced nutrition Goals: Patient/caregiver agrees to and verbalizes understanding of need to use nutritional supplements and/or vitamins as prescribed Date Initiated: 10/10/2016 Target Resolution Date: 10/21/2016 Goal Status: Active Interventions: Assess patient nutrition upon admission and as needed per policy Notes: ` Orientation to the Wound Care Program Nursing Diagnoses: Knowledge deficit related to the wound healing center program Ditullio, [name] ([id number]) Goals: Patient/caregiver will verbalize understanding of the [place] Date Initiated: 10/10/2016 Target Resolution Date: 12/23/2016 Goal Status: Active Interventions: Provide education on orientation to the wound center Notes: ` Wound/Skin  Impairment Nursing Diagnoses: Impaired tissue integrity Goals: Patient/caregiver will verbalize understanding of skin care regimen Date Initiated: 10/10/2016 Target Resolution Date: 12/23/2016 Goal Status: Active Ulcer/skin breakdown will have a volume reduction of 30% by week 4 Date Initiated: 10/10/2016 Target Resolution Date: 12/23/2016 Goal Status: Active Ulcer/skin breakdown will have a volume reduction of 50% by week 8 Date Initiated: 10/10/2016 Target Resolution Date: 12/23/2016 Goal Status: Active Ulcer/skin breakdown will have a volume reduction of 80% by week 12 Date Initiated: 10/10/2016 Target Resolution Date: 12/23/2016 Goal Status: Active Ulcer/skin breakdown will heal within 14 weeks Date Initiated: 10/10/2016 Target Resolution Date: 12/23/2016 Goal Status: Active Interventions: Assess patient/caregiver ability to obtain necessary supplies Assess patient/caregiver ability to perform ulcer/skin care regimen upon admission and as needed Assess ulceration(s) every visit Notes: Electronic Signature(s) Signed: 01/20/2017 4:17:34 PM By: Alric Quan Entered By: Alric Quan on 01/20/2017 10:17:21 [name], [name] ([id number]) -------------------------------------------------------------------------------- Pain Assessment Details Patient Name: [name] Cornwall. Date of Service: 01/20/2017 9:45 AM Medical Record Number: [id number] Patient Account Number: [id number] Date of Birth/Sex: 07-29-1955 (61 y.o. Female) Treating RN: Carolyne Fiscal, Debi Primary Care Lorren Rossetti: [name] Other Clinician: Referring Anu Stagner: [name] Treating Tennelle Taflinger/Extender: Melburn Hake, HOYT [name]: 14 Active Problems Location of Pain Severity and Description of Pain Patient Has Paino No Site Locations With  Dressing Change: No Pain Management and Medication Current Pain Management: Electronic Signature(s) Signed: 01/20/2017 4:17:34 PM By: Alric Quan Entered By: Alric Quan  on 01/20/2017 10:06:06 [name], [name] ([id number]) -------------------------------------------------------------------------------- Patient/Caregiver Education Details Patient Name: [name] Cornwall. Date of Service: 01/20/2017 9:45 AM Medical Record Number: [id number] Patient Account Number: [id number] Date of Birth/Gender: 1955/09/12 (61 y.o. Female) Treating RN: Carolyne Fiscal, Debi Primary Care Physician: [name] Other Clinician: Referring Physician: [name] Treating Physician/Extender: Sharalyn Ink in Treatment: 14 Education Assessment Education Provided To: Patient Education Topics Provided Wound/Skin Impairment: Handouts: Other: chnage dressing as ordered Methods: Demonstration, Explain/Verbal Responses: State content correctly Electronic Signature(s) Signed: 01/20/2017 4:17:34 PM By: Alric Quan Entered By: Alric Quan on 01/20/2017 10:30:03 [name], [name] ([id number]) -------------------------------------------------------------------------------- Wound Assessment Details Patient Name: Lingle, [name] Date of Service: 01/20/2017 9:45 AM Medical Record Number: [id number] Patient Account Number: [id number] Date of Birth/Sex: June 03, 1956 (61 y.o. Female) Treating RN: Carolyne Fiscal, Debi Primary Care Kolbie Clarkston: [name] Other Clinician: Referring Kaiyon Hynes: [name] Treating Saja Bartolini/Extender: STONE III, HOYT [name]: 14 Wound Status Wound Number: 10 Primary Trauma, Other Etiology: Wound Location: Right Malleolus - Lateral Wound Open Wounding Event: Trauma Status: Date Acquired: 09/12/2016 Comorbid Glaucoma, Anemia, Coronary Artery Weeks Of Treatment: 14 History: Disease, Hypertension, Type II Clustered Wound: No Diabetes, End Stage Renal Disease, Osteoarthritis, Neuropathy Photos Photo Uploaded By: Alric Quan on 01/20/2017 14:20:24 Wound Measurements Length: (cm) 0.4 Width: (cm) 0.4 Depth: (cm) 0.1 Area: (cm)  0.126 Volume: (cm) 0.013 % Reduction in Area: 55.5% % Reduction in Volume: 77.2% Epithelialization: None Tunneling: No Undermining: No Wound Description Full Thickness Without Classification: Exposed Support Structures Diabetic Severity Grade 1 (Wagner): Wound Margin: Flat and Intact Exudate Amount: Medium Exudate Type: Purulent Exudate Color: yellow, brown, green Foul Odor After Cleansing: No Slough/Fibrino Yes Wound Bed Ahlgren, [name] ([id number]) Granulation Amount: Small (1-33%) Exposed Structure Granulation Quality: Pink Fascia Exposed: No Necrotic Amount: Large (67-100%) Fat Layer (Subcutaneous Tissue) Exposed: Yes Necrotic Quality: Eschar, Adherent Slough Tendon Exposed: No Muscle Exposed: No Joint Exposed: No Bone Exposed: No Periwound Skin Texture Texture Color No Abnormalities Noted: No No Abnormalities Noted: No Callus: No Atrophie Blanche: No Crepitus: No Cyanosis: No Excoriation: No Ecchymosis: No Induration: No Erythema: Yes Rash: No Erythema Location: Circumferential Scarring: No Hemosiderin Staining: No Mottled: No Moisture Pallor: No No Abnormalities Noted: No Rubor: No Dry / Scaly: No Maceration: No Temperature / Pain Temperature: No Abnormality Tenderness on Palpation: Yes Wound Preparation Ulcer Cleansing: Rinsed/Irrigated with Saline Topical Anesthetic Applied: Other: lidocaine 4%, Treatment Notes Wound #10 (Right, Lateral Malleolus) 1. Cleansed with: Clean wound with Normal Saline 2. Anesthetic Topical Lidocaine 4% cream to wound bed prior to debridement 3. Peri-wound Care: Skin Prep 4. Dressing Applied: Prisma Ag 5. Secondary Dressing Applied Bordered Foam Dressing Dry Gauze Electronic Signature(s) Signed: 01/20/2017 4:17:34 PM By: Alric Quan Entered By: Alric Quan on 01/20/2017 10:08:47 [name], [name] ([id number]) [place], [name]  ([id number]) -------------------------------------------------------------------------------- [place] Details [name], 37. Date of Service: 01/20/2017 9:45 AM Medical Record Number: [id number] Patient Account Number: [id number] Date of Birth/Sex: 1955/08/19 (61 y.o. Female) Treating RN: Carolyne Fiscal, Debi Primary Care Vadhir Mcnay: [name] Other Clinician: Referring Caylen Kuwahara: [name] Treating Kimie Pidcock/Extender: STONE III, HOYT [name]: 14 Vital Signs Time Taken: 10:06 Temperature (F): 97.8 Height (in): 61 Pulse (bpm): 66 Weight (lbs): 160 Respiratory Rate (breaths/min): 20 Body Mass Index (BMI): 30.2 Blood Pressure (mmHg): 111/49 Reference Range: 80 - 120 mg / dl Electronic Signature(s)  Signed: 01/20/2017 4:17:34 PM By: Alric Quan Entered By: Alric Quan on 01/20/2017 10:06:23

## 2017-01-27 ENCOUNTER — Encounter: Payer: Medicare Other | Admitting: Physician Assistant

## 2017-01-27 DIAGNOSIS — E11621 Type 2 diabetes mellitus with foot ulcer: Secondary | ICD-10-CM | POA: Diagnosis not present

## 2017-01-29 NOTE — Progress Notes (Signed)
BRYONY, KAMAN ([id number]) Visit Report for 01/27/2017 Arrival Information Details [name], [name]. Date of Service: 01/27/2017 8:45 AM Medical Record Number: [id number] Patient Account Number: [id number] Date of Birth/Sex: 09/13/1955 (61 y.o. Female) Treating RN: [name] Primary Care [name]: [name] Other Clinician: Referring [name]: [name] Treating [name]/Extender: [name], [name] [name]: 15 Visit Information History Since Last Visit All ordered tests and consults were completed: No Patient Arrived: Wheel Chair Added or deleted any medications: No Arrival Time: 08:40 Any new allergies or adverse reactions: No Accompanied By: self Had a fall or experienced change in No Transfer Assistance: EasyPivot Patient activities of daily living that may affect Lift risk of falls: Patient Identification Verified: Yes Signs or symptoms of abuse/neglect since last No Secondary Verification Process Yes visito Completed: Hospitalized since last visit: No Patient Requires Transmission- No Has Dressing in Place as Prescribed: Yes Based Precautions: Pain Present Now: No Patient Has Alerts: Yes Patient Alerts: Patient on Blood Thinner Electronic Signature(s) Signed: 01/27/2017 4:14:11 PM By: [name] Entered By: [name] on 01/27/2017 08:43:48 Blandon, [name] ([id number]) -------------------------------------------------------------------------------- Encounter Discharge Information Details Patient Name: QXIHW, [name] Date of Service: 01/27/2017 8:45 AM Medical Record Number: [id number] Patient Account Number: [id number] Date of Birth/Sex: 04/30/1956 (61 y.o. Female) Treating RN: [name] Primary Care Charlottie Peragine: [name] Other Clinician: Referring Helyn Schwan: [name] Treating Zaylin Pistilli/Extender: [name], [name] [name]: 15 Encounter Discharge Information Items Discharge Pain Level: 0 Discharge  Condition: Stable Ambulatory Status: Wheelchair Discharge Destination: Nursing Home Transportation: Other Accompanied By: self Schedule Follow-up Appointment: Yes Medication Reconciliation completed and provided to Patient/Care No Cherylee Rawlinson: Provided on Clinical Summary of Care: 01/27/2017 Form Type Recipient Paper Patient LD Electronic Signature(s) Signed: 01/27/2017 4:14:11 PM By: [name] Previous Signature: 01/27/2017 9:26:08 AM Version By: [name] Entered By: [name] on 01/27/2017 09:34:12 [name] ([id number]) -------------------------------------------------------------------------------- General Visit Notes Details Patient Name: [name], [name] Date of Service: 01/27/2017 8:45 AM Medical Record Number: [id number] Patient Account Number: [id number] Date of Birth/Sex: May 06, 1956 (61 y.o. Female) Treating RN: [name] Primary Care Kendrick Haapala: [name] Other Clinician: Referring Nevin [name]: [name] Treating Dazhane Villagomez/Extender: [name], [name] [name]: 15 Notes Made [name] aware of BP later in visit we rechecked pts BP and it was 136/61. Then [name] wanted pt to sit for 60 seconds and then stand up and take her BP again and her BP was 118/46. Pt also has an area on her right medical foot that somehow was injured and there were steri-strips on that area. It was clean, dry, and intact. The nursing home is managing this area. Electronic Signature(s) Signed: 01/27/2017 4:14:11 PM By: [name] Entered By: [name] on 01/27/2017 09:39:19 [name] ([id number]) -------------------------------------------------------------------------------- Lower Extremity Assessment Details Patient Name: [name], [name] Date of Service: 01/27/2017 8:45 AM Medical Record Number: [id number] Patient Account Number: [id number] Date of Birth/Sex: 1956/01/10 (61 y.o. Female) Treating RN: [name] Primary Care Haizley Cannella: [name] Other Clinician: Referring Yulitza Shorts: [name] Treating Naquan Garman/Extender: [name], [name] [name]: 15 Vascular Assessment Pulses: Dorsalis Pedis Palpable: [Left:Yes] [Right:Yes] Posterior Tibial Extremity colors, hair growth, and conditions: Extremity Color: [Left:Normal] [Right:Normal] Temperature of Extremity: [Left:Warm] [Right:Warm] Capillary Refill: [Left:< 3 seconds] Toe Nail Assessment Left: Right: Thick: No No Discolored: No No Deformed: No No Improper Length and Hygiene: No No Electronic Signature(s) Signed: 01/27/2017 4:14:11 PM By: [name] Entered By: [name] on 01/27/2017 08:59:49 [name], [name] -------------------------------------------------------------------------------- Multi Wound Chart Details Patient Name: [name], [name] Date of Service: 01/27/2017 8:45 AM Medical Record Number: [id number] Patient Account Number: [id number] Date of Birth/Sex: 03/11/1956 (61 y.o. 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] Secondary Etiology: [10:N/A] [11:Trauma, Other] [N/A:N/A] Comorbid History: [10:Glaucoma, Anemia, Coronary Artery Disease, Coronary Artery Disease, Hypertension, Type II Diabetes, End Stage Renal Disease, Osteoarthritis, Neuropathy Osteoarthritis, Neuropathy] [11:Glaucoma, Anemia, Hypertension, Type II  Diabetes, End Stage Renal  Disease,] [N/A:N/A] Date Acquired: [10:09/12/2016] [11:01/22/2017] [N/A:N/A] Weeks of Treatment: [10:15] [11:0] [N/A:N/A] Wound Status: [10:Open] [11:Open] [N/A:N/A] Measurements L x W x D 0.4x0.4x0.3 [11:5x2.8x0.1] [N/A:N/A] (cm) Area (cm) : [10:0.126] [11:10.996] [N/A:N/A] Volume (cm) : [10:0.038] [11:1.1] [N/A:N/A] % Reduction in Area: [10:55.50%] [11:N/A] [N/A:N/A] % Reduction in Volume: 33.30% [11:N/A] [N/A:N/A] Classification: [10:Full Thickness Without Exposed Support Structures] [11:Grade 1] [N/A:N/A] HBO Classification: [10:Grade 1] [11:N/A] [N/A:N/A] Exudate Amount: [10:Large] [11:Large] [N/A:N/A] Exudate Type: [10:Serous] [11:Serosanguineous] [N/A:N/A] Exudate Color: [10:amber] [11:red, brown] [N/A:N/A] Wound Margin: [10:Flat and Intact] [11:Flat and Intact] [N/A:N/A] Granulation Amount: [10:Medium (34-66%)] [11:Large (67-100%)] [N/A:N/A] Granulation Quality: Pink Red N/A Necrotic Amount: Medium (34-66%) None Present (0%) N/A Exposed Structures: Fat Layer (Subcutaneous N/A N/A Tissue) Exposed: Yes Fascia: No Tendon: No Muscle: No Joint: No Bone: No Epithelialization: None None N/A Periwound Skin Texture: Excoriation: No No Abnormalities Noted N/A Induration: No Callus: No Crepitus: No Rash: No Scarring: No Periwound Skin Maceration: No No Abnormalities Noted N/A Moisture: Dry/Scaly: No Periwound Skin Color: Erythema: Yes No Abnormalities Noted N/A Atrophie Blanche: No Cyanosis: No Ecchymosis: No Hemosiderin Staining: No Mottled: No Pallor: No Rubor: No Erythema Location: Circumferential N/A N/A Temperature: No Abnormality No Abnormality N/A Tenderness on Yes No N/A Palpation: Wound Preparation: Ulcer Cleansing: Ulcer Cleansing: N/A Rinsed/Irrigated with Rinsed/Irrigated with Saline Saline Topical Anesthetic Topical Anesthetic Applied: Other: lidocaine Applied: Other: lidocaine 4% 4% Treatment Notes Electronic Signature(s) Signed: 01/27/2017 4:14:11  PM By: [name] Entered By: [name] on 01/27/2017 09:00:10 Clinkscale, [name] ([id number]) -------------------------------------------------------------------------------- [place] Details [name]. Date of Service: 01/27/2017 8:45 AM Medical Record Number: [id number] Patient Account Number: [id number] Date of Birth/Sex: 04-15-1956 (61 y.o. Female) Treating RN: [name] Primary Care Shields Pautz: [name] Other Clinician: Referring Macsen Nuttall: [name] Treating Majel Giel/Extender: [name], [name] [name]: 15 Active Inactive ` Abuse / Safety / Falls / Self Care Management Nursing Diagnoses: Potential for falls Goals: Patient will remain injury free Date Initiated: 10/10/2016 Target Resolution Date: 12/23/2016 Goal Status: Active Interventions: Assess fall risk on admission and as needed Notes: ` Nutrition Nursing Diagnoses: Potential for alteratiion in Nutrition/Potential for imbalanced nutrition Goals: Patient/caregiver agrees to and verbalizes understanding of need to use nutritional supplements and/or vitamins as prescribed Date Initiated: 10/10/2016 Target Resolution Date: 10/21/2016 Goal Status: Active Interventions: Assess patient nutrition upon admission and as needed per policy Notes: ` Orientation to the Wound Care Program Nursing Diagnoses: Knowledge deficit related to the wound healing center program CARYS, MALINA ([id number]) Goals: Patient/caregiver will verbalize understanding of the [place] Date Initiated: 10/10/2016 Target Resolution Date: 12/23/2016 Goal Status: Active Interventions: Provide  education on orientation to the wound center Notes: ` Wound/Skin Impairment Nursing Diagnoses: Impaired tissue integrity Goals: Patient/caregiver will verbalize understanding of skin care regimen Date Initiated: 10/10/2016 Target Resolution Date: 12/23/2016 Goal Status:  Active Ulcer/skin breakdown will have a volume reduction of 30% by week 4 Date Initiated: 10/10/2016 Target Resolution Date: 12/23/2016 Goal Status: Active Ulcer/skin breakdown will have a volume reduction of 50% by week 8 Date Initiated: 10/10/2016 Target Resolution Date: 12/23/2016 Goal Status: Active Ulcer/skin breakdown will have a volume reduction of 80% by week 12 Date Initiated: 10/10/2016 Target Resolution Date: 12/23/2016 Goal Status: Active Ulcer/skin breakdown will heal within 14 weeks Date Initiated: 10/10/2016 Target Resolution Date: 12/23/2016 Goal Status: Active Interventions: Assess patient/caregiver ability to obtain necessary supplies Assess patient/caregiver ability to perform ulcer/skin care regimen upon admission and as needed Assess ulceration(s) every visit Notes: Electronic Signature(s) Signed: 01/27/2017 4:14:11 PM By: [name] Entered By: [name] on 01/27/2017 08:59:59 [name], [name] ([id number]) -------------------------------------------------------------------------------- Pain Assessment Details [name]. Date of Service: 01/27/2017 8:45 AM Medical Record Number: [id number] Patient Account Number: [id number] Date of Birth/Sex: 05-10-1956 (61 y.o. Female) Treating RN: [name] Primary Care Conception Doebler: [name] Other Clinician: Referring Cher Egnor: [name] Treating Kristel Durkee/Extender: [name], [name] [name]: 15 Active Problems Location of Pain Severity and Description of Pain Patient Has Paino No Site Locations With Dressing Change: No Pain Management and Medication Current Pain Management: Electronic Signature(s) Signed: 01/27/2017 4:14:11 PM By: [name] Entered By: [name] on 01/27/2017 08:43:56 Mawson, [name] ([id number]) -------------------------------------------------------------------------------- Patient/Caregiver Education Details [name]. Date  of Service: 01/27/2017 8:45 AM Medical Record Number: [id number] Patient Account Number: [id number] Date of Birth/Gender: Dec 13, 1955 (61 y.o. Female) Treating RN: [name] Primary Care Physician: [name] Other Clinician: Referring Physician: Nicky [name] Treating Physician/Extender: Sharalyn Ink in Treatment: 15 Education Assessment Education Provided To: Patient Education Topics Provided Wound/Skin Impairment: Handouts: Other: change dressing as ordered Methods: Demonstration, Explain/Verbal Responses: State content correctly Electronic Signature(s) Signed: 01/27/2017 4:14:11 PM By: [name] Entered By: [name] on 01/27/2017 09:34:25 Witherow, [name] ([id number]) -------------------------------------------------------------------------------- Wound Assessment Details Patient Name: Villacis, [name] Date of Service: 01/27/2017 8:45 AM Medical Record Number: [id number] Patient Account Number: [id number] Date of Birth/Sex: 02-Sep-1955 (61 y.o. Female) Treating RN: [name] Primary Care Kattia Selley: [name] Other Clinician: Referring Shaterica Mcclatchy: [name] Treating Maricsa Sammons/Extender: STONE III, [name] [name]: 15 Wound Status Wound Number: 10 Primary Trauma, Other Etiology: Wound Location: Right Malleolus - Lateral Wound Open Wounding Event: Trauma Status: Date Acquired: 09/12/2016 Comorbid Glaucoma, Anemia, Coronary Artery Weeks Of Treatment: 15 History: Disease, Hypertension, Type II Clustered Wound: No Diabetes, End Stage Renal Disease, Osteoarthritis, Neuropathy Photos Photo Uploaded By: [name] on 01/27/2017 09:35:31 Wound Measurements Length: (cm) 0.4 Width: (cm) 0.4 Depth: (cm) 0.3 Area: (cm) 0.126 Volume: (cm) 0.038 % Reduction in Area: 55.5% % Reduction in Volume: 33.3% Epithelialization: None Tunneling: No Undermining: No Wound Description Full Thickness Without Classification: Exposed  Support Structures Diabetic Severity Grade 1 (Wagner): Wound Margin: Flat and Intact Exudate Amount: Large Exudate Type: Serous Exudate Color: amber Foul Odor After Cleansing: No Slough/Fibrino Yes Wound Bed [name], [name] ([id number]) Granulation Amount: Medium (34-66%) Exposed Structure Granulation Quality: Pink Fascia Exposed: No Necrotic Amount: Medium (34-66%) Fat Layer (Subcutaneous Tissue) Exposed: Yes Necrotic Quality: Adherent Slough Tendon Exposed: No Muscle Exposed: No Joint Exposed: No Bone Exposed: No Periwound Skin Texture Texture Color No Abnormalities Noted: No No Abnormalities  Noted: No Callus: No Atrophie Blanche: No Crepitus: No Cyanosis: No Excoriation: No Ecchymosis: No Induration: No Erythema: Yes Rash: No Erythema Location: Circumferential Scarring: No Hemosiderin Staining: No Mottled: No Moisture Pallor: No No Abnormalities Noted: No Rubor: No Dry / Scaly: No Maceration: No Temperature / Pain Temperature: No Abnormality Tenderness on Palpation: Yes Wound Preparation Ulcer Cleansing: Rinsed/Irrigated with Saline Topical Anesthetic Applied: Other: lidocaine 4%, Treatment Notes Wound #10 (Right, Lateral Malleolus) 1. Cleansed with: Clean wound with Normal Saline 2. Anesthetic Topical Lidocaine 4% cream to wound bed prior to debridement 3. Peri-wound Care: Skin Prep 4. Dressing Applied: Prisma Ag 5. Secondary Dressing Applied Bordered Foam Dressing Dry Gauze Electronic Signature(s) Signed: 01/27/2017 4:14:11 PM By: [name] Entered By: [name] on 01/27/2017 08:53:32 Vanderhoof, [name] ([id number]) Chriscoe, [name] ([id number]) -------------------------------------------------------------------------------- Wound Assessment Details [name], [name] Date of Service: 01/27/2017 8:45 AM Medical Record Number: [id number] Patient Account Number: [id number] Date of Birth/Sex: 12/22/1955 (61 y.o.  Female) Treating RN: [name] Primary Care Bellanie Matthew: [name] Other Clinician: Referring Kahlan Engebretson: [name] Treating Dawna Jakes/Extender: STONE III, [name] [name]: 15 Wound Status Wound Number: 11 Primary Diabetic Wound/Ulcer of the Lower Etiology: Extremity Wound Location: Right Knee Secondary Trauma, Other Wounding Event: Trauma Etiology: Date Acquired: 01/22/2017 Wound Open Weeks Of Treatment: 0 Status: Clustered Wound: No Comorbid Glaucoma, Anemia, Coronary Artery History: Disease, Hypertension, Type II Diabetes, End Stage Renal Disease, Osteoarthritis, Neuropathy Photos Photo Uploaded By: [name] on 01/27/2017 09:35:32 Wound Measurements Length: (cm) 5 Width: (cm) 2.8 Depth: (cm) 0.1 Area: (cm) 10.996 Volume: (cm) 1.1 % Reduction in Area: % Reduction in Volume: Epithelialization: None Tunneling: No Undermining: No Wound Description Classification: Grade 1 Wound Margin: Flat and Intact Exudate Amount: Large Exudate Type: Serosanguineous Exudate Color: red, brown Foul Odor After Cleansing: No Slough/Fibrino No Wound Bed Granulation Amount: Large (67-100%) Rottmann, [name] ([id number]) Granulation Quality: Red Necrotic Amount: None Present (0%) Periwound Skin Texture Texture Color No Abnormalities Noted: No No Abnormalities Noted: No Moisture Temperature / Pain No Abnormalities Noted: No Temperature: No Abnormality Wound Preparation Ulcer Cleansing: Rinsed/Irrigated with Saline Topical Anesthetic Applied: Other: lidocaine 4%, Treatment Notes Wound #11 (Right Knee) 1. Cleansed with: Clean wound with Normal Saline 2. Anesthetic Topical Lidocaine 4% cream to wound bed prior to debridement 3. Peri-wound Care: Skin Prep 4. Dressing Applied: Xeroform 5. Secondary Dressing Applied Bordered Foam Dressing Dry Gauze Electronic Signature(s) Signed: 01/27/2017 4:14:11 PM By: [name] Entered By: [name]  on 01/27/2017 08:55:27 Vandeventer, [name] ([id number]) -------------------------------------------------------------------------------- Wound Assessment Details [name], [name] Date of Service: 01/27/2017 8:45 AM Medical Record Number: [id number] Patient Account Number: [id number] Date of Birth/Sex: June 01, 1956 (61 y.o. Female) Treating RN: [name] Primary Care Vladislav Axelson: [name] Other Clinician: Referring Lillian Tigges: [name] Treating Afnan Cadiente/Extender: STONE III, [name] [name]: 15 Wound Status Wound Number: 12 Primary Diabetic Wound/Ulcer of the Lower Etiology: Extremity Wound Location: Left Lower Leg - Lateral Wound Open Wounding Event: Blister Status: Date Acquired: 01/25/2017 Comorbid Glaucoma, Anemia, Coronary Artery Weeks Of Treatment: 0 History: Disease, Hypertension, Type II Clustered Wound: No Diabetes, End Stage Renal Disease, Osteoarthritis, Neuropathy Photos Photo Uploaded By: [name] on 01/27/2017 09:36:04 Wound Measurements Length: (cm) 1 Width: (cm) 2.8 Depth: (cm) 0.1 Area: (cm) 2.199 Volume: (cm) 0.22 % Reduction in Area: % Reduction in Volume: Epithelialization: None Tunneling: No Undermining: No Wound Description Classification: Grade 1 Foul Odor Aft Wound Margin: Distinct, outline attached Slough/Fibrin Exudate Amount: Medium Exudate  Type: Serosanguineous Exudate Color: red, brown er Cleansing: No o No Wound Bed Granulation Amount: Large (67-100%) Granulation Quality: Red Necrotic Amount: None Present (0%) [name], [name] ([id number]) Periwound Skin Texture Texture Color No Abnormalities Noted: No No Abnormalities Noted: No Moisture Temperature / Pain No Abnormalities Noted: No Temperature: No Abnormality Tenderness on Palpation: Yes Wound Preparation Ulcer Cleansing: Rinsed/Irrigated with Saline Topical Anesthetic Applied: Other: lidocaine 4%, Treatment Notes Wound #12 (Left, Lateral  Lower Leg) 1. Cleansed with: Clean wound with Normal Saline 2. Anesthetic Topical Lidocaine 4% cream to wound bed prior to debridement 3. Peri-wound Care: Skin Prep 5. Secondary Dressing Applied Bordered Foam Dressing Dry Gauze Electronic Signature(s) Signed: 01/27/2017 4:14:11 PM By: [name] Entered By: [name] on 01/27/2017 09:03:01 [name], [name] ([id number]) -------------------------------------------------------------------------------- Vitals Details [name]. Date of Service: 01/27/2017 8:45 AM Medical Record Number: [id number] Patient Account Number: [id number] Date of Birth/Sex: 11-Oct-1955 (61 y.o. Female) Treating RN: [name] Primary Care Daison Braxton: [name] Other Clinician: Referring Akirra Lacerda: [name] Treating Jatoria Kneeland/Extender: STONE III, [name] [name]: 15 Vital Signs Time Taken: 08:43 Temperature (F): 98.2 Height (in): 61 Pulse (bpm): 66 Weight (lbs): 160 Respiratory Rate (breaths/min): 20 Body Mass Index (BMI): 30.2 Blood Pressure (mmHg): 93/33 Reference Range: 80 - 120 mg / dl Notes Made [name] aware of BP later in visit we rechecked pts BP and it was 136/61. Then [name] wanted pt to sit for 60 seconds and then stand up and take her BP again and her BP was 118/46. Electronic Signature(s) Signed: 01/27/2017 4:14:11 PM By: [name] Entered By: [name] on 01/27/2017 09:21:05

## 2017-01-30 NOTE — Progress Notes (Signed)
[name], [name] ([id number]) Visit Report for 01/27/2017 Chief Complaint Document Details [name], [name]. Date of Service: 01/27/2017 8:45 AM Medical Record Number: [id number] Patient Account Number: [id number] Date of Birth/Sex: 10/15/1955 (61 y.o. Female) Treating RN: Carolyne Fiscal, Debi Primary Care Provider: [name] Other Clinician: Referring Provider: [name] Treating Provider/Extender: Melburn Hake, Thaniel Coluccio [name]: 15 Information Obtained from: Patient Chief Complaint Patients presents for treatment of an open diabetic ulcer right lateral ankle new wound to the right knee and a blister to the left lateral LE Electronic Signature(s) Signed: 01/27/2017 6:13:19 PM By: Worthy Keeler PA-C Entered By: Worthy Keeler on 01/27/2017 09:26:51 [name] ([id number]) -------------------------------------------------------------------------------- Debridement Details [name]. Date of Service: 01/27/2017 8:45 AM Medical Record Number: [id number] Patient Account Number: [id number] Date of Birth/Sex: 08-22-1955 (61 y.o. Female) Treating RN: Carolyne Fiscal, Debi Primary Care Provider: [name] Other Clinician: Referring Provider: [name] Treating Provider/Extender: Melburn Hake, Neill Jurewicz [name]: 15 Debridement Performed for Wound #10 Right,Lateral Malleolus Assessment: Performed By: Physician STONE III, Kemond Amorin E., PA-C Debridement: Debridement Pre-procedure Verification/Time Out Yes - 09:04 Taken: Start Time: 09:05 Pain Control: Lidocaine 4% Topical Solution Level: Skin/Subcutaneous Tissue Total Area Debrided (L x 0.4 (cm) x 0.4 (cm) = 0.16 (cm) W): Tissue and other Viable, Non-Viable, Exudate, Fibrin/Slough, Subcutaneous material debrided: Instrument: Curette Bleeding: Minimum Hemostasis Achieved: Pressure End Time: 09:07 Procedural Pain: 0 Post Procedural Pain: 0 Response to Treatment: Procedure was tolerated  well Post Debridement Measurements of Total Wound Length: (cm) 0.4 Width: (cm) 0.4 Depth: (cm) 0.3 Volume: (cm) 0.038 Character of Wound/Ulcer Post Requires Further Debridement Debridement: Post Procedure Diagnosis Same as Pre-procedure Electronic Signature(s) Signed: 01/27/2017 4:14:11 PM By: Alric Quan Signed: 01/27/2017 6:13:19 PM By: Worthy Keeler PA-C Entered By: Alric Quan on 01/27/2017 09:08:04 [name] ([id number]) -------------------------------------------------------------------------------- HPI Details [name]. Date of Service: 01/27/2017 8:45 AM Medical Record Number: [id number] Patient Account Number: [id number] Date of Birth/Sex: [birth year] (61 y.o. Female) Treating RN: Carolyne Fiscal, Debi Primary Care Provider: [name] Other Clinician: Referring Provider: [name] Treating Provider/Extender: STONE III, Emelyn Roen [name]: 15 History of Present Illness Location: right lateral ankle Quality: Patient reports No Pain. Severity: Patient states wound (s) are getting better. Duration: Patient has had the wound for > 1 months prior to seeking treatment at the wound center Context: The wound appeared gradually over time, and she thinks she might have hit it with a walker Modifying Factors: Patient is currently on renal dialysis and receives treatments 3 times weekly HPI Description: 61 year old patient known to our practice from several previous visits for various problems now comes back with an ulcerated area on her right lateral malleolus which she's had for about a month and she thinks she may have hit it with her walker. No other problems and no x-ray of this has been taken. She has a past medical history of atrial fibrillation, carotid artery disease, CHF, chronic kidney disease on hemodialysis with a functioning AV fistula seen by Dr. Corene Cornea dew, diabetes mellitus with peripheral neuropathy, pancreatitis and is  status post abdominal hysterectomy, AV fistula placement in June 2016, back surgery, cholecystectomy, coronary angioplasty,several surgeries for AV fistula, and dialysis catheters. She has never been a smoker. 10/17/2016 -- had a right ankle x-ray which showed no acute pathology of the right ankle. Arterial vascular calcifications are noted and there is generalized osteopenia and demineralization 11/11/16 Patient appears to be doing fairly well with regard  to her right ankle wound. She has been tolerating the dressing changes with the Santyl that does seem to have loosened up the slough a bit. She has also been tolerating the debridement's when they have been performed. She tells me that she does tend to lay on her right side and this seems to be aggravating the issue. She does not have a Prevalon boot for use at this facility. Fortunately she is not having significant pain. 01/06/17 on evaluation today patient's right lateral ankle wound appears to be doing fairly well. There was some Slough covering although there is no evidence of infection and she does not seem to have significant pain. She is pleased with how this is progressing compared to where it was in the beginning but wishes it would completely close. 01/20/17 on evaluation today patient's wound appears to be doing better. She did have some Slough covering the wound but overall there did not appear to be a significant amount of erythema and definitely no read streaking from the wound itself. She also did not have significant pain. 01/27/17 on evaluation today patient appears to be doing okay although she unfortunately had a recent fall two days ago and during this fall she injured her right knee as well as her left lateral leg where there is a blister there. There is an abrasion over the knee. Unfortunately the knee has been giving her some discomfort and pain. Her right lateral ankle wound does appear to be doing a little bit better in my  opinion in regard to the wound bed where I no longer visualize faster today but rather she does have pink granulation [name], [name] ([id number]) tissue. There is no evidence of infection. ===== Old notes: 61 year old patient who is known to be a diabetic and has end-stage renal disease has had several comorbidities including coronary artery disease, hypertension, hyperlipidemia, pancreatitis, anemia, previous history of hysterectomy, cholecystectomy, left-sided salivary gland excision, bilateral cataract surgery,Peritoneal dialysis catheter, hemodialysis catheter. the area on the back has also been caused by instant pressure she used to sleep on a recliner all day and has significant kyphoscoliosis. As far as the wound on her right lower extremity she's not sure how this blister occurred but she thought it has been there for about 2 weeks. No recent blood investigations available and no recent hemoglobin A1c. 12/30/2014 -- she is an assisted living facility but I believe the nurses that have not followed instructions as she had some cream applied on her back and there was a different dressing. Last week she's had a AV fistula placed on her left forearm. 01/13/2015 -- she has had some localized infection at the port site and she's been on doxycycline for this. 02/05/2015 - he has developed a small blister on her right lower extremity. 02/10/2015 -- she has developed another small blister on her right anterior chest wall in the area where she's had tape for her dialysis access. this may just be injury caused by a tape burn. She had a dermatology opinion and they have taken a biopsy of her skin. She also had a left brachial AV fistula placed this week. ========= Electronic Signature(s) Signed: 01/27/2017 6:13:19 PM By: Worthy Keeler PA-C Entered By: Worthy Keeler on 01/27/2017 09:28:45 Sorber, Christean [name]  ([id number]) -------------------------------------------------------------------------------- Physical Exam Details [name]. Date of Service: 01/27/2017 8:45 AM Medical Record Number: [id number] Patient Account Number: [id number] Date of Birth/Sex: [birth year] (61 y.o. Female) Treating RN: Ahmed Prima Primary Care Provider:  Brynda Greathouse ERNEST Other Clinician: Referring Provider: [name] Treating Provider/Extender: STONE III, Jakorian Marengo [name]: 15 Constitutional . Obese and well-hydrated in no acute distress. Respiratory normal breathing without difficulty. clear to auscultation bilaterally. Cardiovascular regular rate and rhythm with normal S1, S2. Psychiatric this patient is able to make decisions and demonstrates good insight into disease process. Alert and Oriented x 3. pleasant and cooperative. Notes During evaluation I did actually just slightly puncture with a 25gauge needle the blister over the left lateral lower extremity to drain the fluid from the blistered region which she tolerated well with no pain. We then placed a dry dressing over this and I believe this will likely do very well. I did however have to perform sharp debridement in regard to the right lateral malleolus which he also tolerated well without complication in this appears to be doing better with a good wound bed once the slough was removed. In regard to the right knee no debridement was necessary and we will initiate dressing orders for this region. Fortunately this appears to be superficial. Upon arrival in the clinic patient's blood pressure measured low at 93/33 and she was feeling somewhat lightheaded. After sitting in the chair for her visit she started to feel much better and when we retook her blood pressure was 136/61 and she felt much better. Finally when we had her stand from that position her blood pressure dropped to 118/46 going from just sitting to standing she did  not have any sensation of lightheadedness but nonetheless this was a significant drop we did not have a line down measure I think she does need to have primary care evaluate her when she returns to the facility. Electronic Signature(s) Signed: 01/27/2017 6:13:19 PM By: Worthy Keeler PA-C Entered By: Worthy Keeler on 01/27/2017 09:38:12 Bleiler, Christean [name] ([id number]) -------------------------------------------------------------------------------- Physician Orders Details [name]. Date of Service: 01/27/2017 8:45 AM Medical Record Number: [id number] Patient Account Number: [id number] Date of Birth/Sex: 27-Jul-1955 (61 y.o. Female) Treating RN: Carolyne Fiscal, Debi Primary Care Provider: [name] Other Clinician: Referring Provider: [name] Treating Provider/Extender: Melburn Hake, Akasia Ahmad [name]: 15 Verbal / Phone Orders: Yes Clinician: Pinkerton, Debi Read Back and Verified: Yes Diagnosis Coding Wound Cleansing Wound #10 Right,Lateral Malleolus o Clean wound with Normal Saline. o Cleanse wound with mild soap and water o May Shower, gently pat wound dry prior to applying new dressing. Wound #11 Right Knee o Clean wound with Normal Saline. o Cleanse wound with mild soap and water o May Shower, gently pat wound dry prior to applying new dressing. Wound #12 Left,Lateral Lower Leg o Clean wound with Normal Saline. o Cleanse wound with mild soap and water o May Shower, gently pat wound dry prior to applying new dressing. Anesthetic Wound #10 Right,Lateral Malleolus o Topical Lidocaine 4% cream applied to wound bed prior to debridement - for clinic use Wound #11 Right Knee o Topical Lidocaine 4% cream applied to wound bed prior to debridement - for clinic use Wound #12 Left,Lateral Lower Leg o Topical Lidocaine 4% cream applied to wound bed prior to debridement - for clinic use Skin Barriers/Peri-Wound Care Wound #10  Right,Lateral Malleolus o Skin Prep Wound #11 Right Knee o Skin Prep Wound #12 Left,Lateral Lower Leg o Skin Prep Primary Wound Dressing Ontko, Valicia S. ([id number]) Wound #10 Right,Lateral Malleolus o Prisma Ag - moisten with saline Wound #11 Right Knee o Xeroform Secondary Dressing Wound #10 Right,Lateral Malleolus o Dry Gauze o Boardered  Foam Dressing Wound #11 Right Knee o Dry Gauze o Boardered Foam Dressing Wound #12 Left,Lateral Lower Leg o Dry Gauze o Boardered Foam Dressing Dressing Change Frequency Wound #10 Right,Lateral Malleolus o Change dressing every day. Wound #11 Right Knee o Change dressing every day. Wound #12 Left,Lateral Lower Leg o Change dressing every day. Follow-up Appointments Wound #10 Right,Lateral Malleolus o Return Appointment in 1 week. Wound #11 Right Knee o Return Appointment in 1 week. Wound #12 Left,Lateral Lower Leg o Return Appointment in 1 week. Edema Control Wound #10 Right,Lateral Malleolus o Patient to wear own compression stockings o Elevate legs to the level of the heart and pump ankles as often as possible Wound #11 Right Knee o Patient to wear own compression stockings o Elevate legs to the level of the heart and pump ankles as often as possible Fohl, Imelda S. ([id number]) Wound #12 Left,Lateral Lower Leg o Patient to wear own compression stockings o Elevate legs to the level of the heart and pump ankles as often as possible Notes We will initiate the above dressing orders for the two new regions and otherwise will continue with the collagen for the right lateral malleolus. If anything worsens in the meantime patient will contact our office for additional recommendations otherwise we will see her for follow-up regarding these wounds. Electronic Signature(s) Signed: 01/27/2017 6:13:19 PM By: Worthy Keeler PA-C Entered By: Worthy Keeler on 01/27/2017 09:42:48 Cramer, Christean [name]  ([id number]) -------------------------------------------------------------------------------- Problem List Details Patient Name: NAYSHA, SHOLL. Date of Service: 01/27/2017 8:45 AM Medical Record Number: [id number] Patient Account Number: [id number] Date of Birth/Sex: 06/10/1956 (61 y.o. Female) Treating RN: Carolyne Fiscal, Debi Primary Care Provider: [name] Other Clinician: Referring Provider: [name] Treating Provider/Extender: Melburn Hake, Bud Kaeser [name]: 15 Active Problems ICD-10 Encounter Code Description Active Date Diagnosis E11.621 Type 2 diabetes mellitus with foot ulcer 10/10/2016 Yes L97.312 Non-pressure chronic ulcer of right ankle with fat layer 10/10/2016 Yes exposed S80.211A Abrasion, right knee, initial encounter 01/27/2017 Yes N18.6 End stage renal disease 10/10/2016 Yes Z99.2 Dependence on renal dialysis 10/10/2016 Yes Inactive Problems Resolved Problems Electronic Signature(s) Signed: 01/27/2017 6:13:19 PM By: Worthy Keeler PA-C Entered By: Worthy Keeler on 01/27/2017 09:27:38 Breaker, Christean [name] ([id number]) -------------------------------------------------------------------------------- Progress Note Details [name]. Date of Service: 01/27/2017 8:45 AM Medical Record Number: [id number] Patient Account Number: [id number] Date of Birth/Sex: 1956-05-19 (61 y.o. Female) Treating RN: Carolyne Fiscal, Debi Primary Care Provider: [name] Other Clinician: Referring Provider: [name] Treating Provider/Extender: Melburn Hake, Josua Ferrebee [name]: 15 Subjective Chief Complaint Information obtained from Patient Patients presents for treatment of an open diabetic ulcer right lateral ankle new wound to the right knee and a blister to the left lateral LE History of Present Illness (HPI) The following HPI elements were documented for the patient's wound: Location: right lateral ankle Quality: Patient reports No Pain. Severity:  Patient states wound (s) are getting better. Duration: Patient has had the wound for > 1 months prior to seeking treatment at the wound center Context: The wound appeared gradually over time, and she thinks she might have hit it with a walker Modifying Factors: Patient is currently on renal dialysis and receives treatments 3 times weekly 61 year old patient known to our practice from several previous visits for various problems now comes back with an ulcerated area on her right lateral malleolus which she's had for about a month and she thinks she may have hit it with her walker. No other  problems and no x-ray of this has been taken. She has a past medical history of atrial fibrillation, carotid artery disease, CHF, chronic kidney disease on hemodialysis with a functioning AV fistula seen by Dr. Corene Cornea dew, diabetes mellitus with peripheral neuropathy, pancreatitis and is status post abdominal hysterectomy, AV fistula placement in June 2016, back surgery, cholecystectomy, coronary angioplasty,several surgeries for AV fistula, and dialysis catheters. She has never been a smoker. 10/17/2016 -- had a right ankle x-ray which showed no acute pathology of the right ankle. Arterial vascular calcifications are noted and there is generalized osteopenia and demineralization 11/11/16 Patient appears to be doing fairly well with regard to her right ankle wound. She has been tolerating the dressing changes with the Santyl that does seem to have loosened up the slough a bit. She has also been tolerating the debridement's when they have been performed. She tells me that she does tend to lay on her right side and this seems to be aggravating the issue. She does not have a Prevalon boot for use at this facility. Fortunately she is not having significant pain. 01/06/17 on evaluation today patient's right lateral ankle wound appears to be doing fairly well. There was some Slough covering although there is no evidence  of infection and she does not seem to have significant pain. She is pleased with how this is progressing compared to where it was in the beginning but wishes it would completely close. 01/20/17 on evaluation today patient's wound appears to be doing better. She did have [address] [place], [place]. ([id number]) the wound but overall there did not appear to be a significant amount of erythema and definitely no read streaking from the wound itself. She also did not have significant pain. 01/27/17 on evaluation today patient appears to be doing okay although she unfortunately had a recent fall two days ago and during this fall she injured her right knee as well as her left lateral leg where there is a blister there. There is an abrasion over the knee. Unfortunately the knee has been giving her some discomfort and pain. Her right lateral ankle wound does appear to be doing a little bit better in my opinion in regard to the wound bed where I no longer visualize faster today but rather she does have pink granulation tissue. There is no evidence of infection. ===== Old notes: 61 year old patient who is known to be a diabetic and has end-stage renal disease has had several comorbidities including coronary artery disease, hypertension, hyperlipidemia, pancreatitis, anemia, previous history of hysterectomy, cholecystectomy, left-sided salivary gland excision, bilateral cataract surgery,Peritoneal dialysis catheter, hemodialysis catheter. the area on the back has also been caused by instant pressure she used to sleep on a recliner all day and has significant kyphoscoliosis. As far as the wound on her right lower extremity she's not sure how this blister occurred but she thought it has been there for about 2 weeks. No recent blood investigations available and no recent hemoglobin A1c. 12/30/2014 -- she is an assisted living facility but I believe the nurses that have not followed instructions as she  had some cream applied on her back and there was a different dressing. Last week she's had a AV fistula placed on her left forearm. 01/13/2015 -- she has had some localized infection at the port site and she's been on doxycycline for this. 02/05/2015 - he has developed a small blister on her right lower extremity. 02/10/2015 -- she has developed another small blister on her  right anterior chest wall in the area where she's had tape for her dialysis access. this may just be injury caused by a tape burn. She had a dermatology opinion and they have taken a biopsy of her skin. She also had a left brachial AV fistula placed this week. ========= Objective Constitutional Obese and well-hydrated in no acute distress. Vitals Time Taken: 8:43 AM, Height: 61 in, Weight: 160 lbs, BMI: 30.2, Temperature: 98.2 F, Pulse: 66 bpm, Respiratory Rate: 20 breaths/min, Blood Pressure: 93/33 mmHg. General Notes: Veneda Melter, PA aware of BP later in visit we rechecked pts BP and it was 136/61. Then Roland Rack, [place] ([id number]) wanted pt to sit for 60 seconds and then stand up and take her BP again and her BP was 118/46. Respiratory normal breathing without difficulty. clear to auscultation bilaterally. Cardiovascular regular rate and rhythm with normal S1, S2. Psychiatric this patient is able to make decisions and demonstrates good insight into disease process. Alert and Oriented x 3. pleasant and cooperative. General Notes: During evaluation I did actually just slightly puncture with a 25gauge needle the blister over the left lateral lower extremity to drain the fluid from the blistered region which she tolerated well with no pain. We then placed a dry dressing over this and I believe this will likely do very well. I did however have to perform sharp debridement in regard to the right lateral malleolus which he also tolerated well without complication in this appears to be doing better with a good wound bed  once the slough was removed. In regard to the right knee no debridement was necessary and we will initiate dressing orders for this region. Fortunately this appears to be superficial. Upon arrival in the clinic patient's blood pressure measured low at 93/33 and she was feeling somewhat lightheaded. After sitting in the chair for her visit she started to feel much better and when we retook her blood pressure was 136/61 and she felt much better. Finally when we had her stand from that position her blood pressure dropped to 118/46 going from just sitting to standing she did not have any sensation of lightheadedness but nonetheless this was a significant drop we did not have a line down measure I think she does need to have primary care evaluate her when she returns to the facility. Integumentary (Hair, Skin) Wound #10 status is Open. Original cause of wound was Trauma. The wound is located on the Right,Lateral Malleolus. The wound measures 0.4cm length x 0.4cm width x 0.3cm depth; 0.126cm^2 area and 0.038cm^3 volume. There is Fat Layer (Subcutaneous Tissue) Exposed exposed. There is no tunneling or undermining noted. There is a large amount of serous drainage noted. The wound margin is flat and intact. There is medium (34-66%) pink granulation within the wound bed. There is a medium (34-66%) amount of necrotic tissue within the wound bed including Adherent Slough. The periwound skin appearance exhibited: Erythema. The periwound skin appearance did not exhibit: Callus, Crepitus, Excoriation, Induration, Rash, Scarring, Dry/Scaly, Maceration, Atrophie Blanche, Cyanosis, Ecchymosis, Hemosiderin Staining, Mottled, Pallor, Rubor. The surrounding wound skin color is noted with erythema which is circumferential. Periwound temperature was noted as No Abnormality. The periwound has tenderness on palpation. Wound #11 status is Open. Original cause of wound was Trauma. The wound is located on the Right  Knee. The wound measures 5cm length x 2.8cm width x 0.1cm depth; 10.996cm^2 area and 1.1cm^3 volume. There is no tunneling or undermining noted. There is a large amount of serosanguineous  drainage noted. The wound margin is flat and intact. There is large (67-100%) red granulation within the wound bed. There is no necrotic tissue within the wound bed. Periwound temperature was noted as No Abnormality. Wound #12 status is Open. Original cause of wound was Blister. The wound is located on the Left,Lateral Lower Leg. The wound measures 1cm length x 2.8cm width x 0.1cm depth; 2.199cm^2 area and 0.22cm^3 volume. There is no tunneling or undermining noted. There is a medium amount of serosanguineous drainage noted. The wound margin is distinct with the outline attached to the wound base. There is large Gambone, Trudee S. ([id number]) (67-100%) red granulation within the wound bed. There is no necrotic tissue within the wound bed. Periwound temperature was noted as No Abnormality. The periwound has tenderness on palpation. 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 There was a Skin/Subcutaneous Tissue Debridement ([id number]) debridement with total area of 0.16 sq cm performed by STONE III, Aiyanah Kalama E., PA-C. with the following instrument(s): Curette to remove Viable and Non-Viable tissue/material including Exudate, Fibrin/Slough, and Subcutaneous after achieving pain control using Lidocaine 4% Topical Solution. A time out was conducted at 09:04, prior to the start of the procedure. A Minimum amount of bleeding was controlled with Pressure. The procedure  was tolerated well with a pain level of 0 throughout and a pain level of 0 following the procedure. Post Debridement Measurements: 0.4cm length x 0.4cm width x 0.3cm depth; 0.038cm^3 volume. Character of Wound/Ulcer Post Debridement requires further debridement. Post procedure Diagnosis Wound #10: Same as Pre-Procedure Plan Wound Cleansing: Wound #10 Right,Lateral Malleolus: Clean wound with Normal Saline. Cleanse wound with mild soap and water May Shower, gently pat wound dry prior to applying new dressing. Wound #11 Right Knee: CHARLET, HARR. ([id number]) Clean wound with Normal Saline. Cleanse wound with mild soap and water May Shower, gently pat wound dry prior to applying new dressing. Wound #12 Left,Lateral Lower Leg: Clean wound with Normal Saline. Cleanse wound with mild soap and water May Shower, gently pat wound dry prior to applying new dressing. Anesthetic: Wound #10 Right,Lateral Malleolus: Topical Lidocaine 4% cream applied to wound bed prior to debridement - for clinic use Wound #11 Right Knee: Topical Lidocaine 4% cream applied to wound bed prior to debridement - for clinic use Wound #12 Left,Lateral Lower Leg: Topical Lidocaine 4% cream applied to wound bed prior to debridement - for clinic use Skin Barriers/Peri-Wound Care: Wound #10 Right,Lateral Malleolus: Skin Prep Wound #11 Right Knee: Skin Prep Wound #12 Left,Lateral Lower Leg: Skin Prep Primary Wound Dressing: Wound #10 Right,Lateral Malleolus: Prisma Ag - moisten with saline Wound #11 Right Knee: Xeroform Secondary Dressing: Wound #10 Right,Lateral Malleolus: Dry Gauze Boardered Foam Dressing Wound #11 Right Knee: Dry Gauze Boardered Foam Dressing Wound #12 Left,Lateral Lower Leg: Dry Gauze Boardered Foam Dressing Dressing Change Frequency: Wound #10 Right,Lateral Malleolus: Change dressing every day. Wound #11 Right Knee: Change dressing every day. Wound #12 Left,Lateral Lower  Leg: Change dressing every day. Follow-up Appointments: Wound #10 Right,Lateral Malleolus: Return Appointment in 1 week. Wound #11 Right Knee: Return Appointment in 1 week. Wound #12 Left,Lateral Lower Leg: Return Appointment in 1 week. Edema Control: Melrose, Chelli  S. ([id number]) Wound #10 Right,Lateral Malleolus: Patient to wear own compression stockings Elevate legs to the level of the heart and pump ankles as often as possible Wound #11 Right Knee: Patient to wear own compression stockings Elevate legs to the level of the heart and pump ankles as often as possible Wound #12 Left,Lateral Lower Leg: Patient to wear own compression stockings Elevate legs to the level of the heart and pump ankles as often as possible General Notes: We will initiate the above dressing orders for the two new regions and otherwise will continue with the collagen for the right lateral malleolus. If anything worsens in the meantime patient will contact our office for additional recommendations otherwise we will see her for follow-up regarding these wounds. Electronic Signature(s) Signed: 01/27/2017 6:13:19 PM By: Worthy Keeler PA-C Entered By: Worthy Keeler on 01/27/2017 09:43:12 Waldrep, Christean [name] ([id number]) -------------------------------------------------------------------------------- SuperBill Details [name]. Date of Service: 01/27/2017 Medical Record Number: [id number] Patient Account Number: [id number] Date of Birth/Sex: 02/05/1956 (61 y.o. 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 S80.211A Abrasion, right knee, initial encounter N18.6 End stage renal  disease Z99.2 Dependence on renal dialysis Facility Procedures CPT4 Code Description: [id number] 11042 - DEB SUBQ TISSUE 20 SQ CM/< ICD-10 Description Diagnosis L97.312 Non-pressure chronic ulcer of right ankle with fat Modifier: layer expose Quantity: 1 d Physician Procedures CPT4 Code Description: 3094076 80881 - WC PHYS LEVEL 3 - EST PT ICD-10 Description Diagnosis E11.621 Type 2 diabetes mellitus with foot ulcer S80.211A Abrasion, right knee, initial encounter L97.312 Non-pressure chronic ulcer of right ankle with fat  N18.6 End stage renal disease Modifier: layer expose Quantity: 1 d CPT4 Code Description: 1031594 11042 - WC PHYS SUBQ TISS 20 SQ CM ICD-10 Description Diagnosis L97.312 Non-pressure chronic ulcer of right ankle with fat Modifier: layer expose Quantity: 1 d Electronic Signature(s) Signed: 01/27/2017 6:13:19 PM By: Irean Hong Crosley, Elisse SMarland Kitchen ([id number]) Entered By: Worthy Keeler on 01/27/2017 09:43:40

## 2017-02-03 ENCOUNTER — Encounter: Payer: Self-pay | Admitting: Urology

## 2017-02-03 ENCOUNTER — Ambulatory Visit (INDEPENDENT_AMBULATORY_CARE_PROVIDER_SITE_OTHER): Payer: Medicare Other | Admitting: Urology

## 2017-02-03 VITALS — BP 169/78 | HR 82 | Ht 61.0 in | Wt 165.0 lb

## 2017-02-03 DIAGNOSIS — N186 End stage renal disease: Secondary | ICD-10-CM | POA: Diagnosis not present

## 2017-02-03 DIAGNOSIS — Z992 Dependence on renal dialysis: Secondary | ICD-10-CM | POA: Diagnosis not present

## 2017-02-03 DIAGNOSIS — N2889 Other specified disorders of kidney and ureter: Secondary | ICD-10-CM

## 2017-02-03 NOTE — Progress Notes (Signed)
02/03/2017 12:02 PM   [name] 27-Oct-1955 [id number]  Referring provider: Marden Noble, MD [address] [place], [place] [address]  Chief Complaint  Patient presents with  . Follow-up    discussion of renal mass    HPI: 61 year old female with history of end-stage renal disease on hemodialysis since 2011. Presents today for follow-up of a known left renal mass.  She was previously followed by Dr. 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  More recently, she underwent CT with oral but without IV contrast ordered by her PCP, Dr. Chancy Milroy which showed a mass arising from the lower pole of the kidney measuring 2.2 x 3.2 x 3.5 cm. The lesion was not unable to be fully characterized due to lack of contrast.  It did appear moderately hypoechoic consistent with previous ablation.  She underwent follow-up CT abdomen with and without contrast for further characterization of this mass on 11/30/2016. This showed an enlarging, heterogeneous enhancing lower pole mass measuring 3.4 cm, previously 2.4 cm (3/16) in the left lower pole.  Denies any flank pain or gross hematuria. She does void spontaneously every day without difficulty.  She has multiple medical comorbidities as below, COPD on 02.    Previous abd surgeries includes lap cholecystectomy, open abd hysterectomy, and PD diaylsis catheter placement.    Today, she returns to discuss treatment options. She underwent extensive preoperative workup was planned cryoablation of the lesion, however, due to her comorbidities primarily pulmonary, she is not cleared by her pulmonologist for anesthesia.  PMH: Past Medical History:  Diagnosis Date  . Atrial fibrillation ([place])   . CAD (coronary artery disease)   . CHF (congestive heart failure) ([place])   . CKD (chronic kidney disease)   . COPD (chronic obstructive pulmonary disease) ([place])   . Diabetes ([place])   . Diabetic peripheral neuropathy associated  with type 2 diabetes mellitus ([place])   . Dialysis patient ([place])   . GERD (gastroesophageal reflux disease)   . HTN (hypertension)   . MI (myocardial infarction) ([place]) 05-17-2014  . Pancreatitis   . Peripheral autonomic neuropathy due to diabetes mellitus ([place])   . Renal insufficiency    Pt is on dialysis Tuesday, Thursday and Saturday.    Surgical History: Past Surgical History:  Procedure Laterality Date  . ABDOMINAL HYSTERECTOMY    . AV FISTULA PLACEMENT Left 12/25/2014   Procedure: ARTERIOVENOUS (AV) FISTULA CREATION;  Surgeon: Algernon Huxley, MD;  Location: [facility] ORS;  Service: Vascular;  Laterality: Left;  . BACK SURGERY    . CHOLECYSTECTOMY    . CORONARY ANGIOPLASTY     Cardiac stents  . EYE SURGERY     bilateral cataract extractions  . IR RADIOLOGIST EVAL & MGMT  12/14/2016  . LEFT HEART CATHETERIZATION WITH CORONARY ANGIOGRAM N/A 05/20/2014   Procedure: LEFT HEART CATHETERIZATION WITH CORONARY ANGIOGRAM;  Surgeon: Clent Demark, MD;  Location: [facility] CATH LAB;  Service: Cardiovascular;  Laterality: N/A;  . PERIPHERAL VASCULAR CATHETERIZATION N/A 02/12/2015   Procedure: A/V Shuntogram/Fistulagram;  Surgeon: Algernon Huxley, MD;  Location: [facility] CV LAB;  Service: Cardiovascular;  Laterality: N/A;  . PERIPHERAL VASCULAR CATHETERIZATION N/A 02/12/2015   Procedure: A/V Shunt Intervention;  Surgeon: Algernon Huxley, MD;  Location: [facility] CV LAB;  Service: Cardiovascular;  Laterality: N/A;  . PERIPHERAL VASCULAR CATHETERIZATION N/A 04/09/2015   Procedure: Dialysis/Perma Catheter Removal;  Surgeon: Algernon Huxley, MD;  Location: [facility] CV LAB;  Service:  Cardiovascular;  Laterality: N/A;  . PORTACATH PLACEMENT      Home Medications:  Allergies as of 02/03/2017      Reactions   Adhesive [tape] Other (See Comments)   Reaction:  Blisters    Shellfish-derived Products    Other reaction(s): Unknown   Doxycycline Rash      Medication List       Accurate as of 02/03/17 12:02 PM.  Always use your most recent med list.          albuterol (2.5 MG/3ML) 0.083% NEBU 3 mL, albuterol (5 MG/ML) 0.5% NEBU 0.5 mL Inhale into the lungs.   amLODipine 10 MG tablet Commonly known as:  NORVASC amlodipine 10 mg tablet   aspirin EC 81 MG tablet Take 81 mg by mouth daily.   atorvastatin 20 MG tablet Commonly known as:  LIPITOR atorvastatin 20 mg tablet   CALCIUM 600+D 600-200 MG-UNIT Tabs Generic drug:  Calcium Carbonate-Vitamin D Calcium 600   clopidogrel 75 MG tablet Commonly known as:  PLAVIX clopidogrel 75 mg tablet   COLACE 100 MG capsule Generic drug:  docusate sodium docusate sodium 100 mg capsule  Take 1 capsule every day by oral route.   furosemide 80 MG tablet Commonly known as:  LASIX furosemide 80 mg tablet   HUMALOG 100 UNIT/ML injection Generic drug:  insulin lispro Humalog U-100 Insulin 100 unit/mL subcutaneous solution   hydrALAZINE 50 MG tablet Commonly known as:  APRESOLINE Take 50 mg by mouth 2 (two) times daily.   isosorbide dinitrate 30 MG tablet Commonly known as:  ISORDIL Take 30 mg by mouth daily.   labetalol 300 MG tablet Commonly known as:  NORMODYNE Take 300 mg by mouth 2 (two) times daily.   loratadine 10 MG tablet Commonly known as:  CLARITIN Take 10 mg by mouth daily.   losartan 50 MG tablet Commonly known as:  COZAAR Take 50 mg by mouth 2 (two) times daily.   LYRICA 75 MG capsule Generic drug:  pregabalin Lyrica 75 mg capsule   magnesium oxide 400 MG tablet Commonly known as:  MAG-OX Take 400 mg by mouth daily.   metoCLOPramide 5 MG tablet Commonly known as:  REGLAN metoclopramide 5 mg tablet   pantoprazole 40 MG tablet Commonly known as:  PROTONIX pantoprazole 40 mg tablet,delayed release   predniSONE 10 MG tablet Commonly known as:  DELTASONE Take 10 mg by mouth daily with breakfast.   promethazine 25 MG tablet Commonly known as:  PHENERGAN Take 25 mg by mouth every 6 (six) hours as needed for  nausea or vomiting.   rOPINIRole 0.25 MG tablet Commonly known as:  REQUIP ropinirole 0.25 mg tablet   tiotropium 18 MCG inhalation capsule Commonly known as:  SPIRIVA Place 18 mcg into inhaler and inhale daily.   tiZANidine 4 MG tablet Commonly known as:  ZANAFLEX tizanidine 4 mg tablet   TOUJEO SOLOSTAR 300 UNIT/ML Sopn Generic drug:  Insulin Glargine Inject into the skin.       Allergies:  Allergies  Allergen Reactions  . Adhesive [Tape] Other (See Comments)    Reaction:  Blisters   . Shellfish-Derived Products     Other reaction(s): Unknown  . Doxycycline Rash    Family History: Family History  Problem Relation Age of Onset  . Diabetes Mother   . Hypertension Mother   . Breast cancer Maternal Grandmother   . Breast cancer Paternal Aunt     Social History:  reports that she has never smoked. She has  never used smokeless tobacco. She reports that she does not drink alcohol or use drugs.  ROS: UROLOGY Frequent Urination?: No Hard to postpone urination?: No Burning/pain with urination?: No Get up at night to urinate?: No Leakage of urine?: No Urine stream starts and stops?: No Trouble starting stream?: No Do you have to strain to urinate?: No Blood in urine?: No Urinary tract infection?: No Sexually transmitted disease?: No Injury to kidneys or bladder?: No Painful intercourse?: No Weak stream?: No Currently pregnant?: No Vaginal bleeding?: No Last menstrual period?: n  Gastrointestinal Nausea?: No Vomiting?: No Indigestion/heartburn?: No Diarrhea?: No Constipation?: No  Constitutional Fever: No Night sweats?: No Weight loss?: No Fatigue?: Yes  Skin Skin rash/lesions?: No Itching?: No  Eyes Blurred vision?: No Double vision?: No  Ears/Nose/Throat Sore throat?: No Sinus problems?: No  Hematologic/Lymphatic Swollen glands?: No Easy bruising?: Yes  Cardiovascular Leg swelling?: No Chest pain?: No  Respiratory Cough?:  Yes Shortness of breath?: Yes  Endocrine Excessive thirst?: No  Musculoskeletal Back pain?: No Joint pain?: No  Neurological Headaches?: No Dizziness?: No  Psychologic Depression?: No Anxiety?: No  Physical Exam: BP (!) 169/78   Pulse 82   Ht 5\' 1"  (1.549 m)   Wt 165 lb (74.8 kg)   BMI 31.18 kg/m   Constitutional:  Alert and oriented, No acute distress.  In wheelchair.  Appears older than stated age. HEENT: [place] AT, moist mucus membranes.  Trachea midline, no masses. Cardiovascular: No clubbing, cyanosis, or edema. Respiratory: Normal respiratory effort, no increased work of breathing.  Wearing 02. GU: No CVA tenderness.  Skin: No rashes, bruises or suspicious lesions.. Neurologic: Grossly intact, no focal deficits, moving all 4 extremities. Psychiatric: Normal mood and affect.  Laboratory Data: Lab Results  Component Value Date   WBC 11.3 (H) 09/30/2015   HGB 9.6 (L) 09/30/2015   HCT 29.0 (L) 09/30/2015   MCV 89.8 09/30/2015   PLT 183 09/30/2015    Lab Results  Component Value Date   CREATININE 4.21 (H) 09/30/2015    Lab Results  Component Value Date   HGBA1C 6.2 (H) 05/20/2014    Urinalysis n/a  Pertinent Imaging: No new interval imaging  Assessment & Plan:    1. Renal mass History of left lower pole renal mass status post cryoablation 2013.  Most recent CT imaging, interval increased in size now up to 3.4 cm (11/2016) and contrast enhancement which is suspicious for enlarging renal cell carcinoma- growth rate greater than 0.3 cm annually.  Unfortunately, she's not a surgical candidate. Additionally, she will be unable to have a cryoablation of procedure due to inability to tolerate general anesthesia.  I have offered her referral to a tertiary care center such as [facility] or [facility] who may be able to do a cryoablation of procedure under sedation.  She declined this.  I recommended continued surveillance of the lesion including CT abdomen and pelvis with  contrast and chest x-ray in 6 months. If the lesion becomes metastatic, we'll consider treatment chemotherapeutic agent for palliation.    She understands this.  2. ESRD on dialysis [facility]) As above  Return in about 6 months (around 08/06/2017) for CT abd/ pelvis / CXR.  Hollice Espy, MD  [facility] [address] [place], [place], [place] [address] [phone]

## 2017-02-10 ENCOUNTER — Ambulatory Visit: Payer: Medicare Other | Admitting: Surgery

## 2017-02-13 ENCOUNTER — Encounter: Payer: Medicare Other | Admitting: Surgery

## 2017-02-13 DIAGNOSIS — E11621 Type 2 diabetes mellitus with foot ulcer: Secondary | ICD-10-CM | POA: Diagnosis not present

## 2017-02-14 NOTE — Progress Notes (Signed)
[name], [name] ([id number]) Visit Report for 02/13/2017 Arrival Information Details Patient Name: [name], [name]. Date of Service: 02/13/2017 12:30 PM Medical Record Number: [id number] Patient Account Number: [id number] Date of Birth/Sex: [birth year] (61 y.o. Female) Treating RN: [name], [name] Primary Care [name]: [name] Other Clinician: Referring [name]: [name] Treating [name]/Extender: [name] in Treatment: 18 Visit Information History Since Last Visit All ordered tests and consults were completed: No Patient Arrived: Wheel Chair Added or deleted any medications: No Arrival Time: 12:36 Any new allergies or adverse reactions: No Accompanied By: self Had a fall or experienced change in No Transfer Assistance: EasyPivot Patient activities of daily living that may affect Lift risk of falls: Patient Identification Verified: Yes Signs or symptoms of abuse/neglect since last No Secondary Verification Process Yes visito Completed: Hospitalized since last visit: No Patient Requires Transmission- No Has Dressing in Place as Prescribed: Yes Based Precautions: Pain Present Now: No Patient Has Alerts: Yes Patient Alerts: Patient on Blood Thinner Electronic Signature(s) Signed: 02/13/2017 5:05:30 PM By: [name] Entered By: [name] on 02/13/2017 12:39:40 Visconti, [name] ([id number]) -------------------------------------------------------------------------------- Encounter Discharge Information Details Patient Name: [name], [name] Date of Service: 02/13/2017 12:30 PM Medical Record Number: [id number] Patient Account Number: [id number] Date of Birth/Sex: 1955-12-14 (61 y.o. Female) Treating RN: [name], [name] Primary Care Uriyah Massimo: [name] Other Clinician: Referring Vidyuth Belsito: [name] Treating Jama Krichbaum/Extender: [name] in Treatment: 18 Encounter Discharge Information Items Discharge Pain Level: 0 Discharge  Condition: Stable Ambulatory Status: Wheelchair Discharge Destination: Nursing Home Transportation: Other Accompanied By: self Schedule Follow-up Appointment: Yes Medication Reconciliation completed and provided to Patient/Care No Kendall Justo: Provided on Clinical Summary of Care: 02/13/2017 Form Type Recipient Paper Patient LD Electronic Signature(s) Signed: 02/13/2017 5:05:30 PM By: [name] Previous Signature: 02/13/2017 1:13:07 PM Version By: [name] Entered By: [name] on 02/13/2017 13:25:59 Weir, [name] ([id number]) -------------------------------------------------------------------------------- Lower Extremity Assessment Details Patient Name: Bascomb, [name] Date of Service: 02/13/2017 12:30 PM Medical Record Number: [id number] Patient Account Number: [id number] Date of Birth/Sex: 15-May-1956 (61 y.o. Female) Treating RN: [name], [name] Primary Care Elya Diloreto: [name] Other Clinician: Referring Valentina Alcoser: [name] Treating Demari Gales/Extender: [name] in Treatment: 18 Vascular Assessment Pulses: Dorsalis Pedis Palpable: [Left:Yes] [Right:Yes] Posterior Tibial Extremity colors, hair growth, and conditions: Extremity Color: [Left:Normal] [Right:Normal] Temperature of Extremity: [Left:Warm] [Right:Warm] Capillary Refill: [Left:< 3 seconds] [Right:< 3 seconds] Toe Nail Assessment Left: Right: Thick: No No Discolored: No No Deformed: No No Improper Length and Hygiene: No No Electronic Signature(s) Signed: 02/13/2017 5:05:30 PM By: [name] Entered By: [name] on 02/13/2017 13:24:25 [name], [name] ([id number]) -------------------------------------------------------------------------------- Multi Wound Chart Details Patient Name: [name], [name] Date of Service: 02/13/2017 12:30 PM Medical Record Number: [id number] Patient Account Number: [id number] Date of Birth/Sex: [birth year] (61 y.o. Female) Treating RN:  [name], [name] Primary Care Zahraa Bhargava: [name] Other Clinician: Referring Alyissa Whidbee: [name] Treating Nickey Canedo/Extender: [name] in Treatment: 18 Vital Signs Height(in): 61 Pulse(bpm): 89 Weight(lbs): 160 Blood Pressure 170/78 (mmHg): Body Mass Index(BMI): 30 Temperature(F): 97.7 Respiratory Rate 20 (breaths/min): Photos: [10:No Photos] [11:No Photos] [12:No Photos] Wound Location: [10:Right Malleolus - LateralRight Knee] [12:Left Lower Leg - Lateral] Wounding Event: [10:Trauma] [11:Trauma] [12:Blister] Primary Etiology: [10:Trauma, Other] [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] [12:the Lower Extremity] Secondary Etiology: [10:N/A] [11:Trauma, Other] [12:N/A] Comorbid History: [10:Glaucoma, Anemia, Coronary Artery Disease, Coronary Artery Disease, Coronary Artery Disease, Hypertension, Type II Diabetes, End Stage Renal Disease, Osteoarthritis, Neuropathy Osteoarthritis, Neuropathy Osteoarthritis, Neuropathy]  [  11:Glaucoma, Anemia, Hypertension, Type II Diabetes, End Stage Renal Disease,] [12:Glaucoma, Anemia, Hypertension, Type II Diabetes, End Stage Renal Disease,] Date Acquired: [10:09/12/2016] [11:01/22/2017] [12:01/25/2017] Weeks of Treatment: [10:18] [11:2] [12:2] Wound Status: [10:Open] [11:Open] [12:Open] Measurements L x W x D 0.3x0.3x0.2 [11:0x0x0] [12:0x0x0] (cm) Area (cm) : [10:0.071] [11:0] [12:0] Volume (cm) : [10:0.014] [11:0] [12:0] % Reduction in Area: [10:74.90%] [11:100.00%] [12:100.00%] % Reduction in Volume: 75.40% [11:100.00%] [12:100.00%] Classification: [10:Full Thickness Without Exposed Support Structures] [11:Grade 1] [12:Grade 1] HBO Classification: [10:Grade 1] [11:N/A] [12:N/A] Exudate Amount: [10:Large] [11:None Present] [12:None Present] Exudate Type: [10:Serous] [11:N/A] [12:N/A] Exudate Color: [10:amber] [11:N/A] [12:N/A] Wound Margin: [10:Flat and Intact] [11:Flat and Intact] [12:Distinct,  outline attached] Granulation Amount: [10:None Present (0%)] [11:None Present (0%)] [12:None Present (0%)] Necrotic Amount: Large (67-100%) None Present (0%) None Present (0%) Exposed Structures: Fat Layer (Subcutaneous N/A N/A Tissue) Exposed: Yes Fascia: No Tendon: No Muscle: No Joint: No Bone: No Epithelialization: None Large (67-100%) Large (67-100%) Debridement: Debridement (11941- N/A N/A 11047) Pre-procedure 12:54 N/A N/A Verification/Time Out Taken: Pain Control: Lidocaine 4% Topical N/A N/A Solution Tissue Debrided: Fibrin/Slough, Exudates, N/A N/A Subcutaneous Level: Skin/Subcutaneous N/A N/A Tissue Debridement Area (sq 0.09 N/A N/A cm): Instrument: Curette N/A N/A Bleeding: Minimum N/A N/A Hemostasis Achieved: Pressure N/A N/A Procedural Pain: 0 N/A N/A Post Procedural Pain: 0 N/A N/A Debridement Treatment Procedure was tolerated N/A N/A Response: well Post Debridement 0.3x0.3x0.2 N/A N/A Measurements L x W x D (cm) Post Debridement 0.014 N/A N/A Volume: (cm) Periwound Skin Texture: Excoriation: No No Abnormalities Noted No Abnormalities Noted Induration: No Callus: No Crepitus: No Rash: No Scarring: No Periwound Skin Maceration: No No Abnormalities Noted No Abnormalities Noted Moisture: Dry/Scaly: No Periwound Skin Color: Erythema: Yes No Abnormalities Noted No Abnormalities Noted Atrophie Blanche: No Cyanosis: No Ecchymosis: No Hemosiderin Staining: No Mottled: No Pallor: No Rubor: No [name], [name] ([id number]) Erythema Location: Circumferential N/A N/A Temperature: No Abnormality No Abnormality No Abnormality Tenderness on Yes No Yes Palpation: Wound Preparation: Ulcer Cleansing: Ulcer Cleansing: Ulcer Cleansing: Rinsed/Irrigated with Rinsed/Irrigated with Rinsed/Irrigated with Saline Saline Saline Topical Anesthetic Topical Anesthetic Topical Anesthetic Applied: Other: lidocaine Applied: None Applied: None 4% Procedures Performed:  Debridement N/A N/A Wound Number: 13 N/A N/A Photos: No Photos N/A N/A Wound Location: Right, Medial Foot N/A N/A Wounding Event: Gradually Appeared N/A N/A Primary Etiology: Diabetic Wound/Ulcer of N/A N/A the Lower Extremity Secondary Etiology: N/A N/A N/A Comorbid History: Glaucoma, Anemia, N/A N/A Coronary Artery Disease, Hypertension, Type II Diabetes, End Stage Renal Disease, Osteoarthritis, Neuropathy Date Acquired: 12/14/2016 N/A N/A Weeks of Treatment: 0 N/A N/A Wound Status: Open N/A N/A Measurements L x W x D 0.6x1.5x0.2 N/A N/A (cm) Area (cm) : 0.707 N/A N/A Volume (cm) : 0.141 N/A N/A % Reduction in Area: N/A N/A N/A % Reduction in Volume: N/A N/A N/A Classification: Grade 1 N/A N/A HBO Classification: N/A N/A N/A Exudate Amount: Large N/A N/A Exudate Type: Serous N/A N/A Exudate Color: amber N/A N/A Wound Margin: Distinct, outline attached N/A N/A Granulation Amount: None Present (0%) N/A N/A Necrotic Amount: Large (67-100%) N/A N/A Exposed Structures: N/A N/A N/A Epithelialization: None N/A N/A Debridement: Debridement ([id number]- N/A N/A 11047) 12:54 N/A N/A [name], [name] ([id number]) Pre-procedure Verification/Time Out Taken: Pain Control: Lidocaine 4% Topical N/A N/A Solution Tissue Debrided: Fibrin/Slough, Exudates, N/A N/A Subcutaneous Level: Skin/Subcutaneous N/A N/A Tissue Debridement Area (sq 0.9 N/A N/A cm): Instrument: Curette N/A N/A Bleeding: Minimum N/A N/A Hemostasis Achieved: Pressure N/A N/A Procedural Pain: 0  N/A N/A Post Procedural Pain: 0 N/A N/A Debridement Treatment Procedure was tolerated N/A N/A Response: well Post Debridement 0.6x1.5x0.2 N/A N/A Measurements L x W x D (cm) Post Debridement 0.141 N/A N/A Volume: (cm) Periwound Skin Texture: No Abnormalities Noted N/A N/A Periwound Skin Maceration: Yes N/A N/A Moisture: Periwound Skin Color: Erythema: Yes N/A N/A Erythema Location: Circumferential N/A  N/A Temperature: No Abnormality N/A N/A Tenderness on Yes N/A N/A Palpation: Wound Preparation: Ulcer Cleansing: N/A N/A Rinsed/Irrigated with Saline Topical Anesthetic Applied: Other: lidocaine 4% Procedures Performed: Debridement N/A N/A Treatment Notes Electronic Signature(s) Signed: 02/13/2017 1:07:30 PM By: [name], [name] Entered By: [name] on 02/13/2017 13:07:29 Stempel, [name] ([id number]) -------------------------------------------------------------------------------- [place] Details Patient Name: MASEY, SCHEIBER. Date of Service: 02/13/2017 12:30 PM Medical Record Number: [id number] Patient Account Number: [id number] Date of Birth/Sex: Nov 11, 1955 (61 y.o. Female) Treating RN: [name], [name] Primary Care Annaliza Zia: [name] Other Clinician: Referring Quinette Hentges: [name] Treating Randie Tallarico/Extender: [name] in Treatment: 18 Active Inactive ` Abuse / Safety / Falls / Self Care Management Nursing Diagnoses: Potential for falls Goals: Patient will remain injury free Date Initiated: 10/10/2016 Target Resolution Date: 12/23/2016 Goal Status: Active Interventions: Assess fall risk on admission and as needed Notes: ` Nutrition Nursing Diagnoses: Potential for alteratiion in Nutrition/Potential for imbalanced nutrition Goals: Patient/caregiver agrees to and verbalizes understanding of need to use nutritional supplements and/or vitamins as prescribed Date Initiated: 10/10/2016 Target Resolution Date: 10/21/2016 Goal Status: Active Interventions: Assess patient nutrition upon admission and as needed per policy Notes: ` Orientation to the Wound Care Program Nursing Diagnoses: Knowledge deficit related to the wound healing center program [name], [name] DEPASQUALE ([id number]) Goals: Patient/caregiver will verbalize understanding of the [facility] Date Initiated: 10/10/2016 Target Resolution Date:  12/23/2016 Goal Status: Active Interventions: Provide education on orientation to the wound center Notes: ` Wound/Skin Impairment Nursing Diagnoses: Impaired tissue integrity Goals: Patient/caregiver will verbalize understanding of skin care regimen Date Initiated: 10/10/2016 Target Resolution Date: 12/23/2016 Goal Status: Active Ulcer/skin breakdown will have a volume reduction of 30% by week 4 Date Initiated: 10/10/2016 Target Resolution Date: 12/23/2016 Goal Status: Active Ulcer/skin breakdown will have a volume reduction of 50% by week 8 Date Initiated: 10/10/2016 Target Resolution Date: 12/23/2016 Goal Status: Active Ulcer/skin breakdown will have a volume reduction of 80% by week 12 Date Initiated: 10/10/2016 Target Resolution Date: 12/23/2016 Goal Status: Active Ulcer/skin breakdown will heal within 14 weeks Date Initiated: 10/10/2016 Target Resolution Date: 12/23/2016 Goal Status: Active Interventions: Assess patient/caregiver ability to obtain necessary supplies Assess patient/caregiver ability to perform ulcer/skin care regimen upon admission and as needed Assess ulceration(s) every visit Notes: Electronic Signature(s) Signed: 02/13/2017 5:05:30 PM By: [name] Entered By: [name] on 02/13/2017 13:24:31 Mach, [name] ([id number]) -------------------------------------------------------------------------------- Pain Assessment Details Patient Name: [name] Date of Service: 02/13/2017 12:30 PM Medical Record Number: [id number] Patient Account Number: [id number] Date of Birth/Sex: [birth year] (61 y.o. Female) Treating RN: [name], [name] Primary Care Ladeja Pelham: [name] Other Clinician: Referring Lajuanda Penick: [name] Treating Vaden Becherer/Extender: [name] in Treatment: 18 Active Problems Location of Pain Severity and Description of Pain Patient Has Paino No Site Locations With Dressing Change: No Pain Management and Medication Current  Pain Management: Electronic Signature(s) Signed: 02/13/2017 5:05:30 PM By: [name] Entered By: [name] on 02/13/2017 12:39:47 Gunnarson, [name] ([id number]) -------------------------------------------------------------------------------- Patient/Caregiver Education Details Patient Name: [name] Date of Service: 02/13/2017 12:30 PM Medical Record Number: [id number] Patient Account Number:  [id number] Date of Birth/Gender: 1956/02/08 (61 y.o. Female) Treating RN: [name], [name] Primary Care Physician: [name] Other Clinician: Referring Physician: Nicky [name] Treating Physician/Extender: [name] in Treatment: 24 Education Assessment Education Provided To: Patient Education Topics Provided Wound/Skin Impairment: Handouts: Other: change dressing as ordered Methods: Demonstration, Explain/Verbal Responses: State content correctly Electronic Signature(s) Signed: 02/13/2017 5:05:30 PM By: [name] Entered By: [name] on 02/13/2017 13:26:12 Holmer, Maureena S. ([id number]) -------------------------------------------------------------------------------- Wound Assessment Details Patient Name: [name], [name] Date of Service: 02/13/2017 12:30 PM Medical Record Number: [id number] Patient Account Number: [id number] Date of Birth/Sex: 1956-02-02 (61 y.o. Female) Treating RN: [name], [name] Primary Care Winford Hehn: [name] Other Clinician: Referring Keashia Haskins: [name] Treating Tameeka Luo/Extender: [name] in Treatment: 18 Wound Status Wound Number: 10 Primary Trauma, Other Etiology: Wound Location: Right Malleolus - Lateral Wound Open Wounding Event: Trauma Status: Date Acquired: 09/12/2016 Comorbid Glaucoma, Anemia, Coronary Artery Weeks Of Treatment: 18 History: Disease, Hypertension, Type II Clustered Wound: No Diabetes, End Stage Renal Disease, Osteoarthritis, Neuropathy Photos Photo Uploaded By: [name] on 02/13/2017 16:48:27 Wound Measurements Length: (cm) 0.3 Width: (cm) 0.3 Depth: (cm) 0.2 Area: (cm) 0.071 Volume: (cm) 0.014 % Reduction in Area: 74.9% % Reduction in Volume: 75.4% Epithelialization: None Tunneling: No Undermining: No Wound Description Full Thickness Without Classification: Exposed Support Structures Diabetic Severity Grade 1 (Wagner): Wound Margin: Flat and Intact Exudate Amount: Large Exudate Type: Serous Exudate Color: amber Foul Odor After Cleansing: No Slough/Fibrino Yes Wound Bed Orihuela, [name] ([id number]) Granulation Amount: None Present (0%) Exposed Structure Necrotic Amount: Large (67-100%) Fascia Exposed: No Necrotic Quality: Adherent Slough Fat Layer (Subcutaneous Tissue) Exposed: Yes Tendon Exposed: No Muscle Exposed: No Joint Exposed: No Bone Exposed: No Periwound Skin Texture Texture Color No Abnormalities Noted: No No Abnormalities Noted: No Callus: No Atrophie Blanche: No Crepitus: No Cyanosis: No Excoriation: No Ecchymosis: No Induration: No Erythema: Yes Rash: No Erythema Location: Circumferential Scarring: No Hemosiderin Staining: No Mottled: No Moisture Pallor: No No Abnormalities Noted: No Rubor: No Dry / Scaly: No Maceration: No Temperature / Pain Temperature: No Abnormality Tenderness on Palpation: Yes Wound Preparation Ulcer Cleansing: Rinsed/Irrigated with Saline Topical Anesthetic Applied: Other: lidocaine 4%, Treatment Notes Wound #10 (Right, Lateral Malleolus) 1. Cleansed with: Clean wound with Normal Saline 2. Anesthetic Topical Lidocaine 4% cream to wound bed prior to debridement 3. Peri-wound Care: Skin Prep 4. Dressing Applied: Prisma Ag 5. Secondary Dressing Applied Bordered Foam Dressing Dry Gauze Electronic Signature(s) Signed: 02/13/2017 5:05:30 PM By: [name] Entered By: [name] on 02/13/2017 12:48:54 Raso, Elma S. ([id number]) Sollers, [name]  ([id number]) -------------------------------------------------------------------------------- Wound Assessment Details Patient Name: Giorgio, Franchon S. Date of Service: 02/13/2017 12:30 PM Medical Record Number: [id number] Patient Account Number: [id number] Date of Birth/Sex: 12/21/1955 (61 y.o. Female) Treating RN: [name], [name] Primary Care Roshana Shuffield: [name] Other Clinician: Referring Alicen Donalson: [name] Treating Shatonya Passon/Extender: [name] in Treatment: 18 Wound Status Wound Number: 11 Primary Diabetic Wound/Ulcer of the Lower Etiology: Extremity Wound Location: Right Knee Secondary Trauma, Other Wounding Event: Trauma Etiology: Date Acquired: 01/22/2017 Wound Open Weeks Of Treatment: 2 Status: Clustered Wound: No Comorbid Glaucoma, Anemia, Coronary Artery History: Disease, Hypertension, Type II Diabetes, End Stage Renal Disease, Osteoarthritis, Neuropathy Photos Photo Uploaded By: [name] on 02/13/2017 16:48:51 Wound Measurements Length: (cm) 0 % Reduction i Width: (cm) 0 % Reduction i Depth: (cm) 0 Epithelializa Area: (cm) 0 Tunneling: Volume: (cm) 0 Undermining: n Area: 100% n Volume: 100% tion: Large (67-100%) No No  Wound Description Classification: Grade 1 Wound Margin: Flat and Intact Exudate Amount: None Present Foul Odor After Cleansing: No Slough/Fibrino No Wound Bed Granulation Amount: None Present (0%) Necrotic Amount: None Present (0%) Mccamy, Tannie S. ([id number]) Periwound Skin Texture Texture Color No Abnormalities Noted: No No Abnormalities Noted: No Moisture Temperature / Pain No Abnormalities Noted: No Temperature: No Abnormality Wound Preparation Ulcer Cleansing: Rinsed/Irrigated with Saline Topical Anesthetic Applied: None Electronic Signature(s) Signed: 02/13/2017 5:05:30 PM By: [name] Entered By: [name] on 02/13/2017 12:55:34 Beste, Oriyah S.  ([id number]) -------------------------------------------------------------------------------- Wound Assessment Details Patient Name: Langill, Aletha S. Date of Service: 02/13/2017 12:30 PM Medical Record Number: [id number] Patient Account Number: [id number] Date of Birth/Sex: Aug 02, 1955 (61 y.o. Female) Treating RN: [name], [name] Primary Care Remmi Armenteros: [name] Other Clinician: Referring Deontra Pereyra: [name] Treating Vanna Shavers/Extender: [name] in Treatment: 18 Wound Status Wound Number: 12 Primary Diabetic Wound/Ulcer of the Lower Etiology: Extremity Wound Location: Left Lower Leg - Lateral Wound Open Wounding Event: Blister Status: Date Acquired: 01/25/2017 Comorbid Glaucoma, Anemia, Coronary Artery Weeks Of Treatment: 2 History: Disease, Hypertension, Type II Clustered Wound: No Diabetes, End Stage Renal Disease, Osteoarthritis, Neuropathy Photos Photo Uploaded By: [name] on 02/13/2017 16:49:16 Wound Measurements Length: (cm) 0 % Reductio Width: (cm) 0 % Reductio Depth: (cm) 0 Epithelial Area: (cm) 0 Tunneling Volume: (cm) 0 Undermini n in Area: 100% n in Volume: 100% ization: Large (67-100%) : No ng: No Wound Description Classification: Grade 1 Wound Margin: Distinct, outline attached Exudate Amount: None Present Foul Odor After Cleansing: No Slough/Fibrino No Wound Bed Granulation Amount: None Present (0%) Necrotic Amount: None Present (0%) Periwound Skin Texture Texture Color Older, Brittley S. ([id number]) No Abnormalities Noted: No No Abnormalities Noted: No Moisture Temperature / Pain No Abnormalities Noted: No Temperature: No Abnormality Tenderness on Palpation: Yes Wound Preparation Ulcer Cleansing: Rinsed/Irrigated with Saline Topical Anesthetic Applied: None Electronic Signature(s) Signed: 02/13/2017 5:05:30 PM By: [name] Entered By: [name] on 02/13/2017 12:56:01 Padia, Ciji S.  ([id number]) -------------------------------------------------------------------------------- Wound Assessment Details Patient Name: Kochanski, Amar S. Date of Service: 02/13/2017 12:30 PM Medical Record Number: [id number] Patient Account Number: [id number] Date of Birth/Sex: 1955-09-25 (61 y.o. Female) Treating RN: [name], [name] Primary Care Charli Halle: [name] Other Clinician: Referring Kinzee Happel: [name] Treating Cahlil Sattar/Extender: [name] in Treatment: 18 Wound Status Wound Number: 13 Primary Diabetic Wound/Ulcer of the Lower Etiology: Extremity Wound Location: Right, Medial Foot Wound Open Wounding Event: Gradually Appeared Status: Date Acquired: 12/14/2016 Comorbid Glaucoma, Anemia, Coronary Artery Weeks Of Treatment: 0 History: Disease, Hypertension, Type II Clustered Wound: No Diabetes, End Stage Renal Disease, Osteoarthritis, Neuropathy Photos Photo Uploaded By: [name] on 02/13/2017 16:49:48 Wound Measurements Length: (cm) 0.6 Width: (cm) 1.5 Depth: (cm) 0.2 Area: (cm) 0.707 Volume: (cm) 0.141 % Reduction in Area: % Reduction in Volume: Epithelialization: None Tunneling: No Undermining: No Wound Description Classification: Grade 1 Foul Odor Aft Wound Margin: Distinct, outline attached Slough/Fibrin Exudate Amount: Large Exudate Type: Serous Exudate Color: amber er Cleansing: No o Yes Wound Bed Granulation Amount: None Present (0%) Necrotic Amount: Large (67-100%) Necrotic Quality: Adherent Slough Marinello, Nneka S. ([id number]) Periwound Skin Texture Texture Color No Abnormalities Noted: No No Abnormalities Noted: No Erythema: Yes Moisture Erythema Location: Circumferential No Abnormalities Noted: No Maceration: Yes Temperature / Pain Temperature: No Abnormality Tenderness on Palpation: Yes Wound Preparation Ulcer Cleansing: Rinsed/Irrigated with Saline Topical Anesthetic Applied: Other: lidocaine 4%, Treatment  Notes Wound #13 (Right, Medial Foot) 1. Cleansed with: Clean wound with Normal Saline  2. Anesthetic Topical Lidocaine 4% cream to wound bed prior to debridement 3. Peri-wound Care: Skin Prep 4. Dressing Applied: Santyl Ointment 5. Secondary Dressing Applied Bordered Foam Dressing Dry Gauze Electronic Signature(s) Signed: 02/13/2017 5:05:30 PM By: [name] Entered By: [name] on 02/13/2017 12:50:06 Whitham, [name] ([id number]) -------------------------------------------------------------------------------- [place] Details Patient Name: [name] Date of Service: 02/13/2017 12:30 PM Medical Record Number: [id number] Patient Account Number: [id number] Date of Birth/Sex: 04-19-1956 (61 y.o. Female) Treating RN: [name], [name] Primary Care Corderius Saraceni: [name] Other Clinician: Referring Neliah Cuyler: [name] Treating Romello Hoehn/Extender: [name] in Treatment: 18 Vital Signs Time Taken: 12:39 Temperature (F): 97.7 Height (in): 61 Pulse (bpm): 89 Weight (lbs): 160 Respiratory Rate (breaths/min): 20 Body Mass Index (BMI): 30.2 Blood Pressure (mmHg): 170/78 Reference Range: 80 - 120 mg / dl Notes Made Dr. Con Memos aware of pts BP. Electronic Signature(s) Signed: 02/13/2017 5:05:30 PM By: [name] Entered By: [name] on 02/13/2017 12:41:39

## 2017-02-14 NOTE — Progress Notes (Signed)
CHALA, GUL ([id number]) Visit Report for 02/13/2017 Chief Complaint Document Details [name], [name]. Date of Service: 02/13/2017 12:30 PM Medical Record Number: [id number] Patient Account Number: [id number] Date of Birth/Sex: 05-17-1956 (61 y.o. Female) Treating RN: Carolyne Fiscal, Debi Primary Care Provider: [name] Other Clinician: Referring Provider: [name] Treating Provider/Extender: Frann Rider in Treatment: 18 Information Obtained from: Patient Chief Complaint Patients presents for treatment of an open diabetic ulcer right lateral ankle new wound to the right knee and a blister to the left lateral LE Electronic Signature(s) Signed: 02/13/2017 1:08:03 PM By: [name], [name] Entered By: [name] on 02/13/2017 13:08:02 Bowring, [name] ([id number]) -------------------------------------------------------------------------------- Debridement Details [name]. Date of Service: 02/13/2017 12:30 PM Medical Record Number: [id number] Patient Account Number: [id number] Date of Birth/Sex: 1956/03/26 (61 y.o. Female) Treating RN: Carolyne Fiscal, Debi Primary Care Provider: [name] Other Clinician: Referring Provider: [name] Treating Provider/Extender: Frann Rider in Treatment: 18 Debridement Performed for Wound #10 Right,Lateral Malleolus Assessment: Performed By: Physician [name], MD Debridement: Debridement Pre-procedure Verification/Time Out Yes - 12:54 Taken: Start Time: 12:56 Pain Control: Lidocaine 4% Topical Solution Level: Skin/Subcutaneous Tissue Total Area Debrided (L x 0.3 (cm) x 0.3 (cm) = 0.09 (cm) W): Tissue and other Viable, Non-Viable, Exudate, Fibrin/Slough, Subcutaneous material debrided: Instrument: Curette Bleeding: Minimum Hemostasis Achieved: Pressure End Time: 12:57 Procedural Pain: 0 Post Procedural Pain: 0 Response to Treatment: Procedure was tolerated well Post  Debridement Measurements of Total Wound Length: (cm) 0.3 Width: (cm) 0.3 Depth: (cm) 0.2 Volume: (cm) 0.014 Character of Wound/Ulcer Post Requires Further Debridement Debridement: Post Procedure Diagnosis Same as Pre-procedure Electronic Signature(s) Signed: 02/13/2017 1:07:46 PM By: [name], [name] Signed: 02/13/2017 5:05:30 PM By: Alric Quan Entered By: [name] on 02/13/2017 13:07:46 Murton, [name] ([id number]) -------------------------------------------------------------------------------- Debridement Details Patient Name: [name], [name] Date of Service: 02/13/2017 12:30 PM Medical Record Number: [id number] Patient Account Number: [id number] Date of Birth/Sex: 11/19/1955 (61 y.o. Female) Treating RN: Carolyne Fiscal, Debi Primary Care Provider: [name] Other Clinician: Referring Provider: [name] Treating Provider/Extender: Frann Rider in Treatment: 18 Debridement Performed for Wound #13 Right,Medial Foot Assessment: Performed By: Physician [name], MD Debridement: Debridement Severity of Tissue Pre Fat layer exposed Debridement: Pre-procedure Verification/Time Out Yes - 12:54 Taken: Start Time: 12:55 Pain Control: Lidocaine 4% Topical Solution Level: Skin/Subcutaneous Tissue Total Area Debrided (L x 0.6 (cm) x 1.5 (cm) = 0.9 (cm) W): Tissue and other Viable, Non-Viable, Exudate, Fibrin/Slough, Subcutaneous material debrided: Instrument: Curette Bleeding: Minimum Hemostasis Achieved: Pressure End Time: 12:56 Procedural Pain: 0 Post Procedural Pain: 0 Response to Treatment: Procedure was tolerated well Post Debridement Measurements of Total Wound Length: (cm) 0.6 Width: (cm) 1.5 Depth: (cm) 0.2 Volume: (cm) 0.141 Character of Wound/Ulcer Post Requires Further Debridement Debridement: Severity of Tissue Post Debridement: Fat layer exposed Post Procedure Diagnosis Same as Pre-procedure Electronic  Signature(s) Signed: 02/13/2017 1:07:55 PM By: [name], [name] Signed: 02/13/2017 5:05:30 PM By: Zella Richer ([id number]) Entered By: [name] on 02/13/2017 13:07:55 Tribbett, [name] ([id number]) -------------------------------------------------------------------------------- HPI Details Patient Name: [name], [name] Date of Service: 02/13/2017 12:30 PM Medical Record Number: [id number] Patient Account Number: [id number] Date of Birth/Sex: 1955-10-06 (61 y.o. Female) Treating RN: Carolyne Fiscal, Debi Primary Care Provider: [name] Other Clinician: Referring Provider: [name] Treating Provider/Extender: Frann Rider in Treatment: 18 History of Present Illness Location: right lateral ankle Quality: Patient reports No Pain. Severity: Patient states wound (s) are  getting better. Duration: Patient has had the wound for > 1 months prior to seeking treatment at the wound center Context: The wound appeared gradually over time, and she thinks she might have hit it with a walker Modifying Factors: Patient is currently on renal dialysis and receives treatments 3 times weekly HPI Description: 61 year old patient known to our practice from several previous visits for various problems now comes back with an ulcerated area on her right lateral malleolus which she's had for about a month and she thinks she may have hit it with her walker. No other problems and no x-ray of this has been taken. She has a past medical history of atrial fibrillation, carotid artery disease, CHF, chronic kidney disease on hemodialysis with a functioning AV fistula seen by Dr. Corene Cornea dew, diabetes mellitus with peripheral neuropathy, pancreatitis and is status post abdominal hysterectomy, AV fistula placement in June 2016, back surgery, cholecystectomy, coronary angioplasty,several surgeries for AV fistula, and dialysis catheters. She has never been a smoker. 10/17/2016 -- had a  right ankle x-ray which showed no acute pathology of the right ankle. Arterial vascular calcifications are noted and there is generalized osteopenia and demineralization 11/11/16 Patient appears to be doing fairly well with regard to her right ankle wound. She has been tolerating the dressing changes with the Santyl that does seem to have loosened up the slough a bit. She has also been tolerating the debridement's when they have been performed. She tells me that she does tend to lay on her right side and this seems to be aggravating the issue. She does not have a Prevalon boot for use at this facility. Fortunately she is not having significant pain. 01/06/17 on evaluation today patient's right lateral ankle wound appears to be doing fairly well. There was some Slough covering although there is no evidence of infection and she does not seem to have significant pain. She is pleased with how this is progressing compared to where it was in the beginning but wishes it would completely close. 01/20/17 on evaluation today patient's wound appears to be doing better. She did have some Slough covering the wound but overall there did not appear to be a significant amount of erythema and definitely no read streaking from the wound itself. She also did not have significant pain. 01/27/17 on evaluation today patient appears to be doing okay although she unfortunately had a recent fall two days ago and during this fall she injured her right knee as well as her left lateral leg where there is a blister there. There is an abrasion over the knee. Unfortunately the knee has been giving her some discomfort and pain. Her right lateral ankle wound does appear to be doing a little bit better in my opinion in regard to the wound bed where I no longer visualize faster today but rather she does have pink granulation Mcelhinny, Frona S. ([id number]) tissue. There is no evidence of infection. 02/13/2017 -- about 3 weeks ago she had  had a fall and injured the medial part of her right foot which is now a full-fledged ulcerated area with slough. We have established this as a new wound today. ===== Old notes: 61 year old patient who is known to be a diabetic and has end-stage renal disease has had several comorbidities including coronary artery disease, hypertension, hyperlipidemia, pancreatitis, anemia, previous history of hysterectomy, cholecystectomy, left-sided salivary gland excision, bilateral cataract surgery,Peritoneal dialysis catheter, hemodialysis catheter. the area on the back has also been caused by instant pressure she  used to sleep on a recliner all day and has significant kyphoscoliosis. As far as the wound on her right lower extremity she's not sure how this blister occurred but she thought it has been there for about 2 weeks. No recent blood investigations available and no recent hemoglobin A1c. 12/30/2014 -- she is an assisted living facility but I believe the nurses that have not followed instructions as she had some cream applied on her back and there was a different dressing. Last week she's had a AV fistula placed on her left forearm. 01/13/2015 -- she has had some localized infection at the port site and she's been on doxycycline for this. 02/05/2015 - he has developed a small blister on her right lower extremity. 02/10/2015 -- she has developed another small blister on her right anterior chest wall in the area where she's had tape for her dialysis access. this may just be injury caused by a tape burn. She had a dermatology opinion and they have taken a biopsy of her skin. She also had a left brachial AV fistula placed this week. ========= Electronic Signature(s) Signed: 02/13/2017 1:08:43 PM By: [name], [name] Entered By: [name] on 02/13/2017 13:08:42 Garron, [name] ([id number]) -------------------------------------------------------------------------------- Physical Exam  Details Patient Name: Zecca, 34. Date of Service: 02/13/2017 12:30 PM Medical Record Number: [id number] Patient Account Number: [id number] Date of Birth/Sex: 1955-10-20 (61 y.o. Female) Treating RN: Carolyne Fiscal, Debi Primary Care Provider: [name] Other Clinician: Referring Provider: [name] Treating Provider/Extender: Frann Rider in Treatment: 18 Constitutional . Pulse regular. Respirations normal and unlabored. Afebrile. . Eyes Nonicteric. Reactive to light. Ears, Nose, Mouth, and Throat Lips, teeth, and gums WNL.Marland Kitchen Moist mucosa without lesions. Neck supple and nontender. No palpable supraclavicular or cervical adenopathy. Normal sized without goiter. Respiratory WNL. No retractions.. Cardiovascular Pedal Pulses WNL. No clubbing, cyanosis or edema. Lymphatic No adneopathy. No adenopathy. No adenopathy. Musculoskeletal Adexa without tenderness or enlargement.. Digits and nails w/o clubbing, cyanosis, infection, petechiae, ischemia, or inflammatory conditions.. Integumentary (Hair, Skin) No suspicious lesions. No crepitus or fluctuance. No peri-wound warmth or erythema. No masses.Marland Kitchen Psychiatric Judgement and insight Intact.. No evidence of depression, anxiety, or agitation.. Notes the wound on the lateral malleolus is looking very clean and has minimal subcutaneous debris which I sharply removed with a #3 curet and below this there is healthy granulation tissue. The new wound on the right medial foot has a lot of necrotic debris and I have used a #3 curet and sharply removed a lot of this. She will need further chemical debridement with Santyl ointment. Electronic Signature(s) Signed: 02/13/2017 1:09:51 PM By: [name], [name] Entered By: [name] on 02/13/2017 13:09:50 Lamore, [name] ([id number]) -------------------------------------------------------------------------------- Physician Orders Details [name]. Date of  Service: 02/13/2017 12:30 PM Medical Record Number: [id number] Patient Account Number: [id number] Date of Birth/Sex: 09-03-1955 (61 y.o. Female) Treating RN: Carolyne Fiscal, Debi Primary Care Provider: [name] Other Clinician: Referring Provider: [name] Treating Provider/Extender: Frann Rider in Treatment: 76 Verbal / Phone Orders: Yes Clinician: Carolyne Fiscal, Debi Read Back and Verified: Yes Diagnosis Coding Wound Cleansing Wound #10 Right,Lateral Malleolus o Clean wound with Normal Saline. o Cleanse wound with mild soap and water o May Shower, gently pat wound dry prior to applying new dressing. Wound #13 Right,Medial Foot o Clean wound with Normal Saline. o Cleanse wound with mild soap and water o May Shower, gently pat wound dry prior to applying new dressing. Anesthetic Wound #10 Right,Lateral  Malleolus o Topical Lidocaine 4% cream applied to wound bed prior to debridement - for clinic use Wound #13 Right,Medial Foot o Topical Lidocaine 4% cream applied to wound bed prior to debridement - for clinic use Skin Barriers/Peri-Wound Care Wound #10 Right,Lateral Malleolus o Skin Prep Wound #13 Right,Medial Foot o Skin Prep Primary Wound Dressing Wound #10 Right,Lateral Malleolus o Prisma Ag - moisten with saline Wound #13 Right,Medial Foot o Santyl Ointment Secondary Dressing Wound #10 Right,Lateral Malleolus o Dry Gauze o Boardered Foam Dressing Dooley, Jovon S. ([id number]) Wound #13 Right,Medial Foot o Dry Gauze o Boardered Foam Dressing Dressing Change Frequency Wound #10 Right,Lateral Malleolus o Change dressing every day. Wound #13 Right,Medial Foot o Change dressing every day. Follow-up Appointments Wound #10 Right,Lateral Malleolus o Return Appointment in 1 week. Wound #13 Right,Medial Foot o Return Appointment in 1 week. Edema Control Wound #10 Right,Lateral Malleolus o Patient to wear own compression  stockings o Elevate legs to the level of the heart and pump ankles as often as possible Wound #13 Right,Medial Foot o Patient to wear own compression stockings o Elevate legs to the level of the heart and pump ankles as often as possible Electronic Signature(s) Signed: 02/13/2017 4:12:07 PM By: [name], [name] Signed: 02/13/2017 5:05:30 PM By: Alric Quan Entered By: Alric Quan on 02/13/2017 12:59:36 Bogden, [name] ([id number]) -------------------------------------------------------------------------------- Problem List Details Patient Name: Neary, Kayliah S. Date of Service: 02/13/2017 12:30 PM Medical Record Number: [id number] Patient Account Number: [id number] Date of Birth/Sex: [birth year] (61 y.o. Female) Treating RN: Carolyne Fiscal, Debi Primary Care Provider: [name] Other Clinician: Referring Provider: [name] Treating Provider/Extender: Frann Rider in Treatment: 18 Active Problems ICD-10 Encounter Code Description Active Date Diagnosis E11.621 Type 2 diabetes mellitus with foot ulcer 10/10/2016 Yes L97.312 Non-pressure chronic ulcer of right ankle with fat layer 10/10/2016 Yes exposed S80.211A Abrasion, right knee, initial encounter 01/27/2017 Yes N18.6 End stage renal disease 10/10/2016 Yes Z99.2 Dependence on renal dialysis 10/10/2016 Yes L97.512 Non-pressure chronic ulcer of other part of right foot with 02/13/2017 Yes fat layer exposed Inactive Problems Resolved Problems Electronic Signature(s) Signed: 02/13/2017 1:07:23 PM By: [name], [name] Entered By: [name] on 02/13/2017 13:07:22 Humble, [name] ([id number]) -------------------------------------------------------------------------------- Progress Note Details Patient Name: Carbine, Abcde S. Date of Service: 02/13/2017 12:30 PM Medical Record Number: [id number] Patient Account Number: [id number] Date of Birth/Sex: 1955/11/22 (61 y.o. Female) Treating RN: Carolyne Fiscal,  Debi Primary Care Provider: [name] Other Clinician: Referring Provider: [name] Treating Provider/Extender: Frann Rider in Treatment: 18 Subjective Chief Complaint Information obtained from Patient Patients presents for treatment of an open diabetic ulcer right lateral ankle new wound to the right knee and a blister to the left lateral LE History of Present Illness (HPI) The following HPI elements were documented for the patient's wound: Location: right lateral ankle Quality: Patient reports No Pain. Severity: Patient states wound (s) are getting better. Duration: Patient has had the wound for > 1 months prior to seeking treatment at the wound center Context: The wound appeared gradually over time, and she thinks she might have hit it with a walker Modifying Factors: Patient is currently on renal dialysis and receives treatments 3 times weekly 61 year old patient known to our practice from several previous visits for various problems now comes back with an ulcerated area on her right lateral malleolus which she's had for about a month and she thinks she may have hit it with her walker. No other problems and no x-ray of  this has been taken. She has a past medical history of atrial fibrillation, carotid artery disease, CHF, chronic kidney disease on hemodialysis with a functioning AV fistula seen by Dr. Corene Cornea dew, diabetes mellitus with peripheral neuropathy, pancreatitis and is status post abdominal hysterectomy, AV fistula placement in June 2016, back surgery, cholecystectomy, coronary angioplasty,several surgeries for AV fistula, and dialysis catheters. She has never been a smoker. 10/17/2016 -- had a right ankle x-ray which showed no acute pathology of the right ankle. Arterial vascular calcifications are noted and there is generalized osteopenia and demineralization 11/11/16 Patient appears to be doing fairly well with regard to her right ankle wound. She has been  tolerating the dressing changes with the Santyl that does seem to have loosened up the slough a bit. She has also been tolerating the debridement's when they have been performed. She tells me that she does tend to lay on her right side and this seems to be aggravating the issue. She does not have a Prevalon boot for use at this facility. Fortunately she is not having significant pain. 01/06/17 on evaluation today patient's right lateral ankle wound appears to be doing fairly well. There was some Slough covering although there is no evidence of infection and she does not seem to have significant pain. She is pleased with how this is progressing compared to where it was in the beginning but wishes it would completely close. 01/20/17 on evaluation today patient's wound appears to be doing better. She did have [address] [place], [place]. ([id number]) the wound but overall there did not appear to be a significant amount of erythema and definitely no read streaking from the wound itself. She also did not have significant pain. 01/27/17 on evaluation today patient appears to be doing okay although she unfortunately had a recent fall two days ago and during this fall she injured her right knee as well as her left lateral leg where there is a blister there. There is an abrasion over the knee. Unfortunately the knee has been giving her some discomfort and pain. Her right lateral ankle wound does appear to be doing a little bit better in my opinion in regard to the wound bed where I no longer visualize faster today but rather she does have pink granulation tissue. There is no evidence of infection. 02/13/2017 -- about 3 weeks ago she had had a fall and injured the medial part of her right foot which is now a full-fledged ulcerated area with slough. We have established this as a new wound today. ===== Old notes: 61 year old patient who is known to be a diabetic and has end-stage renal disease has had  several comorbidities including coronary artery disease, hypertension, hyperlipidemia, pancreatitis, anemia, previous history of hysterectomy, cholecystectomy, left-sided salivary gland excision, bilateral cataract surgery,Peritoneal dialysis catheter, hemodialysis catheter. the area on the back has also been caused by instant pressure she used to sleep on a recliner all day and has significant kyphoscoliosis. As far as the wound on her right lower extremity she's not sure how this blister occurred but she thought it has been there for about 2 weeks. No recent blood investigations available and no recent hemoglobin A1c. 12/30/2014 -- she is an assisted living facility but I believe the nurses that have not followed instructions as she had some cream applied on her back and there was a different dressing. Last week she's had a AV fistula placed on her left forearm. 01/13/2015 -- she has had some localized infection at  the port site and she's been on doxycycline for this. 02/05/2015 - he has developed a small blister on her right lower extremity. 02/10/2015 -- she has developed another small blister on her right anterior chest wall in the area where she's had tape for her dialysis access. this may just be injury caused by a tape burn. She had a dermatology opinion and they have taken a biopsy of her skin. She also had a left brachial AV fistula placed this week. ========= Objective Constitutional Pulse regular. Respirations normal and unlabored. Afebrile. [name] ([id number]) Vitals Time Taken: 12:39 PM, Height: 61 in, Weight: 160 lbs, BMI: 30.2, Temperature: 97.7 F, Pulse: 89 bpm, Respiratory Rate: 20 breaths/min, Blood Pressure: 170/78 mmHg. General Notes: Made Dr. Con Memos aware of pts BP. Eyes Nonicteric. Reactive to light. Ears, Nose, Mouth, and Throat Lips, teeth, and gums WNL.Marland Kitchen Moist mucosa without lesions. Neck supple and nontender. No palpable supraclavicular or cervical  adenopathy. Normal sized without goiter. Respiratory WNL. No retractions.. Cardiovascular Pedal Pulses WNL. No clubbing, cyanosis or edema. Lymphatic No adneopathy. No adenopathy. No adenopathy. Musculoskeletal Adexa without tenderness or enlargement.. Digits and nails w/o clubbing, cyanosis, infection, petechiae, ischemia, or inflammatory conditions.Marland Kitchen Psychiatric Judgement and insight Intact.. No evidence of depression, anxiety, or agitation.. General Notes: the wound on the lateral malleolus is looking very clean and has minimal subcutaneous debris which I sharply removed with a #3 curet and below this there is healthy granulation tissue. The new wound on the right medial foot has a lot of necrotic debris and I have used a #3 curet and sharply removed a lot of this. She will need further chemical debridement with Santyl ointment. Integumentary (Hair, Skin) No suspicious lesions. No crepitus or fluctuance. No peri-wound warmth or erythema. No masses.. Wound #10 status is Open. Original cause of wound was Trauma. The wound is located on the Right,Lateral Malleolus. The wound measures 0.3cm length x 0.3cm width x 0.2cm depth; 0.071cm^2 area and 0.014cm^3 volume. There is Fat Layer (Subcutaneous Tissue) Exposed exposed. There is no tunneling or undermining noted. There is a large amount of serous drainage noted. The wound margin is flat and intact. There is no granulation within the wound bed. There is a large (67-100%) amount of necrotic tissue within the wound bed including Adherent Slough. The periwound skin appearance exhibited: Erythema. The periwound skin appearance did not exhibit: Callus, Crepitus, Excoriation, Induration, Rash, Scarring, Dry/Scaly, Maceration, Atrophie Blanche, Cyanosis, Ecchymosis, Hemosiderin Staining, Mottled, Pallor, Rubor. The surrounding wound skin color is noted with erythema which is circumferential. Periwound temperature was noted as No Abnormality. The  periwound has tenderness on palpation. Wound #11 status is Open. Original cause of wound was Trauma. The wound is located on the Right Knee. Locatelli, [name] ([id number]) The wound measures 0cm length x 0cm width x 0cm depth; 0cm^2 area and 0cm^3 volume. There is no tunneling or undermining noted. There is a none present amount of drainage noted. The wound margin is flat and intact. There is no granulation within the wound bed. There is no necrotic tissue within the wound bed. Periwound temperature was noted as No Abnormality. Wound #12 status is Open. Original cause of wound was Blister. The wound is located on the Left,Lateral Lower Leg. The wound measures 0cm length x 0cm width x 0cm depth; 0cm^2 area and 0cm^3 volume. There is no tunneling or undermining noted. There is a none present amount of drainage noted. The wound margin is distinct with the outline attached to the wound  base. There is no granulation within the wound bed. There is no necrotic tissue within the wound bed. Periwound temperature was noted as No Abnormality. The periwound has tenderness on palpation. Wound #13 status is Open. Original cause of wound was Gradually Appeared. The wound is located on the Right,Medial Foot. The wound measures 0.6cm length x 1.5cm width x 0.2cm depth; 0.707cm^2 area and 0.141cm^3 volume. There is no tunneling or undermining noted. There is a large amount of serous drainage noted. The wound margin is distinct with the outline attached to the wound base. There is no granulation within the wound bed. There is a large (67-100%) amount of necrotic tissue within the wound bed including Adherent Slough. The periwound skin appearance exhibited: Maceration, Erythema. The surrounding wound skin color is noted with erythema which is circumferential. Periwound temperature was noted as No Abnormality. The periwound has tenderness on palpation. 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 There was a Skin/Subcutaneous Tissue Debridement (44010-27253) debridement with total area of 0.09 sq cm performed by [name], MD. with the following instrument(s): Curette to remove Viable and Non-Viable tissue/material including Exudate, Fibrin/Slough, and Subcutaneous after achieving pain control using Lidocaine 4% Topical Solution. A time out was conducted at 12:54, prior to the start of the procedure. A Minimum amount of bleeding was controlled with Pressure. The procedure was tolerated well with a pain Boxer, Adriana S. ([id number]) level of 0 throughout and a pain level of 0 following the procedure. Post Debridement Measurements: 0.3cm length x 0.3cm width x 0.2cm depth; 0.014cm^3 volume. Character of Wound/Ulcer Post Debridement requires further debridement. Post procedure Diagnosis Wound #10: Same as Pre-Procedure Wound #13 Pre-procedure diagnosis of Wound #13 is a Diabetic Wound/Ulcer of the Lower Extremity located on the Right,Medial Foot .Severity of Tissue Pre Debridement is: Fat layer exposed. There was a Skin/Subcutaneous Tissue Debridement ([id number]) debridement with total area of 0.9 sq cm performed by [name], MD. with the following instrument(s): Curette to remove Viable and Non-Viable tissue/material including Exudate, Fibrin/Slough, and Subcutaneous after achieving pain control using Lidocaine 4% Topical Solution. A time out was conducted at 12:54, prior to the start of the procedure. A Minimum amount of bleeding was controlled with  Pressure. The procedure was tolerated well with a pain level of 0 throughout and a pain level of 0 following the procedure. Post Debridement Measurements: 0.6cm length x 1.5cm width x 0.2cm depth; 0.141cm^3 volume. Character of Wound/Ulcer Post Debridement requires further debridement. Severity of Tissue Post Debridement is: Fat layer exposed. Post procedure Diagnosis Wound #13: Same as Pre-Procedure Plan Wound Cleansing: Wound #10 Right,Lateral Malleolus: Clean wound with Normal Saline. Cleanse wound with mild soap and water May Shower, gently pat wound dry prior to applying new dressing. Wound #13 Right,Medial Foot: Clean wound with Normal Saline. Cleanse wound with mild soap and water May Shower, gently pat wound dry prior to applying new dressing. Anesthetic: Wound #10 Right,Lateral Malleolus: Topical Lidocaine 4% cream applied to wound bed prior to debridement - for clinic use Wound #13 Right,Medial Foot: Topical Lidocaine 4% cream applied to wound bed prior to  debridement - for clinic use Skin Barriers/Peri-Wound Care: Wound #10 Right,Lateral Malleolus: Skin Prep Wound #13 Right,Medial Foot: Skin Prep Primary Wound Dressing: Wound #10 Right,Lateral Malleolus: Prisma Ag - moisten with saline Wound #13 Right,Medial Foot: Floyd, Wynee S. ([id number]) Santyl Ointment Secondary Dressing: Wound #10 Right,Lateral Malleolus: Dry Gauze Boardered Foam Dressing Wound #13 Right,Medial Foot: Dry Gauze Boardered Foam Dressing Dressing Change Frequency: Wound #10 Right,Lateral Malleolus: Change dressing every day. Wound #13 Right,Medial Foot: Change dressing every day. Follow-up Appointments: Wound #10 Right,Lateral Malleolus: Return Appointment in 1 week. Wound #13 Right,Medial Foot: Return Appointment in 1 week. Edema Control: Wound #10 Right,Lateral Malleolus: Patient to wear own compression stockings Elevate legs to the level of the heart and pump ankles as often as  possible Wound #13 Right,Medial Foot: Patient to wear own compression stockings Elevate legs to the level of the heart and pump ankles as often as possible several of the bruises and the wound on her right lateral malleolus has improved over the last few weeks and I have recommended: 1. Santyl ointment locally to be applied daily with a bordered foam, the new wound on her right medial foot. 2. Prisma AG to the right lateral malleolus 3. Offloading has been discussed with her 4. adequate protein, vitamin A, vitamin C and zinc Electronic Signature(s) Signed: 02/13/2017 1:11:06 PM By: [name], [name] Entered By: [name] on 02/13/2017 13:11:05 Luthi, [name] ([id number]) -------------------------------------------------------------------------------- SuperBill Details Patient Name: Pianka, Sundai S. Date of Service: 02/13/2017 Medical Record Number: [id number] Patient Account Number: [id number] Date of Birth/Sex: 12-02-1955 (61 y.o. Female) Treating RN: Carolyne Fiscal, Debi Primary Care Provider: [name] Other Clinician: Referring Provider: [name] Treating Provider/Extender: Frann Rider in Treatment: 18 Diagnosis Coding ICD-10 Codes Code Description E11.621 Type 2 diabetes mellitus with foot ulcer L97.312 Non-pressure chronic ulcer of right ankle with fat layer exposed S80.211A Abrasion, right knee, initial encounter N18.6 End stage renal disease Z99.2 Dependence on renal dialysis L97.512 Non-pressure chronic ulcer of other part of right foot with fat layer exposed Facility Procedures CPT4 Code Description: [id number] - DEB SUBQ TISSUE 20 SQ CM/< ICD-10 Description Diagnosis E11.621 Type 2 diabetes mellitus with foot ulcer L97.312 Non-pressure chronic ulcer of right ankle with fat l L97.512 Non-pressure chronic ulcer of other part  of right fo Z99.2 Dependence on renal dialysis Modifier: ayer exposed ot with fat la Quantity: 1 yer exposed Physician  Procedures CPT4 Code Description: [id number] - WC PHYS LEVEL 3 - EST PT ICD-10 Description Diagnosis E11.621 Type 2 diabetes mellitus with foot ulcer L97.512 Non-pressure chronic ulcer of other part of right fo Modifier: 65 ot with fat lay Quantity: 1 er exposed CPT4 Code Description: [id number] - WC PHYS SUBQ TISS 20 SQ CM ICD-10 Description Diagnosis E11.621 Type 2 diabetes mellitus with foot ulcer L97.312 Non-pressure chronic ulcer of right ankle with fat l L97.512 Non-pressure chronic ulcer of other part  of right fo Z99.2 Dependence on renal dialysis Claggett, MIRHA BRUCATO. ([id number]) Modifier: ayer exposed ot with fat lay Quantity: 1 er exposed Electronic Signature(s) Signed: 02/13/2017 1:11:29 PM By: [name], [name] Entered By: [name] on 02/13/2017 13:11:29

## 2017-02-17 ENCOUNTER — Ambulatory Visit (INDEPENDENT_AMBULATORY_CARE_PROVIDER_SITE_OTHER): Payer: Medicare Other | Admitting: Vascular Surgery

## 2017-02-17 ENCOUNTER — Ambulatory Visit (INDEPENDENT_AMBULATORY_CARE_PROVIDER_SITE_OTHER): Payer: Medicare Other

## 2017-02-20 ENCOUNTER — Encounter: Payer: Medicare Other | Attending: Physician Assistant | Admitting: Physician Assistant

## 2017-02-20 DIAGNOSIS — E1140 Type 2 diabetes mellitus with diabetic neuropathy, unspecified: Secondary | ICD-10-CM | POA: Insufficient documentation

## 2017-02-20 DIAGNOSIS — I132 Hypertensive heart and chronic kidney disease with heart failure and with stage 5 chronic kidney disease, or end stage renal disease: Secondary | ICD-10-CM | POA: Insufficient documentation

## 2017-02-20 DIAGNOSIS — Z9861 Coronary angioplasty status: Secondary | ICD-10-CM | POA: Insufficient documentation

## 2017-02-20 DIAGNOSIS — I4891 Unspecified atrial fibrillation: Secondary | ICD-10-CM | POA: Diagnosis not present

## 2017-02-20 DIAGNOSIS — W19XXXA Unspecified fall, initial encounter: Secondary | ICD-10-CM | POA: Insufficient documentation

## 2017-02-20 DIAGNOSIS — I509 Heart failure, unspecified: Secondary | ICD-10-CM | POA: Diagnosis not present

## 2017-02-20 DIAGNOSIS — Z992 Dependence on renal dialysis: Secondary | ICD-10-CM | POA: Diagnosis not present

## 2017-02-20 DIAGNOSIS — E11621 Type 2 diabetes mellitus with foot ulcer: Secondary | ICD-10-CM | POA: Diagnosis present

## 2017-02-20 DIAGNOSIS — N186 End stage renal disease: Secondary | ICD-10-CM | POA: Diagnosis not present

## 2017-02-20 DIAGNOSIS — E1122 Type 2 diabetes mellitus with diabetic chronic kidney disease: Secondary | ICD-10-CM | POA: Diagnosis not present

## 2017-02-20 DIAGNOSIS — L97512 Non-pressure chronic ulcer of other part of right foot with fat layer exposed: Secondary | ICD-10-CM | POA: Insufficient documentation

## 2017-02-20 DIAGNOSIS — S80211A Abrasion, right knee, initial encounter: Secondary | ICD-10-CM | POA: Diagnosis not present

## 2017-02-20 DIAGNOSIS — L97312 Non-pressure chronic ulcer of right ankle with fat layer exposed: Secondary | ICD-10-CM | POA: Insufficient documentation

## 2017-02-20 DIAGNOSIS — I2510 Atherosclerotic heart disease of native coronary artery without angina pectoris: Secondary | ICD-10-CM | POA: Insufficient documentation

## 2017-02-21 NOTE — Progress Notes (Signed)
[name] ([id number]) Visit Report for 02/20/2017 Chief Complaint Document Details [name], [name]. Date of Service: 02/20/2017 8:45 AM Medical Record Number: [id number] Patient Account Number: [id number] Date of Birth/Sex: 10-09-1955 (61 y.o. Female) Treating RN: Carolyne Fiscal, Debi Primary Care Provider: [name] Other Clinician: Referring Provider: [name] Treating Provider/Extender: Melburn Hake, Kieara Schwark [name]: 19 Information Obtained from: Patient Chief Complaint Patients presents for treatment of an open diabetic ulcer right lateral ankle new wound to the right knee and a blister to the left lateral LE Electronic Signature(s) Signed: 02/20/2017 4:13:08 PM By: Worthy Keeler PA-C Entered By: Worthy Keeler on 02/20/2017 10:22:42 [name] ([id number]) -------------------------------------------------------------------------------- Debridement Details [name]. Date of Service: 02/20/2017 8:45 AM Medical Record Number: [id number] Patient Account Number: [id number] Date of Birth/Sex: 11/15/1955 (61 y.o. Female) Treating RN: Carolyne Fiscal, Debi Primary Care Provider: [name] Other Clinician: Referring Provider: [name] Treating Provider/Extender: Melburn Hake, Sesilia Poucher [name]: 19 Debridement Performed for Wound #13 Right,Medial Foot Assessment: Performed By: Physician STONE III, Kioni Stahl E., PA-C Debridement: Debridement Severity of Tissue Pre Fat layer exposed Debridement: Pre-procedure Verification/Time Out Yes - 09:27 Taken: Start Time: 09:28 Pain Control: Lidocaine 4% Topical Solution Level: Skin/Subcutaneous Tissue Total Area Debrided (L x 0.5 (cm) x 1.5 (cm) = 0.75 (cm) W): Tissue and other Viable, Non-Viable, Exudate, Fibrin/Slough, Subcutaneous material debrided: Instrument: Curette Bleeding: Minimum Hemostasis Achieved: Pressure End Time: 09:30 Procedural Pain: 0 Post Procedural Pain:  0 Response to Treatment: Procedure was tolerated well Post Debridement Measurements of Total Wound Length: (cm) 0.5 Width: (cm) 1.5 Depth: (cm) 0.3 Volume: (cm) 0.177 Character of Wound/Ulcer Post Requires Further Debridement Debridement: Severity of Tissue Post Debridement: Fat layer exposed Post Procedure Diagnosis Same as Pre-procedure Electronic Signature(s) Signed: 02/20/2017 4:13:08 PM By: Worthy Keeler PA-C Signed: 02/20/2017 4:43:25 PM By: Zella Richer ([id number]) Entered By: Alric Quan on 02/20/2017 09:30:16 Darin, [name] ([id number]) -------------------------------------------------------------------------------- Debridement Details Patient Name: [name], [name] Date of Service: 02/20/2017 8:45 AM Medical Record Number: [id number] Patient Account Number: [id number] Date of Birth/Sex: 09/16/1955 (61 y.o. Female) Treating RN: Carolyne Fiscal, Debi Primary Care Provider: [name] Other Clinician: Referring Provider: [name] Treating Provider/Extender: Melburn Hake, Merdith Boyd [name]: 19 Debridement Performed for Wound #10 Right,Lateral Malleolus Assessment: Performed By: Physician STONE III, Erhard Senske E., PA-C Debridement: Debridement Pre-procedure Verification/Time Out Yes - 09:27 Taken: Start Time: 09:30 Pain Control: Lidocaine 4% Topical Solution Level: Skin/Subcutaneous Tissue Total Area Debrided (L x 0.4 (cm) x 0.3 (cm) = 0.12 (cm) W): Tissue and other Viable, Non-Viable, Exudate, Fibrin/Slough, Subcutaneous material debrided: Instrument: Curette Bleeding: Minimum Hemostasis Achieved: Pressure End Time: 09:32 Procedural Pain: 0 Post Procedural Pain: 0 Response to Treatment: Procedure was tolerated well Post Debridement Measurements of Total Wound Length: (cm) 0.4 Width: (cm) 0.3 Depth: (cm) 0.3 Volume: (cm) 0.028 Character of Wound/Ulcer Post Requires Further Debridement Debridement: Post Procedure  Diagnosis Same as Pre-procedure Electronic Signature(s) Signed: 02/20/2017 4:13:08 PM By: Worthy Keeler PA-C Signed: 02/20/2017 4:43:25 PM By: Alric Quan Entered By: Alric Quan on 02/20/2017 09:31:26 [name], [name] ([id number]) -------------------------------------------------------------------------------- HPI Details [name]. Date of Service: 02/20/2017 8:45 AM Medical Record Number: [id number] Patient Account Number: [id number] Date of Birth/Sex: 1955/10/11 (61 y.o. Female) Treating RN: Carolyne Fiscal, Debi Primary Care Provider: [name] Other Clinician: Referring Provider: [name] Treating Provider/Extender: STONE III, Nevelyn Mellott [name]: 19 History of Present Illness Location: right lateral ankle Quality: Patient  reports No Pain. Severity: Patient states wound (s) are getting better. Duration: Patient has had the wound for > 1 months prior to seeking treatment at the wound center Context: The wound appeared gradually over time, and she thinks she might have hit it with a walker Modifying Factors: Patient is currently on renal dialysis and receives treatments 3 times weekly HPI Description: 61 year old patient known to our practice from several previous visits for various problems now comes back with an ulcerated area on her right lateral malleolus which she's had for about a month and she thinks she may have hit it with her walker. No other problems and no x-ray of this has been taken. She has a past medical history of atrial fibrillation, carotid artery disease, CHF, chronic kidney disease on hemodialysis with a functioning AV fistula seen by Dr. Corene Cornea dew, diabetes mellitus with peripheral neuropathy, pancreatitis and is status post abdominal hysterectomy, AV fistula placement in June 2016, back surgery, cholecystectomy, coronary angioplasty,several surgeries for AV fistula, and dialysis catheters. She has never been a  smoker. 10/17/2016 -- had a right ankle x-ray which showed no acute pathology of the right ankle. Arterial vascular calcifications are noted and there is generalized osteopenia and demineralization 11/11/16 Patient appears to be doing fairly well with regard to her right ankle wound. She has been tolerating the dressing changes with the Santyl that does seem to have loosened up the slough a bit. She has also been tolerating the debridement's when they have been performed. She tells me that she does tend to lay on her right side and this seems to be aggravating the issue. She does not have a Prevalon boot for use at this facility. Fortunately she is not having significant pain. 01/06/17 on evaluation today patient's right lateral ankle wound appears to be doing fairly well. There was some Slough covering although there is no evidence of infection and she does not seem to have significant pain. She is pleased with how this is progressing compared to where it was in the beginning but wishes it would completely close. 01/20/17 on evaluation today patient's wound appears to be doing better. She did have some Slough covering the wound but overall there did not appear to be a significant amount of erythema and definitely no read streaking from the wound itself. She also did not have significant pain. 01/27/17 on evaluation today patient appears to be doing okay although she unfortunately had a recent fall two days ago and during this fall she injured her right knee as well as her left lateral leg where there is a blister there. There is an abrasion over the knee. Unfortunately the knee has been giving her some discomfort and pain. Her right lateral ankle wound does appear to be doing a little bit better in my opinion in regard to the wound bed where I no longer visualize faster today but rather she does have pink granulation Birchmeier, [name] ([id number]) tissue. There is no evidence of infection. 02/13/2017  -- about 3 weeks ago she had had a fall and injured the medial part of her right foot which is now a full-fledged ulcerated area with slough. We have established this as a new wound today. 02/20/17 on evaluation today patient's wounds appeared to be doing about the same although the medial actually is slough covered. Fortunately or infection. She has no knowledge of, vomiting, diarrhea to be getting more significant issue such as sepsis and does not appear to have any pain. With that  being said she does not appear to be as alert and oriented action has been in the past. This is obviously a mental shift compared to when I had previously seen her. Unfortunately her husband over the past week and I think this may be weighing heavy on her as well. ===== Old notes: 61 year old patient who is known to be a diabetic and has end-stage renal disease has had several comorbidities including coronary artery disease, hypertension, hyperlipidemia, pancreatitis, anemia, previous history of hysterectomy, cholecystectomy, left-sided salivary gland excision, bilateral cataract surgery,Peritoneal dialysis catheter, hemodialysis catheter. the area on the back has also been caused by instant pressure she used to sleep on a recliner all day and has significant kyphoscoliosis. As far as the wound on her right lower extremity she's not sure how this blister occurred but she thought it has been there for about 2 weeks. No recent blood investigations available and no recent hemoglobin A1c. 12/30/2014 -- she is an assisted living facility but I believe the nurses that have not followed instructions as she had some cream applied on her back and there was a different dressing. Last week she's had a AV fistula placed on her left forearm. 01/13/2015 -- she has had some localized infection at the port site and she's been on doxycycline for this. 02/05/2015 - he has developed a small blister on her right lower  extremity. 02/10/2015 -- she has developed another small blister on her right anterior chest wall in the area where she's had tape for her dialysis access. this may just be injury caused by a tape burn. She had a dermatology opinion and they have taken a biopsy of her skin. She also had a left brachial AV fistula placed this week. ========= Electronic Signature(s) Signed: 02/20/2017 4:13:08 PM By: Worthy Keeler PA-C Entered By: Worthy Keeler on 02/20/2017 10:25:48 [name] ([id number]) -------------------------------------------------------------------------------- Physical Exam Details Patient Name: [name], [name] Date of Service: 02/20/2017 8:45 AM Medical Record Number: [id number] Patient Account Number: [id number] Date of Birth/Sex: May 10, 1956 (61 y.o. Female) Treating RN: Carolyne Fiscal, Debi Primary Care Provider: [name] Other Clinician: Referring Provider: [name] Treating Provider/Extender: STONE III, Alieyah Spader [name]: 19 Constitutional Obese and well-hydrated in no acute distress. Respiratory normal breathing without difficulty. clear to auscultation bilaterally. Cardiovascular regular rate and rhythm with normal S1, S2. Psychiatric this patient is able to make decisions and demonstrates good insight into disease process. Patient is oriented to person only. pleasant and cooperative. Notes Patient right medial foot wound appears slough covered and did required debridement today which he tolerated without any complication. The lateral malleolus wound also have slough covering the and required debridement and there was no infection apparent. Electronic Signature(s) Signed: 02/20/2017 4:13:08 PM By: Worthy Keeler PA-C Entered By: Worthy Keeler on 02/20/2017 10:27:31 Carlini, [name] ([id number]) -------------------------------------------------------------------------------- Physician Orders Details [name]. Date of Service:  02/20/2017 8:45 AM Medical Record Number: [id number] Patient Account Number: [id number] Date of Birth/Sex: Jan 23, 1956 (61 y.o. 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 o Cleanse wound with mild soap and water o May Shower, gently pat wound dry prior to applying new dressing. Wound #13 Right,Medial Foot o Clean wound with Normal Saline. o Cleanse wound with mild soap and water o May Shower, gently pat wound dry prior to applying new dressing. Anesthetic Wound #10 Right,Lateral Malleolus o Topical Lidocaine 4% cream applied to wound bed prior to debridement - for clinic use Wound #13 Right,Medial Foot o Topical Lidocaine 4% cream applied to wound bed prior to debridement - for clinic use Skin Barriers/Peri-Wound Care Wound #10 Right,Lateral Malleolus o Skin Prep Wound #13 Right,Medial Foot o Skin Prep Primary Wound Dressing [name], [name] ([id number]) Wound #10 Right,Lateral Malleolus o Prisma Ag - (collagen) moisten with saline Wound #13 Right,Medial Foot o Prisma Ag - (collagen) moisten with saline Secondary Dressing Wound #10 Right,Lateral Malleolus o Dry Gauze o Boardered Foam Dressing Wound #13 Right,Medial Foot o Dry Gauze o Boardered Foam Dressing Dressing  Change Frequency Wound #10 Right,Lateral Malleolus o Change dressing every day. Wound #13 Right,Medial Foot o Change dressing every day. Follow-up Appointments Wound #10 Right,Lateral Malleolus o Return Appointment in 1 week. Wound #13 Right,Medial Foot o Return Appointment in 1 week. Edema Control Wound #10 Right,Lateral Malleolus o Patient to wear own compression stockings o Elevate legs to the level of the heart and pump ankles as often as possible Wound #13 Right,Medial Foot o Patient to wear own compression stockings o Elevate legs to the level of the heart and pump ankles as often as possible Electronic Signature(s) Signed: 02/20/2017 4:13:08 PM By: Worthy Keeler PA-C Entered By: Worthy Keeler on 02/20/2017 10:27:59 Dickmann, [name] ([id number]) -------------------------------------------------------------------------------- Problem List Details [name]. Date of Service: 02/20/2017 8:45 AM Medical Record Number: [id number] Patient Account Number: [id number] Date of Birth/Sex: [birth year] (61 y.o. Female) Treating RN: Carolyne Fiscal, Debi Primary Care Provider: [name] Other Clinician: Referring Provider: [name] Treating Provider/Extender: Melburn Hake, Kenia Teagarden [name]: 19 Active Problems ICD-10 Encounter Code Description Active Date Diagnosis E11.621 Type 2 diabetes mellitus with foot ulcer 10/10/2016 Yes L97.312 Non-pressure chronic ulcer of right ankle with fat layer 10/10/2016 Yes exposed S80.211A Abrasion, right knee, initial encounter 01/27/2017 Yes N18.6 End stage renal disease 10/10/2016 Yes Z99.2 Dependence on renal dialysis 10/10/2016 Yes L97.512 Non-pressure chronic ulcer of other part of right foot with 02/13/2017 Yes fat layer exposed Inactive Problems Resolved Problems Electronic Signature(s) Signed: 02/20/2017 4:13:08 PM By: Worthy Keeler PA-C Entered By: Worthy Keeler on 02/20/2017 09:27:01 Hopf, [name] ([id number]) -------------------------------------------------------------------------------- Progress Note Details [name]. Date of Service: 02/20/2017 8:45 AM Medical Record Number: [id number] Patient Account Number: [id number] Date of Birth/Sex: 1956-04-14 (61 y.o. Female) Treating RN: Carolyne Fiscal, Debi Primary Care Provider: [name] Other Clinician: Referring Provider: [name] Treating Provider/Extender: Melburn Hake, Monserrath Junio [name]: 19 Subjective Chief Complaint Information obtained from Patient Patients presents for treatment of an open diabetic ulcer right lateral ankle new wound to the right knee and a blister to the left lateral LE History of Present Illness (HPI) The following HPI elements were documented for the patient's wound: Location: right lateral ankle Quality: Patient reports No Pain. Severity: Patient states wound (s) are getting better. Duration: Patient has had the wound for > 1 months prior to seeking treatment at the wound center Context: The wound appeared  gradually over time, and she thinks she might have hit it with a walker Modifying Factors: Patient is currently on renal dialysis and receives treatments 3 times weekly 61 year old patient known to our practice from several previous visits for various problems now comes back with an ulcerated area on her right lateral malleolus which she's had for about a month and she thinks she may have hit it with her walker. No other problems and no x-ray of this has been taken. She has a past medical history of atrial fibrillation, carotid artery disease, CHF, chronic kidney disease on hemodialysis with a functioning AV fistula seen by Dr. Corene Cornea dew, diabetes mellitus with peripheral neuropathy, pancreatitis and is status post abdominal hysterectomy, AV fistula placement in June 2016, back surgery, cholecystectomy, coronary angioplasty,several surgeries for AV fistula, and  dialysis catheters. She has never been a smoker. 10/17/2016 -- had a right ankle x-ray which showed no acute pathology of the right ankle. Arterial vascular calcifications are noted and there is generalized osteopenia and demineralization 11/11/16 Patient appears to be doing fairly well with regard to her right ankle wound. She has been tolerating the dressing changes with the Santyl that does seem to have loosened up the slough a bit. She has also been tolerating the debridement's when they have been performed. She tells me that she does tend to lay on her right side and this seems to be aggravating the issue. She does not have a Prevalon boot for use at this facility. Fortunately she is not having significant pain. 01/06/17 on evaluation today patient's right lateral ankle wound appears to be doing fairly well. There was some Slough covering although there is no evidence of infection and she does not seem to have significant pain. She is pleased with how this is progressing compared to where it was in the beginning but wishes it would completely close. 01/20/17 on evaluation today patient's wound appears to be doing better. She did have [name] [place], [place]. ([id number]) the wound but overall there did not appear to be a significant amount of erythema and definitely no read streaking from the wound itself. She also did not have significant pain. 01/27/17 on evaluation today patient appears to be doing okay although she unfortunately had a recent fall two days ago and during this fall she injured her right knee as well as her left lateral leg where there is a blister there. There is an abrasion over the knee. Unfortunately the knee has been giving her some discomfort and pain. Her right lateral ankle wound does appear to be doing a little bit better in my opinion in regard to the wound bed where I no longer visualize faster today but rather she does have pink granulation tissue. There  is no evidence of infection. 02/13/2017 -- about 3 weeks ago she had had a fall and injured the medial part of her right foot which is now a full-fledged ulcerated area with slough. We have established this as a new wound today. 02/20/17 on evaluation today patient's wounds appeared to be doing about the same although the medial actually is slough covered. Fortunately or infection. She has no knowledge of, vomiting, diarrhea to be getting more significant issue such as sepsis and does not appear to have any pain. With that being said she does not appear to be as alert and oriented action has been in the past. This is obviously a mental shift compared to when I had previously seen her. Unfortunately her husband  over the past week and I think this may be weighing heavy on her as well. ===== Old notes: 61 year old patient who is known to be a diabetic and has end-stage renal disease has had several comorbidities including coronary artery disease, hypertension, hyperlipidemia, pancreatitis, anemia, previous history of hysterectomy, cholecystectomy, left-sided salivary gland excision, bilateral cataract surgery,Peritoneal dialysis catheter, hemodialysis catheter. the area on the back has also been caused by instant pressure she used to sleep on a recliner all day and has significant kyphoscoliosis. As far as the wound on her right lower extremity she's not sure how this blister occurred but she thought it has been there for about 2 weeks. No recent blood investigations available and no recent hemoglobin A1c. 12/30/2014 -- she is an assisted living facility but I believe the nurses that have not followed instructions as she had some cream applied on her back and there was a different dressing. Last week she's had a AV fistula placed on her left forearm. 01/13/2015 -- she has had some localized infection at the port site and she's been on doxycycline for this. 02/05/2015 - he has developed a small  blister on her right lower extremity. 02/10/2015 -- she has developed another small blister on her right anterior chest wall in the area where she's had tape for her dialysis access. this may just be injury caused by a tape burn. She had a dermatology opinion and they have taken a biopsy of her skin. She also had a left brachial AV fistula placed this week. ========= [name], [name] ([id number]) Objective Constitutional Obese and well-hydrated in no acute distress. Vitals Time Taken: 8:50 AM, Height: 61 in, Weight: 160 lbs, BMI: 30.2, Temperature: 97.7 F, Pulse: 73 bpm, Respiratory Rate: 18 breaths/min, Blood Pressure: 147/60 mmHg. Respiratory normal breathing without difficulty. clear to auscultation bilaterally. Cardiovascular regular rate and rhythm with normal S1, S2. Psychiatric this patient is able to make decisions and demonstrates good insight into disease process. Patient is oriented to person only. pleasant and cooperative. General Notes: Patient right medial foot wound appears slough covered and did required debridement today which he tolerated without any complication. The lateral malleolus wound also have slough covering the and required debridement and there was no infection apparent. Integumentary (Hair, Skin) Wound #10 status is Open. Original cause of wound was Trauma. The wound is located on the Right,Lateral Malleolus. The wound measures 0.4cm length x 0.3cm width x 0.2cm depth; 0.094cm^2 area and 0.019cm^3 volume. There is Fat Layer (Subcutaneous Tissue) Exposed exposed. There is no tunneling noted. There is a large amount of serous drainage noted. The wound margin is flat and intact. There is no granulation within the wound bed. There is a large (67-100%) amount of necrotic tissue within the wound bed including Adherent Slough. The periwound skin appearance exhibited: Erythema. The periwound skin appearance did not exhibit: Callus, Crepitus, Excoriation, Induration,  Rash, Scarring, Dry/Scaly, Maceration, Atrophie Blanche, Cyanosis, Ecchymosis, Hemosiderin Staining, Mottled, Pallor, Rubor. The surrounding wound skin color is noted with erythema which is circumferential. Periwound temperature was noted as No Abnormality. The periwound has tenderness on palpation. Wound #13 status is Open. Original cause of wound was Gradually Appeared. The wound is located on the Right,Medial Foot. The wound measures 0.5cm length x 1.5cm width x 0.2cm depth; 0.589cm^2 area and 0.118cm^3 volume. There is no tunneling or undermining noted. There is a large amount of serous drainage noted. The wound margin is distinct with the outline attached to the wound base. There is no granulation  within the wound bed. There is a large (67-100%) amount of necrotic tissue within the wound bed including Adherent Slough. The periwound skin appearance exhibited: Maceration, Erythema. The surrounding wound skin color is noted with erythema which is circumferential. Periwound temperature was noted as No Abnormality. The periwound has tenderness on palpation. 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 There was a Skin/Subcutaneous Tissue Debridement ([id number]) debridement with total area of 0.12 sq cm performed by STONE III, Delsin Copen E., PA-C. with the following instrument(s): Curette to remove Viable and Non-Viable tissue/material including Exudate, Fibrin/Slough, and Subcutaneous after achieving pain control using Lidocaine 4%  Topical Solution. A time out was conducted at 09:27, prior to the start of the procedure. A Minimum amount of bleeding was controlled with Pressure. The procedure was tolerated well with a pain level of 0 throughout and a pain level of 0 following the procedure. Post Debridement Measurements: 0.4cm length x 0.3cm width x 0.3cm depth; 0.028cm^3 volume. Character of Wound/Ulcer Post Debridement requires further debridement. Post procedure Diagnosis Wound #10: Same as Pre-Procedure Wound #13 Pre-procedure diagnosis of Wound #13 is a Diabetic Wound/Ulcer of the Lower Extremity located on the Right,Medial Foot .Severity of Tissue Pre Debridement is: Fat layer exposed. There was a Skin/Subcutaneous Tissue Debridement ([id number]) debridement with total area of 0.75 sq cm performed by STONE III, Ryliee Figge E., PA-C. with the following instrument(s): Curette to remove Viable and Non-Viable tissue/material including Exudate, Fibrin/Slough, and Subcutaneous after achieving pain control using Lidocaine 4% Topical Solution. A time out was conducted at 09:27, prior to the start of the procedure. A Minimum amount of bleeding was controlled with Pressure. The procedure was tolerated well with a pain level of 0 throughout and a pain level of 0 following the procedure. Post Debridement Measurements: 0.5cm length x 1.5cm width x 0.3cm depth; 0.177cm^3 volume. Character of Wound/Ulcer Post Debridement requires further debridement. Severity of Tissue Post Debridement is: Fat layer exposed. Post procedure Diagnosis Wound #13: Same as Pre-Procedure [name], [name] ([id number]) Plan Wound Cleansing: Wound #10 Right,Lateral Malleolus: Clean wound with Normal Saline. Cleanse wound with mild soap and water May Shower, gently pat wound dry prior to applying new dressing. Wound #13 Right,Medial Foot: Clean wound with Normal Saline. Cleanse wound with mild soap and water May Shower, gently pat wound dry prior to applying  new dressing. Anesthetic: Wound #10 Right,Lateral Malleolus: Topical Lidocaine 4% cream applied to wound bed prior to debridement - for clinic use Wound #13 Right,Medial Foot: Topical Lidocaine 4% cream applied to wound bed prior to debridement - for clinic use Skin Barriers/Peri-Wound Care: Wound #10 Right,Lateral Malleolus: Skin Prep Wound #13 Right,Medial Foot: Skin Prep Primary Wound Dressing: Wound #10 Right,Lateral Malleolus: Prisma Ag - (collagen) moisten with saline Wound #13 Right,Medial Foot: Prisma Ag - (collagen) moisten with saline Secondary Dressing: Wound #10 Right,Lateral Malleolus: Dry Gauze Boardered Foam Dressing Wound #13 Right,Medial Foot: Dry Gauze Boardered Foam Dressing Dressing Change Frequency: Wound #10 Right,Lateral Malleolus: Change dressing every day. Wound #13 Right,Medial Foot: Change dressing every day. Follow-up Appointments: Wound #10 Right,Lateral Malleolus: Return Appointment  in 1 week. Wound #13 Right,Medial Foot: Return Appointment in 1 week. Edema Control: Wound #10 Right,Lateral Malleolus: Patient to wear own compression stockings Elevate legs to the level of the heart and pump ankles as often as possible Wound #13 Right,Medial Foot: Patient to wear own compression stockings [name], [name] ([id number]) Elevate legs to the level of the heart and pump ankles as often as possible We are going to continue with the Current wound care measures for the next week. We will see her for reevaluation at that point. If anything worsens in the interim she will contact our office for additional recommendations. Otherwise please see above for specific wound care orders. Electronic Signature(s) Signed: 02/20/2017 4:13:08 PM By: Worthy Keeler PA-C Entered By: Worthy Keeler on 02/20/2017 10:28:13 Zatarain, [name] ([id number]) -------------------------------------------------------------------------------- SuperBill Details [name]. Date of Service: 02/20/2017 Medical Record Number: [id number] Patient Account Number: [id number] Date of Birth/Sex: [birth year] (61 y.o. 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 S80.211A Abrasion, right knee, initial encounter N18.6 End stage renal disease Z99.2 Dependence on renal dialysis L97.512 Non-pressure chronic ulcer of other part of right foot with fat layer exposed Facility Procedures CPT4 Code Description: [id number] 11042 - DEB SUBQ TISSUE 20 SQ CM/< ICD-10 Description Diagnosis L97.312 Non-pressure chronic ulcer of right ankle with fat l L97.512 Non-pressure chronic ulcer of other part of right fo Modifier: ayer exposed ot with fat la Quantity: 1 yer exposed Physician Procedures CPT4 Code Description: [id number] 11042 - WC PHYS SUBQ TISS 20 SQ CM ICD-10 Description Diagnosis L97.312 Non-pressure chronic ulcer of right ankle with fat l L97.512 Non-pressure chronic ulcer of other part of right fo Modifier: ayer exposed ot with fat lay Quantity: 1 er exposed Electronic Signature(s) Signed: 02/20/2017 4:13:08 PM By: Worthy Keeler PA-C Entered By: Worthy Keeler on 02/20/2017 10:28:32

## 2017-02-21 NOTE — Progress Notes (Signed)
YAMELI, DELAMATER ([id number]) Visit Report for 02/20/2017 Arrival Information Details [name], [name]. Date of Service: 02/20/2017 8:45 AM Medical Record Number: [id number] Patient Account Number: [id number] Date of Birth/Sex: 09/03/1955 (61 y.o. Female) Treating RN: Carolyne Fiscal, Debi Primary Care Asahel Risden: [name] Other Clinician: Referring Ottavio Norem: [name] Treating Kejon Feild/Extender: Melburn Hake, HOYT [name]: 57 Visit Information History Since Last Visit All ordered tests and consults were completed: No Patient Arrived: Wheel Chair Added or deleted any medications: No Arrival Time: 08:47 Any new allergies or adverse reactions: No Accompanied By: self Had a fall or experienced change in No Transfer Assistance: EasyPivot Patient activities of daily living that may affect Lift risk of falls: Patient Identification Verified: Yes Signs or symptoms of abuse/neglect since last No Secondary Verification Process Yes visito Completed: Hospitalized since last visit: No Patient Requires Transmission- No Has Dressing in Place as Prescribed: Yes Based Precautions: Pain Present Now: No Patient Has Alerts: Yes Patient Alerts: Patient on Blood Thinner Electronic Signature(s) Signed: 02/20/2017 4:43:25 PM By: Alric Quan Entered By: Alric Quan on 02/20/2017 08:50:11 Sustaita, [name] ([id number]) -------------------------------------------------------------------------------- Encounter Discharge Information Details Patient Name: [name], [name] Date of Service: 02/20/2017 8:45 AM Medical Record Number: [id number] Patient Account Number: [id number] Date of Birth/Sex: 1956-07-02 (61 y.o. Female) Treating RN: Carolyne Fiscal, Debi Primary Care Waverley Krempasky: [name] Other Clinician: Referring Macil Crady: [name] Treating Jaryan Chicoine/Extender: Melburn Hake, HOYT [name]: 89 Encounter Discharge Information Items Discharge Pain Level: 0 Discharge  Condition: Stable Ambulatory Status: Wheelchair Discharge Destination: Nursing Home Transportation: Other Accompanied By: self Schedule Follow-up Appointment: Yes Medication Reconciliation completed No and provided to Patient/Care Towana Stenglein: Provided on Clinical Summary of Care: 02/20/2017 Form Type Recipient Paper Patient LD Electronic Signature(s) Signed: 02/20/2017 9:44:38 AM By: Ruthine Dose Entered By: Ruthine Dose on 02/20/2017 09:44:38 Reininger, [name] ([id number]) -------------------------------------------------------------------------------- Lower Extremity Assessment Details Patient Name: [name], [name] Date of Service: 02/20/2017 8:45 AM Medical Record Number: [id number] Patient Account Number: [id number] Date of Birth/Sex: 1956/07/05 (61 y.o. Female) Treating RN: Carolyne Fiscal, Debi Primary Care Jordyan Hardiman: [name] Other Clinician: Referring Isabel Ardila: [name] Treating Raziel Koenigs/Extender: Melburn Hake, HOYT [name]: 19 Vascular Assessment Pulses: Dorsalis Pedis Palpable: [Right:Yes] Posterior Tibial Extremity colors, hair growth, and conditions: Extremity Color: [Right:Normal] Temperature of Extremity: [Right:Warm] Capillary Refill: [Right:< 3 seconds] Electronic Signature(s) Signed: 02/20/2017 4:43:25 PM By: Alric Quan Entered By: Alric Quan on 02/20/2017 09:03:11 Free, Phynix S. ([id number]) -------------------------------------------------------------------------------- Multi Wound Chart Details Patient Name: [name], [name] Date of Service: 02/20/2017 8:45 AM Medical Record Number: [id number] Patient Account Number: [id number] Date of Birth/Sex: 03/13/1956 (61 y.o. Female) Treating RN: Carolyne Fiscal, Debi Primary Care Garin Mata: [name] Other Clinician: Referring Christy Ehrsam: [name] Treating Markelle Asaro/Extender: STONE III, HOYT [name]: 19 Vital Signs Height(in): 61 Pulse(bpm): 73 Weight(lbs): 160 Blood  Pressure 147/60 (mmHg): Body Mass Index(BMI): 30 Temperature(F): 97.7 Respiratory Rate 18 (breaths/min): Photos: [10:No Photos] [13:No Photos] [N/A:N/A] Wound Location: [10:Right Malleolus - LateralRight Foot - Medial] [N/A:N/A] Wounding Event: [10:Trauma] [13:Gradually Appeared] [N/A:N/A] Primary Etiology: [10:Trauma, Other] [13:Diabetic Wound/Ulcer of N/A the Lower Extremity] Comorbid History: [10:Glaucoma, Anemia, Coronary Artery Disease, Coronary Artery Disease, Hypertension, Type II Diabetes, End Stage Renal Disease, Osteoarthritis, Neuropathy Osteoarthritis, Neuropathy] [13:Glaucoma, Anemia, Hypertension, Type II  Diabetes, End Stage Renal Disease,] [N/A:N/A] Date Acquired: [10:09/12/2016] [13:12/14/2016] [N/A:N/A] Weeks of Treatment: [10:19] [13:1] [N/A:N/A] Wound Status: [10:Open] [13:Open] [N/A:N/A] Measurements L x W x D 0.4x0.3x0.2 [13:0.5x1.5x0.2] [N/A:N/A] (cm) Area (cm) : [10:0.094] [13:0.589] [N/A:N/A] Volume (cm) : [10:0.019] [13:0.118] [  N/A:N/A] % Reduction in Area: [10:66.80%] [13:16.70%] [N/A:N/A] % Reduction in Volume: 66.70% [13:16.30%] [N/A:N/A] Classification: [10:Full Thickness Without Exposed Support Structures] [13:Grade 1] [N/A:N/A] HBO Classification: [10:Grade 1] [13:N/A] [N/A:N/A] Exudate Amount: [10:Large] [13:Large] [N/A:N/A] Exudate Type: [10:Serous] [13:Serous] [N/A:N/A] Exudate Color: [10:amber] [13:amber] [N/A:N/A] Wound Margin: [10:Flat and Intact] [13:Distinct, outline attached N/A] Granulation Amount: [10:None Present (0%)] [13:None Present (0%)] [N/A:N/A] Necrotic Amount: [10:Large (67-100%)] [13:Large (67-100%)] [N/A:N/A] Exposed Structures: Fat Layer (Subcutaneous N/A N/A Tissue) Exposed: Yes Fascia: No Tendon: No Muscle: No Joint: No Bone: No Epithelialization: None None N/A Periwound Skin Texture: Excoriation: No No Abnormalities Noted N/A Induration: No Callus: No Crepitus: No Rash: No Scarring: No Periwound Skin  Maceration: No Maceration: Yes N/A Moisture: Dry/Scaly: No Periwound Skin Color: Erythema: Yes Erythema: Yes N/A Atrophie Blanche: No Cyanosis: No Ecchymosis: No Hemosiderin Staining: No Mottled: No Pallor: No Rubor: No Erythema Location: Circumferential Circumferential N/A Temperature: No Abnormality No Abnormality N/A Tenderness on Yes Yes N/A Palpation: Wound Preparation: Ulcer Cleansing: Ulcer Cleansing: N/A Rinsed/Irrigated with Rinsed/Irrigated with Saline Saline Topical Anesthetic Topical Anesthetic Applied: Other: lidocaine Applied: Other: lidocaine 4% 4% Treatment Notes Electronic Signature(s) Signed: 02/20/2017 4:43:25 PM By: Alric Quan Entered By: Alric Quan on 02/20/2017 09:03:31 Rostro, [name] ([id number]) -------------------------------------------------------------------------------- [place] Details Patient Name: [name] Date of Service: 02/20/2017 8:45 AM Medical Record Number: [id number] Patient Account Number: [id number] Date of Birth/Sex: 1955-08-06 (61 y.o. Female) Treating RN: Carolyne Fiscal, Debi Primary Care Natayla Cadenhead: [name] Other Clinician: Referring [place]: [name] Treating Sedrick Tober/Extender: Melburn Hake, HOYT [name]: 19 Active Inactive ` Abuse / Safety / Falls / Self Care Management Nursing Diagnoses: Potential for falls Goals: Patient will remain injury free Date Initiated: 10/10/2016 Target Resolution Date: 12/23/2016 Goal Status: Active Interventions: Assess fall risk on admission and as needed Notes: ` Nutrition Nursing Diagnoses: Potential for alteratiion in Nutrition/Potential for imbalanced nutrition Goals: Patient/caregiver agrees to and verbalizes understanding of need to use nutritional supplements and/or vitamins as prescribed Date Initiated: 10/10/2016 Target Resolution Date: 10/21/2016 Goal Status: Active Interventions: Assess patient nutrition upon admission and as  needed per policy Notes: ` Orientation to the Wound Care Program Nursing Diagnoses: Knowledge deficit related to the wound healing center program Dieguez, KASSIAH MCCRORY ([id number]) Goals: Patient/caregiver will verbalize understanding of the [place] Date Initiated: 10/10/2016 Target Resolution Date: 12/23/2016 Goal Status: Active Interventions: Provide education on orientation to the wound center Notes: ` Wound/Skin Impairment Nursing Diagnoses: Impaired tissue integrity Goals: Patient/caregiver will verbalize understanding of skin care regimen Date Initiated: 10/10/2016 Target Resolution Date: 12/23/2016 Goal Status: Active Ulcer/skin breakdown will have a volume reduction of 30% by week 4 Date Initiated: 10/10/2016 Target Resolution Date: 12/23/2016 Goal Status: Active Ulcer/skin breakdown will have a volume reduction of 50% by week 8 Date Initiated: 10/10/2016 Target Resolution Date: 12/23/2016 Goal Status: Active Ulcer/skin breakdown will have a volume reduction of 80% by week 12 Date Initiated: 10/10/2016 Target Resolution Date: 12/23/2016 Goal Status: Active Ulcer/skin breakdown will heal within 14 weeks Date Initiated: 10/10/2016 Target Resolution Date: 12/23/2016 Goal Status: Active Interventions: Assess patient/caregiver ability to obtain necessary supplies Assess patient/caregiver ability to perform ulcer/skin care regimen upon admission and as needed Assess ulceration(s) every visit Notes: Electronic Signature(s) Signed: 02/20/2017 4:43:25 PM By: Alric Quan Entered By: Alric Quan on 02/20/2017 09:03:18 Parson, [name] ([id number]) -------------------------------------------------------------------------------- Pain Assessment Details Patient Name: [name] Date of Service: 02/20/2017 8:45 AM Medical Record Number: [id number] Patient Account Number: [id number] Date of Birth/Sex: Jan 18, 1956 (61  y.o. Female) Treating RN: Carolyne Fiscal, Debi Primary  Care Julissa Browning: [name] Other Clinician: Referring Lakyn Mantione: [name] Treating Teryn Gust/Extender: Melburn Hake, HOYT [name]: 19 Active Problems Location of Pain Severity and Description of Pain Patient Has Paino No Site Locations With Dressing Change: No Pain Management and Medication Current Pain Management: Electronic Signature(s) Signed: 02/20/2017 4:43:25 PM By: Alric Quan Entered By: Alric Quan on 02/20/2017 08:50:23 Biederman, [name] ([id number]) -------------------------------------------------------------------------------- Patient/Caregiver Education Details Patient Name: [name] Date of Service: 02/20/2017 8:45 AM Medical Record Number: [id number] Patient Account Number: [id number] Date of Birth/Gender: September 05, 1955 (61 y.o. Female) Treating RN: Carolyne Fiscal, Debi Primary Care Physician: [name] Other Clinician: Referring Physician: [name] Treating Physician/Extender: Sharalyn Ink in Treatment: 75 Education Assessment Education Provided To: Patient Education Topics Provided Wound/Skin Impairment: Handouts: Other: change dressing as ordered Methods: Demonstration, Explain/Verbal Responses: State content correctly Electronic Signature(s) Signed: 02/20/2017 4:43:25 PM By: Alric Quan Entered By: Alric Quan on 02/20/2017 09:04:04 Maske, Tawnya S. ([id number]) -------------------------------------------------------------------------------- Wound Assessment Details Patient Name: Eilts, Ali Molina S. Date of Service: 02/20/2017 8:45 AM Medical Record Number: [id number] Patient Account Number: [id number] Date of Birth/Sex: 06-10-1956 (61 y.o. Female) Treating RN: Carolyne Fiscal, Debi Primary Care Kelsha Older: [name] Other Clinician: Referring Glennie Bose: [name] Treating Tyreque Finken/Extender: STONE III, HOYT [name]: 19 Wound Status Wound Number: 10 Primary Trauma, Other Etiology: Wound Location: Right  Malleolus - Lateral Wound Open Wounding Event: Trauma Status: Date Acquired: 09/12/2016 Comorbid Glaucoma, Anemia, Coronary Artery Weeks Of Treatment: 19 History: Disease, Hypertension, Type II Clustered Wound: No Diabetes, End Stage Renal Disease, Osteoarthritis, Neuropathy Photos Photo Uploaded By: Alric Quan on 02/20/2017 11:29:04 Wound Measurements Length: (cm) 0.4 Width: (cm) 0.3 Depth: (cm) 0.2 Area: (cm) 0.094 Volume: (cm) 0.019 % Reduction in Area: 66.8% % Reduction in Volume: 66.7% Epithelialization: None Tunneling: No Wound Description Full Thickness Without Classification: Exposed Support Structures Diabetic Severity Grade 1 (Wagner): Wound Margin: Flat and Intact Exudate Amount: Large Exudate Type: Serous Exudate Color: amber Foul Odor After Cleansing: No Slough/Fibrino Yes Wound Bed Apodaca, Cayci S. ([id number]) Granulation Amount: None Present (0%) Exposed Structure Necrotic Amount: Large (67-100%) Fascia Exposed: No Necrotic Quality: Adherent Slough Fat Layer (Subcutaneous Tissue) Exposed: Yes Tendon Exposed: No Muscle Exposed: No Joint Exposed: No Bone Exposed: No Periwound Skin Texture Texture Color No Abnormalities Noted: No No Abnormalities Noted: No Callus: No Atrophie Blanche: No Crepitus: No Cyanosis: No Excoriation: No Ecchymosis: No Induration: No Erythema: Yes Rash: No Erythema Location: Circumferential Scarring: No Hemosiderin Staining: No Mottled: No Moisture Pallor: No No Abnormalities Noted: No Rubor: No Dry / Scaly: No Maceration: No Temperature / Pain Temperature: No Abnormality Tenderness on Palpation: Yes Wound Preparation Ulcer Cleansing: Rinsed/Irrigated with Saline Topical Anesthetic Applied: Other: lidocaine 4%, Treatment Notes Wound #10 (Right, Lateral Malleolus) 1. Cleansed with: Clean wound with Normal Saline 2. Anesthetic Topical Lidocaine 4% cream to wound bed prior to debridement 3.  Peri-wound Care: Skin Prep 4. Dressing Applied: Prisma Ag 5. Secondary Dressing Applied Bordered Foam Dressing Dry Gauze Electronic Signature(s) Signed: 02/20/2017 4:43:25 PM By: Alric Quan Entered By: Alric Quan on 02/20/2017 08:59:51 Mondo, Rilda S. ([id number]) Heron, [name] ([id number]) -------------------------------------------------------------------------------- Wound Assessment Details Patient Name: Swindell, Lissa S. Date of Service: 02/20/2017 8:45 AM Medical Record Number: [id number] Patient Account Number: [id number] Date of Birth/Sex: 03-20-1956 (61 y.o. Female) Treating RN: Carolyne Fiscal, Debi Primary Care Nyala Kirchner: [name] Other Clinician: Referring Nahsir Venezia: [name] Treating Oziel Beitler/Extender: STONE III, HOYT [name]: 19 Wound Status Wound  Number: 13 Primary Diabetic Wound/Ulcer of the Lower Etiology: Extremity Wound Location: Right Foot - Medial Wound Open Wounding Event: Gradually Appeared Status: Date Acquired: 12/14/2016 Comorbid Glaucoma, Anemia, Coronary Artery Weeks Of Treatment: 1 History: Disease, Hypertension, Type II Clustered Wound: No Diabetes, End Stage Renal Disease, Osteoarthritis, Neuropathy Photos Photo Uploaded By: Alric Quan on 02/20/2017 11:29:05 Wound Measurements Length: (cm) 0.5 Width: (cm) 1.5 Depth: (cm) 0.2 Area: (cm) 0.589 Volume: (cm) 0.118 % Reduction in Area: 16.7% % Reduction in Volume: 16.3% Epithelialization: None Tunneling: No Undermining: No Wound Description Classification: Grade 1 Foul Odor Aft Wound Margin: Distinct, outline attached Slough/Fibrin Exudate Amount: Large Exudate Type: Serous Exudate Color: amber er Cleansing: No o Yes Wound Bed Granulation Amount: None Present (0%) Necrotic Amount: Large (67-100%) Necrotic Quality: Adherent Slough Grover, Ketrina S. ([id number]) Periwound Skin Texture Texture Color No Abnormalities Noted: No No Abnormalities Noted:  No Erythema: Yes Moisture Erythema Location: Circumferential No Abnormalities Noted: No Maceration: Yes Temperature / Pain Temperature: No Abnormality Tenderness on Palpation: Yes Wound Preparation Ulcer Cleansing: Rinsed/Irrigated with Saline Topical Anesthetic Applied: Other: lidocaine 4%, Treatment Notes Wound #13 (Right, Medial Foot) 1. Cleansed with: Clean wound with Normal Saline 2. Anesthetic Topical Lidocaine 4% cream to wound bed prior to debridement 3. Peri-wound Care: Skin Prep 4. Dressing Applied: Prisma Ag 5. Secondary Dressing Applied Bordered Foam Dressing Dry Gauze Electronic Signature(s) Signed: 02/20/2017 4:43:25 PM By: Alric Quan Entered By: Alric Quan on 02/20/2017 09:01:35 Cutshaw, [name] ([id number]) -------------------------------------------------------------------------------- Vitals Details Patient Name: [name] Date of Service: 02/20/2017 8:45 AM Medical Record Number: [id number] Patient Account Number: [id number] Date of Birth/Sex: Dec 19, 1955 (61 y.o. Female) Treating RN: Carolyne Fiscal, Debi Primary Care Tanaya Dunigan: [name] Other Clinician: Referring Laressa Bolinger: [name] Treating Sol Englert/Extender: STONE III, HOYT [name]: 19 Vital Signs Time Taken: 08:50 Temperature (F): 97.7 Height (in): 61 Pulse (bpm): 73 Weight (lbs): 160 Respiratory Rate (breaths/min): 18 Body Mass Index (BMI): 30.2 Blood Pressure (mmHg): 147/60 Reference Range: 80 - 120 mg / dl Electronic Signature(s) Signed: 02/20/2017 4:43:25 PM By: Alric Quan Entered By: Alric Quan on 02/20/2017 08:53:30

## 2017-02-27 ENCOUNTER — Encounter: Payer: Medicare Other | Admitting: Nurse Practitioner

## 2017-02-27 DIAGNOSIS — E11621 Type 2 diabetes mellitus with foot ulcer: Secondary | ICD-10-CM | POA: Diagnosis not present

## 2017-02-27 NOTE — Progress Notes (Addendum)
BRITNE, BORELLI ([id number]) Visit Report for 02/27/2017 Chief Complaint Document Details [name], [name]. Date of Service: 02/27/2017 9:15 AM Medical Record Number: [id number] Patient Account Number: [id number] Date of Birth/Sex: Jul 16, 1956 (61 y.o. Female) Treating RN: Cornell Barman Primary Care Provider: [name] Other Clinician: Referring Provider: [name] Treating Provider/Extender: Cathie Olden in Treatment: 20 Information Obtained from: Patient Chief Complaint She is here in follow up evaluation of right foot ulcers Electronic Signature(s) Signed: 02/27/2017 10:10:18 AM By: Lawanda Cousins Entered By: Lawanda Cousins on 02/27/2017 10:10:18 [name], [name] ([id number]) -------------------------------------------------------------------------------- Debridement Details [name]. Date of Service: 02/27/2017 9:15 AM Medical Record Number: [id number] Patient Account Number: [id number] Date of Birth/Sex: [birth year] (61 y.o. Female) Treating RN: Cornell Barman Primary Care Provider: [name] Other Clinician: Referring Provider: [name] Treating Provider/Extender: Cathie Olden in Treatment: 20 Debridement Performed for Wound #10 Right,Lateral Malleolus Assessment: Performed By: Physician Lawanda Cousins, NP Debridement: Open Wound/Selective Debridement Selective Description: Pre-procedure Verification/Time Out Yes - 09:33 Taken: Start Time: 09:33 Pain Control: Lidocaine 2% Topical Gel Level: Non-Viable Tissue Total Area Debrided (L x 0.3 (cm) x 0.3 (cm) = 0.09 (cm) W): Tissue and other Non-Viable, Exudate, Fibrin/Slough material debrided: Instrument: Blade Bleeding: Minimum End Time: 09:35 Procedural Pain: 0 Post Procedural Pain: 0 Response to Treatment: Procedure was tolerated well Post Debridement Measurements of Total Wound Length: (cm) 0.3 Width: (cm) 0.3 Depth: (cm) 0.2 Volume: (cm) 0.014 Character of  Wound/Ulcer Post Requires Further Debridement Debridement: Post Procedure Diagnosis Same as Pre-procedure Electronic Signature(s) Signed: 02/27/2017 9:51:30 AM By: Lawanda Cousins Signed: 02/28/2017 7:32:57 AM By: Gretta Cool, [name], RN, CWS, Kim RN, [name] Entered By: Lawanda Cousins on 02/27/2017 09:51:30 Toft, [name] ([id number]) [name], [name] ([id number]) -------------------------------------------------------------------------------- Debridement Details [name], [name]. Date of Service: 02/27/2017 9:15 AM Medical Record Number: [id number] Patient Account Number: [id number] Date of Birth/Sex: [birth year] (61 y.o. Female) Treating RN: Cornell Barman Primary Care Provider: [name] Other Clinician: Referring Provider: [name] Treating Provider/Extender: Cathie Olden in Treatment: 20 Debridement Performed for Wound #13 Right,Medial Foot Assessment: Performed By: Physician Lawanda Cousins, NP Debridement: Open Wound/Selective Severity of Tissue Pre Fat layer exposed Debridement: Debridement Selective Description: Pre-procedure Verification/Time Out Yes - 09:33 Taken: Start Time: 09:33 Pain Control: Lidocaine 2% Topical Gel Level: Non-Viable Tissue Total Area Debrided (L x 0.5 (cm) x 0.9 (cm) = 0.45 (cm) W): Tissue and other Non-Viable, Exudate, Fibrin/Slough material debrided: Instrument: Blade Bleeding: Minimum End Time: 09:35 Procedural Pain: 0 Post Procedural Pain: 0 Response to Treatment: Procedure was tolerated well Post Debridement Measurements of Total Wound Length: (cm) 0.5 Width: (cm) 0.9 Depth: (cm) 0.2 Volume: (cm) 0.071 Character of Wound/Ulcer Post Requires Further Debridement Debridement: Severity of Tissue Post Debridement: Fat layer exposed Post Procedure Diagnosis Same as Pre-procedure Electronic Signature(s) Signed: 02/27/2017 9:53:11 AM By: Woodfin Ganja ([id number]) Signed: 02/28/2017 7:32:57 AM By: Gretta Cool,  [name], RN, CWS, Kim RN, [name] Entered By: Lawanda Cousins on 02/27/2017 09:53:11 Duhamel, [name] ([id number]) -------------------------------------------------------------------------------- HPI Details [name], [name]. Date of Service: 02/27/2017 9:15 AM Medical Record Number: [id number] Patient Account Number: [id number] Date of Birth/Sex: 1956/04/23 (61 y.o. Female) Treating RN: Cornell Barman Primary Care Provider: [name] Other Clinician: Referring Provider: [name] Treating Provider/Extender: Cathie Olden in Treatment: 20 History of Present Illness Location: right lateral ankle Quality: Patient reports No Pain. Severity: Patient states wound (s) are getting better. Duration: Patient has had the wound for >  1 months prior to seeking treatment at the wound center Context: The wound appeared gradually over time, and she thinks she might have hit it with a walker Modifying Factors: Patient is currently on renal dialysis and receives treatments 3 times weekly HPI Description: 61 year old patient known to our practice from several previous visits for various problems now comes back with an ulcerated area on her right lateral malleolus which she's had for about a month and she thinks she may have hit it with her walker. No other problems and no x-ray of this has been taken. She has a past medical history of atrial fibrillation, carotid artery disease, CHF, chronic kidney disease on hemodialysis with a functioning AV fistula seen by Dr. Corene Cornea dew, diabetes mellitus with peripheral neuropathy, pancreatitis and is status post abdominal hysterectomy, AV fistula placement in June 2016, back surgery, cholecystectomy, coronary angioplasty,several surgeries for AV fistula, and dialysis catheters. She has never been a smoker. 10/17/2016 -- had a right ankle x-ray which showed no acute pathology of the right ankle. Arterial vascular calcifications are noted and there is  generalized osteopenia and demineralization 11/11/16 Patient appears to be doing fairly well with regard to her right ankle wound. She has been tolerating the dressing changes with the Santyl that does seem to have loosened up the slough a bit. She has also been tolerating the debridement's when they have been performed. She tells me that she does tend to lay on her right side and this seems to be aggravating the issue. She does not have a Prevalon boot for use at this facility. Fortunately she is not having significant pain. 01/06/17 on evaluation today patient's right lateral ankle wound appears to be doing fairly well. There was some Slough covering although there is no evidence of infection and she does not seem to have significant pain. She is pleased with how this is progressing compared to where it was in the beginning but wishes it would completely close. 01/20/17 on evaluation today patient's wound appears to be doing better. She did have some Slough covering the wound but overall there did not appear to be a significant amount of erythema and definitely no read streaking from the wound itself. She also did not have significant pain. 01/27/17 on evaluation today patient appears to be doing okay although she unfortunately had a recent fall two days ago and during this fall she injured her right knee as well as her left lateral leg where there is a blister there. There is an abrasion over the knee. Unfortunately the knee has been giving her some discomfort and pain. Her right lateral ankle wound does appear to be doing a little bit better in my opinion in regard to the wound bed where I no longer visualize faster today but rather she does have pink granulation Coalson, Ziyonna S. ([id number]) tissue. There is no evidence of infection. 02/13/2017 -- about 3 weeks ago she had had a fall and injured the medial part of her right foot which is now a full-fledged ulcerated area with slough. We have  established this as a new wound today. 02/20/17 on evaluation today patient's wounds appeared to be doing about the same although the medial actually is slough covered. Fortunately or infection. She has no knowledge of, vomiting, diarrhea to be getting more significant issue such as sepsis and does not appear to have any pain. With that being said she does not appear to be as alert and oriented action has been in the past. This  is obviously a mental shift compared to when I had previously seen her. Unfortunately her husband over the past week and I think this may be weighing heavy on her as well. ===== Old notes: 61 year old patient who is known to be a diabetic and has end-stage renal disease has had several comorbidities including coronary artery disease, hypertension, hyperlipidemia, pancreatitis, anemia, previous history of hysterectomy, cholecystectomy, left-sided salivary gland excision, bilateral cataract surgery,Peritoneal dialysis catheter, hemodialysis catheter. the area on the back has also been caused by instant pressure she used to sleep on a recliner all day and has significant kyphoscoliosis. As far as the wound on her right lower extremity she's not sure how this blister occurred but she thought it has been there for about 2 weeks. No recent blood investigations available and no recent hemoglobin A1c. 12/30/2014 -- she is an assisted living facility but I believe the nurses that have not followed instructions as she had some cream applied on her back and there was a different dressing. Last week she's had a AV fistula placed on her left forearm. 01/13/2015 -- she has had some localized infection at the port site and she's been on doxycycline for this. 02/05/2015 - he has developed a small blister on her right lower extremity. 02/10/2015 -- she has developed another small blister on her right anterior chest wall in the area where she's had tape for her dialysis access. this may  just be injury caused by a tape burn. She had a dermatology opinion and they have taken a biopsy of her skin. She also had a left brachial AV fistula placed this week. ========= 02/27/17- She is here in follow up; states her blood sugars have been low recently, in the 60s; she denies any fever chills or complications with her wounds. She states she is tolerating dressing changes. There is improvement in measurements. Electronic Signature(s) Signed: 02/27/2017 10:11:50 AM By: Lawanda Cousins Entered By: Lawanda Cousins on 02/27/2017 10:11:50 [name], [name] ([id number]) -------------------------------------------------------------------------------- Physical Exam Details [name], [name] S. Date of Service: 02/27/2017 9:15 AM Medical Record Number: [id number] Patient Account Number: [id number] Date of Birth/Sex: September 03, 1955 (61 y.o. Female) Treating RN: Cornell Barman Primary Care Provider: [name] Other Clinician: Referring Provider: [name] Treating Provider/Extender: Cathie Olden in Treatment: 59 Constitutional hypertensive. afebrile. appears chronically ill. Respiratory O2 in use. Cardiovascular RLE palpable DP and PT. Electronic Signature(s) Signed: 02/27/2017 5:23:40 PM By: Lawanda Cousins Entered By: Lawanda Cousins on 02/27/2017 10:20:44 Valenta, [name] ([id number]) -------------------------------------------------------------------------------- Physician Orders Details [name]. Date of Service: 02/27/2017 9:15 AM Medical Record Number: [id number] Patient Account Number: [id number] Date of Birth/Sex: [birth year] (61 y.o. Female) Treating RN: Cornell Barman Primary Care Provider: [name] Other Clinician: Referring Provider: [name] Treating Provider/Extender: Cathie Olden in Treatment: 20 Verbal / Phone Orders: No Diagnosis Coding Wound Cleansing Wound #10 Right,Lateral Malleolus o Clean wound with Normal Saline. o  Cleanse wound with mild soap and water o May Shower, gently pat wound dry prior to applying new dressing. Wound #13 Right,Medial Foot o Clean wound with Normal Saline. o Cleanse wound with mild soap and water o May Shower, gently pat wound dry prior to applying new dressing. Anesthetic Wound #10 Right,Lateral Malleolus o Topical Lidocaine 4% cream applied to wound bed prior to debridement - for clinic use Wound #13 Right,Medial Foot o Topical Lidocaine 4% cream applied to wound bed prior to debridement - for clinic use Skin Barriers/Peri-Wound Care Wound #10 Right,Lateral Malleolus o  Skin Prep Wound #13 Right,Medial Foot o Skin Prep Primary Wound Dressing Wound #10 Right,Lateral Malleolus o Prisma Ag - (collagen) moisten with saline Wound #13 Right,Medial Foot o Prisma Ag - (collagen) moisten with saline Secondary Dressing Wound #10 Right,Lateral Malleolus o Boardered Foam Dressing Runyan, Myrtle S. ([id number]) Wound #13 Right,Medial Foot o Boardered Foam Dressing Dressing Change Frequency Wound #10 Right,Lateral Malleolus o Change dressing every day. Wound #13 Right,Medial Foot o Change dressing every day. Follow-up Appointments Wound #10 Right,Lateral Malleolus o Return Appointment in 1 week. Wound #13 Right,Medial Foot o Return Appointment in 1 week. Edema Control Wound #10 Right,Lateral Malleolus o Patient to wear own compression stockings o Elevate legs to the level of the heart and pump ankles as often as possible Wound #13 Right,Medial Foot o Patient to wear own compression stockings o Elevate legs to the level of the heart and pump ankles as often as possible Electronic Signature(s) Signed: 02/27/2017 5:23:40 PM By: Lawanda Cousins Signed: 02/28/2017 7:32:57 AM By: Gretta Cool, [name], RN, CWS, Kim RN, [name] Entered By: Gretta Cool, [name], RN, CWS, Kim on 02/27/2017 09:52:22 [place]oli, [name]  ([id number]) -------------------------------------------------------------------------------- Problem List Details [name], [name]. Date of Service: 02/27/2017 9:15 AM Medical Record Number: [id number] Patient Account Number: [id number] Date of Birth/Sex: 08/10/1955 (61 y.o. Female) Treating RN: Cornell Barman Primary Care Provider: [name] Other Clinician: Referring Provider: [name] Treating Provider/Extender: Cathie Olden in Treatment: 20 Active Problems ICD-10 Encounter Code Description Active Date Diagnosis E11.621 Type 2 diabetes mellitus with foot ulcer 10/10/2016 Yes L97.312 Non-pressure chronic ulcer of right ankle with fat layer 10/10/2016 Yes exposed S80.211A Abrasion, right knee, initial encounter 01/27/2017 Yes N18.6 End stage renal disease 10/10/2016 Yes Z99.2 Dependence on renal dialysis 10/10/2016 Yes L97.512 Non-pressure chronic ulcer of other part of right foot with 02/13/2017 Yes fat layer exposed Inactive Problems Resolved Problems Electronic Signature(s) Signed: 02/27/2017 9:49:53 AM By: Lawanda Cousins Entered By: Lawanda Cousins on 02/27/2017 09:49:53 [place], [name] ([id number]) -------------------------------------------------------------------------------- Progress Note Details [name]. Date of Service: 02/27/2017 9:15 AM Medical Record Number: [id number] Patient Account Number: [id number] Date of Birth/Sex: [birth year] (61 y.o. Female) Treating RN: Cornell Barman Primary Care Provider: [name] Other Clinician: Referring Provider: [name] Treating Provider/Extender: Cathie Olden in Treatment: 20 Subjective Chief Complaint Information obtained from Patient She is here in follow up evaluation of right foot ulcers History of Present Illness (HPI) The following HPI elements were documented for the patient's wound: Location: right lateral ankle Quality: Patient reports No Pain. Severity: Patient  states wound (s) are getting better. Duration: Patient has had the wound for > 1 months prior to seeking treatment at the wound center Context: The wound appeared gradually over time, and she thinks she might have hit it with a walker Modifying Factors: Patient is currently on renal dialysis and receives treatments 3 times weekly 61 year old patient known to our practice from several previous visits for various problems now comes back with an ulcerated area on her right lateral malleolus which she's had for about a month and she thinks she may have hit it with her walker. No other problems and no x-ray of this has been taken. She has a past medical history of atrial fibrillation, carotid artery disease, CHF, chronic kidney disease on hemodialysis with a functioning AV fistula seen by Dr. Corene Cornea dew, diabetes mellitus with peripheral neuropathy, pancreatitis and is status post abdominal hysterectomy, AV fistula placement in June 2016, back surgery, cholecystectomy, coronary angioplasty,several surgeries  for AV fistula, and dialysis catheters. She has never been a smoker. 10/17/2016 -- had a right ankle x-ray which showed no acute pathology of the right ankle. Arterial vascular calcifications are noted and there is generalized osteopenia and demineralization 11/11/16 Patient appears to be doing fairly well with regard to her right ankle wound. She has been tolerating the dressing changes with the Santyl that does seem to have loosened up the slough a bit. She has also been tolerating the debridement's when they have been performed. She tells me that she does tend to lay on her right side and this seems to be aggravating the issue. She does not have a Prevalon boot for use at this facility. Fortunately she is not having significant pain. 01/06/17 on evaluation today patient's right lateral ankle wound appears to be doing fairly well. There was some Slough covering although there is no evidence of  infection and she does not seem to have significant pain. She is pleased with how this is progressing compared to where it was in the beginning but wishes it would completely close. 01/20/17 on evaluation today patient's wound appears to be doing better. She did have some Slough covering the wound but overall there did not appear to be a significant amount of erythema and definitely no read Manygoats, Tylisa S. ([id number]) streaking from the wound itself. She also did not have significant pain. 01/27/17 on evaluation today patient appears to be doing okay although she unfortunately had a recent fall two days ago and during this fall she injured her right knee as well as her left lateral leg where there is a blister there. There is an abrasion over the knee. Unfortunately the knee has been giving her some discomfort and pain. Her right lateral ankle wound does appear to be doing a little bit better in my opinion in regard to the wound bed where I no longer visualize faster today but rather she does have pink granulation tissue. There is no evidence of infection. 02/13/2017 -- about 3 weeks ago she had had a fall and injured the medial part of her right foot which is now a full-fledged ulcerated area with slough. We have established this as a new wound today. 02/20/17 on evaluation today patient's wounds appeared to be doing about the same although the medial actually is slough covered. Fortunately or infection. She has no knowledge of, vomiting, diarrhea to be getting more significant issue such as sepsis and does not appear to have any pain. With that being said she does not appear to be as alert and oriented action has been in the past. This is obviously a mental shift compared to when I had previously seen her. Unfortunately her husband over the past week and I think this may be weighing heavy on her as well. ===== Old notes: 61 year old patient who is known to be a diabetic and has end-stage renal  disease has had several comorbidities including coronary artery disease, hypertension, hyperlipidemia, pancreatitis, anemia, previous history of hysterectomy, cholecystectomy, left-sided salivary gland excision, bilateral cataract surgery,Peritoneal dialysis catheter, hemodialysis catheter. the area on the back has also been caused by instant pressure she used to sleep on a recliner all day and has significant kyphoscoliosis. As far as the wound on her right lower extremity she's not sure how this blister occurred but she thought it has been there for about 2 weeks. No recent blood investigations available and no recent hemoglobin A1c. 12/30/2014 -- she is an assisted living facility but I  believe the nurses that have not followed instructions as she had some cream applied on her back and there was a different dressing. Last week she's had a AV fistula placed on her left forearm. 01/13/2015 -- she has had some localized infection at the port site and she's been on doxycycline for this. 02/05/2015 - he has developed a small blister on her right lower extremity. 02/10/2015 -- she has developed another small blister on her right anterior chest wall in the area where she's had tape for her dialysis access. this may just be injury caused by a tape burn. She had a dermatology opinion and they have taken a biopsy of her skin. She also had a left brachial AV fistula placed this week. ========= 02/27/17- She is here in follow up; states her blood sugars have been low recently, in the 60s; she denies any fever chills or complications with her wounds. She states she is tolerating dressing changes. There is improvement in measurements. [name], [name] ([id number]) Objective Constitutional hypertensive. afebrile. appears chronically ill. Vitals Time Taken: 9:21 AM, Height: 61 in, Weight: 160 lbs, BMI: 30.2, Temperature: 97.9 F, Pulse: 80 bpm, Respiratory Rate: 18 breaths/min, Blood Pressure: 153/64  mmHg. General Notes: Patient arrives today on 2L O2. Respiratory O2 in use. Cardiovascular RLE palpable DP and PT. Integumentary (Hair, Skin) Wound #10 status is Open. Original cause of wound was Trauma. The wound is located on the Right,Lateral Malleolus. The wound measures 0.3cm length x 0.3cm width x 0.2cm depth; 0.071cm^2 area and 0.014cm^3 volume. There is Fat Layer (Subcutaneous Tissue) Exposed exposed. There is undermining starting at 12:00 and ending at 12:00 with a maximum distance of 0.2cm. There is a medium amount of serous drainage noted. The wound margin is flat and intact. There is medium (34-66%) granulation within the wound bed. There is a small (1-33%) amount of necrotic tissue within the wound bed including Adherent Slough. The periwound skin appearance exhibited: Erythema. The periwound skin appearance did not exhibit: Callus, Crepitus, Excoriation, Induration, Rash, Scarring, Dry/Scaly, Maceration, Atrophie Blanche, Cyanosis, Ecchymosis, Hemosiderin Staining, Mottled, Pallor, Rubor. The surrounding wound skin color is noted with erythema which is circumferential. Periwound temperature was noted as No Abnormality. The periwound has tenderness on palpation. Wound #13 status is Open. Original cause of wound was Gradually Appeared. The wound is located on the Right,Medial Foot. The wound measures 0.5cm length x 0.9cm width x 0.2cm depth; 0.353cm^2 area and 0.071cm^3 volume. There is Fat Layer (Subcutaneous Tissue) Exposed exposed. There is a medium amount of serous drainage noted. The wound margin is distinct with the outline attached to the wound base. There is no granulation within the wound bed. There is a large (67-100%) amount of necrotic tissue within the wound bed including Adherent Slough. The periwound skin appearance exhibited: Induration, Maceration, Erythema. The surrounding wound skin color is noted with erythema which is circumferential. Periwound temperature  was noted as No Abnormality. The periwound has tenderness on palpation. Assessment Active Problems [name], [name] ([id number]) ICD-10 E11.621 - Type 2 diabetes mellitus with foot ulcer L97.312 - Non-pressure chronic ulcer of right ankle with fat layer exposed S80.211A - Abrasion, right knee, initial encounter N18.6 - End stage renal disease Z99.2 - Dependence on renal dialysis L97.512 - Non-pressure chronic ulcer of other part of right foot with fat layer exposed Procedures Wound #10 Pre-procedure diagnosis of Wound #10 is a Trauma, Other located on the Right,Lateral Malleolus . There was a Non-Viable Tissue Open Wound/Selective [phone]) debridement with total area  of 0.09 sq cm performed by Lawanda Cousins, NP. with the following instrument(s): Blade to remove Non-Viable tissue/material including Exudate and Fibrin/Slough after achieving pain control using Lidocaine 2% Topical Gel. A time out was conducted at 09:33, prior to the start of the procedure. A Minimum amount of bleeding was controlled with N/A. The procedure was tolerated well with a pain level of 0 throughout and a pain level of 0 following the procedure. Post Debridement Measurements: 0.3cm length x 0.3cm width x 0.2cm depth; 0.014cm^3 volume. Character of Wound/Ulcer Post Debridement requires further debridement. Post procedure Diagnosis Wound #10: Same as Pre-Procedure Wound #13 Pre-procedure diagnosis of Wound #13 is a Diabetic Wound/Ulcer of the Lower Extremity located on the Right,Medial Foot .Severity of Tissue Pre Debridement is: Fat layer exposed. There was a Non-Viable Tissue Open Wound/Selective [id number]) debridement with total area of 0.45 sq cm performed by Lawanda Cousins, NP. with the following instrument(s): Blade to remove Non-Viable tissue/material including Exudate and Fibrin/Slough after achieving pain control using Lidocaine 2% Topical Gel. A time out was conducted at 09:33, prior to the start of  the procedure. A Minimum amount of bleeding was controlled with N/A. The procedure was tolerated well with a pain level of 0 throughout and a pain level of 0 following the procedure. Post Debridement Measurements: 0.5cm length x 0.9cm width x 0.2cm depth; 0.071cm^3 volume. Character of Wound/Ulcer Post Debridement requires further debridement. Severity of Tissue Post Debridement is: Fat layer exposed. Post procedure Diagnosis Wound #13: Same as Pre-Procedure Plan Wound Cleansing: [name], [name] ([id number]) Wound #10 Right,Lateral Malleolus: Clean wound with Normal Saline. Cleanse wound with mild soap and water May Shower, gently pat wound dry prior to applying new dressing. Wound #13 Right,Medial Foot: Clean wound with Normal Saline. Cleanse wound with mild soap and water May Shower, gently pat wound dry prior to applying new dressing. Anesthetic: Wound #10 Right,Lateral Malleolus: Topical Lidocaine 4% cream applied to wound bed prior to debridement - for clinic use Wound #13 Right,Medial Foot: Topical Lidocaine 4% cream applied to wound bed prior to debridement - for clinic use Skin Barriers/Peri-Wound Care: Wound #10 Right,Lateral Malleolus: Skin Prep Wound #13 Right,Medial Foot: Skin Prep Primary Wound Dressing: Wound #10 Right,Lateral Malleolus: Prisma Ag - (collagen) moisten with saline Wound #13 Right,Medial Foot: Prisma Ag - (collagen) moisten with saline Secondary Dressing: Wound #10 Right,Lateral Malleolus: Boardered Foam Dressing Wound #13 Right,Medial Foot: Boardered Foam Dressing Dressing Change Frequency: Wound #10 Right,Lateral Malleolus: Change dressing every day. Wound #13 Right,Medial Foot: Change dressing every day. Follow-up Appointments: Wound #10 Right,Lateral Malleolus: Return Appointment in 1 week. Wound #13 Right,Medial Foot: Return Appointment in 1 week. Edema Control: Wound #10 Right,Lateral Malleolus: Patient to wear own compression  stockings Elevate legs to the level of the heart and pump ankles as often as possible Wound #13 Right,Medial Foot: Patient to wear own compression stockings Elevate legs to the level of the heart and pump ankles as often as possible [name], [name] ([id number]) 1. Continue with Prisma to both wounds 2. Follow-up next week Electronic Signature(s) Signed: 02/27/2017 5:23:40 PM By: Lawanda Cousins Entered By: Lawanda Cousins on 02/27/2017 10:21:19 [place], [name] ([id number]) -------------------------------------------------------------------------------- SuperBill Details [name]. Date of Service: 02/27/2017 Medical Record Number: [id number] Patient Account Number: [id number] Date of Birth/Sex: 29-May-1956 (61 y.o. 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 S80.211A Abrasion, right knee, initial encounter N18.6 End stage renal disease Z99.2 Dependence on renal dialysis L97.512 Non-pressure chronic ulcer of other part of right foot with fat layer exposed Facility Procedures CPT4 Code Description: [id number] - DEBRIDE WOUND 1ST 20 SQ CM OR < ICD-10 Description Diagnosis E11.621 Type 2 diabetes mellitus with foot ulcer L97.312 Non-pressure chronic ulcer of right ankle with fat la L97.512 Non-pressure chronic ulcer of  other part of right foo Modifier: yer exposed t with fat la Quantity: 1 yer exposed Physician Procedures CPT4 Code Description: [id number] - WC PHYS DEBR WO ANESTH 20 SQ CM ICD-10 Description Diagnosis E11.621 Type 2 diabetes mellitus with foot ulcer L97.312 Non-pressure chronic ulcer of right ankle with fat la L97.512 Non-pressure chronic ulcer  of other  part of right foo Modifier: yer exposed t with fat lay Quantity: 1 er exposed Electronic Signature(s) Signed: 02/27/2017 5:23:40 PM By: Lawanda Cousins Entered By: Lawanda Cousins on 02/27/2017 10:34:13

## 2017-02-28 NOTE — Progress Notes (Signed)
SHAHEEN, STAR ([id number]) Visit Report for 02/27/2017 Arrival Information Details [name], [name]. Date of Service: 02/27/2017 9:15 AM Medical Record Number: [id number] Patient Account Number: [id number] Date of Birth/Sex: [birth year] (61 y.o. Female) Treating RN: [name] Primary Care [name]: [name] Other Clinician: Referring [name]: [name] Treating [name]/Extender: [name] in Treatment: 57 Visit Information History Since Last Visit Added or deleted any medications: No Patient Arrived: Ambulatory Any new allergies or adverse reactions: No Arrival Time: 09:21 Had a fall or experienced change in No Accompanied By: self activities of daily living that may affect Transfer Assistance: None risk of falls: Patient Identification Verified: Yes Signs or symptoms of abuse/neglect since last No Secondary Verification Process Yes visito Completed: Hospitalized since last visit: No Patient Requires Transmission- No Has Dressing in Place as Prescribed: Yes Based Precautions: Pain Present Now: No Patient Has Alerts: Yes Patient Alerts: Patient on Blood Thinner Electronic Signature(s) Signed: 02/28/2017 7:32:57 AM By: [name], [name], RN, CWS, Kim RN, [name] Entered By: [name], [name], RN, CWS, [name] on 02/27/2017 09:21:26 [name], [name] ([id number]) -------------------------------------------------------------------------------- Encounter Discharge Information Details [name], [name]. Date of Service: 02/27/2017 9:15 AM Medical Record Number: [id number] Patient Account Number: [id number] Date of Birth/Sex: 04-19-1956 (61 y.o. Female) Treating RN: [name] Primary Care Cristen Bredeson: [name] Other Clinician: Referring Mechele Kittleson: [name] Treating Valen Mascaro/Extender: [name] in Treatment: 20 Encounter Discharge Information Items Discharge Pain Level: 0 Discharge Condition: Stable Ambulatory Status:  Wheelchair Nursing Discharge Destination: Home Transportation: Private Auto Accompanied By: driver Schedule Follow-up Appointment: Yes Medication Reconciliation completed Yes and provided to Patient/Care Devora Tortorella: Clinical Summary of Care: Electronic Signature(s) Signed: 02/28/2017 7:32:57 AM By: [name], [name], RN, CWS, Kim RN, [name] Entered By: [name], [name], RN, CWS, [name] on 02/27/2017 09:56:56 [name] -------------------------------------------------------------------------------- Lower Extremity Assessment Details Patient Name: [name], [name] Date of Service: 02/27/2017 9:15 AM Medical Record Number: [id number] Patient Account Number: [id number] Date of Birth/Sex: 10-Nov-1955 (61 y.o. Female) Treating RN: [name] Primary Care Kailie Polus: [name] Other Clinician: Referring Jaxyn Mestas: [name] Treating Cherie Lasalle/Extender: [name] in Treatment: 20 Vascular Assessment Pulses: Dorsalis Pedis Palpable: [Right:Yes] Posterior Tibial Extremity colors, hair growth, and conditions: Extremity Color: [Right:Hyperpigmented] Hair Growth on Extremity: [Right:No] Temperature of Extremity: [Right:Warm] Capillary Refill: [Right:< 3 seconds] Dependent Rubor: [Right:No] Blanched when Elevated: [Right:No] Lipodermatosclerosis: [Right:No] Toe Nail Assessment Left: Right: Thick: Yes Discolored: Yes Deformed: Yes Improper Length and Hygiene: Yes Electronic Signature(s) Signed: 02/28/2017 7:32:57 AM By: [name], [name], RN, CWS, Kim RN, [name] Entered By: [name], [name], RN, CWS, [name] on 02/27/2017 09:31:56 [name], [name] -------------------------------------------------------------------------------- Multi Wound Chart Details Patient Name: [name] Cornwall. Date of Service: 02/27/2017 9:15 AM Medical Record Number: [id number] Patient Account Number: [id number] Date of Birth/Sex: [birth year] (61 y.o. 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 Comorbid History: Glaucoma, Anemia, Glaucoma, Anemia, N/A Coronary Artery Disease, Coronary Artery Disease, Hypertension, Type II Hypertension, Type II Diabetes, End Stage Diabetes, End Stage Renal Disease, Renal Disease, Osteoarthritis, Neuropathy Osteoarthritis, Neuropathy Date Acquired: 09/12/2016 12/14/2016 N/A Weeks of Treatment: 20 2 N/A  Wound Status: Open Open N/A Measurements L x W x D 0.3x0.3x0.2 0.5x0.9x0.2 N/A (cm) Area (cm) : 0.071 0.353 N/A Volume (cm) : 0.014 0.071 N/A % Reduction in Area: 74.90% 50.10% N/A % Reduction in Volume: 75.40% 49.60% N/A Starting Position 1 12 (o'clock): Ending Position 1 12 (o'clock): Maximum Distance 1 0.2 (cm): [name], [name] Undermining: Yes N/A N/A Classification: Full Thickness Without Grade 1 N/A Exposed Support Structures HBO Classification: Grade 1 N/A N/A Exudate Amount: Medium Medium N/A Exudate Type: Serous Serous N/A Exudate Color: amber amber N/A Wound Margin: Flat and Intact Distinct, outline attached N/A Granulation Amount: Medium (34-66%) None Present (0%) N/A Necrotic Amount: Small (1-33%) Large (67-100%) N/A Exposed Structures: Fat Layer (Subcutaneous Fat Layer (Subcutaneous N/A Tissue) Exposed: Yes Tissue) Exposed: Yes Fascia: No Tendon: No Muscle: No Joint: No Bone: No Epithelialization: None None N/A Debridement: Open Wound/Selective Open Wound/Selective N/A ([id number]) -  Selective ([id number]) - Selective Pre-procedure 09:33 09:33 N/A Verification/Time Out Taken: Pain Control: Lidocaine 2% Topical Gel Lidocaine 2% Topical Gel N/A Tissue Debrided: Fibrin/Slough, Exudates Fibrin/Slough, Exudates N/A Level: Non-Viable Tissue Non-Viable Tissue N/A Debridement Area (sq 0.09 0.45 N/A cm): Instrument: Blade Blade N/A Bleeding: Minimum Minimum N/A Procedural Pain: 0 0 N/A Post Procedural Pain: 0 0 N/A Debridement Treatment Procedure was tolerated Procedure was tolerated N/A Response: well well Post Debridement 0.3x0.3x0.2 0.5x0.9x0.2 N/A Measurements L x W x D (cm) Post Debridement 0.014 0.071 N/A Volume: (cm) Periwound Skin Texture: Excoriation: No Induration: Yes N/A Induration: No Callus: No Crepitus: No Rash: No Scarring: No Periwound Skin Maceration: No Maceration: Yes N/A Moisture: Dry/Scaly: No Periwound Skin Color: Erythema: Yes N/A Ryant, [name] ([id number]) Erythema: Yes Atrophie Blanche: No Cyanosis: No Ecchymosis: No Hemosiderin Staining: No Mottled: No Pallor: No Rubor: No Erythema Location: Circumferential Circumferential N/A Temperature: No Abnormality No Abnormality N/A Tenderness on Yes Yes N/A Palpation: Wound Preparation: Ulcer Cleansing: Ulcer Cleansing: N/A Rinsed/Irrigated with Rinsed/Irrigated with Saline Saline Topical Anesthetic Topical Anesthetic Applied: Other: lidocaine Applied: Other: lidocaine 4% 4% Procedures Performed: Debridement Debridement N/A Treatment Notes Electronic Signature(s) Signed: 02/28/2017 7:32:57 AM By: [name], [name], RN, CWS, Kim RN, [name] Entered By: [name], [name], RN, CWS, [name] on 02/27/2017 09:56:01 [name], [name] ([id number]) -------------------------------------------------------------------------------- Multi-Disciplinary Care Plan Details Patient Name: DARNITA, WOODRUM. Date of Service: 02/27/2017 9:15 AM Medical Record Number: [id number] Patient Account Number: [id number] Date of  Birth/Sex: 12-06-1955 (61 y.o. Female) Treating RN: [name] Primary Care Ceonna Frazzini: [name] Other Clinician: Referring Kaytlen Lightsey: [name] Treating Dangela How/Extender: [name] in Treatment: 20 Active Inactive ` Abuse / Safety / Falls / Self Care Management Nursing Diagnoses: Potential for falls Goals: Patient will remain injury free Date Initiated: 10/10/2016 Target Resolution Date: 12/23/2016 Goal Status: Active Interventions: Assess fall risk on admission and as needed Notes: ` Nutrition Nursing Diagnoses: Potential for alteratiion in Nutrition/Potential for imbalanced nutrition Goals: Patient/caregiver agrees to and verbalizes understanding of need to use nutritional supplements and/or vitamins as prescribed Date Initiated: 10/10/2016 Target Resolution Date: 10/21/2016 Goal Status: Active Interventions: Assess patient nutrition upon admission and as needed per policy Notes: ` Orientation to the Wound Care Program Nursing Diagnoses: Knowledge deficit related to the wound healing center program Oran, YUVIA PLANT ([id number]) Goals: Patient/caregiver will verbalize understanding of the [place] Date Initiated: 10/10/2016 Target Resolution Date: 12/23/2016 Goal Status: Active Interventions: Provide education on orientation to the wound center Notes: ` Wound/Skin Impairment Nursing Diagnoses: Impaired tissue integrity Goals: Patient/caregiver will verbalize understanding of skin care  regimen Date Initiated: 10/10/2016 Target Resolution Date: 12/23/2016 Goal Status: Active Ulcer/skin breakdown will have a volume reduction of 30% by week 4 Date Initiated: 10/10/2016 Target Resolution Date: 12/23/2016 Goal Status: Active Ulcer/skin breakdown will have a volume reduction of 50% by week 8 Date Initiated: 10/10/2016 Target Resolution Date: 12/23/2016 Goal Status: Active Ulcer/skin breakdown will have a volume reduction of 80% by week  12 Date Initiated: 10/10/2016 Target Resolution Date: 12/23/2016 Goal Status: Active Ulcer/skin breakdown will heal within 14 weeks Date Initiated: 10/10/2016 Target Resolution Date: 12/23/2016 Goal Status: Active Interventions: Assess patient/caregiver ability to obtain necessary supplies Assess patient/caregiver ability to perform ulcer/skin care regimen upon admission and as needed Assess ulceration(s) every visit Notes: Electronic Signature(s) Signed: 02/28/2017 7:32:57 AM By: [name], [name], RN, CWS, Kim RN, [name] Entered By: [name], [name], RN, CWS, [name] on 02/27/2017 09:32:14 [name], [name] ([id number]) -------------------------------------------------------------------------------- Pain Assessment Details Patient Name: [name] Cornwall. Date of Service: 02/27/2017 9:15 AM Medical Record Number: [id number] Patient Account Number: [id number] Date of Birth/Sex: May 30, 1956 (61 y.o. Female) Treating RN: [name] Primary Care Ichael Pullara: [name] Other Clinician: Referring Navid Lenzen: [name] Treating Tramar Brueckner/Extender: [name] in Treatment: 20 Active Problems Location of Pain Severity and Description of Pain Patient Has Paino No Site Locations With Dressing Change: No Pain Management and Medication Current Pain Management: Goals for Pain Management Topical or injectable lidocaine is offered to patient for acute pain when surgical debridement is performed. If needed, Patient is instructed to use over the counter pain medication for the following 24-48 hours after debridement. Wound care MDs do not prescribed pain medications. Patient has chronic pain or uncontrolled pain. Patient has been instructed to make an appointment with their Primary Care Physician for pain management. Electronic Signature(s) Signed: 02/28/2017 7:32:57 AM By: [name], [name], RN, CWS, Kim RN, [name] Entered By: [name], [name], RN, CWS, [name] on 02/27/2017 09:21:49 [name], [name]  ([id number]) -------------------------------------------------------------------------------- Patient/Caregiver Education Details Patient Name: [name] Cornwall. Date of Service: 02/27/2017 9:15 AM Medical Record Number: [id number] Patient Account Number: [id number] Date of Birth/Gender: 1955-11-01 (61 y.o. Female) Treating RN: [name] Primary Care Physician: [name] Other Clinician: Referring Physician: Nicky [name] Treating Physician/Extender: [name] in Treatment: 20 Education Assessment Education Provided To: Patient Education Topics Provided Wound/Skin Impairment: Handouts: Caring for Your Ulcer, Other: continue wound care as prescribed Methods: Demonstration, Explain/Verbal Responses: State content correctly Electronic Signature(s) Signed: 02/28/2017 7:32:57 AM By: [name], [name], RN, CWS, Kim RN, [name] Entered By: [name], [name], RN, CWS, [name] on 02/27/2017 09:57:24 [name], [name] ([id number]) -------------------------------------------------------------------------------- Wound Assessment Details [name], [name]. Date of Service: 02/27/2017 9:15 AM Medical Record Number: [id number] Patient Account Number: [id number] Date of Birth/Sex: 1955-12-29 (61 y.o. Female) Treating RN: [name] Primary Care Dacota Ruben: [name] Other Clinician: Referring Dejon Jungman: [name] Treating Aison Malveaux/Extender: [name] in Treatment: 20 Wound Status Wound Number: 10 Primary Trauma, Other Etiology: Wound Location: Right Malleolus - Lateral Wound Open Wounding Event: Trauma Status: Date Acquired: 09/12/2016 Comorbid Glaucoma, Anemia, Coronary Artery Weeks Of Treatment: 20 History: Disease, Hypertension, Type II Clustered Wound: No Diabetes, End Stage Renal Disease, Osteoarthritis, Neuropathy Photos Wound Measurements Length: (cm) 0.3 Width: (cm) 0.3 Depth: (cm) 0.2 Area: (cm) 0.071 Volume: (cm) 0.014 % Reduction in Area: 74.9% %  Reduction in Volume: 75.4% Epithelialization: None Undermining: Yes Starting Position (o'clock): 12 Ending Position (o'clock): 12 Maximum Distance: (cm) 0.2 Wound Description Full Thickness Without Classification: Exposed Support Structures Diabetic Severity Grade 1 (Wagner): Wound Margin:  Flat and Intact Exudate Amount: Medium Exudate Type: Serous Exudate Color: amber Foul Odor After Cleansing: No Slough/Fibrino Yes Wound Bed Clowdus, Shanira S. ([id number]) Granulation Amount: Medium (34-66%) Exposed Structure Necrotic Amount: Small (1-33%) Fascia Exposed: No Necrotic Quality: Adherent Slough Fat Layer (Subcutaneous Tissue) Exposed: Yes Tendon Exposed: No Muscle Exposed: No Joint Exposed: No Bone Exposed: No Periwound Skin Texture Texture Color No Abnormalities Noted: No No Abnormalities Noted: No Callus: No Atrophie Blanche: No Crepitus: No Cyanosis: No Excoriation: No Ecchymosis: No Induration: No Erythema: Yes Rash: No Erythema Location: Circumferential Scarring: No Hemosiderin Staining: No Mottled: No Moisture Pallor: No No Abnormalities Noted: No Rubor: No Dry / Scaly: No Maceration: No Temperature / Pain Temperature: No Abnormality Tenderness on Palpation: Yes Wound Preparation Ulcer Cleansing: Rinsed/Irrigated with Saline Topical Anesthetic Applied: Other: lidocaine 4%, Treatment Notes Wound #10 (Right, Lateral Malleolus) 1. Cleansed with: Clean wound with Normal Saline 2. Anesthetic Topical Lidocaine 4% cream to wound bed prior to debridement 4. Dressing Applied: Prisma Ag 5. Secondary Dressing Applied Bordered Foam Dressing Electronic Signature(s) Signed: 02/28/2017 7:32:57 AM By: [name], [name], RN, CWS, Kim RN, [name] Entered By: [name], [name], RN, CWS, [name] on 02/27/2017 09:29:00 [name], [name] ([id number]) -------------------------------------------------------------------------------- Wound Assessment Details Patient Name: LEIANI, ENRIGHT. Date of Service: 02/27/2017 9:15 AM Medical Record Number: [id number] Patient Account Number: [id number] Date of Birth/Sex: August 13, 1955 (61 y.o. Female) Treating RN: [name] Primary Care Katerra Ingman: [name] Other Clinician: Referring Ura Yingling: [name] Treating Reatha Sur/Extender: [name] in Treatment: 20 Wound Status Wound Number: 13 Primary Diabetic Wound/Ulcer of the Lower Etiology: Extremity Wound Location: Right Foot - Medial Wound Open Wounding Event: Gradually Appeared Status: Date Acquired: 12/14/2016 Comorbid Glaucoma, Anemia, Coronary Artery Weeks Of Treatment: 2 History: Disease, Hypertension, Type II Clustered Wound: No Diabetes, End Stage Renal Disease, Osteoarthritis, Neuropathy Photos Wound Measurements Length: (cm) 0.5 Width: (cm) 0.9 Depth: (cm) 0.2 Area: (cm) 0.353 Volume: (cm) 0.071 % Reduction in Area: 50.1% % Reduction in Volume: 49.6% Epithelialization: None Wound Description Classification: Grade 1 Foul Odor Aft Wound Margin: Distinct, outline attached Slough/Fibrin Exudate Amount: Medium Exudate Type: Serous Exudate Color: amber er Cleansing: No o Yes Wound Bed Granulation Amount: None Present (0%) Exposed Structure Necrotic Amount: Large (67-100%) Fat Layer (Subcutaneous Tissue) Exposed: Yes Necrotic Quality: Adherent Slough Periwound Skin Texture Texture Color Ditto, Mikiya S. ([id number]) No Abnormalities Noted: No No Abnormalities Noted: No Induration: Yes Erythema: Yes Erythema Location: Circumferential Moisture No Abnormalities Noted: No Temperature / Pain Maceration: Yes Temperature: No Abnormality Tenderness on Palpation: Yes Wound Preparation Ulcer Cleansing: Rinsed/Irrigated with Saline Topical Anesthetic Applied: Other: lidocaine 4%, Treatment Notes Wound #13 (Right, Medial Foot) 1. Cleansed with: Clean wound with Normal Saline 2. Anesthetic Topical Lidocaine 4% cream to wound bed prior to  debridement 4. Dressing Applied: Prisma Ag 5. Secondary Dressing Applied Bordered Foam Dressing Electronic Signature(s) Signed: 02/28/2017 7:32:57 AM By: [name], [name], RN, CWS, Kim RN, [name] Entered By: [name], [name], RN, CWS, [name] on 02/27/2017 09:31:17 Finck, [name] ([id number]) -------------------------------------------------------------------------------- Vitals Details Patient Name: [name] Cornwall. Date of Service: 02/27/2017 9:15 AM Medical Record Number: [id number] Patient Account Number: [id number] Date of Birth/Sex: November 13, 1955 (61 y.o. Female) Treating RN: [name] Primary Care Nadina Fomby: [name] Other Clinician: Referring Shariff Lasky: [name] Treating Sotirios Navarro/Extender: [name] in Treatment: 20 Vital Signs Time Taken: 09:21 Temperature (F): 97.9 Height (in): 61 Pulse (bpm): 80 Weight (lbs): 160 Respiratory Rate (breaths/min): 18 Body Mass Index (BMI): 30.2 Blood Pressure (mmHg): 153/64 Reference  Range: 80 - 120 mg / dl Notes Patient arrives today on 2L O2. Electronic Signature(s) Signed: 02/28/2017 7:32:57 AM By: [name], [name], RN, CWS, Kim RN, [name] Entered By: [name], [name], RN, CWS, [name] on 02/27/2017 03:70:48

## 2017-03-06 ENCOUNTER — Encounter: Payer: Medicare Other | Admitting: Surgery

## 2017-03-06 DIAGNOSIS — E11621 Type 2 diabetes mellitus with foot ulcer: Secondary | ICD-10-CM | POA: Diagnosis not present

## 2017-03-07 NOTE — Progress Notes (Signed)
TISA, WEISEL ([id number]) Visit Report for 03/06/2017 Arrival Information Details [name], [name]. Date of Service: 03/06/2017 9:15 AM Medical Record Number: [id number] Patient Account Number: [id number] Date of Birth/Sex: Dec 28, 1955 (61 y.o. Female) Treating RN: Cornell Barman Primary Care Cheyrl Buley: [name] Other Clinician: Referring Nadya Hopwood: [name] Treating Merari Pion/Extender: Frann Rider in Treatment: 21 Visit Information History Since Last Visit Added or deleted any medications: No Patient Arrived: Wheel Chair Any new allergies or adverse reactions: No Arrival Time: 09:15 Had a fall or experienced change in No Accompanied By: self activities of daily living that may affect Transfer Assistance: Manual risk of falls: Patient Identification Verified: Yes Signs or symptoms of abuse/neglect since last No Secondary Verification Process Yes visito Completed: Has Dressing in Place as Prescribed: Yes Patient Requires Transmission- No Pain Present Now: No Based Precautions: Patient Has Alerts: Yes Patient Alerts: Patient on Blood Thinner Notes Patient arrived to the wound care center today with an empty O2 tank. Patient states she is seeing patches of darkness. O2 sats were 85. I have placed the patient on O2. She is currently on 2L and sats are at 100%. Electronic Signature(s) Signed: 03/06/2017 4:59:52 PM By: Gretta Cool, [name], RN, CWS, Kim RN, [name] Entered By: Gretta Cool, [name], RN, CWS, Kim on 03/06/2017 09:17:42 Tyrell, [name] ([id number]) -------------------------------------------------------------------------------- Encounter Discharge Information Details [name], [name]. Date of Service: 03/06/2017 9:15 AM Medical Record Number: [id number] Patient Account Number: [id number] Date of Birth/Sex: Oct 30, 1955 (61 y.o. Female) Treating RN: Cornell Barman Primary Care Santiago Stenzel: [name] Other Clinician: Referring Tashe Purdon: [name] Treating  Kimberlyann Hollar/Extender: Frann Rider in Treatment: 21 Encounter Discharge Information Items Discharge Pain Level: 0 Discharge Condition: Stable Ambulatory Status: Walker Discharge Destination: Nursing Home Transportation: Private Auto Accompanied By: self Schedule Follow-up Appointment: Yes Medication Reconciliation completed Yes and provided to Patient/Care Donaldson Richter: Provided on Clinical Summary of Care: 03/06/2017 Form Type Recipient Paper Patient LD Electronic Signature(s) Signed: 03/06/2017 4:59:52 PM By: Gretta Cool, [name], RN, CWS, Kim RN, [name] Entered By: Gretta Cool, [name], RN, CWS, Kim on 03/06/2017 10:00:45 Yard, [name] ([id number]) -------------------------------------------------------------------------------- Lower Extremity Assessment Details [name], [name]. Date of Service: 03/06/2017 9:15 AM Medical Record Number: [id number] Patient Account Number: [id number] Date of Birth/Sex: 06/06/1956 (61 y.o. Female) Treating RN: Cornell Barman Primary Care Olesya Wike: [name] Other Clinician: Referring Ron Junco: [name] Treating Aime Carreras/Extender: Frann Rider in Treatment: 21 Edema Assessment Assessed: [Left: No] [Right: No] Edema: [Left: N] [Right: o] Vascular Assessment Pulses: Dorsalis Pedis Palpable: [Right:Yes] Posterior Tibial Extremity colors, hair growth, and conditions: Extremity Color: [Right:Hyperpigmented] Hair Growth on Extremity: [Right:No] Temperature of Extremity: [Right:Warm] Capillary Refill: [Right:< 3 seconds] Dependent Rubor: [Right:No] Blanched when Elevated: [Right:No] Lipodermatosclerosis: [Right:No] Toe Nail Assessment Left: Right: Thick: No Discolored: No Deformed: No Improper Length and Hygiene: No Electronic Signature(s) Signed: 03/06/2017 4:59:52 PM By: Gretta Cool, [name], RN, CWS, Kim RN, [name] Entered By: Gretta Cool, [name], RN, CWS, Kim on 03/06/2017 09:28:05 Strathman, [name]  ([id number]) -------------------------------------------------------------------------------- Multi Wound Chart Details [name]. Date of Service: 03/06/2017 9:15 AM Medical Record Number: [id number] Patient Account Number: [id number] Date of Birth/Sex: 11/08/1955 (61 y.o. 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 Comorbid History: Glaucoma, Anemia, Glaucoma, Anemia, N/A Coronary Artery Disease, Coronary Artery Disease, Hypertension, Type II Hypertension, Type II Diabetes, End Stage Diabetes, End Stage Renal Disease, Renal Disease, Osteoarthritis, Neuropathy Osteoarthritis, Neuropathy Date Acquired: 09/12/2016 12/14/2016 N/A Weeks of Treatment: 21 3 N/A Wound Status: Open Open N/A Measurements L x W x D 0.1x0.1x0.2 0.6x0.7x0.2 N/A (cm) Area (cm) : 0.008 0.33 N/A Volume (cm) : 0.002 0.066 N/A % Reduction in Area: 97.20% 53.30% N/A % Reduction in Volume: 96.50% 53.20% N/A Starting Position 1 1 (o'clock): Ending Position 1 1 (o'clock): Maximum Distance 1 0.2 (cm): Eguia, Jocelyn S. ([id number]) Undermining: Yes No N/A Classification: Full Thickness Without Grade 1 N/A Exposed Support Structures HBO Classification: Grade 1 N/A N/A Exudate Amount: Medium Medium N/A Exudate Type: Serous Serous N/A Exudate Color: amber amber N/A Wound Margin: Flat and Intact Distinct, outline attached N/A Granulation Amount: Medium (34-66%) None Present  (0%) N/A Granulation Quality: Pale N/A N/A Necrotic Amount: Small (1-33%) Large (67-100%) N/A Exposed Structures: Fat Layer (Subcutaneous Fat Layer (Subcutaneous N/A Tissue) Exposed: Yes Tissue) Exposed: Yes Fascia: No Tendon: No Muscle: No Joint: No Bone: No Epithelialization: None None N/A Debridement: N/A Debridement ([id number]) Pre-procedure N/A 09:35 N/A Verification/Time Out Taken: Pain Control: N/A Other N/A Tissue Debrided: N/A Fibrin/Slough, N/A Subcutaneous Level: N/A Skin/Subcutaneous N/A Tissue Debridement Area (sq N/A 0.42 N/A cm): Instrument: N/A Curette N/A Bleeding: N/A Minimum N/A Hemostasis Achieved: N/A Pressure N/A Procedural Pain: N/A 2 N/A Post Procedural Pain: N/A 2 N/A Debridement Treatment N/A Procedure was tolerated N/A Response: well Post Debridement N/A 0.6x0.7x0.3 N/A Measurements L x W x D (cm) Post Debridement N/A 0.099 N/A Volume: (cm) Periwound Skin Texture: Excoriation: No Induration: Yes N/A Induration: No Callus: No Crepitus: No Kistner, Fatumata S. ([id number]) Rash: No Scarring: No Periwound Skin Maceration: No Maceration: Yes N/A Moisture: Dry/Scaly: No Periwound Skin Color: Erythema: Yes Erythema: Yes N/A Atrophie Blanche: No Rubor: Yes Cyanosis: No Ecchymosis: No Hemosiderin Staining: No Mottled: No Pallor: No Rubor: No Erythema Location: Circumferential Circumferential N/A Temperature: No Abnormality No Abnormality N/A Tenderness on Yes Yes N/A Palpation: Wound Preparation: Ulcer Cleansing: Ulcer Cleansing: N/A Rinsed/Irrigated with Rinsed/Irrigated with Saline Saline Topical Anesthetic Topical Anesthetic Applied: Other: lidocaine Applied: Other: lidocaine 4% 4% Procedures Performed: N/A Debridement N/A Treatment Notes Electronic Signature(s) Signed: 03/06/2017 3:16:27 PM By: [name], [name] Entered By: [name] on 03/06/2017 09:42:06 Schweikert, [name]  ([id number]) -------------------------------------------------------------------------------- [place] Details [name], [name]. Date of Service: 03/06/2017 9:15 AM Medical Record Number: [id number] Patient Account Number: [id number] Date of Birth/Sex: [birth year] (61 y.o. Female) Treating RN: Cornell Barman Primary Care Athanasios [name]: [name] Other Clinician: Referring Zorawar Strollo: [name] Treating Makhayla Mcmurry/Extender: Frann Rider in Treatment: 21 Active Inactive ` Abuse / Safety / Falls / Self Care Management Nursing Diagnoses: Potential for falls Goals: Patient will remain injury free Date Initiated: 10/10/2016 Target Resolution Date: 12/23/2016 Goal Status: Active Interventions: Assess fall risk on admission and as needed Notes: ` Nutrition Nursing Diagnoses: Potential for alteratiion in Nutrition/Potential for imbalanced nutrition Goals: Patient/caregiver agrees to and verbalizes understanding of need to use nutritional supplements and/or vitamins as prescribed Date Initiated: 10/10/2016 Target Resolution Date: 10/21/2016 Goal Status: Active Interventions: Assess patient nutrition upon admission and as needed per policy Notes: ` Orientation to the Wound Care Program Nursing Diagnoses: Knowledge deficit related to the wound healing center program Rancourt, Anadelia S. (  [id number]) Goals: Patient/caregiver will verbalize understanding of the [place] Date Initiated: 10/10/2016 Target Resolution Date: 12/23/2016 Goal Status: Active Interventions: Provide education on orientation to the wound center Notes: ` Wound/Skin Impairment Nursing Diagnoses: Impaired tissue integrity Goals: Patient/caregiver will verbalize understanding of skin care regimen Date Initiated: 10/10/2016 Target Resolution Date: 12/23/2016 Goal Status: Active Ulcer/skin breakdown will have a volume reduction of 30% by week 4 Date Initiated:  10/10/2016 Target Resolution Date: 12/23/2016 Goal Status: Active Ulcer/skin breakdown will have a volume reduction of 50% by week 8 Date Initiated: 10/10/2016 Target Resolution Date: 12/23/2016 Goal Status: Active Ulcer/skin breakdown will have a volume reduction of 80% by week 12 Date Initiated: 10/10/2016 Target Resolution Date: 12/23/2016 Goal Status: Active Ulcer/skin breakdown will heal within 14 weeks Date Initiated: 10/10/2016 Target Resolution Date: 12/23/2016 Goal Status: Active Interventions: Assess patient/caregiver ability to obtain necessary supplies Assess patient/caregiver ability to perform ulcer/skin care regimen upon admission and as needed Assess ulceration(s) every visit Notes: Electronic Signature(s) Signed: 03/06/2017 4:59:52 PM By: Gretta Cool, [name], RN, CWS, Kim RN, [name] Entered By: Gretta Cool, [name], RN, CWS, Kim on 03/06/2017 09:28:11 Edelen, [name] ([id number]) -------------------------------------------------------------------------------- Pain Assessment Details [name]. Date of Service: 03/06/2017 9:15 AM Medical Record Number: [id number] Patient Account Number: [id number] Date of Birth/Sex: 1955-08-22 (61 y.o. Female) Treating RN: Cornell Barman Primary Care Armonee Bojanowski: [name] Other Clinician: Referring Blondina Coderre: [name] Treating Anysha Frappier/Extender: Frann Rider in Treatment: 21 Active Problems Location of Pain Severity and Description of Pain Patient Has Paino No Site Locations With Dressing Change: No Pain Management and Medication Current Pain Management: Goals for Pain Management Topical or injectable lidocaine is offered to patient for acute pain when surgical debridement is performed. If needed, Patient is instructed to use over the counter pain medication for the following 24-48 hours after debridement. Wound care MDs do not prescribed pain medications. Patient has chronic pain or uncontrolled pain. Patient has been instructed  to make an appointment with their Primary Care Physician for pain management. Electronic Signature(s) Signed: 03/06/2017 4:59:52 PM By: Gretta Cool, [name], RN, CWS, Kim RN, [name] Entered By: Gretta Cool, [name], RN, CWS, Kim on 03/06/2017 09:17:50 Stones, [name] ([id number]) -------------------------------------------------------------------------------- Patient/Caregiver Education Details [name]. Date of Service: 03/06/2017 9:15 AM Medical Record Number: [id number] Patient Account Number: [id number] Date of Birth/Gender: [birth year] (61 y.o. Female) Treating RN: Cornell Barman Primary Care Physician: [name] Other Clinician: Referring Physician: [name] Treating Physician/Extender: Frann Rider in Treatment: 21 Education Assessment Education Provided To: Patient Education Topics Provided Wound/Skin Impairment: Handouts: Caring for Your Ulcer, Other: wound care as prescribed; note sent to [facility] Methods: Demonstration Responses: State content correctly Electronic Signature(s) Signed: 03/06/2017 4:59:52 PM By: Gretta Cool, [name], RN, CWS, Kim RN, [name] Entered By: Gretta Cool, [name], RN, CWS, Kim on 03/06/2017 10:01:19 Chittum, [name] ([id number]) -------------------------------------------------------------------------------- Wound Assessment Details [name], [name]. Date of Service: 03/06/2017 9:15 AM Medical Record Number: [id number] Patient Account Number: [id number] Date of Birth/Sex: [birth year] (61 y.o. Female) Treating RN: Cornell Barman Primary Care Kade Demicco: [name] Other Clinician: Referring Sincere Berlanga: [name] Treating Syair Fricker/Extender: Frann Rider in Treatment: 21 Wound Status Wound Number: 10 Primary Trauma, Other Etiology: Wound Location: Right Malleolus - Lateral Wound Open Wounding Event: Trauma Status: Date Acquired: 09/12/2016 Comorbid Glaucoma, Anemia, Coronary Artery Weeks Of Treatment: 21 History: Disease,  Hypertension, Type II Clustered Wound: No Diabetes, End Stage Renal Disease, Osteoarthritis, Neuropathy Photos Wound Measurements Length: (cm) 0.1 Width: (cm)  0.1 Depth: (cm) 0.2 Area: (cm) 0.008 Volume: (cm) 0.002 % Reduction in Area: 97.2% % Reduction in Volume: 96.5% Epithelialization: None Tunneling: No Undermining: Yes Starting Position (o'clock): 1 Ending Position (o'clock): 1 Maximum Distance: (cm) 0.2 Wound Description Full Thickness Without Classification: Exposed Support Structures Diabetic Severity Grade 1 (Wagner): Wound Margin: Flat and Intact Exudate Amount: Medium Exudate Type: Serous Exudate Color: amber Zweber, Charle Chauncey Cruel ([id number]) Foul Odor After Cleansing: No Slough/Fibrino Yes Wound Bed Granulation Amount: Medium (34-66%) Exposed Structure Granulation Quality: Pale Fascia Exposed: No Necrotic Amount: Small (1-33%) Fat Layer (Subcutaneous Tissue) Exposed: Yes Necrotic Quality: Adherent Slough Tendon Exposed: No Muscle Exposed: No Joint Exposed: No Bone Exposed: No Periwound Skin Texture Texture Color No Abnormalities Noted: No No Abnormalities Noted: No Callus: No Atrophie Blanche: No Crepitus: No Cyanosis: No Excoriation: No Ecchymosis: No Induration: No Erythema: Yes Rash: No Erythema Location: Circumferential Scarring: No Hemosiderin Staining: No Mottled: No Moisture Pallor: No No Abnormalities Noted: No Rubor: No Dry / Scaly: No Maceration: No Temperature / Pain Temperature: No Abnormality Tenderness on Palpation: Yes Wound Preparation Ulcer Cleansing: Rinsed/Irrigated with Saline Topical Anesthetic Applied: Other: lidocaine 4%, Treatment Notes Wound #13 (Right, Medial Foot) 1. Cleansed with: Clean wound with Normal Saline 2. Anesthetic Topical Lidocaine 4% cream to wound bed prior to debridement 5. Secondary Dressing Applied Bordered Foam Dressing 7. Secured with Paper tape Notes BFD only on lateral  malleolus. Electronic Signature(s) Signed: 03/06/2017 4:59:52 PM By: Gretta Cool, [name], RN, CWS, Kim RN, [name] Entered By: Gretta Cool, [name], RN, CWS, Kim on 03/06/2017 09:25:10 Mcghee, [name] ([id number]) Kimes, [name] ([id number]) -------------------------------------------------------------------------------- Wound Assessment Details [name], [name]. Date of Service: 03/06/2017 9:15 AM Medical Record Number: [id number] Patient Account Number: [id number] Date of Birth/Sex: 1956-02-12 (61 y.o. Female) Treating RN: Cornell Barman Primary Care Elen Acero: [name] Other Clinician: Referring Dyllon Henken: [name] Treating Rachid Parham/Extender: Frann Rider in Treatment: 21 Wound Status Wound Number: 13 Primary Diabetic Wound/Ulcer of the Lower Etiology: Extremity Wound Location: Right Foot - Medial Wound Open Wounding Event: Gradually Appeared Status: Date Acquired: 12/14/2016 Comorbid Glaucoma, Anemia, Coronary Artery Weeks Of Treatment: 3 History: Disease, Hypertension, Type II Clustered Wound: No Diabetes, End Stage Renal Disease, Osteoarthritis, Neuropathy Photos Wound Measurements Length: (cm) 0.6 Width: (cm) 0.7 Depth: (cm) 0.2 Area: (cm) 0.33 Volume: (cm) 0.066 % Reduction in Area: 53.3% % Reduction in Volume: 53.2% Epithelialization: None Tunneling: No Undermining: No Wound Description Classification: Grade 1 Foul Odor Aft Wound Margin: Distinct, outline attached Slough/Fibrin Exudate Amount: Medium Exudate Type: Serous Exudate Color: amber er Cleansing: No o Yes Wound Bed Granulation Amount: None Present (0%) Exposed Structure Necrotic Amount: Large (67-100%) Fat Layer (Subcutaneous Tissue) Exposed: Yes Necrotic Quality: Adherent Slough Periwound Skin Texture Texture Color Crisp, Genevieve S. ([id number]) No Abnormalities Noted: No No Abnormalities Noted: No Induration: Yes Erythema: Yes Erythema Location: Circumferential Moisture Rubor: Yes No  Abnormalities Noted: No Maceration: Yes Temperature / Pain Temperature: No Abnormality Tenderness on Palpation: Yes Wound Preparation Ulcer Cleansing: Rinsed/Irrigated with Saline Topical Anesthetic Applied: Other: lidocaine 4%, Electronic Signature(s) Signed: 03/06/2017 4:59:52 PM By: Gretta Cool, [name], RN, CWS, Kim RN, [name] Entered By: Gretta Cool, [name], RN, CWS, Kim on 03/06/2017 09:25:56 Purnell, [name] ([id number]) -------------------------------------------------------------------------------- Vitals Details [name]. Date of Service: 03/06/2017 9:15 AM Medical Record Number: [id number] Patient Account Number: [id number] Date of Birth/Sex: 1955/08/03 (61 y.o. Female) Treating RN: Cornell Barman Primary Care Deema Juncaj: [name] Other Clinician: Referring Rasaan Brotherton: [name] Treating Yuktha Kerchner/Extender: Frann Rider  in Treatment: 21 Vital Signs Time Taken: 09:15 Pulse (bpm): 80 Height (in): 61 Respiratory Rate (breaths/min): 18 Weight (lbs): 160 Blood Pressure (mmHg): 118/48 Body Mass Index (BMI): 30.2 Reference Range: 80 - 120 mg / dl Pulse Oximetry (%): 100 Notes Patient arrives today with empty O2 tank. Patient's sats upon arrival are 85%. Electronic Signature(s) Signed: 03/06/2017 4:59:52 PM By: Gretta Cool, [name], RN, CWS, Kim RN, [name] Entered By: Gretta Cool, [name], RN, CWS, Kim on 03/06/2017 09:19:32

## 2017-03-07 NOTE — Progress Notes (Signed)
JAIMIE, PIPPINS ([id number]) Visit Report for 03/06/2017 Chief Complaint Document Details [name], [name]. Date of Service: 03/06/2017 9:15 AM Medical Record Number: [id number] Patient Account Number: [id number] Date of Birth/Sex: [birth year] (61 y.o. Female) Treating RN: Cornell Barman Primary Care Provider: [name] Other Clinician: Referring Provider: [name] Treating Provider/Extender: Frann Rider in Treatment: 21 Information Obtained from: Patient Chief Complaint She is here in follow up evaluation of right foot ulcers Electronic Signature(s) Signed: 03/06/2017 3:16:27 PM By: [name], [name] Entered By: [name] on 03/06/2017 09:42:27 Mensch, [name] ([id number]) -------------------------------------------------------------------------------- Debridement Details [name]. Date of Service: 03/06/2017 9:15 AM Medical Record Number: [id number] Patient Account Number: [id number] Date of Birth/Sex: [birth year] (61 y.o. Female) Treating RN: Cornell Barman Primary Care Provider: [name] Other Clinician: Referring Provider: [name] Treating Provider/Extender: Frann Rider in Treatment: 21 Debridement Performed for Wound #13 Right,Medial Foot Assessment: Performed By: Physician [name], MD Debridement: Debridement Severity of Tissue Pre Fat layer exposed Debridement: Pre-procedure Verification/Time Out Yes - 09:35 Taken: Start Time: 09:36 Pain Control: Other : lidocoane 4% Level: Skin/Subcutaneous Tissue Total Area Debrided (L x 0.6 (cm) x 0.7 (cm) = 0.42 (cm) W): Tissue and other Viable, Non-Viable, Fibrin/Slough, Subcutaneous material debrided: Instrument: Curette Bleeding: Minimum Hemostasis Achieved: Pressure End Time: 09:36 Procedural Pain: 2 Post Procedural Pain: 2 Response to Treatment: Procedure was tolerated well Post Debridement Measurements of Total Wound Length: (cm) 0.6 Width:  (cm) 0.7 Depth: (cm) 0.3 Volume: (cm) 0.099 Character of Wound/Ulcer Post Requires Further Debridement Debridement: Severity of Tissue Post Debridement: Fat layer exposed Post Procedure Diagnosis Same as Pre-procedure Electronic Signature(s) Signed: 03/06/2017 3:16:27 PM By: [name], [name] Signed: 03/06/2017 4:59:52 PM By: Gretta Cool [name], RN, CWS, Kim RN, [name] Ahr, [place]. ([id number]) Entered By: [name] on 03/06/2017 09:42:19 Stoecker, [name] ([id number]) -------------------------------------------------------------------------------- HPI Details [name]. Date of Service: 03/06/2017 9:15 AM Medical Record Number: [id number] Patient Account Number: [id number] Date of Birth/Sex: Mar 25, 1956 (61 y.o. Female) Treating RN: Cornell Barman Primary Care Provider: [name] Other Clinician: Referring Provider: [name] Treating Provider/Extender: Frann Rider in Treatment: 21 History of Present Illness Location: right lateral ankle Quality: Patient reports No Pain. Severity: Patient states wound (s) are getting better. Duration: Patient has had the wound for > 1 months prior to seeking treatment at the wound center Context: The wound appeared gradually over time, and she thinks she might have hit it with a walker Modifying Factors: Patient is currently on renal dialysis and receives treatments 3 times weekly HPI Description: 61 year old patient known to our practice from several previous visits for various problems now comes back with an ulcerated area on her right lateral malleolus which she's had for about a month and she thinks she may have hit it with her walker. No other problems and no x-ray of this has been taken. She has a past medical history of atrial fibrillation, carotid artery disease, CHF, chronic kidney disease on hemodialysis with a functioning AV fistula seen by Dr. Corene Cornea dew, diabetes mellitus with peripheral neuropathy,  pancreatitis and is status post abdominal hysterectomy, AV fistula placement in June 2016, back surgery, cholecystectomy, coronary angioplasty,several surgeries for AV fistula, and dialysis catheters. She has never been a smoker. 10/17/2016 -- had a right ankle x-ray which showed no acute pathology of the right ankle. Arterial vascular calcifications are noted and there is generalized osteopenia and demineralization 11/11/16 Patient appears to be doing fairly  well with regard to her right ankle wound. She has been tolerating the dressing changes with the Santyl that does seem to have loosened up the slough a bit. She has also been tolerating the debridement's when they have been performed. She tells me that she does tend to lay on her right side and this seems to be aggravating the issue. She does not have a Prevalon boot for use at this facility. Fortunately she is not having significant pain. 01/06/17 on evaluation today patient's right lateral ankle wound appears to be doing fairly well. There was some Slough covering although there is no evidence of infection and she does not seem to have significant pain. She is pleased with how this is progressing compared to where it was in the beginning but wishes it would completely close. 01/20/17 on evaluation today patient's wound appears to be doing better. She did have some Slough covering the wound but overall there did not appear to be a significant amount of erythema and definitely no read streaking from the wound itself. She also did not have significant pain. 01/27/17 on evaluation today patient appears to be doing okay although she unfortunately had a recent fall two days ago and during this fall she injured her right knee as well as her left lateral leg where there is a blister there. There is an abrasion over the knee. Unfortunately the knee has been giving her some discomfort and pain. Her right lateral ankle wound does appear to be doing a  little bit better in my opinion in regard to the wound bed where I no longer visualize faster today but rather she does have pink granulation Cundari, [name] ([id number]) tissue. There is no evidence of infection. 02/13/2017 -- about 3 weeks ago she had had a fall and injured the medial part of her right foot which is now a full-fledged ulcerated area with slough. We have established this as a new wound today. 02/20/17 on evaluation today patient's wounds appeared to be doing about the same although the medial actually is slough covered. Fortunately or infection. She has no knowledge of, vomiting, diarrhea to be getting more significant issue such as sepsis and does not appear to have any pain. With that being said she does not appear to be as alert and oriented action has been in the past. This is obviously a mental shift compared to when I had previously seen her. Unfortunately her husband over the past week and I think this may be weighing heavy on her as well. ===== Old notes: 61 year old patient who is known to be a diabetic and has end-stage renal disease has had several comorbidities including coronary artery disease, hypertension, hyperlipidemia, pancreatitis, anemia, previous history of hysterectomy, cholecystectomy, left-sided salivary gland excision, bilateral cataract surgery,Peritoneal dialysis catheter, hemodialysis catheter. the area on the back has also been caused by instant pressure she used to sleep on a recliner all day and has significant kyphoscoliosis. As far as the wound on her right lower extremity she's not sure how this blister occurred but she thought it has been there for about 2 weeks. No recent blood investigations available and no recent hemoglobin A1c. 12/30/2014 -- she is an assisted living facility but I believe the nurses that have not followed instructions as she had some cream applied on her back and there was a different dressing. Last week she's had a  AV fistula placed on her left forearm. 01/13/2015 -- she has had some localized infection at the port site  and she's been on doxycycline for this. 02/05/2015 - he has developed a small blister on her right lower extremity. 02/10/2015 -- she has developed another small blister on her right anterior chest wall in the area where she's had tape for her dialysis access. this may just be injury caused by a tape burn. She had a dermatology opinion and they have taken a biopsy of her skin. She also had a left brachial AV fistula placed this week. ========= 02/27/17- She is here in follow up; states her blood sugars have been low recently, in the 60s; she denies any fever chills or complications with her wounds. She states she is tolerating dressing changes. There is improvement in measurements. 03/06/2017 -- the patient has recently had the death of her husband and is a bit depressed but other than that she has been doing well with his dressing changes Electronic Signature(s) Signed: 03/06/2017 3:16:27 PM By: [name], [name] Entered By: [name] on 03/06/2017 09:42:55 [name], [name] ([id number]) -------------------------------------------------------------------------------- Physical Exam Details Patient Name: Mcfarren, 76. Date of Service: 03/06/2017 9:15 AM Medical Record Number: [id number] Patient Account Number: [id number] Date of Birth/Sex: [birth year] (61 y.o. Female) Treating RN: Cornell Barman Primary Care Provider: [name] Other Clinician: Referring Provider: [name] Treating Provider/Extender: Frann Rider in Treatment: 21 Constitutional . Pulse regular. Respirations normal and unlabored. Afebrile. . Eyes Nonicteric. Reactive to light. Ears, Nose, Mouth, and Throat Lips, teeth, and gums WNL.Marland Kitchen Moist mucosa without lesions. Neck supple and nontender. No palpable supraclavicular or cervical adenopathy. Normal sized without goiter. Respiratory WNL. No  retractions.. Cardiovascular Pedal Pulses WNL. No clubbing, cyanosis or edema. Lymphatic No adneopathy. No adenopathy. No adenopathy. Musculoskeletal Adexa without tenderness or enlargement.. Digits and nails w/o clubbing, cyanosis, infection, petechiae, ischemia, or inflammatory conditions.. Integumentary (Hair, Skin) No suspicious lesions. No crepitus or fluctuance. No peri-wound warmth or erythema. No masses.Marland Kitchen Psychiatric Judgement and insight Intact.. No evidence of depression, anxiety, or agitation.. Notes the lateral wound is very shallow and undermines a bit better at this stage is too small for application of Prisma and we will protect this with a bordered foam. The medial ankle wound has a lot of slough and this need sharp debridement with a #3 curet and will benefit from Santyl ointment locally Electronic Signature(s) Signed: 03/06/2017 3:16:27 PM By: [name], [name] Entered By: [name] on 03/06/2017 09:59:54 Plemmons, [name] ([id number]) -------------------------------------------------------------------------------- Physician Orders Details [name]. Date of Service: 03/06/2017 9:15 AM Medical Record Number: [id number] Patient Account Number: [id number] Date of Birth/Sex: [birth year] (61 y.o. Female) Treating RN: Cornell Barman Primary Care Provider: [name] Other Clinician: Referring Provider: [name] Treating Provider/Extender: Frann Rider in Treatment: 21 Verbal / Phone Orders: Yes Clinician: Cornell Barman Read Back and Verified: Yes Diagnosis Coding Wound Cleansing Wound #10 Right,Lateral Malleolus o Clean wound with Normal Saline. o Cleanse wound with mild soap and water o May Shower, gently pat wound dry prior to applying new dressing. Wound #13 Right,Medial Foot o Clean wound with Normal Saline. o Cleanse wound with mild soap and water o May Shower, gently pat wound dry prior to applying new  dressing. Anesthetic Wound #10 Right,Lateral Malleolus o Topical Lidocaine 4% cream applied to wound bed prior to debridement - for clinic use Wound #13 Right,Medial Foot o Topical Lidocaine 4% cream applied to wound bed prior to debridement - for clinic use Skin Barriers/Peri-Wound Care Wound #10 Right,Lateral Malleolus o Skin Prep Wound #13 Right,Medial  Foot o Skin Prep Primary Wound Dressing Wound #10 Right,Lateral Malleolus o Boardered Foam Dressing Wound #13 Right,Medial Foot o Santyl Ointment o Boardered Foam Dressing Secondary Dressing Wound #13 Right,Medial Foot o Boardered Foam Dressing Weitzel, [name] ([id number]) Dressing Change Frequency Wound #10 Right,Lateral Malleolus o Change Dressing Tuesday and Thursday. Wound #13 Right,Medial Foot o Change dressing every day. Follow-up Appointments Wound #10 Right,Lateral Malleolus o Return Appointment in 1 week. Wound #13 Right,Medial Foot o Return Appointment in 1 week. Edema Control Wound #10 Right,Lateral Malleolus o Patient to wear own compression stockings o Elevate legs to the level of the heart and pump ankles as often as possible Notes O2 sent with patient Electronic Signature(s) Signed: 03/06/2017 3:16:27 PM By: [name], [name] Signed: 03/06/2017 4:59:52 PM By: Gretta Cool, [name], RN, CWS, Kim RN, [name] Entered By: Gretta Cool, [name], RN, CWS, Kim on 03/06/2017 09:50:23 Quijas, [name] ([id number]) -------------------------------------------------------------------------------- Problem List Details Patient Name: MIIA, BLANKS. Date of Service: 03/06/2017 9:15 AM Medical Record Number: [id number] Patient Account Number: [id number] Date of Birth/Sex: July 06, 1956 (61 y.o. Female) Treating RN: Cornell Barman Primary Care Provider: [name] Other Clinician: Referring Provider: [name] Treating Provider/Extender: Frann Rider in Treatment: 21 Active Problems ICD-10 Encounter Code  Description Active Date Diagnosis E11.621 Type 2 diabetes mellitus with foot ulcer 10/10/2016 Yes L97.312 Non-pressure chronic ulcer of right ankle with fat layer 10/10/2016 Yes exposed S80.211A Abrasion, right knee, initial encounter 01/27/2017 Yes N18.6 End stage renal disease 10/10/2016 Yes Z99.2 Dependence on renal dialysis 10/10/2016 Yes L97.512 Non-pressure chronic ulcer of other part of right foot with 02/13/2017 Yes fat layer exposed Inactive Problems Resolved Problems Electronic Signature(s) Signed: 03/06/2017 3:16:27 PM By: [name], [name] Entered By: [name] on 03/06/2017 09:41:58 Axley, Velita S. ([id number]) -------------------------------------------------------------------------------- Progress Note Details Patient Name: Imber, Loryn S. Date of Service: 03/06/2017 9:15 AM Medical Record Number: [id number] Patient Account Number: [id number] Date of Birth/Sex: 1956/04/19 (61 y.o. Female) Treating RN: Cornell Barman Primary Care Provider: [name] Other Clinician: Referring Provider: [name] Treating Provider/Extender: Frann Rider in Treatment: 21 Subjective Chief Complaint Information obtained from Patient She is here in follow up evaluation of right foot ulcers History of Present Illness (HPI) The following HPI elements were documented for the patient's wound: Location: right lateral ankle Quality: Patient reports No Pain. Severity: Patient states wound (s) are getting better. Duration: Patient has had the wound for > 1 months prior to seeking treatment at the wound center Context: The wound appeared gradually over time, and she thinks she might have hit it with a walker Modifying Factors: Patient is currently on renal dialysis and receives treatments 3 times weekly 61 year old patient known to our practice from several previous visits for various problems now comes back with an ulcerated area on her right lateral malleolus which she's had for  about a month and she thinks she may have hit it with her walker. No other problems and no x-ray of this has been taken. She has a past medical history of atrial fibrillation, carotid artery disease, CHF, chronic kidney disease on hemodialysis with a functioning AV fistula seen by Dr. Corene Cornea dew, diabetes mellitus with peripheral neuropathy, pancreatitis and is status post abdominal hysterectomy, AV fistula placement in June 2016, back surgery, cholecystectomy, coronary angioplasty,several surgeries for AV fistula, and dialysis catheters. She has never been a smoker. 10/17/2016 -- had a right ankle x-ray which showed no acute pathology of the right ankle. Arterial vascular calcifications are noted and there  is generalized osteopenia and demineralization 11/11/16 Patient appears to be doing fairly well with regard to her right ankle wound. She has been tolerating the dressing changes with the Santyl that does seem to have loosened up the slough a bit. She has also been tolerating the debridement's when they have been performed. She tells me that she does tend to lay on her right side and this seems to be aggravating the issue. She does not have a Prevalon boot for use at this facility. Fortunately she is not having significant pain. 01/06/17 on evaluation today patient's right lateral ankle wound appears to be doing fairly well. There was some Slough covering although there is no evidence of infection and she does not seem to have significant pain. She is pleased with how this is progressing compared to where it was in the beginning but wishes it would completely close. 01/20/17 on evaluation today patient's wound appears to be doing better. She did have some Slough covering the wound but overall there did not appear to be a significant amount of erythema and definitely no read Dry, Zonia S. ([id number]) streaking from the wound itself. She also did not have significant pain. 01/27/17 on evaluation  today patient appears to be doing okay although she unfortunately had a recent fall two days ago and during this fall she injured her right knee as well as her left lateral leg where there is a blister there. There is an abrasion over the knee. Unfortunately the knee has been giving her some discomfort and pain. Her right lateral ankle wound does appear to be doing a little bit better in my opinion in regard to the wound bed where I no longer visualize faster today but rather she does have pink granulation tissue. There is no evidence of infection. 02/13/2017 -- about 3 weeks ago she had had a fall and injured the medial part of her right foot which is now a full-fledged ulcerated area with slough. We have established this as a new wound today. 02/20/17 on evaluation today patient's wounds appeared to be doing about the same although the medial actually is slough covered. Fortunately or infection. She has no knowledge of, vomiting, diarrhea to be getting more significant issue such as sepsis and does not appear to have any pain. With that being said she does not appear to be as alert and oriented action has been in the past. This is obviously a mental shift compared to when I had previously seen her. Unfortunately her husband over the past week and I think this may be weighing heavy on her as well. ===== Old notes: 61 year old patient who is known to be a diabetic and has end-stage renal disease has had several comorbidities including coronary artery disease, hypertension, hyperlipidemia, pancreatitis, anemia, previous history of hysterectomy, cholecystectomy, left-sided salivary gland excision, bilateral cataract surgery,Peritoneal dialysis catheter, hemodialysis catheter. the area on the back has also been caused by instant pressure she used to sleep on a recliner all day and has significant kyphoscoliosis. As far as the wound on her right lower extremity she's not sure how this blister  occurred but she thought it has been there for about 2 weeks. No recent blood investigations available and no recent hemoglobin A1c. 12/30/2014 -- she is an assisted living facility but I believe the nurses that have not followed instructions as she had some cream applied on her back and there was a different dressing. Last week she's had a AV fistula placed on her left forearm.  01/13/2015 -- she has had some localized infection at the port site and she's been on doxycycline for this. 02/05/2015 - he has developed a small blister on her right lower extremity. 02/10/2015 -- she has developed another small blister on her right anterior chest wall in the area where she's had tape for her dialysis access. this may just be injury caused by a tape burn. She had a dermatology opinion and they have taken a biopsy of her skin. She also had a left brachial AV fistula placed this week. ========= 02/27/17- She is here in follow up; states her blood sugars have been low recently, in the 60s; she denies any fever chills or complications with her wounds. She states she is tolerating dressing changes. There is improvement in measurements. 03/06/2017 -- the patient has recently had the death of her husband and is a bit depressed but other than that she has been doing well with his dressing changes Knust, Sherrye S. ([id number]) Objective Constitutional Pulse regular. Respirations normal and unlabored. Afebrile. Vitals Time Taken: 9:15 AM, Height: 61 in, Weight: 160 lbs, BMI: 30.2, Pulse: 80 bpm, Respiratory Rate: 18 breaths/min, Blood Pressure: 118/48 mmHg, Pulse Oximetry: 100 %. General Notes: Patient arrives today with empty O2 tank. Patient's sats upon arrival are 85%. Eyes Nonicteric. Reactive to light. Ears, Nose, Mouth, and Throat Lips, teeth, and gums WNL.Marland Kitchen Moist mucosa without lesions. Neck supple and nontender. No palpable supraclavicular or cervical adenopathy. Normal sized without  goiter. Respiratory WNL. No retractions.. Cardiovascular Pedal Pulses WNL. No clubbing, cyanosis or edema. Lymphatic No adneopathy. No adenopathy. No adenopathy. Musculoskeletal Adexa without tenderness or enlargement.. Digits and nails w/o clubbing, cyanosis, infection, petechiae, ischemia, or inflammatory conditions.Marland Kitchen Psychiatric Judgement and insight Intact.. No evidence of depression, anxiety, or agitation.. General Notes: the lateral wound is very shallow and undermines a bit better at this stage is too small for application of Prisma and we will protect this with a bordered foam. The medial ankle wound has a lot of slough and this need sharp debridement with a #3 curet and will benefit from Santyl ointment locally Integumentary (Hair, Skin) No suspicious lesions. No crepitus or fluctuance. No peri-wound warmth or erythema. No masses.. Wound #10 status is Open. Original cause of wound was Trauma. The wound is located on the [place], [place] ([id number]) Right,Lateral Malleolus. The wound measures 0.1cm length x 0.1cm width x 0.2cm depth; 0.008cm^2 area and 0.002cm^3 volume. There is Fat Layer (Subcutaneous Tissue) Exposed exposed. There is no tunneling noted, however, there is undermining starting at 1:00 and ending at 1:00 with a maximum distance of 0.2cm. There is a medium amount of serous drainage noted. The wound margin is flat and intact. There is medium (34-66%) pale granulation within the wound bed. There is a small (1-33%) amount of necrotic tissue within the wound bed including Adherent Slough. The periwound skin appearance exhibited: Erythema. The periwound skin appearance did not exhibit: Callus, Crepitus, Excoriation, Induration, Rash, Scarring, Dry/Scaly, Maceration, Atrophie Blanche, Cyanosis, Ecchymosis, Hemosiderin Staining, Mottled, Pallor, Rubor. The surrounding wound skin color is noted with erythema which is circumferential. Periwound temperature was noted as No  Abnormality. The periwound has tenderness on palpation. Wound #13 status is Open. Original cause of wound was Gradually Appeared. The wound is located on the Right,Medial Foot. The wound measures 0.6cm length x 0.7cm width x 0.2cm depth; 0.33cm^2 area and 0.066cm^3 volume. There is Fat Layer (Subcutaneous Tissue) Exposed exposed. There is no tunneling or undermining noted. There is a medium amount  of serous drainage noted. The wound margin is distinct with the outline attached to the wound base. There is no granulation within the wound bed. There is a large (67- 100%) amount of necrotic tissue within the wound bed including Adherent Slough. The periwound skin appearance exhibited: Induration, Maceration, Rubor, Erythema. The surrounding wound skin color is noted with erythema which is circumferential. Periwound temperature was noted as No Abnormality. The periwound has tenderness on palpation. 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 There was a Skin/Subcutaneous Tissue Debridement ([id number]) debridement with total area of 0.42 sq cm performed by [name], MD. with the following instrument(s): Curette to remove Viable and Non-Viable tissue/material including Fibrin/Slough and Subcutaneous after achieving pain control using Other (lidocoane 4%). A time out was conducted at 09:35, prior to the start of the procedure. A Minimum amount Weissman, Tayleigh S. ([id number]) of  bleeding was controlled with Pressure. The procedure was tolerated well with a pain level of 2 throughout and a pain level of 2 following the procedure. Post Debridement Measurements: 0.6cm length x 0.7cm width x 0.3cm depth; 0.099cm^3 volume. Character of Wound/Ulcer Post Debridement requires further debridement. Severity of Tissue Post Debridement is: Fat layer exposed. Post procedure Diagnosis Wound #13: Same as Pre-Procedure Plan Wound Cleansing: Wound #10 Right,Lateral Malleolus: Clean wound with Normal Saline. Cleanse wound with mild soap and water May Shower, gently pat wound dry prior to applying new dressing. Wound #13 Right,Medial Foot: Clean wound with Normal Saline. Cleanse wound with mild soap and water May Shower, gently pat wound dry prior to applying new dressing. Anesthetic: Wound #10 Right,Lateral Malleolus: Topical Lidocaine 4% cream applied to wound bed prior to debridement - for clinic use Wound #13 Right,Medial Foot: Topical Lidocaine 4% cream applied to wound bed prior to debridement - for clinic use Skin Barriers/Peri-Wound Care: Wound #10 Right,Lateral Malleolus: Skin Prep Wound #13 Right,Medial Foot: Skin Prep Primary Wound Dressing: Wound #10 Right,Lateral Malleolus: Boardered Foam Dressing Wound #13 Right,Medial Foot: Santyl Ointment Boardered Foam Dressing Secondary Dressing: Wound #13 Right,Medial Foot: Boardered Foam Dressing Dressing Change Frequency: Wound #10 Right,Lateral Malleolus: Change Dressing Tuesday and Thursday. Wound #13 Right,Medial Foot: Change dressing every day. Follow-up Appointments: Wound #10 Right,Lateral Malleolus: Return Appointment in 1 week. AHMARI, GARTON ([id number]) Wound #13 Right,Medial Foot: Return Appointment in 1 week. Edema Control: Wound #10 Right,Lateral Malleolus: Patient to wear own compression stockings Elevate legs to the level of the heart and pump ankles as often as possible General Notes: O2  sent with patient after sharp debridement and review today I have recommended: 1. Santyl ointment locally to be applied daily with a bordered foam, the wound on her right medial foot. 2. bordered foam to the right lateral malleolus 3. Offloading has been discussed with her 4. adequate protein, vitamin A, vitamin C and zinc Electronic Signature(s) Signed: 03/06/2017 3:16:27 PM By: [name], [name] Entered By: [name] on 03/06/2017 10:00:58 Asper, [name] ([id number]) -------------------------------------------------------------------------------- SuperBill Details [name]. Date of Service: 03/06/2017 Medical Record  Number: [id number] Patient Account Number: [id number] Date of Birth/Sex: Apr 10, 1956 (61 y.o. 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 S80.211A Abrasion, right knee, initial encounter N18.6 End stage renal disease Z99.2 Dependence on renal dialysis L97.512 Non-pressure chronic ulcer of other part of right foot with fat layer exposed Facility Procedures CPT4 Code Description: [id number] 11042 - DEB SUBQ TISSUE 20 SQ CM/< ICD-10 Description Diagnosis E11.621 Type 2 diabetes mellitus with foot ulcer L97.312 Non-pressure chronic ulcer of right ankle with fat l L97.512 Non-pressure chronic ulcer of other part  of right fo Modifier: ayer exposed ot with fat la Quantity: 1 yer exposed Physician Procedures CPT4 Code Description: [id number] 11042 - WC PHYS SUBQ TISS 20 SQ CM ICD-10 Description Diagnosis E11.621 Type 2 diabetes mellitus with foot ulcer L97.312 Non-pressure chronic ulcer of right ankle with fat l L97.512 Non-pressure chronic ulcer of  other part  of right fo Modifier: ayer exposed ot with fat lay Quantity: 1 er exposed Electronic Signature(s) Signed: 03/06/2017 3:16:27 PM By: [name], [name] Entered By: [name] on 03/06/2017 10:07:01

## 2017-03-13 ENCOUNTER — Encounter: Payer: Medicare Other | Admitting: Surgery

## 2017-03-13 DIAGNOSIS — E11621 Type 2 diabetes mellitus with foot ulcer: Secondary | ICD-10-CM | POA: Diagnosis not present

## 2017-03-14 NOTE — Progress Notes (Signed)
PALLIE, SWIGERT ([id number]) Visit Report for 03/13/2017 Chief Complaint Document Details [name], [name]. Date of Service: 03/13/2017 9:15 AM Medical Record Number: [id number] Patient Account Number: [id number] Date of Birth/Sex: [birth year] (61 y.o. Female) Treating RN: Cornell Barman Primary Care Provider: [name] Other Clinician: Referring Provider: [name] Treating Provider/Extender: Frann Rider in Treatment: 22 Information Obtained from: Patient Chief Complaint She is here in follow up evaluation of right foot ulcers Electronic Signature(s) Signed: 03/13/2017 2:50:55 PM By: [name], [name] Entered By: [name] on 03/13/2017 09:48:16 Jackowski, [name] ([id number]) -------------------------------------------------------------------------------- Debridement Details [name]. Date of Service: 03/13/2017 9:15 AM Medical Record Number: [id number] Patient Account Number: [id number] Date of Birth/Sex: January 08, 1956 (61 y.o. Female) Treating RN: Cornell Barman Primary Care Provider: [name] Other Clinician: Referring Provider: [name] Treating Provider/Extender: Frann Rider in Treatment: 22 Debridement Performed for Wound #13 Right,Medial Foot Assessment: Performed By: Physician [name], MD Debridement: Debridement Severity of Tissue Pre Fat layer exposed Debridement: Pre-procedure Verification/Time Out Yes - 09:29 Taken: Start Time: 09:30 Pain Control: Other : Lidocaine 4% Level: Skin/Subcutaneous Tissue Total Area Debrided (L x 0.5 (cm) x 0.6 (cm) = 0.3 (cm) W): Tissue and other Viable, Non-Viable, Fibrin/Slough, Subcutaneous material debrided: Instrument: Curette Bleeding: Minimum Hemostasis Achieved: Pressure End Time: 09:31 Procedural Pain: 0 Post Procedural Pain: 0 Response to Treatment: Procedure was tolerated well Post Debridement Measurements of Total Wound Length: (cm) 0.5 Width:  (cm) 0.6 Depth: (cm) 0.3 Volume: (cm) 0.071 Character of Wound/Ulcer Post Improved Debridement: Severity of Tissue Post Debridement: Fat layer exposed Post Procedure Diagnosis Same as Pre-procedure Electronic Signature(s) Signed: 03/13/2017 2:50:55 PM By: [name], [name] Signed: 03/14/2017 7:55:48 AM By: Gretta Cool [name], RN, CWS, Kim RN, [name] [name], [name] ([id number]) Entered By: [name] on 03/13/2017 09:48:08 [name], [name] ([id number]) -------------------------------------------------------------------------------- HPI Details [name], [name]. Date of Service: 03/13/2017 9:15 AM Medical Record Number: [id number] Patient Account Number: [id number] Date of Birth/Sex: 03/16/1956 (61 y.o. Female) Treating RN: Cornell Barman Primary Care Provider: [name] Other Clinician: Referring Provider: [name] Treating Provider/Extender: Frann Rider in Treatment: 22 History of Present Illness Location: right lateral ankle Quality: Patient reports No Pain. Severity: Patient states wound (s) are getting better. Duration: Patient has had the wound for > 1 months prior to seeking treatment at the wound center Context: The wound appeared gradually over time, and she thinks she might have hit it with a walker Modifying Factors: Patient is currently on renal dialysis and receives treatments 3 times weekly HPI Description: 61 year old patient known to our practice from several previous visits for various problems now comes back with an ulcerated area on her right lateral malleolus which she's had for about a month and she thinks she may have hit it with her walker. No other problems and no x-ray of this has been taken. She has a past medical history of atrial fibrillation, carotid artery disease, CHF, chronic kidney disease on hemodialysis with a functioning AV fistula seen by Dr. Corene Cornea dew, diabetes mellitus with peripheral neuropathy, pancreatitis and is status  post abdominal hysterectomy, AV fistula placement in June 2016, back surgery, cholecystectomy, coronary angioplasty,several surgeries for AV fistula, and dialysis catheters. She has never been a smoker. 10/17/2016 -- had a right ankle x-ray which showed no acute pathology of the right ankle. Arterial vascular calcifications are noted and there is generalized osteopenia and demineralization 11/11/16 Patient appears to be doing fairly well with  regard to her right ankle wound. She has been tolerating the dressing changes with the Santyl that does seem to have loosened up the slough a bit. She has also been tolerating the debridement's when they have been performed. She tells me that she does tend to lay on her right side and this seems to be aggravating the issue. She does not have a Prevalon boot for use at this facility. Fortunately she is not having significant pain. 01/06/17 on evaluation today patient's right lateral ankle wound appears to be doing fairly well. There was some Slough covering although there is no evidence of infection and she does not seem to have significant pain. She is pleased with how this is progressing compared to where it was in the beginning but wishes it would completely close. 01/20/17 on evaluation today patient's wound appears to be doing better. She did have some Slough covering the wound but overall there did not appear to be a significant amount of erythema and definitely no read streaking from the wound itself. She also did not have significant pain. 01/27/17 on evaluation today patient appears to be doing okay although she unfortunately had a recent fall two days ago and during this fall she injured her right knee as well as her left lateral leg where there is a blister there. There is an abrasion over the knee. Unfortunately the knee has been giving her some discomfort and pain. Her right lateral ankle wound does appear to be doing a little bit better in my opinion  in regard to the wound bed where I no longer visualize faster today but rather she does have pink granulation [name], [name] ([id number]) tissue. There is no evidence of infection. 02/13/2017 -- about 3 weeks ago she had had a fall and injured the medial part of her right foot which is now a full-fledged ulcerated area with slough. We have established this as a new wound today. 02/20/17 on evaluation today patient's wounds appeared to be doing about the same although the medial actually is slough covered. Fortunately or infection. She has no knowledge of, vomiting, diarrhea to be getting more significant issue such as sepsis and does not appear to have any pain. With that being said she does not appear to be as alert and oriented action has been in the past. This is obviously a mental shift compared to when I had previously seen her. Unfortunately her husband over the past week and I think this may be weighing heavy on her as well. ===== Old notes: 60 year old patient who is known to be a diabetic and has end-stage renal disease has had several comorbidities including coronary artery disease, hypertension, hyperlipidemia, pancreatitis, anemia, previous history of hysterectomy, cholecystectomy, left-sided salivary gland excision, bilateral cataract surgery,Peritoneal dialysis catheter, hemodialysis catheter. the area on the back has also been caused by instant pressure she used to sleep on a recliner all day and has significant kyphoscoliosis. As far as the wound on her right lower extremity she's not sure how this blister occurred but she thought it has been there for about 2 weeks. No recent blood investigations available and no recent hemoglobin A1c. 12/30/2014 -- she is an assisted living facility but I believe the nurses that have not followed instructions as she had some cream applied on her back and there was a different dressing. Last week she's had a AV fistula placed on her left  forearm. 01/13/2015 -- she has had some localized infection at the port site and she's  been on doxycycline for this. 02/05/2015 - he has developed a small blister on her right lower extremity. 02/10/2015 -- she has developed another small blister on her right anterior chest wall in the area where she's had tape for her dialysis access. this may just be injury caused by a tape burn. She had a dermatology opinion and they have taken a biopsy of her skin. She also had a left brachial AV fistula placed this week. ========= 02/27/17- She is here in follow up; states her blood sugars have been low recently, in the 60s; she denies any fever chills or complications with her wounds. She states she is tolerating dressing changes. There is improvement in measurements. 03/06/2017 -- the patient has recently had the death of her husband and is a bit depressed but other than that she has been doing well with his dressing changes Electronic Signature(s) Signed: 03/13/2017 2:50:55 PM By: [name], [name] Entered By: [name] on 03/13/2017 09:48:35 [name], [name] ([id number]) -------------------------------------------------------------------------------- Physical Exam Details [name], 78. Date of Service: 03/13/2017 9:15 AM Medical Record Number: [id number] Patient Account Number: [id number] Date of Birth/Sex: Sep 09, 1955 (61 y.o. Female) Treating RN: Cornell Barman Primary Care Provider: [name] Other Clinician: Referring Provider: [name] Treating Provider/Extender: Frann Rider in Treatment: 22 Constitutional . Pulse regular. Respirations normal and unlabored. Afebrile. . Eyes Nonicteric. Reactive to light. Ears, Nose, Mouth, and Throat Lips, teeth, and gums WNL.Marland Kitchen Moist mucosa without lesions. Neck supple and nontender. No palpable supraclavicular or cervical adenopathy. Normal sized without goiter. Respiratory WNL. No retractions.. Cardiovascular Pedal  Pulses WNL. No clubbing, cyanosis or edema. Lymphatic No adneopathy. No adenopathy. No adenopathy. Musculoskeletal Adexa without tenderness or enlargement.. Digits and nails w/o clubbing, cyanosis, infection, petechiae, ischemia, or inflammatory conditions.. Integumentary (Hair, Skin) No suspicious lesions. No crepitus or fluctuance. No peri-wound warmth or erythema. No masses.Marland Kitchen Psychiatric Judgement and insight Intact.. No evidence of depression, anxiety, or agitation.. Notes the lateral wound continues to be minimally open and we will protect this with a bordered foam. Medial ankle wound has a lot of slough and this was sharply removed with a #3 curet and minimal bleeding controlled with pressure Electronic Signature(s) Signed: 03/13/2017 2:50:55 PM By: [name], [name] Entered By: [name] on 03/13/2017 09:49:12 [name], [name] ([id number]) -------------------------------------------------------------------------------- Physician Orders Details [name]. Date of Service: 03/13/2017 9:15 AM Medical Record Number: [id number] Patient Account Number: [id number] Date of Birth/Sex: [birth year] (61 y.o. Female) Treating RN: Cornell Barman Primary Care Provider: [name] Other Clinician: Referring Provider: [name] Treating Provider/Extender: Frann Rider in Treatment: 22 Verbal / Phone Orders: No Diagnosis Coding Wound Cleansing Wound #10 Right,Lateral Malleolus o Clean wound with Normal Saline. o Cleanse wound with mild soap and water o May Shower, gently pat wound dry prior to applying new dressing. Wound #13 Right,Medial Foot o Clean wound with Normal Saline. o Cleanse wound with mild soap and water o May Shower, gently pat wound dry prior to applying new dressing. Anesthetic Wound #10 Right,Lateral Malleolus o Topical Lidocaine 4% cream applied to wound bed prior to debridement - for clinic use Wound #13 Right,Medial  Foot o Topical Lidocaine 4% cream applied to wound bed prior to debridement - for clinic use Skin Barriers/Peri-Wound Care Wound #10 Right,Lateral Malleolus o Skin Prep Wound #13 Right,Medial Foot o Skin Prep Primary Wound Dressing Wound #10 Right,Lateral Malleolus o Boardered Foam Dressing Wound #13 Right,Medial Foot o Santyl Ointment Secondary Dressing Wound #  Springs Boardered Foam Dressing RON, JUNCO ([id number]) Dressing Change Frequency Wound #10 Right,Lateral Malleolus o Change Dressing Tuesday and Thursday. Wound #13 Right,Medial Foot o Change dressing every day. Follow-up Appointments Wound #10 Right,Lateral Malleolus o Return Appointment in 1 week. Wound #13 Right,Medial Foot o Return Appointment in 1 week. Edema Control Wound #10 Right,Lateral Malleolus o Patient to wear own compression stockings o Elevate legs to the level of the heart and pump ankles as often as possible Electronic Signature(s) Signed: 03/13/2017 2:50:55 PM By: [name], [name] Signed: 03/14/2017 7:55:48 AM By: Gretta Cool, [name], RN, CWS, Kim RN, [name] Entered By: Gretta Cool, [name], RN, CWS, Kim on 03/13/2017 09:32:41 [name], [name] ([id number]) -------------------------------------------------------------------------------- Problem List Details [name], [name]. Date of Service: 03/13/2017 9:15 AM Medical Record Number: [id number] Patient Account Number: [id number] Date of Birth/Sex: 31-Jan-1956 (61 y.o. Female) Treating RN: Cornell Barman Primary Care Provider: [name] Other Clinician: Referring Provider: [name] Treating Provider/Extender: Frann Rider in Treatment: 22 Active Problems ICD-10 Encounter Code Description Active Date Diagnosis E11.621 Type 2 diabetes mellitus with foot ulcer 10/10/2016 Yes L97.312 Non-pressure chronic ulcer of right ankle with fat layer 10/10/2016 Yes exposed S80.211A Abrasion, right knee, initial  encounter 01/27/2017 Yes N18.6 End stage renal disease 10/10/2016 Yes Z99.2 Dependence on renal dialysis 10/10/2016 Yes L97.512 Non-pressure chronic ulcer of other part of right foot with 02/13/2017 Yes fat layer exposed Inactive Problems Resolved Problems Electronic Signature(s) Signed: 03/13/2017 2:50:55 PM By: [name], [name] Entered By: [name] on 03/13/2017 09:47:49 Kaminsky, Maat S. ([id number]) -------------------------------------------------------------------------------- Progress Note Details [name], [name] Date of Service: 03/13/2017 9:15 AM Medical Record Number: [id number] Patient Account Number: [id number] Date of Birth/Sex: 09/22/1955 (61 y.o. Female) Treating RN: Cornell Barman Primary Care Provider: [name] Other Clinician: Referring Provider: [name] Treating Provider/Extender: Frann Rider in Treatment: 22 Subjective Chief Complaint Information obtained from Patient She is here in follow up evaluation of right foot ulcers History of Present Illness (HPI) The following HPI elements were documented for the patient's wound: Location: right lateral ankle Quality: Patient reports No Pain. Severity: Patient states wound (s) are getting better. Duration: Patient has had the wound for > 1 months prior to seeking treatment at the wound center Context: The wound appeared gradually over time, and she thinks she might have hit it with a walker Modifying Factors: Patient is currently on renal dialysis and receives treatments 3 times weekly 61 year old patient known to our practice from several previous visits for various problems now comes back with an ulcerated area on her right lateral malleolus which she's had for about a month and she thinks she may have hit it with her walker. No other problems and no x-ray of this has been taken. She has a past medical history of atrial fibrillation, carotid artery disease, CHF, chronic kidney disease  on hemodialysis with a functioning AV fistula seen by Dr. Corene Cornea dew, diabetes mellitus with peripheral neuropathy, pancreatitis and is status post abdominal hysterectomy, AV fistula placement in June 2016, back surgery, cholecystectomy, coronary angioplasty,several surgeries for AV fistula, and dialysis catheters. She has never been a smoker. 10/17/2016 -- had a right ankle x-ray which showed no acute pathology of the right ankle. Arterial vascular calcifications are noted and there is generalized osteopenia and demineralization 11/11/16 Patient appears to be doing fairly well with regard to her right ankle wound. She has been tolerating the dressing changes with the Santyl that does seem to  have loosened up the slough a bit. She has also been tolerating the debridement's when they have been performed. She tells me that she does tend to lay on her right side and this seems to be aggravating the issue. She does not have a Prevalon boot for use at this facility. Fortunately she is not having significant pain. 01/06/17 on evaluation today patient's right lateral ankle wound appears to be doing fairly well. There was some Slough covering although there is no evidence of infection and she does not seem to have significant pain. She is pleased with how this is progressing compared to where it was in the beginning but wishes it would completely close. 01/20/17 on evaluation today patient's wound appears to be doing better. She did have some Slough covering the wound but overall there did not appear to be a significant amount of erythema and definitely no read Steinhoff, Hikari S. ([id number]) streaking from the wound itself. She also did not have significant pain. 01/27/17 on evaluation today patient appears to be doing okay although she unfortunately had a recent fall two days ago and during this fall she injured her right knee as well as her left lateral leg where there is a blister there. There is an abrasion  over the knee. Unfortunately the knee has been giving her some discomfort and pain. Her right lateral ankle wound does appear to be doing a little bit better in my opinion in regard to the wound bed where I no longer visualize faster today but rather she does have pink granulation tissue. There is no evidence of infection. 02/13/2017 -- about 3 weeks ago she had had a fall and injured the medial part of her right foot which is now a full-fledged ulcerated area with slough. We have established this as a new wound today. 02/20/17 on evaluation today patient's wounds appeared to be doing about the same although the medial actually is slough covered. Fortunately or infection. She has no knowledge of, vomiting, diarrhea to be getting more significant issue such as sepsis and does not appear to have any pain. With that being said she does not appear to be as alert and oriented action has been in the past. This is obviously a mental shift compared to when I had previously seen her. Unfortunately her husband over the past week and I think this may be weighing heavy on her as well. ===== Old notes: 61 year old patient who is known to be a diabetic and has end-stage renal disease has had several comorbidities including coronary artery disease, hypertension, hyperlipidemia, pancreatitis, anemia, previous history of hysterectomy, cholecystectomy, left-sided salivary gland excision, bilateral cataract surgery,Peritoneal dialysis catheter, hemodialysis catheter. the area on the back has also been caused by instant pressure she used to sleep on a recliner all day and has significant kyphoscoliosis. As far as the wound on her right lower extremity she's not sure how this blister occurred but she thought it has been there for about 2 weeks. No recent blood investigations available and no recent hemoglobin A1c. 12/30/2014 -- she is an assisted living facility but I believe the nurses that have not followed  instructions as she had some cream applied on her back and there was a different dressing. Last week she's had a AV fistula placed on her left forearm. 01/13/2015 -- she has had some localized infection at the port site and she's been on doxycycline for this. 02/05/2015 - he has developed a small blister on her right lower extremity. 02/10/2015 --  she has developed another small blister on her right anterior chest wall in the area where she's had tape for her dialysis access. this may just be injury caused by a tape burn. She had a dermatology opinion and they have taken a biopsy of her skin. She also had a left brachial AV fistula placed this week. ========= 02/27/17- She is here in follow up; states her blood sugars have been low recently, in the 60s; she denies any fever chills or complications with her wounds. She states she is tolerating dressing changes. There is improvement in measurements. 03/06/2017 -- the patient has recently had the death of her husband and is a bit depressed but other than that she has been doing well with his dressing changes [name], [name] ([id number]) Objective Constitutional Pulse regular. Respirations normal and unlabored. Afebrile. Vitals Time Taken: 9:15 AM, Height: 61 in, Weight: 160 lbs, BMI: 30.2, Temperature: 97.8 F, Pulse: 81 bpm, Respiratory Rate: 18 breaths/min, Blood Pressure: 133/51 mmHg. General Notes: Patient on 2L O2. Eyes Nonicteric. Reactive to light. Ears, Nose, Mouth, and Throat Lips, teeth, and gums WNL.Marland Kitchen Moist mucosa without lesions. Neck supple and nontender. No palpable supraclavicular or cervical adenopathy. Normal sized without goiter. Respiratory WNL. No retractions.. Cardiovascular Pedal Pulses WNL. No clubbing, cyanosis or edema. Lymphatic No adneopathy. No adenopathy. No adenopathy. Musculoskeletal Adexa without tenderness or enlargement.. Digits and nails w/o clubbing, cyanosis, infection, petechiae, ischemia, or  inflammatory conditions.Marland Kitchen Psychiatric Judgement and insight Intact.. No evidence of depression, anxiety, or agitation.. General Notes: the lateral wound continues to be minimally open and we will protect this with a bordered foam. Medial ankle wound has a lot of slough and this was sharply removed with a #3 curet and minimal bleeding controlled with pressure Integumentary (Hair, Skin) No suspicious lesions. No crepitus or fluctuance. No peri-wound warmth or erythema. No masses.. Wound #10 status is Open. Original cause of wound was Trauma. The wound is located on the Leaman, Mio ([id number]) Right,Lateral Malleolus. The wound measures 0.1cm length x 0.1cm width x 0.1cm depth; 0.008cm^2 area and 0.001cm^3 volume. There is Fat Layer (Subcutaneous Tissue) Exposed exposed. There is no tunneling or undermining noted. There is a medium amount of serous drainage noted. The wound margin is indistinct and nonvisible. There is medium (34-66%) pale granulation within the wound bed. There is a small (1-33%) amount of necrotic tissue within the wound bed including Adherent Slough. The periwound skin appearance exhibited: Erythema. The periwound skin appearance did not exhibit: Callus, Crepitus, Excoriation, Induration, Rash, Scarring, Dry/Scaly, Maceration, Atrophie Blanche, Cyanosis, Ecchymosis, Hemosiderin Staining, Mottled, Pallor, Rubor. The surrounding wound skin color is noted with erythema which is circumferential. Periwound temperature was noted as No Abnormality. The periwound has tenderness on palpation. Wound #13 status is Open. Original cause of wound was Gradually Appeared. The wound is located on the Right,Medial Foot. The wound measures 0.5cm length x 0.6cm width x 0.2cm depth; 0.236cm^2 area and 0.047cm^3 volume. There is Fat Layer (Subcutaneous Tissue) Exposed exposed. There is no tunneling or undermining noted. There is a medium amount of serous drainage noted. The wound margin is  distinct with the outline attached to the wound base. There is no granulation within the wound bed. There is a large (67- 100%) amount of necrotic tissue within the wound bed including Adherent Slough. The periwound skin appearance exhibited: Induration, Maceration, Rubor, Erythema. The surrounding wound skin color is noted with erythema which is circumferential. Periwound temperature was noted as No Abnormality. The periwound has tenderness  on palpation. 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 There was a Skin/Subcutaneous Tissue Debridement ([id number]) debridement with total area of 0.3 sq cm performed by [name], MD. with the following instrument(s): Curette to remove Viable and Non-Viable tissue/material including Fibrin/Slough and Subcutaneous after achieving pain control using Other (Lidocaine 4%). A time out was conducted at 09:29, prior to the start of the procedure. A Minimum amount Kief, Liani S. ([id number]) of bleeding was controlled with Pressure. The procedure was tolerated well with a pain level of 0 throughout and a pain level of 0 following the procedure. Post Debridement Measurements: 0.5cm length x 0.6cm width x 0.3cm depth; 0.071cm^3 volume. Character of Wound/Ulcer Post Debridement is improved. Severity of Tissue Post Debridement is: Fat layer exposed. Post procedure Diagnosis Wound #13: Same as Pre-Procedure Plan Wound Cleansing: Wound  #10 Right,Lateral Malleolus: Clean wound with Normal Saline. Cleanse wound with mild soap and water May Shower, gently pat wound dry prior to applying new dressing. Wound #13 Right,Medial Foot: Clean wound with Normal Saline. Cleanse wound with mild soap and water May Shower, gently pat wound dry prior to applying new dressing. Anesthetic: Wound #10 Right,Lateral Malleolus: Topical Lidocaine 4% cream applied to wound bed prior to debridement - for clinic use Wound #13 Right,Medial Foot: Topical Lidocaine 4% cream applied to wound bed prior to debridement - for clinic use Skin Barriers/Peri-Wound Care: Wound #10 Right,Lateral Malleolus: Skin Prep Wound #13 Right,Medial Foot: Skin Prep Primary Wound Dressing: Wound #10 Right,Lateral Malleolus: Boardered Foam Dressing Wound #13 Right,Medial Foot: Santyl Ointment Secondary Dressing: Wound #13 Right,Medial Foot: Boardered Foam Dressing Dressing Change Frequency: Wound #10 Right,Lateral Malleolus: Change Dressing Tuesday and Thursday. Wound #13 Right,Medial Foot: Change dressing every day. Follow-up Appointments: Wound #10 Right,Lateral Malleolus: Return Appointment in 1 week. Wound #13 Right,Medial Foot: Lechuga, CALYSE MURCIA. ([id number]) Return Appointment in 1 week. Edema Control: Wound #10 Right,Lateral Malleolus: Patient to wear own compression stockings Elevate legs to the level of the heart and pump ankles as often as possible she is doing better overall and after sharp debridement and review today I have recommended: 1. Santyl ointment locally to be applied daily with a bordered foam, the wound on her right medial foot. 2. bordered foam to the right lateral malleolus 3. Offloading has been discussed with her 4. adequate protein, vitamin A, vitamin C and zinc Electronic Signature(s) Signed: 03/13/2017 2:50:55 PM By: [name], [name] Entered By: [name] on 03/13/2017 09:49:56 Wilford, [name]  ([id number]) -------------------------------------------------------------------------------- SuperBill Details [name]. Date of Service: 03/13/2017 Medical Record Number: [id number] Patient Account Number: [id number] Date of Birth/Sex: [birth year] (61 y.o. 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 S80.211A Abrasion, right knee, initial encounter N18.6 End stage renal disease Z99.2 Dependence on renal dialysis L97.512  Non-pressure chronic ulcer of other part of right foot with fat layer exposed Facility Procedures CPT4 Code Description: 70623762 11042 - DEB SUBQ TISSUE 20 SQ CM/< ICD-10 Description Diagnosis E11.621 Type 2 diabetes mellitus with foot ulcer L97.312 Non-pressure chronic ulcer of right ankle with fat l S80.211A Abrasion, right knee, initial encounter  L97.512 Non-pressure chronic ulcer of other part of right fo Modifier: ayer exposed ot with fat la Quantity: 1 yer exposed Physician Procedures CPT4 Code Description: 8315176 11042 - WC PHYS SUBQ TISS 20 SQ CM ICD-10 Description Diagnosis E11.621 Type 2 diabetes mellitus with foot ulcer L97.312 Non-pressure chronic ulcer of right ankle with fat l S80.211A Abrasion, right knee, initial encounter  L97.512 Non-pressure chronic ulcer of other part of right fo Modifier: ayer exposed ot with fat lay Quantity: 1 er exposed Electronic Signature(s) Signed: 03/13/2017 2:50:55 PM By: [name], [name] Entered By: [name] on 03/13/2017 09:50:11

## 2017-03-14 NOTE — Progress Notes (Signed)
CARMITA, BOOM ([id number]) Visit Report for 03/13/2017 Arrival Information Details [name], [name]. Date of Service: 03/13/2017 9:15 AM Medical Record Number: [id number] Patient Account Number: [id number] Date of Birth/Sex: [birth year] (61 y.o. Female) Treating RN: Cornell Barman Primary Care Mercedees Convery: [name] Other Clinician: Referring Loic Hobin: [name] Treating Nena Hampe/Extender: Frann Rider in Treatment: 22 Visit Information History Since Last Visit Added or deleted any medications: No Patient Arrived: Wheel Chair Any new allergies or adverse reactions: No Arrival Time: 09:14 Had a fall or experienced change in No Accompanied By: self activities of daily living that may affect Transfer Assistance: None risk of falls: Patient Identification Verified: Yes Signs or symptoms of abuse/neglect since last No Secondary Verification Process Yes visito Completed: Hospitalized since last visit: No Patient Requires Transmission- No Has Dressing in Place as Prescribed: Yes Based Precautions: Pain Present Now: No Patient Has Alerts: Yes Patient Alerts: Patient on Blood Thinner Electronic Signature(s) Signed: 03/14/2017 7:55:48 AM By: Gretta Cool, [name], RN, CWS, Kim RN, [name] Entered By: Gretta Cool, [name], RN, CWS, Kim on 03/13/2017 09:15:23 [place], [name] ([id number]) -------------------------------------------------------------------------------- Encounter Discharge Information Details [name], [name]. Date of Service: 03/13/2017 9:15 AM Medical Record Number: [id number] Patient Account Number: [id number] Date of Birth/Sex: 1955/09/04 (61 y.o. Female) Treating RN: Cornell Barman Primary Care Deja Kaigler: [name] Other Clinician: Referring Relda Agosto: [name] Treating Roxy Mastandrea/Extender: Frann Rider in Treatment: 29 Encounter Discharge Information Items Discharge Pain Level: 0 Discharge Condition: Stable Ambulatory Status: Wheelchair Discharge  Destination: Nursing Home Transportation: Private Auto Accompanied By: transportation Schedule Follow-up Appointment: Yes Medication Reconciliation completed and provided to Patient/Care Yes Kaelea Gathright: Provided on Clinical Summary of Care: 03/13/2017 Form Type Recipient Paper Patient LD Electronic Signature(s) Signed: 03/14/2017 7:55:48 AM By: Gretta Cool, [name], RN, CWS, Kim RN, [name] Entered By: Gretta Cool, [name], RN, CWS, Kim on 03/13/2017 09:40:32 Gren, [name] ([id number]) -------------------------------------------------------------------------------- Lower Extremity Assessment Details [name], [name] Date of Service: 03/13/2017 9:15 AM Medical Record Number: [id number] Patient Account Number: [id number] Date of Birth/Sex: 01/11/1956 (61 y.o. Female) Treating RN: Cornell Barman Primary Care Maurice Ramseur: [name] Other Clinician: Referring Dhairya Corales: [name] Treating Adell Panek/Extender: Frann Rider in Treatment: 22 Vascular Assessment Pulses: Dorsalis Pedis Palpable: [Right:Yes] Posterior Tibial Extremity colors, hair growth, and conditions: Extremity Color: [Right:Normal] Hair Growth on Extremity: [Right:No] Temperature of Extremity: [Right:Warm] Capillary Refill: [Right:< 3 seconds] Toe Nail Assessment Left: Right: Thick: No Discolored: No Deformed: No Improper Length and Hygiene: No Electronic Signature(s) Signed: 03/14/2017 7:55:48 AM By: Gretta Cool, [name], RN, CWS, Kim RN, [name] Entered By: Gretta Cool, [name], RN, CWS, Kim on 03/13/2017 09:23:55 Clemon, [name] ([id number]) -------------------------------------------------------------------------------- Multi Wound Chart Details Patient Name: [name] Cornwall. Date of Service: 03/13/2017 9:15 AM Medical Record Number: [id number] Patient Account Number: [id number] Date of Birth/Sex: 1955/08/28 (61 y.o. Female) Treating RN: Cornell Barman Primary Care Quaneshia Wareing: [name] Other Clinician: Referring Jamison Soward: [name] Treating Keveon Amsler/Extender: Frann Rider in Treatment: 22 Vital Signs Height(in): 61 Pulse(bpm): 81 Weight(lbs): 160 Blood Pressure 133/51 (mmHg): Body Mass Index(BMI): 30 Temperature(F): 97.8 Respiratory Rate 18 (breaths/min): Photos: [N/A:N/A] Wound Location: Right Malleolus - LateralRight Foot - Medial N/A Wounding Event: Trauma Gradually Appeared N/A Primary Etiology: Trauma, Other Diabetic Wound/Ulcer of N/A the Lower Extremity Comorbid History: Glaucoma, Anemia, Glaucoma, Anemia, N/A Coronary Artery Disease, Coronary Artery Disease, Hypertension, Type II Hypertension, Type II Diabetes, End Stage Diabetes, End Stage Renal Disease, Renal Disease, Osteoarthritis, Neuropathy Osteoarthritis, Neuropathy Date Acquired: 09/12/2016 12/14/2016 N/A Weeks of Treatment: 22 4  N/A Wound Status: Open Open N/A Measurements L x W x D 0.1x0.1x0.1 0.5x0.6x0.2 N/A (cm) Area (cm) : 0.008 0.236 N/A Volume (cm) : 0.001 0.047 N/A % Reduction in Area: 97.20% 66.60% N/A % Reduction in Volume: 98.20% 66.70% N/A Classification: Full Thickness Without Grade 1 N/A Exposed Support Structures Exudate Amount: Medium Medium N/A Exudate Type: Serous Serous N/A Exudate Color: amber amber N/A Byrum, Cassady S. ([id number]) Wound Margin: Indistinct, nonvisible Distinct, outline attached N/A Granulation Amount: Medium (34-66%) None Present (0%) N/A Granulation Quality: Pale N/A N/A Necrotic Amount: Small (1-33%) Large (67-100%) N/A Exposed Structures: Fat Layer (Subcutaneous Fat Layer (Subcutaneous N/A Tissue) Exposed: Yes Tissue) Exposed: Yes Fascia: No Tendon: No Muscle: No Joint: No Bone: No Epithelialization: Small (1-33%) None N/A Debridement: N/A Debridement ([id number] N/A 11047) Pre-procedure N/A 09:29 N/A Verification/Time Out Taken: Pain Control: N/A Other N/A Tissue Debrided: N/A Fibrin/Slough, N/A Subcutaneous Level: N/A Skin/Subcutaneous N/A Tissue Debridement  Area (sq N/A 0.3 N/A cm): Instrument: N/A Curette N/A Bleeding: N/A Minimum N/A Hemostasis Achieved: N/A Pressure N/A Procedural Pain: N/A 0 N/A Post Procedural Pain: N/A 0 N/A Debridement Treatment N/A Procedure was tolerated N/A Response: well Post Debridement N/A 0.5x0.6x0.3 N/A Measurements L x W x D (cm) Post Debridement N/A 0.071 N/A Volume: (cm) Periwound Skin Texture: Excoriation: No Induration: Yes N/A Induration: No Callus: No Crepitus: No Rash: No Scarring: No Periwound Skin Maceration: No Maceration: Yes N/A Moisture: Dry/Scaly: No Periwound Skin Color: Erythema: Yes Erythema: Yes N/A Atrophie Blanche: No Rubor: Yes Cyanosis: No Ecchymosis: No Hemosiderin Staining: No Mottled: No Griffo, Sugar S. ([id number]) Pallor: No Rubor: No Erythema Location: Circumferential Circumferential N/A Temperature: No Abnormality No Abnormality N/A Tenderness on Yes Yes N/A Palpation: Wound Preparation: Ulcer Cleansing: Ulcer Cleansing: N/A Rinsed/Irrigated with Rinsed/Irrigated with Saline Saline Topical Anesthetic Topical Anesthetic Applied: Other: lidocaine Applied: Other: lidocaine 4% 4% Procedures Performed: N/A Debridement N/A Treatment Notes Wound #10 (Right, Lateral Malleolus) 1. Cleansed with: Clean wound with Normal Saline 2. Anesthetic Topical Lidocaine 4% cream to wound bed prior to debridement 4. Dressing Applied: Santyl Ointment 5. Secondary Dressing Applied Bordered Foam Dressing 7. Secured with Paper tape Notes BFD only on lateral malleolus. Wound #13 (Right, Medial Foot) 1. Cleansed with: Clean wound with Normal Saline 2. Anesthetic Topical Lidocaine 4% cream to wound bed prior to debridement 4. Dressing Applied: Santyl Ointment 5. Secondary Dressing Applied Bordered Foam Dressing 7. Secured with Paper tape Notes BFD only on lateral malleolus. Electronic Signature(s) KARMYN, LOWMAN ([id number]) Signed: 03/13/2017 2:50:55 PM By:  [name], [name] Entered By: [name] on 03/13/2017 09:47:59 Bejarano, [name] ([id number]) -------------------------------------------------------------------------------- [place] Details [name], [name]. Date of Service: 03/13/2017 9:15 AM Medical Record Number: [id number] Patient Account Number: [id number] Date of Birth/Sex: 1956-03-17 (61 y.o. Female) Treating RN: Cornell Barman Primary Care Kaelan Emami: [name] Other Clinician: Referring Candus Braud: [name] Treating Safiatou Islam/Extender: Frann Rider in Treatment: 22 Active Inactive ` Abuse / Safety / Falls / Self Care Management Nursing Diagnoses: Potential for falls Goals: Patient will remain injury free Date Initiated: 10/10/2016 Target Resolution Date: 12/23/2016 Goal Status: Active Interventions: Assess fall risk on admission and as needed Notes: ` Nutrition Nursing Diagnoses: Potential for alteratiion in Nutrition/Potential for imbalanced nutrition Goals: Patient/caregiver agrees to and verbalizes understanding of need to use nutritional supplements and/or vitamins as prescribed Date Initiated: 10/10/2016 Target Resolution Date: 10/21/2016 Goal Status: Active Interventions: Assess patient nutrition upon admission and as needed per policy Notes: ` Orientation to the  Wound Care Program Nursing Diagnoses: Knowledge deficit related to the wound healing center program Napierkowski, CHERRIL HETT ([id number]) Goals: Patient/caregiver will verbalize understanding of the [place] Date Initiated: 10/10/2016 Target Resolution Date: 12/23/2016 Goal Status: Active Interventions: Provide education on orientation to the wound center Notes: ` Wound/Skin Impairment Nursing Diagnoses: Impaired tissue integrity Goals: Patient/caregiver will verbalize understanding of skin care regimen Date Initiated: 10/10/2016 Target Resolution Date: 12/23/2016 Goal Status:  Active Ulcer/skin breakdown will have a volume reduction of 30% by week 4 Date Initiated: 10/10/2016 Target Resolution Date: 12/23/2016 Goal Status: Active Ulcer/skin breakdown will have a volume reduction of 50% by week 8 Date Initiated: 10/10/2016 Target Resolution Date: 12/23/2016 Goal Status: Active Ulcer/skin breakdown will have a volume reduction of 80% by week 12 Date Initiated: 10/10/2016 Target Resolution Date: 12/23/2016 Goal Status: Active Ulcer/skin breakdown will heal within 14 weeks Date Initiated: 10/10/2016 Target Resolution Date: 12/23/2016 Goal Status: Active Interventions: Assess patient/caregiver ability to obtain necessary supplies Assess patient/caregiver ability to perform ulcer/skin care regimen upon admission and as needed Assess ulceration(s) every visit Notes: Electronic Signature(s) Signed: 03/14/2017 7:55:48 AM By: Gretta Cool, [name], RN, CWS, Kim RN, [name] Entered By: Gretta Cool, [name], RN, CWS, Kim on 03/13/2017 09:24:25 Cuda, [name] ([id number]) -------------------------------------------------------------------------------- Pain Assessment Details Patient Name: [name] Cornwall. Date of Service: 03/13/2017 9:15 AM Medical Record Number: [id number] Patient Account Number: [id number] Date of Birth/Sex: 1955/12/21 (61 y.o. Female) Treating RN: Cornell Barman Primary Care Wayburn Shaler: [name] Other Clinician: Referring Zakariyya Helfman: [name] Treating Lenoria Narine/Extender: Frann Rider in Treatment: 22 Active Problems Location of Pain Severity and Description of Pain Patient Has Paino No Site Locations With Dressing Change: No Pain Management and Medication Current Pain Management: Goals for Pain Management Topical or injectable lidocaine is offered to patient for acute pain when surgical debridement is performed. If needed, Patient is instructed to use over the counter pain medication for the following 24-48 hours after debridement. Wound care MDs do not prescribed  pain medications. Patient has chronic pain or uncontrolled pain. Patient has been instructed to make an appointment with their Primary Care Physician for pain management. Electronic Signature(s) Signed: 03/14/2017 7:55:48 AM By: Gretta Cool, [name], RN, CWS, Kim RN, [name] Entered By: Gretta Cool, [name], RN, CWS, Kim on 03/13/2017 09:15:38 [name], [name] ([id number]) -------------------------------------------------------------------------------- Patient/Caregiver Education Details Patient Name: [name] Cornwall. Date of Service: 03/13/2017 9:15 AM Medical Record Number: [id number] Patient Account Number: [id number] Date of Birth/Gender: 04/24/1956 (61 y.o. Female) Treating RN: Cornell Barman Primary Care Physician: [name] Other Clinician: Referring Physician: [name] Treating Physician/Extender: Frann Rider in Treatment: 22 Education Assessment Education Provided To: Patient Education Topics Provided Wound/Skin Impairment: Handouts: Caring for Your Ulcer, Other: SNF notes sent with patient Methods: Demonstration Responses: State content correctly Electronic Signature(s) Signed: 03/14/2017 7:55:48 AM By: Gretta Cool, [name], RN, CWS, Kim RN, [name] Entered By: Gretta Cool, [name], RN, CWS, Kim on 03/13/2017 09:40:51 Kittle, [name] ([id number]) -------------------------------------------------------------------------------- Wound Assessment Details [name], [name]. Date of Service: 03/13/2017 9:15 AM Medical Record Number: [id number] Patient Account Number: [id number] Date of Birth/Sex: [birth year] (61 y.o. Female) Treating RN: Cornell Barman Primary Care Zaveon Gillen: [name] Other Clinician: Referring Bader Stubblefield: [name] Treating Macala Baldonado/Extender: Frann Rider in Treatment: 22 Wound Status Wound Number: 10 Primary Trauma, Other Etiology: Wound Location: Right Malleolus - Lateral Wound Open Wounding Event: Trauma Status: Date Acquired: 09/12/2016 Comorbid Glaucoma, Anemia,  Coronary Artery Weeks Of Treatment: 22 History: Disease, Hypertension, Type II Clustered Wound: No Diabetes, End Stage  Renal Disease, Osteoarthritis, Neuropathy Photos Wound Measurements Length: (cm) 0.1 Width: (cm) 0.1 Depth: (cm) 0.1 Area: (cm) 0.008 Volume: (cm) 0.001 % Reduction in Area: 97.2% % Reduction in Volume: 98.2% Epithelialization: Small (1-33%) Tunneling: No Undermining: No Wound Description Full Thickness Without Exposed Classification: Support Structures Wound Margin: Indistinct, nonvisible Exudate Medium Amount: Exudate Type: Serous Exudate Color: amber Foul Odor After Cleansing: No Slough/Fibrino Yes Wound Bed Granulation Amount: Medium (34-66%) Exposed Structure Granulation Quality: Pale Fascia Exposed: No Necrotic Amount: Small (1-33%) Fat Layer (Subcutaneous Tissue) Exposed: Yes Necrotic Quality: Adherent Slough Tendon Exposed: No Mcbrearty, Anabela S. ([id number]) Muscle Exposed: No Joint Exposed: No Bone Exposed: No Periwound Skin Texture Texture Color No Abnormalities Noted: No No Abnormalities Noted: No Callus: No Atrophie Blanche: No Crepitus: No Cyanosis: No Excoriation: No Ecchymosis: No Induration: No Erythema: Yes Rash: No Erythema Location: Circumferential Scarring: No Hemosiderin Staining: No Mottled: No Moisture Pallor: No No Abnormalities Noted: No Rubor: No Dry / Scaly: No Maceration: No Temperature / Pain Temperature: No Abnormality Tenderness on Palpation: Yes Wound Preparation Ulcer Cleansing: Rinsed/Irrigated with Saline Topical Anesthetic Applied: Other: lidocaine 4%, Treatment Notes Wound #10 (Right, Lateral Malleolus) 1. Cleansed with: Clean wound with Normal Saline 2. Anesthetic Topical Lidocaine 4% cream to wound bed prior to debridement 4. Dressing Applied: Santyl Ointment 5. Secondary Dressing Applied Bordered Foam Dressing 7. Secured with Paper tape Notes BFD only on lateral  malleolus. Electronic Signature(s) Signed: 03/14/2017 7:55:48 AM By: Gretta Cool, [name], RN, CWS, Kim RN, [name] Entered By: Gretta Cool, [name], RN, CWS, Kim on 03/13/2017 09:22:06 [name], [name] ([id number]) -------------------------------------------------------------------------------- Wound Assessment Details [name], KIMMONS. Date of Service: 03/13/2017 9:15 AM Medical Record Number: [id number] Patient Account Number: [id number] Date of Birth/Sex: 1956-05-14 (61 y.o. Female) Treating RN: Cornell Barman Primary Care Wylie Coon: [name] Other Clinician: Referring Kaylia Winborne: [name] Treating Randy Castrejon/Extender: Frann Rider in Treatment: 22 Wound Status Wound Number: 13 Primary Diabetic Wound/Ulcer of the Lower Etiology: Extremity Wound Location: Right Foot - Medial Wound Open Wounding Event: Gradually Appeared Status: Date Acquired: 12/14/2016 Comorbid Glaucoma, Anemia, Coronary Artery Weeks Of Treatment: 4 History: Disease, Hypertension, Type II Clustered Wound: No Diabetes, End Stage Renal Disease, Osteoarthritis, Neuropathy Photos Wound Measurements Length: (cm) 0.5 Width: (cm) 0.6 Depth: (cm) 0.2 Area: (cm) 0.236 Volume: (cm) 0.047 % Reduction in Area: 66.6% % Reduction in Volume: 66.7% Epithelialization: None Tunneling: No Undermining: No Wound Description Classification: Grade 1 Foul Odor Aft Wound Margin: Distinct, outline attached Slough/Fibrin Exudate Amount: Medium Exudate Type: Serous Exudate Color: amber er Cleansing: No o Yes Wound Bed Granulation Amount: None Present (0%) Exposed Structure Necrotic Amount: Large (67-100%) Fat Layer (Subcutaneous Tissue) Exposed: Yes Necrotic Quality: Adherent Slough Periwound Skin Texture Texture Color Sorto, Quaneisha S. ([id number]) No Abnormalities Noted: No No Abnormalities Noted: No Induration: Yes Erythema: Yes Erythema Location: Circumferential Moisture Rubor: Yes No Abnormalities Noted:  No Maceration: Yes Temperature / Pain Temperature: No Abnormality Tenderness on Palpation: Yes Wound Preparation Ulcer Cleansing: Rinsed/Irrigated with Saline Topical Anesthetic Applied: Other: lidocaine 4%, Treatment Notes Wound #13 (Right, Medial Foot) 1. Cleansed with: Clean wound with Normal Saline 2. Anesthetic Topical Lidocaine 4% cream to wound bed prior to debridement 4. Dressing Applied: Santyl Ointment 5. Secondary Dressing Applied Bordered Foam Dressing 7. Secured with Paper tape Notes BFD only on lateral malleolus. Electronic Signature(s) Signed: 03/14/2017 7:55:48 AM By: Gretta Cool, [name], RN, CWS, Kim RN, [name] Entered By: Gretta Cool, [name], RN, CWS, Kim on 03/13/2017 09:23:16 Digman, [name] ([id number]) -------------------------------------------------------------------------------- Vitals Details  [name], [name]. Date of Service: 03/13/2017 9:15 AM Medical Record Number: [id number] Patient Account Number: [id number] Date of Birth/Sex: Oct 13, 1955 (61 y.o. Female) Treating RN: Cornell Barman Primary Care Richele Strand: [name] Other Clinician: Referring Nathaly Dawkins: [name] Treating Jonique Kulig/Extender: Frann Rider in Treatment: 22 Vital Signs Time Taken: 09:15 Temperature (F): 97.8 Height (in): 61 Pulse (bpm): 81 Weight (lbs): 160 Respiratory Rate (breaths/min): 18 Body Mass Index (BMI): 30.2 Blood Pressure (mmHg): 133/51 Reference Range: 80 - 120 mg / dl Notes Patient on 2L O2. Electronic Signature(s) Signed: 03/14/2017 7:55:48 AM By: Gretta Cool, [name], RN, CWS, Kim RN, [name] Entered By: Gretta Cool, [name], RN, CWS, Kim on 03/13/2017 09:16:35

## 2017-03-24 ENCOUNTER — Encounter: Payer: Medicare Other | Attending: Surgery | Admitting: Surgery

## 2017-03-24 DIAGNOSIS — I509 Heart failure, unspecified: Secondary | ICD-10-CM | POA: Insufficient documentation

## 2017-03-24 DIAGNOSIS — I132 Hypertensive heart and chronic kidney disease with heart failure and with stage 5 chronic kidney disease, or end stage renal disease: Secondary | ICD-10-CM | POA: Insufficient documentation

## 2017-03-24 DIAGNOSIS — E1122 Type 2 diabetes mellitus with diabetic chronic kidney disease: Secondary | ICD-10-CM | POA: Insufficient documentation

## 2017-03-24 DIAGNOSIS — L97312 Non-pressure chronic ulcer of right ankle with fat layer exposed: Secondary | ICD-10-CM | POA: Insufficient documentation

## 2017-03-24 DIAGNOSIS — Z992 Dependence on renal dialysis: Secondary | ICD-10-CM | POA: Insufficient documentation

## 2017-03-24 DIAGNOSIS — I2510 Atherosclerotic heart disease of native coronary artery without angina pectoris: Secondary | ICD-10-CM | POA: Insufficient documentation

## 2017-03-24 DIAGNOSIS — N186 End stage renal disease: Secondary | ICD-10-CM | POA: Diagnosis not present

## 2017-03-24 DIAGNOSIS — Z9861 Coronary angioplasty status: Secondary | ICD-10-CM | POA: Diagnosis not present

## 2017-03-24 DIAGNOSIS — E1140 Type 2 diabetes mellitus with diabetic neuropathy, unspecified: Secondary | ICD-10-CM | POA: Diagnosis not present

## 2017-03-24 DIAGNOSIS — E11621 Type 2 diabetes mellitus with foot ulcer: Secondary | ICD-10-CM | POA: Diagnosis present

## 2017-03-24 DIAGNOSIS — I4891 Unspecified atrial fibrillation: Secondary | ICD-10-CM | POA: Diagnosis not present

## 2017-03-24 DIAGNOSIS — L97512 Non-pressure chronic ulcer of other part of right foot with fat layer exposed: Secondary | ICD-10-CM | POA: Diagnosis not present

## 2017-03-26 NOTE — Progress Notes (Signed)
[name], [name] ([id number]) Visit Report for 03/24/2017 Chief Complaint Document Details [name], [name]. Date of Service: 03/24/2017 9:15 AM Medical Record Number: [id number] Patient Account Number: [id number] Date of Birth/Sex: Dec 22, 1955 (61 y.o. Female) Treating RN: Cornell Barman Primary Care Provider: [name] Other Clinician: Referring Provider: [name] Treating Provider/Extender: Frann Rider in Treatment: 23 Information Obtained from: Patient Chief Complaint She is here in follow up evaluation of right foot ulcers Electronic Signature(s) Signed: 03/24/2017 3:48:14 PM By: [name], [name] Entered By: [name] on 03/24/2017 09:36:03 [name], [name] ([id number]) -------------------------------------------------------------------------------- Debridement Details [name]. Date of Service: 03/24/2017 9:15 AM Medical Record Number: [id number] Patient Account Number: [id number] Date of Birth/Sex: Aug 29, 1955 (61 y.o. Female) Treating RN: Cornell Barman Primary Care Provider: [name] Other Clinician: Referring Provider: [name] Treating Provider/Extender: Frann Rider in Treatment: 23 Debridement Performed for Wound #13 Right,Medial Foot Assessment: Performed By: Physician [name], MD Debridement: Debridement Severity of Tissue Pre Fat layer exposed Debridement: Pre-procedure Verification/Time Out Yes - 09:29 Taken: Start Time: 09:30 Pain Control: Other : lidocaine 4% Level: Skin/Subcutaneous Tissue Total Area Debrided (L x 0.5 (cm) x 0.5 (cm) = 0.25 (cm) W): Tissue and other Viable, Non-Viable, Exudate, Subcutaneous material debrided: Instrument: Curette Bleeding: Minimum Hemostasis Achieved: Pressure End Time: 09:31 Procedural Pain: 0 Post Procedural Pain: 0 Response to Treatment: Procedure was tolerated well Post Debridement Measurements of Total Wound Length: (cm) 0.5 Width: (cm)  0.5 Depth: (cm) 0.2 Volume: (cm) 0.039 Character of Wound/Ulcer Post Requires Further Debridement Debridement: Severity of Tissue Post Debridement: Fat layer exposed Post Procedure Diagnosis Same as Pre-procedure Electronic Signature(s) Signed: 03/24/2017 3:48:14 PM By: [name], [name] Signed: 03/24/2017 4:54:08 PM By: Gretta Cool [name], RN, CWS, Kim RN, [name] Alcon, Ariona S. ([id number]) Entered By: [name] on 03/24/2017 09:35:56 [name], [name] ([id number]) -------------------------------------------------------------------------------- HPI Details [name], [name]. Date of Service: 03/24/2017 9:15 AM Medical Record Number: [id number] Patient Account Number: [id number] Date of Birth/Sex: [birth year] (61 y.o. Female) Treating RN: Cornell Barman Primary Care Provider: [name] Other Clinician: Referring Provider: [name] Treating Provider/Extender: Frann Rider in Treatment: 23 History of Present Illness Location: right lateral ankle Quality: Patient reports No Pain. Severity: Patient states wound (s) are getting better. Duration: Patient has had the wound for > 1 months prior to seeking treatment at the wound center Context: The wound appeared gradually over time, and she thinks she might have hit it with a walker Modifying Factors: Patient is currently on renal dialysis and receives treatments 3 times weekly HPI Description: 62 year old patient known to our practice from several previous visits for various problems now comes back with an ulcerated area on her right lateral malleolus which she's had for about a month and she thinks she may have hit it with her walker. No other problems and no x-ray of this has been taken. She has a past medical history of atrial fibrillation, carotid artery disease, CHF, chronic kidney disease on hemodialysis with a functioning AV fistula seen by Dr. Corene Cornea dew, diabetes mellitus with peripheral neuropathy, pancreatitis  and is status post abdominal hysterectomy, AV fistula placement in June 2016, back surgery, cholecystectomy, coronary angioplasty,several surgeries for AV fistula, and dialysis catheters. She has never been a smoker. 10/17/2016 -- had a right ankle x-ray which showed no acute pathology of the right ankle. Arterial vascular calcifications are noted and there is generalized osteopenia and demineralization 11/11/16 Patient appears to be doing fairly  well with regard to her right ankle wound. She has been tolerating the dressing changes with the Santyl that does seem to have loosened up the slough a bit. She has also been tolerating the debridement's when they have been performed. She tells me that she does tend to lay on her right side and this seems to be aggravating the issue. She does not have a Prevalon boot for use at this facility. Fortunately she is not having significant pain. 01/06/17 on evaluation today patient's right lateral ankle wound appears to be doing fairly well. There was some Slough covering although there is no evidence of infection and she does not seem to have significant pain. She is pleased with how this is progressing compared to where it was in the beginning but wishes it would completely close. 01/20/17 on evaluation today patient's wound appears to be doing better. She did have some Slough covering the wound but overall there did not appear to be a significant amount of erythema and definitely no read streaking from the wound itself. She also did not have significant pain. 01/27/17 on evaluation today patient appears to be doing okay although she unfortunately had a recent fall two days ago and during this fall she injured her right knee as well as her left lateral leg where there is a blister there. There is an abrasion over the knee. Unfortunately the knee has been giving her some discomfort and pain. Her right lateral ankle wound does appear to be doing a little bit better  in my opinion in regard to the wound bed where I no longer visualize faster today but rather she does have pink granulation [name], [name] ([id number]) tissue. There is no evidence of infection. 02/13/2017 -- about 3 weeks ago she had had a fall and injured the medial part of her right foot which is now a full-fledged ulcerated area with slough. We have established this as a new wound today. 02/20/17 on evaluation today patient's wounds appeared to be doing about the same although the medial actually is slough covered. Fortunately or infection. She has no knowledge of, vomiting, diarrhea to be getting more significant issue such as sepsis and does not appear to have any pain. With that being said she does not appear to be as alert and oriented action has been in the past. This is obviously a mental shift compared to when I had previously seen her. Unfortunately her husband over the past week and I think this may be weighing heavy on her as well. ===== Old notes: 61 year old patient who is known to be a diabetic and has end-stage renal disease has had several comorbidities including coronary artery disease, hypertension, hyperlipidemia, pancreatitis, anemia, previous history of hysterectomy, cholecystectomy, left-sided salivary gland excision, bilateral cataract surgery,Peritoneal dialysis catheter, hemodialysis catheter. the area on the back has also been caused by instant pressure she used to sleep on a recliner all day and has significant kyphoscoliosis. As far as the wound on her right lower extremity she's not sure how this blister occurred but she thought it has been there for about 2 weeks. No recent blood investigations available and no recent hemoglobin A1c. 12/30/2014 -- she is an assisted living facility but I believe the nurses that have not followed instructions as she had some cream applied on her back and there was a different dressing. Last week she's had a AV fistula placed on  her left forearm. 01/13/2015 -- she has had some localized infection at the port site  and she's been on doxycycline for this. 02/05/2015 - he has developed a small blister on her right lower extremity. 02/10/2015 -- she has developed another small blister on her right anterior chest wall in the area where she's had tape for her dialysis access. this may just be injury caused by a tape burn. She had a dermatology opinion and they have taken a biopsy of her skin. She also had a left brachial AV fistula placed this week. ========= 02/27/17- She is here in follow up; states her blood sugars have been low recently, in the 60s; she denies any fever chills or complications with her wounds. She states she is tolerating dressing changes. There is improvement in measurements. 03/06/2017 -- the patient has recently had the death of her husband and is a bit depressed but other than that she has been doing well with his dressing changes Electronic Signature(s) Signed: 03/24/2017 3:48:14 PM By: [name], [name] Entered By: [name] on 03/24/2017 09:36:14 [name], [name] ([id number]) -------------------------------------------------------------------------------- Physical Exam Details Patient Name: Okubo, 35. Date of Service: 03/24/2017 9:15 AM Medical Record Number: [id number] Patient Account Number: [id number] Date of Birth/Sex: 16-Jan-1956 (61 y.o. Female) Treating RN: Cornell Barman Primary Care Provider: [name] Other Clinician: Referring Provider: [name] Treating Provider/Extender: Frann Rider in Treatment: 23 Constitutional . Pulse regular. Respirations normal and unlabored. Afebrile. . Eyes Nonicteric. Reactive to light. Ears, Nose, Mouth, and Throat Lips, teeth, and gums WNL.Marland Kitchen Moist mucosa without lesions. Neck supple and nontender. No palpable supraclavicular or cervical adenopathy. Normal sized without goiter. Respiratory WNL. No  retractions.. Cardiovascular Pedal Pulses WNL. No clubbing, cyanosis or edema. Lymphatic No adneopathy. No adenopathy. No adenopathy. Musculoskeletal Adexa without tenderness or enlargement.. Digits and nails w/o clubbing, cyanosis, infection, petechiae, ischemia, or inflammatory conditions.. Integumentary (Hair, Skin) No suspicious lesions. No crepitus or fluctuance. No peri-wound warmth or erythema. No masses.Marland Kitchen Psychiatric Judgement and insight Intact.. No evidence of depression, anxiety, or agitation.. Notes the lateral wound is completely healed and there is need to protect this with a bordered foam. The medial foot wound was sharply debrided with a #3 curet and once all the slough was removed there is healthy granulation tissue at the base. Minimal bleeding controlled with pressure Electronic Signature(s) Signed: 03/24/2017 3:48:14 PM By: [name], [name] Entered By: [name] on 03/24/2017 09:37:11 [name], [name] ([id number]) -------------------------------------------------------------------------------- Physician Orders Details [name]. Date of Service: 03/24/2017 9:15 AM Medical Record Number: [id number] Patient Account Number: [id number] Date of Birth/Sex: 07/25/1955 (61 y.o. Female) Treating RN: Cornell Barman Primary Care Provider: [name] Other Clinician: Referring Provider: [name] Treating Provider/Extender: Frann Rider in Treatment: 57 Verbal / Phone Orders: No Diagnosis Coding Wound Cleansing Wound #13 Right,Medial Foot o Clean wound with Normal Saline. o Cleanse wound with mild soap and water o May Shower, gently pat wound dry prior to applying new dressing. Anesthetic Wound #13 Right,Medial Foot o Topical Lidocaine 4% cream applied to wound bed prior to debridement - for clinic use Skin Barriers/Peri-Wound Care Wound #13 Right,Medial Foot o Skin Prep Primary Wound Dressing Wound #13 Right,Medial  Foot o Prisma Ag - moisten with saline before applying Secondary Dressing Wound #13 Right,Medial Foot o Boardered Foam Dressing Dressing Change Frequency Wound #13 Right,Medial Foot o Three times weekly Follow-up Appointments Wound #13 Right,Medial Foot o Return Appointment in 1 week. Edema Control Wound #13 Right,Medial Foot o Elevate legs to the level of the heart and pump ankles as  often as possible [name], [name] ([id number]) Electronic Signature(s) Signed: 03/24/2017 3:48:14 PM By: [name], [name] Signed: 03/24/2017 4:54:08 PM By: Gretta Cool, [name], RN, CWS, Kim RN, [name] Entered By: Gretta Cool, [name], RN, CWS, Kim on 03/24/2017 09:33:49 Belizaire, [name] ([id number]) -------------------------------------------------------------------------------- Problem List Details [name], [name]. Date of Service: 03/24/2017 9:15 AM Medical Record Number: [id number] Patient Account Number: [id number] Date of Birth/Sex: May 13, 1956 (61 y.o. 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 10/10/2016 Yes exposed N18.6 End stage renal disease 10/10/2016 Yes Z99.2 Dependence on renal dialysis 10/10/2016 Yes L97.512 Non-pressure chronic ulcer of other part of right foot with 02/13/2017 Yes fat layer exposed Inactive Problems Resolved Problems ICD-10 Code Description Active Date Resolved Date S80.211A Abrasion, right knee, initial encounter 01/27/2017 01/27/2017 Electronic Signature(s) Signed: 03/24/2017 9:35:43 AM By: [name], [name] Entered By: [name] on 03/24/2017 09:35:43 [name], [name]  ([id number]) -------------------------------------------------------------------------------- Progress Note Details Patient Name: Marrs, Shaili S. Date of Service: 03/24/2017 9:15 AM Medical Record Number: [id number] Patient Account Number: [id number] Date of Birth/Sex: 06-01-1956 (61 y.o. Female) Treating RN: Cornell Barman Primary Care Provider: [name] Other Clinician: Referring Provider: [name] Treating Provider/Extender: Frann Rider in Treatment: 23 Subjective Chief Complaint Information obtained from Patient She is here in follow up evaluation of right foot ulcers History of Present Illness (HPI) The following HPI elements were documented for the patient's wound: Location: right lateral ankle Quality: Patient reports No Pain. Severity: Patient states wound (s) are getting better. Duration: Patient has had the wound for > 1 months prior to seeking treatment at the wound center Context: The wound appeared gradually over time, and she thinks she might have hit it with a walker Modifying Factors: Patient is currently on renal dialysis and receives treatments 3 times weekly 61 year old patient known to our practice from several previous visits for various problems now comes back with an ulcerated area on her right lateral malleolus which she's had for about a month and she thinks she may have hit it with her walker. No other problems and no x-ray of this has been taken. She has a past medical history of atrial fibrillation, carotid artery disease, CHF, chronic kidney disease on hemodialysis with a functioning AV fistula seen by Dr. Corene Cornea dew, diabetes mellitus with peripheral neuropathy, pancreatitis and is status post abdominal hysterectomy, AV fistula placement in June 2016, back surgery, cholecystectomy, coronary angioplasty,several surgeries for AV fistula, and dialysis catheters. She has never been a smoker. 10/17/2016 -- had a right ankle x-ray which showed no acute  pathology of the right ankle. Arterial vascular calcifications are noted and there is generalized osteopenia and demineralization 11/11/16 Patient appears to be doing fairly well with regard to her right ankle wound. She has been tolerating the dressing changes with the Santyl that does seem to have loosened up the slough a bit. She has also been tolerating the debridement's when they have been performed. She tells me that she does tend to lay on her right side and this seems to be aggravating the issue. She does not have a Prevalon boot for use at this facility. Fortunately she is not having significant pain. 01/06/17 on evaluation today patient's right lateral ankle wound appears to  be doing fairly well. There was some Slough covering although there is no evidence of infection and she does not seem to have significant pain. She is pleased with how this is progressing compared to where it was in the beginning but wishes it would completely close. 01/20/17 on evaluation today patient's wound appears to be doing better. She did have some Slough covering the wound but overall there did not appear to be a significant amount of erythema and definitely no read [name], [name] ([id number]) streaking from the wound itself. She also did not have significant pain. 01/27/17 on evaluation today patient appears to be doing okay although she unfortunately had a recent fall two days ago and during this fall she injured her right knee as well as her left lateral leg where there is a blister there. There is an abrasion over the knee. Unfortunately the knee has been giving her some discomfort and pain. Her right lateral ankle wound does appear to be doing a little bit better in my opinion in regard to the wound bed where I no longer visualize faster today but rather she does have pink granulation tissue. There is no evidence of infection. 02/13/2017 -- about 3 weeks ago she had had a fall and injured the medial part  of her right foot which is now a full-fledged ulcerated area with slough. We have established this as a new wound today. 02/20/17 on evaluation today patient's wounds appeared to be doing about the same although the medial actually is slough covered. Fortunately or infection. She has no knowledge of, vomiting, diarrhea to be getting more significant issue such as sepsis and does not appear to have any pain. With that being said she does not appear to be as alert and oriented action has been in the past. This is obviously a mental shift compared to when I had previously seen her. Unfortunately her husband over the past week and I think this may be weighing heavy on her as well. ===== Old notes: 61 year old patient who is known to be a diabetic and has end-stage renal disease has had several comorbidities including coronary artery disease, hypertension, hyperlipidemia, pancreatitis, anemia, previous history of hysterectomy, cholecystectomy, left-sided salivary gland excision, bilateral cataract surgery,Peritoneal dialysis catheter, hemodialysis catheter. the area on the back has also been caused by instant pressure she used to sleep on a recliner all day and has significant kyphoscoliosis. As far as the wound on her right lower extremity she's not sure how this blister occurred but she thought it has been there for about 2 weeks. No recent blood investigations available and no recent hemoglobin A1c. 12/30/2014 -- she is an assisted living facility but I believe the nurses that have not followed instructions as she had some cream applied on her back and there was a different dressing. Last week she's had a AV fistula placed on her left forearm. 01/13/2015 -- she has had some localized infection at the port site and she's been on doxycycline for this. 02/05/2015 - he has developed a small blister on her right lower extremity. 02/10/2015 -- she has developed another small blister on her right  anterior chest wall in the area where she's had tape for her dialysis access. this may just be injury caused by a tape burn. She had a dermatology opinion and they have taken a biopsy of her skin. She also had a left brachial AV fistula placed this week. ========= 02/27/17- She is here in follow up; states her blood sugars  have been low recently, in the 92s; she denies any fever chills or complications with her wounds. She states she is tolerating dressing changes. There is improvement in measurements. 03/06/2017 -- the patient has recently had the death of her husband and is a bit depressed but other than that she has been doing well with his dressing changes Makris, Linzy S. ([id number]) Objective Constitutional Pulse regular. Respirations normal and unlabored. Afebrile. Vitals Time Taken: 9:15 AM, Height: 61 in, Weight: 160 lbs, BMI: 30.2, Temperature: 98.4 F, Pulse: 79 bpm, Respiratory Rate: 18 breaths/min, Blood Pressure: 137/58 mmHg, Pulse Oximetry: 98 %. General Notes: Patient on 2L O2. Eyes Nonicteric. Reactive to light. Ears, Nose, Mouth, and Throat Lips, teeth, and gums WNL.Marland Kitchen Moist mucosa without lesions. Neck supple and nontender. No palpable supraclavicular or cervical adenopathy. Normal sized without goiter. Respiratory WNL. No retractions.. Cardiovascular Pedal Pulses WNL. No clubbing, cyanosis or edema. Lymphatic No adneopathy. No adenopathy. No adenopathy. Musculoskeletal Adexa without tenderness or enlargement.. Digits and nails w/o clubbing, cyanosis, infection, petechiae, ischemia, or inflammatory conditions.Marland Kitchen Psychiatric Judgement and insight Intact.. No evidence of depression, anxiety, or agitation.. General Notes: the lateral wound is completely healed and there is need to protect this with a bordered foam. The medial foot wound was sharply debrided with a #3 curet and once all the slough was removed there is healthy granulation tissue at the base. Minimal  bleeding controlled with pressure Integumentary (Hair, Skin) No suspicious lesions. No crepitus or fluctuance. No peri-wound warmth or erythema. No masses.. Wound #10 status is Healed - Epithelialized. Original cause of wound was Trauma. The wound is located Enberg, Baggs ([id number]) on the Right,Lateral Malleolus. The wound measures 0cm length x 0cm width x 0cm depth; 0cm^2 area and 0cm^3 volume. There is Fat Layer (Subcutaneous Tissue) Exposed exposed. There is no tunneling or undermining noted. There is a medium amount of serous drainage noted. The wound margin is indistinct and nonvisible. There is no granulation within the wound bed. There is no necrotic tissue within the wound bed. The periwound skin appearance did not exhibit: Callus, Crepitus, Excoriation, Induration, Rash, Scarring, Dry/Scaly, Maceration, Atrophie Blanche, Cyanosis, Ecchymosis, Hemosiderin Staining, Mottled, Pallor, Rubor, Erythema. Periwound temperature was noted as No Abnormality. The periwound has tenderness on palpation. Wound #13 status is Open. Original cause of wound was Gradually Appeared. The wound is located on the Right,Medial Foot. The wound measures 0.5cm length x 0.5cm width x 0.2cm depth; 0.196cm^2 area and 0.039cm^3 volume. There is Fat Layer (Subcutaneous Tissue) Exposed exposed. There is no tunneling or undermining noted. There is a medium amount of serous drainage noted. The wound margin is distinct with the outline attached to the wound base. There is no granulation within the wound bed. There is a large (67- 100%) amount of necrotic tissue within the wound bed including Adherent Slough. The periwound skin appearance exhibited: Induration, Maceration, Rubor, Erythema. The surrounding wound skin color is noted with erythema which is circumferential. Periwound temperature was noted as No Abnormality. The periwound has tenderness on palpation. Assessment Active Problems ICD-10 E11.621 - Type 2  diabetes mellitus with foot ulcer L97.312 - Non-pressure chronic ulcer of right ankle with fat layer exposed N18.6 - End stage renal disease Z99.2 - Dependence on renal dialysis L97.512 - Non-pressure chronic ulcer of other part of right foot with fat layer exposed Procedures Wound #13 Pre-procedure diagnosis of Wound #13 is a Diabetic Wound/Ulcer of the Lower Extremity located on the Right,Medial Foot .Severity of Tissue Pre Debridement is: Fat  layer exposed. There was a Skin/Subcutaneous Tissue Debridement ([id number]) debridement with total area of 0.25 sq cm performed by [name], MD. with the following instrument(s): Curette to remove Viable and Non-Viable tissue/material including Exudate and Subcutaneous after achieving pain control using Other (lidocaine 4%). A time out was conducted at 09:29, prior to the start of the procedure. A Minimum amount of bleeding was controlled with Pressure. The procedure was tolerated well with a pain level of 0 throughout and a pain level of 0 following the procedure. Post Debridement Measurements: 0.5cm length x 0.5cm width x 0.2cm depth; 0.039cm^3 volume. [name] ([id number]) Character of Wound/Ulcer Post Debridement requires further debridement. Severity of Tissue Post Debridement is: Fat layer exposed. Post procedure Diagnosis Wound #13: Same as Pre-Procedure Plan Wound Cleansing: Wound #13 Right,Medial Foot: Clean wound with Normal Saline. Cleanse wound with mild soap and water May Shower, gently pat wound dry prior to applying new dressing. Anesthetic: Wound #13 Right,Medial Foot: Topical Lidocaine 4% cream applied to wound bed prior to debridement - for clinic use Skin Barriers/Peri-Wound Care: Wound #13 Right,Medial Foot: Skin Prep Primary Wound Dressing: Wound #13 Right,Medial Foot: Prisma Ag - moisten with saline before applying Secondary Dressing: Wound #13 Right,Medial Foot: Boardered Foam Dressing Dressing Change  Frequency: Wound #13 Right,Medial Foot: Three times weekly Follow-up Appointments: Wound #13 Right,Medial Foot: Return Appointment in 1 week. Edema Control: Wound #13 Right,Medial Foot: Elevate legs to the level of the heart and pump ankles as often as possible after review and sharp debridement today, I have recommended: 1. Prisma Ag locally to be applied daily with a bordered foam, the wound on her right medial foot. 2. bordered foam to the right lateral malleolus 3. Offloading has been discussed with her 4. adequate protein, vitamin A, vitamin C and zinc CANTRELL, MARTUS ([id number]) Electronic Signature(s) Signed: 03/24/2017 4:29:57 PM By: [name], [name] Previous Signature: 03/24/2017 3:48:14 PM Version By: [name], [name] Entered By: [name] on 03/24/2017 15:50:44 Liston, [name] ([id number]) -------------------------------------------------------------------------------- SuperBill Details [name]. Date of Service: 03/24/2017 Medical Record Number: [id number] Patient Account Number: [id number] Date of Birth/Sex: 10/27/1955 (61 y.o. 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 N18.6 End stage renal disease Z99.2 Dependence on renal dialysis L97.512 Non-pressure chronic ulcer of other part of right foot with fat layer exposed Facility Procedures CPT4 Code Description: 44010272 11042 - DEB SUBQ TISSUE 20 SQ CM/< ICD-10 Description Diagnosis E11.621 Type 2 diabetes mellitus with foot ulcer L97.312 Non-pressure chronic ulcer of right ankle with fat N18.6 End stage renal disease Z99.2 Dependence on  renal dialysis Modifier: layer  exposed Quantity: 1 Physician Procedures CPT4 Code Description: 5366440 34742 - WC PHYS SUBQ TISS 20 SQ CM ICD-10 Description Diagnosis E11.621 Type 2 diabetes mellitus with foot ulcer L97.312 Non-pressure chronic ulcer of right ankle with fat N18.6 End stage renal disease Z99.2 Dependence on  renal dialysis Modifier: layer exposed Quantity: 1 Electronic Signature(s) Signed: 03/24/2017 3:48:14 PM By: [name], [name] Entered By: [name] on 03/24/2017 09:39:00

## 2017-03-27 NOTE — Progress Notes (Signed)
CHERILYNN, SCHOMBURG ([id number]) Visit Report for 03/24/2017 Arrival Information Details Patient Name: WESLEE, PRESTAGE. Date of Service: 03/24/2017 9:15 AM Medical Record Number: [id number] Patient Account Number: [id number] Date of Birth/Sex: 1955/11/15 (61 y.o. Female) Treating RN: Cornell Barman Primary Care Chyann Ambrocio: [name] Other Clinician: Referring Ellee Wawrzyniak: [name] Treating Khalid Lacko/Extender: Frann Rider in Treatment: 23 Visit Information History Since Last Visit Added or deleted any medications: No Patient Arrived: Wheel Chair Any new allergies or adverse reactions: No Arrival Time: 09:14 Had a fall or experienced change in No Accompanied By: self activities of daily living that may affect Transfer Assistance: None risk of falls: Patient Identification Verified: Yes Signs or symptoms of abuse/neglect since last No Secondary Verification Process Yes visito Completed: Hospitalized since last visit: No Patient Requires Transmission- No Has Dressing in Place as Prescribed: Yes Based Precautions: Pain Present Now: No Patient Has Alerts: Yes Patient Alerts: Patient on Blood Thinner Electronic Signature(s) Signed: 03/24/2017 4:54:08 PM By: Gretta Cool, [name], RN, CWS, Kim RN, [name] Entered By: Gretta Cool, [name], RN, CWS, Kim on 03/24/2017 09:15:15 Fester, [name] ([id number]) -------------------------------------------------------------------------------- Encounter Discharge Information Details Patient Name: [name] Date of Service: 03/24/2017 9:15 AM Medical Record Number: [id number] Patient Account Number: [id number] Date of Birth/Sex: 02-19-1956 (61 y.o. Female) Treating RN: Cornell Barman Primary Care Magalie Almon: [name] Other Clinician: Referring Otila Starn: [name] Treating Vu Liebman/Extender: Frann Rider in Treatment: 23 Encounter Discharge Information Items Discharge Pain Level: 0 Discharge Condition: Stable Ambulatory Status: Wheelchair Discharge  Destination: Nursing Home Transportation: Private Auto Accompanied By: self Schedule Follow-up Appointment: Yes Medication Reconciliation completed Yes and provided to Patient/Care Mateus Rewerts: Provided on Clinical Summary of Care: 03/24/2017 Form Type Recipient Paper Patient LD Electronic Signature(s) Signed: 03/27/2017 10:12:15 AM By: Ruthine Dose Entered By: Ruthine Dose on 03/24/2017 09:41:30 Deeds, [name] ([id number]) -------------------------------------------------------------------------------- Lower Extremity Assessment Details Patient Name: IZTIW, Dejha S. Date of Service: 03/24/2017 9:15 AM Medical Record Number: [id number] Patient Account Number: [id number] Date of Birth/Sex: 03/12/1956 (61 y.o. Female) Treating RN: Cornell Barman Primary Care Jaylianna Tatlock: [name] Other Clinician: Referring Satrina Magallanes: [name] Treating Amena Dockham/Extender: Frann Rider in Treatment: 23 Vascular Assessment Claudication: Claudication Assessment [Right:None] Pulses: Dorsalis Pedis Palpable: [Right:Yes] Posterior Tibial Extremity colors, hair growth, and conditions: Extremity Color: [Right:Normal] Hair Growth on Extremity: [Right:No] Temperature of Extremity: [Right:Warm] Capillary Refill: [Right:< 3 seconds] Toe Nail Assessment Left: Right: Thick: No Discolored: No Deformed: No Improper Length and Hygiene: No Electronic Signature(s) Signed: 03/24/2017 4:54:08 PM By: Gretta Cool, [name], RN, CWS, Kim RN, [name] Entered By: Gretta Cool, [name], RN, CWS, Kim on 03/24/2017 09:25:50 Loll, [name] ([id number]) -------------------------------------------------------------------------------- Multi Wound Chart Details Patient Name: [name] Date of Service: 03/24/2017 9:15 AM Medical Record Number: [id number] Patient Account Number: [id number] Date of Birth/Sex: Aug 13, 1955 (61 y.o. Female) Treating RN: Cornell Barman Primary Care Trask Vosler: [name] Other Clinician: Referring Kaicee Scarpino: [name] Treating Blossie Raffel/Extender: Frann Rider in Treatment: 23 Vital Signs Height(in): 61 Pulse(bpm): 79 Weight(lbs): 160 Blood Pressure 137/58 (mmHg): Body Mass Index(BMI): 30 Temperature(F): 98.4 Respiratory Rate 18 (breaths/min): Photos: [N/A:N/A] Wound Location: Right Malleolus - LateralRight Foot - Medial N/A Wounding Event: Trauma Gradually Appeared N/A Primary Etiology: Trauma, Other Diabetic Wound/Ulcer of N/A the Lower Extremity Comorbid History: Glaucoma, Anemia, Glaucoma, Anemia, N/A Coronary Artery Disease, Coronary Artery Disease, Hypertension, Type II Hypertension, Type II Diabetes, End Stage Diabetes, End Stage Renal Disease, Renal Disease, Osteoarthritis, Neuropathy Osteoarthritis, Neuropathy Date Acquired: 09/12/2016 12/14/2016 N/A Weeks of Treatment: 23 5 N/A Wound Status: Healed -  Epithelialized Open N/A Measurements L x W x D 0x0x0 0.5x0.5x0.2 N/A (cm) Area (cm) : 0 0.196 N/A Volume (cm) : 0 0.039 N/A % Reduction in Area: 100.00% 72.30% N/A % Reduction in Volume: 100.00% 72.30% N/A Classification: Full Thickness Without Grade 1 N/A Exposed Support Structures Exudate Amount: Medium Medium N/A Exudate Type: Serous Serous N/A Exudate Color: amber amber N/A Lysne, Jame Chauncey Cruel ([id number]) Wound Margin: Indistinct, nonvisible Distinct, outline attached N/A Granulation Amount: None Present (0%) None Present (0%) N/A Necrotic Amount: None Present (0%) Large (67-100%) N/A Exposed Structures: Fat Layer (Subcutaneous Fat Layer (Subcutaneous N/A Tissue) Exposed: Yes Tissue) Exposed: Yes Fascia: No Tendon: No Muscle: No Joint: No Bone: No Epithelialization: Large (67-100%) None N/A Debridement: N/A Debridement ([id number]) Pre-procedure N/A 09:29 N/A Verification/Time Out Taken: Pain Control: N/A Other N/A Tissue Debrided: N/A Exudates, Subcutaneous N/A Level: N/A Skin/Subcutaneous N/A Tissue Debridement Area (sq N/A 0.25  N/A cm): Instrument: N/A Curette N/A Bleeding: N/A Minimum N/A Hemostasis Achieved: N/A Pressure N/A Procedural Pain: N/A 0 N/A Post Procedural Pain: N/A 0 N/A Debridement Treatment N/A Procedure was tolerated N/A Response: well Post Debridement N/A 0.5x0.5x0.2 N/A Measurements L x W x D (cm) Post Debridement N/A 0.039 N/A Volume: (cm) Periwound Skin Texture: Excoriation: No Induration: Yes N/A Induration: No Callus: No Crepitus: No Rash: No Scarring: No Periwound Skin Maceration: No Maceration: Yes N/A Moisture: Dry/Scaly: No Periwound Skin Color: Atrophie Blanche: No Erythema: Yes N/A Cyanosis: No Rubor: Yes Ecchymosis: No Erythema: No Hemosiderin Staining: No Mottled: No Pallor: No Rubor: No Arango, Aleaha S. ([id number]) Erythema Location: N/A Circumferential N/A Temperature: No Abnormality No Abnormality N/A Tenderness on Yes Yes N/A Palpation: Wound Preparation: Ulcer Cleansing: Ulcer Cleansing: N/A Rinsed/Irrigated with Rinsed/Irrigated with Saline Saline Topical Anesthetic Topical Anesthetic Applied: None Applied: Other: lidocaine 4% Procedures Performed: N/A Debridement N/A Treatment Notes Electronic Signature(s) Signed: 03/24/2017 3:48:14 PM By: [name], [name] Entered By: [name] on 03/24/2017 09:35:48 Weinel, [name] ([id number]) -------------------------------------------------------------------------------- [place] Details Patient Name: SHANICQUA, COLDREN. Date of Service: 03/24/2017 9:15 AM Medical Record Number: [id number] Patient Account Number: [id number] Date of Birth/Sex: [birth year] (61 y.o. Female) Treating RN: Cornell Barman Primary Care Jaleah Lefevre: [name] Other Clinician: Referring Omero Kowal: [name] Treating Carlyann Placide/Extender: Frann Rider in Treatment: 23 Active Inactive ` Abuse / Safety / Falls / Self Care Management Nursing Diagnoses: Potential for falls Goals: Patient will remain  injury free Date Initiated: 10/10/2016 Target Resolution Date: 12/23/2016 Goal Status: Active Interventions: Assess fall risk on admission and as needed Notes: ` Nutrition Nursing Diagnoses: Potential for alteratiion in Nutrition/Potential for imbalanced nutrition Goals: Patient/caregiver agrees to and verbalizes understanding of need to use nutritional supplements and/or vitamins as prescribed Date Initiated: 10/10/2016 Target Resolution Date: 10/21/2016 Goal Status: Active Interventions: Assess patient nutrition upon admission and as needed per policy Notes: ` Orientation to the Wound Care Program Nursing Diagnoses: Knowledge deficit related to the wound healing center program LAHNA, NATH ([id number]) Goals: Patient/caregiver will verbalize understanding of the [place] Date Initiated: 10/10/2016 Target Resolution Date: 12/23/2016 Goal Status: Active Interventions: Provide education on orientation to the wound center Notes: ` Wound/Skin Impairment Nursing Diagnoses: Impaired tissue integrity Goals: Patient/caregiver will verbalize understanding of skin care regimen Date Initiated: 10/10/2016 Target Resolution Date: 12/23/2016 Goal Status: Active Ulcer/skin breakdown will have a volume reduction of 30% by week 4 Date Initiated: 10/10/2016 Target Resolution Date: 12/23/2016 Goal Status: Active Ulcer/skin breakdown will have a volume reduction of 50% by  week 8 Date Initiated: 10/10/2016 Target Resolution Date: 12/23/2016 Goal Status: Active Ulcer/skin breakdown will have a volume reduction of 80% by week 12 Date Initiated: 10/10/2016 Target Resolution Date: 12/23/2016 Goal Status: Active Ulcer/skin breakdown will heal within 14 weeks Date Initiated: 10/10/2016 Target Resolution Date: 12/23/2016 Goal Status: Active Interventions: Assess patient/caregiver ability to obtain necessary supplies Assess patient/caregiver ability to perform ulcer/skin care regimen  upon admission and as needed Assess ulceration(s) every visit Notes: Electronic Signature(s) Signed: 03/24/2017 4:54:08 PM By: Gretta Cool, [name], RN, CWS, Kim RN, [name] Entered By: Gretta Cool, [name], RN, CWS, Kim on 03/24/2017 09:29:43 Ehrman, [name] ([id number]) -------------------------------------------------------------------------------- Pain Assessment Details Patient Name: [name] Date of Service: 03/24/2017 9:15 AM Medical Record Number: [id number] Patient Account Number: [id number] Date of Birth/Sex: 06-Feb-1956 (61 y.o. Female) Treating RN: Cornell Barman Primary Care Giuliano Preece: [name] Other Clinician: Referring Kayzlee Wirtanen: [name] Treating Angelino Rumery/Extender: Frann Rider in Treatment: 23 Active Problems Location of Pain Severity and Description of Pain Patient Has Paino No Site Locations With Dressing Change: No Pain Management and Medication Current Pain Management: Goals for Pain Management Topical or injectable lidocaine is offered to patient for acute pain when surgical debridement is performed. If needed, Patient is instructed to use over the counter pain medication for the following 24-48 hours after debridement. Wound care MDs do not prescribed pain medications. Patient has chronic pain or uncontrolled pain. Patient has been instructed to make an appointment with their Primary Care Physician for pain management. Electronic Signature(s) Signed: 03/24/2017 4:54:08 PM By: Gretta Cool, [name], RN, CWS, Kim RN, [name] Entered By: Gretta Cool, [name], RN, CWS, Kim on 03/24/2017 09:15:38 Gurney, [name] ([id number]) -------------------------------------------------------------------------------- Patient/Caregiver Education Details Patient Name: [name] Date of Service: 03/24/2017 9:15 AM Medical Record Number: [id number] Patient Account Number: [id number] Date of Birth/Gender: 04-04-1956 (61 y.o. Female) Treating RN: Cornell Barman Primary Care Physician: [name] Other  Clinician: Referring Physician: [name] Treating Physician/Extender: Frann Rider in Treatment: 6 Education Assessment Education Provided To: Patient Education Topics Provided Wound/Skin Impairment: Handouts: Caring for Your Ulcer, Other: continue wound care as prescribed Methods: Demonstration, Explain/Verbal Responses: State content correctly Electronic Signature(s) Signed: 03/24/2017 4:54:08 PM By: Gretta Cool, [name], RN, CWS, Kim RN, [name] Entered By: Gretta Cool, [name], RN, CWS, Kim on 03/24/2017 09:40:15 Hausmann, [name] ([id number]) -------------------------------------------------------------------------------- Wound Assessment Details Patient Name: KAIZLEY, AJA. Date of Service: 03/24/2017 9:15 AM Medical Record Number: [id number] Patient Account Number: [id number] Date of Birth/Sex: [birth year] (61 y.o. Female) Treating RN: Cornell Barman Primary Care Asusena Sigley: [name] Other Clinician: Referring Giovoni Bunch: [name] Treating Damire Remedios/Extender: Frann Rider in Treatment: 23 Wound Status Wound Number: 10 Primary Trauma, Other Etiology: Wound Location: Right Malleolus - Lateral Wound Healed - Epithelialized Wounding Event: Trauma Status: Date Acquired: 09/12/2016 Comorbid Glaucoma, Anemia, Coronary Artery Weeks Of Treatment: 23 History: Disease, Hypertension, Type II Clustered Wound: No Diabetes, End Stage Renal Disease, Osteoarthritis, Neuropathy Photos Wound Measurements Length: (cm) 0 % Reduction i Width: (cm) 0 % Reduction i Depth: (cm) 0 Epithelializa Area: (cm) 0 Tunneling: Volume: (cm) 0 Undermining: n Area: 100% n Volume: 100% tion: Large (67-100%) No No Wound Description Full Thickness Without Exposed Classification: Support Structures Wound Margin: Indistinct, nonvisible Exudate Medium Amount: Exudate Type: Serous Exudate Color: amber Foul Odor After Cleansing: No Slough/Fibrino Yes Wound Bed Granulation Amount: None Present  (0%) Exposed Structure Necrotic Amount: None Present (0%) Fascia Exposed: No Fat Layer (Subcutaneous Tissue) Exposed: Yes Tendon Exposed: No Rosenberger, Markeshia S. ([id number]) Muscle Exposed: No Joint  Exposed: No Bone Exposed: No Periwound Skin Texture Texture Color No Abnormalities Noted: No No Abnormalities Noted: No Callus: No Atrophie Blanche: No Crepitus: No Cyanosis: No Excoriation: No Ecchymosis: No Induration: No Erythema: No Rash: No Hemosiderin Staining: No Scarring: No Mottled: No Pallor: No Moisture Rubor: No No Abnormalities Noted: No Dry / Scaly: No Temperature / Pain Maceration: No Temperature: No Abnormality Tenderness on Palpation: Yes Wound Preparation Ulcer Cleansing: Rinsed/Irrigated with Saline Topical Anesthetic Applied: None Electronic Signature(s) Signed: 03/24/2017 4:54:08 PM By: Gretta Cool, [name], RN, CWS, Kim RN, [name] Entered By: Gretta Cool, [name], RN, CWS, Kim on 03/24/2017 09:32:33 Ahlers, [name] ([id number]) -------------------------------------------------------------------------------- Wound Assessment Details Patient Name: [name] Date of Service: 03/24/2017 9:15 AM Medical Record Number: [id number] Patient Account Number: [id number] Date of Birth/Sex: [birth year] (61 y.o. Female) Treating RN: Cornell Barman Primary Care Alene Bergerson: [name] Other Clinician: Referring Jansen Sciuto: [name] Treating Jkai Arwood/Extender: Frann Rider in Treatment: 23 Wound Status Wound Number: 13 Primary Diabetic Wound/Ulcer of the Lower Etiology: Extremity Wound Location: Right Foot - Medial Wound Open Wounding Event: Gradually Appeared Status: Date Acquired: 12/14/2016 Comorbid Glaucoma, Anemia, Coronary Artery Weeks Of Treatment: 5 History: Disease, Hypertension, Type II Clustered Wound: No Diabetes, End Stage Renal Disease, Osteoarthritis, Neuropathy Photos Wound Measurements Length: (cm) 0.5 Width: (cm) 0.5 Depth: (cm) 0.2 Area: (cm)  0.196 Volume: (cm) 0.039 % Reduction in Area: 72.3% % Reduction in Volume: 72.3% Epithelialization: None Tunneling: No Undermining: No Wound Description Classification: Grade 1 Foul Odor Aft Wound Margin: Distinct, outline attached Slough/Fibrin Exudate Amount: Medium Exudate Type: Serous Exudate Color: amber er Cleansing: No o Yes Wound Bed Granulation Amount: None Present (0%) Exposed Structure Necrotic Amount: Large (67-100%) Fat Layer (Subcutaneous Tissue) Exposed: Yes Necrotic Quality: Adherent Slough Periwound Skin Texture Texture Color Sotelo, Akeyla S. ([id number]) No Abnormalities Noted: No No Abnormalities Noted: No Induration: Yes Erythema: Yes Erythema Location: Circumferential Moisture Rubor: Yes No Abnormalities Noted: No Maceration: Yes Temperature / Pain Temperature: No Abnormality Tenderness on Palpation: Yes Wound Preparation Ulcer Cleansing: Rinsed/Irrigated with Saline Topical Anesthetic Applied: Other: lidocaine 4%, Treatment Notes Wound #13 (Right, Medial Foot) 1. Cleansed with: Clean wound with Normal Saline 2. Anesthetic Topical Lidocaine 4% cream to wound bed prior to debridement 4. Dressing Applied: Prisma Ag 5. Secondary Dressing Applied Bordered Foam Dressing Electronic Signature(s) Signed: 03/24/2017 4:54:08 PM By: Gretta Cool, [name], RN, CWS, Kim RN, [name] Entered By: Gretta Cool, [name], RN, CWS, Kim on 03/24/2017 11:40:24 Tiedeman, [name] ([id number]) -------------------------------------------------------------------------------- Vitals Details Patient Name: [name] Date of Service: 03/24/2017 9:15 AM Medical Record Number: [id number] Patient Account Number: [id number] Date of Birth/Sex: 1956-06-13 (61 y.o. Female) Treating RN: Cornell Barman Primary Care Jencarlo Bonadonna: [name] Other Clinician: Referring Aneth Schlagel: [name] Treating Eugean Arnott/Extender: Frann Rider in Treatment: 23 Vital Signs Time Taken: 09:15 Temperature (F):  98.4 Height (in): 61 Pulse (bpm): 79 Weight (lbs): 160 Respiratory Rate (breaths/min): 18 Body Mass Index (BMI): 30.2 Blood Pressure (mmHg): 137/58 Reference Range: 80 - 120 mg / dl Pulse Oximetry (%): 98 Notes Patient on 2L O2. Electronic Signature(s) Signed: 03/24/2017 4:54:08 PM By: Gretta Cool, [name], RN, CWS, Kim RN, [name] Entered By: Gretta Cool, [name], RN, CWS, Kim on 03/24/2017 09:16:54

## 2017-03-29 ENCOUNTER — Encounter (INDEPENDENT_AMBULATORY_CARE_PROVIDER_SITE_OTHER): Payer: Medicare Other

## 2017-03-29 ENCOUNTER — Ambulatory Visit (INDEPENDENT_AMBULATORY_CARE_PROVIDER_SITE_OTHER): Payer: Medicare Other | Admitting: Vascular Surgery

## 2017-03-31 ENCOUNTER — Ambulatory Visit: Payer: Medicare Other | Admitting: Surgery

## 2017-04-05 ENCOUNTER — Encounter (INDEPENDENT_AMBULATORY_CARE_PROVIDER_SITE_OTHER): Payer: Self-pay

## 2017-04-05 ENCOUNTER — Ambulatory Visit (INDEPENDENT_AMBULATORY_CARE_PROVIDER_SITE_OTHER): Payer: Medicare Other

## 2017-04-05 ENCOUNTER — Ambulatory Visit (INDEPENDENT_AMBULATORY_CARE_PROVIDER_SITE_OTHER): Payer: Medicare Other | Admitting: Vascular Surgery

## 2017-04-05 DIAGNOSIS — N186 End stage renal disease: Secondary | ICD-10-CM | POA: Diagnosis not present

## 2017-04-05 DIAGNOSIS — Z992 Dependence on renal dialysis: Secondary | ICD-10-CM | POA: Diagnosis not present

## 2017-04-07 ENCOUNTER — Encounter: Payer: Medicare Other | Admitting: Surgery

## 2017-04-07 DIAGNOSIS — E11621 Type 2 diabetes mellitus with foot ulcer: Secondary | ICD-10-CM | POA: Diagnosis not present

## 2017-04-09 NOTE — Progress Notes (Signed)
KANON, [name] ([id number]) Visit Report for 04/07/2017 Chief Complaint Document Details [name], [name] [name]. Date of Service: 04/07/2017 8:45 AM Medical Record Number: [id number] Patient Account Number: [id number] Date of Birth/Sex: [birth year] (61 y.o. Female) Treating RN: Carolyne Fiscal, Debi Primary Care Provider: [name] Other Clinician: Referring Provider: [name] Treating Provider/Extender: Frann Rider in Treatment: 25 Information Obtained from: Patient Chief Complaint She is here in follow up evaluation of right foot ulcers Electronic Signature(s) Signed: 04/07/2017 4:09:30 PM By: [name], [name] Entered By: [name] on 04/07/2017 09:17:18 [name] [name] ([id number]) -------------------------------------------------------------------------------- HPI Details [name] [name]. Date of Service: 04/07/2017 8:45 AM Medical Record Number: [id number] Patient Account Number: [id number] Date of Birth/Sex: 02-Oct-1955 (61 y.o. Female) Treating RN: Carolyne Fiscal, Debi Primary Care Provider: [name] Other Clinician: Referring Provider: [name] Treating Provider/Extender: Frann Rider in Treatment: 25 History of Present Illness Location: right lateral ankle Quality: Patient reports No Pain. Severity: Patient states wound (s) are getting better. Duration: Patient has had the wound for > 1 months prior to seeking treatment at the wound center Context: The wound appeared gradually over time, and she thinks she might have hit it with a walker Modifying Factors: Patient is currently on renal dialysis and receives treatments 3 times weekly HPI Description: 61 year old patient known to our practice from several previous visits for various problems now comes back with an ulcerated area on her right lateral malleolus which she's had for about a month and she thinks she may have hit it with her walker. No other problems and no x-ray of  this has been taken. She has a past medical history of atrial fibrillation, carotid artery disease, CHF, chronic kidney disease on hemodialysis with a functioning AV fistula seen by Dr. Corene Cornea dew, diabetes mellitus with peripheral neuropathy, pancreatitis and is status post abdominal hysterectomy, AV fistula placement in June 2016, back surgery, cholecystectomy, coronary angioplasty,several surgeries for AV fistula, and dialysis catheters. She has never been a smoker. 10/17/2016 -- had a right ankle x-ray which showed no acute pathology of the right ankle. Arterial vascular calcifications are noted and there is generalized osteopenia and demineralization 11/11/16 Patient appears to be doing fairly well with regard to her right ankle wound. She has been tolerating the dressing changes with the Santyl that does seem to have loosened up the slough a bit. She has also been tolerating the debridement's when they have been performed. She tells me that she does tend to lay on her right side and this seems to be aggravating the issue. She does not have a Prevalon boot for use at this facility. Fortunately she is not having significant pain. 01/06/17 on evaluation today patient's right lateral ankle wound appears to be doing fairly well. There was some Slough covering although there is no evidence of infection and she does not seem to have significant pain. She is pleased with how this is progressing compared to where it was in the beginning but wishes it would completely close. 01/20/17 on evaluation today patient's wound appears to be doing better. She did have some Slough covering the wound but overall there did not appear to be a significant amount of erythema and definitely no read streaking from the wound itself. She also did not have significant pain. 01/27/17 on evaluation today patient appears to be doing okay although she unfortunately had a recent fall two days ago and during this fall she injured  her right knee as well as her  left lateral leg where there is a blister there. There is an abrasion over the knee. Unfortunately the knee has been giving her some discomfort and pain. Her right lateral ankle wound does appear to be doing a little bit better in my opinion in regard to the wound bed where I no longer visualize faster today but rather she does have pink granulation [name], [name] S. ([id number]) tissue. There is no evidence of infection. 02/13/2017 -- about 3 weeks ago she had had a fall and injured the medial part of her right foot which is now a full-fledged ulcerated area with slough. We have established this as a new wound today. 02/20/17 on evaluation today patient's wounds appeared to be doing about the same although the medial actually is slough covered. Fortunately or infection. She has no knowledge of, vomiting, diarrhea to be getting more significant issue such as sepsis and does not appear to have any pain. With that being said she does not appear to be as alert and oriented action has been in the past. This is obviously a mental shift compared to when I had previously seen her. Unfortunately her husband over the past week and I think this may be weighing heavy on her as well. ===== Old notes: 61 year old patient who is known to be a diabetic and has end-stage renal disease has had several comorbidities including coronary artery disease, hypertension, hyperlipidemia, pancreatitis, anemia, previous history of hysterectomy, cholecystectomy, left-sided salivary gland excision, bilateral cataract surgery,Peritoneal dialysis catheter, hemodialysis catheter. the area on the back has also been caused by instant pressure she used to sleep on a recliner all day and has significant kyphoscoliosis. As far as the wound on her right lower extremity she's not sure how this blister occurred but she thought it has been there for about 2 weeks. No recent blood investigations available and  no recent hemoglobin A1c. 12/30/2014 -- she is an assisted living facility but I believe the nurses that have not followed instructions as she had some cream applied on her back and there was a different dressing. Last week she's had a AV fistula placed on her left forearm. 01/13/2015 -- she has had some localized infection at the port site and she's been on doxycycline for this. 02/05/2015 - he has developed a small blister on her right lower extremity. 02/10/2015 -- she has developed another small blister on her right anterior chest wall in the area where she's had tape for her dialysis access. this may just be injury caused by a tape burn. She had a dermatology opinion and they have taken a biopsy of her skin. She also had a left brachial AV fistula placed this week. ========= 02/27/17- She is here in follow up; states her blood sugars have been low recently, in the 60s; she denies any fever chills or complications with her wounds. She states she is tolerating dressing changes. There is improvement in measurements. 03/06/2017 -- the patient has recently had the death of her husband and is a bit depressed but other than that she has been doing well with his dressing changes Electronic Signature(s) Signed: 04/07/2017 4:09:30 PM By: [name], [name] Entered By: [name] on 04/07/2017 09:17:25 [name] [name] ([id number]) -------------------------------------------------------------------------------- Physical Exam Details [name] [name]. Date of Service: 04/07/2017 8:45 AM Medical Record Number: [id number] Patient Account Number: [id number] Date of Birth/Sex: 1956/05/13 (61 y.o. Female) Treating RN: Carolyne Fiscal, Debi Primary Care Provider: [name] Other Clinician: Referring Provider: [name] Treating Provider/Extender: Con Memos,  Ed Mandich [name]: 25 Constitutional . Pulse regular. Respirations normal and unlabored. Afebrile. . Eyes Nonicteric.  Reactive to light. Ears, Nose, Mouth, and Throat Lips, teeth, and gums WNL.Marland Kitchen Moist mucosa without lesions. Neck supple and nontender. No palpable supraclavicular or cervical adenopathy. Normal sized without goiter. Respiratory WNL. No retractions.. Cardiovascular Pedal Pulses WNL. No clubbing, cyanosis or edema. Lymphatic No adneopathy. No adenopathy. No adenopathy. Musculoskeletal Adexa without tenderness or enlargement.. Digits and nails w/o clubbing, cyanosis, infection, petechiae, ischemia, or inflammatory conditions.. Integumentary (Hair, Skin) No suspicious lesions. No crepitus or fluctuance. No peri-wound warmth or erythema. No masses.Marland Kitchen Psychiatric Judgement and insight Intact.. No evidence of depression, anxiety, or agitation.. Notes lateral ankle wound is completely healed in the median 1 once was washed out with moist saline gauze has good granulation tissue and good amount of epithelialization from the edges. Electronic Signature(s) Signed: 04/07/2017 4:09:30 PM By: [name], [name] Entered By: [name] on 04/07/2017 09:17:58 [name] [name] ([id number]) -------------------------------------------------------------------------------- Physician Orders Details [name] [name]. Date of Service: 04/07/2017 8:45 AM Medical Record Number: [id number] Patient Account Number: [id number] Date of Birth/Sex: 09-Aug-1955 (61 y.o. Female) Treating RN: Carolyne Fiscal, Debi Primary Care Provider: [name] Other Clinician: Referring Provider: [name] Treating Provider/Extender: Frann Rider in Treatment: 25 Verbal / Phone Orders: Yes Clinician: Carolyne Fiscal, Debi Read Back and Verified: Yes Diagnosis Coding Wound Cleansing Wound #13 Right,Medial Foot o Clean wound with Normal Saline. o Cleanse wound with mild soap and water o May Shower, gently pat wound dry prior to applying new dressing. Anesthetic Wound #13 Right,Medial Foot o Topical  Lidocaine 4% cream applied to wound bed prior to debridement - for clinic use Skin Barriers/Peri-Wound Care Wound #13 Right,Medial Foot o Skin Prep Primary Wound Dressing Wound #13 Right,Medial Foot o Prisma Ag - moisten with saline before applying Secondary Dressing Wound #13 Right,Medial Foot o Dry Gauze o Boardered Foam Dressing Dressing Change Frequency Wound #13 Right,Medial Foot o Three times weekly Follow-up Appointments Wound #13 Right,Medial Foot o Return Appointment in 1 week. Edema Control Wound #13 Right,Medial Foot o Elevate legs to the level of the heart and pump ankles as often as possible [name], [name] S. ([id number]) Oxygen Administration o While patient is in clinic, provide supplimental oxygen via nasal cannula at liters/min __ : - 2 LPM via [place] Electronic Signature(s) Signed: 04/07/2017 4:09:30 PM By: [name], [name] Signed: 04/07/2017 5:11:04 PM By: Alric Quan Entered By: Alric Quan on 04/07/2017 09:20:07 Ponzo, [name] [name] ([id number]) -------------------------------------------------------------------------------- Problem List Details Patient Name: [name], [name] S. Date of Service: 04/07/2017 8:45 AM Medical Record Number: [id number] Patient Account Number: [id number] Date of Birth/Sex: 1956-02-03 (61 y.o. 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/10/2016 Yes exposed N18.6 End stage renal disease 10/10/2016 Yes Z99.2 Dependence on renal dialysis 10/10/2016 Yes L97.512 Non-pressure chronic ulcer of other part of right foot with 02/13/2017 Yes fat layer exposed Inactive  Problems Resolved Problems ICD-10 Code Description Active Date Resolved Date S80.211A Abrasion, right knee, initial encounter 01/27/2017 01/27/2017 Electronic Signature(s) Signed: 04/07/2017 4:09:30 PM By: [name], [name] Entered By: [name] on 04/07/2017 09:17:03 Glahn, [name] [name] ([id number]) -------------------------------------------------------------------------------- Progress Note Details Patient Name: [name], [name] S. Date of Service:  04/07/2017 8:45 AM Medical Record Number: [id number] Patient Account Number: [id number] Date of Birth/Sex: 23-Oct-1955 (61 y.o. Female) Treating RN: Carolyne Fiscal, Debi Primary Care Provider: [name] Other Clinician: Referring Provider: [name] Treating Provider/Extender: Frann Rider in Treatment: 25 Subjective Chief Complaint Information obtained from Patient She is here in follow up evaluation of right foot ulcers History of Present Illness (HPI) The following HPI elements were documented for the patient's wound: Location: right lateral ankle Quality: Patient reports No Pain. Severity: Patient states wound (s) are getting better. Duration: Patient has had the wound for > 1 months prior to seeking treatment at the wound center Context: The wound appeared gradually over time, and she thinks she might have hit it with a walker Modifying Factors: Patient is currently on renal dialysis and receives treatments 3 times weekly 61 year old patient known to our practice from several previous visits for various problems now comes back with an ulcerated area on her right lateral malleolus which she's had for about a month and she thinks she may have hit it with her walker. No other problems and no x-ray of this has been taken. She has a past medical history of atrial fibrillation, carotid artery disease, CHF, chronic kidney disease on hemodialysis with a functioning AV fistula seen by Dr. Corene Cornea dew, diabetes mellitus with  peripheral neuropathy, pancreatitis and is status post abdominal hysterectomy, AV fistula placement in June 2016, back surgery, cholecystectomy, coronary angioplasty,several surgeries for AV fistula, and dialysis catheters. She has never been a smoker. 10/17/2016 -- had a right ankle x-ray which showed no acute pathology of the right ankle. Arterial vascular calcifications are noted and there is generalized osteopenia and demineralization 11/11/16 Patient appears to be doing fairly well with regard to her right ankle wound. She has been tolerating the dressing changes with the Santyl that does seem to have loosened up the slough a bit. She has also been tolerating the debridement's when they have been performed. She tells me that she does tend to lay on her right side and this seems to be aggravating the issue. She does not have a Prevalon boot for use at this facility. Fortunately she is not having significant pain. 01/06/17 on evaluation today patient's right lateral ankle wound appears to be doing fairly well. There was some Slough covering although there is no evidence of infection and she does not seem to have significant pain. She is pleased with how this is progressing compared to where it was in the beginning but wishes it would completely close. 01/20/17 on evaluation today patient's wound appears to be doing better. She did have some Slough covering the wound but overall there did not appear to be a significant amount of erythema and definitely no read [name], [name] S. ([id number]) streaking from the wound itself. She also did not have significant pain. 01/27/17 on evaluation today patient appears to be doing okay although she unfortunately had a recent fall two days ago and during this fall she injured her right knee as well as her left lateral leg where there is a blister there. There is an abrasion over the knee. Unfortunately the knee has been giving her some discomfort and pain. Her  right lateral ankle wound does appear to be doing a little bit better in my opinion in regard to the wound bed where I no longer visualize faster today but rather she does have pink granulation tissue. There is no evidence of infection. 02/13/2017 -- about 3 weeks ago she had had  a fall and injured the medial part of her right foot which is now a full-fledged ulcerated area with slough. We have established this as a new wound today. 02/20/17 on evaluation today patient's wounds appeared to be doing about the same although the medial actually is slough covered. Fortunately or infection. She has no knowledge of, vomiting, diarrhea to be getting more significant issue such as sepsis and does not appear to have any pain. With that being said she does not appear to be as alert and oriented action has been in the past. This is obviously a mental shift compared to when I had previously seen her. Unfortunately her husband over the past week and I think this may be weighing heavy on her as well. ===== Old notes: 61 year old patient who is known to be a diabetic and has end-stage renal disease has had several comorbidities including coronary artery disease, hypertension, hyperlipidemia, pancreatitis, anemia, previous history of hysterectomy, cholecystectomy, left-sided salivary gland excision, bilateral cataract surgery,Peritoneal dialysis catheter, hemodialysis catheter. the area on the back has also been caused by instant pressure she used to sleep on a recliner all day and has significant kyphoscoliosis. As far as the wound on her right lower extremity she's not sure how this blister occurred but she thought it has been there for about 2 weeks. No recent blood investigations available and no recent hemoglobin A1c. 12/30/2014 -- she is an assisted living facility but I believe the nurses that have not followed instructions as she had some cream applied on her back and there was a different dressing.  Last week she's had a AV fistula placed on her left forearm. 01/13/2015 -- she has had some localized infection at the port site and she's been on doxycycline for this. 02/05/2015 - he has developed a small blister on her right lower extremity. 02/10/2015 -- she has developed another small blister on her right anterior chest wall in the area where she's had tape for her dialysis access. this may just be injury caused by a tape burn. She had a dermatology opinion and they have taken a biopsy of her skin. She also had a left brachial AV fistula placed this week. ========= 02/27/17- She is here in follow up; states her blood sugars have been low recently, in the 60s; she denies any fever chills or complications with her wounds. She states she is tolerating dressing changes. There is improvement in measurements. 03/06/2017 -- the patient has recently had the death of her husband and is a bit depressed but other than that she has been doing well with his dressing changes [name] [name], [name] S. ([id number]) Objective Constitutional Pulse regular. Respirations normal and unlabored. Afebrile. Vitals Time Taken: 8:43 AM, Height: 61 in, Weight: 160 lbs, BMI: 30.2, Temperature: 98.1 F, Pulse: 76 bpm, Respiratory Rate: 18 breaths/min, Blood Pressure: 154/58 mmHg. Eyes Nonicteric. Reactive to light. Ears, Nose, Mouth, and Throat Lips, teeth, and gums WNL.Marland Kitchen Moist mucosa without lesions. Neck supple and nontender. No palpable supraclavicular or cervical adenopathy. Normal sized without goiter. Respiratory WNL. No retractions.. Cardiovascular Pedal Pulses WNL. No clubbing, cyanosis or edema. Lymphatic No adneopathy. No adenopathy. No adenopathy. Musculoskeletal Adexa without tenderness or enlargement.. Digits and nails w/o clubbing, cyanosis, infection, petechiae, ischemia, or inflammatory conditions.Marland Kitchen Psychiatric Judgement and insight Intact.. No evidence of depression, anxiety, or agitation.. General  Notes: lateral ankle wound is completely healed in the median 1 once was washed out with moist saline gauze has good granulation tissue and good amount of epithelialization from  the edges. Integumentary (Hair, Skin) No suspicious lesions. No crepitus or fluctuance. No peri-wound warmth or erythema. No masses.. Wound #13 status is Open. Original cause of wound was Gradually Appeared. The wound is located on the Right,Medial Foot. The wound measures 0.5cm length x 0.9cm width x 0.2cm depth; 0.353cm^2 area and 0.071cm^3 volume. There is Fat Layer (Subcutaneous Tissue) Exposed exposed. There is no tunneling or [name], [name] S. ([id number]) undermining noted. There is a large amount of serous drainage noted. The wound margin is distinct with the outline attached to the wound base. There is no granulation within the wound bed. There is a large (67- 100%) amount of necrotic tissue within the wound bed including Adherent Slough. The periwound skin appearance exhibited: Induration, Maceration, Rubor, Erythema. The surrounding wound skin color is noted with erythema which is circumferential. Periwound temperature was noted as No Abnormality. The periwound has tenderness on palpation. Assessment Active Problems ICD-10 E11.621 - Type 2 diabetes mellitus with foot ulcer L97.312 - Non-pressure chronic ulcer of right ankle with fat layer exposed N18.6 - End stage renal disease Z99.2 - Dependence on renal dialysis L97.512 - Non-pressure chronic ulcer of other part of right foot with fat layer exposed Plan Wound Cleansing: Wound #13 Right,Medial Foot: Clean wound with Normal Saline. Cleanse wound with mild soap and water May Shower, gently pat wound dry prior to applying new dressing. Anesthetic: Wound #13 Right,Medial Foot: Topical Lidocaine 4% cream applied to wound bed prior to debridement - for clinic use Skin Barriers/Peri-Wound Care: Wound #13 Right,Medial Foot: Skin Prep Primary Wound  Dressing: Wound #13 Right,Medial Foot: Prisma Ag - moisten with saline before applying Secondary Dressing: Wound #13 Right,Medial Foot: Dry Gauze Boardered Foam Dressing Dressing Change Frequency: Wound #13 Right,Medial Foot: Three times weekly Follow-up Appointments: BIVIANA, SADDLER ([id number]) Wound #13 Right,Medial Foot: Return Appointment in 1 week. Edema Control: Wound #13 Right,Medial Foot: Elevate legs to the level of the heart and pump ankles as often as possible Oxygen Administration: While patient is in clinic, provide supplimental oxygen via nasal cannula at liters/min __ : - 2 LPM via [place] after review -- no sharp debridement required today, and I have recommended: 1. Prisma Ag locally to be applied daily with a bordered foam, the wound on her right medial foot. 2. bordered foam to the right lateral malleolus 3. Offloading has been discussed with her 4. adequate protein, vitamin A, vitamin C and zinc Electronic Signature(s) Signed: 04/07/2017 4:09:30 PM By: [name], [name] Entered By: [name] on 04/07/2017 15:06:00 Santiesteban, [name] [name] ([id number]) -------------------------------------------------------------------------------- SuperBill Details [name] [name]. Date of Service: 04/07/2017 Medical Record Number: [id number] Patient Account Number: [id number] Date of Birth/Sex: 07/26/1955 (61 y.o. Female) Treating RN: Carolyne Fiscal, Debi Primary Care Provider: [name] Other Clinician: Referring Provider: [name] Treating Provider/Extender: Frann Rider in Treatment: 25 Diagnosis Coding ICD-10 Codes Code Description E11.621 Type 2 diabetes mellitus with foot ulcer L97.312 Non-pressure chronic ulcer of right ankle with fat layer exposed N18.6 End stage renal disease Z99.2 Dependence on renal dialysis L97.512 Non-pressure chronic ulcer of other part of right foot with fat layer exposed Physician Procedures CPT4 Code Description:  [id number] - WC PHYS LEVEL 3 - EST PT ICD-10 Description Diagnosis E11.621 Type 2 diabetes mellitus with foot ulcer L97.312 Non-pressure chronic ulcer of right ankle with fat l Z99.2 Dependence on renal dialysis L97.512  Non-pressure chronic ulcer of other part of right fo Modifier: ayer exposed ot with fat lay Quantity: 1 er  exposed Electronic Signature(s) Signed: 04/07/2017 4:09:30 PM By: [name], [name] Entered By: [name] on 04/07/2017 09:19:08

## 2017-04-09 NOTE — Progress Notes (Signed)
SAHRA, CONVERSE ([id number]) Visit Report for 04/07/2017 Arrival Information Details [name], [name]. Date of Service: 04/07/2017 8:45 AM Medical Record Number: [id number] Patient Account Number: [id number] Date of Birth/Sex: [birth year] (61 y.o. Female) Treating RN: Carolyne Fiscal, Debi Primary Care Bryar Dahms: [name] Other Clinician: Referring Suhailah Kwan: [name] Treating Fiza Nation/Extender: Frann Rider in Treatment: 25 Visit Information History Since Last Visit All ordered tests and consults were completed: No Patient Arrived: Wheel Chair Added or deleted any medications: No Arrival Time: 08:40 Any new allergies or adverse reactions: No Accompanied By: self Had a fall or experienced change in No Transfer Assistance: EasyPivot Patient activities of daily living that may affect Lift risk of falls: Patient Identification Verified: Yes Signs or symptoms of abuse/neglect since last No Secondary Verification Process Yes visito Completed: Hospitalized since last visit: No Patient Requires Transmission- No Has Dressing in Place as Prescribed: Yes Based Precautions: Pain Present Now: No Patient Has Alerts: Yes Patient Alerts: Patient on Blood Thinner Electronic Signature(s) Signed: 04/07/2017 5:11:04 PM By: Alric Quan Entered By: Alric Quan on 04/07/2017 08:43:50 Brueckner, [name] ([id number]) -------------------------------------------------------------------------------- Clinic Level of Care Assessment Details [name]. Date of Service: 04/07/2017 8:45 AM Medical Record Number: [id number] Patient Account Number: [id number] Date of Birth/Sex: 08/30/1955 (61 y.o. Female) Treating RN: Carolyne Fiscal, Debi Primary Care Jovannie Ulibarri: [name] Other Clinician: Referring Raejean Swinford: [name] Treating Sanaia Jasso/Extender: Frann Rider in Treatment: 25 Clinic Level of Care Assessment Items TOOL 4 Quantity Score X - Use when  only an EandM is performed on FOLLOW-UP visit 1 0 ASSESSMENTS - Nursing Assessment / Reassessment X - Reassessment of Co-morbidities (includes updates in patient status) 1 10 X - Reassessment of Adherence to Treatment Plan 1 5 ASSESSMENTS - Wound and Skin Assessment / Reassessment X - Simple Wound Assessment / Reassessment - one wound 1 5 []  - Complex Wound Assessment / Reassessment - multiple wounds 0 []  - Dermatologic / Skin Assessment (not related to wound area) 0 ASSESSMENTS - Focused Assessment []  - Circumferential Edema Measurements - multi extremities 0 []  - Nutritional Assessment / Counseling / Intervention 0 []  - Lower Extremity Assessment (monofilament, tuning fork, pulses) 0 []  - Peripheral Arterial Disease Assessment (using hand held doppler) 0 ASSESSMENTS - Ostomy and/or Continence Assessment and Care []  - Incontinence Assessment and Management 0 []  - Ostomy Care Assessment and Management (repouching, etc.) 0 PROCESS - Coordination of Care []  - Simple Patient / Family Education for ongoing care 0 X - Complex (extensive) Patient / Family Education for ongoing care 1 20 X - Staff obtains Programmer, systems, Records, Test Results / Process Orders 1 10 X - Staff telephones HHA, Nursing Homes / Clarify orders / etc 1 10 []  - Routine Transfer to another Facility (non-emergent condition) 0 [name], [name] ([id number]) []  - Routine Hospital Admission (non-emergent condition) 0 []  - New Admissions / Biomedical engineer / Ordering NPWT, Apligraf, etc. 0 []  - Emergency Hospital Admission (emergent condition) 0 X - Simple Discharge Coordination 1 10 []  - Complex (extensive) Discharge Coordination 0 PROCESS - Special Needs []  - Pediatric / Minor Patient Management 0 []  - Isolation Patient Management 0 []  - Hearing / Language / Visual special needs 0 []  - Assessment of Community assistance (transportation, D/C planning, etc.) 0 []  - Additional assistance / Altered mentation 0 []  - Support  Surface(s) Assessment (bed, cushion, seat, etc.) 0 INTERVENTIONS - Wound Cleansing / Measurement X - Simple Wound Cleansing - one wound 1 5 []  - Complex  Wound Cleansing - multiple wounds 0 X - Wound Imaging (photographs - any number of wounds) 1 5 []  - Wound Tracing (instead of photographs) 0 X - Simple Wound Measurement - one wound 1 5 []  - Complex Wound Measurement - multiple wounds 0 INTERVENTIONS - Wound Dressings X - Small Wound Dressing one or multiple wounds 1 10 []  - Medium Wound Dressing one or multiple wounds 0 []  - Large Wound Dressing one or multiple wounds 0 X - Application of Medications - topical 1 5 []  - Application of Medications - injection 0 INTERVENTIONS - Miscellaneous []  - External ear exam 0 [name], [name] ([id number]) []  - Specimen Collection (cultures, biopsies, blood, body fluids, etc.) 0 []  - Specimen(s) / Culture(s) sent or taken to Lab for analysis 0 []  - Patient Transfer (multiple staff / Harrel Lemon Lift / Similar devices) 0 []  - Simple Staple / Suture removal (25 or less) 0 []  - Complex Staple / Suture removal (26 or more) 0 []  - Hypo / Hyperglycemic Management (close monitor of Blood Glucose) 0 []  - Ankle / Brachial Index (ABI) - do not check if billed separately 0 X - Vital Signs 1 5 Has the patient been seen at the hospital within the last three years: Yes Total Score: 105 Level Of Care: New/Established - Level 3 Electronic Signature(s) Signed: 04/07/2017 5:11:04 PM By: Alric Quan Entered By: Alric Quan on 04/07/2017 11:03:40 [name], [name] ([id number]) -------------------------------------------------------------------------------- Encounter Discharge Information Details Patient Name: VXYIA, [name] Date of Service: 04/07/2017 8:45 AM Medical Record Number: [id number] Patient Account Number: [id number] Date of Birth/Sex: 10-20-1955 (61 y.o. Female) Treating RN: Carolyne Fiscal, Debi Primary Care Zaahir Pickney: [name] Other  Clinician: Referring Dorsey Charette: [name] Treating Kelbie Moro/Extender: Frann Rider in Treatment: 25 Encounter Discharge Information Items Discharge Pain Level: 0 Discharge Condition: Stable Ambulatory Status: Wheelchair Discharge Destination: Nursing Home Transportation: Private Auto Accompanied By: self Schedule Follow-up Appointment: Yes Medication Reconciliation completed and provided to Patient/Care No Rey Dansby: Provided on Clinical Summary of Care: 04/07/2017 Form Type Recipient Paper Patient LD Electronic Signature(s) Signed: 04/07/2017 5:11:04 PM By: Alric Quan Entered By: Alric Quan on 04/07/2017 09:10:14 Landau, [name] ([id number]) -------------------------------------------------------------------------------- Lower Extremity Assessment Details Patient Name: BEEFE, Breawna S. Date of Service: 04/07/2017 8:45 AM Medical Record Number: [id number] Patient Account Number: [id number] Date of Birth/Sex: [birth year] (61 y.o. Female) Treating RN: Carolyne Fiscal, Debi Primary Care Yesika Rispoli: [name] Other Clinician: Referring Amauri Keefe: [name] Treating Deanne Bedgood/Extender: Frann Rider in Treatment: 25 Edema Assessment Assessed: [Left: No] [Right: No] Edema: [Left: N] [Right: o] Vascular Assessment Pulses: Dorsalis Pedis Palpable: [Right:Yes] Posterior Tibial Extremity colors, hair growth, and conditions: Extremity Color: [Right:Normal] Temperature of Extremity: [Right:Warm] Capillary Refill: [Right:< 3 seconds] Toe Nail Assessment Left: Right: Thick: No Discolored: No Deformed: No Improper Length and Hygiene: No Electronic Signature(s) Signed: 04/07/2017 5:11:04 PM By: Alric Quan Entered By: Alric Quan on 04/07/2017 08:53:25 Wan, Lateefah S. ([id number]) -------------------------------------------------------------------------------- Multi Wound Chart Details Patient Name: EBRAX, Sandra S. Date of Service: 04/07/2017 8:45  AM Medical Record Number: [id number] Patient Account Number: [id number] Date of Birth/Sex: 1955-10-09 (61 y.o. 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] Comorbid History: [13:Glaucoma, Anemia, Coronary Artery Disease, Hypertension, Type II Diabetes, End Stage Renal Disease, Osteoarthritis, Neuropathy] [N/A:N/A] Date Acquired: [13:12/14/2016] [N/A:N/A] Weeks of Treatment: [13:7] [N/A:N/A] Wound Status: [13:Open] [N/A:N/A] Measurements L x W x D 0.5x0.9x0.2 [N/A:N/A] (cm) Area (cm) : [13:0.353] [N/A:N/A] Volume (cm) : [13:0.071] [N/A:N/A] % Reduction in Area: [13:50.10%] [N/A:N/A] % Reduction in Volume: 49.60% [N/A:N/A] Classification: [13:Grade 1] [N/A:N/A] Exudate Amount: [13:Large] [N/A:N/A] Exudate Type: [13:Serous] [N/A:N/A] Exudate Color: [13:amber] [N/A:N/A] Wound Margin: [13:Distinct, outline attached N/A] Granulation Amount: [13:None Present (0%)] [N/A:N/A] Necrotic Amount: [13:Large (67-100%)] [N/A:N/A] Exposed Structures: [13:Fat Layer (Subcutaneous N/A Tissue) Exposed: Yes] Epithelialization: [13:None] [N/A:N/A] Periwound Skin Texture: Induration: Yes N/A N/A Periwound Skin Maceration: Yes N/A N/A Moisture: Periwound Skin Color: Erythema: Yes N/A N/A Rubor: Yes Erythema Location: Circumferential N/A N/A Temperature: No Abnormality N/A N/A Tenderness on Yes N/A N/A Palpation: Wound Preparation: Ulcer Cleansing: N/A N/A Rinsed/Irrigated  with Saline Topical Anesthetic Applied: Other: lidocaine 4% Treatment Notes Wound #13 (Right, Medial Foot) 1. Cleansed with: Clean wound with Normal Saline 2. Anesthetic Topical Lidocaine 4% cream to wound bed prior to debridement 4. Dressing Applied: Prisma Ag 5. Secondary Dressing Applied Bordered Foam Dressing Dry Gauze Electronic Signature(s) Signed: 04/07/2017 4:09:30 PM By: [name], [name] Entered By: [name] on 04/07/2017 09:17:10 Gambrill, [name] ([id number]) -------------------------------------------------------------------------------- [place] Details [name], [name]. Date of Service: 04/07/2017 8:45 AM Medical Record Number: [id number] Patient Account Number: [id number] Date of Birth/Sex: 09-24-1955 (61 y.o. Female) Treating RN: Carolyne Fiscal, Debi Primary Care Davy Faught: [name] Other Clinician: Referring Aziz Slape: [name] Treating Lorrie Gargan/Extender: Frann Rider in Treatment: 25 Active Inactive ` Abuse / Safety / Falls / Self Care Management Nursing Diagnoses: Potential for falls Goals: Patient will remain injury free Date Initiated: 10/10/2016 Target Resolution Date: 12/23/2016 Goal Status: Active Interventions: Assess fall risk on admission and as needed Notes: ` Nutrition Nursing Diagnoses: Potential for alteratiion in Nutrition/Potential for imbalanced nutrition Goals: Patient/caregiver agrees to and verbalizes understanding of need to use nutritional supplements and/or vitamins as prescribed Date Initiated: 10/10/2016 Target Resolution Date: 10/21/2016 Goal Status: Active Interventions: Assess patient nutrition upon admission and as needed per policy Notes: ` Orientation to the Wound Care Program Nursing Diagnoses: Knowledge deficit related to the wound healing center program Ates, [name] ([id number]) Goals: Patient/caregiver will verbalize understanding of the [place] Date Initiated: 10/10/2016 Target Resolution Date: 12/23/2016 Goal Status: Active Interventions: Provide education on orientation to the wound center Notes: ` Wound/Skin Impairment Nursing Diagnoses: Impaired tissue integrity Goals: Patient/caregiver will verbalize understanding of skin care regimen Date Initiated: 10/10/2016 Target Resolution Date: 12/23/2016 Goal Status: Active Ulcer/skin breakdown will have a volume reduction of 30% by week 4 Date Initiated: 10/10/2016 Target Resolution Date: 12/23/2016 Goal Status: Active Ulcer/skin breakdown will have a volume reduction of 50% by week 8 Date Initiated: 10/10/2016 Target Resolution Date: 12/23/2016 Goal Status: Active Ulcer/skin breakdown will have a volume reduction of 80% by week 12 Date Initiated: 10/10/2016 Target Resolution Date: 12/23/2016 Goal Status: Active Ulcer/skin breakdown will heal within 14 weeks Date Initiated: 10/10/2016 Target Resolution Date: 12/23/2016 Goal Status: Active Interventions: Assess patient/caregiver ability to obtain necessary supplies Assess patient/caregiver ability to perform ulcer/skin care regimen upon admission and as needed Assess ulceration(s) every visit Notes: Electronic Signature(s) Signed: 04/07/2017 5:11:04 PM By: Alric Quan Entered By: Alric Quan on 04/07/2017 08:53:30 Dutko, [name] ([id number]) -------------------------------------------------------------------------------- Pain Assessment Details [name]. Date of Service: 04/07/2017 8:45 AM  Medical Record Number: [id number] Patient Account Number: [id number] Date of Birth/Sex: [birth year] (61 y.o. Female) Treating RN: Carolyne Fiscal, Debi Primary Care Marrisa Kimber: [name] Other Clinician: Referring Adreonna Yontz: [name] Treating Patrese Neal/Extender: Frann Rider in Treatment: 25 Active Problems Location of Pain Severity and Description of Pain Patient Has Paino No Site Locations Pain  Management and Medication Current Pain Management: Electronic Signature(s) Signed: 04/07/2017 5:11:04 PM By: Alric Quan Entered By: Alric Quan on 04/07/2017 08:43:55 [place], [name] ([id number]) -------------------------------------------------------------------------------- Patient/Caregiver Education Details [name]. Date of Service: 04/07/2017 8:45 AM Medical Record Number: [id number] Patient Account Number: [id number] Date of Birth/Gender: [birth year] (61 y.o. Female) Treating RN: Carolyne Fiscal, Debi Primary Care Physician: [name] Other Clinician: Referring Physician: [name] Treating Physician/Extender: Frann Rider in Treatment: 25 Education Assessment Education Provided To: Patient Education Topics Provided Wound/Skin Impairment: Handouts: Other: change dressing as ordered Methods: Demonstration, Explain/Verbal Responses: State content correctly Electronic Signature(s) Signed: 04/07/2017 5:11:04 PM By: Alric Quan Entered By: Alric Quan on 04/07/2017 09:10:28 Ramnauth, [name] ([id number]) -------------------------------------------------------------------------------- Wound Assessment Details Patient Name: Moster, Dayanis S. Date of Service: 04/07/2017 8:45 AM Medical Record Number: [id number] Patient Account Number: [id number] Date of Birth/Sex: September 27, 1955 (61 y.o. Female) Treating RN: Carolyne Fiscal, Debi Primary Care Laronda Lisby: [name] Other Clinician: Referring Ryli Standlee: [name] Treating Katyana Trolinger/Extender: Frann Rider in Treatment: 25 Wound Status Wound Number: 13 Primary Diabetic Wound/Ulcer of the Lower Etiology: Extremity Wound Location: Right Foot - Medial Wound Open Wounding Event: Gradually Appeared Status: Date Acquired: 12/14/2016 Comorbid Glaucoma, Anemia, Coronary Artery Weeks Of Treatment: 7 History: Disease, Hypertension, Type II Clustered Wound: No Diabetes, End Stage Renal  Disease, Osteoarthritis, Neuropathy Wound Measurements Length: (cm) 0.5 Width: (cm) 0.9 Depth: (cm) 0.2 Area: (cm) 0.353 Volume: (cm) 0.071 % Reduction in Area: 50.1% % Reduction in Volume: 49.6% Epithelialization: None Tunneling: No Undermining: No Wound Description Classification: Grade 1 Foul Odor Aft Wound Margin: Distinct, outline attached Slough/Fibrin Exudate Amount: Large Exudate Type: Serous Exudate Color: amber er Cleansing: No o Yes Wound Bed Granulation Amount: None Present (0%) Exposed Structure Necrotic Amount: Large (67-100%) Fat Layer (Subcutaneous Tissue) Exposed: Yes Necrotic Quality: Adherent Slough Periwound Skin Texture Texture Color No Abnormalities Noted: No No Abnormalities Noted: No Induration: Yes Erythema: Yes Erythema Location: Circumferential Moisture Rubor: Yes No Abnormalities Noted: No Maceration: Yes Temperature / Pain Temperature: No Abnormality Tenderness on Palpation: Yes Wound Preparation Kihn, Violeta S. ([id number]) Ulcer Cleansing: Rinsed/Irrigated with Saline Topical Anesthetic Applied: Other: lidocaine 4%, Treatment Notes Wound #13 (Right, Medial Foot) 1. Cleansed with: Clean wound with Normal Saline 2. Anesthetic Topical Lidocaine 4% cream to wound bed prior to debridement 4. Dressing Applied: Prisma Ag 5. Secondary Dressing Applied Bordered Foam Dressing Dry Gauze Electronic Signature(s) Signed: 04/07/2017 5:11:04 PM By: Alric Quan Entered By: Alric Quan on 04/07/2017 08:52:50 Christopherson, [name] ([id number]) -------------------------------------------------------------------------------- [place] Details [name]. Date of Service: 04/07/2017 8:45 AM Medical Record Number: [id number] Patient Account Number: [id number] Date of Birth/Sex: 1956-01-31 (61 y.o. Female) Treating RN: Carolyne Fiscal, Debi Primary Care Gail Vendetti: [name] Other Clinician: Referring Nachum Derossett: [name] Treating Odette Watanabe/Extender: Frann Rider in Treatment: 25 Vital Signs Time Taken: 08:43 Temperature (F): 98.1 Height (in): 61 Pulse (bpm): 76 Weight (lbs): 160 Respiratory Rate (breaths/min): 18 Body Mass Index (BMI): 30.2 Blood Pressure (mmHg): 154/58 Reference Range: 80 - 120 mg / dl Electronic Signature(s) Signed: 04/07/2017 5:11:04 PM By: Alric Quan Entered By: Alric Quan on 04/07/2017 08:44:31

## 2017-04-20 ENCOUNTER — Encounter (INDEPENDENT_AMBULATORY_CARE_PROVIDER_SITE_OTHER): Payer: Self-pay | Admitting: Vascular Surgery

## 2017-04-26 ENCOUNTER — Emergency Department: Payer: Medicare Other

## 2017-04-26 ENCOUNTER — Encounter: Payer: Self-pay | Admitting: Emergency Medicine

## 2017-04-26 ENCOUNTER — Inpatient Hospital Stay
Admission: EM | Admit: 2017-04-26 | Discharge: 2017-04-30 | DRG: 291 | Disposition: A | Discharge status: Skilled Nursing Facility | Payer: Medicare Other | Attending: Internal Medicine | Admitting: Internal Medicine

## 2017-04-26 DIAGNOSIS — E1143 Type 2 diabetes mellitus with diabetic autonomic (poly)neuropathy: Secondary | ICD-10-CM | POA: Diagnosis present

## 2017-04-26 DIAGNOSIS — E1142 Type 2 diabetes mellitus with diabetic polyneuropathy: Secondary | ICD-10-CM | POA: Diagnosis present

## 2017-04-26 DIAGNOSIS — J90 Pleural effusion, not elsewhere classified: Secondary | ICD-10-CM

## 2017-04-26 DIAGNOSIS — I132 Hypertensive heart and chronic kidney disease with heart failure and with stage 5 chronic kidney disease, or end stage renal disease: Principal | ICD-10-CM | POA: Diagnosis present

## 2017-04-26 DIAGNOSIS — Z992 Dependence on renal dialysis: Secondary | ICD-10-CM | POA: Diagnosis not present

## 2017-04-26 DIAGNOSIS — J449 Chronic obstructive pulmonary disease, unspecified: Secondary | ICD-10-CM | POA: Diagnosis present

## 2017-04-26 DIAGNOSIS — I5023 Acute on chronic systolic (congestive) heart failure: Secondary | ICD-10-CM

## 2017-04-26 DIAGNOSIS — D631 Anemia in chronic kidney disease: Secondary | ICD-10-CM | POA: Diagnosis present

## 2017-04-26 DIAGNOSIS — R0602 Shortness of breath: Secondary | ICD-10-CM

## 2017-04-26 DIAGNOSIS — I5030 Unspecified diastolic (congestive) heart failure: Secondary | ICD-10-CM

## 2017-04-26 DIAGNOSIS — Z79899 Other long term (current) drug therapy: Secondary | ICD-10-CM | POA: Diagnosis not present

## 2017-04-26 DIAGNOSIS — E1122 Type 2 diabetes mellitus with diabetic chronic kidney disease: Secondary | ICD-10-CM | POA: Diagnosis present

## 2017-04-26 DIAGNOSIS — Z794 Long term (current) use of insulin: Secondary | ICD-10-CM

## 2017-04-26 DIAGNOSIS — I2510 Atherosclerotic heart disease of native coronary artery without angina pectoris: Secondary | ICD-10-CM | POA: Diagnosis present

## 2017-04-26 DIAGNOSIS — I5043 Acute on chronic combined systolic (congestive) and diastolic (congestive) heart failure: Secondary | ICD-10-CM | POA: Diagnosis present

## 2017-04-26 DIAGNOSIS — I252 Old myocardial infarction: Secondary | ICD-10-CM

## 2017-04-26 DIAGNOSIS — K219 Gastro-esophageal reflux disease without esophagitis: Secondary | ICD-10-CM | POA: Diagnosis present

## 2017-04-26 DIAGNOSIS — Z7982 Long term (current) use of aspirin: Secondary | ICD-10-CM

## 2017-04-26 DIAGNOSIS — J9621 Acute and chronic respiratory failure with hypoxia: Secondary | ICD-10-CM | POA: Diagnosis present

## 2017-04-26 DIAGNOSIS — N2889 Other specified disorders of kidney and ureter: Secondary | ICD-10-CM | POA: Diagnosis present

## 2017-04-26 DIAGNOSIS — E039 Hypothyroidism, unspecified: Secondary | ICD-10-CM | POA: Diagnosis present

## 2017-04-26 DIAGNOSIS — I480 Paroxysmal atrial fibrillation: Secondary | ICD-10-CM | POA: Diagnosis present

## 2017-04-26 DIAGNOSIS — N186 End stage renal disease: Secondary | ICD-10-CM

## 2017-04-26 DIAGNOSIS — I509 Heart failure, unspecified: Secondary | ICD-10-CM

## 2017-04-26 DIAGNOSIS — Z66 Do not resuscitate: Secondary | ICD-10-CM | POA: Diagnosis present

## 2017-04-26 DIAGNOSIS — H409 Unspecified glaucoma: Secondary | ICD-10-CM | POA: Diagnosis present

## 2017-04-26 DIAGNOSIS — N2581 Secondary hyperparathyroidism of renal origin: Secondary | ICD-10-CM | POA: Diagnosis present

## 2017-04-26 LAB — URINALYSIS, COMPLETE (UACMP) WITH MICROSCOPIC
Bilirubin Urine: NEGATIVE
GLUCOSE, UA: NEGATIVE mg/dL
HGB URINE DIPSTICK: NEGATIVE
KETONES UR: 5 mg/dL — AB
Leukocytes, UA: NEGATIVE
NITRITE: NEGATIVE
PH: 7 (ref 5.0–8.0)
Specific Gravity, Urine: 1.017 (ref 1.005–1.030)

## 2017-04-26 LAB — GLUCOSE, CAPILLARY
GLUCOSE-CAPILLARY: 146 mg/dL — AB (ref 65–99)
Glucose-Capillary: 149 mg/dL — ABNORMAL HIGH (ref 65–99)

## 2017-04-26 LAB — BLOOD GAS, VENOUS
Acid-Base Excess: 10.1 mmol/L — ABNORMAL HIGH (ref 0.0–2.0)
Bicarbonate: 36.1 mmol/L — ABNORMAL HIGH (ref 20.0–28.0)
PATIENT TEMPERATURE: 37
pCO2, Ven: 57 mmHg (ref 44.0–60.0)
pH, Ven: 7.41 (ref 7.250–7.430)

## 2017-04-26 LAB — BASIC METABOLIC PANEL
Anion gap: 10 (ref 5–15)
BUN: 24 mg/dL — AB (ref 6–20)
CALCIUM: 8.9 mg/dL (ref 8.9–10.3)
CO2: 31 mmol/L (ref 22–32)
Chloride: 98 mmol/L — ABNORMAL LOW (ref 101–111)
Creatinine, Ser: 3.28 mg/dL — ABNORMAL HIGH (ref 0.44–1.00)
GFR calc Af Amer: 16 mL/min — ABNORMAL LOW (ref >60)
GFR, EST NON AFRICAN AMERICAN: 14 mL/min — AB (ref >60)
GLUCOSE: 87 mg/dL (ref 65–99)
Potassium: 3.3 mmol/L — ABNORMAL LOW (ref 3.5–5.1)
Sodium: 139 mmol/L (ref 135–145)

## 2017-04-26 LAB — CBC WITH DIFFERENTIAL/PLATELET
BASOS ABS: 0.1 10*3/uL (ref 0–0.1)
Basophils Relative: 1 %
EOS PCT: 9 %
Eosinophils Absolute: 0.8 10*3/uL — ABNORMAL HIGH (ref 0–0.7)
HCT: 24.4 % — ABNORMAL LOW (ref 35.0–47.0)
Hemoglobin: 8.2 g/dL — ABNORMAL LOW (ref 12.0–16.0)
Lymphocytes Relative: 19 %
Lymphs Abs: 1.6 10*3/uL (ref 1.0–3.6)
MCH: 30.5 pg (ref 26.0–34.0)
MCHC: 33.8 g/dL (ref 32.0–36.0)
MCV: 90.3 fL (ref 80.0–100.0)
MONO ABS: 0.4 10*3/uL (ref 0.2–0.9)
Monocytes Relative: 5 %
Neutro Abs: 5.6 10*3/uL (ref 1.4–6.5)
Neutrophils Relative %: 66 %
PLATELETS: 158 10*3/uL (ref 150–440)
RBC: 2.7 MIL/uL — ABNORMAL LOW (ref 3.80–5.20)
RDW: 16.1 % — AB (ref 11.5–14.5)
WBC: 8.5 10*3/uL (ref 3.6–11.0)

## 2017-04-26 LAB — LIPASE, BLOOD: Lipase: 15 U/L (ref 11–51)

## 2017-04-26 LAB — TROPONIN I: Troponin I: 0.05 ng/mL (ref <0.03)

## 2017-04-26 LAB — BRAIN NATRIURETIC PEPTIDE: B Natriuretic Peptide: >4500 pg/mL — ABNORMAL HIGH (ref 0.0–100.0)

## 2017-04-26 LAB — PHOSPHORUS: PHOSPHORUS: 3.9 mg/dL (ref 2.5–4.6)

## 2017-04-26 MED ORDER — HYDRALAZINE HCL 25 MG PO TABS
100.0000 mg | ORAL_TABLET | Freq: Three times a day (TID) | ORAL | Status: DC
  Administered 2017-04-26 – 2017-04-30 (×10): 100 mg via ORAL
  Filled 2017-04-26 (×11): qty 4

## 2017-04-26 MED ORDER — FLUTICASONE PROPIONATE 50 MCG/ACT NA SUSP
2.0000 | Freq: Two times a day (BID) | NASAL | Status: DC
  Administered 2017-04-27 – 2017-04-30 (×6): 2 via NASAL
  Filled 2017-04-26: qty 16

## 2017-04-26 MED ORDER — SODIUM CHLORIDE 0.9 % IV SOLN: 100.0000 mL | INTRAVENOUS | Status: DC | PRN

## 2017-04-26 MED ORDER — HEPARIN SODIUM (PORCINE) 1000 UNIT/ML DIALYSIS
1000.0000 [IU] | INTRAMUSCULAR | Status: DC | PRN
  Filled 2017-04-26: qty 1

## 2017-04-26 MED ORDER — HYDROCODONE-ACETAMINOPHEN 5-325 MG PO TABS: 1.0000 | ORAL_TABLET | Freq: Four times a day (QID) | ORAL | Status: DC | PRN

## 2017-04-26 MED ORDER — LABETALOL HCL 100 MG PO TABS
300.0000 mg | ORAL_TABLET | Freq: Three times a day (TID) | ORAL | Status: DC
  Administered 2017-04-26 – 2017-04-30 (×10): 300 mg via ORAL
  Filled 2017-04-26 (×8): qty 3
  Filled 2017-04-26: qty 1
  Filled 2017-04-26: qty 3
  Filled 2017-04-26: qty 1
  Filled 2017-04-26 (×3): qty 3

## 2017-04-26 MED ORDER — AMLODIPINE BESYLATE 5 MG PO TABS
5.0000 mg | ORAL_TABLET | Freq: Every day | ORAL | Status: DC
  Administered 2017-04-27 – 2017-04-30 (×4): 5 mg via ORAL
  Filled 2017-04-26 (×4): qty 1

## 2017-04-26 MED ORDER — INSULIN ASPART 100 UNIT/ML ~~LOC~~ SOLN
0.0000 [IU] | Freq: Every day | SUBCUTANEOUS | Status: DC
  Administered 2017-04-28: 4 [IU] via SUBCUTANEOUS
  Administered 2017-04-29: 2 [IU] via SUBCUTANEOUS
  Filled 2017-04-26 (×2): qty 1

## 2017-04-26 MED ORDER — PANTOPRAZOLE SODIUM 40 MG PO TBEC
40.0000 mg | DELAYED_RELEASE_TABLET | Freq: Every day | ORAL | Status: DC
  Administered 2017-04-28 – 2017-04-30 (×3): 40 mg via ORAL
  Filled 2017-04-26 (×3): qty 1

## 2017-04-26 MED ORDER — LIDOCAINE HCL (PF) 1 % IJ SOLN
5.0000 mL | INTRAMUSCULAR | Status: DC | PRN
  Filled 2017-04-26: qty 5

## 2017-04-26 MED ORDER — TIOTROPIUM BROMIDE MONOHYDRATE 18 MCG IN CAPS
1.0000 | ORAL_CAPSULE | Freq: Every evening | RESPIRATORY_TRACT | Status: DC
  Administered 2017-04-28: 18 ug via RESPIRATORY_TRACT
  Filled 2017-04-26: qty 5

## 2017-04-26 MED ORDER — ALBUTEROL SULFATE (2.5 MG/3ML) 0.083% IN NEBU
2.5000 mg | INHALATION_SOLUTION | RESPIRATORY_TRACT | Status: DC | PRN
  Filled 2017-04-26: qty 3

## 2017-04-26 MED ORDER — METOCLOPRAMIDE HCL 5 MG PO TABS
5.0000 mg | ORAL_TABLET | Freq: Every day | ORAL | Status: DC
  Administered 2017-04-27 – 2017-04-29 (×4): 5 mg via ORAL
  Filled 2017-04-26 (×5): qty 1

## 2017-04-26 MED ORDER — HEPARIN SODIUM (PORCINE) 5000 UNIT/ML IJ SOLN
5000.0000 [IU] | Freq: Three times a day (TID) | INTRAMUSCULAR | Status: DC
  Administered 2017-04-26 – 2017-04-30 (×9): 5000 [IU] via SUBCUTANEOUS
  Filled 2017-04-26 (×10): qty 1

## 2017-04-26 MED ORDER — MAGNESIUM OXIDE 400 (241.3 MG) MG PO TABS
400.0000 mg | ORAL_TABLET | Freq: Two times a day (BID) | ORAL | Status: DC
  Administered 2017-04-27 – 2017-04-30 (×7): 400 mg via ORAL
  Filled 2017-04-26 (×9): qty 1

## 2017-04-26 MED ORDER — SODIUM CHLORIDE 0.9% FLUSH
3.0000 mL | Freq: Two times a day (BID) | INTRAVENOUS | Status: DC
  Administered 2017-04-26 – 2017-04-30 (×8): 3 mL via INTRAVENOUS

## 2017-04-26 MED ORDER — PREGABALIN 75 MG PO CAPS
75.0000 mg | ORAL_CAPSULE | Freq: Every day | ORAL | Status: DC
  Administered 2017-04-27 – 2017-04-29 (×4): 75 mg via ORAL
  Filled 2017-04-26 (×5): qty 1

## 2017-04-26 MED ORDER — SODIUM CHLORIDE 0.9% FLUSH: 3.0000 mL | INTRAVENOUS | Status: DC | PRN

## 2017-04-26 MED ORDER — LORATADINE 10 MG PO TABS
10.0000 mg | ORAL_TABLET | ORAL | Status: DC
  Administered 2017-04-27 – 2017-04-29 (×2): 10 mg via ORAL
  Filled 2017-04-26 (×3): qty 1

## 2017-04-26 MED ORDER — ROPINIROLE HCL 0.25 MG PO TABS
0.2500 mg | ORAL_TABLET | Freq: Two times a day (BID) | ORAL | Status: DC
  Administered 2017-04-27 – 2017-04-30 (×7): 0.25 mg via ORAL
  Filled 2017-04-26 (×9): qty 1

## 2017-04-26 MED ORDER — ONDANSETRON HCL 4 MG/2ML IJ SOLN: 4.0000 mg | Freq: Four times a day (QID) | INTRAMUSCULAR | Status: DC | PRN

## 2017-04-26 MED ORDER — FLORA-Q PO CAPS
1.0000 | ORAL_CAPSULE | Freq: Every day | ORAL | Status: DC
  Filled 2017-04-26: qty 1

## 2017-04-26 MED ORDER — MORPHINE SULFATE (PF) 4 MG/ML IV SOLN
4.0000 mg | Freq: Once | INTRAVENOUS | Status: AC
  Administered 2017-04-26: 4 mg via INTRAVENOUS
  Filled 2017-04-26: qty 1

## 2017-04-26 MED ORDER — ARFORMOTEROL TARTRATE 15 MCG/2ML IN NEBU
15.0000 ug | INHALATION_SOLUTION | Freq: Two times a day (BID) | RESPIRATORY_TRACT | Status: DC
  Administered 2017-04-27 – 2017-04-30 (×7): 15 ug via RESPIRATORY_TRACT
  Filled 2017-04-26 (×9): qty 2

## 2017-04-26 MED ORDER — SODIUM CHLORIDE 0.9% FLUSH
3.0000 mL | Freq: Two times a day (BID) | INTRAVENOUS | Status: DC
  Administered 2017-04-27 – 2017-04-29 (×4): 3 mL via INTRAVENOUS

## 2017-04-26 MED ORDER — GUAIFENESIN ER 600 MG PO TB12
1200.0000 mg | ORAL_TABLET | Freq: Two times a day (BID) | ORAL | Status: DC
  Administered 2017-04-26 – 2017-04-30 (×7): 1200 mg via ORAL
  Filled 2017-04-26 (×8): qty 2

## 2017-04-26 MED ORDER — SALINE SPRAY 0.65 % NA SOLN
1.0000 | NASAL | Status: DC | PRN
  Filled 2017-04-26: qty 44

## 2017-04-26 MED ORDER — MENTHOL 3 MG MT LOZG
1.0000 | LOZENGE | Freq: Four times a day (QID) | OROMUCOSAL | Status: DC | PRN
  Filled 2017-04-26: qty 9

## 2017-04-26 MED ORDER — ACETAMINOPHEN 650 MG RE SUPP
650.0000 mg | Freq: Four times a day (QID) | RECTAL | Status: DC | PRN
Start: 2017-04-26 — End: 2017-04-30

## 2017-04-26 MED ORDER — LIDOCAINE-PRILOCAINE 2.5-2.5 % EX CREA: 1.0000 "application " | TOPICAL_CREAM | CUTANEOUS | Status: DC | PRN

## 2017-04-26 MED ORDER — FUROSEMIDE 40 MG PO TABS
80.0000 mg | ORAL_TABLET | Freq: Every day | ORAL | Status: DC
Start: 2017-04-27 — End: 2017-04-30
  Administered 2017-04-27 – 2017-04-30 (×4): 80 mg via ORAL
  Filled 2017-04-26 (×4): qty 2

## 2017-04-26 MED ORDER — TIZANIDINE HCL 4 MG PO TABS
4.0000 mg | ORAL_TABLET | Freq: Every day | ORAL | Status: DC
  Administered 2017-04-27 – 2017-04-29 (×4): 4 mg via ORAL
  Filled 2017-04-26 (×5): qty 1

## 2017-04-26 MED ORDER — SODIUM CHLORIDE 0.9 % IV SOLN: 250.0000 mL | INTRAVENOUS | Status: DC | PRN

## 2017-04-26 MED ORDER — POLYETHYLENE GLYCOL 3350 17 G PO PACK: 17.0000 g | PACK | Freq: Every day | ORAL | Status: DC | PRN

## 2017-04-26 MED ORDER — BUDESONIDE 0.25 MG/2ML IN SUSP
1.0000 mg | Freq: Two times a day (BID) | RESPIRATORY_TRACT | Status: DC
  Administered 2017-04-27: 1 mg via RESPIRATORY_TRACT
  Filled 2017-04-26 (×3): qty 8

## 2017-04-26 MED ORDER — INSULIN GLARGINE 100 UNIT/ML ~~LOC~~ SOLN
10.0000 [IU] | Freq: Every day | SUBCUTANEOUS | Status: DC
  Administered 2017-04-27: 10 [IU] via SUBCUTANEOUS
  Filled 2017-04-26 (×2): qty 0.1

## 2017-04-26 MED ORDER — INSULIN ASPART 100 UNIT/ML ~~LOC~~ SOLN
0.0000 [IU] | Freq: Three times a day (TID) | SUBCUTANEOUS | Status: DC
  Administered 2017-04-27: 2 [IU] via SUBCUTANEOUS
  Administered 2017-04-27: 5 [IU] via SUBCUTANEOUS
  Administered 2017-04-27: 7 [IU] via SUBCUTANEOUS
  Administered 2017-04-28: 3 [IU] via SUBCUTANEOUS
  Administered 2017-04-28: 5 [IU] via SUBCUTANEOUS
  Administered 2017-04-29: 2 [IU] via SUBCUTANEOUS
  Administered 2017-04-29: 3 [IU] via SUBCUTANEOUS
  Administered 2017-04-30 (×2): 5 [IU] via SUBCUTANEOUS
  Filled 2017-04-26 (×9): qty 1

## 2017-04-26 MED ORDER — ASPIRIN EC 81 MG PO TBEC
81.0000 mg | DELAYED_RELEASE_TABLET | Freq: Every day | ORAL | Status: DC
  Administered 2017-04-27 – 2017-04-30 (×4): 81 mg via ORAL
  Filled 2017-04-26 (×4): qty 1

## 2017-04-26 MED ORDER — PENTAFLUOROPROP-TETRAFLUOROETH EX AERO: 1.0000 "application " | INHALATION_SPRAY | CUTANEOUS | Status: DC | PRN

## 2017-04-26 MED ORDER — ACETAMINOPHEN 325 MG PO TABS: 650.0000 mg | ORAL_TABLET | Freq: Four times a day (QID) | ORAL | Status: DC | PRN

## 2017-04-26 MED ORDER — CALCIUM ACETATE (PHOS BINDER) 667 MG PO CAPS
2001.0000 mg | ORAL_CAPSULE | Freq: Three times a day (TID) | ORAL | Status: DC
  Administered 2017-04-27 – 2017-04-30 (×10): 2001 mg via ORAL
  Filled 2017-04-26 (×10): qty 3

## 2017-04-26 MED ORDER — HYPROMELLOSE (GONIOSCOPIC) 2.5 % OP SOLN
2.0000 [drp] | Freq: Four times a day (QID) | OPHTHALMIC | Status: DC | PRN
  Filled 2017-04-26: qty 15

## 2017-04-26 MED ORDER — FUROSEMIDE 10 MG/ML IJ SOLN
120.0000 mg | Freq: Once | INTRAMUSCULAR | Status: AC
  Administered 2017-04-26: 120 mg via INTRAVENOUS
  Filled 2017-04-26: qty 12

## 2017-04-26 MED ORDER — IOPAMIDOL (ISOVUE-370) INJECTION 76%
75.0000 mL | Freq: Once | INTRAVENOUS | Status: AC | PRN
  Administered 2017-04-26: 75 mL via INTRAVENOUS

## 2017-04-26 MED ORDER — DOCUSATE SODIUM 100 MG PO CAPS
200.0000 mg | ORAL_CAPSULE | Freq: Every day | ORAL | Status: DC
Start: 2017-04-27 — End: 2017-04-30
  Administered 2017-04-27 – 2017-04-30 (×4): 200 mg via ORAL
  Filled 2017-04-26 (×4): qty 2

## 2017-04-26 MED ORDER — ONDANSETRON HCL 4 MG PO TABS: 4.0000 mg | ORAL_TABLET | Freq: Four times a day (QID) | ORAL | Status: DC | PRN

## 2017-04-26 MED ORDER — ALTEPLASE 2 MG IJ SOLR
2.0000 mg | Freq: Once | INTRAMUSCULAR | Status: DC | PRN
  Filled 2017-04-26: qty 2

## 2017-04-26 MED ORDER — LABETALOL HCL 5 MG/ML IV SOLN: 10.0000 mg | INTRAVENOUS | Status: DC | PRN

## 2017-04-26 MED ORDER — LOSARTAN POTASSIUM 50 MG PO TABS
100.0000 mg | ORAL_TABLET | Freq: Every day | ORAL | Status: DC
  Administered 2017-04-28 – 2017-04-30 (×3): 100 mg via ORAL
  Filled 2017-04-26 (×3): qty 2

## 2017-04-26 MED ORDER — LEVOTHYROXINE SODIUM 50 MCG PO TABS
50.0000 ug | ORAL_TABLET | Freq: Every day | ORAL | Status: DC
  Administered 2017-04-26 – 2017-04-29 (×4): 50 ug via ORAL
  Filled 2017-04-26 (×5): qty 1

## 2017-04-26 MED ORDER — PREDNISONE 10 MG PO TABS
10.0000 mg | ORAL_TABLET | Freq: Every day | ORAL | Status: DC
  Administered 2017-04-27 – 2017-04-30 (×4): 10 mg via ORAL
  Filled 2017-04-26 (×4): qty 1

## 2017-04-26 NOTE — Progress Notes (Signed)
HD initiated via L AVF without issue. Patient in no distress. Sats stable on 3L [place]. Tx time 3 hours with 1.5L goal as ordered. Labs also sent as ordered. Continue to monitor patient.

## 2017-04-26 NOTE — H&P (Signed)
[facility] NAME: [name]    MR#:  [id number]  DATE OF BIRTH:  Sep 24, 1955  DATE OF ADMISSION:  04/26/2017  PRIMARY CARE PHYSICIAN: Marden Noble, MD   REQUESTING/REFERRING PHYSICIAN: Dr. Jimmye Norman  CHIEF COMPLAINT:   Chief Complaint  Patient presents with  . Shortness of Breath    HISTORY OF PRESENT ILLNESS:  [name]  is a 61 y.o. female with a known history of Chronic diastolic congestive heart failure, incisional disease, hypertension, diabetes, atrial fibrillation presents to the emergency room complaining of worsening shortness of breath since yesterday 5 PM. Patient had dialysis yesterday. Her dry weight or the amount of fluid removed yesterday is unknown. Today morning she got acutely short of breath and was sent to the emergency room. Here patient had a chest x-ray which showed bilateral pleural effusions with left larger than right. CT scan of the chest was checked which showed no PE but pulmonary edema and pleural effusions.  PAST MEDICAL HISTORY:   Past Medical History:  Diagnosis Date  . Atrial fibrillation ([place])   . CAD (coronary artery disease)   . CHF (congestive heart failure) ([place])   . CKD (chronic kidney disease)   . COPD (chronic obstructive pulmonary disease) ([place])   . Diabetes ([place])   . Diabetic peripheral neuropathy associated with type 2 diabetes mellitus ([place])   . Dialysis patient ([place])   . GERD (gastroesophageal reflux disease)   . HTN (hypertension)   . MI (myocardial infarction) ([place]) 05-17-2014  . Pancreatitis   . Peripheral autonomic neuropathy due to diabetes mellitus ([place])   . Renal insufficiency    Pt is on dialysis Tuesday, Thursday and Saturday.    PAST SURGICAL HISTORY:   Past Surgical History:  Procedure Laterality Date  . ABDOMINAL HYSTERECTOMY    . AV FISTULA PLACEMENT Left 12/25/2014   Procedure: ARTERIOVENOUS (AV) FISTULA CREATION;  Surgeon: Algernon Huxley, MD;  Location:  [facility] ORS;  Service: Vascular;  Laterality: Left;  . BACK SURGERY    . CHOLECYSTECTOMY    . CORONARY ANGIOPLASTY     Cardiac stents  . EYE SURGERY     bilateral cataract extractions  . IR RADIOLOGIST EVAL & MGMT  12/14/2016  . LEFT HEART CATHETERIZATION WITH CORONARY ANGIOGRAM N/A 05/20/2014   Procedure: LEFT HEART CATHETERIZATION WITH CORONARY ANGIOGRAM;  Surgeon: Clent Demark, MD;  Location: [facility]ATH [facility];  Service: Cardiovascular;  Laterality: N/A;  . PERIPHERAL VASCULAR CATHETERIZATION N/A 02/12/2015   Procedure: A/V Shuntogram/Fistulagram;  Surgeon: Algernon Huxley, MD;  Location: [facility];  Service: Cardiovascular;  Laterality: N/A;  . PERIPHERAL VASCULAR CATHETERIZATION N/A 02/12/2015   Procedure: A/V Shunt Intervention;  Surgeon: Algernon Huxley, MD;  Location: [facility];  Service: Cardiovascular;  Laterality: N/A;  . PERIPHERAL VASCULAR CATHETERIZATION N/A 04/09/2015   Procedure: Dialysis/Perma Catheter Removal;  Surgeon: Algernon Huxley, MD;  Location: [facility] CV [facility];  Service: Cardiovascular;  Laterality: N/A;  . PORTACATH PLACEMENT      SOCIAL HISTORY:   Social History  Substance Use Topics  . Smoking status: Never Smoker  . Smokeless tobacco: Never Used  . Alcohol use No    FAMILY HISTORY:   Family History  Problem Relation Age of Onset  . Diabetes Mother   . Hypertension Mother   . Breast cancer Maternal Grandmother   . Breast cancer Paternal Aunt     DRUG ALLERGIES:   Allergies  Allergen Reactions  . Adhesive [Tape] Other (See Comments)    Reaction:  Blisters   . Shellfish-Derived Products     Other reaction(s): Unknown  . Doxycycline Rash    REVIEW OF SYSTEMS:   Review of Systems  Constitutional: Positive for malaise/fatigue. Negative for chills and fever.  HENT: Negative for sore throat.   Eyes: Negative for blurred vision, double vision and pain.  Respiratory: Positive for cough and shortness of breath. Negative for hemoptysis and  wheezing.   Cardiovascular: Positive for chest pain and orthopnea. Negative for palpitations and leg swelling.  Gastrointestinal: Negative for abdominal pain, constipation, diarrhea, heartburn, nausea and vomiting.  Genitourinary: Negative for dysuria and hematuria.  Musculoskeletal: Negative for back pain and joint pain.  Skin: Negative for rash.  Neurological: Positive for weakness. Negative for sensory change, speech change, focal weakness and headaches.  Endo/Heme/Allergies: Does not bruise/bleed easily.  Psychiatric/Behavioral: Negative for depression. The patient is not nervous/anxious.    MEDICATIONS AT HOME:   Prior to Admission medications   Medication Sig Start Date End Date Taking? Authorizing Provider  acetaminophen (TYLENOL) 325 MG tablet Take 650 mg by mouth every 6 (six) hours as needed for mild pain.   Yes [provider]  albuterol (PROVENTIL) (2.5 MG/3ML) 0.083% nebulizer solution Take 2.5 mg by nebulization every 4 (four) hours as needed for wheezing or shortness of breath.   Yes [provider]  amLODipine (NORVASC) 5 MG tablet Take 5 mg by mouth twice daily.   Yes [provider]  arformoterol (BROVANA) 15 MCG/2ML NEBU Take 15 mcg by nebulization 2 (two) times daily.   Yes [provider]  aspirin EC 81 MG tablet Take 81 mg by mouth daily.   Yes [provider]  atorvastatin (LIPITOR) 20 MG tablet Take 20 mg by mouth in the evening.   Yes [provider]  b complex-vitamin c-folic acid (NEPHRO-VITE) 0.8 MG TABS tablet Take 1 tablet by mouth daily.   Yes [provider]  budesonide (PULMICORT) 1 MG/2ML nebulizer solution Take 1 mg by nebulization 2 (two) times daily.   Yes [provider]  calcium acetate (PHOSLO) 667 MG capsule Take 2,001 mg by mouth 3 (three) times daily with meals.   Yes [provider]  Calcium Carbonate-Vitamin D (CALCIUM 600+D) 600-400 MG-UNIT tablet Take 1 tablet by  mouth once daily. 12/06/14  Yes [provider]  clobetasol cream (TEMOVATE) 3.15 % Apply 1 application topically 2 (two) times daily.   Yes [provider]  docusate sodium (COLACE) 100 MG capsule Take 100 mg by mouth daily.   Yes [provider]  fluticasone (FLONASE) 50 MCG/ACT nasal spray Place 2 sprays into both nostrils 2 (two) times daily.   Yes [provider]  furosemide (LASIX) 80 MG tablet Take 80 mg by mouth daily.   Yes [provider]  guaiFENesin (MUCINEX) 600 MG 12 hr tablet Take 1,200 mg by mouth 2 (two) times daily.   Yes [provider]  hydrALAZINE (APRESOLINE) 100 MG tablet Take 100 mg by mouth 3 (three) times daily.    Yes [provider]  HYDROcodone-acetaminophen (NORCO/VICODIN) 5-325 MG tablet Take 1-2 tablets by mouth every 6 (six) hours as needed for moderate pain or severe pain. Give 2 tablets by mouth every 6 hours as needed for severe pain and give 1 tablet by mouth every 6 hours as needed for moderate pain.   Yes [provider]  hydroxypropyl methylcellulose / hypromellose (ISOPTO TEARS /  GONIOVISC) 2.5 % ophthalmic solution Place 2 drops into the right eye every 6 (six) hours as needed for dry eyes (irritation).   Yes [provider]  Insulin Glargine (TOUJEO SOLOSTAR) 300 UNIT/ML SOPN Inject 10 Units into the skin daily.    Yes [provider]  insulin lispro (HUMALOG) 100 UNIT/ML injection Inject 10 units subcutaneously before meals every Sunday, Monday, Wednesday and Friday. Inject 5 units subcutaneously three times a day every Tuesday, Thursday and Saturday.   Yes [provider]  labetalol (NORMODYNE) 300 MG tablet Take 300 mg by mouth 3 (three) times daily.    Yes [provider]  levothyroxine (SYNTHROID, LEVOTHROID) 50 MCG tablet Take 50 mcg by mouth at bedtime.   Yes [provider]  loratadine (CLARITIN) 10 MG tablet Take 10 mg by mouth every  other day.    Yes [provider]  losartan (COZAAR) 100 MG tablet Take 100 mg by mouth daily.    Yes [provider]  magnesium oxide (MAG-OX) 400 MG tablet Take 400 mg by mouth 2 (two) times daily.    Yes [provider]  menthol-cetylpyridinium (CEPACOL) 3 MG lozenge Take 1 lozenge by mouth every 6 (six) hours as needed for sore throat.   Yes [provider]  metoCLOPramide (REGLAN) 5 MG tablet Take 5 mg by mouth at bedtime for nausea and vomitting.   Yes [provider]  mometasone (NASONEX) 50 MCG/ACT nasal spray Place 1 spray into the nose as needed (rhinitis).   Yes [provider]  oxymetazoline (AFRIN) 0.05 % nasal spray Place 1 spray into both nostrils every 12 (twelve) hours as needed (Nose bleeds).   Yes [provider]  pantoprazole (PROTONIX) 40 MG tablet Take 40 mg by mouth once daily.   Yes [provider]  polyethylene glycol (MIRALAX / GLYCOLAX) packet Take 17 g by mouth daily as needed for mild constipation.   Yes [provider]  predniSONE (DELTASONE) 10 MG tablet Take 10 mg by mouth daily with breakfast.   Yes [provider]  pregabalin (LYRICA) 75 MG capsule Take 75 mg by mouth at bedtime.   Yes [provider]  Probiotic Product (ALIGN) 4 MG CAPS Take 4 mg by mouth daily.   Yes [provider]  promethazine (PHENERGAN) 25 MG tablet Take 25 mg by mouth every 6 (six) hours as needed for nausea or vomiting.   Yes [provider]  rOPINIRole (REQUIP) 0.25 MG tablet Take 1 tablet by mouth twice daily. Take 1 tablet by mouth once daily on Sunday, Tuesday and Thursday.   Yes [provider]  sodium chloride (OCEAN) 0.65 % SOLN nasal spray Place 1 spray into both nostrils every 2 (two) hours as needed for congestion.   Yes [provider]  Tiotropium Bromide Monohydrate (SPIRIVA RESPIMAT) 2.5 MCG/ACT AERS Inhale 2 puffs into the lungs every evening.    Yes [provider]  tiZANidine (ZANAFLEX) 4 MG tablet Take 4 mg by mouth at bedtime.   Yes [provider]     VITAL SIGNS:  Blood pressure (!) 189/84, pulse 97, resp. rate 20, height 5\' 1"  (1.549 m), weight 72.6 kg (160 lb), SpO2 95 %.  PHYSICAL EXAMINATION:  Physical Exam  GENERAL:  62 y.o.-year-old patient lying in the bed with no acute distress.  EYES: Pupils equal, round, reactive to light and accommodation. No scleral icterus. Extraocular muscles intact.  HEENT: Head atraumatic, normocephalic. Oropharynx and nasopharynx clear. No oropharyngeal erythema,  moist oral mucosa  NECK:  Supple, no jugular venous distention. No thyroid enlargement, no tenderness.  LUNGS: Increased work of breathing. Decreased air entry at left base crackles up to mid lungs. CARDIOVASCULAR: S1, S2 normal. No murmurs, rubs, or gallops.  ABDOMEN: Soft, nontender, nondistended. Bowel sounds present. No organomegaly or mass.  EXTREMITIES: No pedal edema, cyanosis, or clubbing. + 2 pedal & radial pulses b/l.   NEUROLOGIC: Cranial nerves II through XII are intact. No focal Motor or sensory deficits appreciated b/l. PSYCHIATRIC: The patient is alert and oriented x 3. Good affect.  SKIN: No obvious rash, lesion, or ulcer.   LABORATORY PANEL:   CBC  Recent Labs Lab 04/26/17 0944  WBC 8.5  HGB 8.2*  HCT 24.4*  PLT 158   ------------------------------------------------------------------------------------------------------------------  Chemistries   Recent Labs Lab 04/26/17 0944  NA 139  K 3.3*  CL 98*  CO2 31  GLUCOSE 87  BUN 24*  CREATININE 3.28*  CALCIUM 8.9   ------------------------------------------------------------------------------------------------------------------  Cardiac Enzymes  Recent Labs Lab 04/26/17 0944  TROPONINI 0.05*   ------------------------------------------------------------------------------------------------------------------  RADIOLOGY:   Dg Chest 2 View  Result Date: 04/26/2017 CLINICAL DATA:  Shortness of breath and chest pain since last night. EXAM: CHEST  2 VIEW COMPARISON:  PA and lateral chest 08/01/2016.  CT chest 12/16/2016. FINDINGS: There is extensive bilateral airspace disease and moderate to moderately large pleural effusions, worse on the left. Cardiac silhouette is obscured. No pneumothorax. Aortic atherosclerosis is noted. IMPRESSION: Extensive bilateral airspace disease and left worse than right pleural effusions could be due to congestive failure and/or pneumonia. Electronically Signed   By: Inge Rise M.D.   On: 04/26/2017 10:45   Ct Angio Chest Pe W And/or Wo Contrast  Result Date: 04/26/2017 CLINICAL DATA:  61 year old female with history of intermittent chest pain and shortness of breath since 5 a.m. EXAM: CT ANGIOGRAPHY CHEST WITH CONTRAST TECHNIQUE: Multidetector CT imaging of the chest was performed using the standard protocol during bolus administration of intravenous contrast. Multiplanar CT image reconstructions and MIPs were obtained to evaluate the vascular anatomy. CONTRAST:  75 mL of Isovue 370. COMPARISON:  Chest CT 12/16/2016. FINDINGS: Cardiovascular: Study is limited by considerable patient respiratory motion. With these limitations in mind, there is no evidence of clinically significant central, lobar or segmental sized pulmonary embolism. Small subsegmental sized pulmonary embolus cannot be entirely excluded. Heart size is mildly enlarged. There is no significant pericardial fluid, thickening or pericardial calcification. There is aortic atherosclerosis, as well as atherosclerosis of the great vessels of the mediastinum and the coronary arteries, including calcified atherosclerotic plaque in the left main, left anterior descending, left circumflex and right coronary arteries. Mediastinum/Nodes: No pathologically enlarged mediastinal or hilar lymph nodes. Esophagus is unremarkable in appearance. No  axillary lymphadenopathy. Lungs/Pleura: Small right partially loculated pleural effusion with fluid tracking in the right major fissure. Moderate left pleural effusion appears simple lying dependently. Patchy areas of subsegmental atelectasis in the lungs bilaterally. There is also a background of diffuse ground-glass attenuation and interlobular septal thickening throughout both lungs, compatible with a background of interstitial pulmonary edema. 13 x 9 mm (mean diameter of 11 mm) nodule in the periphery of the left upper lobe (axial image 35 of series 6), new compared to recent prior study from 12/16/2016, presumably infectious or inflammatory, or perhaps a focus of more confluent edema. Upper Abdomen: Aortic atherosclerosis. Musculoskeletal: Old healed right-sided rib fractures are again noted. There are no aggressive appearing lytic or blastic lesions noted in  the visualized portions of the skeleton. Review of the MIP images confirms the above findings. IMPRESSION: 1. Despite the limitations of today's examination there is no evidence to suggest clinically relevant central, lobar or segmental sized pulmonary embolism. Smaller subsegmental sized pulmonary emboli cannot be completely excluded secondary to respiratory motion. 2. The appearance of the chest suggests underlying congestive heart failure, as detailed above. 3. Small right pleural effusion appears partially loculated. Moderate left pleural effusion is simple in appearance lying dependently. 4. Aortic atherosclerosis, in addition to left main and 3 vessel coronary artery disease. Please note that although the presence of coronary artery calcium documents the presence of coronary artery disease, the severity of this disease and any potential stenosis cannot be assessed on this non-gated CT examination. Assessment for potential risk factor modification, dietary therapy or pharmacologic therapy may be warranted, if clinically indicated. 5. New nodular  density in the periphery of the left upper lobe strongly favored to be infectious or inflammatory in etiology (or potentially a focus of more confluent consolidation from a edema) as this is completely new compared to prior study 12/16/2016. Aortic Atherosclerosis (ICD10-I70.0). Electronically Signed   By: Vinnie Langton M.D.   On: 04/26/2017 12:33   IMPRESSION AND PLAN:   * Fluid overload due to acute on chronic diastolic chf or due to ESRD Will need HD. Discussed with Dr. Holley Raring. Will see patient now. TThS. Schedule as outpatient.  * Bilateral pleural effusions with left greater than right. Due to fluid overload. She has had chronic left pleural effusion which is worse today. We will consult her pulmonologist Dr. Humphrey Rolls for further input regarding need for thoracentesis. Will repeat chest x-ray in the morning after dialysis.  * Hypertension. Continue home medications. Blood pressure is elevated. We will add IV medications.  * Diabetes mellitus. Sliding scale insulin ordered.  * Paroxysmal atrial fibrillation. Mildly tachycardic at this time. Due to respiratory distress. Should improve with dialysis.  * DVT prophylaxis with heparin.   All the records are reviewed and case discussed with ED provider. Management plans discussed with the patient, family and they are in agreement.  CODE STATUS: DNR  TOTAL TIME TAKING CARE OF THIS PATIENT: 40 minutes.   Hillary Bow R M.D on 04/26/2017 at 1:29 PM  Between 7am to 6pm - Pager - [phone]  After 6pm go to [email or web address] - password [email or web address] [facility]  Office  [phone]  CC: Primary care physician; Marden Noble, MD  Note: This dictation was prepared with Dragon dictation along with smaller phrase technology. Any transcriptional errors that result from this process are unintentional.

## 2017-04-26 NOTE — Progress Notes (Signed)
Pt was out of room during med round

## 2017-04-26 NOTE — Progress Notes (Signed)
Pre HD. Patient in no distress.

## 2017-04-26 NOTE — ED Notes (Signed)
Admitting MD in room to assess patient at this time.  Will continue to monitor.   

## 2017-04-26 NOTE — ED Provider Notes (Signed)
[facility] Emergency Department Provider Note       Time seen: ----------------------------------------- 9:35 AM on 04/26/2017 -----------------------------------------     I have reviewed the triage vital signs and the nursing notes.   HISTORY   Chief Complaint Shortness of Breath    HPI [name] is a 61 y.o. female with a history of COPD, hypertension, MI and CHF who presents to the ED for shortness of breath and chest pain. patient states she does not ever remember her chest pain being concurrent with shortness of breath. Prior to arrival she had aspirin, nitroglycerin, Solu-Medrol and DuoNeb times. Currently she reports she is feeling better. She had symptoms like this all night last night according to her. Symptoms are mild at this time.  Past Medical History:  Diagnosis Date  . Atrial fibrillation ([place])   . CAD (coronary artery disease)   . CHF (congestive heart failure) ([place])   . CKD (chronic kidney disease)   . COPD (chronic obstructive pulmonary disease) ([place])   . Diabetes ([place])   . Diabetic peripheral neuropathy associated with type 2 diabetes mellitus ([place])   . Dialysis patient ([place])   . GERD (gastroesophageal reflux disease)   . HTN (hypertension)   . MI (myocardial infarction) ([place]) 05-17-2014  . Pancreatitis   . Peripheral autonomic neuropathy due to diabetes mellitus ([place])   . Renal insufficiency    Pt is on dialysis Tuesday, Thursday and Saturday.    Patient Active Problem List   Diagnosis Date Noted  . Closed fracture of surgical neck of humerus 12/02/2016  . Closed fracture of greater tuberosity of humerus 12/02/2016  . Essential hypertension, benign 08/19/2016  . Hyperlipidemia 08/19/2016  . ESRD on dialysis ([place]) 08/19/2016  . Non-pressure chronic ulcer of back, limited to breakdown of skin ([place]) 01/29/2016  . Pneumonia 09/29/2015  . Stage II pressure ulcer of right upper back 09/04/2015  . Stage II pressure ulcer  of back 07/17/2015  . Acute coronary syndrome ([place]) 05/20/2014    Past Surgical History:  Procedure Laterality Date  . ABDOMINAL HYSTERECTOMY    . AV FISTULA PLACEMENT Left 12/25/2014   Procedure: ARTERIOVENOUS (AV) FISTULA CREATION;  Surgeon: Algernon Huxley, MD;  Location: [facility] ORS;  Service: Vascular;  Laterality: Left;  . BACK SURGERY    . CHOLECYSTECTOMY    . CORONARY ANGIOPLASTY     Cardiac stents  . EYE SURGERY     bilateral cataract extractions  . IR RADIOLOGIST EVAL & MGMT  12/14/2016  . LEFT HEART CATHETERIZATION WITH CORONARY ANGIOGRAM N/A 05/20/2014   Procedure: LEFT HEART CATHETERIZATION WITH CORONARY ANGIOGRAM;  Surgeon: Clent Demark, MD;  Location: [place] CATH LAB;  Service: Cardiovascular;  Laterality: N/A;  . PERIPHERAL VASCULAR CATHETERIZATION N/A 02/12/2015   Procedure: A/V Shuntogram/Fistulagram;  Surgeon: Algernon Huxley, MD;  Location: [place] CV LAB;  Service: Cardiovascular;  Laterality: N/A;  . PERIPHERAL VASCULAR CATHETERIZATION N/A 02/12/2015   Procedure: A/V Shunt Intervention;  Surgeon: Algernon Huxley, MD;  Location: [place] CV LAB;  Service: Cardiovascular;  Laterality: N/A;  . PERIPHERAL VASCULAR CATHETERIZATION N/A 04/09/2015   Procedure: Dialysis/Perma Catheter Removal;  Surgeon: Algernon Huxley, MD;  Location: [place] CV LAB;  Service: Cardiovascular;  Laterality: N/A;  . PORTACATH PLACEMENT      Allergies Adhesive [tape]; Shellfish-derived products; and Doxycycline  Social History Social History  Substance Use Topics  . Smoking status: Never Smoker  . Smokeless tobacco: Never Used  . Alcohol use  No    Review of Systems Constitutional: Negative for fever. Cardiovascular: positive for chest pain Respiratory: positive for shortness of breath Gastrointestinal: Negative for abdominal pain, vomiting and diarrhea. Musculoskeletal: Negative for back pain. Skin: Negative for rash. Neurological: Negative for headaches, focal weakness or  numbness.  All systems negative/normal/unremarkable except as stated in the HPI  ____________________________________________   PHYSICAL EXAM:  VITAL SIGNS: ED Triage Vitals [04/26/17 0933]  Enc Vitals Group     BP      Pulse      Resp      Temp      Temp src      SpO2      Weight 160 lb (72.6 kg)     Height 5\' 1"  (1.549 m)     Head Circumference      Peak Flow      Pain Score      Pain Loc      Pain Edu?      Excl. in [place]?     Constitutional: Alert and oriented. chronically ill appearing, no distress Eyes: Conjunctivae are normal. Normal extraocular movements. ENT   Head: Normocephalic and atraumatic.   Nose: No congestion/rhinnorhea.   Mouth/Throat: Mucous membranes are moist.   Neck: No stridor. Cardiovascular: Normal rate, regular rhythm. No murmurs, rubs, or gallops. Respiratory: Normal respiratory effort without tachypnea nor retractions. mild rhonchi bilaterally Gastrointestinal: Soft and nontender. Normal bowel sounds Musculoskeletal: Nontender with normal range of motion in extremities. minimal pitting edema bilaterally Neurologic:  Normal speech and language. No gross focal neurologic deficits are appreciated.  Skin:  Skin is warm, dry and intact. No rash noted. Psychiatric: Mood and affect are normal. Speech and behavior are normal.  ____________________________________________  EKG: Interpreted by me.sinus rhythm rate 89 bpm, normal PR interval, borderline LVH, normal QT  ____________________________________________  ED COURSE:  Pertinent labs & imaging results that were available during my care of the patient were reviewed by me and considered in my medical decision making (see chart for details). Patient presents for chest pain and shortness of breath, we will assess with labs and imaging as indicated.   Procedures ____________________________________________   LABS (pertinent positives/negatives)  Labs Reviewed  CBC WITH  DIFFERENTIAL/PLATELET - Abnormal; Notable for the following:       Result Value   RBC 2.70 (*)    Hemoglobin 8.2 (*)    HCT 24.4 (*)    RDW 16.1 (*)    Eosinophils Absolute 0.8 (*)    All other components within normal limits  BASIC METABOLIC PANEL - Abnormal; Notable for the following:    Potassium 3.3 (*)    Chloride 98 (*)    BUN 24 (*)    Creatinine, Ser 3.28 (*)    GFR calc non Af Amer 14 (*)    GFR calc Af Amer 16 (*)    All other components within normal limits  BRAIN NATRIURETIC PEPTIDE - Abnormal; Notable for the following:    B Natriuretic Peptide >4,500.0 (*)    All other components within normal limits  TROPONIN I - Abnormal; Notable for the following:    Troponin I 0.05 (*)    All other components within normal limits  BLOOD GAS, VENOUS - Abnormal; Notable for the following:    Bicarbonate 36.1 (*)    Acid-Base Excess 10.1 (*)    All other components within normal limits  LIPASE, BLOOD   CRITICAL CARE Performed by: Lenise Arena E   Total critical care time:  30 minutes  Critical care time was exclusive of separately billable procedures and treating other patients.  Critical care was necessary to treat or prevent imminent or life-threatening deterioration.  Critical care was time spent personally by me on the following activities: development of treatment plan with patient and/or surrogate as well as nursing, discussions with consultants, evaluation of patient's response to treatment, examination of patient, obtaining history from patient or surrogate, ordering and performing treatments and interventions, ordering and review of laboratory studies, ordering and review of radiographic studies, pulse oximetry and re-evaluation of patient's condition.  RADIOLOGY  chest x-ray IMPRESSION: Extensive bilateral airspace disease and left worse than right pleural effusions could be due to congestive failure and/or Pneumonia. CT chest IMPRESSION: 1. Despite the  limitations of today's examination there is no evidence to suggest clinically relevant central, lobar or segmental sized pulmonary embolism. Smaller subsegmental sized pulmonary emboli cannot be completely excluded secondary to respiratory motion. 2. The appearance of the chest suggests underlying congestive heart failure, as detailed above. 3. Small right pleural effusion appears partially loculated. Moderate left pleural effusion is simple in appearance lying dependently. 4. Aortic atherosclerosis, in addition to left main and 3 vessel coronary artery disease. Please note that although the presence of coronary artery calcium documents the presence of coronary artery disease, the severity of this disease and any potential stenosis cannot be assessed on this non-gated CT examination. Assessment for potential risk factor modification, dietary therapy or pharmacologic therapy may be warranted, if clinically indicated. 5. New nodular density in the periphery of the left upper lobe strongly favored to be infectious or inflammatory in etiology (or potentially a focus of more confluent consolidation from a edema) as this is completely new compared to prior study 12/16/2016. ____________________________________________  DIFFERENTIAL DIAGNOSIS   MI, pneumonia, COPD, dissection, pancreatitis, CHF exacerbation   FINAL ASSESSMENT AND PLAN  COPD, CHF exacerbation, chest pain  Plan: Patient had presented for chest pain and shortness of breath. Patients labs indicative of chronic kidney disease and likely CHF exacerbation. Patients imaging confirmed what appears to be pulmonary edema with no PE. I did give high dose IV Lasix here with minimal output. She is due for dialysis tomorrow and they would benefit from dialysis today. I will discuss with the hospitalist for admission.   Earleen Newport, MD   Note: This note was generated in part or whole with voice recognition software. Voice  recognition is usually quite accurate but there are transcription errors that can and very often do occur. I apologize for any typographical errors that were not detected and corrected.     Earleen Newport, MD 04/26/17 [phone]

## 2017-04-26 NOTE — ED Notes (Signed)
Date and time results received: 04/26/17 1033 (use smartphrase ".now" to insert current time)  Test: troponin Critical Value: 0.05  Name of Provider Notified: williams

## 2017-04-26 NOTE — Procedures (Signed)
Pt arrived to HDU via hospital transport staff at or about 1745 and was assessed prior to tx and found to have a LUA AVF, which was prepared per p&p and cannulated w 15 ga needles w/o issue by Robert Bellow RN; order includes RTD 3 hours on 4251 bath, BFR 400, DFR 600 and attempt to achieve 1.5 kg fluid removal as tolerated by pt; pt initiated at 1903 w/o issue; pt completed her entire tx and achieved target of 1.5 kg fluid removal w/o complication, all blood was rinsed back and needles aseptically de-cannulated and pressure dressings applied; hemostasis achieved w/o prolonged bleeding and pt was stable at the end of her tx and when leaving HDU under the care of transport staff; report called to primary RN

## 2017-04-26 NOTE — Progress Notes (Signed)
[facility]  ROUNDING NOTE   Subjective:  Patient very well known to [place]. She underwent hemodialysis yesterday. Next line she comes in now with shortness of breath. She appears to have bilateral pleural effusions as well as interstitial edema. She states that her shortness of breath has improved a bit now.   Objective:  Vital signs in last 24 hours:  Pulse Rate:  [90-97] 97 (10/10 1300) Resp:  [20-26] 20 (10/10 1300) BP: (177-189)/(73-84) 189/84 (10/10 1300) SpO2:  [95 %-98 %] 95 % (10/10 1300) Weight:  [72.6 kg (160 lb)] 72.6 kg (160 lb) (10/10 0933)  Weight change:  Filed Weights   04/26/17 0933  Weight: 72.6 kg (160 lb)    Intake/Output: No intake/output data recorded.   Intake/Output this shift:  Total I/O In: -  Out: 50 [Urine:50]  Physical Exam: General: No acute distress  Head: Normocephalic, atraumatic. Moist oral mucosal membranes  Eyes: Anicteric  Neck: Supple, trachea midline  Lungs:  Diminished at bases, normal effort  Heart: S1S2 no rubs  Abdomen:  Soft, nontender, bowel sounds present  Extremities: Trace peripheral edema.  Neurologic: Awake, alert, following commands  Skin: No lesions  Access: LUE AVF    Basic Metabolic Panel:  Recent Labs Lab 04/26/17 0944  NA 139  K 3.3*  CL 98*  CO2 31  GLUCOSE 87  BUN 24*  CREATININE 3.28*  CALCIUM 8.9    Liver Function Tests: No results for input(s): AST, ALT, ALKPHOS, BILITOT, PROT, ALBUMIN in the last 168 hours.  Recent Labs Lab 04/26/17 0944  LIPASE 15   No results for input(s): AMMONIA in the last 168 hours.  CBC:  Recent Labs Lab 04/26/17 0944  WBC 8.5  NEUTROABS 5.6  HGB 8.2*  HCT 24.4*  MCV 90.3  PLT 158    Cardiac Enzymes:  Recent Labs Lab 04/26/17 0944  TROPONINI 0.05*    BNP: Invalid input(s): POCBNP  CBG: No results for input(s): GLUCAP in the last 168 hours.  Microbiology: Results for orders placed or performed in visit on 12/02/16   Microscopic Examination     Status: None   Collection Time: 12/02/16  9:32 AM  Result Value Ref Range Status   WBC, UA 0-5 0 - 5 /hpf Final   RBC, UA 0-2 0 - 2 /hpf Final   Epithelial Cells (non renal) 0-10 0 - 10 /hpf Final   Bacteria, UA Few None seen/Few Final    Coagulation Studies: No results for input(s): LABPROT, INR in the last 72 hours.  Urinalysis:  Recent Labs  04/26/17 1314  COLORURINE YELLOW*  LABSPEC 1.017  PHURINE 7.0  GLUCOSEU NEGATIVE  HGBUR NEGATIVE  BILIRUBINUR NEGATIVE  KETONESUR 5*  PROTEINUR >=300*  NITRITE NEGATIVE  LEUKOCYTESUR NEGATIVE      Imaging: Dg Chest 2 View  Result Date: 04/26/2017 CLINICAL DATA:  Shortness of breath and chest pain since last night. EXAM: CHEST  2 VIEW COMPARISON:  PA and lateral chest 08/01/2016.  CT chest 12/16/2016. FINDINGS: There is extensive bilateral airspace disease and moderate to moderately large pleural effusions, worse on the left. Cardiac silhouette is obscured. No pneumothorax. Aortic atherosclerosis is noted. IMPRESSION: Extensive bilateral airspace disease and left worse than right pleural effusions could be due to congestive failure and/or pneumonia. Electronically Signed   By: Inge Rise M.D.   On: 04/26/2017 10:45   Ct Angio Chest Pe W And/or Wo Contrast  Result Date: 04/26/2017 CLINICAL DATA:  61 year old female with history of intermittent chest  pain and shortness of breath since 5 a.m. EXAM: CT ANGIOGRAPHY CHEST WITH CONTRAST TECHNIQUE: Multidetector CT imaging of the chest was performed using the standard protocol during bolus administration of intravenous contrast. Multiplanar CT image reconstructions and MIPs were obtained to evaluate the vascular anatomy. CONTRAST:  75 mL of Isovue 370. COMPARISON:  Chest CT 12/16/2016. FINDINGS: Cardiovascular: Study is limited by considerable patient respiratory motion. With these limitations in mind, there is no evidence of clinically significant central,  lobar or segmental sized pulmonary embolism. Small subsegmental sized pulmonary embolus cannot be entirely excluded. Heart size is mildly enlarged. There is no significant pericardial fluid, thickening or pericardial calcification. There is aortic atherosclerosis, as well as atherosclerosis of the great vessels of the mediastinum and the coronary arteries, including calcified atherosclerotic plaque in the left main, left anterior descending, left circumflex and right coronary arteries. Mediastinum/Nodes: No pathologically enlarged mediastinal or hilar lymph nodes. Esophagus is unremarkable in appearance. No axillary lymphadenopathy. Lungs/Pleura: Small right partially loculated pleural effusion with fluid tracking in the right major fissure. Moderate left pleural effusion appears simple lying dependently. Patchy areas of subsegmental atelectasis in the lungs bilaterally. There is also a background of diffuse ground-glass attenuation and interlobular septal thickening throughout both lungs, compatible with a background of interstitial pulmonary edema. 13 x 9 mm (mean diameter of 11 mm) nodule in the periphery of the left upper lobe (axial image 35 of series 6), new compared to recent prior study from 12/16/2016, presumably infectious or inflammatory, or perhaps a focus of more confluent edema. Upper Abdomen: Aortic atherosclerosis. Musculoskeletal: Old healed right-sided rib fractures are again noted. There are no aggressive appearing lytic or blastic lesions noted in the visualized portions of the skeleton. Review of the MIP images confirms the above findings. IMPRESSION: 1. Despite the limitations of today's examination there is no evidence to suggest clinically relevant central, lobar or segmental sized pulmonary embolism. Smaller subsegmental sized pulmonary emboli cannot be completely excluded secondary to respiratory motion. 2. The appearance of the chest suggests underlying congestive heart failure, as  detailed above. 3. Small right pleural effusion appears partially loculated. Moderate left pleural effusion is simple in appearance lying dependently. 4. Aortic atherosclerosis, in addition to left main and 3 vessel coronary artery disease. Please note that although the presence of coronary artery calcium documents the presence of coronary artery disease, the severity of this disease and any potential stenosis cannot be assessed on this non-gated CT examination. Assessment for potential risk factor modification, dietary therapy or pharmacologic therapy may be warranted, if clinically indicated. 5. New nodular density in the periphery of the left upper lobe strongly favored to be infectious or inflammatory in etiology (or potentially a focus of more confluent consolidation from a edema) as this is completely new compared to prior study 12/16/2016. Aortic Atherosclerosis (ICD10-I70.0). Electronically Signed   By: Vinnie Langton M.D.   On: 04/26/2017 12:33     Medications:   . sodium chloride     . heparin  5,000 Units Subcutaneous Q8H  . insulin aspart  0-5 Units Subcutaneous QHS  . insulin aspart  0-9 Units Subcutaneous TID WC  . sodium chloride flush  3 mL Intravenous Q12H  . sodium chloride flush  3 mL Intravenous Q12H   sodium chloride, acetaminophen **OR** acetaminophen, albuterol, labetalol, ondansetron **OR** ondansetron (ZOFRAN) IV, polyethylene glycol, sodium chloride flush  Assessment/ Plan:  61 y.o. female with end-stage renal disease, diabetes, with complications of retinopathy, GERD, hyperlipidemia, glaucoma, legal blindness, history of hysterectomy,  right foot surgery, atrial fibrillation, COPD, congestive heart failure, pancreatitis, peripheral neuropathy from diabetes, coronary disease with coronary angioplasty and stents, left renal mass that is enlarging  1. ESRD. [employer]. T-T-S-1. [facility] - patient had hemodialysis yesterday but appears to have pulmonary edema now.  Therefore we will plan for a short dialysis treatment today with ultrafiltration target of 1-1.5 kg.  2. Shortness of breath/pulmonary edema/bilateral pleural effusions. Shortness of breath appears multifactorial at the moment.  We will plan for short dialysis treatment today.  3. Anemia of chronic kidney disease Patient cannot take Epogen given enlarging renal mass.  4. Secondary hyperparathyroidism:  Check phosphorus with dialysis today.   LOS: 0 Mollyann Halbert 10/10/20181:53 PM

## 2017-04-26 NOTE — ED Triage Notes (Signed)
Pt here for intermittent chest pain and SHOB since 5 am.  EMS gave 1 duoneb and 1 albuterol, 1 NTG with 125 mg solumedrol.  From [facility].  Nursing facility gave NTG X 3, 3 duonebs, and 324 mg ASA.

## 2017-04-26 NOTE — Progress Notes (Signed)
Received report from [facility]. Pt coming back from dialysis.

## 2017-04-26 NOTE — ED Notes (Signed)
Attempted to call report at this time.  Richardson Landry, RN to call back.

## 2017-04-26 NOTE — Progress Notes (Signed)
Pre hd 

## 2017-04-26 NOTE — Consult Note (Signed)
Pulmonary Critical Care  Initial Consult Note   [name] VEL:[id number] DOB: 1955/11/07 DOA: 04/26/2017  Referring physician: Darvin Neighbours PCP: Marden Noble, MD   Chief Complaint: shortness of breath  HPI: [name] is a 61 y.o. female   With a history of chronic diastolic heart failure atrial fibrillation chronic kidney disease coronary artery disease presented to the hospital with increasing shortness of breath.  Patient started the day before admission with increasing difficulty breathing and cough and congestion.  Patient was brought to the emergency room evaluated and CT scan was done showing a left-sided greater than right-sided pleural effusion.  There was no evidence of pulmonary embolism noted.  She states that she is feeling better now.  She was seen by Nephrology and was dialyzed.  Her chest x-ray is also showing interstitial edema suggestive of worsening congestive heart failure.  I did review the chest x-ray from today which does show extensive airspace disease which is likely fluid overload  And in addition she has a rather large left pleural effusion.     Review of Systems:  Constitutional:  No weight loss, night sweats, Fevers, chills, fatigue.  HEENT:  No headaches, nasal congestion, post nasal drip,  Cardio-vascular:  No chest pain, +Orthopnea, +PND, +swelling in lower extremities, no anasarca, dizziness, palpitations  GI:  No heartburn, indigestion, abdominal pain, nausea, vomiting, diarrhea  Resp:  +shortness of breath. no productive cough, No coughing up of blood.No wheezing Skin:  no rash or lesions.  Musculoskeletal:  No joint pain or swelling.   Remainder ROS performed and is unremarkable other than noted in HPI  Past Medical History:  Diagnosis Date  . Atrial fibrillation ([place])   . CAD (coronary artery disease)   . CHF (congestive heart failure) ([place])   . CKD (chronic kidney disease)   . COPD (chronic obstructive pulmonary disease) ([place])   .  Diabetes ([place])   . Diabetic peripheral neuropathy associated with type 2 diabetes mellitus ([place])   . Dialysis patient ([place])   . GERD (gastroesophageal reflux disease)   . HTN (hypertension)   . MI (myocardial infarction) ([place]) 05-17-2014  . Pancreatitis   . Peripheral autonomic neuropathy due to diabetes mellitus ([place])   . Renal insufficiency    Pt is on dialysis Tuesday, Thursday and Saturday.   Past Surgical History:  Procedure Laterality Date  . ABDOMINAL HYSTERECTOMY    . AV FISTULA PLACEMENT Left 12/25/2014   Procedure: ARTERIOVENOUS (AV) FISTULA CREATION;  Surgeon: Algernon Huxley, MD;  Location: [facility] ORS;  Service: Vascular;  Laterality: Left;  . BACK SURGERY    . CHOLECYSTECTOMY    . CORONARY ANGIOPLASTY     Cardiac stents  . EYE SURGERY     bilateral cataract extractions  . IR RADIOLOGIST EVAL & MGMT  12/14/2016  . LEFT HEART CATHETERIZATION WITH CORONARY ANGIOGRAM N/A 05/20/2014   Procedure: LEFT HEART CATHETERIZATION WITH CORONARY ANGIOGRAM;  Surgeon: Clent Demark, MD;  Location: [facility] CATH LAB;  Service: Cardiovascular;  Laterality: N/A;  . PERIPHERAL VASCULAR CATHETERIZATION N/A 02/12/2015   Procedure: A/V Shuntogram/Fistulagram;  Surgeon: Algernon Huxley, MD;  Location: [place] CV LAB;  Service: Cardiovascular;  Laterality: N/A;  . PERIPHERAL VASCULAR CATHETERIZATION N/A 02/12/2015   Procedure: A/V Shunt Intervention;  Surgeon: Algernon Huxley, MD;  Location: [facility] CV LAB;  Service: Cardiovascular;  Laterality: N/A;  . PERIPHERAL VASCULAR CATHETERIZATION N/A 04/09/2015   Procedure: Dialysis/Perma Catheter Removal;  Surgeon: Algernon Huxley, MD;  Location:  [place] CV LAB;  Service: Cardiovascular;  Laterality: N/A;  . PORTACATH PLACEMENT     Social History:  reports that she has never smoked. She has never used smokeless tobacco. She reports that she does not drink alcohol or use drugs.  Allergies  Allergen Reactions  . Adhesive [Tape] Other (See Comments)    Reaction:   Blisters   . Shellfish-Derived Products     Other reaction(s): Unknown  . Doxycycline Rash    Family History  Problem Relation Age of Onset  . Diabetes Mother   . Hypertension Mother   . Breast cancer Maternal Grandmother   . Breast cancer Paternal Aunt     Prior to Admission medications   Medication Sig Start Date End Date Taking? Authorizing Provider  acetaminophen (TYLENOL) 325 MG tablet Take 650 mg by mouth every 6 (six) hours as needed for mild pain.   Yes [provider]  albuterol (PROVENTIL) (2.5 MG/3ML) 0.083% nebulizer solution Take 2.5 mg by nebulization every 4 (four) hours as needed for wheezing or shortness of breath.   Yes [provider]  amLODipine (NORVASC) 5 MG tablet Take 5 mg by mouth twice daily.   Yes [provider]  arformoterol (BROVANA) 15 MCG/2ML NEBU Take 15 mcg by nebulization 2 (two) times daily.   Yes [provider]  aspirin EC 81 MG tablet Take 81 mg by mouth daily.   Yes [provider]  atorvastatin (LIPITOR) 20 MG tablet Take 20 mg by mouth in the evening.   Yes [provider]  b complex-vitamin c-folic acid (NEPHRO-VITE) 0.8 MG TABS tablet Take 1 tablet by mouth daily.   Yes [provider]  budesonide (PULMICORT) 1 MG/2ML nebulizer solution Take 1 mg by nebulization 2 (two) times daily.   Yes [provider]  calcium acetate (PHOSLO) 667 MG capsule Take 2,001 mg by mouth 3 (three) times daily with meals.   Yes [provider]  Calcium Carbonate-Vitamin D (CALCIUM 600+D) 600-400 MG-UNIT tablet Take 1 tablet by mouth once daily. 12/06/14  Yes [provider]  clobetasol cream (TEMOVATE) 0.16 % Apply 1 application topically 2 (two) times daily.   Yes [provider]  docusate sodium (COLACE) 100 MG capsule Take 100 mg by mouth daily.   Yes [provider]  fluticasone (FLONASE) 50 MCG/ACT nasal spray Place 2 sprays into both nostrils 2 (two)  times daily.   Yes [provider]  furosemide (LASIX) 80 MG tablet Take 80 mg by mouth daily.   Yes [provider]  guaiFENesin (MUCINEX) 600 MG 12 hr tablet Take 1,200 mg by mouth 2 (two) times daily.   Yes [provider]  hydrALAZINE (APRESOLINE) 100 MG tablet Take 100 mg by mouth 3 (three) times daily.    Yes [provider]  HYDROcodone-acetaminophen (NORCO/VICODIN) 5-325 MG tablet Take 1-2 tablets by mouth every 6 (six) hours as needed for moderate pain or severe pain. Give 2 tablets by mouth every 6 hours as needed for severe pain and give 1 tablet by mouth every 6 hours as needed for moderate pain.   Yes [provider]  hydroxypropyl methylcellulose / hypromellose (ISOPTO TEARS / GONIOVISC) 2.5 % ophthalmic solution Place 2 drops into the right eye every 6 (six) hours as needed for dry eyes (irritation).   Yes [provider]  Insulin Glargine (TOUJEO SOLOSTAR) 300 UNIT/ML SOPN Inject 10 Units into the skin daily.    Yes [provider]  insulin lispro (  HUMALOG) 100 UNIT/ML injection Inject 10 units subcutaneously before meals every Sunday, Monday, Wednesday and Friday. Inject 5 units subcutaneously three times a day every Tuesday, Thursday and Saturday.   Yes [provider]  labetalol (NORMODYNE) 300 MG tablet Take 300 mg by mouth 3 (three) times daily.    Yes [provider]  levothyroxine (SYNTHROID, LEVOTHROID) 50 MCG tablet Take 50 mcg by mouth at bedtime.   Yes [provider]  loratadine (CLARITIN) 10 MG tablet Take 10 mg by mouth every other day.    Yes [provider]  losartan (COZAAR) 100 MG tablet Take 100 mg by mouth daily.    Yes [provider]  magnesium oxide (MAG-OX) 400 MG tablet Take 400 mg by mouth 2 (two) times daily.    Yes [provider]  menthol-cetylpyridinium (CEPACOL) 3 MG lozenge Take 1 lozenge by mouth every 6 (six) hours as needed for sore  throat.   Yes [provider]  metoCLOPramide (REGLAN) 5 MG tablet Take 5 mg by mouth at bedtime for nausea and vomitting.   Yes [provider]  mometasone (NASONEX) 50 MCG/ACT nasal spray Place 1 spray into the nose as needed (rhinitis).   Yes [provider]  oxymetazoline (AFRIN) 0.05 % nasal spray Place 1 spray into both nostrils every 12 (twelve) hours as needed (Nose bleeds).   Yes [provider]  pantoprazole (PROTONIX) 40 MG tablet Take 40 mg by mouth once daily.   Yes [provider]  polyethylene glycol (MIRALAX / GLYCOLAX) packet Take 17 g by mouth daily as needed for mild constipation.   Yes [provider]  predniSONE (DELTASONE) 10 MG tablet Take 10 mg by mouth daily with breakfast.   Yes [provider]  pregabalin (LYRICA) 75 MG capsule Take 75 mg by mouth at bedtime.   Yes [provider]  Probiotic Product (ALIGN) 4 MG CAPS Take 4 mg by mouth daily.   Yes [provider]  promethazine (PHENERGAN) 25 MG tablet Take 25 mg by mouth every 6 (six) hours as needed for nausea or vomiting.   Yes [provider]  rOPINIRole (REQUIP) 0.25 MG tablet Take 1 tablet by mouth twice daily. Take 1 tablet by mouth once daily on Sunday, Tuesday and Thursday.   Yes [provider]  sodium chloride (OCEAN) 0.65 % SOLN nasal spray Place 1 spray into both nostrils every 2 (two) hours as needed for congestion.   Yes [provider]  Tiotropium Bromide Monohydrate (SPIRIVA RESPIMAT) 2.5 MCG/ACT AERS Inhale 2 puffs into the lungs every evening.   Yes [provider]  tiZANidine (ZANAFLEX) 4 MG tablet Take 4 mg by mouth at bedtime.   Yes [provider]   Physical Exam: Vitals:   04/26/17 2045 04/26/17 2100 04/26/17 2105 04/26/17 2115  BP: (!) 175/80 (!) 175/77  (!) 170/74  Pulse: 90 92 93 93  Resp: 20 20 20 20   Temp:    98.2 F (36.8 C)  TempSrc:    Oral  SpO2: 100% 100%  100% 100%  Weight:      Height:        Wt Readings from Last 3 Encounters:  04/26/17 159 lb 13.3 oz (72.5 kg)  02/03/17 165 lb (74.8 kg)  12/14/16 164 lb (74.4 kg)    General:  Appears calm and comfortable Eyes: PERRL, normal lids, irises & conjunctiva ENT: grossly normal hearing, lips & tongue Neck: no LAD, masses or thyromegaly Cardiovascular: RRR, no  m/r/g. +LE edema. Respiratory:   Diminished breath sounds on the left greater than right. Normal respiratory effort. Abdomen: soft, nontender Skin: no rash or induration seen on limited exam Musculoskeletal: grossly normal tone BUE/BLE Psychiatric: grossly normal mood and affect Neurologic: grossly non-focal.          Labs on Admission:  Basic Metabolic Panel:  Recent Labs Lab 04/26/17 0944 04/26/17 1543  NA 139  --   K 3.3*  --   CL 98*  --   CO2 31  --   GLUCOSE 87  --   BUN 24*  --   CREATININE 3.28*  --   CALCIUM 8.9  --   PHOS  --  3.9   Liver Function Tests: No results for input(s): AST, ALT, ALKPHOS, BILITOT, PROT, ALBUMIN in the last 168 hours.  Recent Labs Lab 04/26/17 0944  LIPASE 15   No results for input(s): AMMONIA in the last 168 hours. CBC:  Recent Labs Lab 04/26/17 0944  WBC 8.5  NEUTROABS 5.6  HGB 8.2*  HCT 24.4*  MCV 90.3  PLT 158   Cardiac Enzymes:  Recent Labs Lab 04/26/17 0944  TROPONINI 0.05*    BNP (last 3 results)  Recent Labs  04/26/17 0944  BNP >4,500.0*    ProBNP (last 3 results) No results for input(s): PROBNP in the last 8760 hours.  CBG:  Recent Labs Lab 04/26/17 1416  GLUCAP 149*    Radiological Exams on Admission: Dg Chest 2 View  Result Date: 04/26/2017 CLINICAL DATA:  Shortness of breath and chest pain since last night. EXAM: CHEST  2 VIEW COMPARISON:  PA and lateral chest 08/01/2016.  CT chest 12/16/2016. FINDINGS: There is extensive bilateral airspace disease and moderate to moderately large pleural effusions, worse on the left. Cardiac  silhouette is obscured. No pneumothorax. Aortic atherosclerosis is noted. IMPRESSION: Extensive bilateral airspace disease and left worse than right pleural effusions could be due to congestive failure and/or pneumonia. Electronically Signed   By: Inge Rise M.D.   On: 04/26/2017 10:45   Ct Angio Chest Pe W And/or Wo Contrast  Result Date: 04/26/2017 CLINICAL DATA:  61 year old female with history of intermittent chest pain and shortness of breath since 5 a.m. EXAM: CT ANGIOGRAPHY CHEST WITH CONTRAST TECHNIQUE: Multidetector CT imaging of the chest was performed using the standard protocol during bolus administration of intravenous contrast. Multiplanar CT image reconstructions and MIPs were obtained to evaluate the vascular anatomy. CONTRAST:  75 mL of Isovue 370. COMPARISON:  Chest CT 12/16/2016. FINDINGS: Cardiovascular: Study is limited by considerable patient respiratory motion. With these limitations in mind, there is no evidence of clinically significant central, lobar or segmental sized pulmonary embolism. Small subsegmental sized pulmonary embolus cannot be entirely excluded. Heart size is mildly enlarged. There is no significant pericardial fluid, thickening or pericardial calcification. There is aortic atherosclerosis, as well as atherosclerosis of the great vessels of the mediastinum and the coronary arteries, including calcified atherosclerotic plaque in the left main, left anterior descending, left circumflex and right coronary arteries. Mediastinum/Nodes: No pathologically enlarged mediastinal or hilar lymph nodes. Esophagus is unremarkable in appearance. No axillary lymphadenopathy. Lungs/Pleura: Small right partially loculated pleural effusion with fluid tracking in the right major fissure. Moderate left pleural effusion appears simple lying dependently. Patchy areas of subsegmental atelectasis in the lungs bilaterally. There is also a background of diffuse ground-glass attenuation and  interlobular septal thickening throughout both lungs, compatible with a background of interstitial pulmonary edema. 13 x 9 mm (  mean diameter of 11 mm) nodule in the periphery of the left upper lobe (axial image 35 of series 6), new compared to recent prior study from 12/16/2016, presumably infectious or inflammatory, or perhaps a focus of more confluent edema. Upper Abdomen: Aortic atherosclerosis. Musculoskeletal: Old healed right-sided rib fractures are again noted. There are no aggressive appearing lytic or blastic lesions noted in the visualized portions of the skeleton. Review of the MIP images confirms the above findings. IMPRESSION: 1. Despite the limitations of today's examination there is no evidence to suggest clinically relevant central, lobar or segmental sized pulmonary embolism. Smaller subsegmental sized pulmonary emboli cannot be completely excluded secondary to respiratory motion. 2. The appearance of the chest suggests underlying congestive heart failure, as detailed above. 3. Small right pleural effusion appears partially loculated. Moderate left pleural effusion is simple in appearance lying dependently. 4. Aortic atherosclerosis, in addition to left main and 3 vessel coronary artery disease. Please note that although the presence of coronary artery calcium documents the presence of coronary artery disease, the severity of this disease and any potential stenosis cannot be assessed on this non-gated CT examination. Assessment for potential risk factor modification, dietary therapy or pharmacologic therapy may be warranted, if clinically indicated. 5. New nodular density in the periphery of the left upper lobe strongly favored to be infectious or inflammatory in etiology (or potentially a focus of more confluent consolidation from a edema) as this is completely new compared to prior study 12/16/2016. Aortic Atherosclerosis (ICD10-I70.0). Electronically Signed   By: Vinnie Langton M.D.   On:  04/26/2017 12:33    EKG: Independently reviewed.  Assessment/Plan Active Problems:   CHF (congestive heart failure) (HCC)   1.  acute on chronic Respiratory failure with hypoxia   would the continue with oxygen therapy Underlying factor is likely the pleural effusions as well as fluid overload Continue with dialysis as you are Would also get thoracentesis ordered  2. Bilateral pleural effusions left greater than right  was ordered thoracentesis of the left side to try to drain and improve her respiratory status Would send the fluid off for routine lab work and also cytology  3. End-stage kidney disease on dialysis   followed by Nephrology this will be continued Dialyze as per Nephrology recommendation  4. Pneumonia versus pulmonary edema   I do not think that this is actually pneumonia it is more likely fluid overload With see how her fair P with dialysis goes to see if this helps improve her to improved respiratory status  Code Status:  Full code  Family Communication:  none Disposition Plan:   Time spent:  70 min    I have personally obtained a history, examined the patient, evaluated laboratory and imaging results, formulated the assessment and plan and placed orders.  The Patient requires high complexity decision making for assessment and support.    Allyne Gee, MD [facility] Pulmonary Critical Care Medicine Sleep Medicine

## 2017-04-27 ENCOUNTER — Inpatient Hospital Stay: Payer: Medicare Other

## 2017-04-27 LAB — CBC
HEMATOCRIT: 24.9 % — AB (ref 35.0–47.0)
HEMOGLOBIN: 8.3 g/dL — AB (ref 12.0–16.0)
MCH: 30.5 pg (ref 26.0–34.0)
MCHC: 33.1 g/dL (ref 32.0–36.0)
MCV: 91.9 fL (ref 80.0–100.0)
Platelets: 140 10*3/uL — ABNORMAL LOW (ref 150–440)
RBC: 2.71 MIL/uL — ABNORMAL LOW (ref 3.80–5.20)
RDW: 15.8 % — AB (ref 11.5–14.5)
WBC: 7.4 10*3/uL (ref 3.6–11.0)

## 2017-04-27 LAB — GLUCOSE, CAPILLARY
GLUCOSE-CAPILLARY: 257 mg/dL — AB (ref 65–99)
Glucose-Capillary: 196 mg/dL — ABNORMAL HIGH (ref 65–99)

## 2017-04-27 LAB — BASIC METABOLIC PANEL
ANION GAP: 12 (ref 5–15)
BUN: 22 mg/dL — AB (ref 6–20)
CHLORIDE: 99 mmol/L — AB (ref 101–111)
CO2: 26 mmol/L (ref 22–32)
Calcium: 8.9 mg/dL (ref 8.9–10.3)
Creatinine, Ser: 2.37 mg/dL — ABNORMAL HIGH (ref 0.44–1.00)
GFR calc Af Amer: 24 mL/min — ABNORMAL LOW (ref >60)
GFR calc non Af Amer: 21 mL/min — ABNORMAL LOW (ref >60)
GLUCOSE: 268 mg/dL — AB (ref 65–99)
POTASSIUM: 4.4 mmol/L (ref 3.5–5.1)
Sodium: 137 mmol/L (ref 135–145)

## 2017-04-27 LAB — HEMOGLOBIN A1C
Hgb A1c MFr Bld: 6.1 % — ABNORMAL HIGH (ref 4.8–5.6)
Mean Plasma Glucose: 128.37 mg/dL

## 2017-04-27 LAB — PHOSPHORUS: PHOSPHORUS: 2.3 mg/dL — AB (ref 2.5–4.6)

## 2017-04-27 LAB — MRSA PCR SCREENING: MRSA by PCR: NEGATIVE

## 2017-04-27 MED ORDER — BUDESONIDE 0.5 MG/2ML IN SUSP
1.0000 mg | Freq: Two times a day (BID) | RESPIRATORY_TRACT | Status: DC
  Administered 2017-04-28 – 2017-04-30 (×5): 1 mg via RESPIRATORY_TRACT
  Filled 2017-04-27 (×6): qty 4

## 2017-04-27 MED ORDER — ORAL CARE MOUTH RINSE
15.0000 mL | Freq: Two times a day (BID) | OROMUCOSAL | Status: DC
  Administered 2017-04-28 – 2017-04-29 (×2): 15 mL via OROMUCOSAL

## 2017-04-27 MED ORDER — RISAQUAD PO CAPS
1.0000 | ORAL_CAPSULE | Freq: Every day | ORAL | Status: DC
  Administered 2017-04-28 – 2017-04-30 (×3): 1 via ORAL
  Filled 2017-04-27 (×3): qty 1

## 2017-04-27 NOTE — Care Management (Signed)
[name] with [facility] notified of admission and CSW aware patient is from a skilled facility

## 2017-04-27 NOTE — Progress Notes (Signed)
Pre hd 

## 2017-04-27 NOTE — Progress Notes (Signed)
Patient back from dialysis and about to eat lunch. CBG 179 per [place], [place]. Glucometer not registering.

## 2017-04-27 NOTE — Progress Notes (Signed)
[facility]  ROUNDING NOTE   Subjective:  Patient feeling much better today. She had an extra dialysis treatment and we were able to perform ultrafiltration of 1.5 kg. She is due for dialysis again today.   Objective:  Vital signs in last 24 hours:  Temp:  [97.7 F (36.5 C)-98.3 F (36.8 C)] 98.3 F (36.8 C) (10/11 0427) Pulse Rate:  [85-103] 85 (10/11 0830) Resp:  [18-24] 18 (10/10 2354) BP: (150-189)/(49-84) 167/61 (10/11 0830) SpO2:  [92 %-100 %] 96 % (10/11 0427) Weight:  [67.2 kg (148 lb 1.6 oz)-72.5 kg (159 lb 13.3 oz)] 67.2 kg (148 lb 1.6 oz) (10/11 0500)  Weight change:  Filed Weights   04/26/17 0933 04/26/17 1800 04/27/17 0500  Weight: 72.6 kg (160 lb) 72.5 kg (159 lb 13.3 oz) 67.2 kg (148 lb 1.6 oz)    Intake/Output: I/O last 3 completed shifts: In: 68 [I.V.:3; IV Piggyback:50] Out: 8841 [Urine:150; Other:1500]   Intake/Output this shift:  Total I/O In: 480 [P.O.:480] Out: 25 [Urine:25]  Physical Exam: General: No acute distress  Head: Normocephalic, atraumatic. Moist oral mucosal membranes  Eyes: Anicteric  Neck: Supple, trachea midline  Lungs:  Diminished at bases, normal effort  Heart: S1S2 no rubs  Abdomen:  Soft, nontender, bowel sounds present  Extremities: Trace peripheral edema.  Neurologic: Awake, alert, following commands  Skin: No lesions  Access: LUE AVF    Basic Metabolic Panel:  Recent Labs Lab 04/26/17 0944 04/26/17 1543 04/27/17 0432  NA 139  --  137  K 3.3*  --  4.4  CL 98*  --  99*  CO2 31  --  26  GLUCOSE 87  --  268*  BUN 24*  --  22*  CREATININE 3.28*  --  2.37*  CALCIUM 8.9  --  8.9  PHOS  --  3.9  --     Liver Function Tests: No results for input(s): AST, ALT, ALKPHOS, BILITOT, PROT, ALBUMIN in the last 168 hours.  Recent Labs Lab 04/26/17 0944  LIPASE 15   No results for input(s): AMMONIA in the last 168 hours.  CBC:  Recent Labs Lab 04/26/17 0944 04/27/17 0432  WBC 8.5 7.4  NEUTROABS  5.6  --   HGB 8.2* 8.3*  HCT 24.4* 24.9*  MCV 90.3 91.9  PLT 158 140*    Cardiac Enzymes:  Recent Labs Lab 04/26/17 0944  TROPONINI 0.05*    BNP: Invalid input(s): POCBNP  CBG:  Recent Labs Lab 04/26/17 1416 04/26/17 2151 04/27/17 0800  GLUCAP 149* 146* 16*    Microbiology: Results for orders placed or performed during the hospital encounter of 04/26/17  MRSA PCR Screening     Status: None   Collection Time: 04/27/17  2:05 AM  Result Value Ref Range Status   MRSA by PCR NEGATIVE NEGATIVE Final    Comment:        The GeneXpert MRSA Assay (FDA approved for NASAL specimens only), is one component of a comprehensive MRSA colonization surveillance program. It is not intended to diagnose MRSA infection nor to guide or monitor treatment for MRSA infections.     Coagulation Studies: No results for input(s): LABPROT, INR in the last 72 hours.  Urinalysis:  Recent Labs  04/26/17 1314  COLORURINE YELLOW*  LABSPEC 1.017  PHURINE 7.0  GLUCOSEU NEGATIVE  HGBUR NEGATIVE  BILIRUBINUR NEGATIVE  KETONESUR 5*  PROTEINUR >=300*  NITRITE NEGATIVE  LEUKOCYTESUR NEGATIVE      Imaging: Dg Chest 2 View  Result  Date: 04/26/2017 CLINICAL DATA:  Shortness of breath and chest pain since last night. EXAM: CHEST  2 VIEW COMPARISON:  PA and lateral chest 08/01/2016.  CT chest 12/16/2016. FINDINGS: There is extensive bilateral airspace disease and moderate to moderately large pleural effusions, worse on the left. Cardiac silhouette is obscured. No pneumothorax. Aortic atherosclerosis is noted. IMPRESSION: Extensive bilateral airspace disease and left worse than right pleural effusions could be due to congestive failure and/or pneumonia. Electronically Signed   By: Inge Rise M.D.   On: 04/26/2017 10:45   Ct Angio Chest Pe W And/or Wo Contrast  Result Date: 04/26/2017 CLINICAL DATA:  61 year old female with history of intermittent chest pain and shortness of breath  since 5 a.m. EXAM: CT ANGIOGRAPHY CHEST WITH CONTRAST TECHNIQUE: Multidetector CT imaging of the chest was performed using the standard protocol during bolus administration of intravenous contrast. Multiplanar CT image reconstructions and MIPs were obtained to evaluate the vascular anatomy. CONTRAST:  75 mL of Isovue 370. COMPARISON:  Chest CT 12/16/2016. FINDINGS: Cardiovascular: Study is limited by considerable patient respiratory motion. With these limitations in mind, there is no evidence of clinically significant central, lobar or segmental sized pulmonary embolism. Small subsegmental sized pulmonary embolus cannot be entirely excluded. Heart size is mildly enlarged. There is no significant pericardial fluid, thickening or pericardial calcification. There is aortic atherosclerosis, as well as atherosclerosis of the great vessels of the mediastinum and the coronary arteries, including calcified atherosclerotic plaque in the left main, left anterior descending, left circumflex and right coronary arteries. Mediastinum/Nodes: No pathologically enlarged mediastinal or hilar lymph nodes. Esophagus is unremarkable in appearance. No axillary lymphadenopathy. Lungs/Pleura: Small right partially loculated pleural effusion with fluid tracking in the right major fissure. Moderate left pleural effusion appears simple lying dependently. Patchy areas of subsegmental atelectasis in the lungs bilaterally. There is also a background of diffuse ground-glass attenuation and interlobular septal thickening throughout both lungs, compatible with a background of interstitial pulmonary edema. 13 x 9 mm (mean diameter of 11 mm) nodule in the periphery of the left upper lobe (axial image 35 of series 6), new compared to recent prior study from 12/16/2016, presumably infectious or inflammatory, or perhaps a focus of more confluent edema. Upper Abdomen: Aortic atherosclerosis. Musculoskeletal: Old healed right-sided rib fractures are again  noted. There are no aggressive appearing lytic or blastic lesions noted in the visualized portions of the skeleton. Review of the MIP images confirms the above findings. IMPRESSION: 1. Despite the limitations of today's examination there is no evidence to suggest clinically relevant central, lobar or segmental sized pulmonary embolism. Smaller subsegmental sized pulmonary emboli cannot be completely excluded secondary to respiratory motion. 2. The appearance of the chest suggests underlying congestive heart failure, as detailed above. 3. Small right pleural effusion appears partially loculated. Moderate left pleural effusion is simple in appearance lying dependently. 4. Aortic atherosclerosis, in addition to left main and 3 vessel coronary artery disease. Please note that although the presence of coronary artery calcium documents the presence of coronary artery disease, the severity of this disease and any potential stenosis cannot be assessed on this non-gated CT examination. Assessment for potential risk factor modification, dietary therapy or pharmacologic therapy may be warranted, if clinically indicated. 5. New nodular density in the periphery of the left upper lobe strongly favored to be infectious or inflammatory in etiology (or potentially a focus of more confluent consolidation from a edema) as this is completely new compared to prior study 12/16/2016. Aortic Atherosclerosis (ICD10-I70.0). Electronically  Signed   By: Vinnie Langton M.D.   On: 04/26/2017 12:33     Medications:   . sodium chloride    . sodium chloride    . sodium chloride     . acidophilus  1 capsule Oral Daily  . amLODipine  5 mg Oral Daily  . arformoterol  15 mcg Nebulization BID  . aspirin EC  81 mg Oral Daily  . budesonide  1 mg Nebulization BID  . calcium acetate  2,001 mg Oral TID WC  . docusate sodium  200 mg Oral Daily  . fluticasone  2 spray Each Nare BID  . furosemide  80 mg Oral Daily  . guaiFENesin  1,200 mg  Oral BID  . heparin  5,000 Units Subcutaneous Q8H  . hydrALAZINE  100 mg Oral TID  . insulin aspart  0-5 Units Subcutaneous QHS  . insulin aspart  0-9 Units Subcutaneous TID WC  . insulin glargine  10 Units Subcutaneous Daily  . labetalol  300 mg Oral TID  . levothyroxine  50 mcg Oral QHS  . loratadine  10 mg Oral QODAY  . losartan  100 mg Oral Daily  . magnesium oxide  400 mg Oral BID  . mouth rinse  15 mL Mouth Rinse BID  . metoCLOPramide  5 mg Oral QHS  . pantoprazole  40 mg Oral Daily  . predniSONE  10 mg Oral Q breakfast  . pregabalin  75 mg Oral QHS  . rOPINIRole  0.25 mg Oral BID  . sodium chloride flush  3 mL Intravenous Q12H  . sodium chloride flush  3 mL Intravenous Q12H  . tiotropium  1 capsule Inhalation QPM  . tiZANidine  4 mg Oral QHS   sodium chloride, sodium chloride, sodium chloride, acetaminophen **OR** acetaminophen, albuterol, alteplase, heparin, HYDROcodone-acetaminophen, hydroxypropyl methylcellulose / hypromellose, labetalol, lidocaine (PF), lidocaine-prilocaine, menthol-cetylpyridinium, ondansetron **OR** ondansetron (ZOFRAN) IV, pentafluoroprop-tetrafluoroeth, polyethylene glycol, sodium chloride, sodium chloride flush  Assessment/ Plan:  61 y.o. female with end-stage renal disease, diabetes, with complications of retinopathy, GERD, hyperlipidemia, glaucoma, legal blindness, history of hysterectomy, right foot surgery, atrial fibrillation, COPD, congestive heart failure, pancreatitis, peripheral neuropathy from diabetes, coronary disease with coronary angioplasty and stents, left renal mass that is enlarging  1. ESRD. [employer]. T-T-S-1. [facility] - Patient had next row hemodialysis session yesterday as scheduled. Ultrafiltration achieved was 1.5 kg. We will plan for dialysis again today and plan for another 1.5 kg of ultrafiltration.  2. Shortness of breath/pulmonary edema/bilateral pleural effusions. - Clinically patient has significantly improved.  Appreciate input from pulmonology.  3. Anemia of chronic kidney disease -  We are avoiding Epogen given the fact that the patient has an enlarging renal mass. Patient not a surgical candidate for the renal mass.  4. Secondary hyperparathyroidism:  Phosphorus under good control at 3.9. Continue calcium acetate 3 tablets by mouth 3 times a day with meals.   LOS: 1 Alynna [name] 10/11/201810:19 AM

## 2017-04-27 NOTE — Progress Notes (Signed)
HD tx ended 

## 2017-04-27 NOTE — Progress Notes (Signed)
Hemodialysis initiated without issue via L AVF using 15g needles x2. Patient in no distress. Denies pain. Currently resting with eyes closed

## 2017-04-27 NOTE — Progress Notes (Signed)
CBG 301 per [place]. Glucometer not synching

## 2017-04-27 NOTE — Progress Notes (Signed)
[facility] [place] NAME: [name]    MR#:  [id number]  DATE OF BIRTH:  09/20/1955  SUBJECTIVE:  CHIEF COMPLAINT: Patient's shortness of breath significantly improved. Seen and evaluated her during dialysis. Denies any chest pain  REVIEW OF SYSTEMS:  CONSTITUTIONAL: No fever, fatigue or weakness.  EYES: No blurred or double vision.  EARS, NOSE, AND THROAT: No tinnitus or ear pain.  RESPIRATORY: No cough, shortness of breath, wheezing or hemoptysis.  CARDIOVASCULAR: No chest pain, orthopnea, edema.  GASTROINTESTINAL: No nausea, vomiting, diarrhea or abdominal pain.  GENITOURINARY: No dysuria, hematuria.  ENDOCRINE: No polyuria, nocturia,  HEMATOLOGY: No anemia, easy bruising or bleeding SKIN: No rash or lesion. MUSCULOSKELETAL: No joint pain or arthritis.   NEUROLOGIC: No tingling, numbness, weakness.  PSYCHIATRY: No anxiety or depression.   DRUG ALLERGIES:   Allergies  Allergen Reactions  . Shellfish-Derived Products     Other reaction(s): Unknown  . Doxycycline Rash    VITALS:  Blood pressure (!) 168/65, pulse 95, temperature 98.6 F (37 C), temperature source Oral, resp. rate 16, height 5\' 1"  (1.549 m), weight 60.3 kg (133 lb), SpO2 100 %.  PHYSICAL EXAMINATION:  GENERAL:  61 y.o.-year-old patient lying in the bed with no acute distress.  EYES: Pupils equal, round, reactive to light and accommodation. No scleral icterus. Extraocular muscles intact.  HEENT: Head atraumatic, normocephalic. Oropharynx and nasopharynx clear.  NECK:  Supple, no jugular venous distention. No thyroid enlargement, no tenderness.  LUNGS: Normal breath sounds bilaterally, no wheezing, rales,rhonchi or crepitation. No use of accessory muscles of respiration.  CARDIOVASCULAR: S1, S2 normal. No murmurs, rubs, or gallops.  ABDOMEN: Soft, nontender, nondistended. Bowel sounds present. No organomegaly or mass.  EXTREMITIES: No pedal edema,  cyanosis, or clubbing.  NEUROLOGIC: Cranial nerves II through XII are intact. Muscle strength at her baseline in all extremities. Sensation intact. Gait not checked.  PSYCHIATRIC: The patient is alert and oriented x 3.  SKIN: No obvious rash, lesion, or ulcer.    LABORATORY PANEL:   CBC  Recent Labs Lab 04/27/17 0432  WBC 7.4  HGB 8.3*  HCT 24.9*  PLT 140*   ------------------------------------------------------------------------------------------------------------------  Chemistries   Recent Labs Lab 04/27/17 0432  NA 137  K 4.4  CL 99*  CO2 26  GLUCOSE 268*  BUN 22*  CREATININE 2.37*  CALCIUM 8.9   ------------------------------------------------------------------------------------------------------------------  Cardiac Enzymes  Recent Labs Lab 04/26/17 0944  TROPONINI 0.05*   ------------------------------------------------------------------------------------------------------------------  RADIOLOGY:  Dg Chest 2 View  Result Date: 04/26/2017 CLINICAL DATA:  Shortness of breath and chest pain since last night. EXAM: CHEST  2 VIEW COMPARISON:  PA and lateral chest 08/01/2016.  CT chest 12/16/2016. FINDINGS: There is extensive bilateral airspace disease and moderate to moderately large pleural effusions, worse on the left. Cardiac silhouette is obscured. No pneumothorax. Aortic atherosclerosis is noted. IMPRESSION: Extensive bilateral airspace disease and left worse than right pleural effusions could be due to congestive failure and/or pneumonia. Electronically Signed   By: Inge Rise M.D.   On: 04/26/2017 10:45   Ct Angio Chest Pe W And/or Wo Contrast  Result Date: 04/26/2017 CLINICAL DATA:  61 year old female with history of intermittent chest pain and shortness of breath since 5 a.m. EXAM: CT ANGIOGRAPHY CHEST WITH CONTRAST TECHNIQUE: Multidetector CT imaging of the chest was performed using the standard protocol during bolus administration of  intravenous contrast. Multiplanar CT image reconstructions and MIPs were obtained to evaluate the vascular anatomy.  CONTRAST:  75 mL of Isovue 370. COMPARISON:  Chest CT 12/16/2016. FINDINGS: Cardiovascular: Study is limited by considerable patient respiratory motion. With these limitations in mind, there is no evidence of clinically significant central, lobar or segmental sized pulmonary embolism. Small subsegmental sized pulmonary embolus cannot be entirely excluded. Heart size is mildly enlarged. There is no significant pericardial fluid, thickening or pericardial calcification. There is aortic atherosclerosis, as well as atherosclerosis of the great vessels of the mediastinum and the coronary arteries, including calcified atherosclerotic plaque in the left main, left anterior descending, left circumflex and right coronary arteries. Mediastinum/Nodes: No pathologically enlarged mediastinal or hilar lymph nodes. Esophagus is unremarkable in appearance. No axillary lymphadenopathy. Lungs/Pleura: Small right partially loculated pleural effusion with fluid tracking in the right major fissure. Moderate left pleural effusion appears simple lying dependently. Patchy areas of subsegmental atelectasis in the lungs bilaterally. There is also a background of diffuse ground-glass attenuation and interlobular septal thickening throughout both lungs, compatible with a background of interstitial pulmonary edema. 13 x 9 mm (mean diameter of 11 mm) nodule in the periphery of the left upper lobe (axial image 35 of series 6), new compared to recent prior study from 12/16/2016, presumably infectious or inflammatory, or perhaps a focus of more confluent edema. Upper Abdomen: Aortic atherosclerosis. Musculoskeletal: Old healed right-sided rib fractures are again noted. There are no aggressive appearing lytic or blastic lesions noted in the visualized portions of the skeleton. Review of the MIP images confirms the above findings.  IMPRESSION: 1. Despite the limitations of today's examination there is no evidence to suggest clinically relevant central, lobar or segmental sized pulmonary embolism. Smaller subsegmental sized pulmonary emboli cannot be completely excluded secondary to respiratory motion. 2. The appearance of the chest suggests underlying congestive heart failure, as detailed above. 3. Small right pleural effusion appears partially loculated. Moderate left pleural effusion is simple in appearance lying dependently. 4. Aortic atherosclerosis, in addition to left main and 3 vessel coronary artery disease. Please note that although the presence of coronary artery calcium documents the presence of coronary artery disease, the severity of this disease and any potential stenosis cannot be assessed on this non-gated CT examination. Assessment for potential risk factor modification, dietary therapy or pharmacologic therapy may be warranted, if clinically indicated. 5. New nodular density in the periphery of the left upper lobe strongly favored to be infectious or inflammatory in etiology (or potentially a focus of more confluent consolidation from a edema) as this is completely new compared to prior study 12/16/2016. Aortic Atherosclerosis (ICD10-I70.0). Electronically Signed   By: Vinnie Langton M.D.   On: 04/26/2017 12:33    EKG:   Orders placed or performed during the hospital encounter of 04/26/17  . ED EKG  . ED EKG    ASSESSMENT AND PLAN:   * Fluid overload due to acute on chronic diastolic chf or due to ESRD Patient had dialysis yesterday and today  Follow up with nephrology  [facility]. Schedule as outpatient.  *Chronic renal mass on the left Outpatient follow-up  * Bilateral pleural effusions with left greater than right. Due to fluid overload.  She has had chronic left pleural effusion which is  worse during this admission  consulted her pulmonologist Dr. Humphrey Rolls for further input regarding need for  thoracentesis. Will repeat chest x-ray in the morning after dialysis.  * Hypertension. Continue home medications. Blood pressure is elevated. We will add IV medications.  * Diabetes mellitus. Sliding scale insulin ordered.  * Paroxysmal atrial  fibrillation. Mildly tachycardic at this time. Due to respiratory distress. Should improve with dialysis.  * DVT prophylaxis with heparin.      All the records are reviewed and case discussed with Care Management/Social Workerr. Management plans discussed with the patient, family and they are in agreement.  CODE STATUS: DNR   TOTAL TIME TAKING CARE OF THIS PATIENT: 36  minutes.   POSSIBLE D/C IN 1-2 DAYS, DEPENDING ON CLINICAL CONDITION.  Note: This dictation was prepared with Dragon dictation along with smaller phrase technology. Any transcriptional errors that result from this process are unintentional.   Nicholes Mango M.D on 04/27/2017 at 3:26 PM  Between 7am to 6pm - Pager - [phone] After 6pm go to [email or web address] - password [email or web address] [facility]  Office  [phone]  CC: Primary care physician; Marden Noble, MD

## 2017-04-27 NOTE — Progress Notes (Addendum)
Provided patient with "Living Better with Heart Failure" packet. Briefly reviewed definition of heart failure and signs and symptoms of an exacerbation. Reviewed importance of and reason behind checking weight daily - pt lives in a facility. She states they dont weigh her daily there but they weigh her at HD Discussed importance of keeping track of weights and reporting if she has a significant weight gain, increased swelling, increased SOB, chest discomfort/pain, dizziness, light headedness Reviewed low sodium diet-provided handout of recommended and not recommended foods. Patient lives in facility and is given already prepared meals that she chooses. Will need to make sure facility has her as a low sodium diet. Explained briefly why pt is on the medications (either make you feel better, live longer or keep you out of the hospital) and discussed monitoring and side effects Labetolol, hydralazine, losartan, furosemide Patient has never smoked Patient cannot walk Patient follows a 32oz fluid restriction, states she always stay under  Ramond Dial, Pharm.D, BCPS Clinical Pharmacist

## 2017-04-27 NOTE — Progress Notes (Signed)
Post HD assessment unchanged  

## 2017-04-27 NOTE — Progress Notes (Signed)
Post-HD tx  

## 2017-04-27 NOTE — Progress Notes (Signed)
Pre HD  

## 2017-04-27 NOTE — Progress Notes (Signed)
Patient sacrum red but blanchable. Attempted to place pink foam to prevent further breakdown. Patient refuses to allow staff to place foam on sacrum. Per pt " that will cause something to come up on my bottom". Patient educated on the purpose/ effectiveness of sacral foam and importance of q2 turns. Patient still refuses. Will continue to monitor.   [name] M

## 2017-04-28 ENCOUNTER — Ambulatory Visit: Payer: Medicare Other | Admitting: Surgery

## 2017-04-28 ENCOUNTER — Inpatient Hospital Stay: Payer: Medicare Other

## 2017-04-28 LAB — GLUCOSE, CAPILLARY
GLUCOSE-CAPILLARY: 57 mg/dL — AB (ref 65–99)
GLUCOSE-CAPILLARY: 57 mg/dL — AB (ref 65–99)
GLUCOSE-CAPILLARY: 92 mg/dL (ref 65–99)
GLUCOSE-CAPILLARY: 283 mg/dL — AB (ref 65–99)
Glucose-Capillary: 179 mg/dL — ABNORMAL HIGH (ref 65–99)
Glucose-Capillary: 301 mg/dL — ABNORMAL HIGH (ref 65–99)
Glucose-Capillary: 271 mg/dL — ABNORMAL HIGH (ref 65–99)
Glucose-Capillary: 301 mg/dL — ABNORMAL HIGH (ref 65–99)

## 2017-04-28 LAB — PARATHYROID HORMONE, INTACT (NO CA): PTH: 149 pg/mL — ABNORMAL HIGH (ref 15–65)

## 2017-04-28 LAB — HEPATITIS B SURFACE ANTIGEN: Hepatitis B Surface Ag: NEGATIVE

## 2017-04-28 LAB — HIV ANTIBODY (ROUTINE TESTING W REFLEX): HIV Screen 4th Generation wRfx: NONREACTIVE

## 2017-04-28 MED ORDER — DEXTROSE 5 % IV SOLN
500.0000 mg | Freq: Every day | INTRAVENOUS | Status: AC
  Administered 2017-04-28 – 2017-04-30 (×3): 500 mg via INTRAVENOUS
  Filled 2017-04-28 (×3): qty 500

## 2017-04-28 MED ORDER — DEXTROSE 5 % IV SOLN
1.0000 g | Freq: Every day | INTRAVENOUS | Status: DC
  Administered 2017-04-28 – 2017-04-30 (×3): 1 g via INTRAVENOUS
  Filled 2017-04-28 (×3): qty 10

## 2017-04-28 MED ORDER — INSULIN ASPART 100 UNIT/ML ~~LOC~~ SOLN
3.0000 [IU] | Freq: Three times a day (TID) | SUBCUTANEOUS | Status: DC
  Administered 2017-04-28 – 2017-04-30 (×6): 3 [IU] via SUBCUTANEOUS
  Filled 2017-04-28 (×6): qty 1

## 2017-04-28 MED ORDER — INSULIN GLARGINE 100 UNIT/ML ~~LOC~~ SOLN
7.0000 [IU] | Freq: Every day | SUBCUTANEOUS | Status: DC
  Administered 2017-04-28 – 2017-04-29 (×2): 7 [IU] via SUBCUTANEOUS
  Filled 2017-04-28 (×4): qty 0.07

## 2017-04-28 NOTE — NC FL2 (Signed)
[place] LEVEL OF CARE SCREENING TOOL     IDENTIFICATION  [name] Birthdate: Aug 10, 1955 Sex: female Admission Date (Current Location): 04/26/2017  [place] and [place] Number:  Engineering geologist and Address:  [facility], [address], [place], [place] [address]      Provider Number: [id number]  Attending Physician Name and Address:  Nicholes Mango, MD  Relative Name and Phone Number:  Halichef,Ralph Relative [phone]     Current Level of Care: Hospital Recommended Level of Care: [place] Prior Approval Number:    Date Approved/Denied:   PASRR Number: [id number]  Discharge Plan: SNF    Current Diagnoses: Patient Active Problem List   Diagnosis Date Noted  . CHF (congestive heart failure) ([place]) 04/26/2017  . Closed fracture of surgical neck of humerus 12/02/2016  . Closed fracture of greater tuberosity of humerus 12/02/2016  . Essential hypertension, benign 08/19/2016  . Hyperlipidemia 08/19/2016  . ESRD on dialysis ([place]) 08/19/2016  . Non-pressure chronic ulcer of back, limited to breakdown of skin ([place]) 01/29/2016  . Pneumonia 09/29/2015  . Stage II pressure ulcer of right upper back 09/04/2015  . Stage II pressure ulcer of back 07/17/2015  . Acute coronary syndrome (HCC) 05/20/2014    Orientation RESPIRATION BLADDER Height & Weight     Self, Time, Situation, Place  O2 (3L) Continent Weight: 134 lb (60.8 kg) Height:  5\' 1"  (154.9 cm)  BEHAVIORAL SYMPTOMS/MOOD NEUROLOGICAL BOWEL NUTRITION STATUS      Continent Diet (Renal Diet)  AMBULATORY STATUS COMMUNICATION OF NEEDS Skin   Supervision Verbally Normal                       Personal Care Assistance Level of Assistance  Bathing, Feeding, Dressing Bathing Assistance: Limited assistance Feeding assistance: Independent Dressing Assistance: Limited assistance     Functional Limitations Info  Sight, Hearing, Speech Sight  Info: Impaired (Patient is legally blind) Hearing Info: Adequate Speech Info: Adequate    SPECIAL CARE FACTORS FREQUENCY                       Contractures Contractures Info: Not present    Additional Factors Info  Code Status, Allergies, Insulin Sliding Scale Code Status Info: DNR Allergies Info: SHELLFISH-DERIVED PRODUCTS, DOXYCYCLINE    Insulin Sliding Scale Info: insulin aspart (novoLOG) injection 0-9 Units 3x a day with meals       Current Medications (04/28/2017):  This is the current hospital active medication list Current Facility-Administered Medications  Medication Dose Route Frequency Provider Last Rate Last Dose  . 0.9 %  sodium chloride infusion  250 mL Intravenous PRN Sudini, Alveta Heimlich, MD      . acetaminophen (TYLENOL) tablet 650 mg  650 mg Oral Q6H PRN Sudini, Alveta Heimlich, MD       Or  . acetaminophen (TYLENOL) suppository 650 mg  650 mg Rectal Q6H PRN Sudini, Alveta Heimlich, MD      . acidophilus (RISAQUAD) capsule 1 capsule  1 capsule Oral Daily Hillary Bow, MD   1 capsule at 04/28/17 0921  . albuterol (PROVENTIL) (2.5 MG/3ML) 0.083% nebulizer solution 2.5 mg  2.5 mg Nebulization Q2H PRN Sudini, Alveta Heimlich, MD      . amLODipine (NORVASC) tablet 5 mg  5 mg Oral Daily Hillary Bow, MD   5 mg at 04/28/17 1749  . arformoterol (BROVANA) nebulizer solution 15 mcg  15 mcg Nebulization BID Hillary Bow, MD  15 mcg at 04/28/17 0721  . aspirin EC tablet 81 mg  81 mg Oral Daily Sudini, Alveta Heimlich, MD   81 mg at 04/28/17 1000  . azithromycin (ZITHROMAX) 500 mg in dextrose 5 % 250 mL IVPB  500 mg Intravenous Daily Nicholes Mango, MD   Stopped at 04/28/17 1459  . budesonide (PULMICORT) nebulizer solution 1 mg  1 mg Nebulization BID Hugelmeyer, Alexis, DO   1 mg at 04/28/17 0721  . calcium acetate (PHOSLO) capsule 2,001 mg  2,001 mg Oral TID WC Hillary Bow, MD   2,001 mg at 04/28/17 1726  . cefTRIAXone (ROCEPHIN) 1 g in dextrose 5 % 50 mL IVPB  1 g Intravenous Daily Nicholes Mango, MD    Stopped at 04/28/17 1429  . docusate sodium (COLACE) capsule 200 mg  200 mg Oral Daily Hillary Bow, MD   200 mg at 04/28/17 0921  . fluticasone (FLONASE) 50 MCG/ACT nasal spray 2 spray  2 spray Each Nare BID Hillary Bow, MD   2 spray at 04/28/17 1000  . furosemide (LASIX) tablet 80 mg  80 mg Oral Daily Hillary Bow, MD   80 mg at 04/28/17 0921  . guaiFENesin (MUCINEX) 12 hr tablet 1,200 mg  1,200 mg Oral BID Hillary Bow, MD   1,200 mg at 04/28/17 6948  . heparin injection 5,000 Units  5,000 Units Subcutaneous Q8H Sudini, Alveta Heimlich, MD   5,000 Units at 04/28/17 0732  . hydrALAZINE (APRESOLINE) tablet 100 mg  100 mg Oral TID Hillary Bow, MD   100 mg at 04/28/17 1726  . HYDROcodone-acetaminophen (NORCO/VICODIN) 5-325 MG per tablet 1-2 tablet  1-2 tablet Oral Q6H PRN Sudini, Srikar, MD      . hydroxypropyl methylcellulose / hypromellose (ISOPTO TEARS / GONIOVISC) 2.5 % ophthalmic solution 2 drop  2 drop Right Eye Q6H PRN Sudini, Srikar, MD      . insulin aspart (novoLOG) injection 0-5 Units  0-5 Units Subcutaneous QHS Sudini, Srikar, MD      . insulin aspart (novoLOG) injection 0-9 Units  0-9 Units Subcutaneous TID WC Hillary Bow, MD   3 Units at 04/28/17 1728  . insulin aspart (novoLOG) injection 3 Units  3 Units Subcutaneous TID Aniceto Boss, MD   3 Units at 04/28/17 1727  . insulin glargine (LANTUS) injection 7 Units  7 Units Subcutaneous Daily Gouru, Aruna, MD      . labetalol (NORMODYNE) tablet 300 mg  300 mg Oral TID Hillary Bow, MD   300 mg at 04/28/17 1726  . labetalol (NORMODYNE,TRANDATE) injection 10 mg  10 mg Intravenous Q2H PRN Sudini, Alveta Heimlich, MD      . levothyroxine (SYNTHROID, LEVOTHROID) tablet 50 mcg  50 mcg Oral QHS Hillary Bow, MD   50 mcg at 04/27/17 2116  . loratadine (CLARITIN) tablet 10 mg  10 mg Oral Geralynn Rile, MD   10 mg at 04/27/17 1434  . losartan (COZAAR) tablet 100 mg  100 mg Oral Daily Hillary Bow, MD   100 mg at 04/28/17 5462  .  magnesium oxide (MAG-OX) tablet 400 mg  400 mg Oral BID Hillary Bow, MD   400 mg at 04/28/17 0921  . MEDLINE mouth rinse  15 mL Mouth Rinse BID Hillary Bow, MD   15 mL at 04/28/17 1000  . menthol-cetylpyridinium (CEPACOL) lozenge 3 mg  1 lozenge Oral Q6H PRN Sudini, Alveta Heimlich, MD      . metoCLOPramide (REGLAN) tablet 5 mg  5 mg Oral QHS Hillary Bow, MD   5  mg at 04/27/17 2116  . ondansetron (ZOFRAN) tablet 4 mg  4 mg Oral Q6H PRN Hillary Bow, MD       Or  . ondansetron (ZOFRAN) injection 4 mg  4 mg Intravenous Q6H PRN Sudini, Srikar, MD      . pantoprazole (PROTONIX) EC tablet 40 mg  40 mg Oral Daily Hillary Bow, MD   40 mg at 04/28/17 0921  . polyethylene glycol (MIRALAX / GLYCOLAX) packet 17 g  17 g Oral Daily PRN Sudini, Alveta Heimlich, MD      . predniSONE (DELTASONE) tablet 10 mg  10 mg Oral Q breakfast Hillary Bow, MD   10 mg at 04/28/17 0921  . pregabalin (LYRICA) capsule 75 mg  75 mg Oral QHS Hillary Bow, MD   75 mg at 04/27/17 2116  . rOPINIRole (REQUIP) tablet 0.25 mg  0.25 mg Oral BID Hillary Bow, MD   0.25 mg at 04/28/17 0388  . sodium chloride (OCEAN) 0.65 % nasal spray 1 spray  1 spray Each Nare Q2H PRN Sudini, Srikar, MD      . sodium chloride flush (NS) 0.9 % injection 3 mL  3 mL Intravenous Q12H Hillary Bow, MD   3 mL at 04/28/17 0922  . sodium chloride flush (NS) 0.9 % injection 3 mL  3 mL Intravenous Q12H Hillary Bow, MD   3 mL at 04/28/17 0921  . sodium chloride flush (NS) 0.9 % injection 3 mL  3 mL Intravenous PRN Sudini, Alveta Heimlich, MD      . tiotropium (SPIRIVA) inhalation capsule 18 mcg  1 capsule Inhalation QPM Hillary Bow, MD   18 mcg at 04/28/17 1726  . tiZANidine (ZANAFLEX) tablet 4 mg  4 mg Oral QHS Hillary Bow, MD   4 mg at 04/27/17 2115     Discharge Medications: Please see discharge summary for a list of discharge medications.  Relevant Imaging Results:  Relevant Lab Results:   Additional Information SSN [id number] Patient gets diaysis  [place], and Saturday.  Anterhaus, Jones Broom, LCSWA

## 2017-04-28 NOTE — Clinical Social Work Note (Signed)
Clinical Social Work Assessment  Patient Details  Name: [name] MRN: [id number] Date of Birth: 1955-08-22  Date of referral:  04/28/17               Reason for consult:  Facility Placement                Permission sought to share information with:  Family Supports Permission granted to share information::  Yes, Verbal Permission Granted  Name::     [id number]  Agency::  SNF admissions  Relationship::     Contact Information:     Housing/Transportation Living arrangements for the past 2 months:  [place] of Information:  Patient Patient Interpreter Needed:  None Criminal Activity/Legal Involvement Pertinent to Current Situation/Hospitalization:  No - Comment as needed Significant Relationships:  Other Family Members Lives with:  Facility Resident Do you feel safe going back to the place where you live?  Yes Need for family participation in patient care:  No (Coment)  Care giving concerns:  Patient does not have any concerns about returning back to [facility] SNF.   Social Worker assessment / plan:  Patient is a 61 year old female who is alert and oriented x4, patient states she has been living at [facility] for just over a year.  Patient expressed that her husband recently died in 04/05/2023, they were sharing a room together.  Patient states she requested to move to a different room after he passed away, and she said this helped with her grieving process.  Patient also states she is quite active within the SNF and participates in many activities especially Bingo and she enjoys it.  Patient states she has some friends at the SNF and she would like to return once she has discharged from the hospital.  Patient was informed role of CSW and process for helping her get back to SNF.  Patient did not express any other concerns or issues, she says people treat her well.  Patient did not have any questions.  Employment status:  Retired Designer, industrial/product PT Recommendations:  Not assessed at this time Information / Referral to community resources:  [place]  Patient/Family's Response to care:  Patient is in agreement to returning back to SNF.  Patient/Family's Understanding of and Emotional Response to Diagnosis, Current Treatment, and Prognosis:  Patient expressed she is looking forward to returning back to SNF.  Patient is aware of current treatment plan and prognosis.  Emotional Assessment Appearance:  Appears stated age Attitude/Demeanor/Rapport:    Affect (typically observed):  Appropriate, Calm, Pleasant Orientation:  Oriented to Self, Oriented to Place, Oriented to  Time, Oriented to Situation Alcohol / Substance use:  Not Applicable Psych involvement (Current and /or in the community):  No (Comment)  Discharge Needs  Concerns to be addressed:  Care Coordination Readmission within the last 30 days:  No Current discharge risk:  None Barriers to Discharge:  Continued Medical Work up   [name] 04/28/2017, 6:19 PM

## 2017-04-28 NOTE — Progress Notes (Signed)
[place] [facility] [place] NAME: [name]    MR#:  [id number]  DATE OF BIRTH:  [birth year]  SUBJECTIVE:  CHIEF COMPLAINT: Patient's shortness of breath significantly improved.   REVIEW OF SYSTEMS:  CONSTITUTIONAL: No fever, fatigue or weakness.  EYES: No blurred or double vision.  EARS, NOSE, AND THROAT: No tinnitus or ear pain.  RESPIRATORY: No cough, shortness of breath, wheezing or hemoptysis.  CARDIOVASCULAR: No chest pain, orthopnea, edema.  GASTROINTESTINAL: No nausea, vomiting, diarrhea or abdominal pain.  GENITOURINARY: No dysuria, hematuria.  ENDOCRINE: No polyuria, nocturia,  HEMATOLOGY: No anemia, easy bruising or bleeding SKIN: No rash or lesion. MUSCULOSKELETAL: No joint pain or arthritis.   NEUROLOGIC: No tingling, numbness, weakness.  PSYCHIATRY: No anxiety or depression.   DRUG ALLERGIES:   Allergies  Allergen Reactions  . Shellfish-Derived Products     Other reaction(s): Unknown  . Doxycycline Rash    VITALS:  Blood pressure (!) 153/58, pulse 80, temperature 98.1 F (36.7 C), temperature source Oral, resp. rate 18, height 5\' 1"  (1.549 m), weight 60.8 kg (134 lb), SpO2 98 %.  PHYSICAL EXAMINATION:  GENERAL:  61 y.o.-year-old patient lying in the bed with no acute distress.  EYES: Pupils equal, round, reactive to light and accommodation. No scleral icterus. Extraocular muscles intact.  HEENT: Head atraumatic, normocephalic. Oropharynx and nasopharynx clear.  NECK:  Supple, no jugular venous distention. No thyroid enlargement, no tenderness.  LUNGS: MOD breath sounds bilaterally, no wheezing, rales,rhonchi or crepitation. No use of accessory muscles of respiration.  CARDIOVASCULAR: S1, S2 normal. No murmurs, rubs, or gallops.  ABDOMEN: Soft, nontender, nondistended. Bowel sounds present. No organomegaly or mass.  EXTREMITIES: No pedal edema, cyanosis, or clubbing.  NEUROLOGIC: Cranial nerves II through XII are  intact. Muscle strength at her baseline in all extremities. Sensation intact. Gait not checked.  PSYCHIATRIC: The patient is alert and oriented x 3.  SKIN: No obvious rash, lesion, or ulcer.    LABORATORY PANEL:   CBC  Recent Labs Lab 04/27/17 0432  WBC 7.4  HGB 8.3*  HCT 24.9*  PLT 140*   ------------------------------------------------------------------------------------------------------------------  Chemistries   Recent Labs Lab 04/27/17 0432  NA 137  K 4.4  CL 99*  CO2 26  GLUCOSE 268*  BUN 22*  CREATININE 2.37*  CALCIUM 8.9   ------------------------------------------------------------------------------------------------------------------  Cardiac Enzymes  Recent Labs Lab 04/26/17 0944  TROPONINI 0.05*   ------------------------------------------------------------------------------------------------------------------  RADIOLOGY:  Dg Chest 2 View  Result Date: 04/28/2017 CLINICAL DATA:  Shortness of Breath EXAM: CHEST  2 VIEW COMPARISON:  04/27/2017 FINDINGS: Moderate to large left effusion and small right effusion, similar prior study. Cardiomegaly. Mild interstitial edema. Somewhat more confluent opacity in the right mid and lower lung. Bibasilar atelectasis. IMPRESSION: Moderate to large left effusion and small right effusion, stable. Bibasilar atelectasis. Interstitial prominence could reflect interstitial edema. More confluent right lower lobe infiltrate, asymmetric edema versus pneumonia. Electronically Signed   By: Rolm Baptise M.D.   On: 04/28/2017 11:47   Dg Chest 2 View  Result Date: 04/27/2017 CLINICAL DATA:  History of congestive heart failure . EXAM: CHEST  2 VIEW COMPARISON:  CT 04/26/2017.  Chest x-ray 04/26/2017. FINDINGS: Cardiomegaly with diffuse bilateral from interstitial prominence and bilateral pleural effusions consistent congestive heart failure. Basilar atelectasis. Nodular density in the left lung base best identified by prior CT. No  pneumothorax. IMPRESSION: 1. Congestive heart failure from interstitial edema bilateral pleural effusions. 2. Basilar atelectasis. Electronically Signed  By: [place]   On: 04/27/2017 16:05    EKG:   Orders placed or performed during the hospital encounter of 04/26/17  . ED EKG  . ED EKG    ASSESSMENT AND PLAN:   * Fluid overload due to acute on chronic diastolic chf or due to ESRD Patient had dialysis yesterday Follow up with nephrology  TThS. Schedule as outpatient.  * Bilateral pleural effusions with left greater than right.Secondary to pneumonia and fluid overload She has had chronic left pleural effusion which is  worse during this admission  consulted her pulmonologist Dr. 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 Continue home medications. Blood pressure is elevated. We will add IV medications.  * Diabetes mellitus. Sliding scale insulin ordered.  * Paroxysmal atrial fibrillation. Mildly tachycardic at this time. Due to respiratory distress. Should improve with dialysis.  * DVT prophylaxis with heparin.      All the records are reviewed and case discussed with Care Management/Social Workerr. Management plans discussed with the patient, family and they are in agreement.  CODE STATUS: DNR   TOTAL TIME TAKING CARE OF THIS PATIENT: 36  minutes.   POSSIBLE D/C IN 1-2 DAYS, DEPENDING ON CLINICAL CONDITION.  Note: This dictation was prepared with Dragon dictation along with smaller phrase technology. Any transcriptional errors that result from this process are unintentional.   Nicholes Mango M.D on 04/28/2017 at 5:16 PM  Between 7am to 6pm - Pager - [phone] After 6pm go to [email or web address] - password [email or web address] [place]  Office  [phone]  CC: Primary care physician; Marden Noble, MD

## 2017-04-28 NOTE — Care Management Important Message (Signed)
Important Message  Patient Details  Name: [name] MRN: [id number] Date of Birth: [birth year]   Medicare Important Message Given:  Yes Signed IM notice given   Katrina Stack, RN 04/28/2017, 12:06 PM

## 2017-04-28 NOTE — Progress Notes (Signed)
[facility]  ROUNDING NOTE   Subjective:  Pt had HD yesterday. Shortness of breath much improved with extra dialysis. Overall doing better.    Objective:  Vital signs in last 24 hours:  Temp:  [97.5 F (36.4 C)-98.4 F (36.9 C)] 98.1 F (36.7 C) (10/12 1225) Pulse Rate:  [71-85] 80 (10/12 1225) Resp:  [17-18] 18 (10/12 1225) BP: (137-157)/(54-78) 153/58 (10/12 1225) SpO2:  [96 %-100 %] 98 % (10/12 1225) Weight:  [60.8 kg (134 lb)] 60.8 kg (134 lb) (10/12 0329)  Weight change: -3.629 kg (-8 lb) Filed Weights   04/27/17 1332 04/27/17 1433 04/28/17 0329  Weight: 67.8 kg (149 lb 7.6 oz) 60.3 kg (133 lb) 60.8 kg (134 lb)    Intake/Output: I/O last 3 completed shifts: In: 723 [P.O.:720; I.V.:3] Out: 3275 [Urine:275; Other:3000]   Intake/Output this shift:  Total I/O In: 480 [P.O.:480] Out: -   Physical Exam: General: No acute distress  Head: Normocephalic, atraumatic. Moist oral mucosal membranes  Eyes: Anicteric  Neck: Supple, trachea midline  Lungs:  CTAB, normal effort  Heart: S1S2 no rubs  Abdomen:  Soft, nontender, bowel sounds present  Extremities: Trace peripheral edema.  Neurologic: Awake, alert, following commands  Skin: No lesions  Access: LUE AVF    Basic Metabolic Panel:  Recent Labs Lab 04/26/17 0944 04/26/17 1543 04/27/17 0432 04/27/17 1039  NA 139  --  137  --   K 3.3*  --  4.4  --   CL 98*  --  99*  --   CO2 31  --  26  --   GLUCOSE 87  --  268*  --   BUN 24*  --  22*  --   CREATININE 3.28*  --  2.37*  --   CALCIUM 8.9  --  8.9  --   PHOS  --  3.9  --  2.3*    Liver Function Tests: No results for input(s): AST, ALT, ALKPHOS, BILITOT, PROT, ALBUMIN in the last 168 hours.  Recent Labs Lab 04/26/17 0944  LIPASE 15   No results for input(s): AMMONIA in the last 168 hours.  CBC:  Recent Labs Lab 04/26/17 0944 04/27/17 0432  WBC 8.5 7.4  NEUTROABS 5.6  --   HGB 8.2* 8.3*  HCT 24.4* 24.9*  MCV 90.3 91.9  PLT  158 140*    Cardiac Enzymes:  Recent Labs Lab 04/26/17 0944  TROPONINI 0.05*    BNP: Invalid input(s): POCBNP  CBG:  Recent Labs Lab 04/27/17 2053 04/28/17 0740 04/28/17 0748 04/28/17 0819 04/28/17 1224  GLUCAP 196* 35* 12* 78 271*    Microbiology: Results for orders placed or performed during the hospital encounter of 04/26/17  MRSA PCR Screening     Status: None   Collection Time: 04/27/17  2:05 AM  Result Value Ref Range Status   MRSA by PCR NEGATIVE NEGATIVE Final    Comment:        The GeneXpert MRSA Assay (FDA approved for NASAL specimens only), is one component of a comprehensive MRSA colonization surveillance program. It is not intended to diagnose MRSA infection nor to guide or monitor treatment for MRSA infections.     Coagulation Studies: No results for input(s): LABPROT, INR in the last 72 hours.  Urinalysis:  Recent Labs  04/26/17 1314  COLORURINE YELLOW*  LABSPEC 1.017  PHURINE 7.0  GLUCOSEU NEGATIVE  HGBUR NEGATIVE  BILIRUBINUR NEGATIVE  KETONESUR 5*  PROTEINUR >=300*  NITRITE NEGATIVE  LEUKOCYTESUR NEGATIVE  Imaging: Dg Chest 2 View  Result Date: 04/28/2017 CLINICAL DATA:  Shortness of Breath EXAM: CHEST  2 VIEW COMPARISON:  04/27/2017 FINDINGS: Moderate to large left effusion and small right effusion, similar prior study. Cardiomegaly. Mild interstitial edema. Somewhat more confluent opacity in the right mid and lower lung. Bibasilar atelectasis. IMPRESSION: Moderate to large left effusion and small right effusion, stable. Bibasilar atelectasis. Interstitial prominence could reflect interstitial edema. More confluent right lower lobe infiltrate, asymmetric edema versus pneumonia. Electronically Signed   By: Rolm Baptise M.D.   On: 04/28/2017 11:47   Dg Chest 2 View  Result Date: 04/27/2017 CLINICAL DATA:  History of congestive heart failure . EXAM: CHEST  2 VIEW COMPARISON:  CT 04/26/2017.  Chest x-ray 04/26/2017.  FINDINGS: Cardiomegaly with diffuse bilateral from interstitial prominence and bilateral pleural effusions consistent congestive heart failure. Basilar atelectasis. Nodular density in the left lung base best identified by prior CT. No pneumothorax. IMPRESSION: 1. Congestive heart failure from interstitial edema bilateral pleural effusions. 2. Basilar atelectasis. Electronically Signed   By: Marcello Moores  Register   On: 04/27/2017 16:05     Medications:   . sodium chloride    . azithromycin Stopped (04/28/17 1459)  . cefTRIAXone (ROCEPHIN)  IV Stopped (04/28/17 1429)   . acidophilus  1 capsule Oral Daily  . amLODipine  5 mg Oral Daily  . arformoterol  15 mcg Nebulization BID  . aspirin EC  81 mg Oral Daily  . budesonide (PULMICORT) nebulizer solution  1 mg Nebulization BID  . calcium acetate  2,001 mg Oral TID WC  . docusate sodium  200 mg Oral Daily  . fluticasone  2 spray Each Nare BID  . furosemide  80 mg Oral Daily  . guaiFENesin  1,200 mg Oral BID  . heparin  5,000 Units Subcutaneous Q8H  . hydrALAZINE  100 mg Oral TID  . insulin aspart  0-5 Units Subcutaneous QHS  . insulin aspart  0-9 Units Subcutaneous TID WC  . insulin aspart  3 Units Subcutaneous TID AC  . insulin glargine  7 Units Subcutaneous Daily  . labetalol  300 mg Oral TID  . levothyroxine  50 mcg Oral QHS  . loratadine  10 mg Oral QODAY  . losartan  100 mg Oral Daily  . magnesium oxide  400 mg Oral BID  . mouth rinse  15 mL Mouth Rinse BID  . metoCLOPramide  5 mg Oral QHS  . pantoprazole  40 mg Oral Daily  . predniSONE  10 mg Oral Q breakfast  . pregabalin  75 mg Oral QHS  . rOPINIRole  0.25 mg Oral BID  . sodium chloride flush  3 mL Intravenous Q12H  . sodium chloride flush  3 mL Intravenous Q12H  . tiotropium  1 capsule Inhalation QPM  . tiZANidine  4 mg Oral QHS   sodium chloride, acetaminophen **OR** acetaminophen, albuterol, HYDROcodone-acetaminophen, hydroxypropyl methylcellulose / hypromellose, labetalol,  menthol-cetylpyridinium, ondansetron **OR** ondansetron (ZOFRAN) IV, polyethylene glycol, sodium chloride, sodium chloride flush  Assessment/ Plan:  61 y.o. female with end-stage renal disease, diabetes, with complications of retinopathy, GERD, hyperlipidemia, glaucoma, legal blindness, history of hysterectomy, right foot surgery, atrial fibrillation, COPD, congestive heart failure, pancreatitis, peripheral neuropathy from diabetes, coronary disease with coronary angioplasty and stents, left renal mass that is enlarging  1. ESRD. [employer]. T-T-S-1. [facility] - Pt has done much better with extra dialysis, no acute indication for HD today, will plan for HD again tomorrow if still here.   2.  Shortness of breath/pulmonary edema/bilateral pleural effusions. - Has had extra dialysis this week, improved shortness of breath.   3. Anemia of chronic kidney disease -  hgb currently 8.3, can't give epogen given renal mass.   4. Secondary hyperparathyroidism:  Continue calcium acetate, monitor phos with HD.   5.  Renal mass: being followed by urology, avoiding epogen given enlarging renal mass.    LOS: 2 Raygen Linquist 10/12/20184:59 PM

## 2017-04-28 NOTE — Progress Notes (Signed)
Inpatient Diabetes Program Recommendations  AACE/ADA: New Consensus Statement on Inpatient Glycemic Control (2015)  Target Ranges:  Prepandial:   less than 140 mg/dL      Peak postprandial:   less than 180 mg/dL (1-2 hours)      Critically ill patients:  140 - 180 mg/dL   Lab Results  Component Value Date   GLUCAP 92 04/28/2017   HGBA1C 6.1 (H) 04/26/2017    Review of Glycemic Control  Results for INAARA, TYE (MRN [id number]) as of 04/28/2017 10:53  Ref. Range 04/27/2017 17:09 04/27/2017 20:53 04/28/2017 07:40 04/28/2017 07:48 04/28/2017 08:19  Glucose-Capillary Latest Ref Range: 65 - 99 mg/dL 301 (H) 196 (H) 57 (L) 57 (L) 92    Diabetes history: Type 2 Outpatient Diabetes medications: 5-10 units Humalog tid with meals (depends on day of the week), Toujeo 10 units qday  Current orders for Inpatient glycemic control: Lantus 10 units, Novolog 0-9 units tid, Novolog 0-5 units qhs,  * steroids qam   Inpatient Diabetes Program Recommendations:  Consider decreasing Lantus to 7 units qhs, add Novolog 3 units tid with meals, continue Novolog correction tid and hs  Gentry Fitz, RN, [place], [place], CDE Diabetes Coordinator Inpatient Diabetes Program  [phone] (Team Pager) [phone] ([place]) 04/28/2017 10:57 AM

## 2017-04-29 LAB — CBC
HCT: 23.5 % — ABNORMAL LOW (ref 35.0–47.0)
Hemoglobin: 7.6 g/dL — ABNORMAL LOW (ref 12.0–16.0)
MCH: 29.8 pg (ref 26.0–34.0)
MCHC: 32.5 g/dL (ref 32.0–36.0)
MCV: 91.6 fL (ref 80.0–100.0)
Platelets: 145 10*3/uL — ABNORMAL LOW (ref 150–440)
RBC: 2.57 MIL/uL — AB (ref 3.80–5.20)
RDW: 16 % — ABNORMAL HIGH (ref 11.5–14.5)
WBC: 6.7 10*3/uL (ref 3.6–11.0)

## 2017-04-29 LAB — GLUCOSE, CAPILLARY
GLUCOSE-CAPILLARY: 163 mg/dL — AB (ref 65–99)
GLUCOSE-CAPILLARY: 263 mg/dL — AB (ref 65–99)
GLUCOSE-CAPILLARY: 237 mg/dL — AB (ref 65–99)
Glucose-Capillary: 201 mg/dL — ABNORMAL HIGH (ref 65–99)

## 2017-04-29 LAB — RENAL FUNCTION PANEL
Albumin: 3.2 g/dL — ABNORMAL LOW (ref 3.5–5.0)
Anion gap: 11 (ref 5–15)
BUN: 51 mg/dL — ABNORMAL HIGH (ref 6–20)
CHLORIDE: 95 mmol/L — AB (ref 101–111)
CO2: 28 mmol/L (ref 22–32)
CREATININE: 4.02 mg/dL — AB (ref 0.44–1.00)
Calcium: 9.6 mg/dL (ref 8.9–10.3)
GFR calc Af Amer: 13 mL/min — ABNORMAL LOW (ref >60)
GFR, EST NON AFRICAN AMERICAN: 11 mL/min — AB (ref >60)
Glucose, Bld: 270 mg/dL — ABNORMAL HIGH (ref 65–99)
POTASSIUM: 4.2 mmol/L (ref 3.5–5.1)
Phosphorus: 1.7 mg/dL — ABNORMAL LOW (ref 2.5–4.6)
Sodium: 134 mmol/L — ABNORMAL LOW (ref 135–145)

## 2017-04-29 NOTE — Plan of Care (Signed)
Problem: Physical Regulation: Goal: Ability to maintain clinical measurements within normal limits will improve Outcome: Not Progressing Current hemoglobin = only 8.3. Will continue to monitor. Wenda Low [facility]

## 2017-04-29 NOTE — Progress Notes (Signed)
[place] [facility] [place] NAME: [name]    MR#:  [id number]  DATE OF BIRTH:  1955/12/27  SUBJECTIVE:   patient doing better this am Less SOB  REVIEW OF SYSTEMS:    Review of Systems  Constitutional: Negative for fever, chills weight loss HENT: Negative for ear pain, nosebleeds, congestion, facial swelling, rhinorrhea, neck pain, neck stiffness and ear discharge.   Respiratory: Negative for cough, ++improving shortness of breath, no wheezing  Cardiovascular: Negative for chest pain, palpitations and leg swelling.  Gastrointestinal: Negative for heartburn, abdominal pain, vomiting, diarrhea or consitpation Genitourinary: Negative for dysuria, urgency, frequency, hematuria Musculoskeletal: Negative for back pain or joint pain Neurological: Negative for dizziness, seizures, syncope, focal weakness,  numbness and headaches.  Hematological: Does not bruise/bleed easily.  Psychiatric/Behavioral: Negative for hallucinations, confusion, dysphoric mood    Tolerating Diet: yes      DRUG ALLERGIES:   Allergies  Allergen Reactions  . Shellfish-Derived Products     Other reaction(s): Unknown  . Doxycycline Rash    VITALS:  Blood pressure (!) 130/52, pulse 78, temperature 97.6 F (36.4 C), temperature source Oral, resp. rate 18, height 5\' 1"  (1.549 m), weight 63.2 kg (139 lb 6.4 oz), SpO2 100 %.  PHYSICAL EXAMINATION:  Constitutional: Appears well-developed and well-nourished. No distress. HENT: Normocephalic. Marland Kitchen Oropharynx is clear and moist.  Eyes: Conjunctivae and EOM are normal. PERRLA, no scleral icterus.  Neck: Normal ROM. Neck supple. No JVD. No tracheal deviation. CVS: RRR, S1/S2 +, no murmurs, no gallops, no carotid bruit.  Pulmonary: decreased left base , no stridor, rhonchi, wheezes, rales.  Abdominal: Soft. BS +,  no distension, tenderness, rebound or guarding.  Musculoskeletal: Normal range of motion. No edema and no  tenderness.  Neuro: Alert. CN 2-12 grossly intact. No focal deficits. Skin: Skin is warm and dry. No rash noted. Psychiatric: Normal mood and affect.      LABORATORY PANEL:   CBC  Recent Labs Lab 04/27/17 0432  WBC 7.4  HGB 8.3*  HCT 24.9*  PLT 140*   ------------------------------------------------------------------------------------------------------------------  Chemistries   Recent Labs Lab 04/27/17 0432  NA 137  K 4.4  CL 99*  CO2 26  GLUCOSE 268*  BUN 22*  CREATININE 2.37*  CALCIUM 8.9   ------------------------------------------------------------------------------------------------------------------  Cardiac Enzymes  Recent Labs Lab 04/26/17 0944  TROPONINI 0.05*   ------------------------------------------------------------------------------------------------------------------  RADIOLOGY:  Dg Chest 2 View  Result Date: 04/28/2017 CLINICAL DATA:  Shortness of Breath EXAM: CHEST  2 VIEW COMPARISON:  04/27/2017 FINDINGS: Moderate to large left effusion and small right effusion, similar prior study. Cardiomegaly. Mild interstitial edema. Somewhat more confluent opacity in the right mid and lower lung. Bibasilar atelectasis. IMPRESSION: Moderate to large left effusion and small right effusion, stable. Bibasilar atelectasis. Interstitial prominence could reflect interstitial edema. More confluent right lower lobe infiltrate, asymmetric edema versus pneumonia. Electronically Signed   By: Rolm Baptise M.D.   On: 04/28/2017 11:47   Dg Chest 2 View  Result Date: 04/27/2017 CLINICAL DATA:  History of congestive heart failure . EXAM: CHEST  2 VIEW COMPARISON:  CT 04/26/2017.  Chest x-ray 04/26/2017. FINDINGS: Cardiomegaly with diffuse bilateral from interstitial prominence and bilateral pleural effusions consistent congestive heart failure. Basilar atelectasis. Nodular density in the left lung base best identified by prior CT. No pneumothorax. IMPRESSION: 1.  Congestive heart failure from interstitial edema bilateral pleural effusions. 2. Basilar atelectasis. Electronically Signed   By: Marcello Moores  Register   On: 04/27/2017 16:05  ASSESSMENT AND PLAN:   61 year old female with end-stage renal disease on hemodialysis who presented with shortness of breath.  1. Acute on chronic hypoxic respiratory failure in the setting of fluid overload due to end-stage renal disease Patient is now on baseline oxygen  2. ESRD on hemodialysis: Plan for dialysis today  3. Bilateral pleural effusions left greater than right colon Pulmonary consultation pending Consider thoracentesis  4. CAPD: Continue Rocephin and azithromycin  5. Chronic left renal mass: Patient will continue outpatient follow-up  6. Essential hypertension: Continue Norvasc, labetalol, losartan  7. Hypothyroidism: Continue Synthroid  8. Diabetes: Continue sliding scale, NovoLog and Lantus.   Management plans discussed with the patient and she is in agreement.  CODE STATUS: DNR  TOTAL TIME TAKING CARE OF THIS PATIENT: 27 minutes.   She will return back to skilled nursing facility once medically stable for discharge.  POSSIBLE D/C 1-2 days, DEPENDING ON CLINICAL CONDITION.   [name] M.D on 04/29/2017 at 12:51 PM  Between 7am to 6pm - Pager - [phone] After 6pm go to [email or web address] - password [email or web address] [facility]  Office  [phone]  CC: Primary care physician; Marden Noble, MD  Note: This dictation was prepared with Dragon dictation along with smaller phrase technology. Any transcriptional errors that result from this process are unintentional.

## 2017-04-29 NOTE — Progress Notes (Signed)
Pre hd vitals 

## 2017-04-29 NOTE — Progress Notes (Signed)
This note also relates to the following rows which could not be included: Resp - Cannot attach notes to unvalidated device data BP - Cannot attach notes to unvalidated device data  HD COMPLETED

## 2017-04-29 NOTE — Progress Notes (Signed)
[facility]  ROUNDING NOTE   Subjective:  Patient due for hemodialysis today. Overall shortness of breath has improved as compared to admission. Patient has received extra dialysis this week.  Objective:  Vital signs in last 24 hours:  Temp:  [97.5 F (36.4 C)-97.7 F (36.5 C)] 97.6 F (36.4 C) (10/13 1224) Pulse Rate:  [76-86] 78 (10/13 1224) Resp:  [18-19] 18 (10/13 1224) BP: (130-147)/(46-58) 130/52 (10/13 1224) SpO2:  [96 %-100 %] 100 % (10/13 1224) Weight:  [63.2 kg (139 lb 6.4 oz)] 63.2 kg (139 lb 6.4 oz) (10/13 0435)  Weight change: -5.715 kg (-12 lb 9.6 oz) Filed Weights   04/27/17 1433 04/28/17 0329 04/29/17 0435  Weight: 60.3 kg (133 lb) 60.8 kg (134 lb) 63.2 kg (139 lb 6.4 oz)    Intake/Output: I/O last 3 completed shifts: In: 38 [P.O.:960] Out: 200 [Urine:200]   Intake/Output this shift:  Total I/O In: 240 [P.O.:240] Out: 200 [Urine:200]  Physical Exam: General: No acute distress  Head: Normocephalic, atraumatic. Moist oral mucosal membranes  Eyes: Anicteric  Neck: Supple, trachea midline  Lungs:  CTAB, normal effort  Heart: S1S2 no rubs  Abdomen:  Soft, nontender, bowel sounds present  Extremities: Trace peripheral edema.  Neurologic: Awake, alert, following commands  Skin: No lesions  Access: LUE AVF    Basic Metabolic Panel:  Recent Labs Lab 04/26/17 0944 04/26/17 1543 04/27/17 0432 04/27/17 1039  NA 139  --  137  --   K 3.3*  --  4.4  --   CL 98*  --  99*  --   CO2 31  --  26  --   GLUCOSE 87  --  268*  --   BUN 24*  --  22*  --   CREATININE 3.28*  --  2.37*  --   CALCIUM 8.9  --  8.9  --   PHOS  --  3.9  --  2.3*    Liver Function Tests: No results for input(s): AST, ALT, ALKPHOS, BILITOT, PROT, ALBUMIN in the last 168 hours.  Recent Labs Lab 04/26/17 0944  LIPASE 15   No results for input(s): AMMONIA in the last 168 hours.  CBC:  Recent Labs Lab 04/26/17 0944 04/27/17 0432  WBC 8.5 7.4  NEUTROABS  5.6  --   HGB 8.2* 8.3*  HCT 24.4* 24.9*  MCV 90.3 91.9  PLT 158 140*    Cardiac Enzymes:  Recent Labs Lab 04/26/17 0944  TROPONINI 0.05*    BNP: Invalid input(s): POCBNP  CBG:  Recent Labs Lab 04/28/17 0819 04/28/17 1224 04/28/17 1657 04/28/17 2004 04/29/17 0818  GLUCAP 92 271* 283* 301* 201*    Microbiology: Results for orders placed or performed during the hospital encounter of 04/26/17  MRSA PCR Screening     Status: None   Collection Time: 04/27/17  2:05 AM  Result Value Ref Range Status   MRSA by PCR NEGATIVE NEGATIVE Final    Comment:        The GeneXpert MRSA Assay (FDA approved for NASAL specimens only), is one component of a comprehensive MRSA colonization surveillance program. It is not intended to diagnose MRSA infection nor to guide or monitor treatment for MRSA infections.     Coagulation Studies: No results for input(s): LABPROT, INR in the last 72 hours.  Urinalysis: No results for input(s): COLORURINE, LABSPEC, PHURINE, GLUCOSEU, HGBUR, BILIRUBINUR, KETONESUR, PROTEINUR, UROBILINOGEN, NITRITE, LEUKOCYTESUR in the last 72 hours.  Invalid input(s): APPERANCEUR    Imaging: Dg  Chest 2 View  Result Date: 04/28/2017 CLINICAL DATA:  Shortness of Breath EXAM: CHEST  2 VIEW COMPARISON:  04/27/2017 FINDINGS: Moderate to large left effusion and small right effusion, similar prior study. Cardiomegaly. Mild interstitial edema. Somewhat more confluent opacity in the right mid and lower lung. Bibasilar atelectasis. IMPRESSION: Moderate to large left effusion and small right effusion, stable. Bibasilar atelectasis. Interstitial prominence could reflect interstitial edema. More confluent right lower lobe infiltrate, asymmetric edema versus pneumonia. Electronically Signed   By: Rolm Baptise M.D.   On: 04/28/2017 11:47   Dg Chest 2 View  Result Date: 04/27/2017 CLINICAL DATA:  History of congestive heart failure . EXAM: CHEST  2 VIEW COMPARISON:  CT  04/26/2017.  Chest x-ray 04/26/2017. FINDINGS: Cardiomegaly with diffuse bilateral from interstitial prominence and bilateral pleural effusions consistent congestive heart failure. Basilar atelectasis. Nodular density in the left lung base best identified by prior CT. No pneumothorax. IMPRESSION: 1. Congestive heart failure from interstitial edema bilateral pleural effusions. 2. Basilar atelectasis. Electronically Signed   By: Marcello Moores  Register   On: 04/27/2017 16:05     Medications:   . sodium chloride    . azithromycin Stopped (04/29/17 1305)  . cefTRIAXone (ROCEPHIN)  IV Stopped (04/29/17 1050)   . acidophilus  1 capsule Oral Daily  . amLODipine  5 mg Oral Daily  . arformoterol  15 mcg Nebulization BID  . aspirin EC  81 mg Oral Daily  . budesonide (PULMICORT) nebulizer solution  1 mg Nebulization BID  . calcium acetate  2,001 mg Oral TID WC  . docusate sodium  200 mg Oral Daily  . fluticasone  2 spray Each Nare BID  . furosemide  80 mg Oral Daily  . guaiFENesin  1,200 mg Oral BID  . heparin  5,000 Units Subcutaneous Q8H  . hydrALAZINE  100 mg Oral TID  . insulin aspart  0-5 Units Subcutaneous QHS  . insulin aspart  0-9 Units Subcutaneous TID WC  . insulin aspart  3 Units Subcutaneous TID AC  . insulin glargine  7 Units Subcutaneous Daily  . labetalol  300 mg Oral TID  . levothyroxine  50 mcg Oral QHS  . loratadine  10 mg Oral QODAY  . losartan  100 mg Oral Daily  . magnesium oxide  400 mg Oral BID  . mouth rinse  15 mL Mouth Rinse BID  . metoCLOPramide  5 mg Oral QHS  . pantoprazole  40 mg Oral Daily  . predniSONE  10 mg Oral Q breakfast  . pregabalin  75 mg Oral QHS  . rOPINIRole  0.25 mg Oral BID  . sodium chloride flush  3 mL Intravenous Q12H  . sodium chloride flush  3 mL Intravenous Q12H  . tiotropium  1 capsule Inhalation QPM  . tiZANidine  4 mg Oral QHS   sodium chloride, acetaminophen **OR** acetaminophen, albuterol, HYDROcodone-acetaminophen, hydroxypropyl  methylcellulose / hypromellose, labetalol, menthol-cetylpyridinium, ondansetron **OR** ondansetron (ZOFRAN) IV, polyethylene glycol, sodium chloride, sodium chloride flush  Assessment/ Plan:  61 y.o. female with end-stage renal disease, diabetes, with complications of retinopathy, GERD, hyperlipidemia, glaucoma, legal blindness, history of hysterectomy, right foot surgery, atrial fibrillation, COPD, congestive heart failure, pancreatitis, peripheral neuropathy from diabetes, coronary disease with coronary angioplasty and stents, left renal mass that is enlarging  1. ESRD. [employer]. T-T-S-1. [facility] - patient due for hemodialysis today. Orders have been prepared.   2. Shortness of breath/pulmonary edema/bilateral pleural effusions. - improved clinically with extra dialysis. We plan for additional  ultrafiltration today.  3. Anemia of chronic kidney disease -  hgb currently 8.3, can't give epogen given renal mass.   4. Secondary hyperparathyroidism:  Follow-up serum phosphorus today and maintain the patient on current dosage of calcium acetate.  5.  Renal mass: being followed by urology, avoiding epogen given enlarging renal mass.    LOS: 3 Attie Nawabi 10/13/20182:47 PM

## 2017-04-29 NOTE — Progress Notes (Signed)
Pre hd assessment  

## 2017-04-29 NOTE — Progress Notes (Signed)
Hd start, pt alert, no c/o, stable, report from primary rn, pre labs drawn

## 2017-04-30 ENCOUNTER — Inpatient Hospital Stay: Payer: Medicare Other

## 2017-04-30 LAB — BASIC METABOLIC PANEL
ANION GAP: 10 (ref 5–15)
BUN: 22 mg/dL — AB (ref 6–20)
CHLORIDE: 96 mmol/L — AB (ref 101–111)
CO2: 31 mmol/L (ref 22–32)
Calcium: 9.1 mg/dL (ref 8.9–10.3)
Creatinine, Ser: 2.45 mg/dL — ABNORMAL HIGH (ref 0.44–1.00)
GFR calc Af Amer: 23 mL/min — ABNORMAL LOW (ref >60)
GFR calc non Af Amer: 20 mL/min — ABNORMAL LOW (ref >60)
Glucose, Bld: 269 mg/dL — ABNORMAL HIGH (ref 65–99)
POTASSIUM: 3.3 mmol/L — AB (ref 3.5–5.1)
SODIUM: 137 mmol/L (ref 135–145)

## 2017-04-30 LAB — GLUCOSE, CAPILLARY
GLUCOSE-CAPILLARY: 253 mg/dL — AB (ref 65–99)
GLUCOSE-CAPILLARY: 277 mg/dL — AB (ref 65–99)

## 2017-04-30 MED ORDER — AMOXICILLIN-POT CLAVULANATE 875-125 MG PO TABS: 1.0000 | ORAL_TABLET | Freq: Two times a day (BID) | ORAL | 0 refills | Status: AC

## 2017-04-30 NOTE — Progress Notes (Signed)
Report called to  health care, melissa rn. Patients status gone over with staff. Patient to be transport via ems, on 3l. Will call for nonemergency transport. Packet to be given to ems staff

## 2017-04-30 NOTE — Progress Notes (Signed)
[name] to be D/C'd Skilled nursing facility per MD order.  Discussed prescriptions and follow up appointments with the facility staff. Prescriptions placed in packet, medication list explained in detail.   Allergies as of 04/30/2017      Reactions   Shellfish-derived Products    Other reaction(s): Unknown   Doxycycline Rash      Medication List    TAKE these medications   acetaminophen 325 MG tablet Commonly known as:  TYLENOL Take 650 mg by mouth every 6 (six) hours as needed for mild pain.   albuterol (2.5 MG/3ML) 0.083% nebulizer solution Commonly known as:  PROVENTIL Take 2.5 mg by nebulization every 4 (four) hours as needed for wheezing or shortness of breath.   ALIGN 4 MG Caps Take 4 mg by mouth daily.   amLODipine 5 MG tablet Commonly known as:  NORVASC Take 5 mg by mouth twice daily.   amoxicillin-clavulanate 875-125 MG tablet Commonly known as:  AUGMENTIN Take 1 tablet by mouth 2 (two) times daily.   arformoterol 15 MCG/2ML Nebu Commonly known as:  BROVANA Take 15 mcg by nebulization 2 (two) times daily.   aspirin EC 81 MG tablet Take 81 mg by mouth daily.   atorvastatin 20 MG tablet Commonly known as:  LIPITOR Take 20 mg by mouth in the evening.   b complex-vitamin c-folic acid 0.8 MG Tabs tablet Take 1 tablet by mouth daily.   budesonide 1 MG/2ML nebulizer solution Commonly known as:  PULMICORT Take 1 mg by nebulization 2 (two) times daily.   CALCIUM 600+D 600-400 MG-UNIT tablet Generic drug:  Calcium Carbonate-Vitamin D Take 1 tablet by mouth once daily.   calcium acetate 667 MG capsule Commonly known as:  PHOSLO Take 2,001 mg by mouth 3 (three) times daily with meals.   clobetasol cream 0.05 % Commonly known as:  TEMOVATE Apply 1 application topically 2 (two) times daily.   COLACE 100 MG capsule Generic drug:  docusate sodium Take 100 mg by mouth daily.   FLONASE 50 MCG/ACT nasal spray Generic drug:  fluticasone Place 2 sprays  into both nostrils 2 (two) times daily.   furosemide 80 MG tablet Commonly known as:  LASIX Take 80 mg by mouth daily.   guaiFENesin 600 MG 12 hr tablet Commonly known as:  MUCINEX Take 1,200 mg by mouth 2 (two) times daily.   HUMALOG 100 UNIT/ML injection Generic drug:  insulin lispro Inject 10 units subcutaneously before meals every Sunday, Monday, Wednesday and Friday. Inject 5 units subcutaneously three times a day every Tuesday, Thursday and Saturday.   hydrALAZINE 100 MG tablet Commonly known as:  APRESOLINE Take 100 mg by mouth 3 (three) times daily.   HYDROcodone-acetaminophen 5-325 MG tablet Commonly known as:  NORCO/VICODIN Take 1-2 tablets by mouth every 6 (six) hours as needed for moderate pain or severe pain. Give 2 tablets by mouth every 6 hours as needed for severe pain and give 1 tablet by mouth every 6 hours as needed for moderate pain.   hydroxypropyl methylcellulose / hypromellose 2.5 % ophthalmic solution Commonly known as:  ISOPTO TEARS / GONIOVISC Place 2 drops into the right eye every 6 (six) hours as needed for dry eyes (irritation).   labetalol 300 MG tablet Commonly known as:  NORMODYNE Take 300 mg by mouth 3 (three) times daily.   levothyroxine 50 MCG tablet Commonly known as:  SYNTHROID, LEVOTHROID Take 50 mcg by mouth at bedtime.   loratadine 10 MG tablet Commonly known as:  CLARITIN Take 10 mg by mouth every other day.   losartan 100 MG tablet Commonly known as:  COZAAR Take 100 mg by mouth daily.   magnesium oxide 400 MG tablet Commonly known as:  MAG-OX Take 400 mg by mouth 2 (two) times daily.   menthol-cetylpyridinium 3 MG lozenge Commonly known as:  CEPACOL Take 1 lozenge by mouth every 6 (six) hours as needed for sore throat.   metoCLOPramide 5 MG tablet Commonly known as:  REGLAN Take 5 mg by mouth at bedtime for nausea and vomitting.   mometasone 50 MCG/ACT nasal spray Commonly known as:  NASONEX Place 1 spray into the nose  as needed (rhinitis).   oxymetazoline 0.05 % nasal spray Commonly known as:  AFRIN Place 1 spray into both nostrils every 12 (twelve) hours as needed (Nose bleeds).   pantoprazole 40 MG tablet Commonly known as:  PROTONIX Take 40 mg by mouth once daily.   polyethylene glycol packet Commonly known as:  MIRALAX / GLYCOLAX Take 17 g by mouth daily as needed for mild constipation.   predniSONE 10 MG tablet Commonly known as:  DELTASONE Take 10 mg by mouth daily with breakfast.   pregabalin 75 MG capsule Commonly known as:  LYRICA Take 75 mg by mouth at bedtime.   promethazine 25 MG tablet Commonly known as:  PHENERGAN Take 25 mg by mouth every 6 (six) hours as needed for nausea or vomiting.   rOPINIRole 0.25 MG tablet Commonly known as:  REQUIP Take 1 tablet by mouth twice daily. Take 1 tablet by mouth once daily on Sunday, Tuesday and Thursday.   sodium chloride 0.65 % Soln nasal spray Commonly known as:  OCEAN Place 1 spray into both nostrils every 2 (two) hours as needed for congestion.   SPIRIVA RESPIMAT 2.5 MCG/ACT Aers Generic drug:  Tiotropium Bromide Monohydrate Inhale 2 puffs into the lungs every evening.   tiZANidine 4 MG tablet Commonly known as:  ZANAFLEX Take 4 mg by mouth at bedtime.   TOUJEO SOLOSTAR 300 UNIT/ML Sopn Generic drug:  Insulin Glargine Inject 10 Units into the skin daily.       Vitals:   04/30/17 0755 04/30/17 1159  BP: (!) 153/56 (!) 113/54  Pulse: 84 74  Resp: 20 18  Temp: 98.4 F (36.9 C) 98.3 F (36.8 C)  SpO2: 100% 100%    Skin clean, dry and intact without evidence of skin break down, no evidence of skin tears noted. IV catheter discontinued intact. Site without signs and symptoms of complications. Dressing and pressure applied. Pt denies pain at this time. No complaints noted.   Patient escorted via stretcher by ems  Gloriann Riede Sinclair Grooms

## 2017-04-30 NOTE — Progress Notes (Signed)
[facility]  ROUNDING NOTE   Subjective:  Patient had hemodialysis yesterday. She tolerated well. Ultrafiltration achieved was 2 kg.  Objective:  Vital signs in last 24 hours:  Temp:  [97.6 F (36.4 C)-98.6 F (37 C)] 98.3 F (36.8 C) (10/14 1159) Pulse Rate:  [74-84] 74 (10/14 1159) Resp:  [7-20] 18 (10/14 1159) BP: (113-153)/(51-61) 113/54 (10/14 1159) SpO2:  [98 %-100 %] 100 % (10/14 1159) Weight:  [66.9 kg (147 lb 6.4 oz)-68.7 kg (151 lb 7.3 oz)] 66.9 kg (147 lb 6.4 oz) (10/14 0442)  Weight change: 5.469 kg (12 lb 0.9 oz) Filed Weights   04/29/17 0435 04/29/17 1650 04/30/17 0442  Weight: 63.2 kg (139 lb 6.4 oz) 68.7 kg (151 lb 7.3 oz) 66.9 kg (147 lb 6.4 oz)    Intake/Output: I/O last 3 completed shifts: In: 480 [P.O.:480] Out: 2300 [Urine:300; Other:2000]   Intake/Output this shift:  Total I/O In: 540 [P.O.:240; IV Piggyback:300] Out: 250 [Urine:250]  Physical Exam: General: No acute distress  Head: Normocephalic, atraumatic. Moist oral mucosal membranes  Eyes: Anicteric  Neck: Supple, trachea midline  Lungs:  Diminished at bases, normal effort  Heart: S1S2 no rubs  Abdomen:  Soft, nontender, bowel sounds present  Extremities: no peripheral edema.  Neurologic: Awake, alert, following commands  Skin: No lesions  Access: LUE AVF    Basic Metabolic Panel:  Recent Labs Lab 04/26/17 0944 04/26/17 1543 04/27/17 0432 04/27/17 1039 04/29/17 1705 04/30/17 0535  NA 139  --  137  --  134* 137  K 3.3*  --  4.4  --  4.2 3.3*  CL 98*  --  99*  --  95* 96*  CO2 31  --  26  --  28 31  GLUCOSE 87  --  268*  --  270* 269*  BUN 24*  --  22*  --  51* 22*  CREATININE 3.28*  --  2.37*  --  4.02* 2.45*  CALCIUM 8.9  --  8.9  --  9.6 9.1  PHOS  --  3.9  --  2.3* 1.7*  --     Liver Function Tests:  Recent Labs Lab 04/29/17 1705  ALBUMIN 3.2*    Recent Labs Lab 04/26/17 0944  LIPASE 15   No results for input(s): AMMONIA in the last 168  hours.  CBC:  Recent Labs Lab 04/26/17 0944 04/27/17 0432 04/29/17 1705  WBC 8.5 7.4 6.7  NEUTROABS 5.6  --   --   HGB 8.2* 8.3* 7.6*  HCT 24.4* 24.9* 23.5*  MCV 90.3 91.9 91.6  PLT 158 140* 145*    Cardiac Enzymes:  Recent Labs Lab 04/26/17 0944  TROPONINI 0.05*    BNP: Invalid input(s): POCBNP  CBG:  Recent Labs Lab 04/29/17 1226 04/29/17 1638 04/29/17 2159 04/30/17 0803 04/30/17 1200  GLUCAP 163* 263* 237* 253* 277*    Microbiology: Results for orders placed or performed during the hospital encounter of 04/26/17  MRSA PCR Screening     Status: None   Collection Time: 04/27/17  2:05 AM  Result Value Ref Range Status   MRSA by PCR NEGATIVE NEGATIVE Final    Comment:        The GeneXpert MRSA Assay (FDA approved for NASAL specimens only), is one component of a comprehensive MRSA colonization surveillance program. It is not intended to diagnose MRSA infection nor to guide or monitor treatment for MRSA infections.     Coagulation Studies: No results for input(s): LABPROT, INR in the  last 72 hours.  Urinalysis: No results for input(s): COLORURINE, LABSPEC, PHURINE, GLUCOSEU, HGBUR, BILIRUBINUR, KETONESUR, PROTEINUR, UROBILINOGEN, NITRITE, LEUKOCYTESUR in the last 72 hours.  Invalid input(s): APPERANCEUR    Imaging: Dg Chest 1 View  Result Date: 04/30/2017 CLINICAL DATA:  Pleural effusion. EXAM: CHEST 1 VIEW COMPARISON:  PA and lateral chest 04/28/2017 and 04/27/2017. FINDINGS: Moderate left and small right pleural effusions are again seen. Left pleural effusion appears slightly increased. Lung volumes are low. Bilateral airspace disease has progressed. No pneumothorax. IMPRESSION: Some increase in a moderate left pleural effusion. Small right pleural effusion is unchanged. Increased bilateral airspace disease could reflect progressive pneumonia or edema. Electronically Signed   By: Inge Rise M.D.   On: 04/30/2017 10:19     Medications:    . sodium chloride    . cefTRIAXone (ROCEPHIN)  IV Stopped (04/30/17 1054)   . acidophilus  1 capsule Oral Daily  . amLODipine  5 mg Oral Daily  . arformoterol  15 mcg Nebulization BID  . aspirin EC  81 mg Oral Daily  . budesonide (PULMICORT) nebulizer solution  1 mg Nebulization BID  . calcium acetate  2,001 mg Oral TID WC  . docusate sodium  200 mg Oral Daily  . fluticasone  2 spray Each Nare BID  . furosemide  80 mg Oral Daily  . guaiFENesin  1,200 mg Oral BID  . heparin  5,000 Units Subcutaneous Q8H  . hydrALAZINE  100 mg Oral TID  . insulin aspart  0-5 Units Subcutaneous QHS  . insulin aspart  0-9 Units Subcutaneous TID WC  . insulin aspart  3 Units Subcutaneous TID AC  . insulin glargine  7 Units Subcutaneous Daily  . labetalol  300 mg Oral TID  . levothyroxine  50 mcg Oral QHS  . loratadine  10 mg Oral QODAY  . losartan  100 mg Oral Daily  . magnesium oxide  400 mg Oral BID  . mouth rinse  15 mL Mouth Rinse BID  . metoCLOPramide  5 mg Oral QHS  . pantoprazole  40 mg Oral Daily  . predniSONE  10 mg Oral Q breakfast  . pregabalin  75 mg Oral QHS  . rOPINIRole  0.25 mg Oral BID  . sodium chloride flush  3 mL Intravenous Q12H  . sodium chloride flush  3 mL Intravenous Q12H  . tiotropium  1 capsule Inhalation QPM  . tiZANidine  4 mg Oral QHS   sodium chloride, acetaminophen **OR** acetaminophen, albuterol, HYDROcodone-acetaminophen, hydroxypropyl methylcellulose / hypromellose, labetalol, menthol-cetylpyridinium, ondansetron **OR** ondansetron (ZOFRAN) IV, polyethylene glycol, sodium chloride, sodium chloride flush  Assessment/ Plan:  61 y.o. female with end-stage renal disease, diabetes, with complications of retinopathy, GERD, hyperlipidemia, glaucoma, legal blindness, history of hysterectomy, right foot surgery, atrial fibrillation, COPD, congestive heart failure, pancreatitis, peripheral neuropathy from diabetes, coronary disease with coronary angioplasty and stents,  left renal mass that is enlarging  1. ESRD. [employer]. T-T-S-1. [facility] - patient completed hemodialysis yesterday. She will resume her normal outpatient schedule on Tuesday.   2. Shortness of breath/pulmonary edema/bilateral pleural effusions. - overall significantly improved over the course of thi hospitalizatn. The patient had several extra dialysis treatments this admission.  3. Anemia of chronic kidney disease -  Hemoglobin did come down to 7.6 yesterday. She cannot take Epogen given her underlying renal mass. We will plan to follow CBC and transfuse as necessary.   4. Secondary hyperparathyroidism:  Continue the patient on calcium acetate.  5.  Renal mass:  being followed by urology, avoiding epogen given enlarging renal mass.    LOS: 4 [name] 10/14/20181:35 PM

## 2017-04-30 NOTE — Discharge Summary (Signed)
[place] [facility] [place] NAME: [name]    MR#:  [id number]  DATE OF BIRTH:  [birth year]  DATE OF ADMISSION:  04/26/2017 ADMITTING PHYSICIAN: Hillary Bow, MD  DATE OF DISCHARGE: 04/30/2017  PRIMARY CARE PHYSICIAN: Marden Noble, MD    ADMISSION DIAGNOSIS:  End stage renal disease ([place]) [N18.6] Acute on chronic systolic congestive heart failure ([name]) [I50.23]  DISCHARGE DIAGNOSIS:  Active Problems:   CHF (congestive heart failure) ([name])   SECONDARY DIAGNOSIS:   Past Medical History:  Diagnosis Date  . Atrial fibrillation ([name])   . CAD (coronary artery disease)   . CHF (congestive heart failure) ([name])   . CKD (chronic kidney disease)   . COPD (chronic obstructive pulmonary disease) ([name])   . Diabetes ([name])   . Diabetic peripheral neuropathy associated with type 2 diabetes mellitus ([name])   . Dialysis patient ([name])   . GERD (gastroesophageal reflux disease)   . HTN (hypertension)   . MI (myocardial infarction) ([name]) 05-17-2014  . Pancreatitis   . Peripheral autonomic neuropathy due to diabetes mellitus ([name])   . Renal insufficiency    Pt is on dialysis Tuesday, Thursday and Saturday.    HOSPITAL COURSE:   61 year old female with end-stage renal disease on hemodialysis who presented with shortness of breath.  1. Acute on chronic hypoxic respiratory failure in the setting of fluid overload due to end-stage renal disease and chronic diastolic heart failure Patient is now on baseline oxygen  2. ESRD on hemodialysis: She underwent routine hemodialysis.  3. Bilateral pleural effusions left greater than right colon She is Asymptomatic at this point. She will have outpatient follow-up with her pulmonologist If this becomes an issue then would consider thoracentesis.   4. CAP: She was on Rocephin and azithromycin. She will be discharged on Augmentin. 5. Chronic left renal mass: Patient will continue outpatient  follow-up  6. Essential hypertension: Continue Norvasc, labetalol, losartan  7. Hypothyroidism: Continue Synthroid  8. Diabetes: continue ADA diet with her outpatient regimen   DISCHARGE CONDITIONS AND DIET:   Stable for discharge on renal diabetic diet  CONSULTS OBTAINED:    DRUG ALLERGIES:   Allergies  Allergen Reactions  . Shellfish-Derived Products     Other reaction(s): Unknown  . Doxycycline Rash    DISCHARGE MEDICATIONS:   Current Discharge Medication List    START taking these medications   Details  amoxicillin-clavulanate (AUGMENTIN) 875-125 MG tablet Take 1 tablet by mouth 2 (two) times daily. Qty: 16 tablet, Refills: 0      CONTINUE these medications which have NOT CHANGED   Details  acetaminophen (TYLENOL) 325 MG tablet Take 650 mg by mouth every 6 (six) hours as needed for mild pain.    albuterol (PROVENTIL) (2.5 MG/3ML) 0.083% nebulizer solution Take 2.5 mg by nebulization every 4 (four) hours as needed for wheezing or shortness of breath.    amLODipine (NORVASC) 5 MG tablet Take 5 mg by mouth twice daily.   Associated Diagnoses: Renal mass    arformoterol (BROVANA) 15 MCG/2ML NEBU Take 15 mcg by nebulization 2 (two) times daily.    aspirin EC 81 MG tablet Take 81 mg by mouth daily.    atorvastatin (LIPITOR) 20 MG tablet Take 20 mg by mouth in the evening.   Associated Diagnoses: Renal mass    b complex-vitamin c-folic acid (NEPHRO-VITE) 0.8 MG TABS tablet Take 1 tablet by mouth daily.    budesonide (PULMICORT) 1 MG/2ML nebulizer solution Take 1 mg  by nebulization 2 (two) times daily.    calcium acetate (PHOSLO) 667 MG capsule Take 2,001 mg by mouth 3 (three) times daily with meals.    Calcium Carbonate-Vitamin D (CALCIUM 600+D) 600-400 MG-UNIT tablet Take 1 tablet by mouth once daily.    clobetasol cream (TEMOVATE) 3.50 % Apply 1 application topically 2 (two) times daily.    docusate sodium (COLACE) 100 MG capsule Take 100 mg by mouth  daily.    fluticasone (FLONASE) 50 MCG/ACT nasal spray Place 2 sprays into both nostrils 2 (two) times daily.    furosemide (LASIX) 80 MG tablet Take 80 mg by mouth daily.    guaiFENesin (MUCINEX) 600 MG 12 hr tablet Take 1,200 mg by mouth 2 (two) times daily.    hydrALAZINE (APRESOLINE) 100 MG tablet Take 100 mg by mouth 3 (three) times daily.     HYDROcodone-acetaminophen (NORCO/VICODIN) 5-325 MG tablet Take 1-2 tablets by mouth every 6 (six) hours as needed for moderate pain or severe pain. Give 2 tablets by mouth every 6 hours as needed for severe pain and give 1 tablet by mouth every 6 hours as needed for moderate pain.    hydroxypropyl methylcellulose / hypromellose (ISOPTO TEARS / GONIOVISC) 2.5 % ophthalmic solution Place 2 drops into the right eye every 6 (six) hours as needed for dry eyes (irritation).    Insulin Glargine (TOUJEO SOLOSTAR) 300 UNIT/ML SOPN Inject 10 Units into the skin daily.     insulin lispro (HUMALOG) 100 UNIT/ML injection Inject 10 units subcutaneously before meals every Sunday, Monday, Wednesday and Friday. Inject 5 units subcutaneously three times a day every Tuesday, Thursday and Saturday.    labetalol (NORMODYNE) 300 MG tablet Take 300 mg by mouth 3 (three) times daily.     levothyroxine (SYNTHROID, LEVOTHROID) 50 MCG tablet Take 50 mcg by mouth at bedtime.    loratadine (CLARITIN) 10 MG tablet Take 10 mg by mouth every other day.    Associated Diagnoses: Renal mass    losartan (COZAAR) 100 MG tablet Take 100 mg by mouth daily.     magnesium oxide (MAG-OX) 400 MG tablet Take 400 mg by mouth 2 (two) times daily.     menthol-cetylpyridinium (CEPACOL) 3 MG lozenge Take 1 lozenge by mouth every 6 (six) hours as needed for sore throat.    metoCLOPramide (REGLAN) 5 MG tablet Take 5 mg by mouth at bedtime for nausea and vomitting.    mometasone (NASONEX) 50 MCG/ACT nasal spray Place 1 spray into the nose as needed (rhinitis).    oxymetazoline (AFRIN)  0.05 % nasal spray Place 1 spray into both nostrils every 12 (twelve) hours as needed (Nose bleeds).    pantoprazole (PROTONIX) 40 MG tablet Take 40 mg by mouth once daily.    polyethylene glycol (MIRALAX / GLYCOLAX) packet Take 17 g by mouth daily as needed for mild constipation.    predniSONE (DELTASONE) 10 MG tablet Take 10 mg by mouth daily with breakfast.    pregabalin (LYRICA) 75 MG capsule Take 75 mg by mouth at bedtime.    Probiotic Product (ALIGN) 4 MG CAPS Take 4 mg by mouth daily.    promethazine (PHENERGAN) 25 MG tablet Take 25 mg by mouth every 6 (six) hours as needed for nausea or vomiting.   Associated Diagnoses: Renal mass    rOPINIRole (REQUIP) 0.25 MG tablet Take 1 tablet by mouth twice daily. Take 1 tablet by mouth once daily on Sunday, Tuesday and Thursday.   Associated Diagnoses: Renal mass  sodium chloride (OCEAN) 0.65 % SOLN nasal spray Place 1 spray into both nostrils every 2 (two) hours as needed for congestion.    Tiotropium Bromide Monohydrate (SPIRIVA RESPIMAT) 2.5 MCG/ACT AERS Inhale 2 puffs into the lungs every evening.    tiZANidine (ZANAFLEX) 4 MG tablet Take 4 mg by mouth at bedtime.          Today   CHIEF COMPLAINT:  Patient is doing well this morning. No shortness of breath.   VITAL SIGNS:  Blood pressure (!) 153/56, pulse 84, temperature 98.4 F (36.9 C), temperature source Oral, resp. rate 20, height 5\' 1"  (1.549 m), weight 66.9 kg (147 lb 6.4 oz), SpO2 100 %.   REVIEW OF SYSTEMS:  Review of Systems  Constitutional: Negative.  Negative for chills, fever and malaise/fatigue.  HENT: Negative.  Negative for ear discharge, ear pain, hearing loss, nosebleeds and sore throat.   Eyes: Negative.  Negative for blurred vision and pain.  Respiratory: Negative.  Negative for cough, hemoptysis, shortness of breath and wheezing.   Cardiovascular: Negative.  Negative for chest pain, palpitations and leg swelling.  Gastrointestinal: Negative.   Negative for abdominal pain, blood in stool, diarrhea, nausea and vomiting.  Genitourinary: Negative.  Negative for dysuria.  Musculoskeletal: Negative.  Negative for back pain.  Skin: Negative.   Neurological: Negative for dizziness, tremors, speech change, focal weakness, seizures and headaches.  Endo/Heme/Allergies: Negative.  Does not bruise/bleed easily.  Psychiatric/Behavioral: Negative.  Negative for depression, hallucinations and suicidal ideas.     PHYSICAL EXAMINATION:  GENERAL:  61 y.o.-year-old patient lying in the bed with no acute distress.  NECK:  Supple, no jugular venous distention. No thyroid enlargement, no tenderness.  LUNGS: left base decreased breath sounds, no wheezing, rales,rhonchi  No use of accessory muscles of respiration.  CARDIOVASCULAR: S1, S2 normal. No murmurs, rubs, or gallops.  ABDOMEN: Soft, non-tender, non-distended. Bowel sounds present. No organomegaly or mass.  EXTREMITIES: No pedal edema, cyanosis, or clubbing.  PSYCHIATRIC: The patient is alert and oriented x 3.  SKIN: No obvious rash, lesion, or ulcer.   DATA REVIEW:   CBC  Recent Labs Lab 04/29/17 1705  WBC 6.7  HGB 7.6*  HCT 23.5*  PLT 145*    Chemistries   Recent Labs Lab 04/30/17 0535  NA 137  K 3.3*  CL 96*  CO2 31  GLUCOSE 269*  BUN 22*  CREATININE 2.45*  CALCIUM 9.1    Cardiac Enzymes  Recent Labs Lab 04/26/17 0944  TROPONINI 0.05*    Microbiology Results  @[email or web address] @  RADIOLOGY:  Dg Chest 1 View  Result Date: 04/30/2017 CLINICAL DATA:  Pleural effusion. EXAM: CHEST 1 VIEW COMPARISON:  PA and lateral chest 04/28/2017 and 04/27/2017. FINDINGS: Moderate left and small right pleural effusions are again seen. Left pleural effusion appears slightly increased. Lung volumes are low. Bilateral airspace disease has progressed. No pneumothorax. IMPRESSION: Some increase in a moderate left pleural effusion. Small right pleural effusion is unchanged. Increased  bilateral airspace disease could reflect progressive pneumonia or edema. Electronically Signed   By: Inge Rise M.D.   On: 04/30/2017 10:19   Dg Chest 2 View  Result Date: 04/28/2017 CLINICAL DATA:  Shortness of Breath EXAM: CHEST  2 VIEW COMPARISON:  04/27/2017 FINDINGS: Moderate to large left effusion and small right effusion, similar prior study. Cardiomegaly. Mild interstitial edema. Somewhat more confluent opacity in the right mid and lower lung. Bibasilar atelectasis. IMPRESSION: Moderate to large left effusion and small right effusion, stable. Bibasilar  atelectasis. Interstitial prominence could reflect interstitial edema. More confluent right lower lobe infiltrate, asymmetric edema versus pneumonia. Electronically Signed   By: Rolm Baptise M.D.   On: 04/28/2017 11:47      Current Discharge Medication List    START taking these medications   Details  amoxicillin-clavulanate (AUGMENTIN) 875-125 MG tablet Take 1 tablet by mouth 2 (two) times daily. Qty: 16 tablet, Refills: 0      CONTINUE these medications which have NOT CHANGED   Details  acetaminophen (TYLENOL) 325 MG tablet Take 650 mg by mouth every 6 (six) hours as needed for mild pain.    albuterol (PROVENTIL) (2.5 MG/3ML) 0.083% nebulizer solution Take 2.5 mg by nebulization every 4 (four) hours as needed for wheezing or shortness of breath.    amLODipine (NORVASC) 5 MG tablet Take 5 mg by mouth twice daily.   Associated Diagnoses: Renal mass    arformoterol (BROVANA) 15 MCG/2ML NEBU Take 15 mcg by nebulization 2 (two) times daily.    aspirin EC 81 MG tablet Take 81 mg by mouth daily.    atorvastatin (LIPITOR) 20 MG tablet Take 20 mg by mouth in the evening.   Associated Diagnoses: Renal mass    b complex-vitamin c-folic acid (NEPHRO-VITE) 0.8 MG TABS tablet Take 1 tablet by mouth daily.    budesonide (PULMICORT) 1 MG/2ML nebulizer solution Take 1 mg by nebulization 2 (two) times daily.    calcium acetate  (PHOSLO) 667 MG capsule Take 2,001 mg by mouth 3 (three) times daily with meals.    Calcium Carbonate-Vitamin D (CALCIUM 600+D) 600-400 MG-UNIT tablet Take 1 tablet by mouth once daily.    clobetasol cream (TEMOVATE) 8.11 % Apply 1 application topically 2 (two) times daily.    docusate sodium (COLACE) 100 MG capsule Take 100 mg by mouth daily.    fluticasone (FLONASE) 50 MCG/ACT nasal spray Place 2 sprays into both nostrils 2 (two) times daily.    furosemide (LASIX) 80 MG tablet Take 80 mg by mouth daily.    guaiFENesin (MUCINEX) 600 MG 12 hr tablet Take 1,200 mg by mouth 2 (two) times daily.    hydrALAZINE (APRESOLINE) 100 MG tablet Take 100 mg by mouth 3 (three) times daily.     HYDROcodone-acetaminophen (NORCO/VICODIN) 5-325 MG tablet Take 1-2 tablets by mouth every 6 (six) hours as needed for moderate pain or severe pain. Give 2 tablets by mouth every 6 hours as needed for severe pain and give 1 tablet by mouth every 6 hours as needed for moderate pain.    hydroxypropyl methylcellulose / hypromellose (ISOPTO TEARS / GONIOVISC) 2.5 % ophthalmic solution Place 2 drops into the right eye every 6 (six) hours as needed for dry eyes (irritation).    Insulin Glargine (TOUJEO SOLOSTAR) 300 UNIT/ML SOPN Inject 10 Units into the skin daily.     insulin lispro (HUMALOG) 100 UNIT/ML injection Inject 10 units subcutaneously before meals every Sunday, Monday, Wednesday and Friday. Inject 5 units subcutaneously three times a day every Tuesday, Thursday and Saturday.    labetalol (NORMODYNE) 300 MG tablet Take 300 mg by mouth 3 (three) times daily.     levothyroxine (SYNTHROID, LEVOTHROID) 50 MCG tablet Take 50 mcg by mouth at bedtime.    loratadine (CLARITIN) 10 MG tablet Take 10 mg by mouth every other day.    Associated Diagnoses: Renal mass    losartan (COZAAR) 100 MG tablet Take 100 mg by mouth daily.     magnesium oxide (MAG-OX) 400 MG tablet Take  400 mg by mouth 2 (two) times daily.      menthol-cetylpyridinium (CEPACOL) 3 MG lozenge Take 1 lozenge by mouth every 6 (six) hours as needed for sore throat.    metoCLOPramide (REGLAN) 5 MG tablet Take 5 mg by mouth at bedtime for nausea and vomitting.    mometasone (NASONEX) 50 MCG/ACT nasal spray Place 1 spray into the nose as needed (rhinitis).    oxymetazoline (AFRIN) 0.05 % nasal spray Place 1 spray into both nostrils every 12 (twelve) hours as needed (Nose bleeds).    pantoprazole (PROTONIX) 40 MG tablet Take 40 mg by mouth once daily.    polyethylene glycol (MIRALAX / GLYCOLAX) packet Take 17 g by mouth daily as needed for mild constipation.    predniSONE (DELTASONE) 10 MG tablet Take 10 mg by mouth daily with breakfast.    pregabalin (LYRICA) 75 MG capsule Take 75 mg by mouth at bedtime.    Probiotic Product (ALIGN) 4 MG CAPS Take 4 mg by mouth daily.    promethazine (PHENERGAN) 25 MG tablet Take 25 mg by mouth every 6 (six) hours as needed for nausea or vomiting.   Associated Diagnoses: Renal mass    rOPINIRole (REQUIP) 0.25 MG tablet Take 1 tablet by mouth twice daily. Take 1 tablet by mouth once daily on Sunday, Tuesday and Thursday.   Associated Diagnoses: Renal mass    sodium chloride (OCEAN) 0.65 % SOLN nasal spray Place 1 spray into both nostrils every 2 (two) hours as needed for congestion.    Tiotropium Bromide Monohydrate (SPIRIVA RESPIMAT) 2.5 MCG/ACT AERS Inhale 2 puffs into the lungs every evening.    tiZANidine (ZANAFLEX) 4 MG tablet Take 4 mg by mouth at bedtime.          Management plans discussed with the patient and she is in agreement. Stable for discharge [facility]  Patient should follow up with pcp  CODE STATUS:     Code Status Orders        Start     Ordered   04/26/17 1326  Do not attempt resuscitation (DNR)  Continuous    Question Answer Comment  In the event of cardiac or respiratory ARREST Do not call a "code blue"   In the event of cardiac or respiratory ARREST Do not perform  Intubation, CPR, defibrillation or ACLS   In the event of cardiac or respiratory ARREST Use medication by any route, position, wound care, and other measures to relive pain and suffering. May use oxygen, suction and manual treatment of airway obstruction as needed for comfort.      10 /10/18 1327    Code Status History    Date Active Date Inactive Code Status Order ID Comments User Context   09/29/2015  4:01 PM 10/02/2015  7:35 PM Full Code [id number]  Vaughan Basta, MD Inpatient   05/20/2014  1:14 PM 05/22/2014  6:54 PM Full Code [id number]  Charolette Forward, MD Inpatient    Advance Directive Documentation     Most Recent Value  Type of Advance Directive  Living will  Pre-existing out of facility DNR order (yellow form or pink MOST form)  -  "MOST" Form in Place?  -      TOTAL TIME TAKING CARE OF THIS PATIENT: 37 minutes.    Note: This dictation was prepared with Dragon dictation along with smaller phrase technology. Any transcriptional errors that result from this process are unintentional.  Charmelle Soh M.D on 04/30/2017 at 11:02 AM  Between 7am to 6pm -  Pager - [phone] After 6pm go to [email or web address] - password [email or web address] [facility]  Office  [phone]  CC: Primary care physician; Marden Noble, MD

## 2017-04-30 NOTE — Progress Notes (Signed)
Personal belongings returned to patient. Waiting for ems to transport back to [facility]

## 2017-05-01 ENCOUNTER — Telehealth: Payer: Self-pay

## 2017-05-01 NOTE — Telephone Encounter (Signed)
Patient states that she does not have transportation at this time and has declined an appointment at this time. Will call back if she changes her mind or secures transportation.

## 2017-05-05 ENCOUNTER — Encounter: Payer: Medicare Other | Attending: Surgery | Admitting: Surgery

## 2017-05-05 DIAGNOSIS — I2510 Atherosclerotic heart disease of native coronary artery without angina pectoris: Secondary | ICD-10-CM | POA: Insufficient documentation

## 2017-05-05 DIAGNOSIS — L97312 Non-pressure chronic ulcer of right ankle with fat layer exposed: Secondary | ICD-10-CM | POA: Diagnosis not present

## 2017-05-05 DIAGNOSIS — I509 Heart failure, unspecified: Secondary | ICD-10-CM | POA: Insufficient documentation

## 2017-05-05 DIAGNOSIS — I132 Hypertensive heart and chronic kidney disease with heart failure and with stage 5 chronic kidney disease, or end stage renal disease: Secondary | ICD-10-CM | POA: Diagnosis not present

## 2017-05-05 DIAGNOSIS — E1140 Type 2 diabetes mellitus with diabetic neuropathy, unspecified: Secondary | ICD-10-CM | POA: Insufficient documentation

## 2017-05-05 DIAGNOSIS — Z992 Dependence on renal dialysis: Secondary | ICD-10-CM | POA: Diagnosis not present

## 2017-05-05 DIAGNOSIS — L97512 Non-pressure chronic ulcer of other part of right foot with fat layer exposed: Secondary | ICD-10-CM | POA: Diagnosis not present

## 2017-05-05 DIAGNOSIS — E11621 Type 2 diabetes mellitus with foot ulcer: Secondary | ICD-10-CM | POA: Insufficient documentation

## 2017-05-05 DIAGNOSIS — I4891 Unspecified atrial fibrillation: Secondary | ICD-10-CM | POA: Diagnosis not present

## 2017-05-05 DIAGNOSIS — Z9861 Coronary angioplasty status: Secondary | ICD-10-CM | POA: Diagnosis not present

## 2017-05-05 DIAGNOSIS — N186 End stage renal disease: Secondary | ICD-10-CM | POA: Insufficient documentation

## 2017-05-05 DIAGNOSIS — E1122 Type 2 diabetes mellitus with diabetic chronic kidney disease: Secondary | ICD-10-CM | POA: Insufficient documentation

## 2017-05-08 NOTE — Progress Notes (Signed)
COUMBA, KELLISON ([id number]) Visit Report for 05/05/2017 Arrival Information Details [name], [name]. Date of Service: 05/05/2017 11:00 AM Medical Record Number: [id number] Patient Account Number: [id number] Date of Birth/Sex: 06-03-1956 (61 y.o. Female) Treating RN: Cornell Barman Primary Care Melania Kirks: [name] Other Clinician: Referring Marvia Troost: [name] Treating Susette Seminara/Extender: Frann Rider in Treatment: 29 Visit Information History Since Last Visit Added or deleted any medications: No Patient Arrived: Wheel Chair Any new allergies or adverse reactions: No Arrival Time: 10:49 Had a fall or experienced change in No Accompanied By: self activities of daily living that may affect Transfer Assistance: None risk of falls: Patient Identification Verified: Yes Signs or symptoms of abuse/neglect since last visito No Secondary Verification Process Yes Hospitalized since last visit: No Completed: Has Dressing in Place as Prescribed: Yes Patient Requires Transmission-Based No Pain Present Now: No Precautions: Patient Has Alerts: Yes Patient Alerts: Patient on Blood Thinner Electronic Signature(s) Signed: 05/05/2017 4:13:04 PM By: Gretta Cool, [name], RN, CWS, Kim RN, [name] Entered By: Gretta Cool, [name], RN, CWS, Kim on 05/05/2017 10:57:43 [name], [name] ([id number]) -------------------------------------------------------------------------------- Clinic Level of Care Assessment Details Patient Name: [name], [name] Date of Service: 05/05/2017 11:00 AM Medical Record Number: [id number] Patient Account Number: [id number] Date of Birth/Sex: 08-30-1955 (61 y.o. Female) Treating RN: Cornell Barman Primary Care Mykayla Brinton: [name] Other Clinician: Referring Perpetua Elling: [name] Treating Ginnette Gates/Extender: Frann Rider in Treatment: 29 Clinic Level of Care Assessment Items TOOL 4 Quantity Score []  - Use when only an EandM is performed on FOLLOW-UP visit  0 ASSESSMENTS - Nursing Assessment / Reassessment []  - Reassessment of Co-morbidities (includes updates in patient status) 0 X- 1 5 Reassessment of Adherence to Treatment Plan ASSESSMENTS - Wound and Skin Assessment / Reassessment X - Simple Wound Assessment / Reassessment - one wound 1 5 []  - 0 Complex Wound Assessment / Reassessment - multiple wounds []  - 0 Dermatologic / Skin Assessment (not related to wound area) ASSESSMENTS - Focused Assessment []  - Circumferential Edema Measurements - multi extremities 0 []  - 0 Nutritional Assessment / Counseling / Intervention []  - 0 Lower Extremity Assessment (monofilament, tuning fork, pulses) []  - 0 Peripheral Arterial Disease Assessment (using hand held doppler) ASSESSMENTS - Ostomy and/or Continence Assessment and Care []  - Incontinence Assessment and Management 0 []  - 0 Ostomy Care Assessment and Management (repouching, etc.) PROCESS - Coordination of Care X - Simple Patient / Family Education for ongoing care 1 15 []  - 0 Complex (extensive) Patient / Family Education for ongoing care []  - 0 Staff obtains Programmer, systems, Records, Test Results / Process Orders []  - 0 Staff telephones HHA, Nursing Homes / Clarify orders / etc []  - 0 Routine Transfer to another Facility (non-emergent condition) []  - 0 Routine Hospital Admission (non-emergent condition) []  - 0 New Admissions / Biomedical engineer / Ordering NPWT, Apligraf, etc. []  - 0 Emergency Hospital Admission (emergent condition) X- 1 10 Simple Discharge Coordination [name], [name] ([id number]) []  - 0 Complex (extensive) Discharge Coordination PROCESS - Special Needs []  - Pediatric / Minor Patient Management 0 []  - 0 Isolation Patient Management []  - 0 Hearing / Language / Visual special needs []  - 0 Assessment of Community assistance (transportation, D/C planning, etc.) []  - 0 Additional assistance / Altered mentation []  - 0 Support Surface(s) Assessment (bed,  cushion, seat, etc.) INTERVENTIONS - Wound Cleansing / Measurement []  - Simple Wound Cleansing - one wound 0 []  - 0 Complex Wound Cleansing - multiple wounds X- 1 5 Wound  Imaging (photographs - any number of wounds) []  - 0 Wound Tracing (instead of photographs) []  - 0 Simple Wound Measurement - one wound []  - 0 Complex Wound Measurement - multiple wounds INTERVENTIONS - Wound Dressings X - Small Wound Dressing one or multiple wounds 2 10 []  - 0 Medium Wound Dressing one or multiple wounds []  - 0 Large Wound Dressing one or multiple wounds []  - 0 Application of Medications - topical []  - 0 Application of Medications - injection INTERVENTIONS - Miscellaneous []  - External ear exam 0 []  - 0 Specimen Collection (cultures, biopsies, blood, body fluids, etc.) []  - 0 Specimen(s) / Culture(s) sent or taken to Lab for analysis []  - 0 Patient Transfer (multiple staff / Civil Service fast streamer / Similar devices) []  - 0 Simple Staple / Suture removal (25 or less) []  - 0 Complex Staple / Suture removal (26 or more) []  - 0 Hypo / Hyperglycemic Management (close monitor of Blood Glucose) []  - 0 Ankle / Brachial Index (ABI) - do not check if billed separately X- 1 5 Vital Signs [name], [name] ([id number]) Has the patient been seen at the hospital within the last three years: Yes Total Score: 65 Level Of Care: New/Established - Level 2 Electronic Signature(s) Signed: 05/05/2017 4:13:04 PM By: Gretta Cool, [name], RN, CWS, Kim RN, [name] Entered By: Gretta Cool, [name], RN, CWS, Kim on 05/05/2017 11:17:11 [name], [name] ([id number]) -------------------------------------------------------------------------------- Encounter Discharge Information Details [name]. Date of Service: 05/05/2017 11:00 AM Medical Record Number: [id number] Patient Account Number: [id number] Date of Birth/Sex: October 09, 1955 (61 y.o. Female) Treating RN: Cornell Barman Primary Care Breland Elders: [name] Other  Clinician: Referring Kisean Rollo: [name] Treating Nevin Grizzle/Extender: Frann Rider in Treatment: 29 Encounter Discharge Information Items Schedule Follow-up Appointment: No Medication Reconciliation completed and No provided to Patient/Care Lewis Keats: Provided on Clinical Summary of Care: 05/05/2017 Form Type Recipient Paper Patient LD Electronic Signature(s) Signed: 05/08/2017 9:18:03 AM By: Ruthine Dose Entered By: Ruthine Dose on 05/05/2017 11:20:45 Heeg, [name] ([id number]) -------------------------------------------------------------------------------- Lower Extremity Assessment Details Patient Name: [name], [name] Date of Service: 05/05/2017 11:00 AM Medical Record Number: [id number] Patient Account Number: [id number] Date of Birth/Sex: August 22, 1955 (61 y.o. Female) Treating RN: Cornell Barman Primary Care Jasia Hiltunen: [name] Other Clinician: Referring Omir Cooprider: [name] Treating Amar Sippel/Extender: Frann Rider in Treatment: 29 Vascular Assessment Pulses: Dorsalis Pedis Palpable: [Right:Yes] Posterior Tibial Extremity colors, hair growth, and conditions: Extremity Color: [Right:Normal] Hair Growth on Extremity: [Right:No] Temperature of Extremity: [Right:Warm] Capillary Refill: [Right:< 3 seconds] Dependent Rubor: [Right:No] Blanched when Elevated: [Right:No] Toe Nail Assessment Left: Right: Thick: No Discolored: No Deformed: No Improper Length and Hygiene: No Electronic Signature(s) Signed: 05/05/2017 4:13:04 PM By: Gretta Cool, [name], RN, CWS, Kim RN, [name] Entered By: Gretta Cool, [name], RN, CWS, Kim on 05/05/2017 11:02:52 Asmar, [name] ([id number]) -------------------------------------------------------------------------------- Multi Wound Chart Details [name]. Date of Service: 05/05/2017 11:00 AM Medical Record Number: [id number] Patient Account Number: [id number] Date of Birth/Sex: 08/01/1955 (61 y.o. 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] [N/A:N/A] Date Acquired: [13:12/14/2016] [N/A:N/A] Weeks of Treatment: [13:11] [N/A:N/A] Wound Status: [13:Healed - Epithelialized] [N/A:N/A] Measurements L x  W x D [13:0x0x0] [N/A:N/A] (cm) Area (cm) : [13:0] [N/A:N/A] Volume (cm) : [13:0] [N/A:N/A] % Reduction in Area: [13:100.00%] [N/A:N/A] % Reduction in Volume: [13:100.00%] [N/A:N/A] Classification: [13:Grade 1] [N/A:N/A] Periwound Skin Texture: [13:No Abnormalities Noted] [N/A:N/A] Periwound Skin Moisture: [13:No Abnormalities Noted] [N/A:N/A] Periwound Skin Color: [13:No Abnormalities Noted No] [N/A:N/A N/A] Treatment Notes Electronic Signature(s) Signed: 05/05/2017 11:24:24 AM By: [name], [name] Entered By: [name] on 05/05/2017 11:24:23 Dralle, [name] ([id number]) -------------------------------------------------------------------------------- Edgar Details [name], [name]. Date of Service: 05/05/2017 11:00 AM Medical Record Number: [id number] Patient Account Number: [id number] Date of Birth/Sex: 1956/02/16 (61 y.o. Female) Treating RN: Cornell Barman Primary Care Delanie Tirrell: [name] Other Clinician: Referring Katelen Luepke: [name] Treating Janijah Symons/Extender: Frann Rider in Treatment: 1 Active Inactive Electronic Signature(s) Signed: 05/05/2017 4:13:04 PM By: Gretta Cool, [name], RN, CWS, Kim RN, [name] Entered By: Gretta Cool, [name], RN,  CWS, Kim on 05/05/2017 11:15:57 [place], [name] ([id number]) -------------------------------------------------------------------------------- Pain Assessment Details [name], [name]. Date of Service: 05/05/2017 11:00 AM Medical Record Number: [id number] Patient Account Number: [id number] Date of Birth/Sex: 1956/05/07 (61 y.o. Female) Treating RN: Cornell Barman Primary Care Ellington Cornia: [name] Other Clinician: Referring Kaidance Pantoja: [name] Treating Seabron Iannello/Extender: Frann Rider in Treatment: 29 Active Problems Location of Pain Severity and Description of Pain Patient Has Paino No Site Locations With Dressing Change: No Pain Management and Medication Current Pain Management: Goals for Pain Management Topical or injectable lidocaine is offered to patient for acute pain when surgical debridement is performed. If needed, Patient is instructed to use over the counter pain medication for the following 24-48 hours after debridement. Wound care MDs do not prescribed pain medications. Patient has chronic pain or uncontrolled pain. Patient has been instructed to make an appointment with their Primary Care Physician for pain management. Electronic Signature(s) Signed: 05/05/2017 4:13:04 PM By: Gretta Cool, [name], RN, CWS, Kim RN, [name] Entered By: Gretta Cool, [name], RN, CWS, Kim on 05/05/2017 10:57:51 Jacquin, [name] ([id number]) -------------------------------------------------------------------------------- Wound Assessment Details [name], [name]. Date of Service: 05/05/2017 11:00 AM Medical Record Number: [id number] Patient Account Number: [id number] Date of Birth/Sex: 10-25-1955 (61 y.o. Female) Treating RN: Cornell Barman Primary Care Libbie Bartley: [name] Other Clinician: Referring Nina Hoar: [name] Treating Orazio Weller/Extender: Frann Rider in Treatment: 29 Wound Status Wound Number: 13 Primary Diabetic Wound/Ulcer of the Lower Etiology:  Extremity Wound Location: Right, Medial Foot Wound Status: Healed - Epithelialized Wounding Event: Gradually Appeared Date Acquired: 12/14/2016 Weeks Of Treatment: 11 Clustered Wound: No Photos Photo Uploaded By: Gretta Cool, [name], RN, CWS, Kim on 05/05/2017 11:25:07 Wound Measurements Length: (cm) 0 % Redu Width: (cm) 0 % Redu Depth: (cm) 0 Area: (cm) 0 Volume: (cm) 0 ction in Area: 100% ction in Volume: 100% Wound Description Classification: Grade 1 Periwound Skin Texture Texture Color No Abnormalities Noted: No No Abnormalities Noted: No Moisture No Abnormalities Noted: No Electronic Signature(s) Signed: 05/05/2017 4:13:04 PM By: Gretta Cool, [name], RN, CWS, Kim RN, [name] Entered By: Gretta Cool, [name], RN, CWS, Kim on 05/05/2017 11:15:38 Girgenti, [name] ([id number]) -------------------------------------------------------------------------------- Vitals Details [name]. Date of Service: 05/05/2017 11:00 AM Medical Record Number: [id number] Patient Account Number: [id number] Date of Birth/Sex: [birth year] (61 y.o. Female) Treating RN: Cornell Barman Primary Care Cabria Micalizzi: [name] Other Clinician: Referring Indy Prestwood: [name] Treating Aliyanah Rozas/Extender: Frann Rider in Treatment: 29 Vital Signs Time Taken: 10:57 Temperature (F): 98.2 Height (in): 61 Pulse (bpm): 91 Weight (lbs): 160 Respiratory Rate (breaths/min): 16 Body Mass  Index (BMI): 30.2 Blood Pressure (mmHg): 129/55 Reference Range: 80 - 120 mg / dl Pulse Oximetry (%): 95 Notes Patient arrives to clinic on 2L O2. Electronic Signature(s) Signed: 05/05/2017 4:13:04 PM By: Gretta Cool, [name], RN, CWS, Kim RN, [name] Entered By: Gretta Cool, [name], RN, CWS, Kim on 05/05/2017 10:59:00

## 2017-05-08 NOTE — Progress Notes (Signed)
VASHTI, BOLANOS ([id number]) Visit Report for 05/05/2017 Chief Complaint Document Details [name], [name]. Date of Service: 05/05/2017 11:00 AM Medical Record Number: [id number] Patient Account Number: [id number] Date of Birth/Sex: April 25, 1956 (61 y.o. Female) Treating RN: Cornell Barman Primary Care Provider: [name] Other Clinician: Referring Provider: [name] Treating Provider/Extender: Frann Rider in Treatment: 29 Information Obtained from: Patient Chief Complaint She is here in follow up evaluation of right foot ulcers Electronic Signature(s) Signed: 05/05/2017 11:24:32 AM By: [name], [name] Entered By: [name] on 05/05/2017 11:24:32 Rickel, [name] ([id number]) -------------------------------------------------------------------------------- HPI Details [name], [name]. Date of Service: 05/05/2017 11:00 AM Medical Record Number: [id number] Patient Account Number: [id number] Date of Birth/Sex: May 13, 1956 (61 y.o. Female) Treating RN: Cornell Barman Primary Care Provider: [name] Other Clinician: Referring Provider: [name] Treating Provider/Extender: Frann Rider in Treatment: 29 History of Present Illness Location: right lateral ankle Quality: Patient reports No Pain. Severity: Patient states wound (s) are getting better. Duration: Patient has had the wound for > 1 months prior to seeking treatment at the wound center Context: The wound appeared gradually over time, and she thinks she might have hit it with a walker Modifying Factors: Patient is currently on renal dialysis and receives treatments 3 times weekly HPI Description: 62 year old patient known to our practice from several previous visits for various problems now comes back with an ulcerated area on her right lateral malleolus which she's had for about a month and she thinks she may have hit it with her walker. No other problems and no x-ray of this  has been taken. She has a past medical history of atrial fibrillation, carotid artery disease, CHF, chronic kidney disease on hemodialysis with a functioning AV fistula seen by Dr. Corene Cornea dew, diabetes mellitus with peripheral neuropathy, pancreatitis and is status post abdominal hysterectomy, AV fistula placement in June 2016, back surgery, cholecystectomy, coronary angioplasty,several surgeries for AV fistula, and dialysis catheters. She has never been a smoker. 10/17/2016 -- had a right ankle x-ray which showed no acute pathology of the right ankle. Arterial vascular calcifications are noted and there is generalized osteopenia and demineralization 11/11/16 Patient appears to be doing fairly well with regard to her right ankle wound. She has been tolerating the dressing changes with the Santyl that does seem to have loosened up the slough a bit. She has also been tolerating the debridement's when they have been performed. She tells me that she does tend to lay on her right side and this seems to be aggravating the issue. She does not have a Prevalon boot for use at this facility. Fortunately she is not having significant pain. 01/06/17 on evaluation today patient's right lateral ankle wound appears to be doing fairly well. There was some Slough covering although there is no evidence of infection and she does not seem to have significant pain. She is pleased with how this is progressing compared to where it was in the beginning but wishes it would completely close. 01/20/17 on evaluation today patient's wound appears to be doing better. She did have some Slough covering the wound but overall there did not appear to be a significant amount of erythema and definitely no read streaking from the wound itself. She also did not have significant pain. 01/27/17 on evaluation today patient appears to be doing okay although she unfortunately had a recent fall two days ago and during this fall she injured her  right knee as well as her  left lateral leg where there is a blister there. There is an abrasion over the knee. Unfortunately the knee has been giving her some discomfort and pain. Her right lateral ankle wound does appear to be doing a little bit better in my opinion in regard to the wound bed where I no longer visualize faster today but rather she does have pink granulation tissue. There is no evidence of infection. 02/13/2017 -- about 3 weeks ago she had had a fall and injured the medial part of her right foot which is now a full-fledged ulcerated area with slough. We have established this as a new wound today. 02/20/17 on evaluation today patient's wounds appeared to be doing about the same although the medial actually is slough covered. Fortunately or infection. She has no knowledge of, vomiting, diarrhea to be getting more significant issue such as sepsis and does not appear to have any pain. With that being said she does not appear to be as alert and oriented action has been in the past. This is obviously a mental shift compared to when I had previously seen her. Unfortunately her husband over the past week and I think this may be weighing heavy on her as well. 05/05/2017 -- she had a setback recently and had to be admitted to the hospital for 5 days as she had significant fluid overload and pneumonia. She has now recovered from that well. [name], [name] ([id number]) ===== Old notes: 61 year old patient who is known to be a diabetic and has end-stage renal disease has had several comorbidities including coronary artery disease, hypertension, hyperlipidemia, pancreatitis, anemia, previous history of hysterectomy, cholecystectomy, left- sided salivary gland excision, bilateral cataract surgery,Peritoneal dialysis catheter, hemodialysis catheter. the area on the back has also been caused by instant pressure she used to sleep on a recliner all day and has significant kyphoscoliosis. As far as  the wound on her right lower extremity she's not sure how this blister occurred but she thought it has been there for about 2 weeks. No recent blood investigations available and no recent hemoglobin A1c. 12/30/2014 -- she is an assisted living facility but I believe the nurses that have not followed instructions as she had some cream applied on her back and there was a different dressing. Last week she's had a AV fistula placed on her left forearm. 01/13/2015 -- she has had some localized infection at the port site and she's been on doxycycline for this. 02/05/2015 - he has developed a small blister on her right lower extremity. 02/10/2015 -- she has developed another small blister on her right anterior chest wall in the area where she's had tape for her dialysis access. this may just be injury caused by a tape burn. She had a dermatology opinion and they have taken a biopsy of her skin. She also had a left brachial AV fistula placed this week. ========= 02/27/17- She is here in follow up; states her blood sugars have been low recently, in the 60s; she denies any fever chills or complications with her wounds. She states she is tolerating dressing changes. There is improvement in measurements. 03/06/2017 -- the patient has recently had the death of her husband and is a bit depressed but other than that she has been doing well with his dressing changes Electronic Signature(s) Signed: 05/05/2017 11:25:25 AM By: [name], [name] Entered By: [name] on 05/05/2017 11:25:25 Hilgers, [name] ([id number]) -------------------------------------------------------------------------------- Physical Exam Details Patient Name: Goodreau, 85. Date of Service: 05/05/2017 11:00  AM Medical Record Number: [id number] Patient Account Number: [id number] Date of Birth/Sex: 08-Dec-1955 (61 y.o. Female) Treating RN: Cornell Barman Primary Care Provider: [name] Other Clinician: Referring Provider: [name] Treating Provider/Extender: Frann Rider in Treatment: 29 Constitutional . Pulse regular. Respirations normal and unlabored. Afebrile. . Eyes Nonicteric. Reactive to light. Ears, Nose, Mouth, and Throat Lips, teeth, and gums WNL.Marland Kitchen Moist mucosa without lesions. Neck supple and nontender. No palpable supraclavicular or cervical adenopathy. Normal sized without goiter. Respiratory WNL. No retractions.. Cardiovascular Pedal Pulses WNL. No clubbing, cyanosis or edema. Lymphatic No adneopathy. No adenopathy. No adenopathy. Musculoskeletal Adexa without tenderness or enlargement.. Digits and nails w/o clubbing, cyanosis, infection, petechiae, ischemia, or inflammatory conditions.. Integumentary (Hair, Skin) No suspicious lesions. No crepitus or fluctuance. No peri-wound warmth or erythema. No masses.Marland Kitchen Psychiatric Judgement and insight Intact.. No evidence of depression, anxiety, or agitation.. Notes with wounds on the lateral ankle and the medial foot have completely healed. Electronic Signature(s) Signed: 05/05/2017 11:26:00 AM By: [name], [name] Entered By: [name] on 05/05/2017 11:26:00 Breau, [name] ([id number]) -------------------------------------------------------------------------------- Physician Orders Details [name]. Date of Service: 05/05/2017 11:00 AM Medical Record Number: [id number] Patient Account Number: [id number] Date of Birth/Sex: 1956/02/04 (61 y.o. Female) Treating RN: Cornell Barman Primary Care Provider: [name] Other Clinician: Referring Provider: [name] Treating Provider/Extender: Frann Rider in Treatment: 58 Verbal / Phone Orders: No Diagnosis Coding Discharge From [facility] Services o Discharge from [facility] - treatment complete Electronic Signature(s) Signed: 05/05/2017 12:29:29 PM By: [name], [name] Signed: 05/05/2017 4:13:04 PM By: Gretta Cool, [name], RN, CWS, Kim RN,  [name] Entered By: Gretta Cool, [name], RN, CWS, Kim on 05/05/2017 11:16:37 Culbertson, [name] ([id number]) -------------------------------------------------------------------------------- Problem List Details Patient Name: JADALYNN, BURR. Date of Service: 05/05/2017 11:00 AM Medical Record Number: [id number] Patient Account Number: [id number] Date of Birth/Sex: [birth year] (61 y.o. Female) Treating RN: Cornell Barman Primary Care Provider: [name] Other Clinician: Referring Provider: [name] Treating Provider/Extender: Frann Rider in Treatment: 29 Active Problems ICD-10 Encounter Code Description Active Date Diagnosis E11.621 Type 2 diabetes mellitus with foot ulcer 10/10/2016 Yes L97.312 Non-pressure chronic ulcer of right ankle with fat layer exposed 10/10/2016 Yes N18.6 End stage renal disease 10/10/2016 Yes Z99.2 Dependence on renal dialysis 10/10/2016 Yes L97.512 Non-pressure chronic ulcer of other part of right foot with fat layer 02/13/2017 Yes exposed Inactive Problems Resolved Problems ICD-10 Code Description Active Date Resolved Date S80.211A Abrasion, right knee, initial encounter 01/27/2017 01/27/2017 Electronic Signature(s) Signed: 05/05/2017 11:24:19 AM By: [name], [name] Entered By: [name] on 05/05/2017 11:24:19 Dibella, [name] ([id number]) -------------------------------------------------------------------------------- Progress Note Details Patient Name: [name], [name] Date of Service: 05/05/2017 11:00 AM Medical Record Number: [id number] Patient Account Number: [id number] Date of Birth/Sex: [birth year] (61 y.o. Female) Treating RN: Cornell Barman Primary Care Provider: [name] Other Clinician: Referring Provider: [name] Treating Provider/Extender: Frann Rider in Treatment: 29 Subjective Chief Complaint Information obtained from Patient She is here in follow up evaluation of right foot ulcers History of Present Illness (HPI) The  following HPI elements were documented for the patient's wound: Location: right lateral ankle Quality: Patient reports No Pain. Severity: Patient states wound (s) are getting better. Duration: Patient has had the wound for > 1 months prior to seeking treatment at the wound center Context: The wound appeared gradually over time, and she thinks she might have hit it with a walker Modifying Factors: Patient is currently on renal  dialysis and receives treatments 3 times weekly 61 year old patient known to our practice from several previous visits for various problems now comes back with an ulcerated area on her right lateral malleolus which she's had for about a month and she thinks she may have hit it with her walker. No other problems and no x-ray of this has been taken. She has a past medical history of atrial fibrillation, carotid artery disease, CHF, chronic kidney disease on hemodialysis with a functioning AV fistula seen by Dr. Corene Cornea dew, diabetes mellitus with peripheral neuropathy, pancreatitis and is status post abdominal hysterectomy, AV fistula placement in June 2016, back surgery, cholecystectomy, coronary angioplasty,several surgeries for AV fistula, and dialysis catheters. She has never been a smoker. 10/17/2016 -- had a right ankle x-ray which showed no acute pathology of the right ankle. Arterial vascular calcifications are noted and there is generalized osteopenia and demineralization 11/11/16 Patient appears to be doing fairly well with regard to her right ankle wound. She has been tolerating the dressing changes with the Santyl that does seem to have loosened up the slough a bit. She has also been tolerating the debridement's when they have been performed. She tells me that she does tend to lay on her right side and this seems to be aggravating the issue. She does not have a Prevalon boot for use at this facility. Fortunately she is not having significant pain. 01/06/17 on  evaluation today patient's right lateral ankle wound appears to be doing fairly well. There was some Slough covering although there is no evidence of infection and she does not seem to have significant pain. She is pleased with how this is progressing compared to where it was in the beginning but wishes it would completely close. 01/20/17 on evaluation today patient's wound appears to be doing better. She did have some Slough covering the wound but overall there did not appear to be a significant amount of erythema and definitely no read streaking from the wound itself. She also did not have significant pain. 01/27/17 on evaluation today patient appears to be doing okay although she unfortunately had a recent fall two days ago and during this fall she injured her right knee as well as her left lateral leg where there is a blister there. There is an abrasion over the knee. Unfortunately the knee has been giving her some discomfort and pain. Her right lateral ankle wound does appear to be doing a little bit better in my opinion in regard to the wound bed where I no longer visualize faster today but rather she does have pink granulation tissue. There is no evidence of infection. 02/13/2017 -- about 3 weeks ago she had had a fall and injured the medial part of her right foot which is now a full-fledged ulcerated area with slough. We have established this as a new wound today. [name] ([id number]) 02/20/17 on evaluation today patient's wounds appeared to be doing about the same although the medial actually is slough covered. Fortunately or infection. She has no knowledge of, vomiting, diarrhea to be getting more significant issue such as sepsis and does not appear to have any pain. With that being said she does not appear to be as alert and oriented action has been in the past. This is obviously a mental shift compared to when I had previously seen her. Unfortunately her husband over the past week and  I think this may be weighing heavy on her as well. 05/05/2017 -- she had a  setback recently and had to be admitted to the hospital for 5 days as she had significant fluid overload and pneumonia. She has now recovered from that well. ===== Old notes: 61 year old patient who is known to be a diabetic and has end-stage renal disease has had several comorbidities including coronary artery disease, hypertension, hyperlipidemia, pancreatitis, anemia, previous history of hysterectomy, cholecystectomy, left- sided salivary gland excision, bilateral cataract surgery,Peritoneal dialysis catheter, hemodialysis catheter. the area on the back has also been caused by instant pressure she used to sleep on a recliner all day and has significant kyphoscoliosis. As far as the wound on her right lower extremity she's not sure how this blister occurred but she thought it has been there for about 2 weeks. No recent blood investigations available and no recent hemoglobin A1c. 12/30/2014 -- she is an assisted living facility but I believe the nurses that have not followed instructions as she had some cream applied on her back and there was a different dressing. Last week she's had a AV fistula placed on her left forearm. 01/13/2015 -- she has had some localized infection at the port site and she's been on doxycycline for this. 02/05/2015 - he has developed a small blister on her right lower extremity. 02/10/2015 -- she has developed another small blister on her right anterior chest wall in the area where she's had tape for her dialysis access. this may just be injury caused by a tape burn. She had a dermatology opinion and they have taken a biopsy of her skin. She also had a left brachial AV fistula placed this week. ========= 02/27/17- She is here in follow up; states her blood sugars have been low recently, in the 60s; she denies any fever chills or complications with her wounds. She states she is tolerating  dressing changes. There is improvement in measurements. 03/06/2017 -- the patient has recently had the death of her husband and is a bit depressed but other than that she has been doing well with his dressing changes Objective Constitutional Pulse regular. Respirations normal and unlabored. Afebrile. Vitals Time Taken: 10:57 AM, Height: 61 in, Weight: 160 lbs, BMI: 30.2, Temperature: 98.2 F, Pulse: 91 bpm, Respiratory Rate: 16 breaths/min, Blood Pressure: 129/55 mmHg, Pulse Oximetry: 95 %. General Notes: Patient arrives to clinic on 2L O2. Eyes Nonicteric. Reactive to light. Ears, Nose, Mouth, and Throat Lips, teeth, and gums WNL.Marland Kitchen Moist mucosa without lesions. Colon, [name] ([id number]) Neck supple and nontender. No palpable supraclavicular or cervical adenopathy. Normal sized without goiter. Respiratory WNL. No retractions.. Cardiovascular Pedal Pulses WNL. No clubbing, cyanosis or edema. Lymphatic No adneopathy. No adenopathy. No adenopathy. Musculoskeletal Adexa without tenderness or enlargement.. Digits and nails w/o clubbing, cyanosis, infection, petechiae, ischemia, or inflammatory conditions.Marland Kitchen Psychiatric Judgement and insight Intact.. No evidence of depression, anxiety, or agitation.. General Notes: with wounds on the lateral ankle and the medial foot have completely healed. Integumentary (Hair, Skin) No suspicious lesions. No crepitus or fluctuance. No peri-wound warmth or erythema. No masses.. Wound #13 status is Healed - Epithelialized. Original cause of wound was Gradually Appeared. The wound is located on the Right,Medial Foot. The wound measures 0cm length x 0cm width x 0cm depth; 0cm^2 area and 0cm^3 volume. Assessment Active Problems ICD-10 E11.621 - Type 2 diabetes mellitus with foot ulcer L97.312 - Non-pressure chronic ulcer of right ankle with fat layer exposed N18.6 - End stage renal disease Z99.2 - Dependence on renal dialysis L97.512 - Non-pressure  chronic ulcer of other part of right  foot with fat layer exposed Plan Discharge From [facility] Services: Discharge from [facility] ([id number]) her wounds are completely healed and I have discharged her from the wound care services and will be seeing her back only as needed. She is encouraged to protect this with some bordered foam so that the recently healed scar does not have problems. Electronic Signature(s) Signed: 05/05/2017 11:27:23 AM By: [name], [name] Entered By: [name] on 05/05/2017 11:27:23 Solem, [name] ([id number]) -------------------------------------------------------------------------------- SuperBill Details Patient Name: ANNETTA, DEISS S. Date of Service: 05/05/2017 Medical Record Number: [id number] Patient Account Number: [id number] Date of Birth/Sex: 1955/08/01 (61 y.o. Female) Treating RN: Cornell Barman Primary Care Provider: [name] Other Clinician: Referring Provider: [name] Treating Provider/Extender: Frann Rider in Treatment: 29 Diagnosis Coding ICD-10 Codes Code Description E11.621 Type 2 diabetes mellitus with foot ulcer L97.312 Non-pressure chronic ulcer of right ankle with fat layer exposed N18.6 End stage renal disease Z99.2 Dependence on renal dialysis L97.512 Non-pressure chronic ulcer of other part of right foot with fat layer exposed Facility Procedures CPT4 Code: [id number] Description: [phone] - WOUND CARE VISIT-LEV 2 EST PT Modifier: Quantity: 1 Physician Procedures CPT4 Code: [id number] Description: 54270 - WC PHYS LEVEL 2 - EST PT ICD-10 Diagnosis Description E11.621 Type 2 diabetes mellitus with foot ulcer L97.312 Non-pressure chronic ulcer of right ankle with fat layer expose N18.6 End stage renal disease L97.512 Non-pressure chronic  ulcer of other part of right foot with fat Modifier: d layer exposed Quantity: 1 Electronic Signature(s) Signed: 05/05/2017 2:43:37 PM By:  Gretta Cool, [name], RN, CWS, Kim RN, [name] Signed: 05/05/2017 4:07:58 PM By: [name], [name] Previous Signature: 05/05/2017 11:27:40 AM Version By: [name], [name] Entered By: Gretta Cool, [name], RN, CWS, Kim on 05/05/2017 14:43:37

## 2017-05-31 ENCOUNTER — Ambulatory Visit
Admission: RE | Admit: 2017-05-31 | Discharge: 2017-05-31 | Disposition: A | Discharge status: Home / Self Care | Payer: Medicare Other | Source: Ambulatory Visit | Attending: Internal Medicine | Admitting: Internal Medicine

## 2017-05-31 ENCOUNTER — Other Ambulatory Visit: Payer: Self-pay | Admitting: Internal Medicine

## 2017-05-31 DIAGNOSIS — J90 Pleural effusion, not elsewhere classified: Secondary | ICD-10-CM | POA: Insufficient documentation

## 2017-05-31 DIAGNOSIS — Z09 Encounter for follow-up examination after completed treatment for conditions other than malignant neoplasm: Secondary | ICD-10-CM | POA: Diagnosis present

## 2017-05-31 DIAGNOSIS — I700 Atherosclerosis of aorta: Secondary | ICD-10-CM | POA: Diagnosis not present

## 2017-05-31 DIAGNOSIS — J189 Pneumonia, unspecified organism: Secondary | ICD-10-CM

## 2017-05-31 DIAGNOSIS — Z8701 Personal history of pneumonia (recurrent): Secondary | ICD-10-CM | POA: Insufficient documentation

## 2017-05-31 DIAGNOSIS — I517 Cardiomegaly: Secondary | ICD-10-CM | POA: Diagnosis not present

## 2017-06-29 ENCOUNTER — Encounter: Payer: Self-pay | Admitting: *Deleted

## 2017-06-29 ENCOUNTER — Other Ambulatory Visit: Payer: Self-pay

## 2017-06-29 ENCOUNTER — Emergency Department
Admission: EM | Admit: 2017-06-29 | Discharge: 2017-06-30 | Disposition: A | Discharge status: Home / Self Care | Payer: Medicare Other | Attending: Emergency Medicine | Admitting: Emergency Medicine

## 2017-06-29 ENCOUNTER — Emergency Department: Payer: Medicare Other

## 2017-06-29 DIAGNOSIS — N186 End stage renal disease: Secondary | ICD-10-CM | POA: Diagnosis not present

## 2017-06-29 DIAGNOSIS — Y92129 Unspecified place in nursing home as the place of occurrence of the external cause: Secondary | ICD-10-CM | POA: Diagnosis not present

## 2017-06-29 DIAGNOSIS — I132 Hypertensive heart and chronic kidney disease with heart failure and with stage 5 chronic kidney disease, or end stage renal disease: Secondary | ICD-10-CM | POA: Insufficient documentation

## 2017-06-29 DIAGNOSIS — E11649 Type 2 diabetes mellitus with hypoglycemia without coma: Secondary | ICD-10-CM | POA: Diagnosis not present

## 2017-06-29 DIAGNOSIS — W010XXA Fall on same level from slipping, tripping and stumbling without subsequent striking against object, initial encounter: Secondary | ICD-10-CM | POA: Diagnosis not present

## 2017-06-29 DIAGNOSIS — Y939 Activity, unspecified: Secondary | ICD-10-CM | POA: Diagnosis not present

## 2017-06-29 DIAGNOSIS — Z794 Long term (current) use of insulin: Secondary | ICD-10-CM | POA: Insufficient documentation

## 2017-06-29 DIAGNOSIS — S0990XA Unspecified injury of head, initial encounter: Secondary | ICD-10-CM | POA: Diagnosis present

## 2017-06-29 DIAGNOSIS — Z7982 Long term (current) use of aspirin: Secondary | ICD-10-CM | POA: Insufficient documentation

## 2017-06-29 DIAGNOSIS — I509 Heart failure, unspecified: Secondary | ICD-10-CM | POA: Diagnosis not present

## 2017-06-29 DIAGNOSIS — E1122 Type 2 diabetes mellitus with diabetic chronic kidney disease: Secondary | ICD-10-CM | POA: Insufficient documentation

## 2017-06-29 DIAGNOSIS — Y999 Unspecified external cause status: Secondary | ICD-10-CM | POA: Insufficient documentation

## 2017-06-29 DIAGNOSIS — W19XXXA Unspecified fall, initial encounter: Secondary | ICD-10-CM

## 2017-06-29 DIAGNOSIS — Z992 Dependence on renal dialysis: Secondary | ICD-10-CM | POA: Diagnosis not present

## 2017-06-29 DIAGNOSIS — S0083XA Contusion of other part of head, initial encounter: Secondary | ICD-10-CM | POA: Insufficient documentation

## 2017-06-29 DIAGNOSIS — E162 Hypoglycemia, unspecified: Secondary | ICD-10-CM

## 2017-06-29 LAB — CBC WITH DIFFERENTIAL/PLATELET
Basophils Absolute: 0.1 10*3/uL (ref 0–0.1)
Basophils Relative: 2 %
Eosinophils Absolute: 0.5 10*3/uL (ref 0–0.7)
Eosinophils Relative: 9 %
HCT: 24.6 % — ABNORMAL LOW (ref 35.0–47.0)
Hemoglobin: 8 g/dL — ABNORMAL LOW (ref 12.0–16.0)
LYMPHS ABS: 0.9 10*3/uL — AB (ref 1.0–3.6)
LYMPHS PCT: 16 %
MCH: 30.3 pg (ref 26.0–34.0)
MCHC: 32.5 g/dL (ref 32.0–36.0)
MCV: 93.2 fL (ref 80.0–100.0)
MONOS PCT: 10 %
Monocytes Absolute: 0.6 10*3/uL (ref 0.2–0.9)
NEUTROS PCT: 63 %
Neutro Abs: 3.7 10*3/uL (ref 1.4–6.5)
PLATELETS: 144 10*3/uL — AB (ref 150–440)
RBC: 2.64 MIL/uL — ABNORMAL LOW (ref 3.80–5.20)
RDW: 15.5 % — ABNORMAL HIGH (ref 11.5–14.5)
WBC: 5.9 10*3/uL (ref 3.6–11.0)

## 2017-06-29 LAB — COMPREHENSIVE METABOLIC PANEL
ALT: 43 U/L (ref 14–54)
AST: 59 U/L — AB (ref 15–41)
Albumin: 3.2 g/dL — ABNORMAL LOW (ref 3.5–5.0)
Alkaline Phosphatase: 194 U/L — ABNORMAL HIGH (ref 38–126)
Anion gap: 8 (ref 5–15)
BUN: 19 mg/dL (ref 6–20)
CHLORIDE: 100 mmol/L — AB (ref 101–111)
CO2: 31 mmol/L (ref 22–32)
CREATININE: 2.57 mg/dL — AB (ref 0.44–1.00)
Calcium: 8.4 mg/dL — ABNORMAL LOW (ref 8.9–10.3)
GFR calc non Af Amer: 19 mL/min — ABNORMAL LOW (ref >60)
GFR, EST AFRICAN AMERICAN: 22 mL/min — AB (ref >60)
Glucose, Bld: 102 mg/dL — ABNORMAL HIGH (ref 65–99)
Potassium: 3 mmol/L — ABNORMAL LOW (ref 3.5–5.1)
SODIUM: 139 mmol/L (ref 135–145)
Total Bilirubin: 0.9 mg/dL (ref 0.3–1.2)
Total Protein: 6.7 g/dL (ref 6.5–8.1)

## 2017-06-29 LAB — GLUCOSE, CAPILLARY
Glucose-Capillary: 98 mg/dL (ref 65–99)
Glucose-Capillary: 136 mg/dL — ABNORMAL HIGH (ref 65–99)
Glucose-Capillary: 41 mg/dL — CL (ref 65–99)
Glucose-Capillary: 127 mg/dL — ABNORMAL HIGH (ref 65–99)

## 2017-06-29 LAB — PROTIME-INR
INR: 1.04
PROTHROMBIN TIME: 13.5 s (ref 11.4–15.2)

## 2017-06-29 MED ORDER — DEXTROSE 50 % IV SOLN
1.0000 | Freq: Once | INTRAVENOUS | Status: AC
  Administered 2017-06-29: 50 mL via INTRAVENOUS

## 2017-06-29 MED ORDER — DEXTROSE 50 % IV SOLN
INTRAVENOUS | Status: AC
  Administered 2017-06-29: 50 mL via INTRAVENOUS
  Filled 2017-06-29: qty 50

## 2017-06-29 NOTE — ED Provider Notes (Signed)
-----------------------------------------   11:59 PM on 06/29/2017 -----------------------------------------  I took over care of this patient with Dr. Clearnce Hasten.  At that time patient was pending imaging which is now resulted and is negative.  The plan was to observe for approximately 4 hours to monitor patient's blood glucose.  Patient did have another dip in her glucose to the 40s a few hours ago, although the last few readings have been stable.  On reassessment, patient states that she still feels somewhat tired and is worried that she may fall again, but otherwise denies acute complaints.  She has a history of CHF, and the x-ray is consistent with prior; patient denies feeling more short of breath than usual currently.  At this time, I feel the patient overall is likely safe to go back to her facility within the next few hours.  Given the recurrent hypoglycemic episode here, and based on discussion with the patient we will observe for a few more hours.  I will sign the patient out to Dr. Owens Shark who is the oncoming physician for the overnight.  If patient's blood glucose remained stable over the next few hours and her mental status does not change, she can be discharged to her facility.   Arta Silence, MD 07/04/2017 0000

## 2017-06-29 NOTE — ED Notes (Signed)
Pt currently eating applesauce.

## 2017-06-29 NOTE — ED Notes (Signed)
CBG 127 

## 2017-06-29 NOTE — ED Notes (Signed)
Pt has finished graham crackers and peanut butter and is now drinking chocolate milk.

## 2017-06-29 NOTE — ED Notes (Signed)
Pt currently eating graham crackers and peanut butter.

## 2017-06-29 NOTE — ED Triage Notes (Signed)
PT to ED after an unwitnessed fall at [facility] care. PT is a hight fall risk and reports her food was placed out of reach and she woke from a nap and felt her sugars dropping. PT reports she reached for the food and fell from bed. PT has old injuries noted from past falls but today reports head pain and having hit head during fall. No neuro deficit noted. RN at facility did not know a baseline to tell EMS.   EMS reports they gave pt 1 amp of D50 due to hypoglycemia and CBG was 186 after amp administration. PT was also given 200 cc NS in ambulance.

## 2017-06-29 NOTE — ED Notes (Signed)
Pt sleeping with half graham cracker in mouth. PT woke to her name being called. Pt appears as though CBG has dropped.

## 2017-06-29 NOTE — ED Provider Notes (Signed)
[facility] Emergency Department Provider Note  ____________________________________________   None    (approximate)  I have reviewed the triage vital signs and the nursing notes.   HISTORY  Chief Complaint Fall and Hypoglycemia   HPI [name] is a 61 y.o. female with a history of atrial fibrillation as well as ESRD on dialysis last dialyzed today who is presenting after a fall.  The patient says that she was feeling like her sugar was going low and so she went across the room to get her dinner tray when she fell.  EMS was called and when they arrived they found the patient to have a glucose of 42.  Given D50 with resolution of mental status.  The patient returned to her baseline mental status.  Is denying any pain at this time.   Past Medical History:  Diagnosis Date  . Atrial fibrillation ([place])   . CAD (coronary artery disease)   . CHF (congestive heart failure) ([place])   . CKD (chronic kidney disease)   . COPD (chronic obstructive pulmonary disease) ([place])   . Diabetes ([place])   . Diabetic peripheral neuropathy associated with type 2 diabetes mellitus ([place])   . Dialysis patient ([place])   . GERD (gastroesophageal reflux disease)   . HTN (hypertension)   . MI (myocardial infarction) ([place]) 05-17-2014  . Pancreatitis   . Peripheral autonomic neuropathy due to diabetes mellitus ([place])   . Renal insufficiency    Pt is on dialysis Tuesday, Thursday and Saturday.    Patient Active Problem List   Diagnosis Date Noted  . CHF (congestive heart failure) ([place]) 04/26/2017  . Closed fracture of surgical neck of humerus 12/02/2016  . Closed fracture of greater tuberosity of humerus 12/02/2016  . Essential hypertension, benign 08/19/2016  . Hyperlipidemia 08/19/2016  . ESRD on dialysis ([place]) 08/19/2016  . Non-pressure chronic ulcer of back, limited to breakdown of skin ([place]) 01/29/2016  . Pneumonia 09/29/2015  . Stage II pressure ulcer of right upper back  09/04/2015  . Stage II pressure ulcer of back 07/17/2015  . Acute coronary syndrome ([place]) 05/20/2014    Past Surgical History:  Procedure Laterality Date  . ABDOMINAL HYSTERECTOMY    . AV FISTULA PLACEMENT Left 12/25/2014   Procedure: ARTERIOVENOUS (AV) FISTULA CREATION;  Surgeon: Algernon Huxley, MD;  Location: [facility] ORS;  Service: Vascular;  Laterality: Left;  . BACK SURGERY    . CHOLECYSTECTOMY    . CORONARY ANGIOPLASTY     Cardiac stents  . EYE SURGERY     bilateral cataract extractions  . IR RADIOLOGIST EVAL & MGMT  12/14/2016  . LEFT HEART CATHETERIZATION WITH CORONARY ANGIOGRAM N/A 05/20/2014   Procedure: LEFT HEART CATHETERIZATION WITH CORONARY ANGIOGRAM;  Surgeon: Clent Demark, MD;  Location: [facility] CATH LAB;  Service: Cardiovascular;  Laterality: N/A;  . PERIPHERAL VASCULAR CATHETERIZATION N/A 02/12/2015   Procedure: A/V Shuntogram/Fistulagram;  Surgeon: Algernon Huxley, MD;  Location: [facility] CV LAB;  Service: Cardiovascular;  Laterality: N/A;  . PERIPHERAL VASCULAR CATHETERIZATION N/A 02/12/2015   Procedure: A/V Shunt Intervention;  Surgeon: Algernon Huxley, MD;  Location: [facility] CV LAB;  Service: Cardiovascular;  Laterality: N/A;  . PERIPHERAL VASCULAR CATHETERIZATION N/A 04/09/2015   Procedure: Dialysis/Perma Catheter Removal;  Surgeon: Algernon Huxley, MD;  Location: [facility] CV LAB;  Service: Cardiovascular;  Laterality: N/A;  . PORTACATH PLACEMENT      Prior to Admission medications   Medication Sig Start Date End Date  Taking? Authorizing Provider  acetaminophen (TYLENOL) 325 MG tablet Take 650 mg by mouth every 6 (six) hours as needed for mild pain.    [provider]  albuterol (PROVENTIL) (2.5 MG/3ML) 0.083% nebulizer solution Take 2.5 mg by nebulization every 4 (four) hours as needed for wheezing or shortness of breath.    [provider]  amLODipine (NORVASC) 5 MG tablet Take 5 mg by mouth twice daily.    [provider]    amoxicillin-clavulanate (AUGMENTIN) 875-125 MG tablet Take 1 tablet by mouth 2 (two) times daily. 04/30/17   Bettey Costa, MD  arformoterol (BROVANA) 15 MCG/2ML NEBU Take 15 mcg by nebulization 2 (two) times daily.    [provider]  aspirin EC 81 MG tablet Take 81 mg by mouth daily.    [provider]  atorvastatin (LIPITOR) 20 MG tablet Take 20 mg by mouth in the evening.    [provider]  b complex-vitamin c-folic acid (NEPHRO-VITE) 0.8 MG TABS tablet Take 1 tablet by mouth daily.    [provider]  budesonide (PULMICORT) 1 MG/2ML nebulizer solution Take 1 mg by nebulization 2 (two) times daily.    [provider]  calcium acetate (PHOSLO) 667 MG capsule Take 2,001 mg by mouth 3 (three) times daily with meals.    [provider]  Calcium Carbonate-Vitamin D (CALCIUM 600+D) 600-400 MG-UNIT tablet Take 1 tablet by mouth once daily. 12/06/14   [provider]  clobetasol cream (TEMOVATE) 8.34 % Apply 1 application topically 2 (two) times daily.    [provider]  docusate sodium (COLACE) 100 MG capsule Take 100 mg by mouth daily.    [provider]  fluticasone (FLONASE) 50 MCG/ACT nasal spray Place 2 sprays into both nostrils 2 (two) times daily.    [provider]  furosemide (LASIX) 80 MG tablet Take 80 mg by mouth daily.    [provider]  guaiFENesin (MUCINEX) 600 MG 12 hr tablet Take 1,200 mg by mouth 2 (two) times daily.    [provider]  hydrALAZINE (APRESOLINE) 100 MG tablet Take 100 mg by mouth 3 (three) times daily.     [provider]  HYDROcodone-acetaminophen (NORCO/VICODIN) 5-325 MG tablet Take 1-2 tablets by mouth every 6 (six) hours as needed for moderate pain or severe pain. Give 2 tablets by mouth every 6 hours as needed for severe pain and give 1 tablet by mouth every 6 hours as needed for moderate pain.    [provider]  hydroxypropyl  methylcellulose / hypromellose (ISOPTO TEARS / GONIOVISC) 2.5 % ophthalmic solution Place 2 drops into the right eye every 6 (six) hours as needed for dry eyes (irritation).    [provider]  Insulin Glargine (TOUJEO SOLOSTAR) 300 UNIT/ML SOPN Inject 10 Units into the skin daily.     [provider]  insulin lispro (HUMALOG) 100 UNIT/ML injection Inject 10 units subcutaneously before meals every Sunday, Monday, Wednesday and Friday. Inject 5 units subcutaneously three times a day every Tuesday, Thursday and Saturday.    [provider]  labetalol (NORMODYNE) 300 MG tablet Take 300 mg by mouth 3 (three) times daily.     [provider]  levothyroxine (SYNTHROID, LEVOTHROID) 50 MCG tablet Take 50 mcg by mouth at bedtime.    [provider]  loratadine (CLARITIN) 10 MG tablet Take 10 mg by mouth every other day.     [provider]  losartan (COZAAR) 100 MG tablet Take 100  mg by mouth daily.     [provider]  magnesium oxide (MAG-OX) 400 MG tablet Take 400 mg by mouth 2 (two) times daily.     [provider]  menthol-cetylpyridinium (CEPACOL) 3 MG lozenge Take 1 lozenge by mouth every 6 (six) hours as needed for sore throat.    [provider]  metoCLOPramide (REGLAN) 5 MG tablet Take 5 mg by mouth at bedtime for nausea and vomitting.    [provider]  mometasone (NASONEX) 50 MCG/ACT nasal spray Place 1 spray into the nose as needed (rhinitis).    [provider]  oxymetazoline (AFRIN) 0.05 % nasal spray Place 1 spray into both nostrils every 12 (twelve) hours as needed (Nose bleeds).    [provider]  pantoprazole (PROTONIX) 40 MG tablet Take 40 mg by mouth once daily.    [provider]  polyethylene glycol (MIRALAX / GLYCOLAX) packet Take 17 g by mouth daily as needed for mild constipation.    [provider]  predniSONE (DELTASONE) 10 MG tablet Take 10 mg by mouth  daily with breakfast.    [provider]  pregabalin (LYRICA) 75 MG capsule Take 75 mg by mouth at bedtime.    [provider]  Probiotic Product (ALIGN) 4 MG CAPS Take 4 mg by mouth daily.    [provider]  promethazine (PHENERGAN) 25 MG tablet Take 25 mg by mouth every 6 (six) hours as needed for nausea or vomiting.    [provider]  rOPINIRole (REQUIP) 0.25 MG tablet Take 1 tablet by mouth twice daily. Take 1 tablet by mouth once daily on Sunday, Tuesday and Thursday.    [provider]  sodium chloride (OCEAN) 0.65 % SOLN nasal spray Place 1 spray into both nostrils every 2 (two) hours as needed for congestion.    [provider]  Tiotropium Bromide Monohydrate (SPIRIVA RESPIMAT) 2.5 MCG/ACT AERS Inhale 2 puffs into the lungs every evening.    [provider]  tiZANidine (ZANAFLEX) 4 MG tablet Take 4 mg by mouth at bedtime.    [provider]    Allergies Shellfish-derived products and Doxycycline  Family History  Problem Relation Age of Onset  . Diabetes Mother   . Hypertension Mother   . Breast cancer Maternal Grandmother   . Breast cancer Paternal Aunt     Social History Social History   Tobacco Use  . Smoking status: Never Smoker  . Smokeless tobacco: Never Used  Substance Use Topics  . Alcohol use: No    Alcohol/week: 0.0 oz  . Drug use: No    Review of Systems  Constitutional: No fever/chills Eyes: No visual changes. ENT: No sore throat. Cardiovascular: Denies chest pain. Respiratory: Denies shortness of breath. Gastrointestinal: No abdominal pain.  No nausea, no vomiting.  No diarrhea.  No constipation. Genitourinary: Negative for dysuria. Musculoskeletal: Negative for back pain. Skin: Negative for rash. Neurological: Negative for headaches, focal weakness or numbness.   ____________________________________________   PHYSICAL EXAM:  VITAL SIGNS: ED Triage Vitals [06/29/17 1936]    Enc Vitals Group     BP      Pulse      Resp      Temp      Temp src      SpO2      Weight 147 lb (66.7 kg)     Height 5\' 1"  (1.549 m)     Head Circumference      Peak Flow  Pain Score 5     Pain Loc      Pain Edu?      Excl. in [place]?     Constitutional: Alert and oriented. Well appearing and in no acute distress. Eyes: Conjunctivae are normal.  Head: Multiple areas of ecchymosis which the patient describes as being old. Nose: No congestion/rhinnorhea. Mouth/Throat: Mucous membranes are moist.  Neck: No stridor.  No tenderness to the midline cervical spine.  No deformity or step-off.  Patient ranges her head neck freely. Cardiovascular: Normal rate, regular rhythm. Grossly normal heart sounds.  Good peripheral circulation with palpable thrill to the left upper extremity dialysis fistula. Respiratory: Normal respiratory effort.  No retractions. Lungs CTAB. Gastrointestinal: Soft and nontender. No distention. Musculoskeletal: No lower extremity tenderness nor edema.  No joint effusions. Neurologic:  Normal speech and language. No gross focal neurologic deficits are appreciated. Skin:  Skin is warm, dry and intact. No rash noted. Psychiatric: Mood and affect are normal. Speech and behavior are normal.  ____________________________________________   LABS (all labs ordered are listed, but only abnormal results are displayed)  Labs Reviewed  CBC WITH DIFFERENTIAL/PLATELET - Abnormal; Notable for the following components:      Result Value   RBC 2.64 (*)    Hemoglobin 8.0 (*)    HCT 24.6 (*)    RDW 15.5 (*)    Platelets 144 (*)    Lymphs Abs 0.9 (*)    All other components within normal limits  COMPREHENSIVE METABOLIC PANEL - Abnormal; Notable for the following components:   Potassium 3.0 (*)    Chloride 100 (*)    Glucose, Bld 102 (*)    Creatinine, Ser 2.57 (*)    Calcium 8.4 (*)    Albumin 3.2 (*)    AST 59 (*)    Alkaline Phosphatase 194 (*)    GFR calc non Af  Amer 19 (*)    GFR calc Af Amer 22 (*)    All other components within normal limits  PROTIME-INR  GLUCOSE, CAPILLARY  CBG MONITORING, ED  CBG MONITORING, ED  CBG MONITORING, ED  CBG MONITORING, ED   ____________________________________________  EKG  ED ECG REPORT I, Doran Stabler, the attending physician, personally viewed and interpreted this ECG.   Date: 06/29/2017  EKG Time: 1937  Rate: 71  Rhythm: normal sinus rhythm  Axis: Normal  Intervals:none  ST&T Change: T wave inversions in 1 and aVL.  Borderline biphasic T waves in V5 and V6. No significant change from previous on the record. ____________________________________________  RADIOLOGY  No acute finding on the ct brain.  No acute finding on the pelvic xray.  ____________________________________________   PROCEDURES  Procedure(s) performed:   Procedures  Critical Care performed:   ____________________________________________   INITIAL IMPRESSION / ASSESSMENT AND PLAN / ED COURSE  Pertinent labs & imaging results that were available during my care of the patient were reviewed by me and considered in my medical decision making (see chart for details).  DDX: Hypoglycemia, intracranial hemorrhage, hip fracture, pneumothorax, pneumonia, arrhythmia, renal failure As part of my medical decision making, I reviewed the following data within the [facility] Notes from prior ED visits  ----------------------------------------- 8:51 PM on 06/29/2017 -----------------------------------------  Patient at this time with reassuring lab work.  Continues to be at her baseline mental status.  Imaging is also reassuring.  Patient has been able to eat here in the emergency department.  We will continue to observe the patient for 4-hour  observation.  Likely hypoglycemia secondary to patient being late to eating her dinner.  Then, likely the patient fell because of hypoglycemia.  We will continue to observe.   Signed out to Dr. Cherylann Banas.      ____________________________________________   FINAL CLINICAL IMPRESSION(S) / ED DIAGNOSES  Hypoglycemia   NEW MEDICATIONS STARTED DURING THIS VISIT:  This SmartLink is deprecated. Use AVSMEDLIST instead to display the medication list for a patient.   Note:  This document was prepared using Dragon voice recognition software and may include unintentional dictation errors.     Orbie Pyo, MD 06/29/17 2102

## 2017-06-30 LAB — GLUCOSE, CAPILLARY
GLUCOSE-CAPILLARY: 162 mg/dL — AB (ref 65–99)
GLUCOSE-CAPILLARY: 149 mg/dL — AB (ref 65–99)

## 2017-07-03 ENCOUNTER — Other Ambulatory Visit (INDEPENDENT_AMBULATORY_CARE_PROVIDER_SITE_OTHER): Payer: Self-pay | Admitting: Vascular Surgery

## 2017-07-06 ENCOUNTER — Ambulatory Visit: Admission: RE | Admit: 2017-07-06 | Payer: Medicare Other | Source: Ambulatory Visit | Admitting: Vascular Surgery

## 2017-07-06 SURGERY — A/V FISTULAGRAM
Anesthesia: Moderate Sedation | Laterality: Left

## 2017-07-06 MED ORDER — CEFAZOLIN SODIUM-DEXTROSE 1-4 GM/50ML-% IV SOLN: 1.0000 g | Freq: Once | INTRAVENOUS | Status: DC

## 2017-07-18 NOTE — ED Notes (Signed)
Called to give report to [facility] for pt's discharge, but no one answered.

## 2017-07-18 NOTE — ED Notes (Signed)
IV to left FA removed, catheter intact. EMS taking pt back to [facility]. Care. VSs. Pt alert and oriented

## 2017-07-18 NOTE — ED Notes (Addendum)
Pt alert. Report to [facility] with [place] [facility]. All questions answered, understanding was verbalized.  Pt assisted to bedpan. No other needs at this time.

## 2017-07-18 NOTE — ED Notes (Signed)
Unable to call report, tried numerous times, but no one answered the phone.

## 2017-07-18 NOTE — ED Notes (Signed)
Called EMS for transport to [place]

## 2017-07-18 DEATH — deceased

## 2017-08-04 ENCOUNTER — Ambulatory Visit: Payer: Medicare Other | Admitting: Urology

## 2017-08-10 ENCOUNTER — Ambulatory Visit: Payer: Medicare Other | Admitting: Urology

## 2017-08-10 ENCOUNTER — Encounter: Payer: Self-pay | Admitting: Urology

## 2017-09-28 ENCOUNTER — Other Ambulatory Visit (INDEPENDENT_AMBULATORY_CARE_PROVIDER_SITE_OTHER): Payer: Self-pay | Admitting: Vascular Surgery

## 2017-09-28 DIAGNOSIS — N186 End stage renal disease: Secondary | ICD-10-CM

## 2017-09-28 DIAGNOSIS — Z992 Dependence on renal dialysis: Principal | ICD-10-CM

## 2017-09-29 ENCOUNTER — Encounter (INDEPENDENT_AMBULATORY_CARE_PROVIDER_SITE_OTHER): Payer: Medicare Other

## 2017-09-29 ENCOUNTER — Ambulatory Visit (INDEPENDENT_AMBULATORY_CARE_PROVIDER_SITE_OTHER): Payer: Medicaid Other | Admitting: Vascular Surgery

## 2017-10-19 ENCOUNTER — Ambulatory Visit: Payer: Self-pay | Admitting: Internal Medicine
